# Patient Record
Sex: Female | Born: 1995 | Hispanic: No | Smoking: Current every day smoker
Health system: Southern US, Community
[De-identification: ages and names within clinical notes are randomized; demographics above are authoritative.]

## PROBLEM LIST (undated history)

## (undated) DIAGNOSIS — E162 Hypoglycemia, unspecified: Secondary | ICD-10-CM

## (undated) DIAGNOSIS — N289 Disorder of kidney and ureter, unspecified: Secondary | ICD-10-CM

## (undated) DIAGNOSIS — R0600 Dyspnea, unspecified: Secondary | ICD-10-CM

## (undated) DIAGNOSIS — N049 Nephrotic syndrome with unspecified morphologic changes: Secondary | ICD-10-CM

## (undated) DIAGNOSIS — Z8659 Personal history of other mental and behavioral disorders: Secondary | ICD-10-CM

## (undated) DIAGNOSIS — J45909 Unspecified asthma, uncomplicated: Secondary | ICD-10-CM

## (undated) DIAGNOSIS — I509 Heart failure, unspecified: Secondary | ICD-10-CM

## (undated) DIAGNOSIS — F3110 Bipolar disorder, current episode manic without psychotic features, unspecified: Secondary | ICD-10-CM

## (undated) DIAGNOSIS — I10 Essential (primary) hypertension: Secondary | ICD-10-CM

## (undated) DIAGNOSIS — F4310 Post-traumatic stress disorder, unspecified: Secondary | ICD-10-CM

## (undated) DIAGNOSIS — T8859XA Other complications of anesthesia, initial encounter: Secondary | ICD-10-CM

## (undated) DIAGNOSIS — Z992 Dependence on renal dialysis: Secondary | ICD-10-CM

## (undated) DIAGNOSIS — G709 Myoneural disorder, unspecified: Secondary | ICD-10-CM

## (undated) DIAGNOSIS — R011 Cardiac murmur, unspecified: Secondary | ICD-10-CM

## (undated) DIAGNOSIS — N186 End stage renal disease: Secondary | ICD-10-CM

## (undated) DIAGNOSIS — R079 Chest pain, unspecified: Secondary | ICD-10-CM

## (undated) DIAGNOSIS — G4700 Insomnia, unspecified: Secondary | ICD-10-CM

## (undated) HISTORY — PX: OTHER SURGICAL HISTORY: SHX169

## (undated) HISTORY — PX: CHOLECYSTECTOMY: SHX55

## (undated) HISTORY — PX: RENAL BIOPSY: SHX156

---

## 2017-04-08 ENCOUNTER — Emergency Department (HOSPITAL_COMMUNITY)
Admission: EM | Admit: 2017-04-08 | Discharge: 2017-04-09 | Disposition: A | Discharge status: Home / Self Care | Payer: Self-pay | Attending: Emergency Medicine | Admitting: Emergency Medicine

## 2017-04-08 ENCOUNTER — Encounter (HOSPITAL_COMMUNITY): Payer: Self-pay | Admitting: Emergency Medicine

## 2017-04-08 DIAGNOSIS — F17200 Nicotine dependence, unspecified, uncomplicated: Secondary | ICD-10-CM | POA: Insufficient documentation

## 2017-04-08 DIAGNOSIS — F319 Bipolar disorder, unspecified: Secondary | ICD-10-CM

## 2017-04-08 DIAGNOSIS — R45851 Suicidal ideations: Secondary | ICD-10-CM | POA: Insufficient documentation

## 2017-04-08 DIAGNOSIS — F3131 Bipolar disorder, current episode depressed, mild: Secondary | ICD-10-CM | POA: Insufficient documentation

## 2017-04-08 DIAGNOSIS — F1410 Cocaine abuse, uncomplicated: Secondary | ICD-10-CM | POA: Diagnosis present

## 2017-04-08 HISTORY — DX: Hypoglycemia, unspecified: E16.2

## 2017-04-08 HISTORY — DX: Nephrotic syndrome with unspecified morphologic changes: N04.9

## 2017-04-08 HISTORY — DX: Insomnia, unspecified: G47.00

## 2017-04-08 HISTORY — DX: Post-traumatic stress disorder, unspecified: F43.10

## 2017-04-08 HISTORY — DX: Bipolar disorder, current episode manic without psychotic features, unspecified: F31.10

## 2017-04-08 LAB — I-STAT BETA HCG BLOOD, ED (MC, WL, AP ONLY): I-stat hCG, quantitative: <5 m[IU]/mL

## 2017-04-08 LAB — CBC
HCT: 41.2 % (ref 36.0–46.0)
Hemoglobin: 14 g/dL (ref 12.0–15.0)
MCH: 30.7 pg (ref 26.0–34.0)
MCHC: 34 g/dL (ref 30.0–36.0)
MCV: 90.4 fL (ref 78.0–100.0)
PLATELETS: 222 10*3/uL (ref 150–400)
RBC: 4.56 MIL/uL (ref 3.87–5.11)
RDW: 13.2 % (ref 11.5–15.5)
WBC: 14.6 10*3/uL — ABNORMAL HIGH (ref 4.0–10.5)
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
## 2017-04-08 NOTE — ED Triage Notes (Signed)
Pt states she has had thoughts of suicide but states she has no plan to act on them

## 2017-04-09 DIAGNOSIS — F1410 Cocaine abuse, uncomplicated: Secondary | ICD-10-CM | POA: Diagnosis present

## 2017-04-09 DIAGNOSIS — F3131 Bipolar disorder, current episode depressed, mild: Secondary | ICD-10-CM | POA: Diagnosis present

## 2017-04-09 LAB — COMPREHENSIVE METABOLIC PANEL
ALBUMIN: 3.5 g/dL (ref 3.5–5.0)
ALK PHOS: 45 U/L (ref 38–126)
ALT: 19 U/L (ref 14–54)
AST: 23 U/L (ref 15–41)
Anion gap: 8 (ref 5–15)
BUN: 11 mg/dL (ref 6–20)
CALCIUM: 9 mg/dL (ref 8.9–10.3)
CHLORIDE: 107 mmol/L (ref 101–111)
CO2: 25 mmol/L (ref 22–32)
CREATININE: 1.24 mg/dL — AB (ref 0.44–1.00)
GFR calc Af Amer: >60 mL/min (ref >60)
GFR calc non Af Amer: >60 mL/min (ref >60)
Glucose, Bld: 127 mg/dL — ABNORMAL HIGH (ref 65–99)
Potassium: 3.6 mmol/L (ref 3.5–5.1)
SODIUM: 140 mmol/L (ref 135–145)
Total Bilirubin: 0.4 mg/dL (ref 0.3–1.2)
Total Protein: 6.6 g/dL (ref 6.5–8.1)

## 2017-04-09 LAB — RAPID URINE DRUG SCREEN, HOSP PERFORMED
Amphetamines: NOT DETECTED
BENZODIAZEPINES: NOT DETECTED
Barbiturates: NOT DETECTED
COCAINE: POSITIVE — AB
Opiates: NOT DETECTED
TETRAHYDROCANNABINOL: POSITIVE — AB

## 2017-04-09 LAB — ETHANOL: Alcohol, Ethyl (B): <5 mg/dL (ref <5)

## 2017-04-09 LAB — ACETAMINOPHEN LEVEL: Acetaminophen (Tylenol), Serum: <10 ug/mL — ABNORMAL LOW (ref 10–30)

## 2017-04-09 LAB — SALICYLATE LEVEL

## 2017-04-09 MED ORDER — SERTRALINE HCL 50 MG PO TABS
25.0000 mg | ORAL_TABLET | Freq: Every day | ORAL | Status: DC
  Administered 2017-04-09: 25 mg via ORAL
  Filled 2017-04-09: qty 1

## 2017-04-09 MED ORDER — TRAZODONE HCL 100 MG PO TABS: 100.0000 mg | ORAL_TABLET | Freq: Every day | ORAL | 0 refills | Status: DC

## 2017-04-09 MED ORDER — ARIPIPRAZOLE 2 MG PO TABS: 2.0000 mg | ORAL_TABLET | Freq: Every day | ORAL | 0 refills | Status: DC

## 2017-04-09 MED ORDER — ACETAMINOPHEN 325 MG PO TABS: 650.0000 mg | ORAL_TABLET | ORAL | Status: DC | PRN

## 2017-04-09 MED ORDER — TRAZODONE HCL 100 MG PO TABS: 100.0000 mg | ORAL_TABLET | Freq: Every day | ORAL | Status: DC

## 2017-04-09 MED ORDER — HYDROXYZINE HCL 25 MG PO TABS
25.0000 mg | ORAL_TABLET | Freq: Four times a day (QID) | ORAL | Status: DC | PRN
  Administered 2017-04-09: 25 mg via ORAL
  Filled 2017-04-09: qty 1

## 2017-04-09 MED ORDER — HYDROMORPHONE HCL 1 MG/ML IJ SOLN: 2.0000 mg | INTRAMUSCULAR | Status: DC

## 2017-04-09 MED ORDER — ONDANSETRON HCL 4 MG PO TABS: 4.0000 mg | ORAL_TABLET | Freq: Three times a day (TID) | ORAL | Status: DC | PRN

## 2017-04-09 MED ORDER — ARIPIPRAZOLE 2 MG PO TABS
2.0000 mg | ORAL_TABLET | Freq: Every day | ORAL | Status: DC
  Administered 2017-04-09: 2 mg via ORAL
  Filled 2017-04-09: qty 1

## 2017-04-09 MED ORDER — ALUM & MAG HYDROXIDE-SIMETH 200-200-20 MG/5ML PO SUSP: 30.0000 mL | ORAL | Status: DC | PRN

## 2017-04-09 MED ORDER — IBUPROFEN 200 MG PO TABS: 600.0000 mg | ORAL_TABLET | Freq: Three times a day (TID) | ORAL | Status: DC | PRN

## 2017-04-09 MED ORDER — SERTRALINE HCL 25 MG PO TABS: 25.0000 mg | ORAL_TABLET | Freq: Every day | ORAL | 0 refills | Status: DC

## 2017-04-09 MED ORDER — ZOLPIDEM TARTRATE 5 MG PO TABS: 5.0000 mg | ORAL_TABLET | Freq: Every evening | ORAL | Status: DC | PRN

## 2017-04-09 MED ORDER — HYDROXYZINE HCL 25 MG PO TABS: 25.0000 mg | ORAL_TABLET | Freq: Four times a day (QID) | ORAL | 0 refills | Status: DC | PRN

## 2017-04-09 NOTE — ED Notes (Signed)
Bed: [facility]ALB Expected date:  Expected time:  Means of arrival:  Comments: 

## 2017-04-09 NOTE — BHH Counselor (Signed)
@  0400: Clinician attempted to re-engage the pt in the TTS consult. Pt was still unable to rouse. Updated Dr. Roxanne Mins.   Edd Fabian, MS, [facility] Triage Specialist [phone]

## 2017-04-09 NOTE — Discharge Instructions (Addendum)
For your ongoing mental health needs, you are advised to follow up with [facility].  New and returning patients are seen at their walk-in clinic.  Walk-in hours are Monday - Friday from 8:00 am - 3:00 pm.  Walk-in patients are seen on a first come, first served basis.  Try to arrive as early as possible for he best chance of being seen the same day:       [facility]      [address], [place] [address]      [phone]  For your shelter needs, contact the following service providers:       [facility]      [place], [place] [address]      [phone]       [facility]      [place]      [place], [place] [address]      [phone]  For day shelter and other supportive services for the homeless, contact the [facility]):       [facility]      [place], [place] [address]      [phone]  For transitional housing, contact one of the following agencies.  They provide longer term housing than a shelter, but there is an application process:       [facility]      [address]      [place], [place] [address]      [phone]       [facility] [address], [place] [address]      [phone]

## 2017-04-09 NOTE — ED Provider Notes (Signed)
[facility] DEPT Provider Note   CSN: [id number] Arrival date & time: 04/08/17  2301     History   Chief Complaint Chief Complaint  Patient presents with  . Medical Clearance    HPI Natasha Long is a 21 y.o. female.  HPI Patient presents to the emergency department with worsening bipolar disorder since she has not been taking her medicines the last few months.  Patient states that she feels like she is wanting to inflict physical harm to help with her mental pain.  The patient states that she has not had any homicidal thoughts.  The patient states that nothing seems make the condition better or worse.  She states that she has had similar problems when she stopped taking her medications in the past.  Patient denies hallucinationsThe patient denies chest pain, shortness of breath, headache,blurred vision, neck pain, fever, cough, weakness, numbness, dizziness, anorexia, edema, abdominal pain, nausea, vomiting, diarrhea, rash, back pain, dysuria, hematemesis, bloody stool, near syncope, or syncope. Past Medical History:  Diagnosis Date  . Bipolar affective, manic ([place])   . Hypoglycemia   . Insomnia   . Nephrotic syndrome   . PTSD (post-traumatic stress disorder)     There are no active problems to display for this patient.   Past Surgical History:  Procedure Laterality Date  . CHOLECYSTECTOMY    . extraction of wisdom teeth      OB History    No data available       Home Medications    Prior to Admission medications   Not on File    Family History Family History  Problem Relation Age of Onset  . Diabetes Other   . Hypertension Other     Social History Social History  Substance Use Topics  . Smoking status: Current Every Day Smoker  . Smokeless tobacco: Never Used  . Alcohol use No     Allergies   Prednisone   Review of Systems Review of Systems  All other systems negative except as documented in the HPI. All pertinent positives and  negatives as reviewed in the HPI. Physical Exam Updated Vital Signs BP 118/77 (BP Location: Left Arm)   Pulse 87   Temp 98.6 F (37 C) (Oral)   Resp 18   LMP 03/15/2017 (Exact Date)   SpO2 99%   Physical Exam  Constitutional: She is oriented to person, place, and time. She appears well-developed and well-nourished. No distress.  HENT:  Head: Normocephalic and atraumatic.  Mouth/Throat: Oropharynx is clear and moist.  Eyes: Pupils are equal, round, and reactive to light.  Neck: Normal range of motion. Neck supple.  Cardiovascular: Normal rate, regular rhythm and normal heart sounds.  Exam reveals no gallop and no friction rub.   No murmur heard. Pulmonary/Chest: Effort normal and breath sounds normal. No respiratory distress. She has no wheezes.  Abdominal: Soft. Bowel sounds are normal. She exhibits no distension. There is no tenderness.  Neurological: She is alert and oriented to person, place, and time. She exhibits normal muscle tone. Coordination normal.  Skin: Skin is warm and dry. Capillary refill takes less than 2 seconds. No rash noted. No erythema.  Psychiatric: She has a normal mood and affect. Her behavior is normal.  Nursing note and vitals reviewed.    ED Treatments / Results  Labs (all labs ordered are listed, but only abnormal results are displayed) Labs Reviewed  CBC - Abnormal; Notable for the following:       Result Value  WBC 14.6 (*)    All other components within normal limits  COMPREHENSIVE METABOLIC PANEL  ETHANOL  SALICYLATE LEVEL  ACETAMINOPHEN LEVEL  RAPID URINE DRUG SCREEN, HOSP PERFORMED  I-STAT BETA HCG BLOOD, ED ([facility], [facility], [facility] ONLY)    EKG  EKG Interpretation None       Radiology No results found.  Procedures Procedures (including critical care time)  Medications Ordered in ED Medications - No data to display   Initial Impression / Assessment and Plan / ED Course  I have reviewed the triage vital signs and the nursing  notes.  Pertinent labs & imaging results that were available during my care of the patient were reviewed by me and considered in my medical decision making (see chart for details).     Patient will need TTS assessment for passive suicidal ideation, along with her bipolar disorder  Final Clinical Impressions(s) / ED Diagnoses   Final diagnoses:  None    New Prescriptions New Prescriptions   No medications on file     Dalia Heading, PA-C 04/09/17 [place], [place], PA-C 87/19/59 [id number]    Delora Fuel, MD 85/50/15 [phone]

## 2017-04-09 NOTE — BH Assessment (Signed)
[facility] Assessment Progress Note  Per Corena Pilgrim, MD, this pt does not require psychiatric hospitalization at this time.  Pt is to be discharged from [facility] with recommendation to follow up with [facility].  She is also to be provided with information about supportive services for the homeless.  These have been included in pt's discharge instructions.  [name] nurse has been notified.  Jalene Mullet, [facility] Triage Specialist [phone]

## 2017-04-09 NOTE — ED Notes (Signed)
Pt has been moved to hall B and 3 bags of belongings were placed in cabinet for [place] B.

## 2017-04-09 NOTE — BHH Counselor (Signed)
Clinician attempted to complete the TTS consult however pt could not be roused. Clinician asked the pt's nurse to assist however pt continue to sleep. Clinician will attempt the assessment later.   Edd Fabian, MS, [facility], [facility] Triage Specialist [phone]
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
## 2017-04-09 NOTE — ED Notes (Signed)
Discharge instructions reviewed with patient. Patient verbalized understanding. Patient given clothes from chaplain.

## 2017-05-02 ENCOUNTER — Encounter (HOSPITAL_COMMUNITY): Payer: Self-pay | Admitting: Emergency Medicine

## 2017-05-02 ENCOUNTER — Emergency Department (HOSPITAL_COMMUNITY): Payer: Self-pay

## 2017-05-02 ENCOUNTER — Emergency Department (HOSPITAL_COMMUNITY)
Admission: EM | Admit: 2017-05-02 | Discharge: 2017-05-03 | Disposition: A | Discharge status: Home / Self Care | Payer: Self-pay | Attending: Emergency Medicine | Admitting: Emergency Medicine

## 2017-05-02 DIAGNOSIS — S0511XA Contusion of eyeball and orbital tissues, right eye, initial encounter: Secondary | ICD-10-CM | POA: Insufficient documentation

## 2017-05-02 DIAGNOSIS — Z79899 Other long term (current) drug therapy: Secondary | ICD-10-CM | POA: Insufficient documentation

## 2017-05-02 DIAGNOSIS — S0512XA Contusion of eyeball and orbital tissues, left eye, initial encounter: Secondary | ICD-10-CM | POA: Insufficient documentation

## 2017-05-02 DIAGNOSIS — Y939 Activity, unspecified: Secondary | ICD-10-CM | POA: Insufficient documentation

## 2017-05-02 DIAGNOSIS — Y999 Unspecified external cause status: Secondary | ICD-10-CM | POA: Insufficient documentation

## 2017-05-02 DIAGNOSIS — Y929 Unspecified place or not applicable: Secondary | ICD-10-CM | POA: Insufficient documentation

## 2017-05-02 DIAGNOSIS — S060X0A Concussion without loss of consciousness, initial encounter: Secondary | ICD-10-CM | POA: Insufficient documentation

## 2017-05-02 DIAGNOSIS — F17200 Nicotine dependence, unspecified, uncomplicated: Secondary | ICD-10-CM | POA: Insufficient documentation

## 2017-05-02 LAB — URINALYSIS, ROUTINE W REFLEX MICROSCOPIC
Bilirubin Urine: NEGATIVE
GLUCOSE, UA: NEGATIVE mg/dL
KETONES UR: NEGATIVE mg/dL
Leukocytes, UA: NEGATIVE
Nitrite: POSITIVE — AB
PH: 5 (ref 5.0–8.0)
Specific Gravity, Urine: 1.031 — ABNORMAL HIGH (ref 1.005–1.030)

## 2017-05-02 LAB — RAPID URINE DRUG SCREEN, HOSP PERFORMED
AMPHETAMINES: NOT DETECTED
BENZODIAZEPINES: NOT DETECTED
Barbiturates: NOT DETECTED
Cocaine: NOT DETECTED
OPIATES: NOT DETECTED
TETRAHYDROCANNABINOL: POSITIVE — AB

## 2017-05-02 LAB — PREGNANCY, URINE: PREG TEST UR: NEGATIVE
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
## 2017-05-02 NOTE — ED Notes (Signed)
Pt aware we need a urine specimen. 

## 2017-05-03 MED ORDER — ARIPIPRAZOLE 2 MG PO TABS: 2.0000 mg | ORAL_TABLET | Freq: Every day | ORAL | 0 refills | Status: DC

## 2017-05-03 MED ORDER — SERTRALINE HCL 25 MG PO TABS: 25.0000 mg | ORAL_TABLET | Freq: Every day | ORAL | 0 refills | Status: DC

## 2017-05-03 MED ORDER — HYDROXYZINE HCL 25 MG PO TABS: 25.0000 mg | ORAL_TABLET | Freq: Four times a day (QID) | ORAL | 0 refills | Status: DC

## 2017-05-03 NOTE — ED Notes (Signed)
Per Dr. Alvino Chapel, pt is to board in the ED until social work can see pt in the am.

## 2017-05-03 NOTE — ED Notes (Signed)
ED Provider at bedside. 

## 2017-05-03 NOTE — Discharge Instructions (Addendum)
Restart your prescription medication. Follow-up with community mental health resources.

## 2017-05-03 NOTE — ED Notes (Signed)
Social Worker at bedside.
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
## 2017-05-03 NOTE — ED Provider Notes (Signed)
Patient reassessed by Education officer, museum. It was recommended that she restart her medications and follow-up with community mental health. Prescriptions written for Abilify, trazodone, hydroxyzine, Zoloft. Patient was lucid at discharge.  No evidence of psychosis.   Nat Christen, MD 05/03/17 1020

## 2017-05-05 LAB — URINE CULTURE: Culture: >=100000 — AB

## 2017-05-06 ENCOUNTER — Telehealth: Payer: Self-pay | Admitting: *Deleted
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
## 2017-05-06 NOTE — Telephone Encounter (Signed)
Post ED Visit - Positive Culture Follow-up  Culture report reviewed by antimicrobial stewardship pharmacist:  []  Elenor Quinones, Pharm.D. []  Heide Guile, Pharm.D., BCPS AQ-ID []  Parks Neptune, Pharm.D., BCPS []  Alycia Rossetti, Pharm.D., BCPS []  [place], [place].D., BCPS, AAHIVP []  Legrand Como, Pharm.D., BCPS, AAHIVP []  Salome Arnt, PharmD, BCPS []  Dimitri Ped, PharmD, BCPS []  Vincenza Hews, PharmD, BCPS  Positive urine culture, chart reviewed by Quintella Reichert, [facility] No urinary symptoms and no further patient follow-up is required at this time.  Harlon Flor [facility] 05/06/2017, 10:48 AM

## 2017-05-07 ENCOUNTER — Encounter (HOSPITAL_COMMUNITY): Payer: Self-pay | Admitting: Emergency Medicine

## 2017-05-07 ENCOUNTER — Emergency Department (HOSPITAL_COMMUNITY)
Admission: EM | Admit: 2017-05-07 | Discharge: 2017-05-08 | Disposition: A | Discharge status: Home / Self Care | Payer: Medicaid - Out of State | Attending: Emergency Medicine | Admitting: Emergency Medicine

## 2017-05-07 DIAGNOSIS — R609 Edema, unspecified: Secondary | ICD-10-CM

## 2017-05-07 DIAGNOSIS — R809 Proteinuria, unspecified: Secondary | ICD-10-CM | POA: Insufficient documentation

## 2017-05-07 DIAGNOSIS — F17200 Nicotine dependence, unspecified, uncomplicated: Secondary | ICD-10-CM | POA: Insufficient documentation

## 2017-05-07 DIAGNOSIS — R600 Localized edema: Secondary | ICD-10-CM | POA: Insufficient documentation

## 2017-05-07 DIAGNOSIS — Z79899 Other long term (current) drug therapy: Secondary | ICD-10-CM | POA: Insufficient documentation

## 2017-05-07 DIAGNOSIS — N390 Urinary tract infection, site not specified: Secondary | ICD-10-CM

## 2017-05-07 LAB — URINALYSIS, ROUTINE W REFLEX MICROSCOPIC
Bilirubin Urine: NEGATIVE
GLUCOSE, UA: NEGATIVE mg/dL
Ketones, ur: NEGATIVE mg/dL
NITRITE: NEGATIVE
Protein, ur: 100 mg/dL — AB
SPECIFIC GRAVITY, URINE: 1.019 (ref 1.005–1.030)
pH: 6 (ref 5.0–8.0)

## 2017-05-07 LAB — POC URINE PREG, ED: Preg Test, Ur: NEGATIVE

## 2017-05-07 MED ORDER — NITROFURANTOIN MONOHYD MACRO 100 MG PO CAPS
100.0000 mg | ORAL_CAPSULE | Freq: Once | ORAL | Status: AC
  Administered 2017-05-08: 100 mg via ORAL
  Filled 2017-05-07: qty 1

## 2017-05-07 NOTE — ED Triage Notes (Signed)
Per EMS, pt complains of right flank pain x 3 days and dysuria. Pt also complains of swelling feet.   BP 142.84 HR 78 CBG 107

## 2017-05-07 NOTE — ED Provider Notes (Signed)
[facility] DEPT Provider Note   CSN: [id number] Arrival date & time: 05/07/17  2257  By signing my name below, I, Ny'Kea Lewis, attest that this documentation has been prepared under the direction and in the presence of Delora Fuel, MD. Electronically Signed: Lise Auer, ED Scribe. 05/08/17. 12:03 AM.  History   Chief Complaint Chief Complaint  Patient presents with  . Flank Pain   The history is provided by the patient. No language interpreter was used.   HPI Comments: Natasha Long is a 21 y.o. female brought in by ambulance, who presents to the Emergency Department complaining of gradually worsening, sharp right flank pain that began three days ago, she rates the severity of her pain a 6/10. Pt notes associated BLE edema, nausea, straining to urinate, and urinary frequency and urgency, . Pt reports three days ago she began to feel sharp right flank pain and she noted BLE edema. She notes she was dx with nephrotic syndrome but has not been followed by a nephrologist since she was 21 years old. She notes her pain is worsened with twisting positional movement. Denies fever, chills, diaphoresis, emesis, or myalgias.   Past Medical History:  Diagnosis Date  . Bipolar affective, manic ([place])   . Hypoglycemia   . Insomnia   . Nephrotic syndrome   . PTSD (post-traumatic stress disorder)     Patient Active Problem List   Diagnosis Date Noted  . Bipolar affective disorder, depressed, mild ([place]) 04/09/2017  . Cocaine abuse 04/09/2017    Past Surgical History:  Procedure Laterality Date  . CHOLECYSTECTOMY    . extraction of wisdom teeth      OB History    No data available      Home Medications    Prior to Admission medications   Medication Sig Start Date End Date Taking? Authorizing Provider  ARIPiprazole (ABILIFY) 2 MG tablet Take 1 tablet (2 mg total) by mouth daily. 05/03/17   Nat Christen, MD  hydrOXYzine (ATARAX/VISTARIL) 25 MG tablet Take 1 tablet (25 mg total) by  mouth every 6 (six) hours. 05/03/17   Nat Christen, MD  sertraline (ZOLOFT) 25 MG tablet Take 1 tablet (25 mg total) by mouth daily. 05/03/17   Nat Christen, MD  traZODone (DESYREL) 100 MG tablet Take 1 tablet (100 mg total) by mouth at bedtime. 04/09/17   Patrecia Pour, NP    Family History Family History  Problem Relation Age of Onset  . Diabetes Other   . Hypertension Other    Social History Social History  Substance Use Topics  . Smoking status: Current Every Day Smoker  . Smokeless tobacco: Never Used  . Alcohol use No   Allergies   Prednisone  Review of Systems Review of Systems  Constitutional: Negative for chills, diaphoresis and fever.  Cardiovascular: Positive for leg swelling.  Gastrointestinal: Positive for nausea. Negative for vomiting.  Genitourinary: Positive for difficulty urinating (straining to urinate. ), flank pain and frequency.  Musculoskeletal: Negative for myalgias.  All other systems reviewed and are negative.  Physical Exam Updated Vital Signs BP 134/79 (BP Location: Right Arm)   Pulse 69   Temp 98.1 F (36.7 C) (Oral)   Resp 16   Ht 5\' 8"  (1.727 m)   Wt 250 lb (113.4 kg)   LMP 03/30/2017   SpO2 100%   BMI 38.01 kg/m   Physical Exam  Constitutional: She is oriented to person, place, and time. She appears well-developed and well-nourished.  HENT:  Head: Normocephalic and  atraumatic.  Eyes: EOM are normal. Pupils are equal, round, and reactive to light.  Neck: Normal range of motion. Neck supple. No JVD present.  Cardiovascular: Normal rate, regular rhythm and normal heart sounds.   No murmur heard. Pulmonary/Chest: Effort normal and breath sounds normal. She has no wheezes. She has no rales. She exhibits no tenderness.  Abdominal: Soft. Bowel sounds are normal. She exhibits no distension and no mass. There is no tenderness.  Musculoskeletal: Normal range of motion. She exhibits no edema.  2-3+ pre-tibial edema.   Lymphadenopathy:    She has  no cervical adenopathy.  Neurological: She is alert and oriented to person, place, and time. No cranial nerve deficit. She exhibits normal muscle tone. Coordination normal.  Skin: Skin is warm and dry. No rash noted.  Psychiatric: She has a normal mood and affect. Her behavior is normal. Judgment and thought content normal.  Nursing note and vitals reviewed.  ED Treatments / Results  DIAGNOSTIC STUDIES: Oxygen Saturation is 100% on RA, normal by my interpretation.   COORDINATION OF CARE: 11:54 PM-Discussed next steps with pt. Pt verbalized understanding and is agreeable with the plan.   Labs (all labs ordered are listed, but only abnormal results are displayed) Labs Reviewed  URINALYSIS, ROUTINE W REFLEX MICROSCOPIC - Abnormal; Notable for the following:       Result Value   Hgb urine dipstick MODERATE (*)    Protein, ur 100 (*)    Leukocytes, UA SMALL (*)    Bacteria, UA RARE (*)    Squamous Epithelial / LPF 0-5 (*)    All other components within normal limits  POC URINE PREG, ED    Procedures Procedures (including critical care time)  Medications Ordered in ED Medications  nitrofurantoin (macrocrystal-monohydrate) (MACROBID) capsule 100 mg (100 mg Oral Given 05/08/17 0018)     Initial Impression / Assessment and Plan / ED Course  I have reviewed the triage vital signs and the nursing notes.  Pertinent lab results that were available during my care of the patient were reviewed by me and considered in my medical decision making (see chart for details).  Peripheral edema in patient with history of nephrotic syndrome. This is worrisome for recurrence. Old records are reviewed, and she was in the ED June 3 and had a urine culture that was positive for Escherichia coli which was sensitive to all tested antibiotics. She is given a dose of nitrofurantoin. Urinalysis today only shows protein at 100 mg/dL. Metabolic panel shows albumin only slightly low at 3.4. Renal function is  normal. I do not believe that this represents nephrotic syndrome. Exact cause for her edema is not clear. She is discharged with prescription for furosemide and nitrofurantoin. Also, of note, she has moved here from [place] and will need to have her Medicaid reassigned. She is given Doctor, hospital.  Final Clinical Impressions(s) / ED Diagnoses   Final diagnoses:  Peripheral edema  Urinary tract infection without hematuria, site unspecified  Proteinuria, unspecified type    New Prescriptions New Prescriptions   FUROSEMIDE (LASIX) 20 MG TABLET    Take 1 tablet (20 mg total) by mouth daily as needed for edema.   NITROFURANTOIN, MACROCRYSTAL-MONOHYDRATE, (MACROBID) 100 MG CAPSULE    Take 1 capsule (100 mg total) by mouth 2 (two) times daily. X 7 days   I personally performed the services described in this documentation, which was scribed in my presence. The recorded information has been reviewed and is accurate.  Delora Fuel, MD 68/25/74 [phone]

## 2017-05-08 LAB — CBC WITH DIFFERENTIAL/PLATELET
BASOS ABS: 0 10*3/uL (ref 0.0–0.1)
Basophils Relative: 0 %
Eosinophils Absolute: 0.3 10*3/uL (ref 0.0–0.7)
Eosinophils Relative: 2 %
HEMATOCRIT: 39.2 % (ref 36.0–46.0)
HEMOGLOBIN: 13.6 g/dL (ref 12.0–15.0)
LYMPHS PCT: 33 %
Lymphs Abs: 3.5 10*3/uL (ref 0.7–4.0)
MCH: 30.7 pg (ref 26.0–34.0)
MCHC: 34.7 g/dL (ref 30.0–36.0)
MCV: 88.5 fL (ref 78.0–100.0)
MONO ABS: 0.8 10*3/uL (ref 0.1–1.0)
MONOS PCT: 7 %
NEUTROS ABS: 6.1 10*3/uL (ref 1.7–7.7)
NEUTROS PCT: 58 %
Platelets: 195 10*3/uL (ref 150–400)
RBC: 4.43 MIL/uL (ref 3.87–5.11)
RDW: 13.2 % (ref 11.5–15.5)
WBC: 10.7 10*3/uL — ABNORMAL HIGH (ref 4.0–10.5)

## 2017-05-08 LAB — COMPREHENSIVE METABOLIC PANEL
ALBUMIN: 3.4 g/dL — AB (ref 3.5–5.0)
ALT: 21 U/L (ref 14–54)
ANION GAP: 7 (ref 5–15)
AST: 17 U/L (ref 15–41)
Alkaline Phosphatase: 49 U/L (ref 38–126)
BUN: 16 mg/dL (ref 6–20)
CO2: 28 mmol/L (ref 22–32)
Calcium: 9.2 mg/dL (ref 8.9–10.3)
Chloride: 106 mmol/L (ref 101–111)
Creatinine, Ser: 0.97 mg/dL (ref 0.44–1.00)
GFR calc Af Amer: >60 mL/min (ref >60)
GFR calc non Af Amer: >60 mL/min (ref >60)
GLUCOSE: 82 mg/dL (ref 65–99)
POTASSIUM: 3.7 mmol/L (ref 3.5–5.1)
SODIUM: 141 mmol/L (ref 135–145)
TOTAL PROTEIN: 6.5 g/dL (ref 6.5–8.1)
Total Bilirubin: 0.3 mg/dL (ref 0.3–1.2)

## 2017-05-08 MED ORDER — FUROSEMIDE 20 MG PO TABS: 20.0000 mg | ORAL_TABLET | Freq: Every day | ORAL | 0 refills | Status: DC | PRN

## 2017-05-08 MED ORDER — NITROFURANTOIN MONOHYD MACRO 100 MG PO CAPS: 100.0000 mg | ORAL_CAPSULE | Freq: Two times a day (BID) | ORAL | 0 refills | Status: DC

## 2017-05-18 ENCOUNTER — Encounter (HOSPITAL_COMMUNITY): Payer: Self-pay | Admitting: Emergency Medicine

## 2017-05-18 ENCOUNTER — Emergency Department (HOSPITAL_COMMUNITY)
Admission: EM | Admit: 2017-05-18 | Discharge: 2017-05-19 | Disposition: A | Discharge status: Home / Self Care | Payer: Medicaid - Out of State | Attending: Emergency Medicine | Admitting: Emergency Medicine

## 2017-05-18 ENCOUNTER — Emergency Department (HOSPITAL_COMMUNITY): Payer: Medicaid - Out of State

## 2017-05-18 DIAGNOSIS — W101XXA Fall (on)(from) sidewalk curb, initial encounter: Secondary | ICD-10-CM | POA: Insufficient documentation

## 2017-05-18 DIAGNOSIS — S93402A Sprain of unspecified ligament of left ankle, initial encounter: Secondary | ICD-10-CM | POA: Insufficient documentation

## 2017-05-18 DIAGNOSIS — Y929 Unspecified place or not applicable: Secondary | ICD-10-CM | POA: Insufficient documentation

## 2017-05-18 DIAGNOSIS — Y9301 Activity, walking, marching and hiking: Secondary | ICD-10-CM | POA: Insufficient documentation

## 2017-05-18 DIAGNOSIS — Y998 Other external cause status: Secondary | ICD-10-CM | POA: Insufficient documentation

## 2017-05-18 DIAGNOSIS — I10 Essential (primary) hypertension: Secondary | ICD-10-CM | POA: Insufficient documentation

## 2017-05-18 DIAGNOSIS — F17210 Nicotine dependence, cigarettes, uncomplicated: Secondary | ICD-10-CM | POA: Insufficient documentation

## 2017-05-18 DIAGNOSIS — Z79899 Other long term (current) drug therapy: Secondary | ICD-10-CM | POA: Insufficient documentation

## 2017-05-18 HISTORY — DX: Essential (primary) hypertension: I10
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
## 2017-05-19 MED ORDER — IBUPROFEN 200 MG PO TABS
600.0000 mg | ORAL_TABLET | Freq: Once | ORAL | Status: AC
  Administered 2017-05-19: 600 mg via ORAL
  Filled 2017-05-19: qty 3

## 2017-05-19 MED ORDER — IBUPROFEN 600 MG PO TABS: 600.0000 mg | ORAL_TABLET | Freq: Four times a day (QID) | ORAL | 0 refills | Status: DC | PRN
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
## 2017-05-19 NOTE — Discharge Instructions (Signed)
Ibuprofen as needed for pain. Ice and alleviate for additional pain relief. If your symptoms persist without improvement in one week, please follow up with your primary care doctor.    TREATMENT  Rest, ice, elevation, and compression are the basic modes of treatment.    HOME CARE INSTRUCTIONS  Apply ice to the sore area for 15 to 20 minutes, 3 to 4 times per day. Do this while you are awake for the first 2 days. This can be stopped when the swelling goes away. Put the ice in a plastic bag and place a towel between the bag of ice and your skin.  Keep your leg elevated when possible to lessen swelling.  You may take off your ankle stabilizer at night and to take a shower or bath. Wiggle your toes several times per day if you are able.  ACTIVITY:            - Weight bearing as tolerated - if you can do it, do it. If it hurts, stop.             - Exercises should be limited to pain free range of motion            - Can start mobilization by tracing the alphabet with your foot in the air.       SEEK MEDICAL CARE IF:  You have an increase in bruising, swelling, or pain.  Your toes feel cold.  Pain relief is not achieved with medications.  EMERGENCY:: Your toes are numb or blue or you have severe pain.   MAKE SURE YOU:  Understand these instructions.  Will watch your condition.  Will get help right away if you are not doing well or get worse

## 2017-05-25 MED FILL — NITROFURANTOIN MONO-MCR 100: 100 | 7 days supply | Qty: 14 | Fill #0

## 2017-05-25 MED FILL — IBUPROFEN 600 MG TABLET: 600 | 8 days supply | Qty: 30 | Fill #0

## 2017-05-25 MED FILL — FUROSEMIDE 20 MG TABLET: 20 | 15 days supply | Qty: 15 | Fill #0

## 2017-07-11 ENCOUNTER — Emergency Department (HOSPITAL_COMMUNITY)
Admission: EM | Admit: 2017-07-11 | Discharge: 2017-07-11 | Disposition: A | Discharge status: Home / Self Care | Payer: Medicaid - Out of State | Attending: Emergency Medicine | Admitting: Emergency Medicine

## 2017-07-11 ENCOUNTER — Emergency Department (HOSPITAL_COMMUNITY): Payer: Medicaid - Out of State

## 2017-07-11 ENCOUNTER — Encounter (HOSPITAL_COMMUNITY): Payer: Self-pay

## 2017-07-11 DIAGNOSIS — Y929 Unspecified place or not applicable: Secondary | ICD-10-CM | POA: Insufficient documentation

## 2017-07-11 DIAGNOSIS — S82892A Other fracture of left lower leg, initial encounter for closed fracture: Secondary | ICD-10-CM | POA: Insufficient documentation

## 2017-07-11 DIAGNOSIS — Y9389 Activity, other specified: Secondary | ICD-10-CM | POA: Insufficient documentation

## 2017-07-11 DIAGNOSIS — F17210 Nicotine dependence, cigarettes, uncomplicated: Secondary | ICD-10-CM | POA: Insufficient documentation

## 2017-07-11 DIAGNOSIS — W1842XA Slipping, tripping and stumbling without falling due to stepping into hole or opening, initial encounter: Secondary | ICD-10-CM | POA: Insufficient documentation

## 2017-07-11 DIAGNOSIS — Z79899 Other long term (current) drug therapy: Secondary | ICD-10-CM | POA: Insufficient documentation

## 2017-07-11 DIAGNOSIS — I10 Essential (primary) hypertension: Secondary | ICD-10-CM | POA: Insufficient documentation

## 2017-07-11 DIAGNOSIS — Y999 Unspecified external cause status: Secondary | ICD-10-CM | POA: Insufficient documentation

## 2017-07-11 MED ORDER — TRAMADOL HCL 50 MG PO TABS: 50.0000 mg | ORAL_TABLET | Freq: Four times a day (QID) | ORAL | 0 refills | Status: DC | PRN

## 2017-07-11 MED ORDER — TRAMADOL HCL 50 MG PO TABS
50.0000 mg | ORAL_TABLET | Freq: Once | ORAL | Status: AC
  Administered 2017-07-11: 50 mg via ORAL
  Filled 2017-07-11: qty 1

## 2017-07-11 MED ORDER — DICLOFENAC SODIUM 50 MG PO TBEC: 50.0000 mg | DELAYED_RELEASE_TABLET | Freq: Two times a day (BID) | ORAL | 0 refills | Status: DC

## 2017-07-11 NOTE — ED Triage Notes (Signed)
Pt BIB [facility] c/o L ankle pain following twisting her ankle yesterday. Pt is able to bear weight on ankle, but endorses swelling. PMS and pulses intact. EMS splinted ankle. A&Ox4.

## 2017-07-11 NOTE — ED Provider Notes (Signed)
[facility] DEPT Provider Note   CSN: [id number] Arrival date & time: 07/11/17  1323   By signing my name below, I, Eunice Blase, attest that this documentation has been prepared under the direction and in the presence of [facility], [place]. Electronically signed, Eunice Blase, ED Scribe. 07/11/17. 1:36 PM.   History   Chief Complaint Chief Complaint  Patient presents with  . Ankle Injury   The history is provided by the patient and medical records. No language interpreter was used.    Natasha Long is a 21 y.o. female who arrived via EMS to the Emergency Department concerning L ankle pain onset yesterday. Associated swelling that is worsening gradually. She states she rolled the ankle stepping in a pothole and she heard multiple popping sounds during the injury. She describes constant, moderate throbbing and aching worse with ambulation. Ibuprofen has provided minimal relief at home. Pt states she rolls the ankle > x 2 yearly, and notes she has weak ankles. No other complaints at this time.   Past Medical History:  Diagnosis Date  . Bipolar affective, manic ([place])   . Hypertension   . Hypoglycemia   . Insomnia   . Nephrotic syndrome   . PTSD (post-traumatic stress disorder)     Patient Active Problem List   Diagnosis Date Noted  . Bipolar affective disorder, depressed, mild ([place]) 04/09/2017  . Cocaine abuse 04/09/2017    Past Surgical History:  Procedure Laterality Date  . CHOLECYSTECTOMY    . extraction of wisdom teeth    . RENAL BIOPSY      OB History    No data available       Home Medications    Prior to Admission medications   Medication Sig Start Date End Date Taking? Authorizing Provider  etonogestrel (NEXPLANON) 68 MG IMPL implant 1 each by Subdermal route once.   Yes [provider]  hydrOXYzine (ATARAX/VISTARIL) 25 MG tablet Take 1 tablet (25 mg total) by mouth every 6 (six) hours. Patient taking differently: Take 25 mg by mouth every  6 (six) hours as needed for anxiety.  05/03/17  Yes Nat Christen, MD  lisinopril (PRINIVIL,ZESTRIL) 2.5 MG tablet Take 2.5 mg by mouth daily.   Yes [provider]  ARIPiprazole (ABILIFY) 2 MG tablet Take 1 tablet (2 mg total) by mouth daily. 05/03/17   Nat Christen, MD  diclofenac (VOLTAREN) 50 MG EC tablet Take 1 tablet (50 mg total) by mouth 2 (two) times daily. 07/11/17   Ashley Murrain, NP  sertraline (ZOLOFT) 25 MG tablet Take 1 tablet (25 mg total) by mouth daily. Patient not taking: Reported on 07/11/2017 05/03/17   Nat Christen, MD  traMADol (ULTRAM) 50 MG tablet Take 1 tablet (50 mg total) by mouth every 6 (six) hours as needed. 07/11/17   Ashley Murrain, NP  traZODone (DESYREL) 100 MG tablet Take 1 tablet (100 mg total) by mouth at bedtime. Patient not taking: Reported on 07/11/2017 04/09/17   Patrecia Pour, NP    Family History Family History  Problem Relation Age of Onset  . Diabetes Other   . Hypertension Other     Social History Social History  Substance Use Topics  . Smoking status: Current Every Day Smoker    Types: Cigarettes  . Smokeless tobacco: Never Used  . Alcohol use No     Allergies   Prednisone; Prozac [fluoxetine hcl]; and Wellbutrin [bupropion]   Review of Systems Review of Systems  Constitutional: Negative for fever.  HENT: Negative.   Gastrointestinal: Negative for nausea and vomiting.  Musculoskeletal: Positive for arthralgias and joint swelling.  Skin: Negative for color change and wound.  Neurological: Negative for weakness and numbness.  Psychiatric/Behavioral: The patient is not nervous/anxious.      Physical Exam Updated Vital Signs BP (!) 124/59 (BP Location: Left Arm)   Pulse 65   Temp 99.3 F (37.4 C) (Oral)   Resp 15   Ht 5\' 8"  (1.727 m)   Wt 250 lb (113.4 kg)   LMP 07/07/2017   SpO2 100%   BMI 38.01 kg/m   Physical Exam  Constitutional: She appears well-developed and well-nourished. No distress.  HENT:  Head:  Normocephalic and atraumatic.  Eyes: EOM are normal.  Neck: Neck supple.  Cardiovascular: Normal rate.   Pulmonary/Chest: Effort normal.  Musculoskeletal:       Left ankle: She exhibits swelling. She exhibits no deformity, no laceration and normal pulse. Decreased range of motion: due to pain. Tenderness. Lateral malleolus and medial malleolus tenderness found. Achilles tendon normal.  Pedal pulses 2+. Adequate circulation. Swelling to lateral aspect of L ankle and also to the medial aspect. Limited ROM d/t pain. Plantar/ dorsi flexion without difficulty.  Neurological: She is alert.  Skin: Skin is warm and dry.  Psychiatric: She has a normal mood and affect. Her behavior is normal.  Nursing note and vitals reviewed.    ED Treatments / Results  DIAGNOSTIC STUDIES: Oxygen Saturation is 100% on RA, NL by my interpretation.    COORDINATION OF CARE: 1:34 PM-Discussed next steps with pt. Pt verbalized understanding and is agreeable with the plan. Will order imaging.   Labs (all labs ordered are listed, but only abnormal results are displayed) Labs Reviewed - No data to display Radiology Dg Ankle Complete Left  Result Date: 07/11/2017 CLINICAL DATA:  Left ankle pain and swelling following a twisting injury yesterday. EXAM: LEFT ANKLE COMPLETE - 3+ VIEW COMPARISON:  Left foot dated 04-Jun-2017. FINDINGS: Pronounced diffuse soft tissue swelling. Possible small effusion. Small corticated ossicle posterior to the talus, not visualized on the previous lateral view due to different centering. Small linear density at the inferior aspect of the lateral malleolus. Otherwise, no fracture or dislocation seen. There is a small distal medial malleolar spur and small spur arising from the adjacent talus. IMPRESSION: 1. Possible small linear avulsion fracture off the distal aspect of the lateral malleolus. 2. Possible small effusion. 3. Pronounced diffuse soft tissue swelling. 4. Mild talotibial degenerative  change. Electronically Signed   By: Claudie Revering M.D.   On: 07/11/2017 14:23    Procedures Procedures (including critical care time)  Medications Ordered in ED Medications  traMADol (ULTRAM) tablet 50 mg (50 mg Oral Given 07/11/17 1436)   21 y.o. female with left ankle pain s/p fall yesterday stable for d/c without focal neuro deficits. X-ray shows tiny avulsion fracture off the distal aspect of the lateral malleolus. Cam walker, pain management and f/u with ortho.  X-rays reviewed and case discussed with Dr. Tyrone Nine.   Initial Impression / Assessment and Plan / ED Course  I have reviewed the triage vital signs and the nursing notes.  Pertinent imaging results that were available during my care of the patient were reviewed by me and considered in my medical decision making (see chart for details).   Final Clinical Impressions(s) / ED Diagnoses   Final diagnoses:  Ankle fracture, left, closed, initial encounter    New Prescriptions Discharge Medication List as of 07/11/2017  3:05 PM    START taking these medications   Details  diclofenac (VOLTAREN) 50 MG EC tablet Take 1 tablet (50 mg total) by mouth 2 (two) times daily., Starting Sun 07/11/2017, Print    traMADol (ULTRAM) 50 MG tablet Take 1 tablet (50 mg total) by mouth every 6 (six) hours as needed., Starting Sun 07/11/2017, Print      I personally performed the services described in this documentation, which was scribed in my presence. The recorded information has been reviewed and is accurate.    Debroah Baller [place], [place] 07/11/17 [place], [place], DO 07/12/17 1628

## 2017-07-11 NOTE — Discharge Instructions (Signed)
Your x-ray shows a tiny chip on the ankle bone. This is usually treated like a bad sprain. You may also have ligament injury. Follow up with the orthopedic doctor. Return here as needed.

## 2017-07-11 NOTE — ED Notes (Signed)
Bed: WTR6 Expected date:  Expected time:  Means of arrival:  Comments: 21 yo ankle injury

## 2017-07-12 MED FILL — traMADol HCL 50 MG TABS: 50 | 4 days supply | Qty: 15 | Fill #0

## 2017-07-12 MED FILL — DICLOFENAC SOD EC 50 MG TAB: 50 | 7 days supply | Qty: 15 | Fill #0

## 2017-07-13 ENCOUNTER — Encounter: Payer: Self-pay | Admitting: Pediatric Intensive Care

## 2017-07-27 ENCOUNTER — Encounter: Payer: Self-pay | Admitting: Pediatric Intensive Care

## 2017-07-27 NOTE — Congregational Nurse Program (Signed)
Congregational Nurse Program Note  Date of Encounter: 07/13/2017  Past Medical History: Past Medical History:  Diagnosis Date  . Bipolar affective, manic ([place])   . Hypertension   . Hypoglycemia   . Insomnia   . Nephrotic syndrome   . PTSD (post-traumatic stress disorder)     Encounter Details:     CNP Questionnaire - 07/13/17 0915      Patient Demographics   Is this a new or existing patient? New   Patient is considered a/an Not Applicable   Race African-American/Black     Patient Assistance   Location of Patient Assistance [facility]   Patient's financial/insurance status Self-Pay (Uninsured)   Uninsured Patient ([place] Card/Care Connects) Yes   Interventions Follow-up/Education/Support provided after completed appt.   Patient referred to apply for the following financial assistance [employer] insecurities addressed Not Applicable   Transportation assistance No   Assistance securing medications Yes   Type of Assistance [facility]   Educational health offerings Medications     Encounter Details   Primary purpose of visit Acute Illness/Condition Visit   Was an Emergency Department visit averted? Not Applicable   Does patient have a medical provider? Yes   Patient referred to Follow up with established PCP   Was a mental health screening completed? (GAINS tool) No   Does patient have dental issues? No   Does patient have vision issues? No   Does your patient have an abnormal blood pressure today? No   Since previous encounter, have you referred patient for abnormal blood pressure that resulted in a new diagnosis or medication change? No   Does your patient have an abnormal blood glucose today? No   Since previous encounter, have you referred patient for abnormal blood glucose that resulted in a new diagnosis or medication change? No   Was there a life-saving intervention made? No    Client s/p left ankle fracture. She was seen in the ED on 8/12 and  has prescriptions for medication. CN will fill at [facility].

## 2017-07-27 NOTE — Congregational Nurse Program (Signed)
Congregational Nurse Program Note  Date of Encounter: 07/27/2017  Past Medical History: Past Medical History:  Diagnosis Date  . Bipolar affective, manic ([place])   . Hypertension   . Hypoglycemia   . Insomnia   . Nephrotic syndrome   . PTSD (post-traumatic stress disorder)     Encounter Details:     CNP Questionnaire - 07/27/17 0930      Patient Demographics   Is this a new or existing patient? Existing   Patient is considered a/an Not Applicable   Race African-American/Black     Patient Assistance   Location of Patient Assistance [facility]   Patient's financial/insurance status Self-Pay (Uninsured)   Uninsured Patient ([facility] Oncologist) Yes   Interventions Not Applicable   Patient referred to apply for the following financial assistance [employer] insecurities addressed Not Research scientist (physical sciences) Yes   Type of Assistance [employer] Given   Assistance securing medications No   Type of Forensic scientist the healthcare system     Encounter Details   Primary purpose of visit Navigating the Healthcare System;Education/Health Concerns   Was an Emergency Department visit averted? Not Applicable   Does patient have a medical provider? Yes   Patient referred to Follow up with established PCP   Was a mental health screening completed? (GAINS tool) No   Does patient have dental issues? No   Does patient have vision issues? No   Does your patient have an abnormal blood pressure today? No   Since previous encounter, have you referred patient for abnormal blood pressure that resulted in a new diagnosis or medication change? No   Does your patient have an abnormal blood glucose today? No   Since previous encounter, have you referred patient for abnormal blood glucose that resulted in a new diagnosis or medication change? No   Was there a life-saving intervention made? No    Client has follow up clinic  appointment tomorrow at [facility]. She needs to get [employer] in order to apply for housing voucher. CN gave bus passes for medical appointment and advised client on [place] appointments at [place].

## 2017-07-29 ENCOUNTER — Encounter (HOSPITAL_COMMUNITY): Payer: Self-pay | Admitting: Emergency Medicine

## 2017-07-29 DIAGNOSIS — Z79899 Other long term (current) drug therapy: Secondary | ICD-10-CM | POA: Insufficient documentation

## 2017-07-29 DIAGNOSIS — R109 Unspecified abdominal pain: Secondary | ICD-10-CM | POA: Insufficient documentation

## 2017-07-29 DIAGNOSIS — I10 Essential (primary) hypertension: Secondary | ICD-10-CM | POA: Insufficient documentation

## 2017-07-29 DIAGNOSIS — F17210 Nicotine dependence, cigarettes, uncomplicated: Secondary | ICD-10-CM | POA: Insufficient documentation

## 2017-07-29 NOTE — ED Triage Notes (Signed)
Pt brought in by EMS  Pt was picked up at the bus stop with c/o bilateral flank pain that started about 2 hrs ago  Pt has chronic kidney disease  Pt has hx of kidney infections the last one was about 6 mths ago  Pt is c/o feeling like she is going to pass out

## 2017-07-30 ENCOUNTER — Emergency Department (HOSPITAL_COMMUNITY)
Admission: EM | Admit: 2017-07-30 | Discharge: 2017-07-30 | Disposition: A | Discharge status: Home / Self Care | Payer: Medicaid - Out of State | Attending: Emergency Medicine | Admitting: Emergency Medicine

## 2017-07-30 DIAGNOSIS — R109 Unspecified abdominal pain: Secondary | ICD-10-CM

## 2017-07-30 LAB — I-STAT CHEM 8, ED
BUN: 14 mg/dL (ref 6–20)
CREATININE: 1 mg/dL (ref 0.44–1.00)
Calcium, Ion: 1.14 mmol/L — ABNORMAL LOW (ref 1.15–1.40)
Chloride: 103 mmol/L (ref 101–111)
Glucose, Bld: 122 mg/dL — ABNORMAL HIGH (ref 65–99)
HEMATOCRIT: 41 % (ref 36.0–46.0)
Hemoglobin: 13.9 g/dL (ref 12.0–15.0)
Potassium: 3.6 mmol/L (ref 3.5–5.1)
Sodium: 142 mmol/L (ref 135–145)
TCO2: 27 mmol/L (ref 22–32)

## 2017-07-30 LAB — URINALYSIS, ROUTINE W REFLEX MICROSCOPIC
Bilirubin Urine: NEGATIVE
GLUCOSE, UA: NEGATIVE mg/dL
HGB URINE DIPSTICK: NEGATIVE
Ketones, ur: 20 mg/dL — AB
Leukocytes, UA: NEGATIVE
Nitrite: NEGATIVE
Protein, ur: >=300 mg/dL — AB
SPECIFIC GRAVITY, URINE: 1.031 — AB (ref 1.005–1.030)
pH: 5 (ref 5.0–8.0)

## 2017-07-30 LAB — PREGNANCY, URINE: PREG TEST UR: NEGATIVE

## 2017-07-30 MED ORDER — ACETAMINOPHEN 325 MG PO TABS
650.0000 mg | ORAL_TABLET | Freq: Once | ORAL | Status: AC
  Administered 2017-07-30: 650 mg via ORAL
  Filled 2017-07-30: qty 2

## 2017-07-30 NOTE — ED Provider Notes (Signed)
[facility] DEPT Provider Note   CSN: [id number] Arrival date & time: 07/29/17  2133     History   Chief Complaint Chief Complaint  Patient presents with  . Flank Pain    HPI Natasha Long is a 21 y.o. female.  HPI   21 year old female with history of nephrotic syndrome, bipolar, hypertension, PTSDbrought here via EMS for evaluation of flank pain. Patient reported approximately 4 hours ago she developed acute pain across her low back. Pain is described as a sharp sensation, with both sides of flank and felt similar to prior kidney infection.she endorsed mild chills and some mild nausea. Pain is currently 7 out of 10. States she has history of chronic kidney disease. She denies any specific treatment tried. She denies having fever, chest pain, shortness of breath, productive cough, dysuria, hematuria, vaginal bleeding or vaginal discharge. Denies any recent strenuous activities or heavy lifting. she is asking for a sandwich.  Last menstrual period was at the beginning of this month.  Past Medical History:  Diagnosis Date  . Bipolar affective, manic ([place])   . Hypertension   . Hypoglycemia   . Insomnia   . Nephrotic syndrome   . PTSD (post-traumatic stress disorder)     Patient Active Problem List   Diagnosis Date Noted  . Bipolar affective disorder, depressed, mild ([place]) 04/09/2017  . Cocaine abuse 04/09/2017    Past Surgical History:  Procedure Laterality Date  . CHOLECYSTECTOMY    . extraction of wisdom teeth    . RENAL BIOPSY      OB History    No data available       Home Medications    Prior to Admission medications   Medication Sig Start Date End Date Taking? Authorizing Provider  ARIPiprazole (ABILIFY) 2 MG tablet Take 1 tablet (2 mg total) by mouth daily. 05/03/17   Nat Christen, MD  diclofenac (VOLTAREN) 50 MG EC tablet Take 1 tablet (50 mg total) by mouth 2 (two) times daily. 07/11/17   Ashley Murrain, NP  etonogestrel (NEXPLANON) 68 MG IMPL implant  1 each by Subdermal route once.    [provider]  hydrOXYzine (ATARAX/VISTARIL) 25 MG tablet Take 1 tablet (25 mg total) by mouth every 6 (six) hours. Patient taking differently: Take 25 mg by mouth every 6 (six) hours as needed for anxiety.  05/03/17   Nat Christen, MD  lisinopril (PRINIVIL,ZESTRIL) 2.5 MG tablet Take 2.5 mg by mouth daily.    [provider]  sertraline (ZOLOFT) 25 MG tablet Take 1 tablet (25 mg total) by mouth daily. Patient not taking: Reported on 07/11/2017 05/03/17   Nat Christen, MD  traMADol (ULTRAM) 50 MG tablet Take 1 tablet (50 mg total) by mouth every 6 (six) hours as needed. 07/11/17   Ashley Murrain, NP  traZODone (DESYREL) 100 MG tablet Take 1 tablet (100 mg total) by mouth at bedtime. Patient not taking: Reported on 07/11/2017 04/09/17   Patrecia Pour, NP    Family History Family History  Problem Relation Age of Onset  . Diabetes Other   . Hypertension Other     Social History Social History  Substance Use Topics  . Smoking status: Current Every Day Smoker    Types: Cigarettes  . Smokeless tobacco: Never Used  . Alcohol use No     Allergies   Prednisone; Prozac [fluoxetine hcl]; and Wellbutrin [bupropion]   Review of Systems Review of Systems  All other systems reviewed and are negative.  Physical Exam Updated Vital Signs BP 131/78 (BP Location: Left Arm)   Pulse 81   Temp 98.2 F (36.8 C) (Oral)   Resp 18   LMP 07/12/2017 (Exact Date)   SpO2 99%   Physical Exam  Constitutional: She is oriented to person, place, and time. She appears well-developed and well-nourished. No distress.  Obese female resting comfortably in bed in no acute discomfort  HENT:  Head: Atraumatic.  Eyes: Conjunctivae are normal.  Neck: Neck supple.  Cardiovascular: Normal rate, regular rhythm and intact distal pulses.   Pulmonary/Chest: Effort normal and breath sounds normal.  Abdominal: Soft. Bowel sounds are normal. She exhibits no  distension. There is no tenderness.  Musculoskeletal: She exhibits tenderness (mild tenderness across lumbar paraspinal muscle at the level of L1-L3 on palpation without any overlying skin changes. No CVA tenderness).  Neurological: She is alert and oriented to person, place, and time.  Skin: No rash noted.  Psychiatric: She has a normal mood and affect.  Nursing note and vitals reviewed.    ED Treatments / Results  Labs (all labs ordered are listed, but only abnormal results are displayed) Labs Reviewed  URINALYSIS, ROUTINE W REFLEX MICROSCOPIC - Abnormal; Notable for the following:       Result Value   Color, Urine AMBER (*)    APPearance CLOUDY (*)    Specific Gravity, Urine 1.031 (*)    Ketones, ur 20 (*)    Protein, ur >=300 (*)    Bacteria, UA RARE (*)    Squamous Epithelial / LPF 6-30 (*)    All other components within normal limits  PREGNANCY, URINE  I-STAT CHEM 8, ED    EKG  EKG Interpretation None       Radiology No results found.  Procedures Procedures (including critical care time)  Medications Ordered in ED Medications - No data to display   Initial Impression / Assessment and Plan / ED Course  I have reviewed the triage vital signs and the nursing notes.  Pertinent labs & imaging results that were available during my care of the patient were reviewed by me and considered in my medical decision making (see chart for details).     BP (!) 110/59 (BP Location: Left Arm)   Pulse 66   Temp 98.1 F (36.7 C) (Oral)   Resp 18   LMP 07/12/2017 (Exact Date)   SpO2 99%    Final Clinical Impressions(s) / ED Diagnoses   Final diagnoses:  Flank pain    New Prescriptions New Prescriptions   No medications on file   3:36 AM Patient here with pain to the bilateral flank and back that started earlier today. She felt that the pain is similar to prior kidney infection. She has no urinary symptoms. Her urine today shows no signs of urinary tract  infection. Mild ketones, 300 protein noted. Her pregnancy test is negative. An I-STAT Chem-8 was obtained showing normal renal function. Low suspicion for kidney stones, pyelonephritis, or other acute emergent medical condition. At this time patient is stable for discharge. Return precaution discussed.   Domenic Moras, PA-C 07/30/17 [place], [place], MD 07/30/17 [phone]

## 2017-08-15 ENCOUNTER — Encounter (HOSPITAL_COMMUNITY): Payer: Self-pay | Admitting: Emergency Medicine

## 2017-08-15 ENCOUNTER — Emergency Department (HOSPITAL_COMMUNITY)
Admission: EM | Admit: 2017-08-15 | Discharge: 2017-08-16 | Disposition: A | Discharge status: Other Acute Inpatient Hospital | Payer: Medicaid - Out of State | Attending: Emergency Medicine | Admitting: Emergency Medicine

## 2017-08-15 DIAGNOSIS — Z008 Encounter for other general examination: Secondary | ICD-10-CM

## 2017-08-15 DIAGNOSIS — Z046 Encounter for general psychiatric examination, requested by authority: Secondary | ICD-10-CM | POA: Insufficient documentation

## 2017-08-15 DIAGNOSIS — F1910 Other psychoactive substance abuse, uncomplicated: Secondary | ICD-10-CM | POA: Insufficient documentation

## 2017-08-15 DIAGNOSIS — R45851 Suicidal ideations: Secondary | ICD-10-CM | POA: Insufficient documentation

## 2017-08-15 DIAGNOSIS — I10 Essential (primary) hypertension: Secondary | ICD-10-CM | POA: Insufficient documentation

## 2017-08-15 DIAGNOSIS — D72829 Elevated white blood cell count, unspecified: Secondary | ICD-10-CM | POA: Insufficient documentation

## 2017-08-15 DIAGNOSIS — F17210 Nicotine dependence, cigarettes, uncomplicated: Secondary | ICD-10-CM | POA: Insufficient documentation

## 2017-08-15 DIAGNOSIS — Z720 Tobacco use: Secondary | ICD-10-CM

## 2017-08-15 DIAGNOSIS — Z9114 Patient's other noncompliance with medication regimen: Secondary | ICD-10-CM | POA: Insufficient documentation

## 2017-08-15 DIAGNOSIS — F339 Major depressive disorder, recurrent, unspecified: Secondary | ICD-10-CM

## 2017-08-15 DIAGNOSIS — F3131 Bipolar disorder, current episode depressed, mild: Secondary | ICD-10-CM | POA: Insufficient documentation

## 2017-08-15 DIAGNOSIS — Z79899 Other long term (current) drug therapy: Secondary | ICD-10-CM | POA: Insufficient documentation

## 2017-08-15 LAB — CBC WITH DIFFERENTIAL/PLATELET
Basophils Absolute: 0 10*3/uL (ref 0.0–0.1)
Basophils Relative: 0 %
EOS ABS: 0 10*3/uL (ref 0.0–0.7)
EOS PCT: 0 %
HCT: 42.2 % (ref 36.0–46.0)
HEMOGLOBIN: 14.6 g/dL (ref 12.0–15.0)
LYMPHS ABS: 2.4 10*3/uL (ref 0.7–4.0)
LYMPHS PCT: 15 %
MCH: 30.9 pg (ref 26.0–34.0)
MCHC: 34.6 g/dL (ref 30.0–36.0)
MCV: 89.4 fL (ref 78.0–100.0)
MONOS PCT: 4 %
Monocytes Absolute: 0.7 10*3/uL (ref 0.1–1.0)
Neutro Abs: 12.5 10*3/uL — ABNORMAL HIGH (ref 1.7–7.7)
Neutrophils Relative %: 81 %
PLATELETS: 261 10*3/uL (ref 150–400)
RBC: 4.72 MIL/uL (ref 3.87–5.11)
RDW: 13.4 % (ref 11.5–15.5)
WBC: 15.6 10*3/uL — ABNORMAL HIGH (ref 4.0–10.5)

## 2017-08-15 LAB — COMPREHENSIVE METABOLIC PANEL
ALK PHOS: 52 U/L (ref 38–126)
ALT: 20 U/L (ref 14–54)
ANION GAP: 9 (ref 5–15)
AST: 24 U/L (ref 15–41)
Albumin: 3.7 g/dL (ref 3.5–5.0)
BUN: 10 mg/dL (ref 6–20)
CALCIUM: 9 mg/dL (ref 8.9–10.3)
CO2: 25 mmol/L (ref 22–32)
CREATININE: 0.9 mg/dL (ref 0.44–1.00)
Chloride: 105 mmol/L (ref 101–111)
Glucose, Bld: 96 mg/dL (ref 65–99)
Potassium: 4 mmol/L (ref 3.5–5.1)
SODIUM: 139 mmol/L (ref 135–145)
Total Bilirubin: 0.5 mg/dL (ref 0.3–1.2)
Total Protein: 7.4 g/dL (ref 6.5–8.1)

## 2017-08-15 LAB — SALICYLATE LEVEL

## 2017-08-15 LAB — ETHANOL

## 2017-08-15 LAB — RAPID URINE DRUG SCREEN, HOSP PERFORMED
Amphetamines: NOT DETECTED
BARBITURATES: NOT DETECTED
Benzodiazepines: NOT DETECTED
Cocaine: NOT DETECTED
Opiates: NOT DETECTED
Tetrahydrocannabinol: POSITIVE — AB

## 2017-08-15 LAB — ACETAMINOPHEN LEVEL

## 2017-08-15 LAB — I-STAT BETA HCG BLOOD, ED (MC, WL, AP ONLY)

## 2017-08-15 MED ORDER — GABAPENTIN 300 MG PO CAPS
300.0000 mg | ORAL_CAPSULE | Freq: Three times a day (TID) | ORAL | Status: DC
  Administered 2017-08-15 – 2017-08-16 (×3): 300 mg via ORAL
  Filled 2017-08-15 (×4): qty 1

## 2017-08-15 MED ORDER — ARIPIPRAZOLE 5 MG PO TABS
2.5000 mg | ORAL_TABLET | Freq: Every day | ORAL | Status: DC
  Administered 2017-08-15 – 2017-08-16 (×2): 2.5 mg via ORAL
  Filled 2017-08-15 (×2): qty 1

## 2017-08-15 MED ORDER — TRAZODONE HCL 100 MG PO TABS
100.0000 mg | ORAL_TABLET | Freq: Every day | ORAL | Status: DC
  Administered 2017-08-15: 100 mg via ORAL
  Filled 2017-08-15: qty 1

## 2017-08-15 MED ORDER — ACETAMINOPHEN 325 MG PO TABS: 650.0000 mg | ORAL_TABLET | ORAL | Status: DC | PRN

## 2017-08-15 MED ORDER — ALUM & MAG HYDROXIDE-SIMETH 200-200-20 MG/5ML PO SUSP: 30.0000 mL | Freq: Four times a day (QID) | ORAL | Status: DC | PRN

## 2017-08-15 MED ORDER — LISINOPRIL 2.5 MG PO TABS
2.5000 mg | ORAL_TABLET | Freq: Every day | ORAL | Status: DC
  Administered 2017-08-15 – 2017-08-16 (×2): 2.5 mg via ORAL
  Filled 2017-08-15 (×2): qty 1

## 2017-08-15 MED ORDER — ONDANSETRON HCL 4 MG PO TABS: 4.0000 mg | ORAL_TABLET | Freq: Three times a day (TID) | ORAL | Status: DC | PRN

## 2017-08-15 MED ORDER — NICOTINE 21 MG/24HR TD PT24
21.0000 mg | MEDICATED_PATCH | Freq: Every day | TRANSDERMAL | Status: DC
  Administered 2017-08-15 – 2017-08-16 (×2): 21 mg via TRANSDERMAL
  Filled 2017-08-15 (×2): qty 1

## 2017-08-15 MED ORDER — HYDROXYZINE HCL 25 MG PO TABS
25.0000 mg | ORAL_TABLET | Freq: Four times a day (QID) | ORAL | Status: DC | PRN
  Administered 2017-08-15 (×2): 25 mg via ORAL
  Filled 2017-08-15 (×2): qty 1

## 2017-08-15 MED ORDER — ZOLPIDEM TARTRATE 5 MG PO TABS: 5.0000 mg | ORAL_TABLET | Freq: Every evening | ORAL | Status: DC | PRN

## 2017-08-15 MED ORDER — ESCITALOPRAM OXALATE 10 MG PO TABS
5.0000 mg | ORAL_TABLET | Freq: Every day | ORAL | Status: DC
  Administered 2017-08-15 – 2017-08-16 (×2): 5 mg via ORAL
  Filled 2017-08-15 (×2): qty 1

## 2017-08-15 NOTE — ED Notes (Signed)
Hourly rounding reveals patient in room. No complaints, stable, in no acute distress. Q15 minute rounds and monitoring via [employer] to continue. 
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
## 2017-08-15 NOTE — ED Notes (Signed)
Report to include Situation, Background, Assessment, and Recommendations received from [facility]. Patient alert, warm and dry, in no acute distress. Patient denies SI, HI, AVH and pain. Patient made aware of Q15 minute rounds and security cameras for their safety. Patient instructed to come to me with needs or concerns.

## 2017-08-15 NOTE — ED Notes (Signed)
Patient denies pain and is resting comfortably.  

## 2017-08-15 NOTE — ED Notes (Signed)
Bed: WTR5 Expected date:  Expected time:  Means of arrival:  Comments: 

## 2017-08-15 NOTE — ED Notes (Signed)
Pt to go to [facility] room 34 after speaking with [facility] RN

## 2017-08-15 NOTE — ED Notes (Signed)
Hourly rounding reveals patient sleeping in room. No complaints, stable, in no acute distress. Q15 minute rounds and monitoring via Security Cameras to continue. 

## 2017-08-15 NOTE — ED Triage Notes (Signed)
Patient is tired of everything. Patient has been doing molly all day per patient. Patient plan to kill herself is to take all her medications. Patient doesn't have a stable home right now.

## 2017-08-15 NOTE — BH Assessment (Signed)
[facility] Assessment Progress Note 08/15/17 Per Olivia Mackie at [facility],  [place] is accepted by Dr. Dola Factor and can come any time. Call report number is [phone], and room number will be given later.

## 2017-08-15 NOTE — ED Notes (Signed)
[facility] transportation unable to transport patients due to weather this evening.  Receiving hospital notified and will try for morning transport.

## 2017-08-15 NOTE — BHH Counselor (Signed)
Per Lindon Romp, NP: Patient meets criteria for inpatient treatment.  Per AC, Lavell Luster, RN: There are no available at [facility] at this time.  TTS to seek further placement.  Elvina Sidle Attending Provider, [address] PA-C, notified at [phone].

## 2017-08-15 NOTE — ED Provider Notes (Signed)
[facility] DEPT Provider Note   CSN: [id number] Arrival date & time: 08/15/17  0017     History   Chief Complaint Chief Complaint  Patient presents with  . Suicidal    HPI Natasha Long is a 21 y.o. female with a PMHx of nephrotic syndrome, bipolar disorder, HTN, insomnia, PTSD, and cocaine abuse, who presents to the ED with complaints of suicidal ideations that began today after she "relapsed" on using Molly and marijuana. She last smoked marijuana yesterday morning, and had Molly about 2 hours prior to arrival. She states that she feels anxious, hopeless, depressed, and endorses suicidal ideations with a plan to overdose or cut herself. She denies making any attempts at suicide today. She admits to being a cigarette smoker, but wants to quit. She denies HI, AVH, or alcohol use. She denies any other medical complaints at this time. She was previously under the care of [facility] for her psychiatric care. She is compliant with her Vistaril 25mg  PRN, Lexapro 5mg  QD, and Abilify 2.5mg  QD which she took around noon today. She has not taken her trazodone 100mg  QHS in 2 weeks, nor has she taken her gabapentin 300mg  TID PRN or lisinopril 2.5mg  QD in 3 weeks because she ran out. She is here voluntarily for help with her psychiatric conditions.    The history is provided by the patient and medical records. No language interpreter was used.  Mental Health Problem  Presenting symptoms: depression and suicidal thoughts   Presenting symptoms: no hallucinations and no homicidal ideas   Onset quality:  Sudden Duration:  1 day Timing:  Constant Progression:  Unchanged Chronicity:  Recurrent Context: drug abuse and noncompliance   Treatment compliance:  Some of the time Time since last psychoactive medication taken:  12 hours Relieved by:  None tried Worsened by:  Drugs Ineffective treatments:  None tried Associated symptoms: anxiety   Associated symptoms: no abdominal pain and no chest pain    Associated symptoms comment:  +hopelessness Risk factors: hx of mental illness and recent psychiatric admission     Past Medical History:  Diagnosis Date  . Bipolar affective, manic ([place])   . Hypertension   . Hypoglycemia   . Insomnia   . Nephrotic syndrome   . PTSD (post-traumatic stress disorder)     Patient Active Problem List   Diagnosis Date Noted  . Bipolar affective disorder, depressed, mild ([place]) 04/09/2017  . Cocaine abuse 04/09/2017    Past Surgical History:  Procedure Laterality Date  . CHOLECYSTECTOMY    . extraction of wisdom teeth    . RENAL BIOPSY      OB History    No data available       Home Medications    Prior to Admission medications   Medication Sig Start Date End Date Taking? Authorizing Provider  acetaminophen (TYLENOL) 325 MG tablet Take 650 mg by mouth every 6 (six) hours as needed for moderate pain.    [provider]  ARIPiprazole (ABILIFY) 2 MG tablet Take 1 tablet (2 mg total) by mouth daily. 05/03/17   Nat Christen, MD  diclofenac (VOLTAREN) 50 MG EC tablet Take 1 tablet (50 mg total) by mouth 2 (two) times daily. Patient not taking: Reported on 07/30/2017 07/11/17   Ashley Murrain, NP  etonogestrel (NEXPLANON) 68 MG IMPL implant 1 each by Subdermal route once.    [provider]  gabapentin (NEURONTIN) 300 MG capsule Take 300 mg by mouth 3 (three) times daily.    [provider]  hydrOXYzine (ATARAX/VISTARIL) 25 MG tablet Take 1 tablet (25 mg total) by mouth every 6 (six) hours. Patient taking differently: Take 25 mg by mouth every 6 (six) hours as needed for anxiety.  05/03/17   Nat Christen, MD  lisinopril (PRINIVIL,ZESTRIL) 2.5 MG tablet Take 2.5 mg by mouth daily.    [provider]  sertraline (ZOLOFT) 25 MG tablet Take 1 tablet (25 mg total) by mouth daily. Patient not taking: Reported on 07/11/2017 05/03/17   Nat Christen, MD  traMADol (ULTRAM) 50 MG tablet Take 1 tablet (50 mg total) by mouth every 6  (six) hours as needed. Patient not taking: Reported on 07/30/2017 07/11/17   Ashley Murrain, NP  traZODone (DESYREL) 100 MG tablet Take 1 tablet (100 mg total) by mouth at bedtime. Patient not taking: Reported on 07/11/2017 04/09/17   Patrecia Pour, NP    Family History Family History  Problem Relation Age of Onset  . Diabetes Other   . Hypertension Other     Social History Social History  Substance Use Topics  . Smoking status: Current Every Day Smoker    Types: Cigarettes  . Smokeless tobacco: Never Used  . Alcohol use No     Allergies   Prednisone; Prozac [fluoxetine hcl]; and Wellbutrin [bupropion]   Review of Systems Review of Systems  Constitutional: Negative for chills and fever.  Respiratory: Negative for shortness of breath.   Cardiovascular: Negative for chest pain.  Gastrointestinal: Negative for abdominal pain, constipation, diarrhea, nausea and vomiting.  Genitourinary: Negative for dysuria and hematuria.  Musculoskeletal: Negative for arthralgias and myalgias.  Skin: Negative for color change.  Allergic/Immunologic: Negative for immunocompromised state.  Neurological: Negative for weakness and numbness.  Psychiatric/Behavioral: Positive for suicidal ideas. Negative for confusion, hallucinations and homicidal ideas. The patient is nervous/anxious.    All other systems reviewed and are negative for acute change except as noted in the HPI.    Physical Exam Updated Vital Signs BP (!) 143/82 (BP Location: Left Arm)   Pulse 75   Temp 97.8 F (36.6 C) (Oral)   Resp 18   SpO2 100%   Physical Exam  Constitutional: She is oriented to person, place, and time. Vital signs are normal. She appears well-developed and well-nourished.  Non-toxic appearance. No distress.  Afebrile, nontoxic, NAD, morbidly obese  HENT:  Head: Normocephalic and atraumatic.  Mouth/Throat: Oropharynx is clear and moist and mucous membranes are normal.  Eyes: Conjunctivae and EOM are  normal. Right eye exhibits no discharge. Left eye exhibits no discharge.  Neck: Normal range of motion. Neck supple.  Cardiovascular: Normal rate, regular rhythm, normal heart sounds and intact distal pulses.  Exam reveals no gallop and no friction rub.   No murmur heard. Pulmonary/Chest: Effort normal and breath sounds normal. No respiratory distress. She has no decreased breath sounds. She has no wheezes. She has no rhonchi. She has no rales.  Abdominal: Soft. Normal appearance and bowel sounds are normal. She exhibits no distension. There is no tenderness. There is no rigidity, no rebound, no guarding, no CVA tenderness, no tenderness at McBurney's point and negative Murphy's sign.  Musculoskeletal: Normal range of motion.  Neurological: She is alert and oriented to person, place, and time. She has normal strength. No sensory deficit.  Skin: Skin is warm, dry and intact. No rash noted.  Psychiatric: She is not actively hallucinating. She exhibits a depressed mood. She expresses suicidal ideation. She expresses no homicidal ideation. She expresses suicidal plans.  She expresses no homicidal plans.  Tearful during exam, depressed affect, but pleasant and cooperative. Endorsing SI with a plan, denies HI or AVH, doesn't seem to be responding to internal stimuli.   Nursing note and vitals reviewed.    ED Treatments / Results  Labs (all labs ordered are listed, but only abnormal results are displayed) Labs Reviewed  CBC WITH DIFFERENTIAL/PLATELET - Abnormal; Notable for the following:       Result Value   WBC 15.6 (*)    Neutro Abs 12.5 (*)    All other components within normal limits  ACETAMINOPHEN LEVEL - Abnormal; Notable for the following:    Acetaminophen (Tylenol), Serum <10 (*)    All other components within normal limits  COMPREHENSIVE METABOLIC PANEL  ETHANOL  SALICYLATE LEVEL  RAPID URINE DRUG SCREEN, HOSP PERFORMED  I-STAT BETA HCG BLOOD, ED ([facility], [facility], [facility] ONLY)    EKG  EKG  Interpretation None       Radiology No results found.  Procedures Procedures (including critical care time)  Medications Ordered in ED Medications  ARIPiprazole (ABILIFY) tablet 2.5 mg (not administered)  gabapentin (NEURONTIN) capsule 300 mg (not administered)  hydrOXYzine (ATARAX/VISTARIL) tablet 25 mg (not administered)  lisinopril (PRINIVIL,ZESTRIL) tablet 2.5 mg (not administered)  traZODone (DESYREL) tablet 100 mg (not administered)  escitalopram (LEXAPRO) tablet 5 mg (not administered)  acetaminophen (TYLENOL) tablet 650 mg (not administered)  zolpidem (AMBIEN) tablet 5 mg (not administered)  ondansetron (ZOFRAN) tablet 4 mg (not administered)  alum & mag hydroxide-simeth (MAALOX/MYLANTA) 200-200-20 MG/5ML suspension 30 mL (not administered)  nicotine (NICODERM CQ - dosed in mg/24 hours) patch 21 mg (not administered)     Initial Impression / Assessment and Plan / ED Course  I have reviewed the triage vital signs and the nursing notes.  Pertinent labs & imaging results that were available during my care of the patient were reviewed by me and considered in my medical decision making (see chart for details).     21 y.o. female here with SI, depression, anxiety, hopelessness, and drug use (molly and THC). States she "relapsed" on drugs and this has caused her depression to worsen. +Smoker, cessation advised. Denies HI/AVH/EtOH use. No other medical complaints. Exam benign aside from depressed affect and pt tearful. Will get clearance labs and reassess.   3:26 AM EtOH level undetectable. CBC w/diff with mild leukocytosis which appears to be a chronic finding; doubt need for further emergent work up, no s/sx of infection on exam or based on hx. CMP WNL. Salicylate and acetaminophen levels WNL. HCG neg. UDS pending, but does not interfere with med clearance. Pt medically cleared at this time. Psych hold orders and home med orders placed. Please see TTS notes for further  documentation of care/dispo. PLEASE NOTE THAT PT IS HERE VOLUNTARILY AT THIS TIME, IF PT TRIES TO LEAVE THEY WOULD NEED IVC PAPERWORK TAKEN OUT. Pt stable at time of med clearance.     Final Clinical Impressions(s) / ED Diagnoses   Final diagnoses:  Suicidal ideation  Episode of recurrent major depressive disorder, unspecified depression episode severity ([place])  Polysubstance abuse  Tobacco user  Medical clearance for psychiatric admission  Leukocytosis, unspecified type    New Prescriptions New Prescriptions   No medications on [address] [place], [place] 08/15/17 [place], April, MD 08/15/17 [phone]

## 2017-08-15 NOTE — Progress Notes (Signed)
Per NP Romilda Garret and Psychiatrist Akintayo, patient meets inpatient criteria. TTS faxed patient's referral to: [place], [place], [place], [place], [place], [place], [place], [place], [place].  Abundio Miu, [facility]  Clinical Social Worker [phone]

## 2017-08-15 NOTE — B[facility] Suicide Risk Assessment (Signed)
Suicide Risk Assessment  Discharge Assessment   [facility] Discharge Suicide Risk Assessment   Principal Problem: Bipolar affective disorder, depressed, mild ([place]) Discharge Diagnoses:  Patient Active Problem List   Diagnosis Date Noted  . Bipolar affective disorder, depressed, mild ([place]) [F31.31] 04/09/2017  . Cocaine abuse [F14.10] 04/09/2017    Total Time spent with patient: 45 minutes  Musculoskeletal: Strength & Muscle Tone: within normal limits Gait & Station: normal Patient leans: N/A  Psychiatric Specialty Exam: Physical Exam  Constitutional: She is oriented to person, place, and time. She appears well-developed and well-nourished.  Respiratory: Effort normal.  Musculoskeletal: Normal range of motion.  Neurological: She is alert and oriented to person, place, and time.  Psychiatric: Her speech is normal. She is withdrawn. Cognition and memory are normal. She expresses impulsivity. She exhibits a depressed mood. She expresses suicidal ideation.   Review of Systems  Psychiatric/Behavioral: Positive for depression, substance abuse and suicidal ideas. Negative for hallucinations and memory loss. The patient is not nervous/anxious and does not have insomnia.   All other systems reviewed and are negative.  Blood pressure (!) 141/80, pulse (!) 58, temperature 98.2 F (36.8 C), temperature source Oral, resp. rate 16, SpO2 100 %.There is no height or weight on file to calculate BMI. General Appearance: Casual Eye Contact:  Fair Speech:  Clear and Coherent Volume:  Decreased Mood:  Depressed, Dysphoric, Hopeless and Worthless Affect:  Congruent, Depressed and Flat Thought Process:  Coherent and Linear Orientation:  Full (Time, Place, and Person) Thought Content:  Logical Suicidal Thoughts:  Yes.  without intent/plan Homicidal Thoughts:  No Memory:  Immediate;   Good Recent;   Good Remote;   Fair Judgement:  Fair Insight:  Lacking Psychomotor Activity:  Normal Concentration:   Concentration: Good and Attention Span: Good Recall:  Good Fund of Knowledge:  Good Language:  Good Akathisia:  No Handed:  Right AIMS (if indicated):    Assets:  Communication Skills Desire for Improvement Resilience ADL's:  Intact Cognition:  WNL  Mental Status Per Nursing Assessment::   On Admission:   Suicidal with a plan  Demographic Factors:  Adolescent or young adult, Low socioeconomic status and Unemployed  Loss Factors: Financial problems/change in socioeconomic status  Historical Factors: Impulsivity  Risk Reduction Factors:   Sense of responsibility to family  Continued Clinical Symptoms:  Bipolar Disorder:   Depressive phase Depression:   Hopelessness Impulsivity Alcohol/Substance Abuse/Dependencies More than one psychiatric diagnosis Previous Psychiatric Diagnoses and Treatments  Cognitive Features That Contribute To Risk:  Closed-mindedness    Suicide Risk:  Severe: Frequency and intensity of thought with a plan to overdose on her medications   Plan Of Care/Follow-up recommendations:  Activity:  as tolerated Diet:  Heart Healthy  Ethelene Hal, NP 08/15/2017, 2:50 PM
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
## 2017-08-15 NOTE — ED Notes (Signed)
Pt oriented to room and unit.  Pt is calm and cooperative.  Pt contracts for safety.  15 minute checks and video monitoring in place.

## 2017-08-16 NOTE — ED Notes (Signed)
Hourly rounding reveals patient sleeping in room. No complaints, stable, in no acute distress. Q15 minute rounds and monitoring via Security Cameras to continue. 

## 2017-08-16 NOTE — ED Notes (Signed)
Report given to Kep'el at [facility]. Transportation here to p/u pt she has signed all documents/ have all belongings. Medication administered.

## 2017-08-16 NOTE — ED Notes (Signed)
Patient denies pain and is resting comfortably.  

## 2017-08-16 NOTE — ED Notes (Signed)
Pt denies SI/HI/AVH. She is awaiting for transportation to [facility], [place], [place]. Pt noted she slept well.

## 2017-08-27 ENCOUNTER — Encounter: Payer: Self-pay | Admitting: Pediatric Intensive Care

## 2017-08-27 NOTE — Congregational Nurse Program (Signed)
Congregational Nurse Program Note  Date of Encounter: 08/27/2017  Past Medical History: Past Medical History:  Diagnosis Date  . Bipolar affective, manic ([place])   . Hypertension   . Hypoglycemia   . Insomnia   . Nephrotic syndrome   . PTSD (post-traumatic stress disorder)     Encounter Details:     CNP Questionnaire - 08/27/17 1030      Patient Demographics   Is this a new or existing patient? Existing   Patient is considered a/an Not Applicable   Race African-American/Black     Patient Assistance   Location of Patient Assistance [facility]   Patient's financial/insurance status Self-Pay (Uninsured);Medicaid   Uninsured Patient ([place] [facility]/Care Connects) No   Patient referred to apply for the following financial assistance Medicaid;[place] insecurities addressed Not Applicable   Transportation assistance No   Assistance securing medications Yes   Type of Assistance [facility];Other   Educational health offerings Medications;Navigating the healthcare system;Safety     Encounter Details   Primary purpose of visit Education/Health Concerns;Post ED/Hospitalization Visit;Navigating the Healthcare System;Safety   Was an Emergency Department visit averted? Not Applicable   Does patient have a medical provider? Yes   Patient referred to Follow up with established PCP   Was a mental health screening completed? (GAINS tool) No   Does patient have dental issues? No   Does patient have vision issues? No   Does your patient have an abnormal blood pressure today? No   Since previous encounter, have you referred patient for abnormal blood pressure that resulted in a new diagnosis or medication change? No   Does your patient have an abnormal blood glucose today? No   Since previous encounter, have you referred patient for abnormal blood glucose that resulted in a new diagnosis or medication change? No   Was there a life-saving intervention made? No    Follow up  post [facility] inpatient admission at [facility] in [place]. Client was discharged on 9/25 but was unable to pick up [facility] medication and gabapentin dues to cap on account at [facility]. With client in office, CN contacted nurse at inpatient unit to explain current situation to have [facility] provider re-prescribe medication to [facility]. CN received call back from facility to have [facility] transfer meds from [facility]. CN is unable to verify that meds are at [facility] but will submit referral to [facility]. Client has safety plan with crisis contact numbers. Client needs PCP to follow for renal issues. Client states she is transferring Medicaid to [facility] but needs additional paperwork to submit to DSS. Client will follow up with [facility] on Monday afternoon for [facility] assessment.

## 2017-08-30 ENCOUNTER — Encounter: Payer: Self-pay | Admitting: Pediatric Intensive Care

## 2017-08-31 ENCOUNTER — Encounter: Payer: Self-pay | Admitting: Pediatric Intensive Care

## 2017-09-06 ENCOUNTER — Encounter: Payer: Self-pay | Admitting: Pediatric Intensive Care

## 2017-09-15 ENCOUNTER — Encounter (INDEPENDENT_AMBULATORY_CARE_PROVIDER_SITE_OTHER): Payer: Self-pay | Admitting: Physician Assistant

## 2017-09-15 ENCOUNTER — Ambulatory Visit (INDEPENDENT_AMBULATORY_CARE_PROVIDER_SITE_OTHER): Payer: Medicaid Other | Admitting: Physician Assistant

## 2017-09-15 VITALS — BP 107/70 | HR 64 | Temp 97.5°F | Wt 300.8 lb

## 2017-09-15 DIAGNOSIS — N049 Nephrotic syndrome with unspecified morphologic changes: Secondary | ICD-10-CM

## 2017-09-15 DIAGNOSIS — R400 Somnolence: Secondary | ICD-10-CM

## 2017-09-15 DIAGNOSIS — G4700 Insomnia, unspecified: Secondary | ICD-10-CM | POA: Diagnosis not present

## 2017-09-15 DIAGNOSIS — F418 Other specified anxiety disorders: Secondary | ICD-10-CM | POA: Diagnosis not present

## 2017-09-15 DIAGNOSIS — Z23 Encounter for immunization: Secondary | ICD-10-CM

## 2017-09-15 DIAGNOSIS — M25572 Pain in left ankle and joints of left foot: Secondary | ICD-10-CM

## 2017-09-15 DIAGNOSIS — G8929 Other chronic pain: Secondary | ICD-10-CM

## 2017-09-15 LAB — POCT URINALYSIS DIPSTICK
BILIRUBIN UA: NEGATIVE
GLUCOSE UA: NEGATIVE
Ketones, UA: NEGATIVE
LEUKOCYTES UA: NEGATIVE
NITRITE UA: NEGATIVE
PH UA: 5.5 (ref 5.0–8.0)
Protein, UA: >300
Spec Grav, UA: >=1.03 — AB (ref 1.010–1.025)
UROBILINOGEN UA: 0.2 U/dL

## 2017-09-15 LAB — POCT URINE PREGNANCY: PREG TEST UR: NEGATIVE

## 2017-09-15 MED ORDER — ACETAMINOPHEN 500 MG PO TABS: 1000.0000 mg | ORAL_TABLET | Freq: Three times a day (TID) | ORAL | 0 refills | Status: AC | PRN

## 2017-09-15 MED ORDER — ESCITALOPRAM OXALATE 20 MG PO TABS: 20.0000 mg | ORAL_TABLET | Freq: Every day | ORAL | 2 refills | Status: DC

## 2017-09-15 MED ORDER — ACETAMINOPHEN 500 MG PO TABS: 1000.0000 mg | ORAL_TABLET | Freq: Three times a day (TID) | ORAL | 0 refills | Status: DC | PRN

## 2017-09-15 NOTE — Patient Instructions (Signed)
Nephrotic Syndrome Nephrotic syndrome is a set of findings that show there is a problem with the kidneys. These findings include:  High levels of protein in the urine (proteinuria).  High blood pressure (hypertension).  Low levels of the protein albumin in the blood (hypoalbuminemia).  High levels of cholesterol (hyperlipidemia) and triglycerides (hypertriglyceridemia) in the blood.  Swelling (edema) of the face, abdomen, arms, and legs.  Nephrotic syndrome occurs when the kidneys' filters (glomeruli) are damaged. Glomeruli remove toxins and waste products from the bloodstream. As a result of damaged glomeruli, essential products such as proteins may also be removed from the bloodstream. Nephrotic syndrome results from the loss of proteins and other substances that the body needs. Nephrotic syndrome may increase your risk of further kidney damage and of health problems such as blood clots and infections. What are the causes? This condition may be caused by:  A kidney disease that damages the glomeruli, such as: ? Minimal change disease. ? Focal segmental glomerulosclerosis. ? Membranous nephropathy. ? Glomerulonephritis.  A condition or disease that affects other parts of the body (systemic), such as: ? Diabetes. ? Autoimmune diseases, such as lupus. ? Amyloidosis. ? Multiple myeloma. ? Some types of cancers. ? An infection, such as hepatitis C.  Certain medicines, such as: ? Nonsteroidal anti-inflammatory drugs (NSAIDs). ? Some anticancer drugs.  In some cases, the cause may not be known. What are the signs or symptoms? Symptoms of this condition include:  Edema.  Foamy urine.  Unexplained weight gain.  Loss of appetite.  In some cases, there are no noticeable symptoms. How is this diagnosed? This condition is usually diagnosed with a dipstick urine test followed by a 24-hour urine test in which you collect all of the urine that you produce over a 24-hour period. If  your test shows that you have this condition, more tests may be needed to find the cause. These may include blood, urine, imaging, or kidney biopsy tests. How is this treated? Treatment for this condition may include medicines to control symptoms or to prevent complications from occurring. These medicines may:  Decrease inflammation in the kidneys.  Lower blood pressure.  Lower cholesterol.  Reduce the blood's ability to clot.  Help control edema.  Further treatment will depend on the cause of the condition. Your health care provider will discuss treatment options with you. Follow these instructions at home:  Follow diet instructions from your health care provider. This may include changes to help manage edema or hypertension, such as limiting your intake of salt or fluids.  Take over-the-counter and prescription medicines only as told by your health care provider. ? Do not take any new over-the-counter medicines or nutritional supplements unless approved by your health care provider. Many medicines can make this condition worse. Doses may need to be adjusted.  Keep all follow-up visits as told by your health care provider. This is important. Contact a health care provider if:  Your symptoms do not go away as expected or you develop new symptoms.  You continue to gain weight.  You have increased swelling of the feet, ankles, or legs. Get help right away if:  You stop producing urine.  You have prolonged bleeding.  You have shortness of breath.  You have a severe headache.  You have severe weakness. This information is not intended to replace advice given to you by your health care provider. Make sure you discuss any questions you have with your health care provider. Document Released: 10/09/2004 Document Revised: 11/09/2016  Document Reviewed: 11/09/2016 Elsevier Interactive Patient Education  [employer].

## 2017-09-15 NOTE — Progress Notes (Signed)
Subjective:  Patient ID: Natasha Long, female    DOB: 14-Jan-1996  Age: 21 y.o. MRN: [id number]  CC: ankle pain  HPI Natasha Long is a 21 y.o. female with a medical history of bipolar affective, HTN, insomnia, nephrotic syndrome, PTSD, borderline personality disorder, polysubstance abuse, tobacco abuse, suicidal ideation, and hyopglycemia presents with left ankle pain. Reports a fractured left ankle one month ago. Pain 8-10/10 and especially with weight bearing. Not wearing her CAM walker because she is living in a shelter and does not want to "appear weak". She no longer has the CAM walker. Radiology reports stated she had a possible avulsion fracture of the distal lateral malleolus.  Pt is very sleepy and thinks it is due to use of gabapentin for ankle pain/anxiety control.  Outpatient Medications Prior to Visit  Medication Sig Dispense Refill  . etonogestrel (NEXPLANON) 68 MG IMPL implant 1 each by Subdermal route once.    . gabapentin (NEURONTIN) 300 MG capsule Take 300 mg by mouth 3 (three) times daily.    . hydrOXYzine (ATARAX/VISTARIL) 25 MG tablet Take 1 tablet (25 mg total) by mouth every 6 (six) hours. (Patient taking differently: Take 25 mg by mouth every 6 (six) hours as needed for anxiety. ) 30 tablet 0  . lisinopril (PRINIVIL,ZESTRIL) 2.5 MG tablet Take 2.5 mg by mouth daily.    . traZODone (DESYREL) 100 MG tablet Take 1 tablet (100 mg total) by mouth at bedtime. (Patient taking differently: Take 100 mg by mouth at bedtime as needed for sleep. ) 30 tablet 0  . acetaminophen (TYLENOL) 325 MG tablet Take 650 mg by mouth every 6 (six) hours as needed for moderate pain.    . ARIPiprazole (ABILIFY) 2 MG tablet Take 1 tablet (2 mg total) by mouth daily. (Patient not taking: Reported on 09/15/2017) 30 tablet 0  . diclofenac (VOLTAREN) 50 MG EC tablet Take 1 tablet (50 mg total) by mouth 2 (two) times daily. (Patient not taking: Reported on 07/30/2017) 15 tablet 0  . sertraline  (ZOLOFT) 25 MG tablet Take 1 tablet (25 mg total) by mouth daily. (Patient not taking: Reported on 07/11/2017) 30 tablet 0  . traMADol (ULTRAM) 50 MG tablet Take 1 tablet (50 mg total) by mouth every 6 (six) hours as needed. (Patient not taking: Reported on 07/30/2017) 15 tablet 0   No facility-administered medications prior to visit.      ROS Review of Systems  Constitutional: Positive for malaise/fatigue. Negative for chills and fever.  Eyes: Negative for blurred vision.  Respiratory: Negative for shortness of breath.   Cardiovascular: Positive for leg swelling (left ankle). Negative for chest pain and palpitations.  Gastrointestinal: Negative for abdominal pain and nausea.  Genitourinary: Negative for dysuria and hematuria.  Musculoskeletal: Positive for joint pain. Negative for myalgias.  Skin: Negative for rash.  Neurological: Negative for tingling and headaches.  Psychiatric/Behavioral: Positive for depression. The patient is nervous/anxious.     Objective:  BP 107/70 (BP Location: Right Arm, Patient Position: Sitting, Cuff Size: Large)   Pulse 64   Temp (!) 97.5 F (36.4 C) (Oral)   Wt (!) 300 lb 12.8 oz (136.4 kg)   LMP 08/25/2017 (Approximate)   SpO2 98%   BMI 45.74 kg/m   BP/Weight 09/15/2017 08/16/2017 03/31/7740  Systolic BP 287 867 672  Diastolic BP 70 61 56  Wt. (Lbs) 300.8 - -  BMI 45.74 - -  Some encounter information is confidential and restricted. Go to Review Flowsheets activity to see all  data.      Physical Exam  Constitutional: She is oriented to person, place, and time.  Well developed, obesity, NAD, polite  HENT:  Head: Normocephalic and atraumatic.  Eyes: No scleral icterus.  Neck: Normal range of motion. Neck supple. No thyromegaly present.  Cardiovascular: Normal rate, regular rhythm and normal heart sounds.   Pulmonary/Chest: Effort normal and breath sounds normal.  Abdominal: Soft. Bowel sounds are normal. There is no tenderness.   Musculoskeletal: She exhibits no edema.  Neurological: She is alert and oriented to person, place, and time.  Skin: Skin is warm and dry. No rash noted. No erythema. No pallor.  Psychiatric: She has a normal mood and affect. Her behavior is normal. Thought content normal.  Vitals reviewed.    Assessment & Plan:   1. Chronic pain of left ankle - Begin acetaminophen (TYLENOL) 500 MG tablet; Take 2 tablets (1,000 mg total) by mouth every 8 (eight) hours as needed.  Dispense: 21 tablet; Refill: 0  2. Anxiety with depression - Increase escitalopram (LEXAPRO) 20 MG tablet; Take 1 tablet (20 mg total) by mouth daily.  Dispense: 30 tablet; Refill: 2 - POCT urine pregnancy - TSH  3. Nephrotic syndrome - Urinalysis Dipstick  - ANA w/Reflex - Lipid Panel - Referral to nephrology  4. Insomnia, unspecified type - Nocturnal polysomnography (NPSG); Future  5. Daytime somnolence - Nocturnal polysomnography (NPSG); Future - TSH  6. Need for prophylactic vaccination and inoculation against influenza - Flu Vaccine QUAD 6+ mos PF IM (Fluarix Quad PF)  7. Need for Tdap vaccination - Tdap vaccine greater than or equal to 7yo IM   Meds ordered this encounter  Medications  . escitalopram (LEXAPRO) 20 MG tablet    Sig: Take 1 tablet (20 mg total) by mouth daily.    Dispense:  30 tablet    Refill:  2    Order Specific Question:   Supervising Provider    Answer:   Tresa Garter [id number]  . acetaminophen (TYLENOL) 500 MG tablet    Sig: Take 2 tablets (1,000 mg total) by mouth every 8 (eight) hours as needed.    Dispense:  21 tablet    Refill:  0    Order Specific Question:   Supervising Provider    Answer:   Tresa Garter [id number]    Follow-up: Return in about 4 weeks (around 10/13/2017) for anxiety and depression.   Clent Demark PA

## 2017-09-16 ENCOUNTER — Telehealth: Payer: Self-pay

## 2017-09-16 LAB — LIPID PANEL
CHOL/HDL RATIO: 3.2 ratio (ref 0.0–4.4)
Cholesterol, Total: 169 mg/dL (ref 100–199)
HDL: 53 mg/dL (ref >39)
LDL Calculated: 77 mg/dL (ref 0–99)
Triglycerides: 196 mg/dL — ABNORMAL HIGH (ref 0–149)
VLDL Cholesterol Cal: 39 mg/dL (ref 5–40)

## 2017-09-16 LAB — TSH: TSH: 2.12 u[IU]/mL (ref 0.450–4.500)

## 2017-09-16 LAB — ANA W/REFLEX: Anti Nuclear Antibody(ANA): NEGATIVE

## 2017-09-16 NOTE — Telephone Encounter (Signed)
Patient aware of results and to get OTC fish oil pill of higher quality. Nat Christen, CMA

## 2017-09-16 NOTE — Telephone Encounter (Signed)
-----   Message from Clent Demark, PA-C sent at 09/16/2017 12:34 PM EDT ----- All labs normal except for mildly elevated triglycerides. Please advise OTC fish oil pills of high quality.

## 2017-10-05 NOTE — Congregational Nurse Program (Signed)
Congregational Nurse Program Note  Date of Encounter: 08/31/2017  Past Medical History: Past Medical History:  Diagnosis Date  . Bipolar affective, manic ([place])   . Hypertension   . Hypoglycemia   . Insomnia   . Nephrotic syndrome   . PTSD (post-traumatic stress disorder)     Encounter Details:  Clinical Intake - 09/15/17 1553      Pre-visit preparation   Pre-visit preparation completed  Yes      Abuse/Neglect   Do you feel unsafe in your current relationship?  No    Do you feel physically threatened by others?  No    Anyone hurting you at home, work, or school?  No    Unable to ask?  No      Patient Literacy   How often do you need to have someone help you when you read instructions, pamphlets, or other written materials from your doctor or pharmacy?  1 - Never    What is the last grade level you completed in school?  12th      Language Assistant   Interpreter Needed?  No      Client given information on [employer]. She will be applying for an [employer] while Medicaid for [place] is pending. Client is interested in patches for smoking cessation. CN suggests talking to provider at upcoming appointment.

## 2017-10-05 NOTE — Congregational Nurse Program (Signed)
Congregational Nurse Program Note  Date of Encounter: 08/30/2017  Past Medical History: Past Medical History:  Diagnosis Date  . Bipolar affective, manic ([place])   . Hypertension   . Hypoglycemia   . Insomnia   . Nephrotic syndrome   . PTSD (post-traumatic stress disorder)     Encounter Details:  Clinical Intake - 09/15/17 1553      Pre-visit preparation   Pre-visit preparation completed  Yes      Abuse/Neglect   Do you feel unsafe in your current relationship?  No    Do you feel physically threatened by others?  No    Anyone hurting you at home, work, or school?  No    Unable to ask?  No      Patient Literacy   How often do you need to have someone help you when you read instructions, pamphlets, or other written materials from your doctor or pharmacy?  1 - Never    What is the last grade level you completed in school?  12th      Language Assistant   Interpreter Needed?  No     BP check- client has appointment at [facility] on 10/17. States that she continues to have ankle pain from old fracture. She is in process of applying for Medicaid in [place]. CN gave client pill organizer. Client will follow up in CN clinic as needed.

## 2017-10-06 NOTE — Congregational Nurse Program (Signed)
Congregational Nurse Program Note  Date of Encounter: 09/06/2017  Past Medical History: Past Medical History:  Diagnosis Date  . Bipolar affective, manic ([place])   . Hypertension   . Hypoglycemia   . Insomnia   . Nephrotic syndrome   . PTSD (post-traumatic stress disorder)     Encounter Details:  Clinical Intake - 09/15/17 1553      Pre-visit preparation   Pre-visit preparation completed  Yes      Abuse/Neglect   Do you feel unsafe in your current relationship?  No    Do you feel physically threatened by others?  No    Anyone hurting you at home, work, or school?  No    Unable to ask?  No      Patient Literacy   How often do you need to have someone help you when you read instructions, pamphlets, or other written materials from your doctor or pharmacy?  1 - Never    What is the last grade level you completed in school?  12th      Language Assistant   Interpreter Needed?  No      BP check. Client states she has a headache but thinks it may be due to the weather change. Follow up in CN clinic as needed.

## 2017-10-07 NOTE — Congregational Nurse Program (Signed)
Congregational Nurse Program Note  Date of Encounter: 09/01/2017  Past Medical History: Past Medical History:  Diagnosis Date  . Bipolar affective, manic ([place])   . Hypertension   . Hypoglycemia   . Insomnia   . Nephrotic syndrome   . PTSD (post-traumatic stress disorder)     Encounter Details:  Clinical Intake - 09/15/17 1553      Pre-visit preparation   Pre-visit preparation completed  Yes      Abuse/Neglect   Do you feel unsafe in your current relationship?  No    Do you feel physically threatened by others?  No    Anyone hurting you at home, work, or school?  No    Unable to ask?  No      Patient Literacy   How often do you need to have someone help you when you read instructions, pamphlets, or other written materials from your doctor or pharmacy?  1 - Never    What is the last grade level you completed in school?  12th      Language Assistant   Interpreter Needed?  No      Requested counseling and assistance with obtaining orange card.  Referred to [facility] [place].  Bus passes given to go to [facility] to renew orange card

## 2017-10-08 ENCOUNTER — Encounter: Payer: Self-pay | Admitting: Pediatric Intensive Care

## 2017-10-13 ENCOUNTER — Encounter (INDEPENDENT_AMBULATORY_CARE_PROVIDER_SITE_OTHER): Payer: Self-pay | Admitting: Physician Assistant

## 2017-10-13 ENCOUNTER — Ambulatory Visit (INDEPENDENT_AMBULATORY_CARE_PROVIDER_SITE_OTHER): Payer: Medicaid Other | Admitting: Physician Assistant

## 2017-10-13 VITALS — BP 125/87 | HR 79 | Temp 98.3°F | Wt 312.8 lb

## 2017-10-13 DIAGNOSIS — F418 Other specified anxiety disorders: Secondary | ICD-10-CM

## 2017-10-13 DIAGNOSIS — J029 Acute pharyngitis, unspecified: Secondary | ICD-10-CM | POA: Diagnosis not present

## 2017-10-13 MED ORDER — ESCITALOPRAM OXALATE 20 MG PO TABS: 20.0000 mg | ORAL_TABLET | Freq: Every day | ORAL | 2 refills | Status: DC

## 2017-10-13 MED ORDER — NAPROXEN 500 MG PO TABS: 500.0000 mg | ORAL_TABLET | Freq: Two times a day (BID) | ORAL | 0 refills | Status: DC

## 2017-10-13 MED ORDER — AMOXICILLIN-POT CLAVULANATE 875-125 MG PO TABS
1.0000 | ORAL_TABLET | Freq: Two times a day (BID) | ORAL | 0 refills | Status: DC
Start: 2017-10-13 — End: 2017-12-10

## 2017-10-13 MED ORDER — BENZOCAINE-MENTHOL 15-2.6 MG MT LOZG: 1.0000 | LOZENGE | OROMUCOSAL | 0 refills | Status: DC

## 2017-10-13 NOTE — Patient Instructions (Signed)
Pharyngitis Pharyngitis is a sore throat (pharynx). There is redness, pain, and swelling of your throat. Follow these instructions at home:  Drink enough fluids to keep your pee (urine) clear or pale yellow.  Only take medicine as told by your doctor. ? You may get sick again if you do not take medicine as told. Finish your medicines, even if you start to feel better. ? Do not take aspirin.  Rest.  Rinse your mouth (gargle) with salt water ( tsp of salt per 1 qt of water) every 1-2 hours. This will help the pain.  If you are not at risk for choking, you can suck on hard candy or sore throat lozenges. Contact a doctor if:  You have large, tender lumps on your neck.  You have a rash.  You cough up green, yellow-brown, or bloody spit. Get help right away if:  You have a stiff neck.  You drool or cannot swallow liquids.  You throw up (vomit) or are not able to keep medicine or liquids down.  You have very bad pain that does not go away with medicine.  You have problems breathing (not from a stuffy nose). This information is not intended to replace advice given to you by your health care provider. Make sure you discuss any questions you have with your health care provider. Document Released: 05/04/2008 Document Revised: 04/23/2016 Document Reviewed: 07/24/2013 Elsevier Interactive Patient Education  2017 Elsevier Inc.  

## 2017-10-13 NOTE — Progress Notes (Signed)
Subjective:  Patient ID: Natasha Long, female    DOB: [birth year]  Age: 21 y.o. MRN: [id number]  CC: f/u anxiety and depression  HPI  Natasha Long is a 21 y.o. female with a medical history of bipolar affective, HTN, insomnia, nephrotic syndrome, PTSD, borderline personality disorder, polysubstance abuse, tobacco abuse, suicidal ideation, and hyopglycemia presents on f/u of anxiety and depression. Was prescribed Escitalopram 20 mg. Says she is feeling "alright". Stress level is getting better. Would like a refill of escitalopram.      Sore throat for one week. Associated with nasal congestion, sneezing, cough, "head discomfort". Does not endorse fever, chill, nausea, vomiting, rash, swelling, drooling, chest pain, palpitations, abdominal pain, diarrhea, constipation, BRBPR. Stays in a homeless shelter.     Outpatient Medications Prior to Visit  Medication Sig Dispense Refill  . escitalopram (LEXAPRO) 20 MG tablet Take 1 tablet (20 mg total) by mouth daily. 30 tablet 2  . etonogestrel (NEXPLANON) 68 MG IMPL implant 1 each by Subdermal route once.    . gabapentin (NEURONTIN) 300 MG capsule Take 300 mg by mouth 3 (three) times daily.    . hydrOXYzine (ATARAX/VISTARIL) 25 MG tablet Take 1 tablet (25 mg total) by mouth every 6 (six) hours. (Patient taking differently: Take 25 mg by mouth every 6 (six) hours as needed for anxiety. ) 30 tablet 0  . lisinopril (PRINIVIL,ZESTRIL) 2.5 MG tablet Take 2.5 mg by mouth daily.    . traZODone (DESYREL) 100 MG tablet Take 1 tablet (100 mg total) by mouth at bedtime. (Patient taking differently: Take 100 mg by mouth at bedtime as needed for sleep. ) 30 tablet 0   No facility-administered medications prior to visit.      ROS Review of Systems  Constitutional: Negative for chills, fever and malaise/fatigue.  HENT: Positive for sore throat.   Eyes: Negative for blurred vision.  Respiratory: Negative for shortness of breath.   Cardiovascular:  Negative for chest pain and palpitations.  Gastrointestinal: Negative for abdominal pain and nausea.  Genitourinary: Negative for dysuria and hematuria.  Musculoskeletal: Negative for joint pain and myalgias.  Skin: Negative for rash.  Neurological: Negative for tingling and headaches.  Psychiatric/Behavioral: Positive for depression. The patient is nervous/anxious.     Objective:  There were no vitals taken for this visit.  BP/Weight 09/15/2017 09/06/2017 81/06/2992  Systolic BP 716 967 893  Diastolic BP 70 72 78  Wt. (Lbs) 300.8 - -  BMI 45.74 - -  Some encounter information is confidential and restricted. Go to Review Flowsheets activity to see all data.      Physical Exam  Constitutional: She is oriented to person, place, and time.  Well developed, obese, NAD, polite  HENT:  Head: Normocephalic and atraumatic.  Oropharynx mildly erythematous without exudates. Turbinates erythematous and moderately hypertrophic  Eyes: Conjunctivae are normal. No scleral icterus.  Neck: Normal range of motion. Neck supple. No thyromegaly present.  Cardiovascular: Normal rate, regular rhythm and normal heart sounds.  No murmur heard. Pulmonary/Chest: Effort normal and breath sounds normal. No respiratory distress. She has no wheezes.  Musculoskeletal: She exhibits no edema.  Lymphadenopathy:    She has no cervical adenopathy.  Neurological: She is alert and oriented to person, place, and time. No cranial nerve deficit. Coordination normal.  Skin: Skin is warm and dry. No rash noted. No erythema. No pallor.  Psychiatric: She has a normal mood and affect. Her behavior is normal. Thought content normal.  Vitals reviewed.    Assessment &  Plan:    1. Depression with anxiety - Continue on Escitalopram. Seems escitalopram is reducing her depression and anxiety without any signs of patient going manic. She has a history of bipolar disease by way of ED diagnosis but no psychiatric notes are  available.   - Refill escitalopram (LEXAPRO) 20 MG tablet; Take 1 tablet (20 mg total) daily by mouth.  Dispense: 30 tablet; Refill: 2  2. Acute pharyngitis, unspecified etiology - Begin Augmentin - Begin Naproxen 500 mg BID    Meds ordered this encounter  Medications  . amoxicillin-clavulanate (AUGMENTIN) 875-125 MG tablet    Sig: Take 1 tablet 2 (two) times daily by mouth.    Dispense:  20 tablet    Refill:  0    Order Specific Question:   Supervising Provider    Answer:   Tresa Garter [id number]  . naproxen (NAPROSYN) 500 MG tablet    Sig: Take 1 tablet (500 mg total) 2 (two) times daily with a meal by mouth.    Dispense:  30 tablet    Refill:  0    Order Specific Question:   Supervising Provider    Answer:   Tresa Garter [id number]  . Benzocaine-Menthol (CEPACOL EXTRA STRENGTH) 15-2.6 MG LOZG    Sig: Use as directed 1 lozenge every 2 (two) hours in the mouth or throat.    Dispense:  60 lozenge    Refill:  0    Order Specific Question:   Supervising Provider    Answer:   Tresa Garter [id number]  . escitalopram (LEXAPRO) 20 MG tablet    Sig: Take 1 tablet (20 mg total) daily by mouth.    Dispense:  30 tablet    Refill:  2    Order Specific Question:   Supervising Provider    Answer:   Tresa Garter [id number]    Follow-up: Return in about 8 weeks (around 12/08/2017) for anxiety with depression.   Clent Demark PA

## 2017-10-28 NOTE — Congregational Nurse Program (Signed)
Congregational Nurse Program Note  Date of Encounter: 10/08/2017  Past Medical History: Past Medical History:  Diagnosis Date  . Bipolar affective, manic ([place])   . Hypertension   . Hypoglycemia   . Insomnia   . Nephrotic syndrome   . PTSD (post-traumatic stress disorder)     Encounter Details: CNP Questionnaire - 10/08/17 0900      Questionnaire   Patient Status  Not Applicable    Race  Black or African American    Location Patient [place]  Not Applicable    Uninsured  Uninsured (Subsequent visits/quarter)    Food  No food insecurities    Housing/Utilities  No permanent housing    Transportation  Provided transportation assistance (bus pass, taxi voucher, etc.);Yes, need transportation assistance    Interpersonal Safety  Yes, feel physically and emotionally safe where you currently live    Medication  No medication insecurities    Medical Provider  Yes    Referrals  Primary Care Provider/Clinic    ED Visit Averted  Not Applicable    Life-Saving Intervention Made  Not Applicable      Clinical Intake - 10/13/17 1506      Pre-visit preparation   Pre-visit preparation completed  Yes      Pain   Pain   No/denies pain    Pain Score  8       Functional Status   Activities of Daily Living  Independent    Ambulation  Independent    Medication Administration  Independent    Home Management  Independent      Abuse/Neglect   Do you feel unsafe in your current relationship?  No    Do you feel physically threatened by others?  No    Anyone hurting you at home, work, or school?  No    Unable to ask?  No      Investment banker, operational Needed?  No     Client states she is concerned about increased ankle swelling as she is out of lisinopril. Client was unable to say if PCP discontinued lisinopril. Client has pitting edema of feet and ankles. This is a recurrent problem. Client also states that the PCP started her on Lexipro and was concerned about  the starting dose. CN reassured client that dose was appropriate. Client states that she has PCP appointment next week and was encouraged to discuss medication concerns with PCP.

## 2017-12-10 ENCOUNTER — Encounter: Payer: Self-pay | Admitting: Obstetrics and Gynecology

## 2017-12-10 ENCOUNTER — Ambulatory Visit (INDEPENDENT_AMBULATORY_CARE_PROVIDER_SITE_OTHER): Payer: Medicaid Other | Admitting: Obstetrics and Gynecology

## 2017-12-10 VITALS — BP 117/81 | HR 98 | Ht 68.0 in | Wt 312.0 lb

## 2017-12-10 DIAGNOSIS — Z3049 Encounter for surveillance of other contraceptives: Secondary | ICD-10-CM | POA: Diagnosis not present

## 2017-12-10 DIAGNOSIS — Z3046 Encounter for surveillance of implantable subdermal contraceptive: Secondary | ICD-10-CM

## 2017-12-10 NOTE — Progress Notes (Signed)
22 yo G1P1 here for Nexplanon removal. Patient had Nexplanon placed in 2016. She plans to use condoms for contraception. Patient has never had a pap smear. Discussed returning for annual exam with pap smear  Removal Patient given informed consent for removal of her nexplanon, time out was performed.  Signed copy in the chart.  Appropriate time out taken. Nexplanon site identified.  Area prepped in usual sterile fashon. Three cc of 1% lidocaine was used to anesthetize the area at the distal end of the implant. A small stab incision was made right beside the implant on the distal portion.  The Nexplanon rod was grasped using hemostats and removed without difficulty.  There was less than 3 cc blood loss. There were no complications.  A small amount of antibiotic ointment and steri-strips were applied over the small incision.  A pressure bandage was applied to reduce any bruising.  The patient tolerated the procedure well and was given post procedure instructions.

## 2017-12-10 NOTE — Progress Notes (Signed)
Here today for Nexplanon removal.  It was placed February 2016.  It is in her left arm.  Wants to use condoms for birth control.

## 2017-12-14 ENCOUNTER — Ambulatory Visit (INDEPENDENT_AMBULATORY_CARE_PROVIDER_SITE_OTHER): Payer: Medicaid Other | Admitting: Physician Assistant

## 2017-12-14 ENCOUNTER — Encounter (INDEPENDENT_AMBULATORY_CARE_PROVIDER_SITE_OTHER): Payer: Self-pay | Admitting: Physician Assistant

## 2017-12-14 VITALS — BP 130/82 | HR 79 | Temp 98.0°F | Wt 314.6 lb

## 2017-12-14 DIAGNOSIS — F3164 Bipolar disorder, current episode mixed, severe, with psychotic features: Secondary | ICD-10-CM | POA: Diagnosis not present

## 2017-12-14 DIAGNOSIS — Z760 Encounter for issue of repeat prescription: Secondary | ICD-10-CM

## 2017-12-14 DIAGNOSIS — S81811A Laceration without foreign body, right lower leg, initial encounter: Secondary | ICD-10-CM | POA: Diagnosis not present

## 2017-12-14 DIAGNOSIS — R7303 Prediabetes: Secondary | ICD-10-CM | POA: Diagnosis not present

## 2017-12-14 DIAGNOSIS — Z7289 Other problems related to lifestyle: Secondary | ICD-10-CM

## 2017-12-14 MED ORDER — GABAPENTIN 600 MG PO TABS: 600.0000 mg | ORAL_TABLET | Freq: Three times a day (TID) | ORAL | 5 refills | Status: DC

## 2017-12-14 MED ORDER — LISINOPRIL 5 MG PO TABS: 5.0000 mg | ORAL_TABLET | Freq: Every day | ORAL | 3 refills | Status: DC

## 2017-12-14 MED ORDER — RISPERIDONE 0.5 MG PO TABS: 0.5000 mg | ORAL_TABLET | Freq: Every day | ORAL | 1 refills | Status: DC

## 2017-12-14 MED ORDER — CEPHALEXIN 500 MG PO TABS: 500.0000 mg | ORAL_TABLET | Freq: Three times a day (TID) | ORAL | 0 refills | Status: AC

## 2017-12-14 NOTE — Patient Instructions (Signed)
Bipolar 1 Disorder Bipolar 1 disorder is a mental health disorder in which a person has episodes of emotional highs (mania), and may also have episodes of emotional lows (depression) in addition to highs. Bipolar 1 disorder is different from other bipolar disorders because it involves extreme manic episodes. These episodes last at least one week or involve symptoms that are so severe that hospitalization is needed to keep the person safe. What increases the risk? The cause of this condition is not known. However, certain factors make you more likely to have bipolar disorder, such as:  Having a family member with the disorder.  An imbalance of certain chemicals in the brain (neurotransmitters).  Stress, such as illness, financial problems, or a death.  Certain conditions that affect the brain or spinal cord (neurologic conditions).  Brain injury (trauma).  Having another mental health disorder, such as: ? Obsessive compulsive disorder. ? Schizophrenia.  What are the signs or symptoms? Symptoms of mania include:  Very high self-esteem or self-confidence.  Decreased need for sleep.  Unusual talkativeness or feeling a need to keep talking. Speech may be very fast. It may seem like you cannot stop talking.  Racing thoughts or constant talking, with quick shifts between topics that may or may not be related (flight of ideas).  Decreased ability to focus or concentrate.  Increased purposeful activity, such as work, studies, or social activity.  Increased nonproductive activity. This could be pacing, squirming and fidgeting, or finger and toe tapping.  Impulsive behavior and poor judgment. This may result in high-risk activities, such as having unprotected sex or spending a lot of money.  Symptoms of depression include:  Feeling sad, hopeless, or helpless.  Frequent or uncontrollable crying.  Lack of feeling or caring about anything.  Sleeping too much.  Moving more slowly  than usual.  Not being able to enjoy things you used to enjoy.  Wanting to be alone all the time.  Feeling guilty or worthless.  Lack of energy or motivation.  Trouble concentrating or remembering.  Trouble making decisions.  Increased appetite.  Thoughts of death, or the desire to harm yourself.  Sometimes, you may have a mixed mood. This means having symptoms of depression and mania. Stress can make symptoms worse. How is this diagnosed? To diagnose bipolar disorder, your health care provider may ask about your:  Emotional episodes.  Medical history.  Alcohol and drug use. This includes prescription medicines. Certain medical conditions and substances can cause symptoms that seem like bipolar disorder (secondary bipolar disorder).  How is this treated? Bipolar disorder is a long-term (chronic) illness. It is best controlled with ongoing (continuous) treatment rather than treatment only when symptoms occur. Treatment may include:  Medicine. Medicine can be prescribed by a provider who specializes in treating mental disorders (psychiatrist). ? Medicines called mood stabilizers are usually prescribed. ? If symptoms occur even while taking a mood stabilizer, other medicines may be added.  Psychotherapy. Some forms of talk therapy, such as cognitive-behavioral therapy (CBT), can provide support, education, and guidance.  Coping methods, such as journaling or relaxation exercises. These may include: ? Yoga. ? Meditation. ? Deep breathing.  Lifestyle changes, such as: ? Limiting alcohol and drug use. ? Exercising regularly. ? Getting plenty of sleep. ? Making healthy eating choices.  A combination of medicine, talk therapy, and coping methods is best. A procedure in which electricity is applied to the brain through the scalp (electroconvulsive therapy) may be used in cases of severe mania when medicine  and psychotherapy work too slowly or do not work. Follow these  instructions at home: Activity   Return to your normal activities as told by your health care provider.  Find activities that you enjoy, and make time to do them.  Exercise regularly as told by your health care provider. Lifestyle  Limit alcohol intake to no more than 1 drink a day for nonpregnant women and 2 drinks a day for men. One drink equals 12 oz of beer, 5 oz of wine, or 1 oz of hard liquor.  Follow a set schedule for eating and sleeping.  Eat a balanced diet that includes fresh fruits and vegetables, whole grains, low-fat dairy, and lean meat.  Get 7-8 hours of sleep each night. General instructions  Take over-the-counter and prescription medicines only as told by your health care provider.  Think about joining a support group. Your health care provider may be able to recommend a support group.  Talk with your family and loved ones about your treatment goals and how they can help.  Keep all follow-up visits as told by your health care provider. This is important. Where to find more information: For more information about bipolar disorder, visit the following websites:  [employer] on Mental Illness: www.nami.[employer]: https://carter.com/  Contact a health care provider if:  Your symptoms get worse.  You have side effects from your medicine, and they get worse.  You have trouble sleeping.  You have trouble doing daily activities.  You feel unsafe in your surroundings.  You are dealing with substance abuse. Get help right away if:  You have new symptoms.  You have thoughts about harming yourself.  You self-harm. This information is not intended to replace advice given to you by your health care provider. Make sure you discuss any questions you have with your health care provider. Document Released: 02/22/2001 Document Revised: 07/12/2016 Document Reviewed: 07/16/2016 Elsevier Interactive Patient Education  [employer].

## 2017-12-14 NOTE — Progress Notes (Signed)
Pt wants Rx for lisinopril has taken it in the past for kidney disease
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
## 2017-12-31 ENCOUNTER — Ambulatory Visit: Payer: Medicaid Other | Admitting: Advanced Practice Midwife

## 2018-01-11 ENCOUNTER — Ambulatory Visit (INDEPENDENT_AMBULATORY_CARE_PROVIDER_SITE_OTHER): Payer: Medicaid Other | Admitting: Physician Assistant

## 2018-03-04 ENCOUNTER — Ambulatory Visit: Payer: Medicaid Other | Admitting: Family

## 2018-03-15 ENCOUNTER — Encounter (HOSPITAL_COMMUNITY): Payer: Self-pay | Admitting: *Deleted

## 2018-03-15 ENCOUNTER — Inpatient Hospital Stay (HOSPITAL_COMMUNITY)
Admission: AD | Admit: 2018-03-15 | Discharge: 2018-03-16 | Disposition: A | Discharge status: Home / Self Care | Payer: Medicaid Other | Source: Ambulatory Visit | Attending: Obstetrics and Gynecology | Admitting: Obstetrics and Gynecology

## 2018-03-15 DIAGNOSIS — O99341 Other mental disorders complicating pregnancy, first trimester: Secondary | ICD-10-CM | POA: Insufficient documentation

## 2018-03-15 DIAGNOSIS — F319 Bipolar disorder, unspecified: Secondary | ICD-10-CM | POA: Insufficient documentation

## 2018-03-15 DIAGNOSIS — Z3A01 Less than 8 weeks gestation of pregnancy: Secondary | ICD-10-CM | POA: Diagnosis not present

## 2018-03-15 DIAGNOSIS — O99351 Diseases of the nervous system complicating pregnancy, first trimester: Secondary | ICD-10-CM | POA: Insufficient documentation

## 2018-03-15 DIAGNOSIS — G4700 Insomnia, unspecified: Secondary | ICD-10-CM | POA: Insufficient documentation

## 2018-03-15 DIAGNOSIS — Z79899 Other long term (current) drug therapy: Secondary | ICD-10-CM | POA: Insufficient documentation

## 2018-03-15 DIAGNOSIS — O200 Threatened abortion: Secondary | ICD-10-CM | POA: Insufficient documentation

## 2018-03-15 DIAGNOSIS — O99211 Obesity complicating pregnancy, first trimester: Secondary | ICD-10-CM | POA: Insufficient documentation

## 2018-03-15 DIAGNOSIS — O161 Unspecified maternal hypertension, first trimester: Secondary | ICD-10-CM | POA: Diagnosis not present

## 2018-03-15 DIAGNOSIS — F4310 Post-traumatic stress disorder, unspecified: Secondary | ICD-10-CM | POA: Diagnosis not present

## 2018-03-15 DIAGNOSIS — O26899 Other specified pregnancy related conditions, unspecified trimester: Secondary | ICD-10-CM

## 2018-03-15 DIAGNOSIS — R109 Unspecified abdominal pain: Secondary | ICD-10-CM | POA: Diagnosis present

## 2018-03-15 NOTE — MAU Note (Addendum)
PT SAYS SHE   DID HPT-     2-3 WEEKS AGO.- BOTH POSITIVE .   TONIGHT  SHE HAD SEX-   AT 7 PM-   THEN  HAD  MILD CRAMPS  AND  VAG SPOTTING.       WHEN SHE WIPES - STILL SPOTTING.   HAD 1  SMALL CLOT.    PT  ARRIVED  VIA EMS

## 2018-03-16 ENCOUNTER — Inpatient Hospital Stay (HOSPITAL_COMMUNITY): Payer: Medicaid Other

## 2018-03-16 DIAGNOSIS — O200 Threatened abortion: Secondary | ICD-10-CM | POA: Diagnosis not present

## 2018-03-16 LAB — COMPREHENSIVE METABOLIC PANEL
ALT: 22 U/L (ref 14–54)
AST: 18 U/L (ref 15–41)
Albumin: 3.6 g/dL (ref 3.5–5.0)
Alkaline Phosphatase: 46 U/L (ref 38–126)
Anion gap: 10 (ref 5–15)
BUN: 15 mg/dL (ref 6–20)
CHLORIDE: 104 mmol/L (ref 101–111)
CO2: 24 mmol/L (ref 22–32)
CREATININE: 1.07 mg/dL — AB (ref 0.44–1.00)
Calcium: 9.3 mg/dL (ref 8.9–10.3)
GFR calc Af Amer: >60 mL/min (ref >60)
GFR calc non Af Amer: >60 mL/min (ref >60)
GLUCOSE: 104 mg/dL — AB (ref 65–99)
Potassium: 4.2 mmol/L (ref 3.5–5.1)
SODIUM: 138 mmol/L (ref 135–145)
Total Bilirubin: 0.6 mg/dL (ref 0.3–1.2)
Total Protein: 7.6 g/dL (ref 6.5–8.1)

## 2018-03-16 LAB — WET PREP, GENITAL
Clue Cells Wet Prep HPF POC: NONE SEEN
Sperm: NONE SEEN
Trich, Wet Prep: NONE SEEN
Yeast Wet Prep HPF POC: NONE SEEN

## 2018-03-16 LAB — CBC
HCT: 43.2 % (ref 36.0–46.0)
Hemoglobin: 14.9 g/dL (ref 12.0–15.0)
MCH: 30.6 pg (ref 26.0–34.0)
MCHC: 34.5 g/dL (ref 30.0–36.0)
MCV: 88.7 fL (ref 78.0–100.0)
Platelets: 250 10*3/uL (ref 150–400)
RBC: 4.87 MIL/uL (ref 3.87–5.11)
RDW: 13.1 % (ref 11.5–15.5)
WBC: 12.7 10*3/uL — AB (ref 4.0–10.5)

## 2018-03-16 LAB — ABO/RH: ABO/RH(D): A NEG

## 2018-03-16 LAB — HCG, QUANTITATIVE, PREGNANCY: hCG, Beta Chain, Quant, S: 9601 m[IU]/mL — ABNORMAL HIGH (ref <5)

## 2018-03-16 LAB — GC/CHLAMYDIA PROBE AMP (~~LOC~~) NOT AT ARMC
Chlamydia: NEGATIVE
NEISSERIA GONORRHEA: NEGATIVE

## 2018-03-16 MED ORDER — DOXYLAMINE-PYRIDOXINE 10-10 MG PO TBEC: 10.0000 mg | DELAYED_RELEASE_TABLET | Freq: Four times a day (QID) | ORAL | 3 refills | Status: DC

## 2018-03-16 MED ORDER — RHO D IMMUNE GLOBULIN 1500 UNIT/2ML IJ SOSY
300.0000 ug | PREFILLED_SYRINGE | Freq: Once | INTRAMUSCULAR | Status: AC
  Administered 2018-03-16: 300 ug via INTRAMUSCULAR
  Filled 2018-03-16: qty 2

## 2018-03-16 NOTE — Discharge Instructions (Signed)
Vaginal Bleeding During Pregnancy, First Trimester A small amount of bleeding (spotting) from the vagina is common in early pregnancy. Sometimes the bleeding is normal and is not a problem, and sometimes it is a sign of something serious. Be sure to tell your doctor about any bleeding from your vagina right away. Follow these instructions at home:  Watch your condition for any changes.  Follow your doctor's instructions about how active you can be.  If you are on bed rest: ? You may need to stay in bed and only get up to use the bathroom. ? You may be allowed to do some activities. ? If you need help, make plans for someone to help you.  Write down: ? The number of pads you use each day. ? How often you change pads. ? How soaked (saturated) your pads are.  Do not use tampons.  Do not douche.  Do not have sex or orgasms until your doctor says it is okay.  If you pass any tissue from your vagina, save the tissue so you can show it to your doctor.  Only take medicines as told by your doctor.  Do not take aspirin because it can make you bleed.  Keep all follow-up visits as told by your doctor. Contact a doctor if:  You bleed from your vagina.  You have cramps.  You have labor pains.  You have a fever that does not go away after you take medicine. Get help right away if:  You have very bad cramps in your back or belly (abdomen).  You pass large clots or tissue from your vagina.  You bleed more.  You feel light-headed or weak.  You pass out (faint).  You have chills.  You are leaking fluid or have a gush of fluid from your vagina.  You pass out while pooping (having a bowel movement). This information is not intended to replace advice given to you by your health care provider. Make sure you discuss any questions you have with your health care provider. Document Released: 04/02/2014 Document Revised: 04/23/2016 Document Reviewed: 07/24/2013 Elsevier Interactive  Patient Education  [employer].

## 2018-03-16 NOTE — MAU Provider Note (Signed)
History     CSN: [id number]  Arrival date and time: 03/15/18 2151   None     Chief Complaint  Patient presents with  . Vaginal Bleeding   HPI Natasha Long is a 22yo G1 early preg who presents for eval of postcoital spotting last PM. Reports mild cramping but no pain. Pt w/ bipolar (currently only on Neurontin) and cHTN (no meds).  OB History    Gravida  1   Para      Term      Preterm      AB      Living  1     SAB      TAB      Ectopic      Multiple      Live Births  1           Past Medical History:  Diagnosis Date  . Bipolar affective, manic ([place])   . Hypertension   . Hypoglycemia   . Insomnia   . Nephrotic syndrome   . PTSD (post-traumatic stress disorder)     Past Surgical History:  Procedure Laterality Date  . CHOLECYSTECTOMY    . extraction of wisdom teeth    . RENAL BIOPSY      Family History  Problem Relation Age of Onset  . Diabetes Other   . Hypertension Other     Social History   Tobacco Use  . Smoking status: Never Smoker  . Smokeless tobacco: Never Used  Substance Use Topics  . Alcohol use: No  . Drug use: No    Allergies:  Allergies  Allergen Reactions  . Prednisone Other (See Comments)    Pt states that this med caused pancreatitis.   . Prozac [Fluoxetine Hcl] Other (See Comments)    Panic attack   . Wellbutrin [Bupropion] Other (See Comments)    Panic attack    Medications Prior to Admission  Medication Sig Dispense Refill Last Dose  . ARIPiprazole (ABILIFY) 5 MG tablet Take 5 mg by mouth daily.   Taking  . etonogestrel (NEXPLANON) 68 MG IMPL implant 1 each by Subdermal route once.   Not Taking  . gabapentin (NEURONTIN) 600 MG tablet Take 1 tablet (600 mg total) by mouth 3 (three) times daily. 90 tablet 5   . hydrOXYzine (ATARAX/VISTARIL) 25 MG tablet Take 1 tablet (25 mg total) by mouth every 6 (six) hours. (Patient taking differently: Take 25 mg by mouth every 6 (six) hours as needed for anxiety. ) 30  tablet 0 Taking  . lisinopril (PRINIVIL,ZESTRIL) 5 MG tablet Take 1 tablet (5 mg total) by mouth daily. 90 tablet 3   . naproxen (NAPROSYN) 500 MG tablet Take 1 tablet (500 mg total) 2 (two) times daily with a meal by mouth. 30 tablet 0 Taking  . risperiDONE (RISPERDAL) 0.5 MG tablet Take 1 tablet (0.5 mg total) by mouth at bedtime. 30 tablet 1   . traZODone (DESYREL) 100 MG tablet Take 1 tablet (100 mg total) by mouth at bedtime. 30 tablet 0 Taking    Review of Systems No pertinents other than what is listed in HPI  Physical Exam   Blood pressure 129/62, pulse (!) 55, temperature 98.5 F (36.9 C), temperature source Oral, resp. rate 20, height 5\' 8"  (1.727 m), weight (!) 143.9 kg (317 lb 4 oz), last menstrual period 01/28/2018.  Physical Exam  Constitutional: She is oriented to person, place, and time. She appears well-developed.  Morbidly obese  HENT:  Head: Normocephalic.  Neck: Normal range of motion.  Cardiovascular: Normal rate.  Respiratory: Effort normal.  Genitourinary:  Genitourinary Comments: SE: sm pink/white d/c at cx; no CMT; bimanual difficult due to habitus  Musculoskeletal: Normal range of motion.  Neurological: She is alert and oriented to person, place, and time.  Skin: Skin is warm and dry.  Psychiatric: She has a normal mood and affect. Her behavior is normal. Thought content normal.   CBC    Component Value Date/Time   WBC 12.7 (H) 03/15/2018 2238   RBC 4.87 03/15/2018 2238   HGB 14.9 03/15/2018 2238   HCT 43.2 03/15/2018 2238   PLT 250 03/15/2018 2238   MCV 88.7 03/15/2018 2238   MCH 30.6 03/15/2018 2238   MCHC 34.5 03/15/2018 2238   RDW 13.1 03/15/2018 2238   LYMPHSABS 2.4 08/15/2017 0040   MONOABS 0.7 08/15/2017 0040   EOSABS 0.0 08/15/2017 0040   BASOSABS 0.0 08/15/2017 0040   CMP     Component Value Date/Time   NA 138 03/15/2018 2238   K 4.2 03/15/2018 2238   CL 104 03/15/2018 2238   CO2 24 03/15/2018 2238   GLUCOSE 104 (H) 03/15/2018  2238   BUN 15 03/15/2018 2238   CREATININE 1.07 (H) 03/15/2018 2238   CALCIUM 9.3 03/15/2018 2238   PROT 7.6 03/15/2018 2238   ALBUMIN 3.6 03/15/2018 2238   AST 18 03/15/2018 2238   ALT 22 03/15/2018 2238   ALKPHOS 46 03/15/2018 2238   BILITOT 0.6 03/15/2018 2238   GFRNONAA >60 03/15/2018 2238   GFRAA >60 03/15/2018 2238   MAU Course  Procedures  MDM BHcg, U/S, Rhogam given, GC/chlam pend, CBC, CMET  Assessment and Plan  IUP@6 .3wks Threatened ab Rh neg  D/C home with bleeding precautions Rx Diclegis F/U at scheduled OB appt at [place] 03/16/2018, 4:34 AM

## 2018-03-17 ENCOUNTER — Ambulatory Visit: Payer: Medicaid Other | Admitting: Family

## 2018-03-17 ENCOUNTER — Emergency Department (HOSPITAL_COMMUNITY)
Admission: EM | Admit: 2018-03-17 | Discharge: 2018-03-17 | Disposition: A | Discharge status: Home / Self Care | Payer: Medicaid Other | Attending: Emergency Medicine | Admitting: Emergency Medicine

## 2018-03-17 ENCOUNTER — Encounter (HOSPITAL_COMMUNITY): Payer: Self-pay | Admitting: Emergency Medicine

## 2018-03-17 DIAGNOSIS — I10 Essential (primary) hypertension: Secondary | ICD-10-CM | POA: Insufficient documentation

## 2018-03-17 DIAGNOSIS — Z202 Contact with and (suspected) exposure to infections with a predominantly sexual mode of transmission: Secondary | ICD-10-CM | POA: Diagnosis not present

## 2018-03-17 DIAGNOSIS — Z79899 Other long term (current) drug therapy: Secondary | ICD-10-CM | POA: Diagnosis not present

## 2018-03-17 LAB — RH IG WORKUP (INCLUDES ABO/RH)
ABO/RH(D): A NEG
Antibody Screen: NEGATIVE
Gestational Age(Wks): 8
Unit division: 0

## 2018-03-17 MED ORDER — AZITHROMYCIN 250 MG PO TABS
1000.0000 mg | ORAL_TABLET | Freq: Once | ORAL | Status: AC
  Administered 2018-03-17: 1000 mg via ORAL
  Filled 2018-03-17: qty 4

## 2018-03-17 NOTE — Discharge Instructions (Addendum)
Follow-up with your OBGYN

## 2018-03-17 NOTE — ED Triage Notes (Signed)
Per [facility] pt presents ambulatory stating her partner told her about a week ago that he had chlamydia. Pt is [redacted] weeks pregnant. C/o frequent urination.

## 2018-03-17 NOTE — ED Provider Notes (Signed)
[facility] DEPT Provider Note   CSN: [id number] Arrival date & time: 03/17/18  1209     History   Chief Complaint Chief Complaint  Patient presents with  . Exposure to STD    HPI Natasha Long is a 22 y.o. female who present to the ED for STI screening after her sex partner told her he has chlamydia. Patient is early pregnant. In review of the patient's chart she was evaluated by CNM 2 days ago and tested for STI's and they were negative. Patient also had ultrasound at that visit.  Patient still concerned because her partner tested positive for Chlamydia and would like to be treated. Patient is having no symptoms   HPI  Past Medical History:  Diagnosis Date  . Bipolar affective, manic ([place])   . Hypertension   . Hypoglycemia   . Insomnia   . Nephrotic syndrome   . PTSD (post-traumatic stress disorder)     Patient Active Problem List   Diagnosis Date Noted  . Bipolar affective disorder, depressed, mild ([place]) 04/09/2017  . Cocaine abuse ([place]) 04/09/2017    Past Surgical History:  Procedure Laterality Date  . CHOLECYSTECTOMY    . extraction of wisdom teeth    . RENAL BIOPSY       OB History    Gravida  2   Para      Term      Preterm      AB      Living  1     SAB      TAB      Ectopic      Multiple      Live Births  1            Home Medications    Prior to Admission medications   Medication Sig Start Date End Date Taking? Authorizing Provider  ARIPiprazole (ABILIFY) 10 MG tablet Take 10 mg by mouth daily. 01/26/18  Yes [provider]  Doxylamine-Pyridoxine 10-10 MG TBEC Take 10 mg by mouth 4 (four) times daily. One in the morning, one mid afternoon, two at bedtime 03/16/18  Yes Myrtis Ser, CNM  escitalopram (LEXAPRO) 20 MG tablet Take 20 mg by mouth daily. 12/14/17  Yes [provider]  gabapentin (NEURONTIN) 600 MG tablet Take 1 tablet (600 mg total) by mouth 3 (three) times daily.  12/14/17  Yes Clent Demark, PA-C  hydrOXYzine (ATARAX/VISTARIL) 50 MG tablet Take 50 mg by mouth 3 (three) times daily. 12/29/17  Yes [provider]  mirtazapine (REMERON) 30 MG tablet Take 30 mg by mouth at bedtime. 01/26/18  Yes [provider]    Family History Family History  Problem Relation Age of Onset  . Diabetes Other   . Hypertension Other     Social History Social History   Tobacco Use  . Smoking status: Never Smoker  . Smokeless tobacco: Never Used  Substance Use Topics  . Alcohol use: No  . Drug use: No     Allergies   Prednisone; Prozac [fluoxetine hcl]; and Wellbutrin [bupropion]   Review of Systems Review of Systems  Genitourinary:       Pregnant  All other systems reviewed and are negative.    Physical Exam Updated Vital Signs BP 132/76 (BP Location: Left Arm)   Pulse 64   Temp 98.3 F (36.8 C) (Oral)   Resp 20   Ht 5\' 8"  (1.727 m)   Wt (!) 143.8 kg (317 lb)  LMP 01/28/2018   SpO2 99%   BMI 48.20 kg/m   Physical Exam  Constitutional: She appears well-developed and well-nourished. No distress.  HENT:  Head: Normocephalic.  Eyes: EOM are normal.  Neck: Neck supple.  Cardiovascular: Normal rate.  Pulmonary/Chest: Effort normal.  Abdominal: Soft. There is no tenderness.  Genitourinary:  Genitourinary Comments: Pelvic done yesterday at [facility] and cultures done at that time. Will not repeat today.  Musculoskeletal: Normal range of motion.  Neurological: She is alert.  Skin: Skin is warm and dry.  Psychiatric: She has a normal mood and affect. Her behavior is normal.  Nursing note and vitals reviewed.    ED Treatments / Results  Labs (all labs ordered are listed, but only abnormal results are displayed) Labs Reviewed  GC/CHLAMYDIA PROBE AMP ([place]) NOT AT [facility]  Radiology US Ob Comp Less 14 Wks  Result Date: 03/16/2018 CLINICAL DATA:  Acute onset of pelvic cramping and vaginal spotting. EXAM: OBSTETRIC  <14 WK [place] AND TRANSVAGINAL OB US TECHNIQUE: Both transabdominal and transvaginal ultrasound examinations were performed for complete evaluation of the gestation as well as the maternal uterus, adnexal regions, and pelvic cul-de-sac. Transvaginal technique was performed to assess early pregnancy. COMPARISON:  None. FINDINGS: Intrauterine gestational sac: Single; visualized and normal in shape. Yolk sac:  Yes Embryo:  Yes Cardiac Activity: Yes Heart Rate: 115  bpm CRL:  7  mm   6 w   3 d                  [place] EDC: 11/06/2018 Subchorionic hemorrhage:  None visualized. Maternal uterus/adnexae: The uterus is otherwise unremarkable. The ovaries are within normal limits. The right ovary measures 2.5 x 2.1 x 1.4 cm, while the left ovary measures 2.9 x 1.8 x 1.6 cm. No suspicious adnexal masses are seen; there is no evidence for ovarian torsion. Trace free fluid is noted within the pelvis. IMPRESSION: Single live intrauterine pregnancy noted, with a crown-rump length of 7 mm, corresponding to a gestational age of [redacted] weeks 3 days. This matches the gestational age of [redacted] weeks 5 days by LMP, reflecting an estimated date of delivery of November 04, 2018. Electronically Signed   By: Garald Balding M.D.   On: 03/16/2018 01:23   US Ob Transvaginal  Result Date: 03/16/2018 CLINICAL DATA:  Acute onset of pelvic cramping and vaginal spotting. EXAM: OBSTETRIC <14 WK [place] AND TRANSVAGINAL OB US TECHNIQUE: Both transabdominal and transvaginal ultrasound examinations were performed for complete evaluation of the gestation as well as the maternal uterus, adnexal regions, and pelvic cul-de-sac. Transvaginal technique was performed to assess early pregnancy. COMPARISON:  None. FINDINGS: Intrauterine gestational sac: Single; visualized and normal in shape. Yolk sac:  Yes Embryo:  Yes Cardiac Activity: Yes Heart Rate: 115  bpm CRL:  7  mm   6 w   3 d                  [place] EDC: 11/06/2018 Subchorionic hemorrhage:  None visualized. Maternal  uterus/adnexae: The uterus is otherwise unremarkable. The ovaries are within normal limits. The right ovary measures 2.5 x 2.1 x 1.4 cm, while the left ovary measures 2.9 x 1.8 x 1.6 cm. No suspicious adnexal masses are seen; there is no evidence for ovarian torsion. Trace free fluid is noted within the pelvis. IMPRESSION: Single live intrauterine pregnancy noted, with a crown-rump length of 7 mm, corresponding to a gestational age of [redacted] weeks 3 days. This matches the gestational  age of [redacted] weeks 5 days by LMP, reflecting an estimated date of delivery of November 04, 2018. Electronically Signed   By: Garald Balding M.D.   On: 03/16/2018 01:23    Procedures Procedures (including critical care time)  Medications Ordered in ED Medications  azithromycin (ZITHROMAX) tablet 1,000 mg (has no administration in time range)     Initial Impression / Assessment and Plan / ED Course  I have reviewed the triage vital signs and the nursing notes. 23 y.o. female in first trimester pregnancy with STD exposure and cultures done yesterday for GC and Chlamydia are negative. Patient still request treatment for chlamydia since her partner has tested positive. Will treat with Zithromax 1 gram PO and patient to f/u with her OB. Stable for d/c without symptoms.   Final Clinical Impressions(s) / ED Diagnoses   Final diagnoses:  STD exposure    ED Discharge Orders    None       Debroah Baller [place], [place] 03/17/18 1524    Isla Pence, MD 03/25/18 [phone]

## 2018-04-01 ENCOUNTER — Other Ambulatory Visit (HOSPITAL_COMMUNITY)
Admission: RE | Admit: 2018-04-01 | Discharge: 2018-04-01 | Disposition: A | Discharge status: Home / Self Care | Payer: Medicaid Other | Source: Ambulatory Visit | Attending: Family | Admitting: Family

## 2018-04-01 ENCOUNTER — Encounter: Payer: Self-pay | Admitting: Family

## 2018-04-01 ENCOUNTER — Ambulatory Visit (INDEPENDENT_AMBULATORY_CARE_PROVIDER_SITE_OTHER): Payer: Medicaid Other | Admitting: Family

## 2018-04-01 VITALS — BP 126/83 | HR 67 | Temp 98.4°F | Wt 315.6 lb

## 2018-04-01 DIAGNOSIS — I10 Essential (primary) hypertension: Secondary | ICD-10-CM | POA: Diagnosis not present

## 2018-04-01 DIAGNOSIS — O219 Vomiting of pregnancy, unspecified: Secondary | ICD-10-CM

## 2018-04-01 DIAGNOSIS — N049 Nephrotic syndrome with unspecified morphologic changes: Secondary | ICD-10-CM | POA: Insufficient documentation

## 2018-04-01 DIAGNOSIS — F4310 Post-traumatic stress disorder, unspecified: Secondary | ICD-10-CM

## 2018-04-01 DIAGNOSIS — Z6791 Unspecified blood type, Rh negative: Secondary | ICD-10-CM

## 2018-04-01 DIAGNOSIS — O0990 Supervision of high risk pregnancy, unspecified, unspecified trimester: Secondary | ICD-10-CM | POA: Insufficient documentation

## 2018-04-01 DIAGNOSIS — Z3A00 Weeks of gestation of pregnancy not specified: Secondary | ICD-10-CM | POA: Diagnosis not present

## 2018-04-01 DIAGNOSIS — O26891 Other specified pregnancy related conditions, first trimester: Secondary | ICD-10-CM

## 2018-04-01 DIAGNOSIS — F1410 Cocaine abuse, uncomplicated: Secondary | ICD-10-CM

## 2018-04-01 DIAGNOSIS — O26899 Other specified pregnancy related conditions, unspecified trimester: Secondary | ICD-10-CM | POA: Insufficient documentation

## 2018-04-01 MED ORDER — DOXYLAMINE-PYRIDOXINE 10-10 MG PO TBEC: 10.0000 mg | DELAYED_RELEASE_TABLET | Freq: Four times a day (QID) | ORAL | 3 refills | Status: DC

## 2018-04-01 NOTE — Patient Instructions (Signed)

## 2018-04-01 NOTE — Progress Notes (Signed)
Subjective:    Natasha Long is a G2P1001 [redacted]w[redacted]d being seen today for her first obstetrical visit. Here with FOB Antonios.   Her obstetrical history is significant for one term delivery without complications.  Pt medical history complicated by chronic hypertension diagnosed as a child.  Reports taking medication previously that were discontinued at the age of 22 yo.  Pt also has nephrotic syndrome.  Primary care received at [facility].  Prior history of crack cocaine use x 1 year.  Shared difficulty in coping after break-up from first child's father.  Moved from the state to [place] and received care at a crisis center for recovery.  With new partner who is supportive and aware of previous history.   Patient desires to breast feed. Pregnancy history fully reviewed.  Patient reports nausea and vomiting.  Vitals:   04/01/18 1604  BP: 126/83  Pulse: 67  Temp: 98.4 F (36.9 C)  Weight: (!) 315 lb 9.6 oz (143.2 kg)    HISTORY: OB History  Gravida Para Term Preterm AB Living  2 1 1     1   SAB TAB Ectopic Multiple Live Births          1    # Outcome Date GA Lbr Len/2nd Weight Sex Delivery Anes PTL Lv  2 Current           1 Term 11/11/14 [redacted]w[redacted]d  6 lb 11 oz (3.033 kg) M Vag-Spont EPI N LIV     Complications: Postpartum hemorrhage   Past Medical History:  Diagnosis Date  . Bipolar affective, manic ([place])   . Hypertension    diagnosed as child; stopped meds at 39 yo  . Hypoglycemia   . Insomnia   . Nephrotic syndrome   . PTSD (post-traumatic stress disorder)    Past Surgical History:  Procedure Laterality Date  . CHOLECYSTECTOMY    . extraction of wisdom teeth    . RENAL BIOPSY     Family History  Problem Relation Age of Onset  . Diabetes Other   . Hypertension Other      Exam   BP 126/83   Pulse 67   Temp 98.4 F (36.9 C)   Wt (!) 315 lb 9.6 oz (143.2 kg)   LMP 01/28/2018 (Exact Date)   BMI 47.99 kg/m  Uterine Size: size equals dates   Pelvic Exam:    Perineum: No Hemorrhoids, Normal Perineum   Vulva: normal   Vagina:  normal mucosa, normal discharge, no palpable nodules   pH: Not done   Cervix: no bleeding following Pap, no cervical motion tenderness and no lesions   Adnexa: normal adnexa and no mass, fullness, tenderness   Bony Pelvis: Adequate  System: Breast:  No nipple retraction or dimpling, No nipple discharge or bleeding, No axillary or supraclavicular adenopathy, Normal to palpation without dominant masses   Skin: normal coloration and turgor, no rashes    Neurologic: negative   Extremities: normal strength, tone, and muscle mass   HEENT neck supple with midline trachea and thyroid without masses   Mouth/Teeth mucous membranes moist, pharynx normal without lesions   Neck supple and no masses   Cardiovascular: regular rate and rhythm, no murmurs or gallops   Respiratory:  appears well, vitals normal, no respiratory distress, acyanotic, normal RR, neck free of mass or lymphadenopathy, chest clear, no wheezing, crepitations, rhonchi, normal symmetric air entry   Abdomen: soft, non-tender; bowel sounds normal; no masses,  no organomegaly   Urinary: urethral meatus  normal      Assessment:    Pregnancy: G2P1001 Patient Active Problem List   Diagnosis Date Noted  . Nephrotic syndrome 04/01/2018  . Supervision of high risk pregnancy, antepartum 04/01/2018  . Rh negative status during pregnancy 04/01/2018  . PTSD (post-traumatic stress disorder) 04/01/2018  . Bipolar affective disorder, depressed, mild ([place]) 04/09/2017  . Cocaine abuse ([place]) 04/09/2017        Plan:     Initial labs drawn. Baseline preeclampsia labs Order for MFM consult Consult with attending physician for management plan related to medical dx of nephrotic syndrome Prenatal vitamins. PMH form completed. Problem list reviewed and updated. Genetic Screening discussed: schedule appt for genetic counseling. Ultrasound discussed;  dating ultrasound ordered.  Follow up in 4 weeks.  Venia Carbon [facility] [employer] 04/01/2018

## 2018-04-04 ENCOUNTER — Other Ambulatory Visit: Payer: Self-pay | Admitting: Family

## 2018-04-04 ENCOUNTER — Encounter (INDEPENDENT_AMBULATORY_CARE_PROVIDER_SITE_OTHER): Payer: Self-pay

## 2018-04-04 DIAGNOSIS — I10 Essential (primary) hypertension: Secondary | ICD-10-CM | POA: Insufficient documentation

## 2018-04-04 LAB — PROTEIN / CREATININE RATIO, URINE
Creatinine, Urine: 298 mg/dL
PROTEIN UR: 795.5 mg/dL
PROTEIN/CREAT RATIO: 2669 mg/g{creat} — AB (ref 0–200)

## 2018-04-05 LAB — CYTOLOGY - PAP
CHLAMYDIA, DNA PROBE: NEGATIVE
DIAGNOSIS: NEGATIVE
NEISSERIA GONORRHEA: NEGATIVE

## 2018-04-05 LAB — HEMOGLOBINOPATHY EVALUATION
HGB C: 0 %
HGB S: 0 %
HGB VARIANT: 0 %
Hemoglobin A2 Quantitation: 2.4 % (ref 1.8–3.2)
Hemoglobin F Quantitation: 0 % (ref 0.0–2.0)
Hgb A: 97.6 % (ref 96.4–98.8)

## 2018-04-05 LAB — HIV ANTIBODY (ROUTINE TESTING W REFLEX): HIV SCREEN 4TH GENERATION: NONREACTIVE

## 2018-04-06 LAB — DRUG SCREEN, URINE
AMPHETAMINES, URINE: NEGATIVE ng/mL
BENZODIAZEPINE QUANT UR: NEGATIVE ng/mL
Barbiturate screen, urine: NEGATIVE ng/mL
CANNABINOID QUANT UR: POSITIVE ng/mL — AB
COCAINE (METAB.): NEGATIVE ng/mL
Opiate Quant, Ur: NEGATIVE ng/mL
PCP Quant, Ur: NEGATIVE ng/mL

## 2018-04-07 ENCOUNTER — Encounter: Payer: Self-pay | Admitting: Family Medicine

## 2018-04-07 DIAGNOSIS — O26839 Pregnancy related renal disease, unspecified trimester: Secondary | ICD-10-CM | POA: Insufficient documentation

## 2018-04-07 LAB — CULTURE, OB URINE

## 2018-04-07 LAB — URINE CULTURE, OB REFLEX: Organism ID, Bacteria: NO GROWTH

## 2018-04-08 LAB — OBSTETRIC PANEL, INCLUDING HIV
BASOS ABS: 0 10*3/uL (ref 0.0–0.2)
Basos: 0 %
EOS (ABSOLUTE): 0.2 10*3/uL (ref 0.0–0.4)
Eos: 2 %
HEMATOCRIT: 41.8 % (ref 34.0–46.6)
HIV Screen 4th Generation wRfx: NONREACTIVE
Hemoglobin: 14.5 g/dL (ref 11.1–15.9)
Hepatitis B Surface Ag: NEGATIVE
IMMATURE GRANS (ABS): 0.1 10*3/uL (ref 0.0–0.1)
IMMATURE GRANULOCYTES: 1 %
LYMPHS: 27 %
Lymphocytes Absolute: 3 10*3/uL (ref 0.7–3.1)
MCH: 30.5 pg (ref 26.6–33.0)
MCHC: 34.7 g/dL (ref 31.5–35.7)
MCV: 88 fL (ref 79–97)
MONOCYTES: 6 %
MONOS ABS: 0.6 10*3/uL (ref 0.1–0.9)
NEUTROS PCT: 64 %
Neutrophils Absolute: 7.2 10*3/uL — ABNORMAL HIGH (ref 1.4–7.0)
PLATELETS: 260 10*3/uL (ref 150–379)
RBC: 4.75 x10E6/uL (ref 3.77–5.28)
RDW: 13.5 % (ref 12.3–15.4)
RPR Ser Ql: NONREACTIVE
RUBELLA: 2.78 {index} (ref >0.99)
Rh Factor: NEGATIVE
WBC: 11 10*3/uL — ABNORMAL HIGH (ref 3.4–10.8)

## 2018-04-08 LAB — AB SCR+ANTIBODY ID: ANTIBODY SCREEN: POSITIVE — AB

## 2018-04-26 ENCOUNTER — Other Ambulatory Visit: Payer: Self-pay

## 2018-04-26 ENCOUNTER — Emergency Department (HOSPITAL_COMMUNITY): Payer: Medicaid Other

## 2018-04-26 ENCOUNTER — Emergency Department (HOSPITAL_COMMUNITY)
Admission: EM | Admit: 2018-04-26 | Discharge: 2018-04-26 | Disposition: A | Discharge status: Home / Self Care | Payer: Medicaid Other | Attending: Emergency Medicine | Admitting: Emergency Medicine

## 2018-04-26 ENCOUNTER — Encounter (HOSPITAL_COMMUNITY): Payer: Self-pay | Admitting: Emergency Medicine

## 2018-04-26 DIAGNOSIS — Z3A01 Less than 8 weeks gestation of pregnancy: Secondary | ICD-10-CM | POA: Insufficient documentation

## 2018-04-26 DIAGNOSIS — L02411 Cutaneous abscess of right axilla: Secondary | ICD-10-CM | POA: Diagnosis not present

## 2018-04-26 DIAGNOSIS — Z79899 Other long term (current) drug therapy: Secondary | ICD-10-CM | POA: Diagnosis not present

## 2018-04-26 DIAGNOSIS — O200 Threatened abortion: Secondary | ICD-10-CM | POA: Diagnosis present

## 2018-04-26 DIAGNOSIS — O161 Unspecified maternal hypertension, first trimester: Secondary | ICD-10-CM | POA: Insufficient documentation

## 2018-04-26 DIAGNOSIS — L0291 Cutaneous abscess, unspecified: Secondary | ICD-10-CM

## 2018-04-26 DIAGNOSIS — O209 Hemorrhage in early pregnancy, unspecified: Secondary | ICD-10-CM

## 2018-04-26 HISTORY — DX: Disorder of kidney and ureter, unspecified: N28.9

## 2018-04-26 LAB — CBC WITH DIFFERENTIAL/PLATELET
BASOS ABS: 0 10*3/uL (ref 0.0–0.1)
Basophils Relative: 0 %
Eosinophils Absolute: 0.2 10*3/uL (ref 0.0–0.7)
Eosinophils Relative: 2 %
HEMATOCRIT: 45.6 % (ref 36.0–46.0)
HEMOGLOBIN: 15.5 g/dL — AB (ref 12.0–15.0)
LYMPHS PCT: 21 %
Lymphs Abs: 2.5 10*3/uL (ref 0.7–4.0)
MCH: 30.5 pg (ref 26.0–34.0)
MCHC: 34 g/dL (ref 30.0–36.0)
MCV: 89.8 fL (ref 78.0–100.0)
MONOS PCT: 6 %
Monocytes Absolute: 0.7 10*3/uL (ref 0.1–1.0)
NEUTROS ABS: 8.4 10*3/uL — AB (ref 1.7–7.7)
NEUTROS PCT: 71 %
Platelets: 263 10*3/uL (ref 150–400)
RBC: 5.08 MIL/uL (ref 3.87–5.11)
RDW: 12.8 % (ref 11.5–15.5)
WBC: 11.7 10*3/uL — ABNORMAL HIGH (ref 4.0–10.5)

## 2018-04-26 LAB — URINALYSIS, ROUTINE W REFLEX MICROSCOPIC
Bilirubin Urine: NEGATIVE
Glucose, UA: NEGATIVE mg/dL
KETONES UR: 5 mg/dL — AB
Leukocytes, UA: NEGATIVE
Nitrite: NEGATIVE
PH: 5 (ref 5.0–8.0)
Protein, ur: >=300 mg/dL — AB
RBC / HPF: >50 RBC/hpf — ABNORMAL HIGH (ref 0–5)
SPECIFIC GRAVITY, URINE: 1.019 (ref 1.005–1.030)

## 2018-04-26 LAB — WET PREP, GENITAL
SPERM: NONE SEEN
TRICH WET PREP: NONE SEEN
Yeast Wet Prep HPF POC: NONE SEEN

## 2018-04-26 LAB — BASIC METABOLIC PANEL
ANION GAP: 11 (ref 5–15)
BUN: 13 mg/dL (ref 6–20)
CHLORIDE: 103 mmol/L (ref 101–111)
CO2: 26 mmol/L (ref 22–32)
Calcium: 9.4 mg/dL (ref 8.9–10.3)
Creatinine, Ser: 0.89 mg/dL (ref 0.44–1.00)
GFR calc non Af Amer: >60 mL/min (ref >60)
GLUCOSE: 95 mg/dL (ref 65–99)
Potassium: 4.1 mmol/L (ref 3.5–5.1)
Sodium: 140 mmol/L (ref 135–145)

## 2018-04-26 LAB — I-STAT BETA HCG BLOOD, ED (MC, WL, AP ONLY): I-stat hCG, quantitative: 223 m[IU]/mL — ABNORMAL HIGH (ref <5)

## 2018-04-26 LAB — ANTIBODY SCREEN: Antibody Screen: POSITIVE

## 2018-04-26 MED ORDER — HYDROCODONE-ACETAMINOPHEN 5-325 MG PO TABS
1.0000 | ORAL_TABLET | Freq: Once | ORAL | Status: AC
  Administered 2018-04-26: 1 via ORAL
  Filled 2018-04-26: qty 1

## 2018-04-26 MED ORDER — MISOPROSTOL 200 MCG PO TABS
800.0000 ug | ORAL_TABLET | Freq: Once | ORAL | Status: AC
  Administered 2018-04-26: 800 ug via ORAL
  Filled 2018-04-26: qty 4

## 2018-04-26 MED ORDER — IBUPROFEN 600 MG PO TABS: 600.0000 mg | ORAL_TABLET | Freq: Four times a day (QID) | ORAL | 0 refills | Status: DC | PRN

## 2018-04-26 MED ORDER — HYDROCODONE-ACETAMINOPHEN 5-325 MG PO TABS: 1.0000 | ORAL_TABLET | Freq: Four times a day (QID) | ORAL | 0 refills | Status: DC | PRN

## 2018-04-26 NOTE — ED Provider Notes (Signed)
[facility] DEPT Provider Note   CSN: [id number] Arrival date & time: 04/26/18  1216     History   Chief Complaint Chief Complaint  Patient presents with  . Vaginal Bleeding  . Abdominal Pain  . Wound Check    HPI Natasha Long is a 22 y.o. female.  HPI Natasha Long is a 22 y.o. female, G2P 1, presents to emergency department complaint of vaginal bleeding.  Patient states she is approximately 12 weeks and 4 days pregnant.  Patient states she last had GYN visit about a month ago and everything was normal other than she did have some spotting at that time.  She states she has been having vaginal bleeding since she found out she was pregnant at gestation of 5 weeks.  She denies any vaginal discharge.  She is having abdominal cramping that comes and goes every 10 to 20 minutes.  Past Medical History:  Diagnosis Date  . Bipolar affective, manic ([place])   . Hypertension    diagnosed as child; stopped meds at 80 yo  . Hypoglycemia   . Insomnia   . Nephrotic syndrome   . Nephrotic syndrome   . PTSD (post-traumatic stress disorder)   . Renal disease     Patient Active Problem List   Diagnosis Date Noted  . 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 Chronic hypertension 04/04/2018  . Nephrotic syndrome 04/01/2018  . Supervision of high risk pregnancy, antepartum 04/01/2018  . Rh negative status during pregnancy 04/01/2018  . PTSD (post-traumatic stress disorder) 04/01/2018  . Bipolar affective disorder, depressed, mild ([place]) 04/09/2017  . Cocaine abuse ([place]) 04/09/2017    Past Surgical History:  Procedure Laterality Date  . CHOLECYSTECTOMY    . extraction of wisdom teeth    . RENAL BIOPSY       OB History    Gravida  2   Para  1   Term  1   Preterm      AB      Living  1     SAB      TAB      Ectopic      Multiple      Live Births  1            Home Medications    Prior to Admission  medications   Medication Sig Start Date End Date Taking? Authorizing Provider  gabapentin (NEURONTIN) 600 MG tablet Take 1 tablet (600 mg total) by mouth 3 (three) times daily. Patient taking differently: Take 600 mg by mouth 2 (two) times daily.  12/14/17  Yes Clent Demark, PA-C  Prenatal Vit-Fe Fumarate-FA (PRENATAL PO) Take 1 tablet by mouth daily.   Yes [provider]  ARIPiprazole (ABILIFY) 10 MG tablet Take 10 mg by mouth daily. 01/26/18   [provider]  Doxylamine-Pyridoxine 10-10 MG TBEC Take 10 mg by mouth 4 (four) times daily. One in the morning, one mid afternoon, two at bedtime Patient not taking: Reported on 04/26/2018 04/01/18   Gwen Pounds, CNM  escitalopram (LEXAPRO) 20 MG tablet Take 20 mg by mouth daily. 12/14/17   [provider]  hydrOXYzine (ATARAX/VISTARIL) 50 MG tablet Take 50 mg by mouth 3 (three) times daily. 12/29/17   [provider]  mirtazapine (REMERON) 30 MG tablet Take 30 mg by mouth at bedtime. 01/26/18   [provider]    Family History Family History  Problem Relation Age of Onset  . Diabetes Other   .  Hypertension Other     Social History Social History   Tobacco Use  . Smoking status: Never Smoker  . Smokeless tobacco: Never Used  Substance Use Topics  . Alcohol use: No  . Drug use: No     Allergies   Prednisone; Prozac [fluoxetine hcl]; and Wellbutrin [bupropion]   Review of Systems Review of Systems  Constitutional: Negative for chills and fever.  Respiratory: Negative for cough, chest tightness and shortness of breath.   Cardiovascular: Negative for chest pain, palpitations and leg swelling.  Gastrointestinal: Positive for abdominal pain. Negative for diarrhea, nausea and vomiting.  Genitourinary: Positive for pelvic pain and vaginal bleeding. Negative for dysuria, flank pain, vaginal discharge and vaginal pain.  Musculoskeletal: Negative for arthralgias, myalgias, neck pain and neck  stiffness.  Skin: Negative for rash.  Neurological: Negative for dizziness, weakness and headaches.  All other systems reviewed and are negative.    Physical Exam Updated Vital Signs BP (!) 133/98   Pulse 68   Temp 99 F (37.2 C) (Oral)   Resp 16   LMP 01/28/2018 (Exact Date)   SpO2 100%   Physical Exam  Constitutional: She appears well-developed and well-nourished. No distress.  HENT:  Head: Normocephalic.  Eyes: Conjunctivae are normal.  Neck: Neck supple.  Cardiovascular: Normal rate, regular rhythm and normal heart sounds.  Pulmonary/Chest: Effort normal and breath sounds normal. No respiratory distress. She has no wheezes. She has no rales.  Abdominal: Soft. Bowel sounds are normal. She exhibits no distension. There is no tenderness. There is no rebound.  Genitourinary:  Genitourinary Comments: Normal external genitalia.  Moderate vaginal bleeding in vaginal canal.  Cervix is closed.  Musculoskeletal: She exhibits no edema.  Neurological: She is alert.  Skin: Skin is warm and dry.  Psychiatric: She has a normal mood and affect. Her behavior is normal.  Nursing note and vitals reviewed.    ED Treatments / Results  Labs (all labs ordered are listed, but only abnormal results are displayed) Labs Reviewed  WET PREP, GENITAL - Abnormal; Notable for the following components:      Result Value   Clue Cells Wet Prep HPF POC PRESENT (*)    WBC, Wet Prep HPF POC FEW (*)    All other components within normal limits  CBC WITH DIFFERENTIAL/PLATELET - Abnormal; Notable for the following components:   WBC 11.7 (*)    Hemoglobin 15.5 (*)    Neutro Abs 8.4 (*)    All other components within normal limits  URINALYSIS, ROUTINE W REFLEX MICROSCOPIC - Abnormal; Notable for the following components:   APPearance HAZY (*)    Hgb urine dipstick LARGE (*)    Ketones, ur 5 (*)    Protein, ur >=300 (*)    RBC / HPF >50 (*)    Bacteria, UA RARE (*)    All other components within  normal limits  I-STAT BETA HCG BLOOD, ED ([facility], [facility], [facility] ONLY) - Abnormal; Notable for the following components:   I-stat hCG, quantitative 223.0 (*)    All other components within normal limits  BASIC METABOLIC PANEL  ANTIBODY SCREEN  GC/CHLAMYDIA PROBE AMP ([place]) NOT AT [facility]    EKG None  Radiology US Ob Comp < 14 Wks  Addendum Date: 04/26/2018   ADDENDUM REPORT: 04/26/2018 15:40 ADDENDUM: The submitted report is incorrect and due to my mismatch of an ultrasound work sheet. The corrected report is as follows: CLINICAL DATA:  Vaginal spotting, abdominal and pelvic cramping, and morbid  obesity. Quantitative beta HCG 9,601 EXAM: Obstetric less than 14 week transabdominal ultrasound and transvaginal Ob ultrasound TECHNIQUE: Both transabdominal and transvaginal ultrasound examinations were performed for complete evaluation of the gestational as well as the maternal uterus, adnexal regions, and pelvic cul-de-sac. Transvaginal technique was performed to assess early pregnancy. COMPARISON:  Pelvic ultrasound of March 16, 2018 FINDINGS: Intrauterine gestational sac present, single Yolk sac: Not visualized Embryo: Single Cardiac activity: None visualized Crown-rump length: 2.13 cm, corresponds to 8 week 5 day gestation, estimated date of confinement of December 01, 2018 Subchorionic hemorrhage: None Right ovary and left ovary: Not visualized. No free fluid visualized in the cul de sac. IMPRESSION: IMPRESSION: Findings compatible with a nonviable pregnancy. No fetal cardiac activity observed. Inadequate interval growth since the previous ultrasound. The findings were discussed by telephone with Ms. Marc Morgans, [place], at approximately 3:30 p.m. on 27 March 2018. I apologize for the confusion that my report mix-up has caused. Electronically Signed   By: David  Martinique M.D.   On: 04/26/2018 15:40   Result Date: 04/26/2018 CLINICAL DATA:  Vaginal spotting, abdominal and pelvic cramping, morbid obesity. Quantitative  beta HCG 9,601 EXAM: OBSTETRIC <14 WK [place] AND TRANSVAGINAL OB US TECHNIQUE: Both transabdominal and transvaginal ultrasound examinations were performed for complete evaluation of the gestation as well as the maternal uterus, adnexal regions, and pelvic cul-de-sac. Transvaginal technique was performed to assess early pregnancy. COMPARISON:  Pelvic ultrasound of March 16, 2018 FINDINGS: Intrauterine gestational sac: Present Yolk sac:  Present Embryo:  Present Cardiac Activity: Present Heart Rate: 115 bpm CRL:  7 mm   6 w   3 d                  [place] [facility]: November 06, 2018 Subchorionic hemorrhage:  None visualized. Maternal uterus/adnexae: No abnormalities Heart Rate:  115 bpm IMPRESSION: Single viable IUP with estimated gestational age of [redacted] weeks 3 days which is similar to the measurements obtained on the preceding study. No subchorionic hemorrhage or adnexal masses. Electronically Signed: By: David  Martinique M.D. On: 04/26/2018 14:52   US Ob Transvaginal  Addendum Date: 04/26/2018   ADDENDUM REPORT: 04/26/2018 15:40 ADDENDUM: The submitted report is incorrect and due to my mismatch of an ultrasound work sheet. The corrected report is as follows: CLINICAL DATA:  Vaginal spotting, abdominal and pelvic cramping, and morbid obesity. Quantitative beta HCG 9,601 EXAM: Obstetric less than 14 week transabdominal ultrasound and transvaginal Ob ultrasound TECHNIQUE: Both transabdominal and transvaginal ultrasound examinations were performed for complete evaluation of the gestational as well as the maternal uterus, adnexal regions, and pelvic cul-de-sac. Transvaginal technique was performed to assess early pregnancy. COMPARISON:  Pelvic ultrasound of March 16, 2018 FINDINGS: Intrauterine gestational sac present, single Yolk sac: Not visualized Embryo: Single Cardiac activity: None visualized Crown-rump length: 2.13 cm, corresponds to 8 week 5 day gestation, estimated date of confinement of December 01, 2018 Subchorionic hemorrhage:  None Right ovary and left ovary: Not visualized. No free fluid visualized in the cul de sac. IMPRESSION: IMPRESSION: Findings compatible with a nonviable pregnancy. No fetal cardiac activity observed. Inadequate interval growth since the previous ultrasound. The findings were discussed by telephone with Ms. Marc Morgans, [place], at approximately 3:30 p.m. on 27 March 2018. I apologize for the confusion that my report mix-up has caused. Electronically Signed   By: David  Martinique M.D.   On: 04/26/2018 15:40   Result Date: 04/26/2018 CLINICAL DATA:  Vaginal spotting, abdominal and pelvic cramping, morbid obesity. Quantitative beta HCG 9,601  EXAM: OBSTETRIC <14 WK [place] AND TRANSVAGINAL OB US TECHNIQUE: Both transabdominal and transvaginal ultrasound examinations were performed for complete evaluation of the gestation as well as the maternal uterus, adnexal regions, and pelvic cul-de-sac. Transvaginal technique was performed to assess early pregnancy. COMPARISON:  Pelvic ultrasound of March 16, 2018 FINDINGS: Intrauterine gestational sac: Present Yolk sac:  Present Embryo:  Present Cardiac Activity: Present Heart Rate: 115 bpm CRL:  7 mm   6 w   3 d                  [place] [facility]: November 06, 2018 Subchorionic hemorrhage:  None visualized. Maternal uterus/adnexae: No abnormalities Heart Rate:  115 bpm IMPRESSION: Single viable IUP with estimated gestational age of [redacted] weeks 3 days which is similar to the measurements obtained on the preceding study. No subchorionic hemorrhage or adnexal masses. Electronically Signed: By: David  Martinique M.D. On: 04/26/2018 14:52    Procedures Procedures (including critical care time)  Medications Ordered in ED Medications  HYDROcodone-acetaminophen (NORCO/VICODIN) 5-325 MG per tablet 1 tablet (has no administration in time range)     Initial Impression / Assessment and Plan / ED Course  I have reviewed the triage vital signs and the nursing notes.  Pertinent labs & imaging results that were  available during my care of the patient were reviewed by me and considered in my medical decision making (see chart for details).  Clinical Course as of Apr 26 1632  Tue Apr 26, 2018  1625 Antibody screen: if negative then needs rhogam, if positive then fine to go.       [EH]    Clinical Course User Index [EH] Lorin Glass, PA-C    Patient emergency department with pelvic cramping and vaginal bleeding.  States she is approximately 12 weeks 4 days pregnant.  She has had bleeding for multiple weeks during this pregnancy but states he got heavy in the last week.  We will do a pelvic exam, labs, ultrasound.  Patient is A- blood type with positive antibodies checked just 3 weeks ago.  4:03 PM US showing non viable pregnancy. Discussed with Dr. Marvia Pickles, will treat with cytotec and follow up with them in the clinic. Check Antibody, if negative, give rhogam. Discussed results with pt who understands. Home with pain medications.   PT is hypertensive, but currently crying and states hurting. Will need close bp recheck.    Vitals:   04/26/18 1224 04/26/18 1355  BP: (!) 133/98 (!) 154/92  Pulse: 68 64  Resp: 16 18  Temp: 99 F (37.2 C)   TempSrc: Oral   SpO2: 100% 99%     Final Clinical Impressions(s) / ED Diagnoses   Final diagnoses:  Threatened miscarriage  Elevated blood pressure complicating pregnancy, antepartum, first trimester    ED Discharge Orders        Ordered    HYDROcodone-acetaminophen (NORCO) 5-325 MG tablet  Every 6 hours PRN     04/26/18 1630    ibuprofen (ADVIL,MOTRIN) 600 MG tablet  Every 6 hours PRN     04/26/18 1630       Lawren Sexson, [place], PA-C 04/26/18 1634    Daleen Bo, MD 04/26/18 1902

## 2018-04-26 NOTE — Discharge Instructions (Addendum)
Take cytotec as prescribed. You will experience increased bleeding and cramping. Please follow up with womens clinic in 2 days. Take pain medications as prescribed as needed. Please use warm compresses to help your abscess in your right arm drain.   You are being prescribed a medication which may make you sleepy. For 24 hours after one dose please do not drive, operate heavy machinery, care for a small child with out another adult present, or perform any activities that may cause harm to you or someone else if you were to fall asleep or be impaired.   Please make sure you are taking care of your self physically.  Make sure you are eating and staying well hydrated.

## 2018-04-26 NOTE — ED Triage Notes (Signed)
Patient is 12 weeks 4 days pregnant presenting with cramping and bleeding x 1 week. Says she has had this problem earlier in pregnancy, not with this pain, but [place] was fine. Abdominal pain is low and seems to be cyclic in nature. Pt also has a small open wound in right axilla that appears to be draining. 133/98

## 2018-04-26 NOTE — Progress Notes (Signed)
   04/26/18 1500  Clinical Encounter Type  Visited With Patient  Visit Type Initial;Psychological support;Spiritual support;ED  Referral From Nurse  Consult/Referral To Chaplain  Spiritual Encounters  Spiritual Needs Emotional;Other (Comment) (Spiritual Care Conversation/Support)  Stress Factors  Patient Stress Factors Not reviewed   The patient stated that she doesn't want to speak to anyone at this time.  Please, contact Spiritual Care for further assistance.  Chaplain Shanon Ace M.Div., [facility]

## 2018-04-26 NOTE — ED Notes (Signed)
Patient states every 20 min she gets a tightness in her abdominal area. She states it is similar to labor contractions . Patient last had an ultrasound around 5 weeks. Patient has had vaginal bleeding and cramping x1 week.

## 2018-04-26 NOTE — ED Provider Notes (Signed)
Patient presents today for evaluation of continued vaginal bleeding, please see note for Tatyana Kirichenko PA-C for full H&P.  Briefly patient appears to have had a missed abortion.  She has been given Cytotec per recommendations of OB/GYN which she took while here.  She is to follow-up with OB/GYN.  Plan is to follow-up on antibody screen, if negative needs RhoGam, if positive is okay to go home. Physical Exam  BP 129/72 (BP Location: Right Arm)   Pulse (!) 57   Temp 98.5 F (36.9 C) (Oral)   Resp 16   LMP 01/28/2018 (Exact Date)   SpO2 99%   Physical Exam  Constitutional: She is oriented to person, place, and time. She appears well-developed and well-nourished.  Non-toxic appearance. No distress.  HENT:  Head: Normocephalic.  Eyes: Conjunctivae are normal.  Pulmonary/Chest: Effort normal. No respiratory distress.  Neurological: She is alert and oriented to person, place, and time.  Skin: Skin is warm and dry.  Psychiatric:  Tearful, upset  Nursing note and vitals reviewed.   ED Course/Procedures   Clinical Course as of Apr 27 1827  Tue Apr 26, 2018  1625 Antibody screen: if negative then needs rhogam, if positive then fine to go.       [EH]    Clinical Course User Index [EH] Lorin Glass, PA-C    Procedures  MDM   Patient presents today for evaluation of continued vaginal bleeding in the setting of pregnancy, ultrasound consistent with a missed abortion.  Antibody screen is positive, therefore patient does not need RhoGam.  She took the Cytotec while in the emergency room.  Return precautions were discussed and she states her understanding.  Patient discharged home.      Lorin Glass, PA-C 04/26/18 1831    Julianne Rice, MD 04/26/18 2213

## 2018-04-27 LAB — GC/CHLAMYDIA PROBE AMP (~~LOC~~) NOT AT ARMC
CHLAMYDIA, DNA PROBE: NEGATIVE
NEISSERIA GONORRHEA: NEGATIVE

## 2018-04-29 ENCOUNTER — Telehealth: Payer: Self-pay | Admitting: General Practice

## 2018-04-29 ENCOUNTER — Encounter: Payer: Medicaid Other | Admitting: Family

## 2018-04-29 NOTE — Telephone Encounter (Signed)
Notified patient of follow up on 05/05/18 at 4:10pm.  Patient verbalized understanding.

## 2018-05-02 ENCOUNTER — Inpatient Hospital Stay (HOSPITAL_COMMUNITY): Admission: RE | Admit: 2018-05-02 | Payer: Medicaid Other | Source: Ambulatory Visit

## 2018-05-02 ENCOUNTER — Encounter (HOSPITAL_COMMUNITY): Payer: Medicaid Other

## 2018-05-02 ENCOUNTER — Other Ambulatory Visit (HOSPITAL_COMMUNITY): Payer: Medicaid Other

## 2018-05-05 ENCOUNTER — Ambulatory Visit: Payer: Medicaid Other | Admitting: Obstetrics and Gynecology

## 2018-05-05 ENCOUNTER — Telehealth: Payer: Self-pay | Admitting: General Practice

## 2018-05-05 NOTE — Telephone Encounter (Signed)
Called patient to reschedule SAB follow up d/t patient needing Stat HCG.  Advised patient to go to MAU for blood draw, per Provider. Patient hung up.  Attempted to call patient, but no answer.  Left message on VM for patient  to return call.

## 2018-05-27 ENCOUNTER — Telehealth: Payer: Self-pay | Admitting: General Practice

## 2018-05-27 NOTE — Telephone Encounter (Signed)
Patient had SAB around 04/26/18 and was encouraged to go to [facility] MAU for Stat HCG and follow up here in the office.  Message was left for patient to give our office a call.  Patient called today and stated that she was returning call from 05/05/18.  Patient was concerned about making sure everything was ok with her.  Advised patient to go to MAU to be evaluated.  She stated that it takes too long at [facility] and that she was going to [facility] ED.

## 2018-09-13 ENCOUNTER — Emergency Department (HOSPITAL_COMMUNITY)
Admission: EM | Admit: 2018-09-13 | Discharge: 2018-09-13 | Disposition: A | Discharge status: Home / Self Care | Payer: Medicaid Other | Attending: Emergency Medicine | Admitting: Emergency Medicine

## 2018-09-13 ENCOUNTER — Encounter (HOSPITAL_COMMUNITY): Payer: Self-pay | Admitting: *Deleted

## 2018-09-13 ENCOUNTER — Other Ambulatory Visit: Payer: Self-pay

## 2018-09-13 DIAGNOSIS — I10 Essential (primary) hypertension: Secondary | ICD-10-CM | POA: Diagnosis not present

## 2018-09-13 DIAGNOSIS — L02411 Cutaneous abscess of right axilla: Secondary | ICD-10-CM | POA: Insufficient documentation

## 2018-09-13 DIAGNOSIS — F17210 Nicotine dependence, cigarettes, uncomplicated: Secondary | ICD-10-CM | POA: Insufficient documentation

## 2018-09-13 DIAGNOSIS — L02412 Cutaneous abscess of left axilla: Secondary | ICD-10-CM | POA: Diagnosis not present

## 2018-09-13 DIAGNOSIS — Z79899 Other long term (current) drug therapy: Secondary | ICD-10-CM | POA: Insufficient documentation

## 2018-09-13 DIAGNOSIS — L02419 Cutaneous abscess of limb, unspecified: Secondary | ICD-10-CM

## 2018-09-13 MED ORDER — DOXYCYCLINE HYCLATE 100 MG PO CAPS: 100.0000 mg | ORAL_CAPSULE | Freq: Two times a day (BID) | ORAL | 0 refills | Status: DC

## 2018-09-13 MED ORDER — LIDOCAINE-EPINEPHRINE (PF) 2 %-1:200000 IJ SOLN
10.0000 mL | Freq: Once | INTRAMUSCULAR | Status: AC
  Administered 2018-09-13: 10 mL
  Filled 2018-09-13: qty 20

## 2018-09-13 NOTE — Discharge Instructions (Signed)
Take antibiotics as directed.  Use dressing as this may continue to ooze a little bit.  Rinse 2-3 times daily with clean warm water and also use warm compresses to help promote continued drainage and healing.  Please follow-up with your primary care doctor regarding these abscesses.  Return to the emergency department for fevers worsening redness swelling pain or increased drainage or any other new or concerning symptoms.

## 2018-09-13 NOTE — ED Notes (Signed)
Bed: WTR8 Expected date:  Expected time:  Means of arrival:  Comments: 

## 2018-09-13 NOTE — ED Provider Notes (Signed)
[facility] DEPT Provider Note   CSN: [id number] Arrival date & time: 09/13/18  1220     History   Chief Complaint Chief Complaint  Patient presents with  . Abscess    HPI Natasha Long is a 22 y.o. female.  Natasha Long is a 22 y.o. Female with a history of hypertension, nephrotic syndrome and bipolar affective disorder, who presents to the emergency department for evaluation of abscesses to bilateral axilla.  She reports that the ones on the right side has been present for about 3 weeks and the ones on the left have been present for about a week, this started small but have been increasing in size and are now red and warm to the touch.  She reports the right one has had some drainage intermittently.  It has become increasingly painful.  No red streaking onto the arm.  No fevers or chills, no nausea or vomiting.  She does have history of abscesses before but never in this location.  She has not seen her primary care doctor regarding these has not been on any antibiotics, no other medications to treat symptoms, no other aggravating or alleviating factors.     Past Medical History:  Diagnosis Date  . Bipolar affective, manic ([place])   . Hypertension    diagnosed as child; stopped meds at 73 yo  . Hypoglycemia   . Insomnia   . Nephrotic syndrome   . Nephrotic syndrome   . PTSD (post-traumatic stress disorder)   . Renal disease     Patient Active Problem List   Diagnosis Date Noted  . 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 Chronic hypertension 04/04/2018  . Nephrotic syndrome 04/01/2018  . Supervision of high risk pregnancy, antepartum 04/01/2018  . Rh negative status during pregnancy 04/01/2018  . PTSD (post-traumatic stress disorder) 04/01/2018  . Bipolar affective disorder, depressed, mild ([place]) 04/09/2017  . Cocaine abuse ([place]) 04/09/2017    Past Surgical History:  Procedure Laterality Date  . CHOLECYSTECTOMY      . extraction of wisdom teeth    . RENAL BIOPSY       OB History    Gravida  2   Para  1   Term  1   Preterm      AB      Living  1     SAB      TAB      Ectopic      Multiple      Live Births  1            Home Medications    Prior to Admission medications   Medication Sig Start Date End Date Taking? Authorizing Provider  ARIPiprazole (ABILIFY) 10 MG tablet Take 10 mg by mouth daily. 01/26/18   [provider]  Doxylamine-Pyridoxine 10-10 MG TBEC Take 10 mg by mouth 4 (four) times daily. One in the morning, one mid afternoon, two at bedtime Patient not taking: Reported on 04/26/2018 04/01/18   Gwen Pounds, CNM  escitalopram (LEXAPRO) 20 MG tablet Take 20 mg by mouth daily. 12/14/17   [provider]  gabapentin (NEURONTIN) 600 MG tablet Take 1 tablet (600 mg total) by mouth 3 (three) times daily. Patient taking differently: Take 600 mg by mouth 2 (two) times daily.  12/14/17   Clent Demark, PA-C  HYDROcodone-acetaminophen [facility]) 5-325 MG tablet Take 1 tablet by mouth every 6 (six) hours as needed for moderate pain. 04/26/18   Kirichenko,  Tatyana, PA-C  hydrOXYzine (ATARAX/VISTARIL) 50 MG tablet Take 50 mg by mouth 3 (three) times daily. 12/29/17   [provider]  ibuprofen (ADVIL,MOTRIN) 600 MG tablet Take 1 tablet (600 mg total) by mouth every 6 (six) hours as needed. 04/26/18   Kirichenko, Tatyana, PA-C  mirtazapine (REMERON) 30 MG tablet Take 30 mg by mouth at bedtime. 01/26/18   [provider]  Prenatal Vit-Fe Fumarate-FA (PRENATAL PO) Take 1 tablet by mouth daily.    [provider]    Family History Family History  Problem Relation Age of Onset  . Diabetes Other   . Hypertension Other     Social History Social History   Tobacco Use  . Smoking status: Light Tobacco Smoker    Types: Cigarettes  . Smokeless tobacco: Never Used  Substance Use Topics  . Alcohol use: No  . Drug use: No      Allergies   Prednisone; Prozac [fluoxetine hcl]; and Wellbutrin [bupropion]   Review of Systems Review of Systems  Constitutional: Negative for chills and fever.  Gastrointestinal: Negative for nausea and vomiting.  Skin: Positive for color change.       Abscess  Hematological: Positive for adenopathy.     Physical Exam Updated Vital Signs BP 132/75 (BP Location: Right Arm)   Pulse 79   Temp 98.5 F (36.9 C) (Oral)   Resp 16   Ht 5\' 8"  (1.727 m)   Wt (!) 141.1 kg   LMP 04/11/2018 (Exact Date)   SpO2 98%   Breastfeeding? Unknown   BMI 47.29 kg/m   Physical Exam  Constitutional: She appears well-developed and well-nourished. No distress.  HENT:  Head: Normocephalic and atraumatic.  Eyes: Right eye exhibits no discharge. Left eye exhibits no discharge.  Pulmonary/Chest: Effort normal. No respiratory distress.  Neurological: She is alert. Coordination normal.  Skin: Skin is warm and dry. Capillary refill takes less than 2 seconds. She is not diaphoretic.  Several areas of induration and fluctuance in bilateral axilla, there is a small amount of expressible drainage in the right axilla.  There is overlying erythema and warmth but no lymphangitic streaking.  Some surrounding lymphadenopathy.  Psychiatric: She has a normal mood and affect. Her behavior is normal.  Nursing note and vitals reviewed.    ED Treatments / Results  Labs (all labs ordered are listed, but only abnormal results are displayed) Labs Reviewed - No data to display  EKG None  Radiology No results found.  Procedures  EMERGENCY DEPARTMENT US SOFT TISSUE INTERPRETATION "Study: Limited Soft Tissue Ultrasound"  INDICATIONS: Soft tissue infection Multiple views of the body part were obtained in real-time with a multi-frequency linear probe  PERFORMED BY: Myself IMAGES ARCHIVED?: Yes SIDE:Left and Right  BODY PART:Axilla INTERPRETATION:  Abcess present and Cellulitis  present     .Marland KitchenIncision and Drainage Date/Time: 09/13/2018 3:07 PM Performed by: Jacqlyn Larsen, PA-C Authorized by: Jacqlyn Larsen, PA-C   Consent:    Consent obtained:  Verbal   Consent given by:  Patient   Risks discussed:  Bleeding, damage to other organs, infection and incomplete drainage   Alternatives discussed:  No treatment Location:    Type:  Abscess   Size:  2 x 3cm   Location:  Upper extremity (right axilla) Pre-procedure details:    Skin preparation:  Chloraprep Anesthesia (see MAR for exact dosages):    Anesthesia method:  Local infiltration   Local anesthetic:  Lidocaine 2% WITH epi Procedure details:  Incision types:  Single straight   Incision depth:  Dermal   Scalpel blade:  11   Wound management:  Probed and deloculated and irrigated with saline   Drainage:  Bloody and purulent   Drainage amount:  Moderate   Packing materials:  None Post-procedure details:    Patient tolerance of procedure:  Tolerated well, no immediate complications .Marland KitchenIncision and Drainage Date/Time: 09/13/2018 3:09 PM Performed by: Jacqlyn Larsen, PA-C Authorized by: Jacqlyn Larsen, PA-C   Consent:    Consent obtained:  Verbal   Consent given by:  Patient   Risks discussed:  Bleeding, damage to other organs, infection, incomplete drainage and pain   Alternatives discussed:  No treatment Location:    Type:  Abscess   Size:  2 x 3   Location:  Upper extremity (Left axilla) Pre-procedure details:    Skin preparation:  Chloraprep Anesthesia (see MAR for exact dosages):    Anesthesia method:  Local infiltration   Local anesthetic:  Lidocaine 2% WITH epi Procedure details:    Incision types:  Single straight   Incision depth:  Dermal   Scalpel blade:  11   Wound management:  Probed and deloculated and irrigated with saline   Drainage:  Bloody and purulent   Drainage amount:  Moderate   Wound treatment:  Wound left open   Packing materials:  None Post-procedure details:     Patient tolerance of procedure:  Tolerated well, no immediate complications   (including critical care time)  Medications Ordered in ED Medications  lidocaine-EPINEPHrine (XYLOCAINE W/EPI) 2 %-1:200000 (PF) injection 10 mL (has no administration in time range)     Initial Impression / Assessment and Plan / ED Course  I have reviewed the triage vital signs and the nursing notes.  Pertinent labs & imaging results that were available during my care of the patient were reviewed by me and considered in my medical decision making (see chart for details).  Patient with skin abscesses amenable to incision and drainage in bilateral axillae.  Abscess was not large enough to warrant packing or drain,  wound recheck in 2 days. Encouraged home warm soaks and flushing.  M there is surrounding cellulitis and induration so we will place patient on doxycycline we will have her follow-up with her primary care doctor.  Signs of worsening infection and return precautions been discussed.  Patient expresses understanding and is in agreement with plan.  Stable for discharge home at this time.  Final Clinical Impressions(s) / ED Diagnoses   Final diagnoses:  Abscess of axilla    ED Discharge Orders         Ordered    doxycycline (VIBRAMYCIN) 100 MG capsule  2 times daily     09/13/18 1457           Jacqlyn Larsen, [place] 09/13/18 1512    Maudie Flakes, MD 09/13/18 1523

## 2018-09-13 NOTE — ED Triage Notes (Signed)
Pt reports some "bumps" bilaterally in the axilla area. Pt reports that two months ago she began using [employer] and that is when the bumps first appeared.  Since then she has been using more hypoallergenic products in the area.

## 2018-09-15 ENCOUNTER — Ambulatory Visit: Payer: Medicaid Other | Admitting: Obstetrics and Gynecology

## 2018-10-01 ENCOUNTER — Other Ambulatory Visit: Payer: Self-pay

## 2018-10-01 ENCOUNTER — Emergency Department (HOSPITAL_COMMUNITY)
Admission: EM | Admit: 2018-10-01 | Discharge: 2018-10-01 | Disposition: A | Discharge status: Home / Self Care | Payer: Medicaid Other | Attending: Emergency Medicine | Admitting: Emergency Medicine

## 2018-10-01 ENCOUNTER — Encounter (HOSPITAL_COMMUNITY): Payer: Self-pay | Admitting: *Deleted

## 2018-10-01 DIAGNOSIS — Z79899 Other long term (current) drug therapy: Secondary | ICD-10-CM | POA: Insufficient documentation

## 2018-10-01 DIAGNOSIS — I10 Essential (primary) hypertension: Secondary | ICD-10-CM | POA: Diagnosis not present

## 2018-10-01 DIAGNOSIS — R51 Headache: Secondary | ICD-10-CM | POA: Insufficient documentation

## 2018-10-01 DIAGNOSIS — R110 Nausea: Secondary | ICD-10-CM | POA: Insufficient documentation

## 2018-10-01 DIAGNOSIS — F17210 Nicotine dependence, cigarettes, uncomplicated: Secondary | ICD-10-CM | POA: Diagnosis not present

## 2018-10-01 DIAGNOSIS — R6889 Other general symptoms and signs: Secondary | ICD-10-CM

## 2018-10-01 DIAGNOSIS — F319 Bipolar disorder, unspecified: Secondary | ICD-10-CM | POA: Insufficient documentation

## 2018-10-01 DIAGNOSIS — R5383 Other fatigue: Secondary | ICD-10-CM | POA: Insufficient documentation

## 2018-10-01 LAB — URINALYSIS, ROUTINE W REFLEX MICROSCOPIC
Bilirubin Urine: NEGATIVE
Glucose, UA: NEGATIVE mg/dL
Hgb urine dipstick: NEGATIVE
KETONES UR: NEGATIVE mg/dL
Leukocytes, UA: NEGATIVE
Nitrite: NEGATIVE
PH: 5 (ref 5.0–8.0)
Protein, ur: >=300 mg/dL — AB
Specific Gravity, Urine: 1.022 (ref 1.005–1.030)

## 2018-10-01 LAB — CBC WITH DIFFERENTIAL/PLATELET
Abs Immature Granulocytes: 0.04 10*3/uL (ref 0.00–0.07)
BASOS PCT: 0 %
Basophils Absolute: 0 10*3/uL (ref 0.0–0.1)
EOS ABS: 0.2 10*3/uL (ref 0.0–0.5)
EOS PCT: 1 %
HEMATOCRIT: 46.5 % — AB (ref 36.0–46.0)
Hemoglobin: 15.3 g/dL — ABNORMAL HIGH (ref 12.0–15.0)
Immature Granulocytes: 0 %
LYMPHS ABS: 3.2 10*3/uL (ref 0.7–4.0)
Lymphocytes Relative: 23 %
MCH: 29.6 pg (ref 26.0–34.0)
MCHC: 32.9 g/dL (ref 30.0–36.0)
MCV: 89.9 fL (ref 80.0–100.0)
MONOS PCT: 5 %
Monocytes Absolute: 0.7 10*3/uL (ref 0.1–1.0)
NEUTROS PCT: 71 %
Neutro Abs: 9.7 10*3/uL — ABNORMAL HIGH (ref 1.7–7.7)
PLATELETS: 282 10*3/uL (ref 150–400)
RBC: 5.17 MIL/uL — ABNORMAL HIGH (ref 3.87–5.11)
RDW: 12.8 % (ref 11.5–15.5)
WBC: 13.8 10*3/uL — AB (ref 4.0–10.5)
nRBC: 0 % (ref 0.0–0.2)

## 2018-10-01 LAB — BASIC METABOLIC PANEL
Anion gap: 8 (ref 5–15)
BUN: 15 mg/dL (ref 6–20)
CALCIUM: 9.2 mg/dL (ref 8.9–10.3)
CO2: 26 mmol/L (ref 22–32)
CREATININE: 1 mg/dL (ref 0.44–1.00)
Chloride: 103 mmol/L (ref 98–111)
GFR calc Af Amer: >60 mL/min (ref >60)
GLUCOSE: 111 mg/dL — AB (ref 70–99)
Potassium: 4 mmol/L (ref 3.5–5.1)
Sodium: 137 mmol/L (ref 135–145)

## 2018-10-01 LAB — I-STAT BETA HCG BLOOD, ED (MC, WL, AP ONLY): I-stat hCG, quantitative: <5 m[IU]/mL (ref <5)

## 2018-10-01 MED ORDER — OSELTAMIVIR PHOSPHATE 75 MG PO CAPS: 75.0000 mg | ORAL_CAPSULE | Freq: Two times a day (BID) | ORAL | 0 refills | Status: DC

## 2018-10-01 MED ORDER — ONDANSETRON 4 MG PO TBDP: 4.0000 mg | ORAL_TABLET | Freq: Three times a day (TID) | ORAL | 0 refills | Status: DC | PRN

## 2018-10-01 MED ORDER — BENZONATATE 100 MG PO CAPS: 100.0000 mg | ORAL_CAPSULE | Freq: Three times a day (TID) | ORAL | 0 refills | Status: DC

## 2018-10-01 MED ORDER — ONDANSETRON 4 MG PO TBDP
4.0000 mg | ORAL_TABLET | Freq: Once | ORAL | Status: AC
  Administered 2018-10-01: 4 mg via ORAL
  Filled 2018-10-01: qty 1

## 2018-10-01 NOTE — ED Notes (Signed)
Patient refused Flu PCR

## 2018-10-01 NOTE — ED Provider Notes (Signed)
[facility] DEPT Provider Note   CSN: [id number] Arrival date & time: 10/01/18  1717     History   Chief Complaint Chief Complaint  Patient presents with  . Nausea  . Fatigue    HPI Natasha Long is a 22 y.o. female who presents to the ED with nausea and fatigue, headache and aching feeling all over. Feeling like the flu is starting.  Symptoms started last night. Patient reports she just wanted to catch what ever it is early so she does not miss a lot of work.   HPI  Past Medical History:  Diagnosis Date  . Bipolar affective, manic ([place])   . Hypertension    diagnosed as child; stopped meds at 65 yo  . Hypoglycemia   . Insomnia   . Nephrotic syndrome   . Nephrotic syndrome   . PTSD (post-traumatic stress disorder)   . Renal disease     Patient Active Problem List   Diagnosis Date Noted  . 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 Chronic hypertension 04/04/2018  . Nephrotic syndrome 04/01/2018  . Supervision of high risk pregnancy, antepartum 04/01/2018  . Rh negative status during pregnancy 04/01/2018  . PTSD (post-traumatic stress disorder) 04/01/2018  . Bipolar affective disorder, depressed, mild ([place]) 04/09/2017  . Cocaine abuse ([place]) 04/09/2017    Past Surgical History:  Procedure Laterality Date  . CHOLECYSTECTOMY    . extraction of wisdom teeth    . RENAL BIOPSY       OB History    Gravida  2   Para  1   Term  1   Preterm      AB      Living  1     SAB      TAB      Ectopic      Multiple      Live Births  1            Home Medications    Prior to Admission medications   Medication Sig Start Date End Date Taking? Authorizing Provider  ARIPiprazole (ABILIFY) 10 MG tablet Take 10 mg by mouth daily. 01/26/18   [provider]  doxycycline (VIBRAMYCIN) 100 MG capsule Take 1 capsule (100 mg total) by mouth 2 (two) times daily. One po bid x 7 days 09/13/18   Jacqlyn Larsen,  PA-C  Doxylamine-Pyridoxine 10-10 MG TBEC Take 10 mg by mouth 4 (four) times daily. One in the morning, one mid afternoon, two at bedtime Patient not taking: Reported on 04/26/2018 04/01/18   Carrolyn Leigh N, CNM  escitalopram (LEXAPRO) 20 MG tablet Take 20 mg by mouth daily. 12/14/17   [provider]  gabapentin (NEURONTIN) 600 MG tablet Take 1 tablet (600 mg total) by mouth 3 (three) times daily. Patient taking differently: Take 600 mg by mouth 2 (two) times daily.  12/14/17   Clent Demark, PA-C  HYDROcodone-acetaminophen [facility]) 5-325 MG tablet Take 1 tablet by mouth every 6 (six) hours as needed for moderate pain. 04/26/18   Kirichenko, Lahoma Rocker, PA-C  hydrOXYzine (ATARAX/VISTARIL) 50 MG tablet Take 50 mg by mouth 3 (three) times daily. 12/29/17   [provider]  ibuprofen (ADVIL,MOTRIN) 600 MG tablet Take 1 tablet (600 mg total) by mouth every 6 (six) hours as needed. 04/26/18   Kirichenko, Tatyana, PA-C  mirtazapine (REMERON) 30 MG tablet Take 30 mg by mouth at bedtime. 01/26/18   [provider]  ondansetron (ZOFRAN ODT) 4 MG disintegrating  tablet Take 1 tablet (4 mg total) by mouth every 8 (eight) hours as needed for nausea or vomiting. 10/01/18   Ashley Murrain, NP  oseltamivir (TAMIFLU) 75 MG capsule Take 1 capsule (75 mg total) by mouth every 12 (twelve) hours. 10/01/18   Ashley Murrain, NP  Prenatal Vit-Fe Fumarate-FA (PRENATAL PO) Take 1 tablet by mouth daily.    [provider]    Family History Family History  Problem Relation Age of Onset  . Diabetes Other   . Hypertension Other     Social History Social History   Tobacco Use  . Smoking status: Light Tobacco Smoker    Types: Cigarettes  . Smokeless tobacco: Never Used  Substance Use Topics  . Alcohol use: No  . Drug use: No     Allergies   Prednisone; Prozac [fluoxetine hcl]; and Wellbutrin [bupropion]   Review of Systems Review of Systems  Constitutional: Negative for  chills and fever.  HENT: Positive for congestion. Negative for ear pain, sinus pain and sore throat.   Eyes: Negative for redness and itching.  Respiratory: Positive for cough.   Cardiovascular: Negative for chest pain.  Gastrointestinal: Positive for nausea. Negative for abdominal pain and vomiting.  Genitourinary: Negative for dysuria, frequency and urgency.  Musculoskeletal: Positive for myalgias.  Skin: Negative for rash.  Neurological: Positive for headaches.  Hematological: Negative for adenopathy.  Psychiatric/Behavioral: Negative for confusion.     Physical Exam Updated Vital Signs BP 139/84 (BP Location: Right Arm)   Pulse 74   Temp 98.5 F (36.9 C) (Oral)   Resp 18   Ht 5\' 8"  (1.727 m)   Wt (!) 141.1 kg   LMP 01/28/2018 (Exact Date)   SpO2 98%   BMI 47.29 kg/m   Physical Exam  Constitutional: No distress.  Obese   HENT:  Head: Normocephalic.  Right Ear: Tympanic membrane normal.  Left Ear: Tympanic membrane normal.  Nose: Mucosal edema and rhinorrhea present.  Mouth/Throat: Uvula is midline. No posterior oropharyngeal edema or posterior oropharyngeal erythema.  Eyes: Conjunctivae and EOM are normal.  Neck: Normal range of motion. Neck supple.  Cardiovascular: Normal rate and regular rhythm.  Pulmonary/Chest: Effort normal. No respiratory distress. She has no wheezes. She has no rales.  Abdominal: Soft. There is no tenderness.  Musculoskeletal: Normal range of motion.  Lymphadenopathy:    She has no cervical adenopathy.  Neurological: She is alert.  Skin: Skin is warm and dry.  Psychiatric: She has a normal mood and affect. Her behavior is normal.  Nursing note and vitals reviewed.    ED Treatments / Results  Labs (all labs ordered are listed, but only abnormal results are displayed) Labs Reviewed  CBC WITH DIFFERENTIAL/PLATELET - Abnormal; Notable for the following components:      Result Value   WBC 13.8 (*)    RBC 5.17 (*)    Hemoglobin 15.3  (*)    HCT 46.5 (*)    Neutro Abs 9.7 (*)    All other components within normal limits  BASIC METABOLIC PANEL - Abnormal; Notable for the following components:   Glucose, Bld 111 (*)    All other components within normal limits  URINALYSIS, ROUTINE W REFLEX MICROSCOPIC  INFLUENZA PANEL BY PCR (TYPE A & B)  I-STAT BETA HCG BLOOD, ED ([facility], [facility], [facility] ONLY)   Radiology No results found.  Procedures Procedures (including critical care time)  Medications Ordered in ED Medications  ondansetron (ZOFRAN-ODT) disintegrating tablet 4 mg (4 mg  Oral Given 10/01/18 1929)     Initial Impression / Assessment and Plan / ED Course  I have reviewed the triage vital signs and the nursing notes. 22 y.o. female here with flu like symptoms stable for d/c without fever and does not appear toxic. Patient given Rx for Tamiflu and I discussed with her that we can do the flu swab and call her with results and if positive she can fill the Rx for the Tamiflu. Patient to take tylenol and ibuprofen as needed and discussed dehydration and need for increased PO fluids.patient agrees with plan.   Final Clinical Impressions(s) / ED Diagnoses   Final diagnoses:  Nausea  Flu-like symptoms    ED Discharge Orders         Ordered    ondansetron (ZOFRAN ODT) 4 MG disintegrating tablet  Every 8 hours PRN     10/01/18 1954    oseltamivir (TAMIFLU) 75 MG capsule  Every 12 hours     10/01/18 [place], [place], [place] 10/01/18 2130    Hayden Rasmussen, MD 10/02/18 1258

## 2018-10-01 NOTE — ED Triage Notes (Signed)
Pt complains of weakness, nausea, fatigue, headache, lightheadedness since last night. Pt feels like she is starting to get sick.

## 2018-10-01 NOTE — Discharge Instructions (Addendum)
We will call you when the results of your influenza are back. If they are positive you will fill the Rx for the Tamiflu.

## 2018-10-07 ENCOUNTER — Ambulatory Visit: Payer: Medicaid Other | Admitting: Obstetrics and Gynecology

## 2018-10-09 ENCOUNTER — Emergency Department (HOSPITAL_COMMUNITY)
Admission: EM | Admit: 2018-10-09 | Discharge: 2018-10-09 | Disposition: A | Discharge status: Home / Self Care | Payer: Medicaid Other | Attending: Emergency Medicine | Admitting: Emergency Medicine

## 2018-10-09 ENCOUNTER — Encounter (HOSPITAL_COMMUNITY): Payer: Self-pay

## 2018-10-09 ENCOUNTER — Other Ambulatory Visit: Payer: Self-pay

## 2018-10-09 DIAGNOSIS — R42 Dizziness and giddiness: Secondary | ICD-10-CM | POA: Diagnosis present

## 2018-10-09 DIAGNOSIS — F17210 Nicotine dependence, cigarettes, uncomplicated: Secondary | ICD-10-CM | POA: Insufficient documentation

## 2018-10-09 DIAGNOSIS — E869 Volume depletion, unspecified: Secondary | ICD-10-CM | POA: Diagnosis not present

## 2018-10-09 DIAGNOSIS — Z79899 Other long term (current) drug therapy: Secondary | ICD-10-CM | POA: Diagnosis not present

## 2018-10-09 DIAGNOSIS — I10 Essential (primary) hypertension: Secondary | ICD-10-CM | POA: Diagnosis not present

## 2018-10-09 LAB — BASIC METABOLIC PANEL
ANION GAP: 6 (ref 5–15)
BUN: 13 mg/dL (ref 6–20)
CO2: 29 mmol/L (ref 22–32)
Calcium: 9.1 mg/dL (ref 8.9–10.3)
Chloride: 106 mmol/L (ref 98–111)
Creatinine, Ser: 1.01 mg/dL — ABNORMAL HIGH (ref 0.44–1.00)
GFR calc Af Amer: >60 mL/min (ref >60)
GFR calc non Af Amer: >60 mL/min (ref >60)
Glucose, Bld: 90 mg/dL (ref 70–99)
POTASSIUM: 4 mmol/L (ref 3.5–5.1)
SODIUM: 141 mmol/L (ref 135–145)

## 2018-10-09 LAB — I-STAT BETA HCG BLOOD, ED (MC, WL, AP ONLY): I-stat hCG, quantitative: <5 m[IU]/mL (ref <5)

## 2018-10-09 LAB — CBC
HCT: 46.9 % — ABNORMAL HIGH (ref 36.0–46.0)
HEMOGLOBIN: 15.5 g/dL — AB (ref 12.0–15.0)
MCH: 30.5 pg (ref 26.0–34.0)
MCHC: 33 g/dL (ref 30.0–36.0)
MCV: 92.3 fL (ref 80.0–100.0)
NRBC: 0.2 % (ref 0.0–0.2)
Platelets: 257 10*3/uL (ref 150–400)
RBC: 5.08 MIL/uL (ref 3.87–5.11)
RDW: 13 % (ref 11.5–15.5)
WBC: 13.3 10*3/uL — AB (ref 4.0–10.5)

## 2018-10-09 NOTE — ED Notes (Signed)
Called x1 for lab draw- no answer

## 2018-10-09 NOTE — ED Triage Notes (Addendum)
Pt BIBA from home. Pt c/o lightheadedness and dizziness for the last week. Pt states her eating and drinking habits have changed with the start of a new job, and pt is concerned for dehydration. Pt states that she has been having diarrhea and nausea. Pt states she has been having diarrhea multiple times per day. Pt states she generally feels better as the day progresses.

## 2018-10-09 NOTE — ED Provider Notes (Signed)
[facility] DEPT Provider Note   CSN: [id number] Arrival date & time: 10/09/18  1411     History   Chief Complaint Chief Complaint  Patient presents with  . Dizziness    HPI Natasha Long is a 22 y.o. female.  HPI 22 year old female presents the emergency department with several weeks of lightheadedness.  She reports decreased oral intake.  She does not drink water often.  She only eats one meal a day.  No fevers or chills.  No dysuria or urinary frequency.  She reports her urine is concentrated.  She does have heavy menstrual cycles.  No history of blood transfusion.  Denies syncope.  No chest pain.  No palpitations.  Denies nausea vomiting.  Seen last week for similar symptoms.  She reports some nonbloody diarrhea.  Reports still has mild nausea.  Did not fill her prescription for nausea medicine.   Past Medical History:  Diagnosis Date  . Bipolar affective, manic ([place])   . Hypertension    diagnosed as child; stopped meds at 36 yo  . Hypoglycemia   . Insomnia   . Nephrotic syndrome   . Nephrotic syndrome   . PTSD (post-traumatic stress disorder)   . Renal disease     Patient Active Problem List   Diagnosis Date Noted  . 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 Chronic hypertension 04/04/2018  . Nephrotic syndrome 04/01/2018  . Supervision of high risk pregnancy, antepartum 04/01/2018  . Rh negative status during pregnancy 04/01/2018  . PTSD (post-traumatic stress disorder) 04/01/2018  . Bipolar affective disorder, depressed, mild ([place]) 04/09/2017  . Cocaine abuse ([place]) 04/09/2017    Past Surgical History:  Procedure Laterality Date  . CHOLECYSTECTOMY    . extraction of wisdom teeth    . RENAL BIOPSY       OB History    Gravida  2   Para  1   Term  1   Preterm      AB      Living  1     SAB      TAB      Ectopic      Multiple      Live Births  1            Home Medications    Prior  to Admission medications   Medication Sig Start Date End Date Taking? Authorizing Provider  ARIPiprazole (ABILIFY) 10 MG tablet Take 10 mg by mouth daily. 01/26/18   [provider]  benzonatate (TESSALON) 100 MG capsule Take 1 capsule (100 mg total) by mouth every 8 (eight) hours. 10/01/18   Ashley Murrain, NP  doxycycline (VIBRAMYCIN) 100 MG capsule Take 1 capsule (100 mg total) by mouth 2 (two) times daily. One po bid x 7 days 09/13/18   Jacqlyn Larsen, PA-C  Doxylamine-Pyridoxine 10-10 MG TBEC Take 10 mg by mouth 4 (four) times daily. One in the morning, one mid afternoon, two at bedtime Patient not taking: Reported on 04/26/2018 04/01/18   Carrolyn Leigh N, CNM  escitalopram (LEXAPRO) 20 MG tablet Take 20 mg by mouth daily. 12/14/17   [provider]  gabapentin (NEURONTIN) 600 MG tablet Take 1 tablet (600 mg total) by mouth 3 (three) times daily. Patient taking differently: Take 600 mg by mouth 2 (two) times daily.  12/14/17   Clent Demark, PA-C  HYDROcodone-acetaminophen [facility]) 5-325 MG tablet Take 1 tablet by mouth every 6 (six) hours as needed for moderate  pain. 04/26/18   Kirichenko, Lahoma Rocker, PA-C  hydrOXYzine (ATARAX/VISTARIL) 50 MG tablet Take 50 mg by mouth 3 (three) times daily. 12/29/17   [provider]  ibuprofen (ADVIL,MOTRIN) 600 MG tablet Take 1 tablet (600 mg total) by mouth every 6 (six) hours as needed. 04/26/18   Kirichenko, Tatyana, PA-C  mirtazapine (REMERON) 30 MG tablet Take 30 mg by mouth at bedtime. 01/26/18   [provider]  ondansetron (ZOFRAN ODT) 4 MG disintegrating tablet Take 1 tablet (4 mg total) by mouth every 8 (eight) hours as needed for nausea or vomiting. 10/01/18   Ashley Murrain, NP  oseltamivir (TAMIFLU) 75 MG capsule Take 1 capsule (75 mg total) by mouth every 12 (twelve) hours. 10/01/18   Ashley Murrain, NP  Prenatal Vit-Fe Fumarate-FA (PRENATAL PO) Take 1 tablet by mouth daily.    [provider]    Family  History Family History  Problem Relation Age of Onset  . Diabetes Other   . Hypertension Other     Social History Social History   Tobacco Use  . Smoking status: Light Tobacco Smoker    Types: Cigarettes  . Smokeless tobacco: Never Used  Substance Use Topics  . Alcohol use: No  . Drug use: Yes    Types: Marijuana     Allergies   Prednisone; Prozac [fluoxetine hcl]; and Wellbutrin [bupropion]   Review of Systems Review of Systems  All other systems reviewed and are negative.    Physical Exam Updated Vital Signs BP (!) 141/66 (BP Location: Right Arm)   Pulse 65   Temp 98.8 F (37.1 C) (Oral)   Resp 16   Ht 5\' 8"  (1.727 m)   Wt (!) 140.6 kg   LMP 10/06/2018   SpO2 97%   BMI 47.14 kg/m   Physical Exam  Constitutional: She is oriented to person, place, and time. She appears well-developed and well-nourished. No distress.  HENT:  Head: Normocephalic and atraumatic.  Eyes: EOM are normal.  Neck: Normal range of motion.  Cardiovascular: Normal rate, regular rhythm and normal heart sounds.  Pulmonary/Chest: Effort normal and breath sounds normal.  Abdominal: Soft. She exhibits no distension. There is no tenderness.  Musculoskeletal: Normal range of motion.  Neurological: She is alert and oriented to person, place, and time.  Skin: Skin is warm and dry.  Psychiatric: She has a normal mood and affect. Judgment normal.  Nursing note and vitals reviewed.    ED Treatments / Results  Labs (all labs ordered are listed, but only abnormal results are displayed) Labs Reviewed  BASIC METABOLIC PANEL - Abnormal; Notable for the following components:      Result Value   Creatinine, Ser 1.01 (*)    All other components within normal limits  CBC - Abnormal; Notable for the following components:   WBC 13.3 (*)    Hemoglobin 15.5 (*)    HCT 46.9 (*)    All other components within normal limits  URINALYSIS, ROUTINE W REFLEX MICROSCOPIC  I-STAT BETA HCG BLOOD, ED ([facility],  [facility], [facility] ONLY)  CBG MONITORING, ED    EKG EKG Interpretation  Date/Time:  Sunday October 09 2018 14:33:57 EST Ventricular Rate:  63 PR Interval:    QRS Duration: 94 QT Interval:  391 QTC Calculation: 401 R Axis:   -2 Text Interpretation:  Sinus rhythm No old tracing to compare Confirmed by Jola Schmidt [id number]) on 10/09/2018 4:38:28 PM   Radiology No results found.  Procedures Procedures (including critical care time)  Medications Ordered in ED Medications - No data to display   Initial Impression / Assessment and Plan / ED Course  I have reviewed the triage vital signs and the nursing notes.  Pertinent labs & imaging results that were available during my care of the patient were reviewed by me and considered in my medical decision making (see chart for details).    Overall well-appearing.  Suspect dehydration.  Recommended ongoing oral hydration at home.  Labs otherwise unremarkable.  Primary care follow-up.  Encouraged to return the emergency department for new or worsening symptoms.  Final Clinical Impressions(s) / ED Diagnoses   Final diagnoses:  Volume depletion  Lightheaded    ED Discharge Orders    None       Jola Schmidt, MD 10/09/18 1656

## 2018-10-09 NOTE — ED Notes (Signed)
She left after having seen the provider. Therefore, we are unable to perform d/c v.s or give instructions.

## 2018-10-10 ENCOUNTER — Encounter: Payer: Self-pay | Admitting: General Practice

## 2018-10-15 ENCOUNTER — Emergency Department (HOSPITAL_COMMUNITY)
Admission: EM | Admit: 2018-10-15 | Discharge: 2018-10-15 | Disposition: A | Discharge status: Home / Self Care | Payer: Medicaid Other | Attending: Emergency Medicine | Admitting: Emergency Medicine

## 2018-10-15 ENCOUNTER — Encounter (HOSPITAL_COMMUNITY): Payer: Self-pay | Admitting: Emergency Medicine

## 2018-10-15 DIAGNOSIS — Z79899 Other long term (current) drug therapy: Secondary | ICD-10-CM | POA: Insufficient documentation

## 2018-10-15 DIAGNOSIS — Z720 Tobacco use: Secondary | ICD-10-CM | POA: Insufficient documentation

## 2018-10-15 DIAGNOSIS — R110 Nausea: Secondary | ICD-10-CM

## 2018-10-15 DIAGNOSIS — R42 Dizziness and giddiness: Secondary | ICD-10-CM | POA: Insufficient documentation

## 2018-10-15 DIAGNOSIS — I10 Essential (primary) hypertension: Secondary | ICD-10-CM | POA: Diagnosis not present

## 2018-10-15 LAB — CBG MONITORING, ED: GLUCOSE-CAPILLARY: 96 mg/dL (ref 70–99)

## 2018-10-15 MED ORDER — ONDANSETRON HCL 4 MG/2ML IJ SOLN
4.0000 mg | Freq: Once | INTRAMUSCULAR | Status: AC
  Administered 2018-10-15: 4 mg via INTRAVENOUS
  Filled 2018-10-15: qty 2

## 2018-10-15 MED ORDER — SODIUM CHLORIDE 0.9 % IV BOLUS
1000.0000 mL | Freq: Once | INTRAVENOUS | Status: AC
  Administered 2018-10-15: 1000 mL via INTRAVENOUS

## 2018-10-15 MED ORDER — ONDANSETRON HCL 4 MG PO TABS: 4.0000 mg | ORAL_TABLET | Freq: Four times a day (QID) | ORAL | 0 refills | Status: DC

## 2018-10-15 NOTE — ED Provider Notes (Signed)
[place] [facility] DEPT Provider Note   CSN: [id number] Arrival date & time: 10/15/18  1517     History   Chief Complaint Chief Complaint  Patient presents with  . Nausea    HPI Natasha Long is a 22 y.o. female history of bipolar, hypertension, hyperglycemia, nephrotic syndrome, PTSD who presents for evaluation of nausea, lightheadedness that is been ongoing for the last several days.  Patient was seen here in the emergency department on 10/09/2018 for evaluation of same symptoms.  Patient reports that she had fluids, Zofran and lab work done.  She reports feeling better after being here in the ED and went home.  Patient reports she had felt better for little while but then states that symptoms returned.  Patient states that she feels "dehydrated."  She describes this as feeling lightheaded and nauseous.  She denies any room spinning sensation, difficulty walking.  Patient states that she feels like sometimes she may pass out but states she has never lost consciousness.  She does report that she recently started a new job and had difficulty eating and drinking throughout her work.  She states that she tried to increase the amount of fluids but states that a couple days, she forgot to drink.  Patient states she has not had any chest pain or difficulty breathing.  Patient denies any fevers, vision changes, numbness/weakness of arms or legs, abdominal pain, vomiting, dysuria, hematuria.  The history is provided by the patient.    Past Medical History:  Diagnosis Date  . Bipolar affective, manic ([place])   . Hypertension    diagnosed as child; stopped meds at 27 yo  . Hypoglycemia   . Insomnia   . Nephrotic syndrome   . Nephrotic syndrome   . PTSD (post-traumatic stress disorder)   . Renal disease     Patient Active Problem List   Diagnosis Date Noted  . 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 Chronic hypertension 04/04/2018  . Nephrotic  syndrome 04/01/2018  . Supervision of high risk pregnancy, antepartum 04/01/2018  . Rh negative status during pregnancy 04/01/2018  . PTSD (post-traumatic stress disorder) 04/01/2018  . Bipolar affective disorder, depressed, mild ([place]) 04/09/2017  . Cocaine abuse ([place]) 04/09/2017    Past Surgical History:  Procedure Laterality Date  . CHOLECYSTECTOMY    . extraction of wisdom teeth    . RENAL BIOPSY       OB History    Gravida  2   Para  1   Term  1   Preterm      AB      Living  1     SAB      TAB      Ectopic      Multiple      Live Births  1            Home Medications    Prior to Admission medications   Medication Sig Start Date End Date Taking? Authorizing Provider  ARIPiprazole (ABILIFY) 10 MG tablet Take 10 mg by mouth daily. 01/26/18   [provider]  benzonatate (TESSALON) 100 MG capsule Take 1 capsule (100 mg total) by mouth every 8 (eight) hours. 10/01/18   Ashley Murrain, NP  doxycycline (VIBRAMYCIN) 100 MG capsule Take 1 capsule (100 mg total) by mouth 2 (two) times daily. One po bid x 7 days 09/13/18   Jacqlyn Larsen, PA-C  Doxylamine-Pyridoxine 10-10 MG TBEC Take 10 mg by mouth 4 (four)  times daily. One in the morning, one mid afternoon, two at bedtime Patient not taking: Reported on 04/26/2018 04/01/18   Carrolyn Leigh N, CNM  escitalopram (LEXAPRO) 20 MG tablet Take 20 mg by mouth daily. 12/14/17   [provider]  gabapentin (NEURONTIN) 600 MG tablet Take 1 tablet (600 mg total) by mouth 3 (three) times daily. Patient taking differently: Take 600 mg by mouth 2 (two) times daily.  12/14/17   Clent Demark, PA-C  HYDROcodone-acetaminophen [facility]) 5-325 MG tablet Take 1 tablet by mouth every 6 (six) hours as needed for moderate pain. 04/26/18   Kirichenko, Lahoma Rocker, PA-C  hydrOXYzine (ATARAX/VISTARIL) 50 MG tablet Take 50 mg by mouth 3 (three) times daily. 12/29/17   [provider]  ibuprofen (ADVIL,MOTRIN) 600  MG tablet Take 1 tablet (600 mg total) by mouth every 6 (six) hours as needed. 04/26/18   Kirichenko, Tatyana, PA-C  mirtazapine (REMERON) 30 MG tablet Take 30 mg by mouth at bedtime. 01/26/18   [provider]  ondansetron (ZOFRAN ODT) 4 MG disintegrating tablet Take 1 tablet (4 mg total) by mouth every 8 (eight) hours as needed for nausea or vomiting. 10/01/18   Ashley Murrain, NP  ondansetron (ZOFRAN) 4 MG tablet Take 1 tablet (4 mg total) by mouth every 6 (six) hours. 10/15/18   Volanda Napoleon, PA-C  oseltamivir (TAMIFLU) 75 MG capsule Take 1 capsule (75 mg total) by mouth every 12 (twelve) hours. 10/01/18   Ashley Murrain, NP  Prenatal Vit-Fe Fumarate-FA (PRENATAL PO) Take 1 tablet by mouth daily.    [provider]    Family History Family History  Problem Relation Age of Onset  . Diabetes Other   . Hypertension Other     Social History Social History   Tobacco Use  . Smoking status: Light Tobacco Smoker    Types: Cigarettes  . Smokeless tobacco: Never Used  Substance Use Topics  . Alcohol use: No  . Drug use: Yes    Types: Marijuana     Allergies   Prednisone; Prozac [fluoxetine hcl]; and Wellbutrin [bupropion]   Review of Systems Review of Systems  Constitutional: Negative for fever.  Respiratory: Negative for cough and shortness of breath.   Cardiovascular: Negative for chest pain.  Gastrointestinal: Positive for nausea. Negative for abdominal pain and vomiting.  Genitourinary: Negative for dysuria and hematuria.  Neurological: Positive for light-headedness. Negative for weakness, numbness and headaches.  All other systems reviewed and are negative.    Physical Exam Updated Vital Signs BP 124/72 (BP Location: Left Arm)   Pulse 72   Temp 98 F (36.7 C) (Oral)   Resp 16   Ht 5\' 8"  (1.727 m)   Wt 136.1 kg   LMP 10/06/2018   SpO2 94%   BMI 45.61 kg/m   Physical Exam  Constitutional: She is oriented to person, place, and time. She appears  well-developed and well-nourished.  HENT:  Head: Normocephalic and atraumatic.  Mouth/Throat: Oropharynx is clear and moist and mucous membranes are normal.  Eyes: Pupils are equal, round, and reactive to light. Conjunctivae, EOM and lids are normal.  Neck: Full passive range of motion without pain.  Cardiovascular: Normal rate, regular rhythm, normal heart sounds and normal pulses. Exam reveals no gallop and no friction rub.  No murmur heard. Pulmonary/Chest: Effort normal and breath sounds normal.  Lungs clear to auscultation bilaterally.  Symmetric chest rise.  No wheezing, rales, rhonchi.  Abdominal: Soft. Normal appearance. There is no tenderness.  There is no rigidity and no guarding.  Abdomen is soft, non-distended, non-tender. No rigidity, No guarding. No peritoneal signs.  Musculoskeletal: Normal range of motion.  Neurological: She is alert and oriented to person, place, and time.  Cranial nerves III-XII intact Follows commands, Moves all extremities  5/5 strength to BUE and BLE  Sensation intact throughout all major nerve distributions Normal coordination No gait abnormalities  No slurred speech. No facial droop.   Skin: Skin is warm and dry. Capillary refill takes less than 2 seconds.  Psychiatric: She has a normal mood and affect. Her speech is normal.  Nursing note and vitals reviewed.    ED Treatments / Results  Labs (all labs ordered are listed, but only abnormal results are displayed) Labs Reviewed  CBG MONITORING, ED    EKG None  Radiology No results found.  Procedures Procedures (including critical care time)  Medications Ordered in ED Medications  sodium chloride 0.9 % bolus 1,000 mL (0 mLs Intravenous Stopped 10/15/18 1759)  ondansetron (ZOFRAN) injection 4 mg (4 mg Intravenous Given 10/15/18 1630)     Initial Impression / Assessment and Plan / ED Course  I have reviewed the triage vital signs and the nursing notes.  Pertinent labs & imaging  results that were available during my care of the patient were reviewed by me and considered in my medical decision making (see chart for details).     22 year old female who presents for evaluation of nausea, lightheadedness.  She reports she was seen here on 11/10 for same symptoms.  Reports that she felt better initially but then over the last few days has had return of symptoms.  She did report that she has not been eating and drinking as much secondary to a job that she recently started.  No chest pain, difficulty breathing, numbness/weakness of arms or legs, loss of consciousness. Patient is afebrile, non-toxic appearing, sitting comfortably on examination table. Vital signs reviewed and stable.  Abdominal exam is benign.  Neuro deficits noted on exam.  I discussed with patient regarding work-up here in ED.  Offered for lab work for evaluation of her hydration status as well as UA to check for infection and urine pregnancy.  Patient does not wish to have any lab work done today as she recently had it done a few days ago.  I did discuss regarding fluids and antibiotics which patient is agreeable to.  I discussed with patient that without lab work, I cannot tell her the severity of dehydration or whether there is anything else going on.  Patient expresses full understanding of risk first benefits of declining lab work.  She exhibits full medical decision-making capacity.  Reevaluation after fluids and antiemetics.  She is sitting up and resting comfortably.  She reports improvement in symptoms.  She is eating a sandwich without any difficulty.  She has ambulated to bathroom several times without any difficulty.  Patient states she feels better and is ready to go home.  Vital signs are stable. Patient had ample opportunity for questions and discussion. All patient's questions were answered with full understanding. Strict return precautions discussed. Patient expresses understanding and agreement to plan.    Final Clinical Impressions(s) / ED Diagnoses   Final diagnoses:  Nausea  Lightheadedness    ED Discharge Orders         Ordered    ondansetron (ZOFRAN) 4 MG tablet  Every 6 hours     10/15/18 1737  Volanda Napoleon, PA-C 10/16/18 0044    Maudie Flakes, MD 10/16/18 [phone]

## 2018-10-15 NOTE — ED Triage Notes (Signed)
Patient here from home with complaints of nausea. Reports that she was seen for same 11/10. States that she needs a new prescription for Zofran.

## 2018-10-15 NOTE — Discharge Instructions (Signed)
Take Zofran as directed.  As we discussed, make sure drinking plenty of fluids and staying hydrated.  Follow-up with your primary care doctor in the next 3 to 4 days for further evaluation.  Return to emergency department for any worsening symptoms, chest pain, difficulty breathing, difficulty walking, vision changes or any other worsening or concerning symptoms.

## 2018-11-14 ENCOUNTER — Emergency Department (HOSPITAL_COMMUNITY)
Admission: EM | Admit: 2018-11-14 | Discharge: 2018-11-15 | Disposition: A | Discharge status: Rehabilitation Facility | Payer: Medicaid Other | Attending: Emergency Medicine | Admitting: Emergency Medicine

## 2018-11-14 ENCOUNTER — Encounter (HOSPITAL_COMMUNITY): Payer: Self-pay | Admitting: Emergency Medicine

## 2018-11-14 ENCOUNTER — Other Ambulatory Visit: Payer: Self-pay

## 2018-11-14 DIAGNOSIS — F332 Major depressive disorder, recurrent severe without psychotic features: Secondary | ICD-10-CM | POA: Insufficient documentation

## 2018-11-14 DIAGNOSIS — F1220 Cannabis dependence, uncomplicated: Secondary | ICD-10-CM | POA: Insufficient documentation

## 2018-11-14 DIAGNOSIS — F603 Borderline personality disorder: Secondary | ICD-10-CM | POA: Insufficient documentation

## 2018-11-14 DIAGNOSIS — Z008 Encounter for other general examination: Secondary | ICD-10-CM | POA: Insufficient documentation

## 2018-11-14 DIAGNOSIS — Z79899 Other long term (current) drug therapy: Secondary | ICD-10-CM | POA: Insufficient documentation

## 2018-11-14 DIAGNOSIS — F1910 Other psychoactive substance abuse, uncomplicated: Secondary | ICD-10-CM | POA: Insufficient documentation

## 2018-11-14 DIAGNOSIS — F17210 Nicotine dependence, cigarettes, uncomplicated: Secondary | ICD-10-CM | POA: Insufficient documentation

## 2018-11-14 DIAGNOSIS — I10 Essential (primary) hypertension: Secondary | ICD-10-CM | POA: Insufficient documentation

## 2018-11-14 LAB — COMPREHENSIVE METABOLIC PANEL
ALT: 22 U/L (ref 0–44)
AST: 21 U/L (ref 15–41)
Albumin: 3.6 g/dL (ref 3.5–5.0)
Alkaline Phosphatase: 48 U/L (ref 38–126)
Anion gap: 5 (ref 5–15)
BILIRUBIN TOTAL: 0.3 mg/dL (ref 0.3–1.2)
BUN: 13 mg/dL (ref 6–20)
CALCIUM: 9.4 mg/dL (ref 8.9–10.3)
CO2: 26 mmol/L (ref 22–32)
CREATININE: 0.95 mg/dL (ref 0.44–1.00)
Chloride: 109 mmol/L (ref 98–111)
GFR calc Af Amer: >60 mL/min (ref >60)
Glucose, Bld: 116 mg/dL — ABNORMAL HIGH (ref 70–99)
Potassium: 3.7 mmol/L (ref 3.5–5.1)
Sodium: 140 mmol/L (ref 135–145)
TOTAL PROTEIN: 6.9 g/dL (ref 6.5–8.1)

## 2018-11-14 LAB — I-STAT BETA HCG BLOOD, ED (MC, WL, AP ONLY): I-stat hCG, quantitative: <5 m[IU]/mL (ref <5)

## 2018-11-14 LAB — RAPID URINE DRUG SCREEN, HOSP PERFORMED
Amphetamines: NOT DETECTED
Barbiturates: NOT DETECTED
Benzodiazepines: NOT DETECTED
Cocaine: POSITIVE — AB
OPIATES: NOT DETECTED
TETRAHYDROCANNABINOL: POSITIVE — AB

## 2018-11-14 LAB — CBC
HCT: 47.1 % — ABNORMAL HIGH (ref 36.0–46.0)
Hemoglobin: 15.6 g/dL — ABNORMAL HIGH (ref 12.0–15.0)
MCH: 30.5 pg (ref 26.0–34.0)
MCHC: 33.1 g/dL (ref 30.0–36.0)
MCV: 92.2 fL (ref 80.0–100.0)
PLATELETS: 297 10*3/uL (ref 150–400)
RBC: 5.11 MIL/uL (ref 3.87–5.11)
RDW: 13.2 % (ref 11.5–15.5)
WBC: 14.6 10*3/uL — ABNORMAL HIGH (ref 4.0–10.5)
nRBC: 0 % (ref 0.0–0.2)

## 2018-11-14 LAB — ETHANOL

## 2018-11-14 MED ORDER — NICOTINE 21 MG/24HR TD PT24
21.0000 mg | MEDICATED_PATCH | TRANSDERMAL | Status: DC
  Administered 2018-11-14: 21 mg via TRANSDERMAL
  Filled 2018-11-14: qty 1

## 2018-11-14 NOTE — ED Provider Notes (Signed)
[facility] DEPT Provider Note   CSN: [id number] Arrival date & time: 11/14/18  1609     History   Chief Complaint Chief Complaint  Patient presents with  . Medical Clearance  . detox from cocaine  . Headache    HPI Natasha Long is a 22 y.o. female.  HPI Patient presents for medical clearance.  States she wants to go to [facility] in [place].  States she needs to cough cocaine and marijuana.  Last use last night.  Will use a gram or 2 a day.  Denies suicidal thoughts.  States she is been treated for this previously.  Has a dull headache which is not unusual for her.  No hallucinations.  States that she called [facility] and they said she just needs a referral. Past Medical History:  Diagnosis Date  . Bipolar affective, manic ([place])   . Hypertension    diagnosed as child; stopped meds at 58 yo  . Hypoglycemia   . Insomnia   . Nephrotic syndrome   . Nephrotic syndrome   . PTSD (post-traumatic stress disorder)   . Renal disease     Patient Active Problem List   Diagnosis Date Noted  . 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 Chronic hypertension 04/04/2018  . Nephrotic syndrome 04/01/2018  . Supervision of high risk pregnancy, antepartum 04/01/2018  . Rh negative status during pregnancy 04/01/2018  . PTSD (post-traumatic stress disorder) 04/01/2018  . Bipolar affective disorder, depressed, mild ([place]) 04/09/2017  . Cocaine abuse ([place]) 04/09/2017    Past Surgical History:  Procedure Laterality Date  . CHOLECYSTECTOMY    . extraction of wisdom teeth    . RENAL BIOPSY       OB History    Gravida  2   Para  1   Term  1   Preterm      AB      Living  1     SAB      TAB      Ectopic      Multiple      Live Births  1            Home Medications    Prior to Admission medications   Medication Sig Start Date End Date Taking? Authorizing Provider  ARIPiprazole (ABILIFY) 10 MG tablet  Take 10 mg by mouth daily. 01/26/18  Yes [provider]  gabapentin (NEURONTIN) 600 MG tablet Take 1 tablet (600 mg total) by mouth 3 (three) times daily. 12/14/17  Yes Clent Demark, PA-C  hydrOXYzine (ATARAX/VISTARIL) 50 MG tablet Take 50 mg by mouth 3 (three) times daily. 12/29/17  Yes [provider]  traZODone (DESYREL) 100 MG tablet Take 100 mg by mouth at bedtime as needed for sleep.   Yes [provider]  benzonatate (TESSALON) 100 MG capsule Take 1 capsule (100 mg total) by mouth every 8 (eight) hours. Patient not taking: Reported on 11/14/2018 10/01/18   Ashley Murrain, NP  doxycycline (VIBRAMYCIN) 100 MG capsule Take 1 capsule (100 mg total) by mouth 2 (two) times daily. One po bid x 7 days Patient not taking: Reported on 11/14/2018 09/13/18   Jacqlyn Larsen, PA-C  Doxylamine-Pyridoxine 10-10 MG TBEC Take 10 mg by mouth 4 (four) times daily. One in the morning, one mid afternoon, two at bedtime Patient not taking: Reported on 04/26/2018 04/01/18   Carrolyn Leigh N, CNM  escitalopram (LEXAPRO) 20 MG tablet Take 20 mg by mouth  daily. 12/14/17   [provider]  HYDROcodone-acetaminophen (NORCO) 5-325 MG tablet Take 1 tablet by mouth every 6 (six) hours as needed for moderate pain. Patient not taking: Reported on 11/14/2018 04/26/18   Jeannett Senior, PA-C  ibuprofen (ADVIL,MOTRIN) 600 MG tablet Take 1 tablet (600 mg total) by mouth every 6 (six) hours as needed. Patient not taking: Reported on 11/14/2018 04/26/18   Jeannett Senior, PA-C  ondansetron (ZOFRAN ODT) 4 MG disintegrating tablet Take 1 tablet (4 mg total) by mouth every 8 (eight) hours as needed for nausea or vomiting. Patient not taking: Reported on 11/14/2018 10/01/18   Ashley Murrain, NP  ondansetron (ZOFRAN) 4 MG tablet Take 1 tablet (4 mg total) by mouth every 6 (six) hours. Patient not taking: Reported on 11/14/2018 10/15/18   Volanda Napoleon, PA-C  oseltamivir (TAMIFLU) 75 MG  capsule Take 1 capsule (75 mg total) by mouth every 12 (twelve) hours. Patient not taking: Reported on 11/14/2018 10/01/18   Ashley Murrain, NP    Family History Family History  Problem Relation Age of Onset  . Diabetes Other   . Hypertension Other     Social History Social History   Tobacco Use  . Smoking status: Light Tobacco Smoker    Types: Cigarettes  . Smokeless tobacco: Never Used  Substance Use Topics  . Alcohol use: No  . Drug use: Yes    Types: Marijuana, Cocaine     Allergies   Prednisone; Prozac [fluoxetine hcl]; and Wellbutrin [bupropion]   Review of Systems Review of Systems  Constitutional: Negative for appetite change.  HENT: Negative for congestion.   Respiratory: Negative for shortness of breath.   Cardiovascular: Negative for chest pain.  Gastrointestinal: Negative for abdominal distention.  Genitourinary: Negative for flank pain.  Musculoskeletal: Negative for back pain.  Skin: Negative for pallor.  Neurological: Positive for headaches. Negative for dizziness and weakness.  Psychiatric/Behavioral: Negative for confusion and suicidal ideas.     Physical Exam Updated Vital Signs BP 136/85 (BP Location: Right Arm)   Pulse 76   Temp 98.6 F (37 C) (Oral)   Resp 18   Ht 5\' 8"  (1.727 m)   Wt (!) 140.6 kg   LMP 11/07/2018   SpO2 100%   BMI 47.14 kg/m   Physical Exam HENT:     Head: Atraumatic.  Eyes:     Extraocular Movements: Extraocular movements intact.     Pupils: Pupils are equal, round, and reactive to light.  Neck:     Musculoskeletal: Neck supple.  Cardiovascular:     Rate and Rhythm: Normal rate.  Pulmonary:     Effort: Pulmonary effort is normal.  Abdominal:     General: Bowel sounds are normal.  Skin:    General: Skin is warm.     Capillary Refill: Capillary refill takes less than 2 seconds.  Neurological:     Mental Status: She is alert.     Cranial Nerves: No dysarthria.  Psychiatric:        Mood and Affect: Mood  normal.      ED Treatments / Results  Labs (all labs ordered are listed, but only abnormal results are displayed) Labs Reviewed  COMPREHENSIVE METABOLIC PANEL - Abnormal; Notable for the following components:      Result Value   Glucose, Bld 116 (*)    All other components within normal limits  CBC - Abnormal; Notable for the following components:   WBC 14.6 (*)    Hemoglobin 15.6 (*)  HCT 47.1 (*)    All other components within normal limits  RAPID URINE DRUG SCREEN, HOSP PERFORMED - Abnormal; Notable for the following components:   Cocaine POSITIVE (*)    Tetrahydrocannabinol POSITIVE (*)    All other components within normal limits  ETHANOL  I-STAT BETA HCG BLOOD, ED ([facility], [facility], [facility] ONLY)    EKG EKG Interpretation  Date/Time:  Monday November 14 2018 16:20:15 EST Ventricular Rate:  87 PR Interval:    QRS Duration: 83 QT Interval:  358 QTC Calculation: 431 R Axis:   63 Text Interpretation:  Sinus rhythm Baseline wander in lead(s) V1 Confirmed by Davonna Belling [id number]) on 11/14/2018 8:34:29 PM   Radiology No results found.  Procedures Procedures (including critical care time)  Medications Ordered in ED Medications  nicotine (NICODERM CQ - dosed in mg/24 hours) patch 21 mg (21 mg Transdermal Patch Applied 11/14/18 2242)     Initial Impression / Assessment and Plan / ED Course  I have reviewed the triage vital signs and the nursing notes.  Pertinent labs & imaging results that were available during my care of the patient were reviewed by me and considered in my medical decision making (see chart for details).     Patient presented for medical clearance.  Cocaine and marijuana use.  Labs reassuring.  Medically cleared for psychiatric treatment.  Wants to go to a place in [place].  They were faxed medical clearance forms.  Not suicidal and is cleared for discharge.  Final Clinical Impressions(s) / ED Diagnoses   Final diagnoses:  Polysubstance abuse [facility])      ED Discharge Orders    None       Davonna Belling, MD 11/15/18 0001

## 2018-11-14 NOTE — ED Triage Notes (Addendum)
Per [facility] pt from home for medical clearance to get into rehab for detox from cocaine, THC and other drugs. Last use cocaine was last night and THC today. Pt c/o headache.  CBG 115  Pt reports that she is needing medical clearance to get into [facility] in [place].  Denies SI or HI.

## 2018-11-14 NOTE — ED Notes (Addendum)
Pt has blue suitcase, polka dot bag, teddy bear, and brown coat that have been placed in secure unit dayroom. Two bags of belongings in locker 29 while in [place].

## 2018-11-14 NOTE — ED Notes (Signed)
Patient cleared to go to [facility]. [facility] and asked if she had a bed. They requested me to fax MD notes, facesheet, labs, and vital signs. Faxed information to [facility]. Will call shortly to see if they received information.

## 2018-11-14 NOTE — Discharge Instructions (Signed)
[facility] is medically cleared for her treatment of substance abuse.

## 2018-11-15 ENCOUNTER — Inpatient Hospital Stay (HOSPITAL_COMMUNITY)
Admission: AD | Admit: 2018-11-15 | Discharge: 2018-11-18 | DRG: 897 | Disposition: A | Discharge status: Home / Self Care | Payer: Medicaid Other | Source: Intra-hospital | Attending: Psychiatry | Admitting: Psychiatry

## 2018-11-15 ENCOUNTER — Encounter (HOSPITAL_COMMUNITY): Payer: Self-pay

## 2018-11-15 DIAGNOSIS — F1420 Cocaine dependence, uncomplicated: Secondary | ICD-10-CM | POA: Diagnosis present

## 2018-11-15 DIAGNOSIS — F603 Borderline personality disorder: Secondary | ICD-10-CM | POA: Diagnosis present

## 2018-11-15 DIAGNOSIS — F4310 Post-traumatic stress disorder, unspecified: Secondary | ICD-10-CM | POA: Diagnosis present

## 2018-11-15 DIAGNOSIS — Z79899 Other long term (current) drug therapy: Secondary | ICD-10-CM | POA: Diagnosis not present

## 2018-11-15 DIAGNOSIS — Z9049 Acquired absence of other specified parts of digestive tract: Secondary | ICD-10-CM

## 2018-11-15 DIAGNOSIS — Z833 Family history of diabetes mellitus: Secondary | ICD-10-CM | POA: Diagnosis not present

## 2018-11-15 DIAGNOSIS — Z888 Allergy status to other drugs, medicaments and biological substances status: Secondary | ICD-10-CM | POA: Diagnosis not present

## 2018-11-15 DIAGNOSIS — F332 Major depressive disorder, recurrent severe without psychotic features: Secondary | ICD-10-CM | POA: Diagnosis present

## 2018-11-15 DIAGNOSIS — F1220 Cannabis dependence, uncomplicated: Secondary | ICD-10-CM

## 2018-11-15 DIAGNOSIS — Z8249 Family history of ischemic heart disease and other diseases of the circulatory system: Secondary | ICD-10-CM

## 2018-11-15 DIAGNOSIS — F1410 Cocaine abuse, uncomplicated: Secondary | ICD-10-CM | POA: Diagnosis present

## 2018-11-15 DIAGNOSIS — Z9114 Patient's other noncompliance with medication regimen: Secondary | ICD-10-CM

## 2018-11-15 DIAGNOSIS — G4700 Insomnia, unspecified: Secondary | ICD-10-CM | POA: Diagnosis present

## 2018-11-15 DIAGNOSIS — Z87441 Personal history of nephrotic syndrome: Secondary | ICD-10-CM

## 2018-11-15 DIAGNOSIS — F17210 Nicotine dependence, cigarettes, uncomplicated: Secondary | ICD-10-CM | POA: Diagnosis present

## 2018-11-15 MED ORDER — ALUM & MAG HYDROXIDE-SIMETH 200-200-20 MG/5ML PO SUSP: 30.0000 mL | ORAL | Status: DC | PRN

## 2018-11-15 MED ORDER — TRAZODONE HCL 100 MG PO TABS
100.0000 mg | ORAL_TABLET | Freq: Every evening | ORAL | Status: DC | PRN
  Administered 2018-11-15 – 2018-11-17 (×3): 100 mg via ORAL
  Filled 2018-11-15 (×3): qty 1

## 2018-11-15 MED ORDER — NICOTINE 21 MG/24HR TD PT24
21.0000 mg | MEDICATED_PATCH | Freq: Every day | TRANSDERMAL | Status: DC
  Administered 2018-11-15 – 2018-11-18 (×4): 21 mg via TRANSDERMAL
  Filled 2018-11-15 (×7): qty 1

## 2018-11-15 MED ORDER — HYDROXYZINE HCL 50 MG PO TABS
50.0000 mg | ORAL_TABLET | Freq: Three times a day (TID) | ORAL | Status: DC | PRN
  Administered 2018-11-15 – 2018-11-16 (×3): 50 mg via ORAL
  Filled 2018-11-15 (×3): qty 1

## 2018-11-15 MED ORDER — ARIPIPRAZOLE 10 MG PO TABS
10.0000 mg | ORAL_TABLET | Freq: Every day | ORAL | Status: DC
  Administered 2018-11-15 – 2018-11-16 (×2): 10 mg via ORAL
  Filled 2018-11-15 (×4): qty 1

## 2018-11-15 MED ORDER — HYDROXYZINE HCL 25 MG PO TABS: 25.0000 mg | ORAL_TABLET | Freq: Three times a day (TID) | ORAL | Status: DC | PRN

## 2018-11-15 MED ORDER — ACETAMINOPHEN 325 MG PO TABS
650.0000 mg | ORAL_TABLET | Freq: Four times a day (QID) | ORAL | Status: DC | PRN
  Administered 2018-11-17 – 2018-11-18 (×2): 650 mg via ORAL
  Filled 2018-11-15 (×2): qty 2

## 2018-11-15 MED ORDER — MAGNESIUM HYDROXIDE 400 MG/5ML PO SUSP
30.0000 mL | Freq: Every day | ORAL | Status: DC | PRN
  Administered 2018-11-17: 30 mL via ORAL
  Filled 2018-11-15: qty 30

## 2018-11-15 MED ORDER — HYDROXYZINE HCL 50 MG PO TABS: 50.0000 mg | ORAL_TABLET | Freq: Three times a day (TID) | ORAL | Status: DC

## 2018-11-15 MED ORDER — GABAPENTIN 600 MG PO TABS
600.0000 mg | ORAL_TABLET | Freq: Three times a day (TID) | ORAL | Status: DC
  Administered 2018-11-15 – 2018-11-16 (×4): 600 mg via ORAL
  Filled 2018-11-15 (×8): qty 1

## 2018-11-15 MED ORDER — TRAZODONE HCL 50 MG PO TABS: 50.0000 mg | ORAL_TABLET | Freq: Every evening | ORAL | Status: DC | PRN

## 2018-11-15 NOTE — ED Notes (Signed)
[facility] here to pick up patient and transport to [facility]. Patient stable for transport.
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
## 2018-11-15 NOTE — BHH Suicide Risk Assessment (Signed)
[facility] Admission Suicide Risk Assessment   Nursing information obtained from:  Patient Demographic factors:  NA Current Mental Status:  Self-harm thoughts Loss Factors:  Financial problems / change in socioeconomic status Historical Factors:  Prior suicide attempts Risk Reduction Factors:  Living with another person, especially a relative  Total Time spent with patient: 30 minutes Principal Problem: <principal problem not specified> Diagnosis:  Active Problems:   Severe recurrent major depression without psychotic features ([place])  Subjective Data: Patient is seen and examined.  Patient is a 22 year old female with a past psychiatric history significant for cocaine dependence, marijuana dependence, borderline personality disorder, who presented with chief complaint that she wanted to get clean.  The patient stated that she went to the emergency department at [facility] on 11/14/2018.  She was looking for medical clearance to go to [facility] in [place].  She had last been hospitalized there in March or April 2018.  She stated that she had relapsed on substances heavily over the last month or so.  She stated she had tried to quit before, but was unable to.  She had been prescribed Abilify and gabapentin at [facility].  She stated that over the last several days she had not been compliant with her medications.  She stated that she recognizes that cocaine and marijuana are both a problem, but she has a hard time stopping marijuana.  She stated she has had at least a months worth of sobriety over the last 6 months.  She stated that she has been admitted to the psychiatric hospital over 20 times since she was an adolescent.  She also has been previously diagnosed with PTSD and/or bipolar disorder.  She was admitted to the hospital for evaluation and stabilization.  Continued Clinical Symptoms:  Alcohol Use Disorder Identification Test Final Score (AUDIT): 1 The "Alcohol Use Disorders Identification  Test", Guidelines for Use in Primary Care, Second Edition.  World Pharmacologist [facility]). Score between 0-7:  no or low risk or alcohol related problems. Score between 8-15:  moderate risk of alcohol related problems. Score between 16-19:  high risk of alcohol related problems. Score 20 or above:  warrants further diagnostic evaluation for alcohol dependence and treatment.   CLINICAL FACTORS:   Bipolar Disorder:   Mixed State Depression:   Anhedonia Comorbid alcohol abuse/dependence Hopelessness Impulsivity Insomnia Alcohol/Substance Abuse/Dependencies Personality Disorders:   Cluster B More than one psychiatric diagnosis Previous Psychiatric Diagnoses and Treatments   Musculoskeletal: Strength & Muscle Tone: within normal limits Gait & Station: normal Patient leans: N/A  Psychiatric Specialty Exam: Physical Exam  Nursing note and vitals reviewed. Constitutional: She is oriented to person, place, and time. She appears well-developed and well-nourished.  HENT:  Head: Normocephalic and atraumatic.  Respiratory: Effort normal.  Neurological: She is alert and oriented to person, place, and time.    ROS  Blood pressure (!) 144/72, pulse 91, temperature 98.9 F (37.2 C), temperature source Oral, resp. rate 18, height 5\' 8"  (1.727 m), weight (!) 140.6 kg, last menstrual period 11/07/2018, unknown if currently breastfeeding.Body mass index is 47.14 kg/m.  General Appearance: Disheveled  Eye Contact:  Fair  Speech:  Normal Rate  Volume:  Normal  Mood:  Dysphoric  Affect:  Congruent  Thought Process:  Coherent and Descriptions of Associations: Intact  Orientation:  Full (Time, Place, and Person)  Thought Content:  Logical  Suicidal Thoughts:  Yes.  without intent/plan  Homicidal Thoughts:  No  Memory:  Immediate;   Fair Recent;  Fair Remote;   Fair  Judgement:  Impaired  Insight:  Fair  Psychomotor Activity:  Decreased and Psychomotor Retardation  Concentration:   Concentration: Fair and Attention Span: Fair  Recall:  [employer] of Knowledge:  Fair  Language:  Good  Akathisia:  Negative  Handed:  Right  AIMS (if indicated):     Assets:  Communication Skills Desire for Improvement Financial Resources/Insurance Housing Leisure Time Physical Health Resilience Social Support  ADL's:  Intact  Cognition:  WNL  Sleep:  Number of Hours: 1.25      COGNITIVE FEATURES THAT CONTRIBUTE TO RISK:  None    SUICIDE RISK:   Minimal: No identifiable suicidal ideation.  Patients presenting with no risk factors but with morbid ruminations; may be classified as minimal risk based on the severity of the depressive symptoms  PLAN OF CARE: Patient is seen and examined.  Patient is a 22 year old female with the above-stated past psychiatric history who was admitted secondary to substances as well as suicidal ideation.  She currently denies suicidal ideation.  She will be admitted to the hospital.  She will be integrated into the milieu.  She will be restarted on her Abilify and gabapentin.  She will be monitored for withdrawal symptoms.  Her blood pressure is mildly elevated today, and she stated that she had previously had blood pressure problems when she was an adolescent.  Her drug screen was positive for cocaine and marijuana.  Her beta hCG was negative.  Liver function enzymes are normal.  Her white blood cell count was mildly elevated at 14.6.  Her hemoglobin and hematocrit were both elevated at 15.6 and 47.1.  Currently she stated she is called her work and she has a week off to try and get better, but right now did not want to seek a residential treatment program.  I certify that inpatient services furnished can reasonably be expected to improve the patient's condition.   Sharma Covert, MD 11/15/2018, 8:24 AM

## 2018-11-15 NOTE — BHH Group Notes (Signed)
[facility] Group Therapy 11/15/2018 1:15pm  Type of Therapy: Mental Health Association Presentation  Participation Level: Active  Participation Quality: Attentive  Affect: Appropriate  Cognitive: Oriented  Insight: Developing/Improving  Engagement in Therapy: Engaged  Modes of Intervention: Discussion, Education and Socialization  Summary of Progress/Problems: [place] ([place] Speaker came to talk about his personal journey with mental health. The pt processed ways by which to relate to the speaker. [place] speaker provided handouts and educational information pertaining to groups and services offered by the [facility]. Pt was engaged in speaker's presentation and was receptive to resources provided.    Avelina Laine, LCSW 11/15/2018 2:20 PM
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
## 2018-11-15 NOTE — H&P (Signed)
Psychiatric Admission Assessment Adult  Patient Identification: Natasha Long MRN:  [id number] Date of Evaluation:  11/15/2018 Chief Complaint:  MDD recurrent severe Borderline Personality THC use Disorder Cocaine use disorder Principal Diagnosis: <principal problem not specified> Diagnosis:  Active Problems:   Severe recurrent major depression without psychotic features ([employer])  History of Present Illness: Patient is seen and examined.  Patient is a 22 year old female with a past psychiatric history significant for cocaine dependence, marijuana dependence, borderline personality disorder, who presented with chief complaint that she wanted to get clean.  The patient stated that she went to the emergency department at [facility] on 11/14/2018.  She was looking for medical clearance to go to [facility] in [employer].  She had last been hospitalized there in March or April 2018.  She stated that she had relapsed on substances heavily over the last month or so.  She stated she had tried to quit before, but was unable to.  She had been prescribed Abilify and gabapentin at [facility].  She stated that over the last several days she had not been compliant with her medications.  She stated that she recognizes that cocaine and marijuana are both a problem, but she has a hard time stopping marijuana.  She stated she has had at least a months worth of sobriety over the last 6 months.  She stated that she has been admitted to the psychiatric hospital over 20 times since she was an adolescent.  She also has been previously diagnosed with PTSD and/or bipolar disorder.  She was admitted to the hospital for evaluation and stabilization.  Associated Signs/Symptoms: Depression Symptoms:  depressed mood, anhedonia, insomnia, psychomotor agitation, fatigue, feelings of worthlessness/guilt, difficulty concentrating, hopelessness, suicidal thoughts without plan, anxiety, loss of energy/fatigue, disturbed  sleep, (Hypo) Manic Symptoms:  Impulsivity, Labiality of Mood, Anxiety Symptoms:  Excessive Worry, Psychotic Symptoms:  Denied PTSD Symptoms: Had a traumatic exposure:  Patient was in multiple group homes, foster homes and in residential treatment as an adolescent. Total Time spent with patient: 30 minutes  Past Psychiatric History: Patient stated that she had been admitted to the hospital more than 20 times in her lifetime.  She has been in foster homes, group homes and residential programs.  Her last psychiatric admission was at [facility] in 2018 for cocaine and marijuana issues.  She has previously been diagnosed with several disorders but stated that she is come to grips with the fact that she has borderline personality disorder.  She is also been diagnosed previously with posttraumatic stress disorder as well as bipolar disorder.  Is the patient at risk to self? Yes.    Has the patient been a risk to self in the past 6 months? Yes.    Has the patient been a risk to self within the distant past? Yes.    Is the patient a risk to others? No.  Has the patient been a risk to others in the past 6 months? No.  Has the patient been a risk to others within the distant past? No.   Prior Inpatient Therapy:   Prior Outpatient Therapy:    Alcohol Screening: 1. How often do you have a drink containing alcohol?: Monthly or less 2. How many drinks containing alcohol do you have on a typical day when you are drinking?: 1 or 2 3. How often do you have six or more drinks on one occasion?: Never AUDIT-C Score: 1 9. Have you or someone else been injured as a result of your  drinking?: No 10. Has a relative or friend or a doctor or another health worker been concerned about your drinking or suggested you cut down?: No Alcohol Use Disorder Identification Test Final Score (AUDIT): 1 Intervention/Follow-up: AUDIT Score <7 follow-up not indicated Substance Abuse History in the last 12 months:  Yes.    Consequences of Substance Abuse: Negative Previous Psychotropic Medications: Yes  Psychological Evaluations: Yes  Past Medical History:  Past Medical History:  Diagnosis Date  . Bipolar affective, manic ([place])   . Hypertension    diagnosed as child; stopped meds at 79 yo  . Hypoglycemia   . Insomnia   . Nephrotic syndrome   . Nephrotic syndrome   . PTSD (post-traumatic stress disorder)   . Renal disease     Past Surgical History:  Procedure Laterality Date  . CHOLECYSTECTOMY    . extraction of wisdom teeth    . RENAL BIOPSY     Family History:  Family History  Problem Relation Age of Onset  . Diabetes Other   . Hypertension Other    Family Psychiatric  History: She denied any family history of any psychiatric illness. Tobacco Screening: Have you used any form of tobacco in the last 30 days? (Cigarettes, Smokeless Tobacco, Cigars, and/or Pipes): Yes Tobacco use, Select all that apply: 5 or more cigarettes per day Are you interested in Tobacco Cessation Medications?: Yes, will notify MD for an order Counseled patient on smoking cessation including recognizing danger situations, developing coping skills and basic information about quitting provided: Refused/Declined practical counseling Social History:  Social History   Substance and Sexual Activity  Alcohol Use No     Social History   Substance and Sexual Activity  Drug Use Yes  . Types: Marijuana, Cocaine    Additional Social History:                           Allergies:   Allergies  Allergen Reactions  . Prednisone Other (See Comments)    Pt states that this med caused pancreatitis.   . Prozac [Fluoxetine Hcl] Other (See Comments)    Panic attack   . Wellbutrin [Bupropion] Other (See Comments)    Panic attack   Lab Results:  Results for orders placed or performed during the hospital encounter of 11/14/18 (from the past 48 hour(s))  Comprehensive metabolic panel     Status: Abnormal    Collection Time: 11/14/18  4:45 PM  Result Value Ref Range   Sodium 140 135 - 145 mmol/L   Potassium 3.7 3.5 - 5.1 mmol/L   Chloride 109 98 - 111 mmol/L   CO2 26 22 - 32 mmol/L   Glucose, Bld 116 (H) 70 - 99 mg/dL   BUN 13 6 - 20 mg/dL   Creatinine, Ser 0.95 0.44 - 1.00 mg/dL   Calcium 9.4 8.9 - 10.3 mg/dL   Total Protein 6.9 6.5 - 8.1 g/dL   Albumin 3.6 3.5 - 5.0 g/dL   AST 21 15 - 41 U/L   ALT 22 0 - 44 U/L   Alkaline Phosphatase 48 38 - 126 U/L   Total Bilirubin 0.3 0.3 - 1.2 mg/dL   GFR calc non Af Amer >60 >60 mL/min   GFR calc Af Amer >60 >60 mL/min   Anion gap 5 5 - 15    Comment: Performed at [facility], [place] [address], [place], [place] [address]  cbc     Status: Abnormal  Collection Time: 11/14/18  4:45 PM  Result Value Ref Range   WBC 14.6 (H) 4.0 - 10.5 K/uL   RBC 5.11 3.87 - 5.11 MIL/uL   Hemoglobin 15.6 (H) 12.0 - 15.0 g/dL   HCT 47.1 (H) 36.0 - 46.0 %   MCV 92.2 80.0 - 100.0 fL   MCH 30.5 26.0 - 34.0 pg   MCHC 33.1 30.0 - 36.0 g/dL   RDW 13.2 11.5 - 15.5 %   Platelets 297 150 - 400 K/uL   nRBC 0.0 0.0 - 0.2 %    Comment: Performed at [facility], [place] [address] [place], [place] [address]  Ethanol     Status: None   Collection Time: 11/14/18  4:46 PM  Result Value Ref Range   Alcohol, Ethyl (B) <10 <10 mg/dL    Comment: (NOTE) Lowest detectable limit for serum alcohol is 10 mg/dL. For medical purposes only. Performed at [facility], [place] [address] [place], [place] [address]   Rapid urine drug screen (hospital performed)     Status: Abnormal   Collection Time: 11/14/18  4:46 PM  Result Value Ref Range   Opiates NONE DETECTED NONE DETECTED   Cocaine POSITIVE (A) NONE DETECTED   Benzodiazepines NONE DETECTED NONE DETECTED   Amphetamines NONE DETECTED NONE DETECTED   Tetrahydrocannabinol POSITIVE (A) NONE DETECTED   Barbiturates NONE DETECTED NONE DETECTED    Comment: (NOTE) DRUG SCREEN FOR  MEDICAL PURPOSES ONLY.  IF CONFIRMATION IS NEEDED FOR ANY PURPOSE, NOTIFY LAB WITHIN 5 DAYS. LOWEST DETECTABLE LIMITS FOR URINE DRUG SCREEN Drug Class                     Cutoff (ng/mL) Amphetamine and metabolites    1000 Barbiturate and metabolites    200 Benzodiazepine                 585 Tricyclics and metabolites     300 Opiates and metabolites        300 Cocaine and metabolites        300 THC                            50 Performed at [facility], [place] [address], [place], [place] [address]   I-Stat beta hCG blood, ED     Status: None   Collection Time: 11/14/18  4:49 PM  Result Value Ref Range   I-stat hCG, quantitative <5.0 <5 mIU/mL   Comment 3            Comment:   GEST. AGE      CONC.  (mIU/mL)   <=1 WEEK        5 - 50     2 WEEKS       50 - 500     3 WEEKS       100 - 10,000     4 WEEKS     1,000 - 30,000        FEMALE AND NON-PREGNANT FEMALE:     LESS THAN 5 mIU/mL     Blood Alcohol level:  Lab Results  Component Value Date   ETH <10 11/14/2018   ETH <5 42/35/3614    Metabolic Disorder Labs:  No results found for: HGBA1C, MPG No results found for: PROLACTIN Lab Results  Component Value Date   CHOL 169 09/15/2017   TRIG 196 (H) 09/15/2017   HDL  53 09/15/2017   CHOLHDL 3.2 09/15/2017   LDLCALC 77 09/15/2017    Current Medications: Current Facility-Administered Medications  Medication Dose Route Frequency Provider Last Rate Last Dose  . acetaminophen (TYLENOL) tablet 650 mg  650 mg Oral Q6H PRN Lindon Romp A, NP      . alum & mag hydroxide-simeth (MAALOX/MYLANTA) 200-200-20 MG/5ML suspension 30 mL  30 mL Oral Q4H PRN Lindon Romp A, NP      . ARIPiprazole (ABILIFY) tablet 10 mg  10 mg Oral Daily Lindon Romp A, NP   10 mg at 11/15/18 0816  . gabapentin (NEURONTIN) tablet 600 mg  600 mg Oral TID Lindon Romp A, NP   600 mg at 11/15/18 0816  . hydrOXYzine (ATARAX/VISTARIL) tablet 50 mg  50 mg Oral TID PRN Rozetta Nunnery, NP      .  magnesium hydroxide (MILK OF MAGNESIA) suspension 30 mL  30 mL Oral Daily PRN Lindon Romp A, NP      . nicotine (NICODERM CQ - dosed in mg/24 hours) patch 21 mg  21 mg Transdermal Daily Lindon Romp A, NP   21 mg at 11/15/18 0816  . traZODone (DESYREL) tablet 100 mg  100 mg Oral QHS PRN Rozetta Nunnery, NP       PTA Medications: Medications Prior to Admission  Medication Sig Dispense Refill Last Dose  . ARIPiprazole (ABILIFY) 10 MG tablet Take 10 mg by mouth daily.  0 11/13/2018 at Unknown time  . benzonatate (TESSALON) 100 MG capsule Take 1 capsule (100 mg total) by mouth every 8 (eight) hours. (Patient not taking: Reported on 11/14/2018) 21 capsule 0 Not Taking  . doxycycline (VIBRAMYCIN) 100 MG capsule Take 1 capsule (100 mg total) by mouth 2 (two) times daily. One po bid x 7 days (Patient not taking: Reported on 11/14/2018) 14 capsule 0 Not Taking at Unknown time  . Doxylamine-Pyridoxine 10-10 MG TBEC Take 10 mg by mouth 4 (four) times daily. One in the morning, one mid afternoon, two at bedtime (Patient not taking: Reported on 04/26/2018) 100 tablet 3 Not Taking at Unknown time  . escitalopram (LEXAPRO) 20 MG tablet Take 20 mg by mouth daily.  2 Not Taking at Unknown time  . gabapentin (NEURONTIN) 600 MG tablet Take 1 tablet (600 mg total) by mouth 3 (three) times daily. 90 tablet 5 Past Week at Unknown time  . HYDROcodone-acetaminophen (NORCO) 5-325 MG tablet Take 1 tablet by mouth every 6 (six) hours as needed for moderate pain. (Patient not taking: Reported on 11/14/2018) 15 tablet 0 Not Taking at Unknown time  . hydrOXYzine (ATARAX/VISTARIL) 50 MG tablet Take 50 mg by mouth 3 (three) times daily.  0 Past Month at Unknown time  . ibuprofen (ADVIL,MOTRIN) 600 MG tablet Take 1 tablet (600 mg total) by mouth every 6 (six) hours as needed. (Patient not taking: Reported on 11/14/2018) 20 tablet 0 Not Taking at Unknown time  . ondansetron (ZOFRAN ODT) 4 MG disintegrating tablet Take 1 tablet (4 mg  total) by mouth every 8 (eight) hours as needed for nausea or vomiting. (Patient not taking: Reported on 11/14/2018) 20 tablet 0 Not Taking at Unknown time  . ondansetron (ZOFRAN) 4 MG tablet Take 1 tablet (4 mg total) by mouth every 6 (six) hours. (Patient not taking: Reported on 11/14/2018) 6 tablet 0 Not Taking at Unknown time  . oseltamivir (TAMIFLU) 75 MG capsule Take 1 capsule (75 mg total) by mouth every 12 (twelve) hours. (Patient not taking: Reported on  11/14/2018) 10 capsule 0 Not Taking at Unknown time  . traZODone (DESYREL) 100 MG tablet Take 100 mg by mouth at bedtime as needed for sleep.   11/13/2018 at Unknown time    Musculoskeletal: Strength & Muscle Tone: within normal limits Gait & Station: normal Patient leans: N/A  Psychiatric Specialty Exam: Physical Exam  Nursing note and vitals reviewed. Constitutional: She is oriented to person, place, and time. She appears well-developed and well-nourished.  HENT:  Head: Normocephalic and atraumatic.  Respiratory: Effort normal.  Neurological: She is alert and oriented to person, place, and time.    ROS  Blood pressure (!) 144/72, pulse 91, temperature 98.9 F (37.2 C), temperature source Oral, resp. rate 18, height 5\' 8"  (1.727 m), weight (!) 140.6 kg, last menstrual period 11/07/2018, unknown if currently breastfeeding.Body mass index is 47.14 kg/m.  General Appearance: Disheveled  Eye Contact:  Fair  Speech:  Normal Rate  Volume:  Decreased  Mood:  Depressed  Affect:  Congruent  Thought Process:  Coherent and Descriptions of Associations: Intact  Orientation:  Full (Time, Place, and Person)  Thought Content:  Logical  Suicidal Thoughts:  No  Homicidal Thoughts:  No  Memory:  Immediate;   Fair Recent;   Fair Remote;   Fair  Judgement:  Intact  Insight:  Fair  Psychomotor Activity:  Decreased and Psychomotor Retardation  Concentration:  Concentration: Fair and Attention Span: Fair  Recall:  [employer] of  Knowledge:  Fair  Language:  Good  Akathisia:  Negative  Handed:  Right  AIMS (if indicated):     Assets:  Communication Skills Desire for Improvement Financial Resources/Insurance Housing Intimacy Physical Health Resilience Social Support  ADL's:  Intact  Cognition:  WNL  Sleep:  Number of Hours: 1.25    Treatment Plan Summary: Daily contact with patient to assess and evaluate symptoms and progress in treatment, Medication management and Plan : Patient is seen and examined.  Patient is a 22 year old female with the above-stated past psychiatric history who was admitted secondary to substances as well as suicidal ideation.  She currently denies suicidal ideation.  She will be admitted to the hospital.  She will be integrated into the milieu.  She will be restarted on her Abilify and gabapentin.  She will be monitored for withdrawal symptoms.  Her blood pressure is mildly elevated today, and she stated that she had previously had blood pressure problems when she was an adolescent.  Her drug screen was positive for cocaine and marijuana.  Her beta hCG was negative.  Liver function enzymes are normal.  Her white blood cell count was mildly elevated at 14.6.  Her hemoglobin and hematocrit were both elevated at 15.6 and 47.1.  Currently she stated she is called her work and she has a week off to try and get better, but right now did not want to seek a residential treatment program.  Observation Level/Precautions:  Detox 15 minute checks  Laboratory:  Chemistry Profile  Psychotherapy:    Medications:    Consultations:    Discharge Concerns:    Estimated LOS:  Other:     Physician Treatment Plan for Primary Diagnosis: <principal problem not specified> Long Term Goal(s): Improvement in symptoms so as ready for discharge  Short Term Goals: Ability to identify changes in lifestyle to reduce recurrence of condition will improve, Ability to verbalize feelings will improve, Ability to disclose  and discuss suicidal ideas, Ability to demonstrate self-control will improve, Ability to identify and develop  effective coping behaviors will improve, Ability to maintain clinical measurements within normal limits will improve, Compliance with prescribed medications will improve and Ability to identify triggers associated with substance abuse/mental health issues will improve  Physician Treatment Plan for Secondary Diagnosis: Active Problems:   Severe recurrent major depression without psychotic features ([place])  Long Term Goal(s): Improvement in symptoms so as ready for discharge  Short Term Goals: Ability to identify changes in lifestyle to reduce recurrence of condition will improve, Ability to verbalize feelings will improve, Ability to disclose and discuss suicidal ideas, Ability to demonstrate self-control will improve, Ability to identify and develop effective coping behaviors will improve, Ability to maintain clinical measurements within normal limits will improve, Compliance with prescribed medications will improve and Ability to identify triggers associated with substance abuse/mental health issues will improve  I certify that inpatient services furnished can reasonably be expected to improve the patient's condition.    Sharma Covert, MD 12/17/201910:13 AM

## 2018-11-15 NOTE — BH Assessment (Signed)
[place] Assessment Progress Note   Patient has signed voluntary admission papers for [facility].  Pt accepted to [facility] per [facility] Wynonia Hazard.  Pt will go to [facility] 302-2 to Dr. Parke Poisson.  Clinician faxed voluntary admission papers to [facility].

## 2018-11-15 NOTE — Progress Notes (Signed)
Recreation Therapy Notes  Animal-Assisted Activity (AAA) Program Checklist/Progress Notes Patient Eligibility Criteria Checklist & Daily Group note for Rec Tx Intervention  Date: 12.17.19 Time: 1430 Location: [address]   AAA/T Program Assumption of Risk Form signed by Teacher, music or Parent Legal Guardian  YES  Patient is free of allergies or sever asthma  YES   Patient reports no fear of animals  YES   Patient reports no history of cruelty to animals YES   Patient understands his/her participation is voluntary  YES   Patient washes hands before animal contact YES  Patient washes hands after animal contact  YES   Behavioral Response: Engaged  Education: Contractor, Appropriate Animal Interaction   Education Outcome: Acknowledges understanding/In group clarification offered/Needs additional education.   Clinical Observations/Feedback: Pt attended group activity.    Victorino Sparrow, LRT/CTRS         Victorino Sparrow A 11/15/2018 2:58 PM

## 2018-11-15 NOTE — ED Notes (Signed)
Called [facility] to give report. RN unable at time to give report. She will call back when available.

## 2018-11-15 NOTE — Tx Team (Signed)
Initial Treatment Plan 11/15/2018 5:05 AM [name] QAS:[id number]    PATIENT STRESSORS: Financial difficulties Substance abuse Traumatic event   PATIENT STRENGTHS: Capable of independent living Curator fund of knowledge Motivation for treatment/growth Supportive family/friends Work skills   PATIENT IDENTIFIED PROBLEMS: Depression  Suicidal ideation  Substance abuse  "Stop smoking weed"  "Stop using drugs"  "Stop smoking cigarettes"           DISCHARGE CRITERIA:  Improved stabilization in mood, thinking, and/or behavior Motivation to continue treatment in a less acute level of care Need for constant or close observation no longer present Reduction of life-threatening or endangering symptoms to within safe limits Verbal commitment to aftercare and medication compliance  PRELIMINARY DISCHARGE PLAN: Outpatient therapy Medication management  PATIENT/FAMILY INVOLVEMENT: This treatment plan has been presented to and reviewed with the patient, Natasha Long.  The patient and family have been given the opportunity to ask questions and make suggestions.  Windell Moment, RN 11/15/2018, 5:05 AM

## 2018-11-15 NOTE — BHH Group Notes (Signed)
Pt was invited but did not attend orientation/goals group facilitated by [facility] Lequisha C.

## 2018-11-15 NOTE — Plan of Care (Signed)
  Problem: Activity: Goal: Interest or engagement in activities will improve Outcome: Progressing   Problem: Safety: Goal: Periods of time without injury will increase Outcome: Progressing  DAR NOTE: Patient presents with anxious affect and depressed mood.  Denies suicidal thoughts, pain, auditory and visual hallucinations.  Rates depression at 6, hopelessness at 7, and anxiety at 9.  Maintained on routine safety checks.  Medications given as prescribed.  Support and encouragement offered as needed.  Attended group and participated.  States goal for today is "my sobriety."  Patient observed socializing with peers in the dayroom.  Requested and received Vistaril 50 mg for complain of anxiety with good effect.  Patient is safe on and off the unit.

## 2018-11-15 NOTE — Progress Notes (Signed)
Gave report to Miquel Dunn, Therapist, sports at [facility]. Patient is stable for transfer. Called [facility] for transfer.

## 2018-11-15 NOTE — ED Notes (Signed)
[facility] called and stated that patient would be out of pocket pay d/t her medicaid not being active. Hazel form [facility] stated that she looked patient up on medicaid portal and her status was not active; therefore, she stated she would be self pay. Called [facility] to bring patient back to room. Notified Marcus, TTS. Beverely Low stated that he would try to get her to transferred to [facility] if possible.

## 2018-11-15 NOTE — ED Notes (Signed)
TTS recommends inpatient. [facility] has accepted her. Called [facility] for transportation.

## 2018-11-15 NOTE — Progress Notes (Signed)
[facility] here to transport patient to [facility] bed 302-2.

## 2018-11-16 DIAGNOSIS — F603 Borderline personality disorder: Secondary | ICD-10-CM

## 2018-11-16 DIAGNOSIS — F1220 Cannabis dependence, uncomplicated: Secondary | ICD-10-CM

## 2018-11-16 MED ORDER — GABAPENTIN 800 MG PO TABS
800.0000 mg | ORAL_TABLET | Freq: Three times a day (TID) | ORAL | Status: DC
  Administered 2018-11-16 – 2018-11-18 (×6): 800 mg via ORAL
  Filled 2018-11-16 (×12): qty 1

## 2018-11-16 MED ORDER — ZIPRASIDONE MESYLATE 20 MG IM SOLR
INTRAMUSCULAR | Status: AC
  Administered 2018-11-16: 20 mg via INTRAMUSCULAR
  Filled 2018-11-16: qty 20

## 2018-11-16 MED ORDER — ZIPRASIDONE MESYLATE 20 MG IM SOLR
20.0000 mg | Freq: Once | INTRAMUSCULAR | Status: AC
  Administered 2018-11-16: 20 mg via INTRAMUSCULAR

## 2018-11-16 MED ORDER — ARIPIPRAZOLE 15 MG PO TABS
15.0000 mg | ORAL_TABLET | Freq: Every day | ORAL | Status: DC
  Administered 2018-11-17 – 2018-11-18 (×2): 15 mg via ORAL
  Filled 2018-11-16 (×4): qty 1

## 2018-11-16 MED ORDER — LORAZEPAM 1 MG PO TABS: 1.0000 mg | ORAL_TABLET | Freq: Four times a day (QID) | ORAL | Status: DC | PRN

## 2018-11-16 NOTE — Progress Notes (Signed)
Patient did attend the evening speaker NA meeting.  

## 2018-11-16 NOTE — Progress Notes (Signed)
[facility] MD Progress Note  11/16/2018 10:43 AM Natasha Long  MRN:  [id number] Subjective: Patient is seen and examined.  Patient is a 22 year old female with a past psychiatric history significant for cocaine dependence, marijuana dependence, borderline personality disorder who was admitted on 11/15/2018 with the chief complaint of wanting to get clean.  Objective: Patient is seen and examined.  Patient became significantly irritated after I did not answer the door fast enough.  Unfortunately I was on the telephone.  She stated earlier this a.m. she was extremely irritable.  She reports her agitation due to withdrawal symptoms and not feeling well.  Fortunately with their irritation she was loud and demanding.  The decision was made to give her Geodon 20 mg IM x1.  She was unable to be de-escalated.  She has a long history of agitation and irritability as well as behavior that led to her admission to psychiatric facilities over 20 times, and being moved from multiple living circumstances.  She does not appear to be psychotic at this time.  Principal Problem: <principal problem not specified> Diagnosis: Active Problems:   Severe recurrent major depression without psychotic features ([place])  Total Time spent with patient: 15 minutes  Past Psychiatric History: See admission H&P  Past Medical History:  Past Medical History:  Diagnosis Date  . Bipolar affective, manic ([place])   . Hypertension    diagnosed as child; stopped meds at 39 yo  . Hypoglycemia   . Insomnia   . Nephrotic syndrome   . Nephrotic syndrome   . PTSD (post-traumatic stress disorder)   . Renal disease     Past Surgical History:  Procedure Laterality Date  . CHOLECYSTECTOMY    . extraction of wisdom teeth    . RENAL BIOPSY     Family History:  Family History  Problem Relation Age of Onset  . Diabetes Other   . Hypertension Other    Family Psychiatric  History: See admission H&P Social History:  Social History    Substance and Sexual Activity  Alcohol Use No     Social History   Substance and Sexual Activity  Drug Use Yes  . Types: Marijuana, Cocaine    Social History   Socioeconomic History  . Marital status: Single    Spouse name: Not on file  . Number of children: Not on file  . Years of education: Not on file  . Highest education level: Not on file  Occupational History  . Not on file  Social Needs  . Financial resource strain: Not on file  . Food insecurity:    Worry: Not on file    Inability: Not on file  . Transportation needs:    Medical: Not on file    Non-medical: Not on file  Tobacco Use  . Smoking status: Light Tobacco Smoker    Types: Cigarettes  . Smokeless tobacco: Never Used  Substance and Sexual Activity  . Alcohol use: No  . Drug use: Yes    Types: Marijuana, Cocaine  . Sexual activity: Yes    Birth control/protection: None  Lifestyle  . Physical activity:    Days per week: Not on file    Minutes per session: Not on file  . Stress: Not on file  Relationships  . Social connections:    Talks on phone: Not on file    Gets together: Not on file    Attends religious service: Not on file    Active member of club or organization: Not on  file    Attends meetings of clubs or organizations: Not on file    Relationship status: Not on file  Other Topics Concern  . Not on file  Social History Narrative  . Not on file   Additional Social History:                         Sleep: Good  Appetite:  Good  Current Medications: Current Facility-Administered Medications  Medication Dose Route Frequency Provider Last Rate Last Dose  . acetaminophen (TYLENOL) tablet 650 mg  650 mg Oral Q6H PRN Lindon Romp A, NP      . alum & mag hydroxide-simeth (MAALOX/MYLANTA) 200-200-20 MG/5ML suspension 30 mL  30 mL Oral Q4H PRN Lindon Romp A, NP      . ARIPiprazole (ABILIFY) tablet 10 mg  10 mg Oral Daily Lindon Romp A, NP   10 mg at 11/16/18 0827  .  gabapentin (NEURONTIN) tablet 600 mg  600 mg Oral TID Lindon Romp A, NP   600 mg at 11/16/18 0827  . hydrOXYzine (ATARAX/VISTARIL) tablet 50 mg  50 mg Oral TID PRN Rozetta Nunnery, NP   50 mg at 11/16/18 0827  . magnesium hydroxide (MILK OF MAGNESIA) suspension 30 mL  30 mL Oral Daily PRN Lindon Romp A, NP      . nicotine (NICODERM CQ - dosed in mg/24 hours) patch 21 mg  21 mg Transdermal Daily Lindon Romp A, NP   21 mg at 11/16/18 0827  . traZODone (DESYREL) tablet 100 mg  100 mg Oral QHS PRN Lindon Romp A, NP   100 mg at 11/15/18 2137  . ziprasidone (GEODON) injection 20 mg  20 mg Intramuscular Once Sharma Covert, MD        Lab Results:  Results for orders placed or performed during the hospital encounter of 11/14/18 (from the past 48 hour(s))  Comprehensive metabolic panel     Status: Abnormal   Collection Time: 11/14/18  4:45 PM  Result Value Ref Range   Sodium 140 135 - 145 mmol/L   Potassium 3.7 3.5 - 5.1 mmol/L   Chloride 109 98 - 111 mmol/L   CO2 26 22 - 32 mmol/L   Glucose, Bld 116 (H) 70 - 99 mg/dL   BUN 13 6 - 20 mg/dL   Creatinine, Ser 0.95 0.44 - 1.00 mg/dL   Calcium 9.4 8.9 - 10.3 mg/dL   Total Protein 6.9 6.5 - 8.1 g/dL   Albumin 3.6 3.5 - 5.0 g/dL   AST 21 15 - 41 U/L   ALT 22 0 - 44 U/L   Alkaline Phosphatase 48 38 - 126 U/L   Total Bilirubin 0.3 0.3 - 1.2 mg/dL   GFR calc non Af Amer >60 >60 mL/min   GFR calc Af Amer >60 >60 mL/min   Anion gap 5 5 - 15    Comment: Performed at [facility], [place] [address] [place], [place] [address]  cbc     Status: Abnormal   Collection Time: 11/14/18  4:45 PM  Result Value Ref Range   WBC 14.6 (H) 4.0 - 10.5 K/uL   RBC 5.11 3.87 - 5.11 MIL/uL   Hemoglobin 15.6 (H) 12.0 - 15.0 g/dL   HCT 47.1 (H) 36.0 - 46.0 %   MCV 92.2 80.0 - 100.0 fL   MCH 30.5 26.0 - 34.0 pg   MCHC 33.1 30.0 - 36.0 g/dL   RDW 13.2 11.5 - 15.5 %  Platelets 297 150 - 400 K/uL   nRBC 0.0 0.0 - 0.2 %    Comment: Performed at  [facility], [place] [address], [place], [place] [address]  Ethanol     Status: None   Collection Time: 11/14/18  4:46 PM  Result Value Ref Range   Alcohol, Ethyl (B) <10 <10 mg/dL    Comment: (NOTE) Lowest detectable limit for serum alcohol is 10 mg/dL. For medical purposes only. Performed at [facility], [place] [address], [place], [place] [address]   Rapid urine drug screen (hospital performed)     Status: Abnormal   Collection Time: 11/14/18  4:46 PM  Result Value Ref Range   Opiates NONE DETECTED NONE DETECTED   Cocaine POSITIVE (A) NONE DETECTED   Benzodiazepines NONE DETECTED NONE DETECTED   Amphetamines NONE DETECTED NONE DETECTED   Tetrahydrocannabinol POSITIVE (A) NONE DETECTED   Barbiturates NONE DETECTED NONE DETECTED    Comment: (NOTE) DRUG SCREEN FOR MEDICAL PURPOSES ONLY.  IF CONFIRMATION IS NEEDED FOR ANY PURPOSE, NOTIFY LAB WITHIN 5 DAYS. LOWEST DETECTABLE LIMITS FOR URINE DRUG SCREEN Drug Class                     Cutoff (ng/mL) Amphetamine and metabolites    1000 Barbiturate and metabolites    200 Benzodiazepine                 379 Tricyclics and metabolites     300 Opiates and metabolites        300 Cocaine and metabolites        300 THC                            50 Performed at [facility], [place] [address], [place], [place] [address]   I-Stat beta hCG blood, ED     Status: None   Collection Time: 11/14/18  4:49 PM  Result Value Ref Range   I-stat hCG, quantitative <5.0 <5 mIU/mL   Comment 3            Comment:   GEST. AGE      CONC.  (mIU/mL)   <=1 WEEK        5 - 50     2 WEEKS       50 - 500     3 WEEKS       100 - 10,000     4 WEEKS     1,000 - 30,000        FEMALE AND NON-PREGNANT FEMALE:     LESS THAN 5 mIU/mL     Blood Alcohol level:  Lab Results  Component Value Date   ETH <10 11/14/2018   ETH <5 73/53/2992    Metabolic Disorder Labs: No results found for: HGBA1C, MPG No  results found for: PROLACTIN Lab Results  Component Value Date   CHOL 169 09/15/2017   TRIG 196 (H) 09/15/2017   HDL 53 09/15/2017   CHOLHDL 3.2 09/15/2017   LDLCALC 77 09/15/2017    Physical Findings: AIMS: Facial and Oral Movements Muscles of Facial Expression: None, normal Lips and Perioral Area: None, normal Jaw: None, normal Tongue: None, normal,Extremity Movements Upper (arms, wrists, hands, fingers): None, normal Lower (legs, knees, ankles, toes): None, normal, Trunk Movements Neck, shoulders, hips: None, normal, Overall Severity Severity of abnormal movements (highest score from questions above): None, normal Incapacitation due to abnormal movements:  None, normal Patient's awareness of abnormal movements (rate only patient's report): No Awareness, Dental Status Current problems with teeth and/or dentures?: No Does patient usually wear dentures?: No  CIWA:    COWS:     Musculoskeletal: Strength & Muscle Tone: within normal limits Gait & Station: normal Patient leans: N/A  Psychiatric Specialty Exam: Physical Exam  Nursing note and vitals reviewed. Constitutional: She is oriented to person, place, and time. She appears well-developed and well-nourished.  HENT:  Head: Normocephalic and atraumatic.  Respiratory: Effort normal.  Neurological: She is alert and oriented to person, place, and time.    ROS  Blood pressure 139/78, pulse 62, temperature 98.2 F (36.8 C), temperature source Oral, resp. rate 20, height 5\' 8"  (1.727 m), weight (!) 140.6 kg, last menstrual period 11/07/2018, unknown if currently breastfeeding.Body mass index is 47.14 kg/m.  General Appearance: Casual  Eye Contact:  Minimal  Speech:  Pressured  Volume:  Increased  Mood:  Angry and Irritable  Affect:  Congruent  Thought Process:  Coherent and Descriptions of Associations: Intact  Orientation:  Full (Time, Place, and Person)  Thought Content:  Logical  Suicidal Thoughts:  No  Homicidal  Thoughts:  No  Memory:  Immediate;   Fair Recent;   Fair Remote;   Fair  Judgement:  Impaired  Insight:  Lacking  Psychomotor Activity:  Increased  Concentration:  Concentration: Fair and Attention Span: Fair  Recall:  [employer] of Knowledge:  Fair  Language:  Good  Akathisia:  Negative  Handed:  Right  AIMS (if indicated):     Assets:  Desire for Improvement Physical Health  ADL's:  Intact  Cognition:  WNL  Sleep:  Number of Hours: 6.75     Treatment Plan Summary: Daily contact with patient to assess and evaluate symptoms and progress in treatment, Medication management and Plan : Patient is seen and examined.  Patient is a 22 year old female with the above-stated past psychiatric history who is admitted secondary to suicidal ideation as well as relapse on substances.  She was restarted on her Abilify and gabapentin on admission.  Is significantly irritated this morning.  She will receive Geodon 20 mg IM x1.  Hopefully that will calm her down.  I am also going to increase her gabapentin to 800 mg p.o. 3 times daily for mood stability, and as well increase her Abilify to 15 mg p.o. nightly. 1.  Increase Abilify to 15 mg p.o. daily. 2.  Increase gabapentin to 800 mg p.o. 3 times daily to improve mood stability.. Continue trazodone 100 mg p.o. nightly for insomnia. 3.  Disposition planning-in progress  Sharma Covert, MD 11/16/2018, 10:43 AM

## 2018-11-16 NOTE — Therapy (Signed)
Occupational Therapy Group Note  Date:  11/16/2018 Time:  3:11 PM  Group Topic/Focus:  Stress Management  Participation Level:  Minimal  Participation Quality:  Inattentive  Affect:  Flat  Cognitive:  Appropriate  Insight: Lacking  Engagement in Group:  Limited  Modes of Intervention:  Activity, Discussion, Education and Socialization  Additional Comments:    S: no subjective given  O: Education given on healthy stress management.Stress management tools worksheet discussed to differentiate negative vs positive coping skills. Pt asked to identify positive coping skills this date to use when reintegrating into community.  ?  A: Pt presents to group with flat affect, in and out of the room and not participating. After group, pt comes up to OT and states "you marked me down for group right?"  ?  P: Pt provided with education on stress management activities to implement into daily routine. Handouts given to facilitate carryover when reintegrating into community.   Zenovia Jarred, MSOT, OTR/L Behavioral Health OT/ Acute Relief OT PHP Office: [place] 11/16/2018, 3:11 PM

## 2018-11-16 NOTE — Tx Team (Signed)
Interdisciplinary Treatment and Diagnostic Plan Update  11/16/2018 Time of Session: [id number] Natasha Long MRN: [id number]  Principal Diagnosis: MDD, recurrent, severe without psychotic features  Secondary Diagnoses: Active Problems:   Severe recurrent major depression without psychotic features ([facility])   Current Medications:  Current Facility-Administered Medications  Medication Dose Route Frequency Provider Last Rate Last Dose  . acetaminophen (TYLENOL) tablet 650 mg  650 mg Oral Q6H PRN Lindon Romp A, NP      . alum & mag hydroxide-simeth (MAALOX/MYLANTA) 200-200-20 MG/5ML suspension 30 mL  30 mL Oral Q4H PRN Rozetta Nunnery, NP      . Derrill Memo ON 11/17/2018] ARIPiprazole (ABILIFY) tablet 15 mg  15 mg Oral Daily Sharma Covert, MD      . gabapentin (NEURONTIN) tablet 800 mg  800 mg Oral TID Sharma Covert, MD      . hydrOXYzine (ATARAX/VISTARIL) tablet 50 mg  50 mg Oral TID PRN Rozetta Nunnery, NP   50 mg at 11/16/18 0827  . LORazepam (ATIVAN) tablet 1 mg  1 mg Oral Q6H PRN Sharma Covert, MD      . magnesium hydroxide (MILK OF MAGNESIA) suspension 30 mL  30 mL Oral Daily PRN Lindon Romp A, NP      . nicotine (NICODERM CQ - dosed in mg/24 hours) patch 21 mg  21 mg Transdermal Daily Lindon Romp A, NP   21 mg at 11/16/18 0827  . traZODone (DESYREL) tablet 100 mg  100 mg Oral QHS PRN Lindon Romp A, NP   100 mg at 11/15/18 2137   PTA Medications: Medications Prior to Admission  Medication Sig Dispense Refill Last Dose  . ARIPiprazole (ABILIFY) 10 MG tablet Take 10 mg by mouth daily.  0 11/13/2018 at Unknown time  . benzonatate (TESSALON) 100 MG capsule Take 1 capsule (100 mg total) by mouth every 8 (eight) hours. (Patient not taking: Reported on 11/14/2018) 21 capsule 0 Not Taking  . doxycycline (VIBRAMYCIN) 100 MG capsule Take 1 capsule (100 mg total) by mouth 2 (two) times daily. One po bid x 7 days (Patient not taking: Reported on 11/14/2018) 14 capsule 0 Not Taking at  Unknown time  . Doxylamine-Pyridoxine 10-10 MG TBEC Take 10 mg by mouth 4 (four) times daily. One in the morning, one mid afternoon, two at bedtime (Patient not taking: Reported on 04/26/2018) 100 tablet 3 Not Taking at Unknown time  . escitalopram (LEXAPRO) 20 MG tablet Take 20 mg by mouth daily.  2 Not Taking at Unknown time  . gabapentin (NEURONTIN) 600 MG tablet Take 1 tablet (600 mg total) by mouth 3 (three) times daily. 90 tablet 5 Past Week at Unknown time  . HYDROcodone-acetaminophen (NORCO) 5-325 MG tablet Take 1 tablet by mouth every 6 (six) hours as needed for moderate pain. (Patient not taking: Reported on 11/14/2018) 15 tablet 0 Not Taking at Unknown time  . hydrOXYzine (ATARAX/VISTARIL) 50 MG tablet Take 50 mg by mouth 3 (three) times daily.  0 Past Month at Unknown time  . ibuprofen (ADVIL,MOTRIN) 600 MG tablet Take 1 tablet (600 mg total) by mouth every 6 (six) hours as needed. (Patient not taking: Reported on 11/14/2018) 20 tablet 0 Not Taking at Unknown time  . ondansetron (ZOFRAN ODT) 4 MG disintegrating tablet Take 1 tablet (4 mg total) by mouth every 8 (eight) hours as needed for nausea or vomiting. (Patient not taking: Reported on 11/14/2018) 20 tablet 0 Not Taking at Unknown time  . ondansetron (ZOFRAN) 4 MG  tablet Take 1 tablet (4 mg total) by mouth every 6 (six) hours. (Patient not taking: Reported on 11/14/2018) 6 tablet 0 Not Taking at Unknown time  . oseltamivir (TAMIFLU) 75 MG capsule Take 1 capsule (75 mg total) by mouth every 12 (twelve) hours. (Patient not taking: Reported on 11/14/2018) 10 capsule 0 Not Taking at Unknown time  . traZODone (DESYREL) 100 MG tablet Take 100 mg by mouth at bedtime as needed for sleep.   11/13/2018 at Unknown time    Patient Stressors: Financial difficulties Substance abuse Traumatic event  Patient Strengths: Capable of independent living Curator fund of knowledge Motivation for treatment/growth Supportive  family/friends Work skills  Treatment Modalities: Medication Management, Group therapy, Case management,  1 to 1 session with clinician, Psychoeducation, Recreational therapy.   Physician Treatment Plan for Primary Diagnosis: MDD, recurrent, severe without psychotic features Long Term Goal(s): Improvement in symptoms so as ready for discharge Improvement in symptoms so as ready for discharge   Short Term Goals: Ability to identify changes in lifestyle to reduce recurrence of condition will improve Ability to verbalize feelings will improve Ability to disclose and discuss suicidal ideas Ability to demonstrate self-control will improve Ability to identify and develop effective coping behaviors will improve Ability to maintain clinical measurements within normal limits will improve Compliance with prescribed medications will improve Ability to identify triggers associated with substance abuse/mental health issues will improve Ability to identify changes in lifestyle to reduce recurrence of condition will improve Ability to verbalize feelings will improve Ability to disclose and discuss suicidal ideas Ability to demonstrate self-control will improve Ability to identify and develop effective coping behaviors will improve Ability to maintain clinical measurements within normal limits will improve Compliance with prescribed medications will improve Ability to identify triggers associated with substance abuse/mental health issues will improve  Medication Management: Evaluate patient's response, side effects, and tolerance of medication regimen.  Therapeutic Interventions: 1 to 1 sessions, Unit Group sessions and Medication administration.  Evaluation of Outcomes: Progressing  Physician Treatment Plan for Secondary Diagnosis: Active Problems:   Severe recurrent major depression without psychotic features ([place])  Long Term Goal(s): Improvement in symptoms so as ready for  discharge Improvement in symptoms so as ready for discharge   Short Term Goals: Ability to identify changes in lifestyle to reduce recurrence of condition will improve Ability to verbalize feelings will improve Ability to disclose and discuss suicidal ideas Ability to demonstrate self-control will improve Ability to identify and develop effective coping behaviors will improve Ability to maintain clinical measurements within normal limits will improve Compliance with prescribed medications will improve Ability to identify triggers associated with substance abuse/mental health issues will improve Ability to identify changes in lifestyle to reduce recurrence of condition will improve Ability to verbalize feelings will improve Ability to disclose and discuss suicidal ideas Ability to demonstrate self-control will improve Ability to identify and develop effective coping behaviors will improve Ability to maintain clinical measurements within normal limits will improve Compliance with prescribed medications will improve Ability to identify triggers associated with substance abuse/mental health issues will improve     Medication Management: Evaluate patient's response, side effects, and tolerance of medication regimen.  Therapeutic Interventions: 1 to 1 sessions, Unit Group sessions and Medication administration.  Evaluation of Outcomes: Progressing   RN Treatment Plan for Primary Diagnosis:MDD, recurrent, severe without psychotic features Long Term Goal(s): Knowledge of disease and therapeutic regimen to maintain health will improve  Short Term Goals: Ability to remain free from injury  will improve, Ability to verbalize frustration and anger appropriately will improve, Ability to demonstrate self-control and Ability to disclose and discuss suicidal ideas  Medication Management: RN will administer medications as ordered by provider, will assess and evaluate patient's response and provide  education to patient for prescribed medication. RN will report any adverse and/or side effects to prescribing provider.  Therapeutic Interventions: 1 on 1 counseling sessions, Psychoeducation, Medication administration, Evaluate responses to treatment, Monitor vital signs and CBGs as ordered, Perform/monitor CIWA, COWS, AIMS and Fall Risk screenings as ordered, Perform wound care treatments as ordered.  Evaluation of Outcomes: Progressing   LCSW Treatment Plan for Primary Diagnosis: MDD, recurrent, severe without psychotic features Long Term Goal(s): Safe transition to appropriate next level of care at discharge, Engage patient in therapeutic group addressing interpersonal concerns.  Short Term Goals: Engage patient in aftercare planning with referrals and resources, Facilitate patient progression through stages of change regarding substance use diagnoses and concerns and Identify triggers associated with mental health/substance abuse issues  Therapeutic Interventions: Assess for all discharge needs, 1 to 1 time with Social worker, Explore available resources and support systems, Assess for adequacy in community support network, Educate family and significant other(s) on suicide prevention, Complete Psychosocial Assessment, Interpersonal group therapy.  Evaluation of Outcomes: Progressing   Progress in Treatment: Attending groups: Yes. Participating in groups: Yes. Taking medication as prescribed: Yes. Toleration medication: Yes. Family/Significant other contact made: No, will contact:  pt's fiance to complete SPE with pt consent.  Patient understands diagnosis: Yes. Discussing patient identified problems/goals with staff: Yes. Medical problems stabilized or resolved: Yes. Denies suicidal/homicidal ideation: Yes. Issues/concerns per patient self-inventory: No. Other: n/a   New problem(s) identified: No, Describe:  n/a  New Short Term/Long Term Goal(s): detox, medication management for  mood stabilization; elimination of SI thoughts; development of comprehensive mental wellness/sobriety plan.   Patient Goals:  "I need help to get clean and get medication help."   Discharge Plan or Barriers: CSW assessing--pt plans to return home with fiance and has follow-up in place at [facility]. [place] pamphlet, Mobile Crisis information, and AA/NA information provided to patient for additional community support and resources.   Reason for Continuation of Hospitalization: Anxiety Depression Withdrawal symptoms Other; describe agitation  Estimated Length of Stay: Friday, 11/18/18  Attendees: Patient: Natasha Long 11/16/2018 11:59 AM  Physician: Dr. Mallie Darting MD 11/16/2018 11:59 AM  Nursing: Clarise Cruz RN; Alyssa RN 11/16/2018 11:59 AM  RN Care Manager:x 11/16/2018 11:59 AM  Social Worker: Janice Norrie LCSW 11/16/2018 11:59 AM  Recreational Therapist: x 11/16/2018 11:59 AM  Other: Lindell Spar NP 11/16/2018 11:59 AM  Other:  11/16/2018 11:59 AM  Other: 11/16/2018 11:59 AM    Scribe for Treatment Team: Avelina Laine, LCSW 11/16/2018 11:59 AM

## 2018-11-16 NOTE — Progress Notes (Signed)
Patient ID: Natasha Long, female   DOB: 1995-12-06, 22 y.o.   MRN: [id number]  Pt currently presents with a flat affect and labile behavior. Pt reports to writer that their goal is to "breath, cope, communicate." She wants to work on her anger. Pt states "I'm extremely irritable." Reports agitation to writer due to withdrawal symptoms and "not feeling well."  Pt reports * sleep with current medication regimen.   Pt provided with medications per providers orders. [name] labs and vitals were monitored throughout the night. Pt supported emotionally and encouraged to express concerns and questions. Pt educated on medications. Anger coping skills education refused, needs reinforcement.   [name] safety ensured with 15 minute and environmental checks. Pt currently denies SI/HI and A/V hallucinations. Pt verbally agrees to seek staff if SI/HI or A/VH occurs and to consult with staff before acting on any harmful thoughts. Pt overheard on the phone being nurturing and supportive to family member. Will continue POC.

## 2018-11-16 NOTE — Progress Notes (Signed)
Nursing Progress Note: 7p-7a D: Pt currently presents with a anxious/flat affect and behavior. Pt states "I was mad earlier, but I feel better now." Interacting appropriately with the milieu. Pt reports good sleep during the previous night with current medication regimen. Pt did attend wrap-up group.  A: Pt provided with medications per providers orders. [name] labs and vitals were monitored throughout the night. Pt supported emotionally and encouraged to express concerns and questions. Pt educated on medications.  R: [name] safety ensured with 15 minute and environmental checks. Pt currently denies SI, HI, and AVH. Pt verbally contracts to seek staff if SI,HI, or AVH occurs and to consult with staff before acting on any harmful thoughts. Will continue to monitor.

## 2018-11-16 NOTE — Progress Notes (Addendum)
Patient ID: Natasha Long, female   DOB: 1996-11-07, 22 y.o.   MRN: [id number]  Patient became irate after knocking on MD's door. Patient was observed yelling and cursing at the doctor at the nurses's station stating, "you're rude. I want a new psychiatrist". Patient then punched the wall twice and was redirected by staff. MD ordered 20 mg Geodon IM. Patient agreeable to taking injection. Patient informed she cannot exhibit anger with physical or verbal aggression to staff/property. Patient verbalizes understanding and contracts for safety with staff. Patient is tearful reporting she needs her anxiety medication adjusted. Patient is noted to have split dry skin on her second digit, R hand, but is not bleeding. No swelling, discoloration or dislocation noted. Patient denies need for ice, bandage or pain medication at this time. Will continue to support and monitor.

## 2018-11-16 NOTE — Plan of Care (Signed)
D: Patient is alert and cooperative. Patient endorses passive thoughts about self harm behaviors. Patient reports she is paranoid about evil entities. Patient denies SI, HI, AVH, and verbally contracts for safety. Patient reports anxiety rated 9/10. Patient reports vistaril does not help. Patient does not present as anxious. Patient requests xanax. Patient is easily redirected. Patient denies physical symptoms/pain. Patient states she has borderline personality and kidney disease.    A: Medications administered per MD order. This RN encouraged patient to speak to the doctor about her medications in the morning if she feels they aren't helping. Support provided. Patient educated on safety on the unit and medications. Routine safety checks every 15 minutes. Patient stated understanding to tell nurse about any new physical symptoms. Patient understands to tell staff of any needs.     R: No adverse drug reactions noted. Patient verbally contracts for safety. Patient remains safe at this time and will continue to monitor.   Problem: Education: Goal: Knowledge of [facility] Education information/materials will improve Outcome: Progressing   Problem: Safety: Goal: Periods of time without injury will increase Outcome: Progressing   Patient oriented to the unit. Patient remains safe and will continue to monitor.

## 2018-11-16 NOTE — BHH Group Notes (Signed)
[facility] Group Notes:  (Nursing/MHT/Case Management/Adjunct)  Date:  11/16/2018  Time:  4:00 PM  Type of Therapy:  Nurse Education  Participation Level:  Did Not Attend  Summary of Progress/Problems: Patient was invited to group but did not attend.  Megan Mans 11/16/2018, 6:46 PM

## 2018-11-16 NOTE — Progress Notes (Signed)
Recreation Therapy Notes  Date: 12.18.19 Time: 0930 Location: 300 Hall Dayroom  Group Topic: Stress Management  Goal Area(s) Addresses:  Patient will verbalize importance of using healthy stress management.  Patient will identify positive emotions associated with healthy stress management.   Intervention: Stress Management  Activity : Progressive Muscle Relaxation.  LRT introduced the stress management technique of progressive muscle relaxation.  LRT read a script guiding patients through the process of tensing and releasing each muscle one at a time.  Education:  Stress Management, Discharge Planning.   Education Outcome: Acknowledges edcuation/In group clarification offered/Needs additional education  Clinical Observations/Feedback: Pt did not attend group .    Victorino Sparrow, LRT/CTRS         Ria Comment, Ravis Herne A 11/16/2018 11:51 AM

## 2018-11-16 NOTE — Progress Notes (Signed)
Adult Psychoeducational Group Note  Date:  11/16/2018 Time:  2:11 AM  Group Topic/Focus:  Wrap-Up Group:   The focus of this group is to help patients review their daily goal of treatment and discuss progress on daily workbooks.  Participation Level:  Active  Participation Quality:  Appropriate  Affect:  Appropriate  Cognitive:  Appropriate  Insight: Appropriate  Engagement in Group:  Engaged  Modes of Intervention:  Discussion  Additional Comments:  Pt goal was to get through withdraws because her anxiety is on 100%.  Pt stated she did not meet her goals.  Pt rated the day at a 7/10.  Zaion Hreha 11/16/2018, 2:11 AM

## 2018-11-17 MED ORDER — HYDROXYZINE HCL 25 MG PO TABS
25.0000 mg | ORAL_TABLET | Freq: Three times a day (TID) | ORAL | Status: DC | PRN
  Administered 2018-11-17 (×3): 25 mg via ORAL
  Filled 2018-11-17: qty 1
  Filled 2018-11-17: qty 3
  Filled 2018-11-17: qty 1

## 2018-11-17 NOTE — Progress Notes (Signed)
Patient verbalized that her day was rated as a 10 out of 10 since her medications were adjusted. Her goal for tomorrow is to "be less perverted".

## 2018-11-17 NOTE — BHH Suicide Risk Assessment (Signed)
[facility] INPATIENT:  Family/Significant Other Suicide Prevention Education  Suicide Prevention Education:  Education Completed; Roanna Raider (pt's fiance) [phone] has been identified by the patient as the family member/significant other with whom the patient will be residing, and identified as the person(s) who will aid the patient in the event of a mental health crisis (suicidal ideations/suicide attempt).  With written consent from the patient, the family member/significant other has been provided the following suicide prevention education, prior to the and/or following the discharge of the patient.  The suicide prevention education provided includes the following:  Suicide risk factors  Suicide prevention and interventions  National Suicide Hotline telephone number  [facility] assessment telephone number  [facility] Emergency Assistance [place] and/or Residential Mobile Crisis Unit telephone number  Request made of family/significant other to:  Remove weapons (e.g., guns, rifles, knives), all items previously/currently identified as safety concern.    Remove drugs/medications (over-the-counter, prescriptions, illicit drugs), all items previously/currently identified as a safety concern.  The family member/significant other verbalizes understanding of the suicide prevention education information provided.  The family member/significant other agrees to remove the items of safety concern listed above.  SPE and aftercare reviewed with pt's fiance. He has no safety concerns regarding her returning home and plans to pick her up at 1pm on Friday, 11/18/18.   Avelina Laine LCSW 11/17/2018, 2:34 PM

## 2018-11-17 NOTE — BHH Group Notes (Signed)
The focus of this group is to help patients establish daily goals to achieve during treatment and discuss how the patient can incorporate goal setting into their daily lives to aide in recovery.  Pt wants to work on irritability and attitude today.

## 2018-11-17 NOTE — Progress Notes (Signed)
Patient denies SI, HI and AVH.  Patient has been compliant with medications attended groups and engaged in 1:1 staff talks.   Assess patient for safety engage patient in 1:1 staff talks, offer medications as prescribed.  Continue to monitor for safety.

## 2018-11-18 MED ORDER — HYDROXYZINE HCL 25 MG PO TABS: 25.0000 mg | ORAL_TABLET | Freq: Three times a day (TID) | ORAL | 0 refills | Status: DC | PRN

## 2018-11-18 MED ORDER — NICOTINE 21 MG/24HR TD PT24: 21.0000 mg | MEDICATED_PATCH | Freq: Every day | TRANSDERMAL | 0 refills | Status: DC

## 2018-11-18 MED ORDER — GABAPENTIN 800 MG PO TABS: 800.0000 mg | ORAL_TABLET | Freq: Three times a day (TID) | ORAL | 0 refills | Status: DC

## 2018-11-18 MED ORDER — ARIPIPRAZOLE 15 MG PO TABS: 15.0000 mg | ORAL_TABLET | Freq: Every day | ORAL | 0 refills | Status: DC

## 2018-11-18 MED ORDER — TRAZODONE HCL 100 MG PO TABS: 100.0000 mg | ORAL_TABLET | Freq: Every evening | ORAL | 0 refills | Status: DC | PRN

## 2018-11-18 NOTE — BHH Suicide Risk Assessment (Signed)
[facility] Discharge Suicide Risk Assessment   Principal Problem: Borderline personality disorder [facility]) Discharge Diagnoses: Principal Problem:   Borderline personality disorder ([place]) Active Problems:   Cocaine abuse ([place])   PTSD (post-traumatic stress disorder)   Moderate cannabis use disorder ([place])   Total Time spent with patient: 15 minutes  Musculoskeletal: Strength & Muscle Tone: within normal limits Gait & Station: normal Patient leans: N/A  Psychiatric Specialty Exam: Review of Systems  All other systems reviewed and are negative.   Blood pressure (!) 142/78, pulse 91, temperature 97.8 F (36.6 C), temperature source Oral, resp. rate 20, height 5\' 8"  (1.727 m), weight (!) 140.6 kg, last menstrual period 11/07/2018, unknown if currently breastfeeding.Body mass index is 47.14 kg/m.  General Appearance: Casual  Eye Contact::  Fair  Speech:  Normal Rate409  Volume:  Normal  Mood:  Euthymic  Affect:  Congruent  Thought Process:  Coherent and Descriptions of Associations: Intact  Orientation:  Full (Time, Place, and Person)  Thought Content:  Logical  Suicidal Thoughts:  No  Homicidal Thoughts:  No  Memory:  Immediate;   Fair Recent;   Fair Remote;   Fair  Judgement:  Intact  Insight:  Fair  Psychomotor Activity:  Normal  Concentration:  Fair  Recall:  [employer] of Knowledge:Fair  Language: Good  Akathisia:  Negative  Handed:  Right  AIMS (if indicated):     Assets:  Communication Skills Desire for Improvement Financial Resources/Insurance Housing Intimacy Physical Health Resilience Social Support  Sleep:  Number of Hours: 6.75  Cognition: WNL  ADL's:  Intact   Mental Status Per Nursing Assessment::   On Admission:  Self-harm thoughts  Demographic Factors:  Low socioeconomic status  Loss Factors: NA  Historical Factors: Impulsivity  Risk Reduction Factors:   Employed, Living with another person, especially a relative and Positive social  support  Continued Clinical Symptoms:  Bipolar Disorder:   Depressive phase Alcohol/Substance Abuse/Dependencies Personality Disorders:   Cluster B  Cognitive Features That Contribute To Risk:  None    Suicide Risk:  Minimal: No identifiable suicidal ideation.  Patients presenting with no risk factors but with morbid ruminations; may be classified as minimal risk based on the severity of the depressive symptoms  Follow-up Information    [facility]. Go on 11/21/2018.   Specialty:  Behavioral Health Why:  [facility]out hospital follow up appointment is Monday, 11/21/18 at 8:00a.  Contact information: [place] [address] [phone]        [facility] Outpatient Follow up.   Why:  If you move to [facility], Please walk in for assessment at [facility] Monday-Friday between 9am-11am. They can assess you for medication management and therapy services. Thank you.  Contact information: [address] [place], [place] [address] Phone: [phone] Fax: [phone]          Plan Of Care/Follow-up recommendations:  Activity:  ad lib  Sharma Covert, MD 11/18/2018, 7:42 AM

## 2018-11-18 NOTE — Discharge Summary (Signed)
Physician Discharge Summary Note  Patient:  Natasha Long is an 22 y.o., female MRN:  [id number] DOB:  [birth year] Patient phone:  [phone] (home)  Patient address:   [address] [place] [address],  Total Time spent with patient: 15 minutes  Date of Admission:  11/15/2018 Date of Discharge: 11/18/18  Reason for Admission: Cocaine and marijuana abuse  Principal Problem: Borderline personality disorder [facility]) Discharge Diagnoses: Principal Problem:   Borderline personality disorder ([place]) Active Problems:   Cocaine abuse ([place])   PTSD (post-traumatic stress disorder)   Moderate cannabis use disorder ([place])   Past Psychiatric History: See admission H&P  Past Medical History:  Past Medical History:  Diagnosis Date  . Bipolar affective, manic ([place])   . Hypertension    diagnosed as child; stopped meds at 10 yo  . Hypoglycemia   . Insomnia   . Nephrotic syndrome   . Nephrotic syndrome   . PTSD (post-traumatic stress disorder)   . Renal disease     Past Surgical History:  Procedure Laterality Date  . CHOLECYSTECTOMY    . extraction of wisdom teeth    . RENAL BIOPSY     Family History:  Family History  Problem Relation Age of Onset  . Diabetes Other   . Hypertension Other    Family Psychiatric  History: See admission H&P Social History:  Social History   Substance and Sexual Activity  Alcohol Use No     Social History   Substance and Sexual Activity  Drug Use Yes  . Types: Marijuana, Cocaine    Social History   Socioeconomic History  . Marital status: Single    Spouse name: Not on file  . Number of children: Not on file  . Years of education: Not on file  . Highest education level: Not on file  Occupational History  . Not on file  Social Needs  . Financial resource strain: Not on file  . Food insecurity:    Worry: Not on file    Inability: Not on file  . Transportation needs:    Medical: Not on file    Non-medical: Not on file   Tobacco Use  . Smoking status: Light Tobacco Smoker    Types: Cigarettes  . Smokeless tobacco: Never Used  Substance and Sexual Activity  . Alcohol use: No  . Drug use: Yes    Types: Marijuana, Cocaine  . Sexual activity: Yes    Birth control/protection: None  Lifestyle  . Physical activity:    Days per week: Not on file    Minutes per session: Not on file  . Stress: Not on file  Relationships  . Social connections:    Talks on phone: Not on file    Gets together: Not on file    Attends religious service: Not on file    Active member of club or organization: Not on file    Attends meetings of clubs or organizations: Not on file    Relationship status: Not on file  Other Topics Concern  . Not on file  Social History Narrative  . Not on file    Hospital Course: From MD's admission H&P: Patient is a 22 year old female with a past psychiatric history significant for cocaine dependence, marijuana dependence, borderline personality disorder, who presented with chief complaint that she wanted to get clean. The patient stated that she went to the emergency department at [facility] on 11/14/2018. She was looking for medical clearance to go to [facility] in [place].  She had last been hospitalized there in March or April 2018. She stated that she had relapsed on substances heavily over the last month or so. She stated she had tried to quit before, but was unable to. She had been prescribed Abilify and gabapentin at [facility]. She stated that over the last several days she had not been compliant with her medications. She stated that she recognizes that cocaine and marijuana are both a problem, but she has a hard time stopping marijuana. She stated she has had at least a months worth of sobriety over the last 6 months. She stated that she has been admitted to the psychiatric hospital over 20 times since she was an adolescent. She also has been previously diagnosed with PTSD and/or  bipolar disorder. She was admitted to the hospital for evaluation and stabilization.  Ms. Bidwell was admitted for cocaine and marijuana dependence.She required IM Geodon 20 mg x1 for acute agitation. She was treated and discharged with the medications listed below under Medication List. Ms. Aguilera responded well to treatment with Abilify, gabapentin and Vistaril without adverse effects. She also participated in group therapy on the unit to learn coping skills. Improvement was monitored by observation and Ms. Edward's daily report of symptom reduction.  Emotional and mental status was monitored by daily self-inventory reports completed by Ms. Marter and Building control surveyor.         Ms. Ficco was evaluated by the treatment team for stability and plans for continued recovery upon discharge. She will follow up with the services as listed below under Follow Up Information.     Upon completion of this admission the patient was both mentally and medically stable for discharge denying suicidal/homicidal ideation, auditory/visual/tactile hallucinations, delusional thoughts and paranoia.    Physical Findings: AIMS: Facial and Oral Movements Muscles of Facial Expression: None, normal Lips and Perioral Area: None, normal Jaw: None, normal Tongue: None, normal,Extremity Movements Upper (arms, wrists, hands, fingers): None, normal Lower (legs, knees, ankles, toes): None, normal, Trunk Movements Neck, shoulders, hips: None, normal, Overall Severity Severity of abnormal movements (highest score from questions above): None, normal Incapacitation due to abnormal movements: None, normal Patient's awareness of abnormal movements (rate only patient's report): No Awareness, Dental Status Current problems with teeth and/or dentures?: No Does patient usually wear dentures?: No  CIWA:    COWS:     Musculoskeletal: Strength & Muscle Tone: within normal limits Gait & Station: normal Patient leans:  N/A  Psychiatric Specialty Exam: Physical Exam  Nursing note and vitals reviewed. Constitutional: She is oriented to person, place, and time.  Neurological: She is alert and oriented to person, place, and time.    Review of Systems  Psychiatric/Behavioral: Positive for depression (Stable with medication) and substance abuse (Hx cocaine, THC). Negative for hallucinations, memory loss and suicidal ideas. The patient is not nervous/anxious and does not have insomnia.     Blood pressure (!) 142/78, pulse 91, temperature 97.8 F (36.6 C), temperature source Oral, resp. rate 20, height 5\' 8"  (1.727 m), weight (!) 140.6 kg, last menstrual period 11/07/2018, unknown if currently breastfeeding.Body mass index is 47.14 kg/m.  See MD's discharge SRA     Have you used any form of tobacco in the last 30 days? (Cigarettes, Smokeless Tobacco, Cigars, and/or Pipes): Yes  Has this patient used any form of tobacco in the last 30 days? (Cigarettes, Smokeless Tobacco, Cigars, and/or Pipes) No  Blood Alcohol level:  Lab Results  Component Value Date   ETH <10 11/14/2018  ETH <5 83/66/2947    Metabolic Disorder Labs:  No results found for: HGBA1C, MPG No results found for: PROLACTIN Lab Results  Component Value Date   CHOL 169 09/15/2017   TRIG 196 (H) 09/15/2017   HDL 53 09/15/2017   CHOLHDL 3.2 09/15/2017   LDLCALC 77 09/15/2017    See Psychiatric Specialty Exam and Suicide Risk Assessment completed by Attending Physician prior to discharge.  Discharge destination:  Home  Is patient on multiple antipsychotic therapies at discharge:  No   Has Patient had three or more failed trials of antipsychotic monotherapy by history:  No  Recommended Plan for Multiple Antipsychotic Therapies: NA  Discharge Instructions    Discharge instructions   Complete by:  As directed    Patient is instructed to take all prescribed medications as recommended. Report any side effects or adverse reactions to  your outpatient psychiatrist. Patient is instructed to abstain from alcohol and illegal drugs while on prescription medications. In the event of worsening symptoms, patient is instructed to call the crisis hotline, 911, or go to the nearest emergency department for evaluation and treatment.     Allergies as of 11/18/2018      Reactions   Prednisone Other (See Comments)   Pt states that this med caused pancreatitis.    Prozac [fluoxetine Hcl] Other (See Comments)   Panic attack    Wellbutrin [bupropion] Other (See Comments)   Panic attack      Medication List    STOP taking these medications   benzonatate 100 MG capsule Commonly known as:  TESSALON   doxycycline 100 MG capsule Commonly known as:  VIBRAMYCIN   Doxylamine-Pyridoxine 10-10 MG Tbec   escitalopram 20 MG tablet Commonly known as:  LEXAPRO   HYDROcodone-acetaminophen 5-325 MG tablet Commonly known as:  NORCO   ibuprofen 600 MG tablet Commonly known as:  ADVIL,MOTRIN   ondansetron 4 MG disintegrating tablet Commonly known as:  ZOFRAN ODT   ondansetron 4 MG tablet Commonly known as:  ZOFRAN   oseltamivir 75 MG capsule Commonly known as:  TAMIFLU     TAKE these medications     Indication  ARIPiprazole 15 MG tablet Commonly known as:  ABILIFY Take 1 tablet (15 mg total) by mouth daily. Start taking on:  November 19, 2018 What changed:    medication strength  how much to take  Indication:  Mood   gabapentin 800 MG tablet Commonly known as:  NEURONTIN Take 1 tablet (800 mg total) by mouth 3 (three) times daily. What changed:    medication strength  how much to take  Indication:  Agitation   hydrOXYzine 25 MG tablet Commonly known as:  ATARAX/VISTARIL Take 1 tablet (25 mg total) by mouth 3 (three) times daily as needed for anxiety. What changed:    medication strength  how much to take  when to take this  reasons to take this  Indication:  Feeling Anxious   nicotine 21 mg/24hr  patch Commonly known as:  NICODERM CQ - dosed in mg/24 hours Place 1 patch (21 mg total) onto the skin daily. (May buy over the counter) Start taking on:  November 19, 2018  Indication:  Nicotine Addiction   traZODone 100 MG tablet Commonly known as:  DESYREL Take 1 tablet (100 mg total) by mouth at bedtime as needed for sleep.  Indication:  Trouble Sleeping      Follow-up [facility]. Go on 11/21/2018.   Specialty:  Behavioral Health Why:  [facility] hospital follow up appointment is Monday, 11/21/18 at 8:00a.  Contact information: [place] [address] [phone]        [facility] Outpatient Follow up.   Why:  If you move to [facility], Please walk in for assessment at [facility] Monday-Friday between 9am-11am. They can assess you for medication management and therapy services. Thank you.  Contact information: [address] [place], [place] [address] Phone: [phone] Fax: [phone]          Follow-up recommendations: Activity as tolerated. Diet as recommended by primary care physician. Keep all scheduled follow-up appointments as recommended.    Comments:  Patient is instructed to take all prescribed medications as recommended. Report any side effects or adverse reactions to your outpatient psychiatrist. Patient is instructed to abstain from alcohol and illegal drugs while on prescription medications. In the event of worsening symptoms, patient is instructed to call the crisis hotline, 911, or go to the nearest emergency department for evaluation and treatment.  Signed: Connye Burkitt, NP 11/18/2018, 9:23 AM

## 2018-11-18 NOTE — Progress Notes (Signed)
Nursing Progress Note: 7p-7a D: Pt currently presents with a anxious/flat/pleasant affect and behavior. Pt states "I feel better today. No problems. The doctor and I are on the same page now." Interacting appropriately with the milieu. Pt reports good sleep during the previous night with current medication regimen. Pt did attend wrap-up group.  A: Pt provided with medications per providers orders. [name] labs and vitals were monitored throughout the night. Pt supported emotionally and encouraged to express concerns and questions. Pt educated on medications.  R: [name] safety ensured with 15 minute and environmental checks. Pt currently denies SI, HI, and AVH. Pt verbally contracts to seek staff if SI,HI, or AVH occurs and to consult with staff before acting on any harmful thoughts. Will continue to monitor.

## 2018-11-18 NOTE — Progress Notes (Signed)
Recreation Therapy Notes  Date: 12.20.19 Time: 0930 Location: 300 Hall Dayroom  Group Topic: Stress Management  Goal Area(s) Addresses:  Patient will verbalize importance of using healthy stress management.  Patient will identify positive emotions associated with healthy stress management.   Behavioral Response: Engaged  Intervention: Stress Management  Activity :  Guided Imagery.  LRT introduced the stress management technique of guided imagery.  LRT read a script on sitting and observing the starry sky at night.  Patients were to follow along as script as read.  Education:  Stress Management, Discharge Planning.   Education Outcome: Acknowledges edcuation/In group clarification offered/Needs additional education  Clinical Observations/Feedback: Pt attended and participated in group.     Victorino Sparrow, LRT/CTRS         Victorino Sparrow A 11/18/2018 11:46 AM

## 2018-11-18 NOTE — Progress Notes (Signed)
  [facility] Adult Case Management Discharge Plan :  Will you be returning to the same living situation after discharge:  Yes,  home At discharge, do you have transportation home?: Yes,  fiance will pick her up. Do you have the ability to pay for your medications: Yes,  medicaid  Release of information consent forms completed and submitted to medical records by CSW.   Patient to Follow up at: Follow-up Information    [facility]. Go on 11/21/2018.   Specialty:  Behavioral Health Why:  Y[facility] hospital follow up appointment is Monday, 11/21/18 at 8:00a.  Contact information: [place] [address] [phone]        [facility] Outpatient Follow up.   Why:  If you move to [facility], Please walk in for assessment at [facility] Monday-Friday between 9am-11am. They can assess you for medication management and therapy services. Thank you.  Contact information: [address] [place], [place] [address] Phone: [phone] Fax: [phone]          Next level of care provider has access to [place] and Suicide Prevention discussed: Yes,  SPE completed with pt and her fiance. SPI pamphlet and mobile crisis information provided to pt.   Have you used any form of tobacco in the last 30 days? (Cigarettes, Smokeless Tobacco, Cigars, and/or Pipes): Yes  Has patient been referred to the Quitline?: Patient refused referral  Patient has been referred for addiction treatment: Yes  Avelina Laine, LCSW 11/18/2018, 9:03 AM

## 2018-12-20 ENCOUNTER — Emergency Department (HOSPITAL_COMMUNITY): Payer: Medicaid Other

## 2018-12-20 ENCOUNTER — Other Ambulatory Visit: Payer: Self-pay

## 2018-12-20 ENCOUNTER — Encounter (HOSPITAL_COMMUNITY): Payer: Self-pay | Admitting: Emergency Medicine

## 2018-12-20 ENCOUNTER — Emergency Department (HOSPITAL_COMMUNITY)
Admission: EM | Admit: 2018-12-20 | Discharge: 2018-12-20 | Disposition: A | Discharge status: Home / Self Care | Payer: Medicaid Other | Attending: Emergency Medicine | Admitting: Emergency Medicine

## 2018-12-20 DIAGNOSIS — I10 Essential (primary) hypertension: Secondary | ICD-10-CM | POA: Insufficient documentation

## 2018-12-20 DIAGNOSIS — M25572 Pain in left ankle and joints of left foot: Secondary | ICD-10-CM

## 2018-12-20 DIAGNOSIS — Z79899 Other long term (current) drug therapy: Secondary | ICD-10-CM | POA: Insufficient documentation

## 2018-12-20 DIAGNOSIS — N760 Acute vaginitis: Secondary | ICD-10-CM | POA: Insufficient documentation

## 2018-12-20 DIAGNOSIS — F17210 Nicotine dependence, cigarettes, uncomplicated: Secondary | ICD-10-CM | POA: Insufficient documentation

## 2018-12-20 DIAGNOSIS — Z113 Encounter for screening for infections with a predominantly sexual mode of transmission: Secondary | ICD-10-CM | POA: Diagnosis not present

## 2018-12-20 DIAGNOSIS — Z7689 Persons encountering health services in other specified circumstances: Secondary | ICD-10-CM

## 2018-12-20 DIAGNOSIS — B9689 Other specified bacterial agents as the cause of diseases classified elsewhere: Secondary | ICD-10-CM

## 2018-12-20 LAB — WET PREP, GENITAL
Sperm: NONE SEEN
Trich, Wet Prep: NONE SEEN
Yeast Wet Prep HPF POC: NONE SEEN

## 2018-12-20 MED ORDER — AZITHROMYCIN 250 MG PO TABS
1000.0000 mg | ORAL_TABLET | Freq: Once | ORAL | Status: AC
  Administered 2018-12-20: 1000 mg via ORAL
  Filled 2018-12-20: qty 4

## 2018-12-20 MED ORDER — LIDOCAINE HCL (PF) 1 % IJ SOLN
2.0000 mL | Freq: Once | INTRAMUSCULAR | Status: AC
  Administered 2018-12-20: 2 mL
  Filled 2018-12-20: qty 30

## 2018-12-20 MED ORDER — METRONIDAZOLE 500 MG PO TABS: 500.0000 mg | ORAL_TABLET | Freq: Two times a day (BID) | ORAL | 0 refills | Status: DC

## 2018-12-20 MED ORDER — CEFTRIAXONE SODIUM 250 MG IJ SOLR
250.0000 mg | Freq: Once | INTRAMUSCULAR | Status: AC
  Administered 2018-12-20: 250 mg via INTRAMUSCULAR
  Filled 2018-12-20: qty 250

## 2018-12-20 NOTE — Discharge Instructions (Addendum)
Evaluated today for ankle pain. Xray negative. Wet prep positive for bacterial vaginosis. I have given you Flagyl. Please do not drink alcohol while taking this medication.

## 2018-12-20 NOTE — ED Provider Notes (Signed)
[facility] DEPT Provider Note   CSN: [id number] Arrival date & time: 12/20/18  1221  History   Chief Complaint Chief Complaint  Patient presents with  . Ankle Pain  . Vaginal Pain    HPI Natasha Long is a 23 y.o. female with past medical history significant for bipolar disorder, hypertension, PTSD presents for evaluation of ankle pain as well as vaginal discharge.  Patient states she was getting off of the bus and inverted her left ankle.  Patient states she has had pain and swelling to this area since incident.  Patient states she has been able to ambulate, however states it is painful.  Rates her pain a 4/10.  Pain does not radiate.  Has not take anything for symptoms PTA.  Patient states she also had a new sexual partner approximately 2 weeks ago.  Patient states he did not use protection.  Patient states she has had yellow vaginal discharge since her sexual encounter.  Patient admits to prior history of gonorrhea and chlamydia.  Patient states she is also had pain to her labia when she wipes.  Denies previous history of herpes.  No fever, chills, nausea, vomiting, chest pain, shortness of breath, abdominal pain, pelvic pain, dysuria, diarrhea, constipation.  Denies additional aggravating or alleviating factors.  History provided by patient.  No interpreter was used.  HPI  Past Medical History:  Diagnosis Date  . Bipolar affective, manic ([place])   . Hypertension    diagnosed as child; stopped meds at 76 yo  . Hypoglycemia   . Insomnia   . Nephrotic syndrome   . Nephrotic syndrome   . PTSD (post-traumatic stress disorder)   . Renal disease     Patient Active Problem List   Diagnosis Date Noted  . Borderline personality disorder ([place]) 11/16/2018  . Moderate cannabis use disorder ([place]) 11/16/2018  . Severe recurrent major depression without psychotic features ([place]) 11/15/2018  . 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 Chronic  hypertension 04/04/2018  . Nephrotic syndrome 04/01/2018  . Supervision of high risk pregnancy, antepartum 04/01/2018  . Rh negative status during pregnancy 04/01/2018  . PTSD (post-traumatic stress disorder) 04/01/2018  . Bipolar affective disorder, depressed, mild ([place]) 04/09/2017  . Cocaine abuse ([place]) 04/09/2017    Past Surgical History:  Procedure Laterality Date  . CHOLECYSTECTOMY    . extraction of wisdom teeth    . RENAL BIOPSY       OB History    Gravida  2   Para  1   Term  1   Preterm      AB      Living  1     SAB      TAB      Ectopic      Multiple      Live Births  1            Home Medications    Prior to Admission medications   Medication Sig Start Date End Date Taking? Authorizing Provider  ARIPiprazole (ABILIFY) 15 MG tablet Take 1 tablet (15 mg total) by mouth daily. 11/19/18   Connye Burkitt, NP  gabapentin (NEURONTIN) 800 MG tablet Take 1 tablet (800 mg total) by mouth 3 (three) times daily. 11/18/18   Connye Burkitt, NP  hydrOXYzine (ATARAX/VISTARIL) 25 MG tablet Take 1 tablet (25 mg total) by mouth 3 (three) times daily as needed for anxiety. 11/18/18   Connye Burkitt, NP  metroNIDAZOLE (FLAGYL) 500 MG  tablet Take 1 tablet (500 mg total) by mouth 2 (two) times daily. 12/20/18   Henderly, Britni A, PA-C  nicotine (NICODERM CQ - DOSED IN MG/24 HOURS) 21 mg/24hr patch Place 1 patch (21 mg total) onto the skin daily. (May buy over the counter) 11/19/18   Connye Burkitt, NP  traZODone (DESYREL) 100 MG tablet Take 1 tablet (100 mg total) by mouth at bedtime as needed for sleep. 11/18/18   Connye Burkitt, NP    Family History Family History  Problem Relation Age of Onset  . Diabetes Other   . Hypertension Other     Social History Social History   Tobacco Use  . Smoking status: Light Tobacco Smoker    Types: Cigarettes  . Smokeless tobacco: Never Used  Substance Use Topics  . Alcohol use: No  . Drug use: Yes    Types: Marijuana,  Cocaine     Allergies   Prednisone; Prozac [fluoxetine hcl]; and Wellbutrin [bupropion]   Review of Systems Review of Systems  Constitutional: Negative.   HENT: Negative.   Respiratory: Negative.   Cardiovascular: Negative.   Gastrointestinal: Negative.   Genitourinary: Positive for vaginal discharge. Negative for decreased urine volume, difficulty urinating, dyspareunia, dysuria, flank pain, frequency, genital sores, menstrual problem, pelvic pain, urgency, vaginal bleeding and vaginal pain.  Musculoskeletal:       Left ankle pain.  Skin: Negative.   Neurological: Negative.   All other systems reviewed and are negative.    Physical Exam Updated Vital Signs BP 138/80   Pulse 71   Temp 98 F (36.7 C) (Oral)   Resp 18   Ht 5\' 8"  (1.727 m)   Wt 136.1 kg   LMP 11/19/2018 (Approximate)   SpO2 96%   BMI 45.61 kg/m   Physical Exam Vitals signs and nursing note reviewed. Exam conducted with a chaperone present.  Constitutional:      General: She is not in acute distress.    Appearance: She is well-developed. She is obese. She is not ill-appearing, toxic-appearing or diaphoretic.  HENT:     Head: Normocephalic and atraumatic.     Nose: Nose normal.     Mouth/Throat:     Mouth: Mucous membranes are moist.     Pharynx: Oropharynx is clear.  Eyes:     Pupils: Pupils are equal, round, and reactive to light.  Neck:     Musculoskeletal: Normal range of motion.  Cardiovascular:     Rate and Rhythm: Normal rate.     Pulses: Normal pulses.     Heart sounds: Normal heart sounds. No murmur. No friction rub. No gallop.   Pulmonary:     Effort: Pulmonary effort is normal. No respiratory distress.     Breath sounds: Normal breath sounds. No stridor. No wheezing, rhonchi or rales.  Abdominal:     General: Bowel sounds are normal. There is no distension.     Palpations: Abdomen is soft.     Tenderness: There is no abdominal tenderness.  Genitourinary:    Comments: Normal  appearing external female genitalia without rashes or lesions, normal vaginal epithelium. Normal appearing cervix without petechiae. White thick discharge in vaginal vault. Cervical os is closed. There is no bleeding noted at the os.No Odor. Bimanual: No CMT, nontender.  No palpable adnexal masses or tenderness. Uterus midline and not fixed. Rectovaginal exam was deferred.  No cystocele or rectocele noted. No pelvic lymphadenopathy noted. Wet prep was obtained.  Cultures for gonorrhea and chlamydia collected. Exam  performed with chaperone in room. Musculoskeletal: Normal range of motion.     Comments: Tenderness to palpation to lateral malleolus on left ankle.  Able to plantarflex, dorsiflex without difficulty.  Patient unable to invert secondary to pain.  No tenderness palpation to proximal tibia or fibula.  Skin:    General: Skin is warm and dry.     Comments: Mild edema to left ankle.  No ecchymosis, warmth, erythema, contusions or abrasions.  Neurological:     Mental Status: She is alert.     Comments: Intact sensation to bilateral lower extremity.  5/5 strength with lower extremity.      ED Treatments / Results  Labs (all labs ordered are listed, but only abnormal results are displayed) Labs Reviewed  WET PREP, GENITAL - Abnormal; Notable for the following components:      Result Value   Clue Cells Wet Prep HPF POC PRESENT (*)    WBC, Wet Prep HPF POC FEW (*)    All other components within normal limits  GC/CHLAMYDIA PROBE AMP ([place]) NOT AT [facility]    EKG None  Radiology Dg Ankle Complete Left  Result Date: 12/20/2018 CLINICAL DATA:  Ankle pain following inversion injury, re-injured LEFT ankle today stepping down on uneven surface, rolled ankle and now has swelling and pain EXAM: LEFT ANKLE COMPLETE - 3+ VIEW COMPARISON:  07/11/2017 FINDINGS: Osseous mineralization normal. Joint spaces preserved. No acute fracture, dislocation, or bone destruction. IMPRESSION: No acute osseous  abnormalities. Electronically Signed   By: Lavonia Dana M.D.   On: 12/20/2018 14:36    Procedures Procedures (including critical care time)  Medications Ordered in ED Medications  cefTRIAXone (ROCEPHIN) injection 250 mg (250 mg Intramuscular Given 12/20/18 1534)  azithromycin (ZITHROMAX) tablet 1,000 mg (1,000 mg Oral Given 12/20/18 1533)  lidocaine (PF) (XYLOCAINE) 1 % injection 2 mL (2 mLs Other Given 12/20/18 1534)     Initial Impression / Assessment and Plan / ED Course  I have reviewed the triage vital signs and the nursing notes.  Pertinent labs & imaging results that were available during my care of the patient were reviewed by me and considered in my medical decision making (see chart for details).  23 year old female who appears otherwise well presents for evaluation of vaginal discharge and left ankle pain.  Afebrile, nonseptic, non-ill-appearing.  Patient with yellow vaginal discharge after new sexual partner.  Does not use protection.  Will obtain GC, chlamydia, wet prep.  Patient declines HIV and syphilis testing at this time.  Patient also with left ankle pain after stepping off of a curb.  She has tenderness and swelling to lateral malleolus on left ankle.  Able to plantarflex and dorsiflex without difficulty.  Neurovascularly intact.  2+ DP, PT pulses.  Lower extremity compartments are soft.  No evidence of compartment syndrome.  Will obtain x-ray and reevaluate.  Xray negative for fracture or dislocation. Likely sprain or strain. Will place in ASO brace and have follow up[ with PCP if continued symptoms. Patient able to ambulate in department without difficulty. GU exam without cervical motion tenderness. Patient understands that they have GC/Chlamydia cultures pending. Declined HIV/RPR. Patient treated with empiric Abx for gc/chlamydia.   Wet prep with clue cells. Will dc home with Flagyl.  Patient instructed to not drink Patient to be discharged with instructions to follow up  with OBGYN/PCP. Discussed importance of using protection when sexually active.  Hemodynamically stable and appropriate for DC home at this time.  Discussed return precautions.  Patient was voices understanding and is agreeable for follow-up.  Low suspicion for emergent pathology causing patient's symptoms at this time.    Final Clinical Impressions(s) / ED Diagnoses   Final diagnoses:  Acute left ankle pain  Encounter for assessment of STD exposure  Bacterial vaginosis    ED Discharge Orders         Ordered    metroNIDAZOLE (FLAGYL) 500 MG tablet  2 times daily     12/20/18 1539           Henderly, Britni A, PA-C 12/20/18 1545    Drenda Freeze, MD 12/20/18 1555

## 2018-12-20 NOTE — ED Notes (Signed)
Patient left before discharge process could be completed.  

## 2018-12-20 NOTE — ED Triage Notes (Signed)
Patient has noticed vaginal discharge and vaginal burning with wiping since she had unprotected sex 12/12/2018. She would like to be checked for STDs. Also she re-injured her left ankle today by stepping down on an uneven surface. She states her ankle rolled and now has swelling.  EMS vitals: 142/80 BP 90 HR 20 resp rate

## 2018-12-22 LAB — GC/CHLAMYDIA PROBE AMP (~~LOC~~) NOT AT ARMC
Chlamydia: NEGATIVE
Neisseria Gonorrhea: POSITIVE — AB

## 2019-01-12 ENCOUNTER — Encounter (HOSPITAL_COMMUNITY): Payer: Self-pay | Admitting: Emergency Medicine

## 2019-01-12 ENCOUNTER — Emergency Department (HOSPITAL_COMMUNITY)
Admission: EM | Admit: 2019-01-12 | Discharge: 2019-01-13 | Disposition: A | Discharge status: Other Acute Inpatient Hospital | Payer: Medicaid Other | Attending: Emergency Medicine | Admitting: Emergency Medicine

## 2019-01-12 DIAGNOSIS — I10 Essential (primary) hypertension: Secondary | ICD-10-CM | POA: Diagnosis not present

## 2019-01-12 DIAGNOSIS — F329 Major depressive disorder, single episode, unspecified: Secondary | ICD-10-CM | POA: Diagnosis present

## 2019-01-12 DIAGNOSIS — F17210 Nicotine dependence, cigarettes, uncomplicated: Secondary | ICD-10-CM | POA: Insufficient documentation

## 2019-01-12 DIAGNOSIS — Z79899 Other long term (current) drug therapy: Secondary | ICD-10-CM | POA: Diagnosis not present

## 2019-01-12 DIAGNOSIS — Z7251 High risk heterosexual behavior: Secondary | ICD-10-CM | POA: Diagnosis not present

## 2019-01-12 DIAGNOSIS — F411 Generalized anxiety disorder: Secondary | ICD-10-CM | POA: Diagnosis not present

## 2019-01-12 DIAGNOSIS — F1220 Cannabis dependence, uncomplicated: Secondary | ICD-10-CM | POA: Insufficient documentation

## 2019-01-12 DIAGNOSIS — F332 Major depressive disorder, recurrent severe without psychotic features: Secondary | ICD-10-CM | POA: Diagnosis not present

## 2019-01-12 DIAGNOSIS — R45851 Suicidal ideations: Secondary | ICD-10-CM

## 2019-01-12 DIAGNOSIS — F1920 Other psychoactive substance dependence, uncomplicated: Secondary | ICD-10-CM

## 2019-01-12 LAB — COMPREHENSIVE METABOLIC PANEL
ALT: 30 U/L (ref 0–44)
AST: 28 U/L (ref 15–41)
Albumin: 3.5 g/dL (ref 3.5–5.0)
Alkaline Phosphatase: 51 U/L (ref 38–126)
Anion gap: 7 (ref 5–15)
BUN: 16 mg/dL (ref 6–20)
CO2: 26 mmol/L (ref 22–32)
Calcium: 8.9 mg/dL (ref 8.9–10.3)
Chloride: 105 mmol/L (ref 98–111)
Creatinine, Ser: 1.16 mg/dL — ABNORMAL HIGH (ref 0.44–1.00)
GFR calc Af Amer: >60 mL/min (ref >60)
GFR calc non Af Amer: >60 mL/min (ref >60)
Glucose, Bld: 100 mg/dL — ABNORMAL HIGH (ref 70–99)
Potassium: 3.9 mmol/L (ref 3.5–5.1)
SODIUM: 138 mmol/L (ref 135–145)
Total Bilirubin: 0.4 mg/dL (ref 0.3–1.2)
Total Protein: 7.2 g/dL (ref 6.5–8.1)

## 2019-01-12 LAB — CBC
HCT: 47.6 % — ABNORMAL HIGH (ref 36.0–46.0)
Hemoglobin: 15.6 g/dL — ABNORMAL HIGH (ref 12.0–15.0)
MCH: 30.6 pg (ref 26.0–34.0)
MCHC: 32.8 g/dL (ref 30.0–36.0)
MCV: 93.3 fL (ref 80.0–100.0)
PLATELETS: 268 10*3/uL (ref 150–400)
RBC: 5.1 MIL/uL (ref 3.87–5.11)
RDW: 13.2 % (ref 11.5–15.5)
WBC: 10.5 10*3/uL (ref 4.0–10.5)
nRBC: 0 % (ref 0.0–0.2)

## 2019-01-12 LAB — WET PREP, GENITAL
Sperm: NONE SEEN
TRICH WET PREP: NONE SEEN
WBC WET PREP: NONE SEEN
Yeast Wet Prep HPF POC: NONE SEEN

## 2019-01-12 LAB — ACETAMINOPHEN LEVEL: Acetaminophen (Tylenol), Serum: <10 ug/mL — ABNORMAL LOW (ref 10–30)

## 2019-01-12 LAB — I-STAT BETA HCG BLOOD, ED (MC, WL, AP ONLY): I-stat hCG, quantitative: <5 m[IU]/mL (ref <5)

## 2019-01-12 LAB — ETHANOL: Alcohol, Ethyl (B): <10 mg/dL (ref <10)

## 2019-01-12 LAB — SALICYLATE LEVEL

## 2019-01-12 MED ORDER — HYDROXYZINE HCL 25 MG PO TABS
25.0000 mg | ORAL_TABLET | Freq: Three times a day (TID) | ORAL | Status: DC | PRN
  Administered 2019-01-12: 25 mg via ORAL
  Filled 2019-01-12: qty 1

## 2019-01-12 MED ORDER — AZITHROMYCIN 250 MG PO TABS
1000.0000 mg | ORAL_TABLET | Freq: Once | ORAL | Status: AC
  Administered 2019-01-13: 1000 mg via ORAL
  Filled 2019-01-12: qty 4

## 2019-01-12 MED ORDER — NICOTINE 21 MG/24HR TD PT24
21.0000 mg | MEDICATED_PATCH | Freq: Every day | TRANSDERMAL | Status: DC
  Administered 2019-01-12: 21 mg via TRANSDERMAL
  Filled 2019-01-12: qty 1

## 2019-01-12 MED ORDER — CEFTRIAXONE SODIUM 250 MG IJ SOLR
250.0000 mg | Freq: Once | INTRAMUSCULAR | Status: AC
  Administered 2019-01-13: 250 mg via INTRAMUSCULAR
  Filled 2019-01-12: qty 250

## 2019-01-12 MED ORDER — STERILE WATER FOR INJECTION IJ SOLN
INTRAMUSCULAR | Status: AC
  Administered 2019-01-13: 0.9 mL
  Filled 2019-01-12: qty 10

## 2019-01-12 MED ORDER — ARIPIPRAZOLE 5 MG PO TABS: 15.0000 mg | ORAL_TABLET | Freq: Every day | ORAL | Status: DC

## 2019-01-12 MED ORDER — GABAPENTIN 400 MG PO CAPS
800.0000 mg | ORAL_CAPSULE | Freq: Three times a day (TID) | ORAL | Status: DC
  Administered 2019-01-12: 800 mg via ORAL
  Filled 2019-01-12: qty 2

## 2019-01-12 MED ORDER — TRAZODONE HCL 100 MG PO TABS: 100.0000 mg | ORAL_TABLET | Freq: Every evening | ORAL | Status: DC | PRN

## 2019-01-12 NOTE — BHH Counselor (Signed)
Per Natasha Pacini, RN pt has been accepted to [facility] and assigned to Natasha Long. Pt can come after midnight. Attending physician: Natasha Long. Admitting physician: Natasha Clan, PA. Updated disposition discussed with Alyssa, PA and Natasha Long, Therapist, sports.    Vertell Novak, [place], [facility], [facility] Triage Specialist [phone]
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
## 2019-01-12 NOTE — ED Triage Notes (Signed)
Patient here from home with complaints of suicidal ideation. Plan to OD or cut self. Hx of same. Also reports that she has been using "Molly".

## 2019-01-12 NOTE — ED Notes (Signed)
Pt returned to room 40

## 2019-01-12 NOTE — ED Notes (Signed)
Pt alert and oriented. Pt denies any pain at this time. Pt cooperative. Pt c/o of si and contracts to safety. Pt dneis any hi or avh. Pt safe will continue to monitor.

## 2019-01-12 NOTE — ED Notes (Signed)
Pt escorted to tcu bed 30 for a pelvic exam.

## 2019-01-12 NOTE — ED Notes (Signed)
Bed: [facility]PT3 Expected date:  Expected time:  Means of arrival:  Comments: 

## 2019-01-12 NOTE — ED Provider Notes (Signed)
[facility] DEPT Provider Note   CSN: [id number] Arrival date & time: 01/12/19  1628     History   Chief Complaint Chief Complaint  Patient presents with  . Suicidal  . Drug Problem    HPI Natasha Long is a 23 y.o. female.  HPI  Patient is a 23 year old female with a history of bipolar affective disorder, PTSD, nephrotic syndrome presenting for suicidal ideations.  She reports that she went through a "bad break-up" 3 weeks ago and has been using [place] ever since and engaging in high risk sexual activity.  She reports 3 partners with unprotected sexual activity in the past 3 weeks.  Patient reports that she has thought about taking her life by taking her pills.  She denies any attempts.  Patient reports that she is currently living in a shelter and has no family support here in [place].  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  She denies any HI or AVH.  Past Medical History:  Diagnosis Date  . Bipolar affective, manic ([place])   . Hypertension    diagnosed as child; stopped meds at 81 yo  . Hypoglycemia   . Insomnia   . Nephrotic syndrome   . Nephrotic syndrome   . PTSD (post-traumatic stress disorder)   . Renal disease     Patient Active Problem List   Diagnosis Date Noted  . Borderline personality disorder ([place]) 11/16/2018  . Moderate cannabis use disorder ([place]) 11/16/2018  . Severe recurrent major depression without psychotic features ([place]) 11/15/2018  . 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 Chronic hypertension 04/04/2018  . Nephrotic syndrome 04/01/2018  . Supervision of high risk pregnancy, antepartum 04/01/2018  . Rh negative status during pregnancy 04/01/2018  . PTSD (post-traumatic stress disorder) 04/01/2018  . Bipolar affective disorder, depressed, mild ([place]) 04/09/2017  . Cocaine abuse ([place]) 04/09/2017    Past Surgical History:  Procedure Laterality Date  . CHOLECYSTECTOMY    . extraction  of wisdom teeth    . RENAL BIOPSY       OB History    Gravida  2   Para  1   Term  1   Preterm      AB      Living  1     SAB      TAB      Ectopic      Multiple      Live Births  1            Home Medications    Prior to Admission medications   Medication Sig Start Date End Date Taking? Authorizing Provider  ARIPiprazole (ABILIFY) 15 MG tablet Take 1 tablet (15 mg total) by mouth daily. 11/19/18   Connye Burkitt, NP  gabapentin (NEURONTIN) 800 MG tablet Take 1 tablet (800 mg total) by mouth 3 (three) times daily. 11/18/18   Connye Burkitt, NP  hydrOXYzine (ATARAX/VISTARIL) 25 MG tablet Take 1 tablet (25 mg total) by mouth 3 (three) times daily as needed for anxiety. 11/18/18   Connye Burkitt, NP  metroNIDAZOLE (FLAGYL) 500 MG tablet Take 1 tablet (500 mg total) by mouth 2 (two) times daily. 12/20/18   Henderly, Britni A, PA-C  nicotine (NICODERM CQ - DOSED IN MG/24 HOURS) 21 mg/24hr patch Place 1 patch (21 mg total) onto the skin daily. (May buy over the counter) 11/19/18   Connye Burkitt, NP  traZODone (DESYREL) 100 MG tablet Take 1 tablet (  100 mg total) by mouth at bedtime as needed for sleep. 11/18/18   Connye Burkitt, NP    Family History Family History  Problem Relation Age of Onset  . Diabetes Other   . Hypertension Other     Social History Social History   Tobacco Use  . Smoking status: Light Tobacco Smoker    Types: Cigarettes  . Smokeless tobacco: Never Used  Substance Use Topics  . Alcohol use: No  . Drug use: Yes    Types: Marijuana, Cocaine     Allergies   Prednisone; Prozac [fluoxetine hcl]; and Wellbutrin [bupropion]   Review of Systems Review of Systems  Psychiatric/Behavioral: Positive for dysphoric mood and suicidal ideas. Negative for behavioral problems and hallucinations. The patient is not nervous/anxious and is not hyperactive.   All other systems reviewed and are negative.    Physical Exam Updated Vital Signs BP  126/64 (BP Location: Left Arm)   Pulse 66   Temp 98.9 F (37.2 C) (Oral)   Resp 18   Ht 5\' 8"  (1.727 m)   Wt 122.5 kg   LMP 01/28/2018 (Exact Date)   SpO2 98%   BMI 41.05 kg/m   Physical Exam Vitals signs and nursing note reviewed.  Constitutional:      General: She is not in acute distress.    Appearance: She is well-developed.  HENT:     Head: Normocephalic and atraumatic.  Eyes:     Conjunctiva/sclera: Conjunctivae normal.     Pupils: Pupils are equal, round, and reactive to light.  Neck:     Musculoskeletal: Normal range of motion and neck supple.  Cardiovascular:     Rate and Rhythm: Normal rate and regular rhythm.     Heart sounds: S1 normal and S2 normal. No murmur.  Pulmonary:     Effort: Pulmonary effort is normal.     Breath sounds: Normal breath sounds. No wheezing or rales.  Abdominal:     General: There is no distension.     Palpations: Abdomen is soft.     Tenderness: There is no abdominal tenderness. There is no guarding.  Genitourinary:    Comments: Pelvic exam performed with nurse tech chaperone present.  Patient has no inguinal lymphadenopathy.  No external lesions of the vagina or perineum.  Vaginal tissue pink and rugated.  Cervix nonerythematous and nonfriable.  Small amount of milky white discharge present. No CMT or adnexal tenderness. Musculoskeletal: Normal range of motion.        General: No deformity.  Lymphadenopathy:     Cervical: No cervical adenopathy.  Skin:    General: Skin is warm and dry.     Findings: No erythema or rash.  Neurological:     Mental Status: She is alert.     Comments: Cranial nerves grossly intact. Patient moves extremities symmetrically and with good coordination.  Psychiatric:     Comments: Alert and oriented x4.  Flattened affect.  Patient expressing suicidal ideations on exam.      ED Treatments / Results  Labs (all labs ordered are listed, but only abnormal results are displayed) Labs Reviewed  WET PREP,  GENITAL - Abnormal; Notable for the following components:      Result Value   Clue Cells Wet Prep HPF POC PRESENT (*)    All other components within normal limits  COMPREHENSIVE METABOLIC PANEL - Abnormal; Notable for the following components:   Glucose, Bld 100 (*)    Creatinine, Ser 1.16 (*)  All other components within normal limits  ACETAMINOPHEN LEVEL - Abnormal; Notable for the following components:   Acetaminophen (Tylenol), Serum <10 (*)    All other components within normal limits  CBC - Abnormal; Notable for the following components:   Hemoglobin 15.6 (*)    HCT 47.6 (*)    All other components within normal limits  ETHANOL  SALICYLATE LEVEL  RAPID URINE DRUG SCREEN, HOSP PERFORMED  URINALYSIS, ROUTINE W REFLEX MICROSCOPIC  RPR  HIV ANTIBODY (ROUTINE TESTING W REFLEX)  I-STAT BETA HCG BLOOD, ED ([facility], [facility], [facility] ONLY)  GC/CHLAMYDIA PROBE AMP ([place]) NOT AT [facility]    EKG None  Radiology No results found.  Procedures Procedures (including critical care time)  Medications Ordered in ED Medications  nicotine (NICODERM CQ - dosed in mg/24 hours) patch 21 mg (21 mg Transdermal Patch Applied 01/12/19 2011)  ARIPiprazole (ABILIFY) tablet 15 mg (has no administration in time range)  gabapentin (NEURONTIN) capsule 800 mg (800 mg Oral Given 01/12/19 2116)  hydrOXYzine (ATARAX/VISTARIL) tablet 25 mg (25 mg Oral Given 01/12/19 2011)  traZODone (DESYREL) tablet 100 mg (has no administration in time range)     Initial Impression / Assessment and Plan / ED Course  I have reviewed the triage vital signs and the nursing notes.  Pertinent labs & imaging results that were available during my care of the patient were reviewed by me and considered in my medical decision making (see chart for details).  Clinical Course as of Jan 12 2147  Thu Jan 12, 2019  2146 Received call from TTS consulted who states that patient meets inpatient criteria.  She is medically cleared from her ED  evaluation.   [AM]  2148 Will treat. Do not see need for empiric GC/CT treatment based on wet prep.   Clue Cells Wet Prep HPF POC(!): PRESENT [AM]    Clinical Course User Index [AM] Albesa Seen, PA-C    Patient well-appearing and in no acute distress.  Patient with a flattened affect and expressing suicidal ideations in the setting of recent break-up.  She has a plan to overdose with pills.  Patient is medically cleared from her ED evaluation.  She is also concerned about high risk sexual activity.  Wet prep was completed.  Patient has clue cells but no WBCs.  Feel that this is low risk for STI and GC and chlamydia can be followed.  Patient accepted at [facility]. Can be transferred after HIV and RPR are collected.   Final Clinical Impressions(s) / ED Diagnoses   Final diagnoses:  Suicidal ideation  High risk heterosexual behavior  Polysubstance (excluding opioids) dependence, daily use [facility])    ED Discharge Orders    None       Tamala Julian 01/12/19 2208    Charlesetta Shanks, MD 01/13/19 1302

## 2019-01-12 NOTE — ED Notes (Signed)
Patient sitting in room eating dinner.  Calm and cooperative.  Endorses suicidal ideation to "take a bunch of her pills."  Denies HI, or AVH. 10min.checks and video monitoring in place.

## 2019-01-12 NOTE — BHH Counselor (Signed)
Pt gave consent to contact her caseworker Link Snuffer at [employer] ([place]) [phone]. Clinician called and left a HIPPA compliant voice message.    Vertell Novak, [place], [facility], [facility] Triage Specialist [phone]

## 2019-01-13 ENCOUNTER — Inpatient Hospital Stay (HOSPITAL_COMMUNITY)
Admission: AD | Admit: 2019-01-13 | Discharge: 2019-01-16 | DRG: 885 | Disposition: A | Discharge status: Home / Self Care | Payer: Medicaid Other | Source: Intra-hospital | Attending: Emergency Medicine | Admitting: Emergency Medicine

## 2019-01-13 ENCOUNTER — Inpatient Hospital Stay (HOSPITAL_COMMUNITY): Payer: Medicaid Other

## 2019-01-13 ENCOUNTER — Encounter (HOSPITAL_COMMUNITY): Payer: Self-pay

## 2019-01-13 ENCOUNTER — Other Ambulatory Visit: Payer: Self-pay

## 2019-01-13 DIAGNOSIS — S62357A Nondisplaced fracture of shaft of fifth metacarpal bone, left hand, initial encounter for closed fracture: Secondary | ICD-10-CM

## 2019-01-13 DIAGNOSIS — F1220 Cannabis dependence, uncomplicated: Secondary | ICD-10-CM | POA: Diagnosis present

## 2019-01-13 DIAGNOSIS — M25572 Pain in left ankle and joints of left foot: Secondary | ICD-10-CM

## 2019-01-13 DIAGNOSIS — Z915 Personal history of self-harm: Secondary | ICD-10-CM | POA: Diagnosis not present

## 2019-01-13 DIAGNOSIS — R45851 Suicidal ideations: Secondary | ICD-10-CM | POA: Diagnosis present

## 2019-01-13 DIAGNOSIS — F322 Major depressive disorder, single episode, severe without psychotic features: Secondary | ICD-10-CM | POA: Diagnosis not present

## 2019-01-13 DIAGNOSIS — F603 Borderline personality disorder: Secondary | ICD-10-CM | POA: Diagnosis present

## 2019-01-13 DIAGNOSIS — T1490XA Injury, unspecified, initial encounter: Secondary | ICD-10-CM

## 2019-01-13 DIAGNOSIS — Z590 Homelessness: Secondary | ICD-10-CM

## 2019-01-13 DIAGNOSIS — F4310 Post-traumatic stress disorder, unspecified: Secondary | ICD-10-CM | POA: Diagnosis present

## 2019-01-13 DIAGNOSIS — F332 Major depressive disorder, recurrent severe without psychotic features: Secondary | ICD-10-CM | POA: Diagnosis present

## 2019-01-13 DIAGNOSIS — Z888 Allergy status to other drugs, medicaments and biological substances status: Secondary | ICD-10-CM | POA: Diagnosis not present

## 2019-01-13 DIAGNOSIS — G4700 Insomnia, unspecified: Secondary | ICD-10-CM | POA: Diagnosis present

## 2019-01-13 LAB — URINALYSIS, ROUTINE W REFLEX MICROSCOPIC
BACTERIA UA: NONE SEEN
Bilirubin Urine: NEGATIVE
Glucose, UA: NEGATIVE mg/dL
Hgb urine dipstick: NEGATIVE
Ketones, ur: NEGATIVE mg/dL
Leukocytes,Ua: NEGATIVE
NITRITE: NEGATIVE
PH: 5 (ref 5.0–8.0)
Protein, ur: >=300 mg/dL — AB
SPECIFIC GRAVITY, URINE: 1.025 (ref 1.005–1.030)

## 2019-01-13 LAB — GC/CHLAMYDIA PROBE AMP (~~LOC~~) NOT AT ARMC
CHLAMYDIA, DNA PROBE: NEGATIVE
NEISSERIA GONORRHEA: NEGATIVE

## 2019-01-13 LAB — HIV ANTIBODY (ROUTINE TESTING W REFLEX): HIV Screen 4th Generation wRfx: NONREACTIVE

## 2019-01-13 LAB — RAPID URINE DRUG SCREEN, HOSP PERFORMED
Amphetamines: NOT DETECTED
Barbiturates: NOT DETECTED
Benzodiazepines: NOT DETECTED
Cocaine: POSITIVE — AB
OPIATES: NOT DETECTED
Tetrahydrocannabinol: POSITIVE — AB

## 2019-01-13 LAB — RPR: RPR Ser Ql: NONREACTIVE

## 2019-01-13 MED ORDER — NICOTINE 21 MG/24HR TD PT24
21.0000 mg | MEDICATED_PATCH | Freq: Every day | TRANSDERMAL | Status: DC
  Administered 2019-01-14 – 2019-01-16 (×3): 21 mg via TRANSDERMAL
  Filled 2019-01-13 (×6): qty 1

## 2019-01-13 MED ORDER — ZIPRASIDONE MESYLATE 20 MG IM SOLR
20.0000 mg | Freq: Once | INTRAMUSCULAR | Status: DC
  Filled 2019-01-13: qty 20

## 2019-01-13 MED ORDER — TRAZODONE HCL 100 MG PO TABS
100.0000 mg | ORAL_TABLET | Freq: Every evening | ORAL | Status: DC | PRN
  Administered 2019-01-13 – 2019-01-15 (×3): 100 mg via ORAL
  Filled 2019-01-13 (×2): qty 1

## 2019-01-13 MED ORDER — METRONIDAZOLE 500 MG PO TABS: 500.0000 mg | ORAL_TABLET | Freq: Two times a day (BID) | ORAL | Status: DC

## 2019-01-13 MED ORDER — ARIPIPRAZOLE 15 MG PO TABS
15.0000 mg | ORAL_TABLET | Freq: Every day | ORAL | Status: DC
  Administered 2019-01-13 – 2019-01-15 (×3): 15 mg via ORAL
  Filled 2019-01-13 (×6): qty 1

## 2019-01-13 MED ORDER — ZIPRASIDONE HCL 80 MG PO CAPS
80.0000 mg | ORAL_CAPSULE | Freq: Once | ORAL | Status: AC
  Filled 2019-01-13: qty 1

## 2019-01-13 MED ORDER — MAGNESIUM HYDROXIDE 400 MG/5ML PO SUSP: 30.0000 mL | Freq: Every day | ORAL | Status: DC | PRN

## 2019-01-13 MED ORDER — ARIPIPRAZOLE ER 400 MG IM SRER
400.0000 mg | Freq: Once | INTRAMUSCULAR | Status: AC
  Administered 2019-01-13: 400 mg via INTRAMUSCULAR

## 2019-01-13 MED ORDER — TRAZODONE HCL 50 MG PO TABS: 50.0000 mg | ORAL_TABLET | Freq: Every evening | ORAL | Status: DC | PRN

## 2019-01-13 MED ORDER — ALUM & MAG HYDROXIDE-SIMETH 200-200-20 MG/5ML PO SUSP: 30.0000 mL | ORAL | Status: DC | PRN

## 2019-01-13 MED ORDER — TRAZODONE HCL 50 MG PO TABS
50.0000 mg | ORAL_TABLET | Freq: Every evening | ORAL | Status: DC | PRN
  Administered 2019-01-13 (×2): 50 mg via ORAL
  Filled 2019-01-13 (×2): qty 1

## 2019-01-13 MED ORDER — ZIPRASIDONE MESYLATE 20 MG IM SOLR
INTRAMUSCULAR | Status: AC
  Filled 2019-01-13: qty 20

## 2019-01-13 MED ORDER — NICOTINE 21 MG/24HR TD PT24
MEDICATED_PATCH | TRANSDERMAL | Status: AC
  Administered 2019-01-13: 10:00:00
  Filled 2019-01-13: qty 1

## 2019-01-13 MED ORDER — HYDROXYZINE HCL 25 MG PO TABS
25.0000 mg | ORAL_TABLET | Freq: Four times a day (QID) | ORAL | Status: DC | PRN
  Administered 2019-01-14 – 2019-01-16 (×4): 25 mg via ORAL
  Filled 2019-01-13 (×3): qty 1

## 2019-01-13 MED ORDER — ACETAMINOPHEN 325 MG PO TABS
650.0000 mg | ORAL_TABLET | Freq: Four times a day (QID) | ORAL | Status: DC | PRN
  Administered 2019-01-13 – 2019-01-15 (×5): 650 mg via ORAL
  Filled 2019-01-13 (×4): qty 2

## 2019-01-13 MED ORDER — ZIPRASIDONE HCL 40 MG PO CAPS
ORAL_CAPSULE | ORAL | Status: AC
  Administered 2019-01-13: 80 mg
  Filled 2019-01-13: qty 2

## 2019-01-13 MED ORDER — GABAPENTIN 400 MG PO CAPS
800.0000 mg | ORAL_CAPSULE | Freq: Three times a day (TID) | ORAL | Status: DC
  Administered 2019-01-13 – 2019-01-15 (×8): 800 mg via ORAL
  Filled 2019-01-13 (×15): qty 2
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
## 2019-01-13 NOTE — Tx Team (Signed)
Interdisciplinary Treatment and Diagnostic Plan Update  01/13/2019 Time of Session: 9:00am Natasha Long MRN: [id number]  Principal Diagnosis: <principal problem not specified>  Secondary Diagnoses: Active Problems:   MDD (major depressive disorder), severe (H[facility])   Current Medications:  Current Facility-Administered Medications  Medication Dose Route Frequency Provider Last Rate Last Dose  . acetaminophen (TYLENOL) tablet 650 mg  650 mg Oral Q6H PRN Laverle Hobby, PA-C      . alum & mag hydroxide-simeth (MAALOX/MYLANTA) 200-200-20 MG/5ML suspension 30 mL  30 mL Oral Q4H PRN Patriciaann Clan E, PA-C      . ARIPiprazole (ABILIFY) tablet 15 mg  15 mg Oral Daily Sharma Covert, MD   15 mg at 01/13/19 1191  . gabapentin (NEURONTIN) capsule 800 mg  800 mg Oral TID Sharma Covert, MD   800 mg at 01/13/19 4782  . hydrOXYzine (ATARAX/VISTARIL) tablet 25 mg  25 mg Oral Q6H PRN Patriciaann Clan E, PA-C      . magnesium hydroxide (MILK OF MAGNESIA) suspension 30 mL  30 mL Oral Daily PRN Patriciaann Clan E, PA-C      . nicotine (NICODERM CQ - dosed in mg/24 hours) patch 21 mg  21 mg Transdermal Daily Nwoko, Agnes I, NP      . traZODone (DESYREL) tablet 100 mg  100 mg Oral QHS PRN Sharma Covert, MD      . ziprasidone (GEODON) 20 MG injection           . ziprasidone (GEODON) injection 20 mg  20 mg Intramuscular Once Sharma Covert, MD       PTA Medications: Medications Prior to Admission  Medication Sig Dispense Refill Last Dose  . ARIPiprazole (ABILIFY) 15 MG tablet Take 1 tablet (15 mg total) by mouth daily. 30 tablet 0 01/12/2019 at Unknown time  . gabapentin (NEURONTIN) 800 MG tablet Take 1 tablet (800 mg total) by mouth 3 (three) times daily. 90 tablet 0 01/12/2019 at Unknown time  . hydrOXYzine (ATARAX/VISTARIL) 25 MG tablet Take 1 tablet (25 mg total) by mouth 3 (three) times daily as needed for anxiety. (Patient taking differently: Take 50 mg by mouth 3 (three) times daily as  needed for anxiety. ) 60 tablet 0 01/12/2019 at Unknown time  . traZODone (DESYREL) 100 MG tablet Take 1 tablet (100 mg total) by mouth at bedtime as needed for sleep. 10 tablet 0 Past Week at Unknown time    Patient Stressors: Financial difficulties Health problems Marital or family conflict Occupational concerns Substance abuse  Patient Strengths: Motivation for treatment/growth  Treatment Modalities: Medication Management, Group therapy, Case management,  1 to 1 session with clinician, Psychoeducation, Recreational therapy.   Physician Treatment Plan for Primary Diagnosis: <principal problem not specified> Long Term Goal(s):     Short Term Goals:    Medication Management: Evaluate patient's response, side effects, and tolerance of medication regimen.  Therapeutic Interventions: 1 to 1 sessions, Unit Group sessions and Medication administration.  Evaluation of Outcomes: Not Met  Physician Treatment Plan for Secondary Diagnosis: Active Problems:   MDD (major depressive disorder), severe ([place])  Long Term Goal(s):     Short Term Goals:       Medication Management: Evaluate patient's response, side effects, and tolerance of medication regimen.  Therapeutic Interventions: 1 to 1 sessions, Unit Group sessions and Medication administration.  Evaluation of Outcomes: Not Met   RN Treatment Plan for Primary Diagnosis: <principal problem not specified> Long Term Goal(s): Knowledge of disease and therapeutic regimen  to maintain health will improve  Short Term Goals: Ability to remain free from injury will improve, Ability to verbalize frustration and anger appropriately will improve, Ability to demonstrate self-control, Ability to identify and develop effective coping behaviors will improve and Compliance with prescribed medications will improve  Medication Management: RN will administer medications as ordered by provider, will assess and evaluate patient's response and provide  education to patient for prescribed medication. RN will report any adverse and/or side effects to prescribing provider.  Therapeutic Interventions: 1 on 1 counseling sessions, Psychoeducation, Medication administration, Evaluate responses to treatment, Monitor vital signs and CBGs as ordered, Perform/monitor CIWA, COWS, AIMS and Fall Risk screenings as ordered, Perform wound care treatments as ordered.  Evaluation of Outcomes: Not Met   LCSW Treatment Plan for Primary Diagnosis: <principal problem not specified> Long Term Goal(s): Safe transition to appropriate next level of care at discharge, Engage patient in therapeutic group addressing interpersonal concerns.  Short Term Goals: Engage patient in aftercare planning with referrals and resources, Increase social support, Increase ability to appropriately verbalize feelings, Increase emotional regulation, Identify triggers associated with mental health/substance abuse issues and Increase skills for wellness and recovery  Therapeutic Interventions: Assess for all discharge needs, 1 to 1 time with Social worker, Explore available resources and support systems, Assess for adequacy in community support network, Educate family and significant other(s) on suicide prevention, Complete Psychosocial Assessment, Interpersonal group therapy.  Evaluation of Outcomes: Not Met   Progress in Treatment: Attending groups: No. Participating in groups: No. Taking medication as prescribed: Yes. Toleration medication: Yes. Family/Significant other contact made: No, will contact:  supports if consents are granted Patient understands diagnosis: Yes. Discussing patient identified problems/goals with staff: Yes. Medical problems stabilized or resolved: No. Denies suicidal/homicidal ideation: No. Issues/concerns per patient self-inventory: Yes.  New problem(s) identified: No, Describe:  CSW continuing to assess. Patient has limited supports and is homeless.  Aggressive on unit.  New Short Term/Long Term Goal(s): detox, medication management for mood stabilization; elimination of SI thoughts; development of comprehensive mental wellness/sobriety plan.  Patient Goals: Address SI and depression   Discharge Plan or Barriers: CSW continuing to assess for appropriate referrals.   Reason for Continuation of Hospitalization: Aggression Anxiety Depression Suicidal ideation  Estimated Length of Stay: 3-5 days  Attendees: Patient: 01/13/2019 10:43 AM  Physician:  01/13/2019 10:43 AM  Nursing:  01/13/2019 10:43 AM  RN Care Manager: 01/13/2019 10:43 AM  Social Worker: Stephanie Acre, [place] 01/13/2019 10:43 AM  Recreational Therapist:  01/13/2019 10:43 AM  Other:  01/13/2019 10:43 AM  Other:  01/13/2019 10:43 AM  Other: 01/13/2019 10:43 AM    Scribe for Treatment Team: Joellen [place], [place] 01/13/2019 10:43 AM

## 2019-01-13 NOTE — Progress Notes (Signed)
[facility] was called this morning following patient being verbally and physically aggressive to staff. Patient given PO medication and is sleeping. As such, CSW unable to complete PSA with patient; will follow up at a later time.  Stephanie Acre, LCSW-A Clinical Social Worker

## 2019-01-13 NOTE — H&P (Signed)
Psychiatric Admission Assessment Adult  Patient Identification: Natasha Long MRN:  [id number] Date of Evaluation:  01/13/2019 Chief Complaint:  MDD Principal Diagnosis: <principal problem not specified> Diagnosis:  Active Problems:   MDD (major depressive disorder), severe ([place])  History of Present Illness:  Patient is seen and examined.  Patient is a 23 year old female with a past psychiatric history significant for borderline personality disorder, reportedly bipolar disorder, and polysubstance use disorders who presented to the [facility] emergency department on 2/13 with suicidal ideation.  Patient stated that she had gone through a break-up, had lost the place where she was living, and was living in a shelter.  Patient stated that she had broken up with her boyfriend back in December, and he had gone back to [place] 2 days after that.  She is been in the shelter since that time.  She is essentially homeless.  She is from [place] but stated that her family does not support her.  She was stranded in [place] 2 years ago.  She admitted that she had been abusing substances, and been using "reckless sex".  She also admitted to an addiction to "self-harm".  She cut herself several weeks ago with a razor.  She admitted to using [facility], marijuana, and her drug screen was positive for cocaine as well.  Her last psychiatric hospitalization at our facility was on 11/15/2018.  Her diagnosis at that time was borderline personality disorder, cocaine use disorder, PTSD and cannabis use disorder.  She was discharged on Abilify, gabapentin, hydroxyzine and trazodone.  She stated that the doctor at [facility] only does med management, and that her therapist there does not schedule her for follow-up appointments.  She admitted to helplessness, hopelessness and worthlessness.  She admitted to suicidal ideation.  She was admitted to the hospital for evaluation and stabilization.  Associated  Signs/Symptoms: Depression Symptoms:  depressed mood, anhedonia, insomnia, psychomotor agitation, fatigue, feelings of worthlessness/guilt, difficulty concentrating, hopelessness, suicidal thoughts with specific plan, anxiety, panic attacks, loss of energy/fatigue, disturbed sleep, (Hypo) Manic Symptoms:  Impulsivity, Irritable Mood, Labiality of Mood, Sexually Inapproprite Behavior, Anxiety Symptoms:  Excessive Worry, Psychotic Symptoms:  Denied PTSD Symptoms: Had a traumatic exposure:  In the past Total Time spent with patient: 30 minutes  Past Psychiatric History: Patient has had several psychiatric admissions in the past.  Her last psychiatric admission to our facility was on 11/15/2018.  She stated at that time she had been admitted to the hospital more than 20 times in her lifetime.  She had been in foster homes, group homes and residential programs.  At that time her chief complaint was that she "wanted to get clean".  She was looking for medical clearance to go to [facility] in [place].  She had been hospitalized there in March or April.  She wanted to go to [facility] after her hospitalization in December.  Apparently that did not take place.  She had previously been prescribed Abilify and gabapentin as an outpatient, but had not been compliant with that medication.  She was placed back on that during the course of that hospitalization.  She stated her therapist told her she needs to be on different medications, but apparently the psychiatrist there was either unwilling or did not believe it to be necessary.  She has been diagnosed with borderline personality disorder as well as posttraumatic stress disorder and bipolar disorder.  Is the patient at risk to self? Yes.    Has the patient been a risk to  self in the past 6 months? Yes.    Has the patient been a risk to self within the distant past? Yes.    Is the patient a risk to others? Yes.    Has the patient been a  risk to others in the past 6 months? Yes.    Has the patient been a risk to others within the distant past? Yes.     Prior Inpatient Therapy:   Prior Outpatient Therapy:    Alcohol Screening: Patient refused Alcohol Screening Tool: Yes 1. How often do you have a drink containing alcohol?: Monthly or less 2. How many drinks containing alcohol do you have on a typical day when you are drinking?: 1 or 2 3. How often do you have six or more drinks on one occasion?: Never AUDIT-C Score: 1 4. How often during the last year have you found that you were not able to stop drinking once you had started?: Never 5. How often during the last year have you failed to do what was normally expected from you becasue of drinking?: Never 6. How often during the last year have you needed a first drink in the morning to get yourself going after a heavy drinking session?: Never 7. How often during the last year have you had a feeling of guilt of remorse after drinking?: Never 8. How often during the last year have you been unable to remember what happened the night before because you had been drinking?: Never 9. Have you or someone else been injured as a result of your drinking?: No 10. Has a relative or friend or a doctor or another health worker been concerned about your drinking or suggested you cut down?: No Alcohol Use Disorder Identification Test Final Score (AUDIT): 1 Alcohol Brief Interventions/Follow-up: Patient Refused Substance Abuse History in the last 12 months:  Yes.   Consequences of Substance Abuse: Family Consequences:  Homelessness Previous Psychotropic Medications: Yes  Psychological Evaluations: Yes  Past Medical History:  Past Medical History:  Diagnosis Date  . Bipolar affective, manic ([place])   . Hypertension    diagnosed as child; stopped meds at 70 yo  . Hypoglycemia   . Insomnia   . Nephrotic syndrome   . Nephrotic syndrome   . PTSD (post-traumatic stress disorder)   . Renal  disease     Past Surgical History:  Procedure Laterality Date  . CHOLECYSTECTOMY    . extraction of wisdom teeth    . RENAL BIOPSY     Family History:  Family History  Problem Relation Age of Onset  . Diabetes Other   . Hypertension Other    Family Psychiatric  History:  Tobacco Screening: Have you used any form of tobacco in the last 30 days? (Cigarettes, Smokeless Tobacco, Cigars, and/or Pipes): Yes Tobacco use, Select all that apply: 5 or more cigarettes per day Are you interested in Tobacco Cessation Medications?: Yes, will notify MD for an order Counseled patient on smoking cessation including recognizing danger situations, developing coping skills and basic information about quitting provided: Refused/Declined practical counseling Social History:  Social History   Substance and Sexual Activity  Alcohol Use No     Social History   Substance and Sexual Activity  Drug Use Yes  . Types: Marijuana, Cocaine    Additional Social History:      Pain Medications: See MAR Prescriptions: See MAR Over the Counter: See MAR History of alcohol / drug use?: Yes Longest period of sobriety (when/how long): Monlh long  for both substances Negative Consequences of Use: Financial, Personal relationships, Work / School Withdrawal Symptoms: Nausea / Vomiting Name of Substance 1: Marijuana 1 - Age of First Use: Per chart, "22 years of age."  1 - Amount (size/oz): Pt reported, smoking a blunt today.  1 - Frequency: Per chart, "daily."  1 - Duration: Per chart, "past two years at this rate."  1 - Last Use / Amount: 01/12/2019. Name of Substance 2: Molly.  2 - Age of First Use: UTA 2 - Amount (size/oz): Pt reported, "4 g's, between today and yesterday."  2 - Frequency: Ongoing. 2 - Duration: UTA 2 - Last Use / Amount: 01/12/2019. Name of Substance 3: Black and Milds. 3 - Age of First Use: UTA 3 - Amount (size/oz): Pt reported, smoking two Black and Milds, daily.  3 - Frequency:  Ongoing.  3 - Duration: Daily.  3 - Last Use / Amount: 01/12/2019.              Allergies:   Allergies  Allergen Reactions  . Prednisone Other (See Comments)    Pt states that this med caused pancreatitis.   . Prozac [Fluoxetine Hcl] Other (See Comments)    Panic attack   . Wellbutrin [Bupropion] Other (See Comments)    Panic attack   Lab Results:  Results for orders placed or performed during the hospital encounter of 01/12/19 (from the past 48 hour(s))  Comprehensive metabolic panel     Status: Abnormal   Collection Time: 01/12/19  6:15 PM  Result Value Ref Range   Sodium 138 135 - 145 mmol/L   Potassium 3.9 3.5 - 5.1 mmol/L   Chloride 105 98 - 111 mmol/L   CO2 26 22 - 32 mmol/L   Glucose, Bld 100 (H) 70 - 99 mg/dL   BUN 16 6 - 20 mg/dL   Creatinine, Ser 1.16 (H) 0.44 - 1.00 mg/dL   Calcium 8.9 8.9 - 10.3 mg/dL   Total Protein 7.2 6.5 - 8.1 g/dL   Albumin 3.5 3.5 - 5.0 g/dL   AST 28 15 - 41 U/L   ALT 30 0 - 44 U/L   Alkaline Phosphatase 51 38 - 126 U/L   Total Bilirubin 0.4 0.3 - 1.2 mg/dL   GFR calc non Af Amer >60 >60 mL/min   GFR calc Af Amer >60 >60 mL/min   Anion gap 7 5 - 15    Comment: Performed at [facility], [place] [address] [place], [place] [address]  Ethanol     Status: None   Collection Time: 01/12/19  6:15 PM  Result Value Ref Range   Alcohol, Ethyl (B) <10 <10 mg/dL    Comment: (NOTE) Lowest detectable limit for serum alcohol is 10 mg/dL. For medical purposes only. Performed at [facility], [place] [address], [place], [place] [address]   Salicylate level     Status: None   Collection Time: 01/12/19  6:15 PM  Result Value Ref Range   Salicylate Lvl <7.9 2.8 - 30.0 mg/dL    Comment: Performed at [facility], [place] [address] [place], [place] [address]  Acetaminophen level     Status: Abnormal   Collection Time: 01/12/19  6:15 PM  Result Value Ref Range   Acetaminophen (Tylenol), Serum  <10 (L) 10 - 30 ug/mL    Comment: (NOTE) Therapeutic concentrations vary significantly. A range of 10-30 ug/mL  may be an effective concentration for many patients. However, some  are  best treated at concentrations outside of this range. Acetaminophen concentrations >150 ug/mL at 4 hours after ingestion  and >50 ug/mL at 12 hours after ingestion are often associated with  toxic reactions. Performed at [facility], [place] [address], [place], [place] [address]   cbc     Status: Abnormal   Collection Time: 01/12/19  6:15 PM  Result Value Ref Range   WBC 10.5 4.0 - 10.5 K/uL   RBC 5.10 3.87 - 5.11 MIL/uL   Hemoglobin 15.6 (H) 12.0 - 15.0 g/dL   HCT 47.6 (H) 36.0 - 46.0 %   MCV 93.3 80.0 - 100.0 fL   MCH 30.6 26.0 - 34.0 pg   MCHC 32.8 30.0 - 36.0 g/dL   RDW 13.2 11.5 - 15.5 %   Platelets 268 150 - 400 K/uL   nRBC 0.0 0.0 - 0.2 %    Comment: Performed at [facility], [place] [address] [place], [place] [address]  I-Stat beta hCG blood, ED     Status: None   Collection Time: 01/12/19  6:20 PM  Result Value Ref Range   I-stat hCG, quantitative <5.0 <5 mIU/mL   Comment 3            Comment:   GEST. AGE      CONC.  (mIU/mL)   <=1 WEEK        5 - 50     2 WEEKS       50 - 500     3 WEEKS       100 - 10,000     4 WEEKS     1,000 - 30,000        FEMALE AND NON-PREGNANT FEMALE:     LESS THAN 5 mIU/mL   Wet prep, genital     Status: Abnormal   Collection Time: 01/12/19  7:52 PM  Result Value Ref Range   Yeast Wet Prep HPF POC NONE SEEN NONE SEEN    Comment: Swab received with less than 0.5 mL of saline, saline added to specimen, interpret results with caution.   Trich, Wet Prep NONE SEEN NONE SEEN   Clue Cells Wet Prep HPF POC PRESENT (A) NONE SEEN   WBC, Wet Prep HPF POC NONE SEEN NONE SEEN   Sperm NONE SEEN     Comment: Performed at [facility], [place] [address] [place], [place] [address]  Rapid urine drug screen (hospital  performed)     Status: Abnormal   Collection Time: 01/12/19 11:40 PM  Result Value Ref Range   Opiates NONE DETECTED NONE DETECTED   Cocaine POSITIVE (A) NONE DETECTED   Benzodiazepines NONE DETECTED NONE DETECTED   Amphetamines NONE DETECTED NONE DETECTED   Tetrahydrocannabinol POSITIVE (A) NONE DETECTED   Barbiturates NONE DETECTED NONE DETECTED    Comment: (NOTE) DRUG SCREEN FOR MEDICAL PURPOSES ONLY.  IF CONFIRMATION IS NEEDED FOR ANY PURPOSE, NOTIFY LAB WITHIN 5 DAYS. LOWEST DETECTABLE LIMITS FOR URINE DRUG SCREEN Drug Class                     Cutoff (ng/mL) Amphetamine and metabolites    1000 Barbiturate and metabolites    200 Benzodiazepine                 169 Tricyclics and metabolites     300 Opiates and metabolites        300 Cocaine and metabolites        300  THC                            50 Performed at [facility], [place] [address] [place], [place] [address]   Urinalysis, Routine w reflex microscopic     Status: Abnormal   Collection Time: 01/12/19 11:40 PM  Result Value Ref Range   Color, Urine YELLOW YELLOW   APPearance CLEAR CLEAR   Specific Gravity, Urine 1.025 1.005 - 1.030   pH 5.0 5.0 - 8.0   Glucose, UA NEGATIVE NEGATIVE mg/dL   Hgb urine dipstick NEGATIVE NEGATIVE   Bilirubin Urine NEGATIVE NEGATIVE   Ketones, ur NEGATIVE NEGATIVE mg/dL   Protein, ur >=300 (A) NEGATIVE mg/dL   Nitrite NEGATIVE NEGATIVE   Leukocytes,Ua NEGATIVE NEGATIVE   RBC / HPF 0-5 0 - 5 RBC/hpf   WBC, UA 0-5 0 - 5 WBC/hpf   Bacteria, UA NONE SEEN NONE SEEN   Squamous Epithelial / LPF 0-5 0 - 5   Mucus PRESENT     Comment: Performed at [facility], [place] [address], [place], [place] [address]    Blood Alcohol level:  Lab Results  Component Value Date   ETH <10 01/12/2019   ETH <10 99/37/1696    Metabolic Disorder Labs:  No results found for: HGBA1C, MPG No results found for: PROLACTIN Lab Results  Component Value Date   CHOL  169 09/15/2017   TRIG 196 (H) 09/15/2017   HDL 53 09/15/2017   CHOLHDL 3.2 09/15/2017   LDLCALC 77 09/15/2017    Current Medications: Current Facility-Administered Medications  Medication Dose Route Frequency Provider Last Rate Last Dose  . acetaminophen (TYLENOL) tablet 650 mg  650 mg Oral Q6H PRN Laverle Hobby, PA-C   650 mg at 01/13/19 0948  . alum & mag hydroxide-simeth (MAALOX/MYLANTA) 200-200-20 MG/5ML suspension 30 mL  30 mL Oral Q4H PRN Patriciaann Clan E, PA-C      . ARIPiprazole (ABILIFY) tablet 15 mg  15 mg Oral Daily Sharma Covert, MD   15 mg at 01/13/19 7893  . gabapentin (NEURONTIN) capsule 800 mg  800 mg Oral TID Sharma Covert, MD   800 mg at 01/13/19 8101  . hydrOXYzine (ATARAX/VISTARIL) tablet 25 mg  25 mg Oral Q6H PRN Patriciaann Clan E, PA-C      . magnesium hydroxide (MILK OF MAGNESIA) suspension 30 mL  30 mL Oral Daily PRN Patriciaann Clan E, PA-C      . nicotine (NICODERM CQ - dosed in mg/24 hours) patch 21 mg  21 mg Transdermal Daily Nwoko, Agnes I, NP      . traZODone (DESYREL) tablet 100 mg  100 mg Oral QHS PRN Sharma Covert, MD      . ziprasidone (GEODON) 20 MG injection           . ziprasidone (GEODON) injection 20 mg  20 mg Intramuscular Once Sharma Covert, MD       PTA Medications: Medications Prior to Admission  Medication Sig Dispense Refill Last Dose  . ARIPiprazole (ABILIFY) 15 MG tablet Take 1 tablet (15 mg total) by mouth daily. 30 tablet 0 01/12/2019 at Unknown time  . gabapentin (NEURONTIN) 800 MG tablet Take 1 tablet (800 mg total) by mouth 3 (three) times daily. 90 tablet 0 01/12/2019 at Unknown time  . hydrOXYzine (ATARAX/VISTARIL) 25 MG tablet Take 1 tablet (25 mg total) by mouth 3 (three) times daily as needed for  anxiety. (Patient taking differently: Take 50 mg by mouth 3 (three) times daily as needed for anxiety. ) 60 tablet 0 01/12/2019 at Unknown time  . traZODone (DESYREL) 100 MG tablet Take 1 tablet (100 mg total) by mouth at  bedtime as needed for sleep. 10 tablet 0 Past Week at Unknown time    Musculoskeletal: Strength & Muscle Tone: within normal limits Gait & Station: normal Patient leans: N/A  Psychiatric Specialty Exam: Physical Exam  Nursing note and vitals reviewed. Constitutional: She is oriented to person, place, and time. She appears well-developed and well-nourished.  HENT:  Head: Normocephalic and atraumatic.  Respiratory: Effort normal.  Neurological: She is alert and oriented to person, place, and time.    ROS  Blood pressure 135/70, pulse 79, temperature 98.8 F (37.1 C), temperature source Oral, height 5\' 8"  (1.727 m), weight 122.4 kg, last menstrual period 01/28/2018, SpO2 98 %, unknown if currently breastfeeding.Body mass index is 41.03 kg/m.  General Appearance: Disheveled  Eye Contact:  Poor  Speech:  Normal Rate  Volume:  Increased  Mood:  Anxious, Depressed, Dysphoric and Irritable  Affect:  Congruent  Thought Process:  Coherent and Descriptions of Associations: Circumstantial  Orientation:  Full (Time, Place, and Person)  Thought Content:  Rumination  Suicidal Thoughts:  Yes.  without intent/plan  Homicidal Thoughts:  Yes.  without intent/plan  Memory:  Immediate;   Fair Recent;   Fair Remote;   Fair  Judgement:  Impaired  Insight:  Lacking  Psychomotor Activity:  Increased  Concentration:  Concentration: Fair and Attention Span: Fair  Recall:  [employer] of Knowledge:  Fair  Language:  Good  Akathisia:  Negative  Handed:  Right  AIMS (if indicated):     Assets:  Desire for Improvement Physical Health Resilience  ADL's:  Intact  Cognition:  WNL  Sleep:  Number of Hours: 3.5    Treatment Plan Summary: Daily contact with patient to assess and evaluate symptoms and progress in treatment, Medication management and Plan Patient is seen and examined.  Patient is a 23 year old female with the above-stated past psychiatric history is admitted with suicidal ideation.   She will be admitted to the hospital.  Should be integrated into the milieu.  She will be encouraged to attend groups.  The patient is requesting changes in her medications, but I believe that mainly we have see what she looks like once she begins to withdrawal from her substances.  We will restart the Abilify, gabapentin, hydroxyzine and trazodone at their previous dosages.  Review of her laboratories show a mild increase in her creatinine at 1.16.  This is mildly elevated from previous.  It may be a degree of dehydration to a certain extent.  We will repeat this during the course the hospitalization.  She presented to the emergency room on 1/21 and had positive chlamydia as well as gonorrhea.  At [facility] long she was given ceftriaxone, azithromycin.  She declined HIV testing or syphilis testing at that time.  I will ask her to pursue an HIV and RPR while she is in the hospital once her mental status has improved to a degree.  Her vital signs are stable, she is afebrile.  Hopefully things will improve shortly.  Observation Level/Precautions:  Detox 15 minute checks  Laboratory:  Chemistry Profile  Psychotherapy:    Medications:    Consultations:    Discharge Concerns:    Estimated LOS:  Other:     Physician Treatment Plan for Primary  Diagnosis: <principal problem not specified> Long Term Goal(s): Improvement in symptoms so as ready for discharge  Short Term Goals: Ability to identify changes in lifestyle to reduce recurrence of condition will improve, Ability to verbalize feelings will improve, Ability to disclose and discuss suicidal ideas, Ability to demonstrate self-control will improve, Ability to identify and develop effective coping behaviors will improve, Ability to maintain clinical measurements within normal limits will improve, Compliance with prescribed medications will improve and Ability to identify triggers associated with substance abuse/mental health issues will improve  Physician  Treatment Plan for Secondary Diagnosis: Active Problems:   MDD (major depressive disorder), severe ([facility])  Long Term Goal(s): Improvement in symptoms so as ready for discharge  Short Term Goals: Ability to identify changes in lifestyle to reduce recurrence of condition will improve, Ability to verbalize feelings will improve, Ability to disclose and discuss suicidal ideas, Ability to demonstrate self-control will improve, Ability to identify and develop effective coping behaviors will improve, Ability to maintain clinical measurements within normal limits will improve, Compliance with prescribed medications will improve and Ability to identify triggers associated with substance abuse/mental health issues will improve  I certify that inpatient services furnished can reasonably be expected to improve the patient's condition.    Sharma Covert, MD 2/14/20201:25 PM
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
## 2019-01-13 NOTE — ED Notes (Signed)
Pt transferred to [facility] via [facility]. Pt belonging given to driver.

## 2019-01-13 NOTE — Progress Notes (Signed)
Pt is being sent to [facility] radiology for an xray of her hand. Report called and [facility] called for transportation.

## 2019-01-13 NOTE — [facility] Group Notes (Signed)
[place] LCSW Group Therapy Note  Date/Time 01/13/2019 1:30 PM  Type of Therapy/Topic:  Group Therapy:  Feelings about Diagnosis  Participation Level:  Did Not Attend   Mood:   Description of Group:    This group will allow patients to explore their thoughts and feelings about diagnoses they have received. Patients will be guided to explore their level of understanding and acceptance of these diagnoses. Facilitator will encourage patients to process their thoughts and feelings about the reactions of others to their diagnosis, and will guide patients in identifying ways to discuss their diagnosis with significant others in their lives. This group will be process-oriented, with patients participating in exploration of their own experiences as well as giving and receiving support and challenge from other group members.  Lawana Pai, MSW Intern [place] Department 01/13/2019 8:818 AM

## 2019-01-13 NOTE — Progress Notes (Signed)
Recreation Therapy Notes  Date:  2.14.19 Time: 0930 Location: [address] Dayroom  Group Topic: Stress Management  Goal Area(s) Addresses:  Patient will identify positive stress management techniques. Patient will identify benefits of using stress management post d/c.  Intervention: Stress Management  Activity :  Meditation.  LRT introduced the stress management technique of meditation.  LRT played a meditation on having a purposeful day.  Patients were to listen and follow along as meditation played.  Education:  Stress Management, Discharge Planning.   Education Outcome: Acknowledges Education  Clinical Observations/Feedback: Pt did not attend group.     Victorino Sparrow, LRT/CTRS         Victorino Sparrow A 01/13/2019 12:17 PM

## 2019-01-13 NOTE — Discharge Instructions (Signed)
Notes from your ED provider:   You were treated for bacterial vaginosis.  This is an overgrowth of normal bacteria in the vagina.  You are prescribed Flagyl. Flagyl is an antibiotic used to treat bacterial vaginosis. This medication CANNOT be taken with alcohol, because it can cause nausea and vomiting combined with alcohol. Please also refrain from drinking alcohol for 48 hours after you finish the medicine.  Given that you have had sexual intercourse unprotected, I recommend checking a pregnancy test in 7 to 10 days if you are still concerned about pregnancy.
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
## 2019-01-13 NOTE — Tx Team (Signed)
Initial Treatment Plan 01/13/2019 2:19 AM Candy Oletta Lamas HCW:[id number]    PATIENT STRESSORS: Financial difficulties Health problems Marital or family conflict Occupational concerns Substance abuse   PATIENT STRENGTHS: Motivation for treatment/growth   PATIENT IDENTIFIED PROBLEMS: SI "I just want to self harm"  Depression " I always feel this way"                   DISCHARGE CRITERIA:  Ability to meet basic life and health needs Improved stabilization in mood, thinking, and/or behavior Medical problems require only outpatient monitoring Motivation to continue treatment in a less acute level of care  PRELIMINARY DISCHARGE PLAN: Attend aftercare/continuing care group Outpatient therapy Return to previous work or school arrangements  PATIENT/FAMILY INVOLVEMENT: This treatment plan has been presented to and reviewed with the patient, [name], .  The patient has been given the opportunity to ask questions and make suggestions.  Ronnie Doss, RN 01/13/2019, 2:19 AM

## 2019-01-13 NOTE — Progress Notes (Deleted)
Pt d/c from the hospital. All items returned. D/C instructions given, prescriptions given, samples given and suicide information reviewed.

## 2019-01-14 ENCOUNTER — Other Ambulatory Visit: Payer: Self-pay

## 2019-01-14 ENCOUNTER — Encounter (HOSPITAL_COMMUNITY): Payer: Self-pay

## 2019-01-14 MED ORDER — NYSTATIN 100000 UNIT/GM EX POWD
Freq: Two times a day (BID) | CUTANEOUS | Status: DC
  Administered 2019-01-15 – 2019-01-16 (×2): via TOPICAL
  Filled 2019-01-14: qty 15

## 2019-01-14 MED ORDER — METRONIDAZOLE 500 MG PO TABS
500.0000 mg | ORAL_TABLET | Freq: Two times a day (BID) | ORAL | Status: DC
  Administered 2019-01-14 – 2019-01-16 (×4): 500 mg via ORAL
  Filled 2019-01-14: qty 1
  Filled 2019-01-14: qty 2
  Filled 2019-01-14 (×7): qty 1

## 2019-01-14 MED ORDER — NAPROXEN 500 MG PO TABS
500.0000 mg | ORAL_TABLET | Freq: Once | ORAL | Status: AC
  Administered 2019-01-14: 500 mg via ORAL
  Filled 2019-01-14: qty 1

## 2019-01-14 MED ORDER — NAPROXEN 500 MG PO TABS: 500.0000 mg | ORAL_TABLET | Freq: Two times a day (BID) | ORAL | 0 refills | Status: DC

## 2019-01-14 NOTE — BHH Group Notes (Signed)
[facility] Group Notes: (Clinical Social Work)   01/14/2019      Type of Therapy:  Group Therapy   Participation Level:  Did Not Attend despite MHT prompting   Selmer Dominion, LCSW 01/14/2019, 12:57 PM
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
## 2019-01-14 NOTE — ED Notes (Signed)
[facility] called for transport. 

## 2019-01-14 NOTE — Progress Notes (Signed)
D: Patient observed up and around milieu. Attended AA. Patient states she continues to have pain in her L foot rated at an 8/10 and minimal pain in her L pinky finger which is splinted (3/10). She indicates she has had a good. Somewhat cautious in her interations though is assertive with her needs which can be frequent at times. Patient's affect anxious with congruent mood.  Requesting flagyl and nystatin for her belly button.   A: Medicated per orders, orders received. Prn tylenol, trazadone and vistaril given for complaints. Medication education provided. Level III obs in place for safety. Emotional support offered. Patient encouraged to complete Suicide Safety Plan before discharge. Encouraged to attend and participate in unit programming.  R: Patient verbalizes understanding of POC. On reassess, patient is resting in bed, pain is at a 5. Patient denies SI/HI/AVH and remains safe on level III obs. Will continue to monitor throughout the night.

## 2019-01-14 NOTE — ED Provider Notes (Signed)
[facility] Emergency Department Provider Note MRN:  [id number]  Arrival date & time: 01/14/19     Chief Complaint   Hand Pain and Foot Pain   History of Present Illness   Natasha Long is a 23 y.o. year-old female with a history of bipolar disorder presenting to the ED with chief complaint of hand pain and foot pain.  Patient explains that she punched a wall yesterday and sustained a broken bone to her hand.  Continues to have left hand pain that is constant, worse with motion.  Patient explains that 1 month ago she rolled her ankle and chipped a bone in her foot.  A few days ago rolled her ankle again and has had exacerbation of pain in this left ankle.  Worse with motion, able to ambulate.  Feels paresthesias to the top of the foot.  Denies any other symptoms.  Continues to work on her mood issues, currently admitted at behavioral health.  Review of Systems  A complete 10 system review of systems was obtained and all systems are negative except as noted in the HPI and PMH.   Patient's Health History    Past Medical History:  Diagnosis Date  . Bipolar affective, manic ([place])   . Hypertension    diagnosed as child; stopped meds at 34 yo  . Hypoglycemia   . Insomnia   . Nephrotic syndrome   . Nephrotic syndrome   . PTSD (post-traumatic stress disorder)   . Renal disease     Past Surgical History:  Procedure Laterality Date  . CHOLECYSTECTOMY    . extraction of wisdom teeth    . RENAL BIOPSY      Family History  Problem Relation Age of Onset  . Diabetes Other   . Hypertension Other     Social History   Socioeconomic History  . Marital status: Single    Spouse name: Not on file  . Number of children: 1  . Years of education: Not on file  . Highest education level: 12th grade  Occupational History  . Not on file  Social Needs  . Financial resource strain: Patient refused  . Food insecurity:    Worry: Patient refused    Inability: Patient  refused  . Transportation needs:    Medical: Patient refused    Non-medical: Patient refused  Tobacco Use  . Smoking status: Light Tobacco Smoker    Types: Cigarettes  . Smokeless tobacco: Never Used  Substance and Sexual Activity  . Alcohol use: No  . Drug use: Yes    Types: Marijuana, Cocaine  . Sexual activity: Yes    Birth control/protection: None  Lifestyle  . Physical activity:    Days per week: Patient refused    Minutes per session: Patient refused  . Stress: Patient refused  Relationships  . Social connections:    Talks on phone: Patient refused    Gets together: Patient refused    Attends religious service: Patient refused    Active member of club or organization: Patient refused    Attends meetings of clubs or organizations: Patient refused    Relationship status: Patient refused  . Intimate partner violence:    Fear of current or ex partner: Patient refused    Emotionally abused: Patient refused    Physically abused: Patient refused    Forced sexual activity: Patient refused  Other Topics Concern  . Not on file  Social History Narrative  . Not on file  Physical Exam  Vital Signs and Nursing Notes reviewed Vitals:   01/13/19 0143 01/14/19 0954  BP: 135/70 (!) 161/91  Pulse: 79 63  Resp:  18  Temp: 98.8 F (37.1 C) 97.8 F (36.6 C)  SpO2: 98% 98%    CONSTITUTIONAL: Well-appearing, NAD NEURO:  Alert and oriented x 3, no focal deficits EYES:  eyes equal and reactive ENT/NECK:  no LAD, no JVD CARDIO: Regular rate, well-perfused, normal S1 and S2 PULM:  CTAB no wheezing or rhonchi GI/GU:  normal bowel sounds, non-distended, non-tender MSK/SPINE:  No gross deformities, no edema; tenderness to palpation to the ulnar aspect of the left hand, lateral aspect of the left foot, neurovascularly intact SKIN:  no rash, atraumatic PSYCH:  Appropriate speech and behavior  Diagnostic and Interventional Summary    Labs Reviewed - No data to display  DG Hand  2 View Left  Final Result    DG Hand 2 View Right  Final Result      Medications  acetaminophen (TYLENOL) tablet 650 mg (650 mg Oral Given 01/14/19 0825)  alum & mag hydroxide-simeth (MAALOX/MYLANTA) 200-200-20 MG/5ML suspension 30 mL (has no administration in time range)  magnesium hydroxide (MILK OF MAGNESIA) suspension 30 mL (has no administration in time range)  hydrOXYzine (ATARAX/VISTARIL) tablet 25 mg (has no administration in time range)  ARIPiprazole (ABILIFY) tablet 15 mg (15 mg Oral Given 01/14/19 0825)  traZODone (DESYREL) tablet 100 mg (100 mg Oral Given 01/13/19 2057)  gabapentin (NEURONTIN) capsule 800 mg (800 mg Oral Given 01/14/19 0825)  ziprasidone (GEODON) injection 20 mg (20 mg Intramuscular Not Given 01/13/19 1010)  ziprasidone (GEODON) 20 MG injection (  Not Given 01/13/19 1009)  nicotine (NICODERM CQ - dosed in mg/24 hours) patch 21 mg (21 mg Transdermal Patch Removed 01/14/19 0826)  ziprasidone (GEODON) 40 MG capsule (80 mg  Given by Other 01/13/19 0958)  nicotine (NICODERM CQ - dosed in mg/24 hours) 21 mg/24hr patch (  Patch Applied 01/13/19 1008)  ziprasidone (GEODON) capsule 80 mg (0 mg Oral Duplicate 6/44/03 4742)  ARIPiprazole ER (ABILIFY MAINTENA) injection 400 mg (400 mg Intramuscular Given 01/13/19 1531)  naproxen (NAPROSYN) tablet 500 mg (500 mg Oral Given 01/14/19 1115)     .Splint Application Date/Time: 5/95/6387 11:36 AM Performed by: Maudie Flakes, MD Authorized by: Maudie Flakes, MD   Consent:    Consent obtained:  Verbal   Consent given by:  Patient Pre-procedure details:    Sensation:  Normal Procedure details:    Laterality:  Left   Location:  Wrist   Wrist:  L wrist   Splint type:  Ulnar gutter   Supplies:  Ortho-Glass Post-procedure details:    Pain:  Improved   Sensation:  Normal   Patient tolerance of procedure:  Tolerated well, no immediate complications Comments:     NVIT both before and after splint application   Critical  Care  ED Course and Medical Decision Making  I have reviewed the triage vital signs and the nursing notes.  Pertinent labs & imaging results that were available during my care of the patient were reviewed by me and considered in my medical decision making (see below for details).  X-rays yesterday confirm fifth metacarpal fracture, will splint today and provide orthopedic follow-up.  Rolled ankle seems more like an exacerbation of known condition in the past, little utility in repeat x-ray today, advised conservative management.  After the discussed management above, the patient was determined to be safe for discharge.  The patient was in agreement with this plan and all questions regarding their care were answered.  ED return precautions were discussed and the patient will return to the ED with any significant worsening of condition.   Barth Kirks. Sedonia Small, [place] [email or web address]  Final Clinical Impressions(s) / ED Diagnoses     ICD-10-CM   1. Closed nondisplaced fracture of shaft of fifth metacarpal bone of left hand, initial encounter S62.357A   2. Trauma T14.90XA DG Hand 2 View Left    DG Hand 2 View Right  3. Acute left ankle pain M25.572     ED Discharge Orders         Ordered    naproxen (NAPROSYN) 500 MG tablet  2 times daily     01/14/19 1130             Maudie Flakes, MD 01/14/19 1138

## 2019-01-14 NOTE — BHH Group Notes (Signed)
[facility] Group Notes:  (Nursing)  Date:  01/14/2019  Time:  1430  Type of Therapy:  Nurse Education  Participation Level:  Did Not Attend   Waymond Cera 01/14/2019, 3:50 PM

## 2019-01-14 NOTE — Discharge Instructions (Addendum)
You were evaluated in the Emergency Department and after careful evaluation, we did not find any emergent condition requiring admission or further testing in the hospital.  You have a broken bone in your hand.  We placed a splint today.  This injury will need to be followed up by an orthopedic specialist within the next 1 to 2 weeks.  Please use the ankle brace as needed for discomfort, would be beneficial to mention this injury to the orthopedic doctor as well.  Please return to the Emergency Department if you experience any worsening of your condition.  We encourage you to follow up with a primary care provider.  Thank you for allowing [place] to be a part of your care.

## 2019-01-14 NOTE — ED Notes (Signed)
Bed: [facility] Expected date:  Expected time:  Means of arrival:  Comments: 

## 2019-01-14 NOTE — Plan of Care (Signed)
  Problem: Coping: Goal: Ability to identify and develop effective coping behavior will improve Outcome: Progressing   Problem: Coping: Goal: Coping ability will improve Outcome: Progressing   D: Pt alert and oriented on the unit. Pt denies SI/HI, A/VH. [name] affect was flat and blunted. Pt went of the unit to the ED for a consult regarding her left finger (5th digit) when she punched the wall last evening. Pt returned to [facility] with a splint with ACE wrap on her left hand and arm, as well as an ankle brace from a previous ankle sprain. Pt is cooperative.  A: Education, support and encouragement provided, q15 minute safety checks remain in effect. Medications administered per MD orders.  R: No reactions/side effects to medicine noted. Pt denies any concerns at this time, and verbally contracts for safety. Pt ambulating on the unit with no issues. Pt remains safe on and off the unit.
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
## 2019-01-14 NOTE — Progress Notes (Signed)
Patient attended AA group meeting.  

## 2019-01-15 MED ORDER — GABAPENTIN 400 MG PO CAPS
400.0000 mg | ORAL_CAPSULE | Freq: Three times a day (TID) | ORAL | Status: DC
  Filled 2019-01-15 (×2): qty 1

## 2019-01-15 MED ORDER — ARIPIPRAZOLE 10 MG PO TABS
10.0000 mg | ORAL_TABLET | Freq: Every day | ORAL | Status: DC
  Filled 2019-01-15 (×2): qty 1

## 2019-01-15 MED ORDER — GABAPENTIN 400 MG PO CAPS
800.0000 mg | ORAL_CAPSULE | Freq: Three times a day (TID) | ORAL | Status: DC
  Administered 2019-01-15 – 2019-01-16 (×3): 800 mg via ORAL
  Filled 2019-01-15 (×8): qty 2

## 2019-01-15 MED ORDER — GABAPENTIN 400 MG PO CAPS: 800.0000 mg | ORAL_CAPSULE | Freq: Three times a day (TID) | ORAL | Status: DC

## 2019-01-15 NOTE — BHH Suicide Risk Assessment (Signed)
[facility] INPATIENT:  Family/Significant Other Suicide Prevention Education  Suicide Prevention Education:  Patient Refusal for Family/Significant Other Suicide Prevention Education: The patient Natasha Long has refused to provide written consent for family/significant other to be provided Family/Significant Other Suicide Prevention Education during admission and/or prior to discharge.  Physician notified.  SPE completed with pt, as pt refused to consent to family contact. SPI pamphlet provided to pt and pt was encouraged to share information with support network, ask questions, and talk about any concerns relating to SPE. Pt denies access to guns/firearms and verbalized understanding of information provided. Mobile Crisis information also provided to pt.   Avelina Laine LCSW 01/15/2019, 12:07 PM

## 2019-01-15 NOTE — Progress Notes (Addendum)
D:  Patient's self inventory sheet, patient sleeps good, sleep medication helpful.  Fair appetite, low energy level, good concentration.  Rated depression 3, hopeless and anxiety 9  Denied withdrawals.  SI, contracts for safety.  Physical problems, pain, L foot, L hand, worst pain #6 in past 24 hours.  No pain medicine.  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  No discharge plans. A:  Medications administered per MD orders.  Emotional support and encouragement given patient. R:  Denied SI and HI while talking to nurse today, contracts for safety.  Denied A/V hallucinations.  Safety maintained with 15 minute checks. Nurse rewrapped patient's hand today with ace wrap.  Patient denied pain.

## 2019-01-15 NOTE — Plan of Care (Signed)
Nurse discussed anxiety, depression and coping skills with patient.  

## 2019-01-15 NOTE — Progress Notes (Signed)
[facility] MD Progress Note  01/15/2019 10:10 AM Natasha Long  MRN:  [id number]   Subjective:  Endorses depression and anxiety, denies suicidal ideations at this time  23 yo female admitted for suicidal ideations with a plan to cut herself.  Last night she punched the wall for a reason she cannot identify.  Sent to the ED for evaluation this morning.  Returned with a broken finger.  Interacting in the milieu appropriately.  No psychosis or withdrawal symptoms noted  Principal Problem: Major depressive disorder, recurrent, severe without psychosis Diagnosis: Active Problems:   MDD (major depressive disorder), severe (HCC)  Total Time spent with patient: 45 minutes  Past Psychiatric History: bipolar affective disorder, borderline personality, substance abuse  Past Medical History:  Past Medical History:  Diagnosis Date  . Bipolar affective, manic ([place])   . Hypertension    diagnosed as child; stopped meds at 84 yo  . Hypoglycemia   . Insomnia   . Nephrotic syndrome   . Nephrotic syndrome   . PTSD (post-traumatic stress disorder)   . Renal disease     Past Surgical History:  Procedure Laterality Date  . CHOLECYSTECTOMY    . extraction of wisdom teeth    . RENAL BIOPSY     Family History:  Family History  Problem Relation Age of Onset  . Diabetes Other   . Hypertension Other    Family Psychiatric  History: none Social History:  Social History   Substance and Sexual Activity  Alcohol Use No     Social History   Substance and Sexual Activity  Drug Use Yes  . Types: Marijuana, Cocaine    Social History   Socioeconomic History  . Marital status: Single    Spouse name: Not on file  . Number of children: 1  . Years of education: Not on file  . Highest education level: 12th grade  Occupational History  . Not on file  Social Needs  . Financial resource strain: Patient refused  . Food insecurity:    Worry: Patient refused    Inability: Patient refused  .  Transportation needs:    Medical: Patient refused    Non-medical: Patient refused  Tobacco Use  . Smoking status: Light Tobacco Smoker    Types: Cigarettes  . Smokeless tobacco: Never Used  Substance and Sexual Activity  . Alcohol use: No  . Drug use: Yes    Types: Marijuana, Cocaine  . Sexual activity: Yes    Birth control/protection: None  Lifestyle  . Physical activity:    Days per week: Patient refused    Minutes per session: Patient refused  . Stress: Patient refused  Relationships  . Social connections:    Talks on phone: Patient refused    Gets together: Patient refused    Attends religious service: Patient refused    Active member of club or organization: Patient refused    Attends meetings of clubs or organizations: Patient refused    Relationship status: Patient refused  Other Topics Concern  . Not on file  Social History Narrative  . Not on file   Additional Social History:    Pain Medications: See MAR Prescriptions: See MAR Over the Counter: See MAR History of alcohol / drug use?: Yes Longest period of sobriety (when/how long): Mon[place] long for both substances Negative Consequences of Use: Financial, Personal relationships, Work / School Withdrawal Symptoms: Nausea / Vomiting Name of Substance 1: Marijuana 1 - Age of First Use: Per chart, "23 years of age."  1 - Amount (size/oz): Pt reported, smoking a blunt today.  1 - Frequency: Per chart, "daily."  1 - Duration: Per chart, "past two years at this rate."  1 - Last Use / Amount: 01/12/2019. Name of Substance 2: Molly.  2 - Age of First Use: UTA 2 - Amount (size/oz): Pt reported, "4 g's, between today and yesterday."  2 - Frequency: Ongoing. 2 - Duration: UTA 2 - Last Use / Amount: 01/12/2019. Name of Substance 3: Black and Milds. 3 - Age of First Use: UTA 3 - Amount (size/oz): Pt reported, smoking two Black and Milds, daily.  3 - Frequency: Ongoing.  3 - Duration: Daily.  3 - Last Use / Amount:  01/12/2019.  Sleep: Good  Appetite:  Good  Current Medications: Current Facility-Administered Medications  Medication Dose Route Frequency Provider Last Rate Last Dose  . acetaminophen (TYLENOL) tablet 650 mg  650 mg Oral Q6H PRN Laverle Hobby, PA-C   650 mg at 01/14/19 2112  . alum & mag hydroxide-simeth (MAALOX/MYLANTA) 200-200-20 MG/5ML suspension 30 mL  30 mL Oral Q4H PRN Patriciaann Clan E, PA-C      . ARIPiprazole (ABILIFY) tablet 15 mg  15 mg Oral Daily Sharma Covert, MD   15 mg at 01/15/19 7017  . gabapentin (NEURONTIN) capsule 800 mg  800 mg Oral TID Sharma Covert, MD   800 mg at 01/15/19 7939  . hydrOXYzine (ATARAX/VISTARIL) tablet 25 mg  25 mg Oral Q6H PRN Laverle Hobby, PA-C   25 mg at 01/14/19 2112  . magnesium hydroxide (MILK OF MAGNESIA) suspension 30 mL  30 mL Oral Daily PRN Patriciaann Clan E, PA-C      . metroNIDAZOLE (FLAGYL) tablet 500 mg  500 mg Oral Q12H Lindon Romp A, NP   500 mg at 01/15/19 0811  . nicotine (NICODERM CQ - dosed in mg/24 hours) patch 21 mg  21 mg Transdermal Daily Nwoko, Herbert Pun I, NP   21 mg at 01/15/19 0300  . nystatin (MYCOSTATIN/NYSTOP) topical powder   Topical BID Lindon Romp A, NP      . traZODone (DESYREL) tablet 100 mg  100 mg Oral QHS PRN Sharma Covert, MD   100 mg at 01/14/19 2112  . ziprasidone (GEODON) injection 20 mg  20 mg Intramuscular Once Sharma Covert, MD        Lab Results: No results found for this or any previous visit (from the past 48 hour(s)).  Blood Alcohol level:  Lab Results  Component Value Date   ETH <10 01/12/2019   ETH <10 92/33/0076    Metabolic Disorder Labs: No results found for: HGBA1C, MPG No results found for: PROLACTIN Lab Results  Component Value Date   CHOL 169 09/15/2017   TRIG 196 (H) 09/15/2017   HDL 53 09/15/2017   CHOLHDL 3.2 09/15/2017   LDLCALC 77 09/15/2017    Physical Findings: AIMS: Facial and Oral Movements Muscles of Facial Expression: None, normal Lips and  Perioral Area: None, normal Jaw: None, normal Tongue: None, normal,Extremity Movements Upper (arms, wrists, hands, fingers): None, normal Lower (legs, knees, ankles, toes): None, normal, Trunk Movements Neck, shoulders, hips: None, normal, Overall Severity Severity of abnormal movements (highest score from questions above): None, normal Incapacitation due to abnormal movements: None, normal Patient's awareness of abnormal movements (rate only patient's report): No Awareness, Dental Status Current problems with teeth and/or dentures?: No Does patient usually wear dentures?: No  CIWA:    COWS:  COWS Total Score:  1  Musculoskeletal: Strength & Muscle Tone: within normal limits Gait & Station: normal Patient leans: N/A  Psychiatric Specialty Exam: Physical Exam  Nursing note and vitals reviewed. Constitutional: She is oriented to person, place, and time. She appears well-developed and well-nourished.  HENT:  Head: Normocephalic.  Neck: Normal range of motion.  Cardiovascular: Normal rate.  Respiratory: Effort normal.  Musculoskeletal:        General: Tenderness present.  Neurological: She is alert and oriented to person, place, and time.  Psychiatric: Her speech is normal and behavior is normal. Thought content normal. Her mood appears anxious. Cognition and memory are normal. She expresses impulsivity. She exhibits a depressed mood.    Review of Systems  Psychiatric/Behavioral: Positive for depression and substance abuse. The patient is nervous/anxious.   All other systems reviewed and are negative.   Blood pressure (!) 158/89, pulse 60, temperature 97.8 F (36.6 C), temperature source Oral, resp. rate 16, height 5\' 8"  (1.727 m), weight 122.4 kg, last menstrual period 12/19/2018, SpO2 99 %, unknown if currently breastfeeding.Body mass index is 41.03 kg/m.  General Appearance: Casual  Eye Contact:  Fair  Speech:  Normal Rate  Volume:  Normal  Mood:  Depressed  Affect:   Congruent  Thought Process:  Coherent and Descriptions of Associations: Intact  Orientation:  Full (Time, Place, and Person)  Thought Content:  WDL and Logical  Suicidal Thoughts:  No  Homicidal Thoughts:  No  Memory:  Immediate;   Fair Recent;   Fair Remote;   Fair  Judgement:  Poor  Insight:  Lacking  Psychomotor Activity:  Normal  Concentration:  Concentration: Fair and Attention Span: Fair  Recall:  [employer] of Knowledge:  Fair  Language:  Good  Akathisia:  No  Handed:  Right  AIMS (if indicated):     Assets:  Leisure Time Physical Health Resilience  ADL's:  Intact  Cognition:  WNL  Sleep:  Number of Hours: 5.75    Treatment Plan Summary: Daily contact with patient to assess and evaluate symptoms and progress in treatment, Medication management and Plan major depressive disorder, recurrent, severe without psychosis:  -Continued Abilify 15 mg daily  Anxiety: -Continued gabapentin 800 mg TID -Continued hydroxyzine 25 mg every six hours PRN  Insomnia: -Continued Trazodone 100 mg at bedtime PRN  Safety: Will continue 15 minute observation for safety checks. Patient is able to contract for safety on the unit at this time  Labs: No new labs  Continue to develop treatment plan to decrease risk of relapse upon discharge and to reduce the need for readmission.  Psycho-social education regarding relapse prevention and self care.  Health care follow up as needed for medical problems.  Continue to attend and participate in therapy.  Waylan Boga, NP 01/15/2019, 10:10 AM
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
## 2019-01-15 NOTE — Progress Notes (Signed)
Nursing Progress Note: 7p-7a D: Pt currently presents with a depressed/anxious affect and behavior. Pt states "I was worried about my medications, but they are sorted now. Other than that I had a really good day." Interacting appropriately with the milieu. Pt reports good sleep during the previous night with current medication regimen. Pt did attend wrap-up group.  A: Pt provided with medications per providers orders. [name] labs and vitals were monitored throughout the night. Pt supported emotionally and encouraged to express concerns and questions. Pt educated on medications.  R: [name] safety ensured with 15 minute and environmental checks. Pt currently denies SI, HI, and AVH. Pt verbally contracts to seek staff if SI,HI, or AVH occurs and to consult with staff before acting on any harmful thoughts. Will continue to monitor.

## 2019-01-15 NOTE — Progress Notes (Signed)
[facility] MD Progress Note  01/15/2019 2:14 PM Natasha Long  MRN:  [id number]   Subjective:  Endorses depression and anxiety, denies suicidal ideations at this time  23 yo female admitted for suicidal ideations with a plan to cut herself.  Attended and participated in groups without issues.  No complaints of pain on assessment.  This afternoon, sedated and sleeping heavily--decreased her gabapentin from 800 mg TID to 400 mg due to this issue as it is hindering her participating in groups.  Principal Problem: Major depressive disorder, recurrent, severe without psychosis Diagnosis: Active Problems:   MDD (major depressive disorder), severe (HCC)  Total Time spent with patient: 45 minutes  Past Psychiatric History: bipolar affective disorder, borderline personality, substance abuse  Past Medical History:  Past Medical History:  Diagnosis Date  . Bipolar affective, manic ([place])   . Hypertension    diagnosed as child; stopped meds at 89 yo  . Hypoglycemia   . Insomnia   . Nephrotic syndrome   . Nephrotic syndrome   . PTSD (post-traumatic stress disorder)   . Renal disease     Past Surgical History:  Procedure Laterality Date  . CHOLECYSTECTOMY    . extraction of wisdom teeth    . RENAL BIOPSY     Family History:  Family History  Problem Relation Age of Onset  . Diabetes Other   . Hypertension Other    Family Psychiatric  History: none Social History:  Social History   Substance and Sexual Activity  Alcohol Use No     Social History   Substance and Sexual Activity  Drug Use Yes  . Types: Marijuana, Cocaine    Social History   Socioeconomic History  . Marital status: Single    Spouse name: Not on file  . Number of children: 1  . Years of education: Not on file  . Highest education level: 12th grade  Occupational History  . Not on file  Social Needs  . Financial resource strain: Patient refused  . Food insecurity:    Worry: Patient refused    Inability:  Patient refused  . Transportation needs:    Medical: Patient refused    Non-medical: Patient refused  Tobacco Use  . Smoking status: Light Tobacco Smoker    Types: Cigarettes  . Smokeless tobacco: Never Used  Substance and Sexual Activity  . Alcohol use: No  . Drug use: Yes    Types: Marijuana, Cocaine  . Sexual activity: Yes    Birth control/protection: None  Lifestyle  . Physical activity:    Days per week: Patient refused    Minutes per session: Patient refused  . Stress: Patient refused  Relationships  . Social connections:    Talks on phone: Patient refused    Gets together: Patient refused    Attends religious service: Patient refused    Active member of club or organization: Patient refused    Attends meetings of clubs or organizations: Patient refused    Relationship status: Patient refused  Other Topics Concern  . Not on file  Social History Narrative  . Not on file   Additional Social History:    Pain Medications: See MAR Prescriptions: See MAR Over the Counter: See MAR History of alcohol / drug use?: Yes Longest period of sobriety (when/how long): Monlh long for both substances Negative Consequences of Use: Financial, Personal relationships, Work / School Withdrawal Symptoms: Nausea / Vomiting Name of Substance 1: Marijuana 1 - Age of First Use: Per chart, "23 years  of age."  1 - Amount (size/oz): Pt reported, smoking a blunt today.  1 - Frequency: Per chart, "daily."  1 - Duration: Per chart, "past two years at this rate."  1 - Last Use / Amount: 01/12/2019. Name of Substance 2: Molly.  2 - Age of First Use: UTA 2 - Amount (size/oz): Pt reported, "4 g's, between today and yesterday."  2 - Frequency: Ongoing. 2 - Duration: UTA 2 - Last Use / Amount: 01/12/2019. Name of Substance 3: Black and Milds. 3 - Age of First Use: UTA 3 - Amount (size/oz): Pt reported, smoking two Black and Milds, daily.  3 - Frequency: Ongoing.  3 - Duration: Daily.  3 -  Last Use / Amount: 01/12/2019.  Sleep: Good  Appetite:  Good  Current Medications: Current Facility-Administered Medications  Medication Dose Route Frequency Provider Last Rate Last Dose  . acetaminophen (TYLENOL) tablet 650 mg  650 mg Oral Q6H PRN Laverle Hobby, PA-C   650 mg at 01/14/19 2112  . alum & mag hydroxide-simeth (MAALOX/MYLANTA) 200-200-20 MG/5ML suspension 30 mL  30 mL Oral Q4H PRN Patriciaann Clan E, PA-C      . ARIPiprazole (ABILIFY) tablet 15 mg  15 mg Oral Daily Sharma Covert, MD   15 mg at 01/15/19 3785  . gabapentin (NEURONTIN) capsule 400 mg  400 mg Oral TID Patrecia Pour, NP      . hydrOXYzine (ATARAX/VISTARIL) tablet 25 mg  25 mg Oral Q6H PRN Patriciaann Clan E, PA-C   25 mg at 01/15/19 1016  . magnesium hydroxide (MILK OF MAGNESIA) suspension 30 mL  30 mL Oral Daily PRN Patriciaann Clan E, PA-C      . metroNIDAZOLE (FLAGYL) tablet 500 mg  500 mg Oral Q12H Lindon Romp A, NP   500 mg at 01/15/19 0811  . nicotine (NICODERM CQ - dosed in mg/24 hours) patch 21 mg  21 mg Transdermal Daily Nwoko, Herbert Pun I, NP   21 mg at 01/15/19 8850  . nystatin (MYCOSTATIN/NYSTOP) topical powder   Topical BID Lindon Romp A, NP      . traZODone (DESYREL) tablet 100 mg  100 mg Oral QHS PRN Sharma Covert, MD   100 mg at 01/14/19 2112  . ziprasidone (GEODON) injection 20 mg  20 mg Intramuscular Once Sharma Covert, MD        Lab Results: No results found for this or any previous visit (from the past 48 hour(s)).  Blood Alcohol level:  Lab Results  Component Value Date   ETH <10 01/12/2019   ETH <10 27/74/1287    Metabolic Disorder Labs: No results found for: HGBA1C, MPG No results found for: PROLACTIN Lab Results  Component Value Date   CHOL 169 09/15/2017   TRIG 196 (H) 09/15/2017   HDL 53 09/15/2017   CHOLHDL 3.2 09/15/2017   LDLCALC 77 09/15/2017    Physical Findings: AIMS: Facial and Oral Movements Muscles of Facial Expression: None, normal Lips and  Perioral Area: None, normal Jaw: None, normal Tongue: None, normal,Extremity Movements Upper (arms, wrists, hands, fingers): None, normal Lower (legs, knees, ankles, toes): None, normal, Trunk Movements Neck, shoulders, hips: None, normal, Overall Severity Severity of abnormal movements (highest score from questions above): None, normal Incapacitation due to abnormal movements: None, normal Patient's awareness of abnormal movements (rate only patient's report): No Awareness, Dental Status Current problems with teeth and/or dentures?: No Does patient usually wear dentures?: No  CIWA:  CIWA-Ar Total: 1 COWS:  COWS Total  Score: 1  Musculoskeletal: Strength & Muscle Tone: within normal limits Gait & Station: normal Patient leans: N/A  Psychiatric Specialty Exam: Physical Exam  Nursing note and vitals reviewed. Constitutional: She is oriented to person, place, and time. She appears well-developed and well-nourished.  HENT:  Head: Normocephalic.  Neck: Normal range of motion.  Cardiovascular: Normal rate.  Respiratory: Effort normal.  Musculoskeletal:        General: Tenderness present.  Neurological: She is alert and oriented to person, place, and time.  Psychiatric: Her speech is normal and behavior is normal. Thought content normal. Her mood appears anxious. Cognition and memory are normal. She expresses impulsivity. She exhibits a depressed mood.    Review of Systems  Psychiatric/Behavioral: Positive for depression and substance abuse. The patient is nervous/anxious.   All other systems reviewed and are negative.   Blood pressure (!) 141/69, pulse 70, temperature 98.3 F (36.8 C), resp. rate 16, height 5\' 8"  (1.727 m), weight 122.4 kg, last menstrual period 12/19/2018, SpO2 99 %, unknown if currently breastfeeding.Body mass index is 41.03 kg/m.  General Appearance: Casual  Eye Contact:  Fair  Speech:  Normal Rate  Volume:  Normal  Mood:  Depressed  Affect:  Congruent   Thought Process:  Coherent and Descriptions of Associations: Intact  Orientation:  Full (Time, Place, and Person)  Thought Content:  WDL and Logical  Suicidal Thoughts:  No  Homicidal Thoughts:  No  Memory:  Immediate;   Fair Recent;   Fair Remote;   Fair  Judgement:  Poor  Insight:  Lacking  Psychomotor Activity:  Normal  Concentration:  Concentration: Fair and Attention Span: Fair  Recall:  [employer] of Knowledge:  Fair  Language:  Good  Akathisia:  No  Handed:  Right  AIMS (if indicated):     Assets:  Leisure Time Physical Health Resilience  ADL's:  Intact  Cognition:  WNL  Sleep:  Number of Hours: 5.75    Treatment Plan Summary: Daily contact with patient to assess and evaluate symptoms and progress in treatment, Medication management and Plan major depressive disorder, recurrent, severe without psychosis:  -Continued Abilify 15 mg daily  Anxiety: -Decreased gabapentin 400 mg TID due to sedation -Continued hydroxyzine 25 mg every six hours PRN  Insomnia: -Continued Trazodone 100 mg at bedtime PRN  Safety: Will continue 15 minute observation for safety checks. Patient is able to contract for safety on the unit at this time  Labs: No new labs  Continue to develop treatment plan to decrease risk of relapse upon discharge and to reduce the need for readmission.  Psycho-social education regarding relapse prevention and self care.  Health care follow up as needed for medical problems.  Continue to attend and participate in therapy.  Waylan Boga, NP 01/15/2019, 2:14 PM

## 2019-01-15 NOTE — Progress Notes (Signed)
Patient became very upset, yelling, screaming, flinging arms because her neurotin 800 mg had been decreased to 400 mg.  AC/charge nurse aware.  MD was called and neurotin was increased to 800 mg.  SW stated that she also heard patient stating threatening words to staff. Patient calmed down and went to dining room for dinner.

## 2019-01-15 NOTE — Progress Notes (Signed)
Patient did attend the evening speaker AA meeting.  

## 2019-01-15 NOTE — BHH Group Notes (Signed)
[facility] LCSW Group Therapy Note  01/15/2019  10:00-11:00AM  Type of Therapy and Topic:  Group Therapy:  Adding Supports Including Being Your Own Support  Participation Level:  None   Description of Group:  Patients in this group were introduced to the concept that additional supports including self-support are an essential part of recovery.  A song entitled "I Need Help!" was played and a group discussion was held in reaction to the idea of needing to add supports.  A song entitled "My Own Hero" was played and a group discussion ensued in which patients stated they could relate to the song and it inspired them to realize they have be willing to help themselves in order to succeed, because other people cannot achieve sobriety or stability for them.  We discussed adding a variety of healthy supports to address the various needs in their lives.  A song was played called "I Know Where I've Been" toward the end of group and used to conduct an inspirational wrap-up to group of remembering how far they have already come in their journey.  Therapeutic Goals: 1)  demonstrate the importance of being a part of one's own support system 2)  discuss reasons people in one's life may eventually be unable to be continually supportive  3)  identify the patient's current support system and   4)  elicit commitments to add healthy supports and to become more conscious of being self-supportive   Summary of Patient Progress:  The patient expressed at the outset of group that she wanted to "pass" which CSW told her was fine.  CSW did ask for her name so it could be recorded that she attended group and she stated "pass" again.  Then she wanted to ensure that CSW took no written notes on her during group.  She became agitated and left.   Therapeutic Modalities:   Motivational Interviewing Activity  Maretta Los

## 2019-01-16 DIAGNOSIS — F322 Major depressive disorder, single episode, severe without psychotic features: Secondary | ICD-10-CM

## 2019-01-16 MED ORDER — METRONIDAZOLE 500 MG PO TABS: 500.0000 mg | ORAL_TABLET | Freq: Two times a day (BID) | ORAL | 1 refills | Status: DC

## 2019-01-16 MED ORDER — GABAPENTIN 400 MG PO CAPS: 800.0000 mg | ORAL_CAPSULE | Freq: Three times a day (TID) | ORAL | 1 refills | Status: DC

## 2019-01-16 MED ORDER — ARIPIPRAZOLE 10 MG PO TABS: 10.0000 mg | ORAL_TABLET | Freq: Every day | ORAL | 1 refills | Status: DC

## 2019-01-16 NOTE — Discharge Summary (Signed)
Physician Discharge Summary Note  Patient:  Natasha Long is an 23 y.o., female MRN:  [id number] DOB:  [birth year] Patient phone:  [phone] (home)  Patient address:   [place] [address],  Total Time spent with patient: 45 minutes  Date of Admission:  01/13/2019 Date of Discharge: 01/16/19  Reason for Admission:   History of Present Illness: Patient is seen and examined. Patient is a 23 year old female with a past psychiatric history significant for borderline personality disorder, reportedly bipolar disorder, and polysubstance use disorders who presented to the [facility] emergency department on 2/13 with suicidal ideation. Patient stated that she had gone through a break-up, had lost the place where she was living, and was living in a shelter. Patient stated that she had broken up with her boyfriend back in December, and he had gone back to [place] 2 days after that. She is been in the shelter since that time. She is essentially homeless. She is from [place] but stated that her family does not support her. She was stranded in [place] 2 years ago. She admitted that she had been abusing substances, and been using "reckless sex". She also admitted to an addiction to "self-harm". She cut herself several weeks ago with a razor. She admitted to using [facility], marijuana, and her drug screen was positive for cocaine as well. Her last psychiatric hospitalization at our facility was on 11/15/2018. Her diagnosis at that time was borderline personality disorder, cocaine use disorder, PTSD and cannabis use disorder. She was discharged on Abilify, gabapentin, hydroxyzine and trazodone. She stated that the doctor at [facility] only does med management, and that her therapist there does not schedule her for follow-up appointments. She admitted to helplessness, hopelessness and worthlessness. She admitted to suicidal ideation. She was admitted to the  hospital for evaluation and stabilization Principal Problem: <principal problem not specified> Discharge Diagnoses: Active Problems:   MDD (major depressive disorder), severe ([place])   Past Psychiatric History: extensive  Past Medical History:  Past Medical History:  Diagnosis Date  . Bipolar affective, manic ([place])   . Hypertension    diagnosed as child; stopped meds at 60 yo  . Hypoglycemia   . Insomnia   . Nephrotic syndrome   . Nephrotic syndrome   . PTSD (post-traumatic stress disorder)   . Renal disease     Past Surgical History:  Procedure Laterality Date  . CHOLECYSTECTOMY    . extraction of wisdom teeth    . RENAL BIOPSY     Family History:  Family History  Problem Relation Age of Onset  . Diabetes Other   . Hypertension Other    Social History:  Social History   Substance and Sexual Activity  Alcohol Use No     Social History   Substance and Sexual Activity  Drug Use Yes  . Types: Marijuana, Cocaine    Social History   Socioeconomic History  . Marital status: Single    Spouse name: Not on file  . Number of children: 1  . Years of education: Not on file  . Highest education level: 12th grade  Occupational History  . Not on file  Social Needs  . Financial resource strain: Patient refused  . Food insecurity:    Worry: Patient refused    Inability: Patient refused  . Transportation needs:    Medical: Patient refused    Non-medical: Patient refused  Tobacco Use  . Smoking status: Light Tobacco Smoker  Types: Cigarettes  . Smokeless tobacco: Never Used  Substance and Sexual Activity  . Alcohol use: No  . Drug use: Yes    Types: Marijuana, Cocaine  . Sexual activity: Yes    Birth control/protection: None  Lifestyle  . Physical activity:    Days per week: Patient refused    Minutes per session: Patient refused  . Stress: Patient refused  Relationships  . Social connections:    Talks on phone: Patient refused    Gets together: Patient  refused    Attends religious service: Patient refused    Active member of club or organization: Patient refused    Attends meetings of clubs or organizations: Patient refused    Relationship status: Patient refused  Other Topics Concern  . Not on file  Social History Narrative  . Not on file    Hospital Course:    Patient was readmitted to the service she is well-known to the service has a long history of cocaine dependency, cannabis dependency, homeless, severe personality disorder, and related affective instability There was some degree of staff splitting she had requested different providers and she was irritable at times but overall she did comply with medications, was continued on aripiprazole as her primary mood stabilizer/antipsychotic therapy. By the date of the 17th she was requesting discharge she was going to stay at the shelter she was alert, oriented, cooperative with me denies wanting to harm self or others could contract fully, no acute psychosis no mania-no involuntary movements. Stable for discharge home  Physical Findings: AIMS: Facial and Oral Movements Muscles of Facial Expression: None, normal Lips and Perioral Area: None, normal Jaw: None, normal Tongue: None, normal,Extremity Movements Upper (arms, wrists, hands, fingers): None, normal Lower (legs, knees, ankles, toes): None, normal, Trunk Movements Neck, shoulders, hips: None, normal, Overall Severity Severity of abnormal movements (highest score from questions above): None, normal Incapacitation due to abnormal movements: None, normal Patient's awareness of abnormal movements (rate only patient's report): No Awareness, Dental Status Current problems with teeth and/or dentures?: No Does patient usually wear dentures?: No  CIWA:  CIWA-Ar Total: 1 COWS:  COWS Total Score: 1  Musculoskeletal: Strength & Muscle Tone: within normal limits Gait & Station: normal Patient leans: N/A  Psychiatric Specialty  Exam: Physical Exam  ROS  Blood pressure (!) 141/69, pulse 70, temperature 98.3 F (36.8 C), resp. rate 16, height 5\' 8"  (1.727 m), weight 122.4 kg, last menstrual period 12/19/2018, SpO2 99 %, unknown if currently breastfeeding.Body mass index is 41.03 kg/m.  General Appearance: Casual  Eye Contact:  Minimal  Speech:  Slow  Volume:  Decreased  Mood:  Euthymic  Affect:  Full Range  Thought Process:  Goal Directed  Orientation:  Full (Time, Place, and Person)  Thought Content:  Logical  Suicidal Thoughts:  No  Homicidal Thoughts:  No  Memory:  Immediate;   Good  Judgement:  Good  Insight:  Fair  Psychomotor Activity:  Psychomotor Retardation  Concentration:  Concentration: Fair  Recall:  [place] of Knowledge:  Fair  Language:  Fair  Akathisia:  Negative  Handed:  Right  AIMS (if indicated):     Assets:  Resilience  ADL's:  Intact  Cognition:  WNL  Sleep:  Number of Hours: 6     Have you used any form of tobacco in the last 30 days? (Cigarettes, Smokeless Tobacco, Cigars, and/or Pipes): Yes  Has this patient used any form of tobacco in the last 30 days? (Cigarettes, Smokeless  Tobacco, Cigars, and/or Pipes) Yes, No  Blood Alcohol level:  Lab Results  Component Value Date   ETH <10 01/12/2019   ETH <10 16/08/9603    Metabolic Disorder Labs:  No results found for: HGBA1C, MPG No results found for: PROLACTIN Lab Results  Component Value Date   CHOL 169 09/15/2017   TRIG 196 (H) 09/15/2017   HDL 53 09/15/2017   CHOLHDL 3.2 09/15/2017   LDLCALC 77 09/15/2017    See Psychiatric Specialty Exam and Suicide Risk Assessment completed by Attending Physician prior to discharge.  Discharge destination:  Home  Is patient on multiple antipsychotic therapies at discharge:  No   Has Patient had three or more failed trials of antipsychotic monotherapy by history:  No  Recommended Plan for Multiple Antipsychotic Therapies: NA   Allergies as of 01/16/2019       Reactions   Prednisone Other (See Comments)   Pt states that this med caused pancreatitis.    Prozac [fluoxetine Hcl] Other (See Comments)   Panic attack    Wellbutrin [bupropion] Other (See Comments)   Panic attack      Medication List    STOP taking these medications   gabapentin 800 MG tablet Commonly known as:  NEURONTIN Replaced by:  gabapentin 400 MG capsule   hydrOXYzine 25 MG tablet Commonly known as:  ATARAX/VISTARIL   traZODone 100 MG tablet Commonly known as:  DESYREL     TAKE these medications     Indication  ARIPiprazole 10 MG tablet Commonly known as:  ABILIFY Take 1 tablet (10 mg total) by mouth at bedtime. What changed:    medication strength  how much to take  when to take this  Indication:  Manic Phase of Manic-Depression   gabapentin 400 MG capsule Commonly known as:  NEURONTIN Take 2 capsules (800 mg total) by mouth 3 (three) times daily. Replaces:  gabapentin 800 MG tablet  Indication:  Abuse or Misuse of Alcohol   metroNIDAZOLE 500 MG tablet Commonly known as:  FLAGYL Take 1 tablet (500 mg total) by mouth every 12 (twelve) hours.  Indication:  Inflammation of the Endometrium   naproxen 500 MG tablet Commonly known as:  NAPROSYN Take 1 tablet (500 mg total) by mouth 2 (two) times daily.       Follow-up [place], Dahari, MD.   Specialty:  Orthopedic Surgery Contact information: [address] [place] [place] [address] [phone]        [facility] Follow up.   Specialty:  Behavioral Health Why:  appt needed Contact information: [place] [address] [phone]           Follow-up recommendations:  Activity:  full  SignedJohnn Hai, MD 01/16/2019, 10:38 AM

## 2019-01-16 NOTE — Progress Notes (Signed)
Pt attended spiritual care group on grief and loss facilitated by chaplain Jerene Pitch and PhD Counseling intern Kerry Hough  Group opened with brief discussion and psycho-social ed around grief and loss in relationships and in relation to self - identifying life patterns, circumstances, changes that cause losses. Established group norm of speaking from own life experience. Group goal of establishing open and affirming space for members to share loss and experience with grief, normalize grief experience and provide psycho social education and grief support.  [place] arrived at end of group.  Did not engage in group discussion     Jerene Pitch, MDiv, [facility]

## 2019-01-16 NOTE — BHH Suicide Risk Assessment (Signed)
[facility] Discharge Suicide Risk Assessment   Principal Problem: History of bipolar disorder, emotional dyscontrol, punching a wall with broken hand so forth Discharge Diagnoses: Active Problems:   MDD (major depressive disorder), severe ([place])   Total Time spent with patient: 45 minutes  Present is alert oriented cooperative not irritable generally denies wanting to harm self or others and ready for discharge by her report  Mental Status Per Nursing Assessment::   On Admission:  NA  Demographic Factors:  Low socioeconomic status  Loss Factors: Decrease in vocational status  Historical Factors: Impulsivity  Risk Reduction Factors:   Religious beliefs about death  Continued Clinical Symptoms:  Dysthymia  Cognitive Features That Contribute To Risk:  Polarized thinking    Suicide Risk:  Minimal: No identifiable suicidal ideation.  Patients presenting with no risk factors but with morbid ruminations; may be classified as minimal risk based on the severity of the depressive symptoms  Follow-up [place], Dahari, MD.   Specialty:  Orthopedic Surgery Contact information: [address] [place] [address] [place] [address] [phone]        [facility] Follow up.   Specialty:  Behavioral Health Why:  appt needed Contact information: [place] [address] [phone]           Plan Of Care/Follow-up recommendations:  Activity:  full  Johnte Portnoy, MD 01/16/2019, 10:32 AM

## 2019-01-16 NOTE — Progress Notes (Signed)
Patient wants to be discharged with flagyl prescription/medication.

## 2019-01-16 NOTE — Progress Notes (Signed)
Recreation Therapy Notes  Date:  2.17.20 Time: 0930 Location: [address] Dayroom  Group Topic: Stress Management  Goal Area(s) Addresses:  Patient will identify positive stress management techniques. Patient will identify benefits of using stress management post d/c.  Intervention:  Stress Management  Activity :  Meditation.  LRT introduced the stress management technique of meditation.  LRT played a meditation that focused on impermanence.  Patients were to listen and follow as meditation played to engaged in the activity.   Education:  Stress Management, Discharge Planning.   Education Outcome: Acknowledges Education  Clinical Observations/Feedback: Pt did not attend group.     Victorino Sparrow, LRT/CTRS         Ria Comment, Atleigh Gruen A 01/16/2019 11:07 AM

## 2019-01-16 NOTE — Progress Notes (Signed)
Discharge Note:  Patient discharged.  Patient denied SI and HI.  Denied A/V hallucinations.  Denied pain.  Suicide prevention information given and discussed with patient who stated she understood and had no questions.  My3 app information also given to patient.  Patient also received survey information to fill out.  Patient stated she received all her belongings, clothing, toiletries, misc items, prescriptions, etc.  Patient stated she appreciated all assistance received from [facility] staff.  All required discharge information given to patient at discharge.

## 2019-01-16 NOTE — Progress Notes (Signed)
Patient denied SI and HI, contracts for safety.  Denied A/V hallucinations.  Denied pain. Medications administered per MD orders.  Emotional support and encouragement given patient. Safety maintained with 15 minute checks.  Patient apologized for her behavior yesterday afternoon.  Nurse encouraged patient to call community colleges and ask about scholarships, grants to return to school, which patient wants to do.

## 2019-01-16 NOTE — Progress Notes (Signed)
  [facility] Adult Case Management Discharge Plan :  Will you be returning to the same living situation after discharge:  Yes,  pt plans to [facility] At discharge, do you have transportation home?: Yes,  bus pass provided.  Do you have the ability to pay for your medications: Yes,  Medicaid  Release of information consent forms completed and submitted to medical records by CSW.  Patient to Follow up at: Follow-up Information    Melina Schools, MD Follow up.   Specialty:  Orthopedic Surgery Why:  Please call after discharge to schedule follow-up appt for hand.  Contact information: [address] [place] [address] [phone]        [facility] Follow up on 01/24/2019.   Specialty:  Behavioral Health Why:  Hospital follow up appointment with Christia Reading is Tuesday, 2/25 at 8:15a. Please bring your photo ID, proof of insurance, current medications and discharge paperwork from this hospitalization.  Contact information: [place] [address] [phone]           Next level of care provider has access to [place] and Suicide Prevention discussed: Yes,  SPE completed with pt; pt declined to consent to collateral contact. SPI pamphlet and mobile crisis information provided.   Have you used any form of tobacco in the last 30 days? (Cigarettes, Smokeless Tobacco, Cigars, and/or Pipes): Yes  Has patient been referred to the Quitline?: Patient refused referral  Patient has been referred for addiction treatment: Yes  Avelina Laine, LCSW 01/16/2019, 11:26 AM

## 2019-01-18 ENCOUNTER — Other Ambulatory Visit: Payer: Self-pay | Admitting: Critical Care Medicine

## 2019-01-18 ENCOUNTER — Encounter: Payer: Self-pay | Admitting: Critical Care Medicine

## 2019-01-18 MED ORDER — NAPROXEN 500 MG PO TABS: 500.0000 mg | ORAL_TABLET | Freq: Two times a day (BID) | ORAL | 0 refills | Status: DC

## 2019-01-18 MED ORDER — GABAPENTIN 400 MG PO CAPS: 800.0000 mg | ORAL_CAPSULE | Freq: Three times a day (TID) | ORAL | 1 refills | Status: DC

## 2019-01-18 MED ORDER — METRONIDAZOLE 500 MG PO TABS: 500.0000 mg | ORAL_TABLET | Freq: Two times a day (BID) | ORAL | 1 refills | Status: DC

## 2019-01-18 MED ORDER — ARIPIPRAZOLE 10 MG PO TABS: 10.0000 mg | ORAL_TABLET | Freq: Every day | ORAL | 1 refills | Status: DC

## 2019-01-18 NOTE — Progress Notes (Signed)
fx hand   Has [place] Medicaid    Cannot afford the copay  Needs meds from psych d/c :  Naprosyn/flagyl/gaba/abilify  Propanolol and trazadone has
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
## 2019-02-08 ENCOUNTER — Inpatient Hospital Stay (HOSPITAL_COMMUNITY)
Admission: AD | Admit: 2019-02-08 | Discharge: 2019-02-14 | DRG: 885 | Disposition: A | Discharge status: Home / Self Care | Payer: Medicaid Other | Source: Intra-hospital | Attending: Psychiatry | Admitting: Psychiatry

## 2019-02-08 ENCOUNTER — Encounter (HOSPITAL_COMMUNITY): Payer: Self-pay | Admitting: *Deleted

## 2019-02-08 ENCOUNTER — Emergency Department (HOSPITAL_COMMUNITY)
Admission: EM | Admit: 2019-02-08 | Discharge: 2019-02-08 | Disposition: A | Discharge status: Other Acute Inpatient Hospital | Payer: Medicaid Other | Attending: Emergency Medicine | Admitting: Emergency Medicine

## 2019-02-08 ENCOUNTER — Emergency Department (HOSPITAL_COMMUNITY): Payer: Medicaid Other

## 2019-02-08 ENCOUNTER — Other Ambulatory Visit: Payer: Self-pay

## 2019-02-08 DIAGNOSIS — Z915 Personal history of self-harm: Secondary | ICD-10-CM

## 2019-02-08 DIAGNOSIS — Z79899 Other long term (current) drug therapy: Secondary | ICD-10-CM | POA: Diagnosis not present

## 2019-02-08 DIAGNOSIS — Z6841 Body Mass Index (BMI) 40.0 and over, adult: Secondary | ICD-10-CM | POA: Diagnosis not present

## 2019-02-08 DIAGNOSIS — F1994 Other psychoactive substance use, unspecified with psychoactive substance-induced mood disorder: Secondary | ICD-10-CM | POA: Diagnosis present

## 2019-02-08 DIAGNOSIS — Z008 Encounter for other general examination: Secondary | ICD-10-CM | POA: Insufficient documentation

## 2019-02-08 DIAGNOSIS — F14188 Cocaine abuse with other cocaine-induced disorder: Secondary | ICD-10-CM | POA: Diagnosis not present

## 2019-02-08 DIAGNOSIS — Z833 Family history of diabetes mellitus: Secondary | ICD-10-CM | POA: Diagnosis not present

## 2019-02-08 DIAGNOSIS — Z590 Homelessness: Secondary | ICD-10-CM | POA: Diagnosis not present

## 2019-02-08 DIAGNOSIS — F609 Personality disorder, unspecified: Secondary | ICD-10-CM | POA: Diagnosis present

## 2019-02-08 DIAGNOSIS — X58XXXD Exposure to other specified factors, subsequent encounter: Secondary | ICD-10-CM | POA: Insufficient documentation

## 2019-02-08 DIAGNOSIS — E669 Obesity, unspecified: Secondary | ICD-10-CM | POA: Diagnosis present

## 2019-02-08 DIAGNOSIS — Z8249 Family history of ischemic heart disease and other diseases of the circulatory system: Secondary | ICD-10-CM

## 2019-02-08 DIAGNOSIS — R45851 Suicidal ideations: Secondary | ICD-10-CM | POA: Diagnosis present

## 2019-02-08 DIAGNOSIS — G4700 Insomnia, unspecified: Secondary | ICD-10-CM | POA: Diagnosis present

## 2019-02-08 DIAGNOSIS — F4312 Post-traumatic stress disorder, chronic: Secondary | ICD-10-CM | POA: Diagnosis present

## 2019-02-08 DIAGNOSIS — Z888 Allergy status to other drugs, medicaments and biological substances status: Secondary | ICD-10-CM | POA: Diagnosis not present

## 2019-02-08 DIAGNOSIS — F3489 Other specified persistent mood disorders: Secondary | ICD-10-CM | POA: Diagnosis not present

## 2019-02-08 DIAGNOSIS — F1420 Cocaine dependence, uncomplicated: Secondary | ICD-10-CM | POA: Diagnosis present

## 2019-02-08 DIAGNOSIS — F1910 Other psychoactive substance abuse, uncomplicated: Secondary | ICD-10-CM | POA: Insufficient documentation

## 2019-02-08 DIAGNOSIS — Z9049 Acquired absence of other specified parts of digestive tract: Secondary | ICD-10-CM

## 2019-02-08 DIAGNOSIS — F17210 Nicotine dependence, cigarettes, uncomplicated: Secondary | ICD-10-CM | POA: Diagnosis present

## 2019-02-08 DIAGNOSIS — F1210 Cannabis abuse, uncomplicated: Secondary | ICD-10-CM | POA: Diagnosis present

## 2019-02-08 DIAGNOSIS — F603 Borderline personality disorder: Secondary | ICD-10-CM | POA: Diagnosis not present

## 2019-02-08 DIAGNOSIS — F332 Major depressive disorder, recurrent severe without psychotic features: Principal | ICD-10-CM | POA: Diagnosis present

## 2019-02-08 DIAGNOSIS — S6292XD Unspecified fracture of left wrist and hand, subsequent encounter for fracture with routine healing: Secondary | ICD-10-CM | POA: Insufficient documentation

## 2019-02-08 DIAGNOSIS — I10 Essential (primary) hypertension: Secondary | ICD-10-CM | POA: Diagnosis present

## 2019-02-08 DIAGNOSIS — F3189 Other bipolar disorder: Secondary | ICD-10-CM | POA: Diagnosis not present

## 2019-02-08 DIAGNOSIS — F411 Generalized anxiety disorder: Secondary | ICD-10-CM | POA: Diagnosis present

## 2019-02-08 DIAGNOSIS — N179 Acute kidney failure, unspecified: Secondary | ICD-10-CM | POA: Insufficient documentation

## 2019-02-08 LAB — CBC WITH DIFFERENTIAL/PLATELET
ABS IMMATURE GRANULOCYTES: 0.04 10*3/uL (ref 0.00–0.07)
BASOS PCT: 0 %
Basophils Absolute: 0 10*3/uL (ref 0.0–0.1)
Eosinophils Absolute: 0.2 10*3/uL (ref 0.0–0.5)
Eosinophils Relative: 1 %
HCT: 46 % (ref 36.0–46.0)
Hemoglobin: 14.6 g/dL (ref 12.0–15.0)
Immature Granulocytes: 0 %
Lymphocytes Relative: 19 %
Lymphs Abs: 2.2 10*3/uL (ref 0.7–4.0)
MCH: 29.9 pg (ref 26.0–34.0)
MCHC: 31.7 g/dL (ref 30.0–36.0)
MCV: 94.3 fL (ref 80.0–100.0)
Monocytes Absolute: 0.9 10*3/uL (ref 0.1–1.0)
Monocytes Relative: 8 %
NEUTROS ABS: 8.3 10*3/uL — AB (ref 1.7–7.7)
Neutrophils Relative %: 72 %
PLATELETS: 214 10*3/uL (ref 150–400)
RBC: 4.88 MIL/uL (ref 3.87–5.11)
RDW: 13.2 % (ref 11.5–15.5)
WBC: 11.6 10*3/uL — ABNORMAL HIGH (ref 4.0–10.5)
nRBC: 0 % (ref 0.0–0.2)

## 2019-02-08 LAB — COMPREHENSIVE METABOLIC PANEL
ALBUMIN: 3.2 g/dL — AB (ref 3.5–5.0)
ALT: 22 U/L (ref 0–44)
AST: 20 U/L (ref 15–41)
Alkaline Phosphatase: 49 U/L (ref 38–126)
Anion gap: 8 (ref 5–15)
BUN: 12 mg/dL (ref 6–20)
CO2: 27 mmol/L (ref 22–32)
Calcium: 8.7 mg/dL — ABNORMAL LOW (ref 8.9–10.3)
Chloride: 107 mmol/L (ref 98–111)
Creatinine, Ser: 1.3 mg/dL — ABNORMAL HIGH (ref 0.44–1.00)
GFR calc Af Amer: >60 mL/min (ref >60)
GFR calc non Af Amer: 57 mL/min — ABNORMAL LOW (ref >60)
Glucose, Bld: 98 mg/dL (ref 70–99)
Potassium: 3.9 mmol/L (ref 3.5–5.1)
Sodium: 142 mmol/L (ref 135–145)
Total Bilirubin: 0.5 mg/dL (ref 0.3–1.2)
Total Protein: 6.7 g/dL (ref 6.5–8.1)

## 2019-02-08 LAB — I-STAT BETA HCG BLOOD, ED (MC, WL, AP ONLY): I-stat hCG, quantitative: <5 m[IU]/mL (ref <5)

## 2019-02-08 LAB — ACETAMINOPHEN LEVEL: Acetaminophen (Tylenol), Serum: <10 ug/mL — ABNORMAL LOW (ref 10–30)

## 2019-02-08 LAB — RAPID URINE DRUG SCREEN, HOSP PERFORMED
Amphetamines: NOT DETECTED
Barbiturates: NOT DETECTED
Benzodiazepines: NOT DETECTED
Cocaine: POSITIVE — AB
Opiates: NOT DETECTED
Tetrahydrocannabinol: POSITIVE — AB

## 2019-02-08 LAB — SALICYLATE LEVEL: Salicylate Lvl: <7 mg/dL (ref 2.8–30.0)

## 2019-02-08 LAB — ETHANOL: Alcohol, Ethyl (B): <10 mg/dL (ref <10)

## 2019-02-08 MED ORDER — TRAZODONE HCL 100 MG PO TABS
100.0000 mg | ORAL_TABLET | Freq: Every day | ORAL | Status: DC
  Filled 2019-02-08: qty 1

## 2019-02-08 MED ORDER — ZIPRASIDONE HCL 20 MG PO CAPS
20.0000 mg | ORAL_CAPSULE | Freq: Once | ORAL | Status: AC
  Administered 2019-02-08: 20 mg via ORAL
  Filled 2019-02-08 (×2): qty 1

## 2019-02-08 MED ORDER — GABAPENTIN 400 MG PO CAPS
800.0000 mg | ORAL_CAPSULE | Freq: Three times a day (TID) | ORAL | Status: DC
  Administered 2019-02-09 – 2019-02-13 (×14): 800 mg via ORAL
  Filled 2019-02-08 (×19): qty 2

## 2019-02-08 MED ORDER — PROPRANOLOL HCL 10 MG PO TABS
10.0000 mg | ORAL_TABLET | Freq: Two times a day (BID) | ORAL | Status: DC
  Filled 2019-02-08 (×3): qty 1

## 2019-02-08 MED ORDER — TRAZODONE HCL 100 MG PO TABS
100.0000 mg | ORAL_TABLET | Freq: Every day | ORAL | Status: DC
  Filled 2019-02-08 (×2): qty 1

## 2019-02-08 MED ORDER — SODIUM CHLORIDE 0.9 % IV BOLUS (SEPSIS)
1000.0000 mL | Freq: Once | INTRAVENOUS | Status: AC
  Administered 2019-02-08: 1000 mL via INTRAVENOUS

## 2019-02-08 MED ORDER — TRAZODONE HCL 50 MG PO TABS: 50.0000 mg | ORAL_TABLET | Freq: Every evening | ORAL | Status: DC | PRN

## 2019-02-08 MED ORDER — ARIPIPRAZOLE 10 MG PO TABS
10.0000 mg | ORAL_TABLET | Freq: Every day | ORAL | Status: DC
  Filled 2019-02-08: qty 1

## 2019-02-08 MED ORDER — MAGNESIUM HYDROXIDE 400 MG/5ML PO SUSP: 30.0000 mL | Freq: Every day | ORAL | Status: DC | PRN

## 2019-02-08 MED ORDER — PROPRANOLOL HCL 10 MG PO TABS
10.0000 mg | ORAL_TABLET | Freq: Two times a day (BID) | ORAL | Status: DC
  Administered 2019-02-08 – 2019-02-13 (×11): 10 mg via ORAL
  Filled 2019-02-08 (×5): qty 1
  Filled 2019-02-08: qty 28
  Filled 2019-02-08 (×3): qty 1
  Filled 2019-02-08: qty 28
  Filled 2019-02-08 (×7): qty 1

## 2019-02-08 MED ORDER — ARIPIPRAZOLE 10 MG PO TABS
10.0000 mg | ORAL_TABLET | Freq: Every day | ORAL | Status: DC
  Filled 2019-02-08 (×2): qty 1

## 2019-02-08 MED ORDER — ACETAMINOPHEN 325 MG PO TABS
650.0000 mg | ORAL_TABLET | Freq: Four times a day (QID) | ORAL | Status: DC | PRN
  Administered 2019-02-11: 650 mg via ORAL
  Filled 2019-02-08 (×2): qty 2

## 2019-02-08 MED ORDER — HYDROXYZINE HCL 25 MG PO TABS
25.0000 mg | ORAL_TABLET | Freq: Three times a day (TID) | ORAL | Status: DC | PRN
  Administered 2019-02-12 – 2019-02-13 (×2): 25 mg via ORAL
  Filled 2019-02-08: qty 1
  Filled 2019-02-08: qty 20
  Filled 2019-02-08 (×3): qty 1

## 2019-02-08 MED ORDER — ALUM & MAG HYDROXIDE-SIMETH 200-200-20 MG/5ML PO SUSP: 30.0000 mL | ORAL | Status: DC | PRN

## 2019-02-08 MED ORDER — NICOTINE 21 MG/24HR TD PT24
21.0000 mg | MEDICATED_PATCH | Freq: Every day | TRANSDERMAL | Status: DC
  Administered 2019-02-09 – 2019-02-10 (×2): 21 mg via TRANSDERMAL
  Filled 2019-02-08 (×5): qty 1
  Filled 2019-02-08: qty 14
  Filled 2019-02-08 (×2): qty 1

## 2019-02-08 MED ORDER — GABAPENTIN 400 MG PO CAPS
800.0000 mg | ORAL_CAPSULE | Freq: Three times a day (TID) | ORAL | Status: DC
  Administered 2019-02-08 (×2): 800 mg via ORAL
  Filled 2019-02-08 (×3): qty 2

## 2019-02-08 MED ORDER — HALOPERIDOL 5 MG PO TABS
5.0000 mg | ORAL_TABLET | Freq: Once | ORAL | Status: DC
  Filled 2019-02-08 (×2): qty 1

## 2019-02-08 NOTE — Progress Notes (Signed)
Natasha Long is a 23 year old female pt admitted on voluntary basis. On admission, she endorses that she has been feeling depressed and has recently thought about overdosing on her medications. She reports that she goes to [facility] for medications but states that she has not been taking them for the past couple of weeks and instead has been abusing cocaine. She reports that she received an Abilify maintaina injection over a month ago and is past due. She does have an ACE wrap with splint to left wrist and forearm area on admission. She reports that she recently broke-up with boyfriend, is living at a shelter currently and reports that she would like to go to a 30 day treatment program for substance abuse upon discharge. Natasha Long was cooperative on admission, was oriented to the milieu and safety maintained.

## 2019-02-08 NOTE — ED Notes (Signed)
Patriciaann Clan, NP, patient meets inpatient criteria. TTS to secure placement. RN informed of disposition.

## 2019-02-08 NOTE — Progress Notes (Signed)
Orthopedic Tech Progress Note Patient Details:  Natasha Long 1996/01/19 [id number]  1st splint applied on Feb 14th by [facility] ED. New splint applied today 3/11 due to fracture not completely healed, possibly worse than what it was before.  Ortho Devices Type of Ortho Device: Ace wrap, Short arm splint, Ulna gutter splint Splint Material: Plaster Ortho Device/Splint Interventions: Application   Post Interventions Patient Tolerated: Well Instructions Provided: Adjustment of device, Care of device   Staci Righter 02/08/2019, 2:47 PM

## 2019-02-08 NOTE — Tx Team (Signed)
Initial Treatment Plan 02/08/2019 6:23 PM [name] RRN:[id number]    PATIENT STRESSORS: Financial difficulties Loss of relationship with boyfriend Substance abuse   PATIENT STRENGTHS: Ability for insight Average or above average intelligence Capable of independent living General fund of knowledge Motivation for treatment/growth   PATIENT IDENTIFIED PROBLEMS: Depression Suicidal thoughts Substance abuse "I've not been taking my medicine"                     DISCHARGE CRITERIA:  Ability to meet basic life and health needs Improved stabilization in mood, thinking, and/or behavior Reduction of life-threatening or endangering symptoms to within safe limits Verbal commitment to aftercare and medication compliance  PRELIMINARY DISCHARGE PLAN: Attend aftercare/continuing care group  PATIENT/FAMILY INVOLVEMENT: This treatment plan has been presented to and reviewed with the patient, Natasha Long, and/or family member, .  The patient and family have been given the opportunity to ask questions and make suggestions.  Natasha Long, [place], [place] 02/08/2019, 6:23 PM

## 2019-02-08 NOTE — ED Notes (Signed)
Patient calm and cooperative at this time.  °

## 2019-02-08 NOTE — Progress Notes (Signed)
Pt off the unit.

## 2019-02-08 NOTE — ED Notes (Signed)
Pt presents with SI x 48 hours, plan to OD on Neurontin or cut self.  Pt reports she has been a cutter since the age of 23 yrs old.  Denies HI or AVH.  Feeling hopeless after breakup from significant other.  Pt reports she currently resides at [facility].  Boxers Fracture noted to Left hand, splint and acewrap in place.  Ortho tech called to place different dressing on Left hand.  Monitoring for safety, Q 15 min checks in effect.

## 2019-02-08 NOTE — ED Notes (Signed)
Patient wanded and moved to 23. Belongings taken with her, 2 purple suitcases, a jacket, brown back pack, brown leather shoes and a bag with the clothes she was wearing last night.

## 2019-02-08 NOTE — Progress Notes (Signed)
Staff went in the room with the pt to relief the 1:1 staff. Pt asked staff why are you staring at me. Staff reply I am not starring at you. I am looking at you. Pt became angry and start marching up the hall. Pt said where is my damn med tech? RN notified
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
## 2019-02-08 NOTE — ED Provider Notes (Signed)
TIME SEEN: 2:28 AM  CHIEF COMPLAINT: Suicidal thoughts  HPI: Patient is a 23 year old female with history of bipolar disorder, hypertension, nephrotic syndrome who presents to the emergency department with suicidal thoughts for the past week.  States she plans to overdose on her pills.  No HI or hallucinations.  States she has been using cocaine.  Also endorses drinking alcohol recently.  Denies any medical complaints.  ROS: See HPI Constitutional: no fever  Eyes: no drainage  ENT: no runny nose   Cardiovascular:  no chest pain  Resp: no SOB  GI: no vomiting GU: no dysuria Integumentary: no rash  Allergy: no hives  Musculoskeletal: no leg swelling  Neurological: no slurred speech ROS otherwise negative  PAST MEDICAL HISTORY/PAST SURGICAL HISTORY:  Past Medical History:  Diagnosis Date  . Bipolar affective, manic ([place])   . Hypertension    diagnosed as child; stopped meds at 82 yo  . Hypoglycemia   . Insomnia   . Nephrotic syndrome   . Nephrotic syndrome   . PTSD (post-traumatic stress disorder)   . Renal disease     MEDICATIONS:  Prior to Admission medications   Medication Sig Start Date End Date Taking? Authorizing Provider  ARIPiprazole (ABILIFY) 10 MG tablet Take 1 tablet (10 mg total) by mouth at bedtime. 01/18/19   [name], MD  gabapentin (NEURONTIN) 400 MG capsule Take 2 capsules (800 mg total) by mouth 3 (three) times daily. 01/18/19   [name], MD  metroNIDAZOLE (FLAGYL) 500 MG tablet Take 1 tablet (500 mg total) by mouth every 12 (twelve) hours. 01/18/19   [name], MD  naproxen (NAPROSYN) 500 MG tablet Take 1 tablet (500 mg total) by mouth 2 (two) times daily. 01/18/19   [name], MD  propranolol (INDERAL) 10 MG tablet Take 10 mg by mouth 2 (two) times daily.    [provider]  traZODone (DESYREL) 100 MG tablet Take 100 mg by mouth at bedtime.    [provider]    ALLERGIES:  Allergies  Allergen Reactions  .  Prednisone Other (See Comments)    Pt states that this med caused pancreatitis.   . Prozac [Fluoxetine Hcl] Other (See Comments)    Panic attack   . Wellbutrin [Bupropion] Other (See Comments)    Panic attack    SOCIAL HISTORY:  Social History   Tobacco Use  . Smoking status: Light Tobacco Smoker    Types: Cigarettes  . Smokeless tobacco: Never Used  Substance Use Topics  . Alcohol use: No    FAMILY HISTORY: Family History  Problem Relation Age of Onset  . Diabetes Other   . Hypertension Other     EXAM: BP 129/79 (BP Location: Right Arm)   Pulse 79   Temp 98.9 F (37.2 C) (Oral)   Resp 18   SpO2 100%  CONSTITUTIONAL: Alert and oriented and responds appropriately to questions. Well-appearing; well-nourished, obese, no distress HEAD: Normocephalic EYES: Conjunctivae clear, pupils appear equal, EOMI ENT: normal nose; moist mucous membranes NECK: Supple, no meningismus, no nuchal rigidity, no LAD  CARD: RRR; S1 and S2 appreciated; no murmurs, no clicks, no rubs, no gallops RESP: Normal chest excursion without splinting or tachypnea; breath sounds clear and equal bilaterally; no wheezes, no rhonchi, no rales, no hypoxia or respiratory distress, speaking full sentences ABD/GI: Normal bowel sounds; non-distended; soft, non-tender, no rebound, no guarding, no peritoneal signs, no hepatosplenomegaly BACK:  The back appears normal and is non-tender to palpation, there  is no CVA tenderness EXT: Normal ROM in all joints; non-tender to palpation; no edema; normal capillary refill; no cyanosis, no calf tenderness or swelling    SKIN: Normal color for age and race; warm; no rash NEURO: Moves all extremities equally PSYCH: Reports SI with plan.  Flat affect.  MEDICAL DECISION MAKING: Patient here with suicidal thoughts with plan.  Will obtain screening labs and urine.  No medical complaints at this time.  Will consult TTS.  ED PROGRESS: Patient's creatinine mildly elevated today at  1.3 and it appears it is been slowly rising over the past couple of visits.  Will give IV fluids and encourage oral fluids.  Could be related to her cocaine use.  Will avoid nephrotoxic medications such as NSAIDs.  I do not feel this requires medicine admission and can be followed up as an outpatient.  Otherwise labs unremarkable.  Drug screen is positive for marijuana and cocaine.  TTS recommends inpatient treatment and I agree.  She is here voluntarily.   I reviewed all nursing notes, vitals, pertinent previous records, EKGs, lab and urine results, imaging (as available).      Corrinna Karapetyan, Delice Bison, DO 02/08/19 0425

## 2019-02-08 NOTE — ED Notes (Signed)
NT was speaking to pt with patience and kindness trying to explain the process of getting back to her room in TCU. Pt continued to raise her voice cuss and call the NT names as I was cleaning room. NT continued to try and calm her down and have patience and explain the process but pt continued to get louder. I walked over to bed and asked pt to please lower her voice and to not speak to health care workers that way. I asked her what was she upset about and maybe I could help. I explained to pt the same thing that NT explained and she continued to point her fingers in my face and cuss. Security then showed up bc pt continued to get louder and louder and pt grew angry when security arrived. I explained to pt that security arrived bc she was drawing attention to herself by cursing and calling names. I asked her to lower her voice and she continued to get loud so everyone walked away and allowed security to handle the problem.

## 2019-02-08 NOTE — Progress Notes (Signed)
Pt arrive back on the unit. Pt appear to have tears in her eyes. Staff asked would you like a chair to sit down. Pt stood there as if I said nothing to her. RN was there to witness.

## 2019-02-08 NOTE — ED Notes (Signed)
Bed: [facility] Expected date:  Expected time:  Means of arrival:  Comments: 23 yo suicidal

## 2019-02-08 NOTE — ED Triage Notes (Signed)
Pt reports cocaine since 1300 02/07/2019. Pt presents with temporary splint to left hand d/t fx 3 weeks ago. Pt reports SI with plan to OD on gabapentin.

## 2019-02-08 NOTE — ED Notes (Signed)
[facility] transport requested. 

## 2019-02-08 NOTE — ED Notes (Signed)
Pending [facility], rm 306-1 between 2.30 - 3.00pm.

## 2019-02-08 NOTE — ED Notes (Signed)
Report called to RN Elberta Fortis, [facility] rm 306-1.  Pending [facility] transport.

## 2019-02-09 DIAGNOSIS — F3189 Other bipolar disorder: Secondary | ICD-10-CM

## 2019-02-09 DIAGNOSIS — F1210 Cannabis abuse, uncomplicated: Secondary | ICD-10-CM

## 2019-02-09 DIAGNOSIS — F332 Major depressive disorder, recurrent severe without psychotic features: Principal | ICD-10-CM

## 2019-02-09 DIAGNOSIS — F14188 Cocaine abuse with other cocaine-induced disorder: Secondary | ICD-10-CM

## 2019-02-09 DIAGNOSIS — F1994 Other psychoactive substance use, unspecified with psychoactive substance-induced mood disorder: Secondary | ICD-10-CM

## 2019-02-09 MED ORDER — TOPIRAMATE 100 MG PO TABS
100.0000 mg | ORAL_TABLET | Freq: Three times a day (TID) | ORAL | Status: DC
  Administered 2019-02-09 – 2019-02-10 (×4): 100 mg via ORAL
  Filled 2019-02-09 (×14): qty 1

## 2019-02-09 MED ORDER — RISPERIDONE 3 MG PO TABS
3.0000 mg | ORAL_TABLET | Freq: Two times a day (BID) | ORAL | Status: DC
  Filled 2019-02-09 (×5): qty 1

## 2019-02-09 MED ORDER — TRAZODONE HCL 100 MG PO TABS
200.0000 mg | ORAL_TABLET | Freq: Every day | ORAL | Status: DC
  Administered 2019-02-09 – 2019-02-10 (×2): 200 mg via ORAL
  Filled 2019-02-09 (×5): qty 2

## 2019-02-09 MED ORDER — BENZTROPINE MESYLATE 0.5 MG PO TABS
0.5000 mg | ORAL_TABLET | Freq: Two times a day (BID) | ORAL | Status: DC
  Administered 2019-02-09 – 2019-02-10 (×2): 0.5 mg via ORAL
  Filled 2019-02-09 (×5): qty 1

## 2019-02-09 MED ORDER — CITALOPRAM HYDROBROMIDE 20 MG PO TABS
20.0000 mg | ORAL_TABLET | Freq: Every day | ORAL | Status: DC
  Administered 2019-02-09 – 2019-02-13 (×5): 20 mg via ORAL
  Filled 2019-02-09 (×2): qty 1
  Filled 2019-02-09: qty 14
  Filled 2019-02-09 (×7): qty 1

## 2019-02-09 MED ORDER — ARIPIPRAZOLE ER 400 MG IM SRER
400.0000 mg | Freq: Once | INTRAMUSCULAR | Status: AC
  Administered 2019-02-09: 400 mg via INTRAMUSCULAR

## 2019-02-09 NOTE — Progress Notes (Signed)
Post 1:1 Note  D. Patient off of 1:1 observation- pt presents with an animated affect- anxious mood- friendly during interactions .  Pt currently denies SI/HI and AV hallucination A. Labs and vitals monitored. Pt compliant with medications. Pt supported emotionally and encouraged to express concerns and ask questions.   R. Pt remains safe with 15 minute checks. Will continue POC.

## 2019-02-09 NOTE — Progress Notes (Signed)
[place] attended wrap-up group tonight. Pt appears animated/anxious in affect and mood. Pt denies SI/HI/AVH/Pain at this time. Pt states she hopes to get into a 30 day program either at [facility] or [facility]. Pt states after completion, she hopes to move to [place] to start over. Trazodone was increase to 200 HS. Support offered. Will continue with POC.
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
## 2019-02-09 NOTE — Progress Notes (Signed)
Post 1:1 Note  Patient observed in the milieu interacting appropriately with peers. No behavior issues noted. Pt remains safe on the unit with Q 15 min checks

## 2019-02-09 NOTE — Plan of Care (Signed)
Patient at this point is more cooperative she was very sluggish earlier she is requesting specific medication changes to resume her long-acting injectable aripiprazole something for irritability and we discussed the role of topiramate.

## 2019-02-09 NOTE — Plan of Care (Signed)
D: Patient arrived back on the unit at 845 pm after being sent to [facility]. Patient still has ace wrap on left hand/wrist, ED would not remove it. Patient has tears in her eyes and on her cheeks. Patient is alert and cooperative. Patient mood is labile. Patient reports slamming doors and hitting walls in the ED and requests geodon. Endorses passive SI. Denies HI, AVH, and verbally contracts for safety.   1:1 started because of ace wrap, labile mood, and poor impulse control.     A: Patient refused scheduled medications stating "I'm not refusing them, my medications were changed". Patient did take geodon ordered by on call provider. Support provided. Patient educated on safety on the unit and medications. Routine safety checks every 15 minutes. Patient stated understanding to tell nurse about any new physical symptoms. Patient understands to tell staff of any needs.    Patient has been in bed sleeping comfortably since geodon administration.    R: No adverse drug reactions noted. Patient verbally contracts for safety. Patient remains safe at this time and will continue to monitor.   Problem: Safety: Goal: Periods of time without injury will increase Outcome: Progressing   Patient remains safe and will continue to monitor.

## 2019-02-09 NOTE — Progress Notes (Signed)
[facility] Group Notes:  (Nursing/MHT/Case Management/Adjunct)  Date:  02/09/2019  Time:  2045  Type of Therapy:  wrap up group  Participation Level:  Active  Participation Quality:  Appropriate, Attentive, Sharing and Supportive  Affect:  Appropriate  Cognitive:  Lacking  Insight:  Lacking  Engagement in Group:  Engaged  Modes of Intervention:  Clarification, Education and Support  Summary of Progress/Problems: Pt reports wanting to go to a 21 or 28 day program and then move to [place]. When asked why [place] and if she has support there, pt responds because it is pretty and close to [place] where I have family. Pt reports no support in . Pt shared that she has an advocate named Langley Gauss that will help her get her belonings from the shelter before potential rehab. Pt reports definite relapse if she has to go back to shelter. Pt is grateful she is alive.   Shellia Cleverly 02/09/2019, 10:27 PM

## 2019-02-09 NOTE — Progress Notes (Signed)
1:1 note Patient is observed sleeping in her bed. Sitter within arms reach. Respirations even and unlabored. No distress noted.

## 2019-02-09 NOTE — Progress Notes (Signed)
Post 1:1 Note  Patient participating in Rec therapy outside. No behavior issues noted or complaints voiced. Pt remains safe on the unit with Q 15 min checks

## 2019-02-09 NOTE — Progress Notes (Addendum)
1:1 note Patient is observed in hallway with tears in her eyes. She asks for geodon for agitation. MHT is within arms reach. Patient is restless. Left hand and forearm are wrapped with ace bandage.   Patient reports slamming door and hitting walls in the ED because she was angry.   This RN will contact on call provider for agitation medication.  1:1 stared for safety due to ace bandage, poor impulse control, labile mood.
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
## 2019-02-09 NOTE — Progress Notes (Signed)
Adult Psychoeducational Group Note  Date:  02/09/2019 Time:  6:51 PM  Group Topic/Focus:  Conflict Resolution:   The focus of this group is to discuss the conflict resolution process and how it may be used upon discharge.  Participation Level:  Active  Participation Quality:  Appropriate  Affect:  Appropriate  Cognitive:  Alert  Insight: Appropriate  Engagement in Group:  Engaged  Modes of Intervention:  Discussion  Additional Comments:  Pt attended group and participated in discussion.  Margi Edmundson R Ocie Stanzione 02/09/2019, 6:51 PM
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
## 2019-02-09 NOTE — BHH Group Notes (Signed)
LCSW Group Therapy Note  02/09/2019 3:56 PM  Type of Therapy and Topic: Group Therapy: Avoiding Self-Sabotaging and Enabling Behaviors  Participation Level: Active  Description of Group:  In this group, patients will learn how to identify obstacles, self-sabotaging and enabling behaviors, as well as: what are they, why do we do them and what needs these behaviors meet. Discuss unhealthy relationships and how to have positive healthy boundaries with those that sabotage and enable. Explore aspects of self-sabotage and enabling in yourself and how to limit these self-destructive behaviors in everyday life.  Therapeutic Goals: 1. Patient will identify one obstacle that relates to self-sabotage and enabling behaviors 2. Patient will identify one personal self-sabotaging or enabling behavior they did prior to admission 3. Patient will state a plan to change the above identified behavior 4. Patient will demonstrate ability to communicate their needs through discussion and/or role play.   Summary of Patient Progress: Patient actively and appropriately participated in the group discussion, at times monopolizing the conversation, but responded well to redirection. Patient shared that she struggles with BPD and relayed an analogy about having a tiger inside of her due to trauma and consequently, she cannot control her emotions. Patient shared that living with mental illness gives her a higher spiritual connection to God and helps her better understand various family members and her ex-boyfriend who are all reportedly diagnosed with schizophrenia.   Therapeutic Modalities:  Cognitive Behavioral Therapy Person-Centered Therapy Motivational Interviewing  Stephanie Acre, MSW, [place] Social Worker

## 2019-02-09 NOTE — Progress Notes (Signed)
Post 1:1 Note  Patient observed in the dayroom interacting well with peers and staff. No behavior issues noted. Pt remains safe on the unit with Q 15 min checks

## 2019-02-10 DIAGNOSIS — F603 Borderline personality disorder: Secondary | ICD-10-CM

## 2019-02-10 LAB — PREGNANCY, URINE: PREG TEST UR: NEGATIVE

## 2019-02-10 NOTE — Tx Team (Signed)
Interdisciplinary Treatment and Diagnostic Plan Update  02/10/2019 Time of Session: 10:00am Natasha Long MRN: [id number]  Principal Diagnosis: <principal problem not specified>  Secondary Diagnoses: Active Problems:   MDD (major depressive disorder), recurrent severe, without psychosis ([place])   Current Medications:  Current Facility-Administered Medications  Medication Dose Route Frequency Provider Last Rate Last Dose  . acetaminophen (TYLENOL) tablet 650 mg  650 mg Oral Q6H PRN Ethelene Hal, NP      . alum & mag hydroxide-simeth (MAALOX/MYLANTA) 200-200-20 MG/5ML suspension 30 mL  30 mL Oral Q4H PRN Ethelene Hal, NP      . citalopram (CELEXA) tablet 20 mg  20 mg Oral Daily Johnn Hai, MD   20 mg at 02/10/19 0813  . gabapentin (NEURONTIN) capsule 800 mg  800 mg Oral TID Ethelene Hal, NP   800 mg at 02/10/19 1212  . haloperidol (HALDOL) tablet 5 mg  5 mg Oral Once Laverle Hobby, PA-C      . hydrOXYzine (ATARAX/VISTARIL) tablet 25 mg  25 mg Oral TID PRN Ethelene Hal, NP      . magnesium hydroxide (MILK OF MAGNESIA) suspension 30 mL  30 mL Oral Daily PRN Ethelene Hal, NP      . nicotine (NICODERM CQ - dosed in mg/24 hours) patch 21 mg  21 mg Transdermal Daily Sharma Covert, MD   21 mg at 02/10/19 0813  . propranolol (INDERAL) tablet 10 mg  10 mg Oral BID Ethelene Hal, NP   10 mg at 02/10/19 0813  . topiramate (TOPAMAX) tablet 100 mg  100 mg Oral TID Johnn Hai, MD   100 mg at 02/10/19 1211  . traZODone (DESYREL) tablet 200 mg  200 mg Oral QHS Johnn Hai, MD   200 mg at 02/09/19 2108  . traZODone (DESYREL) tablet 50 mg  50 mg Oral QHS PRN Ethelene Hal, NP       PTA Medications: Medications Prior to Admission  Medication Sig Dispense Refill Last Dose  . ARIPiprazole (ABILIFY) 10 MG tablet Take 1 tablet (10 mg total) by mouth at bedtime. (Patient not taking: Reported on 02/08/2019) 30 tablet 1 Not Taking at  Unknown time  . gabapentin (NEURONTIN) 400 MG capsule Take 2 capsules (800 mg total) by mouth 3 (three) times daily. 180 capsule 1 Past Week at Unknown time  . propranolol (INDERAL) 10 MG tablet Take 10 mg by mouth 2 (two) times daily.   Past Week at Unknown time  . traZODone (DESYREL) 100 MG tablet Take 100 mg by mouth at bedtime as needed for sleep.    Past Month at Unknown time    Patient Stressors: Financial difficulties Loss of relationship with boyfriend Substance abuse  Patient Strengths: Ability for insight Average or above average intelligence Capable of independent living [employer] of knowledge Motivation for treatment/growth  Treatment Modalities: Medication Management, Group therapy, Case management,  1 to 1 session with clinician, Psychoeducation, Recreational therapy.   Physician Treatment Plan for Primary Diagnosis: <principal problem not specified> Long Term Goal(s): Improvement in symptoms so as ready for discharge Improvement in symptoms so as ready for discharge   Short Term Goals: Ability to maintain clinical measurements within normal limits will improve Compliance with prescribed medications will improve Compliance with prescribed medications will improve  Medication Management: Evaluate patient's response, side effects, and tolerance of medication regimen.  Therapeutic Interventions: 1 to 1 sessions, Unit Group sessions and Medication administration.  Evaluation of Outcomes: Not Met  Physician Treatment Plan for Secondary Diagnosis: Active Problems:   MDD (major depressive disorder), recurrent severe, without psychosis ([place])  Long Term Goal(s): Improvement in symptoms so as ready for discharge Improvement in symptoms so as ready for discharge   Short Term Goals: Ability to maintain clinical measurements within normal limits will improve Compliance with prescribed medications will improve Compliance with prescribed medications will improve      Medication Management: Evaluate patient's response, side effects, and tolerance of medication regimen.  Therapeutic Interventions: 1 to 1 sessions, Unit Group sessions and Medication administration.  Evaluation of Outcomes: Not Met   RN Treatment Plan for Primary Diagnosis: <principal problem not specified> Long Term Goal(s): Knowledge of disease and therapeutic regimen to maintain health will improve  Short Term Goals: Ability to remain free from injury will improve, Ability to verbalize frustration and anger appropriately will improve, Ability to demonstrate self-control, Ability to identify and develop effective coping behaviors will improve and Compliance with prescribed medications will improve  Medication Management: RN will administer medications as ordered by provider, will assess and evaluate patient's response and provide education to patient for prescribed medication. RN will report any adverse and/or side effects to prescribing provider.  Therapeutic Interventions: 1 on 1 counseling sessions, Psychoeducation, Medication administration, Evaluate responses to treatment, Monitor vital signs and CBGs as ordered, Perform/monitor CIWA, COWS, AIMS and Fall Risk screenings as ordered, Perform wound care treatments as ordered.  Evaluation of Outcomes: Not Met   LCSW Treatment Plan for Primary Diagnosis: <principal problem not specified> Long Term Goal(s): Safe transition to appropriate next level of care at discharge, Engage patient in therapeutic group addressing interpersonal concerns.  Short Term Goals: Engage patient in aftercare planning with referrals and resources, Increase social support, Increase emotional regulation, Identify triggers associated with mental health/substance abuse issues and Increase skills for wellness and recovery  Therapeutic Interventions: Assess for all discharge needs, 1 to 1 time with Social worker, Explore available resources and support systems,  Assess for adequacy in community support network, Educate family and significant other(s) on suicide prevention, Complete Psychosocial Assessment, Interpersonal group therapy.  Evaluation of Outcomes: Not Met   Progress in Treatment: Attending groups: Yes. Participating in groups: Yes. Taking medication as prescribed: Yes. Toleration medication: Yes. Family/Significant other contact made: No, will contact:  declines consents Patient understands diagnosis: Yes. Discussing patient identified problems/goals with staff: Yes. Medical problems stabilized or resolved: Yes. Denies suicidal/homicidal ideation: No. Issues/concerns per patient self-inventory: Yes.  New problem(s) identified: Yes, Describe:  homeless, limited social supports  New Short Term/Long Term Goal(s): detox, medication management for mood stabilization; elimination of SI thoughts; development of comprehensive mental wellness/sobriety plan.  Patient Goals:  Wants residential treatment, or a bus pass to [place] or [place]  Discharge Plan or Barriers: [facility] screening on 03/16. She can return to [employer] [facility] and is followed by [facility] as a back up.  Reason for Continuation of Hospitalization: Anxiety Depression Mania Suicidal ideation  Estimated Length of Stay: discharge 02/13/2019  Attendees: Patient: Natasha Long 02/10/2019 2:22 PM  Physician:  02/10/2019 2:22 PM  Nursing:  02/10/2019 2:22 PM  RN Care Manager: 02/10/2019 2:22 PM  Social Worker: Stephanie Acre, [place] 02/10/2019 2:22 PM  Recreational Therapist:  02/10/2019 2:22 PM  Other:  02/10/2019 2:22 PM  Other:  02/10/2019 2:22 PM  Other: 02/10/2019 2:22 PM    Scribe for Treatment Team: Joellen Jersey, [place] 02/10/2019 2:22 PM

## 2019-02-10 NOTE — Plan of Care (Signed)

## 2019-02-10 NOTE — Progress Notes (Signed)
Recreation Therapy Notes  Date:  3.13.20 Time: 0930 Location: [address] Dayroom  Group Topic: Stress Management  Goal Area(s) Addresses:  Patient will identify positive stress management techniques. Patient will identify benefits of using stress management post d/c.  Behavioral Response:  Engaged  Intervention:  Stress Management  Activity :  Meditation.  LRT introduced the stress management technique of meditation.  LRT played a meditation that focused on approaching the day with endless possibilities.  Education:  Stress Management, Discharge Planning.   Education Outcome: Acknowledges Education  Clinical Observations/Feedback:  Pt attended and participated in group.     Victorino Sparrow, LRT/CTRS         Victorino Sparrow A 02/10/2019 10:46 AM

## 2019-02-10 NOTE — Progress Notes (Signed)
Patient has a [facility] screening on Monday, 02/13/2019. Psychiatry made aware, plan for discharge Monday at 7:45a.m.  Patient will need discharge orders in on Sunday evening.  She will need a taxi voucher on her chart to leave at 7:00am to arrive in time for her 7:45am intake.  She will need a 28 day supply of medication.  Stephanie Acre, LCSW-A Clinical Social Worker

## 2019-02-10 NOTE — Progress Notes (Signed)
Patient has spoken with CSW multiple times this morning. During the most recent interaction, she tearfully intruded on CSW completing a PSA with another patient on the unit.   Patient anxious about being accepted into a residential treatment program. Referrals were faxed to [facility] and [facility] on 02/09/2019. The referrals have not been reviewed at this time, this was explained to the patient. Patient reports she will relapse if she is discharged without going into a residential treatment facility. CSW explained again that patients who are stabilized and psychiatrically cleared for discharge are not boarded at [facility] for residential treatment. CSW will follow up with referrals this afternoon.  Patient continues to ask about transitional housing and for shelter listings in [place] states. Her "fiance," was a recent patient on the [address] and discharged to live with family in [place] state. She reports she wants to make her way up [place] to be able to live closer to him in an attempt to get back together. She is not welcome to live with the gentleman or his family.   Patient currently asking for referrals for housing in [place]. When asked if she has been to [place] or knows anyone there, patient tearfully asks "oh, so y'all don't help people who don't have family?" CSW explained that transportation of this sort is not arranged but CSW could print a list of shelters in [place] if she still wishes to pursue this.  Stephanie Acre, LCSW-A Clinical Social Worker

## 2019-02-10 NOTE — Progress Notes (Signed)
Natasha Long attended wrap-up group tonight. Pt appears animated/anxious in affect and mood. Pt denies SI/HI/AVH/Pain at this time. Pt states she hopes to talk to a Education officer, museum. No new c/o's.Support offered. Will continue with POC.

## 2019-02-10 NOTE — Progress Notes (Signed)
Patient self inventory- Patient slept fair last night, sleep medication was requested and it was helpful. Appetite is good, energy level low, concentration good. Depression, hopelessness, and anxiety rated 7, 7, 8 out of 10. Endorses cravings, agitation, sedation, nausea, and irritability. Denies SI HI AVH. Endorses physical pain in her left hand rated 5/10. Patient's goal is to work on "my anger by practicing coping mechanisms."  Patient is compliant with medications prescribed. No side effects noted. Safety is maintained with 15 minute checks as well as environmental checks. Will continue to monitor.

## 2019-02-10 NOTE — Progress Notes (Signed)
Natasha Long [facility] MD Progress Note  02/10/2019 9:10 AM Natasha Long  MRN:  [id number] Subjective:    Patient states if she leaves today she will relapse on cocaine she is seeking some type of long-term rehab however states she may go back to [facility] so she tells different examiners different plans post discharge.  She states she is on "a lot of pills" but will give them a try she denies cravings today denies wanting to harm self or others. Contracts for safety only here. Denies auditory and visual hallucinations.  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 Bipolar affective, manic ([place])   . Hypertension    diagnosed as child; stopped meds at 89 yo  . Hypoglycemia   . Insomnia   . Nephrotic syndrome   . Nephrotic syndrome   . PTSD (post-traumatic stress disorder)   . Renal disease     Past Surgical History:  Procedure Laterality Date  . CHOLECYSTECTOMY    . extraction of wisdom teeth    . RENAL BIOPSY     Family History:  Family History  Problem Relation Age of Onset  . Diabetes Other   . Hypertension Other     Social History:  Social History   Substance and Sexual Activity  Alcohol Use No     Social History   Substance and Sexual Activity  Drug Use Yes  . Types: Marijuana, Cocaine    Social History   Socioeconomic History  . Marital status: Single    Spouse name: Not on file  . Number of children: 1  . Years of education: Not on file  . Highest education level: 12th grade  Occupational History  . Not on file  Social Needs  . Financial resource strain: Patient refused  . Food insecurity:    Worry: Patient refused    Inability: Patient refused  . Transportation needs:    Medical: Patient refused   Non-medical: Patient refused  Tobacco Use  . Smoking status: Current Every Day Smoker    Packs/day: 0.50    Types: Cigarettes  . Smokeless tobacco: Never Used  Substance and Sexual Activity  . Alcohol use: No  . Drug use: Yes    Types: Marijuana, Cocaine  . Sexual activity: Yes    Birth control/protection: Condom  Lifestyle  . Physical activity:    Days per week: Patient refused    Minutes per session: Patient refused  . Stress: Patient refused  Relationships  . Social connections:    Talks on phone: Patient refused    Gets together: Patient refused    Attends religious service: Patient refused    Active member of club or organization: Patient refused    Attends meetings of clubs or organizations: Patient refused    Relationship status: Patient refused  Other Topics Concern  . Not on file  Social History Narrative  . Not on file   Additional Social History:                         Sleep: Fair  Appetite:  Fair  Current Medications: Current Facility-Administered Medications  Medication Dose Route Frequency Provider Last Rate Last Dose  . acetaminophen (TYLENOL) tablet 650 mg  650 mg Oral Q6H PRN Ethelene Hal, NP      .  alum & mag hydroxide-simeth (MAALOX/MYLANTA) 200-200-20 MG/5ML suspension 30 mL  30 mL Oral Q4H PRN Ethelene Hal, NP      . citalopram (CELEXA) tablet 20 mg  20 mg Oral Daily Johnn Hai, MD   20 mg at 02/10/19 0813  . gabapentin (NEURONTIN) capsule 800 mg  800 mg Oral TID Ethelene Hal, NP   800 mg at 02/10/19 0813  . haloperidol (HALDOL) tablet 5 mg  5 mg Oral Once Laverle Hobby, PA-C      . hydrOXYzine (ATARAX/VISTARIL) tablet 25 mg  25 mg Oral TID PRN Ethelene Hal, NP      . magnesium hydroxide (MILK OF MAGNESIA) suspension 30 mL  30 mL Oral Daily PRN Ethelene Hal, NP      . nicotine (NICODERM CQ - dosed in mg/24 hours) patch 21 mg  21 mg Transdermal Daily Sharma Covert, MD   21 mg at  02/10/19 0813  . propranolol (INDERAL) tablet 10 mg  10 mg Oral BID Ethelene Hal, NP   10 mg at 02/10/19 0813  . topiramate (TOPAMAX) tablet 100 mg  100 mg Oral TID Johnn Hai, MD   100 mg at 02/10/19 0814  . traZODone (DESYREL) tablet 200 mg  200 mg Oral QHS Johnn Hai, MD   200 mg at 02/09/19 2108  . traZODone (DESYREL) tablet 50 mg  50 mg Oral QHS PRN Ethelene Hal, NP        Lab Results: No results found for this or any previous visit (from the past 12 hour(s)).  Blood Alcohol level:  Lab Results  Component Value Date   ETH <10 02/08/2019   ETH <10 62/56/3893    Metabolic Disorder Labs: No results found for: HGBA1C, MPG No results found for: PROLACTIN Lab Results  Component Value Date   CHOL 169 09/15/2017   TRIG 196 (H) 09/15/2017   HDL 53 09/15/2017   CHOLHDL 3.2 09/15/2017   LDLCALC 77 09/15/2017    Physical Findings: AIMS: Facial and Oral Movements Muscles of Facial Expression: None, normal Lips and Perioral Area: None, normal Jaw: None, normal Tongue: None, normal,Extremity Movements Upper (arms, wrists, hands, fingers): None, normal Lower (legs, knees, ankles, toes): None, normal, Trunk Movements Neck, shoulders, hips: None, normal, Overall Severity Severity of abnormal movements (highest score from questions above): None, normal Incapacitation due to abnormal movements: None, normal Patient's awareness of abnormal movements (rate only patient's report): No Awareness, Dental Status Current problems with teeth and/or dentures?: No Does patient usually wear dentures?: No  CIWA:    COWS:     Musculoskeletal: Strength & Muscle Tone: within normal limits Gait & Station: normal Patient leans: N/A  Psychiatric Specialty Exam: Physical Exam  ROS  Blood pressure 116/67, pulse 75, temperature 98.4 F (36.9 C), temperature source Oral, resp. rate 20, height 5\' 8"  (1.727 m), weight (!) 137.9 kg, unknown if currently breastfeeding.Body mass  index is 46.22 kg/m.  General Appearance: Casual  Eye Contact:  Good  Speech:  Clear and Coherent  Volume:  Normal  Mood:  Dysphoric  Affect:  Appropriate  Thought Process:  Linear  Orientation:  Full (Time, Place, and Person)  Thought Content:  Tangential  Suicidal Thoughts:  No  Homicidal Thoughts:  No  Memory:  Immediate;   Fair  Judgement:  Fair  Insight:  Fair  Psychomotor Activity:  Normal  Concentration:  Concentration: Good  Recall:  [place] of Knowledge:  Fair  Language:  Fair  Akathisia:  Negative  Handed:  Right  AIMS (if indicated):     Assets:  Physical Health Resilience  ADL's:  Intact  Cognition:  WNL  Sleep:  Number of Hours: 6.75     Treatment Plan Summary: Daily contact with patient to assess and evaluate symptoms and progress in treatment, Medication management and Plan We will monitor through the weekend continue current meds without change continue current cognitive and rehab based therapies current precautions to continue  [facility], MD 02/10/2019, 9:10 AM

## 2019-02-11 DIAGNOSIS — F17210 Nicotine dependence, cigarettes, uncomplicated: Secondary | ICD-10-CM

## 2019-02-11 MED ORDER — MENTHOL 3 MG MT LOZG
1.0000 | LOZENGE | OROMUCOSAL | Status: DC | PRN
  Administered 2019-02-11: 3 mg via ORAL

## 2019-02-11 MED ORDER — GUAIFENESIN ER 600 MG PO TB12
1200.0000 mg | ORAL_TABLET | Freq: Two times a day (BID) | ORAL | Status: DC | PRN
  Administered 2019-02-11: 1200 mg via ORAL
  Filled 2019-02-11: qty 2

## 2019-02-11 MED ORDER — TRAZODONE HCL 100 MG PO TABS
100.0000 mg | ORAL_TABLET | Freq: Every evening | ORAL | Status: DC | PRN
  Administered 2019-02-11 – 2019-02-13 (×3): 100 mg via ORAL
  Filled 2019-02-11: qty 14
  Filled 2019-02-11 (×3): qty 1

## 2019-02-11 NOTE — Progress Notes (Signed)
[facility] MD Progress Note  02/11/2019 1:54 PM Natasha Long  MRN:  [id number] Subjective: Patient reports some improvement compared to admission.  Expressed concern and worry about potentially being pregnant, and expressed significant sense of relief when we reviewed negative pregnancy test result.  Currently denies suicidal ideations and presents future oriented, stating that she wants to go to a rehab after discharge and from there wants to relocate to [place]. Currently does not endorse medication side effects.  Objective: I have reviewed chart notes and have met with patient.  23 year old female, history of mood disorder, has been diagnosed with bipolar disorder in the past, history of PTSD diagnoses, history of substance abuse.  Presented reporting depression, suicidal thoughts of overdosing.  Admission UDS positive for cocaine and cannabis.  Patient describes some improvement compared to admission.  As above, currently presents future oriented and focusing more on disposition planning, reporting a thought of relocating to [place] following completion of the rehab, to which she wants to go after discharge from this unit.  Currently does not endorse medication side effects.  No disruptive or agitated behaviors on unit.  Going to some groups, pleasant on approach.  Reports she thought she might be pregnant-3/11 hCG negative, 3/13 urine pregnancy test negative.    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 Bipolar affective, manic (Jack)   . Hypertension    diagnosed as child; stopped meds at 78 yo  . Hypoglycemia   . Insomnia   . Nephrotic syndrome   . Nephrotic syndrome   . PTSD (post-traumatic stress disorder)   . Renal  disease     Past Surgical History:  Procedure Laterality Date  . CHOLECYSTECTOMY    . extraction of wisdom teeth    . RENAL BIOPSY     Family History:  Family History  Problem Relation Age of Onset  . Diabetes Other   . Hypertension Other     Social History:  Social History   Substance and Sexual Activity  Alcohol Use No     Social History   Substance and Sexual Activity  Drug Use Yes  . Types: Marijuana, Cocaine    Social History   Socioeconomic History  . Marital status: Single    Spouse name: Not on file  . Number of children: 1  . Years of education: Not on file  . Highest education level: 12th grade  Occupational History  . Not on file  Social Needs  . Financial resource strain: Patient refused  . Food insecurity:    Worry: Patient refused    Inability: Patient refused  . Transportation needs:    Medical: Patient refused    Non-medical: Patient refused  Tobacco Use  . Smoking status: Current Every Day Smoker    Packs/day: 0.50    Types: Cigarettes  . Smokeless tobacco: Never Used  Substance and Sexual Activity  . Alcohol use: No  . Drug use: Yes    Types: Marijuana, Cocaine  . Sexual activity: Yes    Birth control/protection: Condom  Lifestyle  . Physical activity:    Days per week: Patient refused    Minutes per session: Patient refused  . Stress: Patient refused  Relationships  . Social connections:    Talks on phone: Patient refused    Gets  together: Patient refused    Attends religious service: Patient refused    Active member of club or organization: Patient refused    Attends meetings of clubs or organizations: Patient refused    Relationship status: Patient refused  Other Topics Concern  . Not on file  Social History Narrative  . Not on file   Additional Social History:   Sleep: Improving  Appetite:  Improving  Current Medications: Current Facility-Administered Medications  Medication Dose Route Frequency Provider Last Rate  Last Dose  . acetaminophen (TYLENOL) tablet 650 mg  650 mg Oral Q6H PRN Ethelene Hal, NP      . alum & mag hydroxide-simeth (MAALOX/MYLANTA) 200-200-20 MG/5ML suspension 30 mL  30 mL Oral Q4H PRN Ethelene Hal, NP      . citalopram (CELEXA) tablet 20 mg  20 mg Oral Daily Johnn Hai, MD   20 mg at 02/11/19 1057  . gabapentin (NEURONTIN) capsule 800 mg  800 mg Oral TID Ethelene Hal, NP   800 mg at 02/11/19 1151  . haloperidol (HALDOL) tablet 5 mg  5 mg Oral Once Laverle Hobby, PA-C      . hydrOXYzine (ATARAX/VISTARIL) tablet 25 mg  25 mg Oral TID PRN Ethelene Hal, NP      . magnesium hydroxide (MILK OF MAGNESIA) suspension 30 mL  30 mL Oral Daily PRN Ethelene Hal, NP      . nicotine (NICODERM CQ - dosed in mg/24 hours) patch 21 mg  21 mg Transdermal Daily Sharma Covert, MD   21 mg at 02/10/19 0813  . propranolol (INDERAL) tablet 10 mg  10 mg Oral BID Ethelene Hal, NP   10 mg at 02/11/19 1057  . topiramate (TOPAMAX) tablet 100 mg  100 mg Oral TID Johnn Hai, MD   100 mg at 02/10/19 1725  . traZODone (DESYREL) tablet 200 mg  200 mg Oral QHS Johnn Hai, MD   200 mg at 02/10/19 2120  . traZODone (DESYREL) tablet 50 mg  50 mg Oral QHS PRN Ethelene Hal, NP        Lab Results:  Results for orders placed or performed during the hospital encounter of 02/08/19 (from the past 48 hour(s))  Pregnancy, urine     Status: None   Collection Time: 02/10/19 12:28 PM  Result Value Ref Range   Preg Test, Ur NEGATIVE NEGATIVE    Comment:        THE SENSITIVITY OF THIS METHODOLOGY IS >20 mIU/mL. Performed at [facility], [place] [address] [place], [place] [address]     Blood Alcohol level:  Lab Results  Component Value Date   ETH <10 02/08/2019   ETH <10 34/28/7681    Metabolic Disorder Labs: No results found for: HGBA1C, MPG No results found for: PROLACTIN Lab Results  Component Value Date   CHOL 169  09/15/2017   TRIG 196 (H) 09/15/2017   HDL 53 09/15/2017   CHOLHDL 3.2 09/15/2017   LDLCALC 77 09/15/2017    Physical Findings: AIMS: Facial and Oral Movements Muscles of Facial Expression: None, normal Lips and Perioral Area: None, normal Jaw: None, normal Tongue: None, normal,Extremity Movements Upper (arms, wrists, hands, fingers): None, normal Lower (legs, knees, ankles, toes): None, normal, Trunk Movements Neck, shoulders, hips: None, normal, Overall Severity Severity of abnormal movements (highest score from questions above): None, normal Incapacitation due to abnormal movements: None, normal Patient's awareness of abnormal movements (rate only patient's report): No Awareness,  Dental Status Current problems with teeth and/or dentures?: No Does patient usually wear dentures?: No  CIWA:    COWS:     Musculoskeletal: Strength & Muscle Tone: within normal limits-no psychomotor agitation or restlessness noted Gait & Station: normal Patient leans: N/A  Psychiatric Specialty Exam: Physical Exam  ROS no chest pain, no shortness of breath, no vomiting, no fever, no chills  Blood pressure 114/78, pulse 75, temperature 98.4 F (36.9 C), temperature source Oral, resp. rate 18, height '5\' 8"'  (1.727 m), weight (!) 137.9 kg, unknown if currently breastfeeding.Body mass index is 46.22 kg/m.  General Appearance: Casual  Eye Contact:  Good  Speech:  Normal Rate  Volume:  Normal  Mood:  Partially improved mood  Affect:  Somewhat blunted initially, tends to improve during session, smiles at times appropriately  Thought Process:  Linear and Descriptions of Associations: Intact  Orientation:  Other:  Fully alert and attentive  Thought Content:  Currently denies hallucinations and does not appear internally preoccupied, no delusions are expressed  Suicidal Thoughts:  No denies current suicidal ideations and as above presents future oriented at the present time  Homicidal Thoughts:  No   Memory:  Recent and remote grossly intact  Judgement:  Fair  Insight:  Fair  Psychomotor Activity:  Normal-no restlessness or agitation  Concentration:  Concentration: Fair and Attention Span: Fair  Recall:  [employer] of Knowledge:  Fair  Language:  Fair  Akathisia:  Negative  Handed:  Right  AIMS (if indicated):     Assets:  Physical Health Resilience  ADL's:  Intact  Cognition:  WNL  Sleep:  Number of Hours: 6.75    Assessment_  23 year old female, history of mood disorder, has been diagnosed with bipolar disorder in the past, history of PTSD diagnoses, history of substance abuse.  Presented reporting depression, suicidal thoughts of overdosing.  Admission UDS positive for cocaine and cannabis.   Patient is currently describing some improvement and today presents calm, pleasant on approach,  with a reactive affect, future oriented with thoughts of [place]ocating out of state upon completion of the rehab.  Does not endorse any specific medication side effects at this time but states she does not like how she feels on Topamax ,which was recently started (patient states for weight loss).  States she was not taking Topamax before admission.   Treatment Plan Summary: Encourage group and milieu participation to work on coping skills and symptom reduction Encourage efforts to work on sobriety and relapse prevention Treatment team working on disposition planning options-as above, patient expressing interest in going to a rehab at discharge Discontinue Topamax Continue Celexa 20 mg daily for depression/I anxiety Continue Neurontin 800 mg 3 times daily for anxiety, pain Patient received her monthly dose of Abilify Maintena 400 mg IM on 3/12  Decrease Trazodone to 100 mg nightly PRN for insomnia Check Lipid Panel, HgbA1C    Jenne Campus, MD 02/11/2019, 1:54 PM   Patient ID: Natasha Long, female   DOB: October 29, 1996, 23 y.o.   MRN: [id number]

## 2019-02-11 NOTE — Progress Notes (Addendum)
D Patient presents OOB UAL on the 300 hall today. SHe is slow to warm up to people. Initially, appearing agitated and unhappy, as the day has progressed , she has let her guard down more . Acting less paranoid, agitated and rigid. She presents wearing her own clothes ,  has a splinted forearm/ hand that is splinted / acewrapped and for which she says " yes..I'll go on and tell you. Aren't you getting mad at me???"       A Pt completed her daily assessment and on this she wrote she denied SI today and she rated her depression, hopelessness and anxiety " 7/7/7/", respectively. She is actively trying to examine and talk about her unhealthy behaviors and learn new healthier coping skills. Encouragement offered. 1700 Pt requested to see MD saying " I need something for my cough.Marland Kitchenibuprofen don't feel good". Temp 0rally 97.9. RN gave pt mask to wear and pt became agitated, yelling " youre singling me out..." RN went immediately to [facility] and requested she speak with pt ASAP ( x2). RN spoke with pt explaining procedure, explaining need to stay in her room..the patient cont to get more and more agitated, took mask off and was obserfved putting mask n the tashcan. RN spoke with Dr Parke Poisson, who spoke with ED MD who gives this guideline:  Check for recent exposure in [place] /  [place] / [place]. If not - let symptom go. If pt has been there, mask and isolate. Dr Parke Poisson gives order t check pt for flu. Encourage pt to wear mask and to stay isolated to her room until flu ruled out.      R Safety maintained.

## 2019-02-11 NOTE — BHH Group Notes (Signed)
Adult Psychoeducational Group Note  Date:  02/11/2019 Time:  9:00 AM  Group Topic/Focus: Goals Group Healthy Communication:   The focus of this group is to discuss communication, barriers to communication, as well as healthy ways to communicate with others.  Participation Level:  Active  Participation Quality:  Appropriate  Affect:  Appropriate  Cognitive:  Alert and Oriented  Insight: Improving  Engagement in Group:  Developing/Improving  Modes of Intervention:  Discussion, Socialization and Support  Additional Comments:  Patient reports her goal for the day is to "be a better person".  Deniel Mcquiston A Jahne Krukowski 02/11/2019, 9:30 AM 

## 2019-02-11 NOTE — Plan of Care (Signed)
  Problem: Education: Goal: Knowledge of [facility] Education information/materials will improve Outcome: Progressing Goal: Emotional status will improve Outcome: Progressing Goal: Mental status will improve Outcome: Progressing Goal: Verbalization of understanding the information provided will improve Outcome: Progressing   Problem: Activity: Goal: Interest or engagement in activities will improve Outcome: Progressing Goal: Sleeping patterns will improve Outcome: Progressing   Problem: Coping: Goal: Ability to verbalize frustrations and anger appropriately will improve Outcome: Progressing Goal: Ability to demonstrate self-control will improve Outcome: Progressing   Problem: Health Behavior/Discharge Planning: Goal: Identification of resources available to assist in meeting health care needs will improve Outcome: Progressing Goal: Compliance with treatment plan for underlying cause of condition will improve Outcome: Progressing   Problem: Physical Regulation: Goal: Ability to maintain clinical measurements within normal limits will improve Outcome: Progressing   Problem: Safety: Goal: Periods of time without injury will increase Outcome: Progressing   Problem: Education: Goal: Ability to make informed decisions regarding treatment will improve Outcome: Progressing   Problem: Coping: Goal: Coping ability will improve Outcome: Progressing   Problem: Health Behavior/Discharge Planning: Goal: Identification of resources available to assist in meeting health care needs will improve Outcome: Progressing   Problem: Medication: Goal: Compliance with prescribed medication regimen will improve Outcome: Progressing   Problem: Self-Concept: Goal: Ability to disclose and discuss suicidal ideas will improve Outcome: Progressing Goal: Will verbalize positive feelings about self Outcome: Progressing   Problem: Education: Goal: Utilization of techniques to improve  thought processes will improve Outcome: Progressing Goal: Knowledge of the prescribed therapeutic regimen will improve Outcome: Progressing   Problem: Activity: Goal: Interest or engagement in leisure activities will improve Outcome: Progressing Goal: Imbalance in normal sleep/wake cycle will improve Outcome: Progressing   Problem: Coping: Goal: Coping ability will improve Outcome: Progressing Goal: Will verbalize feelings Outcome: Progressing   Problem: Health Behavior/Discharge Planning: Goal: Ability to make decisions will improve Outcome: Progressing Goal: Compliance with therapeutic regimen will improve Outcome: Progressing   Problem: Role Relationship: Goal: Will demonstrate positive changes in social behaviors and relationships Outcome: Progressing   Problem: Safety: Goal: Ability to disclose and discuss suicidal ideas will improve Outcome: Progressing Goal: Ability to identify and utilize support systems that promote safety will improve Outcome: Progressing   Problem: Self-Concept: Goal: Will verbalize positive feelings about self Outcome: Progressing Goal: Level of anxiety will decrease Outcome: Progressing   Problem: Education: Goal: Ability to state activities that reduce stress will improve Outcome: Progressing   Problem: Coping: Goal: Ability to identify and develop effective coping behavior will improve Outcome: Progressing   Problem: Self-Concept: Goal: Ability to identify factors that promote anxiety will improve Outcome: Progressing Goal: Level of anxiety will decrease Outcome: Progressing Goal: Ability to modify response to factors that promote anxiety will improve Outcome: Progressing   Problem: Education: Goal: Knowledge of General Education information will improve Description Including pain rating scale, medication(s)/side effects and non-pharmacologic comfort measures Outcome: Progressing   Problem: Health Behavior/Discharge  Planning: Goal: Ability to manage health-related needs will improve Outcome: Progressing   Problem: Coping: Goal: Level of anxiety will decrease Outcome: Progressing   Problem: Safety: Goal: Ability to remain free from injury will improve Outcome: Progressing

## 2019-02-11 NOTE — B[facility] Group Notes (Signed)
LCSW Group Therapy Note  02/11/2019    10:00-11:00am   Type of Therapy and Topic:  Group Therapy: Early Messages Received About Anger  Participation Level:  Active   Description of Group:   In this group, patients shared and discussed the early messages received in their lives about anger through parental or other adult modeling, teaching, repression, punishment, violence, and more.  Participants identified how those childhood lessons influence even now how they usually or often react when angered.  The group discussed that anger is a secondary emotion and what may be the underlying emotional themes that come out through anger outbursts or that are ignored through anger suppression.  Finally, as a group there was a conversation about the workbook's quote that "There is nothing wrong with anger; it is just a sign something needs to change."     Therapeutic Goals: 1. Patients will identify one or more childhood message about anger that they received and how it was taught to them. 2. Patients will discuss how these childhood experiences have influenced and continue to influence their own expression or repression of anger even today. 3. Patients will explore possible primary emotions that tend to fuel their secondary emotion of anger. 4. Patients will learn that anger itself is normal and cannot be eliminated, and that healthier coping skills can assist with resolving conflict rather than worsening situations.  Summary of Patient Progress:  The patient shared that her childhood lessons about anger were due to being in group home and witnessing a lot of fighting and having to fight herself.  As a result, she continues to be inclined to fight at the least provocation. Patient described being troubled by some of the things she witnessed while in group home. She expressed the desire to be a better person and cope with her anger in a positive manner.  Therapeutic Modalities:   Cognitive Behavioral Therapy   Motivation Interviewing  Elisabeth Pigeon

## 2019-02-11 NOTE — BHH Group Notes (Signed)
Adult Psychoeducational Group Note  Date:  02/11/2019 Time:  1:00 PM  Group Topic/Focus: Life Skills Coping With Mental Health Crisis:   The purpose of this group is to help patients identify strategies for coping with mental health crisis.  Group discusses possible causes of crisis and ways to manage them effectively.  Participation Level:  Active  Participation Quality:  Appropriate  Affect:  Appropriate  Cognitive:  Alert and Oriented  Insight: Improving  Engagement in Group:  Developing/Improving  Modes of Intervention:  Discussion, Education, Problem-solving, Socialization and Support  Additional Comments:  Patient attended group and contributed to the overall discussion.  Tyreek Clabo A Mariza Bourget 02/11/2019, 2:00 PM 

## 2019-02-12 LAB — LIPID PANEL
Cholesterol: 125 mg/dL (ref 0–200)
HDL: 29 mg/dL — ABNORMAL LOW (ref >40)
LDL CALC: 66 mg/dL (ref 0–99)
Total CHOL/HDL Ratio: 4.3 RATIO
Triglycerides: 149 mg/dL (ref <150)
VLDL: 30 mg/dL (ref 0–40)

## 2019-02-12 LAB — HEMOGLOBIN A1C
Hgb A1c MFr Bld: 5.4 % (ref 4.8–5.6)
Mean Plasma Glucose: 108.28 mg/dL

## 2019-02-12 NOTE — Progress Notes (Signed)
D Pt is observed lying on her side , in her room. She is irritable. She is sleepy. She slept, undisturbed, from approx 0930-1700. She was awakened at lunch when staff brought her lunch to her room ( per her request) but she refused to eat. FLuids have been offered to her readily today.     A She answered daily assessment questions and on this she said she denied having SI today and she rated her depression, hopelessness and anxiety " 06/05/09", respectively. She has been observed UAL in her room without her mask on ,  it is seen at this time on her bed covers and pt refuses to wear mask in her room as well as out in the milieu- despite this staff member's request that she do so. Pt refuses cough medicine and says " just leave me alone". T 98.3 orally at 1700. Pt offeed fluids generously.     R Safety in place.

## 2019-02-12 NOTE — B[facility] Group Notes (Signed)
[facility] LCSW Group Therapy Note  Date/Time:  02/12/2019 9:00-10:00 or 10:00-11:00AM  Type of Therapy and Topic:  Group Therapy:  Healthy and Unhealthy Supports  Participation Level:  Did Not Attend   Description of Group:  Patients in this group were introduced to the idea of adding a variety of healthy supports to address the various needs in their lives.Patients discussed what additional healthy supports could be helpful in their recovery and wellness after discharge in order to prevent future hospitalizations.   An emphasis was placed on using counselor, doctor, therapy groups, 12-step groups, and problem-specific support groups to expand supports.  They also worked as a group on developing a specific plan for several patients to deal with unhealthy supports through [facility], psychoeducation with loved ones, and even termination of relationships.   Therapeutic Goals:   1)  discuss importance of adding supports to stay well once out of the hospital  2)  compare healthy versus unhealthy supports and identify some examples of each  3)  generate ideas and descriptions of healthy supports that can be added  4)  offer mutual support about how to address unhealthy supports  5)  encourage active participation in and adherence to discharge plan    Summary of Patient Progress:  The patient did not attend Therapeutic Modalities:   Motivational Interviewing Brief Solution-Focused Therapy  Rolanda Jay

## 2019-02-12 NOTE — Plan of Care (Signed)
  Problem: Education: Goal: Emotional status will improve Outcome: Not Progressing   

## 2019-02-12 NOTE — Progress Notes (Signed)
Writer spoke with patient 1:1 who c/o of coughing, chest hurting from coughing and sore throat. Writer obtained orders to help with her symptoms. Writer performed flu swab on patient and sent it to lab. Lab supervisor came over with the correct nasal kits for the test to be done with. Writer will attempt to redo her test in the am since patient had fallen asleep. Safety maintained on unit with 15 min checks.

## 2019-02-12 NOTE — Progress Notes (Addendum)
[facility] MD Progress Note  02/12/2019 10:58 AM Natasha Long  MRN:  [id number] Subjective: Patient reports some improvement compared to admission.  States her mood is better.  Denies suicidal ideations. Of note, patient reports new onset cough over the last day or so. Describes it as dry at times, productive at times.  Denies any associated shortness of breath, describes feeling " tired". Currently does not endorse medication side effects.  Objective: I have reviewed chart notes and have met with patient.  23 year old female, history of mood disorder, has been diagnosed with bipolar disorder in the past, history of PTSD diagnoses, history of substance abuse.  Presented reporting depression, suicidal thoughts of overdosing.  Admission UDS positive for cocaine and cannabis.  Currently patient reports some improvement compared to how she felt at admission.  She does continue to endorse some depression and feeling "tired", which she states may be medication side effect.  Initially drowsy during today's assessment but promptly became fully alert, attentive As above, reported  new onset cough which started 1-2 days ago.No significant rhinorrhea , sneezing or odynophagia reported at present . Yesterday afebrile. Today temperature is 99.3 . Pulse Ox at room air is 99.  *Patient denies any recent travel abroad or out of state recently, denies any known exposure to anybody with known coronavirus. .  Currently she remains future oriented, is interested in going to a rehab at discharge.  Also has spoken about relocating out of state when she completes rehab. No disruptive or agitated behaviors on unit    Principal Problem: Chronic unstable mood/substance use/severe personality disorders Diagnosis: Active Problems:   MDD (major depressive disorder), recurrent severe, without psychosis ([place])  Total Time spent with patient: 20 minutes  Past Medical History:  Past Medical History:  Diagnosis Date  . Bipolar  affective, manic ([place])   . Hypertension    diagnosed as child; stopped meds at 66 yo  . Hypoglycemia   . Insomnia   . Nephrotic syndrome   . Nephrotic syndrome   . PTSD (post-traumatic stress disorder)   . Renal disease     Past Surgical History:  Procedure Laterality Date  . CHOLECYSTECTOMY    . extraction of wisdom teeth    . RENAL BIOPSY     Family History:  Family History  Problem Relation Age of Onset  . Diabetes Other   . Hypertension Other     Social History:  Social History   Substance and Sexual Activity  Alcohol Use No     Social History   Substance and Sexual Activity  Drug Use Yes  . Types: Marijuana, Cocaine    Social History   Socioeconomic History  . Marital status: Single    Spouse name: Not on file  . Number of children: 1  . Years of education: Not on file  . Highest education level: 12th grade  Occupational History  . Not on file  Social Needs  . Financial resource strain: Patient refused  . Food insecurity:    Worry: Patient refused    Inability: Patient refused  . Transportation needs:    Medical: Patient refused    Non-medical: Patient refused  Tobacco Use  . Smoking status: Current Every Day Smoker    Packs/day: 0.50    Types: Cigarettes  . Smokeless tobacco: Never Used  Substance and Sexual Activity  . Alcohol use: No  . Drug use: Yes    Types: Marijuana, Cocaine  . Sexual activity: Yes    Birth control/protection: Condom  Lifestyle  . Physical activity:    Days per week: Patient refused    Minutes per session: Patient refused  . Stress: Patient refused  Relationships  . Social connections:    Talks on phone: Patient refused    Gets together: Patient refused    Attends religious service: Patient refused    Active member of club or organization: Patient refused    Attends meetings of clubs or organizations: Patient refused    Relationship status: Patient refused  Other Topics Concern  . Not on file  Social History  Narrative  . Not on file   Additional Social History:   Sleep: Improving  Appetite:  Improving  Current Medications: Current Facility-Administered Medications  Medication Dose Route Frequency Provider Last Rate Last Dose  . acetaminophen (TYLENOL) tablet 650 mg  650 mg Oral Q6H PRN Ethelene Hal, NP   650 mg at 02/11/19 2150  . alum & mag hydroxide-simeth (MAALOX/MYLANTA) 200-200-20 MG/5ML suspension 30 mL  30 mL Oral Q4H PRN Ethelene Hal, NP      . citalopram (CELEXA) tablet 20 mg  20 mg Oral Daily Johnn Hai, MD   20 mg at 02/11/19 1057  . gabapentin (NEURONTIN) capsule 800 mg  800 mg Oral TID Yaquelin Langelier, Myer Peer, MD   800 mg at 02/11/19 1948  . guaiFENesin (MUCINEX) 12 hr tablet 1,200 mg  1,200 mg Oral BID PRN Lindon Romp A, NP   1,200 mg at 02/11/19 2138  . haloperidol (HALDOL) tablet 5 mg  5 mg Oral Once Laverle Hobby, PA-C      . hydrOXYzine (ATARAX/VISTARIL) tablet 25 mg  25 mg Oral TID PRN Ethelene Hal, NP      . magnesium hydroxide (MILK OF MAGNESIA) suspension 30 mL  30 mL Oral Daily PRN Ethelene Hal, NP      . menthol-cetylpyridinium (CEPACOL) lozenge 3 mg  1 lozenge Oral PRN Lindon Romp A, NP   3 mg at 02/11/19 2139  . nicotine (NICODERM CQ - dosed in mg/24 hours) patch 21 mg  21 mg Transdermal Daily Sharma Covert, MD   21 mg at 02/10/19 0813  . propranolol (INDERAL) tablet 10 mg  10 mg Oral BID Ethelene Hal, NP   10 mg at 02/11/19 1947  . traZODone (DESYREL) tablet 100 mg  100 mg Oral QHS PRN Tyland Klemens, Myer Peer, MD   100 mg at 02/11/19 2141    Lab Results:  Results for orders placed or performed during the hospital encounter of 02/08/19 (from the past 48 hour(s))  Pregnancy, urine     Status: None   Collection Time: 02/10/19 12:28 PM  Result Value Ref Range   Preg Test, Ur NEGATIVE NEGATIVE    Comment:        THE SENSITIVITY OF THIS METHODOLOGY IS >20 mIU/mL. Performed at [facility], [place]  [address] [place], [place] [address]   Hemoglobin A1c     Status: None   Collection Time: 02/12/19  6:26 AM  Result Value Ref Range   Hgb A1c MFr Bld 5.4 4.8 - 5.6 %    Comment: (NOTE) Pre diabetes:          5.7%-6.4% Diabetes:              >6.4% Glycemic control for   <7.0% adults with diabetes    Mean Plasma Glucose 108.28 mg/dL    Comment: Performed at [facility] [address] [place], [place]  [address]  Lipid panel     Status: Abnormal   Collection Time: 02/12/19  6:26 AM  Result Value Ref Range   Cholesterol 125 0 - 200 mg/dL   Triglycerides 149 <150 mg/dL   HDL 29 (L) >40 mg/dL   Total CHOL/HDL Ratio 4.3 RATIO   VLDL 30 0 - 40 mg/dL   LDL Cholesterol 66 0 - 99 mg/dL    Comment:        Total Cholesterol/HDL:CHD Risk Coronary Heart Disease Risk Table                     Men   Women  1/2 Average Risk   3.4   3.3  Average Risk       5.0   4.4  2 X Average Risk   9.6   7.1  3 X Average Risk  23.4   11.0        Use the calculated Patient Ratio above and the CHD Risk Table to determine the patient's CHD Risk.        ATP III CLASSIFICATION (LDL):  <100     mg/dL   Optimal  100-129  mg/dL   Near or Above                    Optimal  130-159  mg/dL   Borderline  160-189  mg/dL   High  >190     mg/dL   Very High Performed at [facility] [address] [place], [place] [address]     Blood Alcohol level:  Lab Results  Component Value Date   ETH <10 02/08/2019   ETH <10 63/84/6659    Metabolic Disorder Labs: Lab Results  Component Value Date   HGBA1C 5.4 02/12/2019   MPG 108.28 02/12/2019   No results found for: PROLACTIN Lab Results  Component Value Date   CHOL 125 02/12/2019   TRIG 149 02/12/2019   HDL 29 (L) 02/12/2019   CHOLHDL 4.3 02/12/2019   VLDL 30 02/12/2019   LDLCALC 66 02/12/2019   LDLCALC 77 09/15/2017    Physical Findings: AIMS: Facial and Oral Movements Muscles of Facial Expression: None, normal Lips and  Perioral Area: None, normal Jaw: None, normal Tongue: None, normal,Extremity Movements Upper (arms, wrists, hands, fingers): None, normal Lower (legs, knees, ankles, toes): None, normal, Trunk Movements Neck, shoulders, hips: None, normal, Overall Severity Severity of abnormal movements (highest score from questions above): None, normal Incapacitation due to abnormal movements: None, normal Patient's awareness of abnormal movements (rate only patient's report): No Awareness, Dental Status Current problems with teeth and/or dentures?: No Does patient usually wear dentures?: No  CIWA:    COWS:     Musculoskeletal: Strength & Muscle Tone: within normal limits-no psychomotor agitation or restlessness noted Gait & Station: normal Patient leans: N/A  Psychiatric Specialty Exam: Physical Exam  ROS no odynophagia, no  chest pain, no shortness of breath, no vomiting, no chills Temp 99.3, Pulse Ox at room air   Blood pressure 116/69, pulse 72, temperature 98.4 F (36.9 C), temperature source Oral, resp. rate 18, height '5\' 8"'  (1.727 m), weight (!) 137.9 kg, SpO2 98 %, unknown if currently breastfeeding.Body mass index is 46.22 kg/m.  General Appearance: Casual  Eye Contact:  Good  Speech:  Normal Rate  Volume:  Normal  Mood:  describes partially improved mood   Affect:  remains blunted, does smile briefly at times   Thought Process:  Linear and  Descriptions of Associations: Intact  Orientation:  Other:  Fully alert and attentive  Thought Content:  no hallucinations, no delusions   Suicidal Thoughts:  No denies current suicidal ideations and as above presents future oriented at the present time  Homicidal Thoughts:  No  Memory:  Recent and remote grossly intact  Judgement:  Fair/ improving   Insight:  Fair/improving   Psychomotor Activity:  Normal-no restlessness or agitation  Concentration:  Concentration: Fair and Attention Span: Fair  Recall:  [employer] of Knowledge:  Fair   Language:  Fair  Akathisia:  Negative  Handed:  Right  AIMS (if indicated):     Assets:  Physical Health Resilience  ADL's:  Intact  Cognition:  WNL  Sleep:  Number of Hours: 6.75    Assessment_  23 year old female, history of mood disorder, has been diagnosed with bipolar disorder in the past, history of PTSD diagnoses, history of substance abuse.  Presented reporting depression, suicidal thoughts of overdosing.  Admission UDS positive for cocaine and cannabis.   Patient is reporting partial improvement compared to admission. Currently denies SI and is future oriented . Describes new onset cough since yesterday and today reports feeling " tired", temperature 99.3, pulse ox 99 at room air , does not appear to be in any acute distress .  No medication side effects.    Treatment Plan Summary: Encourage group and milieu participation to work on coping skills and symptom reduction Encourage efforts to work on sobriety and relapse prevention Treatment team working on disposition planning options-as above, patient expressing interest in going to a rehab at discharge Continue Celexa 20 mg daily for depression/I anxiety Continue Neurontin 800 mg 3 times daily for anxiety, pain Patient received her monthly dose of Abilify Maintena 400 mg IM on 3/12  Continue Trazodone to 100 mg nightly PRN for insomnia Patient has been requested to remain in room/isolation and influenza testing was ordered Royce Macadamia pending. Importance of this precaution has been reviewed with patient. I spoke with ED physician ( yesterday afternoon) and today with ID Specialist on call for consults - based on patient's history and absence of travel to high risk areas /known contacts, no Covid 19 testing would be indicated at this time.   Will monitor Vitals /temp regularly. Encourage PO fluids .   Jenne Campus, MD 02/12/2019, 10:58 AM   Patient ID: Natasha Long, female   DOB: [birth year], 23 y.o.   MRN: [id number]

## 2019-02-13 LAB — INFLUENZA PANEL BY PCR (TYPE A & B)
INFLAPCR: NEGATIVE
Influenza B By PCR: NEGATIVE

## 2019-02-13 MED ORDER — GABAPENTIN 800 MG PO TABS
800.0000 mg | ORAL_TABLET | Freq: Three times a day (TID) | ORAL | Status: DC
  Filled 2019-02-13 (×2): qty 42

## 2019-02-13 MED ORDER — TRAZODONE HCL 100 MG PO TABS: 100.0000 mg | ORAL_TABLET | Freq: Every evening | ORAL | 0 refills | Status: DC | PRN

## 2019-02-13 MED ORDER — HYDROXYZINE HCL 25 MG PO TABS: 25.0000 mg | ORAL_TABLET | Freq: Three times a day (TID) | ORAL | 0 refills | Status: DC | PRN

## 2019-02-13 MED ORDER — ACETAMINOPHEN 325 MG PO TABS: 650.0000 mg | ORAL_TABLET | Freq: Four times a day (QID) | ORAL | Status: DC | PRN

## 2019-02-13 MED ORDER — GABAPENTIN 400 MG PO CAPS: 800.0000 mg | ORAL_CAPSULE | Freq: Three times a day (TID) | ORAL | 0 refills | Status: DC

## 2019-02-13 MED ORDER — CITALOPRAM HYDROBROMIDE 20 MG PO TABS: 20.0000 mg | ORAL_TABLET | Freq: Every day | ORAL | 0 refills | Status: DC

## 2019-02-13 MED ORDER — NICOTINE 21 MG/24HR TD PT24: 21.0000 mg | MEDICATED_PATCH | Freq: Every day | TRANSDERMAL | 0 refills | Status: DC

## 2019-02-13 MED ORDER — PROPRANOLOL HCL 10 MG PO TABS: 10.0000 mg | ORAL_TABLET | Freq: Two times a day (BID) | ORAL | 0 refills | Status: DC

## 2019-02-13 NOTE — Progress Notes (Signed)
[facility] MD Progress Note  02/13/2019 9:09 AM Natasha Long  MRN:  [id number] Subjective:   Patient reports that her mood is generally stable she has no thoughts of harming herself she is more focused on her rehab and as reflected in notes and recent discussions patient will indeed be going to [facility] recovery services tomorrow.  Requests no medication changes.  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 Bipolar affective, manic ([place])   . Hypertension    diagnosed as child; stopped meds at 1 yo  . Hypoglycemia   . Insomnia   . Nephrotic syndrome   . Nephrotic syndrome   . PTSD (post-traumatic stress disorder)   . Renal disease     Past Surgical History:  Procedure Laterality Date  . CHOLECYSTECTOMY    . extraction of wisdom teeth    . RENAL BIOPSY     Family History:  Family History  Problem Relation Age of Onset  . Diabetes Other   . Hypertension Other     Social History   Substance and Sexual Activity  Alcohol Use No     Social History   Substance and Sexual Activity  Drug Use Yes  . Types: Marijuana, Cocaine    Social History   Socioeconomic History  . Marital status: Single    Spouse name: Not on file  . Number of children: 1  . Years of education: Not on file  . Highest education level: 12th grade  Occupational History  . Not on file  Social Needs  . Financial resource strain: Patient refused  . Food insecurity:    Worry: Patient refused    Inability: Patient refused  . Transportation needs:    Medical: Patient refused    Non-medical: Patient refused  Tobacco Use  . Smoking status:  Current Every Day Smoker    Packs/day: 0.50    Types: Cigarettes  . Smokeless tobacco: Never Used  Substance and Sexual Activity  . Alcohol use: No  . Drug use: Yes    Types: Marijuana, Cocaine  . Sexual activity: Yes    Birth control/protection: Condom  Lifestyle  . Physical activity:    Days per week: Patient refused    Minutes per session: Patient refused  . Stress: Patient refused  Relationships  . Social connections:    Talks on phone: Patient refused    Gets together: Patient refused    Attends religious service: Patient refused    Active member of club or organization: Patient refused    Attends meetings of clubs or organizations: Patient refused    Relationship status: Patient refused  Other Topics Concern  . Not on file  Social History Narrative  . Not on file   Additional Social History:                         Sleep: Good  Appetite:  Good  Current Medications: Current Facility-Administered Medications  Medication Dose Route Frequency Provider Last Rate Last Dose  . acetaminophen (TYLENOL) tablet 650 mg  650 mg Oral Q6H PRN Ethelene Hal, NP   650 mg at 02/11/19 2150  . alum & mag hydroxide-simeth (MAALOX/MYLANTA)  200-200-20 MG/5ML suspension 30 mL  30 mL Oral Q4H PRN Ethelene Hal, NP      . citalopram (CELEXA) tablet 20 mg  20 mg Oral Daily Johnn Hai, MD   20 mg at 02/12/19 1148  . gabapentin (NEURONTIN) capsule 800 mg  800 mg Oral TID Cobos, Myer Peer, MD   800 mg at 02/12/19 1736  . guaiFENesin (MUCINEX) 12 hr tablet 1,200 mg  1,200 mg Oral BID PRN Lindon Romp A, NP   1,200 mg at 02/11/19 2138  . haloperidol (HALDOL) tablet 5 mg  5 mg Oral Once Laverle Hobby, PA-C      . hydrOXYzine (ATARAX/VISTARIL) tablet 25 mg  25 mg Oral TID PRN Ethelene Hal, NP   25 mg at 02/12/19 2127  . magnesium hydroxide (MILK OF MAGNESIA) suspension 30 mL  30 mL Oral Daily PRN Ethelene Hal, NP      . menthol-cetylpyridinium  (CEPACOL) lozenge 3 mg  1 lozenge Oral PRN Lindon Romp A, NP   3 mg at 02/11/19 2139  . nicotine (NICODERM CQ - dosed in mg/24 hours) patch 21 mg  21 mg Transdermal Daily Sharma Covert, MD   21 mg at 02/10/19 0813  . propranolol (INDERAL) tablet 10 mg  10 mg Oral BID Ethelene Hal, NP   10 mg at 02/12/19 1736  . traZODone (DESYREL) tablet 100 mg  100 mg Oral QHS PRN Cobos, Myer Peer, MD   100 mg at 02/12/19 2128    Lab Results:  Results for orders placed or performed during the hospital encounter of 02/08/19 (from the past 48 hour(s))  Hemoglobin A1c     Status: None   Collection Time: 02/12/19  6:26 AM  Result Value Ref Range   Hgb A1c MFr Bld 5.4 4.8 - 5.6 %    Comment: (NOTE) Pre diabetes:          5.7%-6.4% Diabetes:              >6.4% Glycemic control for   <7.0% adults with diabetes    Mean Plasma Glucose 108.28 mg/dL    Comment: Performed at [facility], [place] [address], [place], [place] [address]  Lipid panel     Status: Abnormal   Collection Time: 02/12/19  6:26 AM  Result Value Ref Range   Cholesterol 125 0 - 200 mg/dL   Triglycerides 149 <150 mg/dL   HDL 29 (L) >40 mg/dL   Total CHOL/HDL Ratio 4.3 RATIO   VLDL 30 0 - 40 mg/dL   LDL Cholesterol 66 0 - 99 mg/dL    Comment:        Total Cholesterol/HDL:CHD Risk Coronary Heart Disease Risk Table                     Men   Women  1/2 Average Risk   3.4   3.3  Average Risk       5.0   4.4  2 X Average Risk   9.6   7.1  3 X Average Risk  23.4   11.0        Use the calculated Patient Ratio above and the CHD Risk Table to determine the patient's CHD Risk.        ATP III CLASSIFICATION (LDL):  <100     mg/dL   Optimal  100-129  mg/dL   Near or Above  Optimal  130-159  mg/dL   Borderline  160-189  mg/dL   High  >190     mg/dL   Very High Performed at [facility] [address], [place], [place] [address]   Influenza panel by PCR (type A & B)     Status: None    Collection Time: 02/12/19  8:00 AM  Result Value Ref Range   Influenza A By PCR NEGATIVE NEGATIVE   Influenza B By PCR NEGATIVE NEGATIVE    Comment: (NOTE) The Xpert Xpress Flu assay is intended as an aid in the diagnosis of  influenza and should not be used as a sole basis for treatment.  This  assay is FDA approved for nasopharyngeal swab specimens only. Nasal  washings and aspirates are unacceptable for Xpert Xpress Flu testing. Performed at [facility], [place] [address] [place], [place] [address]     Blood Alcohol level:  Lab Results  Component Value Date   ETH <10 02/08/2019   ETH <10 72/53/6644    Metabolic Disorder Labs: Lab Results  Component Value Date   HGBA1C 5.4 02/12/2019   MPG 108.28 02/12/2019   No results found for: PROLACTIN Lab Results  Component Value Date   CHOL 125 02/12/2019   TRIG 149 02/12/2019   HDL 29 (L) 02/12/2019   CHOLHDL 4.3 02/12/2019   VLDL 30 02/12/2019   LDLCALC 66 02/12/2019   LDLCALC 77 09/15/2017    Physical Findings: AIMS: Facial and Oral Movements Muscles of Facial Expression: None, normal Lips and Perioral Area: None, normal Jaw: None, normal Tongue: None, normal,Extremity Movements Upper (arms, wrists, hands, fingers): None, normal Lower (legs, knees, ankles, toes): None, normal, Trunk Movements Neck, shoulders, hips: None, normal, Overall Severity Severity of abnormal movements (highest score from questions above): None, normal Incapacitation due to abnormal movements: None, normal Patient's awareness of abnormal movements (rate only patient's report): No Awareness, Dental Status Current problems with teeth and/or dentures?: No Does patient usually wear dentures?: No  CIWA:    COWS:     Musculoskeletal: Strength & Muscle Tone: within normal limits Gait & Station: normal Patient leans: N/A  Psychiatric Specialty Exam: Physical Exam  ROS  Blood pressure 115/88, pulse 80, temperature 98.3 F  (36.8 C), resp. rate 18, height 5\' 8"  (1.727 m), weight (!) 137.9 kg, SpO2 98 %, unknown if currently breastfeeding.Body mass index is 46.22 kg/m.  General Appearance: Casual  Eye Contact:  Good  Speech:  Clear and Coherent  Volume:  Decreased  Mood:  Euthymic  Affect:  Constricted  Thought Process:  Coherent  Orientation:  Full (Time, Place, and Person)  Thought Content:  Tangential  Suicidal Thoughts:  No  Homicidal Thoughts:  No  Memory:  Immediate;   Fair  Judgement:  Fair  Insight:  Fair  Psychomotor Activity:  Normal  Concentration:  Concentration: Fair  Recall:  [employer] of Knowledge:  Fair  Language:  Fair  Akathisia:  Negative  Handed:  Right  AIMS (if indicated):     Assets:  Resilience  ADL's:  Intact  Cognition:  WNL  Sleep:  Number of Hours: 6.75     Treatment Plan Summary: Daily contact with patient to assess and evaluate symptoms and progress in treatment and Medication management generally stable now in mood and affect plans are to discharge tomorrow no change in meds or precautions or current group therapies  Snyder Colavito, MD 02/13/2019, 9:09 AM

## 2019-02-13 NOTE — Progress Notes (Signed)
  [facility] Adult Case Management Discharge Plan :  Will you be returning to the same living situation after discharge:  No. Going to [facility]  At discharge, do you have transportation home?: Yes,  taxi voucher Do you have the ability to pay for your medications: Yes,  Medicaid  Release of information consent forms completed and in the chart. Taxi voucher on chart.   Patient to Follow up at: Follow-up Information    [facility] Follow up on 02/15/2019.   Why:  Hospital follow up appointment is Wednesday, 3/18 at 8:30a.  Pleae bring your current medications and discharge paperwork from this hospitalization.  Contact information: [place] [address] [place] Follow up.   Specialty:  Addiction Medicine Contact information: [place] [address] [phone]        Services, [facility] Follow up.   Why:  Your intake screening is on Monday, 02/13/2019 at 7:45am. Please bring your hospital discharge paperwork and a 28 day supply of medications.  Contact information: [place] [address] [phone]           Next level of care provider has access to [place] and Suicide Prevention discussed: Yes,  with patient  Have you used any form of tobacco in the last 30 days? (Cigarettes, Smokeless Tobacco, Cigars, and/or Pipes): Yes  Has patient been referred to the Quitline?: Patient refused referral  Patient has been referred for addiction treatment: Yes  Joellen [place], [place] 02/13/2019, 3:45 PM

## 2019-02-13 NOTE — Plan of Care (Signed)
Progress note  D: pt found in bed; allowed to rest. Pt denies any physical pain or symptoms, and is animated in her interaction. Pt is planning to be discharged tomorrow. Pt denies si/hi/ah/vh and verbally agrees to approach staff if these become apparent or before harming herself/others while at [facility]. Pt states she slept well.  A: pt provided support and encouragement. Pt given medication per protocol and standing orders. Q14m safety checks implemented and continued.  R: pt safe on the unit. Will continue to monitor.   Pt progressing in the following metrics  Problem: Education: Goal: Knowledge of [facility] [place] [facility] Education information/materials will improve Outcome: Progressing Goal: Emotional status will improve Outcome: Progressing Goal: Mental status will improve Outcome: Progressing Goal: Verbalization of understanding the information provided will improve Outcome: Progressing

## 2019-02-13 NOTE — Progress Notes (Signed)
CSW contacted [facility] residential. Patient had an intake screening this morning at 7:45am. Chinita Pester is able to reschedule the patient for tomorrow, 03/17.   Patient will need discharge orders in this afternoon.  She will need a 28 day supply of medications.  She will need to take a taxi to [facility] at 7:00am tomorrow. CSW will leave a completed taxi voucher on patient's chart this afternoon.  Stephanie Acre, LCSW-A Clinical Social Worker

## 2019-02-13 NOTE — Progress Notes (Signed)
Pt invited.  Did not attend group.    spiritual care group on grief and loss facilitated by chaplain Jerene Pitch and PhD counseling intern, Kerry Hough   Group opened with brief discussion and psycho-social ed around grief and loss in relationships and in relation to self - identifying life patterns, circumstances, changes that cause losses. Established group norm of speaking from own life experience. Group goal of establishing open and affirming space for members to share loss and experience with grief, normalize grief experience and provide psycho social education and grief support.  Group drew on Worden's four tasks of grief

## 2019-02-13 NOTE — Progress Notes (Signed)
Recreation Therapy Notes  Date:  3.16.20 Time: 0930 Location: [address] Dayroom  Group Topic: Stress Management  Goal Area(s) Addresses:  Patient will identify positive stress management techniques. Patient will identify benefits of using stress management post d/c.  Behavioral Response: Engaged  Intervention: Stress Management  Activity :  Guided Imagery.  LRT introduced the stress management technique of guided imagery.  LRT read a script that patients on a journey at the beach to envision the peaceful waves.  Patients were to listen and follow along as script was read.  Education:  Stress Management, Discharge Planning.   Education Outcome: Acknowledges Education  Clinical Observations/Feedback:  Pt attended and participated in group session.    Victorino Sparrow, LRT/CTRS         Victorino Sparrow A 02/13/2019 10:38 AM

## 2019-02-14 DIAGNOSIS — F3489 Other specified persistent mood disorders: Secondary | ICD-10-CM

## 2019-02-14 NOTE — Progress Notes (Signed)
Pt discharged to [facility] on [facility] cab. Pt was ambulatory,stable and appreciative at that time. All papers,  Prescriptions and medication sample were given and valuables returned. Verbal understanding expressed. Denies SI/HI and A/VH. Pt given opportunity to express concerns and ask questions.

## 2019-02-14 NOTE — BHH Suicide Risk Assessment (Signed)
[facility] Discharge Suicide Risk Assessment   Principal Problem: Threats against self chronic self-harm behaviors chronic volatile mood severe personality disorder/substance abuse Discharge Diagnoses: Active Problems:   MDD (major depressive disorder), recurrent severe, without psychosis ([place])   Total Time spent with patient: 45 minutes Mental Status Per Nursing Assessment::   On Admission:  Self-harm thoughts Table for release to rehab alert oriented irritable at the delay in her discharge that she perceived but that is not accurate- no thoughts of harming self or others no acute psychosis Demographic Factors:  Low socioeconomic status  Loss Factors: Decrease in vocational status  Historical Factors: Impulsivity  Risk Reduction Factors:   NA  Continued Clinical Symptoms:  Alcohol/Substance Abuse/Dependencies  Cognitive Features That Contribute To Risk:  Thought constriction (tunnel vision)    Suicide Risk:  Mild:  Suicidal ideation of limited frequency, intensity, duration, and specificity.  There are no identifiable plans, no associated intent, mild dysphoria and related symptoms, good self-control (both objective and subjective assessment), few other risk factors, and identifiable protective factors, including available and accessible social support.  Follow-up Information    [facility] Follow up on 02/15/2019.   Why:  Hospital follow up appointment is Wednesday, 3/18 at 8:30a.  Pleae bring your current medications and discharge paperwork from this hospitalization.  Contact information: [place] [address] [place] Follow up.   Specialty:  Addiction Medicine Contact information: [place] [address] [phone]        Services, [facility] Follow up.   Why:  Your intake screening is on Monday, 02/13/2019 at 7:45am. Please bring your hospital discharge paperwork and a 28 day  supply of medications.  Contact information: [address] [place] [address] [phone]           Plan Of Care/Follow-up recommendations:  Activity:  full  Tevis Conger, MD 02/14/2019, 6:34 AM

## 2019-02-14 NOTE — Discharge Summary (Signed)
Physician Discharge Summary Note  Patient:  Natasha Long is an 23 y.o., female MRN:  [id number] DOB:  09/19/1996 Patient phone:  [phone] (home)  Patient address:   [place] [address],  Total Time spent with patient: 45 minutes  Date of Admission:  02/08/2019 Date of Discharge: 02/14/19  Reason for Admission:   This is the latest of multiple admissions/encounters with this 23 year old homeless individual who is carried multiple diagnoses to include bipolar disorder, depression, chemical dependency's, PTSD, chronic cutting behaviors, and it is the consensus of the treatment team that her principal issues are related to the 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 She once again presented through the emerge department complaining of suicidal thinking, plans to overdose on gabapentin, and abusing synthetic cannabis as well as cocaine with drug screen reflecting both compounds.  Despite this reported compliance with psychotropic medications. Last admission here 12/17 to 11/18/2018 and 01/13/19 to 01/16/2019-  On exam the patient is sluggish and poorly cooperative she is alert though she will make no eye contact, oriented to person place and situation when asked if she is suicidal states she might be would not answer the question directly but when asked if she has any direct plans to harm her self while here she does not and she can contract for safety. She denied auditory and visual hallucinations, denied cravings for cocaine  According to the assessment team note of yesterday-  Natasha Edwardsis an 23 y.o.femalepresenting with SI with plan to overdose on gabepentin. Patient was admitted to [facility] on 01/13/19 for SI with plan. Patient reported onset of SI and depression since last inpatient treatment at [facility]. Patient reported, "I don't want to go back to [facility] because the doctors set me off, they are a trigger, I want to go back to [facility], then after that go back to [facility]". Patient reported history of 3x suicide attempts, overdose, jumped off roof and slice veins. Patient reported history of self-cutting behaviors since 23 years old, last time few weeks ago. Patient reported intent of self-cutting "endorphine release". Patient reported stressful situations, 37 year old son living with aunt, patient being homeless, mental illness and break up from boyfriend. Prior inpatient treatment includes  Principal Problem: Mood instability Discharge Diagnoses: Active Problems:   MDD (major depressive disorder), recurrent severe, without psychosis ([place])  Past Medical History:  Diagnosis Date  . Bipolar affective, manic ([place])   . Hypertension    diagnosed as child; stopped meds at 46 yo  . Hypoglycemia   . Insomnia   . Nephrotic syndrome   . Nephrotic syndrome   . PTSD (post-traumatic stress disorder)   . Renal disease     Past Surgical History:  Procedure Laterality Date  . CHOLECYSTECTOMY    . extraction of wisdom teeth    . RENAL BIOPSY     Family History:  Family History  Problem Relation Age of Onset  . Diabetes Other   . Hypertension Other    Social History:  Social History   Substance and Sexual Activity  Alcohol Use No     Social History   Substance and Sexual Activity  Drug Use Yes  . Types: Marijuana, Cocaine    Social History   Socioeconomic History  . Marital status: Single    Spouse name: Not on file  . Number of children: 1  . Years of education: Not on file  . Highest education level: 12th grade  Occupational History  .  Not on file  Social Needs  . Financial resource strain: Patient refused  . Food insecurity:    Worry: Patient refused    Inability: Patient refused  . Transportation needs:    Medical: Patient refused    Non-medical: Patient refused  Tobacco Use  . Smoking status: Current Every Day Smoker    Packs/day: 0.50    Types: Cigarettes  . Smokeless tobacco:  Never Used  Substance and Sexual Activity  . Alcohol use: No  . Drug use: Yes    Types: Marijuana, Cocaine  . Sexual activity: Yes    Birth control/protection: Condom  Lifestyle  . Physical activity:    Days per week: Patient refused    Minutes per session: Patient refused  . Stress: Patient refused  Relationships  . Social connections:    Talks on phone: Patient refused    Gets together: Patient refused    Attends religious service: Patient refused    Active member of club or organization: Patient refused    Attends meetings of clubs or organizations: Patient refused    Relationship status: Patient refused  Other Topics Concern  . Not on file  Social History Narrative  . Not on file    Hospital Course:   Patient was admitted under routine precautions displayed no dangerous behaviors here and was optically agitated or disrespectful toward staff and there was less staff splitting on this occasion.  We settled on fluoxetine for depressive symptoms and irritability we attempted to give her topiramate because she wanted weight loss but she stated after while she was on "too many medications" and this was discontinued at her request. By the date of the 17th she had rehab arranged she was alert oriented and cooperative a little bit irritable because she expected her discharge be delayed but that was not the case but she had no thoughts of harming self or others and was contracting fully no acute psychosis no cravings tremors or withdrawal symptoms  Physical Findings: AIMS: Facial and Oral Movements Muscles of Facial Expression: None, normal Lips and Perioral Area: None, normal Jaw: None, normal Tongue: None, normal,Extremity Movements Upper (arms, wrists, hands, fingers): None, normal Lower (legs, knees, ankles, toes): None, normal, Trunk Movements Neck, shoulders, hips: None, normal, Overall Severity Severity of abnormal movements (highest score from questions above): None,  normal Incapacitation due to abnormal movements: None, normal Patient's awareness of abnormal movements (rate only patient's report): No Awareness, Dental Status Current problems with teeth and/or dentures?: No Does patient usually wear dentures?: No  CIWA:    COWS:     Musculoskeletal: Strength & Muscle Tone: within normal limits Gait & Station: normal Patient leans: N/A  Psychiatric Specialty Exam: Physical Exam  ROS  Blood pressure 122/85, pulse 82, temperature 98.3 F (36.8 C), temperature source Oral, resp. rate 18, height 5\' 8"  (1.727 m), weight (!) 137.9 kg, SpO2 98 %, unknown if currently breastfeeding.Body mass index is 46.22 kg/m.  General Appearance: Casual  Eye Contact:  Fair  Speech:  Clear and Coherent  Volume:  Normal  Mood:  Irritable  Affect: Congruent  Thought Process:  Coherent  Orientation:  Full (Time, Place, and Person)  Thought Content:  Tangential  Suicidal Thoughts:  No  Homicidal Thoughts:  No  Memory:  Immediate;   Fair  Judgement:  Intact  Insight:  Good  Psychomotor Activity:  Normal  Concentration:  Concentration: Fair  Recall:  [employer] of Knowledge:  Fair  Language:  Fair  Akathisia:  Negative  Handed:  Right  AIMS (if indicated):     Assets:  Leisure Time Physical Health Resilience  ADL's:  Intact  Cognition:  WNL  Sleep:  Number of Hours: 6.25     Have you used any form of tobacco in the last 30 days? (Cigarettes, Smokeless Tobacco, Cigars, and/or Pipes): Yes  Has this patient used any form of tobacco in the last 30 days? (Cigarettes, Smokeless Tobacco, Cigars, and/or Pipes) Yes, No  Blood Alcohol level:  Lab Results  Component Value Date   ETH <10 02/08/2019   ETH <10 01/75/1025    Metabolic Disorder Labs:  Lab Results  Component Value Date   HGBA1C 5.4 02/12/2019   MPG 108.28 02/12/2019   No results found for: PROLACTIN Lab Results  Component Value Date   CHOL 125 02/12/2019   TRIG 149 02/12/2019   HDL 29 (L)  02/12/2019   CHOLHDL 4.3 02/12/2019   VLDL 30 02/12/2019   LDLCALC 66 02/12/2019   LDLCALC 77 09/15/2017    See Psychiatric Specialty Exam and Suicide Risk Assessment completed by Attending Physician prior to discharge.  Discharge destination: [facility]  Is patient on multiple antipsychotic therapies at discharge:  No   Has Patient had three or more failed trials of antipsychotic monotherapy by history:  No  Recommended Plan for Multiple Antipsychotic Therapies: NA  Discharge Instructions    Discharge instructions   Complete by:  As directed    Patient is instructed to take all prescribed medications as recommended. Report any side effects or adverse reactions to your outpatient psychiatrist. Patient is instructed to abstain from alcohol and illegal drugs while on prescription medications. In the event of worsening symptoms, patient is instructed to call the crisis hotline, 911, or go to the nearest emergency department for evaluation and treatment.     Allergies as of 02/14/2019      Reactions   Prednisone Other (See Comments)   Pt states that this med caused pancreatitis.    Prozac [fluoxetine Hcl] Other (See Comments)   Panic attack    Wellbutrin [bupropion] Other (See Comments)   Panic attack      Medication List    STOP taking these medications   ARIPiprazole 10 MG tablet Commonly known as:  ABILIFY     TAKE these medications     Indication  citalopram 20 MG tablet Commonly known as:  CELEXA Take 1 tablet (20 mg total) by mouth daily. For mood  Indication:  Mood   gabapentin 400 MG capsule Commonly known as:  NEURONTIN Take 2 capsules (800 mg total) by mouth 3 (three) times daily.  Indication:  Abuse or Misuse of Alcohol   hydrOXYzine 25 MG tablet Commonly known as:  ATARAX/VISTARIL Take 1 tablet (25 mg total) by mouth 3 (three) times daily as needed for anxiety.  Indication:  Feeling Anxious   nicotine 21 mg/24hr patch Commonly known as:  NICODERM CQ -  dosed in mg/24 hours Place 1 patch (21 mg total) onto the skin daily. For smoking cessation  Indication:  Nicotine Addiction   propranolol 10 MG tablet Commonly known as:  INDERAL Take 1 tablet (10 mg total) by mouth 2 (two) times daily.  Indication:  Anxiety   traZODone 100 MG tablet Commonly known as:  DESYREL Take 1 tablet (100 mg total) by mouth at bedtime as needed for sleep.  Indication:  Trouble Sleeping      Follow-up Information    [facility] Follow up on 02/15/2019.  Why:  Hospital follow up appointment is Wednesday, 3/18 at 8:30a.  Pleae bring your current medications and discharge paperwork from this hospitalization.  Contact information: [place] [address] [place] Follow up.   Specialty:  Addiction Medicine Contact information: [place] [address] [phone]        Services, [facility] Follow up.   Why:  Your intake screening is on Monday, 02/13/2019 at 7:45am. Please bring your hospital discharge paperwork and a 28 day supply of medications.  Contact information: [place] [address] [phone]          Signed: Johnn Hai, MD 02/14/2019, 6:54 AM

## 2019-02-17 ENCOUNTER — Encounter: Payer: Self-pay | Admitting: Pediatric Intensive Care

## 2019-02-17 MED FILL — traZODone HCL 100 MG TABS: 100 | 15 days supply | Qty: 15 | Fill #0

## 2019-02-17 MED FILL — NICOTINE 21 MG/24HR PATCH: 21 | 28 days supply | Qty: 28 | Fill #0

## 2019-02-17 MED FILL — CITALOPRAM HBR 20 MG TABLET: 20 | 30 days supply | Qty: 30 | Fill #0

## 2019-02-17 MED FILL — hydrOXYzine HCL 25 MG TABS: 25 | 10 days supply | Qty: 30 | Fill #0

## 2019-02-17 MED FILL — PROPRANOLOL HCL 10 MG TAB: 10 | 30 days supply | Qty: 60 | Fill #0

## 2019-02-17 MED FILL — GABAPENTIN 400 MG CAPSULE: 400 | 15 days supply | Qty: 93 | Fill #0

## 2019-02-22 ENCOUNTER — Encounter: Payer: Self-pay | Admitting: Critical Care Medicine

## 2019-02-22 ENCOUNTER — Other Ambulatory Visit: Payer: Self-pay | Admitting: Critical Care Medicine

## 2019-02-22 DIAGNOSIS — S62337K Displaced fracture of neck of fifth metacarpal bone, left hand, subsequent encounter for fracture with nonunion: Secondary | ICD-10-CM

## 2019-02-22 NOTE — Progress Notes (Signed)
Needs ortho referral for fracture of Left hand.

## 2019-02-23 NOTE — Progress Notes (Signed)
Patient ID: Natasha Long, female   DOB: [birth year], 23 y.o.   MRN: [id number]  This is a 23 year old female who we have seen previously in the [facility].  The patient had fractured the fifth metacarpal bone of her left hand after hitting a door when she was in the behavioral health hospital back in February 2020.  The patient was seen in the emergency room but the referral to the orthopedic office did not go through.  She comes in today with the same cast she has had on her hands since February.  She complains the pain is increasing.  She had been to the emergency room about a week ago and a repeat x-ray there showed there was more angulation to the fracture and it is not healing.  On exam she does have a short arm cast to the left arm and hand.  I did not take the cast off to examine the hand.  I reviewed the x-rays in the [facility] system and it does document more angulation to the fracture near the neck of the fifth metacarpal bone of the left hand.  This has the appearance of a boxer type fracture.  Note the patient does have bipolar disorder and longstanding history of mental health conditions with frequent admissions to behavioral health  The patient is a former [facility] patient for primary care  I made another referral to [facility] to evaluate the left hand and sent this referral in through our referral network at the community care clinics

## 2019-02-27 ENCOUNTER — Telehealth (INDEPENDENT_AMBULATORY_CARE_PROVIDER_SITE_OTHER): Payer: Self-pay

## 2019-02-27 NOTE — Telephone Encounter (Signed)
Called patient.    Do you have now or have you had in the past 7 days a fever and/or chills?   Do you have now or have you had in the past 7 days a cough?   Do you have now or have you had in the last 7 days nausea, vomiting or abdominal pain?   Have you been exposed to anyone who has tested positive for COVID-19?   Have you or anyone who lives with you traveled within the last month?

## 2019-02-28 ENCOUNTER — Other Ambulatory Visit: Payer: Self-pay

## 2019-02-28 ENCOUNTER — Encounter (INDEPENDENT_AMBULATORY_CARE_PROVIDER_SITE_OTHER): Payer: Self-pay | Admitting: Orthopaedic Surgery

## 2019-02-28 ENCOUNTER — Ambulatory Visit (INDEPENDENT_AMBULATORY_CARE_PROVIDER_SITE_OTHER): Payer: Medicaid Other

## 2019-02-28 ENCOUNTER — Ambulatory Visit (INDEPENDENT_AMBULATORY_CARE_PROVIDER_SITE_OTHER): Payer: Medicaid Other | Admitting: Orthopaedic Surgery

## 2019-02-28 DIAGNOSIS — S62327A Displaced fracture of shaft of fifth metacarpal bone, left hand, initial encounter for closed fracture: Secondary | ICD-10-CM | POA: Insufficient documentation

## 2019-02-28 NOTE — Progress Notes (Signed)
Office Visit Note   Patient: Natasha Long           Date of Birth: 1996-11-12           MRN: [id number] Visit Date: 02/28/2019              Requested by: Natasha Stain, MD [address] [place], [place] [address] PCP: Natasha Demark, PA-C   Assessment & Plan: Visit Diagnoses:  1. Closed displaced fracture of shaft of fifth metacarpal bone of left hand, initial encounter     Plan: Impression is subacute left 5th metacarpal fracture with callus formation. At this point, I think she has a good chance of doing quite well if we allow this to continue to heal and just get her some hand therapy.  She has been immobilized for the last 6 weeks which I think is the may issue.  We have made referral to outpatient hand therapy.  We will recheck her in 6 weeks with repeat left hand xrays.  We did discuss the pros and cons of surgical correction and she decided for non-op treatment.  Patient in agreement. Total face to face encounter time was greater than 45 minutes and over half of this time was spent in counseling and/or coordination of care.  Follow-Up Instructions: Return in about 6 weeks (around 04/11/2019).   Orders:  Orders Placed This Encounter  Procedures  . XR Hand Complete Left   No orders of the defined types were placed in this encounter.     Procedures: No procedures performed   Clinical Data: No additional findings.   Subjective: Chief Complaint  Patient presents with  . Right Hand - Follow-up    Natasha Long is a 23 year old female who punched a door 6 weeks ago with her left hand.  She has been in a ulnar gutter splint since then.  She comes in today for further evaluation and treatment.  She states she has soreness with movement of the small finger but no significant pain.  Denies numbness and tingling.   Review of Systems  Constitutional: Negative.   HENT: Negative.   Eyes: Negative.   Respiratory: Negative.   Cardiovascular: Negative.    Endocrine: Negative.   Musculoskeletal: Negative.   Neurological: Negative.   Hematological: Negative.   Psychiatric/Behavioral: Negative.   All other systems reviewed and are negative.    Objective: Vital Signs: LMP  (LMP Unknown)   Physical Exam Vitals signs and nursing note reviewed.  Constitutional:      Appearance: She is well-developed.  HENT:     Head: Normocephalic and atraumatic.  Neck:     Musculoskeletal: Neck supple.  Pulmonary:     Effort: Pulmonary effort is normal.  Abdominal:     Palpations: Abdomen is soft.  Skin:    General: Skin is warm.     Capillary Refill: Capillary refill takes less than 2 seconds.  Neurological:     Mental Status: She is alert and oriented to person, place, and time.  Psychiatric:        Behavior: Behavior normal.        Thought Content: Thought content normal.        Judgment: Judgment normal.     Ortho Exam Dorsal prominence over 5th metacarpal shaft without significant tenderness.  No rotational deformity.  ROM of MCP joint is actually pretty good. Specialty Comments:  No specialty comments available.  Imaging: Xr Hand Complete Left  Result Date: 02/28/2019 Subacute midshaft 5th  metacarpal fracture in volar angulation with callus formation.    PMFS History: Patient Active Problem List   Diagnosis Date Noted  . Closed displaced fracture of shaft of fifth metacarpal bone of left hand 02/28/2019  . MDD (major depressive disorder), recurrent severe, without psychosis ([place]) 02/08/2019  . MDD (major depressive disorder), severe ([place]) 01/13/2019  . Borderline personality disorder ([place]) 11/16/2018  . Moderate cannabis use disorder ([place]) 11/16/2018  . Severe recurrent major depression without psychotic features ([place]) 11/15/2018  . 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 Chronic hypertension 04/04/2018  . Nephrotic syndrome 04/01/2018  . Supervision of high risk pregnancy, antepartum 04/01/2018  . Rh negative  status during pregnancy 04/01/2018  . PTSD (post-traumatic stress disorder) 04/01/2018  . Bipolar affective disorder, depressed, mild ([place]) 04/09/2017  . Cocaine abuse ([place]) 04/09/2017   Past Medical History:  Diagnosis Date  . Bipolar affective, manic ([place])   . Hypertension    diagnosed as child; stopped meds at 40 yo  . Hypoglycemia   . Insomnia   . Nephrotic syndrome   . Nephrotic syndrome   . PTSD (post-traumatic stress disorder)   . Renal disease     Family History  Problem Relation Age of Onset  . Diabetes Other   . Hypertension Other     Past Surgical History:  Procedure Laterality Date  . CHOLECYSTECTOMY    . extraction of wisdom teeth    . RENAL BIOPSY     Social History   Occupational History  . Not on file  Tobacco Use  . Smoking status: Current Every Day Smoker    Packs/day: 0.50    Types: Cigarettes  . Smokeless tobacco: Never Used  Substance and Sexual Activity  . Alcohol use: No  . Drug use: Yes    Types: Marijuana, Cocaine  . Sexual activity: Yes    Birth control/protection: Condom

## 2019-03-10 NOTE — Congregational Nurse Program (Signed)
  Dept: [facility] Nurse Program Note  Date of Encounter: 02/17/2019  Past Medical History: Past Medical History:  Diagnosis Date  . Bipolar affective, manic ([place])   . Hypertension    diagnosed as child; stopped meds at 23 yo  . Hypoglycemia   . Insomnia   . Nephrotic syndrome   . Nephrotic syndrome   . PTSD (post-traumatic stress disorder)   . Renal disease     Encounter Details:[place] check in. She states recent [facility] admission to [facility]. She states that she will contact [facility] for follow up. She will need ortho referral from Dr Joya Gaskins due to fracture left hand.  Lisette Abu RN BSN CNP [phone]

## 2019-03-30 ENCOUNTER — Other Ambulatory Visit: Payer: Self-pay

## 2019-03-30 ENCOUNTER — Encounter (HOSPITAL_COMMUNITY): Payer: Self-pay | Admitting: Emergency Medicine

## 2019-03-30 ENCOUNTER — Emergency Department (HOSPITAL_COMMUNITY)
Admission: EM | Admit: 2019-03-30 | Discharge: 2019-03-30 | Disposition: A | Discharge status: Home / Self Care | Payer: Medicaid Other | Attending: Emergency Medicine | Admitting: Emergency Medicine

## 2019-03-30 DIAGNOSIS — Z202 Contact with and (suspected) exposure to infections with a predominantly sexual mode of transmission: Secondary | ICD-10-CM | POA: Insufficient documentation

## 2019-03-30 DIAGNOSIS — Z79899 Other long term (current) drug therapy: Secondary | ICD-10-CM | POA: Diagnosis not present

## 2019-03-30 DIAGNOSIS — F17210 Nicotine dependence, cigarettes, uncomplicated: Secondary | ICD-10-CM | POA: Diagnosis not present

## 2019-03-30 DIAGNOSIS — I10 Essential (primary) hypertension: Secondary | ICD-10-CM | POA: Insufficient documentation

## 2019-03-30 DIAGNOSIS — N898 Other specified noninflammatory disorders of vagina: Secondary | ICD-10-CM | POA: Insufficient documentation

## 2019-03-30 DIAGNOSIS — R1030 Lower abdominal pain, unspecified: Secondary | ICD-10-CM | POA: Insufficient documentation

## 2019-03-30 LAB — URINALYSIS, ROUTINE W REFLEX MICROSCOPIC
Bilirubin Urine: NEGATIVE
Glucose, UA: NEGATIVE mg/dL
Hgb urine dipstick: NEGATIVE
Ketones, ur: NEGATIVE mg/dL
Leukocytes,Ua: NEGATIVE
Nitrite: NEGATIVE
Protein, ur: 100 mg/dL — AB
Specific Gravity, Urine: 1.008 (ref 1.005–1.030)
pH: 6 (ref 5.0–8.0)

## 2019-03-30 LAB — WET PREP, GENITAL
Clue Cells Wet Prep HPF POC: NONE SEEN
Sperm: NONE SEEN
Trich, Wet Prep: NONE SEEN
Yeast Wet Prep HPF POC: NONE SEEN

## 2019-03-30 LAB — POC URINE PREG, ED: Preg Test, Ur: NEGATIVE

## 2019-03-30 MED ORDER — LIDOCAINE HCL 1 % IJ SOLN
INTRAMUSCULAR | Status: AC
  Administered 2019-03-30: 20 mL
  Filled 2019-03-30: qty 20

## 2019-03-30 MED ORDER — AZITHROMYCIN 250 MG PO TABS
1000.0000 mg | ORAL_TABLET | Freq: Once | ORAL | Status: AC
  Administered 2019-03-30: 1000 mg via ORAL
  Filled 2019-03-30: qty 4

## 2019-03-30 MED ORDER — CEFTRIAXONE SODIUM 250 MG IJ SOLR
250.0000 mg | Freq: Once | INTRAMUSCULAR | Status: AC
  Administered 2019-03-30: 250 mg via INTRAMUSCULAR
  Filled 2019-03-30: qty 250

## 2019-03-30 NOTE — Discharge Instructions (Addendum)
It was my pleasure taking care of you today!   As we discussed, I will give you a call if your urine or swab shows that you need further treatment.   You have been tested for gonorrhea and chlamydia.  You will be notified in about 3 days if these results are positive.  Return to ER for new or worsening symptoms, any additional concerns.

## 2019-03-30 NOTE — ED Provider Notes (Signed)
[facility] DEPT Provider Note   CSN: [id number] Arrival date & time: 03/30/19  1516    History   Chief Complaint Chief Complaint  Patient presents with  . Vaginal Discharge  . Exposure to STD    HPI Kamron Perleberg is a 23 y.o. female.     The history is provided by the patient and medical records. No language interpreter was used.  Vaginal Discharge  Associated symptoms: abdominal pain (Suprapubic pain)   Associated symptoms: no dysuria, no nausea and no vomiting   Exposure to STD  Associated symptoms include abdominal pain (Suprapubic pain).   Giah Lisa is a 23 y.o. female  with a PMH as listed below who presents to the Emergency Department complaining of vaginal discharge which she noticed this morning.  Did have some suprapubic discomfort last night which she states went away when she woke up.  She is currently on Flagyl for bacterial vaginosis which was diagnosed last week.  She states that she has 1 more dose tonight.  She has been having unprotected intercourse while on antibiotics.  She states her discharge is thick, white like cottage cheese.    Past Medical History:  Diagnosis Date  . Bipolar affective, manic ([place])   . Hypertension    diagnosed as child; stopped meds at 81 yo  . Hypoglycemia   . Insomnia   . Nephrotic syndrome   . Nephrotic syndrome   . PTSD (post-traumatic stress disorder)   . Renal disease     Patient Active Problem List   Diagnosis Date Noted  . Closed displaced fracture of shaft of fifth metacarpal bone of left hand 02/28/2019  . MDD (major depressive disorder), recurrent severe, without psychosis ([place]) 02/08/2019  . MDD (major depressive disorder), severe ([place]) 01/13/2019  . Borderline personality disorder ([place]) 11/16/2018  . Moderate cannabis use disorder ([place]) 11/16/2018  . Severe recurrent major depression without psychotic features ([place]) 11/15/2018  . Renal disease in pregnancy, antepartum  04/07/2018  . Chronic hypertension 04/04/2018  . Nephrotic syndrome 04/01/2018  . Supervision of high risk pregnancy, antepartum 04/01/2018  . Rh negative status during pregnancy 04/01/2018  . PTSD (post-traumatic stress disorder) 04/01/2018  . Bipolar affective disorder, depressed, mild ([place]) 04/09/2017  . Cocaine abuse ([place]) 04/09/2017    Past Surgical History:  Procedure Laterality Date  . CHOLECYSTECTOMY    . extraction of wisdom teeth    . RENAL BIOPSY       OB History    Gravida  2   Para  1   Term  1   Preterm      AB      Living  1     SAB      TAB      Ectopic      Multiple      Live Births  1            Home Medications    Prior to Admission medications   Medication Sig Start Date End Date Taking? Authorizing Provider  acetaminophen (TYLENOL) 325 MG tablet Take 650 mg by mouth every 6 (six) hours as needed.   Yes [provider]  ARIPiprazole (ABILIFY MAINTENA IM) Inject 400 mg into the muscle every 30 (thirty) days.   Yes [provider]  DULoxetine (CYMBALTA) 20 MG capsule Take 20 mg by mouth daily.   Yes [provider]  gabapentin (NEURONTIN) 400 MG capsule Take 2 capsules (800 mg total) by mouth 3 (three) times  daily. 02/13/19  Yes Connye Burkitt, NP  hydrOXYzine (ATARAX/VISTARIL) 25 MG tablet Take 1 tablet (25 mg total) by mouth 3 (three) times daily as needed for anxiety. 02/13/19  Yes Connye Burkitt, NP  traZODone (DESYREL) 100 MG tablet Take 1 tablet (100 mg total) by mouth at bedtime as needed for sleep. 02/13/19  Yes Connye Burkitt, NP  citalopram (CELEXA) 20 MG tablet Take 1 tablet (20 mg total) by mouth daily. For mood Patient not taking: Reported on 03/30/2019 02/14/19   Connye Burkitt, NP  nicotine (NICODERM CQ - DOSED IN MG/24 HOURS) 21 mg/24hr patch Place 1 patch (21 mg total) onto the skin daily. For smoking cessation Patient not taking: Reported on 03/30/2019 02/14/19   Connye Burkitt, NP  propranolol  (INDERAL) 10 MG tablet Take 1 tablet (10 mg total) by mouth 2 (two) times daily. Patient not taking: Reported on 03/30/2019 02/13/19   Connye Burkitt, NP    Family History Family History  Problem Relation Age of Onset  . Diabetes Other   . Hypertension Other     Social History Social History   Tobacco Use  . Smoking status: Current Every Day Smoker    Packs/day: 0.50    Types: Cigarettes  . Smokeless tobacco: Never Used  Substance Use Topics  . Alcohol use: No  . Drug use: Yes    Types: Marijuana, Cocaine     Allergies   Prednisone; Prozac [fluoxetine hcl]; and Wellbutrin [bupropion]   Review of Systems Review of Systems  Gastrointestinal: Positive for abdominal pain (Suprapubic pain). Negative for diarrhea, nausea and vomiting.  Genitourinary: Positive for vaginal discharge. Negative for dysuria, flank pain, frequency, urgency, vaginal bleeding and vaginal pain.  All other systems reviewed and are negative.    Physical Exam Updated Vital Signs BP (!) 149/91 (BP Location: Right Arm)   Pulse 77   Temp 98.7 F (37.1 C) (Oral)   Resp 18   LMP 03/10/2019   SpO2 100%   Physical Exam Vitals signs and nursing note reviewed.  Constitutional:      General: She is not in acute distress.    Appearance: She is well-developed.  HENT:     Head: Normocephalic and atraumatic.  Neck:     Musculoskeletal: Neck supple.  Cardiovascular:     Rate and Rhythm: Normal rate and regular rhythm.     Heart sounds: Normal heart sounds. No murmur.  Pulmonary:     Effort: Pulmonary effort is normal. No respiratory distress.     Breath sounds: Normal breath sounds.  Abdominal:     General: There is no distension.     Palpations: Abdomen is soft.     Tenderness: There is no abdominal tenderness.     Comments: No abdominal, flank or CVA tenderness.  Genitourinary:    Comments: Chaperone present for exam. + discharge. No CMT. No adnexal masses, tenderness, or fullness.  No bleeding  within vaginal vault. Skin:    General: Skin is warm and dry.  Neurological:     Mental Status: She is alert and oriented to person, place, and time.      ED Treatments / Results  Labs (all labs ordered are listed, but only abnormal results are displayed) Labs Reviewed  WET PREP, GENITAL - Abnormal; Notable for the following components:      Result Value   WBC, Wet Prep HPF POC RARE (*)    All other components within normal limits  URINALYSIS, ROUTINE W  REFLEX MICROSCOPIC - Abnormal; Notable for the following components:   Color, Urine STRAW (*)    Protein, ur 100 (*)    Bacteria, UA RARE (*)    All other components within normal limits  POC URINE PREG, ED  GC/CHLAMYDIA PROBE AMP ([place]) NOT AT [facility]    EKG None  Radiology No results found.  Procedures Procedures (including critical care time)  Medications Ordered in ED Medications  cefTRIAXone (ROCEPHIN) injection 250 mg (250 mg Intramuscular Given 03/30/19 1727)  azithromycin (ZITHROMAX) tablet 1,000 mg (1,000 mg Oral Given 03/30/19 1725)  lidocaine (XYLOCAINE) 1 % (with pres) injection (20 mLs  Given 03/30/19 1729)     Initial Impression / Assessment and Plan / ED Course  I have reviewed the triage vital signs and the nursing notes.  Pertinent labs & imaging results that were available during my care of the patient were reviewed by me and considered in my medical decision making (see chart for details).       Rayvon Newstrom is a 23 y.o. female who presents to ED for vaginal discharge after being notified by partner that they tested positive for STD.  On exam, patient is afebrile, hemodynamically stable with no cervical motion or adnexal tenderness.  Minimal suprapubic tenderness.  No CVA tenderness.  Treated prophylactically for gonorrhea and chlamydia.  She is aware that she was tested for this and will be notified if results are positive.  Recommended abstinence from intercourse for 2 weeks and until  asymptomatic.  Patient requested discharge from emergency department prior to wet prep and urinalysis results.  I have let her know that I would give her a phone call if these showed any thing that would need further treatment.  Wet prep with rare WBCs, otherwise normal.  Urinalysis not consistent with infection.  No further treatment recommended by me at this time.  She was discharged to home in satisfactory condition and encouraged to follow-up with PCP or health department as needed.  Final Clinical Impressions(s) / ED Diagnoses   Final diagnoses:  STD exposure  Vaginal discharge    ED Discharge Orders    None       Landen Breeland, Ozella Almond, PA-C 03/30/19 [place], [place], DO 03/30/19 2213

## 2019-03-30 NOTE — ED Triage Notes (Signed)
Pt reports that her sexual partner told her that she needs to be tested/treated for STD bc he has slept with someone else and they were positive.  She having white "cottage cheese discharge and when you don't wash really good down there and when you wipe there is dirt like coming from down there when I wipe".

## 2019-03-31 LAB — GC/CHLAMYDIA PROBE AMP (~~LOC~~) NOT AT ARMC
Chlamydia: NEGATIVE
Neisseria Gonorrhea: NEGATIVE

## 2019-05-19 ENCOUNTER — Emergency Department (HOSPITAL_COMMUNITY)
Admission: EM | Admit: 2019-05-19 | Discharge: 2019-05-19 | Disposition: A | Discharge status: Home / Self Care | Payer: Medicaid Other | Attending: Emergency Medicine | Admitting: Emergency Medicine

## 2019-05-19 ENCOUNTER — Other Ambulatory Visit: Payer: Self-pay

## 2019-05-19 ENCOUNTER — Encounter (HOSPITAL_COMMUNITY): Payer: Self-pay | Admitting: Emergency Medicine

## 2019-05-19 DIAGNOSIS — Z20828 Contact with and (suspected) exposure to other viral communicable diseases: Secondary | ICD-10-CM | POA: Insufficient documentation

## 2019-05-19 DIAGNOSIS — R55 Syncope and collapse: Secondary | ICD-10-CM | POA: Diagnosis not present

## 2019-05-19 DIAGNOSIS — N939 Abnormal uterine and vaginal bleeding, unspecified: Secondary | ICD-10-CM

## 2019-05-19 DIAGNOSIS — F17210 Nicotine dependence, cigarettes, uncomplicated: Secondary | ICD-10-CM | POA: Insufficient documentation

## 2019-05-19 DIAGNOSIS — I10 Essential (primary) hypertension: Secondary | ICD-10-CM | POA: Insufficient documentation

## 2019-05-19 DIAGNOSIS — Z79899 Other long term (current) drug therapy: Secondary | ICD-10-CM | POA: Diagnosis not present

## 2019-05-19 LAB — WET PREP, GENITAL
Sperm: NONE SEEN
Trich, Wet Prep: NONE SEEN
Yeast Wet Prep HPF POC: NONE SEEN

## 2019-05-19 LAB — COMPREHENSIVE METABOLIC PANEL
ALT: 25 U/L (ref 0–44)
AST: 22 U/L (ref 15–41)
Albumin: 3.4 g/dL — ABNORMAL LOW (ref 3.5–5.0)
Alkaline Phosphatase: 49 U/L (ref 38–126)
Anion gap: 7 (ref 5–15)
BUN: 16 mg/dL (ref 6–20)
CO2: 29 mmol/L (ref 22–32)
Calcium: 8.9 mg/dL (ref 8.9–10.3)
Chloride: 102 mmol/L (ref 98–111)
Creatinine, Ser: 0.96 mg/dL (ref 0.44–1.00)
GFR calc Af Amer: >60 mL/min (ref >60)
GFR calc non Af Amer: >60 mL/min (ref >60)
Glucose, Bld: 97 mg/dL (ref 70–99)
Potassium: 4.3 mmol/L (ref 3.5–5.1)
Sodium: 138 mmol/L (ref 135–145)
Total Bilirubin: 0.5 mg/dL (ref 0.3–1.2)
Total Protein: 7.2 g/dL (ref 6.5–8.1)

## 2019-05-19 LAB — CBC WITH DIFFERENTIAL/PLATELET
Abs Immature Granulocytes: 0.03 10*3/uL (ref 0.00–0.07)
Basophils Absolute: 0 10*3/uL (ref 0.0–0.1)
Basophils Relative: 0 %
Eosinophils Absolute: 0.1 10*3/uL (ref 0.0–0.5)
Eosinophils Relative: 1 %
HCT: 46 % (ref 36.0–46.0)
Hemoglobin: 15.2 g/dL — ABNORMAL HIGH (ref 12.0–15.0)
Immature Granulocytes: 0 %
Lymphocytes Relative: 20 %
Lymphs Abs: 2.1 10*3/uL (ref 0.7–4.0)
MCH: 30.7 pg (ref 26.0–34.0)
MCHC: 33 g/dL (ref 30.0–36.0)
MCV: 92.9 fL (ref 80.0–100.0)
Monocytes Absolute: 0.7 10*3/uL (ref 0.1–1.0)
Monocytes Relative: 6 %
Neutro Abs: 7.6 10*3/uL (ref 1.7–7.7)
Neutrophils Relative %: 73 %
Platelets: 243 10*3/uL (ref 150–400)
RBC: 4.95 MIL/uL (ref 3.87–5.11)
RDW: 13 % (ref 11.5–15.5)
WBC: 10.6 10*3/uL — ABNORMAL HIGH (ref 4.0–10.5)
nRBC: 0 % (ref 0.0–0.2)

## 2019-05-19 LAB — I-STAT BETA HCG BLOOD, ED (MC, WL, AP ONLY): I-stat hCG, quantitative: <5 m[IU]/mL (ref <5)

## 2019-05-19 LAB — CBG MONITORING, ED: Glucose-Capillary: 69 mg/dL — ABNORMAL LOW (ref 70–99)

## 2019-05-19 LAB — LIPASE, BLOOD: Lipase: 55 U/L — ABNORMAL HIGH (ref 11–51)

## 2019-05-19 NOTE — ED Provider Notes (Signed)
[facility] DEPT Provider Note   CSN: [id number] Arrival date & time: 05/19/19  1101    History   Chief Complaint Chief Complaint  Patient presents with  . abd pain  . Vaginal Bleeding  . wants Covid test    HPI Natasha Long is a 23 y.o. female.  Presents emergency department chief complaint of vaginal bleeding and presyncope.  Patient states that her last menstrual period was on 04/30/2019.  Several days ago she began having vaginal bleeding and spotting.  She was also having pelvic cramping.  She is unsure if she is pregnant.  Yesterday at work she felt weak and dizzy.  She also felt presyncopal.  She had some nausea but did not vomit.  Her boss felt like she needed to come in and get a coronavirus test.  She did not lose consciousness.  She denies chest pain, shortness of breath, racing heart.  She has heavy periods regularly.  He denies any current or active vaginal bleeding or pelvic pain.Marland Kitchen     HPI  Past Medical History:  Diagnosis Date  . Bipolar affective, manic ([place])   . Hypertension    diagnosed as child; stopped meds at 89 yo  . Hypoglycemia   . Insomnia   . Nephrotic syndrome   . Nephrotic syndrome   . PTSD (post-traumatic stress disorder)   . Renal disease     Patient Active Problem List   Diagnosis Date Noted  . Closed displaced fracture of shaft of fifth metacarpal bone of left hand 02/28/2019  . MDD (major depressive disorder), recurrent severe, without psychosis ([place]) 02/08/2019  . MDD (major depressive disorder), severe ([place]) 01/13/2019  . Borderline personality disorder ([place]) 11/16/2018  . Moderate cannabis use disorder ([place]) 11/16/2018  . Severe recurrent major depression without psychotic features ([place]) 11/15/2018  . 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 Chronic hypertension 04/04/2018  . Nephrotic syndrome 04/01/2018  . Supervision of high risk pregnancy, antepartum 04/01/2018  . Rh negative status  during pregnancy 04/01/2018  . PTSD (post-traumatic stress disorder) 04/01/2018  . Bipolar affective disorder, depressed, mild ([place]) 04/09/2017  . Cocaine abuse ([place]) 04/09/2017    Past Surgical History:  Procedure Laterality Date  . CHOLECYSTECTOMY    . extraction of wisdom teeth    . RENAL BIOPSY       OB History    Gravida  2   Para  1   Term  1   Preterm      AB      Living  1     SAB      TAB      Ectopic      Multiple      Live Births  1            Home Medications    Prior to Admission medications   Medication Sig Start Date End Date Taking? Authorizing Provider  ARIPiprazole (ABILIFY MAINTENA IM) Inject 400 mg into the muscle every 30 (thirty) days.   Yes [provider]  DULoxetine (CYMBALTA) 20 MG capsule Take 20 mg by mouth daily.   Yes [provider]  gabapentin (NEURONTIN) 400 MG capsule Take 2 capsules (800 mg total) by mouth 3 (three) times daily. 02/13/19  Yes Connye Burkitt, NP  hydrOXYzine (ATARAX/VISTARIL) 25 MG tablet Take 1 tablet (25 mg total) by mouth 3 (three) times daily as needed for anxiety. 02/13/19  Yes Connye Burkitt, NP  citalopram (CELEXA) 20 MG  tablet Take 1 tablet (20 mg total) by mouth daily. For mood Patient not taking: Reported on 03/30/2019 02/14/19   Connye Burkitt, NP  nicotine (NICODERM CQ - DOSED IN MG/24 HOURS) 21 mg/24hr patch Place 1 patch (21 mg total) onto the skin daily. For smoking cessation Patient not taking: Reported on 03/30/2019 02/14/19   Connye Burkitt, NP  propranolol (INDERAL) 10 MG tablet Take 1 tablet (10 mg total) by mouth 2 (two) times daily. Patient not taking: Reported on 03/30/2019 02/13/19   Connye Burkitt, NP  traZODone (DESYREL) 100 MG tablet Take 1 tablet (100 mg total) by mouth at bedtime as needed for sleep. Patient not taking: Reported on 05/19/2019 02/13/19   Connye Burkitt, NP    Family History Family History  Problem Relation Age of Onset  . Diabetes Other   .  Hypertension Other     Social History Social History   Tobacco Use  . Smoking status: Current Every Day Smoker    Packs/day: 0.50    Types: Cigarettes  . Smokeless tobacco: Never Used  Substance Use Topics  . Alcohol use: No  . Drug use: Yes    Types: Marijuana, Cocaine     Allergies   Prednisone, Prozac [fluoxetine hcl], and Wellbutrin [bupropion]   Review of Systems Review of Systems Ten systems reviewed and are negative for acute change, except as noted in the HPI.    Physical Exam Updated Vital Signs BP (!) 128/40   Pulse (!) 57   Temp 98.2 F (36.8 C) (Oral)   Resp 19   LMP 04/30/2019   SpO2 100%   Physical Exam Vitals signs and nursing note reviewed.  Constitutional:      General: She is not in acute distress.    Appearance: She is well-developed. She is not diaphoretic.  HENT:     Head: Normocephalic and atraumatic.  Eyes:     General: No scleral icterus.    Conjunctiva/sclera: Conjunctivae normal.  Neck:     Musculoskeletal: Normal range of motion.  Cardiovascular:     Rate and Rhythm: Normal rate and regular rhythm.     Heart sounds: Normal heart sounds. No murmur. No friction rub. No gallop.   Pulmonary:     Effort: Pulmonary effort is normal. No respiratory distress.     Breath sounds: Normal breath sounds.  Abdominal:     General: Bowel sounds are normal. There is no distension.     Palpations: Abdomen is soft. There is no mass.     Tenderness: There is no abdominal tenderness. There is no guarding.  Skin:    General: Skin is warm and dry.  Neurological:     General: No focal deficit present.     Mental Status: She is alert and oriented to person, place, and time.  Psychiatric:        Behavior: Behavior normal.      ED Treatments / Results  Labs (all labs ordered are listed, but only abnormal results are displayed) Labs Reviewed  WET PREP, GENITAL - Abnormal; Notable for the following components:      Result Value   Clue Cells  Wet Prep HPF POC PRESENT (*)    WBC, Wet Prep HPF POC MANY (*)    All other components within normal limits  CBC WITH DIFFERENTIAL/PLATELET - Abnormal; Notable for the following components:   WBC 10.6 (*)    Hemoglobin 15.2 (*)    All other components within normal limits  COMPREHENSIVE METABOLIC PANEL - Abnormal; Notable for the following components:   Albumin 3.4 (*)    All other components within normal limits  LIPASE, BLOOD - Abnormal; Notable for the following components:   Lipase 55 (*)    All other components within normal limits  CBG MONITORING, ED - Abnormal; Notable for the following components:   Glucose-Capillary 69 (*)    All other components within normal limits  NOVEL CORONAVIRUS, NAA (HOSPITAL ORDER, SEND-OUT TO REF LAB)  RPR  HIV ANTIBODY (ROUTINE TESTING W REFLEX)  I-STAT BETA HCG BLOOD, ED ([facility], [facility], [facility] ONLY)  GC/CHLAMYDIA PROBE AMP ([place]) NOT AT [facility]    EKG EKG Interpretation  Date/Time:  Friday May 19 2019 12:13:05 EDT Ventricular Rate:  59 PR Interval:    QRS Duration: 86 QT Interval:  401 QTC Calculation: 398 R Axis:   62 Text Interpretation:  Sinus rhythm ST elev, probable normal early repol pattern When compared with ECG of 11/14/2018 No significant change was found Confirmed by Francine Graven [phone]) on 05/19/2019 12:50:17 PM   Radiology No results found.  Procedures Procedures (including critical care time)  Medications Ordered in ED Medications - No data to display   Initial Impression / Assessment and Plan / ED Course  I have reviewed the triage vital signs and the nursing notes.  Pertinent labs & imaging results that were available during my care of the patient were reviewed by me and considered in my medical decision making (see chart for details).       23 year old female with feeling of presyncope, abnormal uterine bleeding, I reviewed her labs which shows positive clue cells however patient is asymptomatic for abnormal  vaginal discharge.  Blood glucose was slightly low and patient requested to eat.  Lipase mildly elevated however she does not complain of any abdominal tenderness has benign exam.  Her CMP is fairly unremarkable with slightly low albumin level.  She does have a history of nephrotic syndrome in the past although there is no evidence of elevated creatinine or BUN.  Her GFR also appears within normal limits.  Patient's white cell count slightly elevated and elevated hemoglobin level.  Patient's EKG shows normal sinus rhythm without arrhythmia. Patient has benign pelvic examination.  She is not pregnant.  I have low suspicion for ectopic pregnancy as the cause I have sent her coronavirus test through outpatient testing.  She has been given home isolation precautions.  Patient may follow-up in the outpatient setting with OB/GYN for abnormal uterine bleeding.  She appears appropriate for discharge at this time   Final Clinical Impressions(s) / ED Diagnoses   Final diagnoses:  Pre-syncope  Abnormal uterine bleeding (AUB)    ED Discharge Orders    None       Margarita Mail, PA-C 05/19/19 [place], [place], DO 05/22/19 1454

## 2019-05-19 NOTE — ED Notes (Signed)
PT RFUSED THE CBG & EKG

## 2019-05-19 NOTE — ED Notes (Addendum)
Pt d/c home per MD order. Discharge summary reviewed with pt, pt verbalizes understanding. Ambulatory , no s/s of acute distress noted.

## 2019-05-19 NOTE — Discharge Instructions (Addendum)
Your pregnancy test was negative. Your labs and ekg are also normal.  Your COVID 19 test should return in 48 hours.  YOU ARE UNDER QUARANTINE AND YOU MAY NOT LEAVE YOUR HOME UNTIL YOU HAVE YOUR TEST RESULTS WHICH SHOULD BE IN 48 HOURS.    Person Under Monitoring Name: Natasha Long  Location: [place] [address]   Infection Prevention Recommendations for Individuals Confirmed to have, or Being Evaluated for, 2019 Novel Coronavirus (COVID-19) Infection Who Receive Care at Home  Individuals who are confirmed to have, or are being evaluated for, COVID-19 should follow the prevention steps below until a healthcare provider or local or state health department says they can return to normal activities.  Stay home except to get medical care You should restrict activities outside your home, except for getting medical care. Do not go to work, school, or public areas, and do not use public transportation or taxis.  Call ahead before visiting your doctor Before your medical appointment, call the healthcare provider and tell them that you have, or are being evaluated for, COVID-19 infection. This will help the healthcare providers office take steps to keep other people from getting infected. Ask your healthcare provider to call the local or state health department.  Monitor your symptoms Seek prompt medical attention if your illness is worsening (e.g., difficulty breathing). Before going to your medical appointment, call the healthcare provider and tell them that you have, or are being evaluated for, COVID-19 infection. Ask your healthcare provider to call the local or state health department.  Wear a facemask You should wear a facemask that covers your nose and mouth when you are in the same room with other people and when you visit a healthcare provider. People who live with or visit you should also wear a facemask while they are in the same room with  you.  Separate yourself from other people in your home As much as possible, you should stay in a different room from other people in your home. Also, you should use a separate bathroom, if available.  Avoid sharing household items You should not share dishes, drinking glasses, cups, eating utensils, towels, bedding, or other items with other people in your home. After using these items, you should wash them thoroughly with soap and water.  Cover your coughs and sneezes Cover your mouth and nose with a tissue when you cough or sneeze, or you can cough or sneeze into your sleeve. Throw used tissues in a lined trash can, and immediately wash your hands with soap and water for at least 20 seconds or use an alcohol-based hand rub.  Wash your [facility] your hands often and thoroughly with soap and water for at least 20 seconds. You can use an alcohol-based hand sanitizer if soap and water are not available and if your hands are not visibly dirty. Avoid touching your eyes, nose, and mouth with unwashed hands.   Prevention Steps for Caregivers and Household Members of Individuals Confirmed to have, or Being Evaluated for, COVID-19 Infection Being Cared for in the Home  If you live with, or provide care at home for, a person confirmed to have, or being evaluated for, COVID-19 infection please follow these guidelines to prevent infection:  Follow healthcare providers instructions Make sure that you understand and can help the patient follow any healthcare provider instructions for all care.  Provide for the patients basic needs You should help the patient with basic needs in the home and  provide support for getting groceries, prescriptions, and other personal needs.  Monitor the patients symptoms If they are getting sicker, call his or her medical provider and tell them that the patient has, or is being evaluated for, COVID-19 infection. This will help the healthcare providers office  take steps to keep other people from getting infected. Ask the healthcare provider to call the local or state health department.  Limit the number of people who have contact with the patient If possible, have only one caregiver for the patient. Other household members should stay in another home or place of residence. If this is not possible, they should stay in another room, or be separated from the patient as much as possible. Use a separate bathroom, if available. Restrict visitors who do not have an essential need to be in the home.  Keep older adults, very young children, and other sick people away from the patient Keep older adults, very young children, and those who have compromised immune systems or chronic health conditions away from the patient. This includes people with chronic heart, lung, or kidney conditions, diabetes, and cancer.  Ensure good ventilation Make sure that shared spaces in the home have good air flow, such as from an air conditioner or an opened window, weather permitting.  Wash your hands often Wash your hands often and thoroughly with soap and water for at least 20 seconds. You can use an alcohol based hand sanitizer if soap and water are not available and if your hands are not visibly dirty. Avoid touching your eyes, nose, and mouth with unwashed hands. Use disposable paper towels to dry your hands. If not available, use dedicated cloth towels and replace them when they become wet.  Wear a facemask and gloves Wear a disposable facemask at all times in the room and gloves when you touch or have contact with the patients blood, body fluids, and/or secretions or excretions, such as sweat, saliva, sputum, nasal mucus, vomit, urine, or feces.  Ensure the mask fits over your nose and mouth tightly, and do not touch it during use. Throw out disposable facemasks and gloves after using them. Do not reuse. Wash your hands immediately after removing your facemask and  gloves. If your personal clothing becomes contaminated, carefully remove clothing and launder. Wash your hands after handling contaminated clothing. Place all used disposable facemasks, gloves, and other waste in a lined container before disposing them with other household waste. Remove gloves and wash your hands immediately after handling these items.  Do not share dishes, glasses, or other household items with the patient Avoid sharing household items. You should not share dishes, drinking glasses, cups, eating utensils, towels, bedding, or other items with a patient who is confirmed to have, or being evaluated for, COVID-19 infection. After the person uses these items, you should wash them thoroughly with soap and water.  Wash laundry thoroughly Immediately remove and wash clothes or bedding that have blood, body fluids, and/or secretions or excretions, such as sweat, saliva, sputum, nasal mucus, vomit, urine, or feces, on them. Wear gloves when handling laundry from the patient. Read and follow directions on labels of laundry or clothing items and detergent. In general, wash and dry with the warmest temperatures recommended on the label.  Clean all areas the individual has used often Clean all touchable surfaces, such as counters, tabletops, doorknobs, bathroom fixtures, toilets, phones, keyboards, tablets, and bedside tables, every day. Also, clean any surfaces that may have blood, body fluids, and/or secretions or  excretions on them. Wear gloves when cleaning surfaces the patient has come in contact with. Use a diluted bleach solution (e.g., dilute bleach with 1 part bleach and 10 parts water) or a household disinfectant with a label that says EPA-registered for coronaviruses. To make a bleach solution at home, add 1 tablespoon of bleach to 1 quart (4 cups) of water. For a larger supply, add  cup of bleach to 1 gallon (16 cups) of water. Read labels of cleaning products and follow  recommendations provided on product labels. Labels contain instructions for safe and effective use of the cleaning product including precautions you should take when applying the product, such as wearing gloves or eye protection and making sure you have good ventilation during use of the product. Remove gloves and wash hands immediately after cleaning.  Monitor yourself for signs and symptoms of illness Caregivers and household members are considered close contacts, should monitor their health, and will be asked to limit movement outside of the home to the extent possible. Follow the monitoring steps for close contacts listed on the symptom monitoring form.   ? If you have additional questions, contact your local health department or call the epidemiologist on call at [phone] (available 24/7). ? This guidance is subject to change. For the most up-to-date guidance from [facility], please refer to their website: [email or web address]

## 2019-05-19 NOTE — ED Notes (Signed)
Pt provided [place] sandwich and juice.

## 2019-05-19 NOTE — ED Triage Notes (Signed)
Pt c/o abd cramping with vaginal bleeding for couple days. Reports yesterday at work got dizzy and about passed out. Her boss wants her tested for Covid.

## 2019-05-20 LAB — NOVEL CORONAVIRUS, NAA (HOSP ORDER, SEND-OUT TO REF LAB; TAT 18-24 HRS): SARS-CoV-2, NAA: NOT DETECTED

## 2019-05-20 LAB — HIV ANTIBODY (ROUTINE TESTING W REFLEX): HIV Screen 4th Generation wRfx: NONREACTIVE

## 2019-05-20 LAB — RPR: RPR Ser Ql: NONREACTIVE

## 2019-05-22 LAB — GC/CHLAMYDIA PROBE AMP (~~LOC~~) NOT AT ARMC
Chlamydia: NEGATIVE
Neisseria Gonorrhea: NEGATIVE

## 2019-06-07 ENCOUNTER — Emergency Department (HOSPITAL_COMMUNITY)
Admission: EM | Admit: 2019-06-07 | Discharge: 2019-06-08 | Disposition: A | Discharge status: Home / Self Care | Payer: Medicaid Other | Attending: Emergency Medicine | Admitting: Emergency Medicine

## 2019-06-07 ENCOUNTER — Encounter (HOSPITAL_COMMUNITY): Payer: Self-pay | Admitting: *Deleted

## 2019-06-07 ENCOUNTER — Other Ambulatory Visit: Payer: Self-pay

## 2019-06-07 DIAGNOSIS — R531 Weakness: Secondary | ICD-10-CM | POA: Insufficient documentation

## 2019-06-07 DIAGNOSIS — R197 Diarrhea, unspecified: Secondary | ICD-10-CM | POA: Insufficient documentation

## 2019-06-07 DIAGNOSIS — Z20828 Contact with and (suspected) exposure to other viral communicable diseases: Secondary | ICD-10-CM | POA: Diagnosis not present

## 2019-06-07 DIAGNOSIS — F17210 Nicotine dependence, cigarettes, uncomplicated: Secondary | ICD-10-CM | POA: Insufficient documentation

## 2019-06-07 DIAGNOSIS — I129 Hypertensive chronic kidney disease with stage 1 through stage 4 chronic kidney disease, or unspecified chronic kidney disease: Secondary | ICD-10-CM | POA: Insufficient documentation

## 2019-06-07 DIAGNOSIS — Z79899 Other long term (current) drug therapy: Secondary | ICD-10-CM | POA: Diagnosis not present

## 2019-06-07 DIAGNOSIS — N189 Chronic kidney disease, unspecified: Secondary | ICD-10-CM | POA: Insufficient documentation

## 2019-06-07 DIAGNOSIS — R112 Nausea with vomiting, unspecified: Secondary | ICD-10-CM | POA: Diagnosis present

## 2019-06-07 DIAGNOSIS — R1110 Vomiting, unspecified: Secondary | ICD-10-CM

## 2019-06-07 LAB — CBG MONITORING, ED: Glucose-Capillary: 96 mg/dL (ref 70–99)

## 2019-06-07 MED ORDER — ONDANSETRON 8 MG PO TBDP
8.0000 mg | ORAL_TABLET | Freq: Once | ORAL | Status: AC
  Administered 2019-06-07: 23:00:00 8 mg via ORAL
  Filled 2019-06-07: qty 1

## 2019-06-07 MED ORDER — ONDANSETRON 4 MG PO TBDP: 4.0000 mg | ORAL_TABLET | Freq: Three times a day (TID) | ORAL | 0 refills | Status: DC | PRN

## 2019-06-07 NOTE — ED Notes (Signed)
Pt in lobby eating a subway sandwhich

## 2019-06-07 NOTE — ED Triage Notes (Signed)
Pt complains of fatigue and emesis. Pt also states she had a yeast infection at her last visit. [name] employer would like pt to be tested for COVID 19.

## 2019-06-07 NOTE — ED Provider Notes (Signed)
[facility] DEPT Provider Note   CSN: [id number] Arrival date & time: 06/07/19  1708    History   Chief Complaint Chief Complaint  Patient presents with  . Weakness  . Emesis    HPI Natasha Long is a 23 y.o. female.     23 year old female with a history of hypertension, bipolar affective disorder, PTSD presents to the ED for complaints of nausea, vomiting, diarrhea.  She states that her symptoms began yesterday.  She has had approximately 4 episodes of nonbloody emesis today.  No melena or hematochezia.  Notes in triage that she has been feeling generally fatigued as well.  Was noted to be eating a full foot long [employer] sandwich while in the lobby.  She was not noted to have any subsequent emesis after eating, but alleges that she went to the bathroom and did vomit.  She is currently drinking ginger ale without difficulty.  States that her boss is requiring a coronavirus test for her to return to work.  She denies any known sick contacts, but works in a homeless shelter.  The history is provided by the patient. No language interpreter was used.    Past Medical History:  Diagnosis Date  . Bipolar affective, manic ([place])   . Hypertension    diagnosed as child; stopped meds at 61 yo  . Hypoglycemia   . Insomnia   . Nephrotic syndrome   . Nephrotic syndrome   . PTSD (post-traumatic stress disorder)   . Renal disease     Patient Active Problem List   Diagnosis Date Noted  . Closed displaced fracture of shaft of fifth metacarpal bone of left hand 02/28/2019  . 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 MDD (major depressive disorder), severe ([place]) 01/13/2019  . Borderline personality disorder ([place]) 11/16/2018  . Moderate cannabis use disorder ([place]) 11/16/2018  . Severe recurrent major depression without psychotic features ([place]) 11/15/2018  . 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  Chronic hypertension 04/04/2018  . Nephrotic syndrome 04/01/2018  . Supervision of high risk pregnancy, antepartum 04/01/2018  . Rh negative status during pregnancy 04/01/2018  . PTSD (post-traumatic stress disorder) 04/01/2018  . Bipolar affective disorder, depressed, mild ([place]) 04/09/2017  . Cocaine abuse ([place]) 04/09/2017    Past Surgical History:  Procedure Laterality Date  . CHOLECYSTECTOMY    . extraction of wisdom teeth    . RENAL BIOPSY       OB History    Gravida  2   Para  1   Term  1   Preterm      AB      Living  1     SAB      TAB      Ectopic      Multiple      Live Births  1            Home Medications    Prior to Admission medications   Medication Sig Start Date End Date Taking? Authorizing Provider  ARIPiprazole (ABILIFY MAINTENA IM) Inject 400 mg into the muscle every 30 (thirty) days.   Yes [provider]  DULoxetine (CYMBALTA) 20 MG capsule Take 20 mg by mouth daily.   Yes [provider]  gabapentin (NEURONTIN) 800 MG tablet Take 800 mg by mouth 3 (three) times daily. 05/29/19  Yes [provider]  hydrOXYzine (VISTARIL) 50 MG capsule Take 50 mg by mouth 3 (three) times daily as  needed for anxiety.  05/29/19  Yes [provider]  citalopram (CELEXA) 20 MG tablet Take 1 tablet (20 mg total) by mouth daily. For mood Patient not taking: Reported on 03/30/2019 02/14/19   Connye Burkitt, NP  gabapentin (NEURONTIN) 400 MG capsule Take 2 capsules (800 mg total) by mouth 3 (three) times daily. Patient not taking: Reported on 06/07/2019 02/13/19   Connye Burkitt, NP  hydrOXYzine (ATARAX/VISTARIL) 25 MG tablet Take 1 tablet (25 mg total) by mouth 3 (three) times daily as needed for anxiety. Patient not taking: Reported on 06/07/2019 02/13/19   Connye Burkitt, NP  nicotine (NICODERM CQ - DOSED IN MG/24 HOURS) 21 mg/24hr patch Place 1 patch (21 mg total) onto the skin daily. For smoking cessation Patient not taking:  Reported on 03/30/2019 02/14/19   Connye Burkitt, NP  ondansetron (ZOFRAN ODT) 4 MG disintegrating tablet Take 1 tablet (4 mg total) by mouth every 8 (eight) hours as needed for nausea or vomiting. 06/07/19   Antonietta Breach, PA-C  propranolol (INDERAL) 10 MG tablet Take 1 tablet (10 mg total) by mouth 2 (two) times daily. Patient not taking: Reported on 03/30/2019 02/13/19   Connye Burkitt, NP  traZODone (DESYREL) 100 MG tablet Take 1 tablet (100 mg total) by mouth at bedtime as needed for sleep. Patient not taking: Reported on 05/19/2019 02/13/19   Connye Burkitt, NP    Family History Family History  Problem Relation Age of Onset  . Diabetes Other   . Hypertension Other     Social History Social History   Tobacco Use  . Smoking status: Current Every Day Smoker    Packs/day: 0.50    Types: Cigarettes  . Smokeless tobacco: Never Used  Substance Use Topics  . Alcohol use: No  . Drug use: Yes    Types: Marijuana, Cocaine     Allergies   Prednisone, Prozac [fluoxetine hcl], and Wellbutrin [bupropion]   Review of Systems Review of Systems Ten systems reviewed and are negative for acute change, except as noted in the HPI.    Physical Exam Updated Vital Signs BP (!) 141/84   Pulse 75   Temp 98.6 F (37 C) (Oral)   Resp 18   LMP 05/28/2019   SpO2 100%   Physical Exam Vitals signs and nursing note reviewed.  Constitutional:      General: She is not in acute distress.    Appearance: She is well-developed. She is not diaphoretic.     Comments: Obese female and in NAD  HENT:     Head: Normocephalic and atraumatic.  Eyes:     General: No scleral icterus.    Conjunctiva/sclera: Conjunctivae normal.  Neck:     Musculoskeletal: Normal range of motion.  Cardiovascular:     Rate and Rhythm: Normal rate and regular rhythm.     Pulses: Normal pulses.  Pulmonary:     Effort: Pulmonary effort is normal. No respiratory distress.     Comments: Respirations even and unlabored  Abdominal:     Palpations: There is no mass.     Tenderness: There is no guarding.     Comments: Abdomen soft, morbidly obese.  No focal tenderness.  Musculoskeletal: Normal range of motion.  Skin:    General: Skin is warm and dry.     Coloration: Skin is not pale.     Findings: No erythema or rash.  Neurological:     General: No focal deficit present.  Mental Status: She is alert and oriented to person, place, and time.     Coordination: Coordination normal.     Comments: GCS 15.  Moving all extremities spontaneously.  Psychiatric:        Behavior: Behavior normal.      ED Treatments / Results  Labs (all labs ordered are listed, but only abnormal results are displayed) Labs Reviewed  NOVEL CORONAVIRUS, NAA (HOSPITAL ORDER, SEND-OUT TO REF LAB)  CBG MONITORING, ED    EKG None  Radiology No results found.  Procedures Procedures (including critical care time)  Medications Ordered in ED Medications  ondansetron (ZOFRAN-ODT) disintegrating tablet 8 mg (8 mg Oral Given 06/07/19 2327)     11:00 PM Orthostatic Lying - BP- Lying: 135/62 Pulse- Lying: 66 Orthostatic Sitting - BP- Sitting: 133/73 Pulse- Sitting: 68 Orthostatic Standing at 0 minutes - BP- Standing at 0 minutes: 140/82 Pulse-Standing: 71  11:23 PM Nontender abdomen. Tolerating ginger ale. Will check CBG and give Zofran. If CBG reassuring, plan for d/c with antiemetics.   Initial Impression / Assessment and Plan / ED Course  I have reviewed the triage vital signs and the nursing notes.  Pertinent labs & imaging results that were available during my care of the patient were reviewed by me and considered in my medical decision making (see chart for details).       Patient with symptoms consistent with likely viral gastroenteritis.  Vitals are stable, no fever.  No clinical signs of dehydration; moist mm, turgor normal.  Orthostatics stable.  Tolerating PO fluids and sandwich in the ED.  Abdomen soft  without masses or peritoneal signs.  Doubt emergent etiology of symptoms.  Will continue with outpatient management with Zofran.  Does have COVID test pending; advised to quarantine until results obtained.  Return precautions discussed and provided. Patient discharged in stable condition with no unaddressed concerns.  Natasha Long was evaluated in Emergency Department on 06/08/2019 for the symptoms described in the history of present illness. She was evaluated in the context of the global COVID-19 pandemic, which necessitated consideration that the patient might be at risk for infection with the SARS-CoV-2 virus that causes COVID-19. Institutional protocols and algorithms that pertain to the evaluation of patients at risk for COVID-19 are in a state of rapid change based on information released by regulatory bodies including the CDC and federal and state organizations. These policies and algorithms were followed during the patient's care in the ED.   Final Clinical Impressions(s) / ED Diagnoses   Final diagnoses:  Vomiting and diarrhea    ED Discharge Orders         Ordered    ondansetron (ZOFRAN ODT) 4 MG disintegrating tablet  Every 8 hours PRN     06/07/19 2349           Antonietta Breach, PA-C 06/08/19 0236    Fatima Blank, MD 06/11/19 [id number]

## 2019-06-07 NOTE — Discharge Instructions (Signed)
Avoid fried foods, fatty foods, greasy foods, and milk products until symptoms resolve. Drink plenty of clear liquids. We recommend the use of Zofran as prescribed for nausea/vomiting. Follow-up with your primary care doctor to ensure resolution of symptoms.  You should continue to quarantine until your COVID tests result.  If you test positive for coronavirus, quarantine for a full 14 days.

## 2019-06-10 LAB — NOVEL CORONAVIRUS, NAA (HOSP ORDER, SEND-OUT TO REF LAB; TAT 18-24 HRS): SARS-CoV-2, NAA: NOT DETECTED

## 2019-06-20 ENCOUNTER — Ambulatory Visit: Payer: Medicaid Other | Admitting: Medical

## 2019-06-27 ENCOUNTER — Encounter (INDEPENDENT_AMBULATORY_CARE_PROVIDER_SITE_OTHER): Payer: Self-pay | Admitting: Primary Care

## 2019-06-27 ENCOUNTER — Ambulatory Visit (INDEPENDENT_AMBULATORY_CARE_PROVIDER_SITE_OTHER): Payer: Medicaid Other | Admitting: Primary Care

## 2019-06-27 ENCOUNTER — Other Ambulatory Visit: Payer: Self-pay

## 2019-06-27 VITALS — BP 138/86 | HR 91 | Temp 98.5°F | Ht 68.0 in | Wt 314.0 lb

## 2019-06-27 DIAGNOSIS — M79671 Pain in right foot: Secondary | ICD-10-CM | POA: Diagnosis not present

## 2019-06-27 DIAGNOSIS — F3164 Bipolar disorder, current episode mixed, severe, with psychotic features: Secondary | ICD-10-CM

## 2019-06-27 DIAGNOSIS — F39 Unspecified mood [affective] disorder: Secondary | ICD-10-CM | POA: Insufficient documentation

## 2019-06-27 DIAGNOSIS — F319 Bipolar disorder, unspecified: Secondary | ICD-10-CM | POA: Insufficient documentation

## 2019-06-27 NOTE — Progress Notes (Signed)
Acute Office Visit  Subjective:    Patient ID: Natasha Long, female    DOB: Jul 16, 1996, 23 y.o.   MRN: [id number]  Chief Complaint  Patient presents with  . Establish Care    with NP  . Foot Pain    left foot. broke bone/tendon.. possible nerve damage     HPI Patient is in today for complaints of left foot pain and she had a tendon to rupture in the same foot. 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 Patient requested can you help me with this pain medication. I said yes I can send in ibuprofen she stated that's all well the bus is coming and I have to go exam her and she left.  Past Medical History:  Diagnosis Date  . Bipolar affective, manic ([place])   . Hypertension    diagnosed as child; stopped meds at 66 yo  . Hypoglycemia   . Insomnia   . Nephrotic syndrome   . Nephrotic syndrome   . PTSD (post-traumatic stress disorder)   . Renal disease     Past Surgical History:  Procedure Laterality Date  . CHOLECYSTECTOMY    . extraction of wisdom teeth    . RENAL BIOPSY      Family History  Problem Relation Age of Onset  . Diabetes Other   . Hypertension Other     Social History   Socioeconomic History  . Marital status: Single    Spouse name: Not on file  . Number of children: 1  . Years of education: Not on file  . Highest education level: 12th grade  Occupational History  . Not on file  Social Needs  . Financial resource strain: Patient refused  . Food insecurity    Worry: Patient refused    Inability: Patient refused  . Transportation needs    Medical: Patient refused    Non-medical: Patient refused  Tobacco Use  . Smoking status: Current Every Day Smoker    Packs/day: 0.50    Types: E-cigarettes  . Smokeless tobacco: Never Used  Substance and Sexual Activity  . Alcohol use: No  . Drug use: Yes    Types: Marijuana, Cocaine  . Sexual activity: Yes    Birth control/protection: Condom  Lifestyle  . Physical  activity    Days per week: Patient refused    Minutes per session: Patient refused  . Stress: Patient refused  Relationships  . Social Herbalist on phone: Patient refused    Gets together: Patient refused    Attends religious service: Patient refused    Active member of club or organization: Patient refused    Attends meetings of clubs or organizations: Patient refused    Relationship status: Patient refused  . Intimate partner violence    Fear of current or ex partner: Patient refused    Emotionally abused: Patient refused    Physically abused: Patient refused    Forced sexual activity: Patient refused  Other Topics Concern  . Not on file  Social History Narrative  . Not on file    Outpatient Medications Prior to Visit  Medication Sig Dispense Refill  . ARIPiprazole (ABILIFY MAINTENA IM) Inject 400 mg into the muscle every 30 (thirty) days.    . DULoxetine (CYMBALTA) 20 MG capsule Take 20 mg by mouth daily.    Marland Kitchen gabapentin (NEURONTIN) 800 MG tablet Take 800 mg by mouth 3 (three) times daily.    . hydrOXYzine (  VISTARIL) 50 MG capsule Take 50 mg by mouth 3 (three) times daily as needed for anxiety.     . propranolol (INDERAL) 10 MG tablet Take 1 tablet (10 mg total) by mouth 2 (two) times daily. 60 tablet 0  . citalopram (CELEXA) 20 MG tablet Take 1 tablet (20 mg total) by mouth daily. For mood (Patient not taking: Reported on 03/30/2019) 30 tablet 0  . gabapentin (NEURONTIN) 400 MG capsule Take 2 capsules (800 mg total) by mouth 3 (three) times daily. (Patient not taking: Reported on 06/07/2019) 180 capsule 0  . hydrOXYzine (ATARAX/VISTARIL) 25 MG tablet Take 1 tablet (25 mg total) by mouth 3 (three) times daily as needed for anxiety. (Patient not taking: Reported on 06/07/2019) 30 tablet 0  . nicotine (NICODERM CQ - DOSED IN MG/24 HOURS) 21 mg/24hr patch Place 1 patch (21 mg total) onto the skin daily. For smoking cessation (Patient not taking: Reported on 03/30/2019) 28 patch  0  . ondansetron (ZOFRAN ODT) 4 MG disintegrating tablet Take 1 tablet (4 mg total) by mouth every 8 (eight) hours as needed for nausea or vomiting. 10 tablet 0  . traZODone (DESYREL) 100 MG tablet Take 1 tablet (100 mg total) by mouth at bedtime as needed for sleep. (Patient not taking: Reported on 05/19/2019) 15 tablet 0   No facility-administered medications prior to visit.     Allergies  Allergen Reactions  . Prednisone Other (See Comments)    Pt states that this med caused pancreatitis.   . Prozac [Fluoxetine Hcl] Other (See Comments)    Panic attack   . Wellbutrin [Bupropion] Other (See Comments)    Panic attack    Review of Systems  Musculoskeletal:       Right foot pain with nerve damage   All other systems reviewed and are negative.      Objective:    Physical Exam  Constitutional: She is oriented to person, place, and time. She appears well-developed and well-nourished.  HENT:  Head: Atraumatic.  Neck: Neck supple.  Cardiovascular: Normal rate and regular rhythm.  Pulmonary/Chest: Effort normal and breath sounds normal.  Abdominal: Soft. Bowel sounds are normal. She exhibits distension.  Musculoskeletal:     Comments: Right foot pain  Neurological: She is oriented to person, place, and time.  Skin: Skin is warm and dry.    BP 138/86 (BP Location: Right Arm, Patient Position: Sitting, Cuff Size: Large)   Pulse 91   Temp 98.5 F (36.9 C) (Oral)   Ht 5\' 8"  (1.727 m)   Wt (!) 314 lb (142.4 kg)   LMP 05/17/2019 (Approximate)   SpO2 95%   Breastfeeding No   BMI 47.74 kg/m  Wt Readings from Last 3 Encounters:  06/27/19 (!) 314 lb (142.4 kg)  01/12/19 270 lb (122.5 kg)  12/20/18 300 lb (136.1 kg)    There are no preventive care reminders to display for this patient.  There are no preventive care reminders to display for this patient.   Lab Results  Component Value Date   TSH 2.120 09/15/2017   Lab Results  Component Value Date   WBC 10.6 (H)  05/19/2019   HGB 15.2 (H) 05/19/2019   HCT 46.0 05/19/2019   MCV 92.9 05/19/2019   PLT 243 05/19/2019   Lab Results  Component Value Date   NA 138 05/19/2019   K 4.3 05/19/2019   CO2 29 05/19/2019   GLUCOSE 97 05/19/2019   BUN 16 05/19/2019   CREATININE 0.96 05/19/2019  BILITOT 0.5 05/19/2019   ALKPHOS 49 05/19/2019   AST 22 05/19/2019   ALT 25 05/19/2019   PROT 7.2 05/19/2019   ALBUMIN 3.4 (L) 05/19/2019   CALCIUM 8.9 05/19/2019   ANIONGAP 7 05/19/2019   Lab Results  Component Value Date   CHOL 125 02/12/2019   Lab Results  Component Value Date   HDL 29 (L) 02/12/2019   Lab Results  Component Value Date   LDLCALC 66 02/12/2019   Lab Results  Component Value Date   TRIG 149 02/12/2019   Lab Results  Component Value Date   CHOLHDL 4.3 02/12/2019   Lab Results  Component Value Date   HGBA1C 5.4 02/12/2019       Assessment & Plan:   Problem List Items Addressed This Visit    Affective psychosis, bipolar ([place])    Other Visit Diagnoses    Right foot pain    -  Primary   Morbid obesity ([place])        Zelpha was seen today for establish care and foot pain.  Diagnoses and all orders for this visit:  Right foot pain Impression is subacute left 5th metacarpal fracture with callus formation followed by Dr Erlinda Hong orthopedics   Morbid obesity [facility]) Morbid obesity is 41 indicating an excess in caloric intake or underlining conditions. This may lead to other co-morbidities. Lifestyle modifications of diet and exercise may reduce obesity.   No orders of the defined types were placed in this encounter.    Kerin Perna, NP

## 2019-06-27 NOTE — Progress Notes (Signed)
Pt states pain increases when she is working or putting weight while walking

## 2019-07-30 ENCOUNTER — Other Ambulatory Visit: Payer: Self-pay

## 2019-07-30 ENCOUNTER — Emergency Department (HOSPITAL_COMMUNITY)
Admission: EM | Admit: 2019-07-30 | Discharge: 2019-08-01 | Disposition: A | Discharge status: Other Acute Inpatient Hospital | Payer: Medicaid Other | Attending: Emergency Medicine | Admitting: Emergency Medicine

## 2019-07-30 ENCOUNTER — Encounter (HOSPITAL_COMMUNITY): Payer: Self-pay

## 2019-07-30 DIAGNOSIS — F1494 Cocaine use, unspecified with cocaine-induced mood disorder: Secondary | ICD-10-CM | POA: Diagnosis not present

## 2019-07-30 DIAGNOSIS — Z20828 Contact with and (suspected) exposure to other viral communicable diseases: Secondary | ICD-10-CM | POA: Insufficient documentation

## 2019-07-30 DIAGNOSIS — F319 Bipolar disorder, unspecified: Secondary | ICD-10-CM

## 2019-07-30 DIAGNOSIS — R7989 Other specified abnormal findings of blood chemistry: Secondary | ICD-10-CM | POA: Diagnosis not present

## 2019-07-30 DIAGNOSIS — Z79899 Other long term (current) drug therapy: Secondary | ICD-10-CM | POA: Diagnosis not present

## 2019-07-30 DIAGNOSIS — R45851 Suicidal ideations: Secondary | ICD-10-CM | POA: Diagnosis not present

## 2019-07-30 DIAGNOSIS — N049 Nephrotic syndrome with unspecified morphologic changes: Secondary | ICD-10-CM

## 2019-07-30 DIAGNOSIS — Z9114 Patient's other noncompliance with medication regimen: Secondary | ICD-10-CM | POA: Insufficient documentation

## 2019-07-30 DIAGNOSIS — F3131 Bipolar disorder, current episode depressed, mild: Secondary | ICD-10-CM | POA: Diagnosis present

## 2019-07-30 DIAGNOSIS — F1490 Cocaine use, unspecified, uncomplicated: Secondary | ICD-10-CM | POA: Diagnosis not present

## 2019-07-30 DIAGNOSIS — E876 Hypokalemia: Secondary | ICD-10-CM

## 2019-07-30 DIAGNOSIS — F1410 Cocaine abuse, uncomplicated: Secondary | ICD-10-CM | POA: Diagnosis not present

## 2019-07-30 DIAGNOSIS — F329 Major depressive disorder, single episode, unspecified: Secondary | ICD-10-CM | POA: Diagnosis present

## 2019-07-30 DIAGNOSIS — F17290 Nicotine dependence, other tobacco product, uncomplicated: Secondary | ICD-10-CM | POA: Diagnosis not present

## 2019-07-30 DIAGNOSIS — Z91148 Patient's other noncompliance with medication regimen for other reason: Secondary | ICD-10-CM

## 2019-07-30 DIAGNOSIS — I10 Essential (primary) hypertension: Secondary | ICD-10-CM | POA: Insufficient documentation

## 2019-07-30 DIAGNOSIS — K5900 Constipation, unspecified: Secondary | ICD-10-CM | POA: Diagnosis not present

## 2019-07-30 DIAGNOSIS — J039 Acute tonsillitis, unspecified: Secondary | ICD-10-CM | POA: Diagnosis not present

## 2019-07-30 DIAGNOSIS — Z590 Homelessness: Secondary | ICD-10-CM | POA: Insufficient documentation

## 2019-07-30 DIAGNOSIS — E8809 Other disorders of plasma-protein metabolism, not elsewhere classified: Secondary | ICD-10-CM

## 2019-07-30 DIAGNOSIS — Z915 Personal history of self-harm: Secondary | ICD-10-CM | POA: Diagnosis not present

## 2019-07-30 LAB — COMPREHENSIVE METABOLIC PANEL
ALT: 52 U/L — ABNORMAL HIGH (ref 0–44)
AST: 73 U/L — ABNORMAL HIGH (ref 15–41)
Albumin: 2 g/dL — ABNORMAL LOW (ref 3.5–5.0)
Alkaline Phosphatase: 52 U/L (ref 38–126)
Anion gap: 11 (ref 5–15)
BUN: 24 mg/dL — ABNORMAL HIGH (ref 6–20)
CO2: 24 mmol/L (ref 22–32)
Calcium: 7.7 mg/dL — ABNORMAL LOW (ref 8.9–10.3)
Chloride: 97 mmol/L — ABNORMAL LOW (ref 98–111)
Creatinine, Ser: 3.04 mg/dL — ABNORMAL HIGH (ref 0.44–1.00)
GFR calc Af Amer: 24 mL/min — ABNORMAL LOW (ref >60)
GFR calc non Af Amer: 20 mL/min — ABNORMAL LOW (ref >60)
Glucose, Bld: 105 mg/dL — ABNORMAL HIGH (ref 70–99)
Potassium: 3.4 mmol/L — ABNORMAL LOW (ref 3.5–5.1)
Sodium: 132 mmol/L — ABNORMAL LOW (ref 135–145)
Total Bilirubin: 0.5 mg/dL (ref 0.3–1.2)
Total Protein: 6 g/dL — ABNORMAL LOW (ref 6.5–8.1)

## 2019-07-30 LAB — SARS CORONAVIRUS 2 BY RT PCR (HOSPITAL ORDER, PERFORMED IN ~~LOC~~ HOSPITAL LAB): SARS Coronavirus 2: NEGATIVE

## 2019-07-30 LAB — BASIC METABOLIC PANEL
Anion gap: 7 (ref 5–15)
BUN: 23 mg/dL — ABNORMAL HIGH (ref 6–20)
CO2: 25 mmol/L (ref 22–32)
Calcium: 6.3 mg/dL — CL (ref 8.9–10.3)
Chloride: 103 mmol/L (ref 98–111)
Creatinine, Ser: 2.66 mg/dL — ABNORMAL HIGH (ref 0.44–1.00)
GFR calc Af Amer: 28 mL/min — ABNORMAL LOW (ref >60)
GFR calc non Af Amer: 24 mL/min — ABNORMAL LOW (ref >60)
Glucose, Bld: 99 mg/dL (ref 70–99)
Potassium: 2.8 mmol/L — ABNORMAL LOW (ref 3.5–5.1)
Sodium: 135 mmol/L (ref 135–145)

## 2019-07-30 LAB — CK: Total CK: 85 U/L (ref 38–234)

## 2019-07-30 LAB — SALICYLATE LEVEL: Salicylate Lvl: <7 mg/dL (ref 2.8–30.0)

## 2019-07-30 LAB — CBC
HCT: 45.4 % (ref 36.0–46.0)
Hemoglobin: 14.9 g/dL (ref 12.0–15.0)
MCH: 29.7 pg (ref 26.0–34.0)
MCHC: 32.8 g/dL (ref 30.0–36.0)
MCV: 90.6 fL (ref 80.0–100.0)
Platelets: 234 10*3/uL (ref 150–400)
RBC: 5.01 MIL/uL (ref 3.87–5.11)
RDW: 13.2 % (ref 11.5–15.5)
WBC: 12.7 10*3/uL — ABNORMAL HIGH (ref 4.0–10.5)
nRBC: 0 % (ref 0.0–0.2)

## 2019-07-30 LAB — ACETAMINOPHEN LEVEL: Acetaminophen (Tylenol), Serum: <10 ug/mL — ABNORMAL LOW (ref 10–30)

## 2019-07-30 LAB — GROUP A STREP BY PCR: Group A Strep by PCR: DETECTED — AB

## 2019-07-30 LAB — MAGNESIUM: Magnesium: 1.9 mg/dL (ref 1.7–2.4)

## 2019-07-30 LAB — ETHANOL: Alcohol, Ethyl (B): <10 mg/dL (ref <10)

## 2019-07-30 LAB — I-STAT BETA HCG BLOOD, ED (MC, WL, AP ONLY): I-stat hCG, quantitative: <5 m[IU]/mL (ref <5)

## 2019-07-30 MED ORDER — CALCIUM CARBONATE ANTACID 500 MG PO CHEW
1.0000 | CHEWABLE_TABLET | Freq: Two times a day (BID) | ORAL | Status: DC
  Administered 2019-07-31 (×2): 200 mg via ORAL
  Filled 2019-07-30 (×3): qty 1

## 2019-07-30 MED ORDER — MAGNESIUM CITRATE PO SOLN
1.0000 | Freq: Once | ORAL | Status: AC
  Administered 2019-07-30: 21:00:00 1 via ORAL
  Filled 2019-07-30: qty 296

## 2019-07-30 MED ORDER — SODIUM CHLORIDE 0.9 % IV BOLUS
3000.0000 mL | Freq: Once | INTRAVENOUS | Status: AC
  Administered 2019-07-30: 3000 mL via INTRAVENOUS

## 2019-07-30 MED ORDER — POTASSIUM CHLORIDE CRYS ER 20 MEQ PO TBCR
40.0000 meq | EXTENDED_RELEASE_TABLET | Freq: Once | ORAL | Status: AC
  Administered 2019-07-30: 40 meq via ORAL
  Filled 2019-07-30: qty 2

## 2019-07-30 MED ORDER — PENICILLIN V POTASSIUM 500 MG PO TABS
500.0000 mg | ORAL_TABLET | Freq: Four times a day (QID) | ORAL | Status: DC
  Administered 2019-07-30 – 2019-08-01 (×7): 500 mg via ORAL
  Filled 2019-07-30 (×6): qty 1

## 2019-07-30 MED ORDER — PHENOL 1.4 % MT LIQD
1.0000 | OROMUCOSAL | Status: DC | PRN
  Filled 2019-07-30: qty 177

## 2019-07-30 NOTE — ED Notes (Signed)
MD informed of abnormal labs

## 2019-07-30 NOTE — ED Triage Notes (Signed)
States not taking psych medication for about a month while she was using crack and last used last night now has SI wanting to take and overdose of pills and has chest pain.

## 2019-07-30 NOTE — ED Provider Notes (Signed)
[facility] DEPT Provider Note   CSN: [id number] Arrival date & time: 07/30/19  1431     History   Chief Complaint Chief Complaint  Patient presents with  . Suicidal  . Addiction Problem    HPI Natasha Long is a 23 y.o. female.     HPI   She presents for evaluation of suicidal ideation with plan to take pills.  She states she is homeless and has been using crack and started taking her usual medicine, which is prescribed for anxiety and PTSD.  She denies recent illnesses including fever, chills, vomiting, dizziness, cough, shortness of breath or chest pain.  There are no other known modifying factors.  Past Medical History:  Diagnosis Date  . Bipolar affective, manic ([place])   . Hypertension    diagnosed as child; stopped meds at 58 yo  . Hypoglycemia   . Insomnia   . Nephrotic syndrome   . Nephrotic syndrome   . PTSD (post-traumatic stress disorder)   . Renal disease     Patient Active Problem List   Diagnosis Date Noted  . Affective psychosis, bipolar ([place]) 06/27/2019  . Closed displaced fracture of shaft of fifth metacarpal bone of left hand 02/28/2019  . MDD (major depressive disorder), recurrent severe, without psychosis ([place]) 02/08/2019  . MDD (major depressive disorder), severe ([place]) 01/13/2019  . Borderline personality disorder ([place]) 11/16/2018  . Moderate cannabis use disorder ([place]) 11/16/2018  . Severe recurrent major depression without psychotic features ([place]) 11/15/2018  . 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 Chronic hypertension 04/04/2018  . Nephrotic syndrome 04/01/2018  . Supervision of high risk pregnancy, antepartum 04/01/2018  . Rh negative status during pregnancy 04/01/2018  . PTSD (post-traumatic stress disorder) 04/01/2018  . Bipolar affective disorder, depressed, mild ([place]) 04/09/2017  . Cocaine abuse ([place]) 04/09/2017    Past Surgical History:  Procedure Laterality Date  . CHOLECYSTECTOMY     . extraction of wisdom teeth    . RENAL BIOPSY       OB History    Gravida  2   Para  1   Term  1   Preterm      AB      Living  1     SAB      TAB      Ectopic      Multiple      Live Births  1            Home Medications    Prior to Admission medications   Medication Sig Start Date End Date Taking? Authorizing Provider  ARIPiprazole (ABILIFY MAINTENA IM) Inject 400 mg into the muscle every 30 (thirty) days.   Yes [provider]  propranolol (INDERAL) 10 MG tablet Take 1 tablet (10 mg total) by mouth 2 (two) times daily. Patient not taking: Reported on 07/30/2019 02/13/19   Connye Burkitt, NP    Family History Family History  Problem Relation Age of Onset  . Diabetes Other   . Hypertension Other     Social History Social History   Tobacco Use  . Smoking status: Current Every Day Smoker    Packs/day: 0.50    Types: E-cigarettes  . Smokeless tobacco: Never Used  Substance Use Topics  . Alcohol use: No  . Drug use: Yes    Types: Marijuana, Cocaine     Allergies   Prednisone, Prozac [fluoxetine hcl], and Wellbutrin [bupropion]   Review of Systems Review of Systems  All other systems reviewed and are negative.    Physical Exam Updated Vital Signs BP 127/74 (BP Location: Left Arm)   Pulse 84   Temp 99.4 F (37.4 C) (Oral)   Resp 16   Ht '5\' 8"'$  (1.727 m)   Wt (!) 142.4 kg   SpO2 97%   BMI 47.73 kg/m   Physical Exam Vitals signs and nursing note reviewed.  Constitutional:      General: She is not in acute distress.    Appearance: She is well-developed. She is obese. She is not ill-appearing, toxic-appearing or diaphoretic.  HENT:     Head: Normocephalic and atraumatic.     Right Ear: External ear normal.     Left Ear: External ear normal.  Eyes:     Conjunctiva/sclera: Conjunctivae normal.     Pupils: Pupils are equal, round, and reactive to light.  Neck:     Musculoskeletal: Normal range of motion and neck  supple.     Trachea: Phonation normal.  Cardiovascular:     Rate and Rhythm: Normal rate.  Pulmonary:     Effort: Pulmonary effort is normal.  Musculoskeletal: Normal range of motion.  Skin:    General: Skin is warm and dry.  Neurological:     Mental Status: She is alert and oriented to person, place, and time.     Cranial Nerves: No cranial nerve deficit.     Sensory: No sensory deficit.     Motor: No abnormal muscle tone.     Coordination: Coordination normal.  Psychiatric:        Mood and Affect: Mood normal.        Behavior: Behavior normal.        Thought Content: Thought content normal.        Judgment: Judgment normal.      ED Treatments / Results  Labs (all labs ordered are listed, but only abnormal results are displayed) Labs Reviewed  GROUP A STREP BY PCR - Abnormal; Notable for the following components:      Result Value   Group A Strep by PCR DETECTED (*)    All other components within normal limits  COMPREHENSIVE METABOLIC PANEL - Abnormal; Notable for the following components:   Sodium 132 (*)    Potassium 3.4 (*)    Chloride 97 (*)    Glucose, Bld 105 (*)    BUN 24 (*)    Creatinine, Ser 3.04 (*)    Calcium 7.7 (*)    Total Protein 6.0 (*)    Albumin 2.0 (*)    AST 73 (*)    ALT 52 (*)    GFR calc non Af Amer 20 (*)    GFR calc Af Amer 24 (*)    All other components within normal limits  ACETAMINOPHEN LEVEL - Abnormal; Notable for the following components:   Acetaminophen (Tylenol), Serum <10 (*)    All other components within normal limits  CBC - Abnormal; Notable for the following components:   WBC 12.7 (*)    All other components within normal limits  BASIC METABOLIC PANEL - Abnormal; Notable for the following components:   Potassium 2.8 (*)    BUN 23 (*)    Creatinine, Ser 2.66 (*)    Calcium 6.3 (*)    GFR calc non Af Amer 24 (*)    GFR calc Af Amer 28 (*)    All other components within normal limits  SARS CORONAVIRUS 2 (HOSPITAL ORDER,  [place]  [facility])  ETHANOL  SALICYLATE LEVEL  CK  RAPID URINE DRUG SCREEN, HOSP PERFORMED  MAGNESIUM  I-STAT BETA HCG BLOOD, ED ([place], [facility], [facility] ONLY)    EKG EKG Interpretation  Date/Time:  Sunday July 30 2019 15:00:02 EDT Ventricular Rate:  102 PR Interval:    QRS Duration: 80 QT Interval:  321 QTC Calculation: 419 R Axis:   27 Text Interpretation:  Sinus tachycardia Since last tracing rate faster Confirmed by Daleen Bo [phone]) on 07/30/2019 3:02:57 PM   Radiology No results found.  Procedures .Critical Care Performed by: Daleen Bo, MD Authorized by: Daleen Bo, MD   Critical care provider statement:    Critical care time (minutes):  35   Critical care start time:  07/30/2019 3:30 PM   Critical care end time:  07/30/2019 9:06 PM   Critical care time was exclusive of:  Separately billable procedures and treating other patients   Critical care was necessary to treat or prevent imminent or life-threatening deterioration of the following conditions:  Metabolic crisis   Critical care was time spent personally by me on the following activities:  Blood draw for specimens, development of treatment plan with patient or surrogate, discussions with consultants, evaluation of patient's response to treatment, examination of patient, obtaining history from patient or surrogate, ordering and performing treatments and interventions, ordering and review of laboratory studies, pulse oximetry, re-evaluation of patient's condition, review of old charts and ordering and review of radiographic studies   (including critical care time)  Medications Ordered in ED Medications  phenol (CHLORASEPTIC) mouth spray 1 spray (has no administration in time range)  potassium chloride SA (K-DUR) CR tablet 40 mEq (has no administration in time range)  magnesium citrate solution 1 Bottle (has no administration in time range)  penicillin v potassium (VEETID) tablet 500 mg (has no  administration in time range)  calcium carbonate (TUMS - dosed in mg elemental calcium) chewable tablet 200 mg of elemental calcium (has no administration in time range)  sodium chloride 0.9 % bolus 3,000 mL (0 mLs Intravenous Stopped 07/30/19 1823)     Initial Impression / Assessment and Plan / ED Course  I have reviewed the triage vital signs and the nursing notes.  Pertinent labs & imaging results that were available during my care of the patient were reviewed by me and considered in my medical decision making (see chart for details).  Clinical Course as of Jul 29 2100  Nancy Fetter Jul 30, 2019  7096 Normal  CK [EW]  1849 Normal  Ethanol [EW]  1849 Normal  Acetaminophen level(!) [EW]  1849 Normal  I-Stat beta hCG blood, ED [EW]  1849 Normal except white count elevated  cbc(!) [EW]  1849 Normal except sodium low, potassium low, chloride low, glucose high, BUN high, creatinine high, calcium low, total protein low, albumin low, AST high, ALT high, GFR low  Comprehensive metabolic panel(!) [EW]  2836 Abnormal, present  Group A Strep by PCR(!) [EW]  2058 I discussed the case, and be met findings with the on-call nephrologist, Dr. Moshe Cipro.  He took the patient's name and medical record number and will contact the patient for outpatient follow-up for possible nephrotic syndrome with elevated creatinine.  She agrees with symptomatic treatment at this time, and continued psychiatric stabilization.   [EW]    Clinical Course User Index [EW] Daleen Bo, MD        Patient Vitals for the past 24 hrs:  BP Temp Temp src Pulse Resp SpO2 Height  Weight  07/30/19 1821 127/74 99.4 F (37.4 C) Oral 84 16 97 % - -  07/30/19 1451 118/83 98.2 F (36.8 C) Oral 99 16 97 % _0  (1.727 m) (!) 142.4 kg    9:01 PM Reevaluation with update and discussion. After initial assessment and treatment, an updated evaluation reveals no change in clinical status. Daleen Bo   Medical Decision Making:  Patient presented with medication noncompliance, suicidal ideation and cocaine abuse.  Incidental findings for unsuspected metabolic abnormalities likely related to prior diagnosis of nephrotic syndrome which is now recurrent.  Patient is clinically well from a metabolic standpoint.  She complains of a sore throat and was found to have streptococcal pharyngitis.  She was treated with 3 L saline, with slight improvement of creatinine, but worsening forward potassium and calcium levels.  She remains clinically stable and can be treated for psychiatric issues which are the dominant acute medical issue for her.  Nephrology has been consulted and agreed to see the patient in the outpatient setting for management of likely recurrent nephrotic syndrome and metabolic abnormalities.  They plan on contacting her.  Patient complained of constipation was treated for that with magnesium citrate.  I suspect there is a component of malnutrition and poor oral hydration, contributing to her metabolic abnormalities.  The patient is stable for admission and treatment by psychiatry team.  She will need ongoing management for tonsillitis, and supplementation with calcium and potassium, based on repeat electrolyte testing.  CRITICAL CARE- yes Performed by: Daleen Bo  Nursing Notes Reviewed/ Care Coordinated Applicable Imaging Reviewed Interpretation of Laboratory Data incorporated into ED treatment  TTS plan on admitting the patient.  Been ordered for morning.  Plan-as per oncoming provider team in conjunction with TTS.  Final Clinical Impressions(s) / ED Diagnoses   Final diagnoses:  Suicidal ideation  Bipolar affective disorder, remission status unspecified ([place])  Noncompliance with medication regimen  Elevated serum creatinine  Nephrotic syndrome  Tonsillitis  Hypokalemia  Hypocalcemia  Constipation, unspecified constipation type  Cocaine abuse [facility])    ED Discharge Orders    None       Daleen Bo, MD 07/30/19 2106

## 2019-07-30 NOTE — Progress Notes (Signed)
TTS consulted with Lindon Romp, NP who recommends inpt tx. TTS to seek placement. EDP Daleen Bo, MD and TCU nurse have been advised.  Lind Covert, MSW, LCSW Therapeutic Triage Specialist  [phone]

## 2019-07-30 NOTE — Progress Notes (Signed)
07/30/2019  1832  COVID test done. Notified MD that patient is c/o neck swelling. Per MD he will come to see her.

## 2019-07-30 NOTE — Progress Notes (Signed)
07/30/2019  1650  Patient refused COVID 19 test at this time. She asked for it to be done later. Informed patient if we need to place her it will be required for placement.

## 2019-07-30 NOTE — ED Notes (Signed)
CRITICAL VALUE ALERT  Critical Value:  Calcium 6.3  Date & Time Notied:  8/30 2020  Provider Notified: Eulis Foster  Orders Received/Actions taken: No new orders given
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
## 2019-07-31 ENCOUNTER — Encounter (HOSPITAL_COMMUNITY): Payer: Self-pay | Admitting: Registered Nurse

## 2019-07-31 DIAGNOSIS — Z915 Personal history of self-harm: Secondary | ICD-10-CM

## 2019-07-31 DIAGNOSIS — F1490 Cocaine use, unspecified, uncomplicated: Secondary | ICD-10-CM

## 2019-07-31 DIAGNOSIS — R45851 Suicidal ideations: Secondary | ICD-10-CM

## 2019-07-31 DIAGNOSIS — F3131 Bipolar disorder, current episode depressed, mild: Secondary | ICD-10-CM

## 2019-07-31 DIAGNOSIS — Z590 Homelessness: Secondary | ICD-10-CM

## 2019-07-31 DIAGNOSIS — F1494 Cocaine use, unspecified with cocaine-induced mood disorder: Secondary | ICD-10-CM

## 2019-07-31 LAB — RAPID URINE DRUG SCREEN, HOSP PERFORMED
Amphetamines: NOT DETECTED
Barbiturates: NOT DETECTED
Benzodiazepines: NOT DETECTED
Cocaine: POSITIVE — AB
Opiates: NOT DETECTED
Tetrahydrocannabinol: POSITIVE — AB

## 2019-07-31 LAB — BASIC METABOLIC PANEL
Anion gap: 10 (ref 5–15)
BUN: 23 mg/dL — ABNORMAL HIGH (ref 6–20)
CO2: 19 mmol/L — ABNORMAL LOW (ref 22–32)
Calcium: 6.7 mg/dL — ABNORMAL LOW (ref 8.9–10.3)
Chloride: 105 mmol/L (ref 98–111)
Creatinine, Ser: 2.52 mg/dL — ABNORMAL HIGH (ref 0.44–1.00)
GFR calc Af Amer: 30 mL/min — ABNORMAL LOW (ref >60)
GFR calc non Af Amer: 26 mL/min — ABNORMAL LOW (ref >60)
Glucose, Bld: 99 mg/dL (ref 70–99)
Potassium: 3.4 mmol/L — ABNORMAL LOW (ref 3.5–5.1)
Sodium: 134 mmol/L — ABNORMAL LOW (ref 135–145)

## 2019-07-31 MED ORDER — PENICILLIN V POTASSIUM 500 MG PO TABS: 500.0000 mg | ORAL_TABLET | Freq: Two times a day (BID) | ORAL | 0 refills | Status: AC

## 2019-07-31 NOTE — BH Assessment (Signed)
[facility] Assessment Progress Note  Per Buford Dresser, DO, this voluntary pt requires psychiatric hospitalization at this time.  The following facilities have been contacted to seek placement for this pt, with results as noted:  Beds available, information sent, decision pending:  [facility], [place] Coordinator [phone]

## 2019-07-31 NOTE — Progress Notes (Addendum)
CSW received a call from [place] at [facility] stating the patient has been offered a bed and has been accepted and that the pt can arrive on 9/1 after 8am.  The pt's accepting doctor is Dr. Selinda Flavin.  The room number will TBD on [place].  The number for report is [phone].  CSW will update RN.  Pt is voluntary.  5:27 PM RN updated.  Alphonse Guild. Blakeleigh Domek, Latanya Presser, LCAS Clinical Social Worker Ph: [phone]

## 2019-07-31 NOTE — Discharge Summary (Addendum)
Patient accepted to [facility] for inpatient treatment for transfer on 08/01/2019. Letitia Libra FNP  Patient's chart reviewed. Reviewed the information documented and agree with the treatment plan.  Buford Dresser, DO 07/31/19 5:59 PM

## 2019-07-31 NOTE — Discharge Instructions (Addendum)
For your mental health needs, you are advised to continue treatment with [facility]:       [facility]      [address]      [place], [place] [address]      [phone]      Crisis number: [phone]  To help you maintain a sober lifestyle, a substance abuse treatment program may be beneficial to you.  Contact one of the following facilities at your earliest opportunity to ask about enrolling:  RESIDENTIAL PROGRAMS:       [facility]      [place], [place] [address]      [phone]       [facility]      [address] [place], [place] [address]      [phone]  OUTPATIENT PROGRAMS:       [facility],  [address]      [phone]      New patients are seen at their walk-in clinic.  Walk-in hours are Monday - Friday from 8:30 am - 12:00 pm, and from 1:00 pm - 2:30 pm.  Walk-in patients are seen on a first come, first served basis, so try to arrive as early as possible for the best chance of being seen the same day.

## 2019-07-31 NOTE — ED Notes (Signed)
Tele psych machine at bedside 

## 2019-07-31 NOTE — Consult Note (Addendum)
Telepsych Consultation   Reason for Consult:  SI, Substance use disorder  Referring Physician:  Daleen Bo, MD Location of Patient: [facility]  Location of Provider: [place] [facility]  Patient Identification: Natasha Long MRN:  [id number] Principal Diagnosis: Bipolar affective disorder, depressed, mild ([place]) Diagnosis:  Principal Problem:   Bipolar affective disorder, depressed, mild ([place])   Total Time spent with patient: 20 minutes  Subjective:   Natasha Long is a 23 y.o. female patient admitted with "feeling suicidal because I have been doing cocaine for a month." Patient alert and oriented x4 during evaluation, answers appropriately. Patient initially denies SI, HI and AVH. Patient states "I have not been taking my meds for one month." Patient has outpatient follow-up at [facility]. Patient endorses "I have a history of borderline personality disorder and PTSD."  Prior to discharge patient seen by peer support, verbalizes thoughts to self-harm if discharged from hospital.    HPI:  per TTS note: Natasha Long is an 23 y.o. female who presents to the ED voluntarily. Pt reports she has been experiencing SI with a plan to OD on her medication. Pt states she has attempted suicide 3 times in the past. Pt has been admitted to multiple inpt facilities in the past. Pt reports she is suicidal because she relapsed on crack cocaine. Pt states she was sober for 3 years and began using about 2 months ago. Pt states she is homeless and has no supports or resources. Pt states she feels helpless, worthless, and feels like a failure. Pt states she wants to "get clean" and she feels hopeless. Pt denies HI and denies AVH. [name] most recent inpt admission was March 2020 to [facility] c/o SI.  Past Psychiatric History: bipolar disorder, cocaine use disorder  Risk to Self: Suicidal Ideation: Yes-Currently Present Suicidal Intent: Yes-Currently Present Is patient at risk for suicide?:  Yes Suicidal Plan?: Yes-Currently Present Specify Current Suicidal Plan: pt states she has a plan to OD on medication Access to Means: Yes Specify Access to Suicidal Means: pt has access to medication What has been your use of drugs/alcohol within the last 12 months?: daily crack use How many times?: 3 Other Self Harm Risks: substance abuse, depression, SI Triggers for Past Attempts: Other personal contacts, Unpredictable Intentional Self Injurious Behavior: None Risk to Others: Homicidal Ideation: No Thoughts of Harm to Others: No Current Homicidal Intent: No Current Homicidal Plan: No Access to Homicidal Means: No History of harm to others?: No Assessment of Violence: None Noted Does patient have access to weapons?: No Criminal Charges Pending?: No Does patient have a court date: No Prior Inpatient Therapy: Prior Inpatient Therapy: Yes Prior Therapy Dates: 2020, 2019 Prior Therapy Facilty/Provider(s): [place] [place] Reason for Treatment: BIPOLAR, BORDERLINE PERSONALITY, SUBSTANCE ABUSE Prior Outpatient Therapy: Prior Outpatient Therapy: Yes Prior Therapy Dates: ONGOING Prior Therapy Facilty/Provider(s): [place] Reason for Treatment: MH ISSUES Does patient have an ACCT team?: No Does patient have Intensive In-House Services?  : No Does patient have [facility] services? : Yes Does patient have P4CC services?: No  Past Medical History:  Past Medical History:  Diagnosis Date  . Bipolar affective, manic ([place])   . Hypertension    diagnosed as child; stopped meds at 59 yo  . Hypoglycemia   . Insomnia   . Nephrotic syndrome   . Nephrotic syndrome   . PTSD (post-traumatic stress disorder)   . Renal disease     Past Surgical History:  Procedure Laterality Date  . CHOLECYSTECTOMY    .  extraction of wisdom teeth    . RENAL BIOPSY     Family History:  Family History  Problem Relation Age of Onset  . Diabetes Other   . Hypertension Other    Family Psychiatric   History: Mother-schizophrenia  Social History:  Social History   Substance and Sexual Activity  Alcohol Use No     Social History   Substance and Sexual Activity  Drug Use Yes  . Types: Marijuana, Cocaine    Social History   Socioeconomic History  . Marital status: Single    Spouse name: Not on file  . Number of children: 1  . Years of education: Not on file  . Highest education level: 12th grade  Occupational History  . Not on file  Social Needs  . Financial resource strain: Patient refused  . Food insecurity    Worry: Patient refused    Inability: Patient refused  . Transportation needs    Medical: Patient refused    Non-medical: Patient refused  Tobacco Use  . Smoking status: Current Every Day Smoker    Packs/day: 0.50    Types: E-cigarettes  . Smokeless tobacco: Never Used  Substance and Sexual Activity  . Alcohol use: No  . Drug use: Yes    Types: Marijuana, Cocaine  . Sexual activity: Yes    Birth control/protection: Condom  Lifestyle  . Physical activity    Days per week: Patient refused    Minutes per session: Patient refused  . Stress: Patient refused  Relationships  . Social Herbalist on phone: Patient refused    Gets together: Patient refused    Attends religious service: Patient refused    Active member of club or organization: Patient refused    Attends meetings of clubs or organizations: Patient refused    Relationship status: Patient refused  Other Topics Concern  . Not on file  Social History Narrative  . Not on file   Additional Social History:    Allergies:   Allergies  Allergen Reactions  . Prednisone Other (See Comments)    Pt states that this med caused pancreatitis.   . Prozac [Fluoxetine Hcl] Other (See Comments)    Panic attack   . Wellbutrin [Bupropion] Other (See Comments)    Panic attack    Labs:  Results for orders placed or performed during the hospital encounter of 07/30/19 (from the past 48 hour(s))   Rapid urine drug screen (hospital performed)     Status: Abnormal   Collection Time: 07/30/19  2:55 PM  Result Value Ref Range   Opiates NONE DETECTED NONE DETECTED   Cocaine POSITIVE (A) NONE DETECTED   Benzodiazepines NONE DETECTED NONE DETECTED   Amphetamines NONE DETECTED NONE DETECTED   Tetrahydrocannabinol POSITIVE (A) NONE DETECTED   Barbiturates NONE DETECTED NONE DETECTED    Comment: (NOTE) DRUG SCREEN FOR MEDICAL PURPOSES ONLY.  IF CONFIRMATION IS NEEDED FOR ANY PURPOSE, NOTIFY LAB WITHIN 5 DAYS. LOWEST DETECTABLE LIMITS FOR URINE DRUG SCREEN Drug Class                     Cutoff (ng/mL) Amphetamine and metabolites    1000 Barbiturate and metabolites    200 Benzodiazepine                 [id number] Tricyclics and metabolites     300 Opiates and metabolites        300 Cocaine and metabolites  300 THC                            50 Performed at [facility], [place] [address] [place], [place] [address]   Comprehensive metabolic panel     Status: Abnormal   Collection Time: 07/30/19  3:18 PM  Result Value Ref Range   Sodium 132 (L) 135 - 145 mmol/L   Potassium 3.4 (L) 3.5 - 5.1 mmol/L   Chloride 97 (L) 98 - 111 mmol/L   CO2 24 22 - 32 mmol/L   Glucose, Bld 105 (H) 70 - 99 mg/dL   BUN 24 (H) 6 - 20 mg/dL   Creatinine, Ser 3.04 (H) 0.44 - 1.00 mg/dL   Calcium 7.7 (L) 8.9 - 10.3 mg/dL   Total Protein 6.0 (L) 6.5 - 8.1 g/dL   Albumin 2.0 (L) 3.5 - 5.0 g/dL   AST 73 (H) 15 - 41 U/L   ALT 52 (H) 0 - 44 U/L   Alkaline Phosphatase 52 38 - 126 U/L   Total Bilirubin 0.5 0.3 - 1.2 mg/dL   GFR calc non Af Amer 20 (L) >60 mL/min   GFR calc Af Amer 24 (L) >60 mL/min   Anion gap 11 5 - 15    Comment: Performed at [facility], [place] [address], [place], [place] [address]  Ethanol     Status: None   Collection Time: 07/30/19  3:18 PM  Result Value Ref Range   Alcohol, Ethyl (B) <10 <10 mg/dL    Comment: (NOTE) Lowest detectable limit for  serum alcohol is 10 mg/dL. For medical purposes only. Performed at [facility], [place] [address], [place], [place] [id number]   Salicylate level     Status: None   Collection Time: 07/30/19  3:18 PM  Result Value Ref Range   Salicylate Lvl [id number] 2.8 - 30.0 mg/dL    Comment: Performed at [facility], [place] [address], [place], [place] [address]  Acetaminophen level     Status: Abnormal   Collection Time: 07/30/19  3:18 PM  Result Value Ref Range   Acetaminophen (Tylenol), Serum <10 (L) 10 - 30 ug/mL    Comment: (NOTE) Therapeutic concentrations vary significantly. A range of 10-30 ug/mL  may be an effective concentration for many patients. However, some  are best treated at concentrations outside of this range. Acetaminophen concentrations >150 ug/mL at 4 hours after ingestion  and >50 ug/mL at 12 hours after ingestion are often associated with  toxic reactions. Performed at [facility], [place] [address] [place], [place] [address]   cbc     Status: Abnormal   Collection Time: 07/30/19  3:18 PM  Result Value Ref Range   WBC 12.7 (H) 4.0 - 10.5 K/uL   RBC 5.01 3.87 - 5.11 MIL/uL   Hemoglobin 14.9 12.0 - 15.0 g/dL   HCT 45.4 36.0 - 46.0 %   MCV 90.6 80.0 - 100.0 fL   MCH 29.7 26.0 - 34.0 pg   MCHC 32.8 30.0 - 36.0 g/dL   RDW 13.2 11.5 - 15.5 %   Platelets 234 150 - 400 K/uL   nRBC 0.0 0.0 - 0.2 %    Comment: Performed at [facility], [place] [address] [place], [place] [address]  CK     Status: None   Collection Time: 07/30/19  3:18 PM  Result Value Ref Range  Total CK 85 38 - 234 U/L    Comment: Performed at [facility], [place] [address], [place], [place] [address]  I-Stat beta hCG blood, ED     Status: None   Collection Time: 07/30/19  3:32 PM  Result Value Ref Range   I-stat hCG, quantitative <5.0 <5 mIU/mL   Comment 3            Comment:   GEST. AGE      CONC.  (mIU/mL)   <=1 WEEK         5 - 50     2 WEEKS       50 - 500     3 WEEKS       100 - 10,000     4 WEEKS     1,000 - 30,000        FEMALE AND NON-PREGNANT FEMALE:     LESS THAN 5 mIU/mL   SARS Coronavirus 2 [facility] order, Performed in [facility] hospital lab) Nasopharyngeal Nasopharyngeal Swab     Status: None   Collection Time: 07/30/19  3:44 PM   Specimen: Nasopharyngeal Swab  Result Value Ref Range   SARS Coronavirus 2 NEGATIVE NEGATIVE    Comment: (NOTE) If result is NEGATIVE SARS-CoV-2 target nucleic acids are NOT DETECTED. The SARS-CoV-2 RNA is generally detectable in upper and lower  respiratory specimens during the acute phase of infection. The lowest  concentration of SARS-CoV-2 viral copies this assay can detect is 250  copies / mL. A negative result does not preclude SARS-CoV-2 infection  and should not be used as the sole basis for treatment or other  patient management decisions.  A negative result may occur with  improper specimen collection / handling, submission of specimen other  than nasopharyngeal swab, presence of viral mutation(s) within the  areas targeted by this assay, and inadequate number of viral copies  (<250 copies / mL). A negative result must be combined with clinical  observations, patient history, and epidemiological information. If result is POSITIVE SARS-CoV-2 target nucleic acids are DETECTED. The SARS-CoV-2 RNA is generally detectable in upper and lower  respiratory specimens dur ing the acute phase of infection.  Positive  results are indicative of active infection with SARS-CoV-2.  Clinical  correlation with patient history and other diagnostic information is  necessary to determine patient infection status.  Positive results do  not rule out bacterial infection or co-infection with other viruses. If result is PRESUMPTIVE POSTIVE SARS-CoV-2 nucleic acids MAY BE PRESENT.   A presumptive positive result was obtained on the submitted specimen  and confirmed on  repeat testing.  While 2019 novel coronavirus  (SARS-CoV-2) nucleic acids may be present in the submitted sample  additional confirmatory testing may be necessary for epidemiological  and / or clinical management purposes  to differentiate between  SARS-CoV-2 and other Sarbecovirus currently known to infect humans.  If clinically indicated additional testing with an alternate test  methodology [phone]) is advised. The SARS-CoV-2 RNA is generally  detectable in upper and lower respiratory sp ecimens during the acute  phase of infection. The expected result is Negative. Fact Sheet for Patients:  StrictlyIdeas.no Fact Sheet for Healthcare Providers: BankingDealers.co.za This test is not yet approved or cleared by the [place] FDA and has been authorized for detection and/or diagnosis of SARS-CoV-2 by FDA under an Emergency Use Authorization (EUA).  This EUA will remain in effect (meaning this test can be used) for the duration of the  COVID-19 declaration under Section 564(b)(1) of the Act, 21 U.S.C. section 360bbb-3(b)(1), unless the authorization is terminated or revoked sooner. Performed at [facility], [place] [address] [place], [place] [address]   Group A Strep by PCR     Status: Abnormal   Collection Time: 07/30/19  6:59 PM   Specimen: Throat; Sterile Swab  Result Value Ref Range   Group A Strep by PCR DETECTED (A) NOT DETECTED    Comment: Performed at [facility], [place] [address], [place], [place] [id number]  Basic metabolic panel     Status: Abnormal   Collection Time: 07/30/19  7:05 PM  Result Value Ref Range   Sodium 135 135 - 145 mmol/L   Potassium 2.8 (L) 3.5 - 5.1 mmol/L    Comment: DELTA CHECK NOTED   Chloride 103 98 - 111 mmol/L   CO2 25 22 - 32 mmol/L   Glucose, Bld 99 70 - 99 mg/dL   BUN 23 (H) 6 - 20 mg/dL   Creatinine, Ser 2.66 (H) 0.44 - 1.00 mg/dL   Calcium 6.3 (LL) 8.9 -  10.3 mg/dL    Comment: CRITICAL RESULT CALLED TO, READ BACK BY AND VERIFIED WITH: RIMANDO, J RN @2016  ON 07/30/2019 JACKSON,K    GFR calc non Af Amer 24 (L) >60 mL/min   GFR calc Af Amer 28 (L) >60 mL/min   Anion gap 7 5 - 15    Comment: Performed at [facility], [place] [address], [place], [place] [address]  Magnesium     Status: None   Collection Time: 07/30/19  7:05 PM  Result Value Ref Range   Magnesium 1.9 1.7 - 2.4 mg/dL    Comment: Performed at [facility], [place] [address], [place], [place] [id number]  Basic metabolic panel     Status: Abnormal   Collection Time: 07/31/19  6:25 AM  Result Value Ref Range   Sodium 134 (L) 135 - 145 mmol/L   Potassium 3.4 (L) 3.5 - 5.1 mmol/L    Comment: DELTA CHECK NOTED REPEATED TO VERIFY SLIGHT HEMOLYSIS    Chloride 105 98 - 111 mmol/L   CO2 19 (L) 22 - 32 mmol/L   Glucose, Bld 99 70 - 99 mg/dL   BUN 23 (H) 6 - 20 mg/dL   Creatinine, Ser 2.52 (H) 0.44 - 1.00 mg/dL   Calcium 6.7 (L) 8.9 - 10.3 mg/dL   GFR calc non Af Amer 26 (L) >60 mL/min   GFR calc Af Amer 30 (L) >60 mL/min   Anion gap 10 5 - 15    Comment: Performed at [facility], [place] [address] [place], [place] [address]    Medications:  Current Facility-Administered Medications  Medication Dose Route Frequency Provider Last Rate Last Dose  . calcium carbonate (TUMS - dosed in mg elemental calcium) chewable tablet 200 mg of elemental calcium  1 tablet Oral BID WC Daleen Bo, MD   200 mg of elemental calcium at 07/31/19 1628  . penicillin v potassium (VEETID) tablet 500 mg  500 mg Oral Q6H Daleen Bo, MD   500 mg at 07/31/19 1150  . phenol (CHLORASEPTIC) mouth spray 1 spray  1 spray Mouth/Throat PRN Daleen Bo, MD       Current Outpatient Medications  Medication Sig Dispense Refill  . ARIPiprazole (ABILIFY MAINTENA IM) Inject 400 mg into the muscle every 30 (thirty) days.    . propranolol (INDERAL) 10 MG tablet  Take 1 tablet (  10 mg total) by mouth 2 (two) times daily. (Patient not taking: Reported on 07/30/2019) 60 tablet 0    Musculoskeletal: Strength & Muscle Tone: within normal limits Gait & Station: normal Patient leans: N/A  Psychiatric Specialty Exam: Physical Exam  Nursing note and vitals reviewed. Constitutional: She is oriented to person, place, and time. She appears well-developed and well-nourished.  HENT:  Head: Normocephalic.  Neck: Normal range of motion.  Cardiovascular: Normal rate.  Respiratory: Effort normal.  Musculoskeletal: Normal range of motion.  Neurological: She is alert and oriented to person, place, and time.  Psychiatric: Her speech is normal and behavior is normal. Cognition and memory are normal. She expresses impulsivity. She exhibits a depressed mood. She expresses suicidal ideation.    Review of Systems  Constitutional: Negative.   HENT: Negative.   Eyes: Negative.   Respiratory: Negative.   Cardiovascular: Negative.   Gastrointestinal: Negative.   Genitourinary: Negative.   Musculoskeletal: Negative.   Skin: Negative.   Neurological: Negative.   Endo/Heme/Allergies: Negative.   Psychiatric/Behavioral: Positive for depression, substance abuse and suicidal ideas. Negative for hallucinations and memory loss. The patient is not nervous/anxious and does not have insomnia.     Blood pressure 136/77, pulse 88, temperature 98 F (36.7 C), temperature source Oral, resp. rate 18, height 5\' 8"  (1.727 m), weight (!) 142.4 kg, SpO2 96 %.Body mass index is 47.73 kg/m.  General Appearance: casual  Eye Contact:  Good  Speech:  Clear and Coherent  Volume:  Normal  Mood:  Depressed  Affect:  Appropriate  Thought Process:  Coherent  Orientation:  Full (Time, Place, and Person)  Thought Content:  Logical  Suicidal Thoughts:  Yes.  with intent/plan  Homicidal Thoughts:  No  Memory:  Immediate;   Good Recent;   Good Remote;   Good  Judgement:  Good  Insight:   Lacking  Psychomotor Activity:  Normal  Concentration:  Concentration: Good and Attention Span: Good  Recall:  Good  Fund of Knowledge:  Fair  Language:  Good  Akathisia:  No  Handed:  Right  AIMS (if indicated):   N/A  Assets:  Desire for Improvement  ADL's:  Intact  Cognition:  WNL  Sleep:   N/A     Treatment Plan Summary: Plan Patient to be observed for safety and stablility overnight.  Disposition: Patient to be observed overnight for reassessment.  This service was provided via telemedicine using a 2-way, interactive audio and video technology.  Names of all persons participating in this telemedicine service and their role in this encounter. Druciella Oletta Lamas patient  Letitia Libra  FNP  Delphia Grates Rankin FNP  Nadyne Coombes  DO    Emmaline Kluver, [place] 07/31/2019 4:33 PM    Patient seen by telemedicine for psychiatric evaluation, chart reviewed and case discussed with the physician extender and developed treatment plan. Reviewed the information documented and agree with the treatment plan.  Buford Dresser, DO 07/31/19 5:27 PM

## 2019-07-31 NOTE — Patient Outreach (Signed)
CPSS met with the patient in order to provide substance use recovery support and help with getting connected to substance use treatment resources. Patient is currently experiencing homelessness and reports a history of daily crack cocaine use. Patient is requesting help with wanting an inpatient psychiatric hospital admission, so the patient can receive dual diagnosis treatment. After CPSS informed the patient that she was psychiatrically cleared, and that she was going to be discharged, the patient continued to endorse SI with a plan to overdose on medications. CPSS informed Shuvon Rankin, NP about the patient's SI with a plan, and the patient now has been recommended for an inpatient psychiatric hospital admission at this time. CPSS informed the patient about this disposition change, and CPSS provided information for substance use recovery resources. Some of these resources include a residential/outpatient substance use treatment center list, NA meeting list, [facility] list for [facility], and CPSS contact information. CPSS strongly encouraged the patient to continue to stay in contact with CPSS for further substance use recovery support or for any additonal questions/help with getting connected to substance use recovery resources.

## 2019-07-31 NOTE — ED Notes (Signed)
Spoke with [employer] NP, informs nurse that pt will not be discharged today. Personal property removed from pt room.

## 2019-07-31 NOTE — BH Assessment (Signed)
[facility] Assessment Progress Note  Per Buford Dresser, DO, this pt does not require psychiatric hospitalization at this time.  Pt is to be discharged from [facility].  Discharge instructions include referral information for [facility], pt's current outpatient provider, as well as contact information for area substance abuse treatment providers.  Pt would also benefit from seeing Peer Support Specialists; they will be asked to speak to pt.  [name] nurse, Caryl Pina, has been notified.  Jalene Mullet, [place] Triage Specialist [phone]

## 2019-07-31 NOTE — ED Notes (Signed)
Peer Support is at bedside. Attempted to discontinue IV, but will not allow this writer to take IV out. Belongings have been given back to patient for discharge.

## 2019-08-14 ENCOUNTER — Other Ambulatory Visit: Payer: Self-pay

## 2019-08-14 ENCOUNTER — Encounter (HOSPITAL_COMMUNITY): Payer: Self-pay | Admitting: Emergency Medicine

## 2019-08-14 ENCOUNTER — Inpatient Hospital Stay (HOSPITAL_COMMUNITY)
Admission: EM | Admit: 2019-08-14 | Discharge: 2019-08-20 | DRG: 683 | Disposition: A | Discharge status: Home / Self Care | Payer: Medicaid Other | Attending: Internal Medicine | Admitting: Internal Medicine

## 2019-08-14 ENCOUNTER — Emergency Department (HOSPITAL_COMMUNITY): Payer: Medicaid Other

## 2019-08-14 DIAGNOSIS — Z888 Allergy status to other drugs, medicaments and biological substances status: Secondary | ICD-10-CM | POA: Diagnosis not present

## 2019-08-14 DIAGNOSIS — R109 Unspecified abdominal pain: Secondary | ICD-10-CM

## 2019-08-14 DIAGNOSIS — Z20828 Contact with and (suspected) exposure to other viral communicable diseases: Secondary | ICD-10-CM | POA: Diagnosis present

## 2019-08-14 DIAGNOSIS — F1210 Cannabis abuse, uncomplicated: Secondary | ICD-10-CM | POA: Diagnosis present

## 2019-08-14 DIAGNOSIS — N049 Nephrotic syndrome with unspecified morphologic changes: Secondary | ICD-10-CM | POA: Diagnosis not present

## 2019-08-14 DIAGNOSIS — E8770 Fluid overload, unspecified: Secondary | ICD-10-CM | POA: Diagnosis present

## 2019-08-14 DIAGNOSIS — I129 Hypertensive chronic kidney disease with stage 1 through stage 4 chronic kidney disease, or unspecified chronic kidney disease: Secondary | ICD-10-CM | POA: Diagnosis present

## 2019-08-14 DIAGNOSIS — N179 Acute kidney failure, unspecified: Principal | ICD-10-CM | POA: Diagnosis present

## 2019-08-14 DIAGNOSIS — F603 Borderline personality disorder: Secondary | ICD-10-CM | POA: Diagnosis present

## 2019-08-14 DIAGNOSIS — N189 Chronic kidney disease, unspecified: Secondary | ICD-10-CM | POA: Diagnosis present

## 2019-08-14 DIAGNOSIS — F4310 Post-traumatic stress disorder, unspecified: Secondary | ICD-10-CM | POA: Diagnosis present

## 2019-08-14 DIAGNOSIS — Z79899 Other long term (current) drug therapy: Secondary | ICD-10-CM

## 2019-08-14 DIAGNOSIS — F17290 Nicotine dependence, other tobacco product, uncomplicated: Secondary | ICD-10-CM | POA: Diagnosis present

## 2019-08-14 DIAGNOSIS — N3000 Acute cystitis without hematuria: Secondary | ICD-10-CM | POA: Diagnosis not present

## 2019-08-14 DIAGNOSIS — Z6841 Body Mass Index (BMI) 40.0 and over, adult: Secondary | ICD-10-CM

## 2019-08-14 DIAGNOSIS — F3131 Bipolar disorder, current episode depressed, mild: Secondary | ICD-10-CM | POA: Diagnosis not present

## 2019-08-14 DIAGNOSIS — E669 Obesity, unspecified: Secondary | ICD-10-CM | POA: Diagnosis present

## 2019-08-14 DIAGNOSIS — G629 Polyneuropathy, unspecified: Secondary | ICD-10-CM | POA: Diagnosis present

## 2019-08-14 DIAGNOSIS — R188 Other ascites: Secondary | ICD-10-CM | POA: Diagnosis present

## 2019-08-14 DIAGNOSIS — F1410 Cocaine abuse, uncomplicated: Secondary | ICD-10-CM | POA: Diagnosis present

## 2019-08-14 DIAGNOSIS — F319 Bipolar disorder, unspecified: Secondary | ICD-10-CM | POA: Diagnosis present

## 2019-08-14 DIAGNOSIS — E876 Hypokalemia: Secondary | ICD-10-CM | POA: Diagnosis present

## 2019-08-14 DIAGNOSIS — I10 Essential (primary) hypertension: Secondary | ICD-10-CM | POA: Diagnosis present

## 2019-08-14 LAB — URINALYSIS, ROUTINE W REFLEX MICROSCOPIC
Bilirubin Urine: NEGATIVE
Glucose, UA: 150 mg/dL — AB
Hgb urine dipstick: NEGATIVE
Ketones, ur: NEGATIVE mg/dL
Leukocytes,Ua: NEGATIVE
Nitrite: NEGATIVE
Protein, ur: >=300 mg/dL — AB
Specific Gravity, Urine: 1.032 — ABNORMAL HIGH (ref 1.005–1.030)
pH: 6 (ref 5.0–8.0)

## 2019-08-14 LAB — I-STAT BETA HCG BLOOD, ED (MC, WL, AP ONLY): I-stat hCG, quantitative: <5 m[IU]/mL (ref <5)

## 2019-08-14 LAB — LIPASE, BLOOD: Lipase: 75 U/L — ABNORMAL HIGH (ref 11–51)

## 2019-08-14 LAB — CBC
HCT: 43.5 % (ref 36.0–46.0)
Hemoglobin: 14 g/dL (ref 12.0–15.0)
MCH: 29.5 pg (ref 26.0–34.0)
MCHC: 32.2 g/dL (ref 30.0–36.0)
MCV: 91.8 fL (ref 80.0–100.0)
Platelets: 270 10*3/uL (ref 150–400)
RBC: 4.74 MIL/uL (ref 3.87–5.11)
RDW: 13.7 % (ref 11.5–15.5)
WBC: 12.9 10*3/uL — ABNORMAL HIGH (ref 4.0–10.5)
nRBC: 0 % (ref 0.0–0.2)

## 2019-08-14 LAB — COMPREHENSIVE METABOLIC PANEL
ALT: 25 U/L (ref 0–44)
AST: 34 U/L (ref 15–41)
Albumin: 1.2 g/dL — ABNORMAL LOW (ref 3.5–5.0)
Alkaline Phosphatase: 59 U/L (ref 38–126)
Anion gap: 9 (ref 5–15)
BUN: 39 mg/dL — ABNORMAL HIGH (ref 6–20)
CO2: 23 mmol/L (ref 22–32)
Calcium: 7.9 mg/dL — ABNORMAL LOW (ref 8.9–10.3)
Chloride: 106 mmol/L (ref 98–111)
Creatinine, Ser: 4.97 mg/dL — ABNORMAL HIGH (ref 0.44–1.00)
GFR calc Af Amer: 13 mL/min — ABNORMAL LOW (ref >60)
GFR calc non Af Amer: 11 mL/min — ABNORMAL LOW (ref >60)
Glucose, Bld: 94 mg/dL (ref 70–99)
Potassium: 4.2 mmol/L (ref 3.5–5.1)
Sodium: 138 mmol/L (ref 135–145)
Total Bilirubin: 0.2 mg/dL — ABNORMAL LOW (ref 0.3–1.2)
Total Protein: 4.8 g/dL — ABNORMAL LOW (ref 6.5–8.1)

## 2019-08-14 MED ORDER — ONDANSETRON HCL 4 MG PO TABS
4.0000 mg | ORAL_TABLET | Freq: Four times a day (QID) | ORAL | Status: DC | PRN
  Administered 2019-08-15: 4 mg via ORAL
  Filled 2019-08-14: qty 1

## 2019-08-14 MED ORDER — ONDANSETRON HCL 4 MG/2ML IJ SOLN
4.0000 mg | Freq: Four times a day (QID) | INTRAMUSCULAR | Status: DC | PRN
  Filled 2019-08-14: qty 2

## 2019-08-14 MED ORDER — SODIUM CHLORIDE 0.9 % IV SOLN
1.0000 g | Freq: Once | INTRAVENOUS | Status: AC
  Administered 2019-08-14: 1 g via INTRAVENOUS
  Filled 2019-08-14: qty 10

## 2019-08-14 MED ORDER — ACETAMINOPHEN 650 MG RE SUPP: 650.0000 mg | Freq: Four times a day (QID) | RECTAL | Status: DC | PRN

## 2019-08-14 MED ORDER — DULOXETINE HCL 20 MG PO CPEP
20.0000 mg | ORAL_CAPSULE | Freq: Every day | ORAL | Status: DC
  Administered 2019-08-15 – 2019-08-20 (×7): 20 mg via ORAL
  Filled 2019-08-14 (×8): qty 1

## 2019-08-14 MED ORDER — ACETAMINOPHEN 325 MG PO TABS
650.0000 mg | ORAL_TABLET | Freq: Four times a day (QID) | ORAL | Status: DC | PRN
  Administered 2019-08-15: 650 mg via ORAL
  Filled 2019-08-14: qty 2

## 2019-08-14 MED ORDER — GABAPENTIN 300 MG PO CAPS
300.0000 mg | ORAL_CAPSULE | Freq: Two times a day (BID) | ORAL | Status: DC
  Administered 2019-08-15 – 2019-08-20 (×12): 300 mg via ORAL
  Filled 2019-08-14 (×12): qty 1

## 2019-08-14 NOTE — H&P (Signed)
History and Physical    Natasha Long [id number] DOB: 1996/01/30 DOA: 08/14/2019  PCP: Patient, No Pcp Per  Patient coming from: Home  I have personally briefly reviewed patient's old medical records in [place]  Chief Complaint: Flank pain  HPI: Natasha Long is a 23 y.o. female with medical history significant of BPD, cocaine abuse, nephrotic syndrome.  Patient started having pain in R flank and upper abdomen over past week.  Had some episodes of N/V as well as diarrhea.  N/V improved but continues to have loose stools.  Earlier this month patient was admitted for suicidal ideations.  Noted to have AKI at that time with creat of 3.0 which improved to 2.5 in ED with IVF before she was ultimately admitted to psych.  She also had strep throat that was treated as well at that time it seems with pen vk.  Sore throat has since resolved she reports.  Denies fever.   ED Course: Creat 4.97, BUN 39.  Albumin 1.2 down from 2.0 earlier this month and 3.4 in June.  UA with neg LE, neg nitrites, 21-50 WBC, 6-10 RBC.  >300 protein.   Review of Systems: As per HPI, otherwise all review of systems negative.  Past Medical History:  Diagnosis Date  . Bipolar affective, manic ([place])   . Hypertension    diagnosed as child; stopped meds at 17 yo  . Hypoglycemia   . Insomnia   . Nephrotic syndrome   . Nephrotic syndrome   . PTSD (post-traumatic stress disorder)   . Renal disease     Past Surgical History:  Procedure Laterality Date  . CHOLECYSTECTOMY    . extraction of wisdom teeth    . RENAL BIOPSY       reports that she has been smoking e-cigarettes. She has been smoking about 0.50 packs per day. She has never used smokeless tobacco. She reports current drug use. Drugs: Marijuana and Cocaine. She reports that she does not drink alcohol.  Allergies  Allergen Reactions  . Prednisone Other (See Comments)    Pt states that this med caused pancreatitis.   . Prozac  [Fluoxetine Hcl] Other (See Comments)    Panic attack   . Wellbutrin [Bupropion] Other (See Comments)    Panic attack    Family History  Problem Relation Age of Onset  . Diabetes Other   . Hypertension Other      Prior to Admission medications   Medication Sig Start Date End Date Taking? Authorizing Provider  ARIPiprazole (ABILIFY MAINTENA IM) Inject 400 mg into the muscle every 30 (thirty) days.   Yes [provider]  DULoxetine (CYMBALTA) 20 MG capsule Take 20 mg by mouth daily.   Yes [provider]  gabapentin (NEURONTIN) 800 MG tablet Take 800 mg by mouth 3 (three) times daily.   Yes [provider]    Physical Exam: Vitals:   08/14/19 1752 08/14/19 1843 08/14/19 2125  BP: (!) 136/115 (!) 150/110 (!) 143/99  Pulse: 95 (!) 102 92  Resp: 18 15 20   Temp: 99.4 F (37.4 C)  97.8 F (36.6 C)  TempSrc: Oral  Oral  SpO2: 100% 100% 100%  Weight: (!) 154.7 kg    Height: 5\' 8"  (1.727 m)      Constitutional: NAD, calm, comfortable Eyes: PERRL, lids and conjunctivae normal ENMT: Mucous membranes are moist. Posterior pharynx clear of any exudate or lesions.Normal dentition.  Neck: normal, supple, no masses, no thyromegaly Respiratory: clear to auscultation bilaterally, no  wheezing, no crackles. Normal respiratory effort. No accessory muscle use.  Cardiovascular: Regular rate and rhythm, no murmurs / rubs / gallops. No extremity edema. 2+ pedal pulses. No carotid bruits.  Abdomen: no tenderness, no masses palpated. No hepatosplenomegaly. Bowel sounds positive.  Musculoskeletal: no clubbing / cyanosis. No joint deformity upper and lower extremities. Good ROM, no contractures. Normal muscle tone.  Skin: no rashes, lesions, ulcers. No induration Neurologic: CN 2-12 grossly intact. Sensation intact, DTR normal. Strength 5/5 in all 4.  Psychiatric: Normal judgment and insight. Alert and oriented x 3. Normal mood.    Labs on Admission: I have personally  reviewed following labs and imaging studies  CBC: Recent Labs  Lab 08/14/19 1752  WBC 12.9*  HGB 14.0  HCT 43.5  MCV 91.8  PLT [id number]   Basic Metabolic Panel: Recent Labs  Lab 08/14/19 1859  NA 138  K 4.2  CL 106  CO2 23  GLUCOSE 94  BUN 39*  CREATININE 4.97*  CALCIUM 7.9*   GFR: Estimated Creatinine Clearance: 27.4 mL/min (A) (by C-G formula based on SCr of 4.97 mg/dL (H)). Liver Function Tests: Recent Labs  Lab 08/14/19 1859  AST 34  ALT 25  ALKPHOS 59  BILITOT 0.2*  PROT 4.8*  ALBUMIN 1.2*   Recent Labs  Lab 08/14/19 1859  LIPASE 75*   No results for input(s): AMMONIA in the last 168 hours. Coagulation Profile: No results for input(s): INR, PROTIME in the last 168 hours. Cardiac Enzymes: No results for input(s): CKTOTAL, CKMB, CKMBINDEX, TROPONINI in the last 168 hours. BNP (last 3 results) No results for input(s): PROBNP in the last 8760 hours. HbA1C: No results for input(s): HGBA1C in the last 72 hours. CBG: No results for input(s): GLUCAP in the last 168 hours. Lipid Profile: No results for input(s): CHOL, HDL, LDLCALC, TRIG, CHOLHDL, LDLDIRECT in the last 72 hours. Thyroid Function Tests: No results for input(s): TSH, T4TOTAL, FREET4, T3FREE, THYROIDAB in the last 72 hours. Anemia Panel: No results for input(s): VITAMINB12, FOLATE, FERRITIN, TIBC, IRON, RETICCTPCT in the last 72 hours. Urine analysis:    Component Value Date/Time   COLORURINE YELLOW 08/14/2019 1752   APPEARANCEUR HAZY (A) 08/14/2019 1752   LABSPEC 1.032 (H) 08/14/2019 1752   PHURINE 6.0 08/14/2019 1752   GLUCOSEU 150 (A) 08/14/2019 1752   HGBUR NEGATIVE 08/14/2019 1752   BILIRUBINUR NEGATIVE 08/14/2019 1752   BILIRUBINUR neg 09/15/2017 [place] 08/14/2019 1752   PROTEINUR >=300 (A) 08/14/2019 1752   UROBILINOGEN 0.2 09/15/2017 1657   NITRITE NEGATIVE 08/14/2019 1752   LEUKOCYTESUR NEGATIVE 08/14/2019 1752    Radiological Exams on Admission: No results  found.  EKG: Independently reviewed.  Assessment/Plan Principal Problem:   Acute kidney failure (HCC) Active Problems:   Cocaine abuse (HCC)   Nephrotic syndrome    1. AKF - 1. Concern for worsening nephrotic syndrome. 1. ? Post strep GN 2. Levamisole renal disease from ongoing cocaine abuse? 2. Doesn't seem to be pre-renal based on BUN/CREAT ratios. 3. Also looks like worsening pre-dates her N/V/D as it was already worsening at end of last month. 4. UCx pending, got rocephin in ED but doubt UTI 5. Renal US pending 6. Needs nephrology consult in AM 7. Will reduce neurontin to renal dosing (300 BID) for now.  DVT prophylaxis: SCDs Code Status: Full Family Communication: No family in room Disposition Plan: Home after admit Consults called: None, call nephro in AM Admission status: Admit to inpatient  Severity of Illness:  The appropriate patient status for this patient is INPATIENT. Inpatient status is judged to be reasonable and necessary in order to provide the required intensity of service to ensure the patient's safety. The patient's presenting symptoms, physical exam findings, and initial radiographic and laboratory data in the context of their chronic comorbidities is felt to place them at high risk for further clinical deterioration. Furthermore, it is not anticipated that the patient will be medically stable for discharge from the hospital within 2 midnights of admission. The following factors support the patient status of inpatient.   IP status for AKF with creat of 5 up from 1 baseline.   * I certify that at the point of admission it is my clinical judgment that the patient will require inpatient hospital care spanning beyond 2 midnights from the point of admission due to high intensity of service, high risk for further deterioration and high frequency of surveillance required.*    Dany Walther M. DO [facility]  How to contact the [facility] Attending or  Consulting provider [place] or covering provider during after hours [place], for this patient?  1. Check the care team in [facility] and look for a) attending/consulting [facility] provider listed and b) the [facility] team listed 2. Log into [email or web address]  Amion Physician Scheduling and messaging for groups and whole hospitals  On call and physician scheduling software for group practices, residents, hospitalists and other medical providers for call, clinic, rotation and shift schedules. On[employer] is a hospital-wide system for scheduling doctors and paging doctors on call. EasyPlot is for scientific plotting and data analysis.  [email or web address]  and use [place]'s universal password to access. If you do not have the password, please contact the hospital operator.  3. Locate the [facility] provider you are looking for under [facility] and page to a number that you can be directly reached. 4. If you still have difficulty reaching the provider, please page the [facility] (Director on Call) for the Hospitalists listed on amion for assistance.  08/14/2019, 9:56 PM

## 2019-08-14 NOTE — ED Notes (Signed)
Provided patient a ham sandwich, 2 cheese sticks, and ginger ale.

## 2019-08-14 NOTE — ED Notes (Signed)
Report attempted at this time.

## 2019-08-14 NOTE — ED Notes (Signed)
Provided patient ginger ale.

## 2019-08-14 NOTE — ED Triage Notes (Signed)
Pt complaint of generalized swelling and flank pain for a week; pt verbalizes hx of same with abnormal kidney function.

## 2019-08-14 NOTE — ED Notes (Signed)
ED TO INPATIENT HANDOFF REPORT  ED Nurse Name and Phone #:  Heywood Bene    Name/Age/Gender Natasha Long 23 y.o. female Room/Bed: WA24/WA24  Code Status   Code Status: Full Code  Home/SNF/Other Home Patient oriented to: self, place, time and situation Is this baseline? Yes   Triage Complete: Triage complete  Chief Complaint facial/feet/abd swelling; fatique; frequent urination  Triage Note Pt complaint of generalized swelling and flank pain for a week; pt verbalizes hx of same with abnormal kidney function.   Allergies Allergies  Allergen Reactions  . Prednisone Other (See Comments)    Pt states that this med caused pancreatitis.   . Prozac [Fluoxetine Hcl] Other (See Comments)    Panic attack   . Wellbutrin [Bupropion] Other (See Comments)    Panic attack    Level of Care/Admitting Diagnosis ED Disposition    ED Disposition Condition Comment   Admit  Hospital Area: [place] [[id number]]  Level of Care: Med-Surg [16]  Covid Evaluation: Asymptomatic Screening Protocol (No Symptoms)  [facility]: [id number]  Admitting Physician: Doreatha Massed  Attending Physician: Etta Quill [phone]  Estimated length of stay: past midnight tomorrow  Certification:: I certify this patient will need inpatient services for at least 2 midnights  PT Class (Do Not Modify): Inpatient [101]  PT Acc Code (Do Not Modify): Private [1]       B Medical/Surgery History Past Medical History:  Diagnosis Date  . Bipolar affective, manic ([place])   . Hypertension    diagnosed as child; stopped meds at 24 yo  . Hypoglycemia   . Insomnia   . Nephrotic syndrome   . Nephrotic syndrome   . PTSD (post-traumatic stress disorder)   . Renal disease    Past Surgical History:  Procedure Laterality Date  . CHOLECYSTECTOMY    . extraction of wisdom teeth    . RENAL BIOPSY       A IV Location/Drains/Wounds Patient  Lines/Drains/Airways Status   Active Line/Drains/Airways    Name:   Placement date:   Placement time:   Site:   Days:   Peripheral IV 08/14/19 Left Antecubital   08/14/19    2124    Antecubital   less than 1          Intake/Output Last 24 hours No intake or output data in the 24 hours ending 08/14/19 2343  Labs/Imaging Results for orders placed or performed during the hospital encounter of 08/14/19 (from the past 48 hour(s))  Urinalysis, Routine w reflex microscopic- may I&O cath if menses     Status: Abnormal   Collection Time: 08/14/19  5:52 PM  Result Value Ref Range   Color, Urine YELLOW YELLOW   APPearance HAZY (A) CLEAR   Specific Gravity, Urine 1.032 (H) 1.005 - 1.030   pH 6.0 5.0 - 8.0   Glucose, UA 150 (A) NEGATIVE mg/dL   Hgb urine dipstick NEGATIVE NEGATIVE   Bilirubin Urine NEGATIVE NEGATIVE   Ketones, ur NEGATIVE NEGATIVE mg/dL   Protein, ur >=300 (A) NEGATIVE mg/dL   Nitrite NEGATIVE NEGATIVE   Leukocytes,Ua NEGATIVE NEGATIVE   RBC / HPF 6-10 0 - 5 RBC/hpf   WBC, UA 21-50 0 - 5 WBC/hpf   Bacteria, UA RARE (A) NONE SEEN   Squamous Epithelial / LPF 6-10 0 - 5   Mucus PRESENT    Hyaline Casts, UA PRESENT    Non Squamous Epithelial 0-5 (A) NONE SEEN    Comment:  Performed at [facility], [place] [address] [place], [place] [address]  CBC     Status: Abnormal   Collection Time: 08/14/19  5:52 PM  Result Value Ref Range   WBC 12.9 (H) 4.0 - 10.5 K/uL   RBC 4.74 3.87 - 5.11 MIL/uL   Hemoglobin 14.0 12.0 - 15.0 g/dL   HCT 43.5 36.0 - 46.0 %   MCV 91.8 80.0 - 100.0 fL   MCH 29.5 26.0 - 34.0 pg   MCHC 32.2 30.0 - 36.0 g/dL   RDW 13.7 11.5 - 15.5 %   Platelets 270 150 - 400 K/uL   nRBC 0.0 0.0 - 0.2 %    Comment: Performed at [facility], [place] [address], [place], [place] [address]  Comprehensive metabolic panel     Status: Abnormal   Collection Time: 08/14/19  6:59 PM  Result Value Ref Range   Sodium 138 135 - 145 mmol/L    Potassium 4.2 3.5 - 5.1 mmol/L   Chloride 106 98 - 111 mmol/L   CO2 23 22 - 32 mmol/L   Glucose, Bld 94 70 - 99 mg/dL   BUN 39 (H) 6 - 20 mg/dL   Creatinine, Ser 4.97 (H) 0.44 - 1.00 mg/dL   Calcium 7.9 (L) 8.9 - 10.3 mg/dL   Total Protein 4.8 (L) 6.5 - 8.1 g/dL   Albumin 1.2 (L) 3.5 - 5.0 g/dL   AST 34 15 - 41 U/L   ALT 25 0 - 44 U/L   Alkaline Phosphatase 59 38 - 126 U/L   Total Bilirubin 0.2 (L) 0.3 - 1.2 mg/dL   GFR calc non Af Amer 11 (L) >60 mL/min   GFR calc Af Amer 13 (L) >60 mL/min   Anion gap 9 5 - 15    Comment: Performed at [facility], [place] [address], [place], [place] [address]  Lipase, blood     Status: Abnormal   Collection Time: 08/14/19  6:59 PM  Result Value Ref Range   Lipase 75 (H) 11 - 51 U/L    Comment: Performed at [facility], [place] [address], [place], [place] [address]  I-Stat beta hCG blood, ED     Status: None   Collection Time: 08/14/19  7:36 PM  Result Value Ref Range   I-stat hCG, quantitative <5.0 <5 mIU/mL   Comment 3            Comment:   GEST. AGE      CONC.  (mIU/mL)   <=1 WEEK        5 - 50     2 WEEKS       50 - 500     3 WEEKS       100 - 10,000     4 WEEKS     1,000 - 30,000        FEMALE AND NON-PREGNANT FEMALE:     LESS THAN 5 mIU/mL    US Renal  Result Date: 08/14/2019 CLINICAL DATA:  Elevated creatinine EXAM: RENAL / URINARY TRACT ULTRASOUND COMPLETE COMPARISON:  None. FINDINGS: Right Kidney: Renal measurements: 6.0 x 5.6 cm. = volume: 193 mL . Echogenicity within normal limits. No mass or hydronephrosis visualized. Left Kidney: Renal measurements: 12.5 x 5.6 x 6.8 cm. = volume: 248 mL. Echogenicity within normal limits. No mass or hydronephrosis visualized. Bladder: Appears normal for degree of bladder distention. Incidental note is made of mild ascites as well as a small  right-sided pleural effusion. IMPRESSION: Normal-appearing kidneys. Mild ascites and small right pleural effusion.  Electronically Signed   By: Inez Catalina M.D.   On: 08/14/2019 21:55    Pending Labs Unresulted Labs (From admission, onward)    Start     Ordered   08/15/19 0500  CBC  Tomorrow morning,   R     08/14/19 2148   08/15/19 [id number]  Basic metabolic panel  Tomorrow morning,   R     08/14/19 2148   08/14/19 2125  SARS CORONAVIRUS 2 (TAT 6-24 HRS) Nasopharyngeal Nasopharyngeal Swab  (Asymptomatic/Tier 2 Patients Labs)  Once,   STAT    Question Answer Comment  Is this test for diagnosis or screening Screening   Symptomatic for COVID-19 as defined by CDC No   Hospitalized for COVID-19 No   Admitted to ICU for COVID-19 No   Previously tested for COVID-19 Yes   Resident in a congregate (group) care setting No   Employed in healthcare setting No   Pregnant No      08/14/19 2124   08/14/19 1752  Urine C&S  Once,   STAT     08/14/19 1751          Vitals/Pain Today's Vitals   08/14/19 1750 08/14/19 1752 08/14/19 1843 08/14/19 2125  BP:  (!) 136/115 (!) 150/110 (!) 143/99  Pulse:  95 (!) 102 92  Resp:  18 15 20   Temp:  99.4 F (37.4 C)  97.8 F (36.6 C)  TempSrc:  Oral  Oral  SpO2:  100% 100% 100%  Weight:  (!) 154.7 kg    Height:  5\' 8"  (1.727 m)    PainSc: 9        Isolation Precautions No active isolations  Medications Medications  DULoxetine (CYMBALTA) DR capsule 20 mg (has no administration in time range)  gabapentin (NEURONTIN) capsule 300 mg (has no administration in time range)  acetaminophen (TYLENOL) tablet 650 mg (has no administration in time range)    Or  acetaminophen (TYLENOL) suppository 650 mg (has no administration in time range)  ondansetron (ZOFRAN) tablet 4 mg (has no administration in time range)    Or  ondansetron (ZOFRAN) injection 4 mg (has no administration in time range)  cefTRIAXone (ROCEPHIN) 1 g in sodium chloride 0.9 % 100 mL IVPB (0 g Intravenous Stopped 08/14/19 2158)    Mobility walks Low fall risk   Focused Assessments Cardiac  Assessment Handoff:    Lab Results  Component Value Date   CKTOTAL 85 07/30/2019   No results found for: DDIMER Does the Patient currently have chest pain? No  , Neuro Assessment Handoff:  Swallow screen pass? n/a         Neuro Assessment:   Neuro Checks:      Last Documented NIHSS Modified Score:   Has TPA been given? No If patient is a Neuro Trauma and patient is going to OR before floor call report to [place] nurse: [phone] or [phone]  Recommendations: See Admitting Provider Note  Report given to:   Additional Notes:

## 2019-08-14 NOTE — ED Provider Notes (Signed)
[facility] DEPT Provider Note   CSN: [id number] Arrival date & time: 08/14/19  1647     History   Chief Complaint Chief Complaint  Patient presents with  . Flank Pain    HPI Natasha Long is a 23 y.o. female.     HPI Patient presents to the emergency room with complaints of flank pain.  Patient states she started having pain in her right flank and upper abdomen over the past week.  It is a sharp pain as well as a tightness.  Patient states she has had some episodes of nausea and vomiting as well as diarrhea.  She denies any nausea vomiting in the last day or 2 but she continues to have some loose stools.  Patient denies any dysuria.  She denies any fevers or chills.  She still has an appetite and is thirsty.  Patient states she has a history of kidney disease and was concerned about this so that is why she came back into the ED to be evaluated. Past Medical History:  Diagnosis Date  . Bipolar affective, manic ([place])   . Hypertension    diagnosed as child; stopped meds at 7 yo  . Hypoglycemia   . Insomnia   . Nephrotic syndrome   . Nephrotic syndrome   . PTSD (post-traumatic stress disorder)   . Renal disease     Patient Active Problem List   Diagnosis Date Noted  . Affective psychosis, bipolar ([place]) 06/27/2019  . Closed displaced fracture of shaft of fifth metacarpal bone of left hand 02/28/2019  . MDD (major depressive disorder), recurrent severe, without psychosis ([place]) 02/08/2019  . MDD (major depressive disorder), severe ([place]) 01/13/2019  . Borderline personality disorder ([place]) 11/16/2018  . Moderate cannabis use disorder ([place]) 11/16/2018  . Severe recurrent major depression without psychotic features ([place]) 11/15/2018  . 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 Chronic hypertension 04/04/2018  . Nephrotic syndrome 04/01/2018  . Supervision of high risk pregnancy, antepartum 04/01/2018  . Rh negative status during  pregnancy 04/01/2018  . PTSD (post-traumatic stress disorder) 04/01/2018  . Bipolar affective disorder, depressed, mild ([place]) 04/09/2017  . Cocaine abuse ([place]) 04/09/2017    Past Surgical History:  Procedure Laterality Date  . CHOLECYSTECTOMY    . extraction of wisdom teeth    . RENAL BIOPSY       OB History    Gravida  2   Para  1   Term  1   Preterm      AB      Living  1     SAB      TAB      Ectopic      Multiple      Live Births  1            Home Medications    Prior to Admission medications   Medication Sig Start Date End Date Taking? Authorizing Provider  ARIPiprazole (ABILIFY MAINTENA IM) Inject 400 mg into the muscle every 30 (thirty) days.   Yes [provider]  DULoxetine (CYMBALTA) 20 MG capsule Take 20 mg by mouth daily.   Yes [provider]  gabapentin (NEURONTIN) 800 MG tablet Take 800 mg by mouth 3 (three) times daily.   Yes [provider]    Family History Family History  Problem Relation Age of Onset  . Diabetes Other   . Hypertension Other     Social History Social History   Tobacco Use  .  Smoking status: Current Every Day Smoker    Packs/day: 0.50    Types: E-cigarettes  . Smokeless tobacco: Never Used  Substance Use Topics  . Alcohol use: No  . Drug use: Yes    Types: Marijuana, Cocaine     Allergies   Prednisone, Prozac [fluoxetine hcl], and Wellbutrin [bupropion]   Review of Systems Review of Systems  All other systems reviewed and are negative.    Physical Exam Updated Vital Signs BP (!) 150/110   Pulse (!) 102   Temp 99.4 F (37.4 C) (Oral)   Resp 15   Ht 1.727 m (5\' 8" )   Wt (!) 154.7 kg   LMP 08/08/2019   SpO2 100%   BMI 51.85 kg/m   Physical Exam Vitals signs and nursing note reviewed.  Constitutional:      General: She is not in acute distress.    Appearance: She is well-developed. She is obese.  HENT:     Head: Normocephalic and atraumatic.     Right  Ear: External ear normal.     Left Ear: External ear normal.  Eyes:     General: No scleral icterus.       Right eye: No discharge.        Left eye: No discharge.     Conjunctiva/sclera: Conjunctivae normal.  Neck:     Musculoskeletal: Neck supple.     Trachea: No tracheal deviation.  Cardiovascular:     Rate and Rhythm: Normal rate and regular rhythm.  Pulmonary:     Effort: Pulmonary effort is normal. No respiratory distress.     Breath sounds: Normal breath sounds. No stridor. No wheezing or rales.  Abdominal:     General: Bowel sounds are normal. There is no distension.     Palpations: Abdomen is soft.     Tenderness: There is no abdominal tenderness. There is no guarding or rebound.  Musculoskeletal:        General: No tenderness.  Skin:    General: Skin is warm and dry.     Findings: No rash.  Neurological:     Mental Status: She is alert.     Cranial Nerves: No cranial nerve deficit (no facial droop, extraocular movements intact, no slurred speech).     Sensory: No sensory deficit.     Motor: No abnormal muscle tone or seizure activity.     Coordination: Coordination normal.      ED Treatments / Results  Labs (all labs ordered are listed, but only abnormal results are displayed) Labs Reviewed  URINALYSIS, ROUTINE W REFLEX MICROSCOPIC - Abnormal; Notable for the following components:      Result Value   APPearance HAZY (*)    Specific Gravity, Urine 1.032 (*)    Glucose, UA 150 (*)    Protein, ur >=300 (*)    Bacteria, UA RARE (*)    Non Squamous Epithelial 0-5 (*)    All other components within normal limits  CBC - Abnormal; Notable for the following components:   WBC 12.9 (*)    All other components within normal limits  COMPREHENSIVE METABOLIC PANEL - Abnormal; Notable for the following components:   BUN 39 (*)    Creatinine, Ser 4.97 (*)    Calcium 7.9 (*)    Total Protein 4.8 (*)    Albumin 1.2 (*)    Total Bilirubin 0.2 (*)    GFR calc non Af Amer  11 (*)    GFR calc Af Amer 13 (*)  All other components within normal limits  LIPASE, BLOOD - Abnormal; Notable for the following components:   Lipase 75 (*)    All other components within normal limits  URINE CULTURE  SARS CORONAVIRUS 2 (TAT 6-24 HRS)  I-STAT BETA HCG BLOOD, ED ([facility], [facility], [facility] ONLY)    EKG None  Radiology No results found.  Procedures Procedures (including critical care time)  Medications Ordered in ED Medications  cefTRIAXone (ROCEPHIN) 1 g in sodium chloride 0.9 % 100 mL IVPB (has no administration in time range)     Initial Impression / Assessment and Plan / ED Course  I have reviewed the triage vital signs and the nursing notes.  Pertinent labs & imaging results that were available during my care of the patient were reviewed by me and considered in my medical decision making (see chart for details).  Clinical Course as of Aug 13 2124  Mon Aug 14, 2019  2052 Lipase slightly elevated.  Similar to previous.   [JK]  2052 White blood cell count is elevated 12.9 but this is similar to previous values.   [JK]  2053 Creatinine is significantly elevated compared to previous values.   [JK]  2053 Significant proteinuria noted.  Urinalysis suggestive of possible infection   [JK]    Clinical Course User Index [JK] Dorie Rank, MD     presented with abdominal pain.  She recently had nausea and vomiting all that has resolved and she has an appetite now.  Labs are notable for significant increase in her creatinine.  Patient's urinalysis also shows significant proteinuria and urinalysis suggestive of possible UTI.  Patient's abdominal exam is benign.  Doubt acute obstruction appendicitis, cholecystitis pancreatitis.  I am concerned about her bump in creatinine.  Possible this could be prerenal but she does have significant proteinuria concerning for possible nephrotic syndrome.  IV hydration has been ordered.  I will order renal ultrasound.  I will consult the medical  service for admission and further treatment.  Final Clinical Impressions(s) / ED Diagnoses   Final diagnoses:  Flank pain  AKI (acute kidney injury) ([place])  Acute cystitis without hematuria     Dorie Rank, MD 08/14/19 2126

## 2019-08-15 ENCOUNTER — Encounter (HOSPITAL_COMMUNITY): Payer: Self-pay

## 2019-08-15 DIAGNOSIS — N179 Acute kidney failure, unspecified: Secondary | ICD-10-CM

## 2019-08-15 DIAGNOSIS — N3000 Acute cystitis without hematuria: Secondary | ICD-10-CM

## 2019-08-15 LAB — BASIC METABOLIC PANEL
Anion gap: 9 (ref 5–15)
BUN: 41 mg/dL — ABNORMAL HIGH (ref 6–20)
CO2: 22 mmol/L (ref 22–32)
Calcium: 7.7 mg/dL — ABNORMAL LOW (ref 8.9–10.3)
Chloride: 107 mmol/L (ref 98–111)
Creatinine, Ser: 4.89 mg/dL — ABNORMAL HIGH (ref 0.44–1.00)
GFR calc Af Amer: 13 mL/min — ABNORMAL LOW (ref >60)
GFR calc non Af Amer: 11 mL/min — ABNORMAL LOW (ref >60)
Glucose, Bld: 106 mg/dL — ABNORMAL HIGH (ref 70–99)
Potassium: 4.1 mmol/L (ref 3.5–5.1)
Sodium: 138 mmol/L (ref 135–145)

## 2019-08-15 LAB — CBC
HCT: 42.4 % (ref 36.0–46.0)
Hemoglobin: 13.7 g/dL (ref 12.0–15.0)
MCH: 29.7 pg (ref 26.0–34.0)
MCHC: 32.3 g/dL (ref 30.0–36.0)
MCV: 91.8 fL (ref 80.0–100.0)
Platelets: 242 10*3/uL (ref 150–400)
RBC: 4.62 MIL/uL (ref 3.87–5.11)
RDW: 13.7 % (ref 11.5–15.5)
WBC: 11.3 10*3/uL — ABNORMAL HIGH (ref 4.0–10.5)
nRBC: 0 % (ref 0.0–0.2)

## 2019-08-15 LAB — PROTEIN / CREATININE RATIO, URINE
Creatinine, Urine: 52.97 mg/dL
Protein Creatinine Ratio: 18.07 mg/mg{Cre} — ABNORMAL HIGH (ref 0.00–0.15)
Total Protein, Urine: 957 mg/dL

## 2019-08-15 LAB — SARS CORONAVIRUS 2 (TAT 6-24 HRS): SARS Coronavirus 2: NEGATIVE

## 2019-08-15 MED ORDER — FUROSEMIDE 10 MG/ML IJ SOLN
80.0000 mg | Freq: Two times a day (BID) | INTRAMUSCULAR | Status: DC
  Administered 2019-08-15 – 2019-08-16 (×3): 80 mg via INTRAVENOUS
  Filled 2019-08-15 (×3): qty 8

## 2019-08-15 MED ORDER — HYDRALAZINE HCL 20 MG/ML IJ SOLN: 5.0000 mg | Freq: Four times a day (QID) | INTRAMUSCULAR | Status: DC | PRN

## 2019-08-15 MED ORDER — LABETALOL HCL 200 MG PO TABS
200.0000 mg | ORAL_TABLET | Freq: Two times a day (BID) | ORAL | Status: DC
  Administered 2019-08-15 – 2019-08-20 (×10): 200 mg via ORAL
  Filled 2019-08-15 (×10): qty 1

## 2019-08-15 MED ORDER — HYDRALAZINE HCL 50 MG PO TABS
50.0000 mg | ORAL_TABLET | Freq: Three times a day (TID) | ORAL | Status: DC
  Administered 2019-08-15 – 2019-08-20 (×14): 50 mg via ORAL
  Filled 2019-08-15 (×14): qty 1

## 2019-08-15 NOTE — Progress Notes (Addendum)
PROGRESS NOTE    Natasha Long  [id number] DOB: 01-16-1996 DOA: 08/14/2019 PCP: Patient, No Pcp Per   Brief Narrative: 23 y.o. female with medical history significant of BPD, cocaine abuse, nephrotic syndrome.  Patient started having pain in R flank and upper abdomen over past week.  Had some episodes of N/V as well as diarrhea.  N/V improved but continues to have loose stools.  Earlier this month patient was admitted for suicidal ideations.  Noted to have AKI at that time with creat of 3.0 which improved to 2.5 in ED with IVF before she was ultimately admitted to psych.  She also had strep throat that was treated as well at that time it seems with pen vk.  Sore throat has since resolved she reports.  Denies fever.   ED Course: Creat 4.97, BUN 39.  Albumin 1.2 down from 2.0 earlier this month and 3.4 in June.  UA with neg LE, neg nitrites, 21-50 WBC, 6-10 RBC.  >300 protein.  Assessment & Plan:   Principal Problem:   Acute kidney failure (HCC) Active Problems:   Cocaine abuse (HCC)   Nephrotic syndrome   1]AKI on CKD - pre renal with ? Nephrotic syndrome and Cocaine abuse?  Poststreptococcal nephritis.patient with nausea and vomiting which has resolved.creatinine slightly better than yesterday.    Await nephrology input. UA with negative nitrites and leukocytes more than 300 proteinuria. Renal ultrasound -normal normal-appearing kidneys with mild ascites and small right pleural effusion. WILL TRANSFER PATIENT TO [facility] FOR RENAL BIOPSY/AND MAY NEED HD  2] bipolar disorder/ASD she takes Cymbalta and Abilify at home.  Abilify 400 mg IM every 30 days and Cymbalta 20 mg daily.      DVT prophylaxis: SCDs Code Status: Full Family Communication: No family in room Disposition Plan pending clinical improvement consults called:  Nephrology  Estimated body mass index is 51.85 kg/m as calculated from the following:   Height as of this encounter: 5\' 8"  (1.727 m).  Weight as of this encounter: 154.7 kg.    Subjective:  She is resting in bed denies chest pain shortness of breath nausea vomiting diarrhea complains of swelling all over the body Objective: Vitals:   08/14/19 2125 08/15/19 0034 08/15/19 0629 08/15/19 1359  BP: (!) 143/99 (!) 141/97 (!) 128/95 (!) 165/104  Pulse: 92 99 91 84  Resp: 20  16 17   Temp: 97.8 F (36.6 C) (!) 97.2 F (36.2 C) 98.1 F (36.7 C)   TempSrc: Oral Oral Oral   SpO2: 100% 98% 99% 100%  Weight:      Height:        Intake/Output Summary (Last 24 hours) at 08/15/2019 1411 Last data filed at 08/15/2019 1359 Gross per 24 hour  Intake 1080 ml  Output 700 ml  Net 380 ml   Filed Weights   08/14/19 1752  Weight: (!) 154.7 kg    Examination: Generalized facial swelling General exam: Appears calm and comfortable  Respiratory system: Clear to auscultation. Respiratory effort normal. Cardiovascular system: S1 & S2 heard, RRR. No JVD, murmurs, rubs, gallops or clicks. No pedal edema. Gastrointestinal system: Abdomen is nondistended, soft and nontender. No organomegaly or masses felt. Normal bowel sounds heard. Central nervous system: Alert and oriented. No focal neurological deficits. Extremities 2+ edema Skin: No rashes, lesions or ulcers Psychiatry: Judgement and insight appear normal. Mood & affect appropriate.     Data Reviewed: I have personally reviewed following labs and imaging studies  CBC: Recent Labs  Lab 08/14/19 1752  08/15/19 0346  WBC 12.9* 11.3*  HGB 14.0 13.7  HCT 43.5 42.4  MCV 91.8 91.8  PLT 270 [id number]   Basic Metabolic Panel: Recent Labs  Lab 08/14/19 1859 08/15/19 0346  NA 138 138  K 4.2 4.1  CL 106 107  CO2 23 22  GLUCOSE 94 106*  BUN 39* 41*  CREATININE 4.97* 4.89*  CALCIUM 7.9* 7.7*   GFR: Estimated Creatinine Clearance: 27.8 mL/min (A) (by C-G formula based on SCr of 4.89 mg/dL (H)). Liver Function Tests: Recent Labs  Lab 08/14/19 1859  AST 34  ALT 25  ALKPHOS  59  BILITOT 0.2*  PROT 4.8*  ALBUMIN 1.2*   Recent Labs  Lab 08/14/19 1859  LIPASE 75*   No results for input(s): AMMONIA in the last 168 hours. Coagulation Profile: No results for input(s): INR, PROTIME in the last 168 hours. Cardiac Enzymes: No results for input(s): CKTOTAL, CKMB, CKMBINDEX, TROPONINI in the last 168 hours. BNP (last 3 results) No results for input(s): PROBNP in the last 8760 hours. HbA1C: No results for input(s): HGBA1C in the last 72 hours. CBG: No results for input(s): GLUCAP in the last 168 hours. Lipid Profile: No results for input(s): CHOL, HDL, LDLCALC, TRIG, CHOLHDL, LDLDIRECT in the last 72 hours. Thyroid Function Tests: No results for input(s): TSH, T4TOTAL, FREET4, T3FREE, THYROIDAB in the last 72 hours. Anemia Panel: No results for input(s): VITAMINB12, FOLATE, FERRITIN, TIBC, IRON, RETICCTPCT in the last 72 hours. Sepsis Labs: No results for input(s): PROCALCITON, LATICACIDVEN in the last 168 hours.  Recent Results (from the past 240 hour(s))  SARS CORONAVIRUS 2 (TAT 6-24 HRS) Nasopharyngeal Nasopharyngeal Swab     Status: None   Collection Time: 08/14/19  9:25 PM   Specimen: Nasopharyngeal Swab  Result Value Ref Range Status   SARS Coronavirus 2 NEGATIVE NEGATIVE Final    Comment: (NOTE) SARS-CoV-2 target nucleic acids are NOT DETECTED. The SARS-CoV-2 RNA is generally detectable in upper and lower respiratory specimens during the acute phase of infection. Negative results do not preclude SARS-CoV-2 infection, do not rule out co-infections with other pathogens, and should not be used as the sole basis for treatment or other patient management decisions. Negative results must be combined with clinical observations, patient history, and epidemiological information. The expected result is Negative. Fact Sheet for Patients: SugarRoll.be Fact Sheet for Healthcare Providers:  https://www.woods-.com/ This test is not yet approved or cleared by the [place] FDA and  has been authorized for detection and/or diagnosis of SARS-CoV-2 by FDA under an Emergency Use Authorization (EUA). This EUA will remain  in effect (meaning this test can be used) for the duration of the COVID-19 declaration under Section 56 4(b)(1) of the Act, 21 U.S.C. section 360bbb-3(b)(1), unless the authorization is terminated or revoked sooner. Performed at [facility], [place] [address] [place], [place] [address]          Radiology Studies: US Renal  Result Date: 08/14/2019 CLINICAL DATA:  Elevated creatinine EXAM: RENAL / URINARY TRACT ULTRASOUND COMPLETE COMPARISON:  None. FINDINGS: Right Kidney: Renal measurements: 6.0 x 5.6 cm. = volume: 193 mL . Echogenicity within normal limits. No mass or hydronephrosis visualized. Left Kidney: Renal measurements: 12.5 x 5.6 x 6.8 cm. = volume: 248 mL. Echogenicity within normal limits. No mass or hydronephrosis visualized. Bladder: Appears normal for degree of bladder distention. Incidental note is made of mild ascites as well as a small right-sided pleural effusion. IMPRESSION: Normal-appearing kidneys. Mild ascites and small right  pleural effusion. Electronically Signed   By: Inez Catalina M.D.   On: 08/14/2019 21:55        Scheduled Meds: . DULoxetine  20 mg Oral Daily  . gabapentin  300 mg Oral BID   Continuous Infusions:   LOS: 1 day     Georgette Shell, MD [facility]  If 7PM-7AM, please contact night-coverage [email or web address] Password [facility] 08/15/2019, 2:11 PM

## 2019-08-15 NOTE — Consult Note (Addendum)
Renal Service Consult Note [facility]  Natasha Long 08/15/2019 [name] Requesting Physician:  Dr Rodena Piety  Reason for Consult:  Renal failure HPI: The patient is a 23 y.o. year-old with hx of bipolar d/o and nephrotic syndrome (since age 39) who was admitted to hospital to fatigue, swelling in abdomen/ face/ legs and worsening kidney failure.  Creat is up to 4.97, it was 2.5 about 2 wks ago during an ED visit.  We are asked to see for renal failure.    Pt has had nephrotic syndrome since age 82.  She followed w/ a kidney doctor in [place] until about age 27, she moved to [place] 3-4 yrs ago but hasn't seen any doctors here since she moved.  While seeing the kidney doctor and they did a biopsy at age 1 which showed "scarring" of the glomeruli. She was treated w/ lisinopril. She has problems w/ prednisone (pancreatitis) so she says she is "allergic" to it and hasn't been on it since.  She says back then she only had protein in her urine but not all the swelling.    Patient says she has been doing a lot of cocaine/ drugs lately and this is why here kidneys are flaring up.  Daily tobacco and occ marijuana. No IVDU.  She lives w/ her boyfriend, no family here in town, they are back in [place].   Urinating ok, darker than usual, sometimes foamy, yellow , no tea-colored urine. She is having a lot of fatigue for last 2 weeks , and nausea w/ minimal emesis. Appetite is poor.       From May 2018 - Feb 2020 >> creat 0.94- 1.24  From Aug 30 creat was up to >> 2.5- 3.0  ROS  denies CP  no joint pain   no HA  no blurry vision  no rash  no diarrhea  no dysuria  no difficulty voiding  no change in urine color    Past Medical History  Past Medical History:  Diagnosis Date  . Bipolar affective, manic ([place])   . Hypertension    diagnosed as child; stopped meds at 54 yo  . Hypoglycemia   . Insomnia   . Nephrotic syndrome   . Nephrotic syndrome   . PTSD (post-traumatic  stress disorder)   . Renal disease    Past Surgical History  Past Surgical History:  Procedure Laterality Date  . CHOLECYSTECTOMY    . extraction of wisdom teeth    . RENAL BIOPSY     Family History  Family History  Problem Relation Age of Onset  . Diabetes Other   . Hypertension Other    Social History  reports that she has been smoking e-cigarettes. She has been smoking about 0.50 packs per day. She has never used smokeless tobacco. She reports current drug use. Drugs: Marijuana and Cocaine. She reports that she does not drink alcohol. Allergies  Allergies  Allergen Reactions  . Prednisone Other (See Comments)    Pt states that this med caused pancreatitis.   . Prozac [Fluoxetine Hcl] Other (See Comments)    Panic attack   . Wellbutrin [Bupropion] Other (See Comments)    Panic attack   Home medications Prior to Admission medications   Medication Sig Start Date End Date Taking? Authorizing Provider  ARIPiprazole (ABILIFY MAINTENA IM) Inject 400 mg into the muscle every 30 (thirty) days.   Yes [provider]  DULoxetine (CYMBALTA) 20 MG capsule Take 20 mg by mouth daily.  Yes [provider]  gabapentin (NEURONTIN) 800 MG tablet Take 800 mg by mouth 3 (three) times daily.   Yes [provider]   Liver Function Tests Recent Labs  Lab 08/14/19 1859  AST 34  ALT 25  ALKPHOS 59  BILITOT 0.2*  PROT 4.8*  ALBUMIN 1.2*   Recent Labs  Lab 08/14/19 1859  LIPASE 75*   CBC Recent Labs  Lab 08/14/19 1752 08/15/19 0346  WBC 12.9* 11.3*  HGB 14.0 13.7  HCT 43.5 42.4  MCV 91.8 91.8  PLT 270 [id number]   Basic Metabolic Panel Recent Labs  Lab 08/14/19 1859 08/15/19 0346  NA 138 138  K 4.2 4.1  CL 106 107  CO2 23 22  GLUCOSE 94 106*  BUN 39* 41*  CREATININE 4.97* 4.89*  CALCIUM 7.9* 7.7*   Iron/TIBC/Ferritin/ %Sat No results found for: IRON, TIBC, FERRITIN, IRONPCTSAT  Vitals:   08/14/19 2125 08/15/19 0034 08/15/19 0629 08/15/19 1359   BP: (!) 143/99 (!) 141/97 (!) 128/95 (!) 165/104  Pulse: 92 99 91 84  Resp: 20  16 17   Temp: 97.8 F (36.6 C) (!) 97.2 F (36.2 C) 98.1 F (36.7 C)   TempSrc: Oral Oral Oral   SpO2: 100% 98% 99% 100%  Weight:      Height:        Exam Gen alert, no distress, tearful No rash, cyanosis or gangrene Sclera anicteric, throat clear  No jvd or bruits Chest clear bilat to bases RRR no MRG Abd soft ntnd no mass or ascites +bs obese GU defer MS no joint effusions or deformity Ext 1-2+ bilat LE edema, no wounds or ulcers Neuro is alert, Ox 3 , nf, no asterixis    Home meds:  - aripiprazole 400mg  IM q 30d/ duloxetine 20 qd/ gabapentin 800 tid    UA 21-50 wbc, 6-10 rbc, >300 protein, Hb neg  Renal US R kidney 10.5, no hydro // L kidney 12.5 cm, no hydro     Assessment/ Plan: 1. Renal failure - subacute worsening over last few months, long hx "nephrotic syndrome" onset age 31 in [place].  Will consult IR for renal bx and send off serologies, start IV lasix cautiously . Pt aware may be likely she will need dialysis , esp given her fatigue and nausea which may be early uremic symptoms. Get urine prot:creat ratio.  2. Volume - moderate edema, no resp issues > try cautious IV lasix 3. Bipolar d/o - on meds stable 4. Hx of prednisone allergy - caused "pancreatitis" 5. HTN- bp's still up , on po hydralazine started here, will add labetalol      Kelly Splinter  MD 08/15/2019, 2:20 PM

## 2019-08-15 NOTE — Progress Notes (Signed)
Notified MD Dr. Rodena Piety, Benjamine Mola of patient's blood pressure 165/104

## 2019-08-16 ENCOUNTER — Inpatient Hospital Stay (HOSPITAL_COMMUNITY): Payer: Medicaid Other

## 2019-08-16 LAB — URINE CULTURE: Culture: NO GROWTH

## 2019-08-16 LAB — BASIC METABOLIC PANEL
Anion gap: 8 (ref 5–15)
BUN: 41 mg/dL — ABNORMAL HIGH (ref 6–20)
CO2: 21 mmol/L — ABNORMAL LOW (ref 22–32)
Calcium: 7.4 mg/dL — ABNORMAL LOW (ref 8.9–10.3)
Chloride: 108 mmol/L (ref 98–111)
Creatinine, Ser: 4.9 mg/dL — ABNORMAL HIGH (ref 0.44–1.00)
GFR calc Af Amer: 13 mL/min — ABNORMAL LOW (ref >60)
GFR calc non Af Amer: 11 mL/min — ABNORMAL LOW (ref >60)
Glucose, Bld: 106 mg/dL — ABNORMAL HIGH (ref 70–99)
Potassium: 3.5 mmol/L (ref 3.5–5.1)
Sodium: 137 mmol/L (ref 135–145)

## 2019-08-16 LAB — HEPATITIS PANEL, ACUTE
HCV Ab: <0.1 s/co ratio (ref 0.0–0.9)
Hep A IgM: NEGATIVE
Hep B C IgM: NEGATIVE
Hepatitis B Surface Ag: NEGATIVE

## 2019-08-16 LAB — C4 COMPLEMENT: Complement C4, Body Fluid: 44 mg/dL (ref 14–44)

## 2019-08-16 LAB — MPO/PR-3 (ANCA) ANTIBODIES
ANCA Proteinase 3: <3.5 U/mL (ref 0.0–3.5)
Myeloperoxidase Abs: <9 U/mL (ref 0.0–9.0)

## 2019-08-16 LAB — GLOMERULAR BASEMENT MEMBRANE ANTIBODIES: GBM Ab: 3 units (ref 0–20)

## 2019-08-16 LAB — HIV ANTIBODY (ROUTINE TESTING W REFLEX): HIV Screen 4th Generation wRfx: NONREACTIVE

## 2019-08-16 LAB — C3 COMPLEMENT: C3 Complement: 134 mg/dL (ref 82–167)

## 2019-08-16 LAB — ANTI-DNA ANTIBODY, DOUBLE-STRANDED: ds DNA Ab: <1 IU/mL (ref 0–9)

## 2019-08-16 MED ORDER — FUROSEMIDE 10 MG/ML IJ SOLN
120.0000 mg | Freq: Three times a day (TID) | INTRAVENOUS | Status: DC
  Administered 2019-08-16 – 2019-08-18 (×6): 120 mg via INTRAVENOUS
  Filled 2019-08-16 (×2): qty 12
  Filled 2019-08-16: qty 10
  Filled 2019-08-16 (×2): qty 12
  Filled 2019-08-16: qty 10
  Filled 2019-08-16: qty 12

## 2019-08-16 NOTE — Consult Note (Signed)
Chief Complaint: Patient was seen in consultation today for image guided random renal biopsy Chief Complaint  Patient presents with  . Flank Pain    Referring Physician(s): Schertz,R  Supervising Physician: Shick,T  Patient Status: WL IP  History of Present Illness: Natasha Long is a 23 y.o. female with hx obesity, bipolar d/o, HTN, nephrotic syndrome since childhood, PTSD, drug abuse who was admitted to [facility] on 9/14 with fatigue, LE edema, nausea, poor appetite and worsening renal failure. Latest creat 4.90. Renal US shows normal kidneys with mild ascites and small rt pleural effusion. Request now received for image guided random renal bx for further evaluation. Hx strept throat 2 weeks ago.? Post strept GN.  Past Medical History:  Diagnosis Date  . Bipolar affective, manic ([place])   . Hypertension    diagnosed as child; stopped meds at 17 yo  . Hypoglycemia   . Insomnia   . Nephrotic syndrome   . Nephrotic syndrome   . PTSD (post-traumatic stress disorder)   . Renal disease     Past Surgical History:  Procedure Laterality Date  . CHOLECYSTECTOMY    . extraction of wisdom teeth    . RENAL BIOPSY      Allergies: Prednisone, Prozac [fluoxetine hcl], and Wellbutrin [bupropion]  Medications: Prior to Admission medications   Medication Sig Start Date End Date Taking? Authorizing Provider  ARIPiprazole (ABILIFY MAINTENA IM) Inject 400 mg into the muscle every 30 (thirty) days.   Yes [provider]  DULoxetine (CYMBALTA) 20 MG capsule Take 20 mg by mouth daily.   Yes [provider]  gabapentin (NEURONTIN) 800 MG tablet Take 800 mg by mouth 3 (three) times daily.   Yes [provider]     Family History  Problem Relation Age of Onset  . Diabetes Other   . Hypertension Other     Social History   Socioeconomic History  . Marital status: Single    Spouse name: Not on file  . Number of children: 1  . Years of education: Not on  file  . Highest education level: 12th grade  Occupational History  . Not on file  Social Needs  . Financial resource strain: Patient refused  . Food insecurity    Worry: Patient refused    Inability: Patient refused  . Transportation needs    Medical: Patient refused    Non-medical: Patient refused  Tobacco Use  . Smoking status: Current Every Day Smoker    Packs/day: 0.50    Types: E-cigarettes  . Smokeless tobacco: Never Used  Substance and Sexual Activity  . Alcohol use: No  . Drug use: Yes    Types: Marijuana, Cocaine  . Sexual activity: Yes    Birth control/protection: Condom  Lifestyle  . Physical activity    Days per week: Patient refused    Minutes per session: Patient refused  . Stress: Patient refused  Relationships  . Social Herbalist on phone: Patient refused    Gets together: Patient refused    Attends religious service: Patient refused    Active member of club or organization: Patient refused    Attends meetings of clubs or organizations: Patient refused    Relationship status: Patient refused  Other Topics Concern  . Not on file  Social History Narrative  . Not on file      Review of Systems see above;   currently denies fever,HA,CP,dyspnea, cough, abd/back pain, vomiting or bleeding Vital Signs: BP 131/74 (BP Location:  Left Arm)   Pulse 84   Temp 98.2 F (36.8 C)   Resp 18   Ht 5\' 8"  (1.727 m)   Wt (!) 341 lb (154.7 kg)   LMP 08/08/2019   SpO2 96%   BMI 51.85 kg/m   Physical Exam awake/alert; chest- sl dim BS rt base, left clear; heart- RRR; abd- obese, soft,+BS,NT; LE edema noted bilat  Imaging: US Renal  Result Date: 08/14/2019 CLINICAL DATA:  Elevated creatinine EXAM: RENAL / URINARY TRACT ULTRASOUND COMPLETE COMPARISON:  None. FINDINGS: Right Kidney: Renal measurements: 6.0 x 5.6 cm. = volume: 193 mL . Echogenicity within normal limits. No mass or hydronephrosis visualized. Left Kidney: Renal measurements: 12.5 x 5.6 x 6.8  cm. = volume: 248 mL. Echogenicity within normal limits. No mass or hydronephrosis visualized. Bladder: Appears normal for degree of bladder distention. Incidental note is made of mild ascites as well as a small right-sided pleural effusion. IMPRESSION: Normal-appearing kidneys. Mild ascites and small right pleural effusion. Electronically Signed   By: Inez Catalina M.D.   On: 08/14/2019 21:55    Labs:  CBC: Recent Labs    05/19/19 1110 07/30/19 1518 08/14/19 1752 08/15/19 0346  WBC 10.6* 12.7* 12.9* 11.3*  HGB 15.2* 14.9 14.0 13.7  HCT 46.0 45.4 43.5 42.4  PLT 243 234 270 242    COAGS: No results for input(s): INR, APTT in the last 8760 hours.  BMP: Recent Labs    07/31/19 0625 08/14/19 1859 08/15/19 0346 08/16/19 0315  NA 134* 138 138 137  K 3.4* 4.2 4.1 3.5  CL 105 106 107 108  CO2 19* 23 22 21*  GLUCOSE 99 94 106* 106*  BUN 23* 39* 41* 41*  CALCIUM 6.7* 7.9* 7.7* 7.4*  CREATININE 2.52* 4.97* 4.89* 4.90*  GFRNONAA 26* 11* 11* 11*  GFRAA 30* 13* 13* 13*    LIVER FUNCTION TESTS: Recent Labs    02/08/19 0229 05/19/19 1110 07/30/19 1518 08/14/19 1859  BILITOT 0.5 0.5 0.5 0.2*  AST 20 22 73* 34  ALT 22 25 52* 25  ALKPHOS 49 49 52 59  PROT 6.7 7.2 6.0* 4.8*  ALBUMIN 3.2* 3.4* 2.0* 1.2*    TUMOR MARKERS: No results for input(s): AFPTM, CEA, CA199, CHROMGRNA in the last 8760 hours.  Assessment and Plan: 23 y.o. female with hx obesity, bipolar d/o, HTN, nephrotic syndrome since childhood, PTSD, drug abuse who was admitted to [facility] on 9/14 with fatigue, LE edema, nausea, poor appetite and worsening renal failure. Latest creat 4.90. BP 131/74. Renal US shows normal kidneys with mild ascites and small rt pleural effusion. Request now received for image guided random renal bx for further evaluation. Hx strept throat 2 weeks ago.? Post strept GN.Risks and benefits of procedure was discussed with the patient  including, but not limited to bleeding, infection, damage to  adjacent structures or low yield requiring additional tests. COVID -19 neg.  All of the questions were answered and there is agreement to proceed.  Consent signed and in chart.  Procedure planned for either 9/17 or 9/18   Thank you for this interesting consult.  I greatly enjoyed meeting Liahna Newstrom and look forward to participating in their care.  A copy of this report was sent to the requesting provider on this date.  Electronically Signed: D. Rowe Robert, PA-C 08/16/2019, 6:37 PM   I spent a total of 25 minutes    in face to face in clinical consultation, greater than 50% of which was counseling/coordinating care  for image guided random renal biopsy

## 2019-08-16 NOTE — Progress Notes (Signed)
[facility] Progress Note  Subjective: no new c/o, feels a little better, no SOB or cough.    Vitals:   08/15/19 2037 08/15/19 2246 08/16/19 0425 08/16/19 1413  BP: (!) 148/88 (!) 155/89 139/76 131/74  Pulse: 95 (!) 56 87 84  Resp: 18 18 18 18   Temp: (!) 97.5 F (36.4 C)  (!) 97.5 F (36.4 C) 98.2 F (36.8 C)  TempSrc: Oral  Oral   SpO2:  99% 99% 96%  Weight:      Height:        Inpatient medications: . DULoxetine  20 mg Oral Daily  . gabapentin  300 mg Oral BID  . hydrALAZINE  50 mg Oral Q8H  . labetalol  200 mg Oral BID   . furosemide     acetaminophen **OR** acetaminophen, hydrALAZINE, ondansetron **OR** ondansetron (ZOFRAN) IV    Exam: Gen alert, no distress, tearful No rash, cyanosis or gangrene Sclera anicteric, throat clear  No jvd or bruits Chest clear bilat to bases RRR no MRG Abd soft ntnd no mass or ascites +bs obese GU defer MS no joint effusions or deformity Ext 1-2+ bilat LE edema, no wounds or ulcers Neuro is alert, Ox 3 , nf, no asterixis    Home meds:  - aripiprazole 400mg  IM q 30d/ duloxetine 20 qd/ gabapentin 800 tid    UA 21-50 wbc, 6-10 rbc, >300 protein, Hb neg  Renal US R kidney 10.5, no hydro // L kidney 12.5 cm, no hydro     Assessment/ Plan: 1. Renal failure - subacute worsening over last few months, long hx "nephrotic syndrome" onset age 48 in [place].  Urine PCR is 18gm. Consulting IR for renal bx, needs a bed at [facility] though and none available yet.  No indication for dialysis yet.  2. Volume excess - no resp issues, on RA. Get baseline CXR. Has LE moderate edema. Will ^ lasix to 120 tid IV. Making urine 3. Bipolar d/o - on meds stable 4. Hx of prednisone allergy - she states it caused "pancreatitis", this was more likely due to Imuran 5. HTN- bp's better on hydralazine and labetalol     Rob Sylvester Salonga 08/16/2019, 5:14 PM  Iron/TIBC/Ferritin/ %Sat No results found for: IRON, TIBC, FERRITIN, IRONPCTSAT Recent  Labs  Lab 08/14/19 1859  08/16/19 0315  NA 138   < > 137  K 4.2   < > 3.5  CL 106   < > 108  CO2 23   < > 21*  GLUCOSE 94   < > 106*  BUN 39*   < > 41*  CREATININE 4.97*   < > 4.90*  CALCIUM 7.9*   < > 7.4*  ALBUMIN 1.2*  --   --    < > = values in this interval not displayed.   Recent Labs  Lab 08/14/19 1859  AST 34  ALT 25  ALKPHOS 59  BILITOT 0.2*  PROT 4.8*   Recent Labs  Lab 08/15/19 0346  WBC 11.3*  HGB 13.7  HCT 42.4  PLT 242

## 2019-08-16 NOTE — Progress Notes (Signed)
PROGRESS NOTE  Natasha Long [id number] DOB: Jul 09, 1996 DOA: 08/14/2019 PCP: Patient, No Pcp Per   LOS: 2 days   Brief narrative: 23 y.o.femalewith medical history significant for bipolar disorder, cocaine abuse, nephrotic syndrome presented to the hospital with complaints of right flank pain upper abdominal pain.  This was associated with nausea vomiting and diarrhea..Earlier this month patient was admitted for suicidal ideations. Noted to have AKI at that time with creat of 3.0 which improved to 2.5 in ED with IVF before she was ultimately admitted to psych. She also had strep throat that was treated as well at that time it seems with penicillin V. Sore throat has since resolved.  No fever.  In the ED,Creat 4.97, BUN 39. Albumin 1.2 down from 2.0 earlier this month and 3.4 in June.  UA with neg LE, neg nitrites, 21-50 WBC, 6-10 RBC. >300 protein.  Assessment/Plan:  Principal Problem:   Acute kidney failure (HCC) Active Problems:   Cocaine abuse ([place])   Nephrotic syndrome   Acute cystitis without hematuria   AKI (acute kidney injury) ([place])  Acute kidney injury on chronic kidney disease on the background of nephrotic syndrome.  Patient does have history of cocaine abuse.  Question post streptococcal nephritis.  Patient has been seen by nephrology.  Awaiting transfer to [facility] for renal biopsy and further treatment if needed.Mar[place] Kitchen  UA with negative nitrites and leukocytes more than 300 proteinuria. Renal ultrasound -normal normal-appearing kidneys with mild ascites and small right pleural effusion.  On IV Lasix twice a day.  bipolar disorder/ASD she takes Cymbalta and Abilify at home.  Abilify 400 mg IM every 30 days and Cymbalta 20 mg daily.  Hypertension.  On p.o. hydralazine, beta-blocker.  Will closely monitor.   VTE Prophylaxis: SCD  Code Status: Full code  Family Communication: None  Disposition Plan: Inpatient.  Awaiting for transfer [facility]   Consultants:  Nephrology  Procedures:  None  Antibiotics: Anti-infectives (From admission, onward)   Start     Dose/Rate Route Frequency Ordered Stop   08/14/19 2100  cefTRIAXone (ROCEPHIN) 1 g in sodium chloride 0.9 % 100 mL IVPB     1 g 200 mL/hr over 30 Minutes Intravenous  Once 08/14/19 2054 08/14/19 2158       Subjective: Patient lying in bed.  Denies any shortness of breath, chest pain, palpitation.  Complains of mild puffiness of the eyes and leg edema.  Denies urinary urgency, frequency or dysuria.  Denies hematuria.  Objective: Vitals:   08/15/19 2246 08/16/19 0425  BP: (!) 155/89 139/76  Pulse: (!) 56 87  Resp: 18 18  Temp:  (!) 97.5 F (36.4 C)  SpO2: 99% 99%    Intake/Output Summary (Last 24 hours) at 08/16/2019 1140 Last data filed at 08/16/2019 1000 Gross per 24 hour  Intake 1440 ml  Output 850 ml  Net 590 ml   Filed Weights   08/14/19 1752  Weight: (!) 154.7 kg   Body mass index is 51.85 kg/m.   Physical Exam: GENERAL: Patient is alert awake and oriented. Not in obvious distress.  Morbidly obese HENT: No scleral pallor or icterus. Pupils equally reactive to light. Oral mucosa is moist.  Puffy eyelids, facial swelling. NECK: is supple, no palpable thyroid enlargement. CHEST: Clear to auscultation. No crackles or wheezes. Non tender on palpation. Diminished breath sounds bilaterally. CVS: S1 and S2 heard, no murmur. Regular rate and rhythm. No pericardial rub. ABDOMEN: Soft, non-tender, bowel sounds are present.  No palpable hepato-splenomegaly. EXTREMITIES: Bilateral lower extremity edema. CNS: Cranial nerves are intact. No focal motor or sensory deficits. SKIN: warm and dry without rashes.  Data Review: I have personally reviewed the following laboratory data and studies,  CBC: Recent Labs  Lab 08/14/19 1752 08/15/19 0346  WBC 12.9* 11.3*  HGB 14.0 13.7  HCT 43.5 42.4  MCV 91.8 91.8  PLT 270 [id number]   Basic Metabolic Panel:  Recent Labs  Lab 08/14/19 1859 08/15/19 0346 08/16/19 0315  NA 138 138 137  K 4.2 4.1 3.5  CL 106 107 108  CO2 23 22 21*  GLUCOSE 94 106* 106*  BUN 39* 41* 41*  CREATININE 4.97* 4.89* 4.90*  CALCIUM 7.9* 7.7* 7.4*   Liver Function Tests: Recent Labs  Lab 08/14/19 1859  AST 34  ALT 25  ALKPHOS 59  BILITOT 0.2*  PROT 4.8*  ALBUMIN 1.2*   Recent Labs  Lab 08/14/19 1859  LIPASE 75*   No results for input(s): AMMONIA in the last 168 hours. Cardiac Enzymes: No results for input(s): CKTOTAL, CKMB, CKMBINDEX, TROPONINI in the last 168 hours. BNP (last 3 results) No results for input(s): BNP in the last 8760 hours.  ProBNP (last 3 results) No results for input(s): PROBNP in the last 8760 hours.  CBG: No results for input(s): GLUCAP in the last 168 hours. Recent Results (from the past 240 hour(s))  Urine C&S     Status: None   Collection Time: 08/14/19  5:52 PM   Specimen: Urine, Clean Catch  Result Value Ref Range Status   Specimen Description   Final    URINE, CLEAN CATCH Performed at [facility], [place] [address] [place], [place] [address]    Special Requests   Final    NONE Performed at [facility], [place] [address], [place], [place] [address]    Culture   Final    NO GROWTH Performed at [facility], [place] [address], [place], [place] [address]    Report Status 08/16/2019 FINAL  Final  SARS CORONAVIRUS 2 (TAT 6-24 HRS) Nasopharyngeal Nasopharyngeal Swab     Status: None   Collection Time: 08/14/19  9:25 PM   Specimen: Nasopharyngeal Swab  Result Value Ref Range Status   SARS Coronavirus 2 NEGATIVE NEGATIVE Final    Comment: (NOTE) SARS-CoV-2 target nucleic acids are NOT DETECTED. The SARS-CoV-2 RNA is generally detectable in upper and lower respiratory specimens during the acute phase of infection. Negative results do not preclude SARS-CoV-2 infection, do not rule out co-infections with other pathogens, and  should not be used as the sole basis for treatment or other patient management decisions. Negative results must be combined with clinical observations, patient history, and epidemiological information. The expected result is Negative. Fact Sheet for Patients: SugarRoll.be Fact Sheet for Healthcare Providers: https://www.woods-mathews.com/ This test is not yet approved or cleared by the [place] FDA and  has been authorized for detection and/or diagnosis of SARS-CoV-2 by FDA under an Emergency Use Authorization (EUA). This EUA will remain  in effect (meaning this test can be used) for the duration of the COVID-19 declaration under Section 56 4(b)(1) of the Act, 21 U.S.C. section 360bbb-3(b)(1), unless the authorization is terminated or revoked sooner. Performed at [facility], [place] [address], [place], [place] [address]      Studies: US Renal  Result Date: 08/14/2019 CLINICAL DATA:  Elevated creatinine EXAM: RENAL / URINARY TRACT ULTRASOUND COMPLETE COMPARISON:  None. FINDINGS: Right  Kidney: Renal measurements: 6.0 x 5.6 cm. = volume: 193 mL . Echogenicity within normal limits. No mass or hydronephrosis visualized. Left Kidney: Renal measurements: 12.5 x 5.6 x 6.8 cm. = volume: 248 mL. Echogenicity within normal limits. No mass or hydronephrosis visualized. Bladder: Appears normal for degree of bladder distention. Incidental note is made of mild ascites as well as a small right-sided pleural effusion. IMPRESSION: Normal-appearing kidneys. Mild ascites and small right pleural effusion. Electronically Signed   By: Inez Catalina M.D.   On: 08/14/2019 21:55    Scheduled Meds: . DULoxetine  20 mg Oral Daily  . furosemide  80 mg Intravenous Q12H  . gabapentin  300 mg Oral BID  . hydrALAZINE  50 mg Oral Q8H  . labetalol  200 mg Oral BID    Continuous Infusions:   [name], MD  [facility] 08/16/2019

## 2019-08-17 DIAGNOSIS — F1210 Cannabis abuse, uncomplicated: Secondary | ICD-10-CM

## 2019-08-17 LAB — BASIC METABOLIC PANEL
Anion gap: 6 (ref 5–15)
BUN: 41 mg/dL — ABNORMAL HIGH (ref 6–20)
CO2: 24 mmol/L (ref 22–32)
Calcium: 7.3 mg/dL — ABNORMAL LOW (ref 8.9–10.3)
Chloride: 106 mmol/L (ref 98–111)
Creatinine, Ser: 4.89 mg/dL — ABNORMAL HIGH (ref 0.44–1.00)
GFR calc Af Amer: 13 mL/min — ABNORMAL LOW (ref >60)
GFR calc non Af Amer: 11 mL/min — ABNORMAL LOW (ref >60)
Glucose, Bld: 99 mg/dL (ref 70–99)
Potassium: 3.5 mmol/L (ref 3.5–5.1)
Sodium: 136 mmol/L (ref 135–145)

## 2019-08-17 LAB — CBC WITH DIFFERENTIAL/PLATELET
Abs Immature Granulocytes: 0.02 10*3/uL (ref 0.00–0.07)
Basophils Absolute: 0.1 10*3/uL (ref 0.0–0.1)
Basophils Relative: 1 %
Eosinophils Absolute: 0.1 10*3/uL (ref 0.0–0.5)
Eosinophils Relative: 1 %
HCT: 37.5 % (ref 36.0–46.0)
Hemoglobin: 12.2 g/dL (ref 12.0–15.0)
Immature Granulocytes: 0 %
Lymphocytes Relative: 53 %
Lymphs Abs: 4.6 10*3/uL — ABNORMAL HIGH (ref 0.7–4.0)
MCH: 29.6 pg (ref 26.0–34.0)
MCHC: 32.5 g/dL (ref 30.0–36.0)
MCV: 91 fL (ref 80.0–100.0)
Monocytes Absolute: 0.6 10*3/uL (ref 0.1–1.0)
Monocytes Relative: 7 %
Neutro Abs: 3.3 10*3/uL (ref 1.7–7.7)
Neutrophils Relative %: 38 %
Platelets: 229 10*3/uL (ref 150–400)
RBC: 4.12 MIL/uL (ref 3.87–5.11)
RDW: 13.4 % (ref 11.5–15.5)
WBC: 8.8 10*3/uL (ref 4.0–10.5)
nRBC: 0 % (ref 0.0–0.2)

## 2019-08-17 LAB — ANTINUCLEAR ANTIBODIES, IFA: ANA Ab, IFA: NEGATIVE

## 2019-08-17 LAB — PROTIME-INR
INR: 1 (ref 0.8–1.2)
Prothrombin Time: 12.7 seconds (ref 11.4–15.2)

## 2019-08-17 NOTE — Progress Notes (Signed)
[facility] Progress Note  Subjective: no new c/o, feels a little better, no SOB or cough.    Vitals:   08/16/19 0425 08/16/19 1413 08/16/19 2227 08/17/19 0500  BP: 139/76 131/74 131/78 127/76  Pulse: 87 84 86 85  Resp: 18 18 18 16   Temp: (!) 97.5 F (36.4 C) 98.2 F (36.8 C) 97.8 F (36.6 C) 97.7 F (36.5 C)  TempSrc: Oral  Oral Oral  SpO2: 99% 96% 98% 97%  Weight:      Height:        Inpatient medications: . DULoxetine  20 mg Oral Daily  . gabapentin  300 mg Oral BID  . hydrALAZINE  50 mg Oral Q8H  . labetalol  200 mg Oral BID   . furosemide 120 mg (08/17/19 0511)   acetaminophen **OR** acetaminophen, hydrALAZINE, ondansetron **OR** ondansetron (ZOFRAN) IV    Exam: Gen alert, no distress, calm No jvd or bruits Chest clear bilat to bases RRR no MRG Abd soft ntnd no mass or ascites +bs obese GU defer MS no joint effusions or deformity Ext 1-2+ bilat LE edema, no wounds or ulcers Neuro is alert, Ox 3 , nf    Home meds:  - aripiprazole 400mg  IM q 30d/ duloxetine 20 qd/ gabapentin 800 tid    UA 21-50 wbc, 6-10 rbc, >300 protein, Hb neg  Renal US R kidney 10.5, no hydro // L kidney 12.5 cm, no hydro   Urine sediment today > small amt granular casts, no rbc/ wbc's and no cellular casts , mod epi's   C3/C4, HIV, antids DNA negative; ANA pending   Hep B, hep C, HIV negative   ANCA mpo/ prot 3 neg, antiGBM neg  CXR - nad  Assessment/ Plan: 1. Renal failure - subacute worsening over last few months, long hx "nephrotic syndrome" onset age 34 in [place].  Urine PCR is 18gm. Not uremic at this time, no indication for dialysis.  Consulting IR for renal biopsy. Serologies so far are negative. Urine sediment shows granular casts in low amounts, no cellular/ nephritic changes.  Possible she has ATN from severe hypoalbuminemia/ NS. 2. Volume excess - no resp issues, on RA. CXR negative. Has LE moderate edema.  Cont lasix to 120 tid IV. 3. Bipolar d/o - on  meds stable 4. Hx of prednisone allergy - she states it caused "pancreatitis", this was more likely due to Imuran 5. HTN- bp's better on hydralazine and labetalol     Natasha Long 08/17/2019, 12:09 PM  Iron/TIBC/Ferritin/ %Sat No results found for: IRON, TIBC, FERRITIN, IRONPCTSAT Recent Labs  Lab 08/14/19 1859  08/17/19 0335  NA 138   < > 136  K 4.2   < > 3.5  CL 106   < > 106  CO2 23   < > 24  GLUCOSE 94   < > 99  BUN 39*   < > 41*  CREATININE 4.97*   < > 4.89*  CALCIUM 7.9*   < > 7.3*  ALBUMIN 1.2*  --   --   INR  --   --  1.0   < > = values in this interval not displayed.   Recent Labs  Lab 08/14/19 1859  AST 34  ALT 25  ALKPHOS 59  BILITOT 0.2*  PROT 4.8*   Recent Labs  Lab 08/17/19 0335  WBC 8.8  HGB 12.2  HCT 37.5  PLT 229

## 2019-08-17 NOTE — Progress Notes (Addendum)
PROGRESS NOTE  Natasha Long [id number] DOB: 10-18-1996 DOA: 08/14/2019 PCP: Patient, No Pcp Per   LOS: 3 days   Brief narrative: 23 y.o. female with medical history significant for bipolar disorder, cocaine abuse, nephrotic syndrome presented to the hospital with complaints of right flank pain upper abdominal pain.  This was associated with nausea vomiting and diarrhea..Earlier this month patient was admitted for suicidal ideations.  Noted to have AKI at that time with creat of 3.0 which improved to 2.5 in ED with IVF before she was ultimately admitted to psych.  She also had strep throat that was treated as well at that time it seems with penicillin V. Sore throat has since resolved.  No fever.  In the ED, Creat 4.97, BUN 39.  Albumin 1.2 down from 2.0 earlier this month and 3.4 in June.  UA with neg LE, neg nitrites, 21-50 WBC, 6-10 RBC.  >300 protein.  Assessment/Plan:  Principal Problem:   Acute kidney failure (HCC) Active Problems:   Bipolar affective disorder, depressed, mild (HCC)   Cocaine abuse ([place])   Nephrotic syndrome   Chronic hypertension   Borderline personality disorder ([place])   Marijuana abuse  Acute kidney injury on chronic kidney disease, clinically undetermined at this time. on the background of nephrotic syndrome.  Patient does have history of cocaine abuse.  Question post streptococcal nephritis.  Patient has been seen by nephrology.  Awaiting for renal biopsy.  UA with negative nitrites and leukocytes more than 300 proteinuria. Renal ultrasound -normal normal-appearing kidneys with mild ascites and small right pleural effusion.  On IV Lasix twice a day.   bipolar disorder/ASD she takes Cymbalta and Abilify at home.  Abilify 400 mg IM every 30 days and Cymbalta 20 mg daily.  Hypertension.  On p.o. hydralazine, beta-blocker.  Will closely monitor.  Blood pressure is reasonably stable    VTE Prophylaxis: SCD  Code Status: Full code  Family Communication:  None  Disposition Plan: Inpatient.  Awaiting for renal biopsy.  Possible transfer to [facility], pending   Consultants: Nephrology  Procedures: None  Antibiotics: Anti-infectives (From admission, onward)    Start     Dose/Rate Route Frequency Ordered Stop   08/14/19 2100  cefTRIAXone (ROCEPHIN) 1 g in sodium chloride 0.9 % 100 mL IVPB     1 g 200 mL/hr over 30 Minutes Intravenous  Once 08/14/19 2054 08/14/19 2158        Subjective: Patient lying in bed.  Denies interval complaints.  Denies shortness of breath, chest pain, palpitation, fever.  She has persistent puffiness of the face and lower extremity edema.  Objective: Vitals:   08/16/19 2227 08/17/19 0500  BP: 131/78 127/76  Pulse: 86 85  Resp: 18 16  Temp: 97.8 F (36.6 C) 97.7 F (36.5 C)  SpO2: 98% 97%    Intake/Output Summary (Last 24 hours) at 08/17/2019 1031 Last data filed at 08/17/2019 0900 Gross per 24 hour  Intake 820 ml  Output 650 ml  Net 170 ml   Filed Weights   08/14/19 1752  Weight: (!) 154.7 kg   Body mass index is 51.85 kg/m.   Physical Exam: GENERAL: Patient is alert awake and oriented. Not in obvious distress.  Morbidly obese HENT: No scleral pallor or icterus. Pupils equally reactive to light. Oral mucosa is moist.  Puffy eyelids, facial swelling. NECK: is supple, no palpable thyroid enlargement. CHEST: Clear to auscultation. No crackles or wheezes. Non tender on palpation. Diminished breath sounds bilaterally. CVS: S1  and S2 heard, no murmur. Regular rate and rhythm. No pericardial rub. ABDOMEN: Soft, non-tender, bowel sounds are present. No palpable hepato-splenomegaly. EXTREMITIES: Bilateral lower extremity edema+ CNS: Cranial nerves are intact. No focal motor or sensory deficits. SKIN: warm and dry without rashes.  Data Review: I have personally reviewed the following laboratory data and studies,  CBC: Recent Labs  Lab 08/14/19 1752 08/15/19 0346 08/17/19 0335   WBC 12.9* 11.3* 8.8  NEUTROABS  --   --  3.3  HGB 14.0 13.7 12.2  HCT 43.5 42.4 37.5  MCV 91.8 91.8 91.0  PLT 270 242 [id number]   Basic Metabolic Panel: Recent Labs  Lab 08/14/19 1859 08/15/19 0346 08/16/19 0315 08/17/19 0335  NA 138 138 137 136  K 4.2 4.1 3.5 3.5  CL 106 107 108 106  CO2 23 22 21* 24  GLUCOSE 94 106* 106* 99  BUN 39* 41* 41* 41*  CREATININE 4.97* 4.89* 4.90* 4.89*  CALCIUM 7.9* 7.7* 7.4* 7.3*   Liver Function Tests: Recent Labs  Lab 08/14/19 1859  AST 34  ALT 25  ALKPHOS 59  BILITOT 0.2*  PROT 4.8*  ALBUMIN 1.2*   Recent Labs  Lab 08/14/19 1859  LIPASE 75*   No results for input(s): AMMONIA in the last 168 hours. Cardiac Enzymes: No results for input(s): CKTOTAL, CKMB, CKMBINDEX, TROPONINI in the last 168 hours. BNP (last 3 results) No results for input(s): BNP in the last 8760 hours.  ProBNP (last 3 results) No results for input(s): PROBNP in the last 8760 hours.  CBG: No results for input(s): GLUCAP in the last 168 hours. Recent Results (from the past 240 hour(s))  Urine C&S     Status: None   Collection Time: 08/14/19  5:52 PM   Specimen: Urine, Clean Catch  Result Value Ref Range Status   Specimen Description   Final    URINE, CLEAN CATCH Performed at [facility], [place] [address] [place], [place] [address]    Special Requests   Final    NONE Performed at [facility], [place] [address], [place], [place] [address]    Culture   Final    NO GROWTH Performed at [facility], [place] [address], [place], [place] [address]    Report Status 08/16/2019 FINAL  Final  SARS CORONAVIRUS 2 (TAT 6-24 HRS) Nasopharyngeal Nasopharyngeal Swab     Status: None   Collection Time: 08/14/19  9:25 PM   Specimen: Nasopharyngeal Swab  Result Value Ref Range Status   SARS Coronavirus 2 NEGATIVE NEGATIVE Final    Comment: (NOTE) SARS-CoV-2 target nucleic acids are NOT DETECTED. The SARS-CoV-2 RNA is generally  detectable in upper and lower respiratory specimens during the acute phase of infection. Negative results do not preclude SARS-CoV-2 infection, do not rule out co-infections with other pathogens, and should not be used as the sole basis for treatment or other patient management decisions. Negative results must be combined with clinical observations, patient history, and epidemiological information. The expected result is Negative. Fact Sheet for Patients: SugarRoll.be Fact Sheet for Healthcare Providers: https://www.woods-mathews.com/ This test is not yet approved or cleared by the [place] FDA and  has been authorized for detection and/or diagnosis of SARS-CoV-2 by FDA under an Emergency Use Authorization (EUA). This EUA will remain  in effect (meaning this test can be used) for the duration of the COVID-19 declaration under Section 56 4(b)(1) of the Act, 21 U.S.C. section 360bbb-3(b)(1), unless the authorization is terminated  or revoked sooner. Performed at [facility], [place] [address], [place], [place] [address]      Studies: Dg Chest 2 View  Result Date: 08/16/2019 CLINICAL DATA:  23 y.o. female with medical history significant for bipolar disorder, cocaine abuse, nephrotic syndrome presented to the hospital with complaints of right flank pain upper abdominal pain. Nausea and vomiting. EXAM: CHEST - 2 VIEW COMPARISON:  None. FINDINGS: The heart size and mediastinal contours are within normal limits. Both lungs are clear. The visualized skeletal structures are unremarkable. IMPRESSION: No active cardiopulmonary disease. Electronically Signed   By: Nolon Nations M.D.   On: 08/16/2019 20:02    Scheduled Meds:  DULoxetine  20 mg Oral Daily   gabapentin  300 mg Oral BID   hydrALAZINE  50 mg Oral Q8H   labetalol  200 mg Oral BID    Continuous Infusions:  furosemide 120 mg (08/17/19 0511)     [name], MD  [facility] 08/17/2019

## 2019-08-17 NOTE — Progress Notes (Signed)
IR requested by Dr. Jonnie Finner for possible image-guided random renal biopsy.  Procedure has been reviewed/approved by Dr. Annamaria Boots. Patient was planned for transfer to [facility], however currently there are no beds available. If patient still at [facility] tomorrow, tentatively plan for procedure tomorrow 08/18/2019 in [facility] IR. Patient will be NPO at midnight. She has been seen/consented for procedure.  Please call IR with questions/concerns.   Bea Graff Ky Moskowitz, PA-C 08/17/2019, 3:28 PM

## 2019-08-18 ENCOUNTER — Inpatient Hospital Stay (HOSPITAL_COMMUNITY): Payer: Medicaid Other

## 2019-08-18 LAB — BASIC METABOLIC PANEL
Anion gap: 10 (ref 5–15)
BUN: 38 mg/dL — ABNORMAL HIGH (ref 6–20)
CO2: 22 mmol/L (ref 22–32)
Calcium: 7.5 mg/dL — ABNORMAL LOW (ref 8.9–10.3)
Chloride: 106 mmol/L (ref 98–111)
Creatinine, Ser: 4.59 mg/dL — ABNORMAL HIGH (ref 0.44–1.00)
GFR calc Af Amer: 14 mL/min — ABNORMAL LOW (ref >60)
GFR calc non Af Amer: 12 mL/min — ABNORMAL LOW (ref >60)
Glucose, Bld: 95 mg/dL (ref 70–99)
Potassium: 2.8 mmol/L — ABNORMAL LOW (ref 3.5–5.1)
Sodium: 138 mmol/L (ref 135–145)

## 2019-08-18 LAB — CBC
HCT: 37.2 % (ref 36.0–46.0)
Hemoglobin: 12.3 g/dL (ref 12.0–15.0)
MCH: 29.9 pg (ref 26.0–34.0)
MCHC: 33.1 g/dL (ref 30.0–36.0)
MCV: 90.3 fL (ref 80.0–100.0)
Platelets: 222 10*3/uL (ref 150–400)
RBC: 4.12 MIL/uL (ref 3.87–5.11)
RDW: 13.3 % (ref 11.5–15.5)
WBC: 8.6 10*3/uL (ref 4.0–10.5)
nRBC: 0 % (ref 0.0–0.2)

## 2019-08-18 MED ORDER — FENTANYL CITRATE (PF) 100 MCG/2ML IJ SOLN
INTRAMUSCULAR | Status: AC
  Filled 2019-08-18: qty 2

## 2019-08-18 MED ORDER — MIDAZOLAM HCL 2 MG/2ML IJ SOLN
INTRAMUSCULAR | Status: AC
  Filled 2019-08-18: qty 4

## 2019-08-18 MED ORDER — FENTANYL CITRATE (PF) 100 MCG/2ML IJ SOLN
INTRAMUSCULAR | Status: AC | PRN
  Administered 2019-08-18 (×2): 50 ug via INTRAVENOUS

## 2019-08-18 MED ORDER — LIDOCAINE HCL (PF) 1 % IJ SOLN
INTRAMUSCULAR | Status: AC | PRN
  Administered 2019-08-18: 5 mL

## 2019-08-18 MED ORDER — MIDAZOLAM HCL 2 MG/2ML IJ SOLN
INTRAMUSCULAR | Status: AC | PRN
  Administered 2019-08-18: 2 mg via INTRAVENOUS
  Administered 2019-08-18 (×2): 1 mg via INTRAVENOUS

## 2019-08-18 MED ORDER — LIDOCAINE HCL 1 % IJ SOLN
INTRAMUSCULAR | Status: AC
  Filled 2019-08-18: qty 20

## 2019-08-18 MED ORDER — FUROSEMIDE 40 MG PO TABS
120.0000 mg | ORAL_TABLET | Freq: Two times a day (BID) | ORAL | Status: DC
  Administered 2019-08-19 – 2019-08-20 (×3): 120 mg via ORAL
  Filled 2019-08-18 (×3): qty 3

## 2019-08-18 MED ORDER — HYDROCODONE-ACETAMINOPHEN 5-325 MG PO TABS
1.0000 | ORAL_TABLET | ORAL | Status: DC | PRN
  Administered 2019-08-18: 1 via ORAL
  Administered 2019-08-19 – 2019-08-20 (×5): 2 via ORAL
  Filled 2019-08-18: qty 2
  Filled 2019-08-18: qty 1
  Filled 2019-08-18 (×4): qty 2

## 2019-08-18 MED ORDER — POTASSIUM CHLORIDE CRYS ER 20 MEQ PO TBCR
60.0000 meq | EXTENDED_RELEASE_TABLET | Freq: Once | ORAL | Status: AC
  Administered 2019-08-18: 60 meq via ORAL
  Filled 2019-08-18: qty 3

## 2019-08-18 NOTE — Progress Notes (Signed)
[facility] Progress Note  Subjective: had renal bx this am, no new c/o.  breating better. Is not collecting all the urine, put out "a gallon" yesterday, only 500 cc recorded.   Vitals:   08/18/19 1200 08/18/19 1205 08/18/19 1244 08/18/19 1358  BP: (!) 174/116 (!) 170/93 (!) 146/93 136/67  Pulse: 84 82 77 82  Resp: 14 10 14    Temp:   97.7 F (36.5 C)   TempSrc:      SpO2: 98% 98%  96%  Weight:      Height:        Inpatient medications: . DULoxetine  20 mg Oral Daily  . fentaNYL      . gabapentin  300 mg Oral BID  . hydrALAZINE  50 mg Oral Q8H  . labetalol  200 mg Oral BID  . lidocaine      . midazolam       . furosemide 120 mg (08/18/19 1453)   acetaminophen **OR** acetaminophen, hydrALAZINE, HYDROcodone-acetaminophen, ondansetron **OR** ondansetron (ZOFRAN) IV    Exam: Gen alert, no distress, calm No jvd or bruits Chest clear bilat to bases RRR no MRG Abd soft ntnd no mass or ascites +bs obese GU defer MS no joint effusions or deformity Ext 1-2+ bilat LE edema, no wounds or ulcers Neuro is alert, Ox 3 , nf    Home meds:  - aripiprazole 400mg  IM q 30d/ duloxetine 20 qd/ gabapentin 800 tid  From May 2018 - Feb 2020 >> creat 0.94- 1.24  From Aug 30 creat was up to >> 2.5- 3.0  UA 21-50 wbc, 6-10 rbc, >300 protein, Hb neg  Renal US R kidney 10.5, no hydro // L kidney 12.5 cm, no hydro   Urine sediment 9/16 > small amt granular casts, no rbc/ wbc's and no cellular casts , mod epi's   C3/C4 wnl, HIV neg, anti ds-DNA negative, ANA neg   Hep B, hep C and HIV negative   ANCA mpo/ prot 3 neg, antiGBM neg  CXR - nad  Assessment/ Plan: 1. Renal failure - subacute worsening over last few months, long hx "nephrotic syndrome" onset age 23 in [place].  Urine PCR is 18gm. Creat 4.5 and stable here. Diuresing and SOB better. No uremia, no indication for RRT. Will keep here for now as w/ steroid "allergy" and subacute worsening function, would like to get  biopsy back prior to dc.  2. Volume excess - no resp issues, on RA. CXR negative. Has LE moderate edema.  Change IV lasix to po 120 bid.  3. Bipolar d/o - on meds stable 4. Hx of prednisone allergy - she states it caused "pancreatitis" 5. HTN- bp's better on hydralazine and labetalol     Rob Ary Rudnick 08/18/2019, 4:02 PM  Iron/TIBC/Ferritin/ %Sat No results found for: IRON, TIBC, FERRITIN, IRONPCTSAT Recent Labs  Lab 08/14/19 1859  08/17/19 0335 08/18/19 0317  NA 138   < > 136 138  K 4.2   < > 3.5 2.8*  CL 106   < > 106 106  CO2 23   < > 24 22  GLUCOSE 94   < > 99 95  BUN 39*   < > 41* 38*  CREATININE 4.97*   < > 4.89* 4.59*  CALCIUM 7.9*   < > 7.3* 7.5*  ALBUMIN 1.2*  --   --   --   INR  --   --  1.0  --    < > = values in this  interval not displayed.   Recent Labs  Lab 08/14/19 1859  AST 34  ALT 25  ALKPHOS 59  BILITOT 0.2*  PROT 4.8*   Recent Labs  Lab 08/18/19 0317  WBC 8.6  HGB 12.3  HCT 37.2  PLT 222

## 2019-08-18 NOTE — Procedures (Signed)
  Procedure: US core LLP renal   EBL:   minimal Complications:  none immediate  See full dictation in [facility] PACS.  D. Jaquise Faux MD Main # [phone] Pager  [phone]    

## 2019-08-18 NOTE — Progress Notes (Signed)
Patient has potassium level of 2.8 this AM. Notified MD on call of patient's results and no new orders given as of yet. Dawson Bills, RN

## 2019-08-18 NOTE — Progress Notes (Signed)
PROGRESS NOTE  Natasha Long [id number] DOB: 1996/01/31 DOA: 08/14/2019 PCP: Patient, No Pcp Per   LOS: 4 days   Brief narrative: 23 y.o. female with medical history significant for bipolar disorder, cocaine abuse, nephrotic syndrome presented to the hospital with complaints of right flank pain upper abdominal pain.  This was associated with nausea vomiting and diarrhea..Earlier this month, patient was admitted for suicidal ideations.  Noted to have AKI at that time with creat of 3.0 which improved to 2.5 in ED with IVF before she was ultimately admitted to psych.  She also had strep throat that was treated as well at that time it seems with penicillin V. Sore throat has since resolved.  No fever.  In the ED, Creat 4.97, BUN 39.  Albumin 1.2 down from 2.0 earlier this month and 3.4 in June.  UA with neg LE, neg nitrites, 21-50 WBC, 6-10 RBC.  >300 protein.  Assessment/Plan:  Principal Problem:   Acute kidney failure (HCC) Active Problems:   Bipolar affective disorder, depressed, mild (HCC)   Cocaine abuse ([place])   Nephrotic syndrome   Chronic hypertension   Borderline personality disorder ([place])   Marijuana abuse  Acute kidney injury on chronic kidney disease, clinically undetermined at this time on the background of nephrotic syndrome.  Patient does have history of cocaine abuse.  Question post streptococcal nephritis.  Patient has been seen by nephrology.  Awaiting for renal biopsy.  UA with negative nitrites and leukocytes more than 300 proteinuria. Renal ultrasound -normal normal-appearing kidneys with mild ascites and small right pleural effusion.  On IV Lasix .  Generalized edema/ anasarca.  Chest x-ray is negative.  Continue Lasix for now.   bipolar disorder/ASD she takes Cymbalta and Abilify at home.  Abilify 400 mg IM every 30 days and Cymbalta 20 mg daily.  Hypertension.  On p.o. hydralazine, labetalol..  Will closely monitor.  Blood pressure is reasonably stable   Hypokalemia.  Will give 1 dose of p.o. potassium chloride 60 mEq.  Put the patient on p.o. potassium twice a day.  Check magnesium levels in a.m.    VTE Prophylaxis: SCD  Code Status: Full code  Family Communication: I spoke with the patient's family Mr. Toney Reil on the computer who was really not aware of her condition since he is not much involved in her care.  Disposition Plan: Inpatient.  Awaiting for renal biopsy.  Nephrology following.  Continue Lasix drip as per nephrology.  Consultants:  Nephrology  Procedures:  None  Antibiotics: Anti-infectives (From admission, onward)   Start     Dose/Rate Route Frequency Ordered Stop   08/14/19 2100  cefTRIAXone (ROCEPHIN) 1 g in sodium chloride 0.9 % 100 mL IVPB     1 g 200 mL/hr over 30 Minutes Intravenous  Once 08/14/19 2054 08/14/19 2158     Subjective:  Patient doing complaints.  Complains of puffiness of the face and lower extremity edema which has not improved.  Denies any nausea, vomiting, fever, chills or rigor.  She stated that she could not rest well due to frequent interruptions in the nighttime.  Objective: Vitals:   08/18/19 0459 08/18/19 0500  BP: 112/71   Pulse:    Resp: 18   Temp:  97.7 F (36.5 C)  SpO2:      Intake/Output Summary (Last 24 hours) at 08/18/2019 1020 Last data filed at 08/18/2019 0853 Gross per 24 hour  Intake 1170 ml  Output 500 ml  Net 670 ml   [employer]  08/14/19 1752  Weight: (!) 154.7 kg   Body mass index is 51.85 kg/m.   Physical Exam: General: Morbidly obese, not in obvious distress HENT: Normocephalic, pupils equally reacting to light and accommodation.  No scleral pallor or icterus noted. Oral mucosa is moist.  Facial swelling noted. Chest:  Clear breath sounds.  Diminished breath sounds bilaterally. No crackles or wheezes.  CVS: S1 &S2 heard. No murmur.  Regular rate and rhythm. Abdomen: Soft, nontender, nondistended.  Bowel sounds are heard.  Liver is not palpable,  no abdominal mass palpated Extremities: No cyanosis, clubbing but bilateral lower extremity edema noted peripheral pulses are palpable. Psych: Alert, awake and oriented, normal mood CNS:  No cranial nerve deficits.  Power equal in all extremities.  No sensory deficits noted.  No cerebellar signs.   Skin: Warm and dry.  No rashes noted.  Data Review: I have personally reviewed the following laboratory data and studies,  CBC: Recent Labs  Lab 08/14/19 1752 08/15/19 0346 08/17/19 0335 08/18/19 0317  WBC 12.9* 11.3* 8.8 8.6  NEUTROABS  --   --  3.3  --   HGB 14.0 13.7 12.2 12.3  HCT 43.5 42.4 37.5 37.2  MCV 91.8 91.8 91.0 90.3  PLT 270 242 229 [id number]   Basic Metabolic Panel: Recent Labs  Lab 08/14/19 1859 08/15/19 0346 08/16/19 0315 08/17/19 0335 08/18/19 0317  NA 138 138 137 136 138  K 4.2 4.1 3.5 3.5 2.8*  CL 106 107 108 106 106  CO2 23 22 21* 24 22  GLUCOSE 94 106* 106* 99 95  BUN 39* 41* 41* 41* 38*  CREATININE 4.97* 4.89* 4.90* 4.89* 4.59*  CALCIUM 7.9* 7.7* 7.4* 7.3* 7.5*   Liver Function Tests: Recent Labs  Lab 08/14/19 1859  AST 34  ALT 25  ALKPHOS 59  BILITOT 0.2*  PROT 4.8*  ALBUMIN 1.2*   Recent Labs  Lab 08/14/19 1859  LIPASE 75*   No results for input(s): AMMONIA in the last 168 hours. Cardiac Enzymes: No results for input(s): CKTOTAL, CKMB, CKMBINDEX, TROPONINI in the last 168 hours. BNP (last 3 results) No results for input(s): BNP in the last 8760 hours.  ProBNP (last 3 results) No results for input(s): PROBNP in the last 8760 hours.  CBG: No results for input(s): GLUCAP in the last 168 hours. Recent Results (from the past 240 hour(s))  Urine C&S     Status: None   Collection Time: 08/14/19  5:52 PM   Specimen: Urine, Clean Catch  Result Value Ref Range Status   Specimen Description   Final    URINE, CLEAN CATCH Performed at [facility], [place] [address], [place], [place] [address]    Special Requests   Final     NONE Performed at [facility], [place] [address], [place], [place] [address]    Culture   Final    NO GROWTH Performed at [facility], [place] [address], [place], [place] [address]    Report Status 08/16/2019 FINAL  Final  SARS CORONAVIRUS 2 (TAT 6-24 HRS) Nasopharyngeal Nasopharyngeal Swab     Status: None   Collection Time: 08/14/19  9:25 PM   Specimen: Nasopharyngeal Swab  Result Value Ref Range Status   SARS Coronavirus 2 NEGATIVE NEGATIVE Final    Comment: (NOTE) SARS-CoV-2 target nucleic acids are NOT DETECTED. The SARS-CoV-2 RNA is generally detectable in upper and lower respiratory specimens during the acute phase of infection. Negative results do not preclude SARS-CoV-2  infection, do not rule out co-infections with other pathogens, and should not be used as the sole basis for treatment or other patient management decisions. Negative results must be combined with clinical observations, patient history, and epidemiological information. The expected result is Negative. Fact Sheet for Patients: SugarRoll.be Fact Sheet for Healthcare Providers: https://www.woods-mathews.com/ This test is not yet approved or cleared by the [place] FDA and  has been authorized for detection and/or diagnosis of SARS-CoV-2 by FDA under an Emergency Use Authorization (EUA). This EUA will remain  in effect (meaning this test can be used) for the duration of the COVID-19 declaration under Section 56 4(b)(1) of the Act, 21 U.S.C. section 360bbb-3(b)(1), unless the authorization is terminated or revoked sooner. Performed at [facility], [place] [address], [place], [place] [address]      Studies: Dg Chest 2 View  Result Date: 08/16/2019 CLINICAL DATA:  23 y.o. female with medical history significant for bipolar disorder, cocaine abuse, nephrotic syndrome presented to the hospital with complaints of right flank pain upper  abdominal pain. Nausea and vomiting. EXAM: CHEST - 2 VIEW COMPARISON:  None. FINDINGS: The heart size and mediastinal contours are within normal limits. Both lungs are clear. The visualized skeletal structures are unremarkable. IMPRESSION: No active cardiopulmonary disease. Electronically Signed   By: Nolon Nations M.D.   On: 08/16/2019 20:02    Scheduled Meds: . DULoxetine  20 mg Oral Daily  . gabapentin  300 mg Oral BID  . hydrALAZINE  50 mg Oral Q8H  . labetalol  200 mg Oral BID  . potassium chloride  60 mEq Oral Once    Continuous Infusions: . furosemide 120 mg (08/18/19 0505)     [name], MD  [facility] 08/18/2019

## 2019-08-19 DIAGNOSIS — I10 Essential (primary) hypertension: Secondary | ICD-10-CM

## 2019-08-19 DIAGNOSIS — E876 Hypokalemia: Secondary | ICD-10-CM

## 2019-08-19 DIAGNOSIS — F1210 Cannabis abuse, uncomplicated: Secondary | ICD-10-CM

## 2019-08-19 DIAGNOSIS — F3131 Bipolar disorder, current episode depressed, mild: Secondary | ICD-10-CM

## 2019-08-19 LAB — BASIC METABOLIC PANEL
Anion gap: 8 (ref 5–15)
BUN: 38 mg/dL — ABNORMAL HIGH (ref 6–20)
CO2: 24 mmol/L (ref 22–32)
Calcium: 7.4 mg/dL — ABNORMAL LOW (ref 8.9–10.3)
Chloride: 106 mmol/L (ref 98–111)
Creatinine, Ser: 4.61 mg/dL — ABNORMAL HIGH (ref 0.44–1.00)
GFR calc Af Amer: 14 mL/min — ABNORMAL LOW (ref >60)
GFR calc non Af Amer: 12 mL/min — ABNORMAL LOW (ref >60)
Glucose, Bld: 96 mg/dL (ref 70–99)
Potassium: 2.9 mmol/L — ABNORMAL LOW (ref 3.5–5.1)
Sodium: 138 mmol/L (ref 135–145)

## 2019-08-19 MED ORDER — POTASSIUM CHLORIDE 20 MEQ PO PACK
40.0000 meq | PACK | ORAL | Status: AC
  Administered 2019-08-19 – 2019-08-20 (×2): 40 meq via ORAL
  Filled 2019-08-19 (×2): qty 2

## 2019-08-19 NOTE — Progress Notes (Addendum)
PROGRESS NOTE    Natasha Long  [id number]  DOB: 1996-01-17  DOA: 08/14/2019 PCP: Patient, No Pcp Per  Brief Narrative:   23 y.o.femalewith medical history significant for bipolar disorder, cocaine abuse, nephrotic syndrome presented to the hospital with complaints of right flank pain upper abdominal pain.  This was associated with nausea vomiting and diarrhea..Earlier this month, patient was admitted for suicidal ideations. Noted to have AKI at that time with creat of 3.0 which improved to 2.5 in ED with IVF before she was ultimately admitted to psych. She also had strep throat that was treated as well at that time it seems with penicillin V. Sore throat has since resolved.  No fever.  In the ED,Creat 4.97, BUN 39. Albumin 1.2 down from 2.0 earlier this month and 3.4 in June.  UA with neg LE, neg nitrites, 21-50 WBC, 6-10 RBC. >300 protein.  Subjective:  Patient resting comfortably, denies any acute complaints.  Reports improvement in leg swellings.  Status post renal biopsy on 9/18.  Tolerated well  Objective: Vitals:   08/19/19 0500 08/19/19 0537 08/19/19 0600 08/19/19 1359  BP:  (!) 117/57  (!) 103/55  Pulse:  72  75  Resp:  17  16  Temp:  98.1 F (36.7 C)  97.9 F (36.6 C)  TempSrc:  Oral  Oral  SpO2:  98%  97%  Weight: (!) 145.9 kg  (!) 145.9 kg   Height:        Intake/Output Summary (Last 24 hours) at 08/19/2019 1959 Last data filed at 08/19/2019 1807 Gross per 24 hour  Intake 1440 ml  Output 1200 ml  Net 240 ml   Filed Weights   08/14/19 1752 08/19/19 0500 08/19/19 0600  Weight: (!) 154.7 kg (!) 145.9 kg (!) 145.9 kg    Physical Examination:  General exam: Appears calm and comfortable  Respiratory system: Clear to auscultation. Respiratory effort normal. Cardiovascular system: S1 & S2 heard, RRR. No JVD, murmurs, rubs, gallops or clicks. No pedal edema. Gastrointestinal system: Abdomen is nondistended, soft and nontender. No organomegaly or masses  felt. Normal bowel sounds heard. Central nervous system: Alert and oriented. No focal neurological deficits. Extremities: Symmetric 5 x 5 power.  Trace leg edema Skin: No rashes, lesions or ulcers Psychiatry: Judgement and insight appear normal. Mood & affect appropriate.     Data Reviewed: I have personally reviewed following labs and imaging studies  CBC: Recent Labs  Lab 08/14/19 1752 08/15/19 0346 08/17/19 0335 08/18/19 0317  WBC 12.9* 11.3* 8.8 8.6  NEUTROABS  --   --  3.3  --   HGB 14.0 13.7 12.2 12.3  HCT 43.5 42.4 37.5 37.2  MCV 91.8 91.8 91.0 90.3  PLT 270 242 229 [id number]   Basic Metabolic Panel: Recent Labs  Lab 08/15/19 0346 08/16/19 0315 08/17/19 0335 08/18/19 0317 08/19/19 0304  NA 138 137 136 138 138  K 4.1 3.5 3.5 2.8* 2.9*  CL 107 108 106 106 106  CO2 22 21* 24 22 24   GLUCOSE 106* 106* 99 95 96  BUN 41* 41* 41* 38* 38*  CREATININE 4.89* 4.90* 4.89* 4.59* 4.61*  CALCIUM 7.7* 7.4* 7.3* 7.5* 7.4*   GFR: Estimated Creatinine Clearance: 28.5 mL/min (A) (by C-G formula based on SCr of 4.61 mg/dL (H)). Liver Function Tests: Recent Labs  Lab 08/14/19 1859  AST 34  ALT 25  ALKPHOS 59  BILITOT 0.2*  PROT 4.8*  ALBUMIN 1.2*   Recent Labs  Lab 08/14/19 1859  LIPASE  75*   No results for input(s): AMMONIA in the last 168 hours. Coagulation Profile: Recent Labs  Lab 08/17/19 0335  INR 1.0   Cardiac Enzymes: No results for input(s): CKTOTAL, CKMB, CKMBINDEX, TROPONINI in the last 168 hours. BNP (last 3 results) No results for input(s): PROBNP in the last 8760 hours. HbA1C: No results for input(s): HGBA1C in the last 72 hours. CBG: No results for input(s): GLUCAP in the last 168 hours. Lipid Profile: No results for input(s): CHOL, HDL, LDLCALC, TRIG, CHOLHDL, LDLDIRECT in the last 72 hours. Thyroid Function Tests: No results for input(s): TSH, T4TOTAL, FREET4, T3FREE, THYROIDAB in the last 72 hours. Anemia Panel: No results for input(s):  VITAMINB12, FOLATE, FERRITIN, TIBC, IRON, RETICCTPCT in the last 72 hours. Sepsis Labs: No results for input(s): PROCALCITON, LATICACIDVEN in the last 168 hours.  Recent Results (from the past 240 hour(s))  Urine C&S     Status: None   Collection Time: 08/14/19  5:52 PM   Specimen: Urine, Clean Catch  Result Value Ref Range Status   Specimen Description   Final    URINE, CLEAN CATCH Performed at [facility], [place] [address] [place], [place] [address]    Special Requests   Final    NONE Performed at [facility], [place] [address], [place], [place] [address]    Culture   Final    NO GROWTH Performed at [facility], [place] [address], [place], [place] [address]    Report Status 08/16/2019 FINAL  Final  SARS CORONAVIRUS 2 (TAT 6-24 HRS) Nasopharyngeal Nasopharyngeal Swab     Status: None   Collection Time: 08/14/19  9:25 PM   Specimen: Nasopharyngeal Swab  Result Value Ref Range Status   SARS Coronavirus 2 NEGATIVE NEGATIVE Final    Comment: (NOTE) SARS-CoV-2 target nucleic acids are NOT DETECTED. The SARS-CoV-2 RNA is generally detectable in upper and lower respiratory specimens during the acute phase of infection. Negative results do not preclude SARS-CoV-2 infection, do not rule out co-infections with other pathogens, and should not be used as the sole basis for treatment or other patient management decisions. Negative results must be combined with clinical observations, patient history, and epidemiological information. The expected result is Negative. Fact Sheet for Patients: SugarRoll.be Fact Sheet for Healthcare Providers: https://www.woods-mathews.com/ This test is not yet approved or cleared by the [place] FDA and  has been authorized for detection and/or diagnosis of SARS-CoV-2 by FDA under an Emergency Use Authorization (EUA). This EUA will remain  in effect (meaning this test can  be used) for the duration of the COVID-19 declaration under Section 56 4(b)(1) of the Act, 21 U.S.C. section 360bbb-3(b)(1), unless the authorization is terminated or revoked sooner. Performed at [facility], [place] [address], [place], [place] [address]       Radiology Studies: US Biopsy (kidney)  Result Date: 08/18/2019 CLINICAL DATA:  Chronic renal failure EXAM: ULTRASOUND GUIDED RENAL CORE BIOPSY COMPARISON:  None TECHNIQUE: The procedure, risks (including but not limited to bleeding, infection, organ damage ), benefits, and alternatives were explained to the patient. Questions regarding the procedure were encouraged and answered. The patient understands and consents to the procedure. Survey ultrasound was performed and an appropriate skin entry site was localized. Site was marked, prepped with chlorhexidine, draped in usual sterile fashion, infiltrated locally with 1% lidocaine. Intravenous Fentanyl 139mcg and Versed 4mg  were administered as conscious sedation during continuous monitoring of the patient's level of consciousness and physiological /  cardiorespiratory status by the radiology RN, with a total moderate sedation time of 10 minutes. Under real time ultrasound guidance, a 15 gauge trocar needle was advanced to the margin of the lower pole of the left kidney for 2 coaxial 16 gauge core biopsy needle passes. The core samples were submitted to pathology. The patient tolerated procedure well. COMPLICATIONS: None immediate IMPRESSION: 1. Technically successful ultrasound-guided core renal biopsy , left lower pole. Electronically Signed   By: Lucrezia Europe M.D.   On: 08/18/2019 14:26        Scheduled Meds: . DULoxetine  20 mg Oral Daily  . furosemide  120 mg Oral BID  . gabapentin  300 mg Oral BID  . hydrALAZINE  50 mg Oral Q8H  . labetalol  200 mg Oral BID   Continuous Infusions:  Assessment & Plan:    1.Acute kidney injury on chronic kidney disease: Patient reports long  history of nephrotic syndrome diagnosed as a child. Patient does have history of cocaine abuse.  Question post streptococcal nephritis.  Patient has been seen by nephrology and is status post renal biopsy on 9/18.  Pending results. UAwith negative nitrites and leukocytes more than 300 proteinuria. Renal ultrasound -normal normal-appearing kidneys with mild ascites and small right pleural effusion.  On IV Lasix--> now transition to oral Lasix and titrated to 120 mg twice daily given leg edema.  Patient feels leg swellings improving. Serum creatinine stable around 4.5 now.  Per renal possible discharge in a.m.  2.  Hypertension: Blood pressure elevated in systolic 0000000 to [id number] on [id number], somewhat on the low side now with systolic around [id number]..  On hydralazine 50 every 8 hours and labetalol 200 mg twice daily.  ? Reduce hydralazine to 25 mg upon discharge.  Could be related to recent cocaine use  3.  Bipolar disorder: Resumed on home medications.  She also receives Abilify 400 mg IM every 30 days as outpatient.  4.  Hypokalemia: In the setting of high doses of Lasix.  Potassium level at 2.8 yesterday.Received 60 mEq potassium supplementation.  Repeat level today at 2.9.  Will replace p.o. labs in a.m.  5.  Polysubstance abuse: Patient with history of cocaine/marijuana abuse.  Counseled to quit and advised that possibly contributing to problem #1 and problem #2.  DVT prophylaxis: SCDs Code Status: Full code Family / Patient Communication: Discussed with patient Disposition Plan: Home likely in a.m.     LOS: 5 days    Time spent: 84 min    Guilford Shi, MD [facility] Pager [phone]  If 7PM-7AM, please contact night-coverage [email or web address] Password [facility] 08/19/2019, 7:59 PM

## 2019-08-19 NOTE — Progress Notes (Signed)
[facility] Progress Note  Subjective: no hematuira, getting OOB w/o difficulty.  Creat 4.61 . No C/o  Vitals:   08/19/19 0500 08/19/19 0537 08/19/19 0600 08/19/19 1359  BP:  (!) 117/57  (!) 103/55  Pulse:  72  75  Resp:  17  16  Temp:  98.1 F (36.7 C)  97.9 F (36.6 C)  TempSrc:  Oral  Oral  SpO2:  98%  97%  Weight: (!) 145.9 kg  (!) 145.9 kg   Height:        Inpatient medications: . DULoxetine  20 mg Oral Daily  . furosemide  120 mg Oral BID  . gabapentin  300 mg Oral BID  . hydrALAZINE  50 mg Oral Q8H  . labetalol  200 mg Oral BID    acetaminophen **OR** acetaminophen, hydrALAZINE, HYDROcodone-acetaminophen, ondansetron **OR** ondansetron (ZOFRAN) IV    Exam: Gen alert, no distress, calm No jvd or bruits Chest clear bilat to bases RRR no MRG Abd soft ntnd no mass or ascites +bs obese GU defer MS no joint effusions or deformity Ext 1-2+ bilat LE edema, no wounds or ulcers Neuro is alert, Ox 3 , nf    Home meds:  - aripiprazole 400mg  IM q 30d/ duloxetine 20 qd/ gabapentin 800 tid  From May 2018 - Feb 2020 >> creat 0.94- 1.24  From Aug 30 creat was up to >> 2.5- 3.0  UA 21-50 wbc, 6-10 rbc, >300 protein, Hb neg  Renal US R kidney 10.5, no hydro // L kidney 12.5 cm, no hydro   Urine sediment 9/16 > small amt granular casts, no rbc/ wbc's and no cellular casts , mod epi's   C3/C4 wnl, HIV neg, anti ds-DNA negative, ANA neg   Hep B, hep C and HIV negative   ANCA mpo/ prot 3 neg, antiGBM neg  CXR - nad  Assessment/ Plan: 1. Renal failure - subacute worsening over last few months, long hx "nephrotic syndrome" onset age 5 in [place].  Urine PCR is 18gm. Creat 4.5 and stable here. Diuresing and SOB better. No uremia, no indication for RRT. Poss dc tomorrow (Sunday) 2. Volume excess - no resp issues, on RA. CXR negative. Has LE moderate edema.  IV changed to po lasix 120 bid here. Still mod LE edema.  3. Bipolar d/o - on meds stable 4. Hx of  prednisone allergy - she states it caused "pancreatitis" 5. HTN- bp's better on hydralazine and labetalol     Rob Iziah Cates 08/19/2019, 5:30 PM  Iron/TIBC/Ferritin/ %Sat No results found for: IRON, TIBC, FERRITIN, IRONPCTSAT Recent Labs  Lab 08/14/19 1859  08/17/19 0335  08/19/19 0304  NA 138   < > 136   < > 138  K 4.2   < > 3.5   < > 2.9*  CL 106   < > 106   < > 106  CO2 23   < > 24   < > 24  GLUCOSE 94   < > 99   < > 96  BUN 39*   < > 41*   < > 38*  CREATININE 4.97*   < > 4.89*   < > 4.61*  CALCIUM 7.9*   < > 7.3*   < > 7.4*  ALBUMIN 1.2*  --   --   --   --   INR  --   --  1.0  --   --    < > = values in this interval not displayed.  Recent Labs  Lab 08/14/19 1859  AST 34  ALT 25  ALKPHOS 59  BILITOT 0.2*  PROT 4.8*   Recent Labs  Lab 08/18/19 0317  WBC 8.6  HGB 12.3  HCT 37.2  PLT 222

## 2019-08-19 NOTE — Plan of Care (Signed)
Patient lying in bed this morning; pain okay currently but starting to creep up. Will need pain meds soon. No other needs expressed. Will continue to monitor.

## 2019-08-20 LAB — BASIC METABOLIC PANEL
Anion gap: 9 (ref 5–15)
BUN: 34 mg/dL — ABNORMAL HIGH (ref 6–20)
CO2: 23 mmol/L (ref 22–32)
Calcium: 7.6 mg/dL — ABNORMAL LOW (ref 8.9–10.3)
Chloride: 106 mmol/L (ref 98–111)
Creatinine, Ser: 4.47 mg/dL — ABNORMAL HIGH (ref 0.44–1.00)
GFR calc Af Amer: 15 mL/min — ABNORMAL LOW (ref >60)
GFR calc non Af Amer: 13 mL/min — ABNORMAL LOW (ref >60)
Glucose, Bld: 96 mg/dL (ref 70–99)
Potassium: 3 mmol/L — ABNORMAL LOW (ref 3.5–5.1)
Sodium: 138 mmol/L (ref 135–145)

## 2019-08-20 LAB — POTASSIUM: Potassium: 3.6 mmol/L (ref 3.5–5.1)

## 2019-08-20 MED ORDER — POTASSIUM CHLORIDE 20 MEQ PO PACK
40.0000 meq | PACK | ORAL | Status: AC
  Administered 2019-08-20 (×2): 40 meq via ORAL
  Filled 2019-08-20 (×2): qty 2

## 2019-08-20 MED ORDER — POTASSIUM CHLORIDE ER 20 MEQ PO TBCR: 20.0000 meq | EXTENDED_RELEASE_TABLET | Freq: Every day | ORAL | 0 refills | Status: DC

## 2019-08-20 MED ORDER — FUROSEMIDE 80 MG PO TABS: 80.0000 mg | ORAL_TABLET | Freq: Every day | ORAL | 2 refills | Status: DC

## 2019-08-20 MED ORDER — LABETALOL HCL 200 MG PO TABS: 200.0000 mg | ORAL_TABLET | Freq: Two times a day (BID) | ORAL | 2 refills | Status: DC

## 2019-08-20 MED ORDER — POTASSIUM CHLORIDE 10 MEQ/100ML IV SOLN
10.0000 meq | INTRAVENOUS | Status: AC
  Administered 2019-08-20 (×2): 10 meq via INTRAVENOUS
  Filled 2019-08-20: qty 100

## 2019-08-20 MED ORDER — LABETALOL HCL 200 MG PO TABS: 200.0000 mg | ORAL_TABLET | Freq: Two times a day (BID) | ORAL | Status: DC

## 2019-08-20 MED ORDER — FUROSEMIDE 40 MG PO TABS: 80.0000 mg | ORAL_TABLET | Freq: Every day | ORAL | Status: DC

## 2019-08-20 MED ORDER — GABAPENTIN 600 MG PO TABS: 300.0000 mg | ORAL_TABLET | Freq: Three times a day (TID) | ORAL | 2 refills | Status: DC

## 2019-08-20 NOTE — Discharge Summary (Addendum)
Physician Discharge Summary  Maeven Lawe [id number] DOB: 15-Nov-1996 DOA: 08/14/2019  PCP: Patient, No Pcp Per  Admit date: 08/14/2019 Discharge date: 08/20/2019 Consultations:  Admitted From:  Disposition:   Discharge Diagnoses:  Principal Problem:   Acute kidney failure ([place]) Active Problems:   Bipolar affective disorder, depressed, mild ([place])   Cocaine abuse ([place])   Nephrotic syndrome Accelerated hypertension   Borderline personality disorder ([place])   Marijuana abuse   Hospital Course Summary: 23 y.o.femalewith medical history significant for bipolar disorder, cocaine abuse, nephrotic syndrome presented to the hospital with complaints of right flank pain upper abdominal pain. This was associated with nausea vomiting and diarrhea..Earlier this month,patient was admitted for suicidal ideations. Noted to have AKI at that time with creat of 3.0 which improved to 2.5 in ED with IVF before she was ultimately admitted to psych. She also had strep throat that was treated as well at that time it seems with penicillin V. Sore throat has since resolved. No fever. In the ED,Creat 4.97, BUN 39. Albumin 1.2 down from 2.0 earlier this month and 3.4 in June. UA with neg LE, neg nitrites, 21-50 WBC, 6-10 RBC. >300 protein.  1.Acute kidney injury on chronic kidney disease: Patient reports long history of nephrotic syndrome diagnosed as a child.Patient does have history of cocaine abuse.  ? post streptococcal nephritis. Patient has been seen by nephrology and is status post renal biopsy on 9/18.  Pending results.UAwith negative nitrites and leukocytes more than 300 proteinuria. Renal ultrasound -normal normal-appearing kidneys with mild ascites and small right pleural effusion. Patient received aggressive diuresis during with hospital course with  IV Lasix--> now transition to oral Lasix and titrated to 120 mg twice daily given leg edema.   She responded well to diuretics and currently  leg edema almost resolved.  Serum creatinine stable around 4.5 now.  Per renal okay to discharge home today on 80 mg once daily Lasix with potassium 20 mEq x 5 days, then stop  2.   Accelerated hypertension: Blood pressure initially elevated in systolic [id number] on [id number] in the setting of renal failure, volume overload and recent cocaine use.  Patient received IV diuresis as mentioned above and also started on hydralazine 50 every 8 hours and labetalol 200 mg twice daily while here.  Patient's blood pressure now improved and in fact running on the low side with systolic blood pressure 123XX123 to 110s.  Accordingly, nephrology recommended to reduce Lasix dosage and discontinue hydralazine.  Patient will be discharged on current inpatient dose of labetalol.    3.  Bipolar disorder: Resumed on home medications.  She also receives Abilify 400 mg IM every 30 days as outpatient.  4.  Hypokalemia: In the setting of high doses of Lasix.  Potassium level at 2.8 on 9/18 yesterday.Received 60 mEq potassium supplementation.  Repeat level 9/19 at 2.9.    Received 80 mg p.o. replacement.  Morning labs today showed minimal improvement to 3.0.  Ordered additional replacement with repeat labs this afternoon showing improvement to 3.6.  Nephrology has reduced Lasix dosage for discharge and recommended p.o. supplementation for 5 days.  Patient should have repeat labs done through PCP follow-up in 5 days.  5.  Polysubstance abuse: Patient with history of cocaine/marijuana abuse.  Counseled to quit and advised that possibly contributing to problem #1 and problem #2.  6.  Neuropathy: Patient noted to be on high doses of gabapentin as outpatient (800 mg 3 times daily).  She did well here with lower doses (  300 mg).  Discharge medication dosage adjusted to same given risk of encephalopathy with high doses in the setting of renal failure.  Discharge Exam:  Vitals:   08/20/19 0520 08/20/19 1331  BP: 111/66 107/69  Pulse: 75 78   Resp: 16 20  Temp: 97.9 F (36.6 C) 98.3 F (36.8 C)  SpO2: 97% 97%   Vitals:   08/19/19 1359 08/19/19 2125 08/20/19 0520 08/20/19 1331  BP: (!) 103/55 113/78 111/66 107/69  Pulse: 75 87 75 78  Resp: 16 18 16 20   Temp: 97.9 F (36.6 C) 98.6 F (37 C) 97.9 F (36.6 C) 98.3 F (36.8 C)  TempSrc: Oral Oral Oral Oral  SpO2: 97% 98% 97% 97%  Weight:      Height:        General: Pt is alert, awake, not in acute distress Cardiovascular: RRR, S1/S2 +, no rubs, no gallops Respiratory: CTA bilaterally, no wheezing, no rhonchi Abdominal: Soft, NT, ND, bowel sounds + Extremities: Almost resolved leg edema, no cyanosis  Discharge Condition:Stable CODE STATUS: Diet recommendation: Recommendations for Outpatient Follow-up:  1. Follow up with PCP: 5 days 2. Follow up with consultants: Nephrology Dr. Jonnie Finner as scheduled 3. Please obtain follow up labs including: BMP for serum creatinine/potassium levels  Home Health services upon discharge: None Equipment/Devices upon discharge: None   Discharge Instructions:  Discharge Instructions    (HEART FAILURE PATIENTS) Call MD:  Anytime you have any of the following symptoms: 1) 3 pound weight gain in 24 hours or 5 pounds in 1 week 2) shortness of breath, with or without a dry hacking cough 3) swelling in the hands, feet or stomach 4) if you have to sleep on extra pillows at night in order to breathe.   Complete by: As directed    Call MD for:  extreme fatigue   Complete by: As directed    Call MD for:  persistant dizziness or light-headedness   Complete by: As directed    Call MD for:  persistant nausea and vomiting   Complete by: As directed    Diet - low sodium heart healthy   Complete by: As directed    Increase activity slowly   Complete by: As directed      Allergies as of 08/20/2019      Reactions   Prednisone Other (See Comments)   Pt states that this med caused pancreatitis.    Prozac [fluoxetine Hcl] Other (See  Comments)   Panic attack    Wellbutrin [bupropion] Other (See Comments)   Panic attack      Medication List    TAKE these medications   ABILIFY MAINTENA IM Inject 400 mg into the muscle every 30 (thirty) days.   DULoxetine 20 MG capsule Commonly known as: CYMBALTA Take 20 mg by mouth daily.   furosemide 80 MG tablet Commonly known as: LASIX Take 1 tablet (80 mg total) by mouth daily. Start taking on: August 21, 2019   gabapentin 600 MG tablet Commonly known as: NEURONTIN Take 0.5 tablets (300 mg total) by mouth 3 (three) times daily. What changed:   medication strength  how much to take   labetalol 200 MG tablet Commonly known as: NORMODYNE Take 1 tablet (200 mg total) by mouth 2 (two) times daily.   Potassium Chloride ER 20 MEQ Tbcr Take 20 mEq by mouth daily for 5 days.      Follow-up Information    Roney Jaffe, MD Follow up.   Specialty: Nephrology Why: Our office  will call you in the next few days to set up an appt w/ a kidney doctor at [place]. In the meantime if you have any questions about your kidneys, BP, etc..., you can call Dr Jonnie Finner [phone] Contact information: [address] [place] [address] [phone]          Allergies  Allergen Reactions  . Prednisone Other (See Comments)    Pt states that this med caused pancreatitis.   . Prozac [Fluoxetine Hcl] Other (See Comments)    Panic attack   . Wellbutrin [Bupropion] Other (See Comments)    Panic attack      The results of significant diagnostics from this hospitalization (including imaging, microbiology, ancillary and laboratory) are listed below for reference.    Labs: BNP (last 3 results) No results for input(s): BNP in the last 8760 hours. Basic Metabolic Panel: Recent Labs  Lab 08/16/19 0315 08/17/19 0335 08/18/19 0317 08/19/19 0304 08/20/19 0331 08/20/19 1408  NA 137 136 138 138 138  --   K 3.5 3.5 2.8* 2.9* 3.0* 3.6  CL 108 106 106 106 106  --   CO2 21* 24 22  24 23   --   GLUCOSE 106* 99 95 96 96  --   BUN 41* 41* 38* 38* 34*  --   CREATININE 4.90* 4.89* 4.59* 4.61* 4.47*  --   CALCIUM 7.4* 7.3* 7.5* 7.4* 7.6*  --    Liver Function Tests: Recent Labs  Lab 08/14/19 1859  AST 34  ALT 25  ALKPHOS 59  BILITOT 0.2*  PROT 4.8*  ALBUMIN 1.2*   Recent Labs  Lab 08/14/19 1859  LIPASE 75*   No results for input(s): AMMONIA in the last 168 hours. CBC: Recent Labs  Lab 08/14/19 1752 08/15/19 0346 08/17/19 0335 08/18/19 0317  WBC 12.9* 11.3* 8.8 8.6  NEUTROABS  --   --  3.3  --   HGB 14.0 13.7 12.2 12.3  HCT 43.5 42.4 37.5 37.2  MCV 91.8 91.8 91.0 90.3  PLT 270 242 229 222   Cardiac Enzymes: No results for input(s): CKTOTAL, CKMB, CKMBINDEX, TROPONINI in the last 168 hours. BNP: Invalid input(s): POCBNP CBG: No results for input(s): GLUCAP in the last 168 hours. D-Dimer No results for input(s): DDIMER in the last 72 hours. Hgb A1c No results for input(s): HGBA1C in the last 72 hours. Lipid Profile No results for input(s): CHOL, HDL, LDLCALC, TRIG, CHOLHDL, LDLDIRECT in the last 72 hours. Thyroid function studies No results for input(s): TSH, T4TOTAL, T3FREE, THYROIDAB in the last 72 hours.  Invalid input(s): FREET3 Anemia work up No results for input(s): VITAMINB12, FOLATE, FERRITIN, TIBC, IRON, RETICCTPCT in the last 72 hours. Urinalysis    Component Value Date/Time   COLORURINE YELLOW 08/14/2019 1752   APPEARANCEUR HAZY (A) 08/14/2019 1752   LABSPEC 1.032 (H) 08/14/2019 1752   PHURINE 6.0 08/14/2019 1752   GLUCOSEU 150 (A) 08/14/2019 1752   HGBUR NEGATIVE 08/14/2019 1752   BILIRUBINUR NEGATIVE 08/14/2019 1752   BILIRUBINUR neg 09/15/2017 1657   KETONESUR NEGATIVE 08/14/2019 1752   PROTEINUR >=300 (A) 08/14/2019 1752   UROBILINOGEN 0.2 09/15/2017 1657   NITRITE NEGATIVE 08/14/2019 1752   LEUKOCYTESUR NEGATIVE 08/14/2019 1752   Sepsis Labs Invalid input(s): PROCALCITONIN,  WBC,  LACTICIDVEN Microbiology Recent  Results (from the past 240 hour(s))  Urine C&S     Status: None   Collection Time: 08/14/19  5:52 PM   Specimen: Urine, Clean Catch  Result Value Ref Range Status  Specimen Description   Final    URINE, CLEAN CATCH Performed at [facility], [place] [address] [place], [place] [address]    Special Requests   Final    NONE Performed at [name], [place] [address], [place], [place] [address]    Culture   Final    NO GROWTH Performed at [facility], [place] [address] [place], [place] [address]    Report Status 08/16/2019 FINAL  Final  SARS CORONAVIRUS 2 (TAT 6-24 HRS) Nasopharyngeal Nasopharyngeal Swab     Status: None   Collection Time: 08/14/19  9:25 PM   Specimen: Nasopharyngeal Swab  Result Value Ref Range Status   SARS Coronavirus 2 NEGATIVE NEGATIVE Final    Comment: (NOTE) SARS-CoV-2 target nucleic acids are NOT DETECTED. The SARS-CoV-2 RNA is generally detectable in upper and lower respiratory specimens during the acute phase of infection. Negative results do not preclude SARS-CoV-2 infection, do not rule out co-infections with other pathogens, and should not be used as the sole basis for treatment or other patient management decisions. Negative results must be combined with clinical observations, patient history, and epidemiological information. The expected result is Negative. Fact Sheet for Patients: SugarRoll.be Fact Sheet for Healthcare Providers: https://www.woods-mathews.com/ This test is not yet approved or cleared by the [place] FDA and  has been authorized for detection and/or diagnosis of SARS-CoV-2 by FDA under an Emergency Use Authorization (EUA). This EUA will remain  in effect (meaning this test can be used) for the duration of the COVID-19 declaration under Section 56 4(b)(1) of the Act, 21 U.S.C. section 360bbb-3(b)(1), unless the authorization is terminated  or revoked sooner. Performed at [facility], [place] [address] [place], [place] [address]     Procedures/Studies: Dg Chest 2 View  Result Date: 08/16/2019 CLINICAL DATA:  23 y.o. female with medical history significant for bipolar disorder, cocaine abuse, nephrotic syndrome presented to the hospital with complaints of right flank pain upper abdominal pain. Nausea and vomiting. EXAM: CHEST - 2 VIEW COMPARISON:  None. FINDINGS: The heart size and mediastinal contours are within normal limits. Both lungs are clear. The visualized skeletal structures are unremarkable. IMPRESSION: No active cardiopulmonary disease. Electronically Signed   By: Nolon Nations M.D.   On: 08/16/2019 20:02   US Renal  Result Date: 08/14/2019 CLINICAL DATA:  Elevated creatinine EXAM: RENAL / URINARY TRACT ULTRASOUND COMPLETE COMPARISON:  None. FINDINGS: Right Kidney: Renal measurements: 6.0 x 5.6 cm. = volume: 193 mL . Echogenicity within normal limits. No mass or hydronephrosis visualized. Left Kidney: Renal measurements: 12.5 x 5.6 x 6.8 cm. = volume: 248 mL. Echogenicity within normal limits. No mass or hydronephrosis visualized. Bladder: Appears normal for degree of bladder distention. Incidental note is made of mild ascites as well as a small right-sided pleural effusion. IMPRESSION: Normal-appearing kidneys. Mild ascites and small right pleural effusion. Electronically Signed   By: Inez Catalina M.D.   On: 08/14/2019 21:55   US Biopsy (kidney)  Result Date: 08/18/2019 CLINICAL DATA:  Chronic renal failure EXAM: ULTRASOUND GUIDED RENAL CORE BIOPSY COMPARISON:  None TECHNIQUE: The procedure, risks (including but not limited to bleeding, infection, organ damage ), benefits, and alternatives were explained to the patient. Questions regarding the procedure were encouraged and answered. The patient understands and consents to the procedure. Survey ultrasound was performed and an appropriate skin entry site was  localized. Site was marked, prepped with chlorhexidine, draped in usual sterile fashion, infiltrated locally with  1% lidocaine. Intravenous Fentanyl 174mcg and Versed 4mg  were administered as conscious sedation during continuous monitoring of the patient's level of consciousness and physiological / cardiorespiratory status by the radiology RN, with a total moderate sedation time of 10 minutes. Under real time ultrasound guidance, a 15 gauge trocar needle was advanced to the margin of the lower pole of the left kidney for 2 coaxial 16 gauge core biopsy needle passes. The core samples were submitted to pathology. The patient tolerated procedure well. COMPLICATIONS: None immediate IMPRESSION: 1. Technically successful ultrasound-guided core renal biopsy , left lower pole. Electronically Signed   By: Lucrezia Europe M.D.   On: 08/18/2019 14:26    Time coordinating discharge: Over 30 minutes  SIGNED:   Guilford Shi, MD  [facility] 08/20/2019, 3:17 PM Pager : [phone]

## 2019-08-20 NOTE — Plan of Care (Signed)
Reviewed discharge instructions with patient; copy given. IV removed. Patient ready for discharge.  

## 2019-08-20 NOTE — Plan of Care (Signed)
Patient lying in bed this morning; pain controlled from previous pain medication given. No other needs expressed. Will continue to monitor.

## 2019-08-20 NOTE — Progress Notes (Signed)
[facility] Progress Note  Subjective: no hematuira, getting OOB w/o difficutly. Leg edema  Mostly  Gone and BP's on the low side now.  Happy to be going home.   Vitals:   08/19/19 1359 08/19/19 2125 08/20/19 0520 08/20/19 1331  BP: (!) 103/55 113/78 111/66 107/69  Pulse: 75 87 75 78  Resp: 16 18 16 20   Temp: 97.9 F (36.6 C) 98.6 F (37 C) 97.9 F (36.6 C) 98.3 F (36.8 C)  TempSrc: Oral Oral Oral Oral  SpO2: 97% 98% 97% 97%  Weight:      Height:        Inpatient medications: . DULoxetine  20 mg Oral Daily  . furosemide  120 mg Oral BID  . gabapentin  300 mg Oral BID  . hydrALAZINE  50 mg Oral Q8H  . labetalol  200 mg Oral BID    acetaminophen **OR** acetaminophen, hydrALAZINE, HYDROcodone-acetaminophen, ondansetron **OR** ondansetron (ZOFRAN) IV    Exam: Gen alert, no distress, calm No jvd or bruits Chest clear bilat to bases RRR no MRG Abd soft ntnd no mass or ascites +bs obese GU defer MS no joint effusions or deformity Ext 1-2+ bilat LE edema, no wounds or ulcers Neuro is alert, Ox 3 , nf    Home meds:  - aripiprazole 400mg  IM q 30d/ duloxetine 20 qd/ gabapentin 800 tid  From May 2018 - Feb 2020 >> creat 0.94- 1.24  From Aug 30 creat was up to >> 2.5- 3.0  UA 21-50 wbc, 6-10 rbc, >300 protein, Hb neg  Renal US R kidney 10.5, no hydro // L kidney 12.5 cm, no hydro   Urine sediment 9/16 > small amt granular casts, no rbc/ wbc's and no cellular casts , mod epi's   C3/C4 wnl, HIV neg, anti ds-DNA negative, ANA neg   Hep B, hep C and HIV negative   ANCA mpo/ prot 3 neg, antiGBM neg  CXR - nad  Assessment/ Plan: 1. Renal failure - subacute worsening over last few months, long hx "nephrotic syndrome" onset age 49 in [place].  Urine PCR is 18gm. Creat 4.5 and stable here. She diuresed 20 lbs and feels better. She had renal biopsy on Friday 9/18, results pending. Our office will call her in the next few days to set up f/u appt in the weeks to  come.  She is ok for dc today.  2. Volume excess - dc on po lasix 80 qd 3. Bipolar d/o - on meds stable 4. Hx of prednisone allergy - she states it caused "pancreatitis" 5. HTN- dc'd hydralazine, dc on po labetalol only     Rob Rhyan Radler 08/20/2019, 2:39 PM  Iron/TIBC/Ferritin/ %Sat No results found for: IRON, TIBC, FERRITIN, IRONPCTSAT Recent Labs  Lab 08/14/19 1859  08/17/19 0335  08/20/19 0331 08/20/19 1408  NA 138   < > 136   < > 138  --   K 4.2   < > 3.5   < > 3.0* 3.6  CL 106   < > 106   < > 106  --   CO2 23   < > 24   < > 23  --   GLUCOSE 94   < > 99   < > 96  --   BUN 39*   < > 41*   < > 34*  --   CREATININE 4.97*   < > 4.89*   < > 4.47*  --   CALCIUM 7.9*   < >  7.3*   < > 7.6*  --   ALBUMIN 1.2*  --   --   --   --   --   INR  --   --  1.0  --   --   --    < > = values in this interval not displayed.   Recent Labs  Lab 08/14/19 1859  AST 34  ALT 25  ALKPHOS 59  BILITOT 0.2*  PROT 4.8*   Recent Labs  Lab 08/18/19 0317  WBC 8.6  HGB 12.3  HCT 37.2  PLT 222

## 2019-09-13 ENCOUNTER — Encounter (HOSPITAL_COMMUNITY): Payer: Self-pay | Admitting: Nephrology

## 2019-09-13 LAB — SURGICAL PATHOLOGY

## 2019-11-08 ENCOUNTER — Other Ambulatory Visit: Payer: Self-pay | Admitting: Critical Care Medicine

## 2019-11-08 ENCOUNTER — Encounter: Payer: Self-pay | Admitting: Critical Care Medicine

## 2019-11-08 MED ORDER — FUROSEMIDE 80 MG PO TABS: 80.0000 mg | ORAL_TABLET | Freq: Two times a day (BID) | ORAL | 2 refills | Status: DC

## 2019-11-08 MED ORDER — LABETALOL HCL 200 MG PO TABS: 200.0000 mg | ORAL_TABLET | Freq: Two times a day (BID) | ORAL | 2 refills | Status: DC

## 2019-11-08 MED ORDER — POTASSIUM CHLORIDE ER 20 MEQ PO TBCR: 20.0000 meq | EXTENDED_RELEASE_TABLET | Freq: Every day | ORAL | 0 refills | Status: DC

## 2019-11-09 NOTE — Progress Notes (Signed)
Patient ID: Natasha Long, female   DOB: 12/13/1995, 23 y.o.   MRN: [id number] This is a 23 year old female [facility]  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  She has nephrotic syndrome and chronic cocaine use.  She was in the hospital recently and was seen by nephrology.  Unfortunate she is not been able to achieve any post hospital follow-up with nephrology.  She does have Medicaid at this time.  She is about to run out of her furosemide.  She is had increased edema in the legs and feet.  She slightly dyspneic with exertion.  Her last use of cocaine was a week ago Friday on 4 December.  She also is about to run out of her labetalol.  She was receiving Abilify every 2 weeks as an injection however she is not received that as well.  DC summary fromSept Admit date: 08/14/2019 Discharge date: 08/20/2019 Consultations:  Admitted From:  Disposition:   Discharge Diagnoses:  Principal Problem:   Acute kidney failure ([place]) Active Problems:   Bipolar affective disorder, depressed, mild ([place])   Cocaine abuse ([place])   Nephrotic syndrome Accelerated hypertension   Borderline personality disorder ([place])   Marijuana abuse   Hospital Course Summary: 23 y.o.femalewith medical history significant for bipolar disorder, cocaine abuse, nephrotic syndrome presented to the hospital with complaints of right flank pain upper abdominal pain. This was associated with nausea vomiting and diarrhea..Earlier this month,patient was admitted for suicidal ideations. Noted to have AKI at that time with creat of 3.0 which improved to 2.5 in ED with IVF before she was ultimately admitted to psych. She also had strep throat that was treated as well at that time it seems with penicillin V. Sore throat has since resolved. No fever. In the ED,Creat 4.97, BUN 39. Albumin 1.2 down from 2.0 earlier this month and 3.4 in June. UA with neg  LE, neg nitrites, 21-50 WBC, 6-10 RBC. >300 protein.  1.Acute kidney injury on chronic kidney disease:Patient reports long history of nephrotic syndrome diagnosed as a child.Patient does have history of cocaine abuse.  ? post streptococcal nephritis. Patient has been seen by nephrologyand is status post renal biopsy on 9/18. Pending results.UAwith negative nitrites and leukocytes more than 300 proteinuria. Renal ultrasound -normal normal-appearing kidneys with mild ascites and small right pleural effusion. Patient received aggressive diuresis during with hospital course with  IV Lasix-->now transition to oral Lasix and titrated to 120 mg twice daily given leg edema.  She responded well to diuretics and currently leg edema almost resolved.  Serumcreatinine stable around 4.5 now. Per renal okay to discharge home today on 80 mg once daily Lasix with potassium 20 mEq x 5 days, then stop  2.  Acceleratedhypertension:Blood pressure initially elevated in systolic [id number] on [id number] in the setting of renal failure, volume overload and recent cocaine use.  Patient received IV diuresis as mentioned above and also startedon hydralazine 50 every 8 hours and labetalol 200 mg twice daily while here.  Patient's blood pressure now improved and in fact running on the low side with systolic blood pressure 123XX123 to 110s.  Accordingly, nephrology recommended to reduce Lasix dosage and discontinue hydralazine.  Patient will be discharged on current inpatient dose of labetalol.    3.Bipolar disorder: Resumed on home medications. She also receivesAbilify 400 mg IM every 30 daysas outpatient.  4.Hypokalemia:In the setting of high doses of Lasix.Potassium level at 2.8 on 9/18  yesterday.Received60 mEqpotassium supplementation. Repeat level 9/19 at 2.9.   Received 80 mg p.o. replacement.  Morning labs today showed minimal improvement to 3.0.  Ordered additional replacement with repeat labs this  afternoon showing improvement to 3.6.  Nephrology has reduced Lasix dosage for discharge and recommended p.o. supplementation for 5 days.  Patient should have repeat labs done through PCP follow-up in 5 days.  5.Polysubstance abuse: Patient with history of cocaine/marijuana abuse. Counseled to quit and advised that possibly contributing to problem #1 and problem #2.  6.  Neuropathy: Patient noted to be on high doses of gabapentin as outpatient (800 mg 3 times daily).  She did well here with lower doses (300 mg).  Discharge medication dosage adjusted to same given risk of encephalopathy with high doses in the setting of renal failure.   Exam blood pressure 130/94 saturation 100% pulse 93  This is an obese female in no acute distress there is 3+ edema in the lower extremities pretibially  Impression is that of CKD stage IV  with nephrotic syndrome and progression in disease process with ongoing crack cocaine use  Plan will be to attempt to get this patient back to [facility] for mental health care and as well refill the furosemide at 80 mg twice daily the Normodyne at 200 mg twice daily and potassium supplementation 20 mEq daily for 15 days  We will also attempt to get this patient back into nephrology for further evaluations

## 2019-11-28 ENCOUNTER — Other Ambulatory Visit: Payer: Self-pay

## 2019-11-28 ENCOUNTER — Encounter (HOSPITAL_COMMUNITY): Payer: Self-pay

## 2019-11-28 ENCOUNTER — Emergency Department (HOSPITAL_COMMUNITY)
Admission: EM | Admit: 2019-11-28 | Discharge: 2019-11-28 | Disposition: A | Discharge status: Home / Self Care | Payer: Medicaid Other | Attending: Emergency Medicine | Admitting: Emergency Medicine

## 2019-11-28 DIAGNOSIS — Z202 Contact with and (suspected) exposure to infections with a predominantly sexual mode of transmission: Secondary | ICD-10-CM

## 2019-11-28 DIAGNOSIS — F17210 Nicotine dependence, cigarettes, uncomplicated: Secondary | ICD-10-CM | POA: Diagnosis not present

## 2019-11-28 DIAGNOSIS — N760 Acute vaginitis: Secondary | ICD-10-CM | POA: Insufficient documentation

## 2019-11-28 DIAGNOSIS — A5901 Trichomonal vulvovaginitis: Secondary | ICD-10-CM

## 2019-11-28 DIAGNOSIS — I10 Essential (primary) hypertension: Secondary | ICD-10-CM | POA: Diagnosis not present

## 2019-11-28 DIAGNOSIS — B9689 Other specified bacterial agents as the cause of diseases classified elsewhere: Secondary | ICD-10-CM

## 2019-11-28 DIAGNOSIS — Z79899 Other long term (current) drug therapy: Secondary | ICD-10-CM | POA: Diagnosis not present

## 2019-11-28 DIAGNOSIS — N898 Other specified noninflammatory disorders of vagina: Secondary | ICD-10-CM | POA: Diagnosis present

## 2019-11-28 LAB — URINALYSIS, ROUTINE W REFLEX MICROSCOPIC
Bilirubin Urine: NEGATIVE
Glucose, UA: 150 mg/dL — AB
Hgb urine dipstick: NEGATIVE
Ketones, ur: NEGATIVE mg/dL
Leukocytes,Ua: NEGATIVE
Nitrite: NEGATIVE
Protein, ur: >=300 mg/dL — AB
Specific Gravity, Urine: 1.024 (ref 1.005–1.030)
WBC, UA: >50 WBC/hpf — ABNORMAL HIGH (ref 0–5)
pH: 6 (ref 5.0–8.0)

## 2019-11-28 LAB — RAPID HIV SCREEN (HIV 1/2 AB+AG)
HIV 1/2 Antibodies: NONREACTIVE
HIV-1 P24 Antigen - HIV24: NONREACTIVE

## 2019-11-28 LAB — WET PREP, GENITAL
Sperm: NONE SEEN
Yeast Wet Prep HPF POC: NONE SEEN

## 2019-11-28 MED ORDER — METRONIDAZOLE 500 MG PO TABS
2000.0000 mg | ORAL_TABLET | Freq: Once | ORAL | Status: AC
  Administered 2019-11-28: 2000 mg via ORAL
  Filled 2019-11-28: qty 4

## 2019-11-28 MED ORDER — DOXYCYCLINE HYCLATE 100 MG PO CAPS: 100.0000 mg | ORAL_CAPSULE | Freq: Two times a day (BID) | ORAL | 0 refills | Status: AC

## 2019-11-28 MED ORDER — CEFTRIAXONE SODIUM 1 G IJ SOLR
500.0000 mg | Freq: Once | INTRAMUSCULAR | Status: AC
  Administered 2019-11-28: 500 mg via INTRAMUSCULAR
  Filled 2019-11-28: qty 10

## 2019-11-28 MED ORDER — STERILE WATER FOR INJECTION IJ SOLN
INTRAMUSCULAR | Status: AC
  Filled 2019-11-28: qty 10

## 2019-11-28 NOTE — ED Notes (Signed)
HIV Non-Reactive

## 2019-11-28 NOTE — Discharge Instructions (Addendum)
Today you were treated for gonorrhea and trichomonas.  A prescription for antibiotics has been sent off to treat you for chlamydia, please take 1 tablet twice a day for the next 7 days.

## 2019-11-28 NOTE — ED Triage Notes (Signed)
Pt reports vaginal discharge and discomfort with urination. Pt reports new sexual partners.

## 2019-11-28 NOTE — ED Provider Notes (Signed)
[place] DEPT Provider Note   CSN: [id number] Arrival date & time: 11/28/19  1245     History Chief Complaint  Patient presents with  . Exposure to STD    Natasha Long is a 23 y.o. female.  23 y.o female with a PMH Bipolar, HTN, Nephrotic syndrome presents to the ED with a chief complaint of vaginal discharge x 2 weeks. Patient reports she is sexually active with multiple partners behavior has now stopped.  She reports a thick colorless discharge for the past week, this has a foul odor to it along with describing it as "guarded like he ".  I want all 9 years as have had sex with multiple partners. Last Menstrual cycle November 23, 2019.  He has not tried any medication for improvement in symptoms but does report a prior history of chlamydia and gonorrhea with similar symptoms.  The history is provided by the patient and medical records.       Past Medical History:  Diagnosis Date  . Bipolar affective, manic ([place])   . Hypertension    diagnosed as child; stopped meds at 87 yo  . Hypoglycemia   . Insomnia   . Nephrotic syndrome   . Nephrotic syndrome   . PTSD (post-traumatic stress disorder)   . Renal disease     Patient Active Problem List   Diagnosis Date Noted  . Marijuana abuse 08/17/2019  . Acute kidney failure ([place]) 08/14/2019  . Affective psychosis, bipolar ([place]) 06/27/2019  . Closed displaced fracture of shaft of fifth metacarpal bone of left hand 02/28/2019  . MDD (major depressive disorder), recurrent severe, without psychosis ([place]) 02/08/2019  . MDD (major depressive disorder), severe ([place]) 01/13/2019  . Borderline personality disorder ([place]) 11/16/2018  . Moderate cannabis use disorder ([place]) 11/16/2018  . Severe recurrent major depression without psychotic features ([place]) 11/15/2018  . 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 Chronic hypertension 04/04/2018  . Nephrotic syndrome 04/01/2018  . Supervision of  high risk pregnancy, antepartum 04/01/2018  . Rh negative status during pregnancy 04/01/2018  . PTSD (post-traumatic stress disorder) 04/01/2018  . Bipolar affective disorder, depressed, mild ([place]) 04/09/2017  . Cocaine abuse ([place]) 04/09/2017    Past Surgical History:  Procedure Laterality Date  . CHOLECYSTECTOMY    . extraction of wisdom teeth    . RENAL BIOPSY       OB History    Gravida  2   Para  1   Term  1   Preterm      AB      Living  1     SAB      TAB      Ectopic      Multiple      Live Births  1           Family History  Problem Relation Age of Onset  . Diabetes Other   . Hypertension Other     Social History   Tobacco Use  . Smoking status: Current Every Day Smoker    Packs/day: 0.50    Types: E-cigarettes  . Smokeless tobacco: Never Used  Substance Use Topics  . Alcohol use: No  . Drug use: Yes    Types: Marijuana, Cocaine    Home Medications Prior to Admission medications   Medication Sig Start Date End Date Taking? Authorizing Provider  ARIPiprazole (ABILIFY MAINTENA IM) Inject 400 mg into the muscle every 30 (thirty) days.   Yes [provider]  DULoxetine (CYMBALTA) 20 MG capsule Take 20 mg by mouth daily.   Yes [provider]  furosemide (LASIX) 80 MG tablet Take 1 tablet (80 mg total) by mouth 2 (two) times daily. 11/08/19  Yes [name], MD  gabapentin (NEURONTIN) 600 MG tablet Take 0.5 tablets (300 mg total) by mouth 3 (three) times daily. 08/20/19 11/28/19 Yes Guilford Shi, MD  labetalol (NORMODYNE) 200 MG tablet Take 1 tablet (200 mg total) by mouth 2 (two) times daily. 11/08/19 02/06/20 Yes [name], MD  Potassium Chloride ER 20 MEQ TBCR Take 20 mEq by mouth daily for 5 days. 11/08/19 11/28/19 Yes [name], MD  doxycycline (VIBRAMYCIN) 100 MG capsule Take 1 capsule (100 mg total) by mouth 2 (two) times daily for 7 days. 11/28/19 12/05/19  Janeece Fitting, PA-C    Allergies      Prednisone, Prozac [fluoxetine hcl], and Wellbutrin [bupropion]  Review of Systems   Review of Systems  Constitutional: Negative for fever.  Eyes: Negative for photophobia.  Cardiovascular: Negative for chest pain.  Gastrointestinal: Negative for abdominal pain.  Genitourinary: Positive for vaginal discharge. Negative for decreased urine volume, dyspareunia, dysuria, flank pain and vaginal bleeding.    Physical Exam Updated Vital Signs BP 123/61   Pulse 74   Temp 98.5 F (36.9 C) (Oral)   Resp (!) 22   Wt 132.5 kg   SpO2 100%   BMI 44.40 kg/m   Physical Exam Vitals and nursing note reviewed. Exam conducted with a chaperone present.  Constitutional:      General: She is not in acute distress.    Appearance: She is well-developed. She is obese.  HENT:     Head: Normocephalic and atraumatic.     Mouth/Throat:     Pharynx: No oropharyngeal exudate.  Eyes:     Pupils: Pupils are equal, round, and reactive to light.  Cardiovascular:     Rate and Rhythm: Regular rhythm.     Heart sounds: Normal heart sounds.  Pulmonary:     Effort: Pulmonary effort is normal. No respiratory distress.     Breath sounds: Normal breath sounds.  Abdominal:     General: Bowel sounds are normal. There is no distension.     Palpations: Abdomen is soft.     Tenderness: There is no abdominal tenderness.  Genitourinary:    Exam position: Supine.     Labia:        Right: No rash or tenderness.        Left: No rash or tenderness.      Vagina: Vaginal discharge present. No erythema, tenderness or bleeding.     Cervix: Discharge present.     Adnexa: Right adnexa normal and left adnexa normal.     Comments: Rebekah RN chaperoned.  Thick green/white discharge in vaginal vault.  No CMT, no adnexal tenderness. Musculoskeletal:        General: No tenderness or deformity.     Cervical back: Normal range of motion.     Right lower leg: No edema.     Left lower leg: No edema.  Skin:    General: Skin  is warm and dry.  Neurological:     Mental Status: She is alert and oriented to person, place, and time.     ED Results / Procedures / Treatments   Labs (all labs ordered are listed, but only abnormal results are displayed) Labs Reviewed  WET PREP, GENITAL - Abnormal; Notable for the following components:  Result Value   Trich, Wet Prep PRESENT (*)    Clue Cells Wet Prep HPF POC PRESENT (*)    WBC, Wet Prep HPF POC MANY (*)    All other components within normal limits  URINALYSIS, ROUTINE W REFLEX MICROSCOPIC - Abnormal; Notable for the following components:   APPearance HAZY (*)    Glucose, UA 150 (*)    Protein, ur >=300 (*)    WBC, UA >50 (*)    Bacteria, UA RARE (*)    All other components within normal limits  RAPID HIV SCREEN (HIV 1/2 AB+AG)  RPR  GC/CHLAMYDIA PROBE AMP ([place]) NOT AT [facility]    EKG None  Radiology No results found.  Procedures Procedures (including critical care time)  Medications Ordered in ED Medications  cefTRIAXone (ROCEPHIN) injection 500 mg (has no administration in time range)  metroNIDAZOLE (FLAGYL) tablet 2,000 mg (has no administration in time range)  sterile water (preservative free) injection (has no administration in time range)    ED Course  I have reviewed the triage vital signs and the nursing notes.  Pertinent labs & imaging results that were available during my care of the patient were reviewed by me and considered in my medical decision making (see chart for details).  Clinical Course as of Nov 28 1651  Tue Nov 28, 2019  1637 WBC, Wet Prep HPF POC(!): MANY [JS]  1637 Clue Cells Wet Prep HPF POC(!): PRESENT [JS]  1637 Trich, Wet Prep(!): PRESENT [JS]    Clinical Course User Index [JS] Janeece Fitting, PA-C   MDM Rules/Calculators/A&P   Patient with a past medical history of chlamydia and gonorrhea presents to the ED with complaints of vaginal discharge for the past week.  She reports multiple sexual partners in  the past week. She has noted a white creamy discharge in the past week, with foul odor. No OTC medication but does report this feels like her previous STD.  She states "I want the whole 9 yards because I been messing with many men". Pelvic exam will be performed to further evaluate STI. Vitals are within normal limits.   Pelvic exam performed by me with Vilinda Boehringer.  No CMT, adnexal tenderness.  Significant green discharge on vaginal vault.  Wet prep remarkable for trichomonas, clue cells, many white blood cell count.  UA with some hazy appearance, blood cell count is now greater than 50.  I have given patient 2 g of Flagyl to treat for trichomonas, IM shot of Rocephin for gonorrhea.  She also go home with a prescription for Doxy to treat her chlamydia.  We discussed treatment of BV, at this time this will be held off.  Patient is requesting discharge home when she has to have transportation in order to leave the ED.  With otherwise stable vital signs stable for discharge.   Portions of this note were generated with Lobbyist. Dictation errors may occur despite best attempts at proofreading.  Final Clinical Impression(s) / ED Diagnoses Final diagnoses:  STD exposure  BV (bacterial vaginosis)  Trichomonas vaginitis    Rx / DC Orders ED Discharge Orders         Ordered    doxycycline (VIBRAMYCIN) 100 MG capsule  2 times daily     11/28/19 1650           Janeece Fitting, [place] 11/28/19 1652    Wyvonnia Dusky, MD 11/28/19 2115

## 2019-11-29 LAB — RPR: RPR Ser Ql: NONREACTIVE

## 2019-11-30 LAB — GC/CHLAMYDIA PROBE AMP (~~LOC~~) NOT AT ARMC
Chlamydia: NEGATIVE
Neisseria Gonorrhea: POSITIVE — AB

## 2019-12-27 ENCOUNTER — Other Ambulatory Visit: Payer: Self-pay | Admitting: Critical Care Medicine

## 2019-12-27 ENCOUNTER — Encounter: Payer: Self-pay | Admitting: Critical Care Medicine

## 2019-12-27 MED ORDER — GABAPENTIN 600 MG PO TABS: 300.0000 mg | ORAL_TABLET | Freq: Three times a day (TID) | ORAL | 2 refills | Status: DC

## 2019-12-27 MED ORDER — FUROSEMIDE 80 MG PO TABS: 80.0000 mg | ORAL_TABLET | Freq: Two times a day (BID) | ORAL | 2 refills | Status: DC

## 2019-12-27 NOTE — [place] Notes (Signed)
Refills

## 2019-12-27 NOTE — Congregational Nurse Program (Signed)
Saw Natasha Long at [name] clinic this afternoon, requesting medication refill. Waiting appointment with [name], Natasha Long will be notified. Medications will be pickup by CN at [name]
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
## 2020-01-30 ENCOUNTER — Encounter: Payer: Self-pay | Admitting: Critical Care Medicine

## 2020-01-30 ENCOUNTER — Other Ambulatory Visit: Payer: Self-pay

## 2020-01-30 ENCOUNTER — Ambulatory Visit: Payer: Medicaid Other | Attending: Critical Care Medicine | Admitting: Critical Care Medicine

## 2020-01-30 VITALS — BP 126/89 | HR 95 | Temp 98.1°F | Resp 18 | Ht 68.0 in | Wt 284.0 lb

## 2020-01-30 DIAGNOSIS — F1494 Cocaine use, unspecified with cocaine-induced mood disorder: Secondary | ICD-10-CM | POA: Diagnosis not present

## 2020-01-30 DIAGNOSIS — L0291 Cutaneous abscess, unspecified: Secondary | ICD-10-CM

## 2020-01-30 DIAGNOSIS — Z79899 Other long term (current) drug therapy: Secondary | ICD-10-CM | POA: Insufficient documentation

## 2020-01-30 DIAGNOSIS — F4310 Post-traumatic stress disorder, unspecified: Secondary | ICD-10-CM | POA: Diagnosis not present

## 2020-01-30 DIAGNOSIS — F3131 Bipolar disorder, current episode depressed, mild: Secondary | ICD-10-CM | POA: Insufficient documentation

## 2020-01-30 DIAGNOSIS — Z3200 Encounter for pregnancy test, result unknown: Secondary | ICD-10-CM | POA: Insufficient documentation

## 2020-01-30 DIAGNOSIS — Z8249 Family history of ischemic heart disease and other diseases of the circulatory system: Secondary | ICD-10-CM | POA: Insufficient documentation

## 2020-01-30 DIAGNOSIS — N184 Chronic kidney disease, stage 4 (severe): Secondary | ICD-10-CM | POA: Insufficient documentation

## 2020-01-30 DIAGNOSIS — L02512 Cutaneous abscess of left hand: Secondary | ICD-10-CM | POA: Insufficient documentation

## 2020-01-30 DIAGNOSIS — I129 Hypertensive chronic kidney disease with stage 1 through stage 4 chronic kidney disease, or unspecified chronic kidney disease: Secondary | ICD-10-CM | POA: Diagnosis present

## 2020-01-30 DIAGNOSIS — N189 Chronic kidney disease, unspecified: Secondary | ICD-10-CM | POA: Insufficient documentation

## 2020-01-30 DIAGNOSIS — I10 Essential (primary) hypertension: Secondary | ICD-10-CM

## 2020-01-30 DIAGNOSIS — F603 Borderline personality disorder: Secondary | ICD-10-CM | POA: Diagnosis not present

## 2020-01-30 DIAGNOSIS — N049 Nephrotic syndrome with unspecified morphologic changes: Secondary | ICD-10-CM | POA: Diagnosis not present

## 2020-01-30 DIAGNOSIS — F17210 Nicotine dependence, cigarettes, uncomplicated: Secondary | ICD-10-CM | POA: Diagnosis not present

## 2020-01-30 DIAGNOSIS — N051 Unspecified nephritic syndrome with focal and segmental glomerular lesions: Secondary | ICD-10-CM

## 2020-01-30 DIAGNOSIS — Z1159 Encounter for screening for other viral diseases: Secondary | ICD-10-CM

## 2020-01-30 LAB — POCT URINALYSIS DIP (CLINITEK)
Bilirubin, UA: NEGATIVE
Glucose, UA: 250 mg/dL — AB
Leukocytes, UA: NEGATIVE
Nitrite, UA: NEGATIVE
POC PROTEIN,UA: >=300 — AB
Spec Grav, UA: 1.025 (ref 1.010–1.025)
Urobilinogen, UA: 0.2 E.U./dL
pH, UA: 7 (ref 5.0–8.0)

## 2020-01-30 LAB — POCT URINE PREGNANCY: Preg Test, Ur: NEGATIVE

## 2020-01-30 MED ORDER — FUROSEMIDE 80 MG PO TABS: 80.0000 mg | ORAL_TABLET | Freq: Two times a day (BID) | ORAL | 2 refills | Status: DC

## 2020-01-30 MED ORDER — GABAPENTIN 600 MG PO TABS: 300.0000 mg | ORAL_TABLET | Freq: Three times a day (TID) | ORAL | 2 refills | Status: DC

## 2020-01-30 MED ORDER — LABETALOL HCL 200 MG PO TABS: 200.0000 mg | ORAL_TABLET | Freq: Every day | ORAL | 2 refills | Status: DC

## 2020-01-30 MED ORDER — ONDANSETRON HCL 4 MG PO TABS: 4.0000 mg | ORAL_TABLET | Freq: Three times a day (TID) | ORAL | 0 refills | Status: DC | PRN

## 2020-01-30 MED ORDER — CEPHALEXIN 500 MG PO CAPS: 500.0000 mg | ORAL_CAPSULE | Freq: Three times a day (TID) | ORAL | 0 refills | Status: AC

## 2020-01-30 MED ORDER — DULOXETINE HCL 20 MG PO CPEP: 20.0000 mg | ORAL_CAPSULE | Freq: Every day | ORAL | 2 refills | Status: DC

## 2020-01-30 NOTE — Assessment & Plan Note (Signed)
Chronic kidney disease stage IV we will need to reassess renal function at this visit note urinalysis today shows excess proteinuria and renal biopsy shows focal sclerosing glomerulonephritis  The patient has an upcoming visit with nephrology and is encouraged to maintain this  We will give Zofran as needed for nausea

## 2020-01-30 NOTE — Assessment & Plan Note (Signed)
Continue furosemide as per chronic kidney disease assessment

## 2020-01-30 NOTE — Patient Instructions (Addendum)
Begin Keflex 1 capsule 3 times a day for your skin infection on your finger  Refills on all of your medications sent to our pharmacy and also Zofran sent for nausea  Labs today include a metabolic profile blood count thyroid profile hepatitis C level ANA and pregnancy study   We did an incision and drainage of your left index finger palmar surface  Return in follow-up with Dr. Joya Gaskins 6 weeks and keep your planned appointment with nephrology the kidney doctor

## 2020-01-30 NOTE — Assessment & Plan Note (Signed)
History of cocaine use with cocaine induced mood disorder currently she appears to be free of cocaine use

## 2020-01-30 NOTE — Assessment & Plan Note (Signed)
As per chronic kidney disease assessment

## 2020-01-30 NOTE — Assessment & Plan Note (Signed)
Chronic hypertension currently well controlled on the labetalol and will adjust dose to 200 mg daily and continue furosemide 80 mg twice daily

## 2020-01-30 NOTE — [place] Notes (Signed)
Here for biopsy results/ Pt stated she is very scared of the results/

## 2020-01-30 NOTE — Assessment & Plan Note (Signed)
Abscess on the palmar aspect of the end of the left index finger which was drained with purulent material forthcoming and dressing applied  Plan will be to prescribe Keflex 500 mg 3 times daily for 7 days

## 2020-01-31 LAB — COMPREHENSIVE METABOLIC PANEL
ALT: 14 IU/L (ref 0–32)
AST: 21 IU/L (ref 0–40)
Albumin/Globulin Ratio: 0.9 — ABNORMAL LOW (ref 1.2–2.2)
Albumin: 2.5 g/dL — ABNORMAL LOW (ref 3.9–5.0)
Alkaline Phosphatase: 75 IU/L (ref 39–117)
BUN/Creatinine Ratio: 6 — ABNORMAL LOW (ref 9–23)
BUN: 16 mg/dL (ref 6–20)
Bilirubin Total: <0.2 mg/dL (ref 0.0–1.2)
CO2: 24 mmol/L (ref 20–29)
Calcium: 9 mg/dL (ref 8.7–10.2)
Chloride: 105 mmol/L (ref 96–106)
Creatinine, Ser: 2.68 mg/dL — ABNORMAL HIGH (ref 0.57–1.00)
GFR calc Af Amer: 27 mL/min/{1.73_m2} — ABNORMAL LOW (ref >59)
GFR calc non Af Amer: 24 mL/min/{1.73_m2} — ABNORMAL LOW (ref >59)
Globulin, Total: 2.7 g/dL (ref 1.5–4.5)
Glucose: 83 mg/dL (ref 65–99)
Potassium: 4.5 mmol/L (ref 3.5–5.2)
Sodium: 141 mmol/L (ref 134–144)
Total Protein: 5.2 g/dL — ABNORMAL LOW (ref 6.0–8.5)

## 2020-01-31 LAB — THYROID PANEL WITH TSH
Free Thyroxine Index: 2.2 (ref 1.2–4.9)
T3 Uptake Ratio: 34 % (ref 24–39)
T4, Total: 6.6 ug/dL (ref 4.5–12.0)
TSH: 3.14 u[IU]/mL (ref 0.450–4.500)

## 2020-01-31 LAB — SEDIMENTATION RATE: Sed Rate: 47 mm/hr — ABNORMAL HIGH (ref 0–32)

## 2020-01-31 LAB — HEPATITIS C ANTIBODY (REFLEX): HCV Ab: <0.1 s/co ratio (ref 0.0–0.9)

## 2020-01-31 LAB — ANA: Anti Nuclear Antibody (ANA): NEGATIVE

## 2020-01-31 LAB — HCV COMMENT:

## 2020-02-26 ENCOUNTER — Telehealth: Payer: Self-pay

## 2020-02-26 NOTE — Telephone Encounter (Signed)
Patient would like a call back she states that she is having symptoms and would like to speak with nurse

## 2020-02-28 ENCOUNTER — Telehealth: Payer: Self-pay | Admitting: *Deleted

## 2020-02-28 ENCOUNTER — Telehealth: Payer: Self-pay | Admitting: Critical Care Medicine

## 2020-02-28 NOTE — Telephone Encounter (Signed)
Called patient to schedule a televisit with Dr. Joya Gaskins. No voice mail was set up.

## 2020-02-28 NOTE — Telephone Encounter (Signed)
Please schedule patient for tele-visit with Joya Gaskins. MA explained to patient, that I was with the provider and was trying to document the concern and complete a tele visit today. Patient talked over MA and continued to ask for an appt, MA informed patient of front office scheduling her.

## 2020-02-28 NOTE — Telephone Encounter (Signed)
Patient verified DOB MA spoke with patient and encouraged her to share any concerns when leaving a message for a provider. This allows the clinical staff to assist her in a timely manner by having pertinent information regarding the concern. Patient requested to have an Tele-visit with speak with Dr. Joya Gaskins patient.  Patient informed of message being routed to front office for scheduling.

## 2020-02-29 ENCOUNTER — Encounter: Payer: Self-pay | Admitting: Critical Care Medicine

## 2020-02-29 ENCOUNTER — Ambulatory Visit: Payer: Medicaid Other | Attending: Critical Care Medicine | Admitting: Critical Care Medicine

## 2020-02-29 ENCOUNTER — Other Ambulatory Visit: Payer: Self-pay

## 2020-02-29 ENCOUNTER — Other Ambulatory Visit (HOSPITAL_COMMUNITY)
Admission: RE | Admit: 2020-02-29 | Discharge: 2020-02-29 | Disposition: A | Discharge status: Home / Self Care | Payer: Medicaid Other | Source: Ambulatory Visit | Attending: Critical Care Medicine | Admitting: Critical Care Medicine

## 2020-02-29 DIAGNOSIS — F3131 Bipolar disorder, current episode depressed, mild: Secondary | ICD-10-CM

## 2020-02-29 DIAGNOSIS — N049 Nephrotic syndrome with unspecified morphologic changes: Secondary | ICD-10-CM

## 2020-02-29 DIAGNOSIS — N184 Chronic kidney disease, stage 4 (severe): Secondary | ICD-10-CM

## 2020-02-29 DIAGNOSIS — A549 Gonococcal infection, unspecified: Secondary | ICD-10-CM | POA: Insufficient documentation

## 2020-02-29 DIAGNOSIS — F603 Borderline personality disorder: Secondary | ICD-10-CM

## 2020-02-29 DIAGNOSIS — I10 Essential (primary) hypertension: Secondary | ICD-10-CM

## 2020-02-29 DIAGNOSIS — F1410 Cocaine abuse, uncomplicated: Secondary | ICD-10-CM | POA: Diagnosis not present

## 2020-02-29 DIAGNOSIS — L02818 Cutaneous abscess of other sites: Secondary | ICD-10-CM | POA: Diagnosis not present

## 2020-02-29 DIAGNOSIS — F4310 Post-traumatic stress disorder, unspecified: Secondary | ICD-10-CM

## 2020-02-29 DIAGNOSIS — F1220 Cannabis dependence, uncomplicated: Secondary | ICD-10-CM

## 2020-02-29 DIAGNOSIS — F332 Major depressive disorder, recurrent severe without psychotic features: Secondary | ICD-10-CM

## 2020-02-29 DIAGNOSIS — F1494 Cocaine use, unspecified with cocaine-induced mood disorder: Secondary | ICD-10-CM

## 2020-02-29 MED ORDER — LABETALOL HCL 200 MG PO TABS: 100.0000 mg | ORAL_TABLET | Freq: Every day | ORAL | 2 refills | Status: DC

## 2020-02-29 MED ORDER — BLOOD PRESSURE MONITOR 7 DEVI: 1.0000 [IU] | Freq: Every day | 0 refills | Status: DC

## 2020-02-29 NOTE — Assessment & Plan Note (Signed)
The abscess on the left index finger appears to have recurred and it appears to be in multiple sites but I was not able to visualize this at this visit as this was a phone visit  The patient raises the question about recurrence and gonorrhea symptoms given the pain in the pelvis and the abscesses on the skin I will obtain for the patient a cervical vaginal swab today in the clinic to check for sexual transmitted diseases and may need to give consideration to a Neisseria gonorrhea resistant antibiotic

## 2020-02-29 NOTE — Assessment & Plan Note (Signed)
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
## 2020-02-29 NOTE — Assessment & Plan Note (Signed)
Bipolar disorder with borderline personality disorder syndrome again the patient is encouraged to make an appointment with [facility] for further follow-up

## 2020-02-29 NOTE — Assessment & Plan Note (Signed)
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  She has a history of bipolar disorder not well treated and she states when she becomes very stressed and agitated this is what prompts the marijuana and cocaine use.  She would like to get back on Abilify and I have encouraged her to seek therapy at the [facility] and she states she will do so she had requested I give her the Abilify injectable and I said we do not have that service at our clinic she needs to go to [facility] for this

## 2020-02-29 NOTE — Assessment & Plan Note (Signed)
Chronic hypertension with questionable control with as needed use of labetalol only.  I counseled the patient that she needs to take her labetalol on a daily basis and we will reduce the dose to 100 mg daily she will break the 200 mg tablets in half for this.  I am also going obtain for the patient a home blood pressure monitoring device that she can get for free through Medicaid.  She is encouraged to follow-up with this question with her nephrology visit as well

## 2020-03-07 LAB — CERVICOVAGINAL ANCILLARY ONLY
Bacterial Vaginitis (gardnerella): NEGATIVE
Candida Glabrata: NEGATIVE
Candida Vaginitis: NEGATIVE
Chlamydia: NEGATIVE
Comment: NEGATIVE
Comment: NEGATIVE
Comment: NEGATIVE
Comment: NEGATIVE
Comment: NEGATIVE
Comment: NORMAL
Neisseria Gonorrhea: NEGATIVE
Trichomonas: NEGATIVE

## 2020-03-11 ENCOUNTER — Other Ambulatory Visit: Payer: Self-pay

## 2020-03-11 ENCOUNTER — Other Ambulatory Visit (HOSPITAL_COMMUNITY)
Admission: RE | Admit: 2020-03-11 | Discharge: 2020-03-11 | Disposition: A | Discharge status: Home / Self Care | Payer: Medicaid Other | Source: Ambulatory Visit | Attending: Critical Care Medicine | Admitting: Critical Care Medicine

## 2020-03-11 ENCOUNTER — Ambulatory Visit: Payer: Medicaid Other | Attending: Critical Care Medicine | Admitting: *Deleted

## 2020-03-11 DIAGNOSIS — Z113 Encounter for screening for infections with a predominantly sexual mode of transmission: Secondary | ICD-10-CM | POA: Diagnosis present

## 2020-03-12 LAB — CERVICOVAGINAL ANCILLARY ONLY
Bacterial Vaginitis (gardnerella): POSITIVE — AB
Candida Glabrata: NEGATIVE
Candida Vaginitis: POSITIVE — AB
Chlamydia: NEGATIVE
Comment: NEGATIVE
Comment: NEGATIVE
Comment: NEGATIVE
Comment: NEGATIVE
Comment: NEGATIVE
Comment: NORMAL
Neisseria Gonorrhea: NEGATIVE
Trichomonas: NEGATIVE

## 2020-03-13 ENCOUNTER — Telehealth: Payer: Self-pay | Admitting: *Deleted

## 2020-03-13 ENCOUNTER — Other Ambulatory Visit: Payer: Self-pay | Admitting: Family Medicine

## 2020-03-13 MED ORDER — FLUCONAZOLE 150 MG PO TABS: 150.0000 mg | ORAL_TABLET | Freq: Once | ORAL | 0 refills | Status: AC

## 2020-03-13 MED ORDER — METRONIDAZOLE 500 MG PO TABS: 500.0000 mg | ORAL_TABLET | Freq: Two times a day (BID) | ORAL | 0 refills | Status: DC

## 2020-03-13 NOTE — Telephone Encounter (Signed)
Natasha Long from [facility] called to inform that the charges from the test done on 02/29/2020 will not be applied to patient's account since there was not a test sample to run.

## 2020-07-03 ENCOUNTER — Encounter: Payer: Self-pay | Admitting: *Deleted

## 2020-07-03 ENCOUNTER — Telehealth: Payer: Self-pay | Admitting: Critical Care Medicine

## 2020-07-03 NOTE — Telephone Encounter (Signed)
Her A1C was normal in 2020   I do not see any dx for prediabetes in her chart

## 2020-07-03 NOTE — Telephone Encounter (Signed)
Please advise.   Copied from [place] [phone]. Topic: General - Call Back - No Documentation >> Jul 03, 2020  3:16 PM Erick Blinks wrote: Reason for CRM: Pt wants to speak to PCP or nurse, regarding being diagnosed as prediabetic even though no one has ever discussed this with her before. Please advise Best contact: [phone]

## 2020-07-03 NOTE — Congregational Nurse Program (Signed)
°  Dept: [place] Nurse Program Note  Date of Encounter: 07/03/2020  Past Medical History: Past Medical History:  Diagnosis Date   Bipolar affective, manic ([place])    Hypertension    diagnosed as child; stopped meds at 25 yo   Hypoglycemia    Insomnia    Nephrotic syndrome    Nephrotic syndrome    PTSD (post-traumatic stress disorder)    Renal disease     Encounter Details:  CNP Questionnaire - 07/03/20 1403      Questionnaire   Patient Status Not Applicable    Race Black or African American    Location Patient Served At [employer]    Uninsured Not Applicable    Food No food insecurities    Housing/Utilities Yes, have permanent housing    Transportation No transportation needs    Interpersonal Safety Yes, feel physically and emotionally safe where you currently live    Medication Yes, have medication insecurities    Medical Provider Yes    Referrals Not Applicable    ED Visit Averted Not Applicable    Life-Saving Intervention Made Not Applicable          Pt came to [facility] asking for help with her medication. She has been out of medication a few days. Pt declined to have her blood pressure taken. She sees Dr. Joya Gaskins. Pt has been low on money unable to pay for medication. Pt will get twenty dollars this evening and has prescriptions. She goes to [facility]. Medication should cost $15.00. Explained she could ask for a waiver. Pt has medicaid. Pt has an interview tomorrow. Pt would like to have her gabapentin increased. Request sent to Dr. Joya Gaskins. Bernita Raisin. RN CN [phone]

## 2020-07-04 NOTE — Telephone Encounter (Signed)
Pt called back, office on other calls.  She will have her phone with her .  Cell number is the best number

## 2020-07-04 NOTE — Telephone Encounter (Signed)
Called pt unable to reach/ Left voice message to call back. Name and phone nr provided.

## 2020-07-04 NOTE — Telephone Encounter (Signed)
Called pt made aware of the above MD message. Verbalized understanding

## 2020-07-14 ENCOUNTER — Emergency Department (HOSPITAL_COMMUNITY)
Admission: EM | Admit: 2020-07-14 | Discharge: 2020-07-14 | Discharge status: Absent without leave | Payer: Medicaid Other

## 2020-08-21 ENCOUNTER — Other Ambulatory Visit: Payer: Self-pay

## 2020-08-21 DIAGNOSIS — N184 Chronic kidney disease, stage 4 (severe): Secondary | ICD-10-CM

## 2020-08-24 ENCOUNTER — Other Ambulatory Visit: Payer: Self-pay | Admitting: Critical Care Medicine

## 2020-08-24 NOTE — Telephone Encounter (Signed)
Requested medication (s) are due for refill today: yes  Requested medication (s) are on the active medication list: yes  Last refill:  01/30/20  Future visit scheduled: no  Notes to clinic:  prescription expired 04/29/20   Requested Prescriptions  Pending Prescriptions Disp Refills   gabapentin (NEURONTIN) 600 MG tablet [Pharmacy Med Name: gabapentin 600 mg tablet] 45 tablet 2    Sig: Take 0.5 tablets (300 mg total) by mouth 3 (three) times daily.      Neurology: Anticonvulsants - gabapentin Passed - 08/24/2020 11:37 AM      Passed - Valid encounter within last 12 months    Recent Outpatient Visits           5 months ago Cutaneous abscess of other site   [place] [name], MD   6 months ago CKD (chronic kidney disease) stage 4, GFR 15-29 ml/min [facility])   [place] [name], MD   1 year ago Right foot pain   [place] Kerin Perna, NP   2 years ago Bipolar disorder, current episode mixed, severe, with psychotic features [facility])   [place], Roger David, PA-C   2 years ago Depression with anxiety   [place], Roger David, PA-C

## 2020-09-02 ENCOUNTER — Encounter (HOSPITAL_COMMUNITY): Payer: Medicaid Other

## 2020-09-02 ENCOUNTER — Other Ambulatory Visit (HOSPITAL_COMMUNITY): Payer: Medicaid Other

## 2020-09-02 ENCOUNTER — Encounter: Payer: Medicaid Other | Admitting: Surgery

## 2020-09-30 ENCOUNTER — Other Ambulatory Visit: Payer: Self-pay

## 2020-09-30 ENCOUNTER — Encounter: Payer: Self-pay | Admitting: Surgery

## 2020-09-30 ENCOUNTER — Ambulatory Visit (HOSPITAL_COMMUNITY)
Admission: RE | Admit: 2020-09-30 | Discharge: 2020-09-30 | Disposition: A | Discharge status: Home / Self Care | Payer: Medicaid Other | Source: Ambulatory Visit | Attending: Surgery | Admitting: Surgery

## 2020-09-30 ENCOUNTER — Ambulatory Visit (INDEPENDENT_AMBULATORY_CARE_PROVIDER_SITE_OTHER): Payer: Medicaid Other | Admitting: Surgery

## 2020-09-30 ENCOUNTER — Ambulatory Visit (INDEPENDENT_AMBULATORY_CARE_PROVIDER_SITE_OTHER)
Admission: RE | Admit: 2020-09-30 | Discharge: 2020-09-30 | Disposition: A | Discharge status: Home / Self Care | Payer: Medicaid Other | Source: Ambulatory Visit | Attending: Surgery | Admitting: Surgery

## 2020-09-30 VITALS — BP 113/79 | HR 69 | Temp 97.5°F | Resp 20 | Ht 68.0 in | Wt 290.0 lb

## 2020-09-30 DIAGNOSIS — N185 Chronic kidney disease, stage 5: Secondary | ICD-10-CM | POA: Diagnosis not present

## 2020-09-30 DIAGNOSIS — N184 Chronic kidney disease, stage 4 (severe): Secondary | ICD-10-CM | POA: Diagnosis not present

## 2020-09-30 NOTE — H&P (View-Only) (Signed)
Vascular and Vein Specialist of [facility]  Patient name: Natasha Long MRN: [id number] DOB: [birth year] Sex: female   REQUESTING PROVIDER:    Dr. Johnney Ou   REASON FOR CONSULT:    Dialysis acccess   HISTORY OF PRESENT ILLNESS:   Natasha Long is a 24 y.o. female, who is referred for evaluation of dialysis access.  The patient is right-handed.  Her renal failure secondary to nephrotic syndrome.  She also has a history of hypertension as well as prediabetes.  She denies any history of pacemaker or device implant on the left chest  PAST MEDICAL HISTORY    Past Medical History:  Diagnosis Date  . Bipolar affective, manic ([place])   . Hypertension    diagnosed as child; stopped meds at 72 yo  . Hypoglycemia   . Insomnia   . Nephrotic syndrome   . Nephrotic syndrome   . PTSD (post-traumatic stress disorder)   . Renal disease      FAMILY HISTORY   Family History  Problem Relation Age of Onset  . Diabetes Other   . Hypertension Other     SOCIAL HISTORY:   Social History   Socioeconomic History  . Marital status: Single    Spouse name: Not on file  . Number of children: 1  . Years of education: Not on file  . Highest education level: 12th grade  Occupational History  . Not on file  Tobacco Use  . Smoking status: Current Every Day Smoker    Packs/day: 0.25    Types: Cigarettes  . Smokeless tobacco: Never Used  Vaping Use  . Vaping Use: Never used  Substance and Sexual Activity  . Alcohol use: No  . Drug use: Yes    Types: Marijuana, Cocaine  . Sexual activity: Yes  Other Topics Concern  . Not on file  Social History Narrative  . Not on file   Social Determinants of Health   Financial Resource Strain:   . Difficulty of Paying Living Expenses: Not on file  Food Insecurity:   . Worried About Charity fundraiser in the Last Year: Not on file  . Ran Out of Food in the Last Year: Not on file  Transportation Needs:    . Lack of Transportation (Medical): Not on file  . Lack of Transportation (Non-Medical): Not on file  Physical Activity:   . Days of Exercise per Week: Not on file  . Minutes of Exercise per Session: Not on file  Stress:   . Feeling of Stress : Not on file  Social Connections:   . Frequency of Communication with Friends and Family: Not on file  . Frequency of Social Gatherings with Friends and Family: Not on file  . Attends Religious Services: Not on file  . Active Member of Clubs or Organizations: Not on file  . Attends Archivist Meetings: Not on file  . Marital Status: Not on file  Intimate Partner Violence:   . Fear of Current or Ex-Partner: Not on file  . Emotionally Abused: Not on file  . Physically Abused: Not on file  . Sexually Abused: Not on file    ALLERGIES:    Allergies  Allergen Reactions  . Prednisone Other (See Comments)    Pt states that this med caused pancreatitis.   . Prozac [Fluoxetine Hcl] Other (See Comments)    Panic attack   . Wellbutrin [Bupropion] Other (See Comments)    Panic attack    CURRENT MEDICATIONS:  Current Outpatient Medications  Medication Sig Dispense Refill  . gabapentin (NEURONTIN) 600 MG tablet Take 0.5 tablets (300 mg total) by mouth 3 (three) times daily. 45 tablet 2  . Blood Pressure Monitoring (BLOOD PRESSURE MONITOR 7) DEVI 1 Units by Does not apply route daily. Measure blood pressure daily 1 each 0  . DULoxetine (CYMBALTA) 20 MG capsule Take 1 capsule (20 mg total) by mouth daily. 30 capsule 2  . furosemide (LASIX) 80 MG tablet Take 1 tablet (80 mg total) by mouth 2 (two) times daily. (Patient taking differently: Take 80 mg by mouth continuous as needed. ) 60 tablet 2  . hydrOXYzine (ATARAX/VISTARIL) 50 MG tablet Take 50 mg by mouth 3 (three) times daily as needed for anxiety.    Marland Kitchen labetalol (NORMODYNE) 200 MG tablet Take 0.5 tablets (100 mg total) by mouth daily. 15 tablet 2  . metroNIDAZOLE (FLAGYL) 500 MG  tablet Take 1 tablet (500 mg total) by mouth 2 (two) times daily. 14 tablet 0  . ondansetron (ZOFRAN) 4 MG tablet Take 1 tablet (4 mg total) by mouth every 8 (eight) hours as needed for nausea or vomiting. 30 tablet 0  . Potassium 99 MG TABS Take 3 tablets by mouth daily as needed.    . traZODone (DESYREL) 100 MG tablet Take 50-100 mg by mouth at bedtime as needed for sleep.     No current facility-administered medications for this visit.    REVIEW OF SYSTEMS:   [X]  denotes positive finding, [ ]  denotes negative finding Cardiac  Comments:  Chest pain or chest pressure:    Shortness of breath upon exertion:    Short of breath when lying flat:    Irregular heart rhythm:        Vascular    Pain in calf, thigh, or hip brought on by ambulation:    Pain in feet at night that wakes you up from your sleep:     Blood clot in your veins:    Leg swelling:         Pulmonary    Oxygen at home:    Productive cough:     Wheezing:         Neurologic    Sudden weakness in arms or legs:     Sudden numbness in arms or legs:     Sudden onset of difficulty speaking or slurred speech:    Temporary loss of vision in one eye:     Problems with dizziness:         Gastrointestinal    Blood in stool:      Vomited blood:         Genitourinary    Burning when urinating:     Blood in urine:        Psychiatric    Major depression:         Hematologic    Bleeding problems:    Problems with blood clotting too easily:        Skin    Rashes or ulcers:        Constitutional    Fever or chills:     PHYSICAL EXAM:   Vitals:   09/30/20 1458  BP: 113/79  Pulse: 69  Resp: 20  Temp: (!) 97.5 F (36.4 C)  SpO2: 99%  Weight: 290 lb (131.5 kg)  Height: 5\' 8"  (1.727 m)    GENERAL: The patient is a well-nourished female, in no acute distress. The vital signs are documented above. CARDIAC: There  is a regular rate and rhythm.  VASCULAR: Palpable left radial and brachial pulse PULMONARY:  Nonlabored respirations MUSCULOSKELETAL: There are no major deformities or cyanosis. NEUROLOGIC: No focal weakness or paresthesias are detected. SKIN: There are no ulcers or rashes noted. PSYCHIATRIC: The patient has a normal affect.  STUDIES:   I have reviewed the following: Arterial: Triphasic waveforms +-----------------+-------------+----------+--------+  Right Cephalic  Diameter (cm)Depth (cm)Findings  +-----------------+-------------+----------+--------+  Shoulder       0.38              +-----------------+-------------+----------+--------+  Prox upper arm    0.35              +-----------------+-------------+----------+--------+  Mid upper arm    0.43              +-----------------+-------------+----------+--------+  Dist upper arm    0.47              +-----------------+-------------+----------+--------+  Antecubital fossa  0.62              +-----------------+-------------+----------+--------+  Prox forearm     0.30              +-----------------+-------------+----------+--------+  Mid forearm     0.29              +-----------------+-------------+----------+--------+  Dist forearm     0.19              +-----------------+-------------+----------+--------+  Wrist        0.18              +-----------------+-------------+----------+--------+   +-----------------+-------------+----------+--------------+  Right Basilic  Diameter (cm)Depth (cm)  Findings    +-----------------+-------------+----------+--------------+  Shoulder                 not visualized  +-----------------+-------------+----------+--------------+  Prox upper arm              not visualized  +-----------------+-------------+----------+--------------+  Mid upper arm     0.36                 +-----------------+-------------+----------+--------------+  Dist upper arm    0.37                 +-----------------+-------------+----------+--------------+  Antecubital fossa  0.27                 +-----------------+-------------+----------+--------------+  Prox forearm     0.27                 +-----------------+-------------+----------+--------------+   +-----------------+-------------+----------+--------+  Left Cephalic  Diameter (cm)Depth (cm)Findings  +-----------------+-------------+----------+--------+  Shoulder       0.44              +-----------------+-------------+----------+--------+  Prox upper arm    0.32              +-----------------+-------------+----------+--------+  Mid upper arm    0.46              +-----------------+-------------+----------+--------+  Dist upper arm    0.74              +-----------------+-------------+----------+--------+  Antecubital fossa  0.38              +-----------------+-------------+----------+--------+  Prox forearm     0.26              +-----------------+-------------+----------+--------+  Mid forearm     0.25              +-----------------+-------------+----------+--------+  Dist forearm    0.20 / 0.28            +-----------------+-------------+----------+--------+   +-----------------+-------------+----------+--------+  Left Basilic   Diameter (cm)Depth (cm)Findings  +-----------------+-------------+----------+--------+  Prox upper arm    0.50              +-----------------+-------------+----------+--------+  Mid upper arm    0.43              +-----------------+-------------+----------+--------+  Dist upper  arm    0.31              +-----------------+-------------+----------+--------+  Antecubital fossa 0.31 / 0.27            +-----------------+-------------+----------+--------+  Prox forearm    0.33 / 0.33            +-----------------+-------------+----------+--------+  ASSESSMENT and PLAN   CKD 5: I discussed proceeding with a left brachiocephalic fistula.  We discussed the risks and benefits of the operation including the risk of not maturity, the need for future interventions, and steal syndrome.  She understands that she will likely need a second procedure for fistula elevation.  All of her questions were answered.  She is going to need a 2-week notice before scheduling this for her work.  We will try to get this done in the immediate future.   Leia Alf, MD, FACS [facility] of [facility] [phone] Pager [phone]

## 2020-09-30 NOTE — Progress Notes (Signed)
Vascular and Vein Specialist of [facility]  Patient name: Natasha Long MRN: [id number] DOB: May 27, 1996 Sex: female   REQUESTING PROVIDER:    Dr. Johnney Ou   REASON FOR CONSULT:    Dialysis acccess   HISTORY OF PRESENT ILLNESS:   Natasha Long is a 24 y.o. female, who is referred for evaluation of dialysis access.  The patient is right-handed.  Her renal failure secondary to nephrotic syndrome.  She also has a history of hypertension as well as prediabetes.  She denies any history of pacemaker or device implant on the left chest  PAST MEDICAL HISTORY    Past Medical History:  Diagnosis Date  . Bipolar affective, manic ([place])   . Hypertension    diagnosed as child; stopped meds at 46 yo  . Hypoglycemia   . Insomnia   . Nephrotic syndrome   . Nephrotic syndrome   . PTSD (post-traumatic stress disorder)   . Renal disease      FAMILY HISTORY   Family History  Problem Relation Age of Onset  . Diabetes Other   . Hypertension Other     SOCIAL HISTORY:   Social History   Socioeconomic History  . Marital status: Single    Spouse name: Not on file  . Number of children: 1  . Years of education: Not on file  . Highest education level: 12th grade  Occupational History  . Not on file  Tobacco Use  . Smoking status: Current Every Day Smoker    Packs/day: 0.25    Types: Cigarettes  . Smokeless tobacco: Never Used  Vaping Use  . Vaping Use: Never used  Substance and Sexual Activity  . Alcohol use: No  . Drug use: Yes    Types: Marijuana, Cocaine  . Sexual activity: Yes  Other Topics Concern  . Not on file  Social History Narrative  . Not on file   Social Determinants of Health   Financial Resource Strain:   . Difficulty of Paying Living Expenses: Not on file  Food Insecurity:   . Worried About Charity fundraiser in the Last Year: Not on file  . Ran Out of Food in the Last Year: Not on file  Transportation Needs:    . Lack of Transportation (Medical): Not on file  . Lack of Transportation (Non-Medical): Not on file  Physical Activity:   . Days of Exercise per Week: Not on file  . Minutes of Exercise per Session: Not on file  Stress:   . Feeling of Stress : Not on file  Social Connections:   . Frequency of Communication with Friends and Family: Not on file  . Frequency of Social Gatherings with Friends and Family: Not on file  . Attends Religious Services: Not on file  . Active Member of Clubs or Organizations: Not on file  . Attends Archivist Meetings: Not on file  . Marital Status: Not on file  Intimate Partner Violence:   . Fear of Current or Ex-Partner: Not on file  . Emotionally Abused: Not on file  . Physically Abused: Not on file  . Sexually Abused: Not on file    ALLERGIES:    Allergies  Allergen Reactions  . Prednisone Other (See Comments)    Pt states that this med caused pancreatitis.   . Prozac [Fluoxetine Hcl] Other (See Comments)    Panic attack   . Wellbutrin [Bupropion] Other (See Comments)    Panic attack    CURRENT MEDICATIONS:  Current Outpatient Medications  Medication Sig Dispense Refill  . gabapentin (NEURONTIN) 600 MG tablet Take 0.5 tablets (300 mg total) by mouth 3 (three) times daily. 45 tablet 2  . Blood Pressure Monitoring (BLOOD PRESSURE MONITOR 7) DEVI 1 Units by Does not apply route daily. Measure blood pressure daily 1 each 0  . DULoxetine (CYMBALTA) 20 MG capsule Take 1 capsule (20 mg total) by mouth daily. 30 capsule 2  . furosemide (LASIX) 80 MG tablet Take 1 tablet (80 mg total) by mouth 2 (two) times daily. (Patient taking differently: Take 80 mg by mouth continuous as needed. ) 60 tablet 2  . hydrOXYzine (ATARAX/VISTARIL) 50 MG tablet Take 50 mg by mouth 3 (three) times daily as needed for anxiety.    Marland Kitchen labetalol (NORMODYNE) 200 MG tablet Take 0.5 tablets (100 mg total) by mouth daily. 15 tablet 2  . metroNIDAZOLE (FLAGYL) 500 MG  tablet Take 1 tablet (500 mg total) by mouth 2 (two) times daily. 14 tablet 0  . ondansetron (ZOFRAN) 4 MG tablet Take 1 tablet (4 mg total) by mouth every 8 (eight) hours as needed for nausea or vomiting. 30 tablet 0  . Potassium 99 MG TABS Take 3 tablets by mouth daily as needed.    . traZODone (DESYREL) 100 MG tablet Take 50-100 mg by mouth at bedtime as needed for sleep.     No current facility-administered medications for this visit.    REVIEW OF SYSTEMS:   [X]  denotes positive finding, [ ]  denotes negative finding Cardiac  Comments:  Chest pain or chest pressure:    Shortness of breath upon exertion:    Short of breath when lying flat:    Irregular heart rhythm:        Vascular    Pain in calf, thigh, or hip brought on by ambulation:    Pain in feet at night that wakes you up from your sleep:     Blood clot in your veins:    Leg swelling:         Pulmonary    Oxygen at home:    Productive cough:     Wheezing:         Neurologic    Sudden weakness in arms or legs:     Sudden numbness in arms or legs:     Sudden onset of difficulty speaking or slurred speech:    Temporary loss of vision in one eye:     Problems with dizziness:         Gastrointestinal    Blood in stool:      Vomited blood:         Genitourinary    Burning when urinating:     Blood in urine:        Psychiatric    Major depression:         Hematologic    Bleeding problems:    Problems with blood clotting too easily:        Skin    Rashes or ulcers:        Constitutional    Fever or chills:     PHYSICAL EXAM:   Vitals:   09/30/20 1458  BP: 113/79  Pulse: 69  Resp: 20  Temp: (!) 97.5 F (36.4 C)  SpO2: 99%  Weight: 290 lb (131.5 kg)  Height: 5\' 8"  (1.727 m)    GENERAL: The patient is a well-nourished female, in no acute distress. The vital signs are documented above. CARDIAC: There  is a regular rate and rhythm.  VASCULAR: Palpable left radial and brachial pulse PULMONARY:  Nonlabored respirations MUSCULOSKELETAL: There are no major deformities or cyanosis. NEUROLOGIC: No focal weakness or paresthesias are detected. SKIN: There are no ulcers or rashes noted. PSYCHIATRIC: The patient has a normal affect.  STUDIES:   I have reviewed the following: Arterial: Triphasic waveforms +-----------------+-------------+----------+--------+  Right Cephalic  Diameter (cm)Depth (cm)Findings  +-----------------+-------------+----------+--------+  Shoulder       0.38              +-----------------+-------------+----------+--------+  Prox upper arm    0.35              +-----------------+-------------+----------+--------+  Mid upper arm    0.43              +-----------------+-------------+----------+--------+  Dist upper arm    0.47              +-----------------+-------------+----------+--------+  Antecubital fossa  0.62              +-----------------+-------------+----------+--------+  Prox forearm     0.30              +-----------------+-------------+----------+--------+  Mid forearm     0.29              +-----------------+-------------+----------+--------+  Dist forearm     0.19              +-----------------+-------------+----------+--------+  Wrist        0.18              +-----------------+-------------+----------+--------+   +-----------------+-------------+----------+--------------+  Right Basilic  Diameter (cm)Depth (cm)  Findings    +-----------------+-------------+----------+--------------+  Shoulder                 not visualized  +-----------------+-------------+----------+--------------+  Prox upper arm              not visualized  +-----------------+-------------+----------+--------------+  Mid upper arm     0.36                 +-----------------+-------------+----------+--------------+  Dist upper arm    0.37                 +-----------------+-------------+----------+--------------+  Antecubital fossa  0.27                 +-----------------+-------------+----------+--------------+  Prox forearm     0.27                 +-----------------+-------------+----------+--------------+   +-----------------+-------------+----------+--------+  Left Cephalic  Diameter (cm)Depth (cm)Findings  +-----------------+-------------+----------+--------+  Shoulder       0.44              +-----------------+-------------+----------+--------+  Prox upper arm    0.32              +-----------------+-------------+----------+--------+  Mid upper arm    0.46              +-----------------+-------------+----------+--------+  Dist upper arm    0.74              +-----------------+-------------+----------+--------+  Antecubital fossa  0.38              +-----------------+-------------+----------+--------+  Prox forearm     0.26              +-----------------+-------------+----------+--------+  Mid forearm     0.25              +-----------------+-------------+----------+--------+  Dist forearm    0.20 / 0.28            +-----------------+-------------+----------+--------+   +-----------------+-------------+----------+--------+  Left Basilic   Diameter (cm)Depth (cm)Findings  +-----------------+-------------+----------+--------+  Prox upper arm    0.50              +-----------------+-------------+----------+--------+  Mid upper arm    0.43              +-----------------+-------------+----------+--------+  Dist upper  arm    0.31              +-----------------+-------------+----------+--------+  Antecubital fossa 0.31 / 0.27            +-----------------+-------------+----------+--------+  Prox forearm    0.33 / 0.33            +-----------------+-------------+----------+--------+  ASSESSMENT and PLAN   CKD 5: I discussed proceeding with a left brachiocephalic fistula.  We discussed the risks and benefits of the operation including the risk of not maturity, the need for future interventions, and steal syndrome.  She understands that she will likely need a second procedure for fistula elevation.  All of her questions were answered.  She is going to need a 2-week notice before scheduling this for her work.  We will try to get this done in the immediate future.   Leia Alf, MD, FACS [facility] of [facility] [phone] Pager [phone]

## 2020-10-17 ENCOUNTER — Other Ambulatory Visit (HOSPITAL_COMMUNITY): Payer: Medicaid Other

## 2020-10-17 ENCOUNTER — Telehealth: Payer: Self-pay

## 2020-10-17 ENCOUNTER — Encounter (HOSPITAL_COMMUNITY): Payer: Self-pay | Admitting: Surgery

## 2020-10-17 ENCOUNTER — Other Ambulatory Visit: Payer: Self-pay

## 2020-10-17 MED ORDER — DEXTROSE 5 % IV SOLN
3.0000 g | INTRAVENOUS | Status: DC
  Filled 2020-10-17: qty 3000

## 2020-10-17 NOTE — Telephone Encounter (Signed)
Pt contacted office today stating she didn't have transportation today to Covid testing site and inquired if possible to have test completed day of procedure. Spoke with nurse at [facility] regarding situation who verbalized this would be okay and for pt to arrive 4 hours early to allow for testing without procedure delay.   Informed patient Covid test approved to be completed same day of surgery and she will need to arrive around 0530-0600 am for testing and to expect a wait prior to procedure start. Pt verbalized understanding.

## 2020-10-17 NOTE — Progress Notes (Signed)
Natasha Long denies chest pain or shortness of breath. Patient denies any s/s of Covid for her or anyone she has been in contact with, to her knowledge.  Patient did not have transportation to have Covid test done today, office told Natasha Bonus to arrive at [facility] at 0600  for Covid test.

## 2020-10-18 ENCOUNTER — Encounter (HOSPITAL_COMMUNITY)
Admission: RE | Disposition: A | Discharge status: Home / Self Care | Payer: Self-pay | Source: Home / Self Care | Attending: Surgery

## 2020-10-18 ENCOUNTER — Ambulatory Visit (HOSPITAL_COMMUNITY): Payer: Medicaid Other | Admitting: Anesthesiology

## 2020-10-18 ENCOUNTER — Encounter (HOSPITAL_COMMUNITY): Payer: Self-pay | Admitting: Surgery

## 2020-10-18 ENCOUNTER — Ambulatory Visit (HOSPITAL_COMMUNITY)
Admission: RE | Admit: 2020-10-18 | Discharge: 2020-10-18 | Disposition: A | Discharge status: Home / Self Care | Payer: Medicaid Other | Attending: Surgery | Admitting: Surgery

## 2020-10-18 DIAGNOSIS — Z79899 Other long term (current) drug therapy: Secondary | ICD-10-CM | POA: Diagnosis not present

## 2020-10-18 DIAGNOSIS — Z833 Family history of diabetes mellitus: Secondary | ICD-10-CM | POA: Insufficient documentation

## 2020-10-18 DIAGNOSIS — Z888 Allergy status to other drugs, medicaments and biological substances status: Secondary | ICD-10-CM | POA: Diagnosis not present

## 2020-10-18 DIAGNOSIS — R7303 Prediabetes: Secondary | ICD-10-CM | POA: Diagnosis not present

## 2020-10-18 DIAGNOSIS — N184 Chronic kidney disease, stage 4 (severe): Secondary | ICD-10-CM | POA: Diagnosis present

## 2020-10-18 DIAGNOSIS — F319 Bipolar disorder, unspecified: Secondary | ICD-10-CM | POA: Insufficient documentation

## 2020-10-18 DIAGNOSIS — F4310 Post-traumatic stress disorder, unspecified: Secondary | ICD-10-CM | POA: Insufficient documentation

## 2020-10-18 DIAGNOSIS — F17210 Nicotine dependence, cigarettes, uncomplicated: Secondary | ICD-10-CM | POA: Diagnosis not present

## 2020-10-18 DIAGNOSIS — I129 Hypertensive chronic kidney disease with stage 1 through stage 4 chronic kidney disease, or unspecified chronic kidney disease: Secondary | ICD-10-CM | POA: Insufficient documentation

## 2020-10-18 DIAGNOSIS — Z8249 Family history of ischemic heart disease and other diseases of the circulatory system: Secondary | ICD-10-CM | POA: Diagnosis not present

## 2020-10-18 DIAGNOSIS — Z6841 Body Mass Index (BMI) 40.0 and over, adult: Secondary | ICD-10-CM | POA: Insufficient documentation

## 2020-10-18 DIAGNOSIS — Z20822 Contact with and (suspected) exposure to covid-19: Secondary | ICD-10-CM | POA: Insufficient documentation

## 2020-10-18 HISTORY — DX: Unspecified asthma, uncomplicated: J45.909

## 2020-10-18 HISTORY — DX: Cardiac murmur, unspecified: R01.1

## 2020-10-18 HISTORY — PX: AV FISTULA PLACEMENT: SHX1204

## 2020-10-18 HISTORY — DX: Other complications of anesthesia, initial encounter: T88.59XA

## 2020-10-18 LAB — POCT I-STAT, CHEM 8
BUN: 78 mg/dL — ABNORMAL HIGH (ref 6–20)
Calcium, Ion: 0.97 mmol/L — ABNORMAL LOW (ref 1.15–1.40)
Chloride: 114 mmol/L — ABNORMAL HIGH (ref 98–111)
Creatinine, Ser: 7.4 mg/dL — ABNORMAL HIGH (ref 0.44–1.00)
Glucose, Bld: 86 mg/dL (ref 70–99)
HCT: 31 % — ABNORMAL LOW (ref 36.0–46.0)
Hemoglobin: 10.5 g/dL — ABNORMAL LOW (ref 12.0–15.0)
Potassium: 5.5 mmol/L — ABNORMAL HIGH (ref 3.5–5.1)
Sodium: 141 mmol/L (ref 135–145)
TCO2: 17 mmol/L — ABNORMAL LOW (ref 22–32)

## 2020-10-18 LAB — SARS CORONAVIRUS 2 BY RT PCR (HOSPITAL ORDER, PERFORMED IN ~~LOC~~ HOSPITAL LAB): SARS Coronavirus 2: NEGATIVE

## 2020-10-18 LAB — POCT PREGNANCY, URINE: Preg Test, Ur: NEGATIVE

## 2020-10-18 SURGERY — ARTERIOVENOUS (AV) FISTULA CREATION
Anesthesia: General | Site: Arm Upper | Laterality: Left

## 2020-10-18 MED ORDER — HYDROCODONE-ACETAMINOPHEN 5-325 MG PO TABS: 1.0000 | ORAL_TABLET | ORAL | 0 refills | Status: DC | PRN

## 2020-10-18 MED ORDER — PHENYLEPHRINE HCL-NACL 10-0.9 MG/250ML-% IV SOLN
INTRAVENOUS | Status: DC | PRN
  Administered 2020-10-18: 40 ug/min via INTRAVENOUS

## 2020-10-18 MED ORDER — 0.9 % SODIUM CHLORIDE (POUR BTL) OPTIME
TOPICAL | Status: DC | PRN
  Administered 2020-10-18: 1000 mL

## 2020-10-18 MED ORDER — DEXTROSE 5 % IV SOLN
INTRAVENOUS | Status: DC | PRN
  Administered 2020-10-18: 3 g via INTRAVENOUS

## 2020-10-18 MED ORDER — HEPARIN SODIUM (PORCINE) 1000 UNIT/ML IJ SOLN
INTRAMUSCULAR | Status: DC | PRN
  Administered 2020-10-18: 3000 [IU] via INTRAVENOUS

## 2020-10-18 MED ORDER — HYDROMORPHONE HCL 1 MG/ML IJ SOLN: 0.2500 mg | INTRAMUSCULAR | Status: DC | PRN

## 2020-10-18 MED ORDER — FENTANYL CITRATE (PF) 100 MCG/2ML IJ SOLN
INTRAMUSCULAR | Status: DC | PRN
  Administered 2020-10-18: 25 ug via INTRAVENOUS
  Administered 2020-10-18: 50 ug via INTRAVENOUS
  Administered 2020-10-18: 25 ug via INTRAVENOUS
  Administered 2020-10-18: 50 ug via INTRAVENOUS

## 2020-10-18 MED ORDER — CHLORHEXIDINE GLUCONATE 0.12 % MT SOLN
15.0000 mL | Freq: Once | OROMUCOSAL | Status: AC
  Administered 2020-10-18: 15 mL via OROMUCOSAL
  Filled 2020-10-18: qty 15

## 2020-10-18 MED ORDER — MIDAZOLAM HCL 5 MG/5ML IJ SOLN
INTRAMUSCULAR | Status: DC | PRN
  Administered 2020-10-18: 2 mg via INTRAVENOUS

## 2020-10-18 MED ORDER — DEXMEDETOMIDINE (PRECEDEX) IN NS 20 MCG/5ML (4 MCG/ML) IV SYRINGE
PREFILLED_SYRINGE | INTRAVENOUS | Status: AC
  Filled 2020-10-18: qty 5

## 2020-10-18 MED ORDER — FENTANYL CITRATE (PF) 250 MCG/5ML IJ SOLN
INTRAMUSCULAR | Status: AC
  Filled 2020-10-18: qty 5

## 2020-10-18 MED ORDER — PROPOFOL 500 MG/50ML IV EMUL
INTRAVENOUS | Status: DC | PRN
  Administered 2020-10-18: 75 ug/kg/min via INTRAVENOUS

## 2020-10-18 MED ORDER — SODIUM CHLORIDE 0.9 % IV SOLN
INTRAVENOUS | Status: AC
  Filled 2020-10-18: qty 1.2

## 2020-10-18 MED ORDER — SODIUM CHLORIDE 0.9 % IV SOLN: INTRAVENOUS | Status: DC

## 2020-10-18 MED ORDER — MIDAZOLAM HCL 2 MG/2ML IJ SOLN
INTRAMUSCULAR | Status: AC
  Filled 2020-10-18: qty 2

## 2020-10-18 MED ORDER — PROPOFOL 10 MG/ML IV BOLUS
INTRAVENOUS | Status: DC | PRN
  Administered 2020-10-18: 20 mg via INTRAVENOUS
  Administered 2020-10-18: 180 mg via INTRAVENOUS

## 2020-10-18 MED ORDER — CHLORHEXIDINE GLUCONATE 4 % EX LIQD: 60.0000 mL | Freq: Once | CUTANEOUS | Status: DC

## 2020-10-18 MED ORDER — LIDOCAINE HCL (CARDIAC) PF 100 MG/5ML IV SOSY
PREFILLED_SYRINGE | INTRAVENOUS | Status: DC | PRN
  Administered 2020-10-18: 100 mg via INTRAVENOUS

## 2020-10-18 MED ORDER — SODIUM CHLORIDE 0.9 % IV SOLN
INTRAVENOUS | Status: DC | PRN
  Administered 2020-10-18: 500 mL

## 2020-10-18 MED ORDER — DEXMEDETOMIDINE (PRECEDEX) IN NS 20 MCG/5ML (4 MCG/ML) IV SYRINGE
PREFILLED_SYRINGE | INTRAVENOUS | Status: DC | PRN
  Administered 2020-10-18: 12 ug via INTRAVENOUS
  Administered 2020-10-18: 8 ug via INTRAVENOUS
  Administered 2020-10-18: 12 ug via INTRAVENOUS

## 2020-10-18 MED ORDER — ACETAMINOPHEN 10 MG/ML IV SOLN: 1000.0000 mg | Freq: Once | INTRAVENOUS | Status: DC | PRN

## 2020-10-18 MED ORDER — LIDOCAINE-EPINEPHRINE (PF) 1 %-1:200000 IJ SOLN
INTRAMUSCULAR | Status: DC | PRN
  Administered 2020-10-18: 8 mL

## 2020-10-18 MED ORDER — ONDANSETRON HCL 4 MG/2ML IJ SOLN
INTRAMUSCULAR | Status: DC | PRN
  Administered 2020-10-18: 4 mg via INTRAVENOUS

## 2020-10-18 MED ORDER — ONDANSETRON HCL 4 MG/2ML IJ SOLN: 4.0000 mg | Freq: Once | INTRAMUSCULAR | Status: DC | PRN

## 2020-10-18 SURGICAL SUPPLY — 30 items
ARMBAND PINK RESTRICT EXTREMIT (MISCELLANEOUS) ×4 IMPLANT
CANISTER SUCT 3000ML PPV (MISCELLANEOUS) ×2 IMPLANT
CLIP VESOCCLUDE MED 6/CT (CLIP) ×2 IMPLANT
CLIP VESOCCLUDE SM WIDE 6/CT (CLIP) ×2 IMPLANT
COVER PROBE W GEL 5X96 (DRAPES) ×2 IMPLANT
COVER WAND RF STERILE (DRAPES) ×2 IMPLANT
DERMABOND ADVANCED (GAUZE/BANDAGES/DRESSINGS) ×1
DERMABOND ADVANCED .7 DNX12 (GAUZE/BANDAGES/DRESSINGS) ×1 IMPLANT
ELECT REM PT RETURN 9FT ADLT (ELECTROSURGICAL) ×2
ELECTRODE REM PT RTRN 9FT ADLT (ELECTROSURGICAL) ×1 IMPLANT
GLOVE BIOGEL PI IND STRL 7.5 (GLOVE) ×1 IMPLANT
GLOVE BIOGEL PI INDICATOR 7.5 (GLOVE) ×1
GLOVE SURG SS PI 7.5 STRL IVOR (GLOVE) ×2 IMPLANT
GOWN STRL REUS W/ TWL LRG LVL3 (GOWN DISPOSABLE) ×2 IMPLANT
GOWN STRL REUS W/ TWL XL LVL3 (GOWN DISPOSABLE) ×1 IMPLANT
GOWN STRL REUS W/TWL LRG LVL3 (GOWN DISPOSABLE) ×2
GOWN STRL REUS W/TWL XL LVL3 (GOWN DISPOSABLE) ×1
HEMOSTAT SNOW SURGICEL 2X4 (HEMOSTASIS) IMPLANT
KIT BASIN OR (CUSTOM PROCEDURE TRAY) ×2 IMPLANT
KIT TURNOVER KIT B (KITS) ×2 IMPLANT
NS IRRIG 1000ML POUR BTL (IV SOLUTION) ×2 IMPLANT
PACK CV ACCESS (CUSTOM PROCEDURE TRAY) ×2 IMPLANT
PAD ARMBOARD 7.5X6 YLW CONV (MISCELLANEOUS) ×4 IMPLANT
SUT PROLENE 6 0 CC (SUTURE) ×2 IMPLANT
SUT VIC AB 3-0 SH 27 (SUTURE) ×1
SUT VIC AB 3-0 SH 27X BRD (SUTURE) ×1 IMPLANT
SUT VICRYL 4-0 PS2 18IN ABS (SUTURE) ×2 IMPLANT
TOWEL GREEN STERILE (TOWEL DISPOSABLE) ×2 IMPLANT
UNDERPAD 30X36 HEAVY ABSORB (UNDERPADS AND DIAPERS) ×2 IMPLANT
WATER STERILE IRR 1000ML POUR (IV SOLUTION) ×2 IMPLANT

## 2020-10-18 NOTE — Anesthesia Preprocedure Evaluation (Addendum)
Anesthesia Evaluation  Patient identified by MRN, date of birth, ID band Patient awake    Reviewed: Patient's Chart, lab work & pertinent test results  History of Anesthesia Complications (+) Emergence Delirium  Airway Mallampati: III  TM Distance: >3 FB Neck ROM: Full    Dental  (+) Teeth Intact   Pulmonary asthma , Current Smoker,    Pulmonary exam normal        Cardiovascular hypertension, Pt. on medications  Rhythm:Regular Rate:Normal     Neuro/Psych Anxiety Depression Bipolar Disorder PTSDnegative neurological ROS     GI/Hepatic negative GI ROS, Neg liver ROS,   Endo/Other  negative endocrine ROS  Renal/GU CRFRenal disease  negative genitourinary   Musculoskeletal negative musculoskeletal ROS (+)   Abdominal (+) + obese,  Abdomen: soft. Bowel sounds: normal.  Peds  Hematology negative hematology ROS (+)   Anesthesia Other Findings   Reproductive/Obstetrics negative OB ROS                             Anesthesia Physical Anesthesia Plan  ASA: III  Anesthesia Plan: MAC and General   Post-op Pain Management:    Induction:   PONV Risk Score and Plan: 1 and Ondansetron, Propofol infusion, Midazolam and Treatment may vary due to age or medical condition  Airway Management Planned: Simple Face Mask and Nasal Cannula  Additional Equipment: None  Intra-op Plan:   Post-operative Plan:   Informed Consent: I have reviewed the patients History and Physical, chart, labs and discussed the procedure including the risks, benefits and alternatives for the proposed anesthesia with the patient or authorized representative who has indicated his/her understanding and acceptance.     Dental advisory given  Plan Discussed with: CRNA  Anesthesia Plan Comments: (Converted to general with LMA at procedure outset as patient poorly tolerating MAC. Vitals stable, atraumatic LMA insertion.    Lab Results      Component                Value               Date                      WBC                      8.6                 08/18/2019                HGB                      10.5 (L)            10/18/2020                HCT                      31.0 (L)            10/18/2020                MCV                      90.3                08/18/2019  PLT                      222                 08/18/2019           Lab Results      Component                Value               Date                      NA                       141                 10/18/2020                K                        5.5 (H)             10/18/2020                CO2                      24                  01/30/2020                GLUCOSE                  86                  10/18/2020                BUN                      78 (H)              10/18/2020                CREATININE               7.40 (H)            10/18/2020                CALCIUM                  9.0                 01/30/2020                GFRNONAA                 24 (L)              01/30/2020                GFRAA                    27 (L)              01/30/2020           Lab Results      Component                Value  Date                      PREGTESTUR               Negative            01/30/2020                HCG                      <5.0                08/14/2019          )       Anesthesia Quick Evaluation

## 2020-10-18 NOTE — Anesthesia Procedure Notes (Signed)
Procedure Name: MAC Date/Time: 10/18/2020 10:27 AM Performed by: Candis Shine, CRNA Pre-anesthesia Checklist: Patient identified, Emergency Drugs available, Suction available, Patient being monitored and Timeout performed Patient Re-evaluated:Patient Re-evaluated prior to induction Oxygen Delivery Method: Simple face mask Dental Injury: Teeth and Oropharynx as per pre-operative assessment

## 2020-10-18 NOTE — Progress Notes (Signed)
Pt is alert & oriented and requesting to go home as soon as possible, 2 sets of vs taken 15 min apart. All vss. Pt assisted with dressing, IV removed, then discharged.

## 2020-10-18 NOTE — Transfer of Care (Signed)
Immediate Anesthesia Transfer of Care Note  Patient: Natasha Long  Procedure(s) Performed: LEFT ARM ARTERIOVENOUS (AV) FISTULA CREATION (Left Arm Upper)  Patient Location: PACU  Anesthesia Type:General  Level of Consciousness: drowsy  Airway & Oxygen Therapy: Patient Spontanous Breathing and Patient connected to face mask oxygen  Post-op Assessment: Report given to RN and Post -op Vital signs reviewed and stable  Post vital signs: Reviewed and stable  Last Vitals:  Vitals Value Taken Time  BP 133/68 10/18/20 1147  Temp    Pulse 82 10/18/20 1150  Resp 13 10/18/20 1150  SpO2 100 % 10/18/20 1150  Vitals shown include unvalidated device data.  Last Pain:  Vitals:   10/18/20 0856  PainSc: 0-No pain         Complications: No complications documented.

## 2020-10-18 NOTE — Anesthesia Procedure Notes (Signed)
Procedure Name: LMA Insertion Date/Time: 10/18/2020 10:47 AM Performed by: Candis Shine, CRNA Pre-anesthesia Checklist: Patient identified, Emergency Drugs available, Suction available and Patient being monitored Patient Re-evaluated:Patient Re-evaluated prior to induction Oxygen Delivery Method: Circle System Utilized Preoxygenation: Pre-oxygenation with 100% oxygen Induction Type: IV induction LMA: LMA inserted LMA Size: 4.0 Number of attempts: 1 Placement Confirmation: positive ETCO2 Tube secured with: Tape Dental Injury: Teeth and Oropharynx as per pre-operative assessment

## 2020-10-18 NOTE — Discharge Instructions (Signed)
° °  [facility] ° °Discharge Instructions ° °AV Fistula or Graft Surgery for Dialysis Access ° °Please refer to the following instructions for your post-procedure care. Your surgeon or physician assistant will discuss any changes with you. ° °Activity ° °You may drive the day following your surgery, if you are comfortable and no longer taking prescription pain medication. Resume full activity as the soreness in your incision resolves. ° °Bathing/Showering ° °You may shower after you go home. Keep your incision dry for 48 hours. Do not soak in a bathtub, hot tub, or swim until the incision heals completely. You may not shower if you have a hemodialysis catheter. ° °Incision Care ° °Clean your incision with mild soap and water after 48 hours. Pat the area dry with a clean towel. You do not need a bandage unless otherwise instructed. Do not apply any ointments or creams to your incision. You may have skin glue on your incision. Do not peel it off. It will come off on its own in about one week. Your arm may swell a bit after surgery. To reduce swelling use pillows to elevate your arm so it is above your heart. Your doctor will tell you if you need to lightly wrap your arm with an ACE bandage. ° °Diet ° °Resume your normal diet. There are not special food restrictions following this procedure. In order to heal from your surgery, it is CRITICAL to get adequate nutrition. Your body requires vitamins, minerals, and protein. Vegetables are the best source of vitamins and minerals. Vegetables also provide the perfect balance of protein. Processed food has little nutritional value, so try to avoid this. ° °Medications ° °Resume taking all of your medications. If your incision is causing pain, you may take over-the counter pain relievers such as acetaminophen (Tylenol). If you were prescribed a stronger pain medication, please be aware these medications can cause nausea and constipation. Prevent  nausea by taking the medication with a snack or meal. Avoid constipation by drinking plenty of fluids and eating foods with high amount of fiber, such as fruits, vegetables, and grains. Do not take Tylenol if you are taking prescription pain medications. ° ° ° ° °Follow up °Your surgeon may want to see you in the office following your access surgery. If so, this will be arranged at the time of your surgery. ° °Please call us immediately for any of the following conditions: ° °Increased pain, redness, drainage (pus) from your incision site °Fever of 101 degrees or higher °Severe or worsening pain at your incision site °Hand pain or numbness. ° °Reduce your risk of vascular disease: ° °Stop smoking. If you would like help, call [facility] at 1-800-QUIT-NOW (1-800-784-8669) or [place] at 336-586-4000 ° °Manage your cholesterol °Maintain a desired weight °Control your diabetes °Keep your blood pressure down ° °Dialysis ° °It will take several weeks to several months for your new dialysis access to be ready for use. Your surgeon will determine when it is OK to use it. Your nephrologist will continue to direct your dialysis. You can continue to use your Permcath until your new access is ready for use. ° °If you have any questions, please call the office at [phone]. ° °

## 2020-10-18 NOTE — Interval H&P Note (Signed)
History and Physical Interval Note:  10/18/2020 9:49 AM  Natasha Long  has presented today for surgery, with the diagnosis of CHRONIC RENAL INSUFFICIENCY STAGE V.  The various methods of treatment have been discussed with the patient and family. After consideration of risks, benefits and other options for treatment, the patient has consented to  Procedure(s): LEFT ARM ARTERIOVENOUS (AV) FISTULA CREATION (Left) as a surgical intervention.  The patient's history has been reviewed, patient examined, no change in status, stable for surgery.  I have reviewed the patient's chart and labs.  Questions were answered to the patient's satisfaction.     Annamarie Major

## 2020-10-18 NOTE — Op Note (Signed)
    Patient name: Natasha Long MRN: [id number] DOB: 04/30/1996 Sex: female  10/18/2020 Pre-operative Diagnosis: CKD 4 Post-operative diagnosis:  Same Surgeon:  Annamarie Major Assistants: Marlinda Mike Procedure:   Left brachiocephalic fistula Anesthesia: General Blood Loss: Minimal Specimens: None  Findings: Small brachial artery measuring about 2 mm.  The vein was of adequate caliber at the antecubital crease measuring about 6 mm.  It tapered to around 3 mm in the upper arm  Indications: The patient is here today for dialysis access.  Procedure:  The patient was identified in the holding area and taken to [facility]  The patient was then placed supine on the table. general anesthesia was administered.  The patient was prepped and draped in the usual sterile fashion.  A time out was called and antibiotics were administered.  A PA was necessary to expedite the procedure and assist with suction retraction and closure.  Ultrasound was used to evaluate the veins in the left arm.  The cephalic vein appeared to be adequate for fistula creation.  1% lidocaine was used for local anesthesia.  A transverse incision was made just proximal to the antecubital crease.  I first dissected out the brachial artery.  This was a small artery measuring about 2 mm.  It was encircled proximally and distally with Vesseloops.  I then dissected out the cephalic vein.  This was fairly large at the antecubital crease measuring about 6 mm.  I dissected out throughout the length of the incision.  I did taper a little bit proximally to about 3 mm.  The vein was then marked for orientation and ligated distally.  It distended nicely with heparin saline.  3000 units of heparin were given.  The brachial artery was then occluded with vascular clamps and a #11 blade was used to make an arteriotomy.  This was extended longitudinally with Potts scissors.  The vein was cut to the appropriate length and spatulated to fit the size  of the arteriotomy.  A running anastomosis was created with 6-0 Prolene.  Prior to completion the appropriate flushing maneuvers were performed and the anastomosis was completed.  I inspected the course of the vein to make sure there were no kinks.  There is a palpable thrill within the fistula.  There was a brisk radial artery Doppler signal and good flow through the fistula with Doppler.  Hemostasis was then achieved.  The incision was closed by reapproximating the deep tissue with 3-0 Vicryl and the skin with 4-0 Vicryl.  Dermabond was applied.  There were no immediate complications.   Disposition: To PACU stable.   Theotis Burrow, M.D., [facility] Office: [phone] Pager:  [phone]

## 2020-10-18 NOTE — Anesthesia Postprocedure Evaluation (Signed)
Anesthesia Post Note  Patient: [name]  Procedure(s) Performed: LEFT ARM ARTERIOVENOUS (AV) FISTULA CREATION (Left Arm Upper)     Patient location during evaluation: PACU Anesthesia Type: General Level of consciousness: awake and alert Pain management: pain level controlled Vital Signs Assessment: post-procedure vital signs reviewed and stable Respiratory status: spontaneous breathing, nonlabored ventilation, respiratory function stable and patient connected to nasal cannula oxygen Cardiovascular status: blood pressure returned to baseline and stable Postop Assessment: no apparent nausea or vomiting Anesthetic complications: no   No complications documented.  Last Vitals:  Vitals:   10/18/20 1145 10/18/20 1200  BP: 133/68 139/83  Pulse: 81 85  Resp: 15 15  Temp: 36.7 C 36.7 C  SpO2: 98% 99%    Last Pain:  Vitals:   10/18/20 1215  PainSc: 0-No pain                 Belenda Cruise P Griffey [place]

## 2020-10-19 ENCOUNTER — Encounter (HOSPITAL_COMMUNITY): Payer: Self-pay | Admitting: Surgery

## 2020-10-21 ENCOUNTER — Encounter (HOSPITAL_COMMUNITY): Payer: Self-pay | Admitting: Surgery

## 2020-11-14 ENCOUNTER — Other Ambulatory Visit: Payer: Self-pay | Admitting: *Deleted

## 2020-11-14 DIAGNOSIS — N185 Chronic kidney disease, stage 5: Secondary | ICD-10-CM

## 2020-12-02 ENCOUNTER — Encounter (HOSPITAL_COMMUNITY): Payer: Medicaid Other

## 2020-12-11 ENCOUNTER — Encounter (HOSPITAL_COMMUNITY): Payer: Medicaid Other

## 2020-12-17 ENCOUNTER — Other Ambulatory Visit: Payer: Self-pay

## 2020-12-17 ENCOUNTER — Encounter (HOSPITAL_COMMUNITY): Payer: Self-pay

## 2020-12-17 DIAGNOSIS — A64 Unspecified sexually transmitted disease: Secondary | ICD-10-CM | POA: Insufficient documentation

## 2020-12-17 DIAGNOSIS — Z5321 Procedure and treatment not carried out due to patient leaving prior to being seen by health care provider: Secondary | ICD-10-CM | POA: Diagnosis not present

## 2020-12-17 DIAGNOSIS — N898 Other specified noninflammatory disorders of vagina: Secondary | ICD-10-CM | POA: Diagnosis present

## 2020-12-17 NOTE — ED Triage Notes (Signed)
Pt arrived via walk in, states she has been having "cottage cheese like" vaginal discharge since this morning that she is concerned is an STD. Denies any known exposure or any new sexual partners. No urinary sx.

## 2020-12-17 NOTE — ED Notes (Signed)
Patient was called to reassess vital signs but no response. x1 

## 2020-12-18 ENCOUNTER — Emergency Department (HOSPITAL_COMMUNITY)
Admission: EM | Admit: 2020-12-18 | Discharge: 2020-12-18 | Disposition: A | Discharge status: Home / Self Care | Payer: Medicaid Other | Attending: Emergency Medicine | Admitting: Emergency Medicine

## 2020-12-18 NOTE — ED Notes (Signed)
Pt called 3x for room placement. Eloped from waiting area.  

## 2020-12-19 ENCOUNTER — Other Ambulatory Visit (HOSPITAL_COMMUNITY)
Admission: RE | Admit: 2020-12-19 | Discharge: 2020-12-19 | Disposition: A | Discharge status: Home / Self Care | Payer: Medicaid Other | Source: Ambulatory Visit | Attending: Internal Medicine | Admitting: Internal Medicine

## 2020-12-19 ENCOUNTER — Other Ambulatory Visit: Payer: Self-pay

## 2020-12-19 ENCOUNTER — Encounter: Payer: Self-pay | Admitting: Internal Medicine

## 2020-12-19 ENCOUNTER — Telehealth: Payer: Self-pay

## 2020-12-19 ENCOUNTER — Ambulatory Visit: Payer: Medicaid Other | Attending: Internal Medicine | Admitting: Internal Medicine

## 2020-12-19 VITALS — BP 134/85 | HR 77 | Temp 98.5°F | Resp 16 | Wt 283.2 lb

## 2020-12-19 DIAGNOSIS — F17210 Nicotine dependence, cigarettes, uncomplicated: Secondary | ICD-10-CM | POA: Diagnosis not present

## 2020-12-19 DIAGNOSIS — Z1159 Encounter for screening for other viral diseases: Secondary | ICD-10-CM | POA: Diagnosis not present

## 2020-12-19 DIAGNOSIS — Z2821 Immunization not carried out because of patient refusal: Secondary | ICD-10-CM

## 2020-12-19 DIAGNOSIS — Z114 Encounter for screening for human immunodeficiency virus [HIV]: Secondary | ICD-10-CM | POA: Insufficient documentation

## 2020-12-19 DIAGNOSIS — N898 Other specified noninflammatory disorders of vagina: Secondary | ICD-10-CM | POA: Insufficient documentation

## 2020-12-19 DIAGNOSIS — Z6841 Body Mass Index (BMI) 40.0 and over, adult: Secondary | ICD-10-CM | POA: Insufficient documentation

## 2020-12-19 NOTE — Progress Notes (Signed)
Patient ID: Natasha Long, female    DOB: 1996/08/09  MRN: [id number]  CC: STD check   Subjective: Natasha Long is a 25 y.o. female who presents for UC visit Her concerns today include:  Pt with hx of obesity, CKD4, tob dep, former street drug user (clean since 10/2019)  Pt requesting STD screen and HIV/hep C screen C/o cottage cheese vaginal discgh x 3-4 days.  No itching. Sexually active with 2 persons in the past yr 1.5 yrs.  Currently has a steady boyfriend.  Does not use condoms. Not on any method of birth control.    Patient Active Problem List   Diagnosis Date Noted  . Influenza vaccine refused 12/19/2020  . Morbid obesity ([place]) 01/30/2020  . Abscess of skin 01/30/2020  . 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 CKD (chronic kidney disease) stage 4, GFR 15-29 ml/min ([facility]) 01/30/2020  . Cocaine use, 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[place]) 01/30/2020  . Affective psychosis, bipolar ([place]) 06/27/2019  . Borderline personality disorder ([place]) 11/16/2018  . Moderate cannabis use disorder ([place]) 11/16/2018  . Severe recurrent major depression without psychotic features ([place]) 11/15/2018  . Chronic hypertension 04/04/2018  . Nephrotic syndrome 04/01/2018  . PTSD (post-traumatic stress disorder) 04/01/2018  . Bipolar disorder, current episode depressed, mild ([place]) 04/09/2017     Current Outpatient Medications on File Prior to Visit  Medication Sig Dispense Refill  . calcitRIOL (ROCALTROL) 0.25 MCG capsule Take 0.25 mcg by mouth every Monday, Wednesday, and Friday.     . losartan (COZAAR) 25 MG tablet Take 25 mg by mouth daily.    . sodium bicarbonate 650 MG tablet Take 1,300 mg by mouth daily.    . Blood Pressure Monitoring (BLOOD PRESSURE MONITOR 7) DEVI 1 Units by Does not apply route daily. Measure blood pressure daily (Patient not taking: Reported on 12/19/2020) 1 each 0  . DULoxetine (CYMBALTA) 20 MG capsule Take 1 capsule (20 mg total) by mouth daily.  (Patient not taking: No sig reported) 30 capsule 2  . furosemide (LASIX) 80 MG tablet Take 1 tablet (80 mg total) by mouth 2 (two) times daily. (Patient not taking: No sig reported) 60 tablet 2  . gabapentin (NEURONTIN) 600 MG tablet Take 0.5 tablets (300 mg total) by mouth 3 (three) times daily. (Patient taking differently: Take 300 mg by mouth daily as needed (anxiety). ) 45 tablet 2  . HYDROcodone-acetaminophen (NORCO/VICODIN) 5-325 MG tablet Take 1 tablet by mouth every 4 (four) hours as needed for moderate pain. (Patient not taking: Reported on 12/19/2020) 20 tablet 0  . labetalol (NORMODYNE) 200 MG tablet Take 0.5 tablets (100 mg total) by mouth daily. (Patient not taking: Reported on 10/15/2020) 15 tablet 2  . naphazoline-glycerin (CLEAR EYES REDNESS) 0.012-0.2 % SOLN 1-2 drops 4 (four) times daily as needed for eye irritation. (Patient not taking: Reported on 12/19/2020)    . ondansetron (ZOFRAN) 4 MG tablet Take 1 tablet (4 mg total) by mouth every 8 (eight) hours as needed for nausea or vomiting. (Patient not taking: Reported on 12/19/2020) 30 tablet 0   No current facility-administered medications on file prior to visit.    Allergies  Allergen Reactions  . Prozac [Fluoxetine Hcl] Other (See Comments)    Panic attack   . Wellbutrin [Bupropion] Other (See Comments)    Panic attack  . Prednisone Other (See Comments)    Pt states that this med caused pancreatitis.     Social History   Socioeconomic History  . Marital  status: Single    Spouse name: Not on file  . Number of children: 1  . Years of education: Not on file  . Highest education level: 12th grade  Occupational History  . Not on file  Tobacco Use  . Smoking status: Current Every Day Smoker    Packs/day: 0.40    Years: 8.00    Pack years: 3.20    Types: Cigarettes  . Smokeless tobacco: Never Used  Vaping Use  . Vaping Use: Never used  Substance and Sexual Activity  . Alcohol use: Yes    Comment: "maybe 3 a  month."- liquor  . Drug use: Not Currently    Types: Marijuana, Cocaine    Comment: 10/17/20- no cocaine, marijunia -last time 2-3 weeks ago.  [place] Kitchen Sexual activity: Yes    Birth control/protection: None  Other Topics Concern  . Not on file  Social History Narrative  . Not on file   Social Determinants of Health   Financial Resource Strain: Not on file  Food Insecurity: Not on file  Transportation Needs: Not on file  Physical Activity: Not on file  Stress: Not on file  Social Connections: Not on file  Intimate Partner Violence: Not on file    Family History  Problem Relation Age of Onset  . Diabetes Other   . Hypertension Other     Past Surgical History:  Procedure Laterality Date  . AV FISTULA PLACEMENT Left 10/18/2020   Procedure: LEFT ARM ARTERIOVENOUS (AV) FISTULA CREATION;  Surgeon: Serafina Mitchell, MD;  Location: [facility];  Service: Vascular;  Laterality: Left;  . CHOLECYSTECTOMY    . extraction of wisdom teeth    . RENAL BIOPSY      ROS: Review of Systems Negative except as stated above  PHYSICAL EXAM: BP 134/85   Pulse 77   Temp 98.5 F (36.9 C)   Resp 16   Wt 283 lb 3.2 oz (128.5 kg)   SpO2 100%   BMI 43.06 kg/m   Physical Exam  General appearance - alert, well appearing, young obese African-American female and in no distress Mental status - normal mood, behavior, speech, dress, motor activity, and thought processes   CMP Latest Ref Rng & Units 10/18/2020 01/30/2020 08/20/2019  Glucose 70 - 99 mg/dL 86 83 -  BUN 6 - 20 mg/dL 78(H) 16 -  Creatinine 0.44 - 1.00 mg/dL 7.40(H) 2.68(H) -  Sodium 135 - 145 mmol/L 141 141 -  Potassium 3.5 - 5.1 mmol/L 5.5(H) 4.5 3.6  Chloride 98 - 111 mmol/L 114(H) 105 -  CO2 20 - 29 mmol/L - 24 -  Calcium 8.7 - 10.2 mg/dL - 9.0 -  Total Protein 6.0 - 8.5 g/dL - 5.2(L) -  Total Bilirubin 0.0 - 1.2 mg/dL - <0.2 -  Alkaline Phos 39 - 117 IU/L - 75 -  AST 0 - 40 IU/L - 21 -  ALT 0 - 32 IU/L - 14 -   Lipid Panel      Component Value Date/Time   CHOL 125 02/12/2019 0626   CHOL 169 09/15/2017 1657   TRIG 149 02/12/2019 0626   HDL 29 (L) 02/12/2019 0626   HDL 53 09/15/2017 1657   CHOLHDL 4.3 02/12/2019 0626   VLDL 30 02/12/2019 0626   LDLCALC 66 02/12/2019 0626   LDLCALC 77 09/15/2017 1657    CBC    Component Value Date/Time   WBC 8.6 08/18/2019 0317   RBC 4.12 08/18/2019 0317   HGB 10.5 (L) 10/18/2020  0843   HGB 14.5 04/01/2018 1635   HCT 31.0 (L) 10/18/2020 0843   HCT 41.8 04/01/2018 1635   PLT 222 08/18/2019 0317   PLT 260 04/01/2018 1635   MCV 90.3 08/18/2019 0317   MCV 88 04/01/2018 1635   MCH 29.9 08/18/2019 0317   MCHC 33.1 08/18/2019 0317   RDW 13.3 08/18/2019 0317   RDW 13.5 04/01/2018 1635   LYMPHSABS 4.6 (H) 08/17/2019 0335   LYMPHSABS 3.0 04/01/2018 1635   MONOABS 0.6 08/17/2019 0335   EOSABS 0.1 08/17/2019 0335   EOSABS 0.2 04/01/2018 1635   BASOSABS 0.1 08/17/2019 0335   BASOSABS 0.0 04/01/2018 1635    ASSESSMENT AND PLAN: 1. Vaginal discharge We will send screening for STDs, HIV and hepatitis C.  Encouraged use of condoms unless in monogamous relationship. - HIV Antibody (routine testing w rflx) - Hepatitis C Antibody - Cervicovaginal ancillary only  2. Influenza vaccine refused Recommended.  Patient declined  3. Screening for HIV (human immunodeficiency virus) - HIV Antibody (routine testing w rflx)  4. Need for hepatitis C screening test - Hepatitis C Antibody     Patient was given the opportunity to ask questions.  Patient verbalized understanding of the plan and was able to repeat key elements of the plan.   Orders Placed This Encounter  Procedures  . HIV Antibody (routine testing w rflx)  . Hepatitis C Antibody     Requested Prescriptions    No prescriptions requested or ordered in this encounter    Return for GIve appt with Dr. Joya Gaskins next mth for routine f/u and address pain issues.  Karle Plumber, MD, FACP

## 2020-12-19 NOTE — Telephone Encounter (Signed)
Transition Care Management Follow-up Telephone Call   Date discharged?12/18/20   How have you been since you were released from the hospital? "im good"   Do you understand why you were in the hospital? yes   Do you understand the discharge instructions? yes   Where were you discharged to? home   Items Reviewed:  Medications reviewed: yes  Allergies reviewed: yes  Dietary changes reviewed: yes  Referrals reviewed: yes   Functional Questionnaire:      Any transportation issues/concerns?: no   Any patient concerns? no   Confirmed importance and date/time of follow-up visits scheduled yes  Provider Appointment booked with  Confirmed with patient if condition begins to worsen call PCP or go to the ER.  Patient was given the office number and encouraged to call back with question or concerns.  : yes

## 2020-12-19 NOTE — Progress Notes (Signed)
Pt is requesting to be checked for STDS  Pt is requesting to have blood work done for hiv, heptatis, etc   Pt is taking gabapentin

## 2020-12-20 LAB — HEPATITIS C ANTIBODY: Hep C Virus Ab: <0.1 s/co ratio (ref 0.0–0.9)

## 2020-12-20 LAB — HIV ANTIBODY (ROUTINE TESTING W REFLEX): HIV Screen 4th Generation wRfx: NONREACTIVE

## 2020-12-23 ENCOUNTER — Telehealth: Payer: Self-pay | Admitting: Critical Care Medicine

## 2020-12-23 LAB — CERVICOVAGINAL ANCILLARY ONLY
Bacterial Vaginitis (gardnerella): POSITIVE — AB
Candida Glabrata: NEGATIVE
Candida Vaginitis: POSITIVE — AB
Chlamydia: NEGATIVE
Comment: NEGATIVE
Comment: NEGATIVE
Comment: NEGATIVE
Comment: NEGATIVE
Comment: NEGATIVE
Comment: NORMAL
Neisseria Gonorrhea: NEGATIVE
Trichomonas: NEGATIVE

## 2020-12-23 NOTE — Telephone Encounter (Signed)
Copied from [place] [id number]. Topic: General - Other >> Dec 23, 2020  9:53 AM Celene Kras wrote: Reason for CRM: Pt calling and is requesting to have a nurse give her a call back regarding her recent labs. Please advise.

## 2020-12-25 ENCOUNTER — Other Ambulatory Visit: Payer: Self-pay | Admitting: Internal Medicine

## 2020-12-25 MED ORDER — FLUCONAZOLE 150 MG PO TABS: 150.0000 mg | ORAL_TABLET | Freq: Once | ORAL | 0 refills | Status: AC

## 2020-12-25 MED ORDER — METRONIDAZOLE 500 MG PO TABS: 500.0000 mg | ORAL_TABLET | Freq: Two times a day (BID) | ORAL | 0 refills | Status: DC

## 2020-12-25 NOTE — Telephone Encounter (Signed)
Returned pt call to see what questions she has in regards to her labs pt states she doesn't have any

## 2020-12-27 ENCOUNTER — Ambulatory Visit (HOSPITAL_COMMUNITY)
Admission: EM | Admit: 2020-12-27 | Discharge: 2020-12-27 | Disposition: A | Discharge status: Home / Self Care | Payer: Medicaid Other

## 2020-12-27 ENCOUNTER — Other Ambulatory Visit: Payer: Self-pay

## 2020-12-30 ENCOUNTER — Other Ambulatory Visit: Payer: Self-pay

## 2020-12-30 ENCOUNTER — Encounter (HOSPITAL_COMMUNITY): Payer: Self-pay | Admitting: Emergency Medicine

## 2020-12-30 ENCOUNTER — Ambulatory Visit (HOSPITAL_COMMUNITY)
Admission: EM | Admit: 2020-12-30 | Discharge: 2020-12-30 | Disposition: A | Discharge status: Home / Self Care | Payer: Medicaid Other | Attending: Registered Nurse | Admitting: Registered Nurse

## 2020-12-30 DIAGNOSIS — F1290 Cannabis use, unspecified, uncomplicated: Secondary | ICD-10-CM | POA: Diagnosis not present

## 2020-12-30 DIAGNOSIS — Z560 Unemployment, unspecified: Secondary | ICD-10-CM | POA: Insufficient documentation

## 2020-12-30 DIAGNOSIS — F3131 Bipolar disorder, current episode depressed, mild: Secondary | ICD-10-CM | POA: Diagnosis not present

## 2020-12-30 DIAGNOSIS — F603 Borderline personality disorder: Secondary | ICD-10-CM | POA: Diagnosis not present

## 2020-12-30 DIAGNOSIS — N185 Chronic kidney disease, stage 5: Secondary | ICD-10-CM

## 2020-12-30 DIAGNOSIS — F4310 Post-traumatic stress disorder, unspecified: Secondary | ICD-10-CM | POA: Diagnosis not present

## 2020-12-30 DIAGNOSIS — Z9151 Personal history of suicidal behavior: Secondary | ICD-10-CM | POA: Insufficient documentation

## 2020-12-30 DIAGNOSIS — R454 Irritability and anger: Secondary | ICD-10-CM | POA: Insufficient documentation

## 2020-12-30 HISTORY — DX: Personal history of other mental and behavioral disorders: Z86.59
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
## 2020-12-30 NOTE — ED Triage Notes (Signed)
Pt presents to [facility] as a walk-in requesting medication management for her Borderline Personality Disorder. Pt stated that she has been off her meds for over a year. Pt reported taking Abilify injection. Pt denies SI, HI and AVH at this time.

## 2020-12-30 NOTE — ED Notes (Signed)
Patient A&O x 4, ambulatory. Patient discharged in no acute distress. Patient denied SI/HI, A/VH upon discharge. Patient verbalized understanding of all discharge instructions explained by staff, to include follow up appointments and safety plan. Patient escorted to lobby via staff for transport to destination. Safety maintained.
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
## 2021-01-15 ENCOUNTER — Inpatient Hospital Stay (HOSPITAL_COMMUNITY)
Admission: EM | Admit: 2021-01-15 | Discharge: 2021-01-17 | DRG: 682 | Disposition: A | Discharge status: Home / Self Care | Payer: Medicaid Other | Attending: Family Medicine | Admitting: Family Medicine

## 2021-01-15 DIAGNOSIS — Z8249 Family history of ischemic heart disease and other diseases of the circulatory system: Secondary | ICD-10-CM | POA: Diagnosis not present

## 2021-01-15 DIAGNOSIS — N920 Excessive and frequent menstruation with regular cycle: Secondary | ICD-10-CM | POA: Diagnosis present

## 2021-01-15 DIAGNOSIS — Z79899 Other long term (current) drug therapy: Secondary | ICD-10-CM | POA: Diagnosis not present

## 2021-01-15 DIAGNOSIS — D62 Acute posthemorrhagic anemia: Secondary | ICD-10-CM | POA: Diagnosis present

## 2021-01-15 DIAGNOSIS — K922 Gastrointestinal hemorrhage, unspecified: Secondary | ICD-10-CM | POA: Diagnosis present

## 2021-01-15 DIAGNOSIS — F259 Schizoaffective disorder, unspecified: Secondary | ICD-10-CM

## 2021-01-15 DIAGNOSIS — Z833 Family history of diabetes mellitus: Secondary | ICD-10-CM

## 2021-01-15 DIAGNOSIS — N189 Chronic kidney disease, unspecified: Secondary | ICD-10-CM

## 2021-01-15 DIAGNOSIS — I120 Hypertensive chronic kidney disease with stage 5 chronic kidney disease or end stage renal disease: Principal | ICD-10-CM | POA: Diagnosis present

## 2021-01-15 DIAGNOSIS — F4310 Post-traumatic stress disorder, unspecified: Secondary | ICD-10-CM | POA: Diagnosis not present

## 2021-01-15 DIAGNOSIS — R45851 Suicidal ideations: Secondary | ICD-10-CM

## 2021-01-15 DIAGNOSIS — F1290 Cannabis use, unspecified, uncomplicated: Secondary | ICD-10-CM | POA: Diagnosis present

## 2021-01-15 DIAGNOSIS — D649 Anemia, unspecified: Secondary | ICD-10-CM | POA: Diagnosis present

## 2021-01-15 DIAGNOSIS — F603 Borderline personality disorder: Secondary | ICD-10-CM | POA: Diagnosis present

## 2021-01-15 DIAGNOSIS — Z6841 Body Mass Index (BMI) 40.0 and over, adult: Secondary | ICD-10-CM

## 2021-01-15 DIAGNOSIS — N051 Unspecified nephritic syndrome with focal and segmental glomerular lesions: Secondary | ICD-10-CM | POA: Diagnosis present

## 2021-01-15 DIAGNOSIS — Z888 Allergy status to other drugs, medicaments and biological substances status: Secondary | ICD-10-CM | POA: Diagnosis not present

## 2021-01-15 DIAGNOSIS — Z20822 Contact with and (suspected) exposure to covid-19: Secondary | ICD-10-CM | POA: Diagnosis present

## 2021-01-15 DIAGNOSIS — G4700 Insomnia, unspecified: Secondary | ICD-10-CM | POA: Diagnosis present

## 2021-01-15 DIAGNOSIS — Z5901 Sheltered homelessness: Secondary | ICD-10-CM | POA: Diagnosis not present

## 2021-01-15 DIAGNOSIS — Z9151 Personal history of suicidal behavior: Secondary | ICD-10-CM | POA: Diagnosis not present

## 2021-01-15 DIAGNOSIS — N186 End stage renal disease: Secondary | ICD-10-CM | POA: Diagnosis present

## 2021-01-15 DIAGNOSIS — K625 Hemorrhage of anus and rectum: Secondary | ICD-10-CM

## 2021-01-15 DIAGNOSIS — F3131 Bipolar disorder, current episode depressed, mild: Secondary | ICD-10-CM | POA: Diagnosis present

## 2021-01-15 DIAGNOSIS — F17210 Nicotine dependence, cigarettes, uncomplicated: Secondary | ICD-10-CM | POA: Diagnosis present

## 2021-01-15 DIAGNOSIS — K649 Unspecified hemorrhoids: Secondary | ICD-10-CM

## 2021-01-15 DIAGNOSIS — Z992 Dependence on renal dialysis: Secondary | ICD-10-CM | POA: Diagnosis not present

## 2021-01-15 DIAGNOSIS — F319 Bipolar disorder, unspecified: Secondary | ICD-10-CM | POA: Diagnosis present

## 2021-01-15 DIAGNOSIS — D631 Anemia in chronic kidney disease: Secondary | ICD-10-CM | POA: Diagnosis present

## 2021-01-15 LAB — CBC WITH DIFFERENTIAL/PLATELET
Abs Immature Granulocytes: 0.03 10*3/uL (ref 0.00–0.07)
Basophils Absolute: 0 10*3/uL (ref 0.0–0.1)
Basophils Relative: 0 %
Eosinophils Absolute: 0.2 10*3/uL (ref 0.0–0.5)
Eosinophils Relative: 2 %
HCT: 23.5 % — ABNORMAL LOW (ref 36.0–46.0)
Hemoglobin: 7.5 g/dL — ABNORMAL LOW (ref 12.0–15.0)
Immature Granulocytes: 0 %
Lymphocytes Relative: 16 %
Lymphs Abs: 1.7 10*3/uL (ref 0.7–4.0)
MCH: 31.1 pg (ref 26.0–34.0)
MCHC: 31.9 g/dL (ref 30.0–36.0)
MCV: 97.5 fL (ref 80.0–100.0)
Monocytes Absolute: 0.6 10*3/uL (ref 0.1–1.0)
Monocytes Relative: 6 %
Neutro Abs: 8.2 10*3/uL — ABNORMAL HIGH (ref 1.7–7.7)
Neutrophils Relative %: 76 %
Platelets: 199 10*3/uL (ref 150–400)
RBC: 2.41 MIL/uL — ABNORMAL LOW (ref 3.87–5.11)
RDW: 13.8 % (ref 11.5–15.5)
WBC: 10.8 10*3/uL — ABNORMAL HIGH (ref 4.0–10.5)
nRBC: 0 % (ref 0.0–0.2)

## 2021-01-15 LAB — SALICYLATE LEVEL: Salicylate Lvl: <7 mg/dL — ABNORMAL LOW (ref 7.0–30.0)

## 2021-01-15 LAB — HEMOGLOBIN AND HEMATOCRIT, BLOOD
HCT: 24.6 % — ABNORMAL LOW (ref 36.0–46.0)
Hemoglobin: 7.4 g/dL — ABNORMAL LOW (ref 12.0–15.0)

## 2021-01-15 LAB — IRON AND TIBC
Iron: 32 ug/dL (ref 28–170)
Saturation Ratios: 18 % (ref 10.4–31.8)
TIBC: 182 ug/dL — ABNORMAL LOW (ref 250–450)
UIBC: 150 ug/dL

## 2021-01-15 LAB — RESP PANEL BY RT-PCR (FLU A&B, COVID) ARPGX2
Influenza A by PCR: NEGATIVE
Influenza B by PCR: NEGATIVE
SARS Coronavirus 2 by RT PCR: NEGATIVE

## 2021-01-15 LAB — COMPREHENSIVE METABOLIC PANEL
ALT: 37 U/L (ref 0–44)
AST: 23 U/L (ref 15–41)
Albumin: 2.8 g/dL — ABNORMAL LOW (ref 3.5–5.0)
Alkaline Phosphatase: 62 U/L (ref 38–126)
Anion gap: 12 (ref 5–15)
BUN: 78 mg/dL — ABNORMAL HIGH (ref 6–20)
CO2: 18 mmol/L — ABNORMAL LOW (ref 22–32)
Calcium: 7.1 mg/dL — ABNORMAL LOW (ref 8.9–10.3)
Chloride: 113 mmol/L — ABNORMAL HIGH (ref 98–111)
Creatinine, Ser: 12.01 mg/dL — ABNORMAL HIGH (ref 0.44–1.00)
GFR, Estimated: 4 mL/min — ABNORMAL LOW (ref >60)
Glucose, Bld: 92 mg/dL (ref 70–99)
Potassium: 4.9 mmol/L (ref 3.5–5.1)
Sodium: 143 mmol/L (ref 135–145)
Total Bilirubin: 0.2 mg/dL — ABNORMAL LOW (ref 0.3–1.2)
Total Protein: 6.4 g/dL — ABNORMAL LOW (ref 6.5–8.1)

## 2021-01-15 LAB — RAPID URINE DRUG SCREEN, HOSP PERFORMED
Amphetamines: NOT DETECTED
Barbiturates: NOT DETECTED
Benzodiazepines: NOT DETECTED
Cocaine: NOT DETECTED
Opiates: NOT DETECTED
Tetrahydrocannabinol: POSITIVE — AB

## 2021-01-15 LAB — FERRITIN: Ferritin: 102 ng/mL (ref 11–307)

## 2021-01-15 LAB — I-STAT BETA HCG BLOOD, ED (MC, WL, AP ONLY): I-stat hCG, quantitative: <5 m[IU]/mL (ref <5)

## 2021-01-15 LAB — HEPATITIS C ANTIBODY: HCV Ab: NONREACTIVE

## 2021-01-15 LAB — PREPARE RBC (CROSSMATCH)

## 2021-01-15 LAB — ETHANOL: Alcohol, Ethyl (B): <10 mg/dL (ref <10)

## 2021-01-15 LAB — POC OCCULT BLOOD, ED: Fecal Occult Bld: POSITIVE — AB

## 2021-01-15 LAB — ACETAMINOPHEN LEVEL: Acetaminophen (Tylenol), Serum: <10 ug/mL — ABNORMAL LOW (ref 10–30)

## 2021-01-15 MED ORDER — SODIUM CHLORIDE 0.9 % IV SOLN: 10.0000 mL/h | Freq: Once | INTRAVENOUS | Status: DC

## 2021-01-15 MED ORDER — DIPHENHYDRAMINE HCL 50 MG/ML IJ SOLN
50.0000 mg | Freq: Once | INTRAMUSCULAR | Status: DC
  Filled 2021-01-15: qty 1

## 2021-01-15 MED ORDER — ZOLPIDEM TARTRATE 5 MG PO TABS: 5.0000 mg | ORAL_TABLET | Freq: Every evening | ORAL | Status: DC | PRN

## 2021-01-15 MED ORDER — ACETAMINOPHEN 325 MG PO TABS: 650.0000 mg | ORAL_TABLET | ORAL | Status: DC | PRN

## 2021-01-15 MED ORDER — ALUM & MAG HYDROXIDE-SIMETH 200-200-20 MG/5ML PO SUSP: 30.0000 mL | Freq: Four times a day (QID) | ORAL | Status: DC | PRN

## 2021-01-15 MED ORDER — RISPERIDONE 2 MG PO TBDP
2.0000 mg | ORAL_TABLET | Freq: Three times a day (TID) | ORAL | Status: DC | PRN
  Filled 2021-01-15: qty 1

## 2021-01-15 MED ORDER — LORAZEPAM 1 MG PO TABS: 1.0000 mg | ORAL_TABLET | ORAL | Status: DC | PRN

## 2021-01-15 MED ORDER — ZIPRASIDONE MESYLATE 20 MG IM SOLR
20.0000 mg | INTRAMUSCULAR | Status: DC | PRN
Start: 2021-01-15 — End: 2021-01-15

## 2021-01-15 MED ORDER — LOSARTAN POTASSIUM 50 MG PO TABS
25.0000 mg | ORAL_TABLET | Freq: Every day | ORAL | Status: DC
  Administered 2021-01-15: 25 mg via ORAL
  Filled 2021-01-15: qty 1

## 2021-01-15 MED ORDER — DULOXETINE HCL 20 MG PO CPEP
20.0000 mg | ORAL_CAPSULE | Freq: Every day | ORAL | Status: DC
  Filled 2021-01-15: qty 1

## 2021-01-15 MED ORDER — DIPHENHYDRAMINE HCL 50 MG/ML IJ SOLN
50.0000 mg | Freq: Once | INTRAMUSCULAR | Status: AC
  Administered 2021-01-15: 50 mg via INTRAMUSCULAR

## 2021-01-15 MED ORDER — CALCIUM CARBONATE ANTACID 500 MG PO CHEW: 200.0000 mg | CHEWABLE_TABLET | Freq: Once | ORAL | Status: DC

## 2021-01-15 MED ORDER — NICOTINE 21 MG/24HR TD PT24
21.0000 mg | MEDICATED_PATCH | Freq: Every day | TRANSDERMAL | Status: DC
Start: 2021-01-15 — End: 2021-01-17
  Administered 2021-01-15 – 2021-01-17 (×3): 21 mg via TRANSDERMAL
  Filled 2021-01-15 (×3): qty 1

## 2021-01-15 MED ORDER — ONDANSETRON HCL 4 MG PO TABS: 4.0000 mg | ORAL_TABLET | Freq: Three times a day (TID) | ORAL | Status: DC | PRN

## 2021-01-15 MED ORDER — ZIPRASIDONE MESYLATE 20 MG IM SOLR
20.0000 mg | Freq: Once | INTRAMUSCULAR | Status: AC
  Administered 2021-01-15: 20 mg via INTRAMUSCULAR

## 2021-01-15 NOTE — Hospital Course (Addendum)
Natasha Long is a 25 y.o. female presenting with active suicidal ideations. PMH is significant for bipolar disorder, ESRD, hypertension, peripheral neuropathy and substance use.   GI bleed?  Normocytic Anemia Incidental finding.  Admission hemoglobin 7.5; baseline appears to be 12-14. Likely multifactorial secondary to current menstruation and anemia of chronic disease. Fecal occult positive. Negative pregnancy test.  Iron studies demonstrated ***. Patient denies any other source of bleeding other than menstruation. Denies hematemesis, hematochezia, melena, hemoptysis and dyspnea. Currently asymptomatic otherwise.  Type and screen was ordered, transfusion threshold determined to be hemoglobin of 7.  Patient did ***not require blood transfusion while admitted.  GI consulted, recommended ***.    Suicidal ideations Presented under IVC from [employer] for active suicidal ideations. Patient currently denies suicidal ideations and plan but admits to previous suicidal attempt where she jumped off a roof. Per chart review, patient has had multiple psychiatric-related hospitalizations.  Geophysical data processor utilized for safety.  Psychiatry consulted, recommended ***.  IVC reassessed 2/19, decision was made to ***discontinue/continue.  Patient discharged to ***home/inpatient psychiatry.  Patient discharged on the following medications: ***.  At time of discharge, patient stable and without suicidal ideation.***   Hypertension On admission 167/75.  Home medication of losartan continued on admission.  Blood pressure on day of discharge ***.    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 Patient progressed from CKD stage 4 to ESRD, reports that she has not started dialysis yet but is scheduled to start on 01/20/2021.  Nephrology  consulted, recommended ***.  Patient received hemodialysis while admitted ***times.  Discharged on planned hemodialysis schedule of MWF.***   Leukocytosis Admission WBC 10.8, likely reactive.  Low suspicion for infectious etiology.  WBC down-trended throughout admission to *** on day of discharge.   Hypocalcemia likely secondary to CKD Ca 7.1 on admission, corrected Ca 8.1. Likely secondary to poor renal function.  Magnesium***, phosphorus***, PTH***.  Monitored with CMP daily.  On day of discharge, corrected calcium***.    Chronic conditions stable during admission: Bipolar disorder Anxiety THC use Tobacco use Unstable living situation  Discharge recommendations:

## 2021-01-15 NOTE — Consult Note (Signed)
[facility]  HISTORY AND PHYSICAL  Natasha Long is an 25 y.o. female.    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 Thompson Grayer for evaluation and recommendations surrounding advanced CKD.    Pt was IVC'd from [employer] and has received Geodon so she is somewhat sedated.  Notes state that she had suicidal ideations.  When asked, she says "what happened to me?"   She follows with Dr Johnney Ou in our clinic, last seen 10/17/2019.  She has missed multiple appointments since that time.  She had an AVF placed 10/19/2019 by Dr Trula Slade in preparation of starting dialysis.   Last OP labs include Cr of 7.40 in pre-op before AVF.  Today, her Cr is 12.10, Bun 78, K 4.9, albumin 2.8, WBC ct 10.8, hgb 7.5, and plts 199.  In this setting we are asked to see.    Pt is groggy.  She says that she has had some weight loss of 21 lbs and some itching.  No n/v, metallic taste, hiccups.  GI has been consulted for anemia.  PMH: Past Medical History:  Diagnosis Date  . Asthma    as a child  . Bipolar affective, manic ([place])    denies -- it was determined to be border line personsity disorder  . Complication of anesthesia    woke up before tube removed, 1 time fought nurses  . Heart murmur    as a infant does not have now  . History of borderline personality disorder   . Hypertension    diagnosed as child; stopped meds at 35 yo  . Hypoglycemia   . Insomnia   . Nephrotic syndrome    sees [facility]  . Nephrotic syndrome   . PTSD (post-traumatic stress disorder)   . PTSD (post-traumatic stress disorder)   . Renal disease    PSH: Past Surgical History:  Procedure Laterality Date  . AV FISTULA PLACEMENT Left 10/18/2020   Procedure: LEFT ARM ARTERIOVENOUS (AV) FISTULA CREATION;  Surgeon: Serafina Mitchell, MD;  Location: [facility];  Service: Vascular;  Laterality: Left;  . CHOLECYSTECTOMY    . extraction of wisdom teeth    . RENAL BIOPSY      Past Medical History:  Diagnosis Date  . Asthma    as a child  . 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 Complication of anesthesia    woke up before tube removed, 1 time fought nurses  . Heart murmur    as a infant does not have now  . History of borderline personality disorder   . Hypertension    diagnosed as child; stopped meds at 64 yo  . Hypoglycemia   . Insomnia   . Nephrotic syndrome    sees [facility]  . Nephrotic syndrome   . PTSD (post-traumatic stress disorder)   . PTSD (post-traumatic stress disorder)   . Renal disease     Medications: Reviewed in Epic and include: - calcitriol 0.25 mcg MWF - furosemide 80 mg BID - labetalol 100 mg daily - losartan 25 mg daily    ALLERGIES:   Allergies  Allergen Reactions  . Prozac [Fluoxetine Hcl] Other (See Comments)    Panic attack   . Wellbutrin [  Bupropion] Other (See Comments)    Panic attack  . Prednisone Other (See Comments)    Pt states that this med caused pancreatitis.     FAM HX: Family History  Adopted: Yes  Problem Relation Age of Onset  . Diabetes Other   . Hypertension Other     Social History:   reports that she has been smoking cigarettes. She has a 3.20 pack-year smoking history. She has never used smokeless tobacco. She reports previous alcohol use. She reports previous drug use. Drugs: Marijuana and Cocaine.  ROS: ROS: all other systems reviewed and are negative except as per HPI  Blood pressure (!) 150/92, pulse 82, temperature 97.8 F (36.6 C), resp. rate 20, SpO2 100 %. PHYSICAL EXAM: Physical Exam  GEN NAD, lying in bed on her side HEENT EOMI sclerae anicteric NECK no overt JVT PULM clear bilaterally no c/w/r CV RRR no m/r/g ABD obese, soft, NABS EXT trace LE edema NEURO  groggy but answers questions appropriately SKIN no rashes ACCESS: L AVF + T/B   Results for orders placed or performed during the hospital encounter of 01/15/21 (from the past 48 hour(s))  Urine rapid drug screen (hosp performed)     Status: Abnormal   Collection Time: 01/15/21 11:49 AM  Result Value Ref Range   Opiates NONE DETECTED NONE DETECTED   Cocaine NONE DETECTED NONE DETECTED   Benzodiazepines NONE DETECTED NONE DETECTED   Amphetamines NONE DETECTED NONE DETECTED   Tetrahydrocannabinol POSITIVE (A) NONE DETECTED   Barbiturates NONE DETECTED NONE DETECTED    Comment: (NOTE) DRUG SCREEN FOR MEDICAL PURPOSES ONLY.  IF CONFIRMATION IS NEEDED FOR ANY PURPOSE, NOTIFY LAB WITHIN 5 DAYS.  LOWEST DETECTABLE LIMITS FOR URINE DRUG SCREEN Drug Class                     Cutoff (ng/mL) Amphetamine and metabolites    1000 Barbiturate and metabolites    200 Benzodiazepine                 [id number] Tricyclics and metabolites     300 Opiates and metabolites        300 Cocaine and metabolites        300 THC                            50 Performed at [facility], [place] [address], [place],  [address]   Resp Panel by RT-PCR (Flu A&B, Covid) Nasopharyngeal Swab     Status: None   Collection Time: 01/15/21 12:07 PM   Specimen: Nasopharyngeal Swab; Nasopharyngeal(NP) swabs in vial transport medium  Result Value Ref Range   SARS Coronavirus 2 by RT PCR NEGATIVE NEGATIVE    Comment: (NOTE) SARS-CoV-2 target nucleic acids are NOT DETECTED.  The SARS-CoV-2 RNA is generally detectable in upper respiratory specimens during the acute phase of infection. The lowest concentration of SARS-CoV-2 viral copies this assay can detect is 138 copies/mL. A negative result does not preclude SARS-Cov-2 infection and should not be used as the sole basis for treatment or other patient management decisions. A negative result may occur with  improper specimen collection/handling, submission of specimen  other than nasopharyngeal swab, presence of viral mutation(s) within the areas targeted by this assay, and inadequate number of viral copies(<138 copies/mL). A negative result must be combined with clinical observations, patient history, and epidemiological information. The expected result is Negative.  Fact Sheet for  Patients:  EntrepreneurPulse.com.au  Fact Sheet for Healthcare Providers:  IncredibleEmployment.be  This test is no t yet approved or cleared by the [place] FDA and  has been authorized for detection and/or diagnosis of SARS-CoV-2 by FDA under an Emergency Use Authorization (EUA). This EUA will remain  in effect (meaning this test can be used) for the duration of the COVID-19 declaration under Section 564(b)(1) of the Act, 21 U.S.C.section 360bbb-3(b)(1), unless the authorization is terminated  or revoked sooner.       Influenza A by PCR NEGATIVE NEGATIVE   Influenza B by PCR NEGATIVE NEGATIVE    Comment: (NOTE) The Xpert Xpress SARS-CoV-2/FLU/RSV plus assay is intended as an aid in the diagnosis of influenza from Nasopharyngeal swab specimens and should not be used as a sole basis for treatment. Nasal washings and aspirates are unacceptable for Xpert Xpress SARS-CoV-2/FLU/RSV testing.  Fact Sheet for Patients: EntrepreneurPulse.com.au  Fact Sheet for Healthcare Providers: IncredibleEmployment.be  This test is not yet approved or cleared by the [place] FDA and has been authorized for detection and/or diagnosis of SARS-CoV-2 by FDA under an Emergency Use Authorization (EUA). This EUA will remain in effect (meaning this test can be used) for the duration of the COVID-19 declaration under Section 564(b)(1) of the Act, 21 U.S.C. section 360bbb-3(b)(1), unless the authorization is terminated or revoked.  Performed at [facility], [place] [address], [place], [place] [address]    Acetaminophen level     Status: Abnormal   Collection Time: 01/15/21  1:18 PM  Result Value Ref Range   Acetaminophen (Tylenol), Serum <10 (L) 10 - 30 ug/mL    Comment: (NOTE) Therapeutic concentrations vary significantly. A range of 10-30 ug/mL  may be an effective concentration for many patients. However, some  are best treated at concentrations outside of this range. Acetaminophen concentrations >150 ug/mL at 4 hours after ingestion  and >50 ug/mL at 12 hours after ingestion are often associated with  toxic reactions.  Performed at [facility], [place] [address], [place], [place] [address]   Comprehensive metabolic panel     Status: Abnormal   Collection Time: 01/15/21  1:18 PM  Result Value Ref Range   Sodium 143 135 - 145 mmol/L   Potassium 4.9 3.5 - 5.1 mmol/L   Chloride 113 (H) 98 - 111 mmol/L   CO2 18 (L) 22 - 32 mmol/L   Glucose, Bld 92 70 - 99 mg/dL    Comment: Glucose reference range applies only to samples taken after fasting for at least 8 hours.   BUN 78 (H) 6 - 20 mg/dL   Creatinine, Ser 12.01 (H) 0.44 - 1.00 mg/dL   Calcium 7.1 (L) 8.9 - 10.3 mg/dL   Total Protein 6.4 (L) 6.5 - 8.1 g/dL   Albumin 2.8 (L) 3.5 - 5.0 g/dL   AST 23 15 - 41 U/L   ALT 37 0 - 44 U/L   Alkaline Phosphatase 62 38 - 126 U/L   Total Bilirubin 0.2 (L) 0.3 - 1.2 mg/dL   GFR, Estimated 4 (L) >60 mL/min    Comment: (NOTE) Calculated using the CKD-EPI Creatinine Equation (2021)    Anion gap 12 5 - 15    Comment: Performed at [facility] [address] [place], [place] [address]  Ethanol     Status: None   Collection Time: 01/15/21  1:18 PM  Result Value Ref Range   Alcohol, Ethyl (B) <10 <10 mg/dL  Comment: (NOTE) Lowest detectable limit for serum alcohol is 10 mg/dL.  For medical purposes only. Performed at [facility], [place] [address] [place], [place] [id number]   Salicylate level     Status: Abnormal   Collection Time: 01/15/21  1:18 PM  Result Value Ref  Range   Salicylate Lvl [id number] (L) 7.0 - 30.0 mg/dL    Comment: Performed at [facility] [address], [place], [place] [address]  CBC with Differential     Status: Abnormal   Collection Time: 01/15/21  1:18 PM  Result Value Ref Range   WBC 10.8 (H) 4.0 - 10.5 K/uL   RBC 2.41 (L) 3.87 - 5.11 MIL/uL   Hemoglobin 7.5 (L) 12.0 - 15.0 g/dL   HCT 23.5 (L) 36.0 - 46.0 %   MCV 97.5 80.0 - 100.0 fL   MCH 31.1 26.0 - 34.0 pg   MCHC 31.9 30.0 - 36.0 g/dL   RDW 13.8 11.5 - 15.5 %   Platelets 199 150 - 400 K/uL   nRBC 0.0 0.0 - 0.2 %   Neutrophils Relative % 76 %   Neutro Abs 8.2 (H) 1.7 - 7.7 K/uL   Lymphocytes Relative 16 %   Lymphs Abs 1.7 0.7 - 4.0 K/uL   Monocytes Relative 6 %   Monocytes Absolute 0.6 0.1 - 1.0 K/uL   Eosinophils Relative 2 %   Eosinophils Absolute 0.2 0.0 - 0.5 K/uL   Basophils Relative 0 %   Basophils Absolute 0.0 0.0 - 0.1 K/uL   Immature Granulocytes 0 %   Abs Immature Granulocytes 0.03 0.00 - 0.07 K/uL    Comment: Performed at [facility], [place] [address], [place], [place] [address]  I-Stat beta hCG blood, ED     Status: None   Collection Time: 01/15/21  1:22 PM  Result Value Ref Range   I-stat hCG, quantitative <5.0 <5 mIU/mL   Comment 3            Comment:   GEST. AGE      CONC.  (mIU/mL)   <=1 WEEK        5 - 50     2 WEEKS       50 - 500     3 WEEKS       100 - 10,000     4 WEEKS     1,000 - 30,000        FEMALE AND NON-PREGNANT FEMALE:     LESS THAN 5 mIU/mL   POC occult blood, ED RN will collect     Status: Abnormal   Collection Time: 01/15/21  2:27 PM  Result Value Ref Range   Fecal Occult Bld POSITIVE (A) NEGATIVE  Type and screen     Status: None (Preliminary result)   Collection Time: 01/15/21  2:30 PM  Result Value Ref Range   ABO/RH(D) A NEG    Antibody Screen NEG    Sample Expiration 01/18/2021,2359    Unit Number [id number]    Blood Component Type RED CELLS,LR    Unit division 00    Status of Unit ALLOCATED     Transfusion Status OK TO TRANSFUSE    Crossmatch Result      Compatible Performed at [facility], [place] [address] [place], [place] [address]   Prepare RBC (crossmatch)     Status: None   Collection Time: 01/15/21  2:33 PM  Result Value Ref Range   Order Confirmation  ORDER PROCESSED BY BLOOD BANK Performed at [facility], [place] [address], [place], [place] [address]     No results found.  Assessment/Plan  1.  Advanced CKD: Looks like she has had progression of CKD since her last labs 10/16/20.  Some mild uremic symptoms of weight loss and itching.  No emergent indication for dialysis as K, CO2, volume seems to be OK as well.  Will continue the dialogue with her re: dialysis during her hospitalization.  Frankly hard to tell the extent of her uremic symptoms since she's sedated.  AVF appears ready for use if needed.  2.  Suicidal ideations: currently IVC'd.  Per primary   3.  Anemia: anemia of CKD vs GI bleed vs other.  GI consulted, appreciate assistance  4.  HTN: on labetalol as OP and losartan.  Given h/o collapsing FSGS and nephrotic syndrome, would favor keeping losartan on for now  5.  Dispo: IVC  Madelon Lips 01/15/2021, 6:10 PM

## 2021-01-15 NOTE — ED Triage Notes (Signed)
Pt brought to ED by Behavior health response team member and patrol officer. Pt presented to [employer] last night d/t being suicidal/domestic situation.

## 2021-01-15 NOTE — ED Notes (Signed)
PA notified of positive hemoccult

## 2021-01-15 NOTE — H&P (Addendum)
[facility] Admission History and Physical Service Pager: [phone]  Patient name: Natasha Long Medical record number: [id number] Date of birth: 1996/06/05 Age: 25 y.o. Gender: female  Primary Care Provider: [name], MD Consultants: Nephrology, GI Code Status: Full  Preferred Emergency Contact: Hollace Kinnier [phone] (friend)  Chief Complaint: active suicidal ideations   Assessment and Plan: Natasha Long is a 25 y.o. female presenting with active suicidal ideations. PMH is significant for bipolar disorder, ESRD, hypertension, peripheral neuropathy and substance use.  GI bleed?  Normocytic Anemia Incidental finding. Patient presents with Hgb 7.5 on admission, baseline appears to be 12-14. Likely multifactorial secondary to current menstruation and anemia of chronic disease. Fecal occult positive. Negative pregnancy test. Patient denies any other source of bleeding other than menstruation. Denies hematemesis, hematochezia, melena, hemoptysis and dyspnea. Currently asymptomatic otherwise. Transfusion threshold likely 7, type and screen ordered. Hold transfusion given patient lacks symptoms.  -Admit to FPTS, attending Dr. Owens Shark -GI consulted, appreciate recommendations -monitor CBC -f/u iron studies -pending am labs -up with assistance -PT/OT eval in am -Incentive Spirometer  Suicidal ideations Presented from [employer] for active suicidal ideations and presented under IVC. Patient currently denies suicidal ideations and plan but admits to previous suicidal attempt where she jumped off a roof. Per chart review, patient has had multiple psychiatric-related hospitalizations.  Pt received Geodon 20 mg in the ED.  She does not currently have any restraints in place.   -consider psychiatry consult when more medically stable -sitter present -Reassess IVC 02/19  Hypertension On admission 167/75. Home medications include Losartan 25  mg. -Continue Losartan 25 mg daily per Nephro  Advanced CKD secondary to FSGS Likely prerenal due to poor perfusion in the setting of chronic hypertension, Cr 12.01 on admission. Secondary to focal segmental glomerular nephrosis. Baseline appears to be around 4. Patient progressed from CKD stage 4 to ESRD, reports that she has not started dialysis yet but is scheduled to start on 01/20/2021.  -nephrology consulted, appreciate recommendations  -monitor I/Os -Consider restarting Losartan 25 mg daily per nephro  Leukocytosis Admitted WBC 10.8, likely reactive. Will continue to monitor, no signs of infection and vitals normal. Likely to resolve.  -am CBC  Hypocalcemia likely secondary to CKD Ca 7.1 on admission, corrected Ca 8.1. Likely secondary to poor renal function. -pending Mg, Phosphorus, PTH -monitor with CMP  Bipolar Disorder  Anxiety Home medications include duloxetine 20 mg daily. -hold home meds, pending med rec  Substance use UDS positive THC. -consider TOC consult    Tobacco use Current smoker. -nicotine patch 21 mg as desired   Unstable living situation Currently homeless, prior unstable living situation.  -TOC consult prior to discharge to ensure safe disposition   FEN/GI: NPO at midnight  Prophylaxis: SCDs  Disposition: admit to [facility], attending Dr. Owens Shark  History of Present Illness:  Natasha Long is a 25 y.o. female presenting with active suicidal ideations and under IVC. Patient awakens to exam, states that she in here for stress and thought she was going to be at behavioral health instead. Does not remember how she got here. Denies generalized or localized pain. Denies numbness, tingling, dyspnea, chest pain and any other concerns. States that she is hungry.Denies any further complaints.   Review Of Systems: Per HPI with the following additions:   Review of Systems  Constitutional: Negative for fever.  Respiratory: Negative for shortness of breath.    Cardiovascular: Negative for chest pain.  Gastrointestinal: Negative for abdominal pain, nausea and  vomiting.  Neurological: Negative for seizures and weakness.  Psychiatric/Behavioral: Negative for suicidal ideas.  All other systems reviewed and are negative.    Patient Active Problem List   Diagnosis Date Noted  . Influenza vaccine refused 12/19/2020  . Morbid obesity ([place]) 01/30/2020  . Abscess of skin 01/30/2020  . 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 CKD (chronic kidney disease) stage 4, GFR 15-29 ml/min (HCC) 01/30/2020  . Cocaine use, 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[place]) 01/30/2020  . Affective psychosis, bipolar ([place]) 06/27/2019  . Borderline personality disorder ([place]) 11/16/2018  . Moderate cannabis use disorder ([place]) 11/16/2018  . Severe recurrent major depression without psychotic features ([place]) 11/15/2018  . Chronic hypertension 04/04/2018  . Nephrotic syndrome 04/01/2018  . PTSD (post-traumatic stress disorder) 04/01/2018  . Bipolar disorder, current episode depressed, mild ([place]) 04/09/2017    Past Medical History: Past Medical History:  Diagnosis Date  . Asthma    as a child  . Bipolar affective, manic ([place])    denies -- it was determined to be border line personsity disorder  . Complication of anesthesia    woke up before tube removed, 1 time fought nurses  . Heart murmur    as a infant does not have now  . History of borderline personality disorder   . Hypertension    diagnosed as child; stopped meds at 26 yo  . Hypoglycemia   . Insomnia   . Nephrotic syndrome    sees [facility]  . Nephrotic syndrome   . PTSD (post-traumatic stress disorder)   . PTSD (post-traumatic stress disorder)   . Renal disease     Past Surgical History: Past Surgical History:  Procedure Laterality Date  . AV FISTULA PLACEMENT Left 10/18/2020   Procedure: LEFT ARM ARTERIOVENOUS (AV) FISTULA CREATION;  Surgeon: Serafina Mitchell, MD;  Location: [facility];   Service: Vascular;  Laterality: Left;  . CHOLECYSTECTOMY    . extraction of wisdom teeth    . RENAL BIOPSY      Social History: Social History   Tobacco Use  . Smoking status: Current Every Day Smoker    Packs/day: 0.40    Years: 8.00    Pack years: 3.20    Types: Cigarettes  . Smokeless tobacco: Never Used  Vaping Use  . Vaping Use: Never used  Substance Use Topics  . Alcohol use: Not Currently    Comment: "maybe 3 a month."- liquor  . Drug use: Not Currently    Types: Marijuana, Cocaine    Comment: 10/17/20- no cocaine, marijunia -last time 2-3 weeks ago.    Please also refer to relevant sections of EMR.  Family History: Family History  Adopted: Yes  Problem Relation Age of Onset  . Diabetes Other   . Hypertension Other      Allergies and Medications: Allergies  Allergen Reactions  . Prozac [Fluoxetine Hcl] Other (See Comments)    Panic attack   . Wellbutrin [Bupropion] Other (See Comments)    Panic attack  . Prednisone Other (See Comments)    Pt states that this med caused pancreatitis.    No current facility-administered medications on file prior to encounter.   Current Outpatient Medications on File Prior to Encounter  Medication Sig Dispense Refill  . calcitRIOL (ROCALTROL) 0.25 MCG capsule Take 0.25 mcg by mouth every Monday, Wednesday, and Friday.     . gabapentin (NEURONTIN) 600 MG tablet Take 0.5 tablets (300 mg total) by mouth 3 (three) times daily. (Patient taking differently: Take  300 mg by mouth daily as needed (anxiety).) 45 tablet 2  . losartan (COZAAR) 25 MG tablet Take 25 mg by mouth daily.    . naphazoline-glycerin (CLEAR EYES REDNESS) 0.012-0.2 % SOLN 1-2 drops 4 (four) times daily as needed for eye irritation.    . sodium bicarbonate 650 MG tablet Take 1,300 mg by mouth daily.    . Blood Pressure Monitoring (BLOOD PRESSURE MONITOR 7) DEVI 1 Units by Does not apply route daily. Measure blood pressure daily (Patient not taking: Reported on  12/19/2020) 1 each 0  . DULoxetine (CYMBALTA) 20 MG capsule Take 1 capsule (20 mg total) by mouth daily. (Patient not taking: No sig reported) 30 capsule 2  . furosemide (LASIX) 80 MG tablet Take 1 tablet (80 mg total) by mouth 2 (two) times daily. 60 tablet 2  . HYDROcodone-acetaminophen (NORCO/VICODIN) 5-325 MG tablet Take 1 tablet by mouth every 4 (four) hours as needed for moderate pain. 20 tablet 0  . labetalol (NORMODYNE) 200 MG tablet Take 0.5 tablets (100 mg total) by mouth daily. 15 tablet 2  . metroNIDAZOLE (FLAGYL) 500 MG tablet Take 1 tablet (500 mg total) by mouth 2 (two) times daily. (Patient not taking: No sig reported) 14 tablet 0  . ondansetron (ZOFRAN) 4 MG tablet Take 1 tablet (4 mg total) by mouth every 8 (eight) hours as needed for nausea or vomiting. (Patient not taking: No sig reported) 30 tablet 0    Objective: BP (!) 167/75 (BP Location: Right Leg)   Pulse 83   Temp 97.9 F (36.6 C) (Axillary)   Resp 18   SpO2 100%  Exam: General: Patient laying comfortably in bed, in no acute distress. Neck: supple, no evidence of lymphadenopathy Cardiovascular: RRR, no murmurs or gallops auscultated  Respiratory: lungs clear to auscultation bilaterally, normal WOB, breathing comfortably on room air Gastrointestinal: soft, nontender, presence of active bowel sounds MSK: no LE edema noted bilaterally, radial and distal pulses strong and equal bilaterally, fistula in place on left arm Derm: skin warm to touch, no rashes or lesions noted  Neuro: AOx3, gross sensation intact, 5/5 UE and LE strength bilaterally Psych: mood appropriate, denies SI or plan, no agitation noted   Labs and Imaging: CBC BMET  Recent Labs  Lab 01/15/21 1318  WBC 10.8*  HGB 7.5*  HCT 23.5*  PLT 199   Recent Labs  Lab 01/15/21 1318  NA 143  K 4.9  CL 113*  CO2 18*  BUN 78*  CREATININE 12.01*  GLUCOSE 92  CALCIUM 7.1*     EKG: sinus tachycardia, no ST elevations noted   No results  found.  Donney Dice, DO 01/15/2021, 4:29 PM PGY-1, [place] Intern pager: [phone], text pages welcome  FPTS Upper-Level Resident Addendum   I have independently interviewed and examined the patient. I have discussed the above with the original author and agree with their documentation.  Please see also any attending notes.    Carollee Leitz MD PGY-2, [facility] 01/15/2021 8:12 PM  FPTS Service pager: [phone] (text pages welcome through [place])
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
## 2021-01-15 NOTE — ED Notes (Signed)
Pt allowed this nurse to draw her blood. Pt changed into scrubs. Resting on stretcher with snacks at bedside.

## 2021-01-15 NOTE — ED Provider Notes (Signed)
I spoke to [facility] who will admit.   [facility] Consulted.  Nancy Marus PA advised they will see tomorrow am.  Nephrology  consulted   Sidney Ace 01/15/21 1641    Fredia Sorrow, MD 01/28/21 1740

## 2021-01-15 NOTE — ED Notes (Signed)
Pt ambulatory to bathroom steady gait

## 2021-01-15 NOTE — ED Notes (Signed)
Pa reports holding blood transfusion till after speaking to hospitalist
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
## 2021-01-15 NOTE — ED Notes (Signed)
Pts belongings in locker #2

## 2021-01-15 NOTE — ED Notes (Signed)
Pt refused cymbalta. Requested to "wait" for Losartan.
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
## 2021-01-16 ENCOUNTER — Encounter (HOSPITAL_COMMUNITY): Payer: Self-pay | Admitting: Family Medicine

## 2021-01-16 ENCOUNTER — Other Ambulatory Visit: Payer: Self-pay

## 2021-01-16 DIAGNOSIS — F4310 Post-traumatic stress disorder, unspecified: Secondary | ICD-10-CM

## 2021-01-16 DIAGNOSIS — D649 Anemia, unspecified: Secondary | ICD-10-CM | POA: Diagnosis present

## 2021-01-16 DIAGNOSIS — R45851 Suicidal ideations: Secondary | ICD-10-CM

## 2021-01-16 LAB — PHOSPHORUS: Phosphorus: 7.6 mg/dL — ABNORMAL HIGH (ref 2.5–4.6)

## 2021-01-16 LAB — CBC WITH DIFFERENTIAL/PLATELET
Abs Immature Granulocytes: 0.04 10*3/uL (ref 0.00–0.07)
Basophils Absolute: 0 10*3/uL (ref 0.0–0.1)
Basophils Relative: 0 %
Eosinophils Absolute: 0.2 10*3/uL (ref 0.0–0.5)
Eosinophils Relative: 2 %
HCT: 23.3 % — ABNORMAL LOW (ref 36.0–46.0)
Hemoglobin: 7.2 g/dL — ABNORMAL LOW (ref 12.0–15.0)
Immature Granulocytes: 0 %
Lymphocytes Relative: 20 %
Lymphs Abs: 2 10*3/uL (ref 0.7–4.0)
MCH: 30.5 pg (ref 26.0–34.0)
MCHC: 30.9 g/dL (ref 30.0–36.0)
MCV: 98.7 fL (ref 80.0–100.0)
Monocytes Absolute: 0.6 10*3/uL (ref 0.1–1.0)
Monocytes Relative: 6 %
Neutro Abs: 7.1 10*3/uL (ref 1.7–7.7)
Neutrophils Relative %: 72 %
Platelets: 179 10*3/uL (ref 150–400)
RBC: 2.36 MIL/uL — ABNORMAL LOW (ref 3.87–5.11)
RDW: 13.6 % (ref 11.5–15.5)
WBC: 9.8 10*3/uL (ref 4.0–10.5)
nRBC: 0 % (ref 0.0–0.2)

## 2021-01-16 LAB — RENAL FUNCTION PANEL
Albumin: 2.8 g/dL — ABNORMAL LOW (ref 3.5–5.0)
Anion gap: 13 (ref 5–15)
BUN: 74 mg/dL — ABNORMAL HIGH (ref 6–20)
CO2: 15 mmol/L — ABNORMAL LOW (ref 22–32)
Calcium: 7.9 mg/dL — ABNORMAL LOW (ref 8.9–10.3)
Chloride: 111 mmol/L (ref 98–111)
Creatinine, Ser: 11.87 mg/dL — ABNORMAL HIGH (ref 0.44–1.00)
GFR, Estimated: 4 mL/min — ABNORMAL LOW (ref >60)
Glucose, Bld: 116 mg/dL — ABNORMAL HIGH (ref 70–99)
Phosphorus: 7.4 mg/dL — ABNORMAL HIGH (ref 2.5–4.6)
Potassium: 4.7 mmol/L (ref 3.5–5.1)
Sodium: 139 mmol/L (ref 135–145)

## 2021-01-16 LAB — HEMOGLOBIN A1C
Hgb A1c MFr Bld: 5.3 % (ref 4.8–5.6)
Mean Plasma Glucose: 105.41 mg/dL

## 2021-01-16 LAB — CBC
HCT: 22.9 % — ABNORMAL LOW (ref 36.0–46.0)
Hemoglobin: 7.5 g/dL — ABNORMAL LOW (ref 12.0–15.0)
MCH: 31.1 pg (ref 26.0–34.0)
MCHC: 32.8 g/dL (ref 30.0–36.0)
MCV: 95 fL (ref 80.0–100.0)
Platelets: 189 10*3/uL (ref 150–400)
RBC: 2.41 MIL/uL — ABNORMAL LOW (ref 3.87–5.11)
RDW: 13.6 % (ref 11.5–15.5)
WBC: 10.5 10*3/uL (ref 4.0–10.5)
nRBC: 0 % (ref 0.0–0.2)

## 2021-01-16 LAB — COMPREHENSIVE METABOLIC PANEL
ALT: 32 U/L (ref 0–44)
AST: 18 U/L (ref 15–41)
Albumin: 2.5 g/dL — ABNORMAL LOW (ref 3.5–5.0)
Alkaline Phosphatase: 57 U/L (ref 38–126)
Anion gap: 14 (ref 5–15)
BUN: 75 mg/dL — ABNORMAL HIGH (ref 6–20)
CO2: 15 mmol/L — ABNORMAL LOW (ref 22–32)
Calcium: 7.3 mg/dL — ABNORMAL LOW (ref 8.9–10.3)
Chloride: 111 mmol/L (ref 98–111)
Creatinine, Ser: 11.6 mg/dL — ABNORMAL HIGH (ref 0.44–1.00)
GFR, Estimated: 4 mL/min — ABNORMAL LOW (ref >60)
Glucose, Bld: 88 mg/dL (ref 70–99)
Potassium: 4.6 mmol/L (ref 3.5–5.1)
Sodium: 140 mmol/L (ref 135–145)
Total Bilirubin: 0.5 mg/dL (ref 0.3–1.2)
Total Protein: 5.9 g/dL — ABNORMAL LOW (ref 6.5–8.1)

## 2021-01-16 LAB — MAGNESIUM: Magnesium: 2.4 mg/dL (ref 1.7–2.4)

## 2021-01-16 LAB — HEPATITIS B CORE ANTIBODY, TOTAL: Hep B Core Total Ab: NONREACTIVE

## 2021-01-16 LAB — HEPATITIS B SURFACE ANTIGEN: Hepatitis B Surface Ag: NONREACTIVE

## 2021-01-16 LAB — TSH: TSH: 3.205 u[IU]/mL (ref 0.350–4.500)

## 2021-01-16 MED ORDER — ARIPIPRAZOLE 5 MG PO TABS
5.0000 mg | ORAL_TABLET | Freq: Every day | ORAL | Status: DC
  Administered 2021-01-17: 5 mg via ORAL
  Filled 2021-01-16: qty 1

## 2021-01-16 MED ORDER — CHLORHEXIDINE GLUCONATE CLOTH 2 % EX PADS
6.0000 | MEDICATED_PAD | Freq: Every day | CUTANEOUS | Status: DC
  Administered 2021-01-17: 6 via TOPICAL

## 2021-01-16 MED ORDER — KIDNEY FAILURE BOOK: Freq: Once | Status: AC

## 2021-01-16 MED ORDER — LIDOCAINE HCL (PF) 1 % IJ SOLN: 5.0000 mL | INTRAMUSCULAR | Status: DC | PRN

## 2021-01-16 MED ORDER — NICOTINE POLACRILEX 2 MG MT GUM
2.0000 mg | CHEWING_GUM | OROMUCOSAL | Status: DC | PRN
  Filled 2021-01-16: qty 1

## 2021-01-16 MED ORDER — ACETAMINOPHEN 325 MG PO TABS
650.0000 mg | ORAL_TABLET | Freq: Four times a day (QID) | ORAL | Status: DC | PRN
  Administered 2021-01-16: 650 mg via ORAL
  Filled 2021-01-16: qty 2

## 2021-01-16 MED ORDER — LIDOCAINE-PRILOCAINE 2.5-2.5 % EX CREA: 1.0000 "application " | TOPICAL_CREAM | CUTANEOUS | Status: DC | PRN

## 2021-01-16 MED ORDER — ARIPIPRAZOLE 2 MG PO TABS
2.0000 mg | ORAL_TABLET | Freq: Once | ORAL | Status: AC
  Administered 2021-01-16: 2 mg via ORAL
  Filled 2021-01-16 (×2): qty 1

## 2021-01-16 MED ORDER — LIDOCAINE-PRILOCAINE 2.5-2.5 % EX CREA
1.0000 "application " | TOPICAL_CREAM | CUTANEOUS | Status: DC | PRN
  Filled 2021-01-16: qty 5

## 2021-01-16 MED ORDER — HEPARIN SODIUM (PORCINE) 1000 UNIT/ML DIALYSIS: 1000.0000 [IU] | INTRAMUSCULAR | Status: DC | PRN

## 2021-01-16 MED ORDER — ARIPIPRAZOLE 2 MG PO TABS
2.0000 mg | ORAL_TABLET | Freq: Once | ORAL | Status: AC
  Administered 2021-01-16: 2 mg via ORAL
  Filled 2021-01-16: qty 1

## 2021-01-16 MED ORDER — PENTAFLUOROPROP-TETRAFLUOROETH EX AERO
1.0000 | INHALATION_SPRAY | CUTANEOUS | Status: DC | PRN
Start: 2021-01-16 — End: 2021-01-16

## 2021-01-16 MED ORDER — SODIUM CHLORIDE 0.9 % IV SOLN: 100.0000 mL | INTRAVENOUS | Status: DC | PRN

## 2021-01-16 MED ORDER — ALTEPLASE 2 MG IJ SOLR
2.0000 mg | Freq: Once | INTRAMUSCULAR | Status: DC | PRN
  Filled 2021-01-16: qty 2

## 2021-01-16 MED ORDER — ALTEPLASE 2 MG IJ SOLR: 2.0000 mg | Freq: Once | INTRAMUSCULAR | Status: DC | PRN

## 2021-01-16 MED ORDER — CHLORHEXIDINE GLUCONATE CLOTH 2 % EX PADS: 6.0000 | MEDICATED_PAD | Freq: Every day | CUTANEOUS | Status: DC

## 2021-01-16 MED ORDER — SODIUM CHLORIDE 0.9 % IV SOLN
100.0000 mL | INTRAVENOUS | Status: DC | PRN
Start: 2021-01-16 — End: 2021-01-16

## 2021-01-16 MED ORDER — LOSARTAN POTASSIUM 25 MG PO TABS
25.0000 mg | ORAL_TABLET | Freq: Every day | ORAL | Status: DC
  Administered 2021-01-16 – 2021-01-17 (×2): 25 mg via ORAL
  Filled 2021-01-16 (×2): qty 1

## 2021-01-16 MED ORDER — HEPARIN SODIUM (PORCINE) 1000 UNIT/ML DIALYSIS
1000.0000 [IU] | INTRAMUSCULAR | Status: DC | PRN
  Filled 2021-01-16: qty 1

## 2021-01-16 MED ORDER — SODIUM CHLORIDE 0.9 % IV SOLN
125.0000 mg | Freq: Once | INTRAVENOUS | Status: AC
  Administered 2021-01-16: 125 mg via INTRAVENOUS
  Filled 2021-01-16: qty 10

## 2021-01-16 MED ORDER — PENTAFLUOROPROP-TETRAFLUOROETH EX AERO: 1.0000 "application " | INHALATION_SPRAY | CUTANEOUS | Status: DC | PRN

## 2021-01-16 NOTE — Consult Note (Addendum)
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 dialysis. -With previous psychiatric history of borderline personality disorder, it is common for her to have fluctuations in mood, emotional dysregulation, and endorsing no suicidal thoughts.  Patient may also exhibit attention seeking behaviors, which will potentially impact her overall decision-making.  She does apologize for her actions yesterday. -Patient at this time denies suicidal ideations, she is appropriate in her actions, and is able to contract for safety.  She does  not appear to be an imminent risk to herself or others, therefore she does not currently meet criteria for involuntary commitment.  We will also recommend discontinuing her suicide sitter.  However keep in mind patient does have borderline personality disorder, again will fluctuate between suicidal thoughts and will need to remain on suicide precautions for her safety while in the hospital.  -At this present time patient does have the capacity to make medical decisions.  She does express much interest insight into her end-stage renal disease, and is anticipating receiving her first dialysis treatment today.  Disposition: No evidence of imminent risk to self or others at present.   Patient does not meet criteria for psychiatric inpatient admission.  Suella Broad, FNP 01/16/2021 12:42 PM

## 2021-01-16 NOTE — Progress Notes (Signed)
OT Cancellation Note  Patient Details Name: Natasha Long MRN: [id number] DOB: July 22, 1996   Cancelled Treatment:    Reason Eval/Treat Not Completed: OT screened, no needs identified, will sign off Per PT, pt independent with ADLs, mobility with no formal evaluation needed. OT to sign off.   Layla Maw 01/16/2021, 12:05 PM

## 2021-01-16 NOTE — Progress Notes (Addendum)
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  She has advanced renal insufficiency.  Last serum creatinine 12.1 mg/dL.  She had an AV fistula placed 10/19/2019 by Dr. Trula Slade in anticipation of starting dialysis.  Last outpatient labs showed a creatinine of 7.4 mg/dL.  Blood pressure 154/91 pulse 88 temperature 98.1 O2 sats 90% room air  Sodium 140 potassium 4.6 chloride 111 CO2 15 BUN 75 creatinine 11.6 phosphorus 7.6 calcium 7.3 albumin 2.5 hemoglobin 7.2 iron saturations 18%  Objective:  Vital signs in last 24 hours:  Temp:  [97.8 F (36.6 C)-98.5 F (36.9 C)] 98.5 F (36.9 C) (02/17 1051) Pulse Rate:  [82-93] 88 (02/17 1051) Resp:  [17-21] 18 (02/17 1051) BP: (110-167)/(70-92) 154/91 (02/17 1051) SpO2:  [99 %-100 %] 100 % (02/17 1051)  Weight change:  There were no vitals filed for this visit.  Intake/Output: I/O last 3 completed shifts: In: 480 [P.O.:480] Out: -    Intake/Output this shift:  No intake/output data recorded.  GEN NAD, lying in bed on her side HEENT EOMI sclerae anicteric NECK no overt JVT PULM clear bilaterally no c/w/r CV RRR no m/r/g ABD obese, soft, NABS EXT trace LE edema NEURO groggy but answers questions appropriately SKIN no rashes ACCESS: L AVF + T/B   Basic Metabolic Panel: Recent Labs  Lab 01/15/21 1318 01/16/21 0500  NA 143 140  K 4.9 4.6  CL 113* 111  CO2 18* 15*  GLUCOSE 92 88  BUN 78* 75*  CREATININE 12.01* 11.60*  CALCIUM 7.1* 7.3*  MG  --  2.4  PHOS  --  7.6*    Liver Function Tests: Recent Labs  Lab 01/15/21 1318 01/16/21 0500  AST 23 18  ALT 37 32  ALKPHOS 62 57  BILITOT 0.2* 0.5  PROT 6.4* 5.9*  ALBUMIN 2.8* 2.5*   No results for input(s): LIPASE, AMYLASE in the last 168 hours. No results for input(s): AMMONIA in the last 168 hours.  CBC: Recent Labs   Lab 01/15/21 1318 01/15/21 2100 01/16/21 0500  WBC 10.8*  --  9.8  NEUTROABS 8.2*  --  7.1  HGB 7.5* 7.4* 7.2*  HCT 23.5* 24.6* 23.3*  MCV 97.5  --  98.7  PLT 199  --  179    Cardiac Enzymes: No results for input(s): CKTOTAL, CKMB, CKMBINDEX, TROPONINI in the last 168 hours.  BNP: Invalid input(s): POCBNP  CBG: No results for input(s): GLUCAP in the last 168 hours.  Microbiology: Results for orders placed or performed during the hospital encounter of 01/15/21  Resp Panel by RT-PCR (Flu A&B, Covid) Nasopharyngeal Swab     Status: None   Collection Time: 01/15/21 12:07 PM   Specimen: Nasopharyngeal Swab; Nasopharyngeal(NP) swabs in vial transport medium  Result Value Ref Range Status   SARS Coronavirus 2 by RT PCR NEGATIVE NEGATIVE Final    Comment: (NOTE) SARS-CoV-2 target nucleic acids are NOT DETECTED.  The SARS-CoV-2 RNA is generally detectable in upper respiratory specimens during the acute phase of infection. The lowest concentration of SARS-CoV-2 viral copies this assay can detect is 138 copies/mL. A negative result does not preclude SARS-Cov-2 infection and should not be used as the sole basis for treatment or other patient management decisions. A negative result may occur with  improper specimen collection/handling, submission of specimen other than nasopharyngeal swab, presence of viral mutation(s) within the  areas targeted by this assay, and inadequate number of viral copies(<138 copies/mL). A negative result must be combined with clinical observations, patient history, and epidemiological information. The expected result is Negative.  Fact Sheet for Patients:  EntrepreneurPulse.com.au  Fact Sheet for Healthcare Providers:  IncredibleEmployment.be  This test is no t yet approved or cleared by the [place] FDA and  has been authorized for detection and/or diagnosis of SARS-CoV-2 by FDA under an Emergency Use  Authorization (EUA). This EUA will remain  in effect (meaning this test can be used) for the duration of the COVID-19 declaration under Section 564(b)(1) of the Act, 21 U.S.C.section 360bbb-3(b)(1), unless the authorization is terminated  or revoked sooner.       Influenza A by PCR NEGATIVE NEGATIVE Final   Influenza B by PCR NEGATIVE NEGATIVE Final    Comment: (NOTE) The Xpert Xpress SARS-CoV-2/FLU/RSV plus assay is intended as an aid in the diagnosis of influenza from Nasopharyngeal swab specimens and should not be used as a sole basis for treatment. Nasal washings and aspirates are unacceptable for Xpert Xpress SARS-CoV-2/FLU/RSV testing.  Fact Sheet for Patients: EntrepreneurPulse.com.au  Fact Sheet for Healthcare Providers: IncredibleEmployment.be  This test is not yet approved or cleared by the [place] FDA and has been authorized for detection and/or diagnosis of SARS-CoV-2 by FDA under an Emergency Use Authorization (EUA). This EUA will remain in effect (meaning this test can be used) for the duration of the COVID-19 declaration under Section 564(b)(1) of the Act, 21 U.S.C. section 360bbb-3(b)(1), unless the authorization is terminated or revoked.  Performed at [facility], [place] [address], [place], [place] [address]     Coagulation Studies: No results for input(s): LABPROT, INR in the last 72 hours.  Urinalysis: No results for input(s): COLORURINE, LABSPEC, PHURINE, GLUCOSEU, HGBUR, BILIRUBINUR, KETONESUR, PROTEINUR, UROBILINOGEN, NITRITE, LEUKOCYTESUR in the last 72 hours.  Invalid input(s): APPERANCEUR    Imaging: No results found.   Medications:   . sodium chloride     . calcium carbonate  200 mg of elemental calcium Oral Once  . losartan  25 mg Oral Daily  . nicotine  21 mg Transdermal Daily   nicotine polacrilex  Assessment/ Plan:   1.  Advanced CKD: Looks like she has had progression of CKD  since her last labs 10/16/20.  Some mild uremic symptoms of weight loss and itching.  No emergent indication for dialysis as K, CO2, volume seems to be OK as well.  I think it would be prudent to start dialysis at this point.  Do not think there is going to be much room for improvement..  AVF appears ready for use if needed.  Will need to involve renal navigator  2.  Suicidal ideations: currently IVC'd.  Per primary   3.  Anemia: anemia of CKD vs GI bleed vs other.  GI consulted, appreciate assistance  4.  HTN: on labetalol as OP and losartan.  Given h/o collapsing FSGS and nephrotic syndrome, would favor keeping losartan on for now  5.  Dispo: IVC    LOS: 1 Sherril Croon '@TODAY''@11'$ :51 AM

## 2021-01-16 NOTE — Progress Notes (Signed)
Family Medicine Teaching Service Daily Progress Note Intern Pager: [phone]  Patient name: Natasha Long Medical record number: [id number] Date of birth: 01/07/1996 Age: 25 y.o. Gender: female  Primary Care Provider: [name], MD Consultants: Nephrology, GI, psychiatry  Code Status: Full code   Pt Overview and Major Events to Date:  2/16: Admitted   Assessment and Plan: Natasha Long is a 25 y.o. female presenting with active suicidal ideations. PMH is significant for bipolar disorder, ESRD, hypertension, peripheral neuropathy and substance use.  GI bleed?  Normocytic Anemia Patient incidentally noted to have Hgb 7.5 on admission, baseline appears around 12-14. Hgb 7.2 this morning. Ferritin 102 wnl, saturation ratio 18. Given renal function, patient would likely benefit from iron supplementation. Likely multifactorial secondary to current menstruation and anemia of chronic disease. Fecal occult positive. Negative pregnancy test. No other source of bleeding. Currently asymptomatic otherwise. Transfusion threshold likely 7, type and screen ordered. Hold transfusion given patient lacks symptoms.  -GI consulted, to be seen this morning -monitor CBC -consider IV infusion, touch base with pharmacy regarding test dose -up with assistance -PT/OT  -incentive spirometry   Suicidal ideations Patient continues to deny both active and passive suicidal ideations although has had multiple psychiatric-related hospitalizations and a previous suicidal attempt. Patient not in restraints during the encounter.    -consider psychiatry consult when more medically stable -sitter present -Reassess IVC 02/19  Hypertension Normotensive this morning 114/71. On admission 167/75. Home medications include Losartan 25 mg. -home losartan 25 mg daily  Advanced CKD secondary to FSGS Chronic and stable. Cr 11.60 and GFR 4, compared to Cr 12.01 on admission. Likely prerenal due to poor perfusion  in the setting of chronic hypertension. Secondary to focal segmental glomerular nephrosis. Baseline appears to be around 4. Patient progressed from CKD stage 4 to ESRD, reports that she has not started dialysis yet but is scheduled to start on 01/20/2021.  -nephrology following, no emergent dialysis needed but likely will get it soon  -monitor I/Os -restarted home losartan 25 mg daily per nephrology  Leukocytosis Likely resolved, WBC 9.8 wnl. Admitted WBC 10.8, likely reactive.  -monitor clinically for signs of infection  Hypocalcemia likely secondary to CK Ca 7.3 this morning with correct Ca 8.5 compared to Ca 7.1 on admission, corrected Ca 8.1. Mg wnl. Phosphorus 7.6. Likely secondary to poor renal function. -defer management to nephrology, appreciate involvement  -PTH -monitor with CMP  Bipolar Disorder  Anxiety Home medications include duloxetine 20 mg daily. -consulted psychiatry, appreciate recommendations  -hold home meds  Substance use UDS positive THC. -TOC consulted for education assistance   Tobacco use Current smoker. -nicotine patch 21 mg as desired   Unstable living situation Currently homeless, prior unstable living situation.  -TOC consulted to ensure safe disposition   FEN/GI: NPO PPx: SCDs   Status is: Inpatient  Remains inpatient appropriate because:Ongoing diagnostic testing needed not appropriate for outpatient work up   Dispo: The patient is from: [facility]               Anticipated d/c is to: [facility]              Anticipated d/c date is: 2 days              Patient currently is not medically stable to d/c.   Difficult to place patient No        Subjective:  No acute overnight events reported. Evaluated patient at bedside along with pharmacist, Natasha Long. Patient denies dyspnea,  chest pain and other pain. She is tearful as she thought she was supposed to go to behavioral health but I explained that she had some abnormal blood work that  needed further work up. Reports that she does not mind having a sitter with her at all times. She understands and agrees with current plan, no further concerns at this time.   Objective: Temp:  [97.8 F (36.6 C)-98 F (36.7 C)] 98 F (36.7 C) (02/17 0212) Pulse Rate:  [82-93] 93 (02/17 0212) Resp:  [17-21] 21 (02/17 0212) BP: (110-167)/(70-92) 114/71 (02/17 0212) SpO2:  [99 %-100 %] 99 % (02/17 0212) Physical Exam: General: Patient sitting upright in bed, in no acute distress. Cardiovascular: RRR, no murmurs or gallops auscultated  Respiratory: lungs clear to auscultation bilaterally, normal WOB, breathing comfortably on room air Abdomen: soft, nontender, presence of active bowel sounds Extremities: no LE edema, radial pulses strong and equal bilaterally Neuro: AOx3, gross sensation intact, 5/5 UE and LE strength bilaterally Psych: tearful intermittently throughout encounter, denies SI or plan   Laboratory: Recent Labs  Lab 01/15/21 1318 01/15/21 2100 01/16/21 0500  WBC 10.8*  --  9.8  HGB 7.5* 7.4* 7.2*  HCT 23.5* 24.6* 23.3*  PLT 199  --  179   Recent Labs  Lab 01/15/21 1318  NA 143  K 4.9  CL 113*  CO2 18*  BUN 78*  CREATININE 12.01*  CALCIUM 7.1*  PROT 6.4*  BILITOT 0.2*  ALKPHOS 62  ALT 37  AST 23  GLUCOSE 92      Imaging/Diagnostic Tests: No results found.  Natasha Dice, DO 01/16/2021, 6:28 AM PGY-1, [place] Intern pager: [phone], text pages welcome

## 2021-01-16 NOTE — Evaluation (Signed)
Physical Therapy Evaluation and Discharge Patient Details Name: Natasha Long MRN: [id number] DOB: September 17, 1996 Today's Date: 01/16/2021   History of Present Illness  Pt is a 25 y/o female admitted secondary to suicidal ideation. Pt also found to have anemia and progressed CKD. PMH includes bipolar disorder, HTN, PTSD, and CKD.  Clinical Impression  Patient evaluated by Physical Therapy with no further acute PT needs identified. All education has been completed and the patient has no further questions. Pt overall independent with mobility tasks. Able to perform DGI tasks without LOB; scored 24 indicating low fall risk. Reviewed importance of ambulating with staff during admission. See below for any follow-up Physical Therapy or equipment needs. PT is signing off. Thank you for this referral. If needs change, please re-consult.      Follow Up Recommendations No PT follow up    Equipment Recommendations  None recommended by PT    Recommendations for Other Services       Precautions / Restrictions Precautions Precautions: Other (comment) Precaution Comments: suicide sitter Restrictions Weight Bearing Restrictions: No      Mobility  Bed Mobility Overal bed mobility: Independent                  Transfers Overall transfer level: Independent                  Ambulation/Gait Ambulation/Gait assistance: Independent Gait Distance (Feet): 300 Feet Assistive device: None Gait Pattern/deviations: WFL(Within Functional Limits) Gait velocity: WFL   General Gait Details: Able to perform DGI tasks without LOB noted. Good gait speed. Overall WFL.  Stairs            Wheelchair Mobility    Modified Rankin (Stroke Patients Only)       Balance Overall balance assessment: Independent                               Standardized Balance Assessment Standardized Balance Assessment : Dynamic Gait Index   Dynamic Gait Index Level Surface:  Normal Change in Gait Speed: Normal Gait with Horizontal Head Turns: Normal Gait with Vertical Head Turns: Normal Gait and Pivot Turn: Normal Step Over Obstacle: Normal Step Around Obstacles: Normal Steps: Normal Total Score: 24       Pertinent Vitals/Pain Pain Assessment: No/denies pain    Home Living Family/patient expects to be discharged to:: Other (Comment) (inpatient psych?)                      Prior Function Level of Independence: Independent               Hand Dominance        Extremity/Trunk Assessment   Upper Extremity Assessment Upper Extremity Assessment: Overall WFL for tasks assessed    Lower Extremity Assessment Lower Extremity Assessment: Overall WFL for tasks assessed    Cervical / Trunk Assessment Cervical / Trunk Assessment: Normal  Communication   Communication: No difficulties  Cognition Arousal/Alertness: Awake/alert Behavior During Therapy: Flat affect Overall Cognitive Status: Within Functional Limits for tasks assessed                                        General Comments      Exercises     Assessment/Plan    PT Assessment Patent does not need any further PT services  PT  Problem List         PT Treatment Interventions      PT Goals (Current goals can be found in the Care Plan section)  Acute Rehab PT Goals Patient Stated Goal: none stated PT Goal Formulation: All assessment and education complete, DC therapy Time For Goal Achievement: 01/16/21 Potential to Achieve Goals: Good    Frequency     Barriers to discharge        Co-evaluation               AM-PAC PT "6 Clicks" Mobility  Outcome Measure Help needed turning from your back to your side while in a flat bed without using bedrails?: None Help needed moving from lying on your back to sitting on the side of a flat bed without using bedrails?: None Help needed moving to and from a bed to a chair (including a wheelchair)?:  None Help needed standing up from a chair using your arms (e.g., wheelchair or bedside chair)?: None Help needed to walk in hospital room?: None Help needed climbing 3-5 steps with a railing? : None 6 Click Score: 24    End of Session   Activity Tolerance: Patient tolerated treatment well Patient left: in bed;with call bell/phone within reach;with nursing/sitter in room (on bed in ED) Nurse Communication: Mobility status PT Visit Diagnosis: Other abnormalities of gait and mobility (R26.89)    [id number] PT Time Calculation (min) (ACUTE ONLY): 10 min   Charges:   PT Evaluation $PT Eval Low Complexity: 1 Low          Lou Miner, DPT  Acute Rehabilitation Services  Pager: [phone] Office: [phone]   Rudean Hitt 01/16/2021, 10:42 AM

## 2021-01-16 NOTE — Plan of Care (Signed)
  Problem: Safety: Goal: Violent Restraint(s) Outcome: Progressing   Problem: Education: Goal: Knowledge of General Education information will improve Description: Including pain rating scale, medication(s)/side effects and non-pharmacologic comfort measures Outcome: Progressing   Problem: Health Behavior/Discharge Planning: Goal: Ability to manage health-related needs will improve Outcome: Progressing   Problem: Clinical Measurements: Goal: Ability to maintain clinical measurements within normal limits will improve Outcome: Progressing Goal: Will remain free from infection Outcome: Progressing Goal: Diagnostic test results will improve Outcome: Progressing Goal: Respiratory complications will improve Outcome: Progressing Goal: Cardiovascular complication will be avoided Outcome: Progressing   Problem: Activity: Goal: Risk for activity intolerance will decrease Outcome: Progressing   Problem: Nutrition: Goal: Adequate nutrition will be maintained Outcome: Progressing   Problem: Coping: Goal: Level of anxiety will decrease Outcome: Progressing   Problem: Elimination: Goal: Will not experience complications related to bowel motility Outcome: Progressing Goal: Will not experience complications related to urinary retention Outcome: Progressing   Problem: Pain Managment: Goal: General experience of comfort will improve Outcome: Progressing   Problem: Safety: Goal: Ability to remain free from injury will improve Outcome: Progressing   Problem: Skin Integrity: Goal: Risk for impaired skin integrity will decrease Outcome: Progressing   Problem: Fluid Volume: Goal: Compliance with measures to maintain balanced fluid volume will improve Outcome: Progressing   Problem: Health Behavior/Discharge Planning: Goal: Ability to manage health-related needs will improve Outcome: Progressing   Problem: Nutritional: Goal: Ability to make healthy dietary choices will  improve Outcome: Progressing   Problem: Clinical Measurements: Goal: Complications related to the disease process, condition or treatment will be avoided or minimized Outcome: Progressing

## 2021-01-16 NOTE — Progress Notes (Signed)
Rounded on patient today in correlation to transition to outpatient HD from referral from Dr. Justin Mend. Patient was found in HD, tolerating her first treatment well. Permission granted to this RN to have this conversation in regards to starting HD. Patient reports that she has been preparing to start treatment and has goal to either receive a transplant or go on peritoneal dialysis.  Ordered consult to dietician and Kidney Failure book. Patient educated at the bedside regarding AV fistula site care, assessment of thrill daily and proper medication administration on HD days.  Patient also educated on the importance of adhering to scheduled dialysis treatments, the effects of fluid overload, hyperkalemia and hyperphosphatemia.   Patient with dietary specific questions that I have prompted her to ask of the dietician. She also verbalizes current weight loss as one of her goals. Patient capable of re-verbalizing via teach back method. Also educated patient on services available through the interdisciplinary team in the clinic setting and the differences they will note when transitioning to the outpatient setting from the hospital. Patient with no further questions at this time. Handouts and contact information provided to patient for any further assistance. Will follow as appropriate.   Dorthey Sawyer, RN  Dialysis Nurse Coordinator Phone: [phone]

## 2021-01-17 DIAGNOSIS — N051 Unspecified nephritic syndrome with focal and segmental glomerular lesions: Secondary | ICD-10-CM

## 2021-01-17 DIAGNOSIS — D631 Anemia in chronic kidney disease: Secondary | ICD-10-CM

## 2021-01-17 LAB — PARATHYROID HORMONE, INTACT (NO CA): PTH: 676 pg/mL — ABNORMAL HIGH (ref 15–65)

## 2021-01-17 LAB — HEPATITIS B SURFACE ANTIBODY, QUANTITATIVE: Hep B S AB Quant (Post): <3.1 m[IU]/mL — ABNORMAL LOW (ref >9.9)

## 2021-01-17 MED ORDER — GABAPENTIN 100 MG PO CAPS: 100.0000 mg | ORAL_CAPSULE | Freq: Two times a day (BID) | ORAL | 0 refills | Status: DC

## 2021-01-17 MED ORDER — ARIPIPRAZOLE 5 MG PO TABS: 5.0000 mg | ORAL_TABLET | Freq: Every day | ORAL | 0 refills | Status: DC

## 2021-01-17 NOTE — Discharge Summary (Addendum)
[facility] Discharge Summary  Patient name: Natasha Long Medical record number: [id number] Date of birth: Oct 18, 1996 Age: 25 y.o. 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 Cardiovascular: RRR, no murmurs auscultated  Respiratory: CTAB, normal WOB Abdomen: soft, nontender, presence of active bowel sounds Extremities: no LE edema noted, radial and distal pulses strong and equal bilaterally  Neuro: alert and follows commands Psych: mood appropriate, denies SI and plan  Brief Hospital Course:  Natasha Long is a 25 y.o. female presenting with active suicidal ideations. PMH is significant for bipolar disorder, ESRD, hypertension, peripheral neuropathy and substance use.   Normocytic Anemia Incidental finding.  Admission hemoglobin 7.5; baseline appears to be 12-14. Fecal occult positive. Negative pregnancy test.  Iron studies demonstrated ferritin 102 and low saturation ratio. Likely multifactorial secondary to current menstruation, anemia of chronic disease and iron deficiency anemia. Patient did not  require blood transfusion while admitted but given IV iron infusion. GI consulted, recommended anusol with no further inpatient intervention. If persists, recommends outpatient GI for possible colonoscopy or sigmoidoscopy.    Suicidal ideations Presented under IVC from [employer] for active suicidal ideations. Safety precautions and sitter used during hospital stay. Psychiatry consulted, recommended discontinuing IVC. Psychiatry determined that patient did not meet criteria for inpatient psychiatric hospitalization. Patient started on Abilify and dose increased to 5 mg per psychiatry recommendations..  At time of discharge, patient stable and without suicidal ideation.     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 Patient progressed from CKD stage 4 to ESRD, but not yet started on dialysis. Nephrology consulted, recommended dialysis although non-emergent but that starting would be in patient's best interests.  Patient received hemodialysis while admitted once on 2/17. Renal Navigator contacted to set up outpatient dialysis.     Issues for Follow Up:  1. Patient would benefit from outpatient psychiatry follow up. 2. Follow up with nephrology outpatient for regular hemodialysis sessions.  3. Follow up with PCP for BP monitoring, discharged on losartan 25 mg.  4. If rectal bleeding noted, patient may benefit from GI outpatient follow up with sigmoidoscopy or colonoscopy.  5. Follow up with PCP for mood check, history of active suicidal ideations with prior attempt.  6. Follow up with PCP for repeat Hgb.   Significant Procedures:  First HD session: 2/17  Significant Labs and Imaging:  Recent Labs  Lab 01/15/21 1318 01/15/21 2100 01/16/21 0500 01/16/21 1215  WBC 10.8*  --  9.8 10.5  HGB 7.5* 7.4* 7.2* 7.5*  HCT  23.5* 24.6* 23.3* 22.9*  PLT 199  --  179 189   Recent Labs  Lab 01/15/21 1318 01/16/21 0500 01/16/21 1215  NA 143 140 139  K 4.9 4.6 4.7  CL 113* 111 111  CO2 18* 15* 15*  GLUCOSE 92 88 116*  BUN 78* 75* 74*  CREATININE 12.01* 11.60* 11.87*  CALCIUM 7.1* 7.3* 7.9*  MG  --  2.4  --   PHOS  --  7.6* 7.4*  ALKPHOS 62 57  --   AST 23 18  --   ALT 37 32  --   ALBUMIN 2.8* 2.5* 2.8*      Results/Tests Pending at Time of Discharge:  none  Discharge Medications:  Allergies as of 01/17/2021      Reactions   Prozac [fluoxetine Hcl] Other (See Comments)   Panic attack    Wellbutrin [bupropion] Other (See Comments)   Panic attack   Prednisone Other (See Comments)   Pt states that this med caused pancreatitis.       Medication List    STOP taking these medications   DULoxetine 20 MG capsule Commonly known as: CYMBALTA   furosemide 80 MG tablet Commonly known as: LASIX   gabapentin 600 MG tablet Commonly known as: NEURONTIN Replaced by: gabapentin 100 MG capsule   labetalol 200 MG tablet Commonly known as: NORMODYNE   metroNIDAZOLE 500 MG tablet Commonly known as: FLAGYL     TAKE these medications   ARIPiprazole 5 MG tablet Commonly known as: ABILIFY Take 1 tablet (5 mg total) by mouth daily. Start taking on: January 18, 2021   Blood Pressure Monitor 7 Devi 1 Units by Does not apply route daily. Measure blood pressure daily   calcitRIOL 0.25 MCG capsule Commonly known as: ROCALTROL Take 0.25 mcg by mouth every Monday, Wednesday, and Friday.   gabapentin 100 MG capsule Commonly known as: Neurontin Take 1 capsule (100 mg total) by mouth 2 (two) times daily. Replaces: gabapentin 600 MG tablet   HYDROcodone-acetaminophen 5-325 MG tablet Commonly known as: NORCO/VICODIN Take 1 tablet by mouth every 4 (four) hours as needed for moderate pain.   losartan 25 MG tablet Commonly known as: COZAAR Take 25 mg by mouth daily.   naphazoline-glycerin 0.012-0.2  % Soln Commonly known as: CLEAR EYES REDNESS 1-2 drops 4 (four) times daily as needed for eye irritation.   ondansetron 4 MG tablet Commonly known as: Zofran Take 1 tablet (4 mg total) by mouth every 8 (eight) hours as needed for nausea or vomiting.   sodium bicarbonate 650 MG tablet Take 1,300 mg by mouth daily.       Discharge Instructions: Please refer to Patient Instructions section of EMR for full details.  Patient was counseled important signs and symptoms that should prompt return to medical care, changes in medications, dietary instructions, activity restrictions, and follow up appointments.   Follow-Up Appointments:   Donney Dice, DO 01/17/2021, 6:34 PM PGY-1, [facility] Upper-Level Resident Addendum   I have independently interviewed and examined the patient. I have discussed the above with the original author and agree with their documentation. Please see also any attending notes.    Carollee Leitz MD PGY-2, [facility] 01/17/2021 8:55 PM  [place] Service pager: [phone] (text pages welcome through [place])

## 2021-01-17 NOTE — Progress Notes (Signed)
Patient discharged home.  Discharge instructions explained, pt verbalizes understanding.

## 2021-01-17 NOTE — Progress Notes (Signed)
Renal Navigator received referral for outpatient HD from Dr. Justin Mend yesterday afternoon, however, patient had not had first HD treatment yet, and patient was still under IVC. Navigator planned to follow up with patient today, however, she was no longer in the hospital. Navigator received call from Dr. Pilar Plate, who states IVC was rescinded and patient left AMA.  Renal Navigator reviewed chart and made referral to [facility] for outpatient HD treatment for ESRD. Navigator will continue to follow for seat acceptance and attempt to follow up with patient. Per MD, patient has been instructed to follow up in the ED as needed.   Alphonzo Cruise, [place] Renal Navigator [phone]

## 2021-01-17 NOTE — Progress Notes (Signed)
Patient seen early this AM when paged about leaving AMA. Patient's IVC has been rescinded. Denies SI/HI. Simply wants to return to shelter. Reports symptoms of anxiety in hospital. Offered to mediate these factors by reducing nursing checks overnight. Patient able to voice risks of leaving, discussed at length. Reviewed reasons to return.  Dorris Singh, MD  Family Medicine Teaching Service

## 2021-01-17 NOTE — Progress Notes (Addendum)
[facility] ROUNDING NOTE   Subjective:   Interval History: This is a 25 year old lady with hypertension bipolar disorder and advanced CKD stage V.  Secondary to collapsing FSGS.  She has advanced renal insufficiency.  Last serum creatinine 12.1 mg/dL.  She had an AV fistula placed 10/19/2019 by Dr. Trula Slade in anticipation of starting dialysis.  Last outpatient labs showed a creatinine of 7.4 mg/dL.  Patient states she feels well today.  No longer having suicidal ideation.  IVC has been lifted.  Tolerated dialysis on 2/17.  Plan for dialysis today but the patient is leaving AMA.  We discussed risk of leaving at this time and she understands the risks  Objective:  Vital signs in last 24 hours:  Temp:  [97.5 F (36.4 C)-98.9 F (37.2 C)] 97.5 F (36.4 C) (02/18 0748) Pulse Rate:  [80-98] 84 (02/18 0748) Resp:  [16-18] 18 (02/18 0748) BP: (123-156)/(62-94) 156/89 (02/18 0748) SpO2:  [100 %] 100 % (02/18 0748) Weight:  [126.4 kg-131.8 kg] 131.8 kg (02/18 0610)  Weight change:  Filed Weights   01/16/21 1635 01/16/21 2017 01/17/21 0610  Weight: 126.4 kg 126.4 kg 131.8 kg    Intake/Output: I/O last 3 completed shifts: In: 480 [P.O.:480] Out: 1100 [Other:1100]   Intake/Output this shift:  No intake/output data recorded.  GEN NAD, sitting in chair, no distress HEENT EOMI sclerae anicteric NECK no overt JVT PULM clear bilaterally no c/w/r CV RRR no m/r/g ABD obese, soft, NABS EXT trace LE edema NEURO awake, alert, answers questions appropriately SKIN no rashes ACCESS: L AVF + T/B   Basic Metabolic Panel: Recent Labs  Lab 01/15/21 1318 01/16/21 0500 01/16/21 1215  NA 143 140 139  K 4.9 4.6 4.7  CL 113* 111 111  CO2 18* 15* 15*  GLUCOSE 92 88 116*  BUN 78* 75* 74*  CREATININE 12.01* 11.60* 11.87*  CALCIUM 7.1* 7.3* 7.9*  MG  --  2.4  --   PHOS  --  7.6* 7.4*    Liver Function Tests: Recent Labs  Lab 01/15/21 1318 01/16/21 0500 01/16/21 1215   AST 23 18  --   ALT 37 32  --   ALKPHOS 62 57  --   BILITOT 0.2* 0.5  --   PROT 6.4* 5.9*  --   ALBUMIN 2.8* 2.5* 2.8*   No results for input(s): LIPASE, AMYLASE in the last 168 hours. No results for input(s): AMMONIA in the last 168 hours.  CBC: Recent Labs  Lab 01/15/21 1318 01/15/21 2100 01/16/21 0500 01/16/21 1215  WBC 10.8*  --  9.8 10.5  NEUTROABS 8.2*  --  7.1  --   HGB 7.5* 7.4* 7.2* 7.5*  HCT 23.5* 24.6* 23.3* 22.9*  MCV 97.5  --  98.7 95.0  PLT 199  --  179 189    Cardiac Enzymes: No results for input(s): CKTOTAL, CKMB, CKMBINDEX, TROPONINI in the last 168 hours.  BNP: Invalid input(s): POCBNP  CBG: No results for input(s): GLUCAP in the last 168 hours.  Microbiology: Results for orders placed or performed during the hospital encounter of 01/15/21  Resp Panel by RT-PCR (Flu A&B, Covid) Nasopharyngeal Swab     Status: None   Collection Time: 01/15/21 12:07 PM   Specimen: Nasopharyngeal Swab; Nasopharyngeal(NP) swabs in vial transport medium  Result Value Ref Range Status   SARS Coronavirus 2 by RT PCR NEGATIVE NEGATIVE Final    Comment: (NOTE) SARS-CoV-2 target nucleic acids are NOT DETECTED.  The SARS-CoV-2 RNA is generally  detectable in upper respiratory specimens during the acute phase of infection. The lowest concentration of SARS-CoV-2 viral copies this assay can detect is 138 copies/mL. A negative result does not preclude SARS-Cov-2 infection and should not be used as the sole basis for treatment or other patient management decisions. A negative result may occur with  improper specimen collection/handling, submission of specimen other than nasopharyngeal swab, presence of viral mutation(s) within the areas targeted by this assay, and inadequate number of viral copies(<138 copies/mL). A negative result must be combined with clinical observations, patient history, and epidemiological information. The expected result is Negative.  Fact Sheet for  Patients:  EntrepreneurPulse.com.au  Fact Sheet for Healthcare Providers:  IncredibleEmployment.be  This test is no t yet approved or cleared by the [place] FDA and  has been authorized for detection and/or diagnosis of SARS-CoV-2 by FDA under an Emergency Use Authorization (EUA). This EUA will remain  in effect (meaning this test can be used) for the duration of the COVID-19 declaration under Section 564(b)(1) of the Act, 21 U.S.C.section 360bbb-3(b)(1), unless the authorization is terminated  or revoked sooner.       Influenza A by PCR NEGATIVE NEGATIVE Final   Influenza B by PCR NEGATIVE NEGATIVE Final    Comment: (NOTE) The Xpert Xpress SARS-CoV-2/FLU/RSV plus assay is intended as an aid in the diagnosis of influenza from Nasopharyngeal swab specimens and should not be used as a sole basis for treatment. Nasal washings and aspirates are unacceptable for Xpert Xpress SARS-CoV-2/FLU/RSV testing.  Fact Sheet for Patients: EntrepreneurPulse.com.au  Fact Sheet for Healthcare Providers: IncredibleEmployment.be  This test is not yet approved or cleared by the [place] FDA and has been authorized for detection and/or diagnosis of SARS-CoV-2 by FDA under an Emergency Use Authorization (EUA). This EUA will remain in effect (meaning this test can be used) for the duration of the COVID-19 declaration under Section 564(b)(1) of the Act, 21 U.S.C. section 360bbb-3(b)(1), unless the authorization is terminated or revoked.  Performed at [facility], [place] [address] [place], [place] [address]     Coagulation Studies: No results for input(s): LABPROT, INR in the last 72 hours.  Urinalysis: No results for input(s): COLORURINE, LABSPEC, PHURINE, GLUCOSEU, HGBUR, BILIRUBINUR, KETONESUR, PROTEINUR, UROBILINOGEN, NITRITE, LEUKOCYTESUR in the last 72 hours.  Invalid input(s): APPERANCEUR     Imaging: No results found.   Medications:   . sodium chloride     . ARIPiprazole  5 mg Oral Daily  . calcium carbonate  200 mg of elemental calcium Oral Once  . Chlorhexidine Gluconate Cloth  6 each Topical Q0600  . losartan  25 mg Oral Daily  . nicotine  21 mg Transdermal Daily   acetaminophen, nicotine polacrilex  Assessment/ Plan:   1.  Advanced CKD now ESRD: Looks like she has had progression of CKD since her last labs 10/16/20.  Some mild uremic symptoms of weight loss and itching.  Started on dialysis on 2/17.  Plan for dialysis today but the patient is leaving AMA.  Renal navigator has been notified.  Hopefully can set patient up with dialysis outpatient.  She may return to the ER for dialysis in the next couple days  2.  Suicidal ideations: Improved.  Leaving AMA.  Management per primary  3.  Anemia: anemia of CKD vs GI bleed vs other.  GI consulted the patient is leaving at this time  4.  HTN: on labetalol as OP and losartan.  Given h/o collapsing FSGS and nephrotic  syndrome, would favor keeping losartan on for now  5.  Dispo: Leaving AMA    LOS: 2 Reesa Chew '@TODAY''@12'$ :58 PM

## 2021-01-17 NOTE — Discharge Instructions (Signed)
End-stage renal disease While you are in the hospital, you started to receive dialysis.  Your dialysis schedule will be Tuesday, Thursday and Saturday every week.  It is important for you to get dialysis on these days.  We tried to set you up with an outpatient dialysis chair but we were not able to complete that before you left.  It is important that you return to the hospital for dialysis until you are set up with a chair at an outpatient dialysis center.  GI bleeding There was concern for your low blood levels while you are in the hospital.  We spoke with the gastroenterologist, the stomach doctor, who said that there was no need for additional interventions at this time.  Your bleeding is likely related to your hemorrhoids.  Suicidal thoughts Initially, there was concern about thoughts of hurting herself.  While you are here, it seems that these thoughts had improved.  You were seen by psychiatry who recommended restarting her Abilify 5 mg daily.  It will be important for you to continue taking this medication every day.  You are leaving the hospital [place].  This means that there is still some things that we would like to do during her hospital stay.  The worse case scenario of leaving the hospital early's death.  During our conversation, he did acknowledge an understanding of the situation.  Please return to the hospital if you do have any concerns.

## 2021-01-18 ENCOUNTER — Other Ambulatory Visit: Payer: Self-pay

## 2021-01-18 ENCOUNTER — Emergency Department (HOSPITAL_COMMUNITY)
Admission: EM | Admit: 2021-01-18 | Discharge: 2021-01-18 | Disposition: A | Discharge status: Home / Self Care | Payer: Medicaid Other | Attending: Emergency Medicine | Admitting: Emergency Medicine

## 2021-01-18 DIAGNOSIS — Z992 Dependence on renal dialysis: Secondary | ICD-10-CM | POA: Diagnosis not present

## 2021-01-18 DIAGNOSIS — Z79899 Other long term (current) drug therapy: Secondary | ICD-10-CM | POA: Insufficient documentation

## 2021-01-18 DIAGNOSIS — F17210 Nicotine dependence, cigarettes, uncomplicated: Secondary | ICD-10-CM | POA: Diagnosis not present

## 2021-01-18 DIAGNOSIS — N186 End stage renal disease: Secondary | ICD-10-CM | POA: Diagnosis not present

## 2021-01-18 DIAGNOSIS — Z4931 Encounter for adequacy testing for hemodialysis: Secondary | ICD-10-CM | POA: Diagnosis present

## 2021-01-18 DIAGNOSIS — I120 Hypertensive chronic kidney disease with stage 5 chronic kidney disease or end stage renal disease: Secondary | ICD-10-CM | POA: Insufficient documentation

## 2021-01-18 DIAGNOSIS — Z20822 Contact with and (suspected) exposure to covid-19: Secondary | ICD-10-CM | POA: Insufficient documentation

## 2021-01-18 LAB — TYPE AND SCREEN
ABO/RH(D): A NEG
Antibody Screen: NEGATIVE
Unit division: 0

## 2021-01-18 LAB — CBC WITH DIFFERENTIAL/PLATELET
Abs Immature Granulocytes: 0.04 10*3/uL (ref 0.00–0.07)
Basophils Absolute: 0 10*3/uL (ref 0.0–0.1)
Basophils Relative: 0 %
Eosinophils Absolute: 0.3 10*3/uL (ref 0.0–0.5)
Eosinophils Relative: 3 %
HCT: 28 % — ABNORMAL LOW (ref 36.0–46.0)
Hemoglobin: 8.5 g/dL — ABNORMAL LOW (ref 12.0–15.0)
Immature Granulocytes: 0 %
Lymphocytes Relative: 25 %
Lymphs Abs: 2.3 10*3/uL (ref 0.7–4.0)
MCH: 29.9 pg (ref 26.0–34.0)
MCHC: 30.4 g/dL (ref 30.0–36.0)
MCV: 98.6 fL (ref 80.0–100.0)
Monocytes Absolute: 0.5 10*3/uL (ref 0.1–1.0)
Monocytes Relative: 6 %
Neutro Abs: 6 10*3/uL (ref 1.7–7.7)
Neutrophils Relative %: 66 %
Platelets: 215 10*3/uL (ref 150–400)
RBC: 2.84 MIL/uL — ABNORMAL LOW (ref 3.87–5.11)
RDW: 13.5 % (ref 11.5–15.5)
WBC: 9.2 10*3/uL (ref 4.0–10.5)
nRBC: 0 % (ref 0.0–0.2)

## 2021-01-18 LAB — BASIC METABOLIC PANEL
Anion gap: 12 (ref 5–15)
BUN: 55 mg/dL — ABNORMAL HIGH (ref 6–20)
CO2: 21 mmol/L — ABNORMAL LOW (ref 22–32)
Calcium: 8.2 mg/dL — ABNORMAL LOW (ref 8.9–10.3)
Chloride: 107 mmol/L (ref 98–111)
Creatinine, Ser: 10.72 mg/dL — ABNORMAL HIGH (ref 0.44–1.00)
GFR, Estimated: 5 mL/min — ABNORMAL LOW (ref >60)
Glucose, Bld: 96 mg/dL (ref 70–99)
Potassium: 4.4 mmol/L (ref 3.5–5.1)
Sodium: 140 mmol/L (ref 135–145)

## 2021-01-18 LAB — BPAM RBC
Blood Product Expiration Date: 202202262359
Unit Type and Rh: 600

## 2021-01-18 LAB — RESP PANEL BY RT-PCR (FLU A&B, COVID) ARPGX2
Influenza A by PCR: NEGATIVE
Influenza B by PCR: NEGATIVE
SARS Coronavirus 2 by RT PCR: NEGATIVE

## 2021-01-18 MED ORDER — CHLORHEXIDINE GLUCONATE CLOTH 2 % EX PADS: 6.0000 | MEDICATED_PAD | Freq: Every day | CUTANEOUS | Status: DC

## 2021-01-18 NOTE — ED Triage Notes (Signed)
Pt. Stated, Im here for dialysis. Last one was on Thursday. No symptoms

## 2021-01-18 NOTE — Progress Notes (Addendum)
25 year old female with history of collapsing FSGS.  Patient left AMA yesterday.  She was certainly not told that she could receive dialysis intermittently through the emergency department despite her telling the story.  We are in the process of trying to set her up for dialysis outpatient.  We continue to recommend admission until she has outpatient dialysis set up.  We will try to provide dialysis today but if her labs do not show an emergent indication this may be delayed due to numerous inpatients who also need dialysis today. I cannot promise dialysis will be done today. A repeat COVID test is required to come up to the dialysis unit.  Orders for dialysis placed.  We will see the patient formally if she remains inpatient.

## 2021-01-18 NOTE — Discharge Instructions (Addendum)
Thank you for coming to the hospital today so we could check your blood work.  The good news is, it looks like your electrolytes are not dangerous and there is not evidence that you have too much fluid on your lungs.  Ideally, we would like to keep you in the hospital we will get you set up for outpatient dialysis.  It looks like that plan will not work so we are going to arrange to have labs done this coming Monday.    You should have labs done by her kidney doctor on Monday.  If you do not hear from your kidney doctor by Monday, please call them on the following number: [phone]  In the meantime, if you notice that you have significant nausea, stomach discomfort, muscle cramping or shortness of breath, please return to the emergency room to check your electrolytes.  If you experience these symptoms, you may be in need of dialysis urgently.  I think it would be a good idea to establish care at the family medicine clinic.  If you see a provider at this clinic, we can have the same doctors rounding on you if you need to come into the hospital.  I think this would be very helpful given your medical issues.  I am going to try to schedule an appointment with me in the next week.  I am not sure if this will be possible.  Please keep an eye on my chart to see when we can schedule you for an appointment.

## 2021-01-18 NOTE — ED Notes (Signed)
Pt disconnected herself from cardiac, pulse ox and bp monitoring.

## 2021-01-18 NOTE — Progress Notes (Signed)
[facility] Discharge Summary  Patient name: Natasha Long Medical record number: [id number] Date of birth: Jul 10, 1996 Age: 25 y.o. Gender: female Date of Admission: 01/18/2021  Date of Discharge: 01/18/2021 Admitting Physician: No admitting provider for patient encounter.  Primary Care Provider: [name], MD Consultants: Nephro  Indication for Hospitalization: none  Discharge Diagnoses/Problem List:  ESRD, first dialysis session 2/17  Disposition: Discharge home  Discharge Condition: Stable  Discharge Exam:  General: Alert and cooperative and appears to be in no acute distress.  Lying comfortably on the exam bed.  Able to sit up comfortably without discomfort.  Able to walk comfortably around the exam room. HEENT: No elevated JVD Cardio: Normal S1 and S2, no S3 or S4. Rhythm is regular. No murmurs or rubs.   Pulm: Clear to auscultation bilaterally, no crackles, wheezing, or diminished breath sounds. Normal respiratory effort Abdomen: Bowel sounds normal. Abdomen soft and non-tender.  Extremities: No peripheral edema. Warm/ well perfused.  Strong radial pulses. 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 Kuroda first dialysis session was 2/17.  She was and admitted to hospital patient at that time and was undergoing the CLIP process for appropriate placement with an outpatient dialysis chair.  During her time in the hospital, she became frustrated with her hospital care and her frequent vital sign checks.  Despite our conversations, she was not willing to stay for the remainder of her clip placement process.  She decided to leave the hospital [place] to stay at her homeless shelter for the duration of her CLIP process, until she could be set up with an outpatient dialysis chair.  She presented to the ED today, anticipating that she would need dialysis.,   Her first dialysis session was Thursday so she anticipated that her typical schedule would be Tuesday, Thursday, Saturday.  Blood work in the ED showed no significant electrolyte abnormalities.  Physical exam showed no evidence of fluid overload.  Nephrology noted that there was no reason for emergent dialysis in the hospital at this time.  Nephrology continues to recommend that she be admitted to the inpatient service for the duration of her CLIP placement.  She is not willing to be admitted at this time.  Working with nephrology, we put together a plan to assess her electrolytes in the outpatient setting to monitor for fluid overload and electrolyte abnormalities as safely as possible if she is not willing to stay in the inpatient setting.  Dr. Osborne Casco, with nephrology, noted that he will attempt to set her up with labs with nephrology in the outpatient setting on Monday to assess her need for urgent dialysis.  Additionally, arrangements were made to have her seen by the family medicine residency clinic in order to establish better continuity of care.  This plan was explained verbally to Ms. Cai in the emergency room.  She was asked to wait for her discharge paperwork with the plan in writing and the appropriate contact numbers.  Ultimately, she left the emergency room before discharge paperwork could be provided.  She is currently carrying a cell phone which is her preferred contact method.  The contact numbers she needs are as follows:  [facility]: [phone] Family medicine clinic: [phone]  Issues for Follow Up:  1. Assess volume status and need for dialysis based on fluid overload 2. Obtain BMP to assess for electrolyte derangements 3. Obtain  CBC to ensure stable hemoglobin from her recent GI bleed.  (See previous hospital discharge)  Significant Procedures: none  Significant Labs and Imaging:  Recent Labs  Lab 01/16/21 0500 01/16/21 1215 01/18/21 0810   WBC 9.8 10.5 9.2  HGB 7.2* 7.5* 8.5*  HCT 23.3* 22.9* 28.0*  PLT 179 189 215   Recent Labs  Lab 01/15/21 1318 01/16/21 0500 01/16/21 1215 01/18/21 0810  NA 143 140 139 140  K 4.9 4.6 4.7 4.4  CL 113* 111 111 107  CO2 18* 15* 15* 21*  GLUCOSE 92 88 116* 96  BUN 78* 75* 74* 55*  CREATININE 12.01* 11.60* 11.87* 10.72*  CALCIUM 7.1* 7.3* 7.9* 8.2*  MG  --  2.4  --   --   PHOS  --  7.6* 7.4*  --   ALKPHOS 62 57  --   --   AST 23 18  --   --   ALT 37 32  --   --   ALBUMIN 2.8* 2.5* 2.8*  --       Results/Tests Pending at Time of Discharge: none  Discharge Medications:  Allergies as of 01/18/2021      Reactions   Prozac [fluoxetine Hcl] Other (See Comments)   Panic attack    Wellbutrin [bupropion] Other (See Comments)   Panic attack   Prednisone Other (See Comments)   Pt states that this med caused pancreatitis.       Medication List    TAKE these medications   ARIPiprazole 5 MG tablet Commonly known as: ABILIFY Take 1 tablet (5 mg total) by mouth daily.   Blood Pressure Monitor 7 Devi 1 Units by Does not apply route daily. Measure blood pressure daily   calcitRIOL 0.25 MCG capsule Commonly known as: ROCALTROL Take 0.25 mcg by mouth every Monday, Wednesday, and Friday.   gabapentin 100 MG capsule Commonly known as: Neurontin Take 1 capsule (100 mg total) by mouth 2 (two) times daily.   HYDROcodone-acetaminophen 5-325 MG tablet Commonly known as: NORCO/VICODIN Take 1 tablet by mouth every 4 (four) hours as needed for moderate pain.   losartan 25 MG tablet Commonly known as: COZAAR Take 25 mg by mouth daily.   naphazoline-glycerin 0.012-0.2 % Soln Commonly known as: CLEAR EYES REDNESS 1-2 drops 4 (four) times daily as needed for eye irritation.   ondansetron 4 MG tablet Commonly known as: Zofran Take 1 tablet (4 mg total) by mouth every 8 (eight) hours as needed for nausea or vomiting.   sodium bicarbonate 650 MG tablet Take 1,300 mg by mouth  daily.       Discharge Instructions: Please refer to Patient Instructions section of EMR for full details.  Patient was counseled important signs and symptoms that should prompt return to medical care, changes in medications, dietary instructions, activity restrictions, and follow up appointments.   Follow-Up Appointments:  01/21/2021 - [facility], family medicine clinic   Matilde Haymaker, MD 01/18/2021, 11:34 AM PGY-3, [place]

## 2021-01-18 NOTE — ED Notes (Signed)
Attempted to take pt BP. Pt states it is to tight and ripped off bp cuff.

## 2021-01-18 NOTE — ED Notes (Signed)
Pt refuses discharge vital signs.
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
## 2021-01-20 ENCOUNTER — Ambulatory Visit (INDEPENDENT_AMBULATORY_CARE_PROVIDER_SITE_OTHER)
Admission: RE | Admit: 2021-01-20 | Discharge: 2021-01-20 | Disposition: A | Discharge status: Home / Self Care | Payer: Medicaid Other | Source: Ambulatory Visit | Attending: Surgery | Admitting: Surgery

## 2021-01-20 ENCOUNTER — Telehealth: Payer: Self-pay | Admitting: *Deleted

## 2021-01-20 ENCOUNTER — Telehealth: Payer: Self-pay

## 2021-01-20 ENCOUNTER — Ambulatory Visit (INDEPENDENT_AMBULATORY_CARE_PROVIDER_SITE_OTHER): Payer: Medicaid Other | Admitting: Physician Assistant

## 2021-01-20 ENCOUNTER — Other Ambulatory Visit: Payer: Self-pay

## 2021-01-20 VITALS — Ht 68.0 in | Wt 290.0 lb

## 2021-01-20 DIAGNOSIS — N185 Chronic kidney disease, stage 5: Secondary | ICD-10-CM | POA: Diagnosis not present

## 2021-01-20 DIAGNOSIS — N186 End stage renal disease: Secondary | ICD-10-CM

## 2021-01-20 DIAGNOSIS — Z992 Dependence on renal dialysis: Secondary | ICD-10-CM | POA: Diagnosis not present

## 2021-01-20 DIAGNOSIS — N184 Chronic kidney disease, stage 4 (severe): Secondary | ICD-10-CM

## 2021-01-20 NOTE — Telephone Encounter (Signed)
Transition Care Management Follow-up Telephone Call  Date of discharge and from where: 01/18/2021 - [facility]  How have you been since you were released from the hospital? "Still having some lightheadedness"  Any questions or concerns? No  Items Reviewed:  Did the pt receive and understand the discharge instructions provided? Yes   Medications obtained and verified? Yes   Other? N/A  Any new allergies since your discharge? No   Dietary orders reviewed? Yes  Do you have support at home? Yes   Home Care and Equipment/Supplies: Were home health services ordered? not applicable If so, what is the name of the agency? N/A  Has the agency set up a time to come to the patient's home? not applicable Were any new equipment or medical supplies ordered?  No What is the name of the medical supply agency? N/A Were you able to get the supplies/equipment? not applicable Do you have any questions related to the use of the equipment or supplies? No  Functional Questionnaire: (I = Independent and D = Dependent) ADLs: I  Bathing/Dressing- I  Meal Prep- I  Eating- I  Maintaining continence- I  Transferring/Ambulation- I  Managing Meds- I  Follow up appointments reviewed:   PCP Hospital f/u appt confirmed? No    Specialist Hospital f/u appt confirmed? Yes  Scheduled to see [facility] on 01/20/2021 @ 1500.  Are transportation arrangements needed? No   If their condition worsens, is the pt aware to call PCP or go to the Emergency Dept.? Yes  Was the patient provided with contact information for the PCP's office or ED? Yes  Was to pt encouraged to call back with questions or concerns? Yes

## 2021-01-20 NOTE — Progress Notes (Deleted)
    SUBJECTIVE:   CHIEF COMPLAINT / HPI: Hospital follow-up  Normocytic anemia Patient noted to have hemoglobin of 7.5 in the hospital with positive fecal occult blood test.  She was evaluated by gastroenterology and started on Anusol with no further inpatient intervention.  Since discharge, patient reports***  Recent active suicidal ideation Patient was admitted to hospital with IVC for active suicidal ideation. Today she states*** Prior to discharge, patient was started on Abilify, she reports taking this regularly***  ESRD with hypertension Patient started on losartan 25 mg for hypertension.  Patient received first session of hemodialysis on 2/17 while hospitalized.  She follows with nephrology as an outpatient.  Patient states that she has been set up with HD***    PERTINENT  PMH / PSH: ESRD  Normocytic anemia Depression Hypertension  OBJECTIVE:   LMP 01/13/2021   General: *** appearing stated age in no acute distress HEENT: MMM, no oral lesions noted,Neck non-tender without lymphadenopathy Cardio: Normal S1 and S2, no S3 or S4. Rhythm is regular. No murmurs or rubs.  Bilateral radial pulses palpable Pulm: Clear to auscultation bilaterally, no crackles, wheezing, or diminished breath sounds. Normal respiratory effort Abdomen: Bowel sounds normal. Abdomen soft and non-tender.  Extremities: No peripheral edema. Warm & well perfused.  Neuro: pt alert and oriented x4, follows commands, PERRLA, EOMI bilaterally   ASSESSMENT/PLAN:   No problem-specific Assessment & Plan notes found for this encounter.     Eulis Foster, MD [place]

## 2021-01-20 NOTE — H&P (View-Only) (Signed)
POST OPERATIVE OFFICE NOTE    CC:  F/u for surgery  HPI:  This is a 25 y.o. female who is s/p left BC AVF on 10/18/2020 by Dr. Trula Slade.    Pt states she does have some numbness around the left thumb but this was not present until she was restrained in handcuffs earlier in the week last week.  She denies any pain in her left hand.  She states that she did have one dialysis treatment at the hospital last week.  Her fistula was used but started to clot off at the end of her treatment.  She states she has a bruise where they stuck her.  She states that she does not have a center yet but they are working on this.  Her nephrologist is Dr. Johnney Ou.  She is here with her boyfriend.     She states that she had some blood-work drawn from the right arm (antecubital space) and now has a knot that is painful.    She is adamant about not having a catheter.    Allergies  Allergen Reactions  . Prozac [Fluoxetine Hcl] Other (See Comments)    Panic attack   . Wellbutrin [Bupropion] Other (See Comments)    Panic attack  . Prednisone Other (See Comments)    Pt states that this med caused pancreatitis.     Current Outpatient Medications  Medication Sig Dispense Refill  . ARIPiprazole (ABILIFY) 5 MG tablet Take 1 tablet (5 mg total) by mouth daily. 30 tablet 0  . Blood Pressure Monitoring (BLOOD PRESSURE MONITOR 7) DEVI 1 Units by Does not apply route daily. Measure blood pressure daily 1 each 0  . calcitRIOL (ROCALTROL) 0.25 MCG capsule Take 0.25 mcg by mouth every Monday, Wednesday, and Friday.     . gabapentin (NEURONTIN) 100 MG capsule Take 1 capsule (100 mg total) by mouth 2 (two) times daily. 60 capsule 0  . losartan (COZAAR) 25 MG tablet Take 25 mg by mouth daily.    . naphazoline-glycerin (CLEAR EYES REDNESS) 0.012-0.2 % SOLN 1-2 drops 4 (four) times daily as needed for eye irritation.    . ondansetron (ZOFRAN) 4 MG tablet Take 1 tablet (4 mg total) by mouth every 8 (eight) hours as needed for  nausea or vomiting. 30 tablet 0  . sodium bicarbonate 650 MG tablet Take 1,300 mg by mouth daily.     No current facility-administered medications for this visit.     ROS:  See HPI  Physical Exam:  Today's Vitals   01/20/21 1502  Weight: 290 lb (131.5 kg)  Height: '5\' 8"'$  (1.727 m)   Body mass index is 44.09 kg/m.   General:  WDWN in NAD; vital signs documented above Gait: Normal HENT: WNL, normocephalic Pulmonary: normal non-labored breathing Cardiac: regular HR Abdomen: obese Skin: with small area of ecchymosis over fistula Vascular Exam/Pulses: Extremities:  There is a palpable left radial pulse.   Motor and sensory are in tact.   There is a thrill/bruit present.  The fistula is easily palpable but becomes more difficult to feel going proximal due to depth.  Right antecubital space with medial hematoma.  No redness present. Incision:  Healed.  Musculoskeletal: no muscle wasting or atrophy  Neurologic: A&O X 3;  No focal weakness or paresthesias are detected Psychiatric:  The pt has Normal affect.  Dialysis Duplex on 01/20/2021: +------------+----------+-------------+----------+----------------+  OUTFLOW VEINPSV (cm/s)Diameter (cm)Depth (cm)  Describe    +------------+----------+-------------+----------+----------------+  Shoulder    101  1.06     1.48            +------------+----------+-------------+----------+----------------+  Prox UA     180    1.04     1.06            +------------+----------+-------------+----------+----------------+  Mid UA     259    1.11     0.43  competing branch  +------------+----------+-------------+----------+----------------+  Dist UA     829    1.10     0.69              Assessment/Plan:  This is a 25 y.o. female who is s/p: Left BC AVF 10/18/2020 by Dr. Trula Slade  -the pt does not have evidence of steal. -the fistula has  matured nicely, however, it is greater than 1cm deep proximally.  I have discussed with pt and her boyfriend that the fistula needs to be superficialized and that she would need a catheter while this is healing.  She is very adamant about not having a catheter.  I explained to her and her boyfriend that these accesses do not last forever and will need more work and maintenance to help keep them going and she will eventually need new access.  She did not want to schedule this today and wants a second opinion.  I discussed with her that if she would like, I could schedule an appointment with Dr. Trula Slade to discuss this further but she did not want to do that.  If she continues with dialysis and they are not able to access the fistula, she will contact us.  -her right arm antecubital space appears to have a hematoma from a blood draw.  She does not have any cellulitis/erythema present.  Discussed elevating her arm and using warm compresses.    Leontine Locket, [facility] [phone]  Clinic MD:  Oneida Alar on call MD

## 2021-01-20 NOTE — Telephone Encounter (Signed)
Transition Care Management Follow-up Telephone Call  Date of discharge and from where: 01/17/2021, [facility]   How have you been since you were released from the hospital? She said she is feeling all right, much better than when she went into the hospital  Any questions or concerns? No  Items Reviewed:  Did the pt receive and understand the discharge instructions provided? Yes   Medications obtained and verified? Yes  - she said she has all medications including the abilify and gabapentin and she didn't have any questions about her med regime.   Other? No   Any new allergies since your discharge? No   Do you have support at home? she is currently homeless, staying at [facility] and Equipment/Supplies: Were home health services ordered? no If so, what is the name of the agency? n/a  Has the agency set up a time to come to the patient's home? not applicable Were any new equipment or medical supplies ordered?  No What is the name of the medical supply agency? n/a Were you able to get the supplies/equipment? not applicable Do you have any questions related to the use of the equipment or supplies? No  Functional Questionnaire: (I = Independent and D = Dependent) ADLs: independent  Follow up appointments reviewed:   PCP Hospital f/u appt confirmed? Yes  Scheduled to see Dr Joya Gaskins 01/23/2021 however she also has an appointment with Family Medicine 01/21/2021. She said that the appointment at [facility] was scheduled by the hospital.  She plans to go to that appointment but still wants to see Dr Joya Gaskins 01/23/2021 and would prefer to keep Dr Joya Gaskins as PCP.    [facility] f/u appt confirmed? Yes  - [facility] today 01/20/2021   Are transportation arrangements needed? No   If their condition worsens, is the pt aware to call PCP or go to the Emergency Dept.? Yes  Was the patient provided with contact information for the PCP's office or ED? Yes  Was to pt  encouraged to call back with questions or concerns? Yes

## 2021-01-20 NOTE — Progress Notes (Signed)
POST OPERATIVE OFFICE NOTE    CC:  F/u for surgery  HPI:  This is a 25 y.o. female who is s/p left BC AVF on 10/18/2020 by Dr. Trula Slade.    Pt states she does have some numbness around the left thumb but this was not present until she was restrained in handcuffs earlier in the week last week.  She denies any pain in her left hand.  She states that she did have one dialysis treatment at the hospital last week.  Her fistula was used but started to clot off at the end of her treatment.  She states she has a bruise where they stuck her.  She states that she does not have a center yet but they are working on this.  Her nephrologist is Dr. Johnney Ou.  She is here with her boyfriend.     She states that she had some blood-work drawn from the right arm (antecubital space) and now has a knot that is painful.    She is adamant about not having a catheter.    Allergies  Allergen Reactions  . Prozac [Fluoxetine Hcl] Other (See Comments)    Panic attack   . Wellbutrin [Bupropion] Other (See Comments)    Panic attack  . Prednisone Other (See Comments)    Pt states that this med caused pancreatitis.     Current Outpatient Medications  Medication Sig Dispense Refill  . ARIPiprazole (ABILIFY) 5 MG tablet Take 1 tablet (5 mg total) by mouth daily. 30 tablet 0  . Blood Pressure Monitoring (BLOOD PRESSURE MONITOR 7) DEVI 1 Units by Does not apply route daily. Measure blood pressure daily 1 each 0  . calcitRIOL (ROCALTROL) 0.25 MCG capsule Take 0.25 mcg by mouth every Monday, Wednesday, and Friday.     . gabapentin (NEURONTIN) 100 MG capsule Take 1 capsule (100 mg total) by mouth 2 (two) times daily. 60 capsule 0  . losartan (COZAAR) 25 MG tablet Take 25 mg by mouth daily.    . naphazoline-glycerin (CLEAR EYES REDNESS) 0.012-0.2 % SOLN 1-2 drops 4 (four) times daily as needed for eye irritation.    . ondansetron (ZOFRAN) 4 MG tablet Take 1 tablet (4 mg total) by mouth every 8 (eight) hours as needed for  nausea or vomiting. 30 tablet 0  . sodium bicarbonate 650 MG tablet Take 1,300 mg by mouth daily.     No current facility-administered medications for this visit.     ROS:  See HPI  Physical Exam:  Today's Vitals   01/20/21 1502  Weight: 290 lb (131.5 kg)  Height: '5\' 8"'$  (1.727 m)   Body mass index is 44.09 kg/m.   General:  WDWN in NAD; vital signs documented above Gait: Normal HENT: WNL, normocephalic Pulmonary: normal non-labored breathing Cardiac: regular HR Abdomen: obese Skin: with small area of ecchymosis over fistula Vascular Exam/Pulses: Extremities:  There is a palpable left radial pulse.   Motor and sensory are in tact.   There is a thrill/bruit present.  The fistula is easily palpable but becomes more difficult to feel going proximal due to depth.  Right antecubital space with medial hematoma.  No redness present. Incision:  Healed.  Musculoskeletal: no muscle wasting or atrophy  Neurologic: A&O X 3;  No focal weakness or paresthesias are detected Psychiatric:  The pt has Normal affect.  Dialysis Duplex on 01/20/2021: +------------+----------+-------------+----------+----------------+  OUTFLOW VEINPSV (cm/s)Diameter (cm)Depth (cm)  Describe    +------------+----------+-------------+----------+----------------+  Shoulder    101  1.06     1.48            +------------+----------+-------------+----------+----------------+  Prox UA     180    1.04     1.06            +------------+----------+-------------+----------+----------------+  Mid UA     259    1.11     0.43  competing branch  +------------+----------+-------------+----------+----------------+  Dist UA     829    1.10     0.69              Assessment/Plan:  This is a 25 y.o. female who is s/p: Left BC AVF 10/18/2020 by Dr. Trula Slade  -the pt does not have evidence of steal. -the fistula has  matured nicely, however, it is greater than 1cm deep proximally.  I have discussed with pt and her boyfriend that the fistula needs to be superficialized and that she would need a catheter while this is healing.  She is very adamant about not having a catheter.  I explained to her and her boyfriend that these accesses do not last forever and will need more work and maintenance to help keep them going and she will eventually need new access.  She did not want to schedule this today and wants a second opinion.  I discussed with her that if she would like, I could schedule an appointment with Dr. Trula Slade to discuss this further but she did not want to do that.  If she continues with dialysis and they are not able to access the fistula, she will contact us.  -her right arm antecubital space appears to have a hematoma from a blood draw.  She does not have any cellulitis/erythema present.  Discussed elevating her arm and using warm compresses.    Leontine Locket, [facility] [phone]  Clinic MD:  Oneida Alar on call MD

## 2021-01-21 ENCOUNTER — Ambulatory Visit: Payer: Medicaid Other

## 2021-01-22 ENCOUNTER — Telehealth: Payer: Self-pay

## 2021-01-22 NOTE — Telephone Encounter (Signed)
Renal Navigator received confirmation that patient has been accepted for outpatient HD treatment at [facility] on a MWF schedule with a seat time of 7:20am. She needs to arrive to her appointments at 7:00am. Renal Navigator contacted patient at phone number listed in Epic/[phone] to notify of above. She was very appreciative, though asked if she could have an HD treatment today. Navigator explained that her first appointment will be Friday, 01/25/20 and that she needs to arrive to the clinic at 7:00am for her 7:20am seat time. Navigator states that she needs to call the clinic at [phone] now to arrange a time to go there tomorrow to sign consent papers prior to starting on Friday. She stated understanding. Renal Navigator asked her how she is feeling and she replied, "a little light-headed." Navigator asked her to go to the ER if she is feeling like she needs to be assessed, in need of HD before Friday, SOB, etc, adding that she will not be dialyzed unless her exam and lab work show her to be in need of urgent HD. She stated understanding and states she does not think she is any type of critical situation. She states she is scheduled for lab work tomorrow. Navigator again asked her to call her clinic to arrange consent signing and encouraged her to go to the ER in the event of an emergency. She agreed.  Navigator asked patient about transportation and she states she has a car and drives herself. Navigator suggests she inquire about Medicaid transportation if she need transportation at any time, since she has this available to her through Medicaid benefits. She agreed and states she has the information.  Patient thanked Navigator and states understanding to information given. She was very Patent attorney.  Navigator updated clinic and asked Renal PA in the hospital today to send orders to [facility] for patient to start on Friday.  Alphonzo Cruise, [place] Renal Navigator [phone]

## 2021-01-23 ENCOUNTER — Other Ambulatory Visit: Payer: Self-pay

## 2021-01-23 ENCOUNTER — Ambulatory Visit: Payer: Medicaid Other | Admitting: Physician Assistant

## 2021-01-23 DIAGNOSIS — T7840XA Allergy, unspecified, initial encounter: Secondary | ICD-10-CM | POA: Insufficient documentation

## 2021-01-23 DIAGNOSIS — D631 Anemia in chronic kidney disease: Secondary | ICD-10-CM | POA: Insufficient documentation

## 2021-01-23 DIAGNOSIS — F339 Major depressive disorder, recurrent, unspecified: Secondary | ICD-10-CM | POA: Insufficient documentation

## 2021-01-23 DIAGNOSIS — N2581 Secondary hyperparathyroidism of renal origin: Secondary | ICD-10-CM | POA: Insufficient documentation

## 2021-01-23 DIAGNOSIS — D688 Other specified coagulation defects: Secondary | ICD-10-CM | POA: Insufficient documentation

## 2021-01-23 DIAGNOSIS — D509 Iron deficiency anemia, unspecified: Secondary | ICD-10-CM | POA: Insufficient documentation

## 2021-01-23 DIAGNOSIS — N186 End stage renal disease: Secondary | ICD-10-CM | POA: Insufficient documentation

## 2021-01-23 DIAGNOSIS — R0602 Shortness of breath: Secondary | ICD-10-CM | POA: Insufficient documentation

## 2021-01-23 NOTE — Progress Notes (Signed)
  This encounter was created in error - please disregard.  Patient did not want to be seen, stated she would call to reschedule

## 2021-01-27 DIAGNOSIS — E440 Moderate protein-calorie malnutrition: Secondary | ICD-10-CM | POA: Insufficient documentation

## 2021-01-31 ENCOUNTER — Other Ambulatory Visit: Payer: Self-pay

## 2021-02-04 ENCOUNTER — Other Ambulatory Visit (HOSPITAL_COMMUNITY): Payer: Medicaid Other

## 2021-02-05 ENCOUNTER — Encounter (HOSPITAL_COMMUNITY): Payer: Self-pay | Admitting: Surgery

## 2021-02-05 ENCOUNTER — Encounter (HOSPITAL_COMMUNITY): Payer: Self-pay | Admitting: Vascular Surgery

## 2021-02-05 MED ORDER — DEXTROSE 5 % IV SOLN
3.0000 g | INTRAVENOUS | Status: DC
  Filled 2021-02-05: qty 3000

## 2021-02-05 NOTE — Anesthesia Preprocedure Evaluation (Deleted)
Anesthesia Evaluation    Reviewed: Allergy & Precautions, Patient's Chart, lab work & pertinent test results  Airway        Dental   Pulmonary Current Smoker,           Cardiovascular hypertension, Pt. on medications      Neuro/Psych PSYCHIATRIC DISORDERS Anxiety Depression Bipolar Disorder PTSD (post-traumatic stress disorder) Neuromuscular disease    GI/Hepatic negative GI ROS, Neg liver ROS,   Endo/Other  Morbid obesity  Renal/GU ESRFRenal disease     Musculoskeletal negative musculoskeletal ROS (+)   Abdominal (+) + obese,   Peds  Hematology negative hematology ROS (+)   Anesthesia Other Findings ESRD  Reproductive/Obstetrics                            Anesthesia Physical Anesthesia Plan  ASA:   Anesthesia Plan:    Post-op Pain Management:    Induction:   PONV Risk Score and Plan:   Airway Management Planned:   Additional Equipment:   Intra-op Plan:   Post-operative Plan:   Informed Consent:   Plan Discussed with:   Anesthesia Plan Comments: (PAT note written 02/05/2021 by Myra Gianotti, PA-C. SAME DAY WORK-UP   )        Anesthesia Quick Evaluation
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
## 2021-02-05 NOTE — Progress Notes (Signed)
PCP - Dr Asencion Noble Cardiologist - n/a  Chest x-ray - n/a EKG - 01/16/21 Stress Test - n/a ECHO - n/a Cardiac Cath - n/a  Anesthesia review: Yes  STOP now taking any Aspirin (unless otherwise instructed by your surgeon), Aleve, Naproxen, Ibuprofen, Motrin, Advil, Goody's, BC's, all herbal medications, fish oil, and all vitamins.   Coronavirus Screening Covid test on DOS 02/06/21 Do you have any of the following symptoms:  Cough yes/no: No Fever (>100.93F)  yes/no: No Runny nose yes/no: No Sore throat yes/no: No Difficulty breathing/shortness of breath  yes/no: No  Have you traveled in the last 14 days and where? yes/no: No  Patient verbalized understanding of instructions that were given via

## 2021-02-06 ENCOUNTER — Ambulatory Visit (HOSPITAL_COMMUNITY): Admission: RE | Admit: 2021-02-06 | Payer: Medicaid Other | Source: Home / Self Care | Admitting: Surgery

## 2021-02-06 ENCOUNTER — Emergency Department (HOSPITAL_COMMUNITY)
Admission: EM | Admit: 2021-02-06 | Discharge: 2021-02-06 | Discharge status: Against Medical Advice | Payer: Medicaid Other | Attending: Emergency Medicine | Admitting: Emergency Medicine

## 2021-02-06 ENCOUNTER — Encounter (HOSPITAL_COMMUNITY): Payer: Self-pay | Admitting: *Deleted

## 2021-02-06 ENCOUNTER — Emergency Department (HOSPITAL_COMMUNITY)
Admission: EM | Admit: 2021-02-06 | Discharge: 2021-02-06 | Disposition: A | Discharge status: Home / Self Care | Payer: Medicaid Other | Source: Home / Self Care

## 2021-02-06 ENCOUNTER — Other Ambulatory Visit: Payer: Self-pay

## 2021-02-06 ENCOUNTER — Encounter (HOSPITAL_COMMUNITY)
Admission: EM | Discharge status: Against Medical Advice | Payer: Self-pay | Source: Home / Self Care | Attending: Emergency Medicine

## 2021-02-06 DIAGNOSIS — R42 Dizziness and giddiness: Secondary | ICD-10-CM | POA: Insufficient documentation

## 2021-02-06 DIAGNOSIS — Z992 Dependence on renal dialysis: Secondary | ICD-10-CM

## 2021-02-06 DIAGNOSIS — Z79899 Other long term (current) drug therapy: Secondary | ICD-10-CM | POA: Insufficient documentation

## 2021-02-06 DIAGNOSIS — N186 End stage renal disease: Secondary | ICD-10-CM | POA: Diagnosis not present

## 2021-02-06 DIAGNOSIS — J45909 Unspecified asthma, uncomplicated: Secondary | ICD-10-CM | POA: Diagnosis not present

## 2021-02-06 DIAGNOSIS — Z5321 Procedure and treatment not carried out due to patient leaving prior to being seen by health care provider: Secondary | ICD-10-CM | POA: Insufficient documentation

## 2021-02-06 DIAGNOSIS — R112 Nausea with vomiting, unspecified: Secondary | ICD-10-CM | POA: Insufficient documentation

## 2021-02-06 DIAGNOSIS — F17210 Nicotine dependence, cigarettes, uncomplicated: Secondary | ICD-10-CM | POA: Insufficient documentation

## 2021-02-06 DIAGNOSIS — I120 Hypertensive chronic kidney disease with stage 5 chronic kidney disease or end stage renal disease: Secondary | ICD-10-CM | POA: Insufficient documentation

## 2021-02-06 DIAGNOSIS — R110 Nausea: Secondary | ICD-10-CM

## 2021-02-06 HISTORY — DX: Myoneural disorder, unspecified: G70.9

## 2021-02-06 LAB — CBC WITH DIFFERENTIAL/PLATELET
Abs Immature Granulocytes: 0.03 10*3/uL (ref 0.00–0.07)
Basophils Absolute: 0 10*3/uL (ref 0.0–0.1)
Basophils Relative: 0 %
Eosinophils Absolute: 0.2 10*3/uL (ref 0.0–0.5)
Eosinophils Relative: 2 %
HCT: 23.5 % — ABNORMAL LOW (ref 36.0–46.0)
Hemoglobin: 7.4 g/dL — ABNORMAL LOW (ref 12.0–15.0)
Immature Granulocytes: 0 %
Lymphocytes Relative: 28 %
Lymphs Abs: 2.4 10*3/uL (ref 0.7–4.0)
MCH: 31.1 pg (ref 26.0–34.0)
MCHC: 31.5 g/dL (ref 30.0–36.0)
MCV: 98.7 fL (ref 80.0–100.0)
Monocytes Absolute: 0.6 10*3/uL (ref 0.1–1.0)
Monocytes Relative: 7 %
Neutro Abs: 5.2 10*3/uL (ref 1.7–7.7)
Neutrophils Relative %: 63 %
Platelets: 229 10*3/uL (ref 150–400)
RBC: 2.38 MIL/uL — ABNORMAL LOW (ref 3.87–5.11)
RDW: 13.3 % (ref 11.5–15.5)
WBC: 8.4 10*3/uL (ref 4.0–10.5)
nRBC: 0 % (ref 0.0–0.2)

## 2021-02-06 LAB — I-STAT CHEM 8, ED
BUN: 79 mg/dL — ABNORMAL HIGH (ref 6–20)
Calcium, Ion: 1.1 mmol/L — ABNORMAL LOW (ref 1.15–1.40)
Chloride: 111 mmol/L (ref 98–111)
Creatinine, Ser: 14.4 mg/dL — ABNORMAL HIGH (ref 0.44–1.00)
Glucose, Bld: 82 mg/dL (ref 70–99)
HCT: 23 % — ABNORMAL LOW (ref 36.0–46.0)
Hemoglobin: 7.8 g/dL — ABNORMAL LOW (ref 12.0–15.0)
Potassium: 5.2 mmol/L — ABNORMAL HIGH (ref 3.5–5.1)
Sodium: 140 mmol/L (ref 135–145)
TCO2: 18 mmol/L — ABNORMAL LOW (ref 22–32)

## 2021-02-06 LAB — COMPREHENSIVE METABOLIC PANEL
ALT: 18 U/L (ref 0–44)
AST: 13 U/L — ABNORMAL LOW (ref 15–41)
Albumin: 2.9 g/dL — ABNORMAL LOW (ref 3.5–5.0)
Alkaline Phosphatase: 63 U/L (ref 38–126)
Anion gap: 12 (ref 5–15)
BUN: 64 mg/dL — ABNORMAL HIGH (ref 6–20)
CO2: 17 mmol/L — ABNORMAL LOW (ref 22–32)
Calcium: 8.1 mg/dL — ABNORMAL LOW (ref 8.9–10.3)
Chloride: 109 mmol/L (ref 98–111)
Creatinine, Ser: 13.85 mg/dL — ABNORMAL HIGH (ref 0.44–1.00)
GFR, Estimated: 3 mL/min — ABNORMAL LOW (ref >60)
Glucose, Bld: 91 mg/dL (ref 70–99)
Potassium: 5.1 mmol/L (ref 3.5–5.1)
Sodium: 138 mmol/L (ref 135–145)
Total Bilirubin: 0.4 mg/dL (ref 0.3–1.2)
Total Protein: 6.5 g/dL (ref 6.5–8.1)

## 2021-02-06 LAB — LIPASE, BLOOD: Lipase: 58 U/L — ABNORMAL HIGH (ref 11–51)

## 2021-02-06 LAB — I-STAT BETA HCG BLOOD, ED (MC, WL, AP ONLY): I-stat hCG, quantitative: <5 m[IU]/mL (ref <5)

## 2021-02-06 SURGERY — FISTULA SUPERFICIALIZATION
Anesthesia: Choice

## 2021-02-06 MED ORDER — LOKELMA 10 G PO PACK: 10.0000 g | PACK | Freq: Every day | ORAL | 0 refills | Status: AC

## 2021-02-06 MED ORDER — ONDANSETRON HCL 4 MG/2ML IJ SOLN
4.0000 mg | Freq: Once | INTRAMUSCULAR | Status: AC
  Administered 2021-02-06: 4 mg via INTRAVENOUS
  Filled 2021-02-06: qty 2

## 2021-02-06 NOTE — ED Notes (Signed)
Pheb. Attempted to draw lab  x2 unsuccessful

## 2021-02-06 NOTE — Progress Notes (Signed)
Contacted Dr. Trula Slade and explained to Dr. Trula Slade the patient's concerns about not having dialysis in a week and needing procedure.  Dr. Trula Slade stated to have patient come to hospital and we will try to get her scheduled with another vascular surgeon today but unsure of time.   Contacted patient and explained to patient to come to the hospital, we would do her covid test and we would do our best to get her scheduled with a different vascular surgeon at some point today but was not sure what time it would be.  Patient told nurse that she was not coming to the hospital to sit and wait all day for her surgery and that she had been working with Dr. Trula Slade and did not want her procedure to be done by some other surgeon today.     Nurse apologized to patient but explained we were doing the best we could to get her scheduled for today so she could have her procedure.   Patient was also informed that she would need a covid test on arrival and that would take at least an hour to result.  Patient was scheduled for a covid test on 02/04/21 at 1055 but was a no show.  Patient was also called to reschedule covid test on 02/05/21 at 1132 but did not answer and did not return phone call.  Patient then stated "well last time my surgery was at 0730 and I needed a covid test and didn't show up until later but you still did me."   It was explained to patient that Dr. Trula Slade had an emergency and due to OR schedule and her needing a covid test that we would do our best to get her to the OR today but was just unsure of time.  Patient then again told nurse that she was not coming and that she was just going to die because we didn't care.   Nurse encouraged patient to come to hospital but she refused.   Nurse instructed patient to go to emergency department if needed.  Patient then hung up on nurse.

## 2021-02-06 NOTE — Progress Notes (Signed)
Patient has not arrived at schedule time of 0530.  Secretary reached out to patient at [phone] and patient stated that was on [facility].     Dr. Trula Slade made aware that patient has not arrived and due to an emergent add-on, patient will need to be reschedule.  Patient notified and instructed patient to call office to reschedule.    OR desk aware.

## 2021-02-06 NOTE — ED Provider Notes (Signed)
[facility] EMERGENCY DEPARTMENT Provider Note   CSN: [id number] Arrival date & time: 02/06/21  0740     History Chief Complaint  Patient presents with  . Nausea  . Emesis    Natasha Long is a 25 y.o. female.  HPI    25yo female with history of nephrotic syndrome seeing Dr. Johnney Ou, childhood asthma, childhood htn, PTSD, MDD, fistula placement left arm 09/2020, ESRD on dialysis beginning last month with discussion as outpatient with vascular surgery regarding the fistula needing superficialization and that she would need a catheter while it was healing and she declined initially however presents today with nausea, vomiting and feeling "foggy headed" and reports she is interested in the procedure.  Reports she has had nausea and vomiting beginning yesterday.  Reports the nausea is constant.  Had one episode of vomiting yesterday.  Reports some mild abdominal pain that feels more like nausea and that she needs to vomit.  Denies diarrhea, constipation, fever, dysuria.  She just completed her menses days ago.  Denies shortness of breath.  Reports that she just feels like she is in a brain fog.  She has not been to dialysis in 1 and half weeks which she reports is due to problems with the fistula.  Reports that whenever they try to use the fistula it is clotting, and that they have to "digging around for it a lot" because the fistula is too deep and is painful and they do this.  Reports she initially did not want to have the catheter placed, but now has come to terms with it understanding that she needs the catheter in order to complete dialysis.     Past Medical History:  Diagnosis Date  . Asthma    as a child, no problem as an adult, no inhaler  . Bipolar affective, manic ([place])    denies -- it was determined to be border line personsity disorder  . Complication of anesthesia    woke up before tube removed, 1 time fought nurses  . Heart murmur    as a infant does  not have now  . History of borderline personality disorder   . Hypertension    diagnosed as child; stopped meds at 74 yo  . Hypoglycemia   . Insomnia   . Nephrotic syndrome    sees [facility]  . Nephrotic syndrome   . Neuromuscular disorder ([place])    peripheral neuropathy  . PTSD (post-traumatic stress disorder)   . PTSD (post-traumatic stress disorder)   . Renal disease     Patient Active Problem List   Diagnosis Date Noted  . Anemia   . Suicidal ideations   . GI bleed 01/15/2021  . Influenza vaccine refused 12/19/2020  . Morbid obesity ([place]) 01/30/2020  . Abscess of skin 01/30/2020  . 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 Chronic kidney disease 01/30/2020  . Cocaine use, 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[place]) 01/30/2020  . Affective psychosis, bipolar ([place]) 06/27/2019  . Borderline personality disorder ([place]) 11/16/2018  . Moderate cannabis use disorder ([place]) 11/16/2018  . 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 Chronic hypertension 04/04/2018  . Nephrotic syndrome 04/01/2018  . PTSD (post-traumatic stress disorder) 04/01/2018  . Bipolar disorder, current episode depressed, mild ([place]) 04/09/2017    Past Surgical History:  Procedure Laterality Date  . AV FISTULA PLACEMENT Left 10/18/2020   Procedure: LEFT ARM ARTERIOVENOUS (AV) FISTULA CREATION;  Surgeon: Serafina Mitchell, MD;  Location: [facility];  Service: Vascular;  Laterality: Left;  . CHOLECYSTECTOMY    . extraction of wisdom teeth    . RENAL BIOPSY       OB History    Gravida  2   Para  1   Term  1   Preterm      AB      Living  1     SAB      IAB      Ectopic      Multiple      Live Births  1           Family History  Adopted: Yes  Problem Relation Age of Onset  . Diabetes Other   . Hypertension Other     Social History   Tobacco Use  . Smoking status: Current Every Day Smoker    Packs/day: 0.25    Years: 8.00     Pack years: 2.00    Types: Cigarettes  . Smokeless tobacco: Never Used  Vaping Use  . Vaping Use: Never used  Substance Use Topics  . Alcohol use: Not Currently    Comment: "maybe 3 a month."- liquor  . Drug use: Not Currently    Types: Marijuana, Cocaine    Comment: 10/17/20- no cocaine, marijunia -last time 2-3 weeks ago.    Home Medications Prior to Admission medications   Medication Sig Start Date End Date Taking? Authorizing Provider  sodium zirconium cyclosilicate (LOKELMA) 10 g PACK packet Take 10 g by mouth daily for 2 days. 02/06/21 02/08/21 Yes Gareth Morgan, MD  Acetaminophen (TYLENOL PO) Take 650 mg by mouth daily as needed. 2 tabs tid prn    [provider]  ARIPiprazole (ABILIFY) 5 MG tablet Take 1 tablet (5 mg total) by mouth daily. 01/18/21   Matilde Haymaker, MD  Blood Pressure Monitoring (BLOOD PRESSURE MONITOR 7) DEVI 1 Units by Does not apply route daily. Measure blood pressure daily 02/29/20   [name], MD  calcitRIOL (ROCALTROL) 0.25 MCG capsule Take 0.25 mcg by mouth every Monday, Wednesday, and Friday.  09/25/20   [provider]  gabapentin (NEURONTIN) 100 MG capsule Take 1 capsule (100 mg total) by mouth 2 (two) times daily. 01/17/21 02/16/21  Matilde Haymaker, MD  losartan (COZAAR) 25 MG tablet Take 25 mg by mouth daily. 09/25/20   [provider]  naphazoline-glycerin (CLEAR EYES REDNESS) 0.012-0.2 % SOLN 1-2 drops 4 (four) times daily as needed for eye irritation.    [provider]  ondansetron (ZOFRAN) 4 MG tablet Take 1 tablet (4 mg total) by mouth every 8 (eight) hours as needed for nausea or vomiting. 01/30/20   [name], MD  sodium bicarbonate 650 MG tablet Take 1,300 mg by mouth daily. 07/12/20   [provider]    Allergies    Prozac [fluoxetine hcl], Wellbutrin [bupropion], and Prednisone  Review of Systems   Review of Systems  Constitutional: Negative for fever.  HENT: Negative for sore throat.    Respiratory: Negative for cough and shortness of breath.   Cardiovascular: Negative for chest pain.  Gastrointestinal: Positive for nausea and vomiting. Negative for abdominal pain, constipation and diarrhea.  Genitourinary: Negative for difficulty urinating.  Musculoskeletal: Negative for back pain and neck pain.  Skin: Negative for rash.  Neurological: Negative for syncope and headaches.    Physical Exam Updated Vital Signs BP 123/71   Pulse 63   Temp 98.2 F (36.8 C) (Oral)   Resp 17   Ht  $'5\' 8"'B$  (1.727 m)   Wt 127.9 kg   LMP 01/27/2021 (Approximate)   SpO2 100%   BMI 42.88 kg/m   Physical Exam Vitals and nursing note reviewed.  Constitutional:      General: She is not in acute distress.    Appearance: She is well-developed. She is not diaphoretic.  HENT:     Head: Normocephalic and atraumatic.  Eyes:     Conjunctiva/sclera: Conjunctivae normal.  Cardiovascular:     Rate and Rhythm: Normal rate and regular rhythm.  Pulmonary:     Effort: Pulmonary effort is normal. No respiratory distress.  Abdominal:     General: There is no distension.     Palpations: Abdomen is soft.     Tenderness: There is no abdominal tenderness. There is no guarding.  Musculoskeletal:        General: No tenderness.     Cervical back: Normal range of motion.     Comments: [place]ill left arm AV fistula  Skin:    General: Skin is warm and dry.     Findings: No erythema or rash.  Neurological:     Mental Status: She is alert and oriented to person, place, and time.     ED Results / Procedures / Treatments   Labs (all labs ordered are listed, but only abnormal results are displayed) Labs Reviewed  LIPASE, BLOOD - Abnormal; Notable for the following components:      Result Value   Lipase 58 (*)    All other components within normal limits  COMPREHENSIVE METABOLIC PANEL - Abnormal; Notable for the following components:   CO2 17 (*)    BUN 64 (*)    Creatinine, Ser 13.85 (*)    Calcium  8.1 (*)    Albumin 2.9 (*)    AST 13 (*)    GFR, Estimated 3 (*)    All other components within normal limits  CBC WITH DIFFERENTIAL/PLATELET - Abnormal; Notable for the following components:   RBC 2.38 (*)    Hemoglobin 7.4 (*)    HCT 23.5 (*)    All other components within normal limits  I-STAT CHEM 8, ED - Abnormal; Notable for the following components:   Potassium 5.2 (*)    BUN 79 (*)    Creatinine, Ser 14.40 (*)    Calcium, Ion 1.10 (*)    TCO2 18 (*)    Hemoglobin 7.8 (*)    HCT 23.0 (*)    All other components within normal limits  I-STAT BETA HCG BLOOD, ED ([facility], [facility], [facility] ONLY)    EKG EKG Interpretation  Date/Time:  Thursday February 06 2021 08:47:41 EST Ventricular Rate:  62 PR Interval:    QRS Duration: 91 QT Interval:  428 QTC Calculation: 435 R Axis:   37 Text Interpretation: Sinus rhythm Low voltage, precordial leads Similar appearance to prior Confirmed by Gareth Morgan [phone]) on 02/06/2021 9:02:56 AM   Radiology No results found.  Procedures Procedures   Medications Ordered in ED Medications  ondansetron (ZOFRAN) injection 4 mg (4 mg Intravenous Given 02/06/21 0900)    ED Course  I have reviewed the triage vital signs and the nursing notes.  Pertinent labs & imaging results that were available during my care of the patient were reviewed by me and considered in my medical decision making (see chart for details).    MDM Rules/Calculators/A&P  25yo female with history of nephrotic syndrome seeing Dr. Johnney Ou, childhood asthma, childhood htn, PTSD, MDD, fistula placement left arm 09/2020, ESRD on dialysis beginning last month with discussion as outpatient with vascular surgery regarding the fistula needing superficialization and that she would need a catheter while it was healing and she declined initially however presents today with nausea, vomiting and feeling "foggy headed" and reports she is interested in the procedure.  Labs  show very mild hyperkalemia, as well as elevated BUN and Cr. She is not in respiratory distress, no hypoxia or dyspnea.. Discussed that her labs are only slightly abnormal but I worry about her worsening at home without a plan in place for her to have catheter placed and dialysis. Attempted to page vascular surgery x2  She then decided to leave the hospital against medical advice, citing that the hospital was making her feel anxious. She denies SI and has the capacity to make this decision.  After she had left I did discuss with Dr. Jonnie Finner of Nephrology who will also see about possible catheter placement with [facility] as an outpatient to expedite her dialysis. She unfortunately left prior to discussion with consultants and coming up with a plan for her to have dialysis. I sent lokelma rx to her pharmacy.        Final Clinical Impression(s) / ED Diagnoses Final diagnoses:  Dialysis patient ([place])  Nausea    Rx / DC Orders ED Discharge Orders         Ordered    sodium zirconium cyclosilicate (LOKELMA) 10 g PACK packet  Daily        02/06/21 1051           Gareth Morgan, MD 02/06/21 2252

## 2021-02-06 NOTE — ED Notes (Signed)
Provider at bedside talking with patient who wants to leave AMA.

## 2021-02-06 NOTE — ED Triage Notes (Signed)
Patient presents to ed c/o feeling lightheaded with nausea and vomiting onset 2 days ago, states she had been on dialysis for 2 weeks however hasn't had dialysis in 1 1/2 weeks due to problems with her fistula in her left arm. States she was scheduled for a cath to be placed today however procedure was cancelled due to MD having an emergency.

## 2021-02-06 NOTE — ED Triage Notes (Signed)
Pt here after leaving AMA today. States she is still feeling lightheaded. Pt in NAD.

## 2021-02-08 ENCOUNTER — Other Ambulatory Visit: Payer: Self-pay

## 2021-02-08 ENCOUNTER — Emergency Department (HOSPITAL_COMMUNITY)
Admission: EM | Admit: 2021-02-08 | Discharge: 2021-02-08 | Disposition: A | Discharge status: Home / Self Care | Payer: Medicaid Other | Attending: Emergency Medicine | Admitting: Emergency Medicine

## 2021-02-08 DIAGNOSIS — J45909 Unspecified asthma, uncomplicated: Secondary | ICD-10-CM | POA: Diagnosis not present

## 2021-02-08 DIAGNOSIS — F17210 Nicotine dependence, cigarettes, uncomplicated: Secondary | ICD-10-CM | POA: Insufficient documentation

## 2021-02-08 DIAGNOSIS — I120 Hypertensive chronic kidney disease with stage 5 chronic kidney disease or end stage renal disease: Secondary | ICD-10-CM | POA: Insufficient documentation

## 2021-02-08 DIAGNOSIS — R002 Palpitations: Secondary | ICD-10-CM | POA: Diagnosis present

## 2021-02-08 DIAGNOSIS — F419 Anxiety disorder, unspecified: Secondary | ICD-10-CM | POA: Insufficient documentation

## 2021-02-08 DIAGNOSIS — Z79899 Other long term (current) drug therapy: Secondary | ICD-10-CM | POA: Insufficient documentation

## 2021-02-08 DIAGNOSIS — N186 End stage renal disease: Secondary | ICD-10-CM | POA: Insufficient documentation

## 2021-02-08 LAB — CBC
HCT: 23.1 % — ABNORMAL LOW (ref 36.0–46.0)
Hemoglobin: 7.5 g/dL — ABNORMAL LOW (ref 12.0–15.0)
MCH: 31.4 pg (ref 26.0–34.0)
MCHC: 32.5 g/dL (ref 30.0–36.0)
MCV: 96.7 fL (ref 80.0–100.0)
Platelets: 268 10*3/uL (ref 150–400)
RBC: 2.39 MIL/uL — ABNORMAL LOW (ref 3.87–5.11)
RDW: 13.5 % (ref 11.5–15.5)
WBC: 9 10*3/uL (ref 4.0–10.5)
nRBC: 0 % (ref 0.0–0.2)

## 2021-02-08 LAB — BASIC METABOLIC PANEL
Anion gap: 10 (ref 5–15)
BUN: 66 mg/dL — ABNORMAL HIGH (ref 6–20)
CO2: 19 mmol/L — ABNORMAL LOW (ref 22–32)
Calcium: 8.3 mg/dL — ABNORMAL LOW (ref 8.9–10.3)
Chloride: 109 mmol/L (ref 98–111)
Creatinine, Ser: 13.91 mg/dL — ABNORMAL HIGH (ref 0.44–1.00)
GFR, Estimated: 3 mL/min — ABNORMAL LOW (ref >60)
Glucose, Bld: 93 mg/dL (ref 70–99)
Potassium: 4.4 mmol/L (ref 3.5–5.1)
Sodium: 138 mmol/L (ref 135–145)

## 2021-02-08 NOTE — ED Triage Notes (Signed)
Pt presents to ED POV. Pt c/o heart palpitations upon exertion. Pt reports that she has not had dialysis in 2w d/t access problems. Pt reports 3d ago her K was 5.5.

## 2021-02-08 NOTE — ED Provider Notes (Signed)
[facility] Provider Note   CSN: [id number] Arrival date & time: 02/08/21  1936     History Chief Complaint  Patient presents with  . Palpitations    Natasha Long is a 25 y.o. female.  25 year old female with prior medical history as detailed below presents for evaluation.  Patient reports that has been 2 weeks since her last dialysis.  She reports that she missed an appointment on Thursday of this past week for placement of a temporary dialysis catheter.  This appointment was rescheduled for Thursday of this next week.  Over this weekend she felt some anxiety and palpitations related to her Ms. procedure.  She decided to come to the ED for evaluation.  She is otherwise without complaint.  The history is provided by the patient and medical records.  Illness Location:  Anxiety regarding this complaint.,  Missed dialysis Severity:  Moderate Onset quality:  Gradual Duration:  1 day Timing:  Rare Progression:  Waxing and waning Chronicity:  New      Past Medical History:  Diagnosis Date  . Asthma    as a child, no problem as an adult, no inhaler  . Bipolar affective, manic ([place])    denies -- it was determined to be border line personsity disorder  . Complication of anesthesia    woke up before tube removed, 1 time fought nurses  . Heart murmur    as a infant does not have now  . History of borderline personality disorder   . Hypertension    diagnosed as child; stopped meds at 56 yo  . Hypoglycemia   . Insomnia   . Nephrotic syndrome    sees [facility]  . Nephrotic syndrome   . Neuromuscular disorder ([place])    peripheral neuropathy  . PTSD (post-traumatic stress disorder)   . PTSD (post-traumatic stress disorder)   . Renal disease     Patient Active Problem List   Diagnosis Date Noted  . Anemia   . Suicidal ideations   . GI bleed 01/15/2021  . Influenza vaccine refused 12/19/2020  . Morbid obesity ([place])  01/30/2020  . Abscess of skin 01/30/2020  . 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 Chronic kidney disease 01/30/2020  . Cocaine use, 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[place]) 01/30/2020  . Affective psychosis, bipolar ([place]) 06/27/2019  . Borderline personality disorder ([place]) 11/16/2018  . Moderate cannabis use disorder ([place]) 11/16/2018  . Severe recurrent major depression without psychotic features ([place]) 11/15/2018  . Chronic hypertension 04/04/2018  . Nephrotic syndrome 04/01/2018  . PTSD (post-traumatic stress disorder) 04/01/2018  . Bipolar disorder, current episode depressed, mild ([place]) 04/09/2017    Past Surgical History:  Procedure Laterality Date  . AV FISTULA PLACEMENT Left 10/18/2020   Procedure: LEFT ARM ARTERIOVENOUS (AV) FISTULA CREATION;  Surgeon: Serafina Mitchell, MD;  Location: [facility];  Service: Vascular;  Laterality: Left;  . CHOLECYSTECTOMY    . extraction of wisdom teeth    . RENAL BIOPSY       OB History    Gravida  2   Para  1   Term  1   Preterm      AB      Living  1     SAB      IAB      Ectopic      Multiple      Live Births  1           Family History  Adopted: Yes  Problem Relation Age of Onset  . Diabetes Other   . Hypertension Other     Social History   Tobacco Use  . Smoking status: Current Every Day Smoker    Packs/day: 0.25    Years: 8.00    Pack years: 2.00    Types: Cigarettes  . Smokeless tobacco: Never Used  Vaping Use  . Vaping Use: Never used  Substance Use Topics  . Alcohol use: Not Currently    Comment: "maybe 3 a month."- liquor  . Drug use: Not Currently    Types: Marijuana, Cocaine    Comment: 10/17/20- no cocaine, marijunia -last time 2-3 weeks ago.    Home Medications Prior to Admission medications   Medication Sig Start Date End Date Taking? Authorizing Provider  acetaminophen (TYLENOL) 650 MG CR tablet Take 650-1,300 mg by mouth every 8 (eight) hours as needed for pain.     [provider]  ARIPiprazole (ABILIFY) 5 MG tablet Take 1 tablet (5 mg total) by mouth daily. 01/18/21   Matilde Haymaker, MD  Ascorbic Acid (VITAMIN C PO) Take 1 tablet by mouth 3 (three) times a week.    [provider]  Blood Pressure Monitoring (BLOOD PRESSURE MONITOR 7) DEVI 1 Units by Does not apply route daily. Measure blood pressure daily 02/29/20   [name], MD  calcitRIOL (ROCALTROL) 0.25 MCG capsule Take 0.25 mcg by mouth every Monday, Wednesday, and Friday.  09/25/20   [provider]  gabapentin (NEURONTIN) 100 MG capsule Take 1 capsule (100 mg total) by mouth 2 (two) times daily. 01/17/21 02/16/21  Matilde Haymaker, MD  losartan (COZAAR) 25 MG tablet Take 25 mg by mouth daily. 09/25/20   [provider]  naphazoline-glycerin (CLEAR EYES REDNESS) 0.012-0.2 % SOLN Place 1-2 drops into both eyes 4 (four) times daily as needed for eye irritation.    [provider]  ondansetron (ZOFRAN) 4 MG tablet Take 1 tablet (4 mg total) by mouth every 8 (eight) hours as needed for nausea or vomiting. 01/30/20   [name], MD  sodium bicarbonate 650 MG tablet Take 1,300 mg by mouth 2 (two) times daily. 07/12/20   [provider]  sodium zirconium cyclosilicate (LOKELMA) 10 g PACK packet Take 10 g by mouth daily for 2 days. 02/06/21 02/08/21  Gareth Morgan, MD    Allergies    Prozac [fluoxetine hcl], Wellbutrin [bupropion], and Prednisone  Review of Systems   Review of Systems  All other systems reviewed and are negative.   Physical Exam Updated Vital Signs BP 125/72   Pulse 72   Temp 98.4 F (36.9 C) (Oral)   Resp 12   LMP 01/27/2021 (Approximate)   SpO2 100%   Physical Exam Vitals and nursing note reviewed.  Constitutional:      General: She is not in acute distress.    Appearance: Normal appearance. She is well-developed.  HENT:     Head: Normocephalic and atraumatic.  Eyes:     Conjunctiva/sclera: Conjunctivae normal.      Pupils: Pupils are equal, round, and reactive to light.  Cardiovascular:     Rate and Rhythm: Normal rate and regular rhythm.     Heart sounds: Normal heart sounds.  Pulmonary:     Effort: Pulmonary effort is normal. No respiratory distress.     Breath sounds: Normal breath sounds.  Abdominal:     General: There is no distension.     Palpations: Abdomen is soft.  Tenderness: There is no abdominal tenderness.  Musculoskeletal:        General: No deformity. Normal range of motion.     Cervical back: Normal range of motion and neck supple.  Skin:    General: Skin is warm and dry.  Neurological:     General: No focal deficit present.     Mental Status: She is alert and oriented to person, place, and time. Mental status is at baseline.     Cranial Nerves: No cranial nerve deficit.     Sensory: No sensory deficit.     Motor: No weakness.     Coordination: Coordination normal.     ED Results / Procedures / Treatments   Labs (all labs ordered are listed, but only abnormal results are displayed) Labs Reviewed  CBC - Abnormal; Notable for the following components:      Result Value   RBC 2.39 (*)    Hemoglobin 7.5 (*)    HCT 23.1 (*)    All other components within normal limits  BASIC METABOLIC PANEL - Abnormal; Notable for the following components:   CO2 19 (*)    BUN 66 (*)    Creatinine, Ser 13.91 (*)    Calcium 8.3 (*)    GFR, Estimated 3 (*)    All other components within normal limits    EKG EKG Interpretation  Date/Time:  Saturday February 08 2021 20:05:31 EST Ventricular Rate:  66 PR Interval:  142 QRS Duration: 82 QT Interval:  412 QTC Calculation: 431 R Axis:   56 Text Interpretation: Normal sinus rhythm Normal ECG Confirmed by Dene Gentry [phone]) on 02/08/2021 9:21:26 PM   Radiology No results found.  Procedures Procedures   Medications Ordered in ED Medications - No data to display  ED Course  I have reviewed the triage vital signs and the  nursing notes.  Pertinent labs & imaging results that were available during my care of the patient were reviewed by me and considered in my medical decision making (see chart for details).    MDM Rules/Calculators/A&P                          MDM  Screen Complete  Natasha Long was evaluated in Emergency Department on 02/08/2021 for the symptoms described in the history of present illness. She was evaluated in the context of the global COVID-19 pandemic, which necessitated consideration that the patient might be at risk for infection with the SARS-CoV-2 virus that causes COVID-19. Institutional protocols and algorithms that pertain to the evaluation of patients at risk for COVID-19 are in a state of rapid change based on information released by regulatory bodies including the CDC and federal and state organizations. These policies and algorithms were followed during the patient's care in the ED.   Patient is presenting secondary to anxiety about recently missed vascular procedure with Dr. Trula Slade.  The labs today are without significant abnormality given patient's pre-existing renal disease.  Patient's potassium today is 4.4.  Patient without evidence of fluid overload.  Case discussed briefly with Dr. Jonnie Finner covering for Nephrology.  He agrees that patient does not require inpatient treatment at this time.  Patient is discussed with Dr. Trula Slade covering Vascular.  He stresses that patient needs to follow-up in the outpatient setting for her procedure as scheduled for later this week.      Final Clinical Impression(s) / ED Diagnoses Final diagnoses:  ESRD (end stage renal disease) ([place])  Rx / DC Orders ED Discharge Orders    None       Valarie Merino, MD 02/08/21 2213

## 2021-02-08 NOTE — Discharge Instructions (Addendum)
Return for any problem.  Please make sure that she is making her preoperative visit on Wednesday of this week.  Make sure that you arrive on time for your procedure planned for Thursday of this week.

## 2021-02-08 NOTE — ED Notes (Signed)
Pt d/c by  MD and is provided w/ d.c instructions and follow up care, pt out of ED ambulatory

## 2021-02-10 ENCOUNTER — Telehealth: Payer: Self-pay | Admitting: *Deleted

## 2021-02-10 NOTE — Telephone Encounter (Signed)
Transition Care Management Unsuccessful Follow-up Telephone Call  Date of discharge and from where:  02/08/2021 [facility]  Attempts:  1st Attempt  Reason for unsuccessful TCM follow-up call:  Unable to reach patient

## 2021-02-11 ENCOUNTER — Other Ambulatory Visit (HOSPITAL_COMMUNITY): Payer: Medicaid Other

## 2021-02-11 HISTORY — PX: TUNNELED VENOUS CATHETER PLACEMENT: SHX818

## 2021-02-11 NOTE — Telephone Encounter (Signed)
Transition Care Management Unsuccessful Follow-up Telephone Call  Date of discharge and from where:  [facility]/08/[facility]  Attempts:  2nd Attempt  Reason for unsuccessful TCM follow-up call:  Unable to reach patient

## 2021-02-12 ENCOUNTER — Encounter (HOSPITAL_COMMUNITY): Payer: Self-pay | Admitting: Surgery

## 2021-02-12 DIAGNOSIS — T829XXA Unspecified complication of cardiac and vascular prosthetic device, implant and graft, initial encounter: Secondary | ICD-10-CM | POA: Insufficient documentation

## 2021-02-12 NOTE — Telephone Encounter (Signed)
Transition Care Management Unsuccessful Follow-up Telephone Call  Date of discharge and from where:  02/08/2021 [facility]  Attempts:  3rd Attempt  Reason for unsuccessful TCM follow-up call:  Unable to reach patient

## 2021-02-12 NOTE — Anesthesia Preprocedure Evaluation (Addendum)
Anesthesia Evaluation  Patient identified by MRN, date of birth, ID band Patient awake    Reviewed: Allergy & Precautions, NPO status , Patient's Chart, lab work & pertinent test results  History of Anesthesia Complications Negative for: history of anesthetic complications  Airway Mallampati: I  TM Distance: >3 FB Neck ROM: Full    Dental no notable dental hx.    Pulmonary asthma , Current Smoker,    Pulmonary exam normal        Cardiovascular hypertension, Pt. on medications Normal cardiovascular exam     Neuro/Psych Anxiety Depression Bipolar Disorder negative neurological ROS     GI/Hepatic negative GI ROS, Neg liver ROS,   Endo/Other  Morbid obesity  Renal/GU Dialysis and ESRFRenal disease (HD M/W/F)  negative genitourinary   Musculoskeletal negative musculoskeletal ROS (+)   Abdominal   Peds  Hematology  (+) anemia , Hgb 7.5   Anesthesia Other Findings Day of surgery medications reviewed with patient.  Reproductive/Obstetrics negative OB ROS                            Anesthesia Physical Anesthesia Plan  ASA: III  Anesthesia Plan: General   Post-op Pain Management:    Induction: Intravenous  PONV Risk Score and Plan: 3 and Treatment may vary due to age or medical condition, Ondansetron, Dexamethasone and Midazolam  Airway Management Planned: LMA  Additional Equipment: None  Intra-op Plan:   Post-operative Plan: Extubation in OR  Informed Consent: I have reviewed the patients History and Physical, chart, labs and discussed the procedure including the risks, benefits and alternatives for the proposed anesthesia with the patient or authorized representative who has indicated his/her understanding and acceptance.     Dental advisory given  Plan Discussed with: CRNA  Anesthesia Plan Comments:         Anesthesia Quick Evaluation

## 2021-02-12 NOTE — Progress Notes (Signed)
Patient denies shortness of breath, fever, cough or chest pain.  PCP - Dr  Asencion Noble Cardiologist - n/a  Chest x-ray - n/a EKG - 01/31/21 Stress Test - n/a ECHO - n/a Cardiac Cath - n/a  Anesthesia review: Yes.   STOP now taking any Aspirin (unless otherwise instructed by your surgeon), Aleve, Naproxen, Ibuprofen, Motrin, Advil, Goody's, BC's, all herbal medications, fish oil, and all vitamins.   Coronavirus Screening Covid test on DOS 02/13/21. Do you have any of the following symptoms:  Cough yes/no: No Fever (>100.60F)  yes/no: No Runny nose yes/no: No Sore throat yes/no: No Difficulty breathing/shortness of breath  yes/no: No  Have you traveled in the last 14 days and where? yes/no: No  Patient verbalized understanding of instructions that were given via phone.

## 2021-02-13 ENCOUNTER — Other Ambulatory Visit: Payer: Self-pay | Admitting: Physician Assistant

## 2021-02-13 ENCOUNTER — Encounter (HOSPITAL_COMMUNITY): Payer: Self-pay | Admitting: Surgery

## 2021-02-13 ENCOUNTER — Ambulatory Visit (HOSPITAL_COMMUNITY)
Admission: RE | Admit: 2021-02-13 | Discharge: 2021-02-13 | Disposition: A | Discharge status: Home / Self Care | Payer: Medicaid Other | Attending: Surgery | Admitting: Surgery

## 2021-02-13 ENCOUNTER — Ambulatory Visit (HOSPITAL_COMMUNITY): Payer: Medicaid Other | Admitting: Physician Assistant

## 2021-02-13 ENCOUNTER — Encounter (HOSPITAL_COMMUNITY)
Admission: RE | Disposition: A | Discharge status: Home / Self Care | Payer: Self-pay | Source: Home / Self Care | Attending: Surgery

## 2021-02-13 ENCOUNTER — Other Ambulatory Visit: Payer: Self-pay

## 2021-02-13 DIAGNOSIS — Y841 Kidney dialysis as the cause of abnormal reaction of the patient, or of later complication, without mention of misadventure at the time of the procedure: Secondary | ICD-10-CM | POA: Insufficient documentation

## 2021-02-13 DIAGNOSIS — Z20822 Contact with and (suspected) exposure to covid-19: Secondary | ICD-10-CM | POA: Diagnosis not present

## 2021-02-13 DIAGNOSIS — Z992 Dependence on renal dialysis: Secondary | ICD-10-CM | POA: Diagnosis not present

## 2021-02-13 DIAGNOSIS — N185 Chronic kidney disease, stage 5: Secondary | ICD-10-CM

## 2021-02-13 DIAGNOSIS — Z888 Allergy status to other drugs, medicaments and biological substances status: Secondary | ICD-10-CM | POA: Diagnosis not present

## 2021-02-13 DIAGNOSIS — N186 End stage renal disease: Secondary | ICD-10-CM | POA: Insufficient documentation

## 2021-02-13 DIAGNOSIS — Z79899 Other long term (current) drug therapy: Secondary | ICD-10-CM | POA: Insufficient documentation

## 2021-02-13 DIAGNOSIS — T82510A Breakdown (mechanical) of surgically created arteriovenous fistula, initial encounter: Secondary | ICD-10-CM | POA: Insufficient documentation

## 2021-02-13 HISTORY — DX: Dyspnea, unspecified: R06.00

## 2021-02-13 HISTORY — PX: FISTULA SUPERFICIALIZATION: SHX6341

## 2021-02-13 LAB — POCT I-STAT, CHEM 8
BUN: 37 mg/dL — ABNORMAL HIGH (ref 6–20)
Calcium, Ion: 1.11 mmol/L — ABNORMAL LOW (ref 1.15–1.40)
Chloride: 106 mmol/L (ref 98–111)
Creatinine, Ser: 10.5 mg/dL — ABNORMAL HIGH (ref 0.44–1.00)
Glucose, Bld: 92 mg/dL (ref 70–99)
HCT: 30 % — ABNORMAL LOW (ref 36.0–46.0)
Hemoglobin: 10.2 g/dL — ABNORMAL LOW (ref 12.0–15.0)
Potassium: 3.9 mmol/L (ref 3.5–5.1)
Sodium: 143 mmol/L (ref 135–145)
TCO2: 25 mmol/L (ref 22–32)

## 2021-02-13 LAB — SARS CORONAVIRUS 2 BY RT PCR (HOSPITAL ORDER, PERFORMED IN ~~LOC~~ HOSPITAL LAB): SARS Coronavirus 2: NEGATIVE

## 2021-02-13 LAB — POCT PREGNANCY, URINE: Preg Test, Ur: NEGATIVE

## 2021-02-13 SURGERY — FISTULA SUPERFICIALIZATION
Anesthesia: General

## 2021-02-13 MED ORDER — 0.9 % SODIUM CHLORIDE (POUR BTL) OPTIME
TOPICAL | Status: DC | PRN
  Administered 2021-02-13: 1000 mL

## 2021-02-13 MED ORDER — ORAL CARE MOUTH RINSE: 15.0000 mL | Freq: Once | OROMUCOSAL | Status: AC

## 2021-02-13 MED ORDER — SODIUM CHLORIDE 0.9 % IV SOLN
INTRAVENOUS | Status: AC
  Filled 2021-02-13: qty 1.2

## 2021-02-13 MED ORDER — MIDAZOLAM HCL 2 MG/2ML IJ SOLN
INTRAMUSCULAR | Status: AC
  Filled 2021-02-13: qty 2

## 2021-02-13 MED ORDER — PROPOFOL 10 MG/ML IV BOLUS
INTRAVENOUS | Status: DC | PRN
  Administered 2021-02-13: 30 mg via INTRAVENOUS
  Administered 2021-02-13: 20 mg via INTRAVENOUS
  Administered 2021-02-13: 50 mg via INTRAVENOUS
  Administered 2021-02-13: 200 mg via INTRAVENOUS
  Administered 2021-02-13: 30 mg via INTRAVENOUS
  Administered 2021-02-13: 150 mg via INTRAVENOUS

## 2021-02-13 MED ORDER — SODIUM CHLORIDE 0.9 % IV SOLN: INTRAVENOUS | Status: DC | PRN

## 2021-02-13 MED ORDER — LIDOCAINE 2% (20 MG/ML) 5 ML SYRINGE
INTRAMUSCULAR | Status: DC | PRN
  Administered 2021-02-13: 50 mg via INTRAVENOUS

## 2021-02-13 MED ORDER — OXYCODONE HCL 5 MG PO TABS
5.0000 mg | ORAL_TABLET | Freq: Once | ORAL | Status: AC | PRN
Start: 2021-02-13 — End: 2021-02-13
  Administered 2021-02-13: 5 mg via ORAL

## 2021-02-13 MED ORDER — FENTANYL CITRATE (PF) 250 MCG/5ML IJ SOLN
INTRAMUSCULAR | Status: AC
  Filled 2021-02-13: qty 5

## 2021-02-13 MED ORDER — SODIUM CHLORIDE 0.9 % IV SOLN
INTRAVENOUS | Status: DC | PRN
  Administered 2021-02-13: 500 mL

## 2021-02-13 MED ORDER — OXYCODONE HCL 5 MG/5ML PO SOLN
5.0000 mg | Freq: Once | ORAL | Status: AC | PRN
Start: 2021-02-13 — End: 2021-02-13

## 2021-02-13 MED ORDER — DEXAMETHASONE SODIUM PHOSPHATE 10 MG/ML IJ SOLN
INTRAMUSCULAR | Status: AC
  Filled 2021-02-13: qty 1

## 2021-02-13 MED ORDER — HEPARIN SODIUM (PORCINE) 1000 UNIT/ML IJ SOLN
INTRAMUSCULAR | Status: AC
  Filled 2021-02-13: qty 1

## 2021-02-13 MED ORDER — LACTATED RINGERS IV SOLN: INTRAVENOUS | Status: DC

## 2021-02-13 MED ORDER — CHLORHEXIDINE GLUCONATE 4 % EX LIQD: 60.0000 mL | Freq: Once | CUTANEOUS | Status: DC

## 2021-02-13 MED ORDER — PROPOFOL 10 MG/ML IV BOLUS
INTRAVENOUS | Status: AC
  Filled 2021-02-13: qty 20

## 2021-02-13 MED ORDER — LIDOCAINE-EPINEPHRINE (PF) 1 %-1:200000 IJ SOLN
INTRAMUSCULAR | Status: AC
  Filled 2021-02-13: qty 30

## 2021-02-13 MED ORDER — OXYCODONE HCL 5 MG PO TABS
ORAL_TABLET | ORAL | Status: AC
  Filled 2021-02-13: qty 1

## 2021-02-13 MED ORDER — FENTANYL CITRATE (PF) 100 MCG/2ML IJ SOLN: 25.0000 ug | INTRAMUSCULAR | Status: DC | PRN

## 2021-02-13 MED ORDER — FENTANYL CITRATE (PF) 250 MCG/5ML IJ SOLN
INTRAMUSCULAR | Status: DC | PRN
  Administered 2021-02-13 (×2): 25 ug via INTRAVENOUS
  Administered 2021-02-13 (×2): 50 ug via INTRAVENOUS
  Administered 2021-02-13 (×3): 25 ug via INTRAVENOUS
  Administered 2021-02-13: 50 ug via INTRAVENOUS
  Administered 2021-02-13: 25 ug via INTRAVENOUS
  Administered 2021-02-13 (×2): 50 ug via INTRAVENOUS
  Administered 2021-02-13: 25 ug via INTRAVENOUS
  Administered 2021-02-13: 50 ug via INTRAVENOUS
  Administered 2021-02-13: 25 ug via INTRAVENOUS

## 2021-02-13 MED ORDER — CHLORHEXIDINE GLUCONATE 0.12 % MT SOLN: 15.0000 mL | Freq: Once | OROMUCOSAL | Status: AC

## 2021-02-13 MED ORDER — MIDAZOLAM HCL 2 MG/2ML IJ SOLN
INTRAMUSCULAR | Status: DC | PRN
  Administered 2021-02-13: 2 mg via INTRAVENOUS

## 2021-02-13 MED ORDER — LIDOCAINE 2% (20 MG/ML) 5 ML SYRINGE
INTRAMUSCULAR | Status: AC
  Filled 2021-02-13: qty 5

## 2021-02-13 MED ORDER — DEXTROSE 5 % IV SOLN
INTRAVENOUS | Status: DC | PRN
  Administered 2021-02-13: 3 g via INTRAVENOUS

## 2021-02-13 MED ORDER — CHLORHEXIDINE GLUCONATE 0.12 % MT SOLN
OROMUCOSAL | Status: AC
  Administered 2021-02-13: 15 mL via OROMUCOSAL
  Filled 2021-02-13: qty 15

## 2021-02-13 MED ORDER — ONDANSETRON HCL 4 MG/2ML IJ SOLN
INTRAMUSCULAR | Status: DC | PRN
  Administered 2021-02-13: 4 mg via INTRAVENOUS

## 2021-02-13 MED ORDER — OXYCODONE-ACETAMINOPHEN 5-325 MG PO TABS: 1.0000 | ORAL_TABLET | ORAL | 0 refills | Status: DC | PRN

## 2021-02-13 MED ORDER — ONDANSETRON HCL 4 MG/2ML IJ SOLN
INTRAMUSCULAR | Status: AC
  Filled 2021-02-13: qty 2

## 2021-02-13 MED ORDER — ONDANSETRON HCL 4 MG/2ML IJ SOLN
4.0000 mg | Freq: Once | INTRAMUSCULAR | Status: AC
  Administered 2021-02-13: 4 mg via INTRAVENOUS
  Filled 2021-02-13: qty 2

## 2021-02-13 MED ORDER — PROMETHAZINE HCL 25 MG/ML IJ SOLN: 6.2500 mg | INTRAMUSCULAR | Status: DC | PRN

## 2021-02-13 MED ORDER — SODIUM CHLORIDE 0.9 % IV SOLN: INTRAVENOUS | Status: DC

## 2021-02-13 SURGICAL SUPPLY — 51 items
ARMBAND PINK RESTRICT EXTREMIT (MISCELLANEOUS) ×3 IMPLANT
BAG DECANTER FOR FLEXI CONT (MISCELLANEOUS) ×3 IMPLANT
BIOPATCH RED 1 DISK 7.0 (GAUZE/BANDAGES/DRESSINGS) ×3 IMPLANT
BNDG ELASTIC 4X5.8 VLCR STR LF (GAUZE/BANDAGES/DRESSINGS) ×3 IMPLANT
CANISTER SUCT 3000ML PPV (MISCELLANEOUS) ×3 IMPLANT
CATH PALINDROME-P 19CM W/VT (CATHETERS) IMPLANT
CATH PALINDROME-P 23CM W/VT (CATHETERS) IMPLANT
CATH PALINDROME-P 28CM W/VT (CATHETERS) IMPLANT
CLIP VESOCCLUDE MED 6/CT (CLIP) ×3 IMPLANT
CLIP VESOCCLUDE SM WIDE 6/CT (CLIP) ×3 IMPLANT
COVER PROBE W GEL 5X96 (DRAPES) ×3 IMPLANT
COVER SURGICAL LIGHT HANDLE (MISCELLANEOUS) ×3 IMPLANT
COVER WAND RF STERILE (DRAPES) IMPLANT
DERMABOND ADVANCED (GAUZE/BANDAGES/DRESSINGS) ×2
DERMABOND ADVANCED .7 DNX12 (GAUZE/BANDAGES/DRESSINGS) ×4 IMPLANT
DRAPE C-ARM 42X72 X-RAY (DRAPES) ×3 IMPLANT
DRAPE CHEST BREAST 15X10 FENES (DRAPES) ×3 IMPLANT
ELECT REM PT RETURN 9FT ADLT (ELECTROSURGICAL) ×3
ELECTRODE REM PT RTRN 9FT ADLT (ELECTROSURGICAL) ×2 IMPLANT
GAUZE 4X4 16PLY RFD (DISPOSABLE) ×3 IMPLANT
GLOVE BIOGEL PI IND STRL 7.5 (GLOVE) ×2 IMPLANT
GLOVE BIOGEL PI INDICATOR 7.5 (GLOVE) ×1
GLOVE SURG SS PI 7.5 STRL IVOR (GLOVE) ×3 IMPLANT
GOWN STRL REUS W/ TWL LRG LVL3 (GOWN DISPOSABLE) ×2 IMPLANT
GOWN STRL REUS W/ TWL XL LVL3 (GOWN DISPOSABLE) ×8 IMPLANT
GOWN STRL REUS W/TWL LRG LVL3 (GOWN DISPOSABLE) ×1
GOWN STRL REUS W/TWL XL LVL3 (GOWN DISPOSABLE) ×4
HEMOSTAT SNOW SURGICEL 2X4 (HEMOSTASIS) IMPLANT
KIT BASIN OR (CUSTOM PROCEDURE TRAY) ×3 IMPLANT
KIT PALINDROME-P 55CM (CATHETERS) IMPLANT
KIT TURNOVER KIT B (KITS) ×3 IMPLANT
NEEDLE 18GX1X1/2 (RX/OR ONLY) (NEEDLE) ×3 IMPLANT
NEEDLE HYPO 25GX1X1/2 BEV (NEEDLE) ×3 IMPLANT
NS IRRIG 1000ML POUR BTL (IV SOLUTION) ×3 IMPLANT
PACK CV ACCESS (CUSTOM PROCEDURE TRAY) ×3 IMPLANT
PACK SURGICAL SETUP 50X90 (CUSTOM PROCEDURE TRAY) ×3 IMPLANT
PAD ARMBOARD 7.5X6 YLW CONV (MISCELLANEOUS) ×6 IMPLANT
SOAP 2 % CHG 4 OZ (WOUND CARE) ×3 IMPLANT
SUT ETHILON 3 0 PS 1 (SUTURE) ×3 IMPLANT
SUT PROLENE 6 0 CC (SUTURE) ×9 IMPLANT
SUT VIC AB 3-0 SH 27 (SUTURE) ×2
SUT VIC AB 3-0 SH 27X BRD (SUTURE) ×4 IMPLANT
SUT VICRYL 4-0 PS2 18IN ABS (SUTURE) ×3 IMPLANT
SYR 10ML LL (SYRINGE) ×3 IMPLANT
SYR 20ML LL LF (SYRINGE) ×6 IMPLANT
SYR 5ML LL (SYRINGE) ×3 IMPLANT
SYR CONTROL 10ML LL (SYRINGE) ×3 IMPLANT
TOWEL GREEN STERILE (TOWEL DISPOSABLE) ×3 IMPLANT
TOWEL GREEN STERILE FF (TOWEL DISPOSABLE) ×3 IMPLANT
UNDERPAD 30X36 HEAVY ABSORB (UNDERPADS AND DIAPERS) ×3 IMPLANT
WATER STERILE IRR 1000ML POUR (IV SOLUTION) ×3 IMPLANT

## 2021-02-13 NOTE — Op Note (Signed)
    Patient name: Natasha Long MRN: [id number] DOB: 1996-05-06 Sex: female  02/13/2021 Pre-operative Diagnosis: End-stage renal disease Post-operative diagnosis:  Same Surgeon:  Annamarie Major Assistants:  Ivin Booty Procedure:   Revision of left brachiocephalic fistula via elevation and branch ligation Anesthesia: General Blood Loss: Minimal Specimens: None  Findings: Excellent appearing cephalic vein in the upper arm  Indications: The patient is having trouble with cannulation of her fistula.  She comes in today for elevation.  Procedure:  The patient was identified in the holding area and taken to [place] [address]  The patient was then placed supine on the table. general anesthesia was administered.  The patient was prepped and draped in the usual sterile fashion.  A time out was called and antibiotics were administered.  A PA was necessary to assist with technical details and expedite the procedure.  Ultrasound was used to evaluate the fistula in the upper arm.  It was deep on inspection.  2 longitudinal incisions were made anteriorly to the fistula.  The fistula was circumferentially dissected out with scissor and cautery dissection.  There were several side branches which were ligated between silk ties.  The vein was fully mobilized from the antecubital crease up to the shoulder.  Next, the subcutaneous tissue was reapproximated posterior to the fistula with interrupted 3-0 Vicryl.  The skin was then closed directly anterior to the fistula with 3-0 Vicryl.  Dermabond was applied.  There were no immediate complications.   Disposition: To PACU stable.   Theotis Burrow, M.D., [facility] [place] Office: [phone] Pager:  [phone]

## 2021-02-13 NOTE — Discharge Instructions (Signed)
[facility]  Discharge Instructions  AV Fistula or Graft Surgery for Dialysis Access  Please refer to the following instructions for your post-procedure care. Your surgeon or physician assistant will discuss any changes with you.  Activity  You may drive the day following your surgery, if you are comfortable and no longer taking prescription pain medication. Resume full activity as the soreness in your incision resolves.  Bathing/Showering  You may shower after you go home. Keep your incision dry for 48 hours. Do not soak in a bathtub, hot tub, or swim until the incision heals completely. You may not shower if you have a hemodialysis catheter.  Incision Care  Clean your incision with mild soap and water after 48 hours. Pat the area dry with a clean towel. You do not need a bandage unless otherwise instructed. Do not apply any ointments or creams to your incision. You may have skin glue on your incision. Do not peel it off. It will come off on its own in about one week. Your arm may swell a bit after surgery. To reduce swelling use pillows to elevate your arm so it is above your heart. Your doctor will tell you if you need to lightly wrap your arm with an ACE bandage.  Diet  Resume your normal diet. There are not special food restrictions following this procedure. In order to heal from your surgery, it is CRITICAL to get adequate nutrition. Your body requires vitamins, minerals, and protein. Vegetables are the best source of vitamins and minerals. Vegetables also provide the perfect balance of protein. Processed food has little nutritional value, so try to avoid this.  Medications  Resume taking all of your medications. If your incision is causing pain, you may take over-the counter pain relievers such as acetaminophen (Tylenol). If you were prescribed a stronger pain medication, please be aware these medications can cause nausea and constipation. Prevent  nausea by taking the medication with a snack or meal. Avoid constipation by drinking plenty of fluids and eating foods with high amount of fiber, such as fruits, vegetables, and grains.  Do not take Tylenol if you are taking prescription pain medications.  Follow up Your surgeon may want to see you in the office following your access surgery. If so, this will be arranged at the time of your surgery.  Please call us immediately for any of the following conditions:  . Increased pain, redness, drainage (pus) from your incision site . Fever of 101 degrees or higher . Severe or worsening pain at your incision site . Hand pain or numbness. .  Reduce your risk of vascular disease:  . Stop smoking. If you would like help, call [facility] at 1-800-QUIT-NOW 715-875-9073) or [place] at (628)409-1582  . Manage your cholesterol . Maintain a desired weight . Control your diabetes . Keep your blood pressure down  Dialysis  It will take several weeks to several months for your new dialysis access to be ready for use. Your surgeon will determine when it is okay to use it. Your nephrologist will continue to direct your dialysis. You can continue to use your Permcath until your new access is ready for use.   02/13/2021 [name] [id number] [birth year]  Surgeon(s): Serafina Mitchell, MD  Procedure(s): LEFT BRACHIOCEPHALIC ARTERIOVENOUS FISTULA SUPERFICIALIZATION   May stick graft immediately   May stick graft on designated area only:   X Do not stick AV Fistula for 4 weeks    If you  have any questions, please call the office at [phone].

## 2021-02-13 NOTE — Interval H&P Note (Signed)
History and Physical Interval Note:  02/13/2021 11:10 AM  Natasha Long  has presented today for surgery, with the diagnosis of ESRD.  The various methods of treatment have been discussed with the patient and family. After consideration of risks, benefits and other options for treatment, the patient has consented to  Procedure(s): LEFT BRACHIOCEPHALIC ARTERIOVENOUS FISTULA SUPERFICIALIZATION (Left) INSERTION OF DIALYSIS CATHETER (N/A) as a surgical intervention.  The patient's history has been reviewed, patient examined, no change in status, stable for surgery.  I have reviewed the patient's chart and labs.  Questions were answered to the patient's satisfaction.     Annamarie Major

## 2021-02-13 NOTE — Anesthesia Postprocedure Evaluation (Signed)
Anesthesia Post Note  Patient: Natasha Long  Procedure(s) Performed: LEFT BRACHIOCEPHALIC ARTERIOVENOUS FISTULA SUPERFICIALIZATION (Left )     Patient location during evaluation: PACU Anesthesia Type: General Level of consciousness: awake and alert and oriented Pain management: pain level controlled Vital Signs Assessment: post-procedure vital signs reviewed and stable Respiratory status: spontaneous breathing, nonlabored ventilation and respiratory function stable Cardiovascular status: blood pressure returned to baseline Postop Assessment: no apparent nausea or vomiting Anesthetic complications: no   No complications documented.  Last Vitals:  Vitals:   02/13/21 1335 02/13/21 1350  BP: (!) 147/72 (!) 143/59  Pulse: 66   Resp: 18 11  Temp:  36.4 C  SpO2: 97%     Last Pain:  Vitals:   02/13/21 1350  TempSrc:   PainSc: [place]

## 2021-02-13 NOTE — Progress Notes (Signed)
Spoke with Adrian/ [facility] short stay nurse. Reports pt had TDC placed in right upper chest by [facility] two days ago. Will update consent form.

## 2021-02-13 NOTE — Transfer of Care (Signed)
Immediate Anesthesia Transfer of Care Note  Patient: Natasha Long  Procedure(s) Performed: LEFT BRACHIOCEPHALIC ARTERIOVENOUS FISTULA SUPERFICIALIZATION (Left )  Patient Location: PACU  Anesthesia Type:General  Level of Consciousness: awake, alert  and oriented  Airway & Oxygen Therapy: Patient Spontanous Breathing  Post-op Assessment: Report given to RN, Post -op Vital signs reviewed and stable and Patient moving all extremities X 4  Post vital signs: Reviewed and stable  Last Vitals:  Vitals Value Taken Time  BP    Temp    Pulse    Resp    SpO2      Last Pain:  Vitals:   02/13/21 0835  TempSrc:   PainSc: 0-No pain      Patients Stated Pain Goal: 3 ([id number])  Complications: No complications documented.

## 2021-02-13 NOTE — Anesthesia Procedure Notes (Signed)
Procedure Name: LMA Insertion Date/Time: 02/13/2021 12:01 PM Performed by: Harden Mo, CRNA Pre-anesthesia Checklist: Patient identified, Emergency Drugs available, Suction available and Patient being monitored Patient Re-evaluated:Patient Re-evaluated prior to induction Oxygen Delivery Method: Circle System Utilized Preoxygenation: Pre-oxygenation with 100% oxygen Induction Type: IV induction Ventilation: Mask ventilation without difficulty LMA: LMA inserted LMA Size: 4.0 Number of attempts: 1 Airway Equipment and Method: Bite block Placement Confirmation: positive ETCO2 Tube secured with: Tape Dental Injury: Teeth and Oropharynx as per pre-operative assessment

## 2021-02-14 ENCOUNTER — Encounter (HOSPITAL_COMMUNITY): Payer: Self-pay | Admitting: Surgery

## 2021-03-05 ENCOUNTER — Encounter: Payer: Self-pay | Admitting: Surgery

## 2021-03-18 ENCOUNTER — Other Ambulatory Visit: Payer: Self-pay

## 2021-03-18 ENCOUNTER — Emergency Department (HOSPITAL_COMMUNITY): Payer: Medicaid Other

## 2021-03-18 ENCOUNTER — Emergency Department (HOSPITAL_COMMUNITY)
Admission: EM | Admit: 2021-03-18 | Discharge: 2021-03-18 | Disposition: A | Discharge status: Home / Self Care | Payer: Medicaid Other | Attending: Emergency Medicine | Admitting: Emergency Medicine

## 2021-03-18 ENCOUNTER — Encounter (HOSPITAL_COMMUNITY): Payer: Self-pay | Admitting: Emergency Medicine

## 2021-03-18 DIAGNOSIS — N189 Chronic kidney disease, unspecified: Secondary | ICD-10-CM | POA: Diagnosis not present

## 2021-03-18 DIAGNOSIS — J45909 Unspecified asthma, uncomplicated: Secondary | ICD-10-CM | POA: Diagnosis not present

## 2021-03-18 DIAGNOSIS — I129 Hypertensive chronic kidney disease with stage 1 through stage 4 chronic kidney disease, or unspecified chronic kidney disease: Secondary | ICD-10-CM | POA: Insufficient documentation

## 2021-03-18 DIAGNOSIS — R630 Anorexia: Secondary | ICD-10-CM | POA: Diagnosis not present

## 2021-03-18 DIAGNOSIS — F17210 Nicotine dependence, cigarettes, uncomplicated: Secondary | ICD-10-CM | POA: Insufficient documentation

## 2021-03-18 DIAGNOSIS — R1013 Epigastric pain: Secondary | ICD-10-CM | POA: Insufficient documentation

## 2021-03-18 DIAGNOSIS — R112 Nausea with vomiting, unspecified: Secondary | ICD-10-CM | POA: Insufficient documentation

## 2021-03-18 DIAGNOSIS — R1084 Generalized abdominal pain: Secondary | ICD-10-CM

## 2021-03-18 LAB — COMPREHENSIVE METABOLIC PANEL
ALT: 10 U/L (ref 0–44)
AST: 11 U/L — ABNORMAL LOW (ref 15–41)
Albumin: 3.4 g/dL — ABNORMAL LOW (ref 3.5–5.0)
Alkaline Phosphatase: 51 U/L (ref 38–126)
Anion gap: 13 (ref 5–15)
BUN: 68 mg/dL — ABNORMAL HIGH (ref 6–20)
CO2: 17 mmol/L — ABNORMAL LOW (ref 22–32)
Calcium: 7.4 mg/dL — ABNORMAL LOW (ref 8.9–10.3)
Chloride: 109 mmol/L (ref 98–111)
Creatinine, Ser: 17.1 mg/dL — ABNORMAL HIGH (ref 0.44–1.00)
GFR, Estimated: 3 mL/min — ABNORMAL LOW (ref >60)
Glucose, Bld: 95 mg/dL (ref 70–99)
Potassium: 4.4 mmol/L (ref 3.5–5.1)
Sodium: 139 mmol/L (ref 135–145)
Total Bilirubin: 0.2 mg/dL — ABNORMAL LOW (ref 0.3–1.2)
Total Protein: 6.8 g/dL (ref 6.5–8.1)

## 2021-03-18 LAB — CBC
HCT: 31.7 % — ABNORMAL LOW (ref 36.0–46.0)
Hemoglobin: 9.9 g/dL — ABNORMAL LOW (ref 12.0–15.0)
MCH: 29.9 pg (ref 26.0–34.0)
MCHC: 31.2 g/dL (ref 30.0–36.0)
MCV: 95.8 fL (ref 80.0–100.0)
Platelets: 245 10*3/uL (ref 150–400)
RBC: 3.31 MIL/uL — ABNORMAL LOW (ref 3.87–5.11)
RDW: 13.4 % (ref 11.5–15.5)
WBC: 7.2 10*3/uL (ref 4.0–10.5)
nRBC: 0 % (ref 0.0–0.2)

## 2021-03-18 LAB — I-STAT BETA HCG BLOOD, ED (MC, WL, AP ONLY): I-stat hCG, quantitative: <5 m[IU]/mL (ref <5)

## 2021-03-18 LAB — LIPASE, BLOOD: Lipase: 84 U/L — ABNORMAL HIGH (ref 11–51)

## 2021-03-18 MED ORDER — DICYCLOMINE HCL 10 MG PO CAPS
10.0000 mg | ORAL_CAPSULE | Freq: Once | ORAL | Status: AC
  Administered 2021-03-18: 10 mg via ORAL
  Filled 2021-03-18: qty 1

## 2021-03-18 MED ORDER — ONDANSETRON 4 MG PO TBDP
4.0000 mg | ORAL_TABLET | Freq: Once | ORAL | Status: AC
  Administered 2021-03-18: 4 mg via ORAL
  Filled 2021-03-18: qty 1

## 2021-03-18 MED ORDER — DICYCLOMINE HCL 10 MG/ML IM SOLN
20.0000 mg | Freq: Once | INTRAMUSCULAR | Status: DC
  Filled 2021-03-18: qty 2

## 2021-03-18 MED ORDER — ONDANSETRON 4 MG PO TBDP: 4.0000 mg | ORAL_TABLET | Freq: Three times a day (TID) | ORAL | 0 refills | Status: DC | PRN

## 2021-03-18 NOTE — ED Notes (Signed)
PO Challenged [employer]
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
## 2021-03-18 NOTE — ED Triage Notes (Signed)
Pt c/o abdominal pain that started Sunday night, worsening today. Pt developed diarrhea during the day as well. Dialysis pt, MWF, states she has missed her last 4 appointments due to transportation issues.

## 2021-03-18 NOTE — ED Notes (Signed)
Patient at this time refusing COVID swab and bentyl injection. States "all you want to do is hurt me". ED MD made aware.

## 2021-03-18 NOTE — Discharge Instructions (Signed)
You have been seen in the Emergency Department (ED) today for nausea and vomiting.  Your work up today has not shown a clear cause for your symptoms. You have been prescribed Zofran; please use as prescribed as needed for your nausea.  Follow up with your doctor as soon as possible regarding today's emergent visit and your symptoms of nausea.   Please be sure to make your next dialysis appointment.   Return to the ED if you develop abdominal, bloody vomiting, bloody diarrhea, if you are unable to tolerate fluids due to vomiting, or if you develop other symptoms that concern you.

## 2021-04-18 ENCOUNTER — Emergency Department (HOSPITAL_COMMUNITY)
Admission: EM | Admit: 2021-04-18 | Discharge: 2021-04-18 | Disposition: A | Discharge status: Home / Self Care | Payer: Medicaid Other | Attending: Emergency Medicine | Admitting: Emergency Medicine

## 2021-04-18 ENCOUNTER — Other Ambulatory Visit: Payer: Self-pay

## 2021-04-18 ENCOUNTER — Encounter (HOSPITAL_COMMUNITY): Payer: Self-pay

## 2021-04-18 DIAGNOSIS — N186 End stage renal disease: Secondary | ICD-10-CM | POA: Diagnosis not present

## 2021-04-18 DIAGNOSIS — Z79899 Other long term (current) drug therapy: Secondary | ICD-10-CM | POA: Insufficient documentation

## 2021-04-18 DIAGNOSIS — I120 Hypertensive chronic kidney disease with stage 5 chronic kidney disease or end stage renal disease: Secondary | ICD-10-CM | POA: Insufficient documentation

## 2021-04-18 DIAGNOSIS — F17210 Nicotine dependence, cigarettes, uncomplicated: Secondary | ICD-10-CM | POA: Insufficient documentation

## 2021-04-18 DIAGNOSIS — R42 Dizziness and giddiness: Secondary | ICD-10-CM | POA: Diagnosis not present

## 2021-04-18 DIAGNOSIS — J45909 Unspecified asthma, uncomplicated: Secondary | ICD-10-CM | POA: Diagnosis not present

## 2021-04-18 DIAGNOSIS — Z202 Contact with and (suspected) exposure to infections with a predominantly sexual mode of transmission: Secondary | ICD-10-CM

## 2021-04-18 DIAGNOSIS — Z992 Dependence on renal dialysis: Secondary | ICD-10-CM | POA: Insufficient documentation

## 2021-04-18 NOTE — Discharge Instructions (Addendum)
There are many options for quick evaluation and management of certain GYN issues that do not require a long wait time or big bill from the emergency department.   Consider these options for your care in the future:   Walk-ins for certain complaints available at:   [facility] [address]  [phone]  See hours at [email or web address]   [facility] for [employer] at [facility]  [place]  ([place] for [employer] at [employer]  [phone]   You can make an appointment to see a GYN provider:   Center for [place] at [place]  [phone]   Center for [place] at [facility]  [place]  [phone]   [facility] for [place] at [place]  [phone]   Center for [place] at [facility]  [address], [place]  [phone]   If you already have an established OB/GYN provider in the area you can make an appointment with them as well.

## 2021-04-18 NOTE — ED Triage Notes (Signed)
Pt bib [facility]. Pt just received her dialysis treatment this morning , and stated experiencing dizziness and weakness afterwards.  Pt also wants rule out for STD while here.   Bp 176/96  89 pulse 100% RA
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
## 2021-04-29 ENCOUNTER — Other Ambulatory Visit: Payer: Self-pay | Admitting: Family Medicine

## 2021-04-29 ENCOUNTER — Other Ambulatory Visit: Payer: Self-pay | Admitting: Critical Care Medicine

## 2021-04-29 NOTE — Telephone Encounter (Signed)
Requested medication (s) are due for refill today: Yes  Requested medication (s) are on the active medication list: No  Last refill:  8  months ago  Future visit scheduled: No  Notes to clinic:  Unable to refill per protocol, discontinued by hospital provider     Requested Prescriptions  Pending Prescriptions Disp Refills   gabapentin (NEURONTIN) 600 MG tablet [Pharmacy Med Name: gabapentin 600 mg tablet] 45 tablet 2    Sig: TAKE 1/2 TABLET BY MOUTH 3 TIMES DAILY      Neurology: Anticonvulsants - gabapentin Passed - 04/29/2021  4:10 PM      Passed - Valid encounter within last 12 months    Recent Outpatient Visits           4 months ago Vaginal discharge   [place], Deborah B, MD   1 year ago Cutaneous abscess of other site   [place], Patrick E, MD   1 year ago CKD (chronic kidney disease) stage 4, GFR 15-29 ml/min [facility])   [place] [name], MD   1 year ago Right foot pain   [place] Kerin Perna, NP   3 years ago Bipolar disorder, current episode mixed, severe, with psychotic features [facility])   [place] Clent Demark, PA-C

## 2021-06-02 ENCOUNTER — Emergency Department (HOSPITAL_COMMUNITY): Payer: Medicaid Other

## 2021-06-02 ENCOUNTER — Emergency Department (HOSPITAL_COMMUNITY)
Admission: EM | Admit: 2021-06-02 | Discharge: 2021-06-02 | Disposition: A | Discharge status: Home / Self Care | Payer: Medicaid Other | Attending: Emergency Medicine | Admitting: Emergency Medicine

## 2021-06-02 ENCOUNTER — Other Ambulatory Visit: Payer: Self-pay

## 2021-06-02 ENCOUNTER — Encounter (HOSPITAL_COMMUNITY): Payer: Self-pay

## 2021-06-02 DIAGNOSIS — I120 Hypertensive chronic kidney disease with stage 5 chronic kidney disease or end stage renal disease: Secondary | ICD-10-CM | POA: Diagnosis not present

## 2021-06-02 DIAGNOSIS — R410 Disorientation, unspecified: Secondary | ICD-10-CM | POA: Diagnosis not present

## 2021-06-02 DIAGNOSIS — F17210 Nicotine dependence, cigarettes, uncomplicated: Secondary | ICD-10-CM | POA: Insufficient documentation

## 2021-06-02 DIAGNOSIS — N186 End stage renal disease: Secondary | ICD-10-CM | POA: Insufficient documentation

## 2021-06-02 DIAGNOSIS — R112 Nausea with vomiting, unspecified: Secondary | ICD-10-CM | POA: Insufficient documentation

## 2021-06-02 DIAGNOSIS — J45909 Unspecified asthma, uncomplicated: Secondary | ICD-10-CM | POA: Insufficient documentation

## 2021-06-02 DIAGNOSIS — Z79899 Other long term (current) drug therapy: Secondary | ICD-10-CM | POA: Insufficient documentation

## 2021-06-02 DIAGNOSIS — R109 Unspecified abdominal pain: Secondary | ICD-10-CM | POA: Diagnosis not present

## 2021-06-02 DIAGNOSIS — Z992 Dependence on renal dialysis: Secondary | ICD-10-CM | POA: Diagnosis present

## 2021-06-02 DIAGNOSIS — R197 Diarrhea, unspecified: Secondary | ICD-10-CM | POA: Insufficient documentation

## 2021-06-02 LAB — COMPREHENSIVE METABOLIC PANEL
ALT: 11 U/L (ref 0–44)
AST: 11 U/L — ABNORMAL LOW (ref 15–41)
Albumin: 3.7 g/dL (ref 3.5–5.0)
Alkaline Phosphatase: 58 U/L (ref 38–126)
Anion gap: 15 (ref 5–15)
BUN: 81 mg/dL — ABNORMAL HIGH (ref 6–20)
CO2: 20 mmol/L — ABNORMAL LOW (ref 22–32)
Calcium: 8.6 mg/dL — ABNORMAL LOW (ref 8.9–10.3)
Chloride: 106 mmol/L (ref 98–111)
Creatinine, Ser: 13.84 mg/dL — ABNORMAL HIGH (ref 0.44–1.00)
GFR, Estimated: 3 mL/min — ABNORMAL LOW (ref >60)
Glucose, Bld: 101 mg/dL — ABNORMAL HIGH (ref 70–99)
Potassium: 4.5 mmol/L (ref 3.5–5.1)
Sodium: 141 mmol/L (ref 135–145)
Total Bilirubin: 0.6 mg/dL (ref 0.3–1.2)
Total Protein: 7 g/dL (ref 6.5–8.1)

## 2021-06-02 LAB — CBC
HCT: 29.5 % — ABNORMAL LOW (ref 36.0–46.0)
Hemoglobin: 9.4 g/dL — ABNORMAL LOW (ref 12.0–15.0)
MCH: 30 pg (ref 26.0–34.0)
MCHC: 31.9 g/dL (ref 30.0–36.0)
MCV: 94.2 fL (ref 80.0–100.0)
Platelets: 197 10*3/uL (ref 150–400)
RBC: 3.13 MIL/uL — ABNORMAL LOW (ref 3.87–5.11)
RDW: 15.5 % (ref 11.5–15.5)
WBC: 8.2 10*3/uL (ref 4.0–10.5)
nRBC: 0 % (ref 0.0–0.2)

## 2021-06-02 LAB — WET PREP, GENITAL
Sperm: NONE SEEN
Trich, Wet Prep: NONE SEEN
Yeast Wet Prep HPF POC: NONE SEEN

## 2021-06-02 LAB — URINALYSIS, ROUTINE W REFLEX MICROSCOPIC
Bilirubin Urine: NEGATIVE
Glucose, UA: 50 mg/dL — AB
Hgb urine dipstick: NEGATIVE
Ketones, ur: NEGATIVE mg/dL
Leukocytes,Ua: NEGATIVE
Nitrite: NEGATIVE
Protein, ur: 100 mg/dL — AB
Specific Gravity, Urine: 1.01 (ref 1.005–1.030)
pH: 7 (ref 5.0–8.0)

## 2021-06-02 LAB — LIPASE, BLOOD: Lipase: 62 U/L — ABNORMAL HIGH (ref 11–51)

## 2021-06-02 LAB — HIV ANTIBODY (ROUTINE TESTING W REFLEX): HIV Screen 4th Generation wRfx: NONREACTIVE

## 2021-06-02 LAB — I-STAT BETA HCG BLOOD, ED (MC, WL, AP ONLY): I-stat hCG, quantitative: <5 m[IU]/mL (ref <5)

## 2021-06-02 MED ORDER — ALUM & MAG HYDROXIDE-SIMETH 200-200-20 MG/5ML PO SUSP
30.0000 mL | Freq: Once | ORAL | Status: AC
  Administered 2021-06-02: 30 mL via ORAL
  Filled 2021-06-02: qty 30

## 2021-06-02 MED ORDER — FAMOTIDINE IN NACL 20-0.9 MG/50ML-% IV SOLN
20.0000 mg | Freq: Once | INTRAVENOUS | Status: AC
  Administered 2021-06-02: 20 mg via INTRAVENOUS
  Filled 2021-06-02: qty 50

## 2021-06-02 MED ORDER — ONDANSETRON 4 MG PO TBDP
4.0000 mg | ORAL_TABLET | Freq: Once | ORAL | Status: AC
  Administered 2021-06-02: 4 mg via ORAL
  Filled 2021-06-02: qty 1

## 2021-06-02 NOTE — ED Provider Notes (Signed)
I provided a substantive portion of the care of this patient.  I personally performed the entirety of the medical decision making for this encounter.  EKG Interpretation  Date/Time:  Monday June 02 2021 12:32:55 EDT Ventricular Rate:  69 PR Interval:  144 QRS Duration: 84 QT Interval:  414 QTC Calculation: 443 R Axis:   61 Text Interpretation: Normal sinus rhythm Normal ECG Confirmed by Lacretia Leigh ([id number]) on 06/02/2021 1:80:41 PM   25 year old female with history of end-stage renal disease who has missed dialysis presents with lethargy confusion and belly pain.  Patient alert and oriented x4 at this time.  Offered head CT due to confusion but she has deferred.  States she gets this way when she misses dialysis.  Confusion was yesterday and was not observed here.  Will discharge   Lacretia Leigh, MD 06/02/21 1447
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
## 2021-06-02 NOTE — ED Triage Notes (Signed)
Pt states that she missed her last 4 dialysis treatments and endorses lethargy, confusion, and abdominal pain.

## 2021-06-02 NOTE — Discharge Instructions (Addendum)
  You were evaluated in the Emergency Department and after careful evaluation, we did not find any emergent condition requiring admission or further testing in the hospital.   Your exam/testing today was overall reassuring.  Symptoms seem to be due to need for dialysis.  Please make sure that you go to your dialysis tomorrow.  Your STD testing will result on MyChart in the next 2 days, if is positive you are not to come back to the emergency department to get treatment.  Is very important that you go to your dialysis.  If you have any new or worse concerning symptoms then please go back to the ER. Please return to the Emergency Department if you experience any worsening of your condition.  Thank you for allowing [place] to be a part of your care. Please speak to your pharmacist about any new medications prescribed today in regards to side effects or interactions with other medications.

## 2021-06-03 LAB — GC/CHLAMYDIA PROBE AMP (~~LOC~~) NOT AT ARMC
Chlamydia: NEGATIVE
Comment: NEGATIVE
Comment: NORMAL
Neisseria Gonorrhea: NEGATIVE

## 2021-06-03 LAB — RPR: RPR Ser Ql: NONREACTIVE

## 2021-06-07 ENCOUNTER — Encounter (INDEPENDENT_AMBULATORY_CARE_PROVIDER_SITE_OTHER): Payer: Self-pay

## 2021-06-20 ENCOUNTER — Emergency Department (HOSPITAL_COMMUNITY): Payer: Medicaid Other

## 2021-06-20 ENCOUNTER — Other Ambulatory Visit: Payer: Self-pay

## 2021-06-20 ENCOUNTER — Emergency Department (HOSPITAL_COMMUNITY)
Admission: EM | Admit: 2021-06-20 | Discharge: 2021-06-21 | Disposition: A | Discharge status: Home / Self Care | Payer: Medicaid Other | Attending: Emergency Medicine | Admitting: Emergency Medicine

## 2021-06-20 ENCOUNTER — Encounter (HOSPITAL_COMMUNITY): Payer: Self-pay

## 2021-06-20 DIAGNOSIS — Z992 Dependence on renal dialysis: Secondary | ICD-10-CM | POA: Insufficient documentation

## 2021-06-20 DIAGNOSIS — R42 Dizziness and giddiness: Secondary | ICD-10-CM | POA: Diagnosis not present

## 2021-06-20 DIAGNOSIS — R519 Headache, unspecified: Secondary | ICD-10-CM | POA: Diagnosis present

## 2021-06-20 DIAGNOSIS — N186 End stage renal disease: Secondary | ICD-10-CM | POA: Insufficient documentation

## 2021-06-20 DIAGNOSIS — Z5321 Procedure and treatment not carried out due to patient leaving prior to being seen by health care provider: Secondary | ICD-10-CM | POA: Diagnosis not present

## 2021-06-20 LAB — CBC WITH DIFFERENTIAL/PLATELET
Abs Immature Granulocytes: 0.01 10*3/uL (ref 0.00–0.07)
Basophils Absolute: 0 10*3/uL (ref 0.0–0.1)
Basophils Relative: 0 %
Eosinophils Absolute: 0.3 10*3/uL (ref 0.0–0.5)
Eosinophils Relative: 5 %
HCT: 27.5 % — ABNORMAL LOW (ref 36.0–46.0)
Hemoglobin: 9 g/dL — ABNORMAL LOW (ref 12.0–15.0)
Immature Granulocytes: 0 %
Lymphocytes Relative: 26 %
Lymphs Abs: 1.8 10*3/uL (ref 0.7–4.0)
MCH: 30.5 pg (ref 26.0–34.0)
MCHC: 32.7 g/dL (ref 30.0–36.0)
MCV: 93.2 fL (ref 80.0–100.0)
Monocytes Absolute: 0.5 10*3/uL (ref 0.1–1.0)
Monocytes Relative: 8 %
Neutro Abs: 4.3 10*3/uL (ref 1.7–7.7)
Neutrophils Relative %: 61 %
Platelets: 190 10*3/uL (ref 150–400)
RBC: 2.95 MIL/uL — ABNORMAL LOW (ref 3.87–5.11)
RDW: 15.4 % (ref 11.5–15.5)
WBC: 7 10*3/uL (ref 4.0–10.5)
nRBC: 0 % (ref 0.0–0.2)

## 2021-06-20 LAB — I-STAT BETA HCG BLOOD, ED (MC, WL, AP ONLY): I-stat hCG, quantitative: <5 m[IU]/mL (ref <5)

## 2021-06-20 LAB — BASIC METABOLIC PANEL
Anion gap: 12 (ref 5–15)
BUN: 95 mg/dL — ABNORMAL HIGH (ref 6–20)
CO2: 18 mmol/L — ABNORMAL LOW (ref 22–32)
Calcium: 8.5 mg/dL — ABNORMAL LOW (ref 8.9–10.3)
Chloride: 112 mmol/L — ABNORMAL HIGH (ref 98–111)
Creatinine, Ser: 15.21 mg/dL — ABNORMAL HIGH (ref 0.44–1.00)
GFR, Estimated: 3 mL/min — ABNORMAL LOW (ref >60)
Glucose, Bld: 99 mg/dL (ref 70–99)
Potassium: 5.2 mmol/L — ABNORMAL HIGH (ref 3.5–5.1)
Sodium: 142 mmol/L (ref 135–145)

## 2021-06-20 NOTE — ED Provider Notes (Signed)
Emergency Medicine Provider Triage Evaluation Note  Natasha Long , a 25 y.o. female  was evaluated in triage.  Pt complains of headache, needing dialysis.  ESRD patient.  Missed her last 3 sessions, last treatment 11 days ago.  Had an appointment to go to dialysis however they stated she needed to be seen in the emergency department to make sure she was "stable enough" for outpatient dialysis.  Does get frequent HA when misses dialysis.  She still makes urine.  Mild cough.  No sudden onset thunderclap headache, unilateral weakness, numbness  Review of Systems  Positive: Headache, needs dialysis Negative: Chest pain, shortness of breath, abdominal pain, numbness, weakness  Physical Exam  BP (!) 150/80 (BP Location: Right Arm)   Pulse 81   Temp 98.3 F (36.8 C) (Oral)   Resp 16   Ht '5\' 8"'$  (1.727 m)   Wt 115.7 kg   SpO2 100%   BMI 38.77 kg/m  Gen:   Awake, no distress   Resp:  Normal effort  MSK:   Moves extremities without difficulty  Neuro:  CN 2-12 grossly intact Other:    Medical Decision Making  Medically screening exam initiated at 4:24 PM.  Appropriate orders placed.  Natasha Long was informed that the remainder of the evaluation will be completed by another provider, this initial triage assessment does not replace that evaluation, and the importance of remaining in the ED until their evaluation is complete.  Headache Needs dialysis  Vital signs stable, work-up started   Natasha Long A, PA-C 06/20/21 1626    Regan Lemming, MD 06/21/21 1442

## 2021-06-20 NOTE — ED Notes (Signed)
Pt had left the building and has now returned

## 2021-06-20 NOTE — ED Triage Notes (Signed)
Patient c/o headache and dizziness that started today.  Patient had her last dialysis treatment 11 days. Patient states that her appointments were scheduled at 0720 and patient states she lived 45 minutes away and could never get to her appointment on time.

## 2021-06-21 NOTE — ED Notes (Signed)
Pt has left it is unknown if she intends to return

## 2021-06-24 ENCOUNTER — Other Ambulatory Visit: Payer: Self-pay

## 2021-06-24 ENCOUNTER — Ambulatory Visit: Payer: Medicaid Other | Attending: Critical Care Medicine | Admitting: Critical Care Medicine

## 2021-06-24 DIAGNOSIS — Z5329 Procedure and treatment not carried out because of patient's decision for other reasons: Secondary | ICD-10-CM

## 2021-06-24 DIAGNOSIS — Z91199 Patient's noncompliance with other medical treatment and regimen due to unspecified reason: Secondary | ICD-10-CM

## 2021-06-24 NOTE — Progress Notes (Signed)
This pt was a no show

## 2021-06-26 ENCOUNTER — Other Ambulatory Visit: Payer: Self-pay | Admitting: Critical Care Medicine

## 2021-07-01 ENCOUNTER — Other Ambulatory Visit: Payer: Self-pay | Admitting: Critical Care Medicine

## 2021-07-01 NOTE — Telephone Encounter (Signed)
dc'd 02/29/20 Dr. Joya Gaskins

## 2021-07-27 ENCOUNTER — Emergency Department (HOSPITAL_COMMUNITY)
Admission: EM | Admit: 2021-07-27 | Discharge: 2021-07-28 | Discharge status: Against Medical Advice | Payer: Medicaid Other | Attending: Emergency Medicine | Admitting: Emergency Medicine

## 2021-07-27 DIAGNOSIS — Z992 Dependence on renal dialysis: Secondary | ICD-10-CM | POA: Insufficient documentation

## 2021-07-27 DIAGNOSIS — X58XXXA Exposure to other specified factors, initial encounter: Secondary | ICD-10-CM | POA: Insufficient documentation

## 2021-07-27 DIAGNOSIS — X838XXA Intentional self-harm by other specified means, initial encounter: Secondary | ICD-10-CM

## 2021-07-27 DIAGNOSIS — S59912A Unspecified injury of left forearm, initial encounter: Secondary | ICD-10-CM | POA: Diagnosis present

## 2021-07-27 DIAGNOSIS — I120 Hypertensive chronic kidney disease with stage 5 chronic kidney disease or end stage renal disease: Secondary | ICD-10-CM | POA: Diagnosis not present

## 2021-07-27 DIAGNOSIS — Y900 Blood alcohol level of less than 20 mg/100 ml: Secondary | ICD-10-CM | POA: Diagnosis not present

## 2021-07-27 DIAGNOSIS — R45851 Suicidal ideations: Secondary | ICD-10-CM | POA: Insufficient documentation

## 2021-07-27 DIAGNOSIS — Z20822 Contact with and (suspected) exposure to covid-19: Secondary | ICD-10-CM | POA: Diagnosis not present

## 2021-07-27 DIAGNOSIS — S41112A Laceration without foreign body of left upper arm, initial encounter: Secondary | ICD-10-CM

## 2021-07-27 DIAGNOSIS — Z046 Encounter for general psychiatric examination, requested by authority: Secondary | ICD-10-CM | POA: Insufficient documentation

## 2021-07-27 DIAGNOSIS — Z23 Encounter for immunization: Secondary | ICD-10-CM | POA: Diagnosis not present

## 2021-07-27 DIAGNOSIS — Z79899 Other long term (current) drug therapy: Secondary | ICD-10-CM | POA: Insufficient documentation

## 2021-07-27 DIAGNOSIS — F17210 Nicotine dependence, cigarettes, uncomplicated: Secondary | ICD-10-CM | POA: Diagnosis not present

## 2021-07-27 DIAGNOSIS — J45909 Unspecified asthma, uncomplicated: Secondary | ICD-10-CM | POA: Diagnosis not present

## 2021-07-27 DIAGNOSIS — S51812A Laceration without foreign body of left forearm, initial encounter: Secondary | ICD-10-CM | POA: Insufficient documentation

## 2021-07-27 DIAGNOSIS — N186 End stage renal disease: Secondary | ICD-10-CM | POA: Insufficient documentation

## 2021-07-27 LAB — CBC
HCT: 35 % — ABNORMAL LOW (ref 36.0–46.0)
Hemoglobin: 10.7 g/dL — ABNORMAL LOW (ref 12.0–15.0)
MCH: 30.3 pg (ref 26.0–34.0)
MCHC: 30.6 g/dL (ref 30.0–36.0)
MCV: 99.2 fL (ref 80.0–100.0)
Platelets: 245 10*3/uL (ref 150–400)
RBC: 3.53 MIL/uL — ABNORMAL LOW (ref 3.87–5.11)
RDW: 15.8 % — ABNORMAL HIGH (ref 11.5–15.5)
WBC: 9.4 10*3/uL (ref 4.0–10.5)
nRBC: 0 % (ref 0.0–0.2)

## 2021-07-27 LAB — COMPREHENSIVE METABOLIC PANEL
ALT: 11 U/L (ref 0–44)
AST: 13 U/L — ABNORMAL LOW (ref 15–41)
Albumin: 3.8 g/dL (ref 3.5–5.0)
Alkaline Phosphatase: 72 U/L (ref 38–126)
Anion gap: 12 (ref 5–15)
BUN: 60 mg/dL — ABNORMAL HIGH (ref 6–20)
CO2: 25 mmol/L (ref 22–32)
Calcium: 9 mg/dL (ref 8.9–10.3)
Chloride: 99 mmol/L (ref 98–111)
Creatinine, Ser: 15.11 mg/dL — ABNORMAL HIGH (ref 0.44–1.00)
GFR, Estimated: 3 mL/min — ABNORMAL LOW (ref >60)
Glucose, Bld: 93 mg/dL (ref 70–99)
Potassium: 6.2 mmol/L — ABNORMAL HIGH (ref 3.5–5.1)
Sodium: 136 mmol/L (ref 135–145)
Total Bilirubin: 0.9 mg/dL (ref 0.3–1.2)
Total Protein: 7.8 g/dL (ref 6.5–8.1)

## 2021-07-27 LAB — RESP PANEL BY RT-PCR (FLU A&B, COVID) ARPGX2
Influenza A by PCR: NEGATIVE
Influenza B by PCR: NEGATIVE
SARS Coronavirus 2 by RT PCR: NEGATIVE

## 2021-07-27 LAB — I-STAT BETA HCG BLOOD, ED (MC, WL, AP ONLY): I-stat hCG, quantitative: <5 m[IU]/mL (ref <5)

## 2021-07-27 LAB — SALICYLATE LEVEL: Salicylate Lvl: <7 mg/dL — ABNORMAL LOW (ref 7.0–30.0)

## 2021-07-27 LAB — ETHANOL: Alcohol, Ethyl (B): <10 mg/dL (ref <10)

## 2021-07-27 LAB — ACETAMINOPHEN LEVEL: Acetaminophen (Tylenol), Serum: <10 ug/mL — ABNORMAL LOW (ref 10–30)

## 2021-07-27 MED ORDER — TETANUS-DIPHTH-ACELL PERTUSSIS 5-2.5-18.5 LF-MCG/0.5 IM SUSY
0.5000 mL | PREFILLED_SYRINGE | Freq: Once | INTRAMUSCULAR | Status: AC
  Administered 2021-07-27: 0.5 mL via INTRAMUSCULAR
  Filled 2021-07-27: qty 0.5

## 2021-07-27 MED ORDER — CHLORHEXIDINE GLUCONATE CLOTH 2 % EX PADS: 6.0000 | MEDICATED_PAD | Freq: Every day | CUTANEOUS | Status: DC

## 2021-07-27 MED ORDER — BACITRACIN ZINC 500 UNIT/GM EX OINT
TOPICAL_OINTMENT | Freq: Two times a day (BID) | CUTANEOUS | Status: DC
  Administered 2021-07-27: 1 via TOPICAL
  Filled 2021-07-27 (×2): qty 0.9

## 2021-07-27 MED ORDER — LIDOCAINE-EPINEPHRINE (PF) 2 %-1:200000 IJ SOLN
20.0000 mL | Freq: Once | INTRAMUSCULAR | Status: AC
  Administered 2021-07-27: 20 mL
  Filled 2021-07-27: qty 20

## 2021-07-27 MED ORDER — ZIPRASIDONE MESYLATE 20 MG IM SOLR
10.0000 mg | Freq: Once | INTRAMUSCULAR | Status: AC
  Administered 2021-07-27: 10 mg via INTRAMUSCULAR
  Filled 2021-07-27: qty 20

## 2021-07-27 MED ORDER — LORAZEPAM 2 MG/ML IJ SOLN
2.0000 mg | Freq: Once | INTRAMUSCULAR | Status: AC
  Administered 2021-07-27: 2 mg via INTRAMUSCULAR
  Filled 2021-07-27: qty 1

## 2021-07-27 MED ORDER — SODIUM ZIRCONIUM CYCLOSILICATE 10 G PO PACK
10.0000 g | PACK | Freq: Once | ORAL | Status: AC
  Administered 2021-07-27: 10 g via ORAL
  Filled 2021-07-27: qty 1

## 2021-07-27 MED ORDER — HALOPERIDOL LACTATE 5 MG/ML IJ SOLN
5.0000 mg | Freq: Once | INTRAMUSCULAR | Status: AC
  Administered 2021-07-27: 5 mg via INTRAMUSCULAR
  Filled 2021-07-27: qty 1

## 2021-07-27 NOTE — ED Triage Notes (Signed)
Pt here from a  "group home " ?  Dialysis pt with a suicide attempt , pt has two lacs to the left F/A  bleeding controlled

## 2021-07-27 NOTE — ED Notes (Signed)
RN called staffing for sitter. No sitter is available right now.

## 2021-07-27 NOTE — ED Notes (Signed)
PA D/C'ed restraint order. RN took off restraints.

## 2021-07-27 NOTE — [facility] Counselor (Signed)
Requested cart.

## 2021-07-27 NOTE — ED Notes (Signed)
Pt is asleep at this time. Pt has pulses in all 4 extremities. Restraints on at this time.
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
## 2021-07-27 NOTE — ED Notes (Signed)
Pt refuse vitals  
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
## 2021-07-27 NOTE — Progress Notes (Signed)
[facility] BRIEF PROGRESS NOTE  Marylon Yonke [id number] [birth year]  ESRD patient on dialysis TTS currently held in the ED under IVC due to suicide attempt.  History of non compliance with dialysis, completed last treatment on 8/25. Labs with K 6.2, BUN 60, CO2 25, and SCr 15.11.  Patient seen and examined in the ED earlier today but unable to contribute due to being sedated.  Trace LE edema with lungs clear to auscultation anterolaterally and oxygenating well on room air.  No acute indications for emergent/urgent HD today.  Will write orders for HD tomorrow 1st shift.  Give dose lokelma and recheck K later today.  Will continue to follow along with patient for dialysis needs while here.   OP HD orders:  TTS - [facility] 3.5hrs 350/AF 1.5 EDW 114.5kg 2K 2Ca P2 LU AVF Hep 5000 Venofer '50mg'$  qwk Mircera 56mg q2wks - last 8/25 Calcitriol 1.225m qHD  LiJen MowPA-C [facility] Pager: [phone]

## 2021-07-27 NOTE — ED Provider Notes (Signed)
I provided a substantive portion of the care of this patient.  I personally performed the entirety of the medical decision making for this encounter.     25 year old female here presents with suicidal ideations.  History of dialysis.  Patient combative here.  Not following commands.  Required sedation.  Will consult TTS when able to   Lacretia Leigh, MD 07/27/21 [phone]

## 2021-07-27 NOTE — BHH Counselor (Signed)
Per report, Pt is still sedated and unable to be assessed.  Requested staff to contact TTS when Pt is alert.

## 2021-07-28 NOTE — ED Provider Notes (Signed)
Patient has apparently eloped from the emergency department, security is searching the hospital to try to find her.   Delora Fuel, MD [id number] [phone]

## 2021-07-28 NOTE — ED Notes (Addendum)
At approximately 2:40AM I was notified by Raul Del, RN that EVS had seen a patient in purple scrubs walking towards the [facility] [place]. I immediately notified charge RN and security was called and a search for patient was ensued. Thus far efforts to locate the patient have not been successful. MD Roxanne Mins was made aware of missing patient.

## 2021-08-01 ENCOUNTER — Other Ambulatory Visit: Payer: Self-pay

## 2021-08-01 ENCOUNTER — Emergency Department (HOSPITAL_COMMUNITY)
Admission: EM | Admit: 2021-08-01 | Discharge: 2021-08-03 | Disposition: A | Discharge status: Home / Self Care | Payer: Medicaid Other | Attending: Emergency Medicine | Admitting: Emergency Medicine

## 2021-08-01 ENCOUNTER — Ambulatory Visit (HOSPITAL_COMMUNITY)
Admission: EM | Admit: 2021-08-01 | Discharge: 2021-08-01 | Disposition: A | Discharge status: Other Acute Inpatient Hospital | Payer: Medicaid Other | Attending: Family | Admitting: Family

## 2021-08-01 DIAGNOSIS — R45851 Suicidal ideations: Secondary | ICD-10-CM | POA: Diagnosis not present

## 2021-08-01 DIAGNOSIS — N186 End stage renal disease: Secondary | ICD-10-CM | POA: Insufficient documentation

## 2021-08-01 DIAGNOSIS — F17210 Nicotine dependence, cigarettes, uncomplicated: Secondary | ICD-10-CM | POA: Insufficient documentation

## 2021-08-01 DIAGNOSIS — D631 Anemia in chronic kidney disease: Secondary | ICD-10-CM | POA: Diagnosis not present

## 2021-08-01 DIAGNOSIS — F4310 Post-traumatic stress disorder, unspecified: Secondary | ICD-10-CM | POA: Insufficient documentation

## 2021-08-01 DIAGNOSIS — Z79899 Other long term (current) drug therapy: Secondary | ICD-10-CM | POA: Diagnosis not present

## 2021-08-01 DIAGNOSIS — I120 Hypertensive chronic kidney disease with stage 5 chronic kidney disease or end stage renal disease: Secondary | ICD-10-CM | POA: Insufficient documentation

## 2021-08-01 DIAGNOSIS — J45909 Unspecified asthma, uncomplicated: Secondary | ICD-10-CM | POA: Diagnosis not present

## 2021-08-01 DIAGNOSIS — Z992 Dependence on renal dialysis: Secondary | ICD-10-CM | POA: Insufficient documentation

## 2021-08-01 DIAGNOSIS — Z046 Encounter for general psychiatric examination, requested by authority: Secondary | ICD-10-CM | POA: Diagnosis present

## 2021-08-01 DIAGNOSIS — F3131 Bipolar disorder, current episode depressed, mild: Secondary | ICD-10-CM | POA: Diagnosis not present

## 2021-08-01 DIAGNOSIS — Z9152 Personal history of nonsuicidal self-harm: Secondary | ICD-10-CM | POA: Insufficient documentation

## 2021-08-01 DIAGNOSIS — Z20822 Contact with and (suspected) exposure to covid-19: Secondary | ICD-10-CM | POA: Diagnosis not present

## 2021-08-01 DIAGNOSIS — F603 Borderline personality disorder: Secondary | ICD-10-CM | POA: Diagnosis present

## 2021-08-01 DIAGNOSIS — F1220 Cannabis dependence, uncomplicated: Secondary | ICD-10-CM | POA: Diagnosis present

## 2021-08-01 DIAGNOSIS — F332 Major depressive disorder, recurrent severe without psychotic features: Secondary | ICD-10-CM | POA: Diagnosis present

## 2021-08-01 LAB — COMPREHENSIVE METABOLIC PANEL
ALT: 10 U/L (ref 0–44)
AST: 16 U/L (ref 15–41)
Albumin: 3.8 g/dL (ref 3.5–5.0)
Alkaline Phosphatase: 64 U/L (ref 38–126)
Anion gap: 18 — ABNORMAL HIGH (ref 5–15)
BUN: 53 mg/dL — ABNORMAL HIGH (ref 6–20)
CO2: 22 mmol/L (ref 22–32)
Calcium: 8.9 mg/dL (ref 8.9–10.3)
Chloride: 99 mmol/L (ref 98–111)
Creatinine, Ser: 12.51 mg/dL — ABNORMAL HIGH (ref 0.44–1.00)
GFR, Estimated: 4 mL/min — ABNORMAL LOW (ref >60)
Glucose, Bld: 79 mg/dL (ref 70–99)
Potassium: 4.7 mmol/L (ref 3.5–5.1)
Sodium: 139 mmol/L (ref 135–145)
Total Bilirubin: 0.6 mg/dL (ref 0.3–1.2)
Total Protein: 7.7 g/dL (ref 6.5–8.1)

## 2021-08-01 LAB — CBC WITH DIFFERENTIAL/PLATELET
Abs Immature Granulocytes: 0.02 10*3/uL (ref 0.00–0.07)
Basophils Absolute: 0 10*3/uL (ref 0.0–0.1)
Basophils Relative: 0 %
Eosinophils Absolute: 0.2 10*3/uL (ref 0.0–0.5)
Eosinophils Relative: 2 %
HCT: 36 % (ref 36.0–46.0)
Hemoglobin: 11.5 g/dL — ABNORMAL LOW (ref 12.0–15.0)
Immature Granulocytes: 0 %
Lymphocytes Relative: 26 %
Lymphs Abs: 2.6 10*3/uL (ref 0.7–4.0)
MCH: 31.4 pg (ref 26.0–34.0)
MCHC: 31.9 g/dL (ref 30.0–36.0)
MCV: 98.4 fL (ref 80.0–100.0)
Monocytes Absolute: 0.6 10*3/uL (ref 0.1–1.0)
Monocytes Relative: 6 %
Neutro Abs: 6.7 10*3/uL (ref 1.7–7.7)
Neutrophils Relative %: 66 %
Platelets: 250 10*3/uL (ref 150–400)
RBC: 3.66 MIL/uL — ABNORMAL LOW (ref 3.87–5.11)
RDW: 15.9 % — ABNORMAL HIGH (ref 11.5–15.5)
WBC: 10.2 10*3/uL (ref 4.0–10.5)
nRBC: 0 % (ref 0.0–0.2)

## 2021-08-01 LAB — I-STAT BETA HCG BLOOD, ED (MC, WL, AP ONLY): I-stat hCG, quantitative: <5 m[IU]/mL (ref <5)

## 2021-08-01 LAB — SALICYLATE LEVEL: Salicylate Lvl: <7 mg/dL — ABNORMAL LOW (ref 7.0–30.0)

## 2021-08-01 LAB — RESP PANEL BY RT-PCR (FLU A&B, COVID) ARPGX2
Influenza A by PCR: NEGATIVE
Influenza B by PCR: NEGATIVE
SARS Coronavirus 2 by RT PCR: NEGATIVE

## 2021-08-01 LAB — ACETAMINOPHEN LEVEL: Acetaminophen (Tylenol), Serum: <10 ug/mL — ABNORMAL LOW (ref 10–30)

## 2021-08-01 MED ORDER — OLANZAPINE 10 MG IM SOLR
10.0000 mg | Freq: Once | INTRAMUSCULAR | Status: DC | PRN
  Administered 2021-08-01: 10 mg via INTRAMUSCULAR
  Filled 2021-08-01: qty 10

## 2021-08-01 MED ORDER — ZIPRASIDONE MESYLATE 20 MG IM SOLR: 20.0000 mg | Freq: Once | INTRAMUSCULAR | Status: AC

## 2021-08-01 MED ORDER — ZIPRASIDONE MESYLATE 20 MG IM SOLR
INTRAMUSCULAR | Status: AC
  Administered 2021-08-01: 20 mg via INTRAMUSCULAR
  Filled 2021-08-01: qty 20

## 2021-08-01 MED ORDER — HALOPERIDOL LACTATE 5 MG/ML IJ SOLN
5.0000 mg | Freq: Once | INTRAMUSCULAR | Status: AC
  Administered 2021-08-01: 5 mg via INTRAMUSCULAR
  Filled 2021-08-01: qty 1

## 2021-08-01 MED ORDER — LORAZEPAM 2 MG/ML IJ SOLN
2.0000 mg | Freq: Once | INTRAMUSCULAR | Status: AC
  Administered 2021-08-01: 2 mg via INTRAMUSCULAR
  Filled 2021-08-01: qty 1

## 2021-08-01 NOTE — ED Notes (Signed)
IV team at bedside 

## 2021-08-01 NOTE — Progress Notes (Signed)
CSW received a phone call from [facility] [phone] and spoke with Vicente Males. CSW was informed that pt has been accepted at [facility] with the following accepting information: [address] unit; accepting physician is Dr. Kathlene Cote. The phone number for report is [phone]. Pt can admit tonight.  Fax IVC and behavioral health note [phone]. CSW agreed to fax the behavioral health note. CSW communicated with Chrisandra Netters, RN about pt's acceptance via secure chat.   Nadara Mode, [place] 08/01/2021 @ 8:53 PM

## 2021-08-01 NOTE — ED Triage Notes (Signed)
Pt bib [employer] from [facility] to ge a psych eval.

## 2021-08-01 NOTE — ED Notes (Signed)
Notified EDP about hard stick. Notified IV team

## 2021-08-01 NOTE — ED Notes (Signed)
Attempted to straight stick 2x by phlebotomist no success.

## 2021-08-01 NOTE — ED Notes (Signed)
Patient refusing IV/blood draw. IV RN and ED provider are at bedside talking with patient.

## 2021-08-01 NOTE — ED Notes (Signed)
Unable to get patient blood. 

## 2021-08-01 NOTE — ED Notes (Signed)
I tried to give report to [place] at [facility], but she wasn't aware that pt was on dialysis and they don't take pt's like that. I messaged the SW again to make them aware. Waiting for response.

## 2021-08-01 NOTE — Progress Notes (Signed)
Inpatient Behavioral Health Placement  Pt meets criteria for [facility] inpatient per the recommendation of Darrol Angel, NP. CSW spoke with [facility] AV, Teressa Senter, RN and there currently are no appropriate beds at [facility]. CSW sent out referral.  CSW sent referral out to the following:   Destination Service Provider Address Phone Fax  [facility]  [address] [place] [address] [phone] [facility]  [place], [place] [address] [phone]  [facility]  [address], [place] [address] [phone] [place], [place] [address] [phone]  [facility]  [address] [place] [address] [phone] [place]  [address]  [address] [phone] [facility]  [address], [place] [address] [phone]   [facility]  [address] [place] [address] [phone]  [address] [place] [address] [phone] [address] [place] [address] [phone]  [facility]  [phone] [address] [place] [address] [phone]   Situation is on going; CSW will assist and follow pt with potential bed offers.   Benjaman Kindler, MSW, [facility] 08/01/2021 8:15 PM

## 2021-08-01 NOTE — ED Notes (Signed)
Attempted to call report, but nobody answered.

## 2021-08-01 NOTE — ED Notes (Signed)
Pt was accepted to [facility]. Accepting is Dr. Kathlene Cote. Number is [phone].
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
## 2021-08-01 NOTE — ED Notes (Signed)
Pt was allowed to call her spouse and speak with the provider.

## 2021-08-01 NOTE — ED Notes (Signed)
[facility] will not be taking pt due to need for dialysis.
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
## 2021-08-01 NOTE — ED Notes (Signed)
Pt agitated and raising voice with staff and becoming verbally aggressive w/ primary RN

## 2021-08-01 NOTE — ED Notes (Signed)
IVC paperwork faxed to [facility]. Shelton Silvas faxed [place] note to them.

## 2021-08-01 NOTE — ED Notes (Signed)
Natasha Long, SO on file, taking pt's keys home with him because they're for his car.

## 2021-08-01 NOTE — ED Notes (Addendum)
We were able to get pt back to her bed. Pt at first was Development worker, community, but is now laying on her L side. Pt laying there calmly. Restraints had been ordered because she was still fighting, but since she's laying there calmly and not being aggressive or violent, I didn't put them on. Dr. Stark Jock made aware.

## 2021-08-01 NOTE — Progress Notes (Signed)
This nurse arrived to room notified patient of procedure. When ready to place PIV. Patient sat up in bed and flat out refused to have a PIV placed at this time. Nurse notified. Instructed to reconsult if patient is agreeable. Fran Lowes RN VAST
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
## 2021-08-01 NOTE — ED Provider Notes (Signed)
Behavioral Health Urgent Care Medical Screening Exam  Patient Name: Natasha Long MRN: [id number] Date of Evaluation: 08/01/21 Chief Complaint:   Diagnosis:  Final diagnoses:  PTSD (post-traumatic stress disorder)    History of Present illness: Natasha Long is a 25 y.o. female.  Patient presents voluntarily to [facility] behavioral health for walk-in assessment.  She states "I just want to get my medications, my anxiety is through the roof and if I do not get my medications I will kill myself."  Patient has been diagnosed with bipolar disorder, major depressive disorder, PTSD and borderline personality disorder.  She reports she is not currently followed by outpatient psychiatry but seeking outpatient psychiatry follow-up. Not currently prescribed any medications. She reports the only medication that has worked well in the past is Abilify.  She would like to be restarted on Abilify Maintena long-acting injectable.  Additionally she requests "medication for my anxiety, BuSpar does not work, Charity fundraiser does not work, I need something for my anxiety."  She reports recent stressors include homelessness x9 years.  She reports she resides "in a box truck on the property of a friend."  She states "it is uncomfortable to live this way."  Additional stressors include her 77-year-old son who resides with his paternal aunt in [place], [place].  She would like to "get a place so I can get my son back."  She endorses suicidal ideation with a plan to cut her wrist.  She states "if I do not get what I want I will cut my wrist as you can see."  Patient then raises sleeve of sweater to reveal two partially healed, self-inflicted cuts, closed with staples.  Staples remain intact, no redness no drainage no inflammation noted.  Druciella becomes frustrated when Probation officer discusses inpatient psychiatric treatment.  She states "I am on dialysis 3 times a week, nobody will take me."  She reports last dialyzed  on yesterday.  Patient request to "smoke a cigarette", explained smoking is not permitted at this facility. Druciella next requested to speak with security.  She then became labile and physically aggressive.  She screamed repeatedly "let me out of here, I do not want to be here."  She then attempted to open several locked doors, finally exiting to hallway.  She continued to be verbally aggressive and threatening as well as physically aggressive towards staff and police officer.  IVC petition initiated by this Probation officer.  Patient was then restrained and placed in restraint chair.  Olanzapine 10 mg IM once administered/severe agitation.  Patient offered support and encouragement.  [place] continues to present with verbal and physical aggression while in restraint chair, ultimately removing herself from restraint chair.   Continues to yell, threaten and curse at staff.  She states "I lied about everything, I am not suicidal, I can keep myself safe, let me leave." Druciella gives verbal consent to speak with her boyfriend of the home Ludger Nutting phone number [phone] or [phone].  This Probation officer spoke with patient's boyfriend who verbalizes understanding of treatment plan.  Psychiatric Specialty Exam  Presentation  General Appearance:Appropriate for Environment; Casual  Eye Contact:Fair  Speech:Clear and Coherent; Normal Rate  Speech Volume:Normal  Handedness:Right   Mood and Affect  Mood:Depressed  Affect:Depressed   Thought Process  Thought Processes:Coherent; Goal Directed; Linear  Descriptions of Associations:Intact  Orientation:Full (Time, Place and Person)  Thought Content:Logical  Diagnosis of Schizophrenia or Schizoaffective disorder in past: No   Hallucinations:None  Ideas of Reference:None  Suicidal Thoughts:Yes, Active With  Intent; With Plan; With Means to [place]; With Access to Means  Homicidal Thoughts:No   Sensorium  Memory:Immediate Good;  Recent Good; Remote Good  Judgment:Intact  Insight:Lacking   Executive Functions  Concentration:Good  Attention Span:Good  [name]  Language:Good   Psychomotor Activity  Psychomotor Activity:Normal   Assets  Assets:Communication Skills; Desire for Improvement; Financial Resources/Insurance; Intimacy; Leisure Time; Physical Health; Resilience; Social Support   Sleep  Sleep:Fair  Number of hours:  No data recorded  Nutritional Assessment (For OBS and FBC admissions only) Has the patient had a weight loss or gain of 10 pounds or more in the last 3 months?: No Has the patient had a decrease in food intake/or appetite?: No Does the patient have dental problems?: No Does the patient have eating habits or behaviors that may be indicators of an eating disorder including binging or inducing vomiting?: No Has the patient recently lost weight without trying?: No Has the patient been eating poorly because of a decreased appetite?: No Malnutrition Screening Tool Score: 0   Physical Exam: Physical Exam Vitals and nursing note reviewed.  Constitutional:      Appearance: Normal appearance. She is well-developed. She is obese.  HENT:     Head: Normocephalic.     Nose: Nose normal.  Cardiovascular:     Rate and Rhythm: Normal rate.  Pulmonary:     Effort: Pulmonary effort is normal.  Musculoskeletal:        General: Normal range of motion.     Cervical back: Normal range of motion.  Skin:    General: Skin is warm and dry.  Neurological:     Mental Status: She is alert and oriented to person, place, and time.  Psychiatric:        Attention and Perception: Attention and perception normal.        Mood and Affect: Mood is anxious. Affect is labile and angry.        Speech: Speech is rapid and pressured.        Behavior: Behavior is uncooperative.   Review of Systems  Psychiatric/Behavioral:  Positive for depression and suicidal ideas. The  patient is nervous/anxious.   Blood pressure 137/82, pulse 65, temperature 98.8 F (37.1 C), temperature source Oral, resp. rate 16, SpO2 100 %. There is no height or weight on file to calculate BMI.  Musculoskeletal: Strength & Muscle Tone: within normal limits Gait & Station: normal Patient leans: N/A   [place] MSE Discharge Disposition for Follow up and Recommendations: Based on my evaluation I certify that psychiatric inpatient services furnished can reasonably be expected to improve the patient's condition which I recommend transfer to an appropriate accepting facility.  Patient reviewed with Dr. Serafina Mitchell. Inpatient psychiatric treatment recommended. She will be transferred to [facility] emergency department by law enforcement to await inpatient psychiatric treatment.  Accepted by Dr. Joya Gaskins.  Patient currently under for involuntary commitment, petitioned by this Probation officer.    Lucky Rathke, FNP 08/01/2021, 10:20 AM

## 2021-08-01 NOTE — ED Provider Notes (Signed)
I spoke with Natasha Stallion, NP from [facility] behavioral health.  Natasha Long will be transferred to [facility] for further psychiatric evaluation.  She has a past medical history of end-stage renal disease and is on hemodialysis.  She was aggressive at the behavioral Holly and required 10 mg of olanzapine.  She was here on [id number] for self-inflicted injury and eloped.  She is now under involuntary commitment orders.  Police will transport her to the ED.   Arnaldo Natal, MD 08/01/21 1032

## 2021-08-01 NOTE — ED Notes (Signed)
Pt began to yell and curse at staff stating "I want to leave, let me out of here". Staff attempted to redirect and verbally de-escalate pt but was unsuccessful. Pt began to repeatedly run into the door in an attempt to elope. Pt began to break badge scanner off of the wall and physically harm herself by slamming her body into the wall/door. STARR was initiated in order to maintain patient and staff safety. Pt was placed in a therapeutic hold and carefully placed into the restraint chair. Pt began to spit on staff and attempted to get out of the chair. Pt bit her lip and began to spit blood on the staff. Staff attempted to give patient a mask but was unsuccessful. Vital signs were completed on patient and respirations are being monitored. Will continue to monitor for safety.

## 2021-08-01 NOTE — ED Notes (Signed)
TTS at the bedside. Physician should be on within 30 minutes.

## 2021-08-01 NOTE — ED Notes (Signed)
Two failed attempts to collect labs as patient grabbed needle and pulled it out of her arm during blood draw

## 2021-08-01 NOTE — ED Notes (Signed)
Pt laying quietly on L side still. Sitter remains.

## 2021-08-01 NOTE — Progress Notes (Signed)
Inpatient Behavioral Health Placement:  CSW received a phone call from [facility], [phone] reporting that pt is unable to be accepted due to pt's dialysis schedule. Vicente Males advised that [facility] can not meet pt's needs at this time. PT HAS BEEN DENIED AT [facility] per Dr. Alethia Berthold.  AND AT [facility]. [facility] has no appropriate beds. CSW will continue to seek bed placement for pt. Pt has been referred out.    Benjaman Kindler, MSW, [facility] 08/01/2021 9:47 PM

## 2021-08-01 NOTE — ED Notes (Signed)
Security remains at bedside. Pt still alert and agitated. Security still at bedside.

## 2021-08-01 NOTE — ED Notes (Signed)
Pt continues to sleep. NAD noted. 

## 2021-08-01 NOTE — Progress Notes (Signed)
1015 Patient out in hallway asking staff if she can leave.  Explained that MD would be in to talk with her shortly.  Patient stated she didn't want to talk to the doctor.  Stated she wanted to leave.  Patient down to doors and asked that door be opened. Attempted to redirect patient away from door.  Patient began pounding and kicking doors.  Additional staff called.  Patient able to move through one door and out into hallway.  Unable to call or redirect patient.  Patient placed in restraint chair and Zypreza 10 mg given IM in Left thigh.  Restraints applied. Restraints released at 1045.  Patient ambulating in hallway.  Given sandwich and water.  [employer] arrived.  Patient taken in chair to sally port.  Patient cooperative and calm.  Discharged in stable condition; no acute distress noted.

## 2021-08-01 NOTE — ED Notes (Signed)
Pt still out from haldol and benadryl. Moved into room 52. No sitter present. NAD noted. Will not wake pt up because she was combative prior to the medication.

## 2021-08-01 NOTE — ED Notes (Signed)
Pt released from restraints and allowed to use the restroom. Pt was offered water but refused.

## 2021-08-01 NOTE — ED Notes (Signed)
waiting for IV team for IV placement and blood draw.

## 2021-08-02 LAB — HEPATITIS B SURFACE ANTIGEN: Hepatitis B Surface Ag: NONREACTIVE

## 2021-08-02 LAB — HEPATITIS B CORE ANTIBODY, TOTAL: Hep B Core Total Ab: NONREACTIVE

## 2021-08-02 LAB — HEPATITIS B SURFACE ANTIBODY,QUALITATIVE: Hep B S Ab: NONREACTIVE

## 2021-08-02 MED ORDER — HEPARIN SODIUM (PORCINE) 1000 UNIT/ML DIALYSIS
2000.0000 [IU] | INTRAMUSCULAR | Status: DC | PRN
  Filled 2021-08-02: qty 2

## 2021-08-02 MED ORDER — ZIPRASIDONE MESYLATE 20 MG IM SOLR
20.0000 mg | Freq: Once | INTRAMUSCULAR | Status: AC
  Administered 2021-08-02: 20 mg via INTRAMUSCULAR
  Filled 2021-08-02 (×2): qty 20

## 2021-08-02 MED ORDER — CHLORHEXIDINE GLUCONATE CLOTH 2 % EX PADS: 6.0000 | MEDICATED_PAD | Freq: Every day | CUTANEOUS | Status: DC

## 2021-08-02 MED ORDER — LIDOCAINE HCL (PF) 1 % IJ SOLN
5.0000 mL | INTRAMUSCULAR | Status: DC | PRN
  Filled 2021-08-02: qty 5

## 2021-08-02 MED ORDER — SODIUM CHLORIDE 0.9 % IV SOLN: 100.0000 mL | INTRAVENOUS | Status: DC | PRN

## 2021-08-02 MED ORDER — LIDOCAINE-PRILOCAINE 2.5-2.5 % EX CREA
1.0000 "application " | TOPICAL_CREAM | CUTANEOUS | Status: DC | PRN
  Filled 2021-08-02: qty 5

## 2021-08-02 MED ORDER — PENTAFLUOROPROP-TETRAFLUOROETH EX AERO
1.0000 "application " | INHALATION_SPRAY | CUTANEOUS | Status: DC | PRN
  Filled 2021-08-02: qty 116

## 2021-08-02 MED ORDER — STERILE WATER FOR INJECTION IJ SOLN
INTRAMUSCULAR | Status: AC
  Filled 2021-08-02: qty 10

## 2021-08-02 MED ORDER — HEPARIN SODIUM (PORCINE) 1000 UNIT/ML DIALYSIS
1000.0000 [IU] | INTRAMUSCULAR | Status: DC | PRN
  Filled 2021-08-02: qty 1

## 2021-08-02 NOTE — ED Notes (Addendum)
Provider at bedside at this time speaking with pt

## 2021-08-02 NOTE — Progress Notes (Signed)
I was present at this dialysis session. I have reviewed the session itself and made appropriate changes.   Sitter at bedside. Tolerating HD. UFG 2L  There were no vitals filed for this visit.  Recent Labs  Lab 08/01/21 1942  NA 139  K 4.7  CL 99  CO2 22  GLUCOSE 79  BUN 53*  CREATININE 12.51*  CALCIUM 8.9    Recent Labs  Lab 07/27/21 1200 08/01/21 1942  WBC 9.4 10.2  NEUTROABS  --  6.7  HGB 10.7* 11.5*  HCT 35.0* 36.0  MCV 99.2 98.4  PLT 245 250    Scheduled Meds:  Chlorhexidine Gluconate Cloth  6 each Topical Q0600   Continuous Infusions:  sodium chloride     sodium chloride     PRN Meds:.sodium chloride, sodium chloride, heparin, heparin, lidocaine (PF), lidocaine-prilocaine, pentafluoroprop-tetrafluoroeth   Gean Quint, MD [facility] 08/02/2021, 1:59 PM

## 2021-08-02 NOTE — ED Provider Notes (Signed)
Patient has requested to speak with me regarding wanting to go home.  I have reviewed her record and the patient just recently cut her wrists, then was threatening to do it again which resulted in her being placed under IVC.  She is now stating that she wants to go home and is very emotional and angry that we will not let her.  I do not feel as though I am familiar enough with her for her to be discharged.  She tells me she is not suicidal and wants to go home, but has recently sliced her wrist and has threatened recently to do this again.  I have reviewed her record and she has required Geodon on multiple occasions.  I told patient that if upon reassessment by TTS, they feel as though she is appropriate for discharge that I would go along with this.  But that she would have to speak with them first.  She asked me when this might take place and I told her it would happen when they had time.  This made the patient angry, calling me "useless".   Veryl Speak, MD 08/02/21 [phone]

## 2021-08-02 NOTE — ED Notes (Signed)
Pt asking when is she going to be able to talk with behavioral health. Spoke with [facility] David in reference to pt and pt requesting to speak with someone. Advised that he would pass it on to the person that is on call and have them reach out. Pt made aware at this time

## 2021-08-02 NOTE — ED Notes (Signed)
Breakfast Ordered 

## 2021-08-02 NOTE — ED Notes (Signed)
Report given to Linda, RN.

## 2021-08-02 NOTE — ED Notes (Signed)
Pt brought back to room by security and [employer], pt placed in purple scrubs and her clothing was added to her belongings in that are in locker 2. Pt given Geodon, attempted to pull away during administration, but was able to complete administration with security at bedside. Pt is crying and wanting to go home. Explained to pt that she cannot leave at this time and behavior health will speak to her as soon as they are available. Explained to pt that her behavior will help in decision making going forward, Pt states "I am sorry, I am sorry, I am not a bad person. Please don't hate me.". Explained to pt that no one hated her and that we are here to help her. Pt stated "the only person that loved me unconditionally died 2 years ago." Attempted to console pt. Pt requested a hug from this nurse. This nurse felt safe in providing pt with a hug. Pt appeared to calm down. Pt in room at this time and is calm. Spoke with nurse in dialysis who will speak to their charge nurse and try to dialyze pt in ED. Explained to pt plan and pt is in agreement with plan.

## 2021-08-02 NOTE — ED Notes (Signed)
Pt up ambulating out of room stating that she needs to go to the restroom

## 2021-08-02 NOTE — Progress Notes (Addendum)
Nephrology Quick Note  S: Aware that patient is in ED until SI hold. Dialyzes on TTS schedule - last on 9/1. Today is her HD day.  O: Blood pressure (!) 146/89, pulse 65, temperature 98 F (36.7 C), temperature source Oral, resp. rate 18, SpO2 100 %.  HD orders: TTS at [facility] 3:30hr, 350/A1.5, EDW 114.5kg, 2K/2Ca, AVF, heparin 5000 - Venofer '50mg'$  IV q week - Mirera 39mg IV q 2 weeks (last 8/25) - Calcitriol 1.233m PO q HD  A/P: ESRD: Will arrange for HD today per usual TTS schedule. -- placing orders now. Will need sitter to accompany her. BP/volume: BP slightly high - continue OP dry weight for now. Anemia: Hgb > 11, hold off on ESA for now. SI: Per ED and psych providers. Multiple ED visits for the same, ?would she benefit from inpatient psych admission.   Addendum (1:33p): Seen on HD - 2L UFG and tolerating. No dyspnea.  Gen: Well nourished woman, NAD. Room air. CV: RRR; no murmur PULM: CTAB, no rales EXTREM: No LE edema ACCESS: LUE AVF + thrill, staples to L forearm    KaVeneta PentonPA-C [employer]bermaid[employer] [phone]

## 2021-08-02 NOTE — Progress Notes (Signed)
Per [facility], patient meets criteria for inpatient treatment. There are no available or appropriate beds at [facility] today. CSW faxed referrals to the following facilities for review:  [place] [facility] [address] [place] [address] [place] --  [place], [place] [address] [phone] --  [facility]  Pending - Request Sent N/A [address] [place] [address] [phone] --  [place] N/A [place], [place] [address] [phone] --  [place] N/A [address], [place] [address] [phone] --  [facility]  Pending - Request Sent N/A [address] [place] [address] [phone] --  [place] [address] [place] [address] [phone] --  [facility]  Pending - Request Sent N/A [place] [place] [address] [phone] --  [place] [place] [address] [phone] --  [facility]  Pending - Request Sent N/A [address], [place] [address] [phone] --  [place] [phone] [address] [place] [address] [place] --  [facility]  Pending - No Request Sent N/A [address] [place] [address] [phone] --  [place]  Pending - No Request Sent N/A [address], [place] [address] [phone] --  [facility]  Pending - No  Request [facility] [address] [place] [address] [phone] --  [facility]  Pending - No Request Sent N/A [address] [place] [address] [phone] --  [facility]  Pending - No Request Sent N/A [address] [place] [address] [phone] --  [facility]  Pending - No Request Sent N/A [address] [place], [place] [address] [phone] --  [facility]  Pending - No Request Sent N/A [address] [place] [address] [phone] --  [facility]  Pending - No Request Sent N/A [address], [place] [address] [phone] --  [facility]  Pending - No Request Sent N/A [place], [place] [address] [facility]  Pending - No Request Sent N/A [address], [place] [address] [phone] --  [facility]  Pending - No Request Sent N/A [address], [place] [address] [phone] --  [facility]  Pending - No Request Sent N/A [address] [place] [address] [phone] --  [place]  Pending - No Request Sent N/A [address] [place] [address] [place] [address] [phone] --  [facility]  Pending - No Request Sent N/A [address] [place] [address] [phone]-     TTS will continue to seek bed placement.  Glennie Isle, MSW, [place], LCAS-A Phone: [phone] Disposition/TOC

## 2021-08-02 NOTE — ED Notes (Signed)
Received verbal report from [facility] at this time

## 2021-08-02 NOTE — ED Notes (Signed)
Pt up ambulating in hallway away from room initially stating that she needs to go to the restroom. And then ambulating in hallway stating that she needs to speak with the doctor that she needs to leave. Asks if she is still IVC and advised that she is. Upset stating that she asked like 20 hrs ago to be taken off of them so that she can leave. When this RN attempted to speak with pt pt advises that I can't do anything for her she needs to speak with the doctor face to face. Able to get pt back in to her room and is now crying and upset stating that she needs to go home, she wants to go home, she doesn't know what day or time it is that she has dialysis tomorrow she needs to go home. NT went to go speak with provider at this time. Security called due to pt previous aggressive episode and is in the department at this time

## 2021-08-02 NOTE — ED Notes (Signed)
Per NT provider would come see pt

## 2021-08-02 NOTE — ED Notes (Signed)
Pt appears to be sleeping, respirations even and unlabored, sitter remains outside pt room, door is closed but blind are open to visualize pt

## 2021-08-02 NOTE — ED Notes (Signed)
Pt had sat up to eat meal tray and then fell asleep sitting over the meal tray in the bed. Sitter remains at bedside.

## 2021-08-02 NOTE — ED Notes (Signed)
I didn't want to wake pt up since she was violent earlier, but I did check a pulse rate and O2 to make sure the medication didn't affect those. Pt remains asleep.

## 2021-08-02 NOTE — ED Notes (Addendum)
Pt returned to room from restroom. Asking where the provider is and telling everyone to go away and slams the door shut

## 2021-08-02 NOTE — ED Notes (Signed)
Pt requested to make a phone call, Pt at nurses' desk on phone. Pt is very calm and cooperative and pleasant at this time.

## 2021-08-02 NOTE — ED Notes (Signed)
Pt arrived back in room from dialysis. Pt came out to nurses' desk stating "I am really so sorry, I do not like it when I get like that. I forget who I am. I am really so sorry." This nurse reassured pt that there are no hard feelings and that apology and pt are fully accepted. Reassured pt that we are all here to help her and wish only the best for her. Pt returned to room quietly and is resting quietly on stretcher. Pt prefers to have TV off and lights on at this time.

## 2021-08-02 NOTE — ED Notes (Signed)
Pt to be transported to dialysis with sitter. Spoke to pt about dialysis and pt agreed to comply.

## 2021-08-02 NOTE — ED Provider Notes (Signed)
Emergency Medicine Observation Re-evaluation Note  Natasha Long is a 25 y.o. female, seen on rounds today.  Pt initially presented to the ED for complaints of Suicidal Currently, the patient is sleeping.  Physical Exam  BP (!) 146/89 (BP Location: Left Arm)   Pulse 65   Temp 98 F (36.7 C) (Oral)   Resp 18   SpO2 100%  Physical Exam General: Sleeping, respirations even and unlabored Cardiac:  Lungs: Respirations even and unlabored Psych: Sleeping  ED Course / MDM  EKG:EKG Interpretation  Date/Time:  Friday August 01 2021 12:29:50 EDT Ventricular Rate:  78 PR Interval:  128 QRS Duration: 83 QT Interval:  409 QTC Calculation: 466 R Axis:   41 Text Interpretation: Sinus rhythm Ventricular premature complex Low voltage, precordial leads Confirmed by Octaviano Glow [phone]) on 08/01/2021 12:35:36 PM  I have reviewed the labs performed to date as well as medications administered while in observation.  Recent changes in the last 24 hours include agitated overnight requiring further medication.  Plan  Current plan is for placement which is complicated by her dialysis status. Natasha Long is under involuntary commitment.  Discussed with Dr. Candiss Norse with nephrology, patient is not cooperative, requiring sedation for agitation, flight risk but will need dialysis. Nephrology PA will work on a dialysis plan.       Tacy Learn, PA-C 08/02/21 0911    Luna Fuse, MD 08/06/21 [phone]

## 2021-08-02 NOTE — ED Notes (Signed)
This Probation officer was in med room, sitter and NT assisting another pt with shower, pt escaped room without notice. Security, provider, and CN alerted. Actively searching premises for pt at this time.

## 2021-08-02 NOTE — ED Notes (Signed)
Pt sitting up in bed eating meal tray

## 2021-08-02 NOTE — ED Notes (Signed)
Attempted to obtained VS pt initially refused but then allowed for this RN to obtain VS.

## 2021-08-03 ENCOUNTER — Encounter (HOSPITAL_COMMUNITY): Payer: Self-pay | Admitting: Registered Nurse

## 2021-08-03 DIAGNOSIS — F4310 Post-traumatic stress disorder, unspecified: Secondary | ICD-10-CM | POA: Diagnosis not present

## 2021-08-03 DIAGNOSIS — F3131 Bipolar disorder, current episode depressed, mild: Secondary | ICD-10-CM

## 2021-08-03 DIAGNOSIS — F603 Borderline personality disorder: Secondary | ICD-10-CM | POA: Diagnosis not present

## 2021-08-03 MED ORDER — LORAZEPAM 1 MG PO TABS
2.0000 mg | ORAL_TABLET | Freq: Once | ORAL | Status: AC
  Administered 2021-08-03: 2 mg via ORAL
  Filled 2021-08-03: qty 2

## 2021-08-03 MED ORDER — LORAZEPAM 0.5 MG PO TABS
0.5000 mg | ORAL_TABLET | Freq: Once | ORAL | Status: AC | PRN
  Administered 2021-08-03: 0.5 mg via ORAL
  Filled 2021-08-03: qty 1

## 2021-08-03 NOTE — ED Notes (Signed)
Pt requested medication to help her relax. Dr. Sedonia Small made aware and placed an order for 2 mg ativan PO.

## 2021-08-03 NOTE — ED Notes (Signed)
Patient c/o feeling anxious and not being able to stay in her room. Security at bedside. Patient informed I would get her medication after I had finished discharging another patient.

## 2021-08-03 NOTE — ED Notes (Signed)
Patient refused vital signs 

## 2021-08-03 NOTE — ED Provider Notes (Signed)
Emergency Medicine Observation Re-evaluation Note  Natasha Long is a 25 y.o. female, seen on rounds today.  Pt initially presented to the ED for complaints of Suicidal Currently, the patient is tearful, wants to speak with [facility] counselor, able to be verbally redirected and deescalated.  Physical Exam  BP 129/70 (BP Location: Right Arm)   Pulse 79   Temp 98.8 F (37.1 C) (Oral)   Resp 18   Wt 111.8 kg   SpO2 99%   BMI 37.48 kg/m  Physical Exam General: awake, tearful Cardiac:  Lungs: respirations even and unlabored  Psych: agitated   ED Course / MDM  EKG:EKG Interpretation  Date/Time:  Friday August 01 2021 12:29:50 EDT Ventricular Rate:  78 PR Interval:  128 QRS Duration: 83 QT Interval:  409 QTC Calculation: 466 R Axis:   41 Text Interpretation: Sinus rhythm Ventricular premature complex Low voltage, precordial leads Confirmed by Octaviano Glow [id number]) on 08/01/2021 12:35:36 PM  I have reviewed the labs performed to date as well as medications administered while in observation.  Recent changes in the last 24 hours include eloped from the department yesterday, was located and returned to room. Completed dialysis yesterday without complications.   Plan  Current plan is for pending placement for inpatient treatment. Natasha Long is under involuntary commitment.  *will need dialysis again on Tuesday if still in the ER, Dr. Candiss Norse requests nephrology contacted on Monday to arrange for Tuesday dialysis.       Tacy Learn, PA-C 08/03/21 [id number]    Varney Biles, MD 08/03/21 [phone]
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
## 2021-08-03 NOTE — Progress Notes (Signed)
CSW followed up with [facility] in reference to a referral sent for seeking placement. It was reported that the patient is under review.   Glennie Isle, MSW, [place], LCAS-A Phone: [phone] Disposition/TOC

## 2021-08-03 NOTE — Discharge Instructions (Signed)
[facility]-will provide timely access to mental health services for children and adolescents (4-17) and adults presenting in a mental health crisis. The program is designed for those who need urgent Behavioral Health or Substance Use treatment and are not experiencing a medical crisis that would typically require an emergency room visit.    [address] [place], [place] [address] Phone: [phone] [email or web address]   The [facility] will also offer the following outpatient services: (Monday through Friday 8am-5pm)    Partial Hospitalization Program (PHP)  Substance Abuse Intensive Outpatient Program (SA-IOP)  Group Therapy  Medication Management  Peer Living Room   We also provide (24/7):    Assessments: Our mental health clinician and providers will conduct a focused mental health evaluation, assessing for immediate safety concerns and further mental health needs.   Referral: Our team will provide resources and help connect to community based mental health treatment, when indicated, including psychotherapy, psychiatry, and other specialized behavioral health or substance use disorder services (for those not already in treatment).   Transitional Care: Our team providers in person bridging and/or telphonic follow-up during the patient's transition to outpatient services.      

## 2021-08-03 NOTE — ED Notes (Signed)
Patient u p at door speaking with sitter. Patient is calm and cooperative.

## 2021-08-03 NOTE — ED Notes (Signed)
Pt requesting to speak to a doctor because she wants to go home. Pt informed that she is IVC'd and she can not leave at this time. Dr. Sedonia Small made aware that the pt would like to speak to him and he states he will come and see her shortly.

## 2021-08-03 NOTE — ED Notes (Signed)
All pt belongings returned from locker 2 and security

## 2021-08-03 NOTE — ED Notes (Signed)
Patient sitting, calmly speaking with [employer] officer.

## 2021-08-03 NOTE — ED Notes (Signed)
Pt is being TTS

## 2021-08-03 NOTE — Progress Notes (Signed)
Tolerated HD without incident yesterday, net [facility] [id number]. Next HD planned for Tuesday. Please call covering nephrologist if patient is still here or formally admitted on Monday to arrange for HD on Tuesday. Will need to have a full consult note.  Gean Quint, MD [facility]
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
## 2021-08-03 NOTE — ED Notes (Signed)
Patient received phone call from a female, something the caller said made the patient upset. Patient became loud on the phone. Patient then started to argue with the sitters. Security called to the bedside.

## 2021-08-03 NOTE — Progress Notes (Signed)
CSW followed up with [facility] admissions in reference to a referral sent yesterday. It was advised to resend the referral for review. It was reported that a female bed is available and admissions will contact CSW if the patient is accepted.   Glennie Isle, MSW, [place], LCAS-A Phone: [phone] Disposition/TOC

## 2021-08-03 NOTE — ED Notes (Signed)
Placed Breakfast Order 

## 2021-08-03 NOTE — ED Notes (Signed)
Pt refused d/c vitals.

## 2021-08-03 NOTE — ED Notes (Signed)
Report to Beth, RN

## 2021-08-03 NOTE — Progress Notes (Signed)
CSW provided the following resources for the patient to utilize upon discharge listed below:    [facility] provide timely access to mental health services for children and adolescents (4-17) and adults presenting in a mental health crisis. The program is designed for those who need urgent Behavioral Health or Substance Use treatment and are not experiencing a medical crisis that would typically require an emergency room visit.    [place], [place] [address] Phone: [phone] [email or web address]   The [facility] will also offer the following outpatient services: (Monday through Friday 8am-5pm)   Partial Hospitalization Program (PHP) Substance Abuse Intensive Outpatient Program (SA-IOP) Group Therapy Medication Management Peer Living Room   We also provide (24/7):    Assessments: Our mental health clinician and providers will conduct a focused mental health evaluation, assessing for immediate safety concerns and further mental health needs.   Referral: Our team will provide resources and help connect to community based mental health treatment, when indicated, including psychotherapy, psychiatry, and other specialized behavioral health or substance use disorder services (for those not already in treatment).   Transitional Care: Our team providers in person bridging and/or telphonic follow-up during the patient's transition to outpatient services.      Glennie Isle, MSW, [place], LCAS-A Phone: [phone] Disposition/TOC

## 2021-08-03 NOTE — ED Notes (Signed)
PT refused to have  Vital's taken .

## 2021-08-03 NOTE — ED Notes (Signed)
At desk awaiting discharge paperwork

## 2021-08-07 ENCOUNTER — Other Ambulatory Visit: Payer: Self-pay | Admitting: Pharmacist

## 2021-08-07 ENCOUNTER — Other Ambulatory Visit: Payer: Self-pay

## 2021-08-07 ENCOUNTER — Ambulatory Visit (INDEPENDENT_AMBULATORY_CARE_PROVIDER_SITE_OTHER): Payer: Medicaid Other | Admitting: Internal Medicine

## 2021-08-07 ENCOUNTER — Telehealth: Payer: Self-pay | Admitting: Internal Medicine

## 2021-08-07 ENCOUNTER — Ambulatory Visit: Payer: Self-pay | Admitting: *Deleted

## 2021-08-07 DIAGNOSIS — Z20828 Contact with and (suspected) exposure to other viral communicable diseases: Secondary | ICD-10-CM

## 2021-08-07 DIAGNOSIS — Z205 Contact with and (suspected) exposure to viral hepatitis: Secondary | ICD-10-CM

## 2021-08-07 DIAGNOSIS — Z23 Encounter for immunization: Secondary | ICD-10-CM

## 2021-08-07 DIAGNOSIS — Z992 Dependence on renal dialysis: Secondary | ICD-10-CM

## 2021-08-07 DIAGNOSIS — N186 End stage renal disease: Secondary | ICD-10-CM

## 2021-08-07 MED ORDER — HEPATITIS B IMMUNE GLOBULIN IM SOLN: 0.0600 mL/kg | Freq: Once | INTRAMUSCULAR | Status: DC

## 2021-08-07 NOTE — Telephone Encounter (Signed)
I called the patients cell number listed and left voicemail to call back to disclose hepatitis b exposure on 9/3.  Natasha Long [facility] [phone]

## 2021-08-07 NOTE — Telephone Encounter (Signed)
Two sets of staples on the left lower arm from a self inflicted cut on [id number]. No red streaks, no increased redness area, no pus draining now. Mildly warm to touch. Two areas of scabbed over stables drained pus 2 days ago. Care advice wash with mild soap/water daily and pat dry, apply neosporin daily to both. Monitor for fever, increased redness, red streaks, draining/swelling. Appointment on 08/11/21. Patient also needs counseling/behavioral health referral. Had bad experience at [address].      Reason for Disposition  [1] Minor cut or scratch AND [2] from self-injury (e.g., cutting, self-harm) AND [3] stable (i.e., not suicidal, not out of control)  Answer Assessment - Initial Assessment Questions 1. APPEARANCE of INJURY: "What does the injury look like?"      Two lacerated areas on left forearm with staples. 2. SIZE: "How large is the cut?"       3. BLEEDING: "Is it bleeding now?" If Yes, ask: "Is it difficult to stop?"      No bleeding or drainage past 24 hours 4. LOCATION: "Where is the injury located?"      Left forearm  5. ONSET: "How long ago did the injury occur?"      08/01/21 6. MECHANISM: "Tell me how it happened."      Cut herself due to suicidal thoughts 7. TETANUS: "When was the last tetanus booster?"    07/07/21 8. PREGNANCY: "Is there any chance you are pregnant?" "When was your last menstrual period?"     no  Protocols used: Cuts and Lacerations-A-AH

## 2021-08-07 NOTE — Progress Notes (Signed)
Patient ID: Natasha Long, female   DOB: 04-Sep-1996, 25 y.o.   MRN: [id number]  HPI  25yo F with hx of ESRD on HD, HTN, was in the ED on 9/2, for suicide ideation but also needed to be dialyzed on 9/3. On 9/7 she was identified as being a patient exposed to machine previously used on hep B patient, that was not cleaned per protocol. She went to her hemodialysis clinic appointment where her nephrologist and I disclosed this recent exposure where she would need hep b vaccination plus Immunoglobulin  Patient is very upset with finding out that she has had exposure to hepatitis B on 9/3. She is Here with her boyfriend, marquel price.   Outpatient Encounter Medications as of 08/07/2021  Medication Sig   acetaminophen (TYLENOL) 650 MG CR tablet Take 650-1,300 mg by mouth every 8 (eight) hours as needed for pain.   ARIPiprazole (ABILIFY) 5 MG tablet TAKE 1 TABLET BY MOUTH EVERY DAY (Patient taking differently: Take 5 mg by mouth daily.)   Blood Pressure Monitoring (BLOOD PRESSURE MONITOR 7) DEVI 1 Units by Does not apply route daily. Measure blood pressure daily   calcitRIOL (ROCALTROL) 0.25 MCG capsule Take 0.25 mcg by mouth every Monday, Wednesday, and Friday.    calcium acetate (PHOSLO) 667 MG capsule Take 667 mg by mouth 3 (three) times daily.   gabapentin (NEURONTIN) 100 MG capsule Take 1 capsule (100 mg total) by mouth 2 (two) times daily.   gabapentin (NEURONTIN) 600 MG tablet TAKE 1/2 TABLET BY MOUTH 3 TIMES DAILY (Patient taking differently: Take 300 mg by mouth 3 (three) times daily.)   lidocaine-prilocaine (EMLA) cream Apply 1 application topically See admin instructions. Prior to dialysis   losartan (COZAAR) 25 MG tablet Take 25 mg by mouth daily.   ondansetron (ZOFRAN ODT) 4 MG disintegrating tablet Take 1 tablet (4 mg total) by mouth every 8 (eight) hours as needed for nausea or vomiting.   ondansetron (ZOFRAN) 4 MG tablet Take 1 tablet (4 mg total) by mouth every 8 (eight) hours  as needed for nausea or vomiting.   oxyCODONE-acetaminophen (PERCOCET) 5-325 MG tablet Take 1 tablet by mouth every 4 (four) hours as needed for severe pain. (Patient not taking: No sig reported)   sodium bicarbonate 650 MG tablet Take 650 mg by mouth 2 (two) times daily.   Facility-Administered Encounter Medications as of 08/07/2021  Medication   ceFAZolin (ANCEF) 3 g in dextrose 5 % 50 mL IVPB   hepatitis B immune globulin injection 6.7 mL     Patient Active Problem List   Diagnosis Date Noted   Complication of vascular dialysis catheter 02/12/2021   Moderate protein-calorie malnutrition ([place]) 01/27/2021   Allergy, unspecified, initial encounter 01/23/2021   Anemia in chronic kidney disease 01/23/2021   End stage renal disease ([place]) 01/23/2021   Iron deficiency anemia, unspecified 01/23/2021   Other specified coagulation defects ([place]) 01/23/2021   Secondary hyperparathyroidism of renal origin ([place]) 01/23/2021   Shortness of breath 01/23/2021   Major depressive disorder, recurrent, unspecified ([place]) 01/23/2021   Anemia    Suicidal ideations    GI bleed 01/15/2021   Influenza vaccine refused 12/19/2020   Morbid obesity ([place]) 01/30/2020   Abscess of skin 01/30/2020   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   Chronic kidney disease 01/30/2020   Cocaine use, 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[place]) 01/30/2020   Affective psychosis, bipolar ([place]) 06/27/2019   Borderline personality disorder ([place]) 11/16/2018   Moderate cannabis use disorder (  [place]) 11/16/2018   Severe recurrent major depression without psychotic features ([place]) 11/15/2018   Chronic hypertension 04/04/2018   Nephrotic syndrome 04/01/2018   PTSD (post-traumatic stress disorder) 04/01/2018   Bipolar disorder, current episode depressed, mild ([place]) 04/09/2017     Health Maintenance Due  Topic Date Due   COVID-19 Vaccine (1) Never done   Pneumococcal Vaccine 33-86 Years old (1 - PCV) Never done   PAP-Cervical  Cytology Screening  04/01/2021   PAP SMEAR-Modifier  04/01/2021   INFLUENZA VACCINE  06/30/2021      Lab Results  Component Value Date   HEPBSAB NON REACTIVE 08/02/2021   Lab Results  Component Value Date   LABRPR NON REACTIVE 06/02/2021    CBC Lab Results  Component Value Date   WBC 10.2 08/01/2021   RBC 3.66 (L) 08/01/2021   HGB 11.5 (L) 08/01/2021   HCT 36.0 08/01/2021   PLT 250 08/01/2021   MCV 98.4 08/01/2021   MCH 31.4 08/01/2021   MCHC 31.9 08/01/2021   RDW 15.9 (H) 08/01/2021   LYMPHSABS 2.6 08/01/2021   MONOABS 0.6 08/01/2021   EOSABS 0.2 08/01/2021    BMET Lab Results  Component Value Date   NA 139 08/01/2021   K 4.7 08/01/2021   CL 99 08/01/2021   CO2 22 08/01/2021   GLUCOSE 79 08/01/2021   BUN 53 (H) 08/01/2021   CREATININE 12.51 (H) 08/01/2021   CALCIUM 8.9 08/01/2021   GFRNONAA 4 (L) 08/01/2021   GFRAA 27 (L) 01/30/2020      Assessment and Plan  Hepatitis B exposure, moderate exposure, since she was hemodialyzed on machine that was not completely disinfected per protocol.  Discussed plan for vaccine and IVIG and monthly testing of DNA virus  While preparing injections, she became visibly upset with her boyfriend and has left appt prior to receiving any interventions.

## 2021-08-08 ENCOUNTER — Telehealth: Payer: Self-pay | Admitting: Infectious Disease

## 2021-08-08 NOTE — Telephone Encounter (Signed)
Patient who left yesterday while HBIG was being prepared called me requesting treatment.   I am not certain there is anyone in clinic today to give this to her.  I believe not  I told her that ER is other possibility but she said the ER are ones that "messed up"  I told her next time we would bee open would be Monday at 845 but that we are not a walk in clinic. I expect she will walk in now and or Monday

## 2021-08-08 NOTE — Telephone Encounter (Signed)
Patient walked into [facility]. RN explained that we do not have the immunoglobulin any more, that she will have to get this from the emergency room. Will coordinate with Jimmy Footman for [place], [place]. Landis Gandy, RN

## 2021-08-09 ENCOUNTER — Other Ambulatory Visit: Payer: Self-pay

## 2021-08-09 DIAGNOSIS — F603 Borderline personality disorder: Secondary | ICD-10-CM | POA: Insufficient documentation

## 2021-08-09 DIAGNOSIS — F3130 Bipolar disorder, current episode depressed, mild or moderate severity, unspecified: Secondary | ICD-10-CM | POA: Insufficient documentation

## 2021-08-09 DIAGNOSIS — Z760 Encounter for issue of repeat prescription: Secondary | ICD-10-CM | POA: Insufficient documentation

## 2021-08-09 DIAGNOSIS — F1410 Cocaine abuse, uncomplicated: Secondary | ICD-10-CM | POA: Insufficient documentation

## 2021-08-09 DIAGNOSIS — F1290 Cannabis use, unspecified, uncomplicated: Secondary | ICD-10-CM | POA: Insufficient documentation

## 2021-08-09 DIAGNOSIS — R0989 Other specified symptoms and signs involving the circulatory and respiratory systems: Secondary | ICD-10-CM | POA: Insufficient documentation

## 2021-08-09 DIAGNOSIS — Z992 Dependence on renal dialysis: Secondary | ICD-10-CM | POA: Insufficient documentation

## 2021-08-09 NOTE — Progress Notes (Signed)
   08/09/21 2249  [place] (Walk-ins at [facility] only)  How Did You Hear About [place]? Self  What Is the Reason for Your Visit/Call Today? Natasha Long is a 25 yo female reporting as a walk in to [facility] for medication management. Pt reports that she used to get abilify shots and they made her "feel so much better". Pt reports that she recently had suicidal ideation and a suicide attempt--pt has a laceration on left forearm with staples. Pt states that she has PTSD from prior hospitalizations and freaked out when she was IVC'd recently. Pt states because she receives kidney dialysis inpatient psychiatric treatment facilities kept denying her. Pt reports that she currently does not have a psychiatric medication prescriber or counselor. Pt states that she was a pt of [facility] and that her PCP was prescribing until she could find another psychiatric provider. Pt states that her PCP has not given her medication in 2-3 months. Pt denying SI, HI, or AVH at time of assessment. Pt states that the reason she is here is to get medication.  How Long Has This Been Causing You Problems? 1-6 months  Have You Recently Had Any Thoughts About Hurting Yourself? Yes  How long ago did you have thoughts about hurting yourself? pt currently has staples in her body from self inflicted injury  Are You Planning to [place] At This time? No  Have you Recently Had Thoughts About [place]? No  Are You Planning To Harm Someone At This Time? No  Are you currently experiencing any auditory, visual or other hallucinations? No  Have You Used Any Alcohol or Drugs in the Past 24 Hours? No  What Did You Use and How Much? denied  Do you have any current medical co-morbidities that require immediate attention? Yes  Please describe current medical co-morbidities that require immediate attention: kidney failure and receiving dialysis.  Clinician description of patient physical appearance/behavior: pt is  cooperative and pleasant throughout assessment. Sad when discussing medication needs  What Do You Feel Would Help You the Most Today? Treatment for Depression or other mood problem  Determination of Need Routine (7 days)  Options For Referral Medication Management

## 2021-08-10 ENCOUNTER — Ambulatory Visit (HOSPITAL_COMMUNITY)
Admission: EM | Admit: 2021-08-10 | Discharge: 2021-08-10 | Disposition: A | Discharge status: Home / Self Care | Payer: Medicaid Other | Attending: Urology | Admitting: Urology

## 2021-08-10 DIAGNOSIS — F3130 Bipolar disorder, current episode depressed, mild or moderate severity, unspecified: Secondary | ICD-10-CM

## 2021-08-10 DIAGNOSIS — F603 Borderline personality disorder: Secondary | ICD-10-CM

## 2021-08-10 MED ORDER — ARIPIPRAZOLE 5 MG PO TABS: 5.0000 mg | ORAL_TABLET | Freq: Every day | ORAL | 0 refills | Status: DC

## 2021-08-10 NOTE — ED Provider Notes (Signed)
Behavioral Health Urgent Care Medical Screening Exam  Patient Name: Natasha Long MRN: [id number] Date of Evaluation: 08/10/21 Chief Complaint:   Diagnosis:  Final diagnoses:  Bipolar I disorder, most recent episode depressed (Natasha Long)  Borderline personality disorder (Natasha Long)    History of Present illness: Natasha Long is a 25 y.o. female with psychiatric history of Bipolar disorder, borderline personality disorder, suicidal attempt, major depressive disorder, and PTSD.  Patient presented voluntarily to [facility] requesting medication refills and resources for outpatient psychiatric services.  Patient seen face-to-face and chart reviewed by this NP.  On approach, patient is alert and oriented x4, calm and cooperative.  Patient is speaking in a clear and normal voice at a moderate rate with good eye contact.  Patient's thought process is coherent. patient's mood is depressed and her affect is congruent with her mood. She denies any acute psychiatric/medical complaint. AVF dialysis site is positive for both thrill and bruit. She is noted with 2 wounds to left forearms. Wounds appeared to be healing without signs of infection. Patient reports that wounds were due to suicidal attempt on 07/27/21.    She denies current suicidal ideation, homicidal ideation, hallucination and substance abuse. She admits to history of marijuana and cocaine abuse. She reports that she has been sober for 1 week and currently residing at the [facility] and undergoing substance abuse treatment program.  Patient reported that she was getting monthly Abilify injection through [facility] approximately 2 years ago. She reports she was restarted on Abilify '5mg'$ /day by her PCP however she didn't go to her follow-up appointment. She reports that she would like to be restarted on Abilify and given resources for outpatient psych providers to establish future care.       Psychiatric Specialty Exam  Presentation  General  Appearance:Appropriate for Environment  Eye Contact:Good  Speech:Clear and Coherent  Speech Volume:Normal  Handedness:Right   Mood and Affect  Mood:Depressed  Affect:Congruent   Thought Process  Thought Processes:Coherent  Descriptions of Associations:Intact  Orientation:Full (Time, Place and Person)  Thought Content:WDL  Diagnosis of Schizophrenia or Schizoaffective disorder in past: No   Hallucinations:None  Ideas of Reference:None  Suicidal Thoughts:No With Intent; With Plan; With Means to [place]; With Access to Means  Homicidal Thoughts:No   Sensorium  Memory:Immediate Good; Recent Good; Remote Good  Judgment:Good  Insight:Good   Executive Functions  Concentration:Good  Attention Span:Good  [place]  Language:Good   Psychomotor Activity  Psychomotor Activity:Normal   Assets  Assets:Desire for Improvement; Physical Health; Social Support; Transportation   Sleep  Sleep:Good  Number of hours: 7   No data recorded  Physical Exam: Physical Exam Vitals and nursing note reviewed.  Constitutional:      General: She is not in acute distress.    Appearance: She is well-developed. She is obese. She is not toxic-appearing.  HENT:     Head: Normocephalic.  Eyes:     Conjunctiva/sclera: Conjunctivae normal.  Cardiovascular:     Rate and Rhythm: Normal rate.  Pulmonary:     Effort: Pulmonary effort is normal. No respiratory distress.  Abdominal:     Palpations: Abdomen is soft.     Tenderness: There is no abdominal tenderness.  Musculoskeletal:     Cervical back: Normal range of motion.  Skin:    General: Skin is warm.  Neurological:     Mental Status: She is alert and oriented to person, place, and time.  Psychiatric:        Attention and  Perception: Attention and perception normal. She is attentive. She does not perceive auditory or visual hallucinations.        Mood and Affect: Mood is depressed.         Speech: Speech normal.        Behavior: Behavior normal. Behavior is cooperative.        Thought Content: Thought content normal. Thought content is not paranoid or delusional. Thought content does not include homicidal or suicidal ideation. Thought content does not include homicidal or suicidal plan.        Cognition and Memory: Cognition normal.        Judgment: Judgment normal.   Review of Systems  Constitutional: Negative.   HENT: Negative.    Eyes: Negative.   Respiratory: Negative.    Cardiovascular: Negative.   Genitourinary: Negative.   Musculoskeletal: Negative.   Skin:        Wound to left forearm with staples  Neurological: Negative.   Endo/Heme/Allergies: Negative.   Psychiatric/Behavioral:  Positive for depression. Negative for hallucinations, substance abuse and suicidal ideas.   Blood pressure (!) 142/80, pulse 70, temperature 98.7 F (37.1 C), temperature source Oral, resp. rate 16, SpO2 100 %. There is no height or weight on file to calculate BMI.  Musculoskeletal: Strength & Muscle Tone: within normal limits Gait & Station: normal Patient leans: Right   [place] MSE Discharge Disposition for Follow up and Recommendations: Based on my evaluation the patient does not appear to have an emergency medical condition and can be discharged with resources and follow up care in outpatient services for Medication Management and Individual Therapy -prescription refill for Abilify '5mg'$ /day X30days,  no refill -patient encouraged to establish care with outpatient psychiatric provider for follow-up care.  -resources for outpatient psychiatric provider given. -discussed and reviewed Open Access services and hours with patient.     Ophelia Shoulder, NP 08/10/2021, 3:32 AM

## 2021-08-10 NOTE — Discharge Instructions (Addendum)

## 2021-08-11 ENCOUNTER — Ambulatory Visit: Payer: Medicaid Other | Attending: Critical Care Medicine | Admitting: Critical Care Medicine

## 2021-08-11 ENCOUNTER — Encounter: Payer: Self-pay | Admitting: Critical Care Medicine

## 2021-08-11 ENCOUNTER — Other Ambulatory Visit: Payer: Self-pay

## 2021-08-11 VITALS — BP 156/113 | HR 79 | Resp 16 | Wt 254.6 lb

## 2021-08-11 DIAGNOSIS — F17210 Nicotine dependence, cigarettes, uncomplicated: Secondary | ICD-10-CM | POA: Insufficient documentation

## 2021-08-11 DIAGNOSIS — I10 Essential (primary) hypertension: Secondary | ICD-10-CM

## 2021-08-11 DIAGNOSIS — F319 Bipolar disorder, unspecified: Secondary | ICD-10-CM | POA: Insufficient documentation

## 2021-08-11 DIAGNOSIS — I120 Hypertensive chronic kidney disease with stage 5 chronic kidney disease or end stage renal disease: Secondary | ICD-10-CM | POA: Diagnosis not present

## 2021-08-11 DIAGNOSIS — N6452 Nipple discharge: Secondary | ICD-10-CM | POA: Insufficient documentation

## 2021-08-11 DIAGNOSIS — F603 Borderline personality disorder: Secondary | ICD-10-CM | POA: Insufficient documentation

## 2021-08-11 DIAGNOSIS — N186 End stage renal disease: Secondary | ICD-10-CM | POA: Insufficient documentation

## 2021-08-11 DIAGNOSIS — N898 Other specified noninflammatory disorders of vagina: Secondary | ICD-10-CM | POA: Insufficient documentation

## 2021-08-11 DIAGNOSIS — Z992 Dependence on renal dialysis: Secondary | ICD-10-CM | POA: Insufficient documentation

## 2021-08-11 DIAGNOSIS — R45851 Suicidal ideations: Secondary | ICD-10-CM | POA: Insufficient documentation

## 2021-08-11 DIAGNOSIS — F1494 Cocaine use, unspecified with cocaine-induced mood disorder: Secondary | ICD-10-CM

## 2021-08-11 DIAGNOSIS — Z888 Allergy status to other drugs, medicaments and biological substances status: Secondary | ICD-10-CM | POA: Insufficient documentation

## 2021-08-11 DIAGNOSIS — N644 Mastodynia: Secondary | ICD-10-CM | POA: Diagnosis not present

## 2021-08-11 DIAGNOSIS — Z79899 Other long term (current) drug therapy: Secondary | ICD-10-CM | POA: Diagnosis not present

## 2021-08-11 DIAGNOSIS — F332 Major depressive disorder, recurrent severe without psychotic features: Secondary | ICD-10-CM

## 2021-08-11 DIAGNOSIS — Z202 Contact with and (suspected) exposure to infections with a predominantly sexual mode of transmission: Secondary | ICD-10-CM

## 2021-08-11 DIAGNOSIS — Z720 Tobacco use: Secondary | ICD-10-CM

## 2021-08-11 DIAGNOSIS — N2581 Secondary hyperparathyroidism of renal origin: Secondary | ICD-10-CM | POA: Diagnosis not present

## 2021-08-11 DIAGNOSIS — Z124 Encounter for screening for malignant neoplasm of cervix: Secondary | ICD-10-CM

## 2021-08-11 MED ORDER — HYDROXYZINE PAMOATE 25 MG PO CAPS: 25.0000 mg | ORAL_CAPSULE | Freq: Three times a day (TID) | ORAL | 3 refills | Status: DC | PRN

## 2021-08-11 MED ORDER — OMEPRAZOLE 20 MG PO CPDR: 20.0000 mg | DELAYED_RELEASE_CAPSULE | Freq: Every day | ORAL | 3 refills | Status: DC

## 2021-08-11 NOTE — Assessment & Plan Note (Signed)
Not ideally controlled continue current medication

## 2021-08-11 NOTE — Assessment & Plan Note (Signed)
Obtain RPR and cervical vaginal swab

## 2021-08-11 NOTE — Assessment & Plan Note (Signed)
  .   Current smoking consumption amount: 4 to 5 cigarettes daily  . Dicsussion on advise to quit smoking and smoking impacts: Cardiovascular impacts  . Patient's willingness to quit: Wanting to quit  . Methods to quit smoking discussed: Behavioral modification  . Medication management of smoking session drugs discussed: Not indicated   . Setting quit date not established  . Follow-up arranged 2 months   Time spent counseling the patient: 5 minutes

## 2021-08-11 NOTE — Patient Instructions (Signed)
Cervical vaginal screen was obtained and an RPR for syphilis screening was also obtained we will call you with results  Hydroxyzine was sent to your friendly pharmacy  No other medication changes you declined the flu vaccine  A stomach acid medicine was sent to the pharmacy  A referral to gynecology for a Pap smear was made  A breast ultrasound was made for breast pain  Return to Dr. Joya Gaskins in 2 months

## 2021-08-11 NOTE — Assessment & Plan Note (Signed)
Severe major depression recurrent with suicidal ideation bipolar subtype  Continue Abilify  Keep upcoming appointment with mental health  Begin hydroxyzine 3 times daily as needed

## 2021-08-11 NOTE — Assessment & Plan Note (Signed)
Per renal
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
## 2021-08-11 NOTE — Assessment & Plan Note (Signed)
Suicidal ideations have abated but she is at high risk for recurrence

## 2021-08-11 NOTE — Assessment & Plan Note (Signed)
Patient states she is not currently using

## 2021-08-11 NOTE — Assessment & Plan Note (Signed)
Now on dialysis 3 times weekly  Per nephrology

## 2021-08-11 NOTE — Assessment & Plan Note (Signed)
Breast pain and nipple discharge Will obtain breast ultrasound

## 2021-08-12 ENCOUNTER — Telehealth: Payer: Self-pay

## 2021-08-12 LAB — HIV ANTIBODY (ROUTINE TESTING W REFLEX): HIV Screen 4th Generation wRfx: NONREACTIVE

## 2021-08-12 LAB — RPR: RPR Ser Ql: NONREACTIVE

## 2021-08-12 NOTE — Telephone Encounter (Signed)
Contacted pt to go over lab results pt didn't answer lvm asking pt to give me a call at her/his earliest convenience   Sent a CRM and forward labs to NT to give pt labs when they call back

## 2021-09-11 ENCOUNTER — Encounter: Payer: Medicaid Other | Admitting: Obstetrics and Gynecology

## 2021-10-04 ENCOUNTER — Encounter (HOSPITAL_COMMUNITY): Payer: Self-pay

## 2021-10-04 ENCOUNTER — Emergency Department (HOSPITAL_COMMUNITY)
Admission: EM | Admit: 2021-10-04 | Discharge: 2021-10-04 | Disposition: A | Discharge status: Home / Self Care | Payer: Medicaid Other | Attending: Emergency Medicine | Admitting: Emergency Medicine

## 2021-10-04 DIAGNOSIS — Z5321 Procedure and treatment not carried out due to patient leaving prior to being seen by health care provider: Secondary | ICD-10-CM | POA: Diagnosis not present

## 2021-10-04 DIAGNOSIS — K921 Melena: Secondary | ICD-10-CM | POA: Insufficient documentation

## 2021-10-04 LAB — I-STAT BETA HCG BLOOD, ED (MC, WL, AP ONLY): I-stat hCG, quantitative: <5 m[IU]/mL (ref <5)

## 2021-10-04 LAB — TYPE AND SCREEN
ABO/RH(D): A NEG
Antibody Screen: NEGATIVE

## 2021-10-04 LAB — COMPREHENSIVE METABOLIC PANEL
ALT: 37 U/L (ref 0–44)
AST: 25 U/L (ref 15–41)
Albumin: 3.4 g/dL — ABNORMAL LOW (ref 3.5–5.0)
Alkaline Phosphatase: 57 U/L (ref 38–126)
Anion gap: 13 (ref 5–15)
BUN: 102 mg/dL — ABNORMAL HIGH (ref 6–20)
CO2: 18 mmol/L — ABNORMAL LOW (ref 22–32)
Calcium: 7.1 mg/dL — ABNORMAL LOW (ref 8.9–10.3)
Chloride: 105 mmol/L (ref 98–111)
Creatinine, Ser: 17.68 mg/dL — ABNORMAL HIGH (ref 0.44–1.00)
GFR, Estimated: 3 mL/min — ABNORMAL LOW (ref >60)
Glucose, Bld: 95 mg/dL (ref 70–99)
Potassium: 4.6 mmol/L (ref 3.5–5.1)
Sodium: 136 mmol/L (ref 135–145)
Total Bilirubin: 0.4 mg/dL (ref 0.3–1.2)
Total Protein: 6.6 g/dL (ref 6.5–8.1)

## 2021-10-04 LAB — CBC
HCT: 27.7 % — ABNORMAL LOW (ref 36.0–46.0)
Hemoglobin: 8.9 g/dL — ABNORMAL LOW (ref 12.0–15.0)
MCH: 31.8 pg (ref 26.0–34.0)
MCHC: 32.1 g/dL (ref 30.0–36.0)
MCV: 98.9 fL (ref 80.0–100.0)
Platelets: 209 10*3/uL (ref 150–400)
RBC: 2.8 MIL/uL — ABNORMAL LOW (ref 3.87–5.11)
RDW: 15.3 % (ref 11.5–15.5)
WBC: 7.9 10*3/uL (ref 4.0–10.5)
nRBC: 0 % (ref 0.0–0.2)

## 2021-10-04 NOTE — ED Triage Notes (Signed)
Pt states that she is a dialysis pt and has not received a treatment since last Tuesday due to being homeless and last night began to have dark red stools

## 2021-10-04 NOTE — ED Notes (Signed)
Called pt for vitals x1, no response

## 2021-10-04 NOTE — ED Notes (Signed)
Pt called for vital signs, but not respond.

## 2021-10-04 NOTE — ED Notes (Signed)
Pt called for vital signs x2 with no response.

## 2021-10-04 NOTE — ED Notes (Signed)
Pt called for vital sis x3 with no response.

## 2021-10-29 ENCOUNTER — Other Ambulatory Visit: Payer: Self-pay | Admitting: Critical Care Medicine

## 2021-10-30 NOTE — Telephone Encounter (Signed)
Requested medications are due for refill today.  unsure  Requested medications are on the active medications list.  no  Last refill. 04/30/2021  Future visit scheduled.   no  Notes to clinic.  I am unsure which dosage pt should be taking. Please advise.

## 2021-12-04 DIAGNOSIS — N186 End stage renal disease: Secondary | ICD-10-CM | POA: Insufficient documentation

## 2021-12-18 ENCOUNTER — Ambulatory Visit (HOSPITAL_COMMUNITY): Payer: Medicaid Other | Admitting: Psychiatry

## 2021-12-23 ENCOUNTER — Other Ambulatory Visit: Payer: Self-pay

## 2021-12-23 ENCOUNTER — Emergency Department (HOSPITAL_COMMUNITY): Payer: Medicaid Other

## 2021-12-23 ENCOUNTER — Encounter (HOSPITAL_COMMUNITY): Payer: Self-pay | Admitting: Internal Medicine

## 2021-12-23 ENCOUNTER — Inpatient Hospital Stay (HOSPITAL_COMMUNITY)
Admission: EM | Admit: 2021-12-23 | Discharge: 2021-12-26 | DRG: 291 | Disposition: A | Discharge status: Home / Self Care | Payer: Medicaid Other | Attending: Internal Medicine | Admitting: Internal Medicine

## 2021-12-23 DIAGNOSIS — Z992 Dependence on renal dialysis: Secondary | ICD-10-CM

## 2021-12-23 DIAGNOSIS — G4700 Insomnia, unspecified: Secondary | ICD-10-CM | POA: Diagnosis present

## 2021-12-23 DIAGNOSIS — F603 Borderline personality disorder: Secondary | ICD-10-CM | POA: Diagnosis present

## 2021-12-23 DIAGNOSIS — Z9151 Personal history of suicidal behavior: Secondary | ICD-10-CM

## 2021-12-23 DIAGNOSIS — Z5901 Sheltered homelessness: Secondary | ICD-10-CM

## 2021-12-23 DIAGNOSIS — Z79899 Other long term (current) drug therapy: Secondary | ICD-10-CM

## 2021-12-23 DIAGNOSIS — Z6838 Body mass index (BMI) 38.0-38.9, adult: Secondary | ICD-10-CM

## 2021-12-23 DIAGNOSIS — E8770 Fluid overload, unspecified: Secondary | ICD-10-CM

## 2021-12-23 DIAGNOSIS — N049 Nephrotic syndrome with unspecified morphologic changes: Secondary | ICD-10-CM | POA: Diagnosis present

## 2021-12-23 DIAGNOSIS — D631 Anemia in chronic kidney disease: Secondary | ICD-10-CM | POA: Diagnosis present

## 2021-12-23 DIAGNOSIS — R451 Restlessness and agitation: Secondary | ICD-10-CM | POA: Diagnosis present

## 2021-12-23 DIAGNOSIS — I5031 Acute diastolic (congestive) heart failure: Secondary | ICD-10-CM | POA: Diagnosis not present

## 2021-12-23 DIAGNOSIS — M898X9 Other specified disorders of bone, unspecified site: Secondary | ICD-10-CM | POA: Diagnosis present

## 2021-12-23 DIAGNOSIS — Z20822 Contact with and (suspected) exposure to covid-19: Secondary | ICD-10-CM | POA: Diagnosis present

## 2021-12-23 DIAGNOSIS — Z720 Tobacco use: Secondary | ICD-10-CM | POA: Diagnosis present

## 2021-12-23 DIAGNOSIS — F1220 Cannabis dependence, uncomplicated: Secondary | ICD-10-CM | POA: Diagnosis present

## 2021-12-23 DIAGNOSIS — N289 Disorder of kidney and ureter, unspecified: Secondary | ICD-10-CM

## 2021-12-23 DIAGNOSIS — F17210 Nicotine dependence, cigarettes, uncomplicated: Secondary | ICD-10-CM | POA: Diagnosis present

## 2021-12-23 DIAGNOSIS — Z833 Family history of diabetes mellitus: Secondary | ICD-10-CM

## 2021-12-23 DIAGNOSIS — I161 Hypertensive emergency: Secondary | ICD-10-CM | POA: Diagnosis present

## 2021-12-23 DIAGNOSIS — Z8249 Family history of ischemic heart disease and other diseases of the circulatory system: Secondary | ICD-10-CM

## 2021-12-23 DIAGNOSIS — F1494 Cocaine use, unspecified with cocaine-induced mood disorder: Secondary | ICD-10-CM | POA: Diagnosis present

## 2021-12-23 DIAGNOSIS — F1414 Cocaine abuse with cocaine-induced mood disorder: Secondary | ICD-10-CM | POA: Diagnosis present

## 2021-12-23 DIAGNOSIS — R778 Other specified abnormalities of plasma proteins: Secondary | ICD-10-CM | POA: Diagnosis not present

## 2021-12-23 DIAGNOSIS — I132 Hypertensive heart and chronic kidney disease with heart failure and with stage 5 chronic kidney disease, or end stage renal disease: Principal | ICD-10-CM | POA: Diagnosis present

## 2021-12-23 DIAGNOSIS — F319 Bipolar disorder, unspecified: Secondary | ICD-10-CM | POA: Diagnosis present

## 2021-12-23 DIAGNOSIS — I248 Other forms of acute ischemic heart disease: Secondary | ICD-10-CM | POA: Diagnosis present

## 2021-12-23 DIAGNOSIS — I10 Essential (primary) hypertension: Secondary | ICD-10-CM | POA: Diagnosis present

## 2021-12-23 DIAGNOSIS — Z9115 Patient's noncompliance with renal dialysis: Secondary | ICD-10-CM

## 2021-12-23 DIAGNOSIS — E872 Acidosis, unspecified: Secondary | ICD-10-CM | POA: Diagnosis present

## 2021-12-23 DIAGNOSIS — I5033 Acute on chronic diastolic (congestive) heart failure: Secondary | ICD-10-CM | POA: Diagnosis present

## 2021-12-23 DIAGNOSIS — R079 Chest pain, unspecified: Secondary | ICD-10-CM

## 2021-12-23 DIAGNOSIS — N186 End stage renal disease: Secondary | ICD-10-CM | POA: Diagnosis present

## 2021-12-23 DIAGNOSIS — G629 Polyneuropathy, unspecified: Secondary | ICD-10-CM | POA: Diagnosis present

## 2021-12-23 HISTORY — DX: End stage renal disease: N18.6

## 2021-12-23 HISTORY — DX: End stage renal disease: Z99.2

## 2021-12-23 LAB — CBC WITH DIFFERENTIAL/PLATELET
Abs Immature Granulocytes: 0.04 10*3/uL (ref 0.00–0.07)
Basophils Absolute: 0 10*3/uL (ref 0.0–0.1)
Basophils Relative: 0 %
Eosinophils Absolute: 0.2 10*3/uL (ref 0.0–0.5)
Eosinophils Relative: 2 %
HCT: 32.2 % — ABNORMAL LOW (ref 36.0–46.0)
Hemoglobin: 10.5 g/dL — ABNORMAL LOW (ref 12.0–15.0)
Immature Granulocytes: 0 %
Lymphocytes Relative: 11 %
Lymphs Abs: 1.2 10*3/uL (ref 0.7–4.0)
MCH: 30.6 pg (ref 26.0–34.0)
MCHC: 32.6 g/dL (ref 30.0–36.0)
MCV: 93.9 fL (ref 80.0–100.0)
Monocytes Absolute: 0.6 10*3/uL (ref 0.1–1.0)
Monocytes Relative: 5 %
Neutro Abs: 9.1 10*3/uL — ABNORMAL HIGH (ref 1.7–7.7)
Neutrophils Relative %: 82 %
Platelets: 167 10*3/uL (ref 150–400)
RBC: 3.43 MIL/uL — ABNORMAL LOW (ref 3.87–5.11)
RDW: 14.5 % (ref 11.5–15.5)
WBC: 11.2 10*3/uL — ABNORMAL HIGH (ref 4.0–10.5)
nRBC: 0 % (ref 0.0–0.2)

## 2021-12-23 LAB — TROPONIN I (HIGH SENSITIVITY)
Troponin I (High Sensitivity): 89 ng/L — ABNORMAL HIGH (ref <18)
Troponin I (High Sensitivity): 95 ng/L — ABNORMAL HIGH (ref <18)

## 2021-12-23 LAB — RESP PANEL BY RT-PCR (FLU A&B, COVID) ARPGX2
Influenza A by PCR: NEGATIVE
Influenza B by PCR: NEGATIVE
SARS Coronavirus 2 by RT PCR: NEGATIVE

## 2021-12-23 LAB — BASIC METABOLIC PANEL
Anion gap: 19 — ABNORMAL HIGH (ref 5–15)
BUN: 123 mg/dL — ABNORMAL HIGH (ref 6–20)
CO2: 15 mmol/L — ABNORMAL LOW (ref 22–32)
Calcium: 7.4 mg/dL — ABNORMAL LOW (ref 8.9–10.3)
Chloride: 108 mmol/L (ref 98–111)
Creatinine, Ser: 22.88 mg/dL — ABNORMAL HIGH (ref 0.44–1.00)
GFR, Estimated: 2 mL/min — ABNORMAL LOW (ref >60)
Glucose, Bld: 99 mg/dL (ref 70–99)
Potassium: 4.9 mmol/L (ref 3.5–5.1)
Sodium: 142 mmol/L (ref 135–145)

## 2021-12-23 LAB — I-STAT BETA HCG BLOOD, ED (MC, WL, AP ONLY): I-stat hCG, quantitative: <5 m[IU]/mL (ref <5)

## 2021-12-23 LAB — BRAIN NATRIURETIC PEPTIDE: B Natriuretic Peptide: 690.2 pg/mL — ABNORMAL HIGH (ref 0.0–100.0)

## 2021-12-23 MED ORDER — RENAL VITAMIN 0.8 MG PO TABS: 1.0000 | ORAL_TABLET | Freq: Every day | ORAL | Status: DC

## 2021-12-23 MED ORDER — LABETALOL HCL 5 MG/ML IV SOLN
0.5000 mg/min | Status: DC
  Administered 2021-12-23: 13:00:00 0.5 mg/min via INTRAVENOUS
  Filled 2021-12-23: qty 80

## 2021-12-23 MED ORDER — RENA-VITE PO TABS
1.0000 | ORAL_TABLET | Freq: Every day | ORAL | Status: DC
  Administered 2021-12-24 – 2021-12-26 (×2): 1 via ORAL
  Filled 2021-12-23 (×5): qty 1

## 2021-12-23 MED ORDER — ZOLPIDEM TARTRATE 5 MG PO TABS
5.0000 mg | ORAL_TABLET | Freq: Every evening | ORAL | Status: DC | PRN
  Administered 2021-12-26: 5 mg via ORAL
  Filled 2021-12-23: qty 1

## 2021-12-23 MED ORDER — ACETAMINOPHEN 650 MG RE SUPP: 650.0000 mg | Freq: Four times a day (QID) | RECTAL | Status: DC | PRN

## 2021-12-23 MED ORDER — SODIUM CHLORIDE 0.9% FLUSH
3.0000 mL | Freq: Two times a day (BID) | INTRAVENOUS | Status: DC
  Administered 2021-12-24 – 2021-12-26 (×4): 3 mL via INTRAVENOUS

## 2021-12-23 MED ORDER — ONDANSETRON HCL 4 MG PO TABS: 4.0000 mg | ORAL_TABLET | Freq: Four times a day (QID) | ORAL | Status: DC | PRN

## 2021-12-23 MED ORDER — CAMPHOR-MENTHOL 0.5-0.5 % EX LOTN: 1.0000 "application " | TOPICAL_LOTION | Freq: Three times a day (TID) | CUTANEOUS | Status: DC | PRN

## 2021-12-23 MED ORDER — HYDROXYZINE HCL 25 MG PO TABS
25.0000 mg | ORAL_TABLET | Freq: Three times a day (TID) | ORAL | Status: DC | PRN
  Administered 2021-12-24: 18:00:00 25 mg via ORAL
  Filled 2021-12-23: qty 1

## 2021-12-23 MED ORDER — NEPRO/CARBSTEADY PO LIQD: 237.0000 mL | Freq: Three times a day (TID) | ORAL | Status: DC | PRN

## 2021-12-23 MED ORDER — MORPHINE SULFATE (PF) 4 MG/ML IV SOLN
4.0000 mg | Freq: Once | INTRAVENOUS | Status: AC
  Administered 2021-12-23: 11:00:00 4 mg via INTRAVENOUS
  Filled 2021-12-23: qty 1

## 2021-12-23 MED ORDER — ARIPIPRAZOLE 5 MG PO TABS
5.0000 mg | ORAL_TABLET | Freq: Every day | ORAL | Status: DC
  Administered 2021-12-24 – 2021-12-25 (×2): 5 mg via ORAL
  Filled 2021-12-23 (×4): qty 1

## 2021-12-23 MED ORDER — ACETAMINOPHEN 325 MG PO TABS
650.0000 mg | ORAL_TABLET | Freq: Once | ORAL | Status: AC
  Administered 2021-12-23: 04:00:00 650 mg via ORAL
  Filled 2021-12-23: qty 2

## 2021-12-23 MED ORDER — NITROGLYCERIN IN D5W 200-5 MCG/ML-% IV SOLN
0.0000 ug/min | INTRAVENOUS | Status: DC
  Administered 2021-12-23: 11:00:00 5 ug/min via INTRAVENOUS
  Filled 2021-12-23: qty 250

## 2021-12-23 MED ORDER — PANTOPRAZOLE SODIUM 40 MG PO TBEC
40.0000 mg | DELAYED_RELEASE_TABLET | Freq: Every day | ORAL | Status: DC
  Administered 2021-12-24 – 2021-12-26 (×4): 40 mg via ORAL
  Filled 2021-12-23 (×5): qty 1

## 2021-12-23 MED ORDER — SORBITOL 70 % SOLN: 30.0000 mL | Status: DC | PRN

## 2021-12-23 MED ORDER — LABETALOL HCL 5 MG/ML IV SOLN: 10.0000 mg | Freq: Once | INTRAVENOUS | Status: DC

## 2021-12-23 MED ORDER — NICOTINE 14 MG/24HR TD PT24
14.0000 mg | MEDICATED_PATCH | Freq: Every day | TRANSDERMAL | Status: DC | PRN
  Administered 2021-12-25: 14 mg via TRANSDERMAL
  Filled 2021-12-23 (×3): qty 1

## 2021-12-23 MED ORDER — CALCIUM CARBONATE ANTACID 1250 MG/5ML PO SUSP
500.0000 mg | Freq: Four times a day (QID) | ORAL | Status: DC | PRN
  Filled 2021-12-23: qty 5

## 2021-12-23 MED ORDER — HYDRALAZINE HCL 20 MG/ML IJ SOLN: 5.0000 mg | INTRAMUSCULAR | Status: DC | PRN

## 2021-12-23 MED ORDER — CALCITRIOL 0.5 MCG PO CAPS
1.5000 ug | ORAL_CAPSULE | ORAL | Status: DC
  Administered 2021-12-25: 1.5 ug via ORAL
  Filled 2021-12-23: qty 3

## 2021-12-23 MED ORDER — HEPARIN SODIUM (PORCINE) 5000 UNIT/ML IJ SOLN
5000.0000 [IU] | Freq: Three times a day (TID) | INTRAMUSCULAR | Status: DC
  Filled 2021-12-23: qty 1

## 2021-12-23 MED ORDER — CALCIUM ACETATE (PHOS BINDER) 667 MG PO CAPS
667.0000 mg | ORAL_CAPSULE | Freq: Three times a day (TID) | ORAL | Status: DC
  Administered 2021-12-24 (×3): 667 mg via ORAL
  Filled 2021-12-23 (×3): qty 1

## 2021-12-23 MED ORDER — DOCUSATE SODIUM 283 MG RE ENEM: 1.0000 | ENEMA | RECTAL | Status: DC | PRN

## 2021-12-23 MED ORDER — LOSARTAN POTASSIUM 50 MG PO TABS
50.0000 mg | ORAL_TABLET | Freq: Every day | ORAL | Status: DC
  Administered 2021-12-24 – 2021-12-26 (×3): 50 mg via ORAL
  Filled 2021-12-23 (×5): qty 1

## 2021-12-23 MED ORDER — ONDANSETRON HCL 4 MG/2ML IJ SOLN: 4.0000 mg | Freq: Four times a day (QID) | INTRAMUSCULAR | Status: DC | PRN

## 2021-12-23 MED ORDER — GABAPENTIN 300 MG PO CAPS
300.0000 mg | ORAL_CAPSULE | Freq: Three times a day (TID) | ORAL | Status: DC
  Administered 2021-12-24 – 2021-12-26 (×8): 300 mg via ORAL
  Filled 2021-12-23 (×8): qty 1

## 2021-12-23 MED ORDER — ACETAMINOPHEN 325 MG PO TABS
650.0000 mg | ORAL_TABLET | Freq: Four times a day (QID) | ORAL | Status: DC | PRN
  Administered 2021-12-24: 650 mg via ORAL
  Filled 2021-12-23: qty 2

## 2021-12-23 MED ORDER — CHLORHEXIDINE GLUCONATE CLOTH 2 % EX PADS: 6.0000 | MEDICATED_PAD | Freq: Every day | CUTANEOUS | Status: DC

## 2021-12-23 NOTE — ED Triage Notes (Signed)
Pt here via [facility] for cp/shob, pt wouldn't answer any questions for EMS. Dialysis Tues, Thurs, sat - unknown last session. Pt has been hypertensive 200s, all other vitals stable
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
## 2021-12-23 NOTE — H&P (Signed)
History and Physical    Patient: Natasha Long EQA:[id number] DOB: [birth year] DOA: 12/23/2021 DOS: the patient was seen and examined on 12/23/2021 PCP: [name], MD  Patient coming from: Home - lives alone   Chief Complaint: Chest pain  HPI: Natasha Long is a 26 y.o. female with medical history significant of borderline PD; HTN; PTSD; and ESRD on TTS HD presenting with chest pain.  She was quite somnolent at the time of my evaluation but eventually did provide some information.  She reported severe CP for a while now.  She has missed HD for some time due to transportation issues.  She is feeling better at this time.    ER Course:  Severe CP x 1 week.  On TTS HD, no HD in >3 weeks due to transportation issues.  Troponin 89, 95 - likely demand from volume overload.  Started on labetalol infusion, treating for HTN crisis.  Cards and nephrology will consult.    Review of Systems: As mentioned in the history of present illness. All other systems reviewed and are negative.  Limited by patient participation.  Past Medical History:  Diagnosis Date   Asthma    as a child, no problem as an adult, no inhaler   Complication of anesthesia    woke up before tube removed, 1 time fought nurses   ESRD on hemodialysis [facility])    M-W-F   History of borderline personality disorder    Hypertension    diagnosed as child; stopped meds at 61 yo   Insomnia    Neuromuscular disorder ([place])    peripheral neuropathy   PTSD (post-traumatic stress disorder)    Past Surgical History:  Procedure Laterality Date   AV FISTULA PLACEMENT Left 10/18/2020   Procedure: LEFT ARM ARTERIOVENOUS (AV) FISTULA CREATION;  Surgeon: Serafina Mitchell, MD;  Location: [place];  Service: Vascular;  Laterality: Left;   CHOLECYSTECTOMY     extraction of wisdom teeth     FISTULA SUPERFICIALIZATION Left 02/13/2021   Procedure: LEFT BRACHIOCEPHALIC ARTERIOVENOUS FISTULA SUPERFICIALIZATION;  Surgeon: Serafina Mitchell,  MD;  Location: [place];  Service: Vascular;  Laterality: Left;   RENAL BIOPSY     x 2   TUNNELED VENOUS CATHETER PLACEMENT  02/11/2021   [facility]   Social History:  reports that she has been smoking cigarettes. She has a 5.00 pack-year smoking history. She has never used smokeless tobacco. She reports current alcohol use. She reports current drug use. Drugs: Marijuana and Cocaine.  Allergies  Allergen Reactions   Prozac [Fluoxetine Hcl] Other (See Comments)    Panic attack    Wellbutrin [Bupropion] Other (See Comments)    Panic attack   Prednisone Other (See Comments)    Pt states that this med caused pancreatitis.     Family History  Adopted: Yes  Problem Relation Age of Onset   Diabetes Other    Hypertension Other     Prior to Admission medications   Medication Sig Start Date End Date Taking? Authorizing Provider  acetaminophen (TYLENOL) 650 MG CR tablet Take 650-1,300 mg by mouth every 8 (eight) hours as needed for pain.    [provider]  ARIPiprazole (ABILIFY) 5 MG tablet Take 1 tablet (5 mg total) by mouth daily. 08/10/21   Ajibola, Rosezella [place] A, NP  Blood Pressure Monitoring (BLOOD PRESSURE MONITOR 7) DEVI 1 Units by Does not apply route daily. Measure blood pressure daily 02/29/20   [name], MD  calcitRIOL (ROCALTROL) 0.25  MCG capsule Take 0.25 mcg by mouth every Monday, Wednesday, and Friday.  09/25/20   [provider]  calcium acetate (PHOSLO) 667 MG capsule Take 667 mg by mouth 3 (three) times daily. 07/03/21   [provider]  gabapentin (NEURONTIN) 100 MG capsule Take 1 capsule (100 mg total) by mouth 2 (two) times daily. Patient not taking: Reported on 08/11/2021 01/17/21 08/11/21  Matilde Haymaker, MD  gabapentin (NEURONTIN) 600 MG tablet Take 0.5 tablets (300 mg total) by mouth 3 (three) times daily. 10/31/21   [name], MD  hydrOXYzine (VISTARIL) 25 MG capsule Take 1 capsule (25 mg total) by mouth every 8 (eight) hours as needed.  08/11/21   [name], MD  lidocaine-prilocaine (EMLA) cream Apply 1 application topically See admin instructions. Prior to dialysis 03/06/21   [provider]  losartan (COZAAR) 25 MG tablet Take 25 mg by mouth daily. 09/25/20   [provider]  omeprazole (PRILOSEC) 20 MG capsule Take 1 capsule (20 mg total) by mouth daily. 08/11/21   [name], MD  ondansetron (ZOFRAN ODT) 4 MG disintegrating tablet Take 1 tablet (4 mg total) by mouth every 8 (eight) hours as needed for nausea or vomiting. 03/18/21   Margette Fast, MD    Physical Exam: Vitals:   12/23/21 1518 12/23/21 1545 12/23/21 1600 12/23/21 1630  BP: (!) 154/78 (!) 141/82 (!) 143/85 (!) 142/78  Pulse: 69 75    Resp: 19 17 19 16   Temp:  99.3 F (37.4 C)    TempSrc:  Oral    SpO2: 100% 98%     General:  Appears calm and comfortable and is in NAD Eyes:   EOMI, normal lids, iris ENT:  grossly normal hearing, lips & tongue, mmm Neck:  no LAD, masses or thyromegaly Cardiovascular:  RRR, no m/r/g. No LE edema.  Respiratory:   CTA bilaterally with no wheezes/rales/rhonchi.  Normal respiratory effort. Abdomen:  soft, NT, ND Skin:  no rash or induration seen on limited exam Musculoskeletal:  grossly normal tone BUE/BLE, good ROM, no bony abnormality Psychiatric:  flat mood and affect, speech limited but appropriate, AOx3 Neurologic:  CN 2-12 grossly intact, moves all extremities in coordinated fashion   Radiological Exams on Admission: Independently reviewed - see discussion in A/P where applicable  DG Chest 2 View  Result Date: 12/23/2021 CLINICAL DATA:  Chest pain, dyspnea EXAM: CHEST - 2 VIEW COMPARISON:  06/20/2021 FINDINGS: Minimal right basilar scarring again noted. The lungs are otherwise clear. No pneumothorax or pleural effusion. Cardiac size within normal limits. Pulmonary vascularity is normal. No acute bone abnormality. IMPRESSION: No active cardiopulmonary disease. Electronically Signed    By: Fidela Salisbury M.D.   On: 12/23/2021 05:02    EKG: Independently reviewed.  NSR with rate 74; no evidence of acute ischemia   Labs on Admission: I have personally reviewed the available labs and imaging studies at the time of the admission.  Pertinent labs:    CO2 15 BUN 123/Creatinine 22.88/GFR 2 BNP 690.2 HS troponin 89, 95 WBC 11.2 Hgb 10.5 HCG <5 COVID/flu negative   Assessment/Plan * Hypervolemia associated with renal insufficiency -Patient with h/o ESRD but has not been recently attending HD sessions -Metabolic acidosis, volume overload noted on presentation -She reports that this is due to "transportation issues" but acknowledges that her center does not need to be changed due to location -She is likely to benefit from serial HD both today and tomorrow and may be appropriate for  d/c to home tomorrow after HD -Ongoing compliance should be encouraged  Elevated troponin- (present on admission) -Flat troponin -Likely related to demand ischemia -Chest pain has been present for a week and is improved with BP control in the ER - unlikely ACS -Cardiology consulted and has recommended echo  Tobacco use- (present on admission) -Encourage cessation.   -This was discussed with the patient and should be reviewed on an ongoing basis.   -Patch ordered at patient request.  ESRD on dialysis [facility]) -Patient on chronic TTS HD -Nephrology prn order set utilized -Nephrology is aware that patient will need HD  Cocaine use, 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[place])- (present on admission) -Cessation encouraged; this should be encouraged on an ongoing basis -UDS ordered  Morbid obesity ([place])- (present on admission) -Prior BMI was 38.7 -Current weight requested -Weight loss should be encouraged -Outpatient PCP/bariatric medicine/bariatric surgery f/u encouraged  Moderate cannabis use disorder ([place])- (present on admission) -Cessation encouraged; this should be  encouraged on an ongoing basis -UDS ordered  Borderline personality disorder ([place])- (present on admission) -Patient was either very somnolent or resistant to interact (or both) while in the ER and also made some accusing statements to nursing -Continue Abilify, Neurontin, and prn hydroxyzine -Patient reported to nephrology that she is homeless but did not report this to me at the time of admission   Chronic hypertension- (present on admission) -Continue Cozaar -She was started on a labetalol infusion in the ER and it appears that this can be discontinued at this time -Anticipate ongoing improvement with HD    Advance Care Planning:   Code Status: Full Code   Consults: Nephrology; [facility] team  Family Communication: None present and she did not request that I contact anyone at the time of admission  Severity of Illness: The appropriate patient status for this patient is OBSERVATION. Observation status is judged to be reasonable and necessary in order to provide the required intensity of service to ensure the patient's safety. The patient's presenting symptoms, physical exam findings, and initial radiographic and laboratory data in the context of their medical condition is felt to place them at decreased risk for further clinical deterioration. Furthermore, it is anticipated that the patient will be medically stable for discharge from the hospital within 2 midnights of admission.   Author: Karmen Bongo, MD 12/23/2021 5:06 PM  For on call review [email or web address].

## 2021-12-23 NOTE — ED Notes (Signed)
Patient is crying out loud "Can I please get something for my chest pain." This tech was able to calm her down while getting vitals and patient began screaming again.

## 2021-12-23 NOTE — Assessment & Plan Note (Signed)
-  Prior BMI was 38.7 -Current weight requested -Weight loss should be encouraged -Outpatient PCP/bariatric medicine/bariatric surgery f/u encouraged

## 2021-12-23 NOTE — Assessment & Plan Note (Addendum)
-  Flat troponin -Likely related to demand ischemia -Chest pain has been present for a week and is improved with BP control in the ER - unlikely ACS -Cardiology consulted and has recommended echo

## 2021-12-23 NOTE — ED Notes (Signed)
Called x 2 NO answer 

## 2021-12-23 NOTE — ED Notes (Signed)
Pt laying on the floor in triage yelling "please help me", RN asked pt to get up off the floor, pt yelled "my chest hurts and you're doing nothing about it and the floor helps". RN explained we had drawn labs, done an EKG, we had medication for her to take and we were waiting for a chest xray.

## 2021-12-23 NOTE — Consult Note (Addendum)
The patient has been seen in conjunction with  Reino Bellis, NP. All aspects of care have been considered and discussed. The patient has been personally interviewed, examined, and all clinical data has been reviewed.  Acute on chronic diastolic heart failure.  Rule out systolic component.  Heart failure secondary to volume overload in the setting of hemodialysis dependent renal failure and absence of hemodialysis for greater than 3 weeks. Elevated cardiac biomarkers: Flat troponin I and BNP related to supply demand mismatch, from impaired vasodilator reserve and high filling pressures related to volume overload ([place]).  RECOMMENDATIONS: 2D Doppler echocardiogram; dialysis; rule out pericardial involvement; no ischemic evaluation unless regional wall motion abnormality or significant decline in overall LV function.   Cardiology Consultation:   Patient ID: Del Wiseman MRN: [id number]; DOB: 1996/03/16  Admit date: 12/23/2021 Date of Consult: 12/23/2021  PCP:  [name], MD   [facility] Providers Cardiologist:  None    New  Patient Profile:   Rasheida Broden is a 26 y.o. female with a hx of ESRD on HD (TTS), HTN, Asthma who is being seen 12/23/2021 for the evaluation of chest pain at the request of Dr. Lorin Mercy.  History of Present Illness:   Ms. Hattery is a 26 yo female with PMH noted above. She has never seen a cardiologist in the past. She has been on HD for the past several years in the setting of ESRD 2/2 nephrotic syndrome.  Presents to the ED with sharp centralized chest pain for the past week. Apparently she is homeless and transportation issues causing her to miss HD for over 3 weeks. Normally goes TTS. Also complained of shortness of breath as well as orthopnea.   In the ED his labs showed Na+ 142, K+ 4.9, BUN 123, Cr 22, BNP 690, hsTn 89>>95, CBC 11.2, Hgb 10.5. CXR negative. EKG showed SR 74 bpm, TWI in lead III. Systolic Bps up to 998 in the ED.  She was started on IV nitro and admitted to IM for further management. Seen by nephrology with plans for HD. She has been transitioned to IV labetalol drip with improvement in BP.   At the time of interview she is lethargic but does to verbal stimuli. Did receive 4mg  morphine earlier. Denies any active chest pain. Planned for HD this afternoon.    Past Medical History:  Diagnosis Date   Asthma    as a child, no problem as an adult, no inhaler   Complication of anesthesia    woke up before tube removed, 1 time fought nurses   ESRD on hemodialysis [facility])    M-W-F   History of borderline personality disorder    Hypertension    diagnosed as child; stopped meds at 74 yo   Insomnia    Neuromuscular disorder ()    peripheral neuropathy   PTSD (post-traumatic stress disorder)     Past Surgical History:  Procedure Laterality Date   AV FISTULA PLACEMENT Left 10/18/2020   Procedure: LEFT ARM ARTERIOVENOUS (AV) FISTULA CREATION;  Surgeon: Serafina Mitchell, MD;  Location: [facility];  Service: Vascular;  Laterality: Left;   CHOLECYSTECTOMY     extraction of wisdom teeth     FISTULA SUPERFICIALIZATION Left 02/13/2021   Procedure: LEFT BRACHIOCEPHALIC ARTERIOVENOUS FISTULA SUPERFICIALIZATION;  Surgeon: Serafina Mitchell, MD;  Location: [place];  Service: Vascular;  Laterality: Left;   RENAL BIOPSY     x 2   TUNNELED VENOUS CATHETER PLACEMENT  02/11/2021   [facility]     Home Medications:  Prior to Admission medications   Medication Sig Start Date End Date Taking? Authorizing Provider  acetaminophen (TYLENOL) 650 MG CR tablet Take 650-1,300 mg by mouth every 8 (eight) hours as needed for pain.    [provider]  ARIPiprazole (ABILIFY) 5 MG tablet Take 1 tablet (5 mg total) by mouth daily. 08/10/21   Ajibola, Ene A, NP  B Complex-C-Folic Acid (RENAL VITAMIN) 0.8 MG TABS Take 1 tablet by mouth daily. 10/29/21   [provider]  Blood Pressure Monitoring (BLOOD PRESSURE  MONITOR 7) DEVI 1 Units by Does not apply route daily. Measure blood pressure daily 02/29/20   [name], MD  calcitRIOL (ROCALTROL) 0.25 MCG capsule Take 0.25 mcg by mouth every Monday, Wednesday, and Friday.  09/25/20   [provider]  calcium acetate (PHOSLO) 667 MG capsule Take 667 mg by mouth 3 (three) times daily. 07/03/21   [provider]  gabapentin (NEURONTIN) 600 MG tablet Take 0.5 tablets (300 mg total) by mouth 3 (three) times daily. 10/31/21   [name], MD  hydrOXYzine (VISTARIL) 25 MG capsule Take 1 capsule (25 mg total) by mouth every 8 (eight) hours as needed. 08/11/21   [name], MD  lidocaine-prilocaine (EMLA) cream Apply 1 application topically See admin instructions. Prior to dialysis 03/06/21   [provider]  losartan (COZAAR) 50 MG tablet Take 50 mg by mouth daily. 12/11/21   [provider]  omeprazole (PRILOSEC) 20 MG capsule Take 1 capsule (20 mg total) by mouth daily. 08/11/21   [name], MD  ondansetron (ZOFRAN ODT) 4 MG disintegrating tablet Take 1 tablet (4 mg total) by mouth every 8 (eight) hours as needed for nausea or vomiting. 03/18/21   Long, Wonda Olds, MD  torsemide (DEMADEX) 100 MG tablet Take 100 mg by mouth every morning. 12/11/21   [provider]    Inpatient Medications: Scheduled Meds:  [START ON 12/25/2021] calcitRIOL  1.5 mcg Oral Q T,Th,Sa-HD   [START ON 12/24/2021] Chlorhexidine Gluconate Cloth  6 each Topical Q0600   Continuous Infusions:  labetalol (NORMODYNE) infusion 5 mg/mL 0.5 mg/min (12/23/21 1241)   PRN Meds:   Allergies:    Allergies  Allergen Reactions   Prozac [Fluoxetine Hcl] Other (See Comments)    Panic attack    Wellbutrin [Bupropion] Other (See Comments)    Panic attack   Prednisone Other (See Comments)    Pt states that this med caused pancreatitis.     Social History:   Social History   Socioeconomic History   Marital status: Soil scientist     Spouse name: Not on file   Number of children: 1   Years of education: Not on file   Highest education level: 12th grade  Occupational History   Occupation: unemployed  Tobacco Use   Smoking status: Every Day    Packs/day: 0.25    Years: 8.00    Pack years: 2.00    Types: Cigarettes   Smokeless tobacco: Never  Vaping Use   Vaping Use: Never used  Substance and Sexual Activity   Alcohol use: Not Currently    Comment: "maybe 3 a month."- liquor   Drug use: Not Currently    Types: Marijuana, Cocaine    Comment: 10/17/20- no cocaine, marijunia -last time 02/12/21   Sexual activity: Not Currently    Birth control/protection: None  Other Topics Concern   Not on file  Social History  Narrative   Not on file   Social Determinants of Health   Financial Resource Strain: Not on file  Food Insecurity: Not on file  Transportation Needs: Not on file  Physical Activity: Not on file  Stress: Not on file  Social Connections: Not on file  Intimate Partner Violence: Not on file    Family History:    Family History  Adopted: Yes  Problem Relation Age of Onset   Diabetes Other    Hypertension Other      ROS:  Please see the history of present illness.   All other ROS reviewed and negative.     Physical Exam/Data:   Vitals:   12/23/21 1351 12/23/21 1518 12/23/21 1545 12/23/21 1600  BP: (!) 173/97 (!) 154/78 (!) 141/82 (!) 143/85  Pulse: 72 69 75   Resp: 16 19 17 19   Temp:   99.3 F (37.4 C)   TempSrc:   Oral   SpO2: 100% 100% 98%     Intake/Output Summary (Last 24 hours) at 12/23/2021 1609 Last data filed at 12/23/2021 1210 Gross per 24 hour  Intake 1.06 ml  Output --  Net 1.06 ml   Last 3 Weights 12/23/2021 08/11/2021 08/02/2021  Weight (lbs) (No Data) 254 lb 9.6 oz 246 lb 7.6 oz  Weight (kg) (No Data) 115.486 kg 111.8 kg  Some encounter information is confidential and restricted. Go to Review Flowsheets activity to see all data.     There is no height or weight on  file to calculate BMI.  General:  Obese young female HEENT: normal Neck: + JVD Vascular: No carotid bruits; Distal pulses 2+ bilaterally Cardiac:  normal S1, S2; RRR; no murmur  Lungs:  Diminished in bases Abd: soft, nontender, no hepatomegaly  Ext: no edema Musculoskeletal:  No deformities, BUE and BLE strength normal and equal. L AVF Skin: warm and dry  Neuro:  CNs 2-12 intact, no focal abnormalities noted Psych:  Normal affect   EKG:  The EKG was personally reviewed and demonstrates:  SR, 74 bpm TWI in lead III  Relevant CV Studies:  N/a   Laboratory Data:  High Sensitivity Troponin:   Recent Labs  Lab 12/23/21 0416 12/23/21 0605  TROPONINIHS 89* 95*     Chemistry Recent Labs  Lab 12/23/21 0416  NA 142  K 4.9  CL 108  CO2 15*  GLUCOSE 99  BUN 123*  CREATININE 22.88*  CALCIUM 7.4*  GFRNONAA 2*  ANIONGAP 19*    No results for input(s): PROT, ALBUMIN, AST, ALT, ALKPHOS, BILITOT in the last 168 hours. Lipids No results for input(s): CHOL, TRIG, HDL, LABVLDL, LDLCALC, CHOLHDL in the last 168 hours.  Hematology Recent Labs  Lab 12/23/21 0416  WBC 11.2*  RBC 3.43*  HGB 10.5*  HCT 32.2*  MCV 93.9  MCH 30.6  MCHC 32.6  RDW 14.5  PLT 167   Thyroid No results for input(s): TSH, FREET4 in the last 168 hours.  BNP Recent Labs  Lab 12/23/21 0416  BNP 690.2*    DDimer No results for input(s): DDIMER in the last 168 hours.   Radiology/Studies:  DG Chest 2 View  Result Date: 12/23/2021 CLINICAL DATA:  Chest pain, dyspnea EXAM: CHEST - 2 VIEW COMPARISON:  06/20/2021 FINDINGS: Minimal right basilar scarring again noted. The lungs are otherwise clear. No pneumothorax or pleural effusion. Cardiac size within normal limits. Pulmonary vascularity is normal. No acute bone abnormality. IMPRESSION: No active cardiopulmonary disease. Electronically Signed   By: Cassandria Anger  Christa See M.D.   On: 12/23/2021 05:02     Assessment and Plan:   Edith Groleau is a 26 y.o.  female with a hx of ESRD on HD (TTS), HTN, Asthma who is being seen 12/23/2021 for the evaluation of chest pain at the request of Dr. Lorin Mercy.  Chest pain: Sharp, seems pleuritic type chest pain. In the setting of missing HD. hsTn 89>>95. EKG does have new TWI in lead III. Received IV morphine with improvement. She does have RFs -- check echo, further recommendations pending results -- check lipids, Hgb A1c for risk statification  ESRD on HD: TTS when she goes, has missed sessions for the past 3 weeks -- management per nephrology  HTN crisis: has not been meds recently -- currently on IV labetalol gtt -- would recommend transition/wean to oral BB, likely coreg once completed HD. Reassess BP once volume removal  Risk Assessment/Risk Scores:      :[id number]}   HEAR Score (for undifferentiated chest pain):  HEAR Score: 2  For questions or updates, please contact [place] Please consult [email or web address] for contact info under    Signed, Reino Bellis, NP  12/23/2021 4:09 PM

## 2021-12-23 NOTE — Assessment & Plan Note (Signed)
-  Patient on chronic TTS HD -Nephrology prn order set utilized -Nephrology is aware that patient will need HD

## 2021-12-23 NOTE — Assessment & Plan Note (Signed)
-  Encourage cessation.   °-This was discussed with the patient and should be reviewed on an ongoing basis.   °-Patch ordered at patient request. °

## 2021-12-23 NOTE — ED Provider Notes (Signed)
[facility] EMERGENCY DEPARTMENT Provider Note   CSN: [id number] Arrival date & time: 12/23/21  0348     History  Chief Complaint  Patient presents with   Chest Pain    Natasha Long is a 26 y.o. female with significant medical history as listed below who presents to the emergency department today for a 1 week history of chest pain.  Patient states she is having central sharp chest pain that has been constant over the last week.  Patient is a dialysis patient and goes Tuesday, Thursday, and Saturday.  She states she has not had dialysis in over 3 weeks because she is having transportation issues.  She reports associated shortness of breath.  Pain is worse with respirations.  She denies any cough, congestion, nasal congestion, fever, chills.  She does report associated orthopnea.   Patient Active Problem List   Diagnosis Date Noted   Hypervolemia associated with renal insufficiency 12/23/2021   Tobacco use 08/11/2021   Nipple discharge 08/11/2021   Breast pain 08/11/2021   Exposure to sexually transmitted disease (STD) 12/45/8099   Complication of vascular dialysis catheter 02/12/2021   Moderate protein-calorie malnutrition ([place]) 01/27/2021   Allergy, unspecified, initial encounter 01/23/2021   Anemia in chronic kidney disease 01/23/2021   ESRD on dialysis ([place]) 01/23/2021   Iron deficiency anemia, unspecified 01/23/2021   Other specified coagulation defects ([place]) 01/23/2021   Secondary hyperparathyroidism of renal origin ([place]) 01/23/2021   Anemia    Suicidal ideations    Influenza vaccine refused 12/19/2020   Morbid obesity ([place]) 01/30/2020   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   Cocaine use, 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[place]) 01/30/2020   Affective psychosis, bipolar ([place]) 06/27/2019   Borderline personality disorder ([place]) 11/16/2018   Moderate cannabis use disorder ([place]) 11/16/2018   Severe recurrent major depression without  psychotic features ([place]) 11/15/2018   Chronic hypertension 04/04/2018   PTSD (post-traumatic stress disorder) 04/01/2018      Chest Pain     Home Medications Prior to Admission medications   Medication Sig Start Date End Date Taking? Authorizing Provider  acetaminophen (TYLENOL) 650 MG CR tablet Take 650-1,300 mg by mouth every 8 (eight) hours as needed for pain.    [provider]  ARIPiprazole (ABILIFY) 5 MG tablet Take 1 tablet (5 mg total) by mouth daily. 08/10/21   Aj[place]ola, Rosezella [place] A, NP  Blood Pressure Monitoring (BLOOD PRESSURE MONITOR 7) DEVI 1 Units by Does not apply route daily. Measure blood pressure daily 02/29/20   [name], MD  calcitRIOL (ROCALTROL) 0.25 MCG capsule Take 0.25 mcg by mouth every Monday, Wednesday, and Friday.  09/25/20   [provider]  calcium acetate (PHOSLO) 667 MG capsule Take 667 mg by mouth 3 (three) times daily. 07/03/21   [provider]  gabapentin (NEURONTIN) 100 MG capsule Take 1 capsule (100 mg total) by mouth 2 (two) times daily. Patient not taking: Reported on 08/11/2021 01/17/21 08/11/21  Matilde Haymaker, MD  gabapentin (NEURONTIN) 600 MG tablet Take 0.5 tablets (300 mg total) by mouth 3 (three) times daily. 10/31/21   [name], MD  hydrOXYzine (VISTARIL) 25 MG capsule Take 1 capsule (25 mg total) by mouth every 8 (eight) hours as needed. 08/11/21   [name], MD  lidocaine-prilocaine (EMLA) cream Apply 1 application topically See admin instructions. Prior to dialysis 03/06/21   [provider]  losartan (COZAAR) 25 MG tablet Take 25 mg by mouth daily. 09/25/20   [provider]  omeprazole (PRILOSEC) 20 MG capsule Take 1 capsule (20 mg total) by mouth daily. 08/11/21   [name], MD  ondansetron (ZOFRAN ODT) 4 MG disintegrating tablet Take 1 tablet (4 mg total) by mouth every 8 (eight) hours as needed for nausea or vomiting. 03/18/21   Long, Wonda Olds, MD      Allergies    Prozac  [fluoxetine hcl], Wellbutrin [bupropion], and Prednisone    Review of Systems   Review of Systems  Cardiovascular:  Positive for chest pain.  All other systems reviewed and are negative.  Physical Exam Updated Vital Signs BP (!) 202/105    Pulse 81    Temp 98.6 F (37 C)    Resp (!) 27    SpO2 100%  Physical Exam Vitals and nursing note reviewed.  Constitutional:      General: She is not in acute distress.    Appearance: Normal appearance.  HENT:     Head: Normocephalic and atraumatic.  Eyes:     General:        Right eye: No discharge.        Left eye: No discharge.  Cardiovascular:     Comments: Regular rate and rhythm.  S1/S2 are distinct without any evidence of murmur, rubs, or gallops.  Radial pulses are 2+ bilaterally.  Dorsalis pedis pulses are 2+ bilaterally.  No evidence of pedal edema. Pulmonary:     Comments: Clear to auscultation bilaterally.  Normal effort.  No respiratory distress.  No evidence of wheezes, rales, or rhonchi heard throughout. Abdominal:     General: Abdomen is flat. Bowel sounds are normal. There is no distension.     Tenderness: There is no abdominal tenderness. There is no guarding or rebound.  Musculoskeletal:        General: Normal range of motion.     Cervical back: Neck supple.  Skin:    General: Skin is warm and dry.     Findings: No rash.  Neurological:     General: No focal deficit present.     Mental Status: She is alert.  Psychiatric:        Mood and Affect: Mood normal.        Behavior: Behavior normal.    ED Results / Procedures / Treatments   Labs (all labs ordered are listed, but only abnormal results are displayed) Labs Reviewed  CBC WITH DIFFERENTIAL/PLATELET - Abnormal; Notable for the following components:      Result Value   WBC 11.2 (*)    RBC 3.43 (*)    Hemoglobin 10.5 (*)    HCT 32.2 (*)    Neutro Abs 9.1 (*)    All other components within normal limits  BASIC METABOLIC PANEL - Abnormal; Notable for the  following components:   CO2 15 (*)    BUN 123 (*)    Creatinine, Ser 22.88 (*)    Calcium 7.4 (*)    GFR, Estimated 2 (*)    Anion gap 19 (*)    All other components within normal limits  TROPONIN I (HIGH SENSITIVITY) - Abnormal; Notable for the following components:   Troponin I (High Sensitivity) 89 (*)    All other components within normal limits  TROPONIN I (HIGH SENSITIVITY) - Abnormal; Notable for the following components:   Troponin I (High Sensitivity) 95 (*)    All other components within normal limits  RESP PANEL BY RT-PCR (FLU A&B, COVID) ARPGX2  BRAIN NATRIURETIC PEPTIDE  I-STAT BETA  HCG BLOOD, ED ([facility], [facility], [facility] ONLY)    EKG None  Radiology DG Chest 2 View  Result Date: 12/23/2021 CLINICAL DATA:  Chest pain, dyspnea EXAM: CHEST - 2 VIEW COMPARISON:  06/20/2021 FINDINGS: Minimal right basilar scarring again noted. The lungs are otherwise clear. No pneumothorax or pleural effusion. Cardiac size within normal limits. Pulmonary vascularity is normal. No acute bone abnormality. IMPRESSION: No active cardiopulmonary disease. Electronically Signed   By: Fidela Salisbury M.D.   On: 12/23/2021 05:02    Procedures Procedures  Cardiac telemetry reveals normal sinus rhythm with profound hypertension.  Normal rate.  She is oxygenating well.  Medications Ordered in ED Medications  labetalol (NORMODYNE) infusion 5 mg/mL (has no administration in time range)  acetaminophen (TYLENOL) tablet 650 mg (650 mg Oral Given 12/23/21 0422)  morphine 4 MG/ML injection 4 mg (4 mg Intravenous Given 12/23/21 1121)    ED Course/ Medical Decision Making/ A&P Clinical Course as of 12/23/21 1211  Tue Dec 23, 2021  1051 I spoke Trish with Cardiology who agrees to consult on the patient.  [CF]  1141 Spoke with Dr. Jonnie Finner with nephrology who recommends IV labetalol and agrees to consult on the patient.  [CF]  1210 I spoke with Dr. Lorin Mercy who agrees to admit the patient. [CF]    Clinical Course User  Index [CF] Hendricks Limes, PA-C                           Medical Decision Making Amount and/or Complexity of Data Reviewed Labs: ordered.  Risk Prescription drug management.   Kathyjo Carrozza is a 26 y.o. female with significant morbidities impacting her care to include end-stage renal disease on dialysis who presents to the emergency department with chest pain and shortness of breath.  Given the fact that the patient has not had any dialysis in over 3 weeks I am concerned for possible demand ischemia, ACS is also a concern, heart failure is also a concern.  Initial work-up was ordered in triage with the patient then waited for approximately 5 to 6 hours in the waiting room.  I personally reviewed the labs and imaging.  CBC did show leukocytosis and chronic ongoing anemia.  Pregnancy was negative.  BMP did show significantly elevated creatinine and BUN in the setting of end-stage renal disease.  Initial and delta troponin were elevated.  I added on a respiratory panel is pending.  I also added on a BNP is pending.  I personally reviewed the chest x-ray which was negative.  I do agree with the radiologist interpretation.  EKG did not show any signs of active ischemia at this time.  Patient is still complaining of severe central sharp nonradiating chest pain.  I spoke with cardiology who agrees to consult on the patient.  I will treat her pain with morphine and reevaluate.  I do believe that the troponin elevation is likely due to demand and volume overload.  On reevaluation, patient is currently sleeping after morphine.  I had to add on nitroglycerin drip to treat for hypertensive emergency.  Will change to labetalol per nephrology instructions.  Nephrology agrees to consult on the patient and will put her on the list for dialysis.  Given the clinical scenario, I do believe that the patient would benefit from further evaluation in the hospital.  I will work to get her admitted with  hospitalist service.  I spoke with Dr. Lorin Mercy who agrees to admit the  patient.  Final Clinical Impression(s) / ED Diagnoses Final diagnoses:  Chest pain, unspecified type    Rx / DC Orders ED Discharge Orders     None         Cherrie Gauze 12/23/21 1212    Regan Lemming, MD 12/23/21 1757

## 2021-12-23 NOTE — ED Notes (Signed)
Patient placed on 2L of O2 for comfort. Triage RN and charge RN are aware.
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
## 2021-12-23 NOTE — Assessment & Plan Note (Addendum)
-  Continue Cozaar -She was started on a labetalol infusion in the ER and it appears that this can be discontinued at this time -Anticipate ongoing improvement with HD

## 2021-12-23 NOTE — Assessment & Plan Note (Signed)
-  Cessation encouraged; this should be encouraged on an ongoing basis -UDS ordered

## 2021-12-23 NOTE — Assessment & Plan Note (Addendum)
-  Patient was either very somnolent or resistant to interact (or both) while in the ER and also made some accusing statements to nursing -Continue Abilify, Neurontin, and prn hydroxyzine -Patient reported to nephrology that she is homeless but did not report this to me at the time of admission

## 2021-12-23 NOTE — Assessment & Plan Note (Addendum)
-  Patient with h/o ESRD but has not been recently attending HD sessions -Metabolic acidosis, volume overload noted on presentation -She reports that this is due to "transportation issues" but acknowledges that her center does not need to be changed due to location -She is likely to benefit from serial HD both today and tomorrow and may be appropriate for d/c to home tomorrow after HD -Ongoing compliance should be encouraged

## 2021-12-23 NOTE — ED Notes (Signed)
When flushing this patient's IV, patient complained of burning to site and jerked her hand away, almost removing catheter. Patency checked again by this RN and patient yelling at this RN "I'm so tired of all y'all racists around here!!" Advised patient that no one is being racist and we are, in fact, trying to help her.

## 2021-12-24 ENCOUNTER — Observation Stay (HOSPITAL_BASED_OUTPATIENT_CLINIC_OR_DEPARTMENT_OTHER): Payer: Medicaid Other

## 2021-12-24 DIAGNOSIS — E8770 Fluid overload, unspecified: Secondary | ICD-10-CM | POA: Diagnosis not present

## 2021-12-24 DIAGNOSIS — R778 Other specified abnormalities of plasma proteins: Secondary | ICD-10-CM | POA: Diagnosis not present

## 2021-12-24 DIAGNOSIS — N186 End stage renal disease: Secondary | ICD-10-CM

## 2021-12-24 DIAGNOSIS — Z992 Dependence on renal dialysis: Secondary | ICD-10-CM

## 2021-12-24 DIAGNOSIS — F1494 Cocaine use, unspecified with cocaine-induced mood disorder: Secondary | ICD-10-CM

## 2021-12-24 DIAGNOSIS — R079 Chest pain, unspecified: Secondary | ICD-10-CM

## 2021-12-24 DIAGNOSIS — N289 Disorder of kidney and ureter, unspecified: Secondary | ICD-10-CM | POA: Diagnosis not present

## 2021-12-24 DIAGNOSIS — I10 Essential (primary) hypertension: Secondary | ICD-10-CM

## 2021-12-24 LAB — HEPATITIS B SURFACE ANTIBODY, QUANTITATIVE: Hep B S AB Quant (Post): >1000 m[IU]/mL (ref >9.9)

## 2021-12-24 LAB — BASIC METABOLIC PANEL
Anion gap: 13 (ref 5–15)
BUN: 81 mg/dL — ABNORMAL HIGH (ref 6–20)
CO2: 18 mmol/L — ABNORMAL LOW (ref 22–32)
Calcium: 7.9 mg/dL — ABNORMAL LOW (ref 8.9–10.3)
Chloride: 104 mmol/L (ref 98–111)
Creatinine, Ser: 17.25 mg/dL — ABNORMAL HIGH (ref 0.44–1.00)
GFR, Estimated: 3 mL/min — ABNORMAL LOW (ref >60)
Glucose, Bld: 114 mg/dL — ABNORMAL HIGH (ref 70–99)
Potassium: 4.3 mmol/L (ref 3.5–5.1)
Sodium: 135 mmol/L (ref 135–145)

## 2021-12-24 LAB — HEPATITIS B SURFACE ANTIGEN: Hepatitis B Surface Ag: NONREACTIVE

## 2021-12-24 LAB — HEMOGLOBIN A1C
Hgb A1c MFr Bld: 5.4 % (ref 4.8–5.6)
Mean Plasma Glucose: 108 mg/dL

## 2021-12-24 LAB — CBC
HCT: 32 % — ABNORMAL LOW (ref 36.0–46.0)
Hemoglobin: 10.1 g/dL — ABNORMAL LOW (ref 12.0–15.0)
MCH: 30.1 pg (ref 26.0–34.0)
MCHC: 31.6 g/dL (ref 30.0–36.0)
MCV: 95.5 fL (ref 80.0–100.0)
Platelets: 152 10*3/uL (ref 150–400)
RBC: 3.35 MIL/uL — ABNORMAL LOW (ref 3.87–5.11)
RDW: 14.4 % (ref 11.5–15.5)
WBC: 8.4 10*3/uL (ref 4.0–10.5)
nRBC: 0 % (ref 0.0–0.2)

## 2021-12-24 LAB — LIPID PANEL
Cholesterol: 111 mg/dL (ref 0–200)
HDL: 39 mg/dL — ABNORMAL LOW (ref >40)
LDL Cholesterol: 54 mg/dL (ref 0–99)
Total CHOL/HDL Ratio: 2.8 RATIO
Triglycerides: 90 mg/dL (ref <150)
VLDL: 18 mg/dL (ref 0–40)

## 2021-12-24 LAB — ECHOCARDIOGRAM COMPLETE
AR max vel: 2.23 cm2
AV Area VTI: 2.43 cm2
AV Area mean vel: 2.43 cm2
AV Mean grad: 12 mmHg
AV Peak grad: 20.6 mmHg
Ao pk vel: 2.27 m/s
Area-P 1/2: 2.28 cm2
S' Lateral: 3.9 cm

## 2021-12-24 LAB — HEPATITIS B DNA, ULTRAQUANTITATIVE, PCR
HBV DNA SERPL PCR-ACNC: NOT DETECTED IU/mL
HBV DNA SERPL PCR-LOG IU: UNDETERMINED log10 IU/mL

## 2021-12-24 LAB — MAGNESIUM: Magnesium: 2.4 mg/dL (ref 1.7–2.4)

## 2021-12-24 MED ORDER — TRAMADOL HCL 50 MG PO TABS
25.0000 mg | ORAL_TABLET | Freq: Three times a day (TID) | ORAL | Status: DC | PRN
  Administered 2021-12-24: 10:00:00 25 mg via ORAL
  Filled 2021-12-24: qty 1

## 2021-12-24 MED ORDER — LORAZEPAM 2 MG/ML IJ SOLN
0.5000 mg | Freq: Four times a day (QID) | INTRAMUSCULAR | Status: DC | PRN
  Administered 2021-12-24 – 2021-12-26 (×6): 1 mg via INTRAVENOUS
  Filled 2021-12-24 (×6): qty 1

## 2021-12-24 MED ORDER — CALCIUM ACETATE (PHOS BINDER) 667 MG PO CAPS
667.0000 mg | ORAL_CAPSULE | Freq: Three times a day (TID) | ORAL | Status: DC
  Administered 2021-12-25 – 2021-12-26 (×5): 667 mg via ORAL
  Filled 2021-12-24 (×4): qty 1

## 2021-12-24 NOTE — Progress Notes (Signed)
PROGRESS NOTE    Natasha Long  JHE:[id number] DOB: Mar 01, 1996 DOA: 12/23/2021 PCP: [name], MD   Brief Narrative: 26 year old with past medical history significant for borderline Personality Disorder, hypertension, PTSD, ESRD on hemodialysis TTS presented with chest pain.  Patient was quite somnolent at the time of evaluation by admitting physician.  She missed hemodialysis for some time due to transportation issues.  Presented with chest pain for 1 week.  Missing hemodialysis due to transportation issues.  She is homeless.  Troponin mildly elevated 89----95.  She was a started on labetalol infusion, treating for hypertensive crisis.  Cardiology and nephrology consulted and following    Assessment & Plan:   Principal Problem:   Hypervolemia associated with renal insufficiency Active Problems:   Chronic hypertension   Borderline personality disorder (HCC)   Moderate cannabis use disorder (HCC)   Morbid obesity (HCC)   Cocaine use, 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[place])   ESRD on dialysis ([place])   Tobacco use   Elevated troponin  1-Acute on chronic Diastolic HF; Hypervolemia associated with Renal Insufficiency: -She has not been recently attending hemodialysis sessions. -Nephrology consulted, she had hemodialysis 1/24.  2-Elevation of troponin: Cardiology consulted and recommended echo. Plan to control blood pressure Likely demand ischemia from hypertension  Tobacco use: Encourage cessation  ESRD on hemodialysis TTS Nephrology consulted.  Morbid obesity: BMI 38 Needs Lifestyle modifications  Moderate cannabis use disorder: Encourage cessation  Borderline personality disorder Continue with Abilify-Neurontin As needed hydroxyzine He was agitated in the ED, low-dose Ativan as needed ordered.  Psych has been consulted  Chronic hypertension: Continue with Cozaar. She was transiently on labetalol infusion.   Estimated body mass index is  38.71 kg/m as calculated from the following:   Height as of 06/20/21: 5\' 8"  (1.727 m).   Weight as of 08/11/21: 115.5 kg.   DVT prophylaxis: Heparin Code Status: Full code Family Communication: Care discussed with patient Disposition Plan:  Status is: Observation  The patient remains OBS appropriate and will d/c before 2 midnights.  Patient is homeless, Education officer, museum consulted. Needs assistance with transpiration for HD as well.       Consultants:  Nephrology  Psych   Procedures:  HD  Antimicrobials:    Subjective: She report chest pain on and off.  She is homeless   Objective: Vitals:   12/23/21 1730 12/23/21 1830 12/23/21 1840 12/24/21 0538  BP: (!) 142/65 138/84 (!) 160/48 (!) 155/54  Pulse:  71  84  Resp: 20 17 17 15   Temp:    98.8 F (37.1 C)  TempSrc:    Oral  SpO2:    97%    Intake/Output Summary (Last 24 hours) at 12/24/2021 1019 Last data filed at 12/23/2021 1933 Gross per 24 hour  Intake 238.06 ml  Output 1476 ml  Net -1237.94 ml   Filed Weights    Examination:  General exam: Appears calm and comfortable  Respiratory system: Clear to auscultation. Respiratory effort normal. Cardiovascular system: S1 & S2 heard, RRR.  Gastrointestinal system: Abdomen is nondistended, soft and nontender. No organomegaly or masses felt. Normal bowel sounds heard. Central nervous system: Alert and oriented. No focal neurological deficits. Extremities: Symmetric 5 x 5 power.   Data Reviewed: I have personally reviewed following labs and imaging studies  CBC: Recent Labs  Lab 12/23/21 0416  WBC 11.2*  NEUTROABS 9.1*  HGB 10.5*  HCT 32.2*  MCV 93.9  PLT 185   Basic Metabolic Panel: Recent Labs  Lab 12/23/21  0416  NA 142  K 4.9  CL 108  CO2 15*  GLUCOSE 99  BUN 123*  CREATININE 22.88*  CALCIUM 7.4*   GFR: CrCl cannot be calculated (Unknown ideal weight.). Liver Function Tests: No results for input(s): AST, ALT, ALKPHOS, BILITOT, PROT,  ALBUMIN in the last 168 hours. No results for input(s): LIPASE, AMYLASE in the last 168 hours. No results for input(s): AMMONIA in the last 168 hours. Coagulation Profile: No results for input(s): INR, PROTIME in the last 168 hours. Cardiac Enzymes: No results for input(s): CKTOTAL, CKMB, CKMBINDEX, TROPONINI in the last 168 hours. BNP (last 3 results) No results for input(s): PROBNP in the last 8760 hours. HbA1C: No results for input(s): HGBA1C in the last 72 hours. CBG: No results for input(s): GLUCAP in the last 168 hours. Lipid Profile: No results for input(s): CHOL, HDL, LDLCALC, TRIG, CHOLHDL, LDLDIRECT in the last 72 hours. Thyroid Function Tests: No results for input(s): TSH, T4TOTAL, FREET4, T3FREE, THYROIDAB in the last 72 hours. Anemia Panel: No results for input(s): VITAMINB12, FOLATE, FERRITIN, TIBC, IRON, RETICCTPCT in the last 72 hours. Sepsis Labs: No results for input(s): PROCALCITON, LATICACIDVEN in the last 168 hours.  Recent Results (from the past 240 hour(s))  Resp Panel by RT-PCR (Flu A&B, Covid) Nasopharyngeal Swab     Status: None   Collection Time: 12/23/21 12:45 PM   Specimen: Nasopharyngeal Swab; Nasopharyngeal(NP) swabs in vial transport medium  Result Value Ref Range Status   SARS Coronavirus 2 by RT PCR NEGATIVE NEGATIVE Final    Comment: (NOTE) SARS-CoV-2 target nucleic acids are NOT DETECTED.  The SARS-CoV-2 RNA is generally detectable in upper respiratory specimens during the acute phase of infection. The lowest concentration of SARS-CoV-2 viral copies this assay can detect is 138 copies/mL. A negative result does not preclude SARS-Cov-2 infection and should not be used as the sole basis for treatment or other patient management decisions. A negative result may occur with  improper specimen collection/handling, submission of specimen other than nasopharyngeal swab, presence of viral mutation(s) within the areas targeted by this assay, and  inadequate number of viral copies(<138 copies/mL). A negative result must be combined with clinical observations, patient history, and epidemiological information. The expected result is Negative.  Fact Sheet for Patients:  EntrepreneurPulse.com.au  Fact Sheet for Healthcare Providers:  IncredibleEmployment.be  This test is no t yet approved or cleared by the [place] FDA and  has been authorized for detection and/or diagnosis of SARS-CoV-2 by FDA under an Emergency Use Authorization (EUA). This EUA will remain  in effect (meaning this test can be used) for the duration of the COVID-19 declaration under Section 564(b)(1) of the Act, 21 U.S.C.section 360bbb-3(b)(1), unless the authorization is terminated  or revoked sooner.       Influenza A by PCR NEGATIVE NEGATIVE Final   Influenza B by PCR NEGATIVE NEGATIVE Final    Comment: (NOTE) The Xpert Xpress SARS-CoV-2/FLU/RSV plus assay is intended as an aid in the diagnosis of influenza from Nasopharyngeal swab specimens and should not be used as a sole basis for treatment. Nasal washings and aspirates are unacceptable for Xpert Xpress SARS-CoV-2/FLU/RSV testing.  Fact Sheet for Patients: EntrepreneurPulse.com.au  Fact Sheet for Healthcare Providers: IncredibleEmployment.be  This test is not yet approved or cleared by the [place] FDA and has been authorized for detection and/or diagnosis of SARS-CoV-2 by FDA under an Emergency Use Authorization (EUA). This EUA will remain in effect (meaning this test can be used) for the duration  of the COVID-19 declaration under Section 564(b)(1) of the Act, 21 U.S.C. section 360bbb-3(b)(1), unless the authorization is terminated or revoked.  Performed at [facility], [place] [address], [place], [place] [address]          Radiology Studies: DG Chest 2 View  Result Date: 12/23/2021 CLINICAL DATA:   Chest pain, dyspnea EXAM: CHEST - 2 VIEW COMPARISON:  06/20/2021 FINDINGS: Minimal right basilar scarring again noted. The lungs are otherwise clear. No pneumothorax or pleural effusion. Cardiac size within normal limits. Pulmonary vascularity is normal. No acute bone abnormality. IMPRESSION: No active cardiopulmonary disease. Electronically Signed   By: Fidela Salisbury M.D.   On: 12/23/2021 05:02        Scheduled Meds:  ARIPiprazole  5 mg Oral Daily   [START ON 12/25/2021] calcitRIOL  1.5 mcg Oral Q T,Th,Sa-HD   calcium acetate  667 mg Oral TID   Chlorhexidine Gluconate Cloth  6 each Topical Q0600   gabapentin  300 mg Oral TID   heparin  5,000 Units Subcutaneous Q8H   hepatitis B immune globulin  0.06 mL/kg Intramuscular Once   losartan  50 mg Oral Daily   multivitamin  1 tablet Oral QHS   pantoprazole  40 mg Oral Daily   sodium chloride flush  3 mL Intravenous Q12H   Continuous Infusions:   LOS: 0 days    Time spent: 35 minutes     Youssouf Shipley A Chamar Broughton, MD [facility]   If 7PM-7AM, please contact night-coverage [email or web address]  12/24/2021, 10:19 AM

## 2021-12-24 NOTE — ED Notes (Signed)
MD at bedside. 

## 2021-12-24 NOTE — ED Notes (Signed)
Pt has asked RN for different task every 10 minutes. Pt is constantly yelling in [facility]. RN asked pt to stop yelling and she can not meet her needs right when she ask. Pt started being aggressive and yelling. RN called security. Pt was slamming bed against wall and is threatening staff. RN contacted MD for psych eval.

## 2021-12-24 NOTE — Progress Notes (Signed)
Echocardiogram 2D Echocardiogram has been performed.  Oneal Deputy Mouhamed Glassco RDCS 12/24/2021, 2:21 PM

## 2021-12-24 NOTE — ED Notes (Signed)
Pt showered
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
## 2021-12-24 NOTE — ED Notes (Signed)
Attempted to wake pt to give abilify. Pt rolled over and stated "no."

## 2021-12-24 NOTE — Progress Notes (Signed)
[facility] Progress Note  Subjective: pt seen in ED, no c/o's today  Vitals:   12/23/21 1730 12/23/21 1830 12/23/21 1840 12/24/21 0538  BP: (!) 142/65 138/84 (!) 160/48 (!) 155/54  Pulse:  71  84  Resp: 20 17 17 15   Temp:    98.8 F (37.1 C)  TempSrc:    Oral  SpO2:    97%    Exam:  alert, nad   no jvd  Chest cta bilat  Cor reg no RG  Abd soft ntnd no ascites   Ext no LE edema   Alert, NF, ox3    LUE aVF+bruit     Home meds include - abilify, phoslo 1 tid, neurontin 100 bid vs 300 tid, losartan 25 , prilosec, prns/ vits/ supps        OP HD: TTS [facility]   3.5h  350/1.5  112.5kg  2/2.5 P2 LUE AVF  Hep 5000   - mircera 100 ordered, not given yet   - calc 1.5 ug tiw     Assessment/ Plan: Uncont HTN - no vol overload on exam and pt cramped early on HD yesterday only 1L UF.  BP's better today.  ESRD - usual HD TTS. Missed 2 wks HD, pt homeless sleeping behind a commercial building. Had HD here yesterday. HD tomorrow.   Psych - has PTSD, bipolar, others per pt. On abilify and neurontin Anemia ckd - Hb 10.5, no esa needs MBD ckd - phos pending, Ca in range, cont vdra, binders     Rob Jayleigh Notarianni 12/24/2021, 3:21 PM   Recent Labs  Lab 12/23/21 0416  K 4.9  BUN 123*  CREATININE 22.88*  CALCIUM 7.4*  HGB 10.5*   Inpatient medications:  ARIPiprazole  5 mg Oral Daily   [START ON 12/25/2021] calcitRIOL  1.5 mcg Oral Q T,Th,Sa-HD   calcium acetate  667 mg Oral TID   Chlorhexidine Gluconate Cloth  6 each Topical Q0600   gabapentin  300 mg Oral TID   heparin  5,000 Units Subcutaneous Q8H   hepatitis B immune globulin  0.06 mL/kg Intramuscular Once   losartan  50 mg Oral Daily   multivitamin  1 tablet Oral QHS   pantoprazole  40 mg Oral Daily   sodium chloride flush  3 mL Intravenous Q12H    acetaminophen **OR** acetaminophen, calcium carbonate (dosed in mg elemental calcium), camphor-menthol **AND** hydrOXYzine, docusate sodium, feeding supplement (NEPRO  CARB STEADY), hydrALAZINE, LORazepam, nicotine, ondansetron **OR** ondansetron (ZOFRAN) IV, sorbitol, traMADol, zolpidem

## 2021-12-24 NOTE — ED Notes (Signed)
RN attempted to get labs---unsuccessful attempt. RN will ask phlebotomy

## 2021-12-24 NOTE — Plan of Care (Signed)
?  Problem: Clinical Measurements: ?Goal: Respiratory complications will improve ?Outcome: Progressing ?Goal: Cardiovascular complication will be avoided ?Outcome: Progressing ?  ?Problem: Activity: ?Goal: Risk for activity intolerance will decrease ?Outcome: Progressing ?  ?Problem: Pain Managment: ?Goal: General experience of comfort will improve ?Outcome: Progressing ?  ?Problem: Safety: ?Goal: Ability to remain free from injury will improve ?Outcome: Progressing ?  ?

## 2021-12-24 NOTE — ED Notes (Signed)
Patient transported to echo ?

## 2021-12-24 NOTE — ED Notes (Signed)
Patient is resting comfortably. 

## 2021-12-24 NOTE — Progress Notes (Addendum)
Spoke to patient while in ED H 20. Says chest and overall condition is better. Still some SOB and tightness when lying. Echo: briefly reviewed. Official interpretation to follow. Aorta okay. LVH. EF 55%. No pericardial effusion. Mild mitral and tricuspid regurgitation. Flat, mildly elevated troponin I due to demand ischemia as mentioned in consult not. No plan for further evaluation unless chest pain or other complaint after well dialyzed.  [facility] will sign off.   Medication Recommendations:  None Other recommendations (labs, testing, etc):  None Follow up as an outpatient:  Not necessary.

## 2021-12-24 NOTE — ED Notes (Signed)
Phlebotomy at bedside.

## 2021-12-25 DIAGNOSIS — M898X9 Other specified disorders of bone, unspecified site: Secondary | ICD-10-CM | POA: Diagnosis present

## 2021-12-25 DIAGNOSIS — R079 Chest pain, unspecified: Secondary | ICD-10-CM | POA: Diagnosis present

## 2021-12-25 DIAGNOSIS — F603 Borderline personality disorder: Secondary | ICD-10-CM | POA: Diagnosis present

## 2021-12-25 DIAGNOSIS — E872 Acidosis, unspecified: Secondary | ICD-10-CM | POA: Diagnosis present

## 2021-12-25 DIAGNOSIS — Z9115 Patient's noncompliance with renal dialysis: Secondary | ICD-10-CM | POA: Diagnosis not present

## 2021-12-25 DIAGNOSIS — Z20822 Contact with and (suspected) exposure to covid-19: Secondary | ICD-10-CM | POA: Diagnosis present

## 2021-12-25 DIAGNOSIS — F17210 Nicotine dependence, cigarettes, uncomplicated: Secondary | ICD-10-CM | POA: Diagnosis present

## 2021-12-25 DIAGNOSIS — F1414 Cocaine abuse with cocaine-induced mood disorder: Secondary | ICD-10-CM | POA: Diagnosis present

## 2021-12-25 DIAGNOSIS — N186 End stage renal disease: Secondary | ICD-10-CM | POA: Diagnosis present

## 2021-12-25 DIAGNOSIS — I248 Other forms of acute ischemic heart disease: Secondary | ICD-10-CM | POA: Diagnosis present

## 2021-12-25 DIAGNOSIS — Z5901 Sheltered homelessness: Secondary | ICD-10-CM | POA: Diagnosis not present

## 2021-12-25 DIAGNOSIS — Z8249 Family history of ischemic heart disease and other diseases of the circulatory system: Secondary | ICD-10-CM | POA: Diagnosis not present

## 2021-12-25 DIAGNOSIS — I5033 Acute on chronic diastolic (congestive) heart failure: Secondary | ICD-10-CM | POA: Diagnosis present

## 2021-12-25 DIAGNOSIS — Z6838 Body mass index (BMI) 38.0-38.9, adult: Secondary | ICD-10-CM | POA: Diagnosis not present

## 2021-12-25 DIAGNOSIS — N289 Disorder of kidney and ureter, unspecified: Secondary | ICD-10-CM | POA: Diagnosis not present

## 2021-12-25 DIAGNOSIS — Z992 Dependence on renal dialysis: Secondary | ICD-10-CM | POA: Diagnosis not present

## 2021-12-25 DIAGNOSIS — D631 Anemia in chronic kidney disease: Secondary | ICD-10-CM | POA: Diagnosis present

## 2021-12-25 DIAGNOSIS — I132 Hypertensive heart and chronic kidney disease with heart failure and with stage 5 chronic kidney disease, or end stage renal disease: Secondary | ICD-10-CM | POA: Diagnosis not present

## 2021-12-25 DIAGNOSIS — G629 Polyneuropathy, unspecified: Secondary | ICD-10-CM | POA: Diagnosis present

## 2021-12-25 DIAGNOSIS — I161 Hypertensive emergency: Secondary | ICD-10-CM | POA: Diagnosis present

## 2021-12-25 DIAGNOSIS — F319 Bipolar disorder, unspecified: Secondary | ICD-10-CM

## 2021-12-25 DIAGNOSIS — N049 Nephrotic syndrome with unspecified morphologic changes: Secondary | ICD-10-CM | POA: Diagnosis present

## 2021-12-25 DIAGNOSIS — Z833 Family history of diabetes mellitus: Secondary | ICD-10-CM | POA: Diagnosis not present

## 2021-12-25 DIAGNOSIS — G4700 Insomnia, unspecified: Secondary | ICD-10-CM | POA: Diagnosis present

## 2021-12-25 DIAGNOSIS — E8770 Fluid overload, unspecified: Secondary | ICD-10-CM | POA: Diagnosis not present

## 2021-12-25 DIAGNOSIS — Z9151 Personal history of suicidal behavior: Secondary | ICD-10-CM | POA: Diagnosis not present

## 2021-12-25 LAB — RAPID URINE DRUG SCREEN, HOSP PERFORMED
Amphetamines: NOT DETECTED
Barbiturates: NOT DETECTED
Benzodiazepines: NOT DETECTED
Cocaine: POSITIVE — AB
Opiates: NOT DETECTED
Tetrahydrocannabinol: NOT DETECTED

## 2021-12-25 LAB — RENAL FUNCTION PANEL
Albumin: 2.6 g/dL — ABNORMAL LOW (ref 3.5–5.0)
Anion gap: 13 (ref 5–15)
BUN: 87 mg/dL — ABNORMAL HIGH (ref 6–20)
CO2: 18 mmol/L — ABNORMAL LOW (ref 22–32)
Calcium: 7.8 mg/dL — ABNORMAL LOW (ref 8.9–10.3)
Chloride: 105 mmol/L (ref 98–111)
Creatinine, Ser: 17.08 mg/dL — ABNORMAL HIGH (ref 0.44–1.00)
GFR, Estimated: 3 mL/min — ABNORMAL LOW (ref >60)
Glucose, Bld: 94 mg/dL (ref 70–99)
Phosphorus: 8.4 mg/dL — ABNORMAL HIGH (ref 2.5–4.6)
Potassium: 4.8 mmol/L (ref 3.5–5.1)
Sodium: 136 mmol/L (ref 135–145)

## 2021-12-25 LAB — HIV ANTIBODY (ROUTINE TESTING W REFLEX): HIV Screen 4th Generation wRfx: NONREACTIVE

## 2021-12-25 MED ORDER — ARIPIPRAZOLE 5 MG PO TABS
7.5000 mg | ORAL_TABLET | Freq: Every day | ORAL | Status: DC
  Administered 2021-12-26: 7.5 mg via ORAL
  Filled 2021-12-25: qty 2
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
## 2021-12-25 NOTE — Consult Note (Signed)
[facility] Consult   Reason for Consult: Patient personality disorder, more episodes at home Referring Physician:  Dr. 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 female patient admitted with chest pain..  Patient has a medical history significant for borderline personality disorder, hypertension, PTSD, end-stage renal disease on hemodialysis Tuesday Thursday Saturday.  Patient is seen and case discussed with Dr. Lovette Cliche.   On evaluation patient is observed to be sitting upright in bed, smiling and joking appropriately, on the phone talking with her boyfriend.  Patient does acknowledge writer's presence in the room, end conversation on the phone with" I love you.  The psychiatrist just came in.  I will call you back."  Patient is alert and oriented, calm and cooperative and engages well with this nurse practitioner.  Writer introduced self and reason for psychiatric consult.  Patient does endorse increase in anxiety and currently rates her anxiety at 9 out of 10 with 10 being the worst on a Likert scale.  She also endorses depressive symptoms, currently rates her depression 9 out of 10 with 10 being the worst on a Likert scale.  She does endorse sleeping disturbances" I toss and turn all night.  In addition to eating disturbances as she describes her appetite as poor.  This nurse practitioner did leave the room and return to find  patient ordering food from dietary, which contraindicates her poor appetite.  Patient denies any current outpatient psychiatric services, stating she does not have a way despite her last OV was offered via telehealth.  She does report one recent inpatient hospitalization in March 2018 for medication management and suicidal thoughts.  At present patient has no current risk for suicide, however this can fluctuate and change based off patient's previous history.  She denies any current suicidal thoughts, homicidal thoughts, and/or auditory visual hallucinations.   Patient is alert and oriented, calm and cooperative.  Patient is observed with increased tangentiality, pressured speech at times mood instability, and emotional dysregulation.   She continues to endorse effectiveness with Abilify, and is requesting additional medication for her anxiety.  We did review several medication classifications that are used to treat anxiety however unable to come up with a mutual agreement.  Patient does appear to be interested in benzodiazepines, which will be declined during inpatient hospitalization stay.  She is encouraged to follow up with outpatient psychiatry for additional medication adjustments and or management.  Patient currently denies any suicidal thoughts, homicidal thoughts, and or auditory or visual hallucinations.  Patient does not appear to be a danger to herself, and can contract for safety at this time.  Patient appears to be psychiatrically stable, and will be psych cleared.  HPI:   26 year old with past medical history significant for borderline Personality Disorder, hypertension, PTSD, ESRD on hemodialysis TTS presented with chest pain.  Patient was quite somnolent at the time of evaluation by admitting physician.  She missed hemodialysis for some time due to transportation issues.   Presented  with chest pain for 1 week.  Missing hemodialysis due to transportation issues.  She is homeless.  Troponin  mildly elevated 89----95.  She was a started on labetalol infusion, treating for hypertensive crisis.  Cardiology and nephrology consulted and following   Past Psychiatric History: Bipolar disorder, cocaine use disorder, cannabis use disorder, major depression, posttraumatic stress disorder, borderline personality disorder.  Patient reports multiple failed antipsychotic treatments.  She reports her most recent psychiatric inpatient hospitalization was in 2018, for medication management and suicidal thoughts.  Her last suicide attempt was 11 years ago, in which she reports she threw herself off of a roof that resulted in broken foot and broken collarbone.    Risk to Self:  Denies Risk to Others:  Denies Prior Inpatient Therapy:  [facility] in [place], last hospitalized in March 2018 for medication management and suicidal ideations.  Per chart review she has had up to 20 inpatient admissions since adolescent age. Prior Outpatient Therapy:  No current outpatient behavioral health services.  Had an appointment at [facility]   Past Medical History:  Past Medical History:  Diagnosis Date   Asthma    as a child, no problem as an adult, no inhaler   Complication of anesthesia    woke up before tube removed, 1 time fought nurses   ESRD on hemodialysis [facility])    M-W-F   History of borderline personality disorder    Hypertension    diagnosed as child; stopped meds at 52 yo   Insomnia    Neuromuscular disorder ([place])    peripheral neuropathy   PTSD (post-traumatic stress disorder)     Past Surgical History:  Procedure Laterality Date   AV FISTULA PLACEMENT Left 10/18/2020   Procedure: LEFT ARM ARTERIOVENOUS (AV) FISTULA CREATION;  Surgeon: Serafina Mitchell, MD;  Location: [facility] OR;  Service: Vascular;  Laterality: Left;   CHOLECYSTECTOMY     extraction of wisdom teeth     FISTULA SUPERFICIALIZATION Left 02/13/2021   Procedure: LEFT BRACHIOCEPHALIC ARTERIOVENOUS  FISTULA SUPERFICIALIZATION;  Surgeon: Serafina Mitchell, MD;  Location: [facility] OR;  Service: Vascular;  Laterality: Left;   RENAL BIOPSY     x 2   TUNNELED VENOUS CATHETER PLACEMENT  02/11/2021   [facility]   Family History:  Family History  Adopted: Yes  Problem Relation Age of Onset   Diabetes Other    Hypertension Other    Family Psychiatric  History: As per patient mother diagnosed with schizophrenia Social History:  Social History   Substance and Sexual Activity  Alcohol Use Yes   Comment: "maybe 3 a month."- liquor     Social History   Substance and Sexual Activity  Drug Use Yes   Types: Marijuana, Cocaine   Comment: reports daily use of both    Social History   Socioeconomic History   Marital status: Soil scientist    Spouse name: Not on file   Number of children: 1   Years of education: Not on file   Highest education level: 12th grade  Occupational History   Occupation: unemployed  Tobacco Use   Smoking status: Every Day    Packs/day: 0.50    Years: 10.00    Pack years: 5.00    Types: Cigarettes   Smokeless tobacco: Never  Vaping Use   Vaping Use: Never used  Substance and Sexual Activity   Alcohol use: Yes    Comment: "maybe 3 a month."- liquor   Drug use: Yes  Types: Marijuana, Cocaine    Comment: reports daily use of both   Sexual activity: Not Currently    Birth control/protection: None  Other Topics Concern   Not on file  Social History Narrative   Not on file   Social Determinants of Health   Financial Resource Strain: Not on file  Food Insecurity: Not on file  Transportation Needs: Not on file  Physical Activity: Not on file  Stress: Not on file  Social Connections: Not on file   Additional Social History:    Allergies:   Allergies  Allergen Reactions   Prozac [Fluoxetine Hcl] Other (See Comments)    Panic attack    Wellbutrin [Bupropion] Other (See Comments)    Panic attack   Prednisone Other (See Comments)    Pt  states that this med caused pancreatitis.     Labs:  Results for orders placed or performed during the hospital encounter of 12/23/21 (from the past 48 hour(s))  .Hepatitis B Surface Antigen     Status: None   Collection Time: 12/23/21  5:45 PM  Result Value Ref Range   Hepatitis B Surface Ag NON REACTIVE NON REACTIVE    Comment: Performed at [facility] [address], [place], [place] [address]  Hepatitis B DNA, Ultraquantitative, PCR     Status: None   Collection Time: 12/23/21  5:46 PM  Result Value Ref Range   HBV DNA SERPL PCR-ACNC HBV DNA not detected IU/mL   HBV DNA SERPL PCR-LOG IU UNABLE TO CALCULATE log10 IU/mL    Comment: (NOTE) Unable to calculate result since non-numeric result obtained for component test.    Test Info: Comment     Comment: (NOTE) The reportable range for this assay is 10 IU/mL to 1 billion IU/mL. Performed At: [facility] [place], [place] [id number] Rush Farmer MD [id number]   Hepatitis B surface antibody     Status: None   Collection Time: 12/23/21  5:46 PM  Result Value Ref Range   Hepatitis B-Post >1,000.0 Immunity>9.9 mIU/mL    Comment: (NOTE)  Status of Immunity                     Anti-HBs Level  ------------------                     -------------- Inconsistent with Immunity                   0.0 - 9.9 Consistent with Immunity                          >9.9 Performed At: [facility] [place], [place] [id number] Rush Farmer MD [id number]   Hemoglobin A1c     Status: None   Collection Time: 12/24/21  2:59 PM  Result Value Ref Range   Hgb A1c MFr Bld 5.4 4.8 - 5.6 %    Comment: (NOTE)         Prediabetes: 5.7 - 6.4         Diabetes: >6.4         Glycemic control for adults with diabetes: <7.0    Mean Plasma Glucose 108 mg/dL    Comment: (NOTE) Performed At: [facility] [address] [place], [place] [id number] Rush Farmer MD [id number]   Lipid  panel     Status: Abnormal   Collection Time: 12/24/21  2:59 PM  Result  Value Ref Range   Cholesterol 111 0 - 200 mg/dL   Triglycerides 90 <150 mg/dL   HDL 39 (L) >40 mg/dL   Total CHOL/HDL Ratio 2.8 RATIO   VLDL 18 0 - 40 mg/dL   LDL Cholesterol 54 0 - 99 mg/dL    Comment:        Total Cholesterol/HDL:CHD Risk Coronary Heart Disease Risk Table                     Men   Women  1/2 Average Risk   3.4   3.3  Average Risk       5.0   4.4  2 X Average Risk   9.6   7.1  3 X Average Risk  23.4   11.0        Use the calculated Patient Ratio above and the CHD Risk Table to determine the patient's CHD Risk.        ATP III CLASSIFICATION (LDL):  <100     mg/dL   Optimal  100-129  mg/dL   Near or Above                    Optimal  130-159  mg/dL   Borderline  160-189  mg/dL   High  >190     mg/dL   Very High Performed at [facility] [address] [place], [place] [address]   CBC     Status: Abnormal   Collection Time: 12/24/21  2:59 PM  Result Value Ref Range   WBC 8.4 4.0 - 10.5 K/uL   RBC 3.35 (L) 3.87 - 5.11 MIL/uL   Hemoglobin 10.1 (L) 12.0 - 15.0 g/dL   HCT 32.0 (L) 36.0 - 46.0 %   MCV 95.5 80.0 - 100.0 fL   MCH 30.1 26.0 - 34.0 pg   MCHC 31.6 30.0 - 36.0 g/dL   RDW 14.4 11.5 - 15.5 %   Platelets 152 150 - 400 K/uL   nRBC 0.0 0.0 - 0.2 %    Comment: Performed at [facility], [place] [address], [place], [place] [address]  Magnesium     Status: None   Collection Time: 12/24/21  2:59 PM  Result Value Ref Range   Magnesium 2.4 1.7 - 2.4 mg/dL    Comment: Performed at [facility], [place] [address] [place], [place] [address]  Basic metabolic panel     Status: Abnormal   Collection Time: 12/24/21  2:59 PM  Result Value Ref Range   Sodium 135 135 - 145 mmol/L    Comment: DELTA CHECK NOTED   Potassium 4.3 3.5 - 5.1 mmol/L   Chloride 104 98 - 111 mmol/L   CO2 18 (L) 22 - 32 mmol/L   Glucose, Bld 114 (H) 70 - 99 mg/dL    Comment: Glucose reference range  applies only to samples taken after fasting for at least 8 hours.   BUN 81 (H) 6 - 20 mg/dL   Creatinine, Ser 17.25 (H) 0.44 - 1.00 mg/dL   Calcium 7.9 (L) 8.9 - 10.3 mg/dL   GFR, Estimated 3 (L) >60 mL/min    Comment: (NOTE) Calculated using the CKD-EPI Creatinine Equation (2021)    Anion gap 13 5 - 15    Comment: Performed at [facility] [address], [place], [place] [address]  Urine rapid drug screen (hosp performed)     Status: Abnormal   Collection Time: 12/24/21  11:59 PM  Result Value Ref Range   Opiates NONE DETECTED NONE DETECTED   Cocaine POSITIVE (A) NONE DETECTED   Benzodiazepines NONE DETECTED NONE DETECTED   Amphetamines NONE DETECTED NONE DETECTED   Tetrahydrocannabinol NONE DETECTED NONE DETECTED   Barbiturates NONE DETECTED NONE DETECTED    Comment: (NOTE) DRUG SCREEN FOR MEDICAL PURPOSES ONLY.  IF CONFIRMATION IS NEEDED FOR ANY PURPOSE, NOTIFY LAB WITHIN 5 DAYS.  LOWEST DETECTABLE LIMITS FOR URINE DRUG SCREEN Drug Class                     Cutoff (ng/mL) Amphetamine and metabolites    1000 Barbiturate and metabolites    200 Benzodiazepine                 161 Tricyclics and metabolites     300 Opiates and metabolites        300 Cocaine and metabolites        300 THC                            50 Performed at [facility], [place] [address] [place], [place] [address]   Renal function panel     Status: Abnormal   Collection Time: 12/25/21  7:02 AM  Result Value Ref Range   Sodium 136 135 - 145 mmol/L   Potassium 4.8 3.5 - 5.1 mmol/L   Chloride 105 98 - 111 mmol/L   CO2 18 (L) 22 - 32 mmol/L   Glucose, Bld 94 70 - 99 mg/dL    Comment: Glucose reference range applies only to samples taken after fasting for at least 8 hours.   BUN 87 (H) 6 - 20 mg/dL   Creatinine, Ser 17.08 (H) 0.44 - 1.00 mg/dL   Calcium 7.8 (L) 8.9 - 10.3 mg/dL   Phosphorus 8.4 (H) 2.5 - 4.6 mg/dL   Albumin 2.6 (L) 3.5 - 5.0 g/dL   GFR, Estimated 3 (L) >60 mL/min     Comment: (NOTE) Calculated using the CKD-EPI Creatinine Equation (2021)    Anion gap 13 5 - 15    Comment: Performed at [facility] [address], [place], [place] [address]    Current Facility-Administered Medications  Medication Dose Route Frequency Provider Last Rate Last Admin   acetaminophen (TYLENOL) tablet 650 mg  650 mg Oral Q6H PRN Karmen Bongo, MD   650 mg at 12/24/21 0010   Or   acetaminophen (TYLENOL) suppository 650 mg  650 mg Rectal Q6H PRN Karmen Bongo, MD       Derrill Memo ON 12/26/2021] ARIPiprazole (ABILIFY) tablet 7.5 mg  7.5 mg Oral Daily Starkes-Perry, Birttany Dechellis S, FNP       calcitRIOL (ROCALTROL) capsule 1.5 mcg  1.5 mcg Oral Q T,Th,Sa-HD Karmen Bongo, MD   1.5 mcg at 12/25/21 1133   calcium acetate (PHOSLO) capsule 667 mg  667 mg Oral TID WC Karmen Bongo, MD   667 mg at 12/25/21 1025   calcium carbonate (dosed in mg elemental calcium) suspension 500 mg of elemental calcium  500 mg of elemental calcium Oral Q6H PRN Karmen Bongo, MD       camphor-menthol [facility]) lotion 1 application  1 application Topical J1B PRN Karmen Bongo, MD       And   hydrOXYzine (ATARAX) tablet 25 mg  25 mg Oral Q8H PRN Karmen Bongo, MD   25 mg at 12/24/21 1819   Chlorhexidine Gluconate  Cloth 2 % PADS 6 each  6 each Topical Q0600 Karmen Bongo, MD       docusate sodium [facility]) enema 283 mg  1 enema Rectal PRN Karmen Bongo, MD       feeding supplement (NEPRO CARB STEADY) liquid 237 mL  237 mL Oral TID PRN Karmen Bongo, MD       gabapentin (NEURONTIN) capsule 300 mg  300 mg Oral TID Karmen Bongo, MD   300 mg at 12/25/21 1025   heparin injection 5,000 Units  5,000 Units Subcutaneous Q8H Karmen Bongo, MD       hydrALAZINE (APRESOLINE) injection 5 mg  5 mg Intravenous Q4H PRN Karmen Bongo, MD       LORazepam (ATIVAN) injection 0.5-1 mg  0.5-1 mg Intravenous Q6H PRN Regalado, Belkys A, MD   1 mg at 12/25/21 1133   losartan (COZAAR) tablet 50 mg  50 mg Oral Daily  Karmen Bongo, MD   50 mg at 12/24/21 1001   multivitamin (RENA-VIT) tablet 1 tablet  1 tablet Oral QHS Heloise Purpura, RPH   1 tablet at 12/24/21 2331   nicotine (NICODERM CQ - dosed in mg/24 hours) patch 14 mg  14 mg Transdermal Daily PRN Karmen Bongo, MD   14 mg at 12/25/21 1126   ondansetron (ZOFRAN) tablet 4 mg  4 mg Oral Q6H PRN Karmen Bongo, MD       Or   ondansetron [facility]) injection 4 mg  4 mg Intravenous Q6H PRN Karmen Bongo, MD       pantoprazole (PROTONIX) EC tablet 40 mg  40 mg Oral Daily Karmen Bongo, MD   40 mg at 12/25/21 1024   sodium chloride flush (NS) 0.9 % injection 3 mL  3 mL Intravenous Lillia Mountain, MD   3 mL at 12/25/21 1137   sorbitol 70 % solution 30 mL  30 mL Oral PRN Karmen Bongo, MD       traMADol Veat[facility]bon) tablet 25 mg  25 mg Oral Q8H PRN Regalado, Belkys A, MD   25 mg at 12/24/21 1001   zolpidem (AMBIEN) tablet 5 mg  5 mg Oral QHS PRN Karmen Bongo, MD       Facility-Administered Medications Ordered in Other Encounters  Medication Dose Route Frequency Provider Last Rate Last Admin   ceFAZolin (ANCEF) 3 g in dextrose 5 % 50 mL IVPB  3 g Intravenous 30 min Pre-Op Serafina Mitchell, MD        Musculoskeletal: Strength & Muscle Tone: within normal limits Gait & Station: normal Patient leans: N/A  Psychiatric Specialty Exam: Physical Exam Vitals and nursing note reviewed.  Constitutional:      Appearance: She is well-developed and normal weight.  HENT:     Head: Normocephalic.  Cardiovascular:     Rate and Rhythm: Normal rate.  Neurological:     Mental Status: She is alert.  Psychiatric:        Attention and Perception: Attention normal.        Mood and Affect: Affect is labile and inappropriate.        Speech: Speech normal.        Behavior: Behavior is cooperative.        Thought Content: Thought content normal.        Cognition and Memory: Cognition and memory normal.        Judgment: Judgment is impulsive.     Review of Systems  Psychiatric/Behavioral: Negative.    All other systems reviewed and  are negative.  Blood pressure (!) 145/71, pulse 82, temperature 98.7 F (37.1 C), temperature source Oral, resp. rate 20, weight 111.5 kg, SpO2 98 %.Body mass index is 37.39 kg/m.  General Appearance: Fairly Groomed  Eye Contact:  Fair  Speech:  Clear and Coherent and Normal Rate  Volume:  Normal  Mood:  Anxious  Affect:  Depressed, Full Range, Restricted and Tearful  Thought Process:  Coherent, Linear and Descriptions of Associations: Intact  Orientation:  Full (Time, Place, and Person)  Thought Content:  Logical  Suicidal Thoughts:  No  Homicidal Thoughts:  No  Memory:  Immediate;   Fair Recent;   Fair Remote;   Fair  Judgement:  Intact  Insight:  Fair  Psychomotor Activity:  Normal  Concentration:  Concentration: Fair and Attention Span: Fair  Recall:  [employer] of Knowledge:  Fair  Language:  Fair  Akathisia:  No  Handed:  Right  AIMS (if indicated):     Assets:  Communication Skills Desire for Improvement Financial Resources/Insurance Leisure Time Physical Health Social Support  ADL's:  Intact  Cognition:  WNL  Sleep:      Flowsheet Row ED to Hosp-Admission (Current) from 12/23/2021 in [facility] ED from 10/04/2021 in [facility] ED from 08/01/2021 in [facility] No Risk No Risk High Risk        Treatment Plan Summary: Plan   -We will start Abilify 7.5 mg p.o. daily for maintenance of bipolar disorder.  Patient will need to follow-up with outpatient services, for continuation of Abilify upon discharge.  We also discussed that Abilify is safe and does not require renal clearance, therefore she can continue to take this medication while on dialysis. -With previous psychiatric history of borderline personality disorder, it is common for her to have fluctuations in  mood, emotional dysregulation, and endorsing no suicidal thoughts.  Patient may also exhibit attention seeking behaviors, which will potentially impact her overall decision-making.   -Patient at this time denies suicidal ideations, she is appropriate in her actions, and is able to contract for safety.  - She does not appear to be an imminent risk to herself or others, therefore she does not currently meet criteria for involuntary commitment.   Disposition: No evidence of imminent risk to self or others at present.   Patient does not meet criteria for psychiatric inpatient admission.  Thank you for your consult! We will sign off!  Suella Broad, FNP 12/25/2021 1:57 PM

## 2021-12-25 NOTE — Progress Notes (Signed)
Pt receives out-pt HD at [facility] on TTS. Pt is to arrive at 11:50 for 12:10 chair time. Clinic confirms pt's last treatment was on 1/12. Met with pt at bedside. Pt states she is homeless and does not have an address for transportation to pick her up at. Pt states that when she was going to HD appts, pt was living with ex in his car and he would drive her to the clinic. Pt states she has had no place to stay or way to get to clinic since the relationship with ex ended. Pt confirms that she has used medicaid transportation in the past and could use them again if she has an address for transport to pick her up at. Informed pt that it is likely that a [facility] staff member may meet with her regarding resources (MD states she has entered a consult) due to homelessness. Pt can return to clinic at d/c. Will assist as needed.   Melven Sartorius Renal Navigator [phone]

## 2021-12-25 NOTE — TOC Progression Note (Signed)
Transition of Care [facility]) - Progression Note    Patient Details  Name: Shaquanda Graves MRN: [id number] Date of Birth: 14-Aug-1996  Transition of Care [facility]) CM/SW Contact  Zenon Mayo, RN Phone Number: 12/25/2021, 10:07 AM  Clinical Narrative:     Transition of Care [facility]) Screening Note   Patient Details  Name: Nasim Garofano Date of Birth: January 10, 1996   Transition of Care [facility]) CM/SW Contact:    Zenon Mayo, RN Phone Number: 12/25/2021, 10:07 AM    Transition of Care Department [facility]) has reviewed patient and no TOC needs have been identified at this time. We will continue to monitor patient advancement through interdisciplinary progression rounds. If new patient transition needs arise, please place a TOC consult.          Expected Discharge Plan and Services                                                 Social Determinants of Health (SDOH) Interventions    Readmission Risk Interventions No flowsheet data found.

## 2021-12-25 NOTE — Plan of Care (Signed)
°  Problem: Clinical Measurements: Goal: Respiratory complications will improve Outcome: Progressing Goal: Cardiovascular complication will be avoided Outcome: Progressing   Problem: Activity: Goal: Risk for activity intolerance will decrease Outcome: Progressing   Problem: Coping: Goal: Level of anxiety will decrease Outcome: Progressing   Problem: Elimination: Goal: Will not experience complications related to urinary retention Outcome: Progressing   Problem: Pain Managment: Goal: General experience of comfort will improve Outcome: Progressing   Problem: Safety: Goal: Ability to remain free from injury will improve Outcome: Progressing

## 2021-12-25 NOTE — Progress Notes (Signed)
PROGRESS NOTE    Natasha Long  IWP:[id number] DOB: [birth year] DOA: 12/23/2021 PCP: [name], MD   Brief Narrative: 26 year old with past medical history significant for borderline Personality Disorder, hypertension, PTSD, ESRD on hemodialysis TTS presented with chest pain.  Patient was quite somnolent at the time of evaluation by admitting physician.  She missed hemodialysis for some time due to transportation issues.  Presented with chest pain for 1 week.  Missing hemodialysis due to transportation issues.  She is homeless.  Troponin mildly elevated 89----95.  She was a started on labetalol infusion, treating for hypertensive crisis.  Cardiology and nephrology consulted and following    Assessment & Plan:   Principal Problem:   Hypervolemia associated with renal insufficiency Active Problems:   Chronic hypertension   Borderline personality disorder (HCC)   Moderate cannabis use disorder (HCC)   Morbid obesity (HCC)   Cocaine use, 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[place])   ESRD on dialysis ([place])   Tobacco use   Elevated troponin   Chest pain  1-Acute on chronic Diastolic HF; Hypervolemia associated with Renal Insufficiency: -She has not been recently attending hemodialysis sessions. -Nephrology consulted, she had hemodialysis 1/24. For HD today.   2-Elevation of troponin: Cardiology consulted and recommended echo. Plan to control blood pressure Likely demand ischemia from hypertension Last episode of chest pain yesterday.   Tobacco use: Encourage cessation  ESRD on hemodialysis TTS Nephrology consulted.  Morbid obesity: BMI 38 Needs Lifestyle modifications  Moderate cannabis use disorder: Encourage cessation  Borderline personality disorder Continue with Abilify-Neurontin As needed hydroxyzine She was agitated in the ED, low-dose Ativan as needed ordered.  Psych has been consulted She was not taking Abilify out patient. Resume.   Appreciate Psych assistance. Recommend SW to help with placement to [facility], she will also need help with transportation to HD.   Chronic Hypertension: Continue with Cozaar. She was transiently on labetalol infusion.  Screening;  Check for STD, Check HIV.   Estimated body mass index is 37.39 kg/m as calculated from the following:   Height as of 06/20/21: 5\' 8"  (1.727 m).   Weight as of this encounter: 111.5 kg.   DVT prophylaxis: Heparin Code Status: Full code Family Communication: Care discussed with patient Disposition Plan:  Status is: inpatient.   The patient remains OBS appropriate and will d/c before 2 midnights.  Patient is homeless, Education officer, museum consulted. Needs assistance with transpiration for HD as well.        Consultants:  Nephrology  Psych   Procedures:  HD  Antimicrobials:    Subjective: Denies chest pain today. Last episode yesterday.  Breathing better.  She asked to be tested for STD  Objective: Vitals:   12/24/21 2337 12/25/21 0049 12/25/21 0547 12/25/21 0548  BP: (!) 158/87   (!) 145/71  Pulse: 77   82  Resp: 20   20  Temp: 97.8 F (36.6 C)  98.7 F (37.1 C)   TempSrc: Oral  Oral   SpO2: 98%   98%  Weight:  111.5 kg      Intake/Output Summary (Last 24 hours) at 12/25/2021 1316 Last data filed at 12/25/2021 0845 Gross per 24 hour  Intake 1250 ml  Output 550 ml  Net 700 ml    Filed Weights   12/25/21 0049  Weight: 111.5 kg    Examination:  General exam: NAD Respiratory system: CTA Cardiovascular system: S 1, S 2 RRR Gastrointestinal system: BS present, soft, nt Central nervous system: alert Extremities: Symmetric  power   Data Reviewed: I have personally reviewed following labs and imaging studies  CBC: Recent Labs  Lab 12/23/21 0416 12/24/21 1459  WBC 11.2* 8.4  NEUTROABS 9.1*  --   HGB 10.5* 10.1*  HCT 32.2* 32.0*  MCV 93.9 95.5  PLT 167 132    Basic Metabolic Panel: Recent Labs  Lab 12/23/21 0416  12/24/21 1459 12/25/21 0702  NA 142 135 136  K 4.9 4.3 4.8  CL 108 104 105  CO2 15* 18* 18*  GLUCOSE 99 114* 94  BUN 123* 81* 87*  CREATININE 22.88* 17.25* 17.08*  CALCIUM 7.4* 7.9* 7.8*  MG  --  2.4  --   PHOS  --   --  8.4*    GFR: CrCl cannot be calculated (Unknown ideal weight.). Liver Function Tests: Recent Labs  Lab 12/25/21 0702  ALBUMIN 2.6*   No results for input(s): LIPASE, AMYLASE in the last 168 hours. No results for input(s): AMMONIA in the last 168 hours. Coagulation Profile: No results for input(s): INR, PROTIME in the last 168 hours. Cardiac Enzymes: No results for input(s): CKTOTAL, CKMB, CKMBINDEX, TROPONINI in the last 168 hours. BNP (last 3 results) No results for input(s): PROBNP in the last 8760 hours. HbA1C: Recent Labs    12/24/21 1459  HGBA1C 5.4   CBG: No results for input(s): GLUCAP in the last 168 hours. Lipid Profile: Recent Labs    12/24/21 1459  CHOL 111  HDL 39*  LDLCALC 54  TRIG 90  CHOLHDL 2.8   Thyroid Function Tests: No results for input(s): TSH, T4TOTAL, FREET4, T3FREE, THYROIDAB in the last 72 hours. Anemia Panel: No results for input(s): VITAMINB12, FOLATE, FERRITIN, TIBC, IRON, RETICCTPCT in the last 72 hours. Sepsis Labs: No results for input(s): PROCALCITON, LATICACIDVEN in the last 168 hours.  Recent Results (from the past 240 hour(s))  Resp Panel by RT-PCR (Flu A&B, Covid) Nasopharyngeal Swab     Status: None   Collection Time: 12/23/21 12:45 PM   Specimen: Nasopharyngeal Swab; Nasopharyngeal(NP) swabs in vial transport medium  Result Value Ref Range Status   SARS Coronavirus 2 by RT PCR NEGATIVE NEGATIVE Final    Comment: (NOTE) SARS-CoV-2 target nucleic acids are NOT DETECTED.  The SARS-CoV-2 RNA is generally detectable in upper respiratory specimens during the acute phase of infection. The lowest concentration of SARS-CoV-2 viral copies this assay can detect is 138 copies/mL. A negative result does not  preclude SARS-Cov-2 infection and should not be used as the sole basis for treatment or other patient management decisions. A negative result may occur with  improper specimen collection/handling, submission of specimen other than nasopharyngeal swab, presence of viral mutation(s) within the areas targeted by this assay, and inadequate number of viral copies(<138 copies/mL). A negative result must be combined with clinical observations, patient history, and epidemiological information. The expected result is Negative.  Fact Sheet for Patients:  EntrepreneurPulse.com.au  Fact Sheet for Healthcare Providers:  IncredibleEmployment.be  This test is no t yet approved or cleared by the [place] FDA and  has been authorized for detection and/or diagnosis of SARS-CoV-2 by FDA under an Emergency Use Authorization (EUA). This EUA will remain  in effect (meaning this test can be used) for the duration of the COVID-19 declaration under Section 564(b)(1) of the Act, 21 U.S.C.section 360bbb-3(b)(1), unless the authorization is terminated  or revoked sooner.       Influenza A by PCR NEGATIVE NEGATIVE Final   Influenza B by PCR NEGATIVE NEGATIVE Final  Comment: (NOTE) The Xpert Xpress SARS-CoV-2/FLU/RSV plus assay is intended as an aid in the diagnosis of influenza from Nasopharyngeal swab specimens and should not be used as a sole basis for treatment. Nasal washings and aspirates are unacceptable for Xpert Xpress SARS-CoV-2/FLU/RSV testing.  Fact Sheet for Patients: EntrepreneurPulse.com.au  Fact Sheet for Healthcare Providers: IncredibleEmployment.be  This test is not yet approved or cleared by the [place] FDA and has been authorized for detection and/or diagnosis of SARS-CoV-2 by FDA under an Emergency Use Authorization (EUA). This EUA will remain in effect (meaning this test can be used) for the  duration of the COVID-19 declaration under Section 564(b)(1) of the Act, 21 U.S.C. section 360bbb-3(b)(1), unless the authorization is terminated or revoked.  Performed at [facility], [place] [address], [place], [place] [address]           Radiology Studies: ECHOCARDIOGRAM COMPLETE  Result Date: 12/24/2021    ECHOCARDIOGRAM REPORT   Patient Name:   Natasha Long Date of Exam: 12/24/2021 Medical Rec #:  [id number]         Height:       68.0 in Accession #:    [id number]        Weight:       254.6 lb Date of Birth:  09/24/1996         BSA:          2.265 m Patient Age:    25 years          BP:           155/54 mmHg Patient Gender: F                 HR:           71 bpm. Exam Location:  Inpatient Procedure: 2D Echo, Color Doppler and Cardiac Doppler Indications:    R07.9* Chest pain, unspecified  History:        Patient has prior history of Echocardiogram examinations, most                 recent 05/22/2021. Risk Factors:Hypertension and H/o                 polysubstance abuse. Prior echo performed at [facility].  Sonographer:    Raquel Sarna Senior RDCS Referring Phys: [place]  1. Left ventricular ejection fraction, by estimation, is 60 to 65%. The left ventricle has normal function. The left ventricle has no regional wall motion abnormalities. There is moderate left ventricular hypertrophy. Left ventricular diastolic parameters are consistent with Grade II diastolic dysfunction (pseudonormalization).  2. Right ventricular systolic function is normal. The right ventricular size is normal. There is mildly elevated pulmonary artery systolic pressure. The estimated right ventricular systolic pressure is 21.1 mmHg.  3. Left atrial size was mildly dilated.  4. Right atrial size was mildly dilated.  5. The mitral valve is normal in structure. Moderate mitral valve regurgitation, posteriorly-directed. No evidence of mitral stenosis.  6. The aortic valve is tricuspid. Aortic valve  regurgitation is not visualized. Aortic valve mean gradient measures 12.0 mmHg. Though the mean gradient is elevated, the valve opens well. Suspect the elevated gradient is related to high flow.  7. The inferior vena cava is normal in size with greater than 50% respiratory variability, suggesting right atrial pressure of 3 mmHg. FINDINGS  Left Ventricle: Left ventricular ejection fraction, by estimation, is 60 to 65%. The left ventricle has normal function. The left ventricle has  no regional wall motion abnormalities. The left ventricular internal cavity size was normal in size. There is  moderate left ventricular hypertrophy. Left ventricular diastolic parameters are consistent with Grade II diastolic dysfunction (pseudonormalization). Right Ventricle: The right ventricular size is normal. No increase in right ventricular wall thickness. Right ventricular systolic function is normal. There is mildly elevated pulmonary artery systolic pressure. The tricuspid regurgitant velocity is 3.08  m/s, and with an assumed right atrial pressure of 3 mmHg, the estimated right ventricular systolic pressure is 40.9 mmHg. Left Atrium: Left atrial size was mildly dilated. Right Atrium: Right atrial size was mildly dilated. Pericardium: There is no evidence of pericardial effusion. Mitral Valve: The mitral valve is normal in structure. Moderate mitral valve regurgitation. No evidence of mitral valve stenosis. Tricuspid Valve: The tricuspid valve is normal in structure. Tricuspid valve regurgitation is mild. Aortic Valve: The aortic valve is tricuspid. Aortic valve regurgitation is not visualized. Aortic valve mean gradient measures 12.0 mmHg. Aortic valve peak gradient measures 20.6 mmHg. Aortic valve area, by VTI measures 2.43 cm. Pulmonic Valve: The pulmonic valve was normal in structure. Pulmonic valve regurgitation is trivial. Aorta: The aortic root is normal in size and structure. Venous: The inferior vena cava is normal in  size with greater than 50% respiratory variability, suggesting right atrial pressure of 3 mmHg. IAS/Shunts: No atrial level shunt detected by color flow Doppler.  LEFT VENTRICLE PLAX 2D LVIDd:         5.60 cm   Diastology LVIDs:         3.90 cm   LV e' medial:    7.20 cm/s LV PW:         1.50 cm   LV E/e' medial:  19.0 LV IVS:        1.10 cm   LV e' lateral:   9.45 cm/s LVOT diam:     2.10 cm   LV E/e' lateral: 14.5 LV SV:         103 LV SV Index:   46 LVOT Area:     3.46 cm  RIGHT VENTRICLE RV S prime:     15.40 cm/s TAPSE (M-mode): 2.5 cm LEFT ATRIUM             Index        RIGHT ATRIUM           Index LA diam:        3.80 cm 1.68 cm/m   RA Area:     20.20 cm LA Vol (A2C):   45.9 ml 20.27 ml/m  RA Volume:   58.60 ml  25.88 ml/m LA Vol (A4C):   56.3 ml 24.86 ml/m LA Biplane Vol: 51.3 ml 22.65 ml/m  AORTIC VALVE AV Area (Vmax):    2.23 cm AV Area (Vmean):   2.43 cm AV Area (VTI):     2.43 cm AV Vmax:           227.00 cm/s AV Vmean:          164.000 cm/s AV VTI:            0.425 m AV Peak Grad:      20.6 mmHg AV Mean Grad:      12.0 mmHg LVOT Vmax:         146.00 cm/s LVOT Vmean:        115.000 cm/s LVOT VTI:          0.298 m LVOT/AV VTI ratio: 0.70  AORTA Ao Root diam:  2.60 cm Ao Asc diam:  2.70 cm MITRAL VALVE                TRICUSPID VALVE MV Area (PHT): 2.28 cm     TR Peak grad:   37.9 mmHg MV Decel Time: 333 msec     TR Vmax:        308.00 cm/s MV E velocity: 137.00 cm/s MV A velocity: 46.00 cm/s   SHUNTS MV E/A ratio:  2.98         Systemic VTI:  0.30 m                             Systemic Diam: 2.10 cm Dalton McleanMD Electronically signed by Franki Monte Signature Date/Time: 12/24/2021/6:12:33 PM    Final         Scheduled Meds:  [START ON 12/26/2021] ARIPiprazole  7.5 mg Oral Daily   calcitRIOL  1.5 mcg Oral Q T,Th,Sa-HD   calcium acetate  667 mg Oral TID WC   Chlorhexidine Gluconate Cloth  6 each Topical Q0600   gabapentin  300 mg Oral TID   heparin  5,000 Units Subcutaneous Q8H    losartan  50 mg Oral Daily   multivitamin  1 tablet Oral QHS   pantoprazole  40 mg Oral Daily   sodium chloride flush  3 mL Intravenous Q12H   Continuous Infusions:   LOS: 0 days    Time spent: 35 minutes     Anara Cowman A Jasenia Weilbacher, MD [facility]   If 7PM-7AM, please contact night-coverage [email or web address]  12/25/2021, 1:16 PM

## 2021-12-25 NOTE — Progress Notes (Signed)
[facility] Progress Note  Subjective: pt seen in room, no new c/o's  Vitals:   12/24/21 2337 12/25/21 0049 12/25/21 0547 12/25/21 0548  BP: (!) 158/87   (!) 145/71  Pulse: 77   82  Resp: 20   20  Temp: 97.8 F (36.6 C)  98.7 F (37.1 C)   TempSrc: Oral  Oral   SpO2: 98%   98%  Weight:  111.5 kg      Exam:  alert, nad   no jvd  Chest cta bilat  Cor reg no RG  Abd soft ntnd no ascites   Ext no LE edema   Alert, NF, ox3    LUE aVF+bruit     Home meds include - abilify, phoslo 1 tid, neurontin 100 bid vs 300 tid, losartan 25 , prilosec, prns/ vits/ supps        OP HD: TTS [facility]   3.5h  350/1.5  112.5kg  2/2.5 P2 LUE AVF  Hep 5000   - mircera 100 ordered, not given yet   - calc 1.5 ug tiw     Assessment/ Plan: Uncont HTN - no vol overload on exam, didn't tolerate UF on 1/24 w/ cramping early. BP's better.  ESRD - usual HD TTS. Missed 2 wks HD, pt homeless sleeping behind a commercial building. HD today.  Psych - has PTSD, bipolar, others per pt. On abilify and neurontin, seen by psych, appreciate assistance.  Anemia ckd - Hb 10.5, no esa needs MBD ckd - phos high 8's, Ca in range, cont vdra, binders     Rob Legacy [place] 12/25/2021, 10:38 AM   Recent Labs  Lab 12/23/21 0416 12/24/21 1459 12/25/21 0702  K 4.9 4.3 4.8  BUN 123* 81* 87*  CREATININE 22.88* 17.25* 17.08*  CALCIUM 7.4* 7.9* 7.8*  PHOS  --   --  8.4*  HGB 10.5* 10.1*  --     Inpatient medications:  ARIPiprazole  5 mg Oral Daily   calcitRIOL  1.5 mcg Oral Q T,Th,Sa-HD   calcium acetate  667 mg Oral TID WC   Chlorhexidine Gluconate Cloth  6 each Topical Q0600   gabapentin  300 mg Oral TID   heparin  5,000 Units Subcutaneous Q8H   losartan  50 mg Oral Daily   multivitamin  1 tablet Oral QHS   pantoprazole  40 mg Oral Daily   sodium chloride flush  3 mL Intravenous Q12H    acetaminophen **OR** acetaminophen, calcium carbonate (dosed in mg elemental calcium), camphor-menthol  **AND** hydrOXYzine, docusate sodium, feeding supplement (NEPRO CARB STEADY), hydrALAZINE, LORazepam, nicotine, ondansetron **OR** ondansetron (ZOFRAN) IV, sorbitol, traMADol, zolpidem

## 2021-12-26 LAB — RENAL FUNCTION PANEL
Albumin: 2.7 g/dL — ABNORMAL LOW (ref 3.5–5.0)
Anion gap: 8 (ref 5–15)
BUN: 37 mg/dL — ABNORMAL HIGH (ref 6–20)
CO2: 25 mmol/L (ref 22–32)
Calcium: 8.2 mg/dL — ABNORMAL LOW (ref 8.9–10.3)
Chloride: 102 mmol/L (ref 98–111)
Creatinine, Ser: 10.15 mg/dL — ABNORMAL HIGH (ref 0.44–1.00)
GFR, Estimated: 5 mL/min — ABNORMAL LOW (ref >60)
Glucose, Bld: 100 mg/dL — ABNORMAL HIGH (ref 70–99)
Phosphorus: 5.9 mg/dL — ABNORMAL HIGH (ref 2.5–4.6)
Potassium: 3.9 mmol/L (ref 3.5–5.1)
Sodium: 135 mmol/L (ref 135–145)

## 2021-12-26 LAB — GC/CHLAMYDIA PROBE AMP (~~LOC~~) NOT AT ARMC
Chlamydia: NEGATIVE
Comment: NEGATIVE
Comment: NORMAL
Neisseria Gonorrhea: POSITIVE — AB

## 2021-12-26 LAB — RPR: RPR Ser Ql: NONREACTIVE

## 2021-12-26 MED ORDER — ARIPIPRAZOLE 15 MG PO TABS: 7.5000 mg | ORAL_TABLET | Freq: Every day | ORAL | 1 refills | Status: DC

## 2021-12-26 NOTE — Progress Notes (Signed)
[facility] Progress Note  Subjective: pt seen in room, no new c/o's  Vitals:   12/26/21 0007 12/26/21 0337 12/26/21 0848 12/26/21 1104  BP:  (!) 158/88 (!) 157/95 (!) 154/90  Pulse:  78 88 89  Resp:  20 18 17   Temp:  98.7 F (37.1 C) 98.5 F (36.9 C) 98.1 F (36.7 C)  TempSrc:  Oral Oral Oral  SpO2:  98% 98% 98%  Weight: 110 kg       Exam:  alert, nad   no jvd  Chest cta bilat  Cor reg no RG  Abd soft ntnd no ascites   Ext no LE edema   Alert, NF, ox3    LUE aVF+bruit     Home meds include - abilify, phoslo 1 tid, neurontin 100 bid vs 300 tid, losartan 25 , prilosec, prns/ vits/ supps        OP HD: TTS [facility]   3.5h  350/1.5  112.5kg  2/2.5 P2 LUE AVF  Hep 5000   - mircera 100 ordered, not given yet   - calc 1.5 ug tiw     Assessment/ Plan: Uremia - missed about 2 wks of HD. Better now after HD x 2.  Uncont HTN - no vol overload on exam, BP's much better.  ESRD - usual HD TTS. Missed 2 wks HD, pt is homeless. HD tomorrow.  Psych - has PTSD, bipolar, others per pt. On abilify and neurontin, seen by psych, appreciate assistance.  Anemia ckd - Hb 10.5, no esa needs MBD ckd - phos high 8's, Ca in range, cont vdra, binders     Natasha Long 12/26/2021, 12:49 PM   Recent Labs  Lab 12/23/21 0416 12/24/21 1459 12/25/21 0702 12/26/21 0320  K 4.9 4.3 4.8 3.9  BUN 123* 81* 87* 37*  CREATININE 22.88* 17.25* 17.08* 10.15*  CALCIUM 7.4* 7.9* 7.8* 8.2*  PHOS  --   --  8.4* 5.9*  HGB 10.5* 10.1*  --   --     Inpatient medications:  ARIPiprazole  7.5 mg Oral Daily   calcitRIOL  1.5 mcg Oral Q T,Th,Sa-HD   calcium acetate  667 mg Oral TID WC   Chlorhexidine Gluconate Cloth  6 each Topical Q0600   gabapentin  300 mg Oral TID   heparin  5,000 Units Subcutaneous Q8H   losartan  50 mg Oral Daily   multivitamin  1 tablet Oral QHS   pantoprazole  40 mg Oral Daily   sodium chloride flush  3 mL Intravenous Q12H    acetaminophen **OR** acetaminophen,  calcium carbonate (dosed in mg elemental calcium), camphor-menthol **AND** hydrOXYzine, docusate sodium, feeding supplement (NEPRO CARB STEADY), hydrALAZINE, LORazepam, nicotine, ondansetron **OR** ondansetron (ZOFRAN) IV, sorbitol, traMADol, zolpidem

## 2021-12-26 NOTE — Progress Notes (Signed)
Patient wanting to be discharged ASAP. Notified MD. Patient took IV out herself. Threw monitor across room. Left before discharge instructions could be discussed. Walked out alone.

## 2021-12-26 NOTE — Progress Notes (Signed)
D/C order noted. Contacted [facility] to make clinic aware of pt's d/c today and that pt may/may not resume care tomorrow.  Melven Sartorius Renal Navigator [phone]

## 2021-12-26 NOTE — Progress Notes (Signed)
Mobility Specialist Progress Note:   12/26/21 1300  Mobility  Activity Ambulated independently in hallway  Level of Assistance Independent  Assistive Device None  Distance Ambulated (ft) 1500 ft  Activity Response Tolerated well  $Mobility charge 1 Mobility   Pt received in doorway asking to walk. No complaints of pain and asymptomatic. Pt Left in bed with call bell in reach and all needs met.   [facility] Public librarian Phone [phone] Secondary Phone [phone]

## 2021-12-26 NOTE — Discharge Summary (Signed)
Physician Discharge Summary  Kyndal Heringer MGQ:[id number] DOB: Mar 31, 1996 DOA: 12/23/2021  PCP: [name], MD  Admit date: 12/23/2021 Discharge date: 12/26/2021  Admitted From: Home  Disposition:  Home /Hotel  Recommendations for Outpatient Follow-up:  Follow up with PCP in 1-2 weeks Please obtain BMP/CBC in one week Follow STD results.    Home Health: None  Discharge Condition: Stable.  CODE STATUS: Full code Diet recommendation: Heart Healthy   Brief/Interim Summary: 26 year old with past medical history significant for borderline Personality Disorder, hypertension, PTSD, ESRD on hemodialysis TTS presented with chest pain.  Patient was quite somnolent at the time of evaluation by admitting physician.  She missed hemodialysis for some time due to transportation issues.   Presented with chest pain for 1 week.  Missing hemodialysis due to transportation issues.  She is homeless.  Troponin mildly elevated 89----95.  She was a started on labetalol infusion, treating for hypertensive crisis.  Cardiology and nephrology consulted and following  1-Acute on chronic Diastolic HF; Hypervolemia associated with Renal Insufficiency: -She has not been recently attending hemodialysis sessions. -Nephrology consulted, she had hemodialysis 1/24.  -she will need HD tomorrow.    2-Elevation of troponin: Cardiology consulted and recommended echo. Plan to control blood pressure Likely demand ischemia from hypertension Chest pain free.    Tobacco use: Encourage cessation   ESRD on hemodialysis TTS Nephrology consulted.   Morbid obesity: BMI 38 Needs Lifestyle modifications   Moderate cannabis use disorder: Encourage cessation   Borderline personality disorder Continue with Abilify-Neurontin As needed hydroxyzine She was agitated in the ED, low-dose Ativan as needed ordered.  Psych has been consulted She was not taking Abilify out patient. Resume.  Appreciate Psych assistance.  Recommend SW to help with placement to [facility], she will also need help with transportation to HD.  [facility] in [place] only take female.  Patient provided with results.  She will go to hotel, she was provided information for transportation for HD>   Chronic Hypertension: Continue with Cozaar. She was transiently on labetalol infusion.   Screening;  Checking  for STD, results pending HIV negative   Estimated body mass index is 37.39 kg/m as calculated from the following:   Height as of 06/20/21: 5\' 8"  (1.727 m).   Weight as of this encounter: 111.5 kg.   Discharge Diagnoses:  Principal Problem:   Hypervolemia associated with renal insufficiency Active Problems:   Chronic hypertension   Borderline personality disorder (HCC)   Moderate cannabis use disorder (HCC)   Morbid obesity (HCC)   Cocaine use, 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[place])   ESRD on dialysis ([place])   Tobacco use   Elevated troponin   Chest pain    Discharge Instructions  Discharge Instructions     Diet - low sodium heart healthy   Complete by: As directed    Increase activity slowly   Complete by: As directed       Allergies as of 12/26/2021       Reactions   Prozac [fluoxetine Hcl] Other (See Comments)   Panic attack    Wellbutrin [bupropion] Other (See Comments)   Panic attack   Prednisone Other (See Comments)   Pt states that this med caused pancreatitis.         Medication List     STOP taking these medications    torsemide 100 MG tablet Commonly known as: DEMADEX       TAKE these medications    acetaminophen 650 MG CR tablet Commonly known  as: TYLENOL Take 650-1,300 mg by mouth every 8 (eight) hours as needed for pain.   ARIPiprazole 15 MG tablet Commonly known as: ABILIFY Take 0.5 tablets (7.5 mg total) by mouth daily. Start taking on: December 27, 2021 What changed:  medication strength how much to take   Blood Pressure Monitor 7 Devi 1 Units by Does not apply  route daily. Measure blood pressure daily   calcitRIOL 0.25 MCG capsule Commonly known as: ROCALTROL Take 0.25 mcg by mouth See admin instructions. Dialysis days- Tuesday,Thursday,saturday   calcium acetate 667 MG capsule Commonly known as: PHOSLO Take 667 mg by mouth 3 (three) times daily.   gabapentin 600 MG tablet Commonly known as: NEURONTIN Take 0.5 tablets (300 mg total) by mouth 3 (three) times daily.   hydrOXYzine 25 MG capsule Commonly known as: VISTARIL Take 1 capsule (25 mg total) by mouth every 8 (eight) hours as needed. What changed: reasons to take this   lidocaine-prilocaine cream Commonly known as: EMLA Apply 1 application topically See admin instructions. Prior to dialysis- Tuesday,Thursday,saturday   losartan 50 MG tablet Commonly known as: COZAAR Take 50 mg by mouth daily.   omeprazole 20 MG capsule Commonly known as: PRILOSEC Take 1 capsule (20 mg total) by mouth daily.   ondansetron 4 MG disintegrating tablet Commonly known as: Zofran ODT Take 1 tablet (4 mg total) by mouth every 8 (eight) hours as needed for nausea or vomiting.   Renal Vitamin 0.8 MG Tabs Take 1 tablet by mouth daily.        Allergies  Allergen Reactions   Prozac [Fluoxetine Hcl] Other (See Comments)    Panic attack    Wellbutrin [Bupropion] Other (See Comments)    Panic attack   Prednisone Other (See Comments)    Pt states that this med caused pancreatitis.     Consultations: Psych Nephrology    Procedures/Studies: DG Chest 2 View  Result Date: 12/23/2021 CLINICAL DATA:  Chest pain, dyspnea EXAM: CHEST - 2 VIEW COMPARISON:  06/20/2021 FINDINGS: Minimal right basilar scarring again noted. The lungs are otherwise clear. No pneumothorax or pleural effusion. Cardiac size within normal limits. Pulmonary vascularity is normal. No acute bone abnormality. IMPRESSION: No active cardiopulmonary disease. Electronically Signed   By: Fidela Salisbury M.D.   On: 12/23/2021 05:02    ECHOCARDIOGRAM COMPLETE  Result Date: 12/24/2021    ECHOCARDIOGRAM REPORT   Patient Name:   RUBERTA HOLCK Date of Exam: 12/24/2021 Medical Rec #:  [id number]         Height:       68.0 in Accession #:    [id number]        Weight:       254.6 lb Date of Birth:  March 05, 1996         BSA:          2.265 m Patient Age:    25 years          BP:           155/54 mmHg Patient Gender: F                 HR:           71 bpm. Exam Location:  Inpatient Procedure: 2D Echo, Color Doppler and Cardiac Doppler Indications:    R07.9* Chest pain, unspecified  History:        Patient has prior history of Echocardiogram examinations, most  recent 05/22/2021. Risk Factors:Hypertension and H/o                 polysubstance abuse. Prior echo performed at [facility].  Sonographer:    Raquel Sarna Senior RDCS Referring Phys: [place]  1. Left ventricular ejection fraction, by estimation, is 60 to 65%. The left ventricle has normal function. The left ventricle has no regional wall motion abnormalities. There is moderate left ventricular hypertrophy. Left ventricular diastolic parameters are consistent with Grade II diastolic dysfunction (pseudonormalization).  2. Right ventricular systolic function is normal. The right ventricular size is normal. There is mildly elevated pulmonary artery systolic pressure. The estimated right ventricular systolic pressure is 09.3 mmHg.  3. Left atrial size was mildly dilated.  4. Right atrial size was mildly dilated.  5. The mitral valve is normal in structure. Moderate mitral valve regurgitation, posteriorly-directed. No evidence of mitral stenosis.  6. The aortic valve is tricuspid. Aortic valve regurgitation is not visualized. Aortic valve mean gradient measures 12.0 mmHg. Though the mean gradient is elevated, the valve opens well. Suspect the elevated gradient is related to high flow.  7. The inferior vena cava is normal in size with greater than 50% respiratory  variability, suggesting right atrial pressure of 3 mmHg. FINDINGS  Left Ventricle: Left ventricular ejection fraction, by estimation, is 60 to 65%. The left ventricle has normal function. The left ventricle has no regional wall motion abnormalities. The left ventricular internal cavity size was normal in size. There is  moderate left ventricular hypertrophy. Left ventricular diastolic parameters are consistent with Grade II diastolic dysfunction (pseudonormalization). Right Ventricle: The right ventricular size is normal. No increase in right ventricular wall thickness. Right ventricular systolic function is normal. There is mildly elevated pulmonary artery systolic pressure. The tricuspid regurgitant velocity is 3.08  m/s, and with an assumed right atrial pressure of 3 mmHg, the estimated right ventricular systolic pressure is 23.5 mmHg. Left Atrium: Left atrial size was mildly dilated. Right Atrium: Right atrial size was mildly dilated. Pericardium: There is no evidence of pericardial effusion. Mitral Valve: The mitral valve is normal in structure. Moderate mitral valve regurgitation. No evidence of mitral valve stenosis. Tricuspid Valve: The tricuspid valve is normal in structure. Tricuspid valve regurgitation is mild. Aortic Valve: The aortic valve is tricuspid. Aortic valve regurgitation is not visualized. Aortic valve mean gradient measures 12.0 mmHg. Aortic valve peak gradient measures 20.6 mmHg. Aortic valve area, by VTI measures 2.43 cm. Pulmonic Valve: The pulmonic valve was normal in structure. Pulmonic valve regurgitation is trivial. Aorta: The aortic root is normal in size and structure. Venous: The inferior vena cava is normal in size with greater than 50% respiratory variability, suggesting right atrial pressure of 3 mmHg. IAS/Shunts: No atrial level shunt detected by color flow Doppler.  LEFT VENTRICLE PLAX 2D LVIDd:         5.60 cm   Diastology LVIDs:         3.90 cm   LV e' medial:    7.20 cm/s LV  PW:         1.50 cm   LV E/e' medial:  19.0 LV IVS:        1.10 cm   LV e' lateral:   9.45 cm/s LVOT diam:     2.10 cm   LV E/e' lateral: 14.5 LV SV:         103 LV SV Index:   46 LVOT Area:     3.46 cm  RIGHT VENTRICLE RV S prime:     15.40 cm/s TAPSE (M-mode): 2.5 cm LEFT ATRIUM             Index        RIGHT ATRIUM           Index LA diam:        3.80 cm 1.68 cm/m   RA Area:     20.20 cm LA Vol (A2C):   45.9 ml 20.27 ml/m  RA Volume:   58.60 ml  25.88 ml/m LA Vol (A4C):   56.3 ml 24.86 ml/m LA Biplane Vol: 51.3 ml 22.65 ml/m  AORTIC VALVE AV Area (Vmax):    2.23 cm AV Area (Vmean):   2.43 cm AV Area (VTI):     2.43 cm AV Vmax:           227.00 cm/s AV Vmean:          164.000 cm/s AV VTI:            0.425 m AV Peak Grad:      20.6 mmHg AV Mean Grad:      12.0 mmHg LVOT Vmax:         146.00 cm/s LVOT Vmean:        115.000 cm/s LVOT VTI:          0.298 m LVOT/AV VTI ratio: 0.70  AORTA Ao Root diam: 2.60 cm Ao Asc diam:  2.70 cm MITRAL VALVE                TRICUSPID VALVE MV Area (PHT): 2.28 cm     TR Peak grad:   37.9 mmHg MV Decel Time: 333 msec     TR Vmax:        308.00 cm/s MV E velocity: 137.00 cm/s MV A velocity: 46.00 cm/s   SHUNTS MV E/A ratio:  2.98         Systemic VTI:  0.30 m                             Systemic Diam: 2.10 cm Dalton McleanMD Electronically signed by Franki Monte Signature Date/Time: 12/24/2021/6:12:33 PM    Final      Subjective: Denies chest pain   Discharge Exam: Vitals:   12/26/21 0848 12/26/21 1104  BP: (!) 157/95 (!) 154/90  Pulse: 88 89  Resp: 18 17  Temp: 98.5 F (36.9 C) 98.1 F (36.7 C)  SpO2: 98% 98%     General: Pt is alert, awake, not in acute distress Cardiovascular: RRR, S1/S2 +, no rubs, no gallops Respiratory: CTA bilaterally, no wheezing, no rhonchi Abdominal: Soft, NT, ND, bowel sounds + Extremities: no edema, no cyanosis    The results of significant diagnostics from this hospitalization (including imaging, microbiology,  ancillary and laboratory) are listed below for reference.     Microbiology: Recent Results (from the past 240 hour(s))  Resp Panel by RT-PCR (Flu A&B, Covid) Nasopharyngeal Swab     Status: None   Collection Time: 12/23/21 12:45 PM   Specimen: Nasopharyngeal Swab; Nasopharyngeal(NP) swabs in vial transport medium  Result Value Ref Range Status   SARS Coronavirus 2 by RT PCR NEGATIVE NEGATIVE Final    Comment: (NOTE) SARS-CoV-2 target nucleic acids are NOT DETECTED.  The SARS-CoV-2 RNA is generally detectable in upper respiratory specimens during the acute phase of infection. The lowest concentration of SARS-CoV-2 viral copies this assay can detect is 138 copies/mL.  A negative result does not preclude SARS-Cov-2 infection and should not be used as the sole basis for treatment or other patient management decisions. A negative result may occur with  improper specimen collection/handling, submission of specimen other than nasopharyngeal swab, presence of viral mutation(s) within the areas targeted by this assay, and inadequate number of viral copies(<138 copies/mL). A negative result must be combined with clinical observations, patient history, and epidemiological information. The expected result is Negative.  Fact Sheet for Patients:  EntrepreneurPulse.com.au  Fact Sheet for Healthcare Providers:  IncredibleEmployment.be  This test is no t yet approved or cleared by the [place] FDA and  has been authorized for detection and/or diagnosis of SARS-CoV-2 by FDA under an Emergency Use Authorization (EUA). This EUA will remain  in effect (meaning this test can be used) for the duration of the COVID-19 declaration under Section 564(b)(1) of the Act, 21 U.S.C.section 360bbb-3(b)(1), unless the authorization is terminated  or revoked sooner.       Influenza A by PCR NEGATIVE NEGATIVE Final   Influenza B by PCR NEGATIVE NEGATIVE Final     Comment: (NOTE) The Xpert Xpress SARS-CoV-2/FLU/RSV plus assay is intended as an aid in the diagnosis of influenza from Nasopharyngeal swab specimens and should not be used as a sole basis for treatment. Nasal washings and aspirates are unacceptable for Xpert Xpress SARS-CoV-2/FLU/RSV testing.  Fact Sheet for Patients: EntrepreneurPulse.com.au  Fact Sheet for Healthcare Providers: IncredibleEmployment.be  This test is not yet approved or cleared by the [place] FDA and has been authorized for detection and/or diagnosis of SARS-CoV-2 by FDA under an Emergency Use Authorization (EUA). This EUA will remain in effect (meaning this test can be used) for the duration of the COVID-19 declaration under Section 564(b)(1) of the Act, 21 U.S.C. section 360bbb-3(b)(1), unless the authorization is terminated or revoked.  Performed at [facility], [place] [address], [place], [place] [address]      Labs: BNP (last 3 results) Recent Labs    12/23/21 0416  BNP 488.8*   Basic Metabolic Panel: Recent Labs  Lab 12/23/21 0416 12/24/21 1459 12/25/21 0702 12/26/21 0320  NA 142 135 136 135  K 4.9 4.3 4.8 3.9  CL 108 104 105 102  CO2 15* 18* 18* 25  GLUCOSE 99 114* 94 100*  BUN 123* 81* 87* 37*  CREATININE 22.88* 17.25* 17.08* 10.15*  CALCIUM 7.4* 7.9* 7.8* 8.2*  MG  --  2.4  --   --   PHOS  --   --  8.4* 5.9*   Liver Function Tests: Recent Labs  Lab 12/25/21 0702 12/26/21 0320  ALBUMIN 2.6* 2.7*   No results for input(s): LIPASE, AMYLASE in the last 168 hours. No results for input(s): AMMONIA in the last 168 hours. CBC: Recent Labs  Lab 12/23/21 0416 12/24/21 1459  WBC 11.2* 8.4  NEUTROABS 9.1*  --   HGB 10.5* 10.1*  HCT 32.2* 32.0*  MCV 93.9 95.5  PLT 167 152   Cardiac Enzymes: No results for input(s): CKTOTAL, CKMB, CKMBINDEX, TROPONINI in the last 168 hours. BNP: Invalid input(s): POCBNP CBG: No results for input(s):  GLUCAP in the last 168 hours. D-Dimer No results for input(s): DDIMER in the last 72 hours. Hgb A1c Recent Labs    12/24/21 1459  HGBA1C 5.4   Lipid Profile Recent Labs    12/24/21 1459  CHOL 111  HDL 39*  LDLCALC 54  TRIG 90  CHOLHDL 2.8   Thyroid function studies No  results for input(s): TSH, T4TOTAL, T3FREE, THYROIDAB in the last 72 hours.  Invalid input(s): FREET3 Anemia work up No results for input(s): VITAMINB12, FOLATE, FERRITIN, TIBC, IRON, RETICCTPCT in the last 72 hours. Urinalysis    Component Value Date/Time   COLORURINE YELLOW 06/02/2021 1221   APPEARANCEUR CLOUDY (A) 06/02/2021 1221   LABSPEC 1.010 06/02/2021 1221   PHURINE 7.0 06/02/2021 1221   GLUCOSEU 50 (A) 06/02/2021 1221   HGBUR NEGATIVE 06/02/2021 1221   BILIRUBINUR NEGATIVE 06/02/2021 1221   BILIRUBINUR negative 01/30/2020 1526   BILIRUBINUR neg 09/15/2017 1657   KETONESUR NEGATIVE 06/02/2021 1221   PROTEINUR 100 (A) 06/02/2021 1221   UROBILINOGEN 0.2 01/30/2020 1526   NITRITE NEGATIVE 06/02/2021 1221   LEUKOCYTESUR NEGATIVE 06/02/2021 1221   Sepsis Labs Invalid input(s): PROCALCITONIN,  WBC,  LACTICIDVEN Microbiology Recent Results (from the past 240 hour(s))  Resp Panel by RT-PCR (Flu A&B, Covid) Nasopharyngeal Swab     Status: None   Collection Time: 12/23/21 12:45 PM   Specimen: Nasopharyngeal Swab; Nasopharyngeal(NP) swabs in vial transport medium  Result Value Ref Range Status   SARS Coronavirus 2 by RT PCR NEGATIVE NEGATIVE Final    Comment: (NOTE) SARS-CoV-2 target nucleic acids are NOT DETECTED.  The SARS-CoV-2 RNA is generally detectable in upper respiratory specimens during the acute phase of infection. The lowest concentration of SARS-CoV-2 viral copies this assay can detect is 138 copies/mL. A negative result does not preclude SARS-Cov-2 infection and should not be used as the sole basis for treatment or other patient management decisions. A negative result may occur with   improper specimen collection/handling, submission of specimen other than nasopharyngeal swab, presence of viral mutation(s) within the areas targeted by this assay, and inadequate number of viral copies(<138 copies/mL). A negative result must be combined with clinical observations, patient history, and epidemiological information. The expected result is Negative.  Fact Sheet for Patients:  EntrepreneurPulse.com.au  Fact Sheet for Healthcare Providers:  IncredibleEmployment.be  This test is no t yet approved or cleared by the [place] FDA and  has been authorized for detection and/or diagnosis of SARS-CoV-2 by FDA under an Emergency Use Authorization (EUA). This EUA will remain  in effect (meaning this test can be used) for the duration of the COVID-19 declaration under Section 564(b)(1) of the Act, 21 U.S.C.section 360bbb-3(b)(1), unless the authorization is terminated  or revoked sooner.       Influenza A by PCR NEGATIVE NEGATIVE Final   Influenza B by PCR NEGATIVE NEGATIVE Final    Comment: (NOTE) The Xpert Xpress SARS-CoV-2/FLU/RSV plus assay is intended as an aid in the diagnosis of influenza from Nasopharyngeal swab specimens and should not be used as a sole basis for treatment. Nasal washings and aspirates are unacceptable for Xpert Xpress SARS-CoV-2/FLU/RSV testing.  Fact Sheet for Patients: EntrepreneurPulse.com.au  Fact Sheet for Healthcare Providers: IncredibleEmployment.be  This test is not yet approved or cleared by the [place] FDA and has been authorized for detection and/or diagnosis of SARS-CoV-2 by FDA under an Emergency Use Authorization (EUA). This EUA will remain in effect (meaning this test can be used) for the duration of the COVID-19 declaration under Section 564(b)(1) of the Act, 21 U.S.C. section 360bbb-3(b)(1), unless the authorization is terminated  or revoked.  Performed at [facility], [place] [address], [place], [place] [address]      Time coordinating discharge: 40 minutes  SIGNED:   Elmarie Shiley, MD  [facility]

## 2021-12-26 NOTE — Social Work (Signed)
CSW spoke with Natasha Long about DC plan, Natasha Long stated she would like to go to [facility]. CSW asked if she had funds to cover the costs, Natasha Long states she knows the costs will be 700 dollars a month. Natasha Long is homeless and will need transportation.

## 2021-12-26 NOTE — Social Work (Addendum)
CSW contacted [facility] for pt to DC, CSW was informed that the [place] facility is Female only, the closest facility is in [place], [place] then contacted [facility] in [place] for bed availability. There are no beds and they will not have anything open for females for another week. CSW contacted [facility] and they will not accept pt because of her HD. CSW they contacted [facility] an they also do not take pt with HD.  CSW discussed the barrier for placement with the pt and inquired about shelters for placement. Pt asked if CSW could give her a list of cheap hotels that are less than 700 a month bc that is all she gets a month. CSW provided pt with hotels near her HD clinic to be closer.  Pt stated that her check is still pending and she will not have money until Monday. CSW asked pt if she would like CSW to contact shelters pt declined. Pt is already signed up with medicaid transport and just needs to call and get back on the schedule with an address, CSW informed pt of the transportation in room. Pt is very agitated, cussing slamming drawers and called her bf to come get her because she states she cant get anymore Ativan and needs it, also she says she needs a cigarette. Pt discussed bf coming to pick her up on speaker, pt became more agitated once bf explained he could pick her up in an hour but not right now bc he is at work. Pt stated she is just going to leave, CSW offered transportation and a bus pass pt declined.

## 2021-12-27 ENCOUNTER — Emergency Department (HOSPITAL_COMMUNITY)
Admission: EM | Admit: 2021-12-27 | Discharge: 2021-12-27 | Disposition: A | Discharge status: Home / Self Care | Payer: Medicaid Other | Attending: Emergency Medicine | Admitting: Emergency Medicine

## 2021-12-27 ENCOUNTER — Other Ambulatory Visit: Payer: Self-pay

## 2021-12-27 ENCOUNTER — Telehealth: Payer: Self-pay | Admitting: Nurse Practitioner

## 2021-12-27 DIAGNOSIS — R531 Weakness: Secondary | ICD-10-CM | POA: Insufficient documentation

## 2021-12-27 DIAGNOSIS — Z5321 Procedure and treatment not carried out due to patient leaving prior to being seen by health care provider: Secondary | ICD-10-CM | POA: Insufficient documentation

## 2021-12-27 DIAGNOSIS — Z992 Dependence on renal dialysis: Secondary | ICD-10-CM | POA: Diagnosis not present

## 2021-12-27 DIAGNOSIS — R42 Dizziness and giddiness: Secondary | ICD-10-CM | POA: Diagnosis not present

## 2021-12-27 DIAGNOSIS — I5032 Chronic diastolic (congestive) heart failure: Secondary | ICD-10-CM | POA: Insufficient documentation

## 2021-12-27 DIAGNOSIS — A549 Gonococcal infection, unspecified: Secondary | ICD-10-CM | POA: Diagnosis present

## 2021-12-27 DIAGNOSIS — N186 End stage renal disease: Secondary | ICD-10-CM | POA: Diagnosis not present

## 2021-12-27 DIAGNOSIS — R55 Syncope and collapse: Secondary | ICD-10-CM | POA: Insufficient documentation

## 2021-12-27 DIAGNOSIS — Z79899 Other long term (current) drug therapy: Secondary | ICD-10-CM | POA: Diagnosis not present

## 2021-12-27 DIAGNOSIS — I959 Hypotension, unspecified: Secondary | ICD-10-CM | POA: Insufficient documentation

## 2021-12-27 LAB — CBC
HCT: 33.4 % — ABNORMAL LOW (ref 36.0–46.0)
Hemoglobin: 10.2 g/dL — ABNORMAL LOW (ref 12.0–15.0)
MCH: 29.6 pg (ref 26.0–34.0)
MCHC: 30.5 g/dL (ref 30.0–36.0)
MCV: 96.8 fL (ref 80.0–100.0)
Platelets: 173 10*3/uL (ref 150–400)
RBC: 3.45 MIL/uL — ABNORMAL LOW (ref 3.87–5.11)
RDW: 14.1 % (ref 11.5–15.5)
WBC: 9 10*3/uL (ref 4.0–10.5)
nRBC: 0 % (ref 0.0–0.2)

## 2021-12-27 LAB — BASIC METABOLIC PANEL
Anion gap: 13 (ref 5–15)
BUN: 57 mg/dL — ABNORMAL HIGH (ref 6–20)
CO2: 22 mmol/L (ref 22–32)
Calcium: 8.5 mg/dL — ABNORMAL LOW (ref 8.9–10.3)
Chloride: 106 mmol/L (ref 98–111)
Creatinine, Ser: 13.65 mg/dL — ABNORMAL HIGH (ref 0.44–1.00)
GFR, Estimated: 3 mL/min — ABNORMAL LOW (ref >60)
Glucose, Bld: 81 mg/dL (ref 70–99)
Potassium: 4.8 mmol/L (ref 3.5–5.1)
Sodium: 141 mmol/L (ref 135–145)

## 2021-12-27 MED ORDER — LIDOCAINE HCL (PF) 1 % IJ SOLN
INTRAMUSCULAR | Status: AC
  Administered 2021-12-27: 1 mL
  Filled 2021-12-27: qty 5

## 2021-12-27 MED ORDER — AZITHROMYCIN 1 G PO PACK
1.0000 g | PACK | Freq: Once | ORAL | Status: AC
Start: 2021-12-27 — End: 2021-12-27
  Administered 2021-12-27: 1 g via ORAL
  Filled 2021-12-27: qty 1

## 2021-12-27 MED ORDER — CEFTRIAXONE SODIUM 500 MG IJ SOLR
500.0000 mg | Freq: Once | INTRAMUSCULAR | Status: AC
  Administered 2021-12-27: 500 mg via INTRAMUSCULAR
  Filled 2021-12-27: qty 500

## 2021-12-27 NOTE — ED Provider Notes (Signed)
[facility] DEPARTMENT Provider Note   CSN: [id number] Arrival date & time: 12/27/21  1419     History  Chief Complaint  Patient presents with   Near Syncope   Weakness    Natasha Long is a 26 y.o. female.  The patient presents to the emergency department today complaining of an episode of hypotension while getting ready to begin her dialysis session.  She reports feeling lightheaded and reports a blood pressure of 80/42.  She has past medical history significant for diastolic heart failure, borderline personality disorder hypertension, PTSD, end-stage renal disease on dialysis.  The patient has also requested treatment for gonorrhea which was diagnosed after hospital admission from when she was discharged yesterday for acute on chronic heart failure.   HPI     Home Medications Prior to Admission medications   Medication Sig Start Date End Date Taking? Authorizing Provider  acetaminophen (TYLENOL) 650 MG CR tablet Take 650-1,300 mg by mouth every 8 (eight) hours as needed for pain.    [provider]  ARIPiprazole (ABILIFY) 15 MG tablet Take 0.5 tablets (7.5 mg total) by mouth daily. 12/27/21   Regalado, Belkys A, MD  B Complex-C-Folic Acid (RENAL VITAMIN) 0.8 MG TABS Take 1 tablet by mouth daily. 10/29/21   [provider]  Blood Pressure Monitoring (BLOOD PRESSURE MONITOR 7) DEVI 1 Units by Does not apply route daily. Measure blood pressure daily 02/29/20   [name], MD  calcitRIOL (ROCALTROL) 0.25 MCG capsule Take 0.25 mcg by mouth See admin instructions. Dialysis days- Tuesday,Thursday,saturday 09/25/20   [provider]  calcium acetate (PHOSLO) 667 MG capsule Take 667 mg by mouth 3 (three) times daily. 07/03/21   [provider]  gabapentin (NEURONTIN) 600 MG tablet Take 0.5 tablets (300 mg total) by mouth 3 (three) times daily. 10/31/21   [name], MD  hydrOXYzine (VISTARIL) 25 MG capsule Take 1  capsule (25 mg total) by mouth every 8 (eight) hours as needed. Patient taking differently: Take 25 mg by mouth every 8 (eight) hours as needed for anxiety. 08/11/21   [name], MD  lidocaine-prilocaine (EMLA) cream Apply 1 application topically See admin instructions. Prior to dialysis- Tuesday,Thursday,saturday 03/06/21   [provider]  losartan (COZAAR) 50 MG tablet Take 50 mg by mouth daily. 12/11/21   [provider]  omeprazole (PRILOSEC) 20 MG capsule Take 1 capsule (20 mg total) by mouth daily. 08/11/21   [name], MD  ondansetron (ZOFRAN ODT) 4 MG disintegrating tablet Take 1 tablet (4 mg total) by mouth every 8 (eight) hours as needed for nausea or vomiting. 03/18/21   Long, Wonda Olds, MD      Allergies    Prozac [fluoxetine hcl], Wellbutrin [bupropion], and Prednisone    Review of Systems   Review of Systems  Constitutional:  Negative for appetite change, diaphoresis and fever.  Respiratory:  Negative for shortness of breath.   Cardiovascular:  Negative for chest pain.  Gastrointestinal:  Negative for abdominal pain, nausea and vomiting.  Skin:  Negative for color change.  Neurological:  Positive for light-headedness. Negative for headaches.   Physical Exam Updated Vital Signs BP (!) 142/68    Pulse 75    Temp 98.7 F (37.1 C) (Oral)    Resp 18    SpO2 100%  Physical Exam Constitutional:      General: She is not in acute distress.    Appearance: She is obese.  HENT:  Head: Normocephalic and atraumatic.  Eyes:     Conjunctiva/sclera: Conjunctivae normal.  Cardiovascular:     Rate and Rhythm: Normal rate and regular rhythm.     Pulses: Normal pulses.  Pulmonary:     Effort: Pulmonary effort is normal.     Breath sounds: Normal breath sounds.  Abdominal:     Palpations: Abdomen is soft.     Tenderness: There is no abdominal tenderness.  Musculoskeletal:     Cervical back: Normal range of motion.  Skin:    General: Skin is warm and  dry.  Neurological:     Mental Status: She is alert.  Psychiatric:        Mood and Affect: Mood normal.    ED Results / Procedures / Treatments   Labs (all labs ordered are listed, but only abnormal results are displayed) Labs Reviewed  BASIC METABOLIC PANEL  CBC    EKG EKG Interpretation  Date/Time:  Saturday December 27 2021 14:25:54 EST Ventricular Rate:  71 PR Interval:  126 QRS Duration: 88 QT Interval:  423 QTC Calculation: 460 R Axis:   31 Text Interpretation: Sinus rhythm Probable left atrial enlargement downsloping st segment in III now resolved No significant change since last tracing Confirmed by Deno Etienne [phone]) on 12/27/2021 2:28:21 PM  Radiology No results found.  Procedures Procedures    Medications Ordered in ED Medications  cefTRIAXone (ROCEPHIN) injection 500 mg (has no administration in time range)  azithromycin (ZITHROMAX) powder 1 g (has no administration in time range)  lidocaine (PF) (XYLOCAINE) 1 % injection (has no administration in time range)    ED Course/ Medical Decision Making/ A&P                           Medical Decision Making Amount and/or Complexity of Data Reviewed Labs: ordered.  Risk Prescription drug management.   Patient signed out at shift change to the oncoming provider who will continue patient care  {Final Clinical Impression(s) / ED Diagnoses Final diagnoses:  Gonorrhea    Rx / DC Orders ED Discharge Orders     None         Dorothyann Peng, [place] 12/27/21 [place], DO 12/28/21 9024

## 2021-12-27 NOTE — ED Notes (Signed)
Pt asked where the Eula Fried was, RN explained we did not have a [employer] here. Pt stated she was going to find food, RN asked if pt was leaving the ED, pt stated "no I'm going to come back." RN attempted to explain that pts can't leave the ED and come back, if she did she would have to check in again. Pt then walked down the hallway, ignoring RN's explanations. Pt left all of, her belongings in the ED room. Pt then returned back to her room 15 min later, grabbed her belongings and asked how to get out. RN asked again if pt was leaving, she stated "yes, I'm not staying here". Dr Darl Householder made aware

## 2021-12-27 NOTE — Telephone Encounter (Signed)
Transition of care contact from inpatient facility  Date of Discharge: 12/26/2021 Date of Contact: 12/27/2021 Method of contact: Phone  Attempted to contact patient to discuss transition of care from inpatient admission. Patient did not answer the phone. Telephone number is not correct. Attempted to get information from OP clinic but the same telephone number is there as well. Cannot find working telephone number for patient.

## 2021-12-27 NOTE — ED Notes (Signed)
Pt given [place] sandwich, crackers & peanut butter

## 2021-12-27 NOTE — ED Triage Notes (Signed)
Pt here via [facility] from dialysis for feeling "faint", lightheaded and pt was hypotensive, per staff BP was 84/32. BP with EMS was 130/72, 100% RA, 70HR

## 2021-12-29 ENCOUNTER — Telehealth: Payer: Self-pay

## 2021-12-29 LAB — URINE CYTOLOGY ANCILLARY ONLY
Comment: NEGATIVE
Trichomonas: POSITIVE — AB

## 2021-12-29 NOTE — Telephone Encounter (Signed)
Does she have updated OV with me soon ?  Your note said 1/24 from last week

## 2021-12-29 NOTE — Telephone Encounter (Signed)
Transition Care Management Follow-up Telephone Call Date of discharge and from where: 12/26/2021, [facility].  Was seen in ED - 1/28/203 but eloped How have you been since you were released from the hospital? She said she is feeling all right Any questions or concerns? No - working on obtaining housing.   Items Reviewed: Did the pt receive and understand the discharge instructions provided? Yes  Medications obtained and verified?  She stated that she has some of her medications and needs to call the pharmacy to refill the others.  Other? No  Any new allergies since your discharge? No  Dietary orders reviewed? No Do you have support at home?  Currently homeless. Staying in a tent. Has support from her boyfriend. She noted that she is working with [employer] to find permanent housing.   Home Care and Equipment/Supplies: Were home health services ordered? no If so, what is the name of the agency? N/a  Has the agency set up a time to come to the patient's home? not applicable Were any new equipment or medical supplies ordered?  No What is the name of the medical supply agency? N/a Were you able to get the supplies/equipment? not applicable Do you have any questions related to the use of the equipment or supplies? No  She has HD T/T/S at [facility].  Emphasized the importance of attending her dialysis sessions as scheduled.   Functional Questionnaire: (I = Independent and D = Dependent) ADLs: independent  Follow up appointments reviewed:  PCP Hospital f/u appt confirmed? Yes  Scheduled to see Dr Joya Gaskins on 12/23/2021.  [facility] f/u appt confirmed?  None scheduled at this time    Are transportation arrangements needed?  Sometimes,  She said that the SW @ HD give her bus passes. Reminded her that Medicaid will provide transportation to medical appointments.  If their condition worsens, is the pt aware to call PCP or go to the Emergency Dept.? Yes Was the  patient provided with contact information for the PCP's office or ED? Yes Was to pt encouraged to call back with questions or concerns? Yes

## 2021-12-31 ENCOUNTER — Other Ambulatory Visit: Payer: Self-pay | Admitting: Critical Care Medicine

## 2021-12-31 NOTE — Telephone Encounter (Signed)
Requested medication (s) are due for refill today: Yes  Requested medication (s) are on the active medication list: Yes  Last refill:  08/11/21  Future visit scheduled: Yes  Notes to clinic:  Protocol indicates pt. Needs lab work.    Requested Prescriptions  Pending Prescriptions Disp Refills   hydrOXYzine (VISTARIL) 25 MG capsule [Pharmacy Med Name: hydroxyzine pamoate 25 mg capsule] 60 capsule 3    Sig: TAKE 1 CAPSULE BY MOUTH EVERY 8 HOURS AS NEEDED     Ear, Nose, and Throat:  Antihistamines 2 Failed - 12/31/2021 11:18 AM      Failed - Cr in normal range and within 360 days    Creatinine, Ser  Date Value Ref Range Status  12/27/2021 13.65 (H) 0.44 - 1.00 mg/dL Final   Creatinine, Urine  Date Value Ref Range Status  08/15/2019 52.97 mg/dL Final          Passed - Valid encounter within last 12 months    Recent Outpatient Visits           4 months ago Suicidal ideations   [place] [name], MD   6 months ago No-show for appointment   [place] [name], MD   1 year ago Vaginal discharge   [place], Deborah B, MD   1 year ago Cutaneous abscess of other site   [place], Patrick E, MD   1 year ago CKD (chronic kidney disease) stage 4, GFR 15-29 ml/min [facility])   [place], MD       Future Appointments             In 1 week Joya Gaskins Burnett Harry, MD [place]

## 2022-01-13 ENCOUNTER — Ambulatory Visit: Payer: Self-pay | Admitting: *Deleted

## 2022-01-13 ENCOUNTER — Ambulatory Visit: Payer: Medicaid Other | Admitting: Critical Care Medicine

## 2022-01-13 NOTE — Telephone Encounter (Signed)
°  Chief Complaint: Vaginal discharge for 3 days Symptoms: yellow discharge with an odor. Frequency: Last 3 days Pertinent Negatives: Patient denies burning or abd pain Disposition: [] ED /[] Urgent Care (no appt availability in office) / [] Appointment(In office/virtual)/ []  [facility]/ [x] Home Care/ [] Refused Recommended Disposition /[x] [place] [facility]/ []  Follow-up with PCP Additional Notes: Has an appt for Monday for the [facility] per pt.    I gave her some suggestions of creams to try from the drug store to help her be more comfortable until her Appt.
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
## 2022-01-13 NOTE — Telephone Encounter (Signed)
I returned pt's call.   She called in c/o vaginal discharge that is yellow and has an odor.  No appts available with [facility].   Reason for Disposition  Abnormal color vaginal discharge (i.e., yellow, green, gray)  Answer Assessment - Initial Assessment Questions 1. DISCHARGE: "Describe the discharge." (e.g., white, yellow, green, gray, foamy, cottage cheese-like)     Yellow vaginal discharge with odor.   No pain or burning 2. ODOR: "Is there a bad odor?"     Fisher odor. 3. ONSET: "When did the discharge begin?"     3 days ago 4. RASH: "Is there a rash in that area?" If Yes, ask: "Describe it." (e.g., redness, blisters, sores, bumps)     No  5. ABDOMINAL PAIN: "Are you having any abdominal pain?" If Yes, ask: "What does it feel like? " (e.g., crampy, dull, intermittent, constant)      No 6. ABDOMINAL PAIN SEVERITY: If present, ask: "How bad is it?"  (e.g., mild, moderate, severe)  - MILD - doesn't interfere with normal activities   - MODERATE - interferes with normal activities or awakens from sleep   - SEVERE - patient doesn't want to move (R/O peritonitis)      No abd pain 7. CAUSE: "What do you think is causing the discharge?" "Have you had the same problem before? What happened then?"     I've had this before 8. OTHER SYMPTOMS: "Do you have any other symptoms?" (e.g., fever, itching, vaginal bleeding, pain with urination, injury to genital area, vaginal foreign body)     See above 9. PREGNANCY: "Is there any chance you are pregnant?" "When was your last menstrual period?"     Not asked  Protocols used: Vaginal Discharge-A-AH

## 2022-01-14 NOTE — Telephone Encounter (Signed)
Fyi.

## 2022-01-14 NOTE — Telephone Encounter (Signed)
Hard to say with out cervical vag swab and she was a no show yesterday  Suggest UC or MMU next week or add on to me tuesday

## 2022-01-15 NOTE — Telephone Encounter (Signed)
Called Natasha Long about mmu, letting her know that kaitlyn will call to book the appointment.

## 2022-01-28 ENCOUNTER — Encounter (HOSPITAL_COMMUNITY): Payer: Self-pay

## 2022-01-28 ENCOUNTER — Emergency Department (HOSPITAL_COMMUNITY): Payer: Medicaid Other

## 2022-01-28 ENCOUNTER — Other Ambulatory Visit: Payer: Self-pay

## 2022-01-28 ENCOUNTER — Inpatient Hospital Stay (HOSPITAL_COMMUNITY)
Admission: EM | Admit: 2022-01-28 | Discharge: 2022-02-16 | DRG: 485 | Disposition: A | Discharge status: Skilled Nursing Facility | Payer: Medicaid Other | Attending: Internal Medicine | Admitting: Internal Medicine

## 2022-01-28 DIAGNOSIS — M25532 Pain in left wrist: Secondary | ICD-10-CM

## 2022-01-28 DIAGNOSIS — Z9115 Patient's noncompliance with renal dialysis: Secondary | ICD-10-CM

## 2022-01-28 DIAGNOSIS — E876 Hypokalemia: Secondary | ICD-10-CM | POA: Diagnosis present

## 2022-01-28 DIAGNOSIS — S83281A Other tear of lateral meniscus, current injury, right knee, initial encounter: Secondary | ICD-10-CM | POA: Diagnosis present

## 2022-01-28 DIAGNOSIS — F329 Major depressive disorder, single episode, unspecified: Secondary | ICD-10-CM | POA: Diagnosis present

## 2022-01-28 DIAGNOSIS — Z20822 Contact with and (suspected) exposure to covid-19: Secondary | ICD-10-CM | POA: Diagnosis present

## 2022-01-28 DIAGNOSIS — F603 Borderline personality disorder: Secondary | ICD-10-CM | POA: Diagnosis present

## 2022-01-28 DIAGNOSIS — M00232 Other streptococcal arthritis, left wrist: Secondary | ICD-10-CM | POA: Diagnosis present

## 2022-01-28 DIAGNOSIS — E669 Obesity, unspecified: Secondary | ICD-10-CM | POA: Diagnosis present

## 2022-01-28 DIAGNOSIS — F17210 Nicotine dependence, cigarettes, uncomplicated: Secondary | ICD-10-CM | POA: Diagnosis present

## 2022-01-28 DIAGNOSIS — E872 Acidosis, unspecified: Secondary | ICD-10-CM | POA: Diagnosis present

## 2022-01-28 DIAGNOSIS — M25569 Pain in unspecified knee: Secondary | ICD-10-CM | POA: Diagnosis present

## 2022-01-28 DIAGNOSIS — R52 Pain, unspecified: Secondary | ICD-10-CM

## 2022-01-28 DIAGNOSIS — G4700 Insomnia, unspecified: Secondary | ICD-10-CM | POA: Diagnosis present

## 2022-01-28 DIAGNOSIS — B954 Other streptococcus as the cause of diseases classified elsewhere: Secondary | ICD-10-CM | POA: Diagnosis present

## 2022-01-28 DIAGNOSIS — M00261 Other streptococcal arthritis, right knee: Principal | ICD-10-CM | POA: Diagnosis present

## 2022-01-28 DIAGNOSIS — Z833 Family history of diabetes mellitus: Secondary | ICD-10-CM

## 2022-01-28 DIAGNOSIS — A5901 Trichomonal vulvovaginitis: Secondary | ICD-10-CM | POA: Diagnosis present

## 2022-01-28 DIAGNOSIS — M009 Pyogenic arthritis, unspecified: Secondary | ICD-10-CM

## 2022-01-28 DIAGNOSIS — Z992 Dependence on renal dialysis: Secondary | ICD-10-CM

## 2022-01-28 DIAGNOSIS — Z8249 Family history of ischemic heart disease and other diseases of the circulatory system: Secondary | ICD-10-CM

## 2022-01-28 DIAGNOSIS — I132 Hypertensive heart and chronic kidney disease with heart failure and with stage 5 chronic kidney disease, or end stage renal disease: Secondary | ICD-10-CM | POA: Diagnosis present

## 2022-01-28 DIAGNOSIS — S86811A Strain of other muscle(s) and tendon(s) at lower leg level, right leg, initial encounter: Secondary | ICD-10-CM | POA: Diagnosis present

## 2022-01-28 DIAGNOSIS — M79669 Pain in unspecified lower leg: Secondary | ICD-10-CM | POA: Diagnosis present

## 2022-01-28 DIAGNOSIS — Z91199 Patient's noncompliance with other medical treatment and regimen due to unspecified reason: Secondary | ICD-10-CM

## 2022-01-28 DIAGNOSIS — F411 Generalized anxiety disorder: Secondary | ICD-10-CM | POA: Diagnosis present

## 2022-01-28 DIAGNOSIS — Z5900 Homelessness unspecified: Secondary | ICD-10-CM

## 2022-01-28 DIAGNOSIS — M1711 Unilateral primary osteoarthritis, right knee: Secondary | ICD-10-CM | POA: Diagnosis present

## 2022-01-28 DIAGNOSIS — D631 Anemia in chronic kidney disease: Secondary | ICD-10-CM | POA: Diagnosis present

## 2022-01-28 DIAGNOSIS — G9340 Encephalopathy, unspecified: Secondary | ICD-10-CM | POA: Diagnosis present

## 2022-01-28 DIAGNOSIS — D62 Acute posthemorrhagic anemia: Secondary | ICD-10-CM | POA: Diagnosis present

## 2022-01-28 DIAGNOSIS — Z87441 Personal history of nephrotic syndrome: Secondary | ICD-10-CM

## 2022-01-28 DIAGNOSIS — M25461 Effusion, right knee: Secondary | ICD-10-CM

## 2022-01-28 DIAGNOSIS — N2581 Secondary hyperparathyroidism of renal origin: Secondary | ICD-10-CM | POA: Diagnosis present

## 2022-01-28 DIAGNOSIS — A539 Syphilis, unspecified: Secondary | ICD-10-CM

## 2022-01-28 DIAGNOSIS — Z6836 Body mass index (BMI) 36.0-36.9, adult: Secondary | ICD-10-CM

## 2022-01-28 DIAGNOSIS — Z79899 Other long term (current) drug therapy: Secondary | ICD-10-CM

## 2022-01-28 DIAGNOSIS — E875 Hyperkalemia: Secondary | ICD-10-CM | POA: Diagnosis present

## 2022-01-28 DIAGNOSIS — N186 End stage renal disease: Secondary | ICD-10-CM | POA: Diagnosis present

## 2022-01-28 DIAGNOSIS — F4310 Post-traumatic stress disorder, unspecified: Secondary | ICD-10-CM | POA: Diagnosis present

## 2022-01-28 DIAGNOSIS — W109XXA Fall (on) (from) unspecified stairs and steps, initial encounter: Secondary | ICD-10-CM | POA: Diagnosis present

## 2022-01-28 DIAGNOSIS — Z9114 Patient's other noncompliance with medication regimen: Secondary | ICD-10-CM

## 2022-01-28 MED ORDER — HYDROCODONE-ACETAMINOPHEN 5-325 MG PO TABS
1.0000 | ORAL_TABLET | Freq: Once | ORAL | Status: AC
  Administered 2022-01-28: 1 via ORAL
  Filled 2022-01-28: qty 1

## 2022-01-28 NOTE — ED Provider Triage Note (Signed)
Emergency Medicine Provider Triage Evaluation Note ? ?Natasha Long , a 26 y.o. female  was evaluated in triage.  Pt complains of atraumatic severe, constant R knee pain aggravated by movement. States she was putting increased weight on her R leg after twisting her L ankle, but her L ankle currently does not hurt. No medications taken for pain PTA. Denies known fever. ESRD patient; noncompliant.  ? ?Review of Systems  ?Positive: As above ?Negative: As above ? ?Physical Exam  ?BP (!) 193/99 (BP Location: Right Arm)   Pulse 95   Temp 99.1 ?F (37.3 ?C) (Oral)   Resp (!) 28   Ht 5\' 8"  (1.727 m)   Wt 108.9 kg   LMP 01/28/2022   SpO2 100%   BMI 36.49 kg/m?  ?Gen:   Awake, intermittently screaming due to pain ?Resp:  Normal effort  ?MSK:   Moves extremities without difficulty  ?Other:  DP pulse 2+ in the RLE. R knee is swollen and warm to touch compared to the right.  ? ?Medical Decision Making  ?Medically screening exam initiated at 11:22 PM.  Appropriate orders placed.  Natasha Long was informed that the remainder of the evaluation will be completed by another provider, this initial triage assessment does not replace that evaluation, and the importance of remaining in the ED until their evaluation is complete. ? ?Atraumatic R knee pain with increased swelling, warmth to touch - pending xray, labs, inflammatory markers. ?  ?Antonietta Breach, PA-C ?01/28/22 2325 ? ?

## 2022-01-28 NOTE — ED Triage Notes (Signed)
Arrives EMS with nontraumatic right knee pain. Dialysis pt with no tx x 2.5 week because it makes her feel "not good".Noncompliant with all medications.  ?

## 2022-01-29 ENCOUNTER — Emergency Department (HOSPITAL_COMMUNITY): Payer: Medicaid Other

## 2022-01-29 DIAGNOSIS — D649 Anemia, unspecified: Secondary | ICD-10-CM | POA: Diagnosis not present

## 2022-01-29 DIAGNOSIS — M0029 Other streptococcal polyarthritis: Secondary | ICD-10-CM | POA: Diagnosis not present

## 2022-01-29 DIAGNOSIS — M79669 Pain in unspecified lower leg: Secondary | ICD-10-CM | POA: Diagnosis present

## 2022-01-29 DIAGNOSIS — S83281A Other tear of lateral meniscus, current injury, right knee, initial encounter: Secondary | ICD-10-CM | POA: Diagnosis not present

## 2022-01-29 DIAGNOSIS — A5901 Trichomonal vulvovaginitis: Secondary | ICD-10-CM | POA: Diagnosis present

## 2022-01-29 DIAGNOSIS — Z5900 Homelessness unspecified: Secondary | ICD-10-CM | POA: Diagnosis not present

## 2022-01-29 DIAGNOSIS — E876 Hypokalemia: Secondary | ICD-10-CM | POA: Diagnosis not present

## 2022-01-29 DIAGNOSIS — D62 Acute posthemorrhagic anemia: Secondary | ICD-10-CM | POA: Diagnosis present

## 2022-01-29 DIAGNOSIS — D631 Anemia in chronic kidney disease: Secondary | ICD-10-CM | POA: Diagnosis present

## 2022-01-29 DIAGNOSIS — Z9115 Patient's noncompliance with renal dialysis: Secondary | ICD-10-CM | POA: Diagnosis not present

## 2022-01-29 DIAGNOSIS — I10 Essential (primary) hypertension: Secondary | ICD-10-CM | POA: Diagnosis not present

## 2022-01-29 DIAGNOSIS — F411 Generalized anxiety disorder: Secondary | ICD-10-CM | POA: Diagnosis present

## 2022-01-29 DIAGNOSIS — M25561 Pain in right knee: Secondary | ICD-10-CM

## 2022-01-29 DIAGNOSIS — E872 Acidosis, unspecified: Secondary | ICD-10-CM | POA: Diagnosis present

## 2022-01-29 DIAGNOSIS — E669 Obesity, unspecified: Secondary | ICD-10-CM | POA: Diagnosis present

## 2022-01-29 DIAGNOSIS — F319 Bipolar disorder, unspecified: Secondary | ICD-10-CM | POA: Diagnosis not present

## 2022-01-29 DIAGNOSIS — F418 Other specified anxiety disorders: Secondary | ICD-10-CM | POA: Diagnosis not present

## 2022-01-29 DIAGNOSIS — M00232 Other streptococcal arthritis, left wrist: Secondary | ICD-10-CM | POA: Diagnosis present

## 2022-01-29 DIAGNOSIS — F17210 Nicotine dependence, cigarettes, uncomplicated: Secondary | ICD-10-CM | POA: Diagnosis present

## 2022-01-29 DIAGNOSIS — N2581 Secondary hyperparathyroidism of renal origin: Secondary | ICD-10-CM | POA: Diagnosis present

## 2022-01-29 DIAGNOSIS — Z992 Dependence on renal dialysis: Secondary | ICD-10-CM

## 2022-01-29 DIAGNOSIS — M25461 Effusion, right knee: Secondary | ICD-10-CM

## 2022-01-29 DIAGNOSIS — B954 Other streptococcus as the cause of diseases classified elsewhere: Secondary | ICD-10-CM | POA: Diagnosis present

## 2022-01-29 DIAGNOSIS — Z79899 Other long term (current) drug therapy: Secondary | ICD-10-CM | POA: Diagnosis not present

## 2022-01-29 DIAGNOSIS — M009 Pyogenic arthritis, unspecified: Secondary | ICD-10-CM | POA: Diagnosis not present

## 2022-01-29 DIAGNOSIS — N186 End stage renal disease: Secondary | ICD-10-CM | POA: Diagnosis present

## 2022-01-29 DIAGNOSIS — I132 Hypertensive heart and chronic kidney disease with heart failure and with stage 5 chronic kidney disease, or end stage renal disease: Secondary | ICD-10-CM | POA: Diagnosis present

## 2022-01-29 DIAGNOSIS — M25532 Pain in left wrist: Secondary | ICD-10-CM | POA: Diagnosis not present

## 2022-01-29 DIAGNOSIS — M1711 Unilateral primary osteoarthritis, right knee: Secondary | ICD-10-CM | POA: Diagnosis present

## 2022-01-29 DIAGNOSIS — F4310 Post-traumatic stress disorder, unspecified: Secondary | ICD-10-CM | POA: Diagnosis present

## 2022-01-29 DIAGNOSIS — F603 Borderline personality disorder: Secondary | ICD-10-CM | POA: Diagnosis present

## 2022-01-29 DIAGNOSIS — F329 Major depressive disorder, single episode, unspecified: Secondary | ICD-10-CM | POA: Diagnosis present

## 2022-01-29 DIAGNOSIS — A539 Syphilis, unspecified: Secondary | ICD-10-CM | POA: Diagnosis not present

## 2022-01-29 DIAGNOSIS — Z20822 Contact with and (suspected) exposure to covid-19: Secondary | ICD-10-CM | POA: Diagnosis present

## 2022-01-29 DIAGNOSIS — R52 Pain, unspecified: Secondary | ICD-10-CM | POA: Diagnosis not present

## 2022-01-29 DIAGNOSIS — L089 Local infection of the skin and subcutaneous tissue, unspecified: Secondary | ICD-10-CM | POA: Diagnosis not present

## 2022-01-29 DIAGNOSIS — Z91199 Patient's noncompliance with other medical treatment and regimen due to unspecified reason: Secondary | ICD-10-CM | POA: Diagnosis not present

## 2022-01-29 DIAGNOSIS — G9340 Encephalopathy, unspecified: Secondary | ICD-10-CM | POA: Diagnosis present

## 2022-01-29 DIAGNOSIS — M25569 Pain in unspecified knee: Secondary | ICD-10-CM | POA: Diagnosis present

## 2022-01-29 DIAGNOSIS — W109XXA Fall (on) (from) unspecified stairs and steps, initial encounter: Secondary | ICD-10-CM | POA: Diagnosis present

## 2022-01-29 DIAGNOSIS — I120 Hypertensive chronic kidney disease with stage 5 chronic kidney disease or end stage renal disease: Secondary | ICD-10-CM | POA: Diagnosis not present

## 2022-01-29 DIAGNOSIS — M0009 Staphylococcal polyarthritis: Secondary | ICD-10-CM | POA: Diagnosis not present

## 2022-01-29 DIAGNOSIS — M00261 Other streptococcal arthritis, right knee: Secondary | ICD-10-CM | POA: Diagnosis present

## 2022-01-29 LAB — RENAL FUNCTION PANEL
Albumin: 2.8 g/dL — ABNORMAL LOW (ref 3.5–5.0)
Anion gap: 22 — ABNORMAL HIGH (ref 5–15)
BUN: 158 mg/dL — ABNORMAL HIGH (ref 6–20)
CO2: 8 mmol/L — ABNORMAL LOW (ref 22–32)
Calcium: 8.2 mg/dL — ABNORMAL LOW (ref 8.9–10.3)
Chloride: 104 mmol/L (ref 98–111)
Creatinine, Ser: 30.45 mg/dL — ABNORMAL HIGH (ref 0.44–1.00)
GFR, Estimated: 1 mL/min — ABNORMAL LOW (ref >60)
Glucose, Bld: 99 mg/dL (ref 70–99)
Phosphorus: 11.9 mg/dL — ABNORMAL HIGH (ref 2.5–4.6)
Potassium: 5.3 mmol/L — ABNORMAL HIGH (ref 3.5–5.1)
Sodium: 134 mmol/L — ABNORMAL LOW (ref 135–145)

## 2022-01-29 LAB — HEPATITIS B SURFACE ANTIGEN: Hepatitis B Surface Ag: NONREACTIVE

## 2022-01-29 LAB — SEDIMENTATION RATE: Sed Rate: 79 mm/hr — ABNORMAL HIGH (ref 0–22)

## 2022-01-29 LAB — CBC WITH DIFFERENTIAL/PLATELET
Abs Immature Granulocytes: 0.06 10*3/uL (ref 0.00–0.07)
Basophils Absolute: 0 10*3/uL (ref 0.0–0.1)
Basophils Relative: 0 %
Eosinophils Absolute: 0 10*3/uL (ref 0.0–0.5)
Eosinophils Relative: 0 %
HCT: 22.6 % — ABNORMAL LOW (ref 36.0–46.0)
Hemoglobin: 7.4 g/dL — ABNORMAL LOW (ref 12.0–15.0)
Immature Granulocytes: 1 %
Lymphocytes Relative: 6 %
Lymphs Abs: 0.7 10*3/uL (ref 0.7–4.0)
MCH: 30.2 pg (ref 26.0–34.0)
MCHC: 32.7 g/dL (ref 30.0–36.0)
MCV: 92.2 fL (ref 80.0–100.0)
Monocytes Absolute: 0.6 10*3/uL (ref 0.1–1.0)
Monocytes Relative: 5 %
Neutro Abs: 11.9 10*3/uL — ABNORMAL HIGH (ref 1.7–7.7)
Neutrophils Relative %: 88 %
Platelets: 174 10*3/uL (ref 150–400)
RBC: 2.45 MIL/uL — ABNORMAL LOW (ref 3.87–5.11)
RDW: 15.2 % (ref 11.5–15.5)
WBC: 13.3 10*3/uL — ABNORMAL HIGH (ref 4.0–10.5)
nRBC: 0 % (ref 0.0–0.2)

## 2022-01-29 LAB — BASIC METABOLIC PANEL
Anion gap: 22 — ABNORMAL HIGH (ref 5–15)
BUN: 155 mg/dL — ABNORMAL HIGH (ref 6–20)
CO2: 10 mmol/L — ABNORMAL LOW (ref 22–32)
Calcium: 8.5 mg/dL — ABNORMAL LOW (ref 8.9–10.3)
Chloride: 104 mmol/L (ref 98–111)
Creatinine, Ser: 31.38 mg/dL — ABNORMAL HIGH (ref 0.44–1.00)
GFR, Estimated: 1 mL/min — ABNORMAL LOW (ref >60)
Glucose, Bld: 107 mg/dL — ABNORMAL HIGH (ref 70–99)
Potassium: 5.1 mmol/L (ref 3.5–5.1)
Sodium: 136 mmol/L (ref 135–145)

## 2022-01-29 LAB — CBC
HCT: 22.6 % — ABNORMAL LOW (ref 36.0–46.0)
Hemoglobin: 7.6 g/dL — ABNORMAL LOW (ref 12.0–15.0)
MCH: 30.8 pg (ref 26.0–34.0)
MCHC: 33.6 g/dL (ref 30.0–36.0)
MCV: 91.5 fL (ref 80.0–100.0)
Platelets: 155 10*3/uL (ref 150–400)
RBC: 2.47 MIL/uL — ABNORMAL LOW (ref 3.87–5.11)
RDW: 15.1 % (ref 11.5–15.5)
WBC: 15.7 10*3/uL — ABNORMAL HIGH (ref 4.0–10.5)
nRBC: 0 % (ref 0.0–0.2)

## 2022-01-29 LAB — I-STAT BETA HCG BLOOD, ED (MC, WL, AP ONLY): I-stat hCG, quantitative: <5 m[IU]/mL (ref <5)

## 2022-01-29 LAB — HEPATITIS B SURFACE ANTIBODY,QUALITATIVE: Hep B S Ab: REACTIVE — AB

## 2022-01-29 LAB — C-REACTIVE PROTEIN: CRP: 8.5 mg/dL — ABNORMAL HIGH (ref <1.0)

## 2022-01-29 MED ORDER — HEPARIN SODIUM (PORCINE) 1000 UNIT/ML DIALYSIS
1000.0000 [IU] | INTRAMUSCULAR | Status: DC | PRN
  Filled 2022-01-29: qty 1

## 2022-01-29 MED ORDER — LOSARTAN POTASSIUM 50 MG PO TABS
50.0000 mg | ORAL_TABLET | Freq: Every day | ORAL | Status: DC
  Administered 2022-01-29 – 2022-02-16 (×17): 50 mg via ORAL
  Filled 2022-01-29 (×17): qty 1

## 2022-01-29 MED ORDER — OXYCODONE-ACETAMINOPHEN 5-325 MG PO TABS
1.0000 | ORAL_TABLET | Freq: Once | ORAL | Status: AC
  Administered 2022-01-29: 1 via ORAL
  Filled 2022-01-29: qty 1

## 2022-01-29 MED ORDER — LIDOCAINE HCL 2 % IJ SOLN
10.0000 mL | Freq: Once | INTRAMUSCULAR | Status: AC
  Administered 2022-01-29: 200 mg
  Filled 2022-01-29: qty 20

## 2022-01-29 MED ORDER — HYDROXYZINE HCL 25 MG PO TABS
25.0000 mg | ORAL_TABLET | Freq: Three times a day (TID) | ORAL | Status: DC | PRN
  Administered 2022-01-29 – 2022-02-13 (×17): 25 mg via ORAL
  Filled 2022-01-29 (×18): qty 1

## 2022-01-29 MED ORDER — LIDOCAINE-PRILOCAINE 2.5-2.5 % EX CREA
1.0000 "application " | TOPICAL_CREAM | CUTANEOUS | Status: DC | PRN
  Filled 2022-01-29: qty 5

## 2022-01-29 MED ORDER — CHLORHEXIDINE GLUCONATE CLOTH 2 % EX PADS
6.0000 | MEDICATED_PAD | Freq: Every day | CUTANEOUS | Status: DC
  Administered 2022-01-30 – 2022-02-16 (×12): 6 via TOPICAL

## 2022-01-29 MED ORDER — PANTOPRAZOLE SODIUM 40 MG PO TBEC
40.0000 mg | DELAYED_RELEASE_TABLET | Freq: Every day | ORAL | Status: DC
  Administered 2022-01-29 – 2022-02-16 (×18): 40 mg via ORAL
  Filled 2022-01-29 (×18): qty 1

## 2022-01-29 MED ORDER — LORAZEPAM 2 MG/ML IJ SOLN
2.0000 mg | Freq: Once | INTRAMUSCULAR | Status: AC
  Administered 2022-01-29: 2 mg via INTRAMUSCULAR
  Filled 2022-01-29: qty 1

## 2022-01-29 MED ORDER — ARIPIPRAZOLE 5 MG PO TABS
7.5000 mg | ORAL_TABLET | Freq: Every day | ORAL | Status: DC
  Administered 2022-01-29 – 2022-02-16 (×18): 7.5 mg via ORAL
  Filled 2022-01-29 (×19): qty 2

## 2022-01-29 MED ORDER — ACETAMINOPHEN 500 MG PO TABS
1000.0000 mg | ORAL_TABLET | Freq: Three times a day (TID) | ORAL | Status: DC
  Administered 2022-01-29 – 2022-02-15 (×46): 1000 mg via ORAL
  Filled 2022-01-29 (×48): qty 2

## 2022-01-29 MED ORDER — ENOXAPARIN SODIUM 40 MG/0.4ML IJ SOSY: 40.0000 mg | PREFILLED_SYRINGE | INTRAMUSCULAR | Status: DC

## 2022-01-29 MED ORDER — PENTAFLUOROPROP-TETRAFLUOROETH EX AERO
1.0000 "application " | INHALATION_SPRAY | CUTANEOUS | Status: DC | PRN
  Filled 2022-01-29: qty 116

## 2022-01-29 MED ORDER — KETOROLAC TROMETHAMINE 30 MG/ML IJ SOLN
30.0000 mg | Freq: Once | INTRAMUSCULAR | Status: AC
  Administered 2022-01-29: 30 mg via INTRAMUSCULAR
  Filled 2022-01-29: qty 1

## 2022-01-29 MED ORDER — HYDROMORPHONE HCL 1 MG/ML IJ SOLN
0.5000 mg | Freq: Once | INTRAMUSCULAR | Status: AC
  Administered 2022-01-29: 0.5 mg via INTRAVENOUS
  Filled 2022-01-29: qty 1

## 2022-01-29 MED ORDER — LIDOCAINE HCL (PF) 1 % IJ SOLN: 5.0000 mL | INTRAMUSCULAR | Status: DC | PRN

## 2022-01-29 MED ORDER — SODIUM CHLORIDE 0.9 % IV SOLN: 100.0000 mL | INTRAVENOUS | Status: DC | PRN

## 2022-01-29 NOTE — ED Notes (Signed)
EDP at [facility] with admitting team, pt sleeping.  ?

## 2022-01-29 NOTE — ED Notes (Signed)
Back from CT, alert, NAD, calm.  

## 2022-01-29 NOTE — ED Provider Notes (Signed)
[facility] EMERGENCY DEPARTMENT Provider Note   CSN: [id number] Arrival date & time: 01/28/22  2239     History  Chief Complaint  Patient presents with   Knee Pain    Natasha Long is a 26 y.o. female.  HPI Patient presents for pain in right knee which occurred after she twisted it when she stumbled, after twisting her left ankle, yesterday.  She had a prolonged ED waiting room study, to wait for an open bed.  I saw her shortly after she arrived in the treatment area.  She was crying out for pain medicine, persistently.  She does not know her prior problem with her right knee.    Home Medications Prior to Admission medications   Medication Sig Start Date End Date Taking? Authorizing Provider  acetaminophen (TYLENOL) 650 MG CR tablet Take 650-1,300 mg by mouth every 8 (eight) hours as needed for pain.    [provider]  ARIPiprazole (ABILIFY) 15 MG tablet Take 0.5 tablets (7.5 mg total) by mouth daily. 12/27/21   Regalado, Belkys A, MD  B Complex-C-Folic Acid (RENAL VITAMIN) 0.8 MG TABS Take 1 tablet by mouth daily. 10/29/21   [provider]  Blood Pressure Monitoring (BLOOD PRESSURE MONITOR 7) DEVI 1 Units by Does not apply route daily. Measure blood pressure daily 02/29/20   [name], MD  calcitRIOL (ROCALTROL) 0.25 MCG capsule Take 0.25 mcg by mouth See admin instructions. 0.19mcg daily on dialysis days - Tuesday's,Thursday's, and Saturday's 09/25/20   [provider]  calcium acetate (PHOSLO) 667 MG capsule Take 667 mg by mouth 3 (three) times daily. 07/03/21   [provider]  gabapentin (NEURONTIN) 600 MG tablet Take 0.5 tablets (300 mg total) by mouth 3 (three) times daily. 10/31/21   [name], MD  hydrOXYzine (VISTARIL) 25 MG capsule Take 1 capsule (25 mg total) by mouth every 8 (eight) hours as needed. Patient taking differently: Take 25 mg by mouth every 8 (eight) hours as needed for anxiety. 08/11/21    [name], MD  lidocaine-prilocaine (EMLA) cream Apply 1 application topically daily as needed (port site access on dialysis days (Tuesday's, Thursday's, and Saturday's). 03/06/21   [provider]  losartan (COZAAR) 50 MG tablet Take 50 mg by mouth daily. 12/11/21   [provider]  omeprazole (PRILOSEC) 20 MG capsule Take 1 capsule (20 mg total) by mouth daily. 08/11/21   [name], MD  ondansetron (ZOFRAN ODT) 4 MG disintegrating tablet Take 1 tablet (4 mg total) by mouth every 8 (eight) hours as needed for nausea or vomiting. 03/18/21   Long, Wonda Olds, MD      Allergies    Prozac [fluoxetine hcl], Wellbutrin [bupropion], and Prednisone    Review of Systems   Review of Systems  Physical Exam Updated Vital Signs BP (!) 143/75    Pulse (!) 102    Temp 99.1 F (37.3 C) (Oral)    Resp (!) 24    Ht 5\' 8"  (1.727 m)    Wt 108.9 kg    LMP 01/28/2022    SpO2 94%    BMI 36.49 kg/m  Physical Exam Vitals and nursing note reviewed.  Constitutional:      General: She is in acute distress.     Appearance: She is well-developed. She is not ill-appearing or toxic-appearing.  HENT:     Head: Normocephalic and atraumatic.     Right Ear: External ear normal.     Left  Ear: External ear normal.  Eyes:     Conjunctiva/sclera: Conjunctivae normal.     Pupils: Pupils are equal, round, and reactive to light.  Neck:     Trachea: Phonation normal.  Cardiovascular:     Rate and Rhythm: Normal rate and regular rhythm.  Pulmonary:     Effort: Pulmonary effort is normal.  Abdominal:     General: There is no distension.  Musculoskeletal:     Cervical back: Normal range of motion and neck supple.     Comments: She guards against movement of the right knee secondary to pain, holding it flexed at about 20 degrees with a pillow behind her knee.  The right knee appears mildly swollen.  It is exquisitely tender to light touch  Skin:    General: Skin is warm and dry.  Neurological:      Mental Status: She is alert and oriented to person, place, and time.     Cranial Nerves: No cranial nerve deficit.     Sensory: No sensory deficit.     Motor: No abnormal muscle tone.     Coordination: Coordination normal.  Psychiatric:        Mood and Affect: Mood normal.        Behavior: Behavior normal.        Thought Content: Thought content normal.        Judgment: Judgment normal.    ED Results / Procedures / Treatments   Labs (all labs ordered are listed, but only abnormal results are displayed) Labs Reviewed  SEDIMENTATION RATE - Abnormal; Notable for the following components:      Result Value   Sed Rate 79 (*)    All other components within normal limits  CBC WITH DIFFERENTIAL/PLATELET - Abnormal; Notable for the following components:   WBC 13.3 (*)    RBC 2.45 (*)    Hemoglobin 7.4 (*)    HCT 22.6 (*)    Neutro Abs 11.9 (*)    All other components within normal limits  BASIC METABOLIC PANEL - Abnormal; Notable for the following components:   CO2 10 (*)    Glucose, Bld 107 (*)    BUN 155 (*)    Creatinine, Ser 31.38 (*)    Calcium 8.5 (*)    GFR, Estimated 1 (*)    Anion gap 22 (*)    All other components within normal limits  C-REACTIVE PROTEIN - Abnormal; Notable for the following components:   CRP 8.5 (*)    All other components within normal limits  HEPATITIS B SURFACE ANTIGEN  HEPATITIS B SURFACE ANTIBODY,QUALITATIVE  HEPATITIS B SURFACE ANTIBODY, QUANTITATIVE  CBC  RENAL FUNCTION PANEL  I-STAT BETA HCG BLOOD, ED ([facility], [facility], [facility] ONLY)    EKG None  Radiology CT Knee Right Wo Contrast  Result Date: 01/29/2022 CLINICAL DATA:  Right knee pain status post fall 2 days ago. EXAM: CT OF THE RIGHT KNEE WITHOUT CONTRAST TECHNIQUE: Multidetector CT imaging of the right knee was performed according to the standard protocol. Multiplanar CT image reconstructions were also generated. RADIATION DOSE REDUCTION: This exam was performed according to the departmental  dose-optimization program which includes automated exposure control, adjustment of the mA and/or kV according to patient size and/or use of iterative reconstruction technique. COMPARISON:  None. FINDINGS: Bones/Joint/Cartilage No acute fracture or dislocation. Normal alignment. Large joint effusion. Small loose body along the anterior aspect of the distal ACL insertion. Moderate osteoarthritis of the lateral femorotibial compartment with subchondral cystic changes in  the lateral tibial plateau and marginal osteophytes. Mild osteoarthritis of the medial femorotibial compartment. Large Baker's cyst. Ligaments Ligaments are suboptimally evaluated by CT. Muscles and Tendons Muscles are normal. No muscle atrophy. No intramuscular fluid collection or hematoma. Quadriceps tendon and patellar tendon are intact. Soft tissue No fluid collection or hematoma. No soft tissue mass. Mild soft tissue edema superficial to the medial gastrocnemius muscle likely reflecting a leaking or partially ruptured Baker's cyst. IMPRESSION: 1. No acute fracture or dislocation of the right knee. 2. 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 3. Large joint effusion and large Baker's cyst. Mild soft tissue edema superficial to the medial gastrocnemius muscle likely reflecting a leaking or partially ruptured Baker's cyst. Electronically Signed   By: Kathreen Devoid M.D.   On: 01/29/2022 10:03   DG Knee Complete 4 Views Right  Result Date: 01/29/2022 CLINICAL DATA:  atraumatic knee pain EXAM: RIGHT KNEE - COMPLETE 4+ VIEW COMPARISON:  None. FINDINGS: No evidence of fracture or dislocation. Small joint effusion. Lateral tibiofemoral compartment moderate to severe degenerative changes. No aggressive appearing bone abnormality. Soft tissues are unremarkable. IMPRESSION: 1. No acute displaced fracture or dislocation. 2. Lateral tibiofemoral  compartment moderate to severe degenerative changes. 3. Small joint effusion. Electronically Signed   By: Iven Finn M.D.   On: 01/29/2022 00:07    Procedures Procedures    Medications Ordered in ED Medications  Chlorhexidine Gluconate Cloth 2 % PADS 6 each (has no administration in time range)  pentafluoroprop-tetrafluoroeth (GEBAUERS) aerosol 1 application (has no administration in time range)  lidocaine (PF) (XYLOCAINE) 1 % injection 5 mL (has no administration in time range)  lidocaine-prilocaine (EMLA) cream 1 application (has no administration in time range)  0.9 %  sodium chloride infusion (has no administration in time range)  0.9 %  sodium chloride infusion (has no administration in time range)  heparin injection 1,000 Units (has no administration in time range)  HYDROcodone-acetaminophen (NORCO/VICODIN) 5-325 MG per tablet 1 tablet (1 tablet Oral Given 01/28/22 2325)  ketorolac (TORADOL) 30 MG/ML injection 30 mg (30 mg Intramuscular Given 01/29/22 0926)  oxyCODONE-acetaminophen (PERCOCET/ROXICET) 5-325 MG per tablet 1 tablet (1 tablet Oral Given 01/29/22 0926)  LORazepam (ATIVAN) injection 2 mg (2 mg Intramuscular Given 01/29/22 0926)  lidocaine (XYLOCAINE) 2 % (with pres) injection 200 mg (200 mg Other Given by Other 01/29/22 1101)    ED Course/ Medical Decision Making/ A&P Clinical Course as of 01/29/22 1210  Thu Jan 29, 2022  1026 I was able to reach nephrologist, Dr. Hollie Salk, who states that the patient has not showed up to dialysis for 2 weeks and that she has been missing when I tried to contact her.  She states that the patient needs urgent dialysis and will put in orders for that to be done here in the hospital.  Patient will need to be admitted [EW]  1207 Patient remains sleepy and does not wake up to be able to give consent for right knee aspiration to be evaluated for joint infection versus inflammatory arthritis.  She has been called to dialysis for treatment which she needs.   This takes precedence.  I anticipate that she will return to the ED, at which time she can be consented for knee aspiration, and then receive that procedure. [EW]    Clinical Course User Index [EW] Daleen Bo, MD  Medical Decision Making She presents for acute right knee pain after twisting it, when she stumbled, yesterday.  She does not recall having arthritis previously.  She has been unable to bear weight on it since that time.  Problems Addressed: Dialysis patient, noncompliant [facility]): acute illness or injury    Details: She has not been dialyzed in over 2 weeks, because of failure to present for treatment. Effusion of right knee: acute illness or injury    Details: Presents today after a twisting injury yesterday.  Duration not entirely clear.  Patient will require knee aspiration to evaluate for infection versus inflammation, after dialysis. End stage renal disease [facility]): chronic illness or injury    Details: Periods of noncompliance noted in history. Osteoarthritis of right knee, unspecified osteoarthritis type: chronic illness or injury    Details: Exacerbated by twisting injury yesterday.  Amount and/or Complexity of Data Reviewed Labs: ordered. Radiology: ordered and independent interpretation performed.    Details: Right knee x-ray, CT right knee-degenerative joint present, knee effusion present, no fracture present.  Risk Prescription drug management. Decision regarding hospitalization. Risk Details: Patient has been noncompliant, presenting with very high creatinine.  She will require repeated dialysis therefore need to be admitted.  She has a knee injury with effusion, that will require orthopedic consultation and knee aspiration, prior to admission to the hospital.  Patient sent for urgent dialysis.  She was unable to consent for knee aspiration prior to going to dialysis.  Critical Care Total time providing critical care: 30-74  minutes          Final Clinical Impression(s) / ED Diagnoses Final diagnoses:  End stage renal disease ([place])  Dialysis patient, noncompliant ([place])  Effusion of right knee  Osteoarthritis of right knee, unspecified osteoarthritis type    Rx / DC Orders ED Discharge Orders     None         Daleen Bo, MD 01/30/22 1019

## 2022-01-29 NOTE — ED Notes (Signed)
EDP at [facility] 

## 2022-01-29 NOTE — Hospital Course (Addendum)
States that she was not able to sleep well overnight due to alarms. Discussed plan for transitioning off PCA dilaudid to oral dilaudid. Patient very frustrated with this.

## 2022-01-29 NOTE — ED Notes (Signed)
Pt alert, NAD, calm, interactive, resps e/u, speaking in clear complete sentences. C/o R knee pain, worst pain ever. Denies other sx. Fell 2 days ago, and states put all weight on R leg torquing R knee. Labs and imaging results from triage protocol reviewed. CMS intact. Limited ROM d/t pain.  ?

## 2022-01-29 NOTE — ED Notes (Signed)
Lab at BS 
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
## 2022-01-29 NOTE — ED Notes (Signed)
Pts linens changed 

## 2022-01-29 NOTE — ED Notes (Addendum)
Pt remains sedated. Unable to consent for arthrocentesis or HD. EDP, HD, and CN aware. VSS. S.O at [facility].  Will continue to monitor.  ?

## 2022-01-29 NOTE — ED Notes (Signed)
Patient given ice pack for pain. Triage Rn notified that patient wants pain meds ?

## 2022-01-29 NOTE — Procedures (Signed)
Patient with a significantly challenging social history with barriers to adherence who presents once again to the emergency room today with complaints of right knee pain and feeling poorly. She has not been to her scheduled out-patient hemodialysis treatments since her last treatment on 01/08/2022 when she ran 2:18 of her prescribed time of 3:30. Her labs today were significant for BUN 155, Creatinine 31.38, K 5.1 and CO2 10. ?She was somnolent when seen on Hemodialysis after getting some sedation/pain medications in the ER earlier. ? ?BP (!) 160/83   Pulse 98   Temp 99.1 ?F (37.3 ?C) (Oral)   Resp 18   Ht 5\' 8"  (1.727 m)   Wt 108.9 kg   LMP 01/28/2022   SpO2 100%   BMI 36.49 kg/m?   ? ?QB 250, UF goal 1L - getting low BFR/DFR and UF to limit risk of DDS.  ?Tolerating treatment with apparent problems at this time. ? ? ?Elmarie Shiley MD ?[facility] ?Office # [phone] ?Pager # [phone] ?2:13 PM ? ?

## 2022-01-29 NOTE — ED Notes (Addendum)
Admitting MD team into room, at [facility]. Team updated. Pt sleeping.  ?

## 2022-01-29 NOTE — ED Provider Notes (Signed)
5:24 PM ?Care assumed from Dr. Eulis Foster.  At time of transfer care, patient is waiting to return from dialysis for reassessment.  She reportedly was having knee pain after a traumatic injury but then had need for emergent dialysis.  According to previous team, patient returns, we need to reassess her, determine if she needs the aspiration or not, and then likely admit for repeat hemodialysis. ? ? ?5:30 PM ?On return to the ED, patient is still somnolent and not answering questions fully.  As with previous team, I do not feel comfortable obtaining informed consent for a knee aspiration on her.  She does not appear to be in pain initially and is somnolent and resting. ? ?Due to her continued altered mental status, will speak to nephrology and then call medicine for admission. ? ?5:37 PM ?Spoke to nephrology with Dr. Posey Pronto who says he agrees she needs admission for further dialysis and management of her altered mental status. ? ? ?  ?Keilen Kahl, Gwenyth Allegra, MD ?01/30/22 0034 ? ?

## 2022-01-29 NOTE — ED Notes (Signed)
Attempted consent. Pt remains still too sedated to consent. Arousable to pain. Opened eyes and signed consent. S.O. at [facility] Pt will return to ED for arthrocentesis. ?

## 2022-01-29 NOTE — ED Notes (Signed)
Pt to CT

## 2022-01-29 NOTE — ED Notes (Signed)
EDP into room. Pt sleeping. Pt to go to HD, then return to ED for continuation of ED work-up for R knee pain. Pt to have R knee arthocentesis upon return to ED from HD.    ?

## 2022-01-30 ENCOUNTER — Inpatient Hospital Stay (HOSPITAL_COMMUNITY): Payer: Medicaid Other | Admitting: Anesthesiology

## 2022-01-30 ENCOUNTER — Encounter (HOSPITAL_COMMUNITY)
Admission: EM | Disposition: A | Discharge status: Skilled Nursing Facility | Payer: Self-pay | Source: Home / Self Care | Attending: Internal Medicine

## 2022-01-30 ENCOUNTER — Other Ambulatory Visit: Payer: Self-pay

## 2022-01-30 ENCOUNTER — Inpatient Hospital Stay (HOSPITAL_COMMUNITY): Payer: Medicaid Other | Admitting: Certified Registered Nurse Anesthetist

## 2022-01-30 ENCOUNTER — Encounter (HOSPITAL_COMMUNITY): Payer: Self-pay | Admitting: Internal Medicine

## 2022-01-30 ENCOUNTER — Inpatient Hospital Stay (HOSPITAL_COMMUNITY): Payer: Medicaid Other

## 2022-01-30 DIAGNOSIS — F319 Bipolar disorder, unspecified: Secondary | ICD-10-CM

## 2022-01-30 DIAGNOSIS — D649 Anemia, unspecified: Secondary | ICD-10-CM

## 2022-01-30 DIAGNOSIS — M25461 Effusion, right knee: Secondary | ICD-10-CM

## 2022-01-30 DIAGNOSIS — Z9115 Patient's noncompliance with renal dialysis: Secondary | ICD-10-CM

## 2022-01-30 DIAGNOSIS — S83281A Other tear of lateral meniscus, current injury, right knee, initial encounter: Secondary | ICD-10-CM | POA: Diagnosis not present

## 2022-01-30 DIAGNOSIS — E875 Hyperkalemia: Secondary | ICD-10-CM | POA: Diagnosis present

## 2022-01-30 DIAGNOSIS — M009 Pyogenic arthritis, unspecified: Secondary | ICD-10-CM | POA: Diagnosis not present

## 2022-01-30 DIAGNOSIS — F418 Other specified anxiety disorders: Secondary | ICD-10-CM | POA: Diagnosis not present

## 2022-01-30 DIAGNOSIS — M25561 Pain in right knee: Secondary | ICD-10-CM | POA: Diagnosis not present

## 2022-01-30 DIAGNOSIS — I10 Essential (primary) hypertension: Secondary | ICD-10-CM

## 2022-01-30 DIAGNOSIS — L089 Local infection of the skin and subcutaneous tissue, unspecified: Secondary | ICD-10-CM

## 2022-01-30 HISTORY — PX: I & D EXTREMITY: SHX5045

## 2022-01-30 HISTORY — PX: INCISION AND DRAINAGE OF WOUND: SHX1803

## 2022-01-30 HISTORY — PX: KNEE ARTHROSCOPY: SHX127

## 2022-01-30 LAB — SYNOVIAL CELL COUNT + DIFF, W/ CRYSTALS
Crystals, Fluid: NONE SEEN
Eosinophils-Synovial: 0 % (ref 0–1)
Lymphocytes-Synovial Fld: 0 % (ref 0–20)
Monocyte-Macrophage-Synovial Fluid: 2 % — ABNORMAL LOW (ref 50–90)
Neutrophil, Synovial: 98 % — ABNORMAL HIGH (ref 0–25)
WBC, Synovial: 52500 /mm3 — ABNORMAL HIGH (ref 0–200)

## 2022-01-30 LAB — SYNOVIAL FLUID, CRYSTAL: Crystals, Fluid: NONE SEEN

## 2022-01-30 LAB — RENAL FUNCTION PANEL
Albumin: 2.4 g/dL — ABNORMAL LOW (ref 3.5–5.0)
Anion gap: 15 (ref 5–15)
BUN: 104 mg/dL — ABNORMAL HIGH (ref 6–20)
CO2: 18 mmol/L — ABNORMAL LOW (ref 22–32)
Calcium: 8.3 mg/dL — ABNORMAL LOW (ref 8.9–10.3)
Chloride: 100 mmol/L (ref 98–111)
Creatinine, Ser: 20.31 mg/dL — ABNORMAL HIGH (ref 0.44–1.00)
GFR, Estimated: 2 mL/min — ABNORMAL LOW (ref >60)
Glucose, Bld: 127 mg/dL — ABNORMAL HIGH (ref 70–99)
Phosphorus: 8.4 mg/dL — ABNORMAL HIGH (ref 2.5–4.6)
Potassium: 3.8 mmol/L (ref 3.5–5.1)
Sodium: 133 mmol/L — ABNORMAL LOW (ref 135–145)

## 2022-01-30 LAB — HEPATITIS B SURFACE ANTIBODY, QUANTITATIVE: Hep B S AB Quant (Post): >1000 m[IU]/mL (ref >9.9)

## 2022-01-30 LAB — CBC
HCT: 21.5 % — ABNORMAL LOW (ref 36.0–46.0)
Hemoglobin: 7.4 g/dL — ABNORMAL LOW (ref 12.0–15.0)
MCH: 30.2 pg (ref 26.0–34.0)
MCHC: 34.4 g/dL (ref 30.0–36.0)
MCV: 87.8 fL (ref 80.0–100.0)
Platelets: 145 10*3/uL — ABNORMAL LOW (ref 150–400)
RBC: 2.45 MIL/uL — ABNORMAL LOW (ref 3.87–5.11)
RDW: 15 % (ref 11.5–15.5)
WBC: 13.7 10*3/uL — ABNORMAL HIGH (ref 4.0–10.5)
nRBC: 0 % (ref 0.0–0.2)

## 2022-01-30 LAB — PROTIME-INR
INR: 1.4 — ABNORMAL HIGH (ref 0.8–1.2)
Prothrombin Time: 17 seconds — ABNORMAL HIGH (ref 11.4–15.2)

## 2022-01-30 LAB — APTT: aPTT: 39 seconds — ABNORMAL HIGH (ref 24–36)

## 2022-01-30 LAB — RESP PANEL BY RT-PCR (FLU A&B, COVID) ARPGX2
Influenza A by PCR: NEGATIVE
Influenza B by PCR: NEGATIVE
SARS Coronavirus 2 by RT PCR: NEGATIVE

## 2022-01-30 SURGERY — ARTHROSCOPY, KNEE
Anesthesia: General | Site: Wrist | Laterality: Right

## 2022-01-30 SURGERY — IRRIGATION AND DEBRIDEMENT EXTREMITY
Anesthesia: General | Laterality: Left

## 2022-01-30 MED ORDER — NEPRO/CARBSTEADY PO LIQD
237.0000 mL | Freq: Two times a day (BID) | ORAL | Status: DC
  Administered 2022-02-01 – 2022-02-03 (×5): 237 mL via ORAL

## 2022-01-30 MED ORDER — PROPOFOL 10 MG/ML IV BOLUS
INTRAVENOUS | Status: DC | PRN
  Administered 2022-01-30: 200 mg via INTRAVENOUS

## 2022-01-30 MED ORDER — FENTANYL CITRATE (PF) 100 MCG/2ML IJ SOLN
25.0000 ug | INTRAMUSCULAR | Status: DC | PRN
  Administered 2022-01-30 (×2): 50 ug via INTRAVENOUS

## 2022-01-30 MED ORDER — ACETAMINOPHEN 10 MG/ML IV SOLN
INTRAVENOUS | Status: AC
  Filled 2022-01-30: qty 100

## 2022-01-30 MED ORDER — ONDANSETRON HCL 4 MG/2ML IJ SOLN
INTRAMUSCULAR | Status: AC
  Filled 2022-01-30: qty 2

## 2022-01-30 MED ORDER — ROCURONIUM BROMIDE 10 MG/ML (PF) SYRINGE
PREFILLED_SYRINGE | INTRAVENOUS | Status: DC | PRN
  Administered 2022-01-30: 40 mg via INTRAVENOUS

## 2022-01-30 MED ORDER — FENTANYL CITRATE (PF) 250 MCG/5ML IJ SOLN
INTRAMUSCULAR | Status: DC | PRN
  Administered 2022-01-30 (×3): 50 ug via INTRAVENOUS
  Administered 2022-01-30: 100 ug via INTRAVENOUS

## 2022-01-30 MED ORDER — HYDROMORPHONE HCL 1 MG/ML IJ SOLN
INTRAMUSCULAR | Status: AC
  Administered 2022-01-30: 0.5 mg via INTRAVENOUS
  Filled 2022-01-30: qty 2

## 2022-01-30 MED ORDER — CHLORHEXIDINE GLUCONATE 0.12 % MT SOLN
OROMUCOSAL | Status: AC
  Filled 2022-01-30: qty 15

## 2022-01-30 MED ORDER — VANCOMYCIN HCL 2000 MG/400ML IV SOLN
2000.0000 mg | Freq: Once | INTRAVENOUS | Status: AC
  Administered 2022-01-30: 2000 mg via INTRAVENOUS
  Filled 2022-01-30: qty 400

## 2022-01-30 MED ORDER — AMISULPRIDE (ANTIEMETIC) 5 MG/2ML IV SOLN: 10.0000 mg | Freq: Once | INTRAVENOUS | Status: DC | PRN

## 2022-01-30 MED ORDER — POVIDONE-IODINE 10 % EX SWAB: 2.0000 "application " | Freq: Once | CUTANEOUS | Status: DC

## 2022-01-30 MED ORDER — SUCCINYLCHOLINE CHLORIDE 200 MG/10ML IV SOSY
PREFILLED_SYRINGE | INTRAVENOUS | Status: DC | PRN
  Administered 2022-01-30: 80 mg via INTRAVENOUS
  Administered 2022-01-30: 120 mg via INTRAVENOUS

## 2022-01-30 MED ORDER — ACETAMINOPHEN 500 MG PO TABS
1000.0000 mg | ORAL_TABLET | Freq: Once | ORAL | Status: DC
  Filled 2022-01-30: qty 2

## 2022-01-30 MED ORDER — CEFAZOLIN SODIUM-DEXTROSE 2-4 GM/100ML-% IV SOLN
2.0000 g | INTRAVENOUS | Status: DC
  Filled 2022-01-30: qty 100

## 2022-01-30 MED ORDER — DEXAMETHASONE SODIUM PHOSPHATE 10 MG/ML IJ SOLN
INTRAMUSCULAR | Status: DC | PRN
  Administered 2022-01-30: 4 mg via INTRAVENOUS

## 2022-01-30 MED ORDER — MIDAZOLAM HCL 2 MG/2ML IJ SOLN
INTRAMUSCULAR | Status: AC
  Filled 2022-01-30: qty 2

## 2022-01-30 MED ORDER — LIDOCAINE 2% (20 MG/ML) 5 ML SYRINGE
INTRAMUSCULAR | Status: AC
  Filled 2022-01-30: qty 5

## 2022-01-30 MED ORDER — DEXMEDETOMIDINE (PRECEDEX) IN NS 20 MCG/5ML (4 MCG/ML) IV SYRINGE
PREFILLED_SYRINGE | INTRAVENOUS | Status: DC | PRN
  Administered 2022-01-30 (×2): 8 ug via INTRAVENOUS
  Administered 2022-01-30 (×2): 12 ug via INTRAVENOUS

## 2022-01-30 MED ORDER — PROPOFOL 10 MG/ML IV BOLUS
INTRAVENOUS | Status: AC
  Filled 2022-01-30: qty 20

## 2022-01-30 MED ORDER — HEPARIN SODIUM (PORCINE) 5000 UNIT/ML IJ SOLN
5000.0000 [IU] | Freq: Three times a day (TID) | INTRAMUSCULAR | Status: DC
Start: 2022-01-30 — End: 2022-02-16
  Administered 2022-01-31 – 2022-02-15 (×21): 5000 [IU] via SUBCUTANEOUS
  Filled 2022-01-30 (×35): qty 1

## 2022-01-30 MED ORDER — SODIUM CHLORIDE 0.9 % IR SOLN
Status: DC | PRN
  Administered 2022-01-30: 1000 mL

## 2022-01-30 MED ORDER — VANCOMYCIN HCL IN DEXTROSE 1-5 GM/200ML-% IV SOLN: 1000.0000 mg | INTRAVENOUS | Status: DC

## 2022-01-30 MED ORDER — BUPIVACAINE HCL 0.5 % IJ SOLN
10.0000 mL | Freq: Once | INTRAMUSCULAR | Status: AC
  Administered 2022-01-30: 10 mL
  Filled 2022-01-30: qty 10

## 2022-01-30 MED ORDER — SODIUM CHLORIDE 0.9 % IV SOLN
2.0000 g | Freq: Once | INTRAVENOUS | Status: AC
  Administered 2022-01-30: 2 g via INTRAVENOUS
  Filled 2022-01-30: qty 2

## 2022-01-30 MED ORDER — ONDANSETRON HCL 4 MG/2ML IJ SOLN
INTRAMUSCULAR | Status: DC | PRN
  Administered 2022-01-30: 4 mg via INTRAVENOUS

## 2022-01-30 MED ORDER — DEXAMETHASONE SODIUM PHOSPHATE 10 MG/ML IJ SOLN
INTRAMUSCULAR | Status: AC
  Filled 2022-01-30: qty 1

## 2022-01-30 MED ORDER — ONDANSETRON HCL 4 MG/2ML IJ SOLN: 4.0000 mg | Freq: Once | INTRAMUSCULAR | Status: DC | PRN

## 2022-01-30 MED ORDER — SODIUM CHLORIDE 0.9 % IV SOLN
INTRAVENOUS | Status: DC
Start: 2022-01-30 — End: 2022-02-16

## 2022-01-30 MED ORDER — PROPOFOL 10 MG/ML IV BOLUS
INTRAVENOUS | Status: DC | PRN
  Administered 2022-01-30: 150 mg via INTRAVENOUS

## 2022-01-30 MED ORDER — DARBEPOETIN ALFA 100 MCG/0.5ML IJ SOSY
100.0000 ug | PREFILLED_SYRINGE | INTRAMUSCULAR | Status: DC
  Filled 2022-01-30: qty 0.5

## 2022-01-30 MED ORDER — MIDAZOLAM HCL 2 MG/2ML IJ SOLN
INTRAMUSCULAR | Status: DC | PRN
Start: 2022-01-30 — End: 2022-01-30
  Administered 2022-01-30: 2 mg via INTRAVENOUS

## 2022-01-30 MED ORDER — SODIUM CHLORIDE 0.9 % IV SOLN: 2.0000 g | INTRAVENOUS | Status: DC

## 2022-01-30 MED ORDER — OXYCODONE HCL 5 MG PO TABS: 5.0000 mg | ORAL_TABLET | Freq: Once | ORAL | Status: DC | PRN

## 2022-01-30 MED ORDER — RENA-VITE PO TABS
1.0000 | ORAL_TABLET | Freq: Every day | ORAL | Status: DC
  Administered 2022-01-31 – 2022-02-16 (×16): 1 via ORAL
  Filled 2022-01-30 (×16): qty 1

## 2022-01-30 MED ORDER — HYDROMORPHONE HCL 1 MG/ML IJ SOLN
0.2500 mg | INTRAMUSCULAR | Status: DC | PRN
  Administered 2022-01-30: 0.5 mg via INTRAVENOUS

## 2022-01-30 MED ORDER — LIDOCAINE 2% (20 MG/ML) 5 ML SYRINGE
INTRAMUSCULAR | Status: DC | PRN
  Administered 2022-01-30: 60 mg via INTRAVENOUS

## 2022-01-30 MED ORDER — SUGAMMADEX SODIUM 200 MG/2ML IV SOLN
INTRAVENOUS | Status: DC | PRN
  Administered 2022-01-30: 200 mg via INTRAVENOUS

## 2022-01-30 MED ORDER — FENTANYL CITRATE (PF) 250 MCG/5ML IJ SOLN
INTRAMUSCULAR | Status: AC
  Filled 2022-01-30: qty 5

## 2022-01-30 MED ORDER — SODIUM CHLORIDE 0.9 % IR SOLN
Status: DC | PRN
Start: 2022-01-30 — End: 2022-01-30
  Administered 2022-01-30: 3000 mL

## 2022-01-30 MED ORDER — DEXMEDETOMIDINE (PRECEDEX) IN NS 20 MCG/5ML (4 MCG/ML) IV SYRINGE
PREFILLED_SYRINGE | INTRAVENOUS | Status: AC
  Filled 2022-01-30: qty 10

## 2022-01-30 MED ORDER — ROCURONIUM BROMIDE 10 MG/ML (PF) SYRINGE
PREFILLED_SYRINGE | INTRAVENOUS | Status: AC
  Filled 2022-01-30: qty 10

## 2022-01-30 MED ORDER — CHLORHEXIDINE GLUCONATE 0.12 % MT SOLN: 15.0000 mL | OROMUCOSAL | Status: AC

## 2022-01-30 MED ORDER — BUPIVACAINE-EPINEPHRINE 0.5% -1:200000 IJ SOLN
INTRAMUSCULAR | Status: DC | PRN
  Administered 2022-01-30: 10 mL

## 2022-01-30 MED ORDER — CHLORHEXIDINE GLUCONATE 4 % EX LIQD: 60.0000 mL | Freq: Once | CUTANEOUS | Status: DC

## 2022-01-30 MED ORDER — BUPIVACAINE-EPINEPHRINE 0.5% -1:200000 IJ SOLN
INTRAMUSCULAR | Status: AC
  Filled 2022-01-30: qty 1

## 2022-01-30 MED ORDER — OXYCODONE HCL 5 MG/5ML PO SOLN: 5.0000 mg | Freq: Once | ORAL | Status: DC | PRN

## 2022-01-30 MED ORDER — HYDROMORPHONE HCL 1 MG/ML IJ SOLN
0.5000 mg | INTRAMUSCULAR | Status: DC | PRN
  Administered 2022-01-30 – 2022-02-02 (×13): 0.5 mg via INTRAVENOUS
  Filled 2022-01-30 (×11): qty 1
  Filled 2022-01-30: qty 0.5
  Filled 2022-01-30: qty 1

## 2022-01-30 MED ORDER — ACETAMINOPHEN 10 MG/ML IV SOLN
INTRAVENOUS | Status: DC | PRN
  Administered 2022-01-30: 1000 mg via INTRAVENOUS

## 2022-01-30 MED ORDER — LIDOCAINE 2% (20 MG/ML) 5 ML SYRINGE
INTRAMUSCULAR | Status: DC | PRN
Start: 2022-01-30 — End: 2022-01-30
  Administered 2022-01-30: 100 mg via INTRAVENOUS

## 2022-01-30 MED ORDER — FENTANYL CITRATE (PF) 250 MCG/5ML IJ SOLN
INTRAMUSCULAR | Status: AC
Start: 2022-01-30 — End: ?
  Filled 2022-01-30: qty 5

## 2022-01-30 MED ORDER — 0.9 % SODIUM CHLORIDE (POUR BTL) OPTIME
TOPICAL | Status: DC | PRN
  Administered 2022-01-30: 1000 mL

## 2022-01-30 MED ORDER — SUCCINYLCHOLINE CHLORIDE 200 MG/10ML IV SOSY
PREFILLED_SYRINGE | INTRAVENOUS | Status: DC | PRN
  Administered 2022-01-30: 140 mg via INTRAVENOUS

## 2022-01-30 SURGICAL SUPPLY — 55 items
BAG COUNTER SPONGE SURGICOUNT (BAG) ×2 IMPLANT
BANDAGE ESMARK 6X9 LF (GAUZE/BANDAGES/DRESSINGS) IMPLANT
BLADE CLIPPER SURG (BLADE) IMPLANT
BLADE EXCALIBUR 4.0X13 (MISCELLANEOUS) ×3 IMPLANT
BNDG ELASTIC 6X10 VLCR STRL LF (GAUZE/BANDAGES/DRESSINGS) ×1 IMPLANT
BNDG ESMARK 6X9 LF (GAUZE/BANDAGES/DRESSINGS)
CHLORAPREP W/TINT 26 (MISCELLANEOUS) ×4 IMPLANT
COOLER ICEMAN CLASSIC (MISCELLANEOUS) ×2 IMPLANT
COVER SURGICAL LIGHT HANDLE (MISCELLANEOUS) ×3 IMPLANT
CUFF TOURN SGL QUICK 34 (TOURNIQUET CUFF)
CUFF TOURN SGL QUICK 42 (TOURNIQUET CUFF) IMPLANT
CUFF TRNQT CYL 34X4.125X (TOURNIQUET CUFF) IMPLANT
DRAPE ARTHROSCOPY W/POUCH 114 (DRAPES) ×3 IMPLANT
DRAPE U-SHAPE 47X51 STRL (DRAPES) ×3 IMPLANT
DRSG TEGADERM 4X4.75 (GAUZE/BANDAGES/DRESSINGS) ×4 IMPLANT
DW OUTFLOW CASSETTE/TUBE SET (MISCELLANEOUS) ×3 IMPLANT
GAUZE SPONGE 4X4 12PLY STRL (GAUZE/BANDAGES/DRESSINGS) ×1 IMPLANT
GAUZE XEROFORM 1X8 LF (GAUZE/BANDAGES/DRESSINGS) IMPLANT
GLOVE SRG 8 PF TXTR STRL LF DI (GLOVE) ×2 IMPLANT
GLOVE SURG ENC MOIS LTX SZ6 (GLOVE) ×8 IMPLANT
GLOVE SURG LTX SZ8 (GLOVE) ×5 IMPLANT
GLOVE SURG SYN 7.5  E (GLOVE) ×1
GLOVE SURG SYN 7.5 E (GLOVE) ×2 IMPLANT
GLOVE SURG SYN 7.5 PF PI (GLOVE) ×4 IMPLANT
GLOVE SURG UNDER POLY LF SZ6.5 (GLOVE) ×6 IMPLANT
GLOVE SURG UNDER POLY LF SZ8 (GLOVE) ×1
GOWN STRL REUS W/ TWL LRG LVL3 (GOWN DISPOSABLE) ×4 IMPLANT
GOWN STRL REUS W/ TWL XL LVL3 (GOWN DISPOSABLE) ×2 IMPLANT
GOWN STRL REUS W/TWL LRG LVL3 (GOWN DISPOSABLE) ×2
GOWN STRL REUS W/TWL XL LVL3 (GOWN DISPOSABLE) ×1
KIT BASIN OR (CUSTOM PROCEDURE TRAY) ×3 IMPLANT
KIT TURNOVER KIT B (KITS) ×3 IMPLANT
MANIFOLD NEPTUNE II (INSTRUMENTS) ×1 IMPLANT
NDL 18GX1X1/2 (RX/OR ONLY) (NEEDLE) IMPLANT
NDL HYPO 25GX1X1/2 BEV (NEEDLE) ×2 IMPLANT
NEEDLE 18GX1X1/2 (RX/OR ONLY) (NEEDLE) IMPLANT
NEEDLE HYPO 25GX1X1/2 BEV (NEEDLE) IMPLANT
NS IRRIG 1000ML POUR BTL (IV SOLUTION) IMPLANT
PACK ARTHROSCOPY DSU (CUSTOM PROCEDURE TRAY) ×3 IMPLANT
PAD ARMBOARD 7.5X6 YLW CONV (MISCELLANEOUS) ×6 IMPLANT
PADDING CAST COTTON 6X4 STRL (CAST SUPPLIES) ×3 IMPLANT
PORT APPOLLO RF 90DEGREE MULTI (SURGICAL WAND) IMPLANT
SPONGE T-LAP 4X18 ~~LOC~~+RFID (SPONGE) ×2 IMPLANT
SUCTION FRAZIER TIP 8 FR DISP (SUCTIONS) ×1
SUCTION TUBE FRAZIER 8FR DISP (SUCTIONS) IMPLANT
SUT ETHILON 3 0 PS 1 (SUTURE) ×2 IMPLANT
SUT MNCRL AB 3-0 PS2 27 (SUTURE) ×1 IMPLANT
SYR 20ML ECCENTRIC (SYRINGE) ×2 IMPLANT
SYR CONTROL 10ML LL (SYRINGE) IMPLANT
SYR TB 1ML LUER SLIP (SYRINGE) ×2 IMPLANT
TOWEL GREEN STERILE (TOWEL DISPOSABLE) ×3 IMPLANT
TOWEL GREEN STERILE FF (TOWEL DISPOSABLE) ×2 IMPLANT
TUBE CONNECTING 12X1/4 (SUCTIONS) ×3 IMPLANT
TUBING ARTHROSCOPY IRRIG 16FT (MISCELLANEOUS) ×3 IMPLANT
WATER STERILE IRR 1000ML POUR (IV SOLUTION) ×2 IMPLANT

## 2022-01-30 SURGICAL SUPPLY — 43 items
BAG COUNTER SPONGE SURGICOUNT (BAG) ×2 IMPLANT
BAND RUBBER #18 3X1/16 STRL (MISCELLANEOUS) IMPLANT
BLADE SURG 15 STRL LF DISP TIS (BLADE) ×1 IMPLANT
BLADE SURG 15 STRL SS (BLADE) ×1
BNDG COHESIVE 2X5 TAN STRL LF (GAUZE/BANDAGES/DRESSINGS) IMPLANT
BNDG COHESIVE 4X5 TAN ST LF (GAUZE/BANDAGES/DRESSINGS) IMPLANT
BNDG COHESIVE 4X5 TAN STRL (GAUZE/BANDAGES/DRESSINGS) ×2 IMPLANT
BNDG GAUZE ELAST 4 BULKY (GAUZE/BANDAGES/DRESSINGS) ×4 IMPLANT
CHLORAPREP W/TINT 26 (MISCELLANEOUS) ×2 IMPLANT
CORD BIPOLAR FORCEPS 12FT (ELECTRODE) ×2 IMPLANT
CUFF TOURN SGL QUICK 18X4 (TOURNIQUET CUFF) ×1 IMPLANT
DRAIN JP 7F FLT 3/4 PRF SI HBL (DRAIN) ×1 IMPLANT
DRAPE SURG 17X23 STRL (DRAPES) ×2 IMPLANT
DRSG ADAPTIC 3X8 NADH LF (GAUZE/BANDAGES/DRESSINGS) ×1 IMPLANT
DRSG EMULSION OIL 3X3 NADH (GAUZE/BANDAGES/DRESSINGS) ×2 IMPLANT
EVACUATOR SILICONE 100CC (DRAIN) ×1 IMPLANT
GAUZE SPONGE 4X4 12PLY STRL (GAUZE/BANDAGES/DRESSINGS) ×2 IMPLANT
GLOVE SRG 8 PF TXTR STRL LF DI (GLOVE) ×1 IMPLANT
GLOVE SURG ENC MOIS LTX SZ7.5 (GLOVE) ×2 IMPLANT
GLOVE SURG LTX SZ6.5 (GLOVE) ×2 IMPLANT
GLOVE SURG UNDER POLY LF SZ7 (GLOVE) ×2 IMPLANT
GLOVE SURG UNDER POLY LF SZ8 (GLOVE) ×1
GOWN STRL REUS W/ TWL LRG LVL3 (GOWN DISPOSABLE) ×2 IMPLANT
GOWN STRL REUS W/ TWL XL LVL3 (GOWN DISPOSABLE) ×1 IMPLANT
GOWN STRL REUS W/TWL LRG LVL3 (GOWN DISPOSABLE) ×2
GOWN STRL REUS W/TWL XL LVL3 (GOWN DISPOSABLE) ×1
KIT BASIN OR (CUSTOM PROCEDURE TRAY) ×2 IMPLANT
NDL HYPO 25X1 1.5 SAFETY (NEEDLE) IMPLANT
NEEDLE HYPO 25X1 1.5 SAFETY (NEEDLE) IMPLANT
NS IRRIG 1000ML POUR BTL (IV SOLUTION) ×2 IMPLANT
PACK ORTHO EXTREMITY (CUSTOM PROCEDURE TRAY) ×2 IMPLANT
PAD CAST 4YDX4 CTTN HI CHSV (CAST SUPPLIES) IMPLANT
PADDING CAST ABS 4INX4YD NS (CAST SUPPLIES) ×1
PADDING CAST ABS COTTON 4X4 ST (CAST SUPPLIES) IMPLANT
PADDING CAST COTTON 4X4 STRL (CAST SUPPLIES) ×1
SET IRRIG Y TYPE TUR BLADDER L (SET/KITS/TRAYS/PACK) IMPLANT
SPONGE T-LAP 4X18 ~~LOC~~+RFID (SPONGE) ×1 IMPLANT
STOCKINETTE 6  STRL (DRAPES) ×1
STOCKINETTE 6 STRL (DRAPES) ×1 IMPLANT
SUT ETHILON O TP 1 (SUTURE) ×1 IMPLANT
SUT VICRYL RAPIDE 4/0 PS 2 (SUTURE) ×1 IMPLANT
SYR 10ML LL (SYRINGE) IMPLANT
UNDERPAD 30X36 HEAVY ABSORB (UNDERPADS AND DIAPERS) ×2 IMPLANT

## 2022-01-30 NOTE — Anesthesia Procedure Notes (Signed)
Procedure Name: Intubation ?Date/Time: 01/30/2022 5:05 PM ?Performed by: Annamary Carolin, CRNA ?Pre-anesthesia Checklist: Patient identified, Emergency Drugs available, Suction available and Patient being monitored ?Patient Re-evaluated:Patient Re-evaluated prior to induction ?Oxygen Delivery Method: Circle System Utilized ?Preoxygenation: Pre-oxygenation with 100% oxygen ?Induction Type: IV induction ?Ventilation: Mask ventilation without difficulty ?Laryngoscope Size: Mac and 3 ?Grade View: Grade I ?Tube type: Oral ?Tube size: 7.0 mm ?Number of attempts: 1 ?Airway Equipment and Method: Stylet and Oral airway ?Placement Confirmation: ETT inserted through vocal cords under direct vision, positive ETCO2 and breath sounds checked- equal and bilateral ?Secured at: 22 cm ?Tube secured with: Tape ?Dental Injury: Teeth and Oropharynx as per pre-operative assessment  ? ? ? ? ?

## 2022-01-30 NOTE — Progress Notes (Signed)
I was mistakenly called while not on call for a consultation on this patient for a right knee effusion concerning for septic arthritis by the internal medicine resident at [facility] on 01/29/2022.  I then spoke with internal medicine attending Dr. Jimmye Norman about the patient at 1934 on 01/29/2022 in order to ensure that an appropriate plan of care was nevertheless in place.   ? ?I was informed by Dr. Jimmye Norman informed that Dr. Sherry Ruffing in the ED had evaluated the patient's right knee pain and effusion with concern for septic arthritis, but had elected not to perform aspiration due to the patient's mental status.  Apparently Dr. Sherry Ruffing then told the resident that instead of acutely performing aspiration, aspiration should be delayed and orthopaedic surgery should be consulted to come perform the aspiration at some point.  Apparently no attempt at 2-physician consent or contacting a family member was made, and aspiration had been deferred entirely to consultation.  Dr. Jimmye Norman then informed me over the phone that she would evaluate the patient following our call and perform aspiration herself if warranted.   ? ?Review of the records indicates that the patient actually arrived to the ED at 2040 on 01/28/2022.  First mention of indication for aspiration noted by EDP Dr. Daleen Bo in note dated 01/29/2022 at 0907, but was not performed at that time.  Per Dr. Doreene Burke 1724 01/29/2022 note, aspiration was further delayed because he did "not feel comfortable obtaining informed consent for a knee aspiration on her", despite concern for pyogenic knee infection.  There is no documentation of consideration of 2-physician consent or contacting the patient's significant other who as 2 phone numbers listed in her chart.  I was then mistakenly called by internal medicine residents, and subsequently spoke with Dr. Jimmye Norman who did inform me that she would perform the aspiration if indicated.   ? ?Georgeanna Harrison M.D. ?Orthopaedic  Surgery ?[facility]  ?

## 2022-01-30 NOTE — Progress Notes (Signed)
Orthopedic Tech Progress Note ?Patient Details:  ?Natasha Long ?February 13, 1996 ?[id number] ? ?Ortho Devices ?Type of Ortho Device: Velcro wrist forearm splint ?Ortho Device/Splint Location: Lue ?  ?Post Interventions ?Patient Tolerated: Well ? ?Asaph Serena A Kanasia Gayman ?01/30/2022, 2:46 PM ? ?

## 2022-01-30 NOTE — Anesthesia Preprocedure Evaluation (Addendum)
Anesthesia Evaluation  ?Patient identified by MRN, date of birth, ID band ?Patient awake ? ? ? ?Reviewed: ?Allergy & Precautions, NPO status , Patient's Chart, lab work & pertinent test results ? ?Airway ?Mallampati: III ? ?TM Distance: >3 FB ?Neck ROM: Full ? ? ? Dental ? ?(+) Dental Advisory Given, Poor Dentition ?  ?Pulmonary ?asthma , Current Smoker,  ?  ?Pulmonary exam normal ?breath sounds clear to auscultation ? ? ? ? ? ? Cardiovascular ?hypertension (does not take meds), Normal cardiovascular exam ?Rhythm:Regular Rate:Normal ? ? ?  ?Neuro/Psych ?PSYCHIATRIC DISORDERS Anxiety Depression Bipolar Disorder negative neurological ROS ?   ? GI/Hepatic ?GERD  Medicated and Controlled,(+)  ?  ? substance abuse ? cocaine use and marijuana use,   ?Endo/Other  ?BMI 36 ? Renal/GU ?ESRF and DialysisRenal disease (HD MWF)K 3.8 this AM ?ESRD on HD but has not had HD in 2-3weeks  ?negative genitourinary ?  ?Musculoskeletal ?negative musculoskeletal ROS ?(+)  ? Abdominal ?(+) + obese,   ?Peds ? Hematology ? ?(+) Blood dyscrasia, anemia , Hb 7.4, plt 145   ?Anesthesia Other Findings ?Homelessness  ? Reproductive/Obstetrics ? ?  ? ? ? ? ? ? ? ? ? ? ? ? ? ?  ?  ? ? ? ? ? ?Anesthesia Physical ?Anesthesia Plan ? ?ASA: 4 ? ?Anesthesia Plan: General  ? ?Post-op Pain Management:   ? ?Induction: Intravenous ? ?PONV Risk Score and Plan: 2 and Ondansetron, Dexamethasone, Midazolam and Treatment may vary due to age or medical condition ? ?Airway Management Planned: Oral ETT ? ?Additional Equipment: None ? ?Intra-op Plan:  ? ?Post-operative Plan: Extubation in OR ? ?Informed Consent: I have reviewed the patients History and Physical, chart, labs and discussed the procedure including the risks, benefits and alternatives for the proposed anesthesia with the patient or authorized representative who has indicated his/her understanding and acceptance.  ? ? ? ?Dental advisory given ? ?Plan Discussed with:  CRNA ? ?Anesthesia Plan Comments: (Very difficult to illicit answers from in preop, only answers with 1-2 words at a time. )  ? ? ? ? ?Anesthesia Quick Evaluation ? ?

## 2022-01-30 NOTE — Op Note (Signed)
? ?  Date of Surgery: 01/30/2022 ? ?INDICATIONS: Natasha Long is a 26 y.o.-year-old female with right knee pyogenic septic arthritis as well as left wrist pain.  The risk and benefits of the procedure with discussed in detail and documented in the pre-operative evaluation. ? ?PREOPERATIVE DIAGNOSIS: 1.  Right knee pyogenic arthritis ?2.  Right knee lateral meniscus tear ? ?POSTOPERATIVE DIAGNOSIS: Same. ? ?PROCEDURE: 1.  Right knee lateral meniscal debridement ?2.  Right knee extensive synovectomy ? ?SURGEON: Vanetta Mulders, MD ? ?ASSISTANT: Vonda Antigua ? ?ANESTHESIA:  general ? ?IV FLUIDS AND URINE: See anesthesia record. ? ?ANTIBIOTICS: Ancef 2 g ? ?ESTIMATED BLOOD LOSS: 10 mL. ? ?IMPLANTS:  ?* No implants in log * ? ?DRAINS: None ? ?CULTURES: None ? ?COMPLICATIONS: none ? ?DESCRIPTION OF PROCEDURE:  ?EUA: ?Examination under anesthesia: A careful examination under anesthesia was performed.  Knee ROM motion was: 0-135 ?Lachman: Normal ?Pivot Shift: Normal ?Posterior drawer: normal.   ?Varus stability in full extension: normal.   ?Varus stability in 30 degrees of flexion: normal.  ?Valgus stability in full extension: normal.   ?Valgus stability in 30 degrees of flexion: normal.  ?Posterolateral drawer: normal ?  ?Intra-operative findings: A thorough arthroscopic examination of the knee was performed.  The findings are: ?1. Suprapatellar pouch: Normal ?2. Undersurface of median ridge: Normal ?3. Medial patellar facet: Normal ?4. Lateral patellar facet: Normal ?5. Trochlea: Normal ?6. Lateral gutter/popliteus tendon: Normal ?7. Hoffa's fat pad: Normal ?8. Medial gutter/plica: Normal ?9. ACL: Normal ?10. PCL: Normal ?11. Medial meniscus: Normal ?12. Medial compartment cartilage: Normal ?13. Lateral meniscus: Complex fraying of the white white zone of the mid body into the anterior third ?14. Lateral compartment cartilage: Grade 3 cartilage loss of the femur. ?  ?The patient was identified in the preoperative holding  area.  Correct site was marked according universal protocol.  The decision was made to aspirate the left wrist in addition to the right knee as she has findings consistent with pyogenic arthritis of the right knee.  She is subsequently taken back to the operating room.  Anesthesia was induced.  We began with a right knee arthroscopy.  Standard anterior lateral portal was used.  This was done with 11 blade.  Diagnostic arthroscopy ensued.  A shaver was introduced via medial portal and the lateral meniscus was debrided mechanically with a shaver.  At that point a systematic synovectomy was performed debriding the superior pouch lateral medial gutter purulent material as well as the fat pad anteriorly.  This was done back to healthy margin.  A total of 6 L was run through the knee arthroscopically.  Wounds were closed with 3-0 Monocryl in a buried fashion fluid was evacuated.  Dressings were placed with gauze and Tegaderm. ? ? ? ?POSTOPERATIVE PLAN: She will be weightbearing as tolerated on the right leg.  She should begin physical therapy with range of motion of the right knee as tolerated.  Follow-up the cultures of the right knee and narrow accordingly ? ?Yevonne Pax, MD ?1:28 PM ? ? ? ?

## 2022-01-30 NOTE — Progress Notes (Signed)
S/p Left septic wrist washout ?Penrose drain left--will likely pull in 24-48 hours ?Leave splint/dressing undisturbed until then. ?Will begin OT for ROM  ex. ? ?Micheline Rough, MD ?Hand Surgery ?

## 2022-01-30 NOTE — Procedures (Signed)
Procedure: Right knee aspiration and injection ?  ?Indication: Right knee effusion(s) ?  ?Surgeon: Silvestre Gunner, PA-C ?  ?Assist: None ?  ?Anesthesia: Topical refrigerant ?  ?EBL: None ?  ?Complications: None ?  ?Findings: After risks/benefits explained patient desires to undergo procedure. Consent obtained and time out performed. The right knee was sterilely prepped and aspirated. 22ml cloudy pale yellow fluid obtained. 67ml 0.5% Marcaine instilled. Pt tolerated the procedure well. ?  ?  ?  ?Lisette Abu, PA-C ?Orthopedic Surgery ?[phone] ?

## 2022-01-30 NOTE — TOC Progression Note (Signed)
Transition of Care (TOC) - Initial/Assessment Note  ? ? ?Patient Details  ?Name: Natasha Long ?MRN: [id number] ?Date of Birth: 1996/10/24 ? ?Transition of Care (TOC) CM/SW Contact:    ?Paulene Floor Adriannah Steinkamp, LCSWA ?Phone Number: ?01/30/2022, 4:04 PM ? ?Clinical Narrative:                 ?CSW received consult for homelessness and attempted to met with the patient at bedside.  The patient was very drowsy and would open her eyes when CSW would call her name and then fall back asleep.  CSW was unable to complete the assessment at this time. ? ?TOC will continue to follow.   ? ?  ?  ? ? ?Patient Goals and CMS Choice ?  ?  ?  ? ?Expected Discharge Plan and Services ?  ?  ?  ?  ?  ?                ?  ?  ?  ?  ?  ?  ?  ?  ?  ?  ? ?Prior Living Arrangements/Services ?  ?  ?  ?       ?  ?  ?  ?  ? ?Activities of Daily Living ?Home Assistive Devices/Equipment: None ?ADL Screening (condition at time of admission) ?Patient's cognitive ability adequate to safely complete daily activities?: Yes ?Is the patient deaf or have difficulty hearing?: No ?Does the patient have difficulty seeing, even when wearing glasses/contacts?: No ?Does the patient have difficulty concentrating, remembering, or making decisions?: No ?Patient able to express need for assistance with ADLs?: Yes ?Does the patient have difficulty dressing or bathing?: No ?Independently performs ADLs?: Yes (appropriate for developmental age) ?Does the patient have difficulty walking or climbing stairs?: No ?Weakness of Legs: Right ?Weakness of Arms/Hands: None ? ?Permission Sought/Granted ?  ?  ?   ?   ?   ?   ? ?Emotional Assessment ?  ?  ?  ?  ?  ?  ? ?Admission diagnosis:  End stage renal disease ([place]) [N18.6] ?Dialysis patient, noncompliant ([place]) [Z91.15] ?Effusion of right knee [M25.461] ?Knee pain, acute [M25.569] ?Osteoarthritis of right knee, unspecified osteoarthritis type [M17.11] ?Patient Active Problem List  ? Diagnosis Date Noted  ? Hyperkalemia 01/30/2022  ?  Effusion of right knee   ? Arthritis, septic ([place])   ? Dialysis patient, noncompliant ([place])   ? Knee pain, acute 01/29/2022  ? Hypokalemia 01/29/2022  ? Hyperphosphatemia 01/29/2022  ? Chest pain   ? Hypervolemia associated with renal insufficiency 12/23/2021  ? Elevated troponin 12/23/2021  ? Tobacco use 08/11/2021  ? Nipple discharge 08/11/2021  ? Breast pain 08/11/2021  ? Exposure to sexually transmitted disease (STD) 08/11/2021  ? Complication of vascular dialysis catheter 02/12/2021  ? Moderate protein-calorie malnutrition ([place]) 01/27/2021  ? Allergy, unspecified, initial encounter 01/23/2021  ? Anemia in chronic kidney disease 01/23/2021  ? ESRD on dialysis [facility]) 01/23/2021  ? Iron deficiency anemia, unspecified 01/23/2021  ? Other specified coagulation defects ([place]) 01/23/2021  ? Secondary hyperparathyroidism of renal origin ([place]) 01/23/2021  ? Anemia   ? Suicidal ideations   ? Influenza vaccine refused 12/19/2020  ? Morbid obesity ([place]) 01/30/2020  ? 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 Cocaine use, 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[place]) 01/30/2020  ? Affective psychosis, bipolar ([place]) 06/27/2019  ? Borderline personality disorder ([place]) 11/16/2018  ? Moderate cannabis use disorder ([place]) 11/16/2018  ? Severe recurrent major depression without psychotic features ([place]) 11/15/2018  ?  Chronic hypertension 04/04/2018  ? PTSD (post-traumatic stress disorder) 04/01/2018  ? ?PCP:  [name], MD ?Pharmacy:   ?[facility] [place], [place] - [address] ?[address] ?[place] [address] ?Phone: [phone] Fax: [phone] ? ? ? ? ?Social Determinants of Health (SDOH) Interventions ?  ? ?Readmission Risk Interventions ?No flowsheet data found. ? ? ?

## 2022-01-30 NOTE — Op Note (Addendum)
01/28/2022 - 01/30/2022 ? ?4:36 PM ? ?PATIENT:  Natasha Long  26 y.o. female ? ?PRE-OPERATIVE DIAGNOSIS:  septic arthritis L wrist ? ?POST-OPERATIVE DIAGNOSIS:  Same ? ?PROCEDURE:  Left wrist arthrotomy for irrigation of left wrist infection ? ?SURGEON: Rayvon Char. Grandville Silos, MD ? ?PHYSICIAN ASSISTANT: none ? ?ANESTHESIA:  general ? ?SPECIMENS:  None ? ?DRAINS:   small penrose x1 ? ?EBL:  less than 50 mL ? ?PREOPERATIVE INDICATIONS:  [name] is a  26 y.o. female with septic left wrist and right knee, having already undergone tx of R knee by Dr. Sammuel Hines. ? ?The risks benefits and alternatives were discussed with the patient preoperatively including but not limited to the risks of infection, bleeding, nerve injury, cardiopulmonary complications, the need for revision surgery, among others, and the patient verbalized understanding and consented to proceed. ? ?OPERATIVE IMPLANTS: none ? ?OPERATIVE PROCEDURE:  The patient was escorted to the operative theatre and placed in a supine position.  A surgical ?time-out? was performed during which the planned procedure, proposed operative site, and the correct patient identity were compared to the operative consent and agreement confirmed by the circulating nurse according to current facility policy.  Following application of a tourniquet to the operative extremity forearm, the exposed skin was prepped with Chloraprep and draped in the usual sterile fashion.  The limb was exsanguinated with gravity and the tourniquet inflated to approximately 142mmHg higher than systolic BP. ? ?A 1 to 1.5 inch incision was made sharply over the dorsal aspect of the wrist.  Spreading dissection was carried to the retinaculum, which was divided longitudinally at the distal aspect of the third compartment.  The EPL was reflected radially, the fourth compartment tendons ulnarly and a transverse dorsal capsulotomy made at the radiocarpal joint.  Gross pus was encountered.  This was removed  and then irrigated.  The midcarpal joint was entered with a hemostat bluntly, and some seropurulent fluid was obtained and this was irrigated copiously.  Radial and dorsal ulnar small stab incisions were made after directing a hemostat from the major incision inside-out.  Once the skin was tented, small counterincision was made over it.  Using these 3 "portals", a Penrose drain was placed in the radiocarpal joint, exiting through the dorsal ulnar incision.  The irrigant fluid was then attached to it and the joint was then irrigated from inside out.  Irrigant fluid clear.  Once all the irrigant and then run to the joint in this manner, the capsulotomies were not closed.  The incision was closed loosely with 3 horizontal mattress sutures and a bulky dressing was applied.  Prior to dressing application, the incisions were provided local anesthetic infiltration.  The drain was placed to bulb suction and she was awakened and taken recovery in stable condition, breathing spontaneously ? ?DISPOSITION: She will be returned to the floor for continued inpatient care for multifocal septic arthritis and other medical issues. ? ? ? ? ? ? ? ? ? ?

## 2022-01-30 NOTE — Transfer of Care (Signed)
Immediate Anesthesia Transfer of Care Note ? ?Patient: Natasha Long ? ?Procedure(s) Performed: IRRIGATION AND DEBRIDEMENT EXTREMITY (Left) ? ?Patient Location: PACU ? ?Anesthesia Type:General ? ?Level of Consciousness: awake, alert , oriented and patient cooperative ? ?Airway & Oxygen Therapy: Patient Spontanous Breathing and Patient connected to face mask oxygen ? ?Post-op Assessment: Report given to RN, Post -op Vital signs reviewed and stable and Patient moving all extremities X 4 ? ?Post vital signs: Reviewed and stable ? ?Last Vitals:  ?Vitals Value Taken Time  ?BP 153/77 01/30/22 1758  ?Temp 37.3 ?C 01/30/22 1755  ?Pulse    ?Resp 16 01/30/22 1801  ?SpO2 100 % 01/30/22 1755  ?Vitals shown include unvalidated device data. ? ?Last Pain:  ?Vitals:  ? 01/30/22 1755  ?TempSrc:   ?PainSc: Asleep  ?   ? ?Patients Stated Pain Goal: 0 (01/29/22 2330) ? ?Complications: No notable events documented. ?

## 2022-01-30 NOTE — Significant Event (Signed)
The patient is status post right knee aspiration with purulent drainage from this.  I called the lab and spoke with the technologist directly.  Cell count was 52,000 with 98% neutrophils.  No crystals were seen.  There is a question of whether or not bacteria were seen.  That being said Natasha Long does have a dampened immune system as a result of her kidney disease and not having dialysis for the last 2 weeks.  Given these numbers I do feel strongly that this is an acute infection and I recommended right knee irrigation and debridement.  Natasha Long is also complaining of left wrist pain.  A hand consult has been obtained at this time.  I will plan on performing left wrist aspiration under fluoroscopy in order to assist my hand colleagues ?

## 2022-01-30 NOTE — Progress Notes (Signed)
? ?Subjective: Patient febrile to 103.1, given tylenol, repeat 102.3. New wrist pain, leading to concern for seeing. Ortho consulted who recommended empiric abx and tap in the AM. ? ?This AM, patient somnolent, not participating in assessment.  ? ?Objective: ? ?Vital signs in last 24 hours: ?Vitals:  ? 01/30/22 0030 01/30/22 0128 01/30/22 0228 01/30/22 0328  ?BP: (!) 150/90 (!) 144/73 133/70 (!) 154/81  ?Pulse: (!) 120 (!) 119 (!) 112 (!) 106  ?Resp: '18 18 20 20  ' ?Temp: (!) 103.1 ?F (39.5 ?C) (!) 102.3 ?F (39.1 ?C) (!) 100.4 ?F (38 ?C) 100.2 ?F (37.9 ?C)  ?TempSrc: Oral Oral Oral Oral  ?SpO2: 99% 97% 98% 99%  ?Weight:      ?Height:      ? ?Physical Exam ?Constitutional:  She is somnolent, arousable to pain, but not to voice.  ?HENT: Normocephalic and atraumatic. Splitting of middle of lower lip; dried blood ?Cardiovascular: Regular rhythm. Tachycardia present. ?Pulmonary: Pulmonary effort is normal on RA. CTAB. ?Abdominal:  Abdomen is flat. Bowel sounds are normal. There is no distension. There is no abdominal TTP.  ?Musculoskeletal/Skin: Right lower extremity held in externally rotated position. Swelling of knee with calor; minimal erythema appreciated today.  Slight improvement of tender to palpation, but more somnolent today so may not be the case. No pain with motion of R ankle. Warmth of bilateral ankles, L>R ?LLE without knee pain, swelling, calor. Calor of ankle and pain with motion and intermittent TTP.  ?RUE with normal temperature throughout, no TTP, no swelling.  ?LUE with severe pain of wrist to motion and palpation, as well as calor particularly on the flexor surface. Forearm with some warmth, and medial surface of arm around port with pain to palpation and warmth.   ?Neurological: Patient uncooperative 2/2 somnolence ?Psychiatric:  Mood irritable 2/2 pain and manipulation of extremities.   ? ?Assessment/Plan: ? ?Principal Problem: ?  Knee pain, acute ?Active Problems: ?  ESRD on dialysis [facility]) ?   Hyperphosphatemia ?  Effusion of right knee ?  Hyperkalemia ? ?Natasha Long is a 26 year old female with a history of nephrotic syndrome on hemodialysis who has been noncompliant with sessions for the past 2.5 weeks, borderline personality disorder, and recent homelessness who presented with traumatic knee injury after falling down a flight of stairs.  On chart review, patient has a history of visiting the ED for dialysis then leaving AMA. ? ?Acute traumatic knee injury ?Septic joint with likely dissemination ?Leukocytosis with neutrophilic predominance ?Patient fell down a flight of stairs on 2/28.  CT with large Baker's cyst with partial rupture. Patient with R knee pain only initially; overnight developed L wrist pain. This AM, pain also in L arm, and warmth noted of bilateral ankles. Overnight, patient met SIRS criteria with WBC 15.7, febrile to 103.1, and tachycardia. Continues to meet SIRS criteria today with WBC 13.7 and tachycardia. No longer febrile, but T 100.2. ?- Ortho formally consulted for possible septic joint by overnight team and graciously following, appreciate aspiration and recommendation for empiric abx ?- Dilaudid 0.5 mg IV x1 given for breakthrough pain; avoid opiate/narcotic pain medications if possible due to patient hx of substance use ?- Tylenol 1000 mg every 8 hours for pain and fever ?-- Initiated IV Vanc and Cefepime overnight; will refine based on aspiration results.  ?-- Trend daily CBC ? ?ESRD with HD noncompliance ?Hyperphosphatemia 2/2 missed HD ?Hyperkalemia 2/2 missed HD, resolved  ?Patient reports history of nephrotic syndrome.  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  Patient  reports noncompliance with hemodialysis due to homelessness. Multiple presentations at multiple ED's after missing HD. Admission labs: K+ 5.3, phosphorus 11.9, creatinine 30.45. Today: K+ 3.8, phosphorous 8.4, and Cr 20.31. ?- Patient dialyzed today; will be dialyzed daily until euvolemic ?- Trend renal function panel  daily ? ?  ?Anemia of ESRD ?Hemoglobin 7.4 on admission ->7.6 ->7.4 today.  Patient denied symptoms of anemia including lightheadedness/dizziness, weakness, lethargy on admission; uncooperative with assessment today ?- Trend daily CBC ?- Transfuse Hgb less than 7.0 ?  ?History of borderline personality disorder ?Major depressive disorder ?Generalized anxiety disorder ?Follows at [facility] for care.  Home medications: Abilify 10 mg (patient-reported dose, 7.5 documented), gabapentin 300 mg 3 times daily, and hydroxyzine 25 mg 3 times daily as needed. ?- Continue home Abilify 7.5 mg and hydroxyzine 25 mg PRN anxiety ?- TOC consult placed for homelessness ?  ?Hypertension ?Patient non-compliant with home medications. BP unstable, SBP up to 200 on admission. 24 hr SBP 133-197, MR 154/81. BP likely worsened by both HD and medication noncompliance; improving with both.  ?-- Continue home Losartan 50 mg daily ?-- Monitory daily vitals ? ?Prior to Admission Living Arrangement: Homeless, awaiting housing at [employer]. ?Anticipated Discharge Location: Pending ?Barriers to Discharge: Homelessness ?Dispo: Anticipated discharge in approximately 2-3 day(s).  ? [name], MD ?01/30/2022, 8:58 AM ?Pager: [phone] ?After 5pm on weekdays and 1pm on weekends: On Call pager [phone]  ?

## 2022-01-30 NOTE — Anesthesia Procedure Notes (Signed)
Procedure Name: Intubation ?Date/Time: 01/30/2022 12:47 PM ?Performed by: Leonor Liv, CRNA ?Pre-anesthesia Checklist: Patient identified, Emergency Drugs available, Suction available and Patient being monitored ?Patient Re-evaluated:Patient Re-evaluated prior to induction ?Oxygen Delivery Method: Circle System Utilized ?Preoxygenation: Pre-oxygenation with 100% oxygen ?Induction Type: IV induction, Rapid sequence and Cricoid Pressure applied ?Laryngoscope Size: Mac and 3 ?Grade View: Grade I ?Tube type: Oral ?Tube size: 7.0 mm ?Number of attempts: 1 ?Airway Equipment and Method: Stylet and Oral airway ?Placement Confirmation: ETT inserted through vocal cords under direct vision, positive ETCO2 and breath sounds checked- equal and bilateral ?Secured at: 21 cm ?Tube secured with: Tape ?Dental Injury: Teeth and Oropharynx as per pre-operative assessment  ?Comments: Intubation performed by Danton Clap paramedic student [place] ? ? ? ? ?

## 2022-01-30 NOTE — Progress Notes (Signed)
Patient ID: Natasha Long, female   DOB: December 01, 1995, 26 y.o.   MRN: [id number] ? ?Patient oral temperature now WNL. ? ?BP (!) 153/88 (BP Location: Right Arm)   Pulse (!) 110   Temp 98.3 ?F (36.8 ?C) (Oral)   Resp 19   Ht 5\' 8"  (1.727 m)   Wt 108 kg   LMP 01/28/2022   SpO2 100%   BMI 36.20 kg/m?  ? ? ?Haydee Salter, RN ? ?
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
## 2022-01-30 NOTE — Consult Note (Signed)
ORTHOPAEDIC CONSULTATION  REQUESTING PHYSICIAN: Angelica Pou, MD  Chief Complaint: Right knee pain  HPI: Natasha Long is a 26 y.o. female who presents with complex medical history on dialysis for end-stage renal disease presents with right knee pain per her report after a fall down several stairs.  Of note I was consulted at 2 AM on 01/30/21 for right knee aspiration as the patient was reportedly having fevers.  She is currently admitted to the hospitalist floor.  Per discussion with my colleague Georgeanna Harrison, MD a plan was made for the medical team to perform right knee aspiration on the preceding night.  Per report this had not happened.  Orthopedics consulted at this time for further assessment and evaluation.  Of note the majority of this history was collected via collateral information as when I presented for consultation with the patient she was not willing to talk with me.  From report from the nurse she was conversing overnight and able to state that she did have knee pain.  Past Medical History:  Diagnosis Date   Asthma    as a child, no problem as an adult, no inhaler   Complication of anesthesia    woke up before tube removed, 1 time fought nurses   ESRD on hemodialysis [facility])    M-W-F   History of borderline personality disorder    Hypertension    diagnosed as child; stopped meds at 8 yo   Insomnia    Neuromuscular disorder ([place])    peripheral neuropathy   PTSD (post-traumatic stress disorder)    Past Surgical History:  Procedure Laterality Date   AV FISTULA PLACEMENT Left 10/18/2020   Procedure: LEFT ARM ARTERIOVENOUS (AV) FISTULA CREATION;  Surgeon: Serafina Mitchell, MD;  Location: [facility] OR;  Service: Vascular;  Laterality: Left;   CHOLECYSTECTOMY     extraction of wisdom teeth     FISTULA SUPERFICIALIZATION Left 02/13/2021   Procedure: LEFT BRACHIOCEPHALIC ARTERIOVENOUS FISTULA SUPERFICIALIZATION;  Surgeon: Serafina Mitchell, MD;  Location: [facility] OR;  Service:  Vascular;  Laterality: Left;   RENAL BIOPSY     x 2   TUNNELED VENOUS CATHETER PLACEMENT  02/11/2021   [facility]   Social History   Socioeconomic History   Marital status: Soil scientist    Spouse name: Not on file   Number of children: 1   Years of education: Not on file   Highest education level: 12th grade  Occupational History   Occupation: unemployed  Tobacco Use   Smoking status: Every Day    Packs/day: 0.50    Years: 10.00    Pack years: 5.00    Types: Cigarettes   Smokeless tobacco: Never  Vaping Use   Vaping Use: Never used  Substance and Sexual Activity   Alcohol use: Yes    Comment: "maybe 3 a month."- liquor   Drug use: Yes    Types: Marijuana, Cocaine    Comment: reports daily use of both   Sexual activity: Not Currently    Birth control/protection: None  Other Topics Concern   Not on file  Social History Narrative   Not on file   Social Determinants of Health   Financial Resource Strain: Not on file  Food Insecurity: Not on file  Transportation Needs: Not on file  Physical Activity: Not on file  Stress: Not on file  Social Connections: Not on file   Family History  Adopted: Yes  Problem Relation Age of Onset   Diabetes Other  Hypertension Other    - negative except otherwise stated in the family history section Allergies  Allergen Reactions   Prozac [Fluoxetine Hcl] Other (See Comments)    Panic attack    Wellbutrin [Bupropion] Other (See Comments)    Panic attack   Prednisone Other (See Comments)    Pt states that this med caused pancreatitis.    Prior to Admission medications   Medication Sig Start Date End Date Taking? Authorizing Provider  acetaminophen (TYLENOL) 650 MG CR tablet Take 650-1,300 mg by mouth every 8 (eight) hours as needed for pain.    [provider]  ARIPiprazole (ABILIFY) 15 MG tablet Take 0.5 tablets (7.5 mg total) by mouth daily. 12/27/21   Regalado, Belkys A, MD  B Complex-C-Folic Acid  (RENAL VITAMIN) 0.8 MG TABS Take 1 tablet by mouth daily. 10/29/21   [provider]  Blood Pressure Monitoring (BLOOD PRESSURE MONITOR 7) DEVI 1 Units by Does not apply route daily. Measure blood pressure daily 02/29/20   [name], MD  calcitRIOL (ROCALTROL) 0.25 MCG capsule Take 0.25 mcg by mouth See admin instructions. 0.72mcg daily on dialysis days - Tuesday's,Thursday's, and Saturday's 09/25/20   [provider]  calcium acetate (PHOSLO) 667 MG capsule Take 667 mg by mouth 3 (three) times daily. 07/03/21   [provider]  gabapentin (NEURONTIN) 600 MG tablet Take 0.5 tablets (300 mg total) by mouth 3 (three) times daily. 10/31/21   [name], MD  hydrOXYzine (VISTARIL) 25 MG capsule Take 1 capsule (25 mg total) by mouth every 8 (eight) hours as needed. Patient taking differently: Take 25 mg by mouth every 8 (eight) hours as needed for anxiety. 08/11/21   [name], MD  lidocaine-prilocaine (EMLA) cream Apply 1 application topically daily as needed (port site access on dialysis days (Tuesday's, Thursday's, and Saturday's). 03/06/21   [provider]  losartan (COZAAR) 50 MG tablet Take 50 mg by mouth daily. 12/11/21   [provider]  omeprazole (PRILOSEC) 20 MG capsule Take 1 capsule (20 mg total) by mouth daily. 08/11/21   [name], MD  ondansetron (ZOFRAN ODT) 4 MG disintegrating tablet Take 1 tablet (4 mg total) by mouth every 8 (eight) hours as needed for nausea or vomiting. 03/18/21   LongWonda Olds, MD   CT Knee Right Wo Contrast  Result Date: 01/29/2022 CLINICAL DATA:  Right knee pain status post fall 2 days ago. EXAM: CT OF THE RIGHT KNEE WITHOUT CONTRAST TECHNIQUE: Multidetector CT imaging of the right knee was performed according to the standard protocol. Multiplanar CT image reconstructions were also generated. RADIATION DOSE REDUCTION: This exam was performed according to the departmental dose-optimization program  which includes automated exposure control, adjustment of the mA and/or kV according to patient size and/or use of iterative reconstruction technique. COMPARISON:  None. FINDINGS: Bones/Joint/Cartilage No acute fracture or dislocation. Normal alignment. Large joint effusion. Small loose body along the anterior aspect of the distal ACL insertion. 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 Mild osteoarthritis of the medial femorotibial compartment. Large Baker's cyst. Ligaments Ligaments are suboptimally evaluated by CT. Muscles and Tendons Muscles are normal. No muscle atrophy. No intramuscular fluid collection or hematoma. Quadriceps tendon and patellar tendon are intact. Soft tissue No fluid collection or hematoma. No soft tissue mass. Mild soft tissue edema superficial to the medial gastrocnemius muscle likely reflecting a leaking or partially ruptured Baker's cyst. IMPRESSION:  1. No acute fracture or dislocation of the right knee. 2. 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 3. Large joint effusion and large Baker's cyst. Mild soft tissue edema superficial to the medial gastrocnemius muscle likely reflecting a leaking or partially ruptured Baker's cyst. Electronically Signed   By: Kathreen Devoid M.D.   On: 01/29/2022 10:03   DG Knee Complete 4 Views Right  Result Date: 01/29/2022 CLINICAL DATA:  atraumatic knee pain EXAM: RIGHT KNEE - COMPLETE 4+ VIEW COMPARISON:  None. FINDINGS: No evidence of fracture or dislocation. Small joint effusion. Lateral tibiofemoral compartment moderate to severe degenerative changes. No aggressive appearing bone abnormality. Soft tissues are unremarkable. IMPRESSION: 1. No acute displaced fracture or dislocation. 2. Lateral tibiofemoral compartment moderate to severe  degenerative changes. 3. Small joint effusion. Electronically Signed   By: Iven Finn M.D.   On: 01/29/2022 00:07     Positive ROS: All other systems have been reviewed and were otherwise negative with the exception of those mentioned in the HPI and as above.  Physical Exam: General: No acute distress Cardiovascular: No pedal edema Respiratory: No cyanosis, no use of accessory musculature GI: No organomegaly, abdomen is soft and non-tender Skin: No lesions in the area of chief complaint Neurologic: Sensation intact distally Psychiatric: Patient is at baseline mood and affect Lymphatic: No axillary or cervical lymphadenopathy  MUSCULOSKELETAL:  Right knee is tender with palpation.  She is not spontaneously moving the right knee.  She sits with a pillow under the right knee.  She is not willing to participate in any other portion of the physical examination and questioning she remained silent  Independent Imaging Review: X-ray right knee 3 views: Tricompartmental osteoarthritis  CT right knee: There is tricompartmental osteoarthritis with subchondral bone cysts  Assessment: 26 year old female with right knee pain in the setting of an elevated white count.  She is per report intermittently homeless and has not had her dialysis in 2-1/2 weeks.  Presents today with right knee pain.  Orthopedics was consulted earlier this morning for further assessment.  We will plan for right knee aspiration and will intervene as needed based on the results of the aspiration.  Plan: Plan for right knee aspiration this morning with intervention based on the results of this  Thank you for the consult and the opportunity to see Ms. Geoffery Spruce, MD [facility] 7:46 AM

## 2022-01-30 NOTE — Consult Note (Signed)
ORTHOPAEDIC CONSULTATION HISTORY & PHYSICAL REQUESTING PHYSICIAN: Angelica Pou, MD  Chief Complaint:  Left wrist pain/swelling  HPI: Natasha Long is a 26 y.o. female who was admitted after missing HD multiple days and becoming uremic. Her main reason for presenting to the hospital though was for right knee pain. The next morning, when she less encephalopathic, she also began to c/o left wrist pain. Knee arthrocentesis was done and was c/w septic arthritis and she was taken to the OR for washout. While there she underwent wrist arthrocentesis which appeared likely for septic arthritis and hand surgery was consulted. Wrist aspirate with GPC on gram stain.  She is vague on timing/duration. She is RHD.  She is presently in the PACU and sleepy but arousable.      Past Medical History:  Diagnosis Date   Asthma    as a child, no problem as an adult, no inhaler   Complication of anesthesia    woke up before tube removed, 1 time fought nurses   ESRD on hemodialysis [facility])    M-W-F   History of borderline personality disorder    Hypertension    diagnosed as child; stopped meds at 35 yo   Insomnia    Neuromuscular disorder ([place])    peripheral neuropathy   PTSD (post-traumatic stress disorder)    Past Surgical History:  Procedure Laterality Date   AV FISTULA PLACEMENT Left 10/18/2020   Procedure: LEFT ARM ARTERIOVENOUS (AV) FISTULA CREATION;  Surgeon: Serafina Mitchell, MD;  Location: [facility] OR;  Service: Vascular;  Laterality: Left;   CHOLECYSTECTOMY     extraction of wisdom teeth     FISTULA SUPERFICIALIZATION Left 02/13/2021   Procedure: LEFT BRACHIOCEPHALIC ARTERIOVENOUS FISTULA SUPERFICIALIZATION;  Surgeon: Serafina Mitchell, MD;  Location: [facility] OR;  Service: Vascular;  Laterality: Left;   RENAL BIOPSY     x 2   TUNNELED VENOUS CATHETER PLACEMENT  02/11/2021   [facility]   Social History   Socioeconomic History   Marital status: Soil scientist    Spouse name: Not on  file   Number of children: 1   Years of education: Not on file   Highest education level: 12th grade  Occupational History   Occupation: unemployed  Tobacco Use   Smoking status: Every Day    Packs/day: 0.50    Years: 10.00    Pack years: 5.00    Types: Cigarettes   Smokeless tobacco: Never  Vaping Use   Vaping Use: Never used  Substance and Sexual Activity   Alcohol use: Yes    Comment: "maybe 3 a month."- liquor   Drug use: Yes    Types: Marijuana, Cocaine    Comment: reports daily use of both   Sexual activity: Not Currently    Birth control/protection: None  Other Topics Concern   Not on file  Social History Narrative   Not on file   Social Determinants of Health   Financial Resource Strain: Not on file  Food Insecurity: Not on file  Transportation Needs: Not on file  Physical Activity: Not on file  Stress: Not on file  Social Connections: Not on file   Family History  Adopted: Yes  Problem Relation Age of Onset   Diabetes Other    Hypertension Other    Allergies  Allergen Reactions   Prozac [Fluoxetine Hcl] Other (See Comments)    Panic attack    Wellbutrin [Bupropion] Other (See Comments)    Panic attack   Prednisone Other (See  Comments)    Pt states that this med caused pancreatitis.    Prior to Admission medications   Medication Sig Start Date End Date Taking? Authorizing Provider  acetaminophen (TYLENOL) 650 MG CR tablet Take 650-1,300 mg by mouth every 8 (eight) hours as needed for pain.    [provider]  ARIPiprazole (ABILIFY) 15 MG tablet Take 0.5 tablets (7.5 mg total) by mouth daily. 12/27/21   Regalado, Belkys A, MD  B Complex-C-Folic Acid (RENAL VITAMIN) 0.8 MG TABS Take 1 tablet by mouth daily. 10/29/21   [provider]  Blood Pressure Monitoring (BLOOD PRESSURE MONITOR 7) DEVI 1 Units by Does not apply route daily. Measure blood pressure daily 02/29/20   [name], MD  calcitRIOL (ROCALTROL) 0.25 MCG capsule Take  0.25 mcg by mouth See admin instructions. 0.21mcg daily on dialysis days - Tuesday's,Thursday's, and Saturday's 09/25/20   [provider]  calcium acetate (PHOSLO) 667 MG capsule Take 667 mg by mouth 3 (three) times daily. 07/03/21   [provider]  gabapentin (NEURONTIN) 600 MG tablet Take 0.5 tablets (300 mg total) by mouth 3 (three) times daily. 10/31/21   [name], MD  hydrOXYzine (VISTARIL) 25 MG capsule Take 1 capsule (25 mg total) by mouth every 8 (eight) hours as needed. Patient taking differently: Take 25 mg by mouth every 8 (eight) hours as needed for anxiety. 08/11/21   [name], MD  lidocaine-prilocaine (EMLA) cream Apply 1 application topically daily as needed (port site access on dialysis days (Tuesday's, Thursday's, and Saturday's). 03/06/21   [provider]  losartan (COZAAR) 50 MG tablet Take 50 mg by mouth daily. 12/11/21   [provider]  omeprazole (PRILOSEC) 20 MG capsule Take 1 capsule (20 mg total) by mouth daily. 08/11/21   [name], MD  ondansetron (ZOFRAN ODT) 4 MG disintegrating tablet Take 1 tablet (4 mg total) by mouth every 8 (eight) hours as needed for nausea or vomiting. 03/18/21   Long, Wonda Olds, MD   DG Wrist 2 Views Left  Result Date: 01/30/2022 CLINICAL DATA:  Left wrist pain EXAM: LEFT WRIST - 2 VIEW COMPARISON:  None. FINDINGS: There is no evidence of fracture or dislocation. Carpus is normally aligned. There is no evidence of arthropathy or other focal bone abnormality. Soft tissue edema about the wrist. IMPRESSION: 1. No fracture or dislocation of the left wrist. The carpus is normally aligned. 2.  Soft tissue edema about the wrist. Electronically Signed   By: Delanna Ahmadi M.D.   On: 01/30/2022 14:48   CT Knee Right Wo Contrast  Result Date: 01/29/2022 CLINICAL DATA:  Right knee pain status post fall 2 days ago. EXAM: CT OF THE RIGHT KNEE WITHOUT CONTRAST TECHNIQUE: Multidetector CT imaging of the right  knee was performed according to the standard protocol. Multiplanar CT image reconstructions were also generated. RADIATION DOSE REDUCTION: This exam was performed according to the departmental dose-optimization program which includes automated exposure control, adjustment of the mA and/or kV according to patient size and/or use of iterative reconstruction technique. COMPARISON:  None. FINDINGS: Bones/Joint/Cartilage No acute fracture or dislocation. Normal alignment. Large joint effusion. Small loose body along the anterior aspect of the distal ACL insertion. 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 Mild osteoarthritis of the medial femorotibial compartment. Large Baker's cyst. Ligaments Ligaments are suboptimally evaluated by CT. Muscles and Tendons Muscles are normal. No muscle atrophy. No  intramuscular fluid collection or hematoma. Quadriceps tendon and patellar tendon are intact. Soft tissue No fluid collection or hematoma. No soft tissue mass. Mild soft tissue edema superficial to the medial gastrocnemius muscle likely reflecting a leaking or partially ruptured Baker's cyst. IMPRESSION: 1. No acute fracture or dislocation of the right knee. 2. 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 3. Large joint effusion and large Baker's cyst. Mild soft tissue edema superficial to the medial gastrocnemius muscle likely reflecting a leaking or partially ruptured Baker's cyst. Electronically Signed   By: Kathreen Devoid M.D.   On: 01/29/2022 10:03   DG Knee Complete 4 Views Right  Result Date: 01/29/2022 CLINICAL DATA:  atraumatic knee pain EXAM: RIGHT KNEE - COMPLETE 4+ VIEW COMPARISON:  None. FINDINGS: No evidence of fracture or dislocation. Small joint effusion. Lateral tibiofemoral compartment moderate  to severe degenerative changes. No aggressive appearing bone abnormality. Soft tissues are unremarkable. IMPRESSION: 1. No acute displaced fracture or dislocation. 2. Lateral tibiofemoral compartment moderate to severe degenerative changes. 3. Small joint effusion. Electronically Signed   By: Iven Finn M.D.   On: 01/29/2022 00:07    Positive ROS: All other systems have been reviewed and were otherwise negative with the exception of those mentioned in the HPI and as above.  Physical Exam: Vitals: Refer to EMR. Constitutional:  WD, WN, NAD HEENT:  NCAT, EOMI Neuro/Psych:  Alert & oriented to person, place, and time; appropriate mood & affect Lymphatic: No generalized extremity edema or lymphadenopathy Extremities / MSK:  The extremities are normal with respect to appearance, ranges of motion, joint stability, muscle strength/tone, sensation, & perfusion except as otherwise noted:  Left shoulder, elbow, wrist, digits- no skin wounds, severe TTP wrist, severe pain with ~10 degrees PROM, no instability, no blocks to motion             Sens  Ax/R/M/U intact             Mot   Ax/ R/ PIN/ M/ AIN/ U grossly intact             Rad 2+   Assessment: Septic arthritis of left wrist  Plan: To OR for arthrotomy for drainage. G/R/O reviewed.  Written consent already obtained from signif other given patient's present inability to consent for self for urgent procedure to try to preserve long-term health of the involved wrist.  Rayvon Char. [address], [place], [place]  [id number] Office: [phone] Mobile: [phone]  01/30/2022, 4:18 PM

## 2022-01-30 NOTE — Consult Note (Signed)
Reason for Consult:Left wrist pain Referring Physician: Dorian Pod Time called: 0730 Time at bedside: 0923   Natasha Long is an 26 y.o. female.  HPI: [place] was admitted after missing HD multiple days and becoming uremic. Her main reason for presenting to the hospital though was for right knee pain. The next morning, when she less encephalopathic, she also began to c/o left wrist pain. Knee arthrocentesis was done and was c/w septic arthritis and she was taken to the OR for washout. While there she underwent wrist arthrocentesis which appeared likely for septic arthritis and hand surgery was consulted. She is vague on timing/duration. She is RHD.  Past Medical History:  Diagnosis Date   Asthma    as a child, no problem as an adult, no inhaler   Complication of anesthesia    woke up before tube removed, 1 time fought nurses   ESRD on hemodialysis [facility])    M-W-F   History of borderline personality disorder    Hypertension    diagnosed as child; stopped meds at 44 yo   Insomnia    Neuromuscular disorder ([place])    peripheral neuropathy   PTSD (post-traumatic stress disorder)     Past Surgical History:  Procedure Laterality Date   AV FISTULA PLACEMENT Left 10/18/2020   Procedure: LEFT ARM ARTERIOVENOUS (AV) FISTULA CREATION;  Surgeon: Serafina Mitchell, MD;  Location: [facility] OR;  Service: Vascular;  Laterality: Left;   CHOLECYSTECTOMY     extraction of wisdom teeth     FISTULA SUPERFICIALIZATION Left 02/13/2021   Procedure: LEFT BRACHIOCEPHALIC ARTERIOVENOUS FISTULA SUPERFICIALIZATION;  Surgeon: Serafina Mitchell, MD;  Location: [facility];  Service: Vascular;  Laterality: Left;   RENAL BIOPSY     x 2   TUNNELED VENOUS CATHETER PLACEMENT  02/11/2021   [facility]    Family History  Adopted: Yes  Problem Relation Age of Onset   Diabetes Other    Hypertension Other     Social History:  reports that she has been smoking cigarettes. She has a 5.00 pack-year smoking  history. She has never used smokeless tobacco. She reports current alcohol use. She reports current drug use. Drugs: Marijuana and Cocaine.  Allergies:  Allergies  Allergen Reactions   Prozac [Fluoxetine Hcl] Other (See Comments)    Panic attack    Wellbutrin [Bupropion] Other (See Comments)    Panic attack   Prednisone Other (See Comments)    Pt states that this med caused pancreatitis.     Medications: I have reviewed the patient's current medications.  Results for orders placed or performed during the hospital encounter of 01/28/22 (from the past 48 hour(s))  Sedimentation rate     Status: Abnormal   Collection Time: 01/28/22 11:48 PM  Result Value Ref Range   Sed Rate 79 (H) 0 - 22 mm/hr    Comment: Performed at [facility], [address] [place], [place] [address]  CBC with Differential     Status: Abnormal   Collection Time: 01/28/22 11:48 PM  Result Value Ref Range   WBC 13.3 (H) 4.0 - 10.5 K/uL   RBC 2.45 (L) 3.87 - 5.11 MIL/uL   Hemoglobin 7.4 (L) 12.0 - 15.0 g/dL   HCT 22.6 (L) 36.0 - 46.0 %   MCV 92.2 80.0 - 100.0 fL   MCH 30.2 26.0 - 34.0 pg   MCHC 32.7 30.0 - 36.0 g/dL   RDW 15.2 11.5 - 15.5 %   Platelets 174 150 - 400  K/uL   nRBC 0.0 0.0 - 0.2 %   Neutrophils Relative % 88 %   Neutro Abs 11.9 (H) 1.7 - 7.7 K/uL   Lymphocytes Relative 6 %   Lymphs Abs 0.7 0.7 - 4.0 K/uL   Monocytes Relative 5 %   Monocytes Absolute 0.6 0.1 - 1.0 K/uL   Eosinophils Relative 0 %   Eosinophils Absolute 0.0 0.0 - 0.5 K/uL   Basophils Relative 0 %   Basophils Absolute 0.0 0.0 - 0.1 K/uL   Immature Granulocytes 1 %   Abs Immature Granulocytes 0.06 0.00 - 0.07 K/uL    Comment: Performed at [facility] [address], [place], [place] [address]  Basic metabolic panel     Status: Abnormal   Collection Time: 01/28/22 11:48 PM  Result Value Ref Range   Sodium 136 135 - 145 mmol/L   Potassium 5.1 3.5 - 5.1 mmol/L   Chloride 104 98 - 111 mmol/L   CO2 10 (L) 22 -  32 mmol/L   Glucose, Bld 107 (H) 70 - 99 mg/dL    Comment: Glucose reference range applies only to samples taken after fasting for at least 8 hours.   BUN 155 (H) 6 - 20 mg/dL   Creatinine, Ser 31.38 (H) 0.44 - 1.00 mg/dL    Comment: RESULTS CONFIRMED BY MANUAL DILUTION   Calcium 8.5 (L) 8.9 - 10.3 mg/dL   GFR, Estimated 1 (L) >60 mL/min    Comment: (NOTE) Calculated using the CKD-EPI Creatinine Equation (2021)    Anion gap 22 (H) 5 - 15    Comment: REPEATED TO VERIFY Performed at [facility] [address], [place], [place] [address]   C-reactive protein     Status: Abnormal   Collection Time: 01/28/22 11:48 PM  Result Value Ref Range   CRP 8.5 (H) <1.0 mg/dL    Comment: Performed at [facility], [place] [address], [place], [place] [address]  I-Stat Beta hCG blood, ED ([facility], [facility], [facility] only)     Status: None   Collection Time: 01/29/22 12:44 AM  Result Value Ref Range   I-stat hCG, quantitative <5.0 <5 mIU/mL   Comment 3            Comment:   GEST. AGE      CONC.  (mIU/mL)   <=1 WEEK        5 - 50     2 WEEKS       50 - 500     3 WEEKS       100 - 10,000     4 WEEKS     1,000 - 30,000        FEMALE AND NON-PREGNANT FEMALE:     LESS THAN 5 mIU/mL   Hepatitis B surface antigen     Status: None   Collection Time: 01/29/22  1:15 PM  Result Value Ref Range   Hepatitis B Surface Ag NON REACTIVE NON REACTIVE    Comment: Performed at [facility], [place] [address], [place], [place] [address]  Hepatitis B surface antibody     Status: Abnormal   Collection Time: 01/29/22  1:15 PM  Result Value Ref Range   Hep B S Ab Reactive (A) NON REACTIVE    Comment: (NOTE) Consistent with immunity, greater than 9.9 mIU/mL.  Performed at [facility], [place] [address], [place], [place] [address]   Hepatitis B surface antibody,quantitative     Status:  None   Collection Time: 01/29/22  1:15 PM  Result Value Ref Range   Hepatitis B-Post >1,000.0 Immunity>9.9 mIU/mL    Comment:  (NOTE)  Status of Immunity                     Anti-HBs Level  ------------------                     -------------- Inconsistent with Immunity                   0.0 - 9.9 Consistent with Immunity                          >9.9 Performed At: [facility] [place], [place] [id number] Rush Farmer MD PP:[id number]   CBC     Status: Abnormal   Collection Time: 01/29/22  1:15 PM  Result Value Ref Range   WBC 15.7 (H) 4.0 - 10.5 K/uL   RBC 2.47 (L) 3.87 - 5.11 MIL/uL   Hemoglobin 7.6 (L) 12.0 - 15.0 g/dL   HCT 22.6 (L) 36.0 - 46.0 %   MCV 91.5 80.0 - 100.0 fL   MCH 30.8 26.0 - 34.0 pg   MCHC 33.6 30.0 - 36.0 g/dL   RDW 15.1 11.5 - 15.5 %   Platelets 155 150 - 400 K/uL   nRBC 0.0 0.0 - 0.2 %    Comment: Performed at [facility], [place] [address], [place], [place] [address]  Renal function panel     Status: Abnormal   Collection Time: 01/29/22  1:15 PM  Result Value Ref Range   Sodium 134 (L) 135 - 145 mmol/L   Potassium 5.3 (H) 3.5 - 5.1 mmol/L   Chloride 104 98 - 111 mmol/L   CO2 8 (L) 22 - 32 mmol/L   Glucose, Bld 99 70 - 99 mg/dL    Comment: Glucose reference range applies only to samples taken after fasting for at least 8 hours.   BUN 158 (H) 6 - 20 mg/dL   Creatinine, Ser 30.45 (H) 0.44 - 1.00 mg/dL    Comment: RESULTS CONFIRMED BY MANUAL DILUTION   Calcium 8.2 (L) 8.9 - 10.3 mg/dL   Phosphorus 11.9 (H) 2.5 - 4.6 mg/dL   Albumin 2.8 (L) 3.5 - 5.0 g/dL   GFR, Estimated 1 (L) >60 mL/min    Comment: (NOTE) Calculated using the CKD-EPI Creatinine Equation (2021)    Anion gap 22 (H) 5 - 15    Comment: Electrolytes repeated to confirm. Performed at [facility], [place] [address], [place], [place] [address]   CBC     Status: Abnormal   Collection Time: 01/30/22  3:21 AM  Result Value Ref Range   WBC 13.7 (H) 4.0 - 10.5 K/uL   RBC 2.45 (L) 3.87 - 5.11 MIL/uL   Hemoglobin 7.4 (L) 12.0 - 15.0 g/dL   HCT 21.5 (L) 36.0 - 46.0 %   MCV 87.8 80.0 -  100.0 fL   MCH 30.2 26.0 - 34.0 pg   MCHC 34.4 30.0 - 36.0 g/dL   RDW 15.0 11.5 - 15.5 %   Platelets 145 (L) 150 - 400 K/uL   nRBC 0.0 0.0 - 0.2 %    Comment: Performed at [facility], [place] [address] [place], [place] [address]  Protime-INR     Status: Abnormal   Collection Time: 01/30/22  3:21 AM  Result Value Ref Range   Prothrombin Time 17.0 (H) 11.4 - 15.2 seconds   INR 1.4 (H) 0.8 - 1.2    Comment: (NOTE) INR goal varies based on device and disease states. Performed at [facility], [place] [address] [place], [place] [address]   APTT     Status: Abnormal   Collection Time: 01/30/22  3:21 AM  Result Value Ref Range   aPTT 39 (H) 24 - 36 seconds    Comment:        IF BASELINE aPTT IS ELEVATED, SUGGEST PATIENT RISK ASSESSMENT BE USED TO DETERMINE APPROPRIATE ANTICOAGULANT THERAPY. Performed at [facility], [place] [address], [place], [place] [address]   Renal function panel     Status: Abnormal   Collection Time: 01/30/22  3:21 AM  Result Value Ref Range   Sodium 133 (L) 135 - 145 mmol/L   Potassium 3.8 3.5 - 5.1 mmol/L    Comment: NO VISIBLE HEMOLYSIS   Chloride 100 98 - 111 mmol/L   CO2 18 (L) 22 - 32 mmol/L   Glucose, Bld 127 (H) 70 - 99 mg/dL    Comment: Glucose reference range applies only to samples taken after fasting for at least 8 hours.   BUN 104 (H) 6 - 20 mg/dL   Creatinine, Ser 20.31 (H) 0.44 - 1.00 mg/dL   Calcium 8.3 (L) 8.9 - 10.3 mg/dL   Phosphorus 8.4 (H) 2.5 - 4.6 mg/dL   Albumin 2.4 (L) 3.5 - 5.0 g/dL   GFR, Estimated 2 (L) >60 mL/min    Comment: (NOTE) Calculated using the CKD-EPI Creatinine Equation (2021)    Anion gap 15 5 - 15    Comment: Performed at [facility] [address] [place],  [address]  Resp Panel by RT-PCR (Flu A&B, Covid) Nasopharyngeal Swab     Status: None   Collection Time: 01/30/22  4:40 AM   Specimen: Nasopharyngeal Swab; Nasopharyngeal(NP) swabs in vial transport medium  Result Value Ref  Range   SARS Coronavirus 2 by RT PCR NEGATIVE NEGATIVE    Comment: (NOTE) SARS-CoV-2 target nucleic acids are NOT DETECTED.  The SARS-CoV-2 RNA is generally detectable in upper respiratory specimens during the acute phase of infection. The lowest concentration of SARS-CoV-2 viral copies this assay can detect is 138 copies/mL. A negative result does not preclude SARS-Cov-2 infection and should not be used as the sole basis for treatment or other patient management decisions. A negative result may occur with  improper specimen collection/handling, submission of specimen other than nasopharyngeal swab, presence of viral mutation(s) within the areas targeted by this assay, and inadequate number of viral copies(<138 copies/mL). A negative result must be combined with clinical observations, patient history, and epidemiological information. The expected result is Negative.  Fact Sheet for Patients:  EntrepreneurPulse.com.au  Fact Sheet for Healthcare Providers:  IncredibleEmployment.be  This test is no t yet approved or cleared by the [place] FDA and  has been authorized for detection and/or diagnosis of SARS-CoV-2 by FDA under an Emergency Use Authorization (EUA). This EUA will remain  in effect (meaning this test can be used) for the duration of the COVID-19 declaration under Section 564(b)(1) of the Act, 21 U.S.C.section 360bbb-3(b)(1), unless the authorization is terminated  or revoked sooner.       Influenza A by PCR NEGATIVE NEGATIVE   Influenza B by PCR NEGATIVE NEGATIVE    Comment: (NOTE) The Xpert Xpress SARS-CoV-2/FLU/RSV plus assay is intended as an  aid in the diagnosis of influenza from Nasopharyngeal swab specimens and should not be used as a sole basis for treatment. Nasal washings and aspirates are unacceptable for Xpert Xpress SARS-CoV-2/FLU/RSV testing.  Fact Sheet for  Patients: EntrepreneurPulse.com.au  Fact Sheet for Healthcare Providers: IncredibleEmployment.be  This test is not yet approved or cleared by the [place] FDA and has been authorized for detection and/or diagnosis of SARS-CoV-2 by FDA under an Emergency Use Authorization (EUA). This EUA will remain in effect (meaning this test can be used) for the duration of the COVID-19 declaration under Section 564(b)(1) of the Act, 21 U.S.C. section 360bbb-3(b)(1), unless the authorization is terminated or revoked.  Performed at [facility], [place] [address] [place], [place] [address]   Body fluid culture w Gram Stain     Status: None (Preliminary result)   Collection Time: 01/30/22  9:41 AM   Specimen: Synovium; Body Fluid  Result Value Ref Range   Specimen Description SYNOVIAL FLUID    Special Requests RIGHT KNEE    Gram Stain      ABUNDANT WBC PRESENT,BOTH PMN AND MONONUCLEAR RARE GRAM POSITIVE COCCI Gram Stain Report Called to,Read Back By and Verified With: S,BOKSHAN MD@ 1155 01/30/22 EB CORRECTED RESULTS PREVIOUSLY REPORTED AS: NO ORGANISMS SEEN CORRECTED RESULTS CALLED TO: S,BOKSHAN MD @1250  01/30/22 EB Performed at [facility], [place] [address], [place], [place] [address]    Culture PENDING    Report Status PENDING   Synovial cell count + diff, w/ crystals     Status: Abnormal   Collection Time: 01/30/22  9:41 AM  Result Value Ref Range   Color, Synovial STRAW (A) YELLOW   Appearance-Synovial TURBID (A) CLEAR   Crystals, Fluid NO CRYSTALS SEEN    WBC, Synovial 52,500 (H) 0 - 200 /cu mm    Comment: COUNT MAY BE INACCURATE DUE TO FIBRIN CLUMPS.   Neutrophil, Synovial 98 (H) 0 - 25 %   Lymphocytes-Synovial Fld 0 0 - 20 %   Monocyte-Macrophage-Synovial Fluid 2 (L) 50 - 90 %   Eosinophils-Synovial 0 0 - 1 %    Comment: Performed at [facility] [address], [place], [place] [address]  Type and screen [place]     Status: None   Collection Time: 01/30/22 12:25 PM  Result Value Ref Range   ABO/RH(D) A NEG    Antibody Screen NEG    Sample Expiration      02/02/2022,2359 Performed at [facility], [place] [address], [place], [place] [address]     CT Knee Right Wo Contrast  Result Date: 01/29/2022 CLINICAL DATA:  Right knee pain status post fall 2 days ago. EXAM: CT OF THE RIGHT KNEE WITHOUT CONTRAST TECHNIQUE: Multidetector CT imaging of the right knee was performed according to the standard protocol. Multiplanar CT image reconstructions were also generated. RADIATION DOSE REDUCTION: This exam was performed according to the departmental dose-optimization program which includes automated exposure control, adjustment of the mA and/or kV according to patient size and/or use of iterative reconstruction technique. COMPARISON:  None. FINDINGS: Bones/Joint/Cartilage No acute fracture or dislocation. Normal alignment. Large joint effusion. Small loose body along the anterior aspect of the distal ACL insertion. 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 Mild osteoarthritis of the medial femorotibial compartment. Large Baker's cyst. Ligaments Ligaments are suboptimally evaluated by CT. Muscles and Tendons Muscles are normal. No muscle  atrophy. No intramuscular fluid collection or hematoma. Quadriceps tendon and patellar tendon are intact. Soft tissue No fluid collection or hematoma. No soft tissue mass. Mild soft tissue edema superficial to the medial gastrocnemius muscle likely reflecting a leaking or partially ruptured Baker's cyst. IMPRESSION: 1. No acute fracture or dislocation of the right knee. 2. 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 3. Large joint effusion and large Baker's cyst. Mild  soft tissue edema superficial to the medial gastrocnemius muscle likely reflecting a leaking or partially ruptured Baker's cyst. Electronically Signed   By: Kathreen Devoid M.D.   On: 01/29/2022 10:03   DG Knee Complete 4 Views Right  Result Date: 01/29/2022 CLINICAL DATA:  atraumatic knee pain EXAM: RIGHT KNEE - COMPLETE 4+ VIEW COMPARISON:  None. FINDINGS: No evidence of fracture or dislocation. Small joint effusion. Lateral tibiofemoral compartment moderate to severe degenerative changes. No aggressive appearing bone abnormality. Soft tissues are unremarkable. IMPRESSION: 1. No acute displaced fracture or dislocation. 2. Lateral tibiofemoral compartment moderate to severe degenerative changes. 3. Small joint effusion. Electronically Signed   By: Iven Finn M.D.   On: 01/29/2022 00:07    Review of Systems  Unable to perform ROS: Mental status change  Musculoskeletal:  Positive for arthralgias (Left wrist).  Blood pressure (!) 151/74, pulse (!) 120, temperature 99.4 F (37.4 C), resp. rate 18, height 5\' 8"  (1.727 m), weight 108.9 kg, last menstrual period 01/28/2022, SpO2 100 %. Physical Exam Constitutional:      General: She is not in acute distress.    Appearance: She is well-developed. She is not diaphoretic.  HENT:     Head: Normocephalic and atraumatic.  Eyes:     General: No scleral icterus.       Right eye: No discharge.        Left eye: No discharge.     Conjunctiva/sclera: Conjunctivae normal.  Cardiovascular:     Rate and Rhythm: Normal rate and regular rhythm.  Pulmonary:     Effort: Pulmonary effort is normal. No respiratory distress.  Musculoskeletal:     Cervical back: Normal range of motion.     Comments: Left shoulder, elbow, wrist, digits- no skin wounds, severe TTP wrist, severe pain with ~10 degrees PROM, no instability, no blocks to motion  Sens  Ax/R/M/U intact  Mot   Ax/ R/ PIN/ M/ AIN/ U grossly intact  Rad 2+  Skin:    General: Skin is warm and dry.   Neurological:     Mental Status: She is alert.  Psychiatric:        Mood and Affect: Mood normal.        Behavior: Behavior normal.    Assessment/Plan: Left wrist pain -- Plan I&D this afternoon by Dr. [place] Silos. Please keep NPO.    Lisette Abu, PA-C Orthopedic Surgery [phone] 01/30/2022, 2:21 PM

## 2022-01-30 NOTE — Progress Notes (Signed)
Pharmacy Antibiotic Note ? ?Natasha Long is a 26 y.o. female admitted on 01/28/2022 with  knee pain following traumatic fall with concern for septic joint .  Pharmacy has been consulted for vancomycin and cefepime dosing. ? ?Plan: ?Vancomycin 2000mg  x1 IV then 1000mg  after each HD.  Goal pre-HD level 15-25 mcg/mL. Check random vancomycin levels as needed. ?Cefepime 2g IV qHD. ? ? ?Height: 5\' 8"  (172.7 cm) ?Weight: 108 kg (238 lb 1.6 oz) ?IBW/kg (Calculated) : 63.9 ? ?Temp (24hrs), Avg:100.4 ?F (38 ?C), Min:98 ?F (36.7 ?C), Max:103.1 ?F (39.5 ?C) ? ?Recent Labs  ?Lab 01/28/22 ?2348 01/29/22 ?1315 01/30/22 ?0321  ?WBC 13.3* 15.7* 13.7*  ?CREATININE 31.38* 30.45* 20.31*  ?  ?Estimated Creatinine Clearance: 5.4 mL/min (A) (by C-G formula based on SCr of 20.31 mg/dL (H)).   ? ?Allergies  ?Allergen Reactions  ? Prozac [Fluoxetine Hcl] Other (See Comments)  ?  Panic attack   ? Wellbutrin [Bupropion] Other (See Comments)  ?  Panic attack  ? Prednisone Other (See Comments)  ?  Pt states that this med caused pancreatitis.   ? ? ?Thank you for allowing pharmacy to be a part of this patient?s care. ? ?Alene Mires Nalaya Wojdyla ?01/30/2022 5:25 AM ? ?

## 2022-01-30 NOTE — Interval H&P Note (Signed)
History and Physical Interval Note: ? ?01/30/2022 ?12:33 PM ? ?Natasha Long  has presented today for surgery, with the diagnosis of Right Knee Infection.  The various methods of treatment have been discussed with the patient and family. After consideration of risks, benefits and other options for treatment, the patient has consented to  Procedure(s): ?ARTHROSCOPY KNEE AND IRRIIGATION AND DEBRIDMENT (Right) , left wrist aspiration as a surgical intervention.  The patient's history has been reviewed, patient examined, no change in status, stable for surgery.  I have reviewed the patient's chart and labs.  Questions were answered to the patient's satisfaction.   ? ? ?Vanetta Mulders ? ? ?

## 2022-01-30 NOTE — Progress Notes (Signed)
Patient ID: Natasha Long, female   DOB: 1995-12-29, 26 y.o.   MRN: [id number] ? ?NT reported yellow mews due to fever. Medications administered. Cold packs under knees placed. MD notified. Recheck was 98.3. ? ?Haydee Salter, RN ? ?
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
## 2022-01-30 NOTE — Anesthesia Preprocedure Evaluation (Addendum)
Anesthesia Evaluation  ?Patient identified by MRN, date of birth, ID band ?Patient awake ? ? ? ?Reviewed: ?Allergy & Precautions, NPO status , Patient's Chart, lab work & pertinent test results ? ?Airway ?Mallampati: II ? ?TM Distance: >3 FB ?Neck ROM: Full ? ? ? Dental ?no notable dental hx. ? ?  ?Pulmonary ?Current Smoker,  ?  ?Pulmonary exam normal ?breath sounds clear to auscultation ? ? ? ? ? ? Cardiovascular ?hypertension, Normal cardiovascular exam+ Valvular Problems/Murmurs MR  ?Rhythm:Regular Rate:Normal ? ? ?  ?Neuro/Psych ?Bipolar Disorder negative neurological ROS ?   ? GI/Hepatic ?negative GI ROS, (+)  ?  ? substance abuse ? cocaine use,   ?Endo/Other  ?negative endocrine ROS ? Renal/GU ?DialysisRenal disease  ?negative genitourinary ?  ?Musculoskeletal ?negative musculoskeletal ROS ?(+)  ? Abdominal ?  ?Peds ?negative pediatric ROS ?(+)  Hematology ? ?(+) Blood dyscrasia, anemia ,   ?Anesthesia Other Findings ? ? Reproductive/Obstetrics ?negative OB ROS ? ?  ? ? ? ? ? ? ? ? ? ? ? ? ? ?  ?  ? ? ? ? ? ? ? ? ?Anesthesia Physical ?Anesthesia Plan ? ?ASA: 4 ? ?Anesthesia Plan: General  ? ?Post-op Pain Management: Minimal or no pain anticipated  ? ?Induction: Intravenous ? ?PONV Risk Score and Plan: 2 and Ondansetron, Dexamethasone and Treatment may vary due to age or medical condition ? ?Airway Management Planned: Oral ETT ? ?Additional Equipment:  ? ?Intra-op Plan:  ? ?Post-operative Plan: Extubation in OR ? ?Informed Consent: I have reviewed the patients History and Physical, chart, labs and discussed the procedure including the risks, benefits and alternatives for the proposed anesthesia with the patient or authorized representative who has indicated his/her understanding and acceptance.  ? ? ? ?Dental advisory given ? ?Plan Discussed with: CRNA and Surgeon ? ?Anesthesia Plan Comments:   ? ? ? ? ? ?Anesthesia Quick Evaluation ? ?

## 2022-01-30 NOTE — Brief Op Note (Signed)
? ?  Brief Op Note ? ?Date of Surgery: ?01/30/2022 ? ?Preoperative Diagnosis: ?Right Knee Infection ? ?Postoperative Diagnosis: ?same ? ?Procedure: ?Procedure(s): ?ARTHROSCOPY KNEE AND IRRIIGATION AND DEBRIDMENT ? ?Implants: ?* No implants in log * ? ?Surgeons: ?Surgeon(s): ?Vanetta Mulders, MD ? ?Anesthesia: ?General ? ? ? ?Estimated Blood Loss: ?See anesthesia record ? ?Complications: ?None ? ?Condition to PACU: ?Stable ? ?Yevonne Pax, MD ?01/30/2022 ?1:28 PM ? ?

## 2022-01-30 NOTE — Anesthesia Postprocedure Evaluation (Signed)
Anesthesia Post Note ? ?Patient: Natasha Long ? ?Procedure(s) Performed: ARTHROSCOPY KNEE AND IRRIIGATION AND DEBRIDMENT; LEFT WRIST ASPIRATION (Right: Knee) ?LEFT WRIST ASPIRATION (Left: Wrist) ? ?  ? ?Patient location during evaluation: PACU ?Anesthesia Type: General ?Level of consciousness: awake and alert, oriented and patient cooperative ?Pain management: pain level controlled ?Vital Signs Assessment: post-procedure vital signs reviewed and stable ?Respiratory status: spontaneous breathing, nonlabored ventilation and respiratory function stable ?Cardiovascular status: blood pressure returned to baseline and stable ?Postop Assessment: no apparent nausea or vomiting ?Anesthetic complications: no ? ? ?No notable events documented. ? ?Last Vitals:  ?Vitals:  ? 01/30/22 1001 01/30/22 1206  ?BP:  (!) 151/74  ?Pulse:  (!) 120  ?Resp:  18  ?Temp: 36.8 ?C 36.7 ?C  ?SpO2:  100%  ?  ?Last Pain:  ?Vitals:  ? 01/30/22 1206  ?TempSrc: Oral  ?PainSc:   ? ? ?  ?  ?  ?  ?  ?  ? ?Jarome Matin Loranzo Desha ? ? ? ? ?

## 2022-01-30 NOTE — Progress Notes (Signed)
Patient ID: Natasha Long, female   DOB: July 04, 1996, 26 y.o.   MRN: [id number] ?Patient back from surgery. 10/10 pain. Tearful. Would like something to eat. Paged MD as diet order is still npo. ? ?Haydee Salter, RN ? ?

## 2022-01-30 NOTE — Transfer of Care (Signed)
Immediate Anesthesia Transfer of Care Note ? ?Patient: Francene Mcerlean ? ?Procedure(s) Performed: ARTHROSCOPY KNEE AND IRRIIGATION AND DEBRIDMENT; LEFT WRIST ASPIRATION (Right: Knee) ?LEFT WRIST ASPIRATION (Left: Wrist) ? ?Patient Location: PACU ? ?Anesthesia Type:General ? ?Level of Consciousness: drowsy and patient cooperative ? ?Airway & Oxygen Therapy: Patient Spontanous Breathing and Patient connected to face mask oxygen ? ?Post-op Assessment: Report given to RN, Post -op Vital signs reviewed and stable and Patient moving all extremities ? ?Post vital signs: Reviewed and stable ? ?Last Vitals:  ?Vitals Value Taken Time  ?BP 119/57 01/30/22 1338  ?Temp    ?Pulse 104 01/30/22 1344  ?Resp 22 01/30/22 1344  ?SpO2 96 % 01/30/22 1344  ?Vitals shown include unvalidated device data. ? ?Last Pain:  ?Vitals:  ? 01/30/22 1206  ?TempSrc: Oral  ?PainSc:   ?   ? ?Patients Stated Pain Goal: 0 (01/29/22 2330) ? ?Complications: No notable events documented. ?

## 2022-01-30 NOTE — Progress Notes (Signed)
?   01/30/22 0913  ?Assess: MEWS Score  ?Temp (!) 103.1 ?F (39.5 ?C)  ?BP (!) 153/88  ?Pulse Rate (!) 110  ?Resp 19  ?SpO2 100 %  ?Assess: MEWS Score  ?MEWS Temp 2  ?MEWS Systolic 0  ?MEWS Pulse 1  ?MEWS RR 0  ?MEWS LOC 0  ?MEWS Score 3  ?MEWS Score Color Yellow  ?Treat  ?MEWS Interventions Administered scheduled meds/treatments  ?Pain Scale 0-10  ?Pain Score 10  ?Pain Type Acute pain  ?Pain Location Knee  ?Pain Orientation Right  ?Pain Intervention(s) Medication (See eMAR)  ?Take Vital Signs  ?Increase Vital Sign Frequency  Yellow: Q 2hr X 2 then Q 4hr X 2, if remains yellow, continue Q 4hrs  ?Notify: Provider  ?Provider Name/Title Dellis Filbert PA  ?Date Provider Notified 01/30/22  ?Time Provider Notified [phone]  ?Notification Type Call  ?Notification Reason Other (Comment) ?(Pain, fever continues from admission)  ?Provider response En route  ?Date of Provider Response 01/30/22  ?Time of Provider Response [phone]  ?Document  ?Patient Outcome Other (Comment) ?(continue to monitor)  ?Progress note created (see row info) Yes  ? ? ?

## 2022-01-30 NOTE — Plan of Care (Signed)
01/30/22 2:22 AM ?Natasha Long is a 26 year old female with past medical history of arthritis, nephrotic syndrome now on HD TThS, HTN, diastolic heart failure, psychiatric diagnoses, who presented to the ED for knee pain following traumatic fall, unable to bear weight on the RLE.  ED provider consulted orthopedics on presentation yesterday, though no documentation that she was seen.  Patient's care was transferred to oncoming ED provider who noted patient was somnolent and not able to answer questions consistently.  This ED provider did not feel comfortable obtaining informed consent for knee aspiration.  Medicine was called for admission.  Ortho was re-consulted.  Unfortunately night team providers were not present for this conversation.  Decision was made to wait to perform the aspiration until the morning, and continue with pain management. ? ?0000 Contacted by RN for RED MEWS score with patient febrile to 102.39F and tachycardic 116, with BP 170/84. Blood cultures were obtained. Tylenol given. ? ?0130 Patient now febrile up to 103.1 with repeat of 102.3 despite tylenol. Patient was seen at bedside. R knee swollen compared to L with difficult to appreciate erythema.  Patient is warm to touch on all extremities, diaphoretic.  Left wrist mildly swollen compared to right.  Patient endorsing knee pain as well as wrist pain that began after transfer from the ED. Patient denies cough, SOB, dysuria. She is awake alert and answering questions appropriately.  ? ?Concern for septic joint now repeatedly febrile with Tmax 103.1 with increased leukocytosis to 15.7 yesterdays AM labs.  Also with new wrist pain concerning for seeding.   Also considered other sources of infection but this seems unlikely. Would prefer to tap the knee ASAP and culture fluid prior to starting abx.  ? ?Reached out to radiology though no provider present to tap until the a.m.  Reached out to on-call orthopedic provider who recommended starting  empiric antibiotics and will have APP tap in the a.m. unless IMTS is able to tap tonight.  Unfortunately residents are unable to perform procedures on the medical floor unsupervised. Spoke with IMTS on call attending who agreed with above plan. ? ?Plan: ?-Started vancomycin and cefepime ?-Follow-up blood cultures ?-Plan for right knee aspiration and left wrist aspiration in the AM with orthopedics APP vs radiology.  (For wrist tap will need hand consult per orthopedics provider) ? ?Wayland Denis, MD ?01/30/22,  2:56 AM ?Pager: [phone] ?Internal Medicine Resident, PGY-1 ?[facility]  ?  ? ?

## 2022-01-30 NOTE — Progress Notes (Signed)
Pt receives out-pt HD at [facility] on [facility]. Pt arrives at 11:50 for 12:10 chair time. Clinic confirms pt has not been to appointments recently. Clinic SW contacted navigator to say that pt has said that transportation is a barrier to getting to out-pt HD center. Clinic SW has been able to obtain bus passes for pt to use for transportation but pt has not returned to clinic to get passes. Navigator unable to meet with pt due to pt being off the floor. Will assist as needed. ? ?Melven Sartorius ?Renal Navigator ?[phone] ?

## 2022-01-31 ENCOUNTER — Encounter (HOSPITAL_COMMUNITY): Payer: Self-pay | Admitting: Orthopaedic Surgery

## 2022-01-31 DIAGNOSIS — M25561 Pain in right knee: Secondary | ICD-10-CM | POA: Diagnosis not present

## 2022-01-31 DIAGNOSIS — G9340 Encephalopathy, unspecified: Secondary | ICD-10-CM

## 2022-01-31 DIAGNOSIS — Z91199 Patient's noncompliance with other medical treatment and regimen due to unspecified reason: Secondary | ICD-10-CM

## 2022-01-31 DIAGNOSIS — N186 End stage renal disease: Secondary | ICD-10-CM | POA: Diagnosis not present

## 2022-01-31 DIAGNOSIS — Z992 Dependence on renal dialysis: Secondary | ICD-10-CM | POA: Diagnosis not present

## 2022-01-31 DIAGNOSIS — M009 Pyogenic arthritis, unspecified: Secondary | ICD-10-CM | POA: Diagnosis not present

## 2022-01-31 LAB — RENAL FUNCTION PANEL
Albumin: 2.2 g/dL — ABNORMAL LOW (ref 3.5–5.0)
Albumin: 2.2 g/dL — ABNORMAL LOW (ref 3.5–5.0)
Anion gap: 19 — ABNORMAL HIGH (ref 5–15)
Anion gap: 17 — ABNORMAL HIGH (ref 5–15)
BUN: 117 mg/dL — ABNORMAL HIGH (ref 6–20)
BUN: 51 mg/dL — ABNORMAL HIGH (ref 6–20)
CO2: 14 mmol/L — ABNORMAL LOW (ref 22–32)
CO2: 22 mmol/L (ref 22–32)
Calcium: 8.7 mg/dL — ABNORMAL LOW (ref 8.9–10.3)
Calcium: 8.6 mg/dL — ABNORMAL LOW (ref 8.9–10.3)
Chloride: 99 mmol/L (ref 98–111)
Chloride: 97 mmol/L — ABNORMAL LOW (ref 98–111)
Creatinine, Ser: 20.2 mg/dL — ABNORMAL HIGH (ref 0.44–1.00)
Creatinine, Ser: 10.24 mg/dL — ABNORMAL HIGH (ref 0.44–1.00)
GFR, Estimated: 2 mL/min — ABNORMAL LOW (ref >60)
GFR, Estimated: 5 mL/min — ABNORMAL LOW (ref >60)
Glucose, Bld: 80 mg/dL (ref 70–99)
Glucose, Bld: 90 mg/dL (ref 70–99)
Phosphorus: 10.9 mg/dL — ABNORMAL HIGH (ref 2.5–4.6)
Phosphorus: 5.3 mg/dL — ABNORMAL HIGH (ref 2.5–4.6)
Potassium: 4.6 mmol/L (ref 3.5–5.1)
Potassium: 3.2 mmol/L — ABNORMAL LOW (ref 3.5–5.1)
Sodium: 132 mmol/L — ABNORMAL LOW (ref 135–145)
Sodium: 136 mmol/L (ref 135–145)

## 2022-01-31 LAB — CBC
HCT: 19.7 % — ABNORMAL LOW (ref 36.0–46.0)
Hemoglobin: 6.6 g/dL — CL (ref 12.0–15.0)
MCH: 30.4 pg (ref 26.0–34.0)
MCHC: 33.5 g/dL (ref 30.0–36.0)
MCV: 90.8 fL (ref 80.0–100.0)
Platelets: 163 10*3/uL (ref 150–400)
RBC: 2.17 MIL/uL — ABNORMAL LOW (ref 3.87–5.11)
RDW: 15.3 % (ref 11.5–15.5)
WBC: 15.2 10*3/uL — ABNORMAL HIGH (ref 4.0–10.5)
nRBC: 0 % (ref 0.0–0.2)

## 2022-01-31 LAB — CBC WITH DIFFERENTIAL/PLATELET
Abs Immature Granulocytes: 0.06 10*3/uL (ref 0.00–0.07)
Basophils Absolute: 0 10*3/uL (ref 0.0–0.1)
Basophils Relative: 0 %
Eosinophils Absolute: 0 10*3/uL (ref 0.0–0.5)
Eosinophils Relative: 0 %
HCT: 21.3 % — ABNORMAL LOW (ref 36.0–46.0)
Hemoglobin: 7.4 g/dL — ABNORMAL LOW (ref 12.0–15.0)
Immature Granulocytes: 1 %
Lymphocytes Relative: 8 %
Lymphs Abs: 1.1 10*3/uL (ref 0.7–4.0)
MCH: 30.2 pg (ref 26.0–34.0)
MCHC: 34.7 g/dL (ref 30.0–36.0)
MCV: 86.9 fL (ref 80.0–100.0)
Monocytes Absolute: 1.1 10*3/uL — ABNORMAL HIGH (ref 0.1–1.0)
Monocytes Relative: 8 %
Neutro Abs: 11 10*3/uL — ABNORMAL HIGH (ref 1.7–7.7)
Neutrophils Relative %: 83 %
Platelets: 165 10*3/uL (ref 150–400)
RBC: 2.45 MIL/uL — ABNORMAL LOW (ref 3.87–5.11)
RDW: 14.9 % (ref 11.5–15.5)
WBC: 13.2 10*3/uL — ABNORMAL HIGH (ref 4.0–10.5)
nRBC: 0 % (ref 0.0–0.2)

## 2022-01-31 LAB — PREPARE RBC (CROSSMATCH)

## 2022-01-31 MED ORDER — CEFAZOLIN SODIUM-DEXTROSE 1-4 GM/50ML-% IV SOLN
1.0000 g | Freq: Every day | INTRAVENOUS | Status: DC
  Administered 2022-01-31 – 2022-02-01 (×2): 1 g via INTRAVENOUS
  Filled 2022-01-31 (×5): qty 50

## 2022-01-31 MED ORDER — DARBEPOETIN ALFA 100 MCG/0.5ML IJ SOSY: 100.0000 ug | PREFILLED_SYRINGE | INTRAMUSCULAR | Status: DC

## 2022-01-31 MED ORDER — VANCOMYCIN HCL IN DEXTROSE 1-5 GM/200ML-% IV SOLN
1000.0000 mg | Freq: Once | INTRAVENOUS | Status: AC
  Administered 2022-01-31: 1000 mg via INTRAVENOUS
  Filled 2022-01-31: qty 200

## 2022-01-31 MED ORDER — ALTEPLASE 2 MG IJ SOLR: 2.0000 mg | Freq: Once | INTRAMUSCULAR | Status: DC | PRN

## 2022-01-31 MED ORDER — SODIUM CHLORIDE 0.9% IV SOLUTION: Freq: Once | INTRAVENOUS | Status: AC

## 2022-01-31 MED ORDER — DARBEPOETIN ALFA 100 MCG/0.5ML IJ SOSY
100.0000 ug | PREFILLED_SYRINGE | Freq: Once | INTRAMUSCULAR | Status: AC
  Administered 2022-01-31: 100 ug via SUBCUTANEOUS
  Filled 2022-01-31: qty 0.5

## 2022-01-31 MED ORDER — HEPARIN SODIUM (PORCINE) 1000 UNIT/ML DIALYSIS
40.0000 [IU]/kg | INTRAMUSCULAR | Status: DC | PRN
  Filled 2022-01-31: qty 5

## 2022-01-31 MED ORDER — CEFAZOLIN SODIUM-DEXTROSE 1-4 GM/50ML-% IV SOLN
1.0000 g | INTRAVENOUS | Status: DC
  Filled 2022-01-31: qty 50

## 2022-01-31 NOTE — Progress Notes (Signed)
Subjective: This a.m. drowsy but awoken to voice, no complaints despite left wrist and right knee surgical procedure yesterday also said tolerated dialysis last evening.  Noted she pulled out her left wrist drain overnight and remove splint but was unable to say why when asked by hand MD ? ?Objective ?Vital signs in last 24 hours: ?Vitals:  ? 01/31/22 0222 01/31/22 1478 01/31/22 0527 01/31/22 2956  ?BP: (!) 148/75 (!) 161/74 (!) 131/59 (!) 151/82  ?Pulse: 82 (!) 115 (!) 114 (!) 101  ?Resp: (!) 23 20 20 20   ?Temp: 98.4 ?F (36.9 ?C) (!) 100.9 ?F (38.3 ?C) 99.6 ?F (37.6 ?C) 99.2 ?F (37.3 ?C)  ?TempSrc:  Oral Oral   ?SpO2: 99% 92% 100% 99%  ?Weight:      ?Height:      ? ?Weight change: -0.037 kg ? ?Physical Exam: ?General: Young AAF, lethargic appearing but arousable to voice NAD ?Heart: RRR no MRG ?Lungs: CTA nonlabored breathing ?Abdomen: Obese, NABS, S, NT ND ?Extremities: There is bipedal edema, right knee dressing dry clear and left wrist dressing dry clear ?Dialysis Access: L A AVF+ bruit ? ? ?Dialysis Order s: Center: [facility], TTS, 3.5 hs,BFR 350 / Auto flow DFR ?2K, 2.5 CA ?Heparin 5000 units ?Mircera 100 mcg last given 11/20/01 ?Calcitriol 1.75 mcg p.o. q. HD  ?Venofer 50 MG q. weekly  ?LUA AVF ?  ?Problem/Plan: ?Altered mental status with severe uremia(BUN-158 creatinine 30) 2/2 missed OP HD ,last HD 01/08/2022 , HD 3/02, 3/03 creatinine improved to 10, BUN 51 this a.m., also sepsis may be multifactorial of AMS and baseline personality disorders ?ESRD -chronic HD TTS with OP HD noncompliance /a.m. labs as above improved, hold HD today next on Monday and then try to get back on outpatient schedule  ?Hypertension/volume  -tolerated 1 L UF 3/02 and 1.5 L yesterday ,BP improved with UF and meds ?Right knee pain //infection /status post knee irrigation and debridement 3/04 elevated WBC, Ortho consulting ,on IV vancomycin /Maxipime, noted ID consult recommended ?Left wrist questionable infection status post I&D by hand  surgeon 3/03. Hand Surg.following ?Anemia  -a.m. Hgb 6.6 < 7.4 , will transfuse 1 unit today also, give ESA , currently avoid iron with febrile infection picture ?Metabolic bone disease -a.m. lab corrected calcium over 10, phos. 10.9 to 8.4 continue binders  hold vitamin D until  CA improved ?Nutrition -albumin 2.2 protein supplements with renal failure diet renal vitamin when taking p.o.'s ?History of borderline personality disorder, major depressive order, generalized anxiety/last reported homeless= plan per admit team ? ? ?Ernest Haber, PA-C ?[facility] ?Beeper [phone] ?01/31/2022,11:00 AM ? LOS: 2 days  ? ?Labs: ?Basic Metabolic Panel: ?Recent Labs  ?Lab 01/30/22 ?0321 01/31/22 ?0204 01/31/22 ?0416  ?NA 133* 132* 136  ?K 3.8 4.6 3.2*  ?CL 100 99 97*  ?CO2 18* 14* 22  ?GLUCOSE 127* 80 90  ?BUN 104* 117* 51*  ?CREATININE 20.31* 20.20* 10.24*  ?CALCIUM 8.3* 8.7* 8.6*  ?PHOS 8.4* 10.9* 5.3*  ? ?Liver Function Tests: ?Recent Labs  ?Lab 01/30/22 ?0321 01/31/22 ?0204 01/31/22 ?0416  ?ALBUMIN 2.4* 2.2* 2.2*  ? ?No results for input(s): LIPASE, AMYLASE in the last 168 hours. ?No results for input(s): AMMONIA in the last 168 hours. ?CBC: ?Recent Labs  ?Lab 01/28/22 ?2348 01/29/22 ?1315 01/30/22 ?0321 01/31/22 ?0244  ?WBC 13.3* 15.7* 13.7* 15.2*  ?NEUTROABS 11.9*  --   --   --   ?HGB 7.4* 7.6* 7.4* 6.6*  ?HCT 22.6* 22.6* 21.5* 19.7*  ?MCV  92.2 91.5 87.8 90.8  ?PLT 174 155 145* 163  ? ?Cardiac Enzymes: ?No results for input(s): CKTOTAL, CKMB, CKMBINDEX, TROPONINI in the last 168 hours. ?CBG: ?No results for input(s): GLUCAP in the last 168 hours. ? ?Studies/Results: ?DG Wrist 2 Views Left ? ?Result Date: 01/30/2022 ?CLINICAL DATA:  Left wrist pain EXAM: LEFT WRIST - 2 VIEW COMPARISON:  None. FINDINGS: There is no evidence of fracture or dislocation. Carpus is normally aligned. There is no evidence of arthropathy or other focal bone abnormality. Soft tissue edema about the wrist. IMPRESSION: 1. No fracture or  dislocation of the left wrist. The carpus is normally aligned. 2.  Soft tissue edema about the wrist. Electronically Signed   By: Delanna Ahmadi M.D.   On: 01/30/2022 14:48   ?Medications: ? sodium chloride    ? sodium chloride    ? sodium chloride 10 mL/hr at 01/30/22 1700  ? ceFEPime (MAXIPIME) IV    ? vancomycin    ? ? acetaminophen  1,000 mg Oral Q8H  ? ARIPiprazole  7.5 mg Oral Daily  ? chlorhexidine  15 mL Mouth/Throat NOW  ? Chlorhexidine Gluconate Cloth  6 each Topical Q0600  ? darbepoetin (ARANESP) injection - DIALYSIS  100 mcg Intravenous Q Sat-HD  ? feeding supplement (NEPRO CARB STEADY)  237 mL Oral BID BM  ? heparin injection (subcutaneous)  5,000 Units Subcutaneous Q8H  ? losartan  50 mg Oral Daily  ? multivitamin  1 tablet Oral Daily  ? pantoprazole  40 mg Oral Daily  ? ? ? ? ?

## 2022-01-31 NOTE — Progress Notes (Addendum)
Pt returned from dialysis, pt. C/o pain in left arm. Returned with medication and pt. had pulled off brace, part of surgical dressing and JP drain was loose in the bed. MD notified.  Administered PRN pain medication approximately 30 minutes early per MD permission. Critical lab reported to MD, orders obtained. MD notified of [phone] vital signs.  ?
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
## 2022-01-31 NOTE — Progress Notes (Signed)
PT Cancellation Note ? ?Patient Details ?Name: Natasha Long ?MRN: [id number] ?DOB: 09-03-1996 ? ? ?Cancelled Treatment:    Reason Eval/Treat Not Completed: Pain limiting ability to participate;Other (comment) ? ?Patient appeared to be sound asleep and not responding to various attempts to awaken her. As soon as I started to perform PROM on RLE, pt began screaming as loud as she could "just stop, just stop, just stop..." Explained benefits of activity and detrimental effects of staying in bed and not moving her RLE.  Encouraged AROM multiple times with no result. Majority of session pt with eyes closed and not responding except to repeatedly tell me to stop. No progress was made. Spoke with RN and left a Lt platform RW in her room should she decide to try to get up later today or tonight. PT will reattempt 02/01/22 ? ? ?Arby Barrette, PT ?Acute Rehabilitation Services  ?Pager [phone] ?Office [phone] ? ? ?[name] ?01/31/2022, 2:39 PM ?

## 2022-01-31 NOTE — Progress Notes (Signed)
OT Cancellation Note ? ?Patient Details ?Name: Natasha Long ?MRN: [id number] ?DOB: [birth year] ? ? ?Cancelled Treatment:    Reason Eval/Treat Not Completed: Fatigue/lethargy limiting ability to participate;Other (comment) Pt lethargic on entry, mumbled occasionally to questions and yelled "Ouch, stop" when OT attempted to range digits/wrist though would not attempt to move joints on her own. Kept eyes closed entire time. Coordinated with RN for pain premedication prior to attempting. Will follow-up when pt more participatory and as schedule permits.  ? ?Layla Maw ?01/31/2022, 9:07 AM ?

## 2022-01-31 NOTE — Progress Notes (Signed)
Pharmacy Antibiotic Note ? ?Natasha Long is a 26 y.o. female admitted on 01/28/2022 with  knee pain following traumatic fall with concern for septic joint .  Pharmacy has been consulted for vancomycin and cefepime dosing. ? ?Pt has been on vanc/cefepime since 3/3. She got vanc load then HD yesterday. Plan is to change cefepime to cefazolin per ID for now. HD is delayed until Monday due to drowsiness per renal. We will give another dose of vanc today. Then start daily cefazolin for now until HD schedule is back to normal. ? ?Plan: ?Vanc 1g IV x1 ?Cefazolin 1g IV q24 ?F/u next HD ? ? ?Height: 5\' 8"  (172.7 cm) ?Weight:  (UNABLE TO OBTAIN.) ?IBW/kg (Calculated) : 63.9 ? ?Temp (24hrs), Avg:99.4 ?F (37.4 ?C), Min:98.4 ?F (36.9 ?C), Max:100.9 ?F (38.3 ?C) ? ?Recent Labs  ?Lab 01/28/22 ?2348 01/29/22 ?1315 01/30/22 ?0321 01/31/22 ?0233 01/31/22 ?4356 01/31/22 ?0416 01/31/22 ?1113  ?WBC 13.3* 15.7* 13.7*  --  15.2*  --  13.2*  ?CREATININE 31.38* 30.45* 20.31* 20.20*  --  10.24*  --   ? ?  ?Estimated Creatinine Clearance: 10.7 mL/min (A) (by C-G formula based on SCr of 10.24 mg/dL (H)).   ? ?Allergies  ?Allergen Reactions  ? Prozac [Fluoxetine Hcl] Other (See Comments)  ?  Panic attack   ? Wellbutrin [Bupropion] Other (See Comments)  ?  Panic attack  ? Prednisone Other (See Comments)  ?  Pt states that this med caused pancreatitis.   ? ?3/3 Bcx: ngtd ?3/3 R Knee synovial fluid: GPC ?3/3 L  wrist synovial fluid: GPC in pairs ?  ?Cefepime 3/3 >>3/3 ?Cefazolin 3/4>> ?Vanco 3/3 >> ? ?Onnie Boer, PharmD, BCIDP, AAHIVP, CPP ?Infectious Disease Pharmacist ?01/31/2022 12:16 PM ? ? ?

## 2022-01-31 NOTE — Progress Notes (Signed)
? ?  Subjective: ? ?Patient reports pain as moderate in right knee. Elevated WBC count this am. Remains afebrile. ? ?Objective:  ? ?VITALS:   ?Vitals:  ? 01/31/22 0207 01/31/22 0222 01/31/22 1660 01/31/22 6301  ?BP: (!) 154/75 (!) 148/75 (!) 161/74 (!) 131/59  ?Pulse: (!) 25 82 (!) 115 (!) 114  ?Resp: 20 (!) 23 20 20   ?Temp:  98.4 ?F (36.9 ?C) (!) 100.9 ?F (38.3 ?C) 99.6 ?F (37.6 ?C)  ?TempSrc:   Oral Oral  ?SpO2: (!) 53% 99% 92% 100%  ?Weight:      ?Height:      ? ? ?Right leg with dressing in place. Able to flex to 50 degrees minimal pain. SILT all distributions right foot. 2+ DP pulse ? ?Lab Results  ?Component Value Date  ? WBC 15.2 (H) 01/31/2022  ? HGB 6.6 (LL) 01/31/2022  ? HCT 19.7 (L) 01/31/2022  ? MCV 90.8 01/31/2022  ? PLT 163 01/31/2022  ? ? ? ?Assessment/Plan: ? ?1 Day Post-Op right knee irrigation and debridement ? ?- Expected postop acute blood loss anemia - will monitor for symptoms ?- Patient to work with PT/OT to optimize mobilization safely ?- DVT ppx - SCDs, ambulation, Heparin ?- WBAT operative extremity ?- Will f/u knee aspirate cultures ?- Recommend ID consult  ?- Discharge planning pending CM, appreciate coordination  ? ? ?Vanetta Mulders ?01/31/2022, 7:43 AM ? ?

## 2022-01-31 NOTE — Progress Notes (Addendum)
? ?Subjective:  ? ?O/N: Spiked a low-grade fever of 100.66F. Per RN, patient pulled apart left wrist splint and removed JP drain. Hgb also found to be 6.6 this AM, given 1u pRBCs. ? ?Evaluated patient at bedside. Remains drowsy/somnolent. Intermittently awakens to follow some simple commands but not participating in conversation. ? ? ?Objective: ? ?Vital signs in last 24 hours: ?Vitals:  ? 01/31/22 0222 01/31/22 6767 01/31/22 0527 01/31/22 2094  ?BP: (!) 148/75 (!) 161/74 (!) 131/59 (!) 151/82  ?Pulse: 82 (!) 115 (!) 114 (!) 101  ?Resp: (!) 23 20 20 20   ?Temp: 98.4 ?F (36.9 ?C) (!) 100.9 ?F (38.3 ?C) 99.6 ?F (37.6 ?C) 99.2 ?F (37.3 ?C)  ?TempSrc:  Oral Oral   ?SpO2: 99% 92% 100% 99%  ?Weight:      ?Height:      ? ?Physical Exam ?General: [place] female, lying in bed, somnolent, intermittently awakens to follow simple commands, but not participatory with conversation. ?HENT: Splitting of middle of lower lip. ?CV: Mild tachycardia, regular rhythm. ?Pulm: CTABL, no adventitious sounds noted. ?Abdomen: soft, nondistended, nontender to palpation. Normoactive bowel sounds. ?MSK: RLE externally rotated. R knee wrapped in bandage, tender to palpation. L wrist with remnants of dressings, tender to passive ROM. Able to wiggle bilateral toes to command. Passive ROM in bilateral ankles intact without tenderness, swelling, or warmth. L knee warm but no erythema or swelling noted and passive ROM intact without tenderness. Bilateral elbows equal in temperature with passive ROM intact without tenderness or swelling. ?Neuro: somnolent, uncooperative with examination. ?Psychiatric: irritable mood with manipulation of extremities. ? ? ?Assessment/Plan: ? ?Principal Problem: ?  Knee pain, acute ?Active Problems: ?  ESRD on dialysis [facility]) ?  Hyperphosphatemia ?  Effusion of right knee ?  Hyperkalemia ?  Arthritis, septic ([place]) ?  Dialysis patient, noncompliant ([place]) ? ?Natasha Long is a 26 year old female with a history of nephrotic syndrome  on hemodialysis who has been noncompliant with sessions for the past 2.5 weeks, borderline personality disorder, and recent homelessness who presented with traumatic knee injury after falling down a flight of stairs, found to have multifocal septic arthritis. ? ?Acute traumatic knee injury ?Multifocal septic arthritis ?Low grade fever overnight, mildly worsened leukocytosis on labs this morning. S/p I&D of R knee and L wrist by ortho. R knee fluid preliminary cultures with no organisms seen but cell count with 70,962 WBCs, neutrophilic predominance. L wrist prelim cultures showing abundant GPCs in pairs, awaiting finalized cultures and sensitivities. ID consulted, initially on vanc/cefepime but will transition to vanc/cefazolin per ID recs.  ?-appreciate ortho and ID assistance ?-IV vanc and cefazolin, narrow pending cultures/sensitivities ?-tylenol q8, dilaudid 0.5mg  q4 prn for pain control ?-trend daily cbc ? ?ESRD with HD noncompliance ?Hyperphosphatemia 2/2 missed HD ?Hyperkalemia 2/2 missed HD, resolved  ?Patient reports history of nephrotic syndrome.  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  Patient reports noncompliance with hemodialysis due to being unhomed. Multiple presentations at multiple ED's after missing HD. ?-- appreciate nephrology assistance ?- Received dialysis last night, next session Monday ?- Trend renal function panel daily ? ?Anemia of ESRD ?Acute blood loss anemia ?Hgb 6.6 overnight, likely secondary to postop acute blood loss. Asymptomatic, now s/p 1u pRBCs. ?-- f/u post-transfusion CBC ?- Trend daily CBC ?- Transfuse Hgb less than 7.0 ?  ?History of borderline personality disorder ?Major depressive disorder ?Generalized anxiety disorder ?Follows at [facility] for care.  Home medications: Abilify 10 mg (patient-reported dose, 7.5 documented), gabapentin 300 mg 3 times  daily, and hydroxyzine 25 mg 3 times daily as needed. ?- Continue home Abilify 7.5 mg and hydroxyzine  25 mg PRN anxiety ?- TOC consult placed for unhomed status ?  ?Hypertension ?Patient non-adherent with home medications. Improved with HD and home losartan.  ?-- Continue home Losartan 50 mg daily ?-- Monitor daily vitals ? ?Prior to Admission Living Arrangement: Unhomed, awaiting housing at [employer]. ?Anticipated Discharge Location: Pending ?Barriers to Discharge: Unhomed status ?Dispo: Anticipated discharge in approximately 2-3 day(s).  ? ?Virl Axe, MD ?01/31/2022, 8:42 AM ?Pager: [phone] ?After 5pm on weekdays and 1pm on weekends: On Call pager [phone]  ?

## 2022-01-31 NOTE — Evaluation (Addendum)
Occupational Therapy Evaluation Patient Details Name: Natasha Long MRN: [id number] DOB: [birth year] Today's Date: 01/31/2022   History of Present Illness 26 y.o. female presented to ED 01/28/22 for pain in right knee which occurred after she twisted it when she stumbled, after twisting her left ankle.  Right knee x-ray, CT right knee-degenerative joint present, knee effusion present, no fracture present. Needs dialysis after not presenting for dialysis x 2 weeks. 01/30/22 Rt knee aspiration and injection; 01/30/22 due to septic joint underwent Rt knee extensive synovectomy and later meniscal debridement; also developed left wrist pain and had arthrotomy for irrigation due to infection;    PMH nephrotic syndrome now on HD TThS, HTN, diastolic heart failure, borderline personality disorder, generalized anxiety, major depressive disorder, and recent homelessness   Clinical Impression   On entry, pt and visitor at bedside extremely lethargic though wide awake (and attempting to get OOB) 20 min ago per RN. Pt able to answer questions appropriately and follow directions though does so sparingly with self-limiting presentation. Pt resistant to staff assisting with L wrist bandage changes and ROM. Encouraged pt to assist with L UE movement to better control her pain though pt resistant to this as well. Ultimately, pt noncompliant when attempted to educate on L UE exercises (squeeze ball provided), asking for dilaudid. Reinforced importance of therapy participation to maximize independence, ROM of L UE to prevent contractures and engaging with staff to ensure optimal care. Anticipate pt will need +2 assist for OOB attempts and require SNF at DC. Will continue to follow acutely to progress independence within pt tolerance.     Recommendations for follow up therapy are one component of a multi-disciplinary discharge planning process, led by the attending physician.  Recommendations may be updated based on patient  status, additional functional criteria and insurance authorization.   Follow Up Recommendations  Skilled nursing-short term rehab (<3 hours/day)    Assistance Recommended at Discharge Frequent or constant Supervision/Assistance  Patient can return home with the following Two people to help with walking and/or transfers;A lot of help with bathing/dressing/bathroom    Functional Status Assessment  Patient has had a recent decline in their functional status and demonstrates the ability to make significant improvements in function in a reasonable and predictable amount of time.  Equipment Recommendations  Other (comment) (TBD pending progress and pt participation; likely L platform walker)    Recommendations for Other Services       Precautions / Restrictions Precautions Precautions: Fall;Other (comment) Precaution Comments: L wrist splint for comfort; L wrist/digit ROM ok Restrictions Weight Bearing Restrictions: Yes RLE Weight Bearing: Weight bearing as tolerated Other Position/Activity Restrictions: assummed WB through elbow for L UE      Mobility Bed Mobility               General bed mobility comments: refused    Transfers                          Balance                                           ADL either performed or assessed with clinical judgement   ADL Overall ADL's : Needs assistance/impaired Eating/Feeding: Minimal assistance;Bed level   Grooming: Maximal assistance;Bed level   Upper Body Bathing: Maximal assistance;Bed level   Lower Body Bathing: Total assistance;Bed level  Upper Body Dressing : Maximal assistance;Bed level   Lower Body Dressing: Total assistance;Bed level       Toileting- Clothing Manipulation and Hygiene: Total assistance;Bed level         General ADL Comments: Difficult to fully assess due to pt self limiting behaviors, pain in L wrist and R knee.     Vision Ability to See in Adequate  Light: 0 Adequate Patient Visual Report: No change from baseline Vision Assessment?: No apparent visual deficits     Perception     Praxis      Pertinent Vitals/Pain Pain Assessment Pain Assessment: Faces Faces Pain Scale: Hurts even more Pain Location: L wrist with movement Pain Descriptors / Indicators: Grimacing, Guarding Pain Intervention(s): Premedicated before session, Monitored during session, Limited activity within patient's tolerance, RN gave pain meds during session, Repositioned, Patient requesting pain meds-RN notified     Hand Dominance Right   Extremity/Trunk Assessment Upper Extremity Assessment Upper Extremity Assessment: LUE deficits/detail LUE Deficits / Details: can wiggle digits though will not do when asked. tolerates minimal ROM of wrist. Provided squeeze ball, pt would not demonstrate its use despite RN/OT prompting. Attempted to show self ROM exercises though pt kept eyes closed and would not open to attend to therapist when asked. Educated on importance of elevation for edema, ROM to prevent contractures. assume elbow and shoulder ROM WFL LUE Coordination: decreased fine motor;decreased gross motor   Lower Extremity Assessment Lower Extremity Assessment: Defer to PT evaluation   Cervical / Trunk Assessment Cervical / Trunk Assessment: Normal   Communication Communication Communication: Other (comment) (intentional decreased responsiveness)   Cognition Arousal/Alertness: Awake/alert, Lethargic Behavior During Therapy: Flat affect Overall Cognitive Status: No family/caregiver present to determine baseline cognitive functioning                                 General Comments: Answers some questions but appears to intentionally ignore staff inquires. capable of following directions but resistant to do so. self limiting behaviors     General Comments  "boyfriend" at bedside Marjory Lies?) though not same S.O. name listed in chart. This visitor  was passed out in recliner chair, did not awaken easily    Exercises     Shoulder Instructions      Home Living Family/patient expects to be discharged to:: Shelter/Homeless                                        Prior Functioning/Environment Prior Level of Function : Independent/Modified Independent;Patient poor historian/Family not available                        OT Problem List: Decreased strength;Decreased range of motion;Decreased activity tolerance;Decreased coordination;Pain;Impaired UE functional use;Decreased knowledge of precautions;Decreased knowledge of use of DME or AE;Decreased cognition      OT Treatment/Interventions: Self-care/ADL training;Therapeutic exercise;Energy conservation;DME and/or AE instruction;Therapeutic activities;Patient/family education;Balance training;Manual therapy    OT Goals(Current goals can be found in the care plan section) Acute Rehab OT Goals Patient Stated Goal: get diluadid OT Goal Formulation: With patient Time For Goal Achievement: 02/14/22 Potential to Achieve Goals: Fair  OT Frequency: Min 2X/week    Co-evaluation              AM-PAC OT "6 Clicks" Daily Activity     Outcome Measure  Help from another person eating meals?: A Little Help from another person taking care of personal grooming?: A Lot Help from another person toileting, which includes using toliet, bedpan, or urinal?: Total Help from another person bathing (including washing, rinsing, drying)?: A Lot Help from another person to put on and taking off regular upper body clothing?: A Lot Help from another person to put on and taking off regular lower body clothing?: Total 6 Click Score: 11   End of Session Nurse Communication: Mobility status;Other (comment) (RN present, assisting in attempting to get pt to participate)  Activity Tolerance: Patient limited by pain;Other (comment) (self limiting) Patient left: in bed;with bed alarm  set;with call bell/phone within reach;with family/visitor present  OT Visit Diagnosis: Unsteadiness on feet (R26.81);Other abnormalities of gait and mobility (R26.89);Muscle weakness (generalized) (M62.81);Pain Pain - Right/Left: Left Pain - part of body: Arm;Hand                Time: 2979-8921 OT Time Calculation (min): 18 min Charges:  OT General Charges $OT Visit: 1 Visit OT Evaluation $OT Eval Moderate Complexity: 1 Mod  Malachy Chamber, OTR/L Acute Rehab Services Office: [phone]   Layla Maw 01/31/2022, 1:58 PM

## 2022-01-31 NOTE — Progress Notes (Addendum)
Subjective: ?  ?Patient drowsy, but arousable.  Reporting pain in left wrist.  Patient pulled out drain overnight and removed her splint.    Unable to articulate why she did that. ?  ?Objective:  ?  ?VITALS:   ?      ?Vitals:  ?  01/31/22 0207 01/31/22 0222 01/31/22 8786 01/31/22 7672  ?BP: (!) 154/75 (!) 148/75 (!) 161/74 (!) 131/59  ?Pulse: (!) 25 82 (!) 115 (!) 114  ?Resp: 20 (!) 23 20 20   ?Temp:   98.4 ?F (36.9 ?C) (!) 100.9 ?F (38.3 ?C) 99.6 ?F (37.6 ?C)  ?TempSrc:     Oral Oral  ?SpO2: (!) 53% 99% 92% 100%  ?Weight:          ?Height:          ?  ?  ?Remnants of the bulky dressing remain.  Able to flex/ext digits.  PROM of wrist painful still ?  ?Recent Labs  ?     ?Lab Results  ?Component Value Date  ?  WBC 15.2 (H) 01/31/2022  ?  HGB 6.6 (LL) 01/31/2022  ?  HCT 19.7 (L) 01/31/2022  ?  MCV 90.8 01/31/2022  ?  PLT 163 01/31/2022  ?  ?  ?  ?  ?Assessment/Plan: ?  ?1 Day Post-Op left wrist I&D--no intraop cultures since several sets of cultures were already in progress ? ?- Patient to work with PT/OT to optimize mobilization safely and with OT specifically for digital/wrist ROM to prevent stiffness ?  Left sided platform device may be helpful if needs to use walker or crutches for ambulation ?- Patient already pulled drain ?- Splint is optional for comfort ?- Begin wrist light dry dressing changes ?- Recommend ID consult  ?- Hopefully will slowly turn the corner with regard to wrist pain/movement etc.   ? ?Micheline Rough, MD ?Hand Surgery ? ?  ?  ? ?

## 2022-02-01 DIAGNOSIS — M0029 Other streptococcal polyarthritis: Secondary | ICD-10-CM

## 2022-02-01 DIAGNOSIS — M009 Pyogenic arthritis, unspecified: Secondary | ICD-10-CM | POA: Diagnosis present

## 2022-02-01 DIAGNOSIS — B954 Other streptococcus as the cause of diseases classified elsewhere: Secondary | ICD-10-CM | POA: Diagnosis not present

## 2022-02-01 DIAGNOSIS — Z992 Dependence on renal dialysis: Secondary | ICD-10-CM | POA: Diagnosis not present

## 2022-02-01 DIAGNOSIS — N186 End stage renal disease: Secondary | ICD-10-CM | POA: Diagnosis not present

## 2022-02-01 DIAGNOSIS — M25532 Pain in left wrist: Secondary | ICD-10-CM

## 2022-02-01 NOTE — Progress Notes (Signed)
Patient stable.  Just received pain medicine. ?Right knee does not feel like it has much effusion.  Ankle dorsiflexion intact.  Foot is perfused and sensate. ?Continue with IV antibiotics per medical team management. ?She may take a while to mobilize based on pain control. ?

## 2022-02-01 NOTE — Progress Notes (Signed)
Subjective: ?  ?Patient drowsy, but arousable. C/o knee and wrist pain.  Non-cooperative with exam.   ?  ?Objective:  ?  ?Tmax 99.2 ? ?L UE dressing present. As I attempt to examine her, she yells and withdraws hand.  I was able to observe the digits flex. Degree of irritibility of the wrist difficult to assess/compare to prior exams 2/2 uncooperative nature of exam ?  ?3-4 WBC 13.2  ?  ?  ?Assessment/Plan: ?  ?01-30-22: left wrist I&D--no intraop cultures since several sets of cultures were already in progress ? ?- Patient to work with PT/OT to optimize mobilization safely and with OT specifically for digital/wrist ROM to prevent stiffness ?  Left sided platform device may be helpful if needs to use walker or crutches for ambulation ? ?- Patient already pulled drain ?- Splint is optional for comfort ?- Continue wrist light dry dressing changes ? ?- Hopefully will slowly turn the corner with regard to wrist pain/movement etc.  Right now, it is difficult to determine if treatment is effectively succeeding based on exam, so will likely have to rely on temps/lab parameters.  It remains quite possible that the septic arthritis could significantly affect the health of the articular cartilage before the infection resolves.   ? ?Micheline Rough, MD ?Hand Surgery ? ?  ?  ? ?

## 2022-02-01 NOTE — Progress Notes (Addendum)
? ?Subjective:  ? ?O/N: No acute overnight events.  Morning labs refused by patient. ? ?Evaluated patient at bedside, remains drowsy although arousable to voice.  Follows some simple commands but not fully participating in conversation. ? ?When aroused, asking what she can do to make her feel better.  Discussed that it will take some time for antibiotics to suppress infection. ? ? ?Objective: ? ?Vital signs in last 24 hours: ?Vitals:  ? 01/31/22 1631 01/31/22 2003 02/01/22 0541 02/01/22 0948  ?BP: (!) 149/84 (!) 147/82 (!) 151/77 (!) 141/78  ?Pulse: 94 99 97 96  ?Resp: 18 19 18 16   ?Temp: 98.5 ?F (36.9 ?C) 99.2 ?F (37.3 ?C) 98.4 ?F (36.9 ?C) 98.2 ?F (36.8 ?C)  ?TempSrc: Oral Oral  Oral  ?SpO2: 100% 100% 100% 97%  ?Weight:      ?Height:      ? ?Physical Exam ?General: Young female, lying in bed, drowsy but arousable, NAD. ?HENT: Splitting of middle of lower lip, healing. ?CV: Normal rate and regular rhythm. ?Pulm: Normal work of breathing on room air, no respiratory distress noted. ?MSK: RLE externally rotated.  Right knee wrapped in bandage, tender to palpation.  Left wrist with dressings in place.  Able to wiggle bilateral toes to command but unable to perform full active ROM in joints due to lack of participation.  Passive ROM intact in bilateral ankles and elbows along with left knee and right wrist and without tenderness, swelling, warmth. ?Neuro: Drowsy, follows some simple commands, however uncooperative with full examination. ? ?Assessment/Plan: ? ?Principal Problem: ?  Knee pain, acute ?Active Problems: ?  ESRD on dialysis [facility]) ?  Hyperphosphatemia ?  Effusion of right knee ?  Hyperkalemia ?  Arthritis, septic ([place]) ?  Dialysis patient, noncompliant ([place]) ? ?Natasha Long is a 26 year old female with a history of nephrotic syndrome on hemodialysis who has been noncompliant with sessions for the past 2.5 weeks, borderline personality disorder, and recent homelessness who presented with traumatic knee injury  after falling down a flight of stairs, found to have multifocal septic arthritis. ? ?Acute traumatic knee injury ?Multifocal septic arthritis ?Afebrile overnight.  Morning labs refused by patient..  She is postop day 2 after I&D of right knee and left wrist by Ortho, appreciate assistance.  Right knee fluid prelim cultures with no organisms seen but cell count with 52,500 WBCs with neutrophilic predominance.  Left wrist prelim culture showing group G Streptococcus, awaiting sensitivities.  Discussed with pharmacy, cefazolin should provide adequate coverage.  ID following. ?-Appreciate Ortho, ID, and pharmacy assistance ?-IV cefazolin, awaiting sensitivities ?-tylenol q8, dilaudid 0.5mg  q4 prn for pain control ?-trend daily cbc ? ?ESRD with HD noncompliance ?Hyperphosphatemia 2/2 missed HD ?Hyperkalemia 2/2 missed HD, resolved  ?Patient reports history of nephrotic syndrome.  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  Patient reports noncompliance with hemodialysis due to being unhomed. Multiple presentations at multiple ED's after missing HD. ?-- appreciate nephrology assistance ?-Received dialysis 2 days ago, next session tomorrow ?- Trend renal function panel daily ? ?Anemia of ESRD ?Acute blood loss anemia ?Hgb 6.6 yesterday, likely secondary to postop acute blood loss, improved to 7.4 after 1 unit PRBCs. Remains asymptomatic.  Morning labs refused by patient today, will check again tomorrow. ?- Trend daily CBC ?- Transfuse Hgb less than 7.0 ?  ?History of borderline personality disorder ?Major depressive disorder ?Generalized anxiety disorder ?Follows at [facility] for care.  Home medications: Abilify 10 mg (patient-reported dose, 7.5 documented), gabapentin 300 mg 3 times  daily, and hydroxyzine 25 mg 3 times daily as needed. ?- Continue home Abilify 7.5 mg and hydroxyzine 25 mg PRN anxiety ?- TOC consult placed for unhomed status ?  ?Hypertension ?Patient non-adherent with home medications.  Improved with HD and home losartan.  ?-- Continue home Losartan 50 mg daily ?-- Monitor daily vitals ? ?Prior to Admission Living Arrangement: Unhomed, awaiting housing at [employer]. ?Anticipated Discharge Location: Pending ?Barriers to Discharge: Unhomed status ?Dispo: Anticipated discharge in approximately 2-3 day(s).  ? ?Virl Axe, MD ?02/01/2022, 11:37 AM ?Pager: [phone] ?After 5pm on weekdays and 1pm on weekends: On Call pager [phone]  ?

## 2022-02-01 NOTE — Progress Notes (Signed)
Subjective:   Natasha Long is much better than when I saw her yesterday but Natasha Long still saying that her pain is not well controlled and that the "Dilaudid is not working well enough"     Antibiotics:  Anti-infectives (From admission, onward)    Start     Dose/Rate Route Frequency Ordered Stop   01/31/22 2200  ceFEPIme (MAXIPIME) 2 g in sodium chloride 0.9 % 100 mL IVPB  Status:  Discontinued        2 g 200 mL/hr over 30 Minutes Intravenous Once per day on Tue Thu Sat 01/30/22 0605 01/31/22 1206   01/31/22 1400  ceFAZolin (ANCEF) IVPB 1 g/50 mL premix  Status:  Discontinued        1 g 100 mL/hr over 30 Minutes Intravenous Every 24 hours 01/31/22 1206 01/31/22 1217   01/31/22 1400  ceFAZolin (ANCEF) IVPB 1 g/50 mL premix        1 g 100 mL/hr over 30 Minutes Intravenous Daily-1800 01/31/22 1217     01/31/22 1300  vancomycin (VANCOCIN) IVPB 1000 mg/200 mL premix        1,000 mg 200 mL/hr over 60 Minutes Intravenous  Once 01/31/22 1211 01/31/22 1442   01/31/22 1200  vancomycin (VANCOCIN) IVPB 1000 mg/200 mL premix  Status:  Discontinued        1,000 mg 200 mL/hr over 60 Minutes Intravenous Every T-Th-Sa (Hemodialysis) 01/30/22 0605 01/31/22 1206   01/31/22 0600  ceFAZolin (ANCEF) IVPB 2g/100 mL premix  Status:  Discontinued        2 g 200 mL/hr over 30 Minutes Intravenous On call to O.R. 01/30/22 1207 01/30/22 1827   01/30/22 0330  vancomycin (VANCOREADY) IVPB 2000 mg/400 mL        2,000 mg 200 mL/hr over 120 Minutes Intravenous  Once 01/30/22 0230 01/30/22 0637   01/30/22 0330  ceFEPIme (MAXIPIME) 2 g in sodium chloride 0.9 % 100 mL IVPB        2 g 200 mL/hr over 30 Minutes Intravenous  Once 01/30/22 0230 01/30/22 0434       Medications: Scheduled Meds:  acetaminophen  1,000 mg Oral Q8H   ARIPiprazole  7.5 mg Oral Daily   Chlorhexidine Gluconate Cloth  6 each Topical Q0600   [START ON 02/14/2022] darbepoetin (ARANESP) injection - DIALYSIS  100 mcg Intravenous Q Sat-HD    feeding supplement (NEPRO CARB STEADY)  237 mL Oral BID BM   heparin injection (subcutaneous)  5,000 Units Subcutaneous Q8H   losartan  50 mg Oral Daily   multivitamin  1 tablet Oral Daily   pantoprazole  40 mg Oral Daily   Continuous Infusions:  sodium chloride     sodium chloride     sodium chloride 10 mL/hr at 01/30/22 1700    ceFAZolin (ANCEF) IV 1 g (01/31/22 1634)   PRN Meds:.sodium chloride, sodium chloride, heparin, HYDROmorphone (DILAUDID) injection, hydrOXYzine, lidocaine (PF), lidocaine-prilocaine, pentafluoroprop-tetrafluoroeth    Objective: Weight change:   Intake/Output Summary (Last 24 hours) at 02/01/2022 1541 Last data filed at 02/01/2022 0900 Gross per 24 hour  Intake 120 ml  Output 400 ml  Net -280 ml   Blood pressure (!) 141/78, pulse 96, temperature 98.2 F (36.8 C), temperature source Oral, resp. rate 16, height 5\' 8"  (1.727 m), weight 108.9 kg, last menstrual period 01/28/2022, SpO2 97 %. Temp:  [98.2 F (36.8 C)-99.2 F (37.3 C)] 98.2 F (36.8 C) (03/05 0948) Pulse Rate:  [94-99] 96 (03/05 0948)  Resp:  [16-19] 16 (03/05 0948) BP: (141-151)/(77-84) 141/78 (03/05 0948) SpO2:  [97 %-100 %] 97 % (03/05 0948)  Physical Exam: Physical Exam Constitutional:      Appearance: Natasha Long is obese.  Eyes:     General:        Right eye: No discharge.        Left eye: No discharge.     Extraocular Movements: Extraocular movements intact.  Pulmonary:     Effort: No respiratory distress.     Breath sounds: No wheezing.  Abdominal:     General: There is no distension.     Tenderness: There is no abdominal tenderness.  Skin:    General: Skin is warm and dry.  Neurological:     General: No focal deficit present.     Mental Status: Natasha Long is alert and oriented to person, place, and time.  Psychiatric:        Attention and Perception: Attention normal.        Mood and Affect: Mood is anxious and depressed.        Speech: Speech normal.        Behavior: Behavior  normal.        Thought Content: Thought content normal.        Cognition and Memory: Cognition and memory normal.  Left wrist and right knee dressed  CBC:    BMET Recent Labs    01/31/22 0204 01/31/22 0416  NA 132* 136  K 4.6 3.2*  CL 99 97*  CO2 14* 22  GLUCOSE 80 90  BUN 117* 51*  CREATININE 20.20* 10.24*  CALCIUM 8.7* 8.6*     Liver Panel  Recent Labs    01/31/22 0204 01/31/22 0416  ALBUMIN 2.2* 2.2*       Sedimentation Rate No results for input(s): ESRSEDRATE in the last 72 hours. C-Reactive Protein No results for input(s): CRP in the last 72 hours.  Micro Results: Recent Results (from the past 720 hour(s))  Culture, blood (routine x 2)     Status: None (Preliminary result)   Collection Time: 01/30/22  3:21 AM   Specimen: BLOOD RIGHT FOREARM  Result Value Ref Range Status   Specimen Description BLOOD RIGHT FOREARM  Final   Special Requests   Final    BOTTLES DRAWN AEROBIC AND ANAEROBIC Blood Culture adequate volume   Culture   Final    NO GROWTH 2 DAYS Performed at [facility], [address] [place], [place] [address]    Report Status PENDING  Incomplete  Culture, blood (routine x 2)     Status: None (Preliminary result)   Collection Time: 01/30/22  3:21 AM   Specimen: BLOOD RIGHT HAND  Result Value Ref Range Status   Specimen Description BLOOD RIGHT HAND  Final   Special Requests   Final    BOTTLES DRAWN AEROBIC AND ANAEROBIC Blood Culture adequate volume   Culture   Final    NO GROWTH 2 DAYS Performed at [facility], [place] [address], [place], [place] [address]    Report Status PENDING  Incomplete  Resp Panel by RT-PCR (Flu A&B, Covid) Nasopharyngeal Swab     Status: None   Collection Time: 01/30/22  4:40 AM   Specimen: Nasopharyngeal Swab; Nasopharyngeal(NP) swabs in vial transport medium  Result Value Ref Range Status   SARS Coronavirus 2 by RT PCR NEGATIVE NEGATIVE Final    Comment: (NOTE) SARS-CoV-2 target nucleic acids  are NOT DETECTED.  The SARS-CoV-2  RNA is generally detectable in upper respiratory specimens during the acute phase of infection. The lowest concentration of SARS-CoV-2 viral copies this assay can detect is 138 copies/mL. A negative result does not preclude SARS-Cov-2 infection and should not be used as the sole basis for treatment or other patient management decisions. A negative result may occur with  improper specimen collection/handling, submission of specimen other than nasopharyngeal swab, presence of viral mutation(s) within the areas targeted by this assay, and inadequate number of viral copies(<138 copies/mL). A negative result must be combined with clinical observations, patient history, and epidemiological information. The expected result is Negative.  Fact Sheet for Patients:  EntrepreneurPulse.com.au  Fact Sheet for Healthcare Providers:  IncredibleEmployment.be  This test is no t yet approved or cleared by the [place] FDA and  has been authorized for detection and/or diagnosis of SARS-CoV-2 by FDA under an Emergency Use Authorization (EUA). This EUA will remain  in effect (meaning this test can be used) for the duration of the COVID-19 declaration under Section 564(b)(1) of the Act, 21 U.S.C.section 360bbb-3(b)(1), unless the authorization is terminated  or revoked sooner.       Influenza A by PCR NEGATIVE NEGATIVE Final   Influenza B by PCR NEGATIVE NEGATIVE Final    Comment: (NOTE) The Xpert Xpress SARS-CoV-2/FLU/RSV plus assay is intended as an aid in the diagnosis of influenza from Nasopharyngeal swab specimens and should not be used as a sole basis for treatment. Nasal washings and aspirates are unacceptable for Xpert Xpress SARS-CoV-2/FLU/RSV testing.  Fact Sheet for Patients: EntrepreneurPulse.com.au  Fact Sheet for Healthcare Providers: IncredibleEmployment.be  This test is  not yet approved or cleared by the [place] FDA and has been authorized for detection and/or diagnosis of SARS-CoV-2 by FDA under an Emergency Use Authorization (EUA). This EUA will remain in effect (meaning this test can be used) for the duration of the COVID-19 declaration under Section 564(b)(1) of the Act, 21 U.S.C. section 360bbb-3(b)(1), unless the authorization is terminated or revoked.  Performed at [facility], [place] [address], [place], [place] [address]   Body fluid culture w Gram Stain     Status: None (Preliminary result)   Collection Time: 01/30/22  9:41 AM   Specimen: Synovium; Body Fluid  Result Value Ref Range Status   Specimen Description SYNOVIAL FLUID  Final   Special Requests RIGHT KNEE  Final   Gram Stain   Final    ABUNDANT WBC PRESENT,BOTH PMN AND MONONUCLEAR RARE GRAM POSITIVE COCCI Gram Stain Report Called to,Read Back By and Verified With: S,BOKSHAN MD@ 1155 01/30/22 EB CORRECTED RESULTS PREVIOUSLY REPORTED AS: NO ORGANISMS SEEN CORRECTED RESULTS CALLED TO: S,BOKSHAN MD @1250  01/30/22 EB    Culture   Final    CULTURE REINCUBATED FOR BETTER GROWTH Performed at [facility], [place] [address] [place], [place] [address]    Report Status PENDING  Incomplete  Aerobic/Anaerobic Culture w Gram Stain (surgical/deep wound)     Status: None (Preliminary result)   Collection Time: 01/30/22 12:49 PM   Specimen: Synovial, Left Wrist; Body Fluid  Result Value Ref Range Status   Specimen Description WOUND  Final   Special Requests LEFT WRIST SYNOVIAL FLUID SPEC B  Final   Gram Stain   Final    MODERATE WBC PRESENT, PREDOMINANTLY PMN ABUNDANT GRAM POSITIVE COCCI IN PAIRS RESULT CALLED TO, READ BACK BY AND VERIFIED WITH: RN OR.7 AT 1450 ON 01/30/2022 BY T.SAAD. Performed at [facility], [place]  [address], [place], [place] [address]    Culture   Final    MODERATE STREPTOCOCCUS GROUP G Beta hemolytic streptococci are predictably susceptible to  penicillin and other beta lactams. Susceptibility testing not routinely performed. NO ANAEROBES ISOLATED; CULTURE IN PROGRESS FOR 5 DAYS    Report Status PENDING  Incomplete    Studies/Results: No results found.    Assessment/Plan:  INTERVAL HISTORY: Group G Streptococcus is growing from wrist culture   Principal Problem:   Septic arthritis of multiple joints (HCC) Active Problems:   ESRD on dialysis [facility])   Knee pain, acute   Hyperphosphatemia   Effusion of right knee   Hyperkalemia   Arthritis, septic ([place])   Dialysis patient, noncompliant ([place] [facility])   Left wrist pain   Group G streptococcal infection    Natasha Long is a 26 y.o. female with end-stage renal disease on hemodialysis having missed dialysis for since the beginning of February while Natasha Long has been homeless now with polyarticular septic arthritis status post I&D of her right knee and left wrist.  Left wrist cultures have yielded group G Streptococcus.  Right knee cultures are still incubating  #1 Polyarticular native joint arthritis status post I&D  [place] to cefazolin and Natasha Long completed 6 weeks of cefazolin with hemodialysis  #2 ESRD on HD Tu, TH, Sat   #3 Homelessness: Natasha Long needs stable housing so that Natasha Long can receive HD and her antibiotics   Vandora Ericsson has an appointment on March 22 at 1:45 PM with Dr. Tommy Medal  The [facility] is located in the [facility] at  [place] in [place].  [address], which is located to the left of the elevators.  Phone: [phone]  Fax: [phone]  [email or web address]  Natasha Long should arrive 30 minutes prior to her appointment.  I will sign off for now please do not hesitate to call with further questions.    LOS: 3 days   Alcide Evener 02/01/2022, 3:41 PM

## 2022-02-01 NOTE — Evaluation (Signed)
Physical Therapy Evaluation ?Patient Details ?Name: Natasha Long ?MRN: [id number] ?DOB: 07-11-1996 ?Today's Date: 02/01/2022 ? ?History of Present Illness ? 26 y.o. female presented to ED 01/28/22 for pain in right knee which occurred after she twisted it when she stumbled, after twisting her left ankle.  Right knee x-ray, CT right knee-degenerative joint present, knee effusion present, no fracture present. Needs dialysis after not presenting for dialysis x 2 weeks. 01/30/22 Rt knee aspiration and injection; 01/30/22 due to septic joint underwent Rt knee extensive synovectomy and later meniscal debridement; also developed left wrist pain and had arthrotomy for irrigation due to infection;    PMH nephrotic syndrome now on HD TThS, HTN, diastolic heart failure, borderline personality disorder, generalized anxiety, major depressive disorder, and recent homelessness  ?Clinical Impression ?  ?Pt admitted with above diagnosis. Limited PT eval done due to pt's difficulty participating in mobility activities at this time; Inferred that pt is usually independent; Noting that she has difficulty complying with HD; Presents to PT with exquisite pain limiting her ability to participate in mobility activities; Refused mobility despite encouragemnt and attempts at education re: need to move, effects of bedrest;  Pt currently with functional limitations due to the deficits listed below (see PT Problem List). Pt will benefit from skilled PT to increase their independence and safety with mobility to allow discharge to the venue listed below.   ?   ? Will continue efforts at mobility.  ? ?Recommendations for follow up therapy are one component of a multi-disciplinary discharge planning process, led by the attending physician.  Recommendations may be updated based on patient status, additional functional criteria and insurance authorization. ? ?Follow Up Recommendations Other (comment) (Difficult to discern at this point with decr ability  to participate; Will be updated as able) ? ?  ?Assistance Recommended at Discharge Intermittent Supervision/Assistance (Difficult to discern at this point with decr ability to participate; Will be updated as able)  ?Patient can return home with the following ? Other (comment) (Difficult to discern at this point with decr ability to participate; Will be updated as able) ? ?  ?Equipment Recommendations  (Will start with L platform RW, and update as able)  ?Recommendations for Other Services ? OT consult (as ordered)  ?  ?Functional Status Assessment Patient has had a recent decline in their functional status and/or demonstrates limited ability to make significant improvements in function in a reasonable and predictable amount of time  ? ?  ?Precautions / Restrictions Precautions ?Precautions: Fall;Other (comment) ?Precaution Comments: L wrist splint for comfort; L wrist/digit ROM ok ?Restrictions ?Weight Bearing Restrictions: No ?RLE Weight Bearing: Weight bearing as tolerated ?Other Position/Activity Restrictions: assummed WB through elbow for L UE  ? ?  ? ?Mobility ? Bed Mobility ?  ?  ?  ?  ?  ?  ?  ?General bed mobility comments: Attempted; As bed was prepped for getting up, Pt was given cues for moving her L hand and wrist (either actively moving her L shoulder and elbow, or  suggested she use her Right UE to assist lifting her LUE if unable to actively lift at her shoulder and elbow); pt had exquisite pain in L wrist when bedclothes were pulled down from under her L hand and wrist, and refused any further mobility ?  ? ?Transfers ?  ?  ?  ?  ?  ?  ?  ?  ?  ?  ?  ? ?Ambulation/Gait ?  ?  ?  ?  ?  ?  ?  ?  ? ?  Stairs ?  ?  ?  ?  ?  ? ?Wheelchair Mobility ?  ? ?Modified Rankin (Stroke Patients Only) ?  ? ?  ? ?Balance   ?  ?  ?  ?  ?  ?  ?  ?  ?  ?  ?  ?  ?  ?  ?  ?  ?  ?  ?   ? ? ? ?Pertinent Vitals/Pain Pain Assessment ?Pain Assessment: Faces ?Faces Pain Scale: Hurts whole lot ?Pain Location: L wrist with  movement ?Pain Descriptors / Indicators: Grimacing, Guarding ?Pain Intervention(s): Premedicated before session  ? ? ?Home Living Family/patient expects to be discharged to:: Other (Comment) ?  ?  ?  ?  ?  ?  ?  ?  ?  ?Additional Comments: Pt did not give information re: home status or social support; Noted she is experiencing homelessness per chart review  ?  ?Prior Function Prior Level of Function : Independent/Modified Independent;Patient poor historian/Family not available ?  ?  ?  ?  ?  ?  ?  ?  ?  ? ? ?Hand Dominance  ? Dominant Hand: Right ? ?  ?Extremity/Trunk Assessment  ? Upper Extremity Assessment ?Upper Extremity Assessment: Defer to OT evaluation ?  ? ?Lower Extremity Assessment ?Lower Extremity Assessment: RLE deficits/detail ?RLE Deficits / Details: Pt did not demonstrate active movement of her RLE, but she did allow for gently re-wrapping the ACE wrap around her knee with minimal movement ?RLE: Unable to fully assess due to pain ?  ? ?Cervical / Trunk Assessment ?Cervical / Trunk Assessment: Normal ([place])  ?Communication  ? Communication: Other (comment) (intentional decreased responsiveness)  ?Cognition Arousal/Alertness: Awake/alert, Lethargic ?Behavior During Therapy: Flat affect ?Overall Cognitive Status: No family/caregiver present to determine baseline cognitive functioning ?  ?  ?  ?  ?  ?  ?  ?  ?  ?  ?  ?  ?  ?  ?  ?  ?General Comments: Answers some questions but appears to intentionally ignore staff inquires. Eyes closed majority of session, but able to shift from seemingly asleep to alert and yelling.  Appears capable of following directions but resistant to do so. self limiting behaviors ?  ?  ? ?  ?General Comments General comments (skin integrity, edema, etc.): Refused mobility after L wrist pain, but was able to tolerate re-doing ACE wrap at knee ? ?  ?Exercises    ? ?Assessment/Plan  ?  ?PT Assessment Patient needs continued PT services  ?PT Problem List Decreased  strength;Decreased range of motion;Decreased activity tolerance;Decreased mobility;Decreased cognition;Decreased knowledge of use of DME;Decreased safety awareness;Decreased knowledge of precautions;Pain ? ?   ?  ?PT Treatment Interventions DME instruction;Gait training;Stair training;Functional mobility training;Therapeutic activities;Therapeutic exercise;Balance training;Neuromuscular re-education;Cognitive remediation;Patient/family education   ? ?PT Goals (Current goals can be found in the Care Plan section)  ?Acute Rehab PT Goals ?Patient Stated Goal: Did not state a goal ?PT Goal Formulation: Patient unable to participate in goal setting ?Time For Goal Achievement: 02/15/22 ?Potential to Achieve Goals: Fair ? ?  ?Frequency Min 3X/week ?  ? ? ?Co-evaluation   ?  ?  ?  ?  ? ? ?  ?AM-PAC PT "6 Clicks" Mobility  ?Outcome Measure Help needed turning from your back to your side while in a flat bed without using bedrails?: A Lot ?Help needed moving from lying on your back to sitting on the side of a flat bed without using bedrails?: A Lot ?Help  needed moving to and from a bed to a chair (including a wheelchair)?: A Lot ?Help needed standing up from a chair using your arms (e.g., wheelchair or bedside chair)?: A Lot ?Help needed to walk in hospital room?: A Lot ?Help needed climbing 3-5 steps with a railing? : Total ?6 Click Score: 11 ? ?  ?End of Session   ?Activity Tolerance: Patient limited by pain ?Patient left: in bed;with call bell/phone within reach;Other (comment) (Dr. Hollie Salk with Nephrology in room) ?Nurse Communication: Mobility status ?PT Visit Diagnosis: Other abnormalities of gait and mobility (R26.89);Pain ?Pain - Right/Left:  (R knee, L wrist and hand) ?Pain - part of body:  (R knee, L wrist and hand) ?  ? ?Time: 1005-1020 ?PT Time Calculation (min) (ACUTE ONLY): 15 min ? ? ?Charges:   PT Evaluation ?$PT Eval Moderate Complexity: 1 Mod ?  ?  ?   ? ? ?Roney Marion, PT  ?Acute Rehabilitation  Services ?Pager [phone] ?Office [phone] ? ? ?[name] ?02/01/2022, 2:35 PM ? ?

## 2022-02-01 NOTE — Progress Notes (Signed)
Subjective: Upset at PT this AM--> says she's in too much pain to work.  Requesting more pain meds.   ? ?Objective ?Vital signs in last 24 hours: ?Vitals:  ? 01/31/22 1631 01/31/22 2003 02/01/22 0541 02/01/22 0948  ?BP: (!) 149/84 (!) 147/82 (!) 151/77 (!) 141/78  ?Pulse: 94 99 97 96  ?Resp: 18 19 18 16   ?Temp: 98.5 ?F (36.9 ?C) 99.2 ?F (37.3 ?C) 98.4 ?F (36.9 ?C) 98.2 ?F (36.8 ?C)  ?TempSrc: Oral Oral  Oral  ?SpO2: 100% 100% 100% 97%  ?Weight:      ?Height:      ? ?Weight change:  ? ?Physical Exam: ?General: lying flat in bed, talking with PT ?Heart: RRR no MRG ?Lungs: CTA nonlabored breathing ?Abdomen: Obese, NABS, S, NT ND ?Extremities: There is bipedal edema, right knee dressing dry clear and left wrist dressing dry clear--> penrose out ?Dialysis Access: L A AVF+ bruit ? ? ?Dialysis Order s: Center: [facility], TTS, 3.5 hs,BFR 350 / Auto flow DFR ?2K, 2.5 CA ?Heparin 5000 units ?Mircera 100 mcg last given 11/20/01 ?Calcitriol 1.75 mcg p.o. q. HD  ?Venofer 50 MG q. weekly  ?LUA AVF ?  ?Problem/Plan: ?Altered mental status with severe uremia(BUN-158 creatinine 30) 2/2 missed OP HD ,last HD 01/08/2022 , HD 3/02, 3/03 creatinine improved to 10, BUN 51 this a.m.  Improved.   ?ESRD -chronic HD TTS with OP HD noncompliance /a.m. labs as above improved, hold HD today next on Monday and then try to get back on outpatient schedule  ?Hypertension/volume  -tolerated 1 L UF 3/02 and 1.5 L yesterday ,BP improved with UF and meds ?Right knee pain //infection /status post knee irrigation and debridement 3/04 elevated WBC, Ortho consulting ,on IV vancomycin /Maxipime, noted ID consult recommended ?Left wrist questionable infection status post I&D by hand surgeon 3/03. Hand Surg.following ?Anemia  -a.m. Hgb 6.6 < 7.4 , will transfuse 1 unit today also, give ESA , currently avoid iron with febrile infection picture ?Metabolic bone disease -a.m. lab corrected calcium over 10, phos. 10.9 to 8.4 continue binders  hold vitamin D until  CA  improved ?Nutrition -albumin 2.2 protein supplements with renal failure diet renal vitamin when taking p.o.'s ?History of borderline personality disorder, major depressive order, generalized anxiety/last reported homeless= plan per admit team ? ? ?Madelon Lips MD ?[facility] ?02/01/2022,11:44 AM ? LOS: 3 days  ? ?Labs: ?Basic Metabolic Panel: ?Recent Labs  ?Lab 01/30/22 ?0321 01/31/22 ?0204 01/31/22 ?0416  ?NA 133* 132* 136  ?K 3.8 4.6 3.2*  ?CL 100 99 97*  ?CO2 18* 14* 22  ?GLUCOSE 127* 80 90  ?BUN 104* 117* 51*  ?CREATININE 20.31* 20.20* 10.24*  ?CALCIUM 8.3* 8.7* 8.6*  ?PHOS 8.4* 10.9* 5.3*  ? ?Liver Function Tests: ?Recent Labs  ?Lab 01/30/22 ?0321 01/31/22 ?0204 01/31/22 ?0416  ?ALBUMIN 2.4* 2.2* 2.2*  ? ?No results for input(s): LIPASE, AMYLASE in the last 168 hours. ?No results for input(s): AMMONIA in the last 168 hours. ?CBC: ?Recent Labs  ?Lab 01/28/22 ?2348 01/29/22 ?1315 01/30/22 ?0321 01/31/22 ?6734 01/31/22 ?1113  ?WBC 13.3* 15.7* 13.7* 15.2* 13.2*  ?NEUTROABS 11.9*  --   --   --  11.0*  ?HGB 7.4* 7.6* 7.4* 6.6* 7.4*  ?HCT 22.6* 22.6* 21.5* 19.7* 21.3*  ?MCV 92.2 91.5 87.8 90.8 86.9  ?PLT 174 155 145* 163 165  ? ?Cardiac Enzymes: ?No results for input(s): CKTOTAL, CKMB, CKMBINDEX, TROPONINI in the last 168 hours. ?CBG: ?No results for input(s): GLUCAP in the  last 168 hours. ? ?Studies/Results: ?DG Wrist 2 Views Left ? ?Result Date: 01/30/2022 ?CLINICAL DATA:  Left wrist pain EXAM: LEFT WRIST - 2 VIEW COMPARISON:  None. FINDINGS: There is no evidence of fracture or dislocation. Carpus is normally aligned. There is no evidence of arthropathy or other focal bone abnormality. Soft tissue edema about the wrist. IMPRESSION: 1. No fracture or dislocation of the left wrist. The carpus is normally aligned. 2.  Soft tissue edema about the wrist. Electronically Signed   By: Delanna Ahmadi M.D.   On: 01/30/2022 14:48   ?Medications: ? sodium chloride    ? sodium chloride    ? sodium chloride 10 mL/hr  at 01/30/22 1700  ?  ceFAZolin (ANCEF) IV 1 g (01/31/22 1634)  ? ? acetaminophen  1,000 mg Oral Q8H  ? ARIPiprazole  7.5 mg Oral Daily  ? Chlorhexidine Gluconate Cloth  6 each Topical Q0600  ? [START ON 02/14/2022] darbepoetin (ARANESP) injection - DIALYSIS  100 mcg Intravenous Q Sat-HD  ? feeding supplement (NEPRO CARB STEADY)  237 mL Oral BID BM  ? heparin injection (subcutaneous)  5,000 Units Subcutaneous Q8H  ? losartan  50 mg Oral Daily  ? multivitamin  1 tablet Oral Daily  ? pantoprazole  40 mg Oral Daily  ? ? ? ? ?

## 2022-02-02 ENCOUNTER — Encounter (HOSPITAL_COMMUNITY): Payer: Self-pay | Admitting: Orthopedic Surgery

## 2022-02-02 LAB — CBC WITH DIFFERENTIAL/PLATELET
Abs Immature Granulocytes: 0.14 10*3/uL — ABNORMAL HIGH (ref 0.00–0.07)
Basophils Absolute: 0 10*3/uL (ref 0.0–0.1)
Basophils Relative: 0 %
Eosinophils Absolute: 0.1 10*3/uL (ref 0.0–0.5)
Eosinophils Relative: 1 %
HCT: 26.1 % — ABNORMAL LOW (ref 36.0–46.0)
Hemoglobin: 9 g/dL — ABNORMAL LOW (ref 12.0–15.0)
Immature Granulocytes: 1 %
Lymphocytes Relative: 7 %
Lymphs Abs: 0.7 10*3/uL (ref 0.7–4.0)
MCH: 29.1 pg (ref 26.0–34.0)
MCHC: 34.5 g/dL (ref 30.0–36.0)
MCV: 84.5 fL (ref 80.0–100.0)
Monocytes Absolute: 0.9 10*3/uL (ref 0.1–1.0)
Monocytes Relative: 9 %
Neutro Abs: 8 10*3/uL — ABNORMAL HIGH (ref 1.7–7.7)
Neutrophils Relative %: 82 %
Platelets: 206 10*3/uL (ref 150–400)
RBC: 3.09 MIL/uL — ABNORMAL LOW (ref 3.87–5.11)
RDW: 17.7 % — ABNORMAL HIGH (ref 11.5–15.5)
WBC: 9.9 10*3/uL (ref 4.0–10.5)
nRBC: 0 % (ref 0.0–0.2)

## 2022-02-02 LAB — CBC
HCT: 20.1 % — ABNORMAL LOW (ref 36.0–46.0)
Hemoglobin: 6.4 g/dL — CL (ref 12.0–15.0)
MCH: 29 pg (ref 26.0–34.0)
MCHC: 31.8 g/dL (ref 30.0–36.0)
MCV: 91 fL (ref 80.0–100.0)
Platelets: 205 10*3/uL (ref 150–400)
RBC: 2.21 MIL/uL — ABNORMAL LOW (ref 3.87–5.11)
RDW: 15.2 % (ref 11.5–15.5)
WBC: 9.5 10*3/uL (ref 4.0–10.5)
nRBC: 0 % (ref 0.0–0.2)

## 2022-02-02 LAB — PREPARE RBC (CROSSMATCH)

## 2022-02-02 LAB — RENAL FUNCTION PANEL
Albumin: 1.8 g/dL — ABNORMAL LOW (ref 3.5–5.0)
Anion gap: 19 — ABNORMAL HIGH (ref 5–15)
BUN: 91 mg/dL — ABNORMAL HIGH (ref 6–20)
CO2: 20 mmol/L — ABNORMAL LOW (ref 22–32)
Calcium: 8.4 mg/dL — ABNORMAL LOW (ref 8.9–10.3)
Chloride: 97 mmol/L — ABNORMAL LOW (ref 98–111)
Creatinine, Ser: 15.65 mg/dL — ABNORMAL HIGH (ref 0.44–1.00)
GFR, Estimated: 3 mL/min — ABNORMAL LOW (ref >60)
Glucose, Bld: 102 mg/dL — ABNORMAL HIGH (ref 70–99)
Phosphorus: 10.8 mg/dL — ABNORMAL HIGH (ref 2.5–4.6)
Potassium: 3.7 mmol/L (ref 3.5–5.1)
Sodium: 136 mmol/L (ref 135–145)

## 2022-02-02 MED ORDER — HYDROMORPHONE HCL 1 MG/ML IJ SOLN
0.5000 mg | INTRAMUSCULAR | Status: DC
Start: 2022-02-02 — End: 2022-02-03
  Administered 2022-02-02 – 2022-02-03 (×6): 0.5 mg via INTRAVENOUS
  Filled 2022-02-02 (×4): qty 1

## 2022-02-02 MED ORDER — CALCITRIOL 0.25 MCG PO CAPS: 0.2500 ug | ORAL_CAPSULE | ORAL | Status: DC

## 2022-02-02 MED ORDER — SODIUM CHLORIDE 0.9% IV SOLUTION: Freq: Once | INTRAVENOUS | Status: DC

## 2022-02-02 MED ORDER — HYDROMORPHONE HCL 1 MG/ML IJ SOLN
0.5000 mg | INTRAMUSCULAR | Status: DC | PRN
  Administered 2022-02-02 – 2022-02-03 (×4): 0.5 mg via INTRAVENOUS
  Filled 2022-02-02 (×5): qty 1

## 2022-02-02 MED ORDER — CEFAZOLIN SODIUM-DEXTROSE 1-4 GM/50ML-% IV SOLN
1.0000 g | Freq: Every day | INTRAVENOUS | Status: DC
  Administered 2022-02-02: 1 g via INTRAVENOUS
  Filled 2022-02-02: qty 50

## 2022-02-02 MED ORDER — DARBEPOETIN ALFA 100 MCG/0.5ML IJ SOSY
100.0000 ug | PREFILLED_SYRINGE | INTRAMUSCULAR | Status: DC
  Administered 2022-02-07 – 2022-02-14 (×2): 100 ug via INTRAVENOUS
  Filled 2022-02-02 (×3): qty 0.5

## 2022-02-02 MED ORDER — CALCIUM ACETATE (PHOS BINDER) 667 MG PO CAPS: 667.0000 mg | ORAL_CAPSULE | Freq: Three times a day (TID) | ORAL | Status: DC

## 2022-02-02 MED ORDER — CALCIUM ACETATE (PHOS BINDER) 667 MG PO CAPS
667.0000 mg | ORAL_CAPSULE | Freq: Three times a day (TID) | ORAL | Status: DC
  Administered 2022-02-02 – 2022-02-03 (×4): 667 mg via ORAL
  Filled 2022-02-02 (×5): qty 1

## 2022-02-02 MED ORDER — HYDROMORPHONE HCL 1 MG/ML IJ SOLN
INTRAMUSCULAR | Status: AC
  Filled 2022-02-02: qty 0.5

## 2022-02-02 NOTE — Progress Notes (Signed)
? ?  Subjective: ? ?Continues to report poorly controlled pain. Remains afebrile. Minimal pain about knee with 20 arc of motion. ? ?Objective:  ? ?VITALS:   ?Vitals:  ? 02/01/22 0948 02/01/22 1755 02/01/22 2035 02/02/22 0406  ?BP: (!) 141/78 (!) 145/79 (!) 145/78 (!) 159/88  ?Pulse: 96 97 93 96  ?Resp: 16 18 18 17   ?Temp: 98.2 ?F (36.8 ?C) 98.7 ?F (37.1 ?C) 99.3 ?F (37.4 ?C) 98 ?F (36.7 ?C)  ?TempSrc: Oral Oral Oral   ?SpO2: 97% 100% 95% 98%  ?Weight:      ?Height:      ? ? ?Right leg with dressing in place. Able to flex to 50 degrees minimal pain. SILT all distributions right foot. 2+ DP pulse ? ?Lab Results  ?Component Value Date  ? WBC 13.2 (H) 01/31/2022  ? HGB 7.4 (L) 01/31/2022  ? HCT 21.3 (L) 01/31/2022  ? MCV 86.9 01/31/2022  ? PLT 165 01/31/2022  ? ? ? ?Assessment/Plan: ? ?3 Days Post-Op right knee irrigation and debridement, I have instructed her on importance of ROM of the knee and mobilization in order to prevent arthrofibrosis ? ?- Patient to work with PT/OT to optimize mobilization safely ?- DVT ppx - SCDs, ambulation, Heparin ?- WBAT operative extremity ?- Will f/u knee aspirate cultures ?- ID recommends narrowing to ancef x 6 weeks ?- Discharge planning pending CM, appreciate coordination  ? ? ?Natasha Long ?02/02/2022, 7:38 AM ? ?

## 2022-02-02 NOTE — Progress Notes (Addendum)
?[facility] ?Progress Note  ? ?Subjective:    ?Seen and examined patient on HD. Denies SOB and CP. Still c/o R knee pain. ? ?Objective ?Vitals:  ? 02/02/22 0900 02/02/22 0930 02/02/22 1000 02/02/22 1015  ?BP: (!) 164/85 (!) 161/86 (!) 170/88 (!) 161/89  ?Pulse:  86  89  ?Resp: (!) 25 (!) 21 (!) 25 20  ?Temp:  (!) 97.3 ?F (36.3 ?C)  (!) 97.5 ?F (36.4 ?C)  ?TempSrc:  Temporal  Temporal  ?SpO2:  98%    ?Weight:      ?Height:      ? ?Physical Exam ?General: NAD ?Heart: S1 and S2; No murmurs, gallops, or rubs ?Lungs: Clear anteriorly ?Abdomen: Soft and non-tender ?Extremities: No edema BLLE ?Dialysis Access: L AVF (+) B/T  ? ?Filed Weights  ? 01/30/22 1206 02/02/22 0827  ?Weight: 108.9 kg 104.1 kg  ? ? ?Intake/Output Summary (Last 24 hours) at 02/02/2022 1139 ?Last data filed at 02/02/2022 1045 ?Gross per 24 hour  ?Intake 694.08 ml  ?Output 0 ml  ?Net 694.08 ml  ? ? ?Additional Objective ?Labs: ?Basic Metabolic Panel: ?Recent Labs  ?Lab 01/31/22 ?0204 01/31/22 ?0416 02/02/22 ?8527  ?NA 132* 136 136  ?K 4.6 3.2* 3.7  ?CL 99 97* 97*  ?CO2 14* 22 20*  ?GLUCOSE 80 90 102*  ?BUN 117* 51* 91*  ?CREATININE 20.20* 10.24* 15.65*  ?CALCIUM 8.7* 8.6* 8.4*  ?PHOS 10.9* 5.3* 10.8*  ? ?Liver Function Tests: ?Recent Labs  ?Lab 01/31/22 ?0204 01/31/22 ?0416 02/02/22 ?7824  ?ALBUMIN 2.2* 2.2* 1.8*  ? ?No results for input(s): LIPASE, AMYLASE in the last 168 hours. ?CBC: ?Recent Labs  ?Lab 01/28/22 ?2348 01/29/22 ?1315 01/30/22 ?0321 01/31/22 ?2353 01/31/22 ?1113 02/02/22 ?0904  ?WBC 13.3* 15.7* 13.7* 15.2* 13.2* 9.5  ?NEUTROABS 11.9*  --   --   --  11.0*  --   ?HGB 7.4* 7.6* 7.4* 6.6* 7.4* 6.4*  ?HCT 22.6* 22.6* 21.5* 19.7* 21.3* 20.1*  ?MCV 92.2 91.5 87.8 90.8 86.9 91.0  ?PLT 174 155 145* 163 165 205  ? ?Blood Culture ?   ?Component Value Date/Time  ? SDES WOUND 01/30/2022 1249  ? SPECREQUEST LEFT WRIST SYNOVIAL FLUID SPEC B 01/30/2022 1249  ? CULT  01/30/2022 1249  ?  MODERATE STREPTOCOCCUS GROUP G ?Beta hemolytic  streptococci are predictably susceptible to penicillin and other beta lactams. Susceptibility testing not routinely performed. ?NO ANAEROBES ISOLATED; CULTURE IN PROGRESS FOR 5 DAYS ?  ? REPTSTATUS PENDING 01/30/2022 1249  ? ? ?Cardiac Enzymes: ?No results for input(s): CKTOTAL, CKMB, CKMBINDEX, TROPONINI in the last 168 hours. ?CBG: ?No results for input(s): GLUCAP in the last 168 hours. ?Iron Studies: No results for input(s): IRON, TIBC, TRANSFERRIN, FERRITIN in the last 72 hours. ?Lab Results  ?Component Value Date  ? INR 1.4 (H) 01/30/2022  ? INR 1.0 08/17/2019  ? ?Studies/Results: ?No results found. ? ?Medications: ? sodium chloride    ? sodium chloride    ? sodium chloride 10 mL/hr at 01/30/22 1700  ?  ceFAZolin (ANCEF) IV 1 g (02/01/22 1829)  ? ? sodium chloride   Intravenous Once  ? sodium chloride   Intravenous Once  ? acetaminophen  1,000 mg Oral Q8H  ? ARIPiprazole  7.5 mg Oral Daily  ? Chlorhexidine Gluconate Cloth  6 each Topical Q0600  ? [START ON 02/14/2022] darbepoetin (ARANESP) injection - DIALYSIS  100 mcg Intravenous Q Sat-HD  ? feeding supplement (NEPRO CARB STEADY)  237 mL Oral BID BM  ?  heparin injection (subcutaneous)  5,000 Units Subcutaneous Q8H  ? HYDROmorphone      ?  HYDROmorphone (DILAUDID) injection  0.5 mg Intravenous Q4H  ? losartan  50 mg Oral Daily  ? multivitamin  1 tablet Oral Daily  ? pantoprazole  40 mg Oral Daily  ? ? ?Dialysis Orders: ?[facility], TTS, 3.5 hs,BFR 350 / Auto flow DFR ?2K, 2.5 CA ?Heparin 5000 units ?Mircera 100 mcg last given 11/20/01 ?Calcitriol 1.75 mcg p.o. q. HD  ?Venofer 50 MG q. weekly  ?LUA AVF ? ?Assessment/Plan: ?Altered mental status with severe uremia(BUN-158 creatinine 30) 2/2 missed OP HD ,last HD 01/08/2022 , HD 3/02, 3/03 creatinine improved to 10, BUN 51 this a.m.  Improved.  ?ESRD -chronic HD TTS with OP HD noncompliance /a.m. labs as above improved. On HD today. Plan for HD tomorrow 3/7 for short tx to place patient back on  schedule. ?Hypertension/volume  -tolerated 1 L UF 3/02 and 1.5 L yesterday ,BP improved with UF and meds ?Right knee pain //infection /status post knee irrigation and debridement 3/04. R knee cx (+) group g streptococcus. WBC elevated-now slowly improving. Ortho and ID following-continue Cefazolin ?Left wrist questionable infection status post I&D by hand surgeon 3/03. Reviewed cultures: (+) Group G streptococcus ?Anemia  -S/p transfuse 1 unit 3/5 for Hgb 6.6. Bumped up to 7.4 but back down to 6.4 today. S/p 2 units PRBCs today with HD. Will re-check CBC this afternoon. ESA  recently given 3/4. Avoid iron with febrile infection picture ?Metabolic bone disease -Corr calcium over 10, phos. 10.9 to 8.4. Binders resumed today. Continue to hold vitamin D for now until CA improved ?Nutrition -albumin 2.2 protein supplements with renal failure diet renal vitamin when taking p.o.'s ?History of borderline personality disorder, major depressive order, generalized anxiety/last reported homeless= plan per admit team ? ?Tobie Poet, NP ?[facility] ?02/02/2022,11:39 AM ? LOS: 4 days  ?  ?

## 2022-02-02 NOTE — Progress Notes (Signed)
PT Cancellation Note ? ?Patient Details ?Name: Natasha Long ?MRN: [id number] ?DOB: [birth year] ? ? ?Cancelled Treatment:    Reason Eval/Treat Not Completed: Patient at procedure or test/unavailable ? ?Currently in HD; ? ?Will follow up later today as time allows;  ?Otherwise, will follow up for PT tomorrow;  ? ?Thank you,  ?Roney Marion, PT  ?Acute Rehabilitation Services ?Pager [phone] ?Office [phone] ? ? ? ?[name] ?02/02/2022, 8:38 AM ?

## 2022-02-02 NOTE — Progress Notes (Addendum)
? ?Subjective:  ? ?O/N: No acute overnight events.  ? ?Evaluated patient at bedside in HD, awake and alert. Patient reports poor pain control today, and becomes tearful. She is counseled on the need for HD compliance both for renal function and antibiotic administration, and she voices understanding. ? ? ?Objective: ? ?Vital signs in last 24 hours: ?Vitals:  ? 02/01/22 0948 02/01/22 1755 02/01/22 2035 02/02/22 0406  ?BP: (!) 141/78 (!) 145/79 (!) 145/78 (!) 159/88  ?Pulse: 96 97 93 96  ?Resp: 16 18 18 17   ?Temp: 98.2 ?F (36.8 ?C) 98.7 ?F (37.1 ?C) 99.3 ?F (37.4 ?C) 98 ?F (36.7 ?C)  ?TempSrc: Oral Oral Oral   ?SpO2: 97% 100% 95% 98%  ?Weight:      ?Height:      ? ?Physical Exam ?General: [place] female, lying in bed, alert, tearful in acute distress 2/2 pain. ?HENT: Splitting of middle of lower lip, healing. ?CV: Normal rate and regular rhythm. ?Pulm: Normal work of breathing on room air, no respiratory distress noted. ?MSK: RLE externally rotated.  Right knee wrapped in bandage, tender to palpation. Lower leg below knee is very warm compared to left.  Left wrist with dressings in place.  Able to wiggle bilateral toes to command but unable to perform full active ROM in joints due to pain.  Passive ROM intact in bilateral ankles and elbows along with left knee and right wrist and without tenderness, swelling, warmth. L arm with warmth medial to port. ?Neuro: AAOx3 with no focal deficits ? ?Assessment/Plan: ? ?Principal Problem: ?  Septic arthritis of multiple joints ([place]) ?Active Problems: ?  ESRD on dialysis [facility]) ?  Knee pain, acute ?  Hyperphosphatemia ?  Effusion of right knee ?  Hyperkalemia ?  Arthritis, septic ([place]) ?  Dialysis patient, noncompliant ([place]) ?  Left wrist pain ?  Group G streptococcal infection ? ?Natasha Long is a 26 year old female with a history of nephrotic syndrome on hemodialysis who has been noncompliant with sessions for the past 2.5 weeks, borderline personality disorder, and recent  homelessness who presented with traumatic knee injury after falling down a flight of stairs, found to have multifocal septic arthritis. ? ?Acute traumatic knee injury ?Multifocal septic arthritis ?Afebrile overnight. She is postop day 3 after I&D of right knee and left wrist by Ortho, appreciate assistance.  Right knee and L wrist fluid prelim cultures showing group G Streptococcus, awaiting sensitivities.  No growth in R hand and R forearm. Discussed with pharmacy, cefazolin should provide adequate coverage.  ID following. ?-Appreciate Ortho, ID, and pharmacy assistance ?-IV cefazolin, awaiting sensitivities ?-tylenol q8, dilaudid 0.5mg  q4 scheduled and 0.5 mg q4 PRN for breakthrough pain ?-trend daily cbc ? ?ESRD with HD noncompliance ?Hyperphosphatemia 2/2 missed HD ?Hyperkalemia 2/2 missed HD, resolved  ?Patient reports history of nephrotic syndrome.  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  Patient reports noncompliance with hemodialysis due to being unhomed. Multiple presentations at multiple ED's after missing HD. ?-- appreciate nephrology assistance ?-Received dialysis today; per nephrology, plan for HD again tomorrow to place patient back on schedule.  ?- Trend renal function panel daily ? ?Anemia of ESRD ?Acute blood loss anemia ?Hgb 6.6 on 3/4, likely secondary to postop acute blood loss, improved to 7.4 after 1 unit PRBCs. Hgb 6.4 today, so patient transfused 2 units pRBCs. Remains asymptomatic.  ?- Aranesp 100 mg to be given with HD q Saturday. ?- Trend daily CBC ?- Transfuse Hgb less than 7.0 ?  ?History of borderline personality disorder ?Major  depressive disorder ?Generalized anxiety disorder ?Follow[facility] at [facility] for care.  Home medications: Abilify 10 mg (patient-reported dose, 7.5 documented), gabapentin 300 mg 3 times daily, and hydroxyzine 25 mg 3 times daily as needed. ?- Continue home Abilify 7.5 mg and hydroxyzine 25 mg PRN anxiety ?- TOC consult placed for unhomed  status ?  ?Hypertension ?Patient non-adherent with home medications. Improved with HD and home losartan.  ?-- Continue home Losartan 50 mg daily ?-- Monitor daily vitals ? ?Prior to Admission Living Arrangement: Unhomed, awaiting housing at [employer]. ?Anticipated Discharge Location: Pending ?Barriers to Discharge: Unhomed status ?Dispo: Anticipated discharge in approximately 2-3 day(s).  ? ?[name], MD ?02/02/2022, 6:13 AM ?Pager: [phone] ?After 5pm on weekdays and 1pm on weekends: On Call pager [phone]  ?

## 2022-02-02 NOTE — Anesthesia Postprocedure Evaluation (Signed)
Anesthesia Post Note ? ?Patient: Rodnisha Blomgren ? ?Procedure(s) Performed: IRRIGATION AND DEBRIDEMENT EXTREMITY (Left) ? ?  ? ?Patient location during evaluation: PACU ?Anesthesia Type: General ?Level of consciousness: awake and alert ?Pain management: pain level controlled ?Vital Signs Assessment: post-procedure vital signs reviewed and stable ?Respiratory status: spontaneous breathing, nonlabored ventilation, respiratory function stable and patient connected to nasal cannula oxygen ?Cardiovascular status: blood pressure returned to baseline and stable ?Postop Assessment: no apparent nausea or vomiting ?Anesthetic complications: no ? ? ?No notable events documented. ? ?Last Vitals:  ?Vitals:  ? 02/01/22 2035 02/02/22 0406  ?BP: (!) 145/78 (!) 159/88  ?Pulse: 93 96  ?Resp: 18 17  ?Temp: 37.4 ?C 36.7 ?C  ?SpO2: 95% 98%  ?  ?Last Pain:  ?Vitals:  ? 02/02/22 0336  ?TempSrc:   ?PainSc: Asleep  ? ? ?  ?  ?  ?  ?  ?  ? ?Mikailah Morel S ? ? ? ? ?

## 2022-02-03 DIAGNOSIS — D631 Anemia in chronic kidney disease: Secondary | ICD-10-CM

## 2022-02-03 DIAGNOSIS — M0029 Other streptococcal polyarthritis: Secondary | ICD-10-CM | POA: Diagnosis not present

## 2022-02-03 DIAGNOSIS — N186 End stage renal disease: Secondary | ICD-10-CM | POA: Diagnosis not present

## 2022-02-03 DIAGNOSIS — I120 Hypertensive chronic kidney disease with stage 5 chronic kidney disease or end stage renal disease: Secondary | ICD-10-CM

## 2022-02-03 DIAGNOSIS — F329 Major depressive disorder, single episode, unspecified: Secondary | ICD-10-CM

## 2022-02-03 DIAGNOSIS — F411 Generalized anxiety disorder: Secondary | ICD-10-CM

## 2022-02-03 LAB — BODY FLUID CULTURE W GRAM STAIN

## 2022-02-03 LAB — RENAL FUNCTION PANEL
Albumin: 1.9 g/dL — ABNORMAL LOW (ref 3.5–5.0)
Anion gap: 17 — ABNORMAL HIGH (ref 5–15)
BUN: 55 mg/dL — ABNORMAL HIGH (ref 6–20)
CO2: 24 mmol/L (ref 22–32)
Calcium: 8.9 mg/dL (ref 8.9–10.3)
Chloride: 96 mmol/L — ABNORMAL LOW (ref 98–111)
Creatinine, Ser: 10.14 mg/dL — ABNORMAL HIGH (ref 0.44–1.00)
GFR, Estimated: 5 mL/min — ABNORMAL LOW (ref >60)
Glucose, Bld: 90 mg/dL (ref 70–99)
Phosphorus: 8.8 mg/dL — ABNORMAL HIGH (ref 2.5–4.6)
Potassium: 3.9 mmol/L (ref 3.5–5.1)
Sodium: 137 mmol/L (ref 135–145)

## 2022-02-03 LAB — CBC
HCT: 26.8 % — ABNORMAL LOW (ref 36.0–46.0)
Hemoglobin: 9.1 g/dL — ABNORMAL LOW (ref 12.0–15.0)
MCH: 29.2 pg (ref 26.0–34.0)
MCHC: 34 g/dL (ref 30.0–36.0)
MCV: 85.9 fL (ref 80.0–100.0)
Platelets: 226 10*3/uL (ref 150–400)
RBC: 3.12 MIL/uL — ABNORMAL LOW (ref 3.87–5.11)
RDW: 18.4 % — ABNORMAL HIGH (ref 11.5–15.5)
WBC: 11.3 10*3/uL — ABNORMAL HIGH (ref 4.0–10.5)
nRBC: 0 % (ref 0.0–0.2)

## 2022-02-03 LAB — BPAM RBC
Blood Product Expiration Date: 202303222359
Blood Product Expiration Date: 202303232359
Blood Product Expiration Date: 202303242359
ISSUE DATE / TIME: 202303040500
ISSUE DATE / TIME: 202303061000
ISSUE DATE / TIME: 202303061038
Unit Type and Rh: 600
Unit Type and Rh: 600
Unit Type and Rh: 600

## 2022-02-03 LAB — TYPE AND SCREEN
ABO/RH(D): A NEG
Antibody Screen: NEGATIVE
Unit division: 0
Unit division: 0
Unit division: 0

## 2022-02-03 LAB — C-REACTIVE PROTEIN: CRP: 28.6 mg/dL — ABNORMAL HIGH (ref <1.0)

## 2022-02-03 MED ORDER — HYDROMORPHONE HCL 1 MG/ML IJ SOLN
1.0000 mg | INTRAMUSCULAR | Status: DC
  Administered 2022-02-03 – 2022-02-05 (×12): 1 mg via INTRAVENOUS
  Filled 2022-02-03 (×12): qty 1

## 2022-02-03 MED ORDER — CEFAZOLIN SODIUM-DEXTROSE 2-4 GM/100ML-% IV SOLN
2.0000 g | INTRAVENOUS | Status: DC
  Administered 2022-02-03: 2 g via INTRAVENOUS
  Filled 2022-02-03: qty 100

## 2022-02-03 MED ORDER — WHITE PETROLATUM EX OINT
TOPICAL_OINTMENT | CUTANEOUS | Status: DC | PRN
  Filled 2022-02-03: qty 28.35

## 2022-02-03 NOTE — Progress Notes (Signed)
Pharmacy Antibiotic Note ? ?Natasha Long is a 26 y.o. female admitted on 01/28/2022 with  knee pain following traumatic fall with concern for septic joint .  Pharmacy has been consulted for cefazolin dosing. ? ?Last HD 3/6 ?Planning for HD today, 3/7, for short tx to resume home schedule of TTS ? ?Plan: ?Change cefazolin to 2g every TTS with hemodialysis. ? ?Height: 5\' 8"  (172.7 cm) ?Weight: 104.1 kg (229 lb 8 oz) ?IBW/kg (Calculated) : 63.9 ? ?Temp (24hrs), Avg:98.5 ?F (36.9 ?C), Min:97.4 ?F (36.3 ?C), Max:99.7 ?F (37.6 ?C) ? ?Recent Labs  ?Lab 01/30/22 ?0321 01/31/22 ?0204 01/31/22 ?6644 01/31/22 ?0347 01/31/22 ?1113 02/02/22 ?4259 02/02/22 ?1623 02/03/22 ?0429  ?WBC 13.7*  --  15.2*  --  13.2* 9.5 9.9 11.3*  ?CREATININE 20.31* 20.20*  --  10.24*  --  15.65*  --  10.14*  ? ?  ?Estimated Creatinine Clearance: 10.5 mL/min (A) (by C-G formula based on SCr of 10.14 mg/dL (H)).   ? ?Allergies  ?Allergen Reactions  ? Prozac [Fluoxetine Hcl] Other (See Comments)  ?  Panic attack   ? Wellbutrin [Bupropion] Other (See Comments)  ?  Panic attack  ? Prednisone Other (See Comments)  ?  Pt states that this med caused pancreatitis.   ? ?3/3 Bcx: ngtd ?3/3 R Knee synovial fluid: Group G Strep (S: ceftriaxone) ?3/3 L  wrist synovial fluid: moderate Group C Strep ? ?Vanco 3/3 >> 3/5 ?Cefepime 3/3 >>3/3 ?Cefazolin 3/4>> ? ?Luisa Hart, PharmD, BCPS ?Clinical Pharmacist ?02/03/2022 10:27 AM  ? ?Please refer to [facility] for pharmacy phone number  ? ?

## 2022-02-03 NOTE — Progress Notes (Signed)
?[facility] ?Progress Note  ? ?Subjective:    ?Seen and examined patient at bedside. Patient sitting in recliner. C/o ongoing pain-ordered IV Dilaudid. Tolerated yesterday's HD with UF 560ml. Plan for short HD today to place patient back on usual schedule. ? ?Objective ?Vitals:  ? 02/02/22 1714 02/02/22 2117 02/03/22 0518 02/03/22 0931  ?BP: (!) 168/89 (!) 160/87 (!) 167/87 (!) 160/86  ?Pulse: 99 93 95 94  ?Resp: 17 13 15 19   ?Temp: 99.7 ?F (37.6 ?C) 99.1 ?F (37.3 ?C) 99 ?F (37.2 ?C)   ?TempSrc: Oral Oral Oral   ?SpO2: 98% 100% 100% 95%  ?Weight:      ?Height:      ? ?Physical Exam ?General: NAD ?Heart: S1 and S2; No murmurs, gallops, or rubs ?Lungs: Clear anteriorly ?Abdomen: Soft and non-tender ?Extremities: No edema BLLE; R leg dressing in place; L hand wrapped in dry guaze ?Dialysis Access: L AVF (+) B/T ? ?Filed Weights  ? 01/30/22 1206 02/02/22 0827  ?Weight: 108.9 kg 104.1 kg  ? ? ?Intake/Output Summary (Last 24 hours) at 02/03/2022 1330 ?Last data filed at 02/03/2022 0700 ?Gross per 24 hour  ?Intake 995.36 ml  ?Output --  ?Net 995.36 ml  ? ? ?Additional Objective ?Labs: ?Basic Metabolic Panel: ?Recent Labs  ?Lab 01/31/22 ?0416 02/02/22 ?0904 02/03/22 ?0429  ?NA 136 136 137  ?K 3.2* 3.7 3.9  ?CL 97* 97* 96*  ?CO2 22 20* 24  ?GLUCOSE 90 102* 90  ?BUN 51* 91* 55*  ?CREATININE 10.24* 15.65* 10.14*  ?CALCIUM 8.6* 8.4* 8.9  ?PHOS 5.3* 10.8* 8.8*  ? ?Liver Function Tests: ?Recent Labs  ?Lab 01/31/22 ?0416 02/02/22 ?0904 02/03/22 ?0429  ?ALBUMIN 2.2* 1.8* 1.9*  ? ?No results for input(s): LIPASE, AMYLASE in the last 168 hours. ?CBC: ?Recent Labs  ?Lab 01/28/22 ?2348 01/29/22 ?1315 01/31/22 ?8850 01/31/22 ?1113 02/02/22 ?2774 02/02/22 ?1623 02/03/22 ?0429  ?WBC 13.3*   < > 15.2* 13.2* 9.5 9.9 11.3*  ?NEUTROABS 11.9*  --   --  11.0*  --  8.0*  --   ?HGB 7.4*   < > 6.6* 7.4* 6.4* 9.0* 9.1*  ?HCT 22.6*   < > 19.7* 21.3* 20.1* 26.1* 26.8*  ?MCV 92.2   < > 90.8 86.9 91.0 84.5 85.9  ?PLT 174   < > 163 165 205  206 226  ? < > = values in this interval not displayed.  ? ?Blood Culture ?   ?Component Value Date/Time  ? SDES WOUND 01/30/2022 1249  ? SPECREQUEST LEFT WRIST SYNOVIAL FLUID SPEC B 01/30/2022 1249  ? CULT  01/30/2022 1249  ?  MODERATE STREPTOCOCCUS GROUP G ?Beta hemolytic streptococci are predictably susceptible to penicillin and other beta lactams. Susceptibility testing not routinely performed. ?NO ANAEROBES ISOLATED; CULTURE IN PROGRESS FOR 5 DAYS ?  ? REPTSTATUS PENDING 01/30/2022 1249  ? ? ?Cardiac Enzymes: ?No results for input(s): CKTOTAL, CKMB, CKMBINDEX, TROPONINI in the last 168 hours. ?CBG: ?No results for input(s): GLUCAP in the last 168 hours. ?Iron Studies: No results for input(s): IRON, TIBC, TRANSFERRIN, FERRITIN in the last 72 hours. ?Lab Results  ?Component Value Date  ? INR 1.4 (H) 01/30/2022  ? INR 1.0 08/17/2019  ? ?Studies/Results: ?No results found. ? ?Medications: ? sodium chloride    ? sodium chloride    ? sodium chloride 10 mL/hr at 01/30/22 1700  ?  ceFAZolin (ANCEF) IV 2 g (02/03/22 1209)  ? ? sodium chloride   Intravenous Once  ? sodium chloride  Intravenous Once  ? acetaminophen  1,000 mg Oral Q8H  ? ARIPiprazole  7.5 mg Oral Daily  ? calcium acetate  667 mg Oral TID WC  ? Chlorhexidine Gluconate Cloth  6 each Topical Q0600  ? [START ON 02/07/2022] darbepoetin (ARANESP) injection - DIALYSIS  100 mcg Intravenous Q Sat-HD  ? feeding supplement (NEPRO CARB STEADY)  237 mL Oral BID BM  ? heparin injection (subcutaneous)  5,000 Units Subcutaneous Q8H  ?  HYDROmorphone (DILAUDID) injection  0.5 mg Intravenous Q4H  ? losartan  50 mg Oral Daily  ? multivitamin  1 tablet Oral Daily  ? pantoprazole  40 mg Oral Daily  ? ? ?Dialysis Orders: ?[facility], TTS, 3.5 hs,BFR 350 / Auto flow DFR ?2K, 2.5 CA EDW 110kg ?Heparin 5000 units ?Mircera 100 mcg last given 11/20/01 ?Calcitriol 1.75 mcg p.o. q. HD  ?Venofer 50 MG q. weekly  ?LUA AVF ? ?Assessment/Plan: ?Altered mental status with severe uremia(BUN-158  creatinine 30) 2/2 missed OP HD ,last HD 01/08/2022. Received some serial Hds- mental status improving.  ?ESRD -chronic HD TTS with OP HD noncompliance /a.m. labs as above improved. Tolerated HD yesterday. Plan for short HD tx today to place patient back on schedule. ?Hypertension/volume  -tolerated 1 L UF 3/02 and 1.5 L yesterday ,BP improved with UF and meds ?Right knee pain //infection /status post knee irrigation and debridement 3/04. R knee cx (+) group g streptococcus. WBC elevated-now slowly improving. Ortho and ID following-continue Cefazolin ?Left wrist questionable infection status post I&D by hand surgeon 3/03. Reviewed cultures: (+) Group G streptococcus ?Anemia  -S/p transfuse 1 unit 3/5 for Hgb 6.6. S/p 2 units PRBCs 3/6 for Hgb 6.4. Hgb improved-now 9.1. ESA  recently given 3/4. Avoid iron with febrile infection picture ?Metabolic bone disease -Corr calcium over 10, phos. 10.9 to 8.4. Binders resumed today. Continue to hold vitamin D for now until CA improved ?Nutrition -albumin 2.2 protein supplements with renal failure diet renal vitamin when taking p.o.'s ?History of borderline personality disorder, major depressive order, generalized anxiety/last reported homeless= plan per admit team ? ?Tobie Poet, NP ?[facility] ?02/03/2022,1:30 PM ? LOS: 5 days  ?  ?

## 2022-02-03 NOTE — TOC Initial Note (Signed)
Transition of Care (TOC) - Initial/Assessment Note  ? ? ?Patient Details  ?Name: Natasha Long ?MRN: [id number] ?Date of Birth: 10-25-1996 ? ?Transition of Care (TOC) CM/SW Contact:    ?Paulene Floor Gene Colee, LCSWA ?Phone Number: ?02/03/2022, 3:45 PM ? ?Clinical Narrative:                 ?CSW received consult for housing insecurity and met with the patient at bedside.  The patient reports that prior to this hospitalization patient was living on the street.  Patient reports that she is the "black sheep" in her family and that her family lives in [place].  Patient is not willing to return to [place].  The patient reports that she would be open to SNF should that be [name] recommendation.  The patient states that she is working with [facility]. ? ?CSW met with [facility] leadership and discussed the many barriers to discharging this patient.  It has been requested that the patient be added to the [facility] program.  The patient will be on the waiting list.  ? ?  ?Barriers to Discharge: Continued Medical Work up, Homeless with medical needs ? ? ?Patient Goals and CMS Choice ?  ?  ?  ? ?Expected Discharge Plan and Services ?  ?  ?  ?  ?Living arrangements for the past 2 months: Homeless ?                ?  ?  ?  ?  ?  ?  ?  ?  ?  ?  ? ?Prior Living Arrangements/Services ?Living arrangements for the past 2 months: Homeless ?Lives with:: Other (Comment) ?Patient language and need for interpreter reviewed:: Yes ?       ?Need for Family Participation in Patient Care: Yes (Comment) ?Care giver support system in place?: No (comment) ?  ?Criminal Activity/Legal Involvement Pertinent to Current Situation/Hospitalization: No - Comment as needed ? ?Activities of Daily Living ?Home Assistive Devices/Equipment: None ?ADL Screening (condition at time of admission) ?Patient's cognitive ability adequate to safely complete daily activities?: Yes ?Is the patient deaf or have difficulty hearing?: No ?Does the patient have difficulty  seeing, even when wearing glasses/contacts?: No ?Does the patient have difficulty concentrating, remembering, or making decisions?: No ?Patient able to express need for assistance with ADLs?: Yes ?Does the patient have difficulty dressing or bathing?: No ?Independently performs ADLs?: Yes (appropriate for developmental age) ?Does the patient have difficulty walking or climbing stairs?: No ?Weakness of Legs: Right ?Weakness of Arms/Hands: None ? ?Permission Sought/Granted ?  ?Permission granted to share information with : Yes, Verbal Permission Granted ?   ? Permission granted to share info w AGENCY: Facilities if physical therapy recommends ?   ?   ? ?Emotional Assessment ?Appearance:: Appears older than stated age ?Attitude/Demeanor/Rapport: Engaged ?Affect (typically observed): Appropriate ?Orientation: : Oriented to Situation, Oriented to  Time, Oriented to Place, Oriented to Self ?Alcohol / Substance Use: Illicit Drugs ?Psych Involvement: No (comment) ? ?Admission diagnosis:  End stage renal disease ([place]) [N18.6] ?Dialysis patient, noncompliant ([place]) [Z91.15] ?Effusion of right knee [M25.461] ?Knee pain, acute [M25.569] ?Osteoarthritis of right knee, unspecified osteoarthritis type [M17.11] ?Patient Active Problem List  ? Diagnosis Date Noted  ? Septic arthritis of multiple joints ([place]) 02/01/2022  ? Group G streptococcal infection 02/01/2022  ? Left wrist pain   ? Hyperkalemia 01/30/2022  ? Effusion of right knee   ? Arthritis, septic ([place])   ? Dialysis patient, noncompliant ([place])   ?  Knee pain, acute 01/29/2022  ? Hypokalemia 01/29/2022  ? Hyperphosphatemia 01/29/2022  ? Chest pain   ? Hypervolemia associated with renal insufficiency 12/23/2021  ? Elevated troponin 12/23/2021  ? Tobacco use 08/11/2021  ? Nipple discharge 08/11/2021  ? Breast pain 08/11/2021  ? Exposure to sexually transmitted disease (STD) 08/11/2021  ? Complication of vascular dialysis catheter 02/12/2021  ? Moderate protein-calorie  malnutrition ([place]) 01/27/2021  ? Allergy, unspecified, initial encounter 01/23/2021  ? Anemia in chronic kidney disease 01/23/2021  ? ESRD on dialysis [facility]) 01/23/2021  ? Iron deficiency anemia, unspecified 01/23/2021  ? Other specified coagulation defects ([place]) 01/23/2021  ? Secondary hyperparathyroidism of renal origin ([place]) 01/23/2021  ? Anemia   ? Suicidal ideations   ? Influenza vaccine refused 12/19/2020  ? Morbid obesity ([place]) 01/30/2020  ? 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 Cocaine use, 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[place]) 01/30/2020  ? Affective psychosis, bipolar ([place]) 06/27/2019  ? Borderline personality disorder ([place]) 11/16/2018  ? Moderate cannabis use disorder ([place]) 11/16/2018  ? Severe recurrent major depression without psychotic features ([place]) 11/15/2018  ? Chronic hypertension 04/04/2018  ? PTSD (post-traumatic stress disorder) 04/01/2018  ? ?PCP:  [name], MD ?Pharmacy:   ?[facility] [place], [place] - [address] ?[address] ?[place] [address] ?Phone: [phone] Fax: [phone] ? ?[facility] ?[address] ?[place] [address] ?Phone: [phone] Fax: [phone] ? ? ? ? ?Social Determinants of Health (SDOH) Interventions ?  ? ?Readmission Risk Interventions ?No flowsheet data found. ? ? ?

## 2022-02-03 NOTE — Progress Notes (Signed)
? ?Subjective:  ? ?O/N: No acute overnight events. ? ?Evaluated Ms. Delagarza at bedside.  She is more responsive and interactive today.  Still complaining of pain in her right knee and left wrist. ? ?Pain is 9/10 right now, after dilaudid it is usually around 4/10. Discussed that pain will not be able to go to 0/10 given recent I&Ds, which she understands. She is comfortable with pain level of 4/10. Discussed plan to schedule dilaudid, which she is agreeable with.  ? ?Worked with PT, able to get in chair.  ? ? ?Objective: ? ?Vital signs in last 24 hours: ?Vitals:  ? 02/02/22 1714 02/02/22 2117 02/03/22 0518 02/03/22 0931  ?BP: (!) 168/89 (!) 160/87 (!) 167/87 (!) 160/86  ?Pulse: 99 93 95 94  ?Resp: 17 13 15 19   ?Temp: 99.7 ?F (37.6 ?C) 99.1 ?F (37.3 ?C) 99 ?F (37.2 ?C)   ?TempSrc: Oral Oral Oral   ?SpO2: 98% 100% 100% 95%  ?Weight:      ?Height:      ? ?Physical Exam ?General: Damon female, sitting in chair, NAD. ?HEENT: Splitting of middle of lower lip, healing. ?CV: Normal rate and regular rhythm. ?Pulm: Normal work of breathing on room air. ?MSK: Right knee wrapped in bandage, tender to palpation.  Left wrist with dressings in place, warmth noted, pain with passive ROM.  Right lower extremity below knee warm to touch.  Passive ROM intact in bilateral ankles, elbows, right wrist. ?Neuro: AAOx3, no focal deficits noted. ? ?Assessment/Plan: ? ?Principal Problem: ?  Septic arthritis of multiple joints ([place]) ?Active Problems: ?  ESRD on dialysis [facility]) ?  Knee pain, acute ?  Hyperphosphatemia ?  Effusion of right knee ?  Hyperkalemia ?  Arthritis, septic ([place]) ?  Dialysis patient, noncompliant ([place]) ?  Left wrist pain ?  Group G streptococcal infection ? ?Venisa is a 26 year old female with a history of nephrotic syndrome on hemodialysis who has been noncompliant with sessions for the past 2.5 weeks, borderline personality disorder, and recent homelessness who presented with traumatic knee injury after falling down  a flight of stairs, found to have polyarticular septic arthritis. ? ?Acute traumatic knee injury ?Multifocal septic arthritis ?Afebrile overnight, mild leukocytosis noted today.  Postop day 4 after I&D of right knee and left wrist by Ortho, appreciate assistance.  Right knee and left wrist cultures showing group G Streptococcus, awaiting sensitivities.  Plan for cefazolin x6 weeks per ID.  Per Ortho, patient is weightbearing as tolerated. ?-Appreciate Ortho and ID assistance ?-IV cefazolin, can narrow further pending sensitivities ?-Blood cultures no growth for past 3 days ?-Tylenol every 8 hours scheduled ?-Increased Dilaudid to 1 mg every 4 scheduled, avoid further as needed opiate medications ?-Trend CBC ?-Continue working with PT OT, weightbearing as tolerated ? ?ESRD with HD noncompliance ?Hyperphosphatemia 2/2 missed HD ?Hyperkalemia 2/2 missed HD, resolved  ?Patient reports history of nephrotic syndrome.  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  Patient reports noncompliance with hemodialysis due to being unhomed. Multiple presentations at multiple ED's after missing HD. ?-- appreciate nephrology assistance ?- Received short course of HD today to return patient to schedule ?- Trend renal function panel daily ? ?Anemia of ESRD ?Acute blood loss anemia ?Status post 3 units PRBCs.  She remains asymptomatic from this.  Hemoglobin stable at 9.1. ?-Aranesp 100 mg with HD per nephro ?-Trend CBC, transfuse if hemoglobin less than 7 ?  ?History of borderline personality disorder ?Major depressive disorder ?Generalized anxiety disorder ?Follows at [facility] care.  Home medications: Abilify 10 mg (patient-reported dose, 7.5 documented), gabapentin 300 mg 3 times daily, and hydroxyzine 25 mg 3 times daily as needed. ?- Continue home Abilify 7.5 mg and hydroxyzine 25 mg PRN anxiety ?- TOC consult placed for unhomed status ?  ?Hypertension ?Patient non-adherent with home medications. Improved with  HD and home losartan.  ?-- Continue home Losartan 50 mg daily ?-- Monitor daily vitals ? ?Prior to Admission Living Arrangement: Unhomed, awaiting housing at [employer]. ?Anticipated Discharge Location: Pending ?Barriers to Discharge: Unhomed status, continued medical management ?Dispo: To be decided.  ? ?Virl Axe, MD ?02/03/2022, 3:22 PM ?Pager: [phone] ?After 5pm on weekdays and 1pm on weekends: On Call pager [phone]  ?

## 2022-02-03 NOTE — Progress Notes (Signed)
Patient refused AM dressing change, education provided. ?

## 2022-02-03 NOTE — Progress Notes (Signed)
Physical Therapy Treatment ?Patient Details ?Name: Natasha Long ?MRN: [id number] ?DOB: 1996-02-01 ?Today's Date: 02/03/2022 ? ? ?History of Present Illness 26 y.o. female presented to ED 01/28/22 for pain in right knee which occurred after she twisted it when she stumbled, after twisting her left ankle.  Right knee x-ray, CT right knee-degenerative joint present, knee effusion present, no fracture present. Needs dialysis after not presenting for dialysis x 2 weeks. 01/30/22 Rt knee aspiration and injection; 01/30/22 due to septic joint underwent Rt knee extensive synovectomy and later meniscal debridement; also developed left wrist pain and had arthrotomy for irrigation due to infection;    PMH nephrotic syndrome now on HD TThS, HTN, diastolic heart failure, borderline personality disorder, generalized anxiety, major depressive disorder, and recent homelessness ? ?  ?PT Comments  ? ? Continuing work on functional mobility and activity tolerance;  Dovetailed in at the end of OT session to help with bed mobility; Much better ability to participate, takes time and gentle encouragement; very anxious with the anticipation of pain; Gently worked on R knee ROM in sitting, flexed to approx 50 deg; Able to quad set; Needs Max assist to try straight leg raise; Able to stand to L platform RW with Mod assist, and took pivot steps bed to recliner with support from L paltform RW; essentially keeping TWB RLE in standing, but able to get heel down with encouragement;  ? ?Used yellow "egg crate" padding from PACU to pad L platform for more comfort; Once in reliner, took extra time to position pt as comfortably as possible; Briefly explained to pt the need to acheive full knee extension as soon as possible, and that optimal positioning at rest is without anything under her knee (even with her ankle propped) to encourage full extension; She will need reinforcement of this concept;  ? ?**Highly recommend pt undergo HD in HD recliner** ?   ?Recommendations for follow up therapy are one component of a multi-disciplinary discharge planning process, led by the attending physician.  Recommendations may be updated based on patient status, additional functional criteria and insurance authorization. ? ?Follow Up Recommendations ? SNF vs HHPT (at shelter?) vs Outpatient PT (Will depend on progress; She is young and she will have good rehab potential when she is better able to participate) ?  ?  ?Assistance Recommended at Discharge Intermittent Supervision/Assistance  ?Patient can return home with the following Assistance with cooking/housework;Assist for transportation ?  ?Equipment Recommendations ?  (Will start with L platform RW, and update as able)  ?  ?Recommendations for Other Services   ? ? ?  ?Precautions / Restrictions Precautions ?Precautions: Fall ?Precaution Comments: L wrist splint for comfort; L wrist/digit ROM ok ?Restrictions ?Weight Bearing Restrictions: No ?RLE Weight Bearing: Weight bearing as tolerated ?Other Position/Activity Restrictions: Encourage R knee ROM as tolerated  ?  ? ?Mobility ? Bed Mobility ?Overal bed mobility: Needs Assistance ?Bed Mobility: Supine to Sit ?  ?  ?Supine to sit: HOB elevated, Mod assist, +2 for physical assistance ?  ?  ?General bed mobility comments: VCs for sequencing with A to hold the weight of RLE as she moved it and A with trunk up 1/2 way by hooking arms with her while I also supported her RLE. Once at EOB (A with pad) able to slowly lower RLE to ground. Using the bed pad was helpful in squaring off hips at EOB ?  ? ?Transfers ?Overall transfer level: Needs assistance ?Equipment used: Left platform walker ?Transfers: Sit to/from Stand, Bed  to chair/wheelchair/BSC ?Sit to Stand: Mod assist, +2 safety/equipment, From elevated surface ?  ?Step pivot transfers: Min assist, +2 safety/equipment ?  ?  ?  ?General transfer comment: Mod assist with sit to stand mostly to steady platform RW as pt pulled up with  R hand; Took extra time to problem-solve through LUE placement, ultimately opting ot place LUE up on platform first; took small pivotal steps with good support through L ebow and RUE on walker; essentially maintains TWB RLE due to pain today ?  ? ?Ambulation/Gait ?  ?  ?  ?  ?  ?  ?  ?  ? ? ?Stairs ?  ?  ?  ?  ?  ? ? ?Wheelchair Mobility ?  ? ?Modified Rankin (Stroke Patients Only) ?  ? ? ?  ?Balance Overall balance assessment: Needs assistance ?Sitting-balance support: Feet supported, No upper extremity supported ?Sitting balance-Leahy Scale: Good ?  ?  ?  ?Standing balance-Leahy Scale: Poor ?Standing balance comment: Needs bil UE support on L platform RW in standing ?  ?  ?  ?  ?  ?  ?  ?  ?  ?  ?  ?  ? ?  ?Cognition Arousal/Alertness: Awake/alert ?Behavior During Therapy: Flat affect ?Overall Cognitive Status: Within Functional Limits for tasks assessed ?  ?  ?  ?  ?  ?  ?  ?  ?  ?  ?  ?  ?  ?  ?  ?  ?General Comments: does get frustrated easily ?  ?  ? ?  ?Exercises   ? ?  ?General Comments General comments (skin integrity, edema, etc.): Used yellow "egg crate" padding from PACU to pad L platform for more comfort; Once in reliner, took extra time to position pt as comfortably as possible; Briefly explained to pt the need to acheive full knee extension as soon as possible, and that optimal positioning at rest is without anything under her knee (even with her ankle propped) to encourage full extension; She will need reinforcement of this concept ?  ?  ? ?Pertinent Vitals/Pain Pain Assessment ?Pain Assessment: 0-10 ?Faces Pain Scale: Hurts whole lot ?Pain Location: RLE with getting to EOB and when pillow was removed from under it ?Pain Descriptors / Indicators: Grimacing, Guarding, Sore ?Pain Intervention(s): Monitored during session, Repositioned, Ice applied  ? ? ?Home Living   ?  ?  ?  ?  ?  ?  ?  ?  ?  ?   ?  ?Prior Function    ?  ?  ?   ? ?PT Goals (current goals can now be found in the care plan section)  Acute Rehab PT Goals ?Patient Stated Goal: Did not state a goal ?PT Goal Formulation: Patient unable to participate in goal setting (Seemed overwhelmed by the end of session, and unable to discuss plans) ?Time For Goal Achievement: 02/15/22 ?Potential to Achieve Goals: Fair (but improving) ?Progress towards PT goals: Progressing toward goals (Slowly) ? ?  ?Frequency ? ? ? Min 5X/week ? ? ? ?  ?PT Plan Current plan remains appropriate;Frequency needs to be updated (better participation)  ? ? ?Co-evaluation   ?  ?  ?  ?  ? ?  ?AM-PAC PT "6 Clicks" Mobility   ?Outcome Measure ? Help needed turning from your back to your side while in a flat bed without using bedrails?: A Little ?Help needed moving from lying on your back to sitting on the side of a flat  bed without using bedrails?: A Lot ?Help needed moving to and from a bed to a chair (including a wheelchair)?: A Lot ?Help needed standing up from a chair using your arms (e.g., wheelchair or bedside chair)?: A Lot ?Help needed to walk in hospital room?: A Lot ?Help needed climbing 3-5 steps with a railing? : Total ?6 Click Score: 12 ? ?  ?End of Session Equipment Utilized During Treatment: Gait belt ?Activity Tolerance: Patient limited by pain (But still participating today) ?Patient left: in chair;with call bell/phone within reach ?Nurse Communication: Mobility status ?PT Visit Diagnosis: Other abnormalities of gait and mobility (R26.89);Pain ?Pain - Right/Left:  (R knee, L wrist and hand) ?Pain - part of body:  (R knee, L wrist and hand) ?  ? ? ?Time: 8628-2417 ?PT Time Calculation (min) (ACUTE ONLY): 35 min ? ?Charges:  $Therapeutic Activity: 23-37 mins          ?          ? ?Roney Marion, PT  ?Acute Rehabilitation Services ?Pager [phone] ?Office [phone] ? ? ? ?[name] ?02/03/2022, 12:34 PM ? ?

## 2022-02-03 NOTE — Progress Notes (Signed)
Occupational Therapy Treatment ?Patient Details ?Name: Natasha Long ?MRN: [id number] ?DOB: February 12, 1996 ?Today's Date: 02/03/2022 ? ? ?History of present illness 26 y.o. female presented to ED 01/28/22 for pain in right knee which occurred after she twisted it when she stumbled, after twisting her left ankle.  Right knee x-ray, CT right knee-degenerative joint present, knee effusion present, no fracture present. Needs dialysis after not presenting for dialysis x 2 weeks. 01/30/22 Rt knee aspiration and injection; 01/30/22 due to septic joint underwent Rt knee extensive synovectomy and later meniscal debridement; also developed left wrist pain and had arthrotomy for irrigation due to infection;    PMH nephrotic syndrome now on HD TThS, HTN, diastolic heart failure, borderline personality disorder, generalized anxiety, major depressive disorder, and recent homelessness ?  ?OT comments ? This 26 yo female doing better overall today with mobility as long as cued and given increased time to move. Pain in RLE and LUE still and issue but she was given IV pain meds by RN before we started so she was better able to work through the pain while moving. She will continue to benefit from acute OT with follow up OT at SNF.  ? ?Recommendations for follow up therapy are one component of a multi-disciplinary discharge planning process, led by the attending physician.  Recommendations may be updated based on patient status, additional functional criteria and insurance authorization. ?   ?Follow Up Recommendations ? Skilled nursing-short term rehab (<3 hours/day)  ?  ?Assistance Recommended at Discharge Frequent or constant Supervision/Assistance  ?Patient can return home with the following ? Two people to help with walking and/or transfers;A lot of help with bathing/dressing/bathroom;Help with stairs or ramp for entrance;Assist for transportation;Assistance with cooking/housework ?  ?Equipment Recommendations ? Other (comment) (TBD next  venue)  ?  ?   ?Precautions / Restrictions Precautions ?Precautions: Fall ?Precaution Comments: L wrist splint for comfort; L wrist/digit ROM ok ?Restrictions ?Weight Bearing Restrictions: No ?RLE Weight Bearing: Weight bearing as tolerated  ? ? ?  ? ?Mobility Bed Mobility ?Overal bed mobility: Needs Assistance ?Bed Mobility: Supine to Sit ?  ?  ?Supine to sit: HOB elevated, Mod assist ?  ?  ?General bed mobility comments: VCs for sequencing with A to hold the weight of RLE as she moved it and A with trunk up 1/2 way by hooking arms with her while I also supported her RLE. Once at EOB (A with pad) able to slowly lower RLE to ground. ?  ? ? ?  ?Balance Overall balance assessment: Needs assistance ?Sitting-balance support: Feet supported, No upper extremity supported ?Sitting balance-Leahy Scale: Good ?  ?  ?  ?  ?  ?  ?  ?  ?  ?  ?  ?  ?  ?  ?  ?  ?   ? ? ? ? ?Extremity/Trunk Assessment Upper Extremity Assessment ?LUE Deficits / Details: Can wiggle digits and move rest of arm WNL except for wrist and forearm limited by pain so does not want to do these motions ?  ?  ?  ?  ?  ? ?Vision Ability to See in Adequate Light: 0 Adequate ?  ?  ?   ?   ? ?Cognition Arousal/Alertness: Awake/alert ?Behavior During Therapy: Flat affect ?Overall Cognitive Status: Within Functional Limits for tasks assessed ?  ?  ?  ?  ?  ?  ?  ?  ?  ?  ?  ?  ?  ?  ?  ?  ?  General Comments: does get frustrated easily ?  ?  ?   ?Exercises Other Exercises ?Other Exercises: Once I was able to slowly raise her RLE with her A as well and get pillow out from under knee then had to cue her to do a knee press to try and help the pain she was feeling from not having the pillow under her knee anymore--this really helped her. Encouraged her to not have the pillow under her knee due to when she walks she will have a hard time straightening the knee and will walk with a limp. ? ?  ?   ?   ? ? ?Pertinent Vitals/ Pain       Pain Assessment ?Pain Assessment:  Faces ?Faces Pain Scale: Hurts even more ?Pain Location: RLE with getting to EOB and when pillow was removed from under it ?Pain Descriptors / Indicators: Grimacing, Guarding, Sore ?Pain Intervention(s): Limited activity within patient's tolerance, Monitored during session, Premedicated before session, Repositioned ? ?   ?   ? ?Frequency ? Min 2X/week  ? ? ? ? ?  ?Progress Toward Goals ? ?OT Goals(current goals can now be found in the care plan section) ? Progress towards OT goals: Progressing toward goals ? ?Acute Rehab OT Goals ?Patient Stated Goal: to not be rushed ?OT Goal Formulation: With patient ?Time For Goal Achievement: 02/14/22 ?Potential to Achieve Goals: Good  ?Plan Discharge plan remains appropriate   ? ?   ?AM-PAC OT "6 Clicks" Daily Activity     ?Outcome Measure ? ? Help from another person eating meals?: A Little (setup) ?Help from another person taking care of personal grooming?: A Little (setup) ?Help from another person toileting, which includes using toliet, bedpan, or urinal?: Total ?Help from another person bathing (including washing, rinsing, drying)?: A Lot ?Help from another person to put on and taking off regular upper body clothing?: A Lot ?Help from another person to put on and taking off regular lower body clothing?: Total ?6 Click Score: 12 ? ?  ?End of Session   ? ?OT Visit Diagnosis: Other abnormalities of gait and mobility (R26.89);Muscle weakness (generalized) (M62.81);Pain ?Pain - Right/Left:  (both (left hand, right leg)) ?  ?Activity Tolerance Patient tolerated treatment well (with increased time given) ?  ?Patient Left  (sitting on EOB with PT getting ready to try a PFRW with her) ?  ?Nurse Communication  (sitting on EOB with PT) ?  ? ?   ? ?Time: 1050-1109 ?OT Time Calculation (min): 19 min ? ?Charges: OT General Charges ?$OT Visit: 1 Visit ?OT Treatments ?$Self Care/Home Management : 8-22 mins ? ?Natasha Long, Natasha Long ?Acute Rehab Services ?Pager [phone] ?Office  [phone] ? ? ? ?Almon Register ?02/03/2022, 11:28 AM ?

## 2022-02-03 NOTE — Progress Notes (Addendum)
? ?  Subjective: ? ?WBC is downtrending, remains afrebrile. Not seen by by physical therapy. ? ?Objective:  ? ?VITALS:   ?Vitals:  ? 02/02/22 1221 02/02/22 1714 02/02/22 2117 02/03/22 0518  ?BP: (!) 150/79 (!) 168/89 (!) 160/87 (!) 167/87  ?Pulse: 96 99 93 95  ?Resp: 17 17 13 15   ?Temp: 98.5 ?F (36.9 ?C) 99.7 ?F (37.6 ?C) 99.1 ?F (37.3 ?C) 99 ?F (37.2 ?C)  ?TempSrc: Oral Oral Oral Oral  ?SpO2: 95% 98% 100% 100%  ?Weight:      ?Height:      ? ? ?Right leg with dressing in place. Able to flex to 50 degrees minimal pain. SILT all distributions right foot. 2+ DP pulse ? ?Lab Results  ?Component Value Date  ? WBC 11.3 (H) 02/03/2022  ? HGB 9.1 (L) 02/03/2022  ? HCT 26.8 (L) 02/03/2022  ? MCV 85.9 02/03/2022  ? PLT 226 02/03/2022  ? ? ? ?Assessment/Plan: ? ?4 Days Post-Op right knee irrigation and debridement, I have instructed her on importance of ROM of the knee and mobilization in order to prevent arthrofibrosis and persistent pain. Patient counseled on ROM right knee daily as well as mobilization out of bed. ? ?- Patient to work with PT/OT to optimize mobilization safely daily ?- DVT ppx - SCDs, ambulation, Heparin ?- WBAT operative extremity, ROM as tolerated right leg ?- Recommend every other day CRP while in house as this is a better acute inflammatory measure as opposed to CBC ?- Strep G - Ancef per ID given with Dialysis ?- Discharge planning pending CM, appreciate coordination  ? ? ?Natasha Long ?02/03/2022, 7:45 AM ? ?

## 2022-02-04 ENCOUNTER — Ambulatory Visit: Payer: Medicaid Other | Admitting: Physician Assistant

## 2022-02-04 DIAGNOSIS — M0009 Staphylococcal polyarthritis: Secondary | ICD-10-CM

## 2022-02-04 DIAGNOSIS — B954 Other streptococcus as the cause of diseases classified elsewhere: Secondary | ICD-10-CM | POA: Diagnosis not present

## 2022-02-04 DIAGNOSIS — M0029 Other streptococcal polyarthritis: Secondary | ICD-10-CM | POA: Diagnosis not present

## 2022-02-04 DIAGNOSIS — N186 End stage renal disease: Secondary | ICD-10-CM | POA: Diagnosis not present

## 2022-02-04 LAB — CULTURE, BLOOD (ROUTINE X 2)
Culture: NO GROWTH
Culture: NO GROWTH
Special Requests: ADEQUATE
Special Requests: ADEQUATE

## 2022-02-04 LAB — CBC
HCT: 27.3 % — ABNORMAL LOW (ref 36.0–46.0)
Hemoglobin: 8.9 g/dL — ABNORMAL LOW (ref 12.0–15.0)
MCH: 28.5 pg (ref 26.0–34.0)
MCHC: 32.6 g/dL (ref 30.0–36.0)
MCV: 87.5 fL (ref 80.0–100.0)
Platelets: 266 10*3/uL (ref 150–400)
RBC: 3.12 MIL/uL — ABNORMAL LOW (ref 3.87–5.11)
RDW: 17.9 % — ABNORMAL HIGH (ref 11.5–15.5)
WBC: 11.3 10*3/uL — ABNORMAL HIGH (ref 4.0–10.5)
nRBC: 0 % (ref 0.0–0.2)

## 2022-02-04 LAB — RENAL FUNCTION PANEL
Albumin: 1.9 g/dL — ABNORMAL LOW (ref 3.5–5.0)
Anion gap: 18 — ABNORMAL HIGH (ref 5–15)
BUN: 73 mg/dL — ABNORMAL HIGH (ref 6–20)
CO2: 22 mmol/L (ref 22–32)
Calcium: 8.9 mg/dL (ref 8.9–10.3)
Chloride: 95 mmol/L — ABNORMAL LOW (ref 98–111)
Creatinine, Ser: 12.26 mg/dL — ABNORMAL HIGH (ref 0.44–1.00)
GFR, Estimated: 4 mL/min — ABNORMAL LOW (ref >60)
Glucose, Bld: 97 mg/dL (ref 70–99)
Phosphorus: 10.4 mg/dL — ABNORMAL HIGH (ref 2.5–4.6)
Potassium: 3.9 mmol/L (ref 3.5–5.1)
Sodium: 135 mmol/L (ref 135–145)

## 2022-02-04 LAB — AEROBIC/ANAEROBIC CULTURE W GRAM STAIN (SURGICAL/DEEP WOUND)

## 2022-02-04 MED ORDER — SUCROFERRIC OXYHYDROXIDE 500 MG PO CHEW
1000.0000 mg | CHEWABLE_TABLET | Freq: Three times a day (TID) | ORAL | Status: DC
  Administered 2022-02-04 – 2022-02-14 (×14): 1000 mg via ORAL
  Filled 2022-02-04 (×30): qty 2

## 2022-02-04 MED ORDER — CEFAZOLIN SODIUM-DEXTROSE 2-4 GM/100ML-% IV SOLN
2.0000 g | INTRAVENOUS | Status: DC
  Administered 2022-02-06: 2 g via INTRAVENOUS
  Filled 2022-02-04: qty 100

## 2022-02-04 MED ORDER — CEFAZOLIN SODIUM-DEXTROSE 1-4 GM/50ML-% IV SOLN
1.0000 g | Freq: Once | INTRAVENOUS | Status: AC
  Administered 2022-02-04: 1 g via INTRAVENOUS
  Filled 2022-02-04 (×2): qty 50

## 2022-02-04 MED ORDER — CALCIUM ACETATE (PHOS BINDER) 667 MG PO CAPS: 2001.0000 mg | ORAL_CAPSULE | Freq: Three times a day (TID) | ORAL | Status: DC

## 2022-02-04 NOTE — Progress Notes (Signed)
Patient refusing VS, stating "I'm hurting too bad." This nurse explained importance of getting VS and patient continues to refused and states "I'm only getting it every 4 hours and I'm hurting so bad, I'm not addicted to pain medication." This nurse explained to patient that I had pain medication to give her at that time but patient still refused VS. ?

## 2022-02-04 NOTE — Social Work (Cosign Needed)
Please be advised that the above-named patient will require a short-term nursing home stay-anticipated 30 days or less for rehabilitation and strengthening. The plan is for return home.  

## 2022-02-04 NOTE — [place] FL2 (Signed)
?[place] MEDICAID FL2 LEVEL OF CARE SCREENING TOOL  ?  ? ?IDENTIFICATION  ?Patient Name: ?Natasha Long Birthdate: [birth year] Sex: female Admission Date (Current Location): ?01/28/2022  ?[place] and [place] Number: ? [place] ?  Facility and Address:  ?[facility], [place] [address] [place], [place] [address] ?     Provider Number: ?[id number]  ?Attending Physician Name and Address:  ?Angelica Pou, MD ? Relative Name and Phone Number:  ?  ?   ?Current Level of Care: ?Hospital Recommended Level of Care: ?[place] Prior Approval Number: ?  ? ?Date Approved/Denied: ?  PASRR Number: ?Level 2 ? ?Discharge Plan: ?SNF ?  ? ?Current Diagnoses: ?Patient Active Problem List  ? Diagnosis Date Noted  ? Septic arthritis of multiple joints ([name]) 02/01/2022  ? Group G streptococcal infection 02/01/2022  ? Left wrist pain   ? Hyperkalemia 01/30/2022  ? Effusion of right knee   ? Arthritis, septic ([name])   ? Dialysis patient, noncompliant ([name])   ? Knee pain, acute 01/29/2022  ? Hypokalemia 01/29/2022  ? Hyperphosphatemia 01/29/2022  ? Chest pain   ? Hypervolemia associated with renal insufficiency 12/23/2021  ? Elevated troponin 12/23/2021  ? Tobacco use 08/11/2021  ? Nipple discharge 08/11/2021  ? Breast pain 08/11/2021  ? Exposure to sexually transmitted disease (STD) 08/11/2021  ? Complication of vascular dialysis catheter 02/12/2021  ? Moderate protein-calorie malnutrition ([name]) 01/27/2021  ? Allergy, unspecified, initial encounter 01/23/2021  ? Anemia in chronic kidney disease 01/23/2021  ? End stage renal disease ([name]) 01/23/2021  ? Iron deficiency anemia, unspecified 01/23/2021  ? Other specified coagulation defects ([place]) 01/23/2021  ? Secondary hyperparathyroidism of renal origin ([name]) 01/23/2021  ? Anemia   ? Suicidal ideations   ? Influenza vaccine refused 12/19/2020  ? Morbid obesity ([name]) 01/30/2020  ? 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 Cocaine use, unspecified with  cocaine-induced mood disorder ([place]) 01/30/2020  ? Affective psychosis, bipolar ([place]) 06/27/2019  ? Borderline personality disorder ([name]) 11/16/2018  ? Moderate cannabis use disorder ([name]) 11/16/2018  ? Severe recurrent major depression without psychotic features ([name]) 11/15/2018  ? Chronic hypertension 04/04/2018  ? PTSD (post-traumatic stress disorder) 04/01/2018  ? ? ?Orientation RESPIRATION BLADDER Height & Weight   ?  ?Self, Time, Place, Situation ? Normal Continent Weight: 223 lb 12.3 oz (101.5 kg) ?Height:  5\' 8"  (172.7 cm)  ?BEHAVIORAL SYMPTOMS/MOOD NEUROLOGICAL BOWEL NUTRITION STATUS  ?    Continent Diet (See DC summary)  ?AMBULATORY STATUS COMMUNICATION OF NEEDS Skin   ?Extensive Assist Verbally Surgical wounds (L arm, R knee surgical incision) ?  ?  ?  ?    ?     ?     ? ? ?Personal Care Assistance Level of Assistance  ?Bathing, Feeding, Dressing Bathing Assistance: Limited assistance ?Feeding assistance: Independent ?Dressing Assistance: Limited assistance ?   ? ?Functional Limitations Info  ?Sight, Hearing, Speech Sight Info: Adequate ?Hearing Info: Adequate ?Speech Info: Adequate  ? ? ?SPECIAL CARE FACTORS FREQUENCY  ?PT (By licensed PT), OT (By licensed OT)   ?  ?PT Frequency: 5x week ?OT Frequency: 5x week ?  ?  ?  ?   ? ? ?Contractures Contractures Info: Not present  ? ? ?Additional Factors Info  ?Code Status, Allergies, Psychotropic Code Status Info: Full ?Allergies Info: Prozac (Fluoxetine Hcl)   Wellbutrin (Bupropion)   Prednisone ?Psychotropic Info: Aripirazole ?  ?  ?   ? ?Current Medications (02/04/2022):  This is the current  hospital active medication list ?Current Facility-Administered Medications  ?Medication Dose Route Frequency Provider Last Rate Last Admin  ? 0.9 %  sodium chloride infusion (Manually program via Guardrails IV Fluids)   Intravenous Once Roney Jaffe, MD      ? 0.9 %  sodium chloride infusion (Manually program via Guardrails IV Fluids)   Intravenous Once Virl Axe, MD       ? 0.9 %  sodium chloride infusion  100 mL Intravenous PRN Milly Jakob, MD      ? 0.9 %  sodium chloride infusion  100 mL Intravenous PRN Milly Jakob, MD      ? 0.9 %  sodium chloride infusion   Intravenous Continuous Milly Jakob, MD 10 mL/hr at 01/30/22 1700 Continued from Pre-op at 01/30/22 1700  ? acetaminophen (TYLENOL) tablet 1,000 mg  1,000 mg Oral Q8H Milly Jakob, MD   1,000 mg at 02/04/22 1504  ? ARIPiprazole (ABILIFY) tablet 7.5 mg  7.5 mg Oral Daily Milly Jakob, MD   7.5 mg at 02/04/22 1235  ? [START ON 02/06/2022] ceFAZolin (ANCEF) IVPB 2g/100 mL premix  2 g Intravenous Q M,W,F-HD Wendee Beavers, RPH      ? Chlorhexidine Gluconate Cloth 2 % PADS 6 each  6 each Topical Q0600 Milly Jakob, MD   6 each at 02/04/22 0630  ? [START ON 02/07/2022] Darbepoetin Alfa (ARANESP) injection 100 mcg  100 mcg Intravenous Q Sat-HD Angelica Pou, MD      ? feeding supplement (NEPRO CARB STEADY) liquid 237 mL  237 mL Oral BID BM Milly Jakob, MD   237 mL at 02/03/22 1513  ? heparin injection 1,000 Units  1,000 Units Dialysis PRN Milly Jakob, MD      ? heparin injection 5,000 Units  5,000 Units Subcutaneous Q8H Milly Jakob, MD   5,000 Units at 02/04/22 [phone]  ? HYDROmorphone (DILAUDID) injection 1 mg  1 mg Intravenous Q4H Virl Axe, MD   1 mg at 02/04/22 1235  ? hydrOXYzine (ATARAX) tablet 25 mg  25 mg Oral Q8H PRN Milly Jakob, MD   25 mg at 02/04/22 1235  ? lidocaine (PF) (XYLOCAINE) 1 % injection 5 mL  5 mL Intradermal PRN Milly Jakob, MD      ? lidocaine-prilocaine (EMLA) cream 1 application  1 application. Topical PRN Milly Jakob, MD      ? losartan (COZAAR) tablet 50 mg  50 mg Oral Daily Milly Jakob, MD   50 mg at 02/04/22 1235  ? multivitamin (RENA-VIT) tablet 1 tablet  1 tablet Oral Daily Milly Jakob, MD   1 tablet at 02/04/22 1235  ? pantoprazole (PROTONIX) EC tablet 40 mg  40 mg Oral Daily Milly Jakob, MD   40 mg at 02/04/22 1235  ?  pentafluoroprop-tetrafluoroeth (GEBAUERS) aerosol 1 application  1 application. Topical PRN Milly Jakob, MD      ? sucroferric oxyhydroxide [facility]) chewable tablet 1,000 mg  1,000 mg Oral TID WC Adelfa Koh, NP      ? white petrolatum (VASELINE) gel   Topical PRN Angelica Pou, MD      ? ? ? ?Discharge Medications: ?Please see discharge summary for a list of discharge medications. ? ?Relevant Imaging Results: ? ?Relevant Lab Results: ? ? ?Additional Information ?SS# [phone] [id number] ? ?Coralee Pesa, LCSWA ? ? ? ? ?

## 2022-02-04 NOTE — Progress Notes (Signed)
Patient arrived back to unit from hemodialysis, alert and oriented. VS obtained, patient c/o pain 9/10 to right knee, to receive scheduled Dilaudid. Purewick in place, call bell and telephone within reach. ?

## 2022-02-04 NOTE — Progress Notes (Signed)
Patient refused telemonitoring, per pt. She is in so much pain right now, dont want to be bothered. ?

## 2022-02-04 NOTE — Progress Notes (Signed)
? ?Subjective:  ? ?O/N: No acute overnight events. ? ?Evaluated Natasha Long in HD.  She remains responsive and interactive. Still complaining of pain in her right knee and left wrist. ? ?Pain is 10/10 right now, after dilaudid it is usually around 3/10. She has no further complaints today and is agreeable to continue working with PT/OT. ? ? ?Objective: ? ?Vital signs in last 24 hours: ?Vitals:  ? 02/03/22 0931 02/03/22 1624 02/03/22 2106 02/04/22 0452  ?BP: (!) 160/86 (!) 156/94 140/81 (!) 150/87  ?Pulse: 94 97 91 90  ?Resp: 19 19 18 20   ?Temp:  99.1 ?F (37.3 ?C) 99.4 ?F (37.4 ?C) 98.4 ?F (36.9 ?C)  ?TempSrc:  Oral Oral Oral  ?SpO2: 95% 94% 100% 99%  ?Weight:      ?Height:      ? ?Physical Exam ?General: Natasha Long female, lying in bed in HD, in pain but NAD. ?HEENT: Splitting of middle of lower lip, healed. ?CV: Normal rate and regular rhythm on telemetry ?Pulm: Normal work of breathing on room air. ?MSK: Right knee wrapped in bandage, tender to palpation and less warm in comparison to LLE.  Left wrist with dressings in place, warmth noted, pain with passive ROM. Passive ROM intact in bilateral ankles, elbows, right wrist. ?Neuro: AAOx3, no focal deficits noted. ? ?Assessment/Plan: ? ?Principal Problem: ?  Septic arthritis of multiple joints ([place]) ?Active Problems: ?  ESRD on dialysis [facility]) ?  Knee pain, acute ?  Hyperphosphatemia ?  Effusion of right knee ?  Hyperkalemia ?  Arthritis, septic ([place]) ?  Dialysis patient, noncompliant ([place]) ?  Left wrist pain ?  Group G streptococcal infection ? ?Natasha Long is a 26 year old female with a history of nephrotic syndrome on hemodialysis who has been noncompliant with sessions for the past 2.5 weeks, borderline personality disorder, and recent homelessness who presented with traumatic knee injury after falling down a flight of stairs, found to have polyarticular septic arthritis. ? ?Acute traumatic knee injury ?Multifocal septic arthritis ?Afebrile overnight, mild leukocytosis  noted today.  Postop day 5 after I&D of right knee and left wrist by Ortho, appreciate assistance.  Right knee and left wrist cultures showing group G Streptococcus, awaiting sensitivities.  Plan for cefazolin x6 weeks per ID.  Per Ortho, patient is weightbearing as tolerated. ?-Appreciate Ortho and ID assistance ?-IV cefazolin, can narrow further pending sensitivities ?-Blood cultures no growth x 5 days ?-Tylenol every 8 hours scheduled ?-Continue Dilaudid to 1 mg every 4 scheduled, avoid further as needed opiate medications ?-Trend CBC ?-Continue working with PT OT, weightbearing as tolerated ? ?ESRD with HD noncompliance ?Hyperphosphatemia 2/2 missed HD ?Hyperkalemia 2/2 missed HD, resolved  ?Patient reports history of nephrotic syndrome.  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  Patient reports noncompliance with hemodialysis due to being unhomed. Multiple presentations at multiple ED's after missing HD. ?-- appreciate nephrology assistance ?- Received short course of HD again today ?- Trend renal function panel daily ? ?Anemia of ESRD ?Acute blood loss anemia ?Status post 3 units PRBCs.  She remains asymptomatic from this.  Hemoglobin stable at 8.9. ?-Aranesp 100 mg with HD per nephro ?-Trend CBC, transfuse if hemoglobin less than 7 ?  ?History of borderline personality disorder ?Major depressive disorder ?Generalized anxiety disorder ?Follows at [facility] for care.  Home medications: Abilify 10 mg (patient-reported dose, 7.5 documented), gabapentin 300 mg 3 times daily, and hydroxyzine 25 mg 3 times daily as needed. ?- Continue home Abilify 7.5 mg and hydroxyzine 25  mg PRN anxiety ?- TOC consult for unhomed status; appreciate assistance ?  ?Hypertension ?Patient non-adherent with home medications. Improved with HD and home losartan.  ?-- Continue home Losartan 50 mg daily ?-- Monitor daily vitals ? ?Prior to Admission Living Arrangement: Unhomed, awaiting housing at [employer]. ?Anticipated Discharge Location: Pending ?Barriers to Discharge: Unhomed status, continued medical management ?Dispo: To be decided.  ? ?[name], MD ?02/04/2022, 6:21 AM ?Pager: [phone] ?After 5pm on weekdays and 1pm on weekends: On Call pager [phone]  ?

## 2022-02-04 NOTE — Progress Notes (Signed)
Round with patient today to follow-up on concerns voiced to Hemodialysis staff. Concerns to be escalated to nephrology leadership. ? ?Dorthey Sawyer, RN  ?Dialysis Nurse Coordinator ?[phone] ?

## 2022-02-04 NOTE — Progress Notes (Signed)
? ?  Subjective: ? ?WBC sideways. Having difficulty with PT, moving her knee as a result of pain. ? ?Objective:  ? ?VITALS:   ?Vitals:  ? 02/03/22 0931 02/03/22 1624 02/03/22 2106 02/04/22 0452  ?BP: (!) 160/86 (!) 156/94 140/81 (!) 150/87  ?Pulse: 94 97 91 90  ?Resp: 19 19 18 20   ?Temp:  99.1 ?F (37.3 ?C) 99.4 ?F (37.4 ?C) 98.4 ?F (36.9 ?C)  ?TempSrc:  Oral Oral Oral  ?SpO2: 95% 94% 100% 99%  ?Weight:      ?Height:      ? ? ?Right leg with dressing in place. Able to flex to 50 degrees minimal pain. SILT all distributions right foot. 2+ DP pulse ? ?Lab Results  ?Component Value Date  ? WBC 11.3 (H) 02/04/2022  ? HGB 8.9 (L) 02/04/2022  ? HCT 27.3 (L) 02/04/2022  ? MCV 87.5 02/04/2022  ? PLT 266 02/04/2022  ? ? ? ?Assessment/Plan: ? ?5 Days Post-Op right knee irrigation and debridement, I have instructed her on importance of ROM of the knee and mobilization in order to prevent arthrofibrosis and persistent pain. Patient counseled on ROM right knee daily as well as mobilization out of bed. ? ?- Patient to work with PT/OT to optimize mobilization safely daily ?- DVT ppx - SCDs, ambulation, Heparin ?- WBAT operative extremity, ROM as tolerated right leg ?- Recommend every other day CRP while in house as this is a better acute inflammatory measure as opposed to CBC ?- Strep G - Ancef per ID given with Dialysis ?- Discharge planning pending CM, appreciate coordination  ? ? ?Natasha Long ?02/04/2022, 6:25 AM ? ?

## 2022-02-04 NOTE — Progress Notes (Signed)
Physical Therapy Treatment ?Patient Details ?Name: Luria Rosario ?MRN: [id number] ?DOB: February 25, 1996 ?Today's Date: 02/04/2022 ? ? ?History of Present Illness 26 y.o. female presented to ED 01/28/22 for pain in right knee which occurred after she twisted it when she stumbled, after twisting her left ankle.  Right knee x-ray, CT right knee-degenerative joint present, knee effusion present, no fracture present. Needs dialysis after not presenting for dialysis x 2 weeks. 01/30/22 Rt knee aspiration and injection; 01/30/22 due to septic joint underwent Rt knee extensive synovectomy and later meniscal debridement; also developed left wrist pain and had arthrotomy for irrigation due to infection;    PMH nephrotic syndrome now on HD TThS, HTN, diastolic heart failure, borderline personality disorder, generalized anxiety, major depressive disorder, and recent homelessness ? ?  ?PT Comments  ? ? Continuing work on functional mobility and activity tolerance;  Premedicated for pain prior to PT session; Unfortunately, Milee was having a difficult time participating this afternoon, and adamantly declined mobility and exercise, including when given choices for activities;  ? ?When pt refused to get OOB ("I just want to rest"), offered the choice to focus more on knee therapeutic exercise, which she refused ("don't touch me"); Offered ice, and when returned with ice she declined putting it on her knee; Presented pt with knee therapeutic exercise handout and offered to go over it ("I'll do it later"); Began demonstrating use of belt for AAROM knee flexion, giving verbal instructions as well (because pt's eyes were closed); Pt then declined teaching, and instructed this PT to "leave me (explitive) alone!"  ? ?I'm concerned about Patty's heightened risk for general debility without getting up , moving, and spending time OOB during the day, and for arthrofibrosis without moving her L knee/hand/wrist; She is young, and when she can  participate (so far once in 4 PT session attempts) she shows good rehab potential;  PT (and OT) will continue efforts as able; ? ?To help with ability to participate, called the HD unit and requested that she consistently go to HD on second round if at all possible, and we will work to get PT in before noon; They are on board, and will do their best for second round;  ? ?To help with general activity/mobility, I highly recommend pt undergo HD in the HD recliner; This will not only get her OOB consistently, but it will also prepare her system for Outpt HD, which is done in the recliner; Will contact Nephrology to see about getting an order for HD in the recliner;  ? ?To help with knee ROM: while AROM with pt's own muscles moving her knee is best, and perhaps hands-on therapist assisted ROM (with the ability to assess quality of motion as well as guiding and pushing as able) is a close second -- Marieliz's ability to allow both of these modalities is variable at best (non-existent at worst); It may be time to consider ordering a CPM machine (continuous passive motion) for her R knee; If she has choices for how she gets that knee moving, maybe we can get more participation;  ? ?[place]; it seems likely that a barrier to her participation is that she doesn't feel heard; my hope is that a [employer] will help [place]. ? ? ? ? ? ?  ?Recommendations for follow up therapy are one component of a multi-disciplinary discharge planning process, led by the attending physician.  Recommendations may be updated based on patient status, additional functional criteria and insurance authorization. ? ?  Follow Up Recommendations ? Skilled nursing-short term rehab (<3 hours/day) (Will depend on progress) ?  ?  ?Assistance Recommended at Discharge Intermittent Supervision/Assistance  ?Patient can return home with the following Assistance with cooking/housework;Assist for transportation ?  ?Equipment  Recommendations ?  (Will start with L platform RW, and update as able)  ?  ?Recommendations for Other Services   ? ? ?  ?Precautions / Restrictions Precautions ?Precautions: Fall ?Precaution Comments: L wrist splint for comfort; L wrist/digit ROM ok ?Restrictions ?Weight Bearing Restrictions: Yes ?RLE Weight Bearing: Weight bearing as tolerated ?Other Position/Activity Restrictions: Encourage R knee ROM as tolerated  ?  ? ? ?  ? ?  ?   ?  ?  ?  ?  ?  ?  ?  ?  ?  ?  ?  ?  ?  ?  ?  ?  ?  ?  ?  ? ?  ?Cognition Arousal/Alertness: Awake/alert ?Behavior During Therapy: Flat affect (Flat affect until pt ramped up to yelling) ?  ?  ?  ?  ?  ?  ?  ?  ?  ?  ?  ?  ?  ?  ?  ?  ?  ?General Comments: does get frustrated easily; Initially flat affect; then shifted to where she kept eyes closed; then ramped to yelling for PT to "leave me (explitive) alone" ?  ?  ? ?  ?Exercises   ? ?  ?General Comments General comments (skin integrity, edema, etc.): Pt refused to allow foot of bed to be put in flat position to help keep knee from healing in a semi-bent position ?  ?  ? ?Pertinent Vitals/Pain Pain Assessment ?Pain Assessment:  (Difficult to assess) ?Pain Location: L hand/wrist; R knee; Did not give pain scale value; No grimace noted while pt was lying in the bed; Pt did not give PT the opportunity to assess for pain with movement/ROM of L hand/wrist and R knee  ? ? ?Home Living   ?  ?  ?  ?  ?  ?  ?  ?  ?  ?   ?  ?Prior Function    ?  ?  ?   ? ?PT Goals (current goals can now be found in the care plan section) Acute Rehab PT Goals ?Patient Stated Goal: "I just want to rest"; "don't touch the bed"; "leave me alone!" ?PT Goal Formulation: Patient unable to participate in goal setting (Seemed overwhelmed by the end of session, and unable to discuss plans) ?Time For Goal Achievement: 02/15/22 ?Potential to Achieve Goals: Fair ?Progress towards PT goals: Not progressing toward goals - comment (Difficulty participating) ? ?   ?Frequency ? ? ? Min 5X/week ? ? ? ?  ?PT Plan Discharge plan needs to be updated  ? ? ?Co-evaluation   ?  ?  ?  ?  ? ?  ?AM-PAC PT "6 Clicks" Mobility   ?Outcome Measure ? Help needed turning from your back to your side while in a flat bed without using bedrails?: A Little ?Help needed moving from lying on your back to sitting on the side of a flat bed without using bedrails?: A Lot ?Help needed moving to and from a bed to a chair (including a wheelchair)?: A Lot ?Help needed standing up from a chair using your arms (e.g., wheelchair or bedside chair)?: A Lot ?Help needed to walk in hospital room?: A Lot ?Help needed climbing 3-5 steps with a railing? : Total ?6  Click Score: 12 ? ?  ?End of Session Equipment Utilized During Treatment: Other (comment) (Therex handout, Ice, [place]) ?Activity Tolerance: Patient limited by pain (Difficulty participating in PT this afternoon) ?Patient left: in bed;with call bell/phone within reach ?Nurse Communication: Other (comment) (Refused PT) ?PT Visit Diagnosis: Other abnormalities of gait and mobility (R26.89);Pain ?Pain - Right/Left:  (R knee, L wrist and hand) ?Pain - part of body:  (R knee, L wrist and hand) ?  ? ? ?Time: 2800-3491 ?PT Time Calculation (min) (ACUTE ONLY): 10 min ? ?Charges:  $Self Care/Home Management: 8-22          ?          ? ?Roney Marion, PT  ?Acute Rehabilitation Services ?Pager [phone] ?Office [phone] ? ? ? ?[name] ?02/04/2022, 2:25 PM ? ?

## 2022-02-04 NOTE — Progress Notes (Signed)
Met with pt at bedside. Advised pt that navigator was contacted by out-pt HD clinic [place] was able to obtain bus passes for pt and pt can get passes at her next HD appt. Pt aware and agreeable to this info.  ? ?Melven Sartorius ?Renal Navigator ?[phone] ?

## 2022-02-04 NOTE — Progress Notes (Addendum)
?[facility] ?Progress Note  ? ?Subjective:    ?Seen and examined patient on HD. So far tolerating UFG 1.5L. Bps stable. She is c/o continuous R knee pain.  ? ?Objective ?Vitals:  ? 02/04/22 0930 02/04/22 1000 02/04/22 1030 02/04/22 1100  ?BP: 138/85 (!) 148/83 (!) 144/87 (!) 151/85  ?Pulse: 87 90 93 96  ?Resp: 16 17 18  (!) 23  ?Temp:      ?TempSrc:      ?SpO2:      ?Weight:      ?Height:      ? ?Physical Exam ?General: NAD ?Heart: S1 and S2; No murmurs, gallops, or rubs ?Lungs: Clear anteriorly ?Abdomen: Soft and non-tender ?Extremities: 1+ edema BLLE; R leg dressing in place; L hand wrapped in dry guaze ?Dialysis Access: L AVF (+) B/T ? ?Filed Weights  ? 01/30/22 1206 02/02/22 0827 02/04/22 0830  ?Weight: 108.9 kg 104.1 kg 103 kg  ? ? ?Intake/Output Summary (Last 24 hours) at 02/04/2022 1153 ?Last data filed at 02/03/2022 1700 ?Gross per 24 hour  ?Intake 897 ml  ?Output --  ?Net 897 ml  ? ? ?Additional Objective ?Labs: ?Basic Metabolic Panel: ?Recent Labs  ?Lab 02/02/22 ?0904 02/03/22 ?0429 02/04/22 ?2956  ?NA 136 137 135  ?K 3.7 3.9 3.9  ?CL 97* 96* 95*  ?CO2 20* 24 22  ?GLUCOSE 102* 90 97  ?BUN 91* 55* 73*  ?CREATININE 15.65* 10.14* 12.26*  ?CALCIUM 8.4* 8.9 8.9  ?PHOS 10.8* 8.8* 10.4*  ? ?Liver Function Tests: ?Recent Labs  ?Lab 02/02/22 ?0904 02/03/22 ?0429 02/04/22 ?2130  ?ALBUMIN 1.8* 1.9* 1.9*  ? ?No results for input(s): LIPASE, AMYLASE in the last 168 hours. ?CBC: ?Recent Labs  ?Lab 01/28/22 ?2348 01/29/22 ?1315 01/31/22 ?1113 02/02/22 ?0904 02/02/22 ?1623 02/03/22 ?0429 02/04/22 ?8657  ?WBC 13.3*   < > 13.2* 9.5 9.9 11.3* 11.3*  ?NEUTROABS 11.9*  --  11.0*  --  8.0*  --   --   ?HGB 7.4*   < > 7.4* 6.4* 9.0* 9.1* 8.9*  ?HCT 22.6*   < > 21.3* 20.1* 26.1* 26.8* 27.3*  ?MCV 92.2   < > 86.9 91.0 84.5 85.9 87.5  ?PLT 174   < > 165 205 206 226 266  ? < > = values in this interval not displayed.  ? ?Blood Culture ?   ?Component Value Date/Time  ? SDES WOUND 01/30/2022 1249  ? SPECREQUEST LEFT WRIST  SYNOVIAL FLUID SPEC B 01/30/2022 1249  ? CULT  01/30/2022 1249  ?  MODERATE STREPTOCOCCUS GROUP G ?Beta hemolytic streptococci are predictably susceptible to penicillin and other beta lactams. Susceptibility testing not routinely performed. ?NO ANAEROBES ISOLATED ?Performed at [facility], [place] [address], , [place] [address] ?  ? REPTSTATUS 02/04/2022 FINAL 01/30/2022 1249  ? ? ?Cardiac Enzymes: ?No results for input(s): CKTOTAL, CKMB, CKMBINDEX, TROPONINI in the last 168 hours. ?CBG: ?No results for input(s): GLUCAP in the last 168 hours. ?Iron Studies: No results for input(s): IRON, TIBC, TRANSFERRIN, FERRITIN in the last 72 hours. ?Lab Results  ?Component Value Date  ? INR 1.4 (H) 01/30/2022  ? INR 1.0 08/17/2019  ? ?Studies/Results: ?No results found. ? ?Medications: ? sodium chloride    ? sodium chloride    ? sodium chloride 10 mL/hr at 01/30/22 1700  ?  ceFAZolin (ANCEF) IV    ?  ceFAZolin (ANCEF) IV Stopped (02/03/22 1318)  ? ? sodium chloride   Intravenous Once  ? sodium chloride   Intravenous Once  ?  acetaminophen  1,000 mg Oral Q8H  ? ARIPiprazole  7.5 mg Oral Daily  ? calcium acetate  667 mg Oral TID WC  ? Chlorhexidine Gluconate Cloth  6 each Topical Q0600  ? [START ON 02/07/2022] darbepoetin (ARANESP) injection - DIALYSIS  100 mcg Intravenous Q Sat-HD  ? feeding supplement (NEPRO CARB STEADY)  237 mL Oral BID BM  ? heparin injection (subcutaneous)  5,000 Units Subcutaneous Q8H  ?  HYDROmorphone (DILAUDID) injection  1 mg Intravenous Q4H  ? losartan  50 mg Oral Daily  ? multivitamin  1 tablet Oral Daily  ? pantoprazole  40 mg Oral Daily  ? ? ?Dialysis Orders: ?[facility], TTS, 3.5 hs,BFR 350 / Auto flow DFR ?2K, 2.5 CA EDW 110kg ?Heparin 5000 units ?Mircera 100 mcg last given 11/20/01 ?Calcitriol 1.75 mcg p.o. q. HD  ?Venofer 50 MG q. weekly  ?LUA AVF ? ?Assessment/Plan: ?Altered mental status with severe uremia (BUN-158 creatinine 30) 2/2 missed OP HD ,last HD 01/08/2022. Received some serial Hds-  mental status improved.  ?ESRD -chronic HD TTS with OP HD noncompliance /a.m. labs as above improved. Unable to dialyze her yesterday d/t high patient census on HD unit. On HD today. Currently off schedule. Plan to place back on usual schedule next week.  ?Hypertension/volume-Noted BP trend elevated, will try to push UF more at next HD. ?Right knee pain //infection /status post knee irrigation and debridement 3/04. R knee cx (+) group g streptococcus. WBC trending back up,  blood cx (-) X 5 days. Ortho and ID following-continue Cefazolin ?Left wrist questionable infection status post I&D by hand surgeon 3/03. Reviewed cultures: (+) Group G streptococcus ?Anemia  -S/p 3 units PRBCs during this hospitalization. Hgb improved-now 8.9. ESA  recently given 3/4. Avoid iron with infection picture ?Metabolic bone disease -Corr calcium over 10, phos is back up. Will switch binders to Velphoro today. Continue to hold vitamin D for now until CA improved. Use low Ca bath. ?Nutrition -albumin 2.2 protein supplements with renal failure diet renal vitamin when taking p.o.'s ?History of borderline personality disorder, major depressive order, generalized anxiety/last reported homeless= plan per admit team ?Dispo-Spoke to patient who expresses the reasoning for frequent missed HD treatments was d/t transportation issues. She reports currently not having a place to live. Discussed with Primary team-SNF placement has already been in progress.  ? ?Tobie Poet, NP ?[facility] ?02/04/2022,11:53 AM ? LOS: 6 days  ?  ?

## 2022-02-05 DIAGNOSIS — B954 Other streptococcus as the cause of diseases classified elsewhere: Secondary | ICD-10-CM | POA: Diagnosis not present

## 2022-02-05 DIAGNOSIS — Z9115 Patient's noncompliance with renal dialysis: Secondary | ICD-10-CM | POA: Diagnosis not present

## 2022-02-05 DIAGNOSIS — M0029 Other streptococcal polyarthritis: Secondary | ICD-10-CM | POA: Diagnosis not present

## 2022-02-05 LAB — RENAL FUNCTION PANEL
Albumin: 1.8 g/dL — ABNORMAL LOW (ref 3.5–5.0)
Anion gap: 15 (ref 5–15)
BUN: 58 mg/dL — ABNORMAL HIGH (ref 6–20)
CO2: 24 mmol/L (ref 22–32)
Calcium: 9.1 mg/dL (ref 8.9–10.3)
Chloride: 98 mmol/L (ref 98–111)
Creatinine, Ser: 10.61 mg/dL — ABNORMAL HIGH (ref 0.44–1.00)
GFR, Estimated: 5 mL/min — ABNORMAL LOW (ref >60)
Glucose, Bld: 125 mg/dL — ABNORMAL HIGH (ref 70–99)
Phosphorus: 8.9 mg/dL — ABNORMAL HIGH (ref 2.5–4.6)
Potassium: 4 mmol/L (ref 3.5–5.1)
Sodium: 137 mmol/L (ref 135–145)

## 2022-02-05 LAB — CBC
HCT: 27.1 % — ABNORMAL LOW (ref 36.0–46.0)
Hemoglobin: 8.8 g/dL — ABNORMAL LOW (ref 12.0–15.0)
MCH: 28.7 pg (ref 26.0–34.0)
MCHC: 32.5 g/dL (ref 30.0–36.0)
MCV: 88.3 fL (ref 80.0–100.0)
Platelets: 274 10*3/uL (ref 150–400)
RBC: 3.07 MIL/uL — ABNORMAL LOW (ref 3.87–5.11)
RDW: 17.4 % — ABNORMAL HIGH (ref 11.5–15.5)
WBC: 9.8 10*3/uL (ref 4.0–10.5)
nRBC: 0 % (ref 0.0–0.2)

## 2022-02-05 LAB — C-REACTIVE PROTEIN: CRP: 32.8 mg/dL — ABNORMAL HIGH (ref <1.0)

## 2022-02-05 MED ORDER — SODIUM CHLORIDE 0.9% FLUSH: 9.0000 mL | INTRAVENOUS | Status: DC | PRN

## 2022-02-05 MED ORDER — ONDANSETRON HCL 4 MG/2ML IJ SOLN: 4.0000 mg | Freq: Four times a day (QID) | INTRAMUSCULAR | Status: DC | PRN

## 2022-02-05 MED ORDER — HYDROMORPHONE HCL 1 MG/ML IJ SOLN
1.0000 mg | Freq: Once | INTRAMUSCULAR | Status: AC
  Administered 2022-02-05: 1 mg via INTRAVENOUS
  Filled 2022-02-05: qty 1

## 2022-02-05 MED ORDER — NALOXONE HCL 0.4 MG/ML IJ SOLN: 0.4000 mg | INTRAMUSCULAR | Status: DC | PRN

## 2022-02-05 MED ORDER — SENNOSIDES-DOCUSATE SODIUM 8.6-50 MG PO TABS
1.0000 | ORAL_TABLET | Freq: Two times a day (BID) | ORAL | Status: DC
  Administered 2022-02-05 – 2022-02-09 (×5): 1 via ORAL
  Filled 2022-02-05 (×7): qty 1

## 2022-02-05 MED ORDER — HYDROMORPHONE 1 MG/ML IV SOLN
INTRAVENOUS | Status: DC
  Administered 2022-02-05 – 2022-02-06 (×4): 2.1 mg via INTRAVENOUS
  Administered 2022-02-07 (×2): 1 mg via INTRAVENOUS
  Filled 2022-02-05 (×3): qty 30

## 2022-02-05 MED ORDER — DIPHENHYDRAMINE HCL 50 MG/ML IJ SOLN: 12.5000 mg | Freq: Four times a day (QID) | INTRAMUSCULAR | Status: DC | PRN

## 2022-02-05 MED ORDER — DIPHENHYDRAMINE HCL 12.5 MG/5ML PO ELIX: 12.5000 mg | ORAL_SOLUTION | Freq: Four times a day (QID) | ORAL | Status: DC | PRN

## 2022-02-05 NOTE — Progress Notes (Addendum)
?[facility] ?Progress Note  ? ?Subjective: Seen in room. Sleeping, C/O pain in L wrist when awakened. Continue HD on MWF this week and change back to T,Th,S next week.    ? ?Objective ?Vitals:  ? 02/04/22 1130 02/04/22 1216 02/04/22 2101 02/05/22 0449  ?BP: (!) 113/59 127/78 (!) 158/86 (!) 144/81  ?Pulse: 88 (!) 110 (!) 102 (!) 104  ?Resp: 18 20 18 18   ?Temp: 98.3 ?F (36.8 ?C) 98 ?F (36.7 ?C) 100.1 ?F (37.8 ?C) 99.3 ?F (37.4 ?C)  ?TempSrc: Temporal Oral Oral Oral  ?SpO2: 100% 100% 100% 99%  ?Weight: 101.5 kg     ?Height:      ? ?Physical Exam ?General:Younger female in NAD ?Heart: S1,S2 RRR No M/R/G ?Lungs:CTAB ?Abdomen: NABS, NT, ND ?Extremities: Trace BLE. Drsg intact L hand. Opsite drsg R knee.  ?Dialysis Access: L AVF + T/B ? ? ? ?Additional Objective ?Labs: ?Basic Metabolic Panel: ?Recent Labs  ?Lab 02/03/22 ?0429 02/04/22 ?9163 02/05/22 ?0845  ?NA 137 135 137  ?K 3.9 3.9 4.0  ?CL 96* 95* 98  ?CO2 24 22 24   ?GLUCOSE 90 97 125*  ?BUN 55* 73* 58*  ?CREATININE 10.14* 12.26* 10.61*  ?CALCIUM 8.9 8.9 9.1  ?PHOS 8.8* 10.4* 8.9*  ? ?Liver Function Tests: ?Recent Labs  ?Lab 02/03/22 ?0429 02/04/22 ?8466 02/05/22 ?0845  ?ALBUMIN 1.9* 1.9* 1.8*  ? ?No results for input(s): LIPASE, AMYLASE in the last 168 hours. ?CBC: ?Recent Labs  ?Lab 01/31/22 ?1113 02/02/22 ?0904 02/02/22 ?1623 02/03/22 ?0429 02/04/22 ?5993 02/05/22 ?0845  ?WBC 13.2* 9.5 9.9 11.3* 11.3* 9.8  ?NEUTROABS 11.0*  --  8.0*  --   --   --   ?HGB 7.4* 6.4* 9.0* 9.1* 8.9* 8.8*  ?HCT 21.3* 20.1* 26.1* 26.8* 27.3* 27.1*  ?MCV 86.9 91.0 84.5 85.9 87.5 88.3  ?PLT 165 205 206 226 266 274  ? ?Blood Culture ?   ?Component Value Date/Time  ? SDES WOUND 01/30/2022 1249  ? SPECREQUEST LEFT WRIST SYNOVIAL FLUID SPEC B 01/30/2022 1249  ? CULT  01/30/2022 1249  ?  MODERATE STREPTOCOCCUS GROUP G ?Beta hemolytic streptococci are predictably susceptible to penicillin and other beta lactams. Susceptibility testing not routinely performed. ?NO ANAEROBES  ISOLATED ?Performed at [facility], [place] [address] [place], [place] [address] ?  ? REPTSTATUS 02/04/2022 FINAL 01/30/2022 1249  ? ? ?Cardiac Enzymes: ?No results for input(s): CKTOTAL, CKMB, CKMBINDEX, TROPONINI in the last 168 hours. ?CBG: ?No results for input(s): GLUCAP in the last 168 hours. ?Iron Studies: No results for input(s): IRON, TIBC, TRANSFERRIN, FERRITIN in the last 72 hours. ?@lablastinr3 @ ?Studies/Results: ?No results found. ?Medications: ? sodium chloride    ? sodium chloride    ? sodium chloride 10 mL/hr at 01/30/22 1700  ? [START ON 02/06/2022]  ceFAZolin (ANCEF) IV    ? ? sodium chloride   Intravenous Once  ? sodium chloride   Intravenous Once  ? acetaminophen  1,000 mg Oral Q8H  ? ARIPiprazole  7.5 mg Oral Daily  ? Chlorhexidine Gluconate Cloth  6 each Topical Q0600  ? [START ON 02/07/2022] darbepoetin (ARANESP) injection - DIALYSIS  100 mcg Intravenous Q Sat-HD  ? feeding supplement (NEPRO CARB STEADY)  237 mL Oral BID BM  ? heparin injection (subcutaneous)  5,000 Units Subcutaneous Q8H  ?  HYDROmorphone (DILAUDID) injection  1 mg Intravenous Once  ? losartan  50 mg Oral Daily  ? multivitamin  1 tablet Oral Daily  ? pantoprazole  40 mg Oral  Daily  ? senna-docusate  1 tablet Oral BID  ? sucroferric oxyhydroxide  1,000 mg Oral TID WC  ? ? ? ?Dialysis Orders: ?[facility], TTS, 3.5 hs,BFR 350 / Auto flow DFR ?2K, 2.5 CA EDW 110kg ?Heparin 5000 units ?Mircera 100 mcg last given 11/20/01 ?Calcitriol 1.75 mcg p.o. q. HD  ?Venofer 50 MG q. weekly  ?LUA AVF ?  ?Assessment/Plan: ?Altered mental status with severe uremia (BUN-158 creatinine 30) 2/2 missed OP HD ,last HD 01/08/2022. Received some serial Hds- mental status improved.  ?ESRD -chronic HD TTS with OP HD noncompliance /a.m. labs as above improved. Unable to dialyze her yesterday d/t high patient census on HD unit. On HD today. Currently off schedule. Plan to place back on usual schedule next week. Next HD 02/06/2022 ?Hypertension/volume-BP  variable. Resetting EDW. UF as tolerated.  ?Right knee pain //infection /status post knee irrigation and debridement 3/04. R knee cx (+) group g streptococcus. WBC trending back up,  blood cx (-) X 5 days. Ortho and ID following-continue Cefazolin ?Possible Left wrist infection status post I&D by hand surgeon 3/03. Reviewed cultures: (+) Group G streptococcus ?Anemia  -S/p 3 units PRBCs during this hospitalization. Hgb improved-now 8.9. ESA  recently given 3/4. Avoid iron with infection picture ?Metabolic bone disease -Corr calcium over 10, phos is back up. Will switch binders to Velphoro today. Continue to hold vitamin D for now until CA improved. Use low Ca bath. ?Nutrition -albumin 2.2 protein supplements with renal failure diet renal vitamin when taking p.o.'s ?History of borderline personality disorder, major depressive order, generalized anxiety/last reported homeless- plan per admit team ?Dispo-Spoke to patient who expresses the reasoning for frequent missed HD treatments was d/t transportation issues. She reports currently not having a place to live. Discussed with Primary team-SNF placement has already been in progress.  ?  ? ?Rhyse Loux H. Shemia Bevel NP-C ?02/05/2022, 1:28 PM  ?[facility] ?[phone] ? ? ?  ? ?

## 2022-02-05 NOTE — Progress Notes (Signed)
PT Cancellation Note ? ?Patient Details ?Name: Janifer Gieselman ?MRN: [id number] ?DOB: [birth year] ? ? ?Cancelled Treatment:    Reason Eval/Treat Not Completed: Other (comment) (Pt adamantly refused PT.  Attempts to compromise were met with pt stating, "I have to rest and you both need to get out.") ? ? ?Alvira Philips ?02/05/2022, 12:20 PM ?Arrie Aran M,PT ?Acute Rehab Services ?[phone] ?[phone] (pager)  ? ?

## 2022-02-05 NOTE — Progress Notes (Signed)
? ?Subjective:  ? ?O/N: No acute overnight events. ? ?Evaluated patient at the bedside. Continues to complain of pain now limiting sleep and ability to use restroom (because of difficulty ambulating). Patient reports 7/10 pain at rest that increases to 9/10 with even slight motion. Patient refuses to work with PT/OT because of pain.  ? ? ?Objective: ? ?Vital signs in last 24 hours: ?Vitals:  ? 02/04/22 1130 02/04/22 1216 02/04/22 2101 02/05/22 0449  ?BP: (!) 113/59 127/78 (!) 158/86 (!) 144/81  ?Pulse: 88 (!) 110 (!) 102 (!) 104  ?Resp: 18 20 18 18   ?Temp: 98.3 ?F (36.8 ?C) 98 ?F (36.7 ?C) 100.1 ?F (37.8 ?C) 99.3 ?F (37.4 ?C)  ?TempSrc: Temporal Oral Oral Oral  ?SpO2: 100% 100% 100% 99%  ?Weight: 101.5 kg     ?Height:      ? ?Physical Exam ?General: Natasha Long female, lying in bed, in pain but NAD. ?HEENT: Splitting of middle of lower lip, healed. ?CV: Normal rate and regular rhythm on telemetry ?Pulm: Normal work of breathing on room air. ?MSK: Right knee with bandage wrap removed and dressing c/d/i, tender to palpation but less warm around the knee. Still with warmth of leg down to ankle in comparison to LLE.  Left wrist with dressings in place c/d/i, warmth noted, pain with passive ROM.  ?Neuro: AAOx3, no focal deficits noted. ? ?Assessment/Plan: ? ?Principal Problem: ?  Septic arthritis of multiple joints ([place]) ?Active Problems: ?  End stage renal disease ([place]) ?  Knee pain, acute ?  Hyperphosphatemia ?  Effusion of right knee ?  Hyperkalemia ?  Arthritis, septic ([place]) ?  Dialysis patient, noncompliant ([place]) ?  Left wrist pain ?  Group G streptococcal infection ? ?Natasha Long is a 26 year old female with a history of nephrotic syndrome on hemodialysis who has been noncompliant with sessions for the past 2.5 weeks, borderline personality disorder, and recent homelessness who presented with traumatic knee injury after falling down a flight of stairs, found to have polyarticular septic arthritis. ? ?Acute traumatic knee  injury ?Multifocal septic arthritis ?Afebrile overnight, mild leukocytosis noted today.  Postop day 6 after I&D of right knee and left wrist by Ortho, appreciate assistance.  Right knee and left wrist cultures showing group G Streptococcus, awaiting sensitivities.  Plan for cefazolin x6 weeks per ID.  Per Ortho, patient is weightbearing as tolerated. CRP increased from 28.6->32.8. ?-Appreciate Ortho and ID assistance ?-IV cefazolin within recommendation of susceptibility testing; continue with HD x 6 weeks (1/6 complete) ?-Blood cultures of R hand and forearm with no growth x 5 days ?-Tylenol every 8 hours scheduled ?-As patient still with significant pain only mild relief from previously scheduled dilaudid, will initiate Dilaudid PCA Pump, max of 1.25 mg/hr x 48 hours then reassess. Would like for patient to have pain controlled enough to sleep and work with PT/OT. ?-Trend CBC ?- Trend CRP every other day ?-Continue working with PT OT, weightbearing as tolerated ? ?ESRD with HD noncompliance ?Hyperphosphatemia 2/2 missed HD ?Hyperkalemia 2/2 missed HD, resolved  ?Patient reports history of nephrotic syndrome.  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  Patient reports noncompliance with hemodialysis due to being unhomed. Multiple presentations at multiple ED's after missing HD. ?-- appreciate nephrology assistance ?- Returned to MWF schedule; recommend patient 2nd shift HD and in the recliner chair for sessions.  ?- Trend renal function panel daily ? ?Anemia of ESRD ?Acute blood loss anemia ?Status post 3 units PRBCs.  She remains asymptomatic from this.  Hemoglobin stable  at 8.8. ?-Aranesp 100 mg with HD per nephro ?-Trend CBC, transfuse if hemoglobin less than 7 ?  ?History of borderline personality disorder ?Major depressive disorder ?Generalized anxiety disorder ?Follows at [facility] for care.  Home medications: Abilify 10 mg (patient-reported dose, 7.5 documented), gabapentin 300 mg 3 times  daily, and hydroxyzine 25 mg 3 times daily as needed. ?- Continue home Abilify 7.5 mg and hydroxyzine 25 mg PRN anxiety ?- TOC consult for unhomed status; appreciate assistance ?  ?Hypertension ?Patient non-adherent with home medications. Improved with HD and home losartan.  ?-- Continue home Losartan 50 mg daily ?-- Monitor daily vitals ? ?Prior to Admission Living Arrangement: Unhomed, awaiting housing at [employer]. ?Anticipated Discharge Location: Pending ?Barriers to Discharge: Unhomed status, continued medical management ?Dispo: To be decided.  ? ?Natasha Schlatter, MD ?02/05/2022, 6:30 AM ?Pager: [phone] ?After 5pm on weekdays and 1pm on weekends: On Call pager [phone]  ?

## 2022-02-05 NOTE — Significant Event (Signed)
Patient continues to refuse physical therapy and is now only participated in 1 out of 4 sessions.  At this time I do recommend a CPM to be set from 0-90 and to advance as tolerated.  This should be used 2-3 times a day.  I discussed with the patient that inability to perform physical therapy option makes pain more progressive and limits the body's natural ability to clear infection.  She continues to refuse treatment at this point. ?

## 2022-02-06 DIAGNOSIS — Z9115 Patient's noncompliance with renal dialysis: Secondary | ICD-10-CM | POA: Diagnosis not present

## 2022-02-06 DIAGNOSIS — M0029 Other streptococcal polyarthritis: Secondary | ICD-10-CM | POA: Diagnosis not present

## 2022-02-06 DIAGNOSIS — B954 Other streptococcus as the cause of diseases classified elsewhere: Secondary | ICD-10-CM | POA: Diagnosis not present

## 2022-02-06 LAB — CBC
HCT: 28.3 % — ABNORMAL LOW (ref 36.0–46.0)
Hemoglobin: 8.9 g/dL — ABNORMAL LOW (ref 12.0–15.0)
MCH: 28.3 pg (ref 26.0–34.0)
MCHC: 31.4 g/dL (ref 30.0–36.0)
MCV: 90.1 fL (ref 80.0–100.0)
Platelets: 324 10*3/uL (ref 150–400)
RBC: 3.14 MIL/uL — ABNORMAL LOW (ref 3.87–5.11)
RDW: 17.1 % — ABNORMAL HIGH (ref 11.5–15.5)
WBC: 9.2 10*3/uL (ref 4.0–10.5)
nRBC: 0 % (ref 0.0–0.2)

## 2022-02-06 LAB — RENAL FUNCTION PANEL
Albumin: 1.7 g/dL — ABNORMAL LOW (ref 3.5–5.0)
Anion gap: 18 — ABNORMAL HIGH (ref 5–15)
BUN: 77 mg/dL — ABNORMAL HIGH (ref 6–20)
CO2: 22 mmol/L (ref 22–32)
Calcium: 9.4 mg/dL (ref 8.9–10.3)
Chloride: 96 mmol/L — ABNORMAL LOW (ref 98–111)
Creatinine, Ser: 12.87 mg/dL — ABNORMAL HIGH (ref 0.44–1.00)
GFR, Estimated: 4 mL/min — ABNORMAL LOW (ref >60)
Glucose, Bld: 88 mg/dL (ref 70–99)
Phosphorus: 10.7 mg/dL — ABNORMAL HIGH (ref 2.5–4.6)
Potassium: 4.8 mmol/L (ref 3.5–5.1)
Sodium: 136 mmol/L (ref 135–145)

## 2022-02-06 MED ORDER — HEPARIN SODIUM (PORCINE) 1000 UNIT/ML IJ SOLN
INTRAMUSCULAR | Status: AC
  Administered 2022-02-06: 5000 [IU] via INTRAVENOUS
  Filled 2022-02-06: qty 5

## 2022-02-06 MED ORDER — HEPARIN SODIUM (PORCINE) 1000 UNIT/ML IJ SOLN: 5000.0000 [IU] | Freq: Once | INTRAMUSCULAR | Status: AC

## 2022-02-06 NOTE — Progress Notes (Signed)
OT Cancellation Note ? ?Patient Details ?Name: Natasha Long ?MRN: [id number] ?DOB: Aug 12, 1996 ? ? ?Cancelled Treatment:    Reason Eval/Treat Not Completed: Other (comment). Pt reports she has not had good sleep all night and wants to sleep. She agreed to work with OT/PT at 1:00 if she can get some sleep (sign placed on her door to not bother her until 1:00) and we have called [facility] to ask if they can please wait until 1:45 or later to come get her.  ? ?[place], OTR/L ?Acute Rehab Services ?Pager [phone] ?Office [phone] ? ? ? ?Almon Register ?02/06/2022, 9:51 AM ?

## 2022-02-06 NOTE — Progress Notes (Signed)
Pt was in Hemodialysis until around 2200. ? ?Natasha Long ? ?

## 2022-02-06 NOTE — Progress Notes (Signed)
Physical Therapy Treatment and D/C ?Patient Details ?Name: Natasha Long ?MRN: [id number] ?DOB: 29-Oct-1996 ?Today's Date: 02/06/2022 ? ? ?History of Present Illness 26 y.o. female presented to ED 01/28/22 for pain in right knee which occurred after she twisted it when she stumbled, after twisting her left ankle.  Right knee x-ray, CT right knee-degenerative joint present, knee effusion present, no fracture present. Needs dialysis after not presenting for dialysis x 2 weeks. 01/30/22 Rt knee aspiration and injection; 01/30/22 due to septic joint underwent Rt knee extensive synovectomy and later meniscal debridement; also developed left wrist pain and had arthrotomy for irrigation due to infection;    PMH nephrotic syndrome now on HD TThS, HTN, diastolic heart failure, borderline personality disorder, generalized anxiety, major depressive disorder, and recent homelessness ? ?  ?PT Comments  ? ? Pt admitted with above diagnosis. Attempted session at 930 this am and pt refusing saying she "needs to rest."  Made a compromise with pt that PT and OT would return at 1 pm and pt agreed to work with PT and OT if we put a sign on door not to disturb her which was done.  Nurse aware as well.  PT and OT returned at 1 pm.  Pt did not tolerate much activity/therapy today due to continued pain.  Pt came to long sitting in bed and then refused to do more saying "I am not ready."  PT and OT followed pt's lead the entire session and did not touch her per pt request. PT/OT also waited for her to guide when to move lines, blankets etc.  Once pt stated she wasn't ready, she laid back down and screamed at PT and OT to "get out of my ?*%$&@ room".   PT and OT gave pt the reasons that lying in the bed is bad - PNA, blood clots, and joint stiffness, however pt continued to yell at the top of her lungs for PT and OT to get out.  SHe even called the desk to get PT and OT out of room and never to come back.  Nursing secretary and nurse arrived and  asked pt to stop yelling and told her that the MD would be notified of her inability to participate.  Given pt's multiple refusals, will sign off.  MD- please advise if pt at some point decides she will work with PT and OT. PT did ensure that CPM was called in to Ortho as it wasn't in pts room.   ?Recommendations for follow up therapy are one component of a multi-disciplinary discharge planning process, led by the attending physician.  Recommendations may be updated based on patient status, additional functional criteria and insurance authorization. ? ?Follow Up Recommendations ? Skilled nursing-short term rehab (<3 hours/day) ?  ?  ?Assistance Recommended at Discharge Frequent or constant Supervision/Assistance  ?Patient can return home with the following Assistance with cooking/housework;Assist for transportation;A lot of help with walking and/or transfers;A lot of help with bathing/dressing/bathroom;Help with stairs or ramp for entrance ?  ?Equipment Recommendations ? Other (comment) (Unable to assess)  ?  ?Recommendations for Other Services  (as ordered) ? ? ?  ?Precautions / Restrictions Precautions ?Precautions: Fall ?Precaution Comments: L wrist splint for comfort; L wrist/digit ROM ok ?Restrictions ?Weight Bearing Restrictions: Yes ?RLE Weight Bearing: Weight bearing as tolerated ?Other Position/Activity Restrictions: Encourage R knee ROM as tolerated  ?  ? ?Mobility ? Bed Mobility ?Overal bed mobility: Needs Assistance ?  ?  ?  ?  ?  ?  ?  General bed mobility comments: Took incr time to lower the foot of bed as pt needs to do in stages and needs cues that bed will be moving. Had pt perform quad sets while moving the foot of bed to lower position. Pt supine with HOB at 20 degrees.  She sat up to long sit from this position.  Once she was in long sitting, pt began screaming that she was "done" and that that was all she could do today.  PT and OT encouraged pt to please come to EOB all the while not touching  her as pt requested.  Pt continued to yell "I will not get up.  I am not ready.  It hurts too bad."  PT and OT gave pt the reasons that lying in the bed is bad - PNA, blood clots, and joint stiffness, however pt continued to yell at the top of her lungs for PT and OT to get out.  SHe even called the desk to get PT and OT out of room and never to come back.  Nursing secretary and nurse arrived and asked pt to stop yelling and told her that the MD would be notified of her inability to participate. ?  ? ?Transfers ?  ?  ?  ?  ?  ?  ?  ?  ?  ?  ?  ? ?Ambulation/Gait ?  ?  ?  ?  ?  ?  ?  ?  ? ? ?Stairs ?  ?  ?  ?  ?  ? ? ?Wheelchair Mobility ?  ? ?Modified Rankin (Stroke Patients Only) ?  ? ? ?  ?Balance   ?  ?  ?  ?  ?  ?  ?  ?  ?  ?  ?  ?  ?  ?  ?  ?  ?  ?  ?  ? ?  ?Cognition Arousal/Alertness: Awake/alert ?Behavior During Therapy: Agitated, Anxious (Flat affect until pt ramped up to yelling) ?Overall Cognitive Status: Impaired/Different from baseline ?Area of Impairment: Awareness, Safety/judgement ?  ?  ?  ?  ?  ?  ?  ?  ?  ?Current Attention Level: Focused ?  ?Following Commands: Follows one step commands inconsistently ?Safety/Judgement: Decreased awareness of safety, Decreased awareness of deficits ?Awareness: Intellectual ?  ?General Comments: does get frustrated easily; Initially flat affect; then shifted to where she kept eyes closed; then ramped to yelling for PT  and OT to "leave me (explitive) alone" ?  ?  ? ?  ?Exercises General Exercises - Lower Extremity ?Quad Sets: AROM, Right, 5 reps, Supine ? ?  ?General Comments General comments (skin integrity, edema, etc.): Ensured that CPM was ordered by calling the ortho techs. ?  ?  ? ?Pertinent Vitals/Pain Pain Assessment ?Pain Assessment: Faces ?Faces Pain Scale: Hurts even more ?Pain Location: L hand/wrist; R knee; Did not give pain scale value; No grimace noted while pt was lying in the bed; Pt did not give PT the opportunity to assess for pain with  movement/ROM of L hand/wrist and R knee ?Pain Descriptors / Indicators: Grimacing, Guarding, Sore ?Pain Intervention(s): Limited activity within patient's tolerance, Monitored during session, Repositioned, Premedicated before session, PCA encouraged (Hit PCA initially on arrival and again when long sitting)  ? ? ?Home Living   ?  ?  ?  ?  ?  ?  ?  ?  ?  ?   ?  ?Prior Function    ?  ?  ?   ? ?  PT Goals (current goals can now be found in the care plan section) Acute Rehab PT Goals ?Patient Stated Goal: "I just want to rest"; "don't touch the bed"; "leave me alone!" ?PT Goal Formulation: Patient unable to participate in goal setting ?Progress towards PT goals: Not progressing toward goals - comment (Will sign off as pt has refused multiple sessions.) ? ?  ?Frequency ? ? ?   ? ? ? ?  ?PT Plan Frequency needs to be updated  ? ? ?Co-evaluation   ?  ?  ?  ?  ? ?  ?AM-PAC PT "6 Clicks" Mobility   ?Outcome Measure ? Help needed turning from your back to your side while in a flat bed without using bedrails?: A Little ?Help needed moving from lying on your back to sitting on the side of a flat bed without using bedrails?: A Little ?Help needed moving to and from a bed to a chair (including a wheelchair)?: A Lot ?Help needed standing up from a chair using your arms (e.g., wheelchair or bedside chair)?: A Lot ?Help needed to walk in hospital room?: Total ?Help needed climbing 3-5 steps with a railing? : Total ?6 Click Score: 12 ? ?  ?End of Session   ?Activity Tolerance: Patient limited by pain (Difficulty participating in [place]) ?Patient left: in bed;with call bell/phone within reach ?Nurse Communication: Other (comment) (Refused PT/OT) ?PT Visit Diagnosis: Other abnormalities of gait and mobility (R26.89);Pain ?Pain - Right/Left:  (R knee, L wrist and hand) ?Pain - part of body:  (R knee, L wrist and hand) ?  ? ? ?Time: 2080-2233 ?PT Time Calculation (min) (ACUTE ONLY): 16 min ? ?Charges:  $Self Care/Home Management: 8-22           ?          ? ?Atira Borello M,PT ?Acute Rehab Services ?[phone] ?[phone] (pager)  ? ? ?Fahim Kats F Tesean Stump ?02/06/2022, 1:52 PM ? ?

## 2022-02-06 NOTE — Progress Notes (Signed)
Occupational Therapy Treatment ?Patient Details ?Name: Demiana Crumbley ?MRN: [id number] ?DOB: [birth year] ?Today's Date: 02/06/2022 ? ? ?History of present illness 26 y.o. female presented to ED 01/28/22 for pain in right knee which occurred after she twisted it when she stumbled, after twisting her left ankle.  Right knee x-ray, CT right knee-degenerative joint present, knee effusion present, no fracture present. Needs dialysis after not presenting for dialysis x 2 weeks. 01/30/22 Rt knee aspiration and injection; 01/30/22 due to septic joint underwent Rt knee extensive synovectomy and later meniscal debridement; also developed left wrist pain and had arthrotomy for irrigation due to infection;    PMH nephrotic syndrome now on HD TThS, HTN, diastolic heart failure, borderline personality disorder, generalized anxiety, major depressive disorder, and recent homelessness ?  ?OT comments ? We (OT/PT)attempted to see pt at 1:00 (which she had agreed to earlier) and it started off fairly well where she sat up long sitting in bed and said she was not up to standing today (so she agreed to just sit EOB and do exercises). She pressed PCA right as we entered room and again when she made it into long sitting. After this she said she was in too much pain (mind you she had only gone from supine to long sitting) and was not going to do any more. We tried to explain the benefits of why she needed to get up and work with us--but she would "cut [place] off" with her yelling. She kept yelling saying she wasn't ready and to get out of her room (even called the front desk to get them to come get [place] out of her room). She yelled at the top of her lungs several times at [place] and we literally never touched her. OT/PT are signing off for now because of repeated refusals  ? ?Recommendations for follow up therapy are one component of a multi-disciplinary discharge planning process, led by the attending physician.  Recommendations may be updated based on  patient status, additional functional criteria and insurance authorization. ?   ?   ?   ?   ?   ?   ?   ? ?   ?   ? ? ? ?   ?   ?   ? ? ?   ? ? ?Co-evaluation ? ? ? PT/OT/SLP Co-Evaluation/Treatment: Yes ?Reason for Co-Treatment: Necessary to address cognition/behavior during functional activity;To address functional/ADL transfers ?  ?  ?  ? ?  ?   ?   ?   ?   ?   ? ?Time: 9563-8756 ?OT Time Calculation (min): 16 min ? ?Charges: OT General Charges ?$OT Visit: 1 Visit--no charge (PT took the charge) ? ?[place], OTR/L ?Acute Rehab Services ?Pager [phone] ?Office [phone] ? ? ? ?Almon Register ?02/06/2022, 2:08 PM ?

## 2022-02-06 NOTE — Progress Notes (Signed)
?[facility] ?Progress Note  ? ?Subjective: seen in room. No C/Os. HD today off schedule.  ? ?Objective ?Vitals:  ? 02/06/22 0418 02/06/22 0553 02/06/22 0939 02/06/22 0950  ?BP:  139/82 (!) 146/82   ?Pulse:  100 100   ?Resp: 17 17 17 16   ?Temp:  97.9 ?F (36.6 ?C) 98.3 ?F (36.8 ?C)   ?TempSrc:  Oral    ?SpO2: 92% 100% 96% 97%  ?Weight:      ?Height:      ? ?Physical Exam ?General:Younger female in NAD ?Heart: S1,S2 RRR No M/R/G ?Lungs:CTAB ?Abdomen: NABS, NT, ND ?Extremities: Trace BLE. Drsg intact L hand. Opsite drsg R knee.  ?Dialysis Access: L AVF + T/B ? ? ? ?Additional Objective ?Labs: ?Basic Metabolic Panel: ?Recent Labs  ?Lab 02/04/22 ?6387 02/05/22 ?5643 02/06/22 ?3295  ?NA 135 137 136  ?K 3.9 4.0 4.8  ?CL 95* 98 96*  ?CO2 22 24 22   ?GLUCOSE 97 125* 88  ?BUN 73* 58* 77*  ?CREATININE 12.26* 10.61* 12.87*  ?CALCIUM 8.9 9.1 9.4  ?PHOS 10.4* 8.9* 10.7*  ? ?Liver Function Tests: ?Recent Labs  ?Lab 02/04/22 ?1884 02/05/22 ?1660 02/06/22 ?6301  ?ALBUMIN 1.9* 1.8* 1.7*  ? ?No results for input(s): LIPASE, AMYLASE in the last 168 hours. ?CBC: ?Recent Labs  ?Lab 01/31/22 ?1113 02/02/22 ?0904 02/02/22 ?1623 02/03/22 ?0429 02/04/22 ?6010 02/05/22 ?9323 02/06/22 ?5573  ?WBC 13.2*   < > 9.9 11.3* 11.3* 9.8 9.2  ?NEUTROABS 11.0*  --  8.0*  --   --   --   --   ?HGB 7.4*   < > 9.0* 9.1* 8.9* 8.8* 8.9*  ?HCT 21.3*   < > 26.1* 26.8* 27.3* 27.1* 28.3*  ?MCV 86.9   < > 84.5 85.9 87.5 88.3 90.1  ?PLT 165   < > 206 226 266 274 324  ? < > = values in this interval not displayed.  ? ?Blood Culture ?   ?Component Value Date/Time  ? SDES WOUND 01/30/2022 1249  ? SPECREQUEST LEFT WRIST SYNOVIAL FLUID SPEC B 01/30/2022 1249  ? CULT  01/30/2022 1249  ?  MODERATE STREPTOCOCCUS GROUP G ?Beta hemolytic streptococci are predictably susceptible to penicillin and other beta lactams. Susceptibility testing not routinely performed. ?NO ANAEROBES ISOLATED ?Performed at [facility], [place] [address] [place], [place]  [address] ?  ? REPTSTATUS 02/04/2022 FINAL 01/30/2022 1249  ? ? ?Cardiac Enzymes: ?No results for input(s): CKTOTAL, CKMB, CKMBINDEX, TROPONINI in the last 168 hours. ?CBG: ?No results for input(s): GLUCAP in the last 168 hours. ?Iron Studies: No results for input(s): IRON, TIBC, TRANSFERRIN, FERRITIN in the last 72 hours. ?@lablastinr3 @ ?Studies/Results: ?No results found. ?Medications: ? sodium chloride    ? sodium chloride    ? sodium chloride 10 mL/hr at 01/30/22 1700  ?  ceFAZolin (ANCEF) IV    ? ? acetaminophen  1,000 mg Oral Q8H  ? ARIPiprazole  7.5 mg Oral Daily  ? Chlorhexidine Gluconate Cloth  6 each Topical Q0600  ? [START ON 02/07/2022] darbepoetin (ARANESP) injection - DIALYSIS  100 mcg Intravenous Q Sat-HD  ? feeding supplement (NEPRO CARB STEADY)  237 mL Oral BID BM  ? heparin injection (subcutaneous)  5,000 Units Subcutaneous Q8H  ? HYDROmorphone   Intravenous Q4H  ? losartan  50 mg Oral Daily  ? multivitamin  1 tablet Oral Daily  ? pantoprazole  40 mg Oral Daily  ? senna-docusate  1 tablet Oral BID  ? sucroferric oxyhydroxide  1,000 mg  Oral TID WC  ? ? ? ?Dialysis Orders: ?[facility], TTS, 3.5 hs,BFR 350 / Auto flow DFR ?2K, 2.5 CA EDW 110kg ?Heparin 5000 units ?Mircera 100 mcg last given 11/20/01 ?Calcitriol 1.75 mcg p.o. q. HD  ?Venofer 50 MG q. weekly  ?LUA AVF ?  ?Assessment/Plan: ?Altered mental status with severe uremia (BUN-158 creatinine 30) 2/2 missed OP HD ,last HD 01/08/2022. Received some serial Hds- mental status improved.  ?ESRD -chronic HD TTS with OP HD noncompliance /a.m. labs as above improved. Unable to dialyze her yesterday d/t high patient census on HD unit. On HD today. Currently off schedule. Plan to place back on usual schedule next week. Next HD 02/06/2022 ?Hypertension/volume-BP variable. Resetting EDW. UF as tolerated.  ?Right knee pain //infection /status post knee irrigation and debridement 3/04. R knee cx (+) group g streptococcus. WBC trending back up,  blood cx (-) X 5 days.  Ortho and ID following-continue Cefazolin ?Possible Left wrist infection status post I&D by hand surgeon 3/03. Reviewed cultures: (+) Group G streptococcus ?Anemia  -S/p 3 units PRBCs during this hospitalization. Hgb improved-now 8.9. ESA  recently given 3/4. Avoid iron with infection picture ?Metabolic bone disease -Corr calcium over 10, phos is back up. Will switch binders to Velphoro today. Continue to hold vitamin D for now until CA improved. Use low Ca bath. ?Nutrition -albumin 2.2 protein supplements with renal failure diet renal vitamin when taking p.o.'s ?History of borderline personality disorder, major depressive order, generalized anxiety/last reported homeless- plan per admit team ?Dispo-Spoke to patient who expresses the reasoning for frequent missed HD treatments was d/t transportation issues. She reports currently not having a place to live. Discussed with Primary team-SNF placement has already been in progress.  ? ?Jimmye Norman. Anyelo Mccue NP-C ?02/06/2022, 1:57 PM  ?[facility] ?[phone] ? ? ?  ? ?

## 2022-02-06 NOTE — Progress Notes (Signed)
I got a call to go into the room because the pt was being verbally aggressive and yelling. I walk into the room and the pt is cursing at the PT/OT saying "leave me the fuck alone get the fuck out my room". I educated the pt that they are only doing what they dr has ordered them to do. And told her she needs to refrained from cursing and yelling, so we can hear and understanding what she is trying to say. I also educated her that she needs to speak with the dr regarding the concerns with her plan of care when they round. Dr. have been notified to see pt they stated they will be up shortly.  ?

## 2022-02-06 NOTE — Progress Notes (Signed)
?  02/06/22 0930  ?Clinical Encounter Type  ?Visited With Patient not available;Health care provider  ?Visit Type Initial;Social support  ?Referral From Nurse;Care management  ?Consult/Referral To None  ? ?Chaplain attempted to visit patient, a do not disturb notice is posted.  ?I met with therapist who provided more guidance.  Patient is struggling with sleep,therapy and life requirements balance. Therapist recommended a chaplain visit possibly during dialysis today or on Saturday. I will pass this on to the team.  ? ?Danice Goltz ?Chaplain Resident ?[facility]  ?[phone] ?

## 2022-02-06 NOTE — Progress Notes (Signed)
Physical Therapy Discharge ?Patient Details ?Name: Natasha Long ?MRN: [id number] ?DOB: [birth year] ?Today's Date: 02/06/2022 ?Time: 7017-7939 ?PT Time Calculation (min) (ACUTE ONLY): 16 min ? ?Patient discharged from PT services secondary to patient has refused 3 (three) consecutive times without medical reason. ? ?Please see latest therapy progress note for current level of functioning and progress toward goals.   ? ?Progress and discharge plan discussed with patient and/or caregiver: Patient/Caregiver agrees with plan ? ?GP ?   ? ?Ashelynn Marks F Damarian Priola ?02/06/2022, 2:00 PM  ?[place] M,PT ?Acute Rehab Services ?[phone] ?[phone] (pager)  ?

## 2022-02-06 NOTE — Progress Notes (Signed)
? ?Subjective:  ? ?O/N: No acute overnight events. ? ?States that she cannot work with PT yet. She is not able to move around. Discussed that without working with therapy, she will not be able make much progress and will continue to get weaker.  ? ?Patient tearful during encounter. States that there are not enough pain medications in the world for this. Points to entire patella when asked where the most pain is. ? ? ?Objective: ? ?Vital signs in last 24 hours: ?Vitals:  ? 02/06/22 0409 02/06/22 0418 02/06/22 0553 02/06/22 0939  ?BP:   139/82 (!) 146/82  ?Pulse:   100 100  ?Resp: (!) 21 17 17 17   ?Temp:   97.9 ?F (36.6 ?C) 98.3 ?F (36.8 ?C)  ?TempSrc:   Oral   ?SpO2: 92% 92% 100% 96%  ?Weight:      ?Height:      ? ?Physical Exam ?General: Mart female, lying in bed, in pain but NAD. ?HEENT: Splitting of middle of lower lip, healed. ?CV: Normal rate and regular rhythm on telemetry ?Pulm: Normal work of breathing on room air. ?MSK: Right knee with bandage wrap removed and dressing c/d/i, tender to palpation but less warm around the knee. Still with warmth of leg down to ankle in comparison to LLE. Cries in pain when R leg lifted 30 degrees off of the bed.Left wrist with dressings in place c/d/i, warmth noted, pain with passive ROM.  ?Neuro: AAOx3, no focal deficits noted. ? ?Assessment/Plan: ? ?Principal Problem: ?  Septic arthritis of multiple joints ([place]) ?Active Problems: ?  End stage renal disease ([place]) ?  Knee pain, acute ?  Hyperphosphatemia ?  Effusion of right knee ?  Hyperkalemia ?  Arthritis, septic ([place]) ?  Dialysis patient, noncompliant ([place]) ?  Left wrist pain ?  Group G streptococcal infection ? ?Natasha Long is a 26 year old female with a history of nephrotic syndrome on hemodialysis who has been noncompliant with sessions for the past 2.5 weeks, borderline personality disorder, and recent homelessness who presented with traumatic knee injury after falling down a flight of stairs, found to have  polyarticular septic arthritis. ? ?Acute traumatic knee injury ?Multifocal septic arthritis ?Afebrile overnight, mild leukocytosis noted today.  Postop day 7 after I&D of right knee and left wrist by Ortho, appreciate assistance.  Right knee and left wrist cultures showing group G Streptococcus, awaiting sensitivities; cultures of right hand and forearm with no growth x5 days.  Plan for cefazolin x6 weeks per ID.  Per Ortho, patient is weightbearing as tolerated. CRP increased from 28.6->32.8. ?-Appreciate Ortho and ID assistance ?-IV cefazolin within recommendation of susceptibility testing; continue with HD x 6 weeks (1/6 complete) ?-Tylenol every 8 hours scheduled ?-As patient still with significant pain only mild relief from previously scheduled dilaudid, will initiate Dilaudid PCA Pump, max of 1.25 mg/hr x 48 hours then reassess. Would like for patient to have pain controlled enough to sleep and work with PT/OT. ?-Trend CBC ?- Trend CRP every other day ?-PT/OT signed off due to patient noncompliance.  ? ?ESRD with HD noncompliance ?Hyperphosphatemia 2/2 missed HD ?Hyperkalemia 2/2 missed HD, resolved  ?Patient reports history of nephrotic syndrome.  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  Patient reports noncompliance with hemodialysis due to being unhomed. Multiple presentations at multiple ED's after missing HD. ?-- appreciate nephrology assistance ?- Per nephrology, MWF schedule this week then switch to original TThS schedule next week; recommend patient 2nd shift HD and in the recliner chair for sessions.  ?-  Trend renal function panel daily ? ?Anemia of ESRD ?Acute blood loss anemia ?Status post 3 units PRBCs.  She remains asymptomatic from this.  Hemoglobin stable at 8.9. ?-Aranesp 100 mg with HD per nephro ?-Trend CBC, transfuse if hemoglobin less than 7 ?  ?History of borderline personality disorder ?Major depressive disorder ?Generalized anxiety disorder ?Follows at [facility] for  care.  Home medications: Abilify 10 mg (patient-reported dose, 7.5 documented), gabapentin 300 mg 3 times daily, and hydroxyzine 25 mg 3 times daily as needed. ?- Continue home Abilify 7.5 mg and hydroxyzine 25 mg PRN anxiety ?- TOC consult for unhomed status; appreciate assistance ?  ?Hypertension ?Patient non-adherent with home medications. Improved with HD and home losartan.  ?-- Continue home Losartan 50 mg daily ?-- Monitor daily vitals ? ?Prior to Admission Living Arrangement: Unhomed, awaiting housing at [employer]. ?Anticipated Discharge Location: Pending ?Barriers to Discharge: Unhomed status, continued medical management ?Dispo: To be decided.  ? [name], MD ?02/06/2022, 9:48 AM ?Pager: [phone] ?After 5pm on weekdays and 1pm on weekends: On Call pager [phone]  ?

## 2022-02-06 NOTE — Progress Notes (Signed)
Orthopedic Tech Progress Note ?Patient Details:  ?Sidonie Pavel ?Jan 23, 1996 ?[id number] ? ?Patient refused, yelled at [place] and asked "why the hell y'all cant leave me alone for just one day". Notified RN of the conversation. ? ?CPM Right Knee ?CPM Right Knee: Off ?Right Knee Flexion (Degrees): 0 ?Right Knee Extension (Degrees): 10 ? ?Post Interventions ?Patient Tolerated: Poor ?Instructions Provided: Care of device ? ?Janit Pagan ?02/06/2022, 1:47 PM ? ?

## 2022-02-07 DIAGNOSIS — M0029 Other streptococcal polyarthritis: Secondary | ICD-10-CM | POA: Diagnosis not present

## 2022-02-07 DIAGNOSIS — R52 Pain, unspecified: Secondary | ICD-10-CM

## 2022-02-07 LAB — RENAL FUNCTION PANEL
Albumin: 1.8 g/dL — ABNORMAL LOW (ref 3.5–5.0)
Anion gap: 18 — ABNORMAL HIGH (ref 5–15)
BUN: 48 mg/dL — ABNORMAL HIGH (ref 6–20)
CO2: 23 mmol/L (ref 22–32)
Calcium: 9.7 mg/dL (ref 8.9–10.3)
Chloride: 95 mmol/L — ABNORMAL LOW (ref 98–111)
Creatinine, Ser: 9.5 mg/dL — ABNORMAL HIGH (ref 0.44–1.00)
GFR, Estimated: 5 mL/min — ABNORMAL LOW (ref >60)
Glucose, Bld: 104 mg/dL — ABNORMAL HIGH (ref 70–99)
Phosphorus: 9.3 mg/dL — ABNORMAL HIGH (ref 2.5–4.6)
Potassium: 4.3 mmol/L (ref 3.5–5.1)
Sodium: 136 mmol/L (ref 135–145)

## 2022-02-07 LAB — CBC
HCT: 28.6 % — ABNORMAL LOW (ref 36.0–46.0)
Hemoglobin: 8.9 g/dL — ABNORMAL LOW (ref 12.0–15.0)
MCH: 27.6 pg (ref 26.0–34.0)
MCHC: 31.1 g/dL (ref 30.0–36.0)
MCV: 88.8 fL (ref 80.0–100.0)
Platelets: 358 10*3/uL (ref 150–400)
RBC: 3.22 MIL/uL — ABNORMAL LOW (ref 3.87–5.11)
RDW: 16.7 % — ABNORMAL HIGH (ref 11.5–15.5)
WBC: 10.9 10*3/uL — ABNORMAL HIGH (ref 4.0–10.5)
nRBC: 0 % (ref 0.0–0.2)

## 2022-02-07 LAB — C-REACTIVE PROTEIN: CRP: 44.1 mg/dL — ABNORMAL HIGH (ref <1.0)

## 2022-02-07 MED ORDER — HYDROMORPHONE HCL 2 MG PO TABS: 2.0000 mg | ORAL_TABLET | ORAL | Status: DC

## 2022-02-07 MED ORDER — DARBEPOETIN ALFA 100 MCG/0.5ML IJ SOSY
100.0000 ug | PREFILLED_SYRINGE | Freq: Once | INTRAMUSCULAR | Status: DC
  Filled 2022-02-07: qty 0.5

## 2022-02-07 MED ORDER — HYDROMORPHONE HCL 1 MG/ML IJ SOLN
1.0000 mg | INTRAMUSCULAR | Status: DC | PRN
  Administered 2022-02-07 – 2022-02-09 (×11): 1 mg via INTRAVENOUS
  Filled 2022-02-07 (×11): qty 1

## 2022-02-07 MED ORDER — HYDROMORPHONE HCL 2 MG PO TABS: 4.0000 mg | ORAL_TABLET | ORAL | Status: DC

## 2022-02-07 MED ORDER — HYDROMORPHONE HCL 1 MG/ML IJ SOLN
1.0000 mg | INTRAMUSCULAR | Status: AC
  Administered 2022-02-07 – 2022-02-08 (×9): 1 mg via INTRAVENOUS
  Filled 2022-02-07 (×9): qty 1

## 2022-02-07 MED ORDER — HYDROMORPHONE HCL 2 MG PO TABS
4.0000 mg | ORAL_TABLET | ORAL | Status: DC
  Administered 2022-02-07: 4 mg via ORAL
  Filled 2022-02-07: qty 2

## 2022-02-07 MED ORDER — HYDROMORPHONE HCL 1 MG/ML IJ SOLN: 1.0000 mg | Freq: Four times a day (QID) | INTRAMUSCULAR | Status: DC | PRN

## 2022-02-07 MED ORDER — HYDROMORPHONE HCL 1 MG/ML IJ SOLN: 1.0000 mg | INTRAMUSCULAR | Status: DC

## 2022-02-07 MED ORDER — HYDROMORPHONE HCL 2 MG PO TABS
4.0000 mg | ORAL_TABLET | ORAL | Status: DC
Start: 2022-02-07 — End: 2022-02-11
  Administered 2022-02-07 – 2022-02-11 (×24): 4 mg via ORAL
  Filled 2022-02-07 (×25): qty 2

## 2022-02-07 NOTE — Progress Notes (Signed)
? ?Subjective:  ? ?O/N: PT/OT signed off due to patient refusal to work with them. RN advised that she was witnessed tampering with the PCA pump. Patient also refused labs this AM. ? ?States that she did not sleep last night due to pump alarms ringing. Patient informed that due to safety concerns and moving in the direction of discharging regimen, she will be switched to PO Dilaudid with IV PRN for breakthrough pain. She was unhappy and began yelling, stating that she only wanted IV pain medications.  ? ? ?Objective: ? ?Vital signs in last 24 hours: ?Vitals:  ? 02/07/22 0047 02/07/22 0304 02/07/22 0550 02/07/22 0826  ?BP:   135/70   ?Pulse:   (!) 105   ?Resp: 16 20 18 20   ?Temp:   98.9 ?F (37.2 ?C)   ?TempSrc:   Oral   ?SpO2: 98% 96% 96%   ?Weight:      ?Height:      ? ?Physical Exam ?General: [place] female, lying in bed, in NAD. ?HEENT: Splitting of middle of lower lip, healed. ?CV: Normal rate and regular rhythm on telemetry ?Pulm: Normal work of breathing on room air. ?MSK: Patient would not cooperate for assessment today ?Neuro: AAOx3, no focal deficits noted. ?Psych: Irritable mood and agitated affect ? ?Assessment/Plan: ? ?Principal Problem: ?  Septic arthritis of multiple joints ([place]) ?Active Problems: ?  End stage renal disease ([place]) ?  Knee pain, acute ?  Hyperphosphatemia ?  Effusion of right knee ?  Hyperkalemia ?  Arthritis, septic ([place]) ?  Dialysis patient, noncompliant ([place]) ?  Left wrist pain ?  Group G streptococcal infection ? ?Annlouise is a 26 year old female with a history of nephrotic syndrome on hemodialysis who has been noncompliant with sessions for the past 2.5 weeks, borderline personality disorder, and recent homelessness who presented with traumatic knee injury after falling down a flight of stairs, found to have polyarticular septic arthritis. ? ?Acute traumatic knee injury ?Multifocal septic arthritis ?Remained afebrile overnight and without leukocytosis.  Postop day 7 after I&D of  right knee and left wrist by Ortho, appreciate assistance.  Right knee and left wrist cultures showing group G Streptococcus; cultures of right hand and forearm with no growth x5 days.  Plan for cefazolin x6 weeks per ID.  Per Ortho, patient is weightbearing as tolerated. CRP increased from 28.6->32.8. Patient's pain had not been controlled post-op, so PCA pump initiated on 3/9. Patient witnessed by nursing staff attempting to tamper with PCA pump, so will discontinue at this time.  ?-Appreciate Ortho and ID assistance ?-IV cefazolin within recommendation of susceptibility testing; continue with HD x 6 weeks (2/6 complete) ?-Tylenol every 8 hours scheduled ?-PCA d/c. Pain regimen: PO Dilaudid 4 mg q4h and IV Dilaudid 1 mg q4h PRN breakthrough pain. Note: Patient is not to receive IV PRN unless she takes PO dose. She was educated on the fact that we would begin the transition to finding post-discharge pain regimen. ?-Trend CBC ?- Trend CRP every other day ?-PT/OT signed off due to patient noncompliance.  ? ?ESRD with HD noncompliance ?Hyperphosphatemia 2/2 missed HD ?Hyperkalemia 2/2 missed HD, resolved  ?Patient reports history of nephrotic syndrome.  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  Patient reports noncompliance with hemodialysis due to being unhomed. Multiple presentations at multiple ED's after missing HD. ?-- appreciate nephrology assistance ?- Per nephrology, will switch to original TThS schedule next week; recommend patient 2nd shift HD and in the recliner chair for sessions.  ? ?Anemia of ESRD ?  Acute blood loss anemia ?Status post 3 units PRBCs.  She remains asymptomatic from this.  Hemoglobin stable at 8.9. ?-Aranesp 100 mg with HD per nephro ?-Trend CBC, transfuse if hemoglobin less than 7 ?  ?History of borderline personality disorder ?Major depressive disorder ?Generalized anxiety disorder ?Follows at [facility] for care.  Home medications: Abilify 10 mg (patient-reported  dose, 7.5 documented), gabapentin 300 mg 3 times daily, and hydroxyzine 25 mg 3 times daily as needed. ?- Continue home Abilify 7.5 mg and hydroxyzine 25 mg PRN anxiety ?- TOC consult for unhomed status; appreciate assistance ?  ?Hypertension ?Patient non-adherent with home medications. Improved with HD and home losartan.  ?-- Continue home Losartan 50 mg daily ?-- Monitor daily vitals ? ?Prior to Admission Living Arrangement: Unhomed, awaiting housing at [employer]. ?Anticipated Discharge Location: Pending ?Barriers to Discharge: Unhomed status, patient not participating with PT/OT  ?Dispo: To be decided.  ? ?[name], MD ?02/07/2022, 8:45 AM ?Pager: [phone] ?After 5pm on weekdays and 1pm on weekends: On Call pager [phone]  ?

## 2022-02-07 NOTE — Plan of Care (Signed)
Patient with updated pain regimen as follows:  ? ?Dilaudid 4 mg PO AND 1 mg IV q4h through 3/12 to allow for steady state of PO dose to be achieved.  ? ?Dilaudid 1 mg IV q4h PRN for breakthrough pain through 3/12. ? ?Only give IV dose of scheduled Dilaudid if taking PO dose.  ? ?Only give IV PRN if taking scheduled Tylenol.  ? ?All IV Dilaudid doses to be d/c'd after final doses 3/12. ? ?[name], MD ?Internal Medicine/Psych Resident ?PGY-1 ?02/07/2022  12:03 PM ?Pager: [phone] ?

## 2022-02-07 NOTE — Progress Notes (Signed)
Wasted remainder of Dilaudid from PCA pump, which was 3mg /84mL with Percell Boston RN at this time into stericycle.  ? ?Foster Simpson Colinda Barth ? ?

## 2022-02-07 NOTE — Progress Notes (Signed)
Pharmacy Antibiotic Note ? ?Natasha Long is a 26 y.o. female admitted on 01/28/2022 with  knee pain following traumatic fall with concern for septic joint .  Pharmacy has been consulted for cefazolin dosing. ? ?Last HD 3/10  ? ?Plan: ?Cefazolin 2g with HD- currently MWF (off-schedule)  ?F/U HD schedule  ?Plan for 6 weeks of antibiotic therapy per ID  ? ?Height: 5\' 8"  (172.7 cm) ?Weight: 100.6 kg (221 lb 12.5 oz) ?IBW/kg (Calculated) : 63.9 ? ?Temp (24hrs), Avg:98.3 ?F (36.8 ?C), Min:97.7 ?F (36.5 ?C), Max:98.9 ?F (37.2 ?C) ? ?Recent Labs  ?Lab 02/02/22 ?0904 02/02/22 ?1623 02/03/22 ?0429 02/04/22 ?0263 02/05/22 ?7858 02/06/22 ?8502  ?WBC 9.5 9.9 11.3* 11.3* 9.8 9.2  ?CREATININE 15.65*  --  10.14* 12.26* 10.61* 12.87*  ? ?  ?Estimated Creatinine Clearance: 8.1 mL/min (A) (by C-G formula based on SCr of 12.87 mg/dL (H)).   ? ?Allergies  ?Allergen Reactions  ? Prozac [Fluoxetine Hcl] Other (See Comments)  ?  Panic attack   ? Wellbutrin [Bupropion] Other (See Comments)  ?  Panic attack  ? Prednisone Other (See Comments)  ?  Pt states that this med caused pancreatitis.   ? ?3/3 Bcx: negative  ?3/3 R Knee synovial fluid: Group G Strep (S: ceftriaxone) ?3/3 L  wrist synovial fluid: moderate Group C Strep ? ?Vanco 3/3 >> 3/5 ?Cefepime 3/3 >>3/3 ?Cefazolin 3/4>> ? ?Adria Dill, PharmD ?PGY-1 Acute Care Resident  ?02/07/2022 8:06 AM  ? ? ?

## 2022-02-07 NOTE — Progress Notes (Signed)
Wasted 18ml  from PCA.  Wasted with Gwenlyn Perking. ?

## 2022-02-07 NOTE — Progress Notes (Signed)
Pt refused mobility. Sheets are all over the bed and pt does not want them straightened out either. Pt just wanted her medications and the light turned back out. Pt is polite however, she is just refusing to be touched. This RN barely touched pt's left hand and she jumped and stated that it hurt too bad, even though pt stated that her knee only hurt when RN did the pain assessment for the Dilaudid. Pt is resting comfortably in bed at this time. RN will continue to monitor pt.  ? ?Foster Simpson Rod Majerus ? ?

## 2022-02-07 NOTE — Progress Notes (Signed)
This RN tried to reposition pt's legs and to fix the covers and her in the bed, but pt stated she was tired and did not want this nurse to move her legs or the covers. Pt just told this nurse to add another blanket to cover her legs and to let her rest. This RN will continue to monitor pt.  ? ?Natasha Long Natasha Long ? ?

## 2022-02-07 NOTE — Progress Notes (Signed)
Patient complaining of pain and medication not due  offered ice and heat and patient refused both.  Offered to reposition patient refused. ? ?Had also called at1030 and let patient know paim medication not due.   ?

## 2022-02-07 NOTE — Progress Notes (Signed)
?[facility] ?Progress Note  ? ?Subjective: Seen in room. PCA meds discontinued earlier. Not happy. Short HD today to resume T,Th,S Schedule.    ? ?Objective ?Vitals:  ? 02/07/22 0550 02/07/22 0826 02/07/22 0850 02/07/22 0947  ?BP: 135/70  139/80   ?Pulse: (!) 105  (!) 106   ?Resp: 18 20 17 17   ?Temp: 98.9 ?F (37.2 ?C)  98.4 ?F (36.9 ?C)   ?TempSrc: Oral     ?SpO2: 96%  96%   ?Weight:      ?Height:      ? ?Physical Exam ?General:Younger female in NAD ?Heart: S1,S2 RRR No M/R/G ?Lungs:CTAB ?Abdomen: NABS, NT, ND ?Extremities: Trace BLE. Drsg intact L hand. Opsite drsg R knee.  ?Dialysis Access: L AVF + T/B  ? ?Additional Objective ?Labs: ?Basic Metabolic Panel: ?Recent Labs  ?Lab 02/05/22 ?1610 02/06/22 ?9604 02/07/22 ?1024  ?NA 137 136 136  ?K 4.0 4.8 4.3  ?CL 98 96* 95*  ?CO2 24 22 23   ?GLUCOSE 125* 88 104*  ?BUN 58* 77* 48*  ?CREATININE 10.61* 12.87* 9.50*  ?CALCIUM 9.1 9.4 9.7  ?PHOS 8.9* 10.7* 9.3*  ? ?Liver Function Tests: ?Recent Labs  ?Lab 02/05/22 ?5409 02/06/22 ?8119 02/07/22 ?1024  ?ALBUMIN 1.8* 1.7* 1.8*  ? ?No results for input(s): LIPASE, AMYLASE in the last 168 hours. ?CBC: ?Recent Labs  ?Lab 02/02/22 ?1623 02/03/22 ?0429 02/04/22 ?1478 02/05/22 ?2956 02/06/22 ?2130 02/07/22 ?1024  ?WBC 9.9 11.3* 11.3* 9.8 9.2 10.9*  ?NEUTROABS 8.0*  --   --   --   --   --   ?HGB 9.0* 9.1* 8.9* 8.8* 8.9* 8.9*  ?HCT 26.1* 26.8* 27.3* 27.1* 28.3* 28.6*  ?MCV 84.5 85.9 87.5 88.3 90.1 88.8  ?PLT 206 226 266 274 324 358  ? ?Blood Culture ?   ?Component Value Date/Time  ? SDES WOUND 01/30/2022 1249  ? SPECREQUEST LEFT WRIST SYNOVIAL FLUID SPEC B 01/30/2022 1249  ? CULT  01/30/2022 1249  ?  MODERATE STREPTOCOCCUS GROUP G ?Beta hemolytic streptococci are predictably susceptible to penicillin and other beta lactams. Susceptibility testing not routinely performed. ?NO ANAEROBES ISOLATED ?Performed at [facility], [place] [address] [place], [place] [address] ?  ? REPTSTATUS 02/04/2022 FINAL 01/30/2022 1249   ? ? ?Cardiac Enzymes: ?No results for input(s): CKTOTAL, CKMB, CKMBINDEX, TROPONINI in the last 168 hours. ?CBG: ?No results for input(s): GLUCAP in the last 168 hours. ?Iron Studies: No results for input(s): IRON, TIBC, TRANSFERRIN, FERRITIN in the last 72 hours. ?@lablastinr3 @ ?Studies/Results: ?No results found. ?Medications: ? sodium chloride    ? sodium chloride    ? sodium chloride 10 mL/hr at 01/30/22 1700  ?  ceFAZolin (ANCEF) IV 2 g (02/06/22 2035)  ? ? acetaminophen  1,000 mg Oral Q8H  ? ARIPiprazole  7.5 mg Oral Daily  ? Chlorhexidine Gluconate Cloth  6 each Topical Q0600  ? darbepoetin (ARANESP) injection - DIALYSIS  100 mcg Intravenous Q Sat-HD  ? darbepoetin (ARANESP) injection - NON-DIALYSIS  100 mcg Subcutaneous Once  ? heparin injection (subcutaneous)  5,000 Units Subcutaneous Q8H  ? HYDROmorphone  4 mg Oral Q4H  ? And  ?  HYDROmorphone (DILAUDID) injection  1 mg Intravenous Q4H  ? losartan  50 mg Oral Daily  ? multivitamin  1 tablet Oral Daily  ? pantoprazole  40 mg Oral Daily  ? senna-docusate  1 tablet Oral BID  ? sucroferric oxyhydroxide  1,000 mg Oral TID WC  ? ? ? ?Dialysis Orders: ?[facility], TTS, 3.5  hs,BFR 350 / Auto flow DFR ?2K, 2.5 CA EDW 110kg ?Heparin 5000 units ?Mircera 100 mcg last given 11/20/01 ?Calcitriol 1.75 mcg p.o. q. HD  ?Venofer 50 MG q. weekly  ?LUA AVF ?  ?Assessment/Plan: ?Altered mental status with severe uremia (BUN-158 creatinine 30) 2/2 missed OP HD ,last HD 01/08/2022. Received some serial Hds- mental status improved.  ?ESRD -T,Th,S. Has been off schedule D/T staffing and census issues. Short HD today to resume T,Th,S Schedule. Next HD 02/10/2022.  ?Hypertension/volume-BP variable. Resetting EDW. UF as tolerated.  ?Right knee pain //infection /status post knee irrigation and debridement 3/04. R knee cx (+) group g streptococcus. WBC trending back up,  blood cx (-) X 5 days. Ortho and ID following-continue Cefazolin ?Possible Left wrist infection status post I&D by hand  surgeon 3/03. Reviewed cultures: (+) Group G streptococcus ?Anemia  -S/p 3 units PRBCs during this hospitalization. Hgb improved-now 8.9. ESA  recently given 3/4. Avoid iron with infection picture ?Metabolic bone disease -Corr calcium over 10, phos is back up. Will switch binders to Velphoro today. Continue to hold vitamin D for now until CA improved. Use low Ca bath. ?Nutrition -albumin 2.2 protein supplements with renal failure diet renal vitamin when taking p.o.'s ?History of borderline personality disorder, major depressive order, generalized anxiety/last reported homeless- plan per admit team ?Dispo-Spoke to patient who expresses the reasoning for frequent missed HD treatments was d/t transportation issues. She reports currently not having a place to live. Discussed with Primary team-SNF placement has already been in progress.  ? ?Jimmye Norman. Lilyauna Miedema NP-C ?02/07/2022, 11:47 AM  ?[facility] ?[phone] ? ? ?  ? ?

## 2022-02-07 NOTE — TOC Progression Note (Signed)
Transition of Care (TOC) - Progression Note  ? ? ?Patient Details  ?Name: Billiejean Schimek ?MRN: [id number] ?Date of Birth: 1996/02/25 ? ?Transition of Care (TOC) CM/SW Contact  ?Tanish Prien Renold Don, LCSWA ?Phone Number: ?02/07/2022, 1:34 PM ? ?Clinical Narrative:    ?CSW followed up on pt bed offer, there are no offers yet. CSW will continue to follow for DC planning needs. ? ?  ?Barriers to Discharge: Continued Medical Work up, Homeless with medical needs ? ?Expected Discharge Plan and Services ?  ?  ?  ?  ?Living arrangements for the past 2 months: Homeless ?                ?  ?  ?  ?  ?  ?  ?  ?  ?  ?  ? ? ?Social Determinants of Health (SDOH) Interventions ?  ? ?Readmission Risk Interventions ?No flowsheet data found. ? ?

## 2022-02-08 DIAGNOSIS — N186 End stage renal disease: Secondary | ICD-10-CM | POA: Diagnosis not present

## 2022-02-08 DIAGNOSIS — Z992 Dependence on renal dialysis: Secondary | ICD-10-CM | POA: Diagnosis not present

## 2022-02-08 DIAGNOSIS — M0029 Other streptococcal polyarthritis: Secondary | ICD-10-CM | POA: Diagnosis not present

## 2022-02-08 DIAGNOSIS — R52 Pain, unspecified: Secondary | ICD-10-CM | POA: Diagnosis not present

## 2022-02-08 LAB — RENAL FUNCTION PANEL
Albumin: 1.7 g/dL — ABNORMAL LOW (ref 3.5–5.0)
Anion gap: 14 (ref 5–15)
BUN: 34 mg/dL — ABNORMAL HIGH (ref 6–20)
CO2: 27 mmol/L (ref 22–32)
Calcium: 9.3 mg/dL (ref 8.9–10.3)
Chloride: 93 mmol/L — ABNORMAL LOW (ref 98–111)
Creatinine, Ser: 7.25 mg/dL — ABNORMAL HIGH (ref 0.44–1.00)
GFR, Estimated: 7 mL/min — ABNORMAL LOW (ref >60)
Glucose, Bld: 98 mg/dL (ref 70–99)
Phosphorus: 7.9 mg/dL — ABNORMAL HIGH (ref 2.5–4.6)
Potassium: 3.9 mmol/L (ref 3.5–5.1)
Sodium: 134 mmol/L — ABNORMAL LOW (ref 135–145)

## 2022-02-08 LAB — CBC
HCT: 29.2 % — ABNORMAL LOW (ref 36.0–46.0)
Hemoglobin: 9 g/dL — ABNORMAL LOW (ref 12.0–15.0)
MCH: 27.7 pg (ref 26.0–34.0)
MCHC: 30.8 g/dL (ref 30.0–36.0)
MCV: 89.8 fL (ref 80.0–100.0)
Platelets: 323 10*3/uL (ref 150–400)
RBC: 3.25 MIL/uL — ABNORMAL LOW (ref 3.87–5.11)
RDW: 16.7 % — ABNORMAL HIGH (ref 11.5–15.5)
WBC: 10.7 10*3/uL — ABNORMAL HIGH (ref 4.0–10.5)
nRBC: 0 % (ref 0.0–0.2)

## 2022-02-08 NOTE — Progress Notes (Signed)
01-30-22 presumed L septic wrist I&D ? ?Patient with persistent pain at L wrist and R knee that has been severe from beginning, making determination regarding response to treatment very difficult on basis of exam ? ?Left wrist incision with no drainage, absorbably sutures remain in place ?Low-grade stable leukocytosis with upward trending CRP ?Intermittently also febrile ?  ?Recommendations: ? ?At this time, I recommend continuing efforts to mobilize the wrist while tapering pain meds.   I find it impossible to determine whether her septic wrist is resolving or not on the basis of exam.  Following temps and lab data can provide some indication, but even in the face of worsening data, localization of the problem can be difficult when it is multi-focal.  There could even be foci of infection not yet discovered.  None-the-less, if the possibility of ongoing unresolving infection in the wrist is considered, probably the single-best reliable test to determine if repeat surgical drainage may be indicated would be wrist aspiration for gram stain/culture under fluoroscopy by VIR.  ? ?The sutures at the wrist do not need to be removed, but will fall away on their own.  Given how reluctant she has been to engage in ROM with her wrist and digits, significant stiffness is probably unavoidable.  In addition, even though her wrist underwent I&D, the infective process by that point may have already done significant, irreversible damage to the articular cartilage which may result in painful post-infectious arthritis of the wrist even after the infection resolves.  Of course, septic arthritis can also lead to osteomyelitis of the involved osseous structures.   ? ?Resolving deep infections does not lie primarily within the scope of expertise of hand surgery, aside from providing drainage as requested by the primary treatment team to debulk the infective load, remove compromised tissue, and provide deep samples for culture.  I will sign  off at this point, but remain available as questions or requests for services arise, except I am away this coming week ? ?Micheline Rough, MD ?Hand Surgery ? ?  ?  ? ?

## 2022-02-08 NOTE — Progress Notes (Addendum)
?[facility] ?Progress Note  ? ?Subjective: Has resumed T,Th,S schedule. Next HD 02/08/2022. Agrees to get bath and get up in chair today. No C/Os pain. Agrees to resume PT. ? ?Objective ?Vitals:  ? 02/07/22 2009 02/07/22 2344 02/08/22 0553 02/08/22 0950  ?BP: (!) 117/52  (!) 142/83 132/76  ?Pulse: (!) 104  100 100  ?Resp: 18  18 18   ?Temp: (!) 101 ?F (38.3 ?C) 98.7 ?F (37.1 ?C) 98.4 ?F (36.9 ?C) 98 ?F (36.7 ?C)  ?TempSrc: Oral Oral Oral Oral  ?SpO2: 97%  95% 98%  ?Weight:      ?Height:      ? ?Physical Exam ?General:Younger female in NAD ?Heart: S1,S2 RRR No M/R/G ?Lungs:CTAB ?Abdomen: NABS, NT, ND ?Extremities: Trace BLE. Drsg intact L hand. Opsite drsg R knee.  ?Dialysis Access: L AVF + T/B  ? ? ?Additional Objective ?Labs: ?Basic Metabolic Panel: ?Recent Labs  ?Lab 02/06/22 ?0737 02/07/22 ?1024 02/08/22 ?0116  ?NA 136 136 134*  ?K 4.8 4.3 3.9  ?CL 96* 95* 93*  ?CO2 22 23 27   ?GLUCOSE 88 104* 98  ?BUN 77* 48* 34*  ?CREATININE 12.87* 9.50* 7.25*  ?CALCIUM 9.4 9.7 9.3  ?PHOS 10.7* 9.3* 7.9*  ? ?Liver Function Tests: ?Recent Labs  ?Lab 02/06/22 ?0737 02/07/22 ?1024 02/08/22 ?0116  ?ALBUMIN 1.7* 1.8* 1.7*  ? ?No results for input(s): LIPASE, AMYLASE in the last 168 hours. ?CBC: ?Recent Labs  ?Lab 02/02/22 ?1623 02/03/22 ?0429 02/04/22 ?5520 02/05/22 ?8022 02/06/22 ?3361 02/07/22 ?1024 02/08/22 ?0116  ?WBC 9.9   < > 11.3* 9.8 9.2 10.9* 10.7*  ?NEUTROABS 8.0*  --   --   --   --   --   --   ?HGB 9.0*   < > 8.9* 8.8* 8.9* 8.9* 9.0*  ?HCT 26.1*   < > 27.3* 27.1* 28.3* 28.6* 29.2*  ?MCV 84.5   < > 87.5 88.3 90.1 88.8 89.8  ?PLT 206   < > 266 274 324 358 323  ? < > = values in this interval not displayed.  ? ?Blood Culture ?   ?Component Value Date/Time  ? SDES WOUND 01/30/2022 1249  ? SPECREQUEST LEFT WRIST SYNOVIAL FLUID SPEC B 01/30/2022 1249  ? CULT  01/30/2022 1249  ?  MODERATE STREPTOCOCCUS GROUP G ?Beta hemolytic streptococci are predictably susceptible to penicillin and other beta lactams.  Susceptibility testing not routinely performed. ?NO ANAEROBES ISOLATED ?Performed at [facility], [place] [address], [place],  [address] ?  ? REPTSTATUS 02/04/2022 FINAL 01/30/2022 1249  ? ? ?Cardiac Enzymes: ?No results for input(s): CKTOTAL, CKMB, CKMBINDEX, TROPONINI in the last 168 hours. ?CBG: ?No results for input(s): GLUCAP in the last 168 hours. ?Iron Studies: No results for input(s): IRON, TIBC, TRANSFERRIN, FERRITIN in the last 72 hours. ?@lablastinr3 @ ?Studies/Results: ?No results found. ?Medications: ? sodium chloride    ? sodium chloride    ? sodium chloride 10 mL/hr at 01/30/22 1700  ?  ceFAZolin (ANCEF) IV 2 g (02/06/22 2035)  ? ? acetaminophen  1,000 mg Oral Q8H  ? ARIPiprazole  7.5 mg Oral Daily  ? Chlorhexidine Gluconate Cloth  6 each Topical Q0600  ? darbepoetin (ARANESP) injection - DIALYSIS  100 mcg Intravenous Q Sat-HD  ? heparin injection (subcutaneous)  5,000 Units Subcutaneous Q8H  ? HYDROmorphone  4 mg Oral Q4H  ? And  ?  HYDROmorphone (DILAUDID) injection  1 mg Intravenous Q4H  ? losartan  50 mg Oral Daily  ? multivitamin  1  tablet Oral Daily  ? pantoprazole  40 mg Oral Daily  ? senna-docusate  1 tablet Oral BID  ? sucroferric oxyhydroxide  1,000 mg Oral TID WC  ? ? ? ?Dialysis Orders: ?[facility], TTS, 3.5 hs,BFR 350 / Auto flow DFR ?2K, 2.5 CA EDW 110kg ?Heparin 5000 units ?Mircera 100 mcg last given 11/20/01 ?Calcitriol 1.75 mcg p.o. q. HD  ?Venofer 50 MG q. weekly  ?LUA AVF ?  ?Assessment/Plan: ?Altered mental status with severe uremia (BUN-158 creatinine 30) 2/2 missed OP HD ,last HD 01/08/2022. Received some serial Hds- mental status improved.  ?ESRD -T,Th,S. Has been off schedule D/T staffing and census issues. Short HD today to resume T,Th,S Schedule. Next HD 02/10/2022.  ?Hypertension/volume-BP variable. Resetting EDW. UF as tolerated.  ?Right knee pain //infection /status post knee irrigation and debridement 3/04. R knee cx (+) group g streptococcus. WBC trending back up,   blood cx (-) X 5 days. Ortho and ID following-continue Cefazolin ?Possible Left wrist infection status post I&D by hand surgeon 3/03. Reviewed cultures: (+) Group G streptococcus ?Anemia  -S/p 3 units PRBCs during this hospitalization. Hgb improved-now 8.9. ESA  recently given 3/4. Avoid iron with infection picture ?Metabolic bone disease -Corrected Ca+ > 10.  PO4 coming down. Switched to [employer] binders, 2.0 Ca+ bath. Hold VDRA.  ?Nutrition -albumin 2.2 protein supplements , Renal diet. Fluid restrictions.  ?History of borderline personality disorder, major depressive order, generalized anxiety/last reported homeless- plan per admit team ?Dispo-Spoke to patient who expresses the reasoning for frequent missed HD treatments was d/t transportation issues. She reports currently not having a place to live. Discussed with Primary team-SNF placement has already been in progress.  ? ?Jimmye Norman. Paton Crum NP-C ?02/08/2022, 10:47 AM  ?[facility] ?[phone] ? ? ?  ? ?

## 2022-02-08 NOTE — Progress Notes (Signed)
Patient refused to have dressing changed multiple times during shift after pain medication was given. ?

## 2022-02-08 NOTE — Progress Notes (Signed)
Dressing on right wrist removed  no drainage ?

## 2022-02-08 NOTE — Plan of Care (Signed)
?  Problem: Education: ?Goal: Knowledge of General Education information will improve ?Description: Including pain rating scale, medication(s)/side effects and non-pharmacologic comfort measures ?Outcome: Progressing ?  ?Problem: Health Behavior/Discharge Planning: ?Goal: Ability to manage health-related needs will improve ?Outcome: Progressing ?  ?Problem: Clinical Measurements: ?Goal: Ability to maintain clinical measurements within normal limits will improve ?Outcome: Progressing ?Goal: Will remain free from infection ?Outcome: Progressing ?  ?Problem: Activity: ?Goal: Risk for activity intolerance will decrease ?Outcome: Progressing ?  ?Problem: Pain Managment: ?Goal: General experience of comfort will improve ?Outcome: Progressing ?  ?Problem: Safety: ?Goal: Ability to remain free from injury will improve ?Outcome: Progressing ?  ?

## 2022-02-08 NOTE — Progress Notes (Signed)
Pt refused dressing to left wrist.  ? ?Foster Simpson Legend Pecore  ?

## 2022-02-08 NOTE — Progress Notes (Addendum)
? ?Subjective:  ? ?Spiked fever of 101F overnight, afebrile thereafter. No other acute overnight events. ? ?Patient states that she is not being heard by anyone and that her pain is severe. States that when her pain subsides, she is disturbed by hospital staff for vitals, etc. RN in room, discussed that staff primarily enters room for pain medication and vital checks during that time given needs for opiate medications. Patient states that she understands. ? ?Patient notes that she still is feeling tired and unable to move around much. Discussed that next goal is to slowly wean IV pain medications with goal of making her more functional. She is resistant to this, but discussed that prolonged IV pain medication use will not be possible. She is continuing to struggle with understanding that pain medication is only to make her more functional and will not completely make her pain-free. ? ? ?Objective: ? ?Vital signs in last 24 hours: ?Vitals:  ? 02/07/22 2009 02/07/22 2344 02/08/22 0553 02/08/22 0950  ?BP: (!) 117/52  (!) 142/83 132/76  ?Pulse: (!) 104  100 100  ?Resp: 18  18 18   ?Temp: (!) 101 ?F (38.3 ?C) 98.7 ?F (37.1 ?C) 98.4 ?F (36.9 ?C) 98 ?F (36.7 ?C)  ?TempSrc: Oral Oral Oral Oral  ?SpO2: 97%  95% 98%  ?Weight:      ?Height:      ? ?Physical Exam ?General: young female, lying in bed, NAD. ?CV: normal rate and regular rhythm. ?Pulm: normal work of breathing on RA. ?MSK: Able to move LLE and RUE without difficulty and against gravity. RLE and LUE remained motionless during encounter. Unable to assess for warmth, tenderness, swelling due to refusal. ?Neuro: alert and oriented, no focal deficits noted. ?Psych: irritable mood and agitated affect ? ?Assessment/Plan: ? ?Principal Problem: ?  Septic arthritis of multiple joints ([place]) ?Active Problems: ?  End stage renal disease ([place]) ?  Knee pain, acute ?  Hyperphosphatemia ?  Effusion of right knee ?  Hyperkalemia ?  Arthritis, septic ([name]) ?  Dialysis patient,  noncompliant ([place]) ?  Left wrist pain ?  Group G streptococcal infection ?  Pain management ? ?Kamora is a 26 year old female with a history of nephrotic syndrome on hemodialysis who has been noncompliant with sessions for the past 2.5 weeks, borderline personality disorder, and recent homelessness who presented with traumatic knee injury after falling down a flight of stairs, found to have polyarticular septic arthritis. ? ?Acute traumatic knee injury ?Multifocal septic arthritis ?Patient spiked a fever overnight. CBC showing stable mild leukocytosis. Postop day 9 s/p I&D of right knee and left wrist by ortho, appreciate assistance. Cultures showing group G strep. Patient on cefazolin x6 weeks per ID, appreciate assistance. She is WBAT per ortho, but has not worked with PT/OT and they have signed off due to repeated refusals. Biggest issue during stay has been pain control. Repeated discussions about pain medication being in place to make her more functional have not been successful. Has been transitioned off of PCA dilaudid pump and plan is to continue weaning off of IV dilaudid as well. Pain control will be primarily with oral medications from tomorrow. Will re-consult PT/OT for mobilization. ?-IV cefazolin with HD x6 weeks (end date approximately 4/15) ?-tylenol q8 scheduled ?-PO dilaudid 4mg  q4h ?-IV dilaudid 1mg  q4h (only if taking PO dilaudid), last day today ?-IV dilaudid 1mg  q4h PRN for breakthrough, last day today ?-trend CBC qd, CRP q48h ?-consult PT/OT again ? ?ESRD with HD noncompliance ?Hyperphosphatemia 2/2 missed  HD ?Hyperkalemia 2/2 missed HD, resolved  ?Patient reports history of nephrotic syndrome.  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  Patient reports noncompliance with hemodialysis due to being unhomed. Multiple presentations at multiple ED's after missing HD. ?-- appreciate nephrology assistance ?- short HD session today per nephro, plan to transition patient back to TTS schedule starting  3/14 ? ?Anemia of ESRD ?Acute blood loss anemia ?Status post 3 units PRBCs.  She remains asymptomatic from this. Hemoglobin has remained stable. ?-Aranesp 100 mg with HD per nephro ?-Trend CBC, transfuse if hemoglobin less than 7 ?  ?History of borderline personality disorder ?Major depressive disorder ?Generalized anxiety disorder ?Follows at [facility] for care.  Home medications: Abilify 10 mg (patient-reported dose, 7.5 documented), gabapentin 300 mg 3 times daily, and hydroxyzine 25 mg 3 times daily as needed. ?- Continue home Abilify 7.5 mg and hydroxyzine 25 mg PRN anxiety ?- TOC consult for unhomed status; appreciate assistance ?  ?Hypertension ?Patient non-adherent with home medications. Improved with HD and home losartan.  ?-- Continue home Losartan 50 mg daily ?-- Monitor daily vitals ? ?Prior to Admission Living Arrangement: Unhomed, awaiting housing at [employer]. ?Anticipated Discharge Location: Pending ?Barriers to Discharge: Unhomed status, patient not participating with PT/OT  ?Dispo: To be decided.  ? ?Virl Axe, MD ?02/08/2022, 10:33 AM ?Pager: [phone] ?After 5pm on weekdays and 1pm on weekends: On Call pager [phone]  ?

## 2022-02-08 NOTE — Plan of Care (Signed)
?  Problem: Activity: ?Goal: Risk for activity intolerance will decrease ?Outcome: Not Progressing ?  ?Problem: Pain Managment: ?Goal: General experience of comfort will improve ?Outcome: Not Progressing ? Patient contiues to call for pain medication but is sleeping between administrations ?

## 2022-02-09 DIAGNOSIS — M0029 Other streptococcal polyarthritis: Secondary | ICD-10-CM | POA: Diagnosis not present

## 2022-02-09 DIAGNOSIS — B954 Other streptococcus as the cause of diseases classified elsewhere: Secondary | ICD-10-CM | POA: Diagnosis not present

## 2022-02-09 LAB — CBC
HCT: 28.5 % — ABNORMAL LOW (ref 36.0–46.0)
Hemoglobin: 8.8 g/dL — ABNORMAL LOW (ref 12.0–15.0)
MCH: 27.9 pg (ref 26.0–34.0)
MCHC: 30.9 g/dL (ref 30.0–36.0)
MCV: 90.5 fL (ref 80.0–100.0)
Platelets: 383 10*3/uL (ref 150–400)
RBC: 3.15 MIL/uL — ABNORMAL LOW (ref 3.87–5.11)
RDW: 16.2 % — ABNORMAL HIGH (ref 11.5–15.5)
WBC: 12.3 10*3/uL — ABNORMAL HIGH (ref 4.0–10.5)
nRBC: 0 % (ref 0.0–0.2)

## 2022-02-09 LAB — RENAL FUNCTION PANEL
Albumin: 1.8 g/dL — ABNORMAL LOW (ref 3.5–5.0)
Anion gap: 16 — ABNORMAL HIGH (ref 5–15)
BUN: 60 mg/dL — ABNORMAL HIGH (ref 6–20)
CO2: 26 mmol/L (ref 22–32)
Calcium: 9.7 mg/dL (ref 8.9–10.3)
Chloride: 92 mmol/L — ABNORMAL LOW (ref 98–111)
Creatinine, Ser: 10.92 mg/dL — ABNORMAL HIGH (ref 0.44–1.00)
GFR, Estimated: 4 mL/min — ABNORMAL LOW (ref >60)
Glucose, Bld: 96 mg/dL (ref 70–99)
Phosphorus: 10.6 mg/dL — ABNORMAL HIGH (ref 2.5–4.6)
Potassium: 4.3 mmol/L (ref 3.5–5.1)
Sodium: 134 mmol/L — ABNORMAL LOW (ref 135–145)

## 2022-02-09 LAB — C-REACTIVE PROTEIN: CRP: 36 mg/dL — ABNORMAL HIGH (ref <1.0)

## 2022-02-09 MED ORDER — DICLOFENAC SODIUM 1 % EX GEL
4.0000 g | Freq: Four times a day (QID) | CUTANEOUS | Status: DC | PRN
  Administered 2022-02-09 – 2022-02-16 (×3): 4 g via TOPICAL
  Filled 2022-02-09: qty 100

## 2022-02-09 MED ORDER — SENNOSIDES-DOCUSATE SODIUM 8.6-50 MG PO TABS
2.0000 | ORAL_TABLET | Freq: Two times a day (BID) | ORAL | Status: DC
  Administered 2022-02-10 – 2022-02-16 (×8): 2 via ORAL
  Filled 2022-02-09 (×12): qty 2

## 2022-02-09 MED ORDER — POLYETHYLENE GLYCOL 3350 17 G PO PACK
17.0000 g | PACK | Freq: Two times a day (BID) | ORAL | Status: DC
  Administered 2022-02-10 – 2022-02-16 (×4): 17 g via ORAL
  Filled 2022-02-09 (×10): qty 1

## 2022-02-09 MED ORDER — CEFAZOLIN SODIUM-DEXTROSE 2-4 GM/100ML-% IV SOLN
2.0000 g | Freq: Once | INTRAVENOUS | Status: AC
  Administered 2022-02-09: 2 g via INTRAVENOUS
  Filled 2022-02-09: qty 100

## 2022-02-09 MED ORDER — CEFAZOLIN SODIUM-DEXTROSE 2-4 GM/100ML-% IV SOLN
2.0000 g | INTRAVENOUS | Status: DC
  Administered 2022-02-10 – 2022-02-12 (×2): 2 g via INTRAVENOUS
  Filled 2022-02-09 (×3): qty 100

## 2022-02-09 NOTE — Progress Notes (Signed)
?   02/09/22 1445  ?Clinical Encounter Type  ?Visited With Patient;Health care provider  ?Visit Type Initial  ?Referral From Nurse;Care management  ?Consult/Referral To None  ? ?Chaplain responded to a spiritual consult requesting prayer. Patient was resting and did not wish to talk or prayer at this time. Speaking with therapist I will request a sitter for patient. The presence of someone with her for a while may be the reassurance that [place] needs.  ? ?Danice Goltz  ?Chaplain Resident ?[facility]  ?[phone] ?

## 2022-02-09 NOTE — Progress Notes (Signed)
? ?Subjective:  ? ?NAEON ?  ?Discussed pain management plan going forward. Still requesting IV pain medications.  ? ?Refuses to move wrist and knee. States that her tolerance to pain is high. She is adamant about wanting IV pain medications, but discussed that we need to try to manage with oral medications as it has been about 10 days post-op and we need to move away from prolonged IV antibiotics. She then falls asleep at the end of the assessment. ? ? ?Objective: ? ?Vital signs in last 24 hours: ?Vitals:  ? 02/08/22 0950 02/08/22 1659 02/08/22 2008 02/09/22 0433  ?BP: 132/76 136/72 128/71 (!) 150/81  ?Pulse: 100 93 90 95  ?Resp: 18 17 18 18   ?Temp: 98 ?F (36.7 ?C) 98.5 ?F (36.9 ?C) 99 ?F (37.2 ?C) 99.1 ?F (37.3 ?C)  ?TempSrc: Oral Oral Oral Oral  ?SpO2: 98% 98% 95% 92%  ?Weight:      ?Height:      ? ?Physical Exam ?General: young female, lying in bed, NAD but becomes intermittently tearful with light touch of the fingers of her L hand. ?Pulm: normal work of breathing on RA. ?MSK: Able to move LLE and RUE without difficulty and against gravity. RLE and LUE remained largely motionless during encounter, limited by pain; was able to straighten fingers of left hand.  No warmth of right lower extremity and knee, mildly TTP.  No warmth of left wrist and hand, no TTP elicited when checking temperature, but patient cries in excruciating pain when straightening fingers.  Straightens fingers herself when frustrated with our team, and does not cry when doing so. ?Neuro: alert and oriented, no focal deficits noted. ?Psych: irritable mood and agitated affect ? ?Assessment/Plan: ? ?Principal Problem: ?  Septic arthritis of multiple joints ([place]) ?Active Problems: ?  End stage renal disease ([place]) ?  Knee pain, acute ?  Hyperphosphatemia ?  Effusion of right knee ?  Hyperkalemia ?  Arthritis, septic ([place]) ?  Dialysis patient, noncompliant ([place]) ?  Left wrist pain ?  Group G streptococcal infection ?  Pain management ? ?Natasha Long is  a 26 year old female with a history of nephrotic syndrome on hemodialysis who has been noncompliant with sessions for the past 2.5 weeks, borderline personality disorder, and recent homelessness who presented with traumatic knee injury after falling down a flight of stairs, found to have polyarticular septic arthritis. ? ?Acute traumatic knee injury ?Multifocal septic arthritis ?Patient afebrile, overnight. CBC showing mildly elevated leukocytosis. Postop day 10 s/p I&D of right knee and left wrist by ortho, appreciate assistance. Cultures showing group G strep. Patient on cefazolin x6 weeks per ID, appreciate assistance. She is WBAT per ortho, but has not worked with PT/OT and they have signed off due to repeated refusals. Biggest issue during stay has been pain control. Repeated discussions about pain medication being in place to make her more functional have not been successful. Has been transitioned off of PCA dilaudid pump IV dilaudid. Pain control will be primarily with oral medications and Voltaren gel moving forward. Re-consulted PT/OT for mobilization. ?-IV cefazolin with HD x6 weeks (end date approximately 4/15) ?-tylenol q8 scheduled ?-PO dilaudid 4mg  q4h ?-PO Gabapentin 300 mg qHS ?-Voltaren gel 4g QID PRN ?-trend CBC qd, CRP q48h ?-consulted PT/OT again; awaiting recs ? ?ESRD with HD noncompliance ?Hyperphosphatemia 2/2 missed HD ?Hyperkalemia 2/2 missed HD, resolved  ?Patient reports history of nephrotic syndrome.  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  Patient reports noncompliance with hemodialysis due to being unhomed. Multiple presentations  at multiple ED's after missing HD. ?-- appreciate nephrology assistance ?- plan to transition patient back to TTS schedule tomorrow ? ?Anemia of ESRD ?Acute blood loss anemia ?Status post 3 units PRBCs.  She remains asymptomatic from this. Hemoglobin has remained stable. ?-Aranesp 100 mg with HD per nephro ?-Trend CBC, transfuse if hemoglobin less than 7 ?  ?History of  borderline personality disorder ?Major depressive disorder ?Generalized anxiety disorder ?Follows at [facility] for care.  Home medications: Abilify 10 mg (patient-reported dose, 7.5 documented), gabapentin 300 mg 3 times daily, and hydroxyzine 25 mg 3 times daily as needed. ?- Continue home Abilify 7.5 mg and hydroxyzine 25 mg PRN anxiety ?- TOC consult for unhomed status; appreciate assistance ?  ?Hypertension ?Patient non-adherent with home medications. Improved with HD and home losartan.  ?-- Continue home Losartan 50 mg daily ?-- Monitor daily vitals ? ?Prior to Admission Living Arrangement: Unhomed, awaiting housing at [employer]. ?Anticipated Discharge Location: Pending ?Barriers to Discharge: Unhomed status, patient not participating with PT/OT  ?Dispo: To be decided.  ? ?[name], MD ?02/09/2022, 6:59 AM ?Pager: [phone] ?After 5pm on weekdays and 1pm on weekends: On Call pager [phone]  ?

## 2022-02-09 NOTE — TOC Progression Note (Signed)
Transition of Care (TOC) - Initial/Assessment Note  ? ? ?Patient Details  ?Name: Natasha Long ?MRN: [id number] ?Date of Birth: 06/16/1996 ? ?Transition of Care (TOC) CM/SW Contact:    ?Paulene Floor Krystle Oberman, LCSWA ?Phone Number: ?02/09/2022, 12:05 PM ? ?Clinical Narrative:                 ?CSW reviewed patient's chart.  The patient does not have any bed offers.  Also, both PT and OT have signed off due to the patient refusing multiple therapy sessions.   ? ?TOC barriers to SNF discharge:  mental health, SA, refusing therapy, homelessness ? ?  ?Barriers to Discharge: Continued Medical Work up, Homeless with medical needs ? ? ?Patient Goals and CMS Choice ?  ?  ?  ? ?Expected Discharge Plan and Services ?  ?  ?  ?  ?Living arrangements for the past 2 months: Homeless ?                ?  ?  ?  ?  ?  ?  ?  ?  ?  ?  ? ?Prior Living Arrangements/Services ?Living arrangements for the past 2 months: Homeless ?Lives with:: Other (Comment) ?Patient language and need for interpreter reviewed:: Yes ?       ?Need for Family Participation in Patient Care: Yes (Comment) ?Care giver support system in place?: No (comment) ?  ?Criminal Activity/Legal Involvement Pertinent to Current Situation/Hospitalization: No - Comment as needed ? ?Activities of Daily Living ?Home Assistive Devices/Equipment: None ?ADL Screening (condition at time of admission) ?Patient's cognitive ability adequate to safely complete daily activities?: Yes ?Is the patient deaf or have difficulty hearing?: No ?Does the patient have difficulty seeing, even when wearing glasses/contacts?: No ?Does the patient have difficulty concentrating, remembering, or making decisions?: No ?Patient able to express need for assistance with ADLs?: Yes ?Does the patient have difficulty dressing or bathing?: No ?Independently performs ADLs?: Yes (appropriate for developmental age) ?Does the patient have difficulty walking or climbing stairs?: No ?Weakness of Legs: Right ?Weakness of  Arms/Hands: None ? ?Permission Sought/Granted ?  ?Permission granted to share information with : Yes, Verbal Permission Granted ?   ? Permission granted to share info w AGENCY: Facilities if physical therapy recommends ?   ?   ? ?Emotional Assessment ?Appearance:: Appears older than stated age ?Attitude/Demeanor/Rapport: Engaged ?Affect (typically observed): Appropriate ?Orientation: : Oriented to Situation, Oriented to  Time, Oriented to Place, Oriented to Self ?Alcohol / Substance Use: Illicit Drugs ?Psych Involvement: No (comment) ? ?Admission diagnosis:  End stage renal disease ([place]) [N18.6] ?Dialysis patient, noncompliant ([place]) [Z91.15] ?Effusion of right knee [M25.461] ?Knee pain, acute [M25.569] ?Osteoarthritis of right knee, unspecified osteoarthritis type [M17.11] ?Patient Active Problem List  ? Diagnosis Date Noted  ? Pain management   ? Septic arthritis of multiple joints ([place]) 02/01/2022  ? Group G streptococcal infection 02/01/2022  ? Left wrist pain   ? Hyperkalemia 01/30/2022  ? Effusion of right knee   ? Arthritis, septic ([place])   ? Dialysis patient, noncompliant ([place])   ? Knee pain, acute 01/29/2022  ? Hypokalemia 01/29/2022  ? Hyperphosphatemia 01/29/2022  ? Chest pain   ? Hypervolemia associated with renal insufficiency 12/23/2021  ? Elevated troponin 12/23/2021  ? Tobacco use 08/11/2021  ? Nipple discharge 08/11/2021  ? Breast pain 08/11/2021  ? Exposure to sexually transmitted disease (STD) 08/11/2021  ? Complication of vascular dialysis catheter 02/12/2021  ? Moderate protein-calorie malnutrition ([place]) 01/27/2021  ? Allergy, unspecified,  initial encounter 01/23/2021  ? Anemia in chronic kidney disease 01/23/2021  ? End stage renal disease ([place]) 01/23/2021  ? Iron deficiency anemia, unspecified 01/23/2021  ? Other specified coagulation defects ([place]) 01/23/2021  ? Secondary hyperparathyroidism of renal origin ([place]) 01/23/2021  ? Anemia   ? Suicidal ideations   ? Influenza vaccine refused  12/19/2020  ? Morbid obesity ([place]) 01/30/2020  ? 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 Cocaine use, 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[place]) 01/30/2020  ? Affective psychosis, bipolar ([place]) 06/27/2019  ? Borderline personality disorder ([place]) 11/16/2018  ? Moderate cannabis use disorder ([place]) 11/16/2018  ? Severe recurrent major depression without psychotic features ([place]) 11/15/2018  ? Chronic hypertension 04/04/2018  ? PTSD (post-traumatic stress disorder) 04/01/2018  ? ?PCP:  [name], MD ?Pharmacy:   ?[facility] [place], [place] - [address] ?[address] ?[place] [address] ?Phone: [phone] Fax: [phone] ? ?[facility] ?[address] ?[place] [address] ?Phone: [phone] Fax: [phone] ? ? ? ? ?Social Determinants of Health (SDOH) Interventions ?  ? ?Readmission Risk Interventions ?No flowsheet data found. ? ? ?

## 2022-02-09 NOTE — Progress Notes (Signed)
Physical Therapy Treatment ?Patient Details ?Name: Natasha Long ?MRN: [id number] ?DOB: [birth year] ?Today's Date: 02/09/2022 ? ? ?History of Present Illness 26 y.o. female presented to ED 01/28/22 for pain in right knee which occurred after she twisted it when she stumbled, after twisting her left ankle.  Right knee x-ray, CT right knee-degenerative joint present, knee effusion present, no fracture present. Needs dialysis after not presenting for dialysis x 2 weeks. 01/30/22 Rt knee aspiration and injection; 01/30/22 due to septic joint underwent Rt knee extensive synovectomy and later meniscal debridement; also developed left wrist pain and had arthrotomy for irrigation due to infection;    PMH nephrotic syndrome now on HD TThS, HTN, diastolic heart failure, borderline personality disorder, generalized anxiety, major depressive disorder, and recent homelessness ? ?  ?PT Comments  ? ? Reorder received after discussing Natasha Long's case with Nephrology NP on 3/12; Continuing work on functional mobility and activity tolerance;  Session focused on providing gentle presence and collaborate with Natasha Long to keep her body, RLE/knee, and L UE moving; She agreed to get up to EOB to allow for re-wrapping R knee with Ace wrap; Pt used a belt looped around her foot to move her RLE to EOB, and needed supportive assist to ease her R foot to the floor; Pt briefly cried in pain at times, but continued to participate; at EOB, initiated seated knee flex/extend AAROM with belt, minimal muscle recruitment about knee for movement, but there is also simple progress in the fact that she allowed for palpation to hamstring and quad tendons about her R knee;  ? ?Discussed using the platform RW, putting egg crate foam back on platform; Pt politely requested to lay back down, asking to work on standing tomorrow;  ? ?Discussed with pt's nurse -- likley she will receive pain meds at or close to 9:30 am tomorrow, and that is likely before HD (if she  is able to go on round 2); Also discussed pt status with Chaplain, and appreciate all efforts in supporting Natasha Long.  ?Recommendations for follow up therapy are one component of a multi-disciplinary discharge planning process, led by the attending physician.  Recommendations may be updated based on patient status, additional functional criteria and insurance authorization. ? ?Follow Up Recommendations ? Skilled nursing-short term rehab (<3 hours/day) ?  ?  ?Assistance Recommended at Discharge Frequent or constant Supervision/Assistance  ?Patient can return home with the following Assistance with cooking/housework;Assist for transportation;A lot of help with walking and/or transfers;A lot of help with bathing/dressing/bathroom;Help with stairs or ramp for entrance ?  ?Equipment Recommendations ? Other (comment) (Unable to assess)  ?  ?Recommendations for Other Services Other (comment) (Will consider requesting OT consult) ? ? ?  ?Precautions / Restrictions Precautions ?Precautions: Fall ?Precaution Comments: L wrist splint for comfort; L wrist/digit ROM ok ?Restrictions ?RLE Weight Bearing: Weight bearing as tolerated ?Other Position/Activity Restrictions: Encourage R knee ROM as tolerated  ?  ? ?Mobility ? Bed Mobility ?Overal bed mobility: Needs Assistance ?Bed Mobility: Supine to Sit ?  ?  ?Supine to sit: HOB elevated, Mod assist ?  ?  ?General bed mobility comments: Pt agreed to get up to EOB on L side of bed; Looped belt around pt's  R foot to give her control over motion of RLE; pt requested the [facility] be elevated to near highest position to allow for her to lean against HOB while Using belt to move R foot colser to EOB; Once foot cleared, assist to supopr tfoot coming to floor, and use  of bed pad to help R hip come forward and assist in squaring off hips at EOB ?  ? ?Transfers ?  ?  ?  ?  ?  ?  ?  ?  ?  ?General transfer comment: We discusssed th epossibility of standing; Pt requested we work on standing next  session ?  ? ?Ambulation/Gait ?  ?  ?  ?  ?  ?  ?  ?General Gait Details: Plan fo rnext session ? ? ?Stairs ?  ?  ?  ?  ?  ? ? ?Wheelchair Mobility ?  ? ?Modified Rankin (Stroke Patients Only) ?  ? ? ?  ?Balance   ?  ?Sitting balance-Leahy Scale: Good ?  ?  ?  ?  ?  ?  ?  ?  ?  ?  ?  ?  ?  ?  ?  ?  ?  ? ?  ?Cognition Arousal/Alertness: Awake/alert ?Behavior During Therapy: [facility] for tasks assessed/performed ([place]) ?Overall Cognitive Status: No family/caregiver present to determine baseline cognitive functioning ?Area of Impairment: Awareness, Safety/judgement ?  ?  ?  ?  ?  ?  ?  ?  ?  ?  ?  ?  ?Safety/Judgement: Decreased awareness of safety ?Awareness: Intellectual ?  ?General Comments: Anxious with anticipateion of pain; Overall polite theis session ?  ?  ? ?  ?Exercises Other Exercises ?Other Exercises: Knee flexion/extension AAROM with foot supported on the floor on a washcloth to help decr friction; 10 reps with very small range; hamstring contraction present, but minimal ?Other Exercises: 3 reps of L elbow flexion (bicep curl-like) ? ?  ?General Comments General comments (skin integrity, edema, etc.): Wrapped R knee with ACE wrap once sitting EOB ?  ?  ? ?Pertinent Vitals/Pain Pain Assessment ?Pain Assessment: Faces ?Faces Pain Scale: Hurts worst ?Pain Location: R knee as her foot cleared EOB ?Pain Descriptors / Indicators: Grimacing, Guarding, Crying ?Pain Intervention(s): Premedicated before session, Repositioned  ? ? ?Home Living   ?  ?  ?  ?  ?  ?  ?  ?  ?  ?   ?  ?Prior Function    ?  ?  ?   ? ?PT Goals (current goals can now be found in the care plan section) Acute Rehab PT Goals ?Patient Stated Goal: Agreed to get EOB for some moving, and to wrap knee; Asked if we can work on standing nest session ?PT Goal Formulation: With patient ?Time For Goal Achievement: 02/15/22 ?Potential to Achieve Goals: Fair ?Progress towards PT goals: Progressing toward goals (slowly progressing towards original goals) ? ?   ?Frequency ? ? ? Min 4X/week ? ? ? ?  ?PT Plan Frequency needs to be updated  ? ? ?Co-evaluation   ?  ?  ?  ?  ? ?  ?AM-PAC PT "6 Clicks" Mobility   ?Outcome Measure ? Help needed turning from your back to your side while in a flat bed without using bedrails?: A Little ?Help needed moving from lying on your back to sitting on the side of a flat bed without using bedrails?: A Lot ?Help needed moving to and from a bed to a chair (including a wheelchair)?: A Lot ?Help needed standing up from a chair using your arms (e.g., wheelchair or bedside chair)?: A Lot ?Help needed to walk in hospital room?: Total ?Help needed climbing 3-5 steps with a railing? : Total ?6 Click Score: 11 ? ?  ?End of Session Equipment  Utilized During Treatment: Gait belt (for therex) ?Activity Tolerance: Patient limited by pain (but participating and without outbursts) ?Patient left: in bed;with call bell/phone within reach ?Nurse Communication: Mobility status ?PT Visit Diagnosis: Other abnormalities of gait and mobility (R26.89);Pain ?Pain - Right/Left:  (R knee, L wrist and hand) ?Pain - part of body:  (R knee, L wrist and hand) ?  ? ? ?Time: 4114-6431 ?PT Time Calculation (min) (ACUTE ONLY): 29 min ? ?Charges:  $Therapeutic Exercise: 8-22 mins ?$Therapeutic Activity: 8-22 mins          ?          ? ?Roney Marion, PT  ?Acute Rehabilitation Services ?Pager [phone] ?Office [phone] ? ? ? ?[name] ?02/09/2022, 4:18 PM ? ?

## 2022-02-09 NOTE — Progress Notes (Addendum)
Subjective: Some complaints of knee discomfort but not in distress, next dialysis on schedule Tuesday tomorrow ? ?Objective ?Vital signs in last 24 hours: ?Vitals:  ? 02/08/22 1659 02/08/22 2008 02/09/22 0433 02/09/22 0849  ?BP: 136/72 128/71 (!) 150/81 137/77  ?Pulse: 93 90 95 96  ?Resp: 17 18 18 17   ?Temp: 98.5 ?F (36.9 ?C) 99 ?F (37.2 ?C) 99.1 ?F (37.3 ?C) 98.2 ?F (36.8 ?C)  ?TempSrc: Oral Oral Oral   ?SpO2: 98% 95% 92% 98%  ?Weight:      ?Height:      ? ?Weight change:  ? ?Physical Exam: ?General: Obese young female in NAD ?Heart: RRR no MRG ?Lungs: CTA, nonlabored breathing ?Abdomen: Obese NABS, NTND ?Extremities: Right knee dressing dry clean with right lower extremity trace edema, none on left, left hand dressing dry intact ?Dialysis Access: Left arm AV fistula positive bruit ? ?  ? Op Dialysis Orders: ?[facility], TTS, 3.5 hs,BFR 350 / Auto flow DFR ?2K, 2.5 CA EDW 110kg ?Heparin 5000 units ?Mircera 100 mcg last given 11/20/01 ?Calcitriol 1.75 mcg p.o. q. HD  ?Venofer 50 MG q. weekly  ?LUA AVF ?  ?Problem/Plan ?Altered mental status with severe uremia/right knee infection (admit BUN-158 cr 30) 2/2 missed OP HD ,last op HD 01/08/2022.  Has received some serial Hds in hospital- mental status improved.  ?ESRD -T,Th,S. Has been off schedule D/T staffing and census issues. Next HD 02/10/2022.  On schedule ?Hypertension/volume-BP variable. Resetting EDW. UF as tolerated.  ?Right knee pain //infection /status post knee irrigation and debridement 3/04. R knee cx (+) group g streptococcus. WBC trending back up,  blood cx (-) X 5 days. Ortho and ID following-continue Cefazolin, noted some patient noncompliance with PT ? Left wrist infection status post I&D by hand surgeon 3/03. Reviewed cultures: (+) Group G streptococcus ?Anemia  -a.m. Hgb 8.8 s/p 3 units PRBCs this admit.Aranesp 100 mcg  given 3/4. Avoid iron with infection picture ?Metabolic bone disease -Corrected Ca+ > 10.  PO4 10.6  Switched to Velphoro binders noted  has refused some in hospital, 2.0 Ca+ bath. Hold VDRA.  ?Nutrition -albumin 1.8 protein supplements , Renal diet. Fluid restrictions.  ?History of borderline personality disorder, major depressive order, generalized anxiety/last reported homeless- plan per admit team ?Dispo-needing SNF, patient expresses the reasoning for frequent missed HD treatments was d/t transportation issues. She reports currently not having a place to live. ? ?Ernest Haber, PA-C ?[facility] ?Beeper [phone] ?02/09/2022,10:00 AM ? LOS: 11 days  ? ?Labs: ?Basic Metabolic Panel: ?Recent Labs  ?Lab 02/07/22 ?1024 02/08/22 ?0116 02/09/22 ?0707  ?NA 136 134* 134*  ?K 4.3 3.9 4.3  ?CL 95* 93* 92*  ?CO2 23 27 26   ?GLUCOSE 104* 98 96  ?BUN 48* 34* 60*  ?CREATININE 9.50* 7.25* 10.92*  ?CALCIUM 9.7 9.3 9.7  ?PHOS 9.3* 7.9* 10.6*  ? ?Liver Function Tests: ?Recent Labs  ?Lab 02/07/22 ?1024 02/08/22 ?0116 02/09/22 ?0707  ?ALBUMIN 1.8* 1.7* 1.8*  ? ?No results for input(s): LIPASE, AMYLASE in the last 168 hours. ?No results for input(s): AMMONIA in the last 168 hours. ?CBC: ?Recent Labs  ?Lab 02/02/22 ?1623 02/03/22 ?0429 02/05/22 ?3664 02/06/22 ?4034 02/07/22 ?1024 02/08/22 ?0116 02/09/22 ?0707  ?WBC 9.9   < > 9.8 9.2 10.9* 10.7* 12.3*  ?NEUTROABS 8.0*  --   --   --   --   --   --   ?HGB 9.0*   < > 8.8* 8.9* 8.9* 9.0* 8.8*  ?HCT 26.1*   < >  27.1* 28.3* 28.6* 29.2* 28.5*  ?MCV 84.5   < > 88.3 90.1 88.8 89.8 90.5  ?PLT 206   < > 274 324 358 323 383  ? < > = values in this interval not displayed.  ? ?Cardiac Enzymes: ?No results for input(s): CKTOTAL, CKMB, CKMBINDEX, TROPONINI in the last 168 hours. ?CBG: ?No results for input(s): GLUCAP in the last 168 hours. ? ?Studies/Results: ?No results found. ?Medications: ? sodium chloride    ? sodium chloride    ? sodium chloride 10 mL/hr at 01/30/22 1700  ?  ceFAZolin (ANCEF) IV 2 g (02/06/22 2035)  ? ? acetaminophen  1,000 mg Oral Q8H  ? ARIPiprazole  7.5 mg Oral Daily  ? Chlorhexidine Gluconate  Cloth  6 each Topical Q0600  ? darbepoetin (ARANESP) injection - DIALYSIS  100 mcg Intravenous Q Sat-HD  ? heparin injection (subcutaneous)  5,000 Units Subcutaneous Q8H  ? HYDROmorphone  4 mg Oral Q4H  ? losartan  50 mg Oral Daily  ? multivitamin  1 tablet Oral Daily  ? pantoprazole  40 mg Oral Daily  ? senna-docusate  1 tablet Oral BID  ? sucroferric oxyhydroxide  1,000 mg Oral TID WC  ? ? ? ? ?

## 2022-02-09 NOTE — Progress Notes (Signed)
? ?  Subjective: ? ?Patient continues to have significant pain and is requesting IV pain medication.  She is not willing to move her right knee participate in physical exam today's visit.  Has refused physical therapy and CPM multiple times.  Inflammatory labs rising. ? ?Objective:  ? ?VITALS:   ?Vitals:  ? 02/08/22 0950 02/08/22 1659 02/08/22 2008 02/09/22 0433  ?BP: 132/76 136/72 128/71 (!) 150/81  ?Pulse: 100 93 90 95  ?Resp: 18 17 18 18   ?Temp: 98 ?F (36.7 ?C) 98.5 ?F (36.9 ?C) 99 ?F (37.2 ?C) 99.1 ?F (37.3 ?C)  ?TempSrc: Oral Oral Oral Oral  ?SpO2: 98% 98% 95% 92%  ?Weight:      ?Height:      ? ? ?Right leg with dressing in place.  Right knee is held at 30 degrees and she is unwilling to participate in a passive range of motion examination.  SILT all distributions right foot. 2+ DP pulse ? ?Lab Results  ?Component Value Date  ? WBC 12.3 (H) 02/09/2022  ? HGB 8.8 (L) 02/09/2022  ? HCT 28.5 (L) 02/09/2022  ? MCV 90.5 02/09/2022  ? PLT 383 02/09/2022  ? ? ? ?Assessment/Plan: ? ?10 Days Post-Op right knee irrigation and debridement.  At this time the patient has been noncompliant with and participation with range of motion about the knee.  Given the fact that she is not willing to move her knee without significant amounts of IV pain medication, I do not see what any additional value of a repeat irrigation and debridement would offer as we would likely remain in the cycle.  At this point it is virtually impossible to examine her knee as she is not willing to participate without significant doses of pain medication.  At this point I do not believe that 0 pain is a reasonable expectation and have expressed that there will be some component of pain that she needs to work through.  Unfortunately she has somewhat of an expectation that she will not have any pain.  Given her unwillingness to work with physical therapy and move the knee and tolerate even some low level of pain, I do not necessarily believe that any  additional intervention would benefit her in any way.  Would continue to recommend work-up of other infectious sources as her inflammatory labs have continued to go up despite irrigation and debridement of the wrist and knee.  Orthopedics willing to become reengaged should the patient be willing to comply with treatment protocol and further care. ? ? Vanetta Mulders ?02/09/2022, 8:14 AM ? ?

## 2022-02-09 NOTE — Progress Notes (Signed)
Pharmacy Antibiotic Note ? ?Natasha Long is a 26 y.o. female on day # 10 Cefazolin for septic arthritis.  ? ?  Cefazolin 2gm IV scheduled MWF after HD, which had been off schedule. Last Cefazolin dose 3/10 after HD.  HD done 3/11 for 2.5 hrs but Cefazolin dose was not given.  Next HD planned 3/14, back on TTS schedule. ? ?Plan: ?Cefazolin 2gm IV x 1 today, then continue TTS after HD beginning 3/14. ?Plan 6 weeks antibioic therapy per ID. ? ?Height: 5\' 8"  (172.7 cm) ?Weight: 97.5 kg (214 lb 15.2 oz) ?IBW/kg (Calculated) : 63.9 ? ?Temp (24hrs), Avg:98.7 ?F (37.1 ?C), Min:98.2 ?F (36.8 ?C), Max:99.1 ?F (37.3 ?C) ? ?Recent Labs  ?Lab 02/05/22 ?1859 02/06/22 ?0931 02/07/22 ?1024 02/08/22 ?0116 02/09/22 ?0707  ?WBC 9.8 9.2 10.9* 10.7* 12.3*  ?CREATININE 10.61* 12.87* 9.50* 7.25* 10.92*  ?  ?Estimated Creatinine Clearance: 9.4 mL/min (A) (by C-G formula based on SCr of 10.92 mg/dL (H)).   ? ?Allergies  ?Allergen Reactions  ? Prozac [Fluoxetine Hcl] Other (See Comments)  ?  Panic attack   ? Wellbutrin [Bupropion] Other (See Comments)  ?  Panic attack  ? Prednisone Other (See Comments)  ?  Pt states that this med caused pancreatitis.   ? ? ?Antimicrobials this admission: ?Vancomycin 3/3 >>3/5 ?Cefepime 3/3 >>3/3 ?Cefazolin 3/4>> ? ?Microbiology results: ?3/3 Blood: negative  ?3/3 R knee synovial fluid: group G Strep, R to clinda, erythro ?3/3 L wrist synovial fluid: group G Strep ?3/3 COVID and flu: negative ? ?Thank you for allowing pharmacy to be a part of this patient?s care. ? ?Arty Baumgartner, RPh ?02/09/2022 10:37 AM ? ?

## 2022-02-10 DIAGNOSIS — N186 End stage renal disease: Secondary | ICD-10-CM | POA: Diagnosis not present

## 2022-02-10 DIAGNOSIS — M0029 Other streptococcal polyarthritis: Secondary | ICD-10-CM | POA: Diagnosis not present

## 2022-02-10 DIAGNOSIS — Z9115 Patient's noncompliance with renal dialysis: Secondary | ICD-10-CM | POA: Diagnosis not present

## 2022-02-10 DIAGNOSIS — Z992 Dependence on renal dialysis: Secondary | ICD-10-CM | POA: Diagnosis not present

## 2022-02-10 LAB — RENAL FUNCTION PANEL
Albumin: 1.7 g/dL — ABNORMAL LOW (ref 3.5–5.0)
Anion gap: 19 — ABNORMAL HIGH (ref 5–15)
BUN: 77 mg/dL — ABNORMAL HIGH (ref 6–20)
CO2: 22 mmol/L (ref 22–32)
Calcium: 9.5 mg/dL (ref 8.9–10.3)
Chloride: 92 mmol/L — ABNORMAL LOW (ref 98–111)
Creatinine, Ser: 13.13 mg/dL — ABNORMAL HIGH (ref 0.44–1.00)
GFR, Estimated: 4 mL/min — ABNORMAL LOW (ref >60)
Glucose, Bld: 104 mg/dL — ABNORMAL HIGH (ref 70–99)
Phosphorus: >30 mg/dL — ABNORMAL HIGH (ref 2.5–4.6)
Potassium: 4.2 mmol/L (ref 3.5–5.1)
Sodium: 133 mmol/L — ABNORMAL LOW (ref 135–145)

## 2022-02-10 MED ORDER — ALTEPLASE 2 MG IJ SOLR: 2.0000 mg | Freq: Once | INTRAMUSCULAR | Status: DC | PRN

## 2022-02-10 MED ORDER — HEPARIN SODIUM (PORCINE) 1000 UNIT/ML DIALYSIS: 2500.0000 [IU] | Freq: Once | INTRAMUSCULAR | Status: DC

## 2022-02-10 NOTE — Progress Notes (Signed)
Physical Therapy Treatment ?Patient Details ?Name: Natasha Long ?MRN: [id number] ?DOB: [birth year] ?Today's Date: 02/10/2022 ? ? ?History of Present Illness 26 y.o. female presented to ED 01/28/22 for pain in right knee which occurred after she twisted it when she stumbled, after twisting her left ankle.  Right knee x-ray, CT right knee-degenerative joint present, knee effusion present, no fracture present. Needs dialysis after not presenting for dialysis x 2 weeks. 01/30/22 Rt knee aspiration and injection; 01/30/22 due to septic joint underwent Rt knee extensive synovectomy and later meniscal debridement; also developed left wrist pain and had arthrotomy for irrigation due to infection;    PMH nephrotic syndrome now on HD TThS, HTN, diastolic heart failure, borderline personality disorder, generalized anxiety, major depressive disorder, and recent homelessness ? ?  ?PT Comments  ? ? Continuing efforts to work on functional mobility and activity tolerance;    ?Upon entrance, Natasha Long stated she was drained, and unable to "do anything" for PT session today;  ?This PT mirrored the comment back to her, while quietly setting up, and then took a  low position near her head to the left of the bed, facing the foot of the bed in a body position effort to be on her side;  ?Asked about moving her L elbow and shoulder, and Natasha Long repeated she "can't do anything"; ?Natasha Long then hit the call bell and asked for pain meds, [name], RN joined [place];  ?Pt was clearly making an effort to keep from yelling/cursing -- "I've asked nicely, can we not do anything?"; this PT agreed that we would not move/exercise today per her request -- did inform her that I would stay in the room to setup for tomorrow, and Natasha Long, [place] would join me to setup her L platform RW, which we did as quietly as able;  ?Asked Natasha Long if Voltaren gel would help with pain, and if she could show Natasha Long, PT Student, how to apply it to her knee; pt declined;   ?Asked pt what her plan will be for PT tomorrow, and she indicated we will work on standing tomorrow ("whatever we were going to do today"); ? ?It is worth highlighting that [name] made an honest effort at self-control this afternoon; this PT has become used to cuss-word laden outbursts when she doesn't want to move, and today's declination of moving was far less loud -- less a demand, and more a request; Will continue efforts at ROM therex and functional mobility training;  ? ?The SW who works with Natasha Long at [facility] contacted me, and inquired about the possibility of her being able to visit [name] -- I think this is a fantastic idea; to have someone she is familiar with visit and encourage her will do [name] some good. ? ?Will contact HD to ask about her consistently being taken to HD on second round to allow for PT prior to HD; Highly unlikely she will be able to participate with PT after HD; Worth considering getting an order or HD in the HD recliner  ?Recommendations for follow up therapy are one component of a multi-disciplinary discharge planning process, led by the attending physician.  Recommendations may be updated based on patient status, additional functional criteria and insurance authorization. ? ?Follow Up Recommendations ? Skilled nursing-short term rehab (<3 hours/day) ?  ?  ?Assistance Recommended at Discharge Frequent or constant Supervision/Assistance  ?Patient can return home with the following Assistance with cooking/housework;Assist for transportation;A lot of help with walking and/or transfers;A lot of help with bathing/dressing/bathroom;Help  with stairs or ramp for entrance ?  ?Equipment Recommendations ? Other (comment) (Will start with platfrom RW)  ?  ?Recommendations for Other Services Other (comment) (Will consider requesting OT consult) ? ? ?  ?Precautions / Restrictions Precautions ?Precautions: Fall ?Precaution Comments: L wrist splint for comfort; L wrist/digit ROM  ok ?Restrictions ?RLE Weight Bearing: Weight bearing as tolerated ?Other Position/Activity Restrictions: Encourage R knee ROM as tolerated  ?  ? ?Mobility ? Bed Mobility ?  ?  ?  ?  ?  ?  ?  ?General bed mobility comments: Declined ?  ? ?Transfers ?  ?  ?  ?  ?  ?  ?  ?  ?  ?General transfer comment: Declined ?  ? ?Ambulation/Gait ?  ?  ?  ?  ?  ?  ?  ?General Gait Details: Declined ? ? ?Stairs ?  ?  ?  ?  ?  ? ? ?Wheelchair Mobility ?  ? ?Modified Rankin (Stroke Patients Only) ?  ? ? ?  ?Balance   ?  ?  ?  ?  ?  ?  ?  ?  ?  ?  ?  ?  ?  ?  ?  ?  ?  ?  ?  ? ?  ?Cognition Arousal/Alertness: Awake/alert (but drowsy; able to interact; eyes closed) ?Behavior During Therapy: [facility] for tasks assessed/performed (variable) ?Overall Cognitive Status: No family/caregiver present to determine baseline cognitive functioning ?  ?  ?  ?  ?  ?  ?  ?  ?  ?  ?  ?  ?  ?  ?  ?  ?General Comments: Able to express her wishes for PT to leave without raising her voice or cussing ?  ?  ? ?  ?Exercises   ? ?  ?General Comments  ?  ?  ? ?Pertinent Vitals/Pain Pain Assessment ?Pain Assessment: Faces ?Faces Pain Scale: Hurts a little bit ?Pain Descriptors / Indicators: Grimacing, Crying (But able to fall asleep within 5 minutes of leaving the room per RN) ?Pain Intervention(s): Premedicated before session  ? ? ?Home Living   ?  ?  ?  ?  ?  ?  ?  ?  ?  ?   ?  ?Prior Function    ?  ?  ?   ? ?PT Goals (current goals can now be found in the care plan section) Acute Rehab PT Goals ?Patient Stated Goal: PT: "I'll come back. Today? or tomorrow?"; Natasha Long:"Tomorrow" ?PT Goal Formulation: With patient ?Time For Goal Achievement: 02/15/22 ?Potential to Achieve Goals: Fair ?Progress towards PT goals: Not progressing toward goals - comment (but, anticipated decr ability to participate post HD) ? ?  ?Frequency ? ? ? Min 2X/week ? ? ? ?  ?PT Plan Frequency needs to be updated  ? ? ?Co-evaluation   ?  ?  ?  ?  ? ?  ?AM-PAC PT "6 Clicks" Mobility   ?Outcome  Measure ? Help needed turning from your back to your side while in a flat bed without using bedrails?: A Little ?Help needed moving from lying on your back to sitting on the side of a flat bed without using bedrails?: A Lot ?Help needed moving to and from a bed to a chair (including a wheelchair)?: A Lot ?Help needed standing up from a chair using your arms (e.g., wheelchair or bedside chair)?: A Lot ?Help needed to walk in hospital room?: Total ?Help needed climbing 3-5 steps with  a railing? : Total ?6 Click Score: 11 ? ?  ?End of Session   ?Activity Tolerance: Patient limited by pain;Patient limited by fatigue ?Patient left: in bed;with call bell/phone within reach ?Nurse Communication: Mobility status;Other (comment) (We discussed options to breakthrough and be able to help pt) ?PT Visit Diagnosis: Other abnormalities of gait and mobility (R26.89);Pain ?Pain - Right/Left:  (R knee, L wrist and hand) ?Pain - part of body:  (R knee, L wrist and hand) ?  ? ? ?Time: 2072-1828 ?PT Time Calculation (min) (ACUTE ONLY): 9 min ? ?Charges:  $Self Care/Home Management: 8-22          ?          ? ?Roney Marion, PT  ?Acute Rehabilitation Services ?Pager [phone] ?Office [phone] ? ? ? ?[name] ?02/10/2022, 3:41 PM ? ?

## 2022-02-10 NOTE — Progress Notes (Signed)
?   02/10/22 1500  ?Clinical Encounter Type  ?Visited With Health care provider;Patient not available  ?Visit Type Follow-up  ?Consult/Referral To None  ? ?Visited with care team patient was resting.  ? ?Danice Goltz ?Chaplain Resident  ?[facility] ?[phone]   ?

## 2022-02-10 NOTE — Progress Notes (Signed)
? ?Subjective: NAEON; patient participated with PT. ?  ?Patient seen in HD today, largely somnolent on exam, but awaking briefly to answer questions.  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 ? ?No other concerns or complaints at this time ? ? ?Objective: ? ?Vital signs in last 24 hours: ?Vitals:  ? 02/09/22 0849 02/09/22 1700 02/09/22 2118 02/10/22 0627  ?BP: 137/77 131/79 (!) 143/74 134/69  ?Pulse: 96 87 99 86  ?Resp: 17 18 18 18   ?Temp: 98.2 ?F (36.8 ?C) 98.3 ?F (36.8 ?C) 99.3 ?F (37.4 ?C) 98.2 ?F (36.8 ?C)  ?TempSrc:   Oral Oral  ?SpO2: 98% 98% 97% 98%  ?Weight:      ?Height:      ? ?Physical Exam ?General: young female, lying in bed, NAD, somnolent ?Pulm: normal work of breathing on RA. ?MSK: RLE and LUE remained largely motionless during encounter, limited by pain.  No warmth of right lower extremity (knee under bandage wrap) nor left hand (wrist in brace, and patient does not allow palpation).  No TTP elicited when checking temperature.  Patient uncooperative with ROM assessment. ?Neuro: Somnolent, no focal deficits noted. ?Psych: Normal mood and affect ? ?Assessment/Plan: ? ?Principal Problem: ?  Septic arthritis of multiple joints ([place]) ?Active Problems: ?  End stage renal disease ([place]) ?  Knee pain, acute ?  Hyperphosphatemia ?  Effusion of right knee ?  Hyperkalemia ?  Arthritis, septic ([place]) ?  Dialysis patient, noncompliant ([place]) ?  Left wrist pain ?  Group G streptococcal infection ?  Pain management ? ?[place] is a 26 year old female with a history of nephrotic syndrome on hemodialysis who has been noncompliant with sessions for the past 2.5 weeks, borderline personality disorder, and recent homelessness who presented with traumatic knee injury after falling down a flight of stairs, found to have polyarticular septic arthritis. ? ?Acute traumatic knee  injury ?Multifocal septic arthritis ?Patient afebrile overnight. CBC showing mildly elevated leukocytosis. Postop day 11 s/p I&D of right knee and left wrist by ortho, appreciate assistance. Cultures showing group G strep. Patient on cefazolin x6 weeks per ID, appreciate assistance. She is WBAT per ortho, but has not worked with PT/OT and they have signed off due to repeated refusals. Biggest issue during stay has been pain control. Repeated discussions about pain medication being in place to make her more functional have not been successful. Has been transitioned off of PCA dilaudid pump IV dilaudid. Pain control will be primarily with oral medications and Voltaren gel moving forward.  ?-IV cefazolin with HD x6 weeks (end date approximately 4/15) ?-tylenol q8 scheduled ?-PO dilaudid 4mg  q4h ?-PO Gabapentin 300 mg qHS ?-Voltaren gel 4g QID PRN ?-trend CBC qd, CRP q48h ?-seen by PT/OT again; recommend SNF ? ?ESRD with HD noncompliance ?Hyperphosphatemia 2/2 missed HD ?Hyperkalemia 2/2 missed HD, resolved  ?Patient reports history of nephrotic syndrome.  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  Patient reports noncompliance with hemodialysis due to being unhomed. Multiple presentations at multiple ED's after missing HD. ?-- appreciate nephrology assistance ?- plan to transition patient back to TTS schedule tomorrow ? ?Anemia of ESRD ?Acute blood loss anemia ?Status post 3 units PRBCs.  She remains asymptomatic from this. Hemoglobin has remained stable. ?-Aranesp 100 mg with HD per nephro ?-Trend CBC, transfuse if hemoglobin less than 7 ?  ?History of borderline personality disorder ?Major depressive disorder ?Generalized anxiety disorder ?[facility].  Home medications: Abilify 10 mg (patient-reported dose, 7.5 documented), gabapentin 300 mg 3 times daily, and hydroxyzine 25 mg 3 times daily as needed. ?- Continue home Abilify 7.5 mg and hydroxyzine 25 mg PRN anxiety ?- TOC  consult for unhomed status; appreciate assistance ?  ?Hypertension ?Patient non-adherent with home medications. Improved with HD and home losartan.  ?-- Continue home Losartan 50 mg daily ?-- Monitor daily vitals ? ?Prior to Admission Living Arrangement: Unhomed, awaiting housing at [employer]. ?Anticipated Discharge Location: Pending ?Barriers to Discharge: Unhomed status, patient not participating with PT/OT  ?Dispo: To be decided.  ? [name], MD ?02/10/2022, 6:55 AM ?Pager: [phone] ?After 5pm on weekdays and 1pm on weekends: On Call pager [phone]  ?

## 2022-02-10 NOTE — TOC Progression Note (Addendum)
Transition of Care (TOC) - Initial/Assessment Note  ? ? ?Patient Details  ?Name: Natasha Long ?MRN: [id number] ?Date of Birth: 10/16/1996 ? ?Transition of Care (TOC) CM/SW Contact:    ?Paulene Floor Finlee Concepcion, LCSWA ?Phone Number: ?02/10/2022, 10:45 AM ? ?Clinical Narrative:                 ?CSW read that the patient participated with PT on yesterday and contacted [place] to inquire about their ability to accept the patient.  There was no answer.  CSW left a VM requesting a returned call. ? ?CSW contacted [facility] and spoke with Otila Kluver in admissions.  The facility can tentatively extend a bed offer, but the patient would need to receive dialysis on MWF. ? ?CSW spoke with Olivia Mackie, renal navigator, and was informed that the only MWF dialysis beds available are first shift (around 6:15am). ? ?CSW contacted the facility to inquire about the ability to transport a patient to dialysis at 6:15am.  Admissions at the facility will contact the facilities transportation director and inform CSW of the outcome.   ? ?11:51-  CSW contacted by Shirlee Limerick with [facility]  The patient's insurance is not in network with the facility. ?  ?Barriers to Discharge: Continued Medical Work up, Homeless with medical needs ? ? ?Patient Goals and CMS Choice ?  ?  ?  ? ?Expected Discharge Plan and Services ?  ?  ?  ?  ?Living arrangements for the past 2 months: Homeless ?                ?  ?  ?  ?  ?  ?  ?  ?  ?  ?  ? ?Prior Living Arrangements/Services ?Living arrangements for the past 2 months: Homeless ?Lives with:: Other (Comment) ?Patient language and need for interpreter reviewed:: Yes ?       ?Need for Family Participation in Patient Care: Yes (Comment) ?Care giver support system in place?: No (comment) ?  ?Criminal Activity/Legal Involvement Pertinent to Current Situation/Hospitalization: No - Comment as needed ? ?Activities of Daily Living ?Home Assistive Devices/Equipment: None ?ADL Screening (condition at time of  admission) ?Patient's cognitive ability adequate to safely complete daily activities?: Yes ?Is the patient deaf or have difficulty hearing?: No ?Does the patient have difficulty seeing, even when wearing glasses/contacts?: No ?Does the patient have difficulty concentrating, remembering, or making decisions?: No ?Patient able to express need for assistance with ADLs?: Yes ?Does the patient have difficulty dressing or bathing?: No ?Independently performs ADLs?: Yes (appropriate for developmental age) ?Does the patient have difficulty walking or climbing stairs?: No ?Weakness of Legs: Right ?Weakness of Arms/Hands: None ? ?Permission Sought/Granted ?  ?Permission granted to share information with : Yes, Verbal Permission Granted ?   ? Permission granted to share info w AGENCY: Facilities if physical therapy recommends ?   ?   ? ?Emotional Assessment ?Appearance:: Appears older than stated age ?Attitude/Demeanor/Rapport: Engaged ?Affect (typically observed): Appropriate ?Orientation: : Oriented to Situation, Oriented to  Time, Oriented to Place, Oriented to Self ?Alcohol / Substance Use: Illicit Drugs ?Psych Involvement: No (comment) ? ?Admission diagnosis:  End stage renal disease ([place]) [N18.6] ?Dialysis patient, noncompliant ([place]) [Z91.15] ?Effusion of right knee [M25.461] ?Knee pain, acute [M25.569] ?Osteoarthritis of right knee, unspecified osteoarthritis type [M17.11] ?Patient Active Problem List  ? Diagnosis Date Noted  ? Pain management   ? Septic arthritis of multiple joints ([place]) 02/01/2022  ? Group G streptococcal infection 02/01/2022  ? Left wrist  pain   ? Hyperkalemia 01/30/2022  ? Effusion of right knee   ? Arthritis, septic ([place])   ? Dialysis patient, noncompliant ([place])   ? Knee pain, acute 01/29/2022  ? Hypokalemia 01/29/2022  ? Hyperphosphatemia 01/29/2022  ? Chest pain   ? Hypervolemia associated with renal insufficiency 12/23/2021  ? Elevated troponin 12/23/2021  ? Tobacco use 08/11/2021  ? Nipple  discharge 08/11/2021  ? Breast pain 08/11/2021  ? Exposure to sexually transmitted disease (STD) 08/11/2021  ? Complication of vascular dialysis catheter 02/12/2021  ? Moderate protein-calorie malnutrition ([place]) 01/27/2021  ? Allergy, unspecified, initial encounter 01/23/2021  ? Anemia in chronic kidney disease 01/23/2021  ? End stage renal disease ([place]) 01/23/2021  ? Iron deficiency anemia, unspecified 01/23/2021  ? Other specified coagulation defects ([place]) 01/23/2021  ? Secondary hyperparathyroidism of renal origin ([place]) 01/23/2021  ? Anemia   ? Suicidal ideations   ? Influenza vaccine refused 12/19/2020  ? Morbid obesity ([place]) 01/30/2020  ? 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 Cocaine use, 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[place]) 01/30/2020  ? Affective psychosis, bipolar ([place]) 06/27/2019  ? Borderline personality disorder ([place]) 11/16/2018  ? Moderate cannabis use disorder ([place]) 11/16/2018  ? Severe recurrent major depression without psychotic features ([place]) 11/15/2018  ? Chronic hypertension 04/04/2018  ? PTSD (post-traumatic stress disorder) 04/01/2018  ? ?PCP:  [name], MD ?Pharmacy:   ?[facility], [place] - [address] ?[address] ?[place] [address] ?Phone: [phone] Fax: [phone] ? ?[facility] ?[address] ?[place] [address] ?Phone: [phone] Fax: [phone] ? ? ? ? ?Social Determinants of Health (SDOH) Interventions ?  ? ?Readmission Risk Interventions ?No flowsheet data found. ? ? ?

## 2022-02-10 NOTE — Progress Notes (Signed)
?Op Dialysis Orders: ?[facility], TTS, 3.5 hs,BFR 350 / Auto flow DFR ?2K, 2.5 CA EDW 110kg ?Heparin 5000 units ?Mircera 100 mcg last given 11/20/01 ?Calcitriol 1.75 mcg p.o. q. HD  ?Venofer 50 MG q. weekly  ?LUA AVF ?  ?Problem/Plan ?Altered mental status with severe uremia/right knee infection (admit BUN-158 cr 30) 2/2 missed OP HD ,last op HD 01/08/2022.  Has received some serial Hds in hospital- mental status improved.  ?ESRD -T,Th,S. Has been off schedule D/T staffing and census issues.  ?Seen on HD  ?2K bath 138/58 through left arm access ?2.5L net UF goal ? ?Hypertension/volume-BP variable. Resetting EDW. UF as tolerated.  ?Right knee pain //infection /status post knee irrigation and debridement 3/04. R knee cx (+) group g streptococcus. WBC trending back up,  blood cx (-) X 6 days. Ortho and ID following-continue Cefazolin, noted some patient noncompliance with PT ? Left wrist infection status post I&D by hand surgeon 3/03. Reviewed cultures: (+) Group G streptococcus ?Anemia  -a.m. Hgb 8.8 s/p 3 units PRBCs this admit.Aranesp 100 mcg  given 3/4. Avoid iron with infection picture ?Metabolic bone disease -Corrected Ca+ > 10.  PO4 10.6  Switched to Velphoro binders noted has refused some in hospital, 2.0 Ca+ bath. Hold VDRA. Repeat - had been decr from 10.7 to 7.9 and then >30. Will repeat with HD THur ?Nutrition -albumin 1.8 protein supplements , Renal diet. Fluid restrictions.  ?History of borderline personality disorder, major depressive order, generalized anxiety/last reported homeless- plan per admit team ?Dispo-needing SNF, patient expresses the reasoning for frequent missed HD treatments was d/t transportation issues. She reports currently not having a place to live. ? ?Subjective: Some complaints of rt knee discomfort and left hand/ wrist pain but not in distress, next dialysis on schedule Thur ? ? ?Objective ?Vital signs in last 24 hours: ?Vitals:  ? 02/10/22 0627 02/10/22 0830 02/10/22 0845 02/10/22 0900   ?BP: 134/69 (!) 153/76 137/78 130/77  ?Pulse: 86 89 86 90  ?Resp: 18 17    ?Temp: 98.2 ?F (36.8 ?C) (!) 97.4 ?F (36.3 ?C)    ?TempSrc: Oral Oral    ?SpO2: 98% 100%    ?Weight:      ?Height:      ? ?Weight change:  ? ?Physical Exam: ?General: Obese young female in NAD ?Heart: RRR no MRG ?Lungs: CTA, nonlabored breathing ?Abdomen: Obese NABS, NTND ?Extremities: Right knee dressing dry clean with right lower extremity trace edema, none on left, left hand dressing dry intact, very tender right knee and right hand ?Dialysis Access: Left arm AV fistula positive bruit ? ? ?Labs: ?Basic Metabolic Panel: ?Recent Labs  ?Lab 02/08/22 ?0116 02/09/22 ?6144 02/10/22 ?0409  ?NA 134* 134* 133*  ?K 3.9 4.3 4.2  ?CL 93* 92* 92*  ?CO2 27 26 22   ?GLUCOSE 98 96 104*  ?BUN 34* 60* 77*  ?CREATININE 7.25* 10.92* 13.13*  ?CALCIUM 9.3 9.7 9.5  ?PHOS 7.9* 10.6* >30.0*  ? ?Liver Function Tests: ?Recent Labs  ?Lab 02/08/22 ?0116 02/09/22 ?3154 02/10/22 ?0409  ?ALBUMIN 1.7* 1.8* 1.7*  ? ?No results for input(s): LIPASE, AMYLASE in the last 168 hours. ?No results for input(s): AMMONIA in the last 168 hours. ?CBC: ?Recent Labs  ?Lab 02/05/22 ?0086 02/06/22 ?7619 02/07/22 ?1024 02/08/22 ?0116 02/09/22 ?0707  ?WBC 9.8 9.2 10.9* 10.7* 12.3*  ?HGB 8.8* 8.9* 8.9* 9.0* 8.8*  ?HCT 27.1* 28.3* 28.6* 29.2* 28.5*  ?MCV 88.3 90.1 88.8 89.8 90.5  ?PLT 274 324 358 323 383  ? ?  Cardiac Enzymes: ?No results for input(s): CKTOTAL, CKMB, CKMBINDEX, TROPONINI in the last 168 hours. ?CBG: ?No results for input(s): GLUCAP in the last 168 hours. ? ?Studies/Results: ?No results found. ?Medications: ? sodium chloride    ? sodium chloride    ? sodium chloride 10 mL/hr at 01/30/22 1700  ?  ceFAZolin (ANCEF) IV    ? ? acetaminophen  1,000 mg Oral Q8H  ? ARIPiprazole  7.5 mg Oral Daily  ? Chlorhexidine Gluconate Cloth  6 each Topical Q0600  ? darbepoetin (ARANESP) injection - DIALYSIS  100 mcg Intravenous Q Sat-HD  ? heparin  2,500 Units Dialysis Once in dialysis  ? heparin  injection (subcutaneous)  5,000 Units Subcutaneous Q8H  ? HYDROmorphone  4 mg Oral Q4H  ? losartan  50 mg Oral Daily  ? multivitamin  1 tablet Oral Daily  ? pantoprazole  40 mg Oral Daily  ? polyethylene glycol  17 g Oral BID  ? senna-docusate  2 tablet Oral BID  ? sucroferric oxyhydroxide  1,000 mg Oral TID WC  ? ? ? ? ?

## 2022-02-10 NOTE — Progress Notes (Signed)
Patient called asking for pain medication let patient know it is not due yet, asked when let patient know scheuled for 5:30.  Physical Therapy offered voltarin gel, patient got irritable and raised voice stated that it did'nt work, stated was sleeping great is worn out and drained (had dialysis this morning).   I Offered to do gel and patient said "I am done talking". I left the room. ?

## 2022-02-10 NOTE — Plan of Care (Signed)
  Problem: Activity: Goal: Risk for activity intolerance will decrease Outcome: Not Progressing   Problem: Pain Managment: Goal: General experience of comfort will improve Outcome: Not Progressing   

## 2022-02-11 DIAGNOSIS — M0029 Other streptococcal polyarthritis: Secondary | ICD-10-CM | POA: Diagnosis not present

## 2022-02-11 DIAGNOSIS — M25532 Pain in left wrist: Secondary | ICD-10-CM

## 2022-02-11 DIAGNOSIS — Z9115 Patient's noncompliance with renal dialysis: Secondary | ICD-10-CM | POA: Diagnosis not present

## 2022-02-11 LAB — CBC
HCT: 27.4 % — ABNORMAL LOW (ref 36.0–46.0)
Hemoglobin: 8.4 g/dL — ABNORMAL LOW (ref 12.0–15.0)
MCH: 27.6 pg (ref 26.0–34.0)
MCHC: 30.7 g/dL (ref 30.0–36.0)
MCV: 90.1 fL (ref 80.0–100.0)
Platelets: 373 10*3/uL (ref 150–400)
RBC: 3.04 MIL/uL — ABNORMAL LOW (ref 3.87–5.11)
RDW: 15.7 % — ABNORMAL HIGH (ref 11.5–15.5)
WBC: 9.5 10*3/uL (ref 4.0–10.5)
nRBC: 0 % (ref 0.0–0.2)

## 2022-02-11 LAB — C-REACTIVE PROTEIN: CRP: 28.3 mg/dL — ABNORMAL HIGH (ref <1.0)

## 2022-02-11 MED ORDER — HYDROMORPHONE HCL 2 MG PO TABS
3.0000 mg | ORAL_TABLET | ORAL | Status: DC
Start: 2022-02-11 — End: 2022-02-14
  Administered 2022-02-11 – 2022-02-14 (×17): 3 mg via ORAL
  Filled 2022-02-11 (×17): qty 2

## 2022-02-11 MED ORDER — HYDROMORPHONE HCL 2 MG PO TABS: 2.0000 mg | ORAL_TABLET | ORAL | Status: DC

## 2022-02-11 MED ORDER — GABAPENTIN 300 MG PO CAPS
300.0000 mg | ORAL_CAPSULE | Freq: Every day | ORAL | Status: DC
  Administered 2022-02-11 – 2022-02-15 (×5): 300 mg via ORAL
  Filled 2022-02-11 (×5): qty 1

## 2022-02-11 MED ORDER — HYDROMORPHONE HCL 2 MG PO TABS: 4.0000 mg | ORAL_TABLET | ORAL | Status: DC

## 2022-02-11 MED ORDER — CALCITRIOL 0.5 MCG PO CAPS: 1.7500 ug | ORAL_CAPSULE | ORAL | Status: DC

## 2022-02-11 NOTE — Progress Notes (Signed)
Physical Therapy Treatment ?Patient Details ?Name: Natasha Long ?MRN: [id number] ?DOB: 25-May-1996 ?Today's Date: 02/11/2022 ? ? ?History of Present Illness 26 y.o. female presented to ED 01/28/22 for pain in right knee which occurred after she twisted it when she stumbled, after twisting her left ankle.  Right knee x-ray, CT right knee-degenerative joint present, knee effusion present, no fracture present. Needs dialysis after not presenting for dialysis x 2 weeks. 01/30/22 Rt knee aspiration and injection; 01/30/22 due to septic joint underwent Rt knee extensive synovectomy and later meniscal debridement; also developed left wrist pain and had arthrotomy for irrigation due to infection;    PMH nephrotic syndrome now on HD TThS, HTN, diastolic heart failure, borderline personality disorder, generalized anxiety, major depressive disorder, and recent homelessness ? ?  ?PT Comments  ? ? Continuing work on functional mobility and activity tolerance;  Natasha Long had just spoken to her SW at [facility] when this PT arrived; She agreed to practice getting up and moving;  ? ?Min assist and gentle, targeted cues for initiation and technique for getting up to EOB in a way that minimizes R knee motion (and therefore pain); Once up at EOB, Natasha Long needed to get to the [facility], Overall min assist and multimodal cues for technqiue; Once on [facility], she effectively moved her bowels (first time since admission, RN notified) and voided; While OOB, pt gave herself a sponge bath; Assisted pt back to bed, and re-wrapped R knee after she applied Voltaren gel to knee;  ? ?During transfer, pt asked about using crutches -- she is WBAT L hand and wrist, and using crutches is not entirely out of the question; Still, I would like to see her move consistently with the platform RW, which give much more support and stability than crutches; crutches are NOT recommended at this time, but I wonder if the possibility would give her incentive to  work ?  ?Recommendations for follow up therapy are one component of a multi-disciplinary discharge planning process, led by the attending physician.  Recommendations may be updated based on patient status, additional functional criteria and insurance authorization. ? ?Follow Up Recommendations ? Skilled nursing-short term rehab (<3 hours/day) ?  ?  ?Assistance Recommended at Discharge Frequent or constant Supervision/Assistance  ?Patient can return home with the following Assistance with cooking/housework;Assist for transportation;A lot of help with walking and/or transfers;A lot of help with bathing/dressing/bathroom;Help with stairs or ramp for entrance ?  ?Equipment Recommendations ? Other (comment) (Will start with platfrom RW)  ?  ?Recommendations for Other Services Other (comment) (Will consider requesting OT consult) ? ? ?  ?Precautions / Restrictions Precautions ?Precautions: Fall ?Precaution Comments: L wrist splint for comfort; L wrist/digit ROM ok ?Restrictions ?RLE Weight Bearing: Weight bearing as tolerated ?Other Position/Activity Restrictions: Encourage R knee ROM as tolerated  ?  ? ?Mobility ? Bed Mobility ?Overal bed mobility: Needs Assistance ?Bed Mobility: Supine to Sit, Sit to Supine ?  ?  ?Supine to sit: Min assist ?Sit to supine: Min assist ?  ?General bed mobility comments: Pt used belt to help her RLE  move; Min assist to support RLE coming off of bed; cues, and min assist with bed pad to help scoot R hip closer ot EOB; min assist to help RLE back inot bed ?  ? ?Transfers ?Overall transfer level: Needs assistance ?Equipment used: Left platform walker ?Transfers: Sit to/from Stand, Bed to chair/wheelchair/BSC ?Sit to Stand: Min assist ?  ?Step pivot transfers: Min assist ?  ?  ?  ?  General transfer comment: Cues for hand placement and technqiue; tends to pull up on platform RW, and needed assist to steady it; Gentle min assist and manual facilitation to weight shift forward and initiate  transition to standing; stood essentially on LLE, and kept RLE either tWB or NWB; pivot Hop-steps bed <>BSC; in the middle of step pivoting back to bed, pt asked about using crutches ?  ? ?Ambulation/Gait ?  ?  ?  ?  ?  ?  ?  ?  ? ? ?Stairs ?  ?  ?  ?  ?  ? ? ?Wheelchair Mobility ?  ? ?Modified Rankin (Stroke Patients Only) ?  ? ? ?  ?Balance   ?  ?Sitting balance-Leahy Scale: Good ?  ?  ?  ?Standing balance-Leahy Scale: Poor ?Standing balance comment: Needs bil UE support on L platform RW in standing ?  ?  ?  ?  ?  ?  ?  ?  ?  ?  ?  ?  ? ?  ?Cognition Arousal/Alertness: Awake/alert ?Behavior During Therapy: [facility] for tasks assessed/performed (variable) ?Overall Cognitive Status: Within Functional Limits for tasks assessed ?  ?  ?  ?  ?  ?  ?  ?  ?  ?  ?  ?  ?  ?  ?  ?  ?General Comments: At start of session, pt thanked this  PT for not "giving up" on her; Noting much improved ability to maintain composure, including when in pain; Tended to get impulsive towards end of session, and "barked" orders at times, but still able to control her volume ?  ?  ? ?  ?Exercises Other Exercises ?Other Exercises: Discussed adn demonstrated using R hand to help provide AAROM to L fingers and thumb ? ?  ?General Comments General comments (skin integrity, edema, etc.): Once back in bed, pt able to unwrap R knee and apply Voltaren gel; Assisted pt in ACE wrapping R knee, positioning ?  ?  ? ?Pertinent Vitals/Pain Pain Assessment ?Pain Assessment: Faces ?Faces Pain Scale: Hurts even more ?Pain Location: R knee as her foot cleared EOB ?Pain Descriptors / Indicators: Grimacing, Crying ?Pain Intervention(s): Premedicated before session  ? ? ?Home Living   ?  ?  ?  ?  ?  ?  ?  ?  ?  ?   ?  ?Prior Function    ?  ?  ?   ? ?PT Goals (current goals can now be found in the care plan section) Acute Rehab PT Goals ?Patient Stated Goal: To get up and use the restroom ?PT Goal Formulation: With patient ?Time For Goal Achievement: 02/15/22 ?Potential  to Achieve Goals: Fair ?Progress towards PT goals: Progressing toward goals ? ?  ?Frequency ? ? ? Min 2X/week ? ? ? ?  ?PT Plan Current plan remains appropriate  ? ? ?Co-evaluation   ?  ?  ?  ?  ? ?  ?AM-PAC PT "6 Clicks" Mobility   ?Outcome Measure ? Help needed turning from your back to your side while in a flat bed without using bedrails?: None ?Help needed moving from lying on your back to sitting on the side of a flat bed without using bedrails?: A Little ?Help needed moving to and from a bed to a chair (including a wheelchair)?: A Lot ?Help needed standing up from a chair using your arms (e.g., wheelchair or bedside chair)?: A Lot ?Help needed to walk in hospital room?: A Lot ?Help needed climbing  3-5 steps with a railing? : Total ?6 Click Score: 14 ? ?  ?End of Session Equipment Utilized During Treatment: Gait belt ?Activity Tolerance: Patient limited by pain ?Patient left: in bed;with call bell/phone within reach ?Nurse Communication: Mobility status ?PT Visit Diagnosis: Other abnormalities of gait and mobility (R26.89);Pain ?Pain - Right/Left:  (R knee, L wrist and hand) ?Pain - part of body:  (R knee, L wrist and hand) ?  ? ? ?Time: 1055-1140 (minus about 5 minutes while pt was on teh commode) ?PT Time Calculation (min) (ACUTE ONLY): 45 min ? ?Charges:  $Therapeutic Activity: 38-52 mins          ?          ? ?Roney Marion, PT  ?Acute Rehabilitation Services ?Pager [phone] ?Office [phone] ? ? ? ?[name] ?02/11/2022, 4:59 PM ? ?

## 2022-02-11 NOTE — Progress Notes (Signed)
?Op Dialysis Orders: ?[facility], TTS, 3.5 hs,BFR 350 / Auto flow DFR ?2K, 2.5 CA EDW 110kg (will need new lower EDW) ?Heparin 5000 units ?Mircera 100 mcg last given 11/20/01 ?Calcitriol 1.75 mcg p.o. q. HD  ?Venofer 50 MG q. weekly  ?LUA AVF ?  ?Problem/Plan ?Altered mental status with severe uremia/right knee infection (admit BUN-158 cr 30) 2/2 missed OP HD ,last op HD 01/08/2022.  Has received some serial Hds in hospital- mental status improved.  ?ESRD -T,Th,S. Has been off schedule D/T staffing and census issues. HD 3/14 with 1.4L net UF. Next HD tomorrow. ? ?Hypertension/volume-BP variable. Resetting EDW. UF as tolerated.  ?Right knee pain //infection /status post knee irrigation and debridement 3/04. R knee cx (+) group g streptococcus. WBC trending back up,  blood cx (-) X 6 days. Ortho and ID following-continue Cefazolin, noted some patient noncompliance with PT ? Left wrist infection status post I&D by hand surgeon 3/03. Reviewed cultures: (+) Group G streptococcus ?Anemia  -a.m. Hgb 8.8 s/p 3 units PRBCs this admit.Aranesp 100 mcg  given 3/4. Avoid iron with infection picture ?Metabolic bone disease -Corrected Ca+ > 10.  PO4 10.6  Switched to Velphoro binders noted has refused some in hospital (she has been taking it 1-2 x each day, refusing other times) , 2.0 Ca+ bath. Hold VDRA. Repeat - had been decr from 10.7 to 7.9 and then >30. Will repeat with HD THur ?Nutrition -albumin 1.8 protein supplements , Renal diet. Fluid restrictions.  ?History of borderline personality disorder, major depressive order, generalized anxiety/last reported homeless- plan per admit team ?Dispo-needing SNF, patient expresses the reasoning for frequent missed HD treatments was d/t transportation issues. She reports currently not having a place to live. ? ?Subjective: Some complaints of rt knee discomfort and left hand/ wrist pain but not in distress, next dialysis on schedule Thur. Left hand pain better with  brace. ? ? ?Objective ?Vital signs in last 24 hours: ?Vitals:  ? 02/10/22 1352 02/10/22 1725 02/10/22 2100 02/11/22 0606  ?BP: 118/60 121/69 123/72 131/71  ?Pulse: 96 94 90 94  ?Resp: 16 17 17 18   ?Temp: 99.5 ?F (37.5 ?C) 98.2 ?F (36.8 ?C) 98.6 ?F (37 ?C) 98.2 ?F (36.8 ?C)  ?TempSrc:   Oral   ?SpO2: 97% 97% 98% 96%  ?Weight:      ?Height:      ? ?Weight change:  ? ?Physical Exam: ?General: Obese young female in NAD ?Heart: RRR no MRG ?Lungs: CTA, nonlabored breathing ?Abdomen: Obese NABS, NTND ?Extremities: Right knee dressing dry clean with right lower extremity trace edema, none on left, left hand dressing dry intact, very tender right knee and right hand ?Dialysis Access: Left arm AV fistula positive bruit ? ? ?Labs: ?Basic Metabolic Panel: ?Recent Labs  ?Lab 02/08/22 ?0116 02/09/22 ?8341 02/10/22 ?0409  ?NA 134* 134* 133*  ?K 3.9 4.3 4.2  ?CL 93* 92* 92*  ?CO2 27 26 22   ?GLUCOSE 98 96 104*  ?BUN 34* 60* 77*  ?CREATININE 7.25* 10.92* 13.13*  ?CALCIUM 9.3 9.7 9.5  ?PHOS 7.9* 10.6* >30.0*  ? ?Liver Function Tests: ?Recent Labs  ?Lab 02/08/22 ?0116 02/09/22 ?9622 02/10/22 ?0409  ?ALBUMIN 1.7* 1.8* 1.7*  ? ?No results for input(s): LIPASE, AMYLASE in the last 168 hours. ?No results for input(s): AMMONIA in the last 168 hours. ?CBC: ?Recent Labs  ?Lab 02/05/22 ?2979 02/06/22 ?8921 02/07/22 ?1024 02/08/22 ?0116 02/09/22 ?0707  ?WBC 9.8 9.2 10.9* 10.7* 12.3*  ?HGB 8.8* 8.9* 8.9* 9.0* 8.8*  ?HCT  27.1* 28.3* 28.6* 29.2* 28.5*  ?MCV 88.3 90.1 88.8 89.8 90.5  ?PLT 274 324 358 323 383  ? ?Cardiac Enzymes: ?No results for input(s): CKTOTAL, CKMB, CKMBINDEX, TROPONINI in the last 168 hours. ?CBG: ?No results for input(s): GLUCAP in the last 168 hours. ? ?Studies/Results: ?No results found. ?Medications: ? sodium chloride    ? sodium chloride    ? sodium chloride 10 mL/hr at 01/30/22 1700  ?  ceFAZolin (ANCEF) IV 2 g (02/10/22 1355)  ? ? acetaminophen  1,000 mg Oral Q8H  ? ARIPiprazole  7.5 mg Oral Daily  ? Chlorhexidine  Gluconate Cloth  6 each Topical Q0600  ? darbepoetin (ARANESP) injection - DIALYSIS  100 mcg Intravenous Q Sat-HD  ? heparin  2,500 Units Dialysis Once in dialysis  ? heparin injection (subcutaneous)  5,000 Units Subcutaneous Q8H  ? HYDROmorphone  4 mg Oral Q4H  ? losartan  50 mg Oral Daily  ? multivitamin  1 tablet Oral Daily  ? pantoprazole  40 mg Oral Daily  ? polyethylene glycol  17 g Oral BID  ? senna-docusate  2 tablet Oral BID  ? sucroferric oxyhydroxide  1,000 mg Oral TID WC  ? ? ? ? ?

## 2022-02-11 NOTE — Progress Notes (Signed)
? ?Subjective: Natasha Long; patient minimally participated with PT. ?  ?Patient seen at bedside today, largely somnolent on exam, but awaking to answer questions. She reports continued pain. RN reported that she is awaking to ask for pain medications and returning to sleep.  ? ?Outpatient LCSW from [facility] visited today, and spoke to our team about patient's housing history and noncompliance.  ? ?No other concerns or complaints at this time ? ? ?Objective: ? ?Vital signs in last 24 hours: ?Vitals:  ? 02/10/22 1352 02/10/22 1725 02/10/22 2100 02/11/22 0606  ?BP: 118/60 121/69 123/72 131/71  ?Pulse: 96 94 90 94  ?Resp: 16 17 17 18   ?Temp: 99.5 ?F (37.5 ?C) 98.2 ?F (36.8 ?C) 98.6 ?F (37 ?C) 98.2 ?F (36.8 ?C)  ?TempSrc:   Oral   ?SpO2: 97% 97% 98% 96%  ?Weight:      ?Height:      ? ?Physical Exam ?General: young female, lying in bed, NAD, somnolent ?Pulm: normal work of breathing on RA. ?MSK: RLE and LUE remained largely motionless during encounter, limited by pain.  Again, no warmth of right lower extremity (knee under bandage wrap) nor left hand (wrist in brace, and patient does not allow palpation).  No TTP elicited when checking temperature.  Patient uncooperative with ROM assessment. ?Neuro: Somnolent, no focal deficits noted. ?Psych: Normal mood and affect ? ?Assessment/Plan: ? ?Principal Problem: ?  Septic arthritis of multiple joints ([place]) ?Active Problems: ?  End stage renal disease ([place]) ?  Knee pain, acute ?  Hyperphosphatemia ?  Effusion of right knee ?  Hyperkalemia ?  Arthritis, septic ([place]) ?  Dialysis patient, noncompliant ([place]) ?  Left wrist pain ?  Group G streptococcal infection ?  Pain management ? ?Natasha Long is a 26 year old female with a history of nephrotic syndrome on hemodialysis who has been noncompliant with sessions for the past 2.5 weeks, borderline personality disorder, and recent homelessness who presented with traumatic knee injury after falling down a flight of stairs, found to have  polyarticular septic arthritis. ? ?Acute traumatic knee injury ?Multifocal septic arthritis ?Patient afebrile overnight. CBC showing WBC 9.5. Postop day 12 s/p I&D of right knee and left wrist by ortho, appreciate assistance. Cultures showing group G strep. Patient on cefazolin x6 weeks per ID, appreciate assistance. She is WBAT per ortho, and is now amenable to working with PT after their initial sign-off; continued recommendation is SNF. Biggest issue during stay has been pain control. Pain medication regimen has been adjusted to meet patient's needs while attempting to reduce opioid burden. Has been transitioned off of PCA dilaudid pump IV dilaudid. Pain control will be primarily with oral medications and Voltaren gel moving forward.  ?-IV cefazolin with HD x6 weeks (end date approximately 4/15) ?-tylenol q8 scheduled ?-PO dilaudid 3mg  q4h; decreased dose after PT session today due to patient somnolence.  ?-PO Gabapentin 300 mg qHS ?-Voltaren gel 4g QID PRN ? ?ESRD with HD noncompliance ?Hyperphosphatemia 2/2 missed HD ?Hyperkalemia 2/2 missed HD, resolved  ?Patient reports history of nephrotic syndrome.  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  Patient reports noncompliance with hemodialysis due to being unhomed. Multiple presentations at multiple ED's after missing HD. ?-- appreciate nephrology assistance ?- patient back to TTS schedule ? ?Anemia of ESRD ?Acute blood loss anemia ?Status post 3 units PRBCs.  She remains asymptomatic from this. Hemoglobin has remained stable. ?-Aranesp 100 mg with HD per nephro ?-Trend CBC, transfuse if hemoglobin less than 7 ?  ?History of borderline personality disorder ?Major  depressive disorder ?Generalized anxiety disorder ?Follows at [facility] for care.  Home medications: Abilify 10 mg (patient-reported dose, 7.5 documented), gabapentin 300 mg 3 times daily, and hydroxyzine 25 mg 3 times daily as needed. ?- Continue home Abilify 7.5 mg and hydroxyzine  25 mg PRN anxiety ?- TOC consult for unhomed status; appreciate assistance ?  ?Hypertension ?Patient non-adherent with home medications. Improved with HD and home losartan.  ?-- Continue home Losartan 50 mg daily ?-- Monitor daily vitals ? ?Prior to Admission Living Arrangement: Unhomed, awaiting housing at [employer]. ?Anticipated Discharge Location: Pending ?Barriers to Discharge: Unhomed status, patient not participating with PT/OT  ?Dispo: To be decided.  ? ?[name], MD ?02/11/2022, 7:12 AM ?Pager: [phone] ?After 5pm on weekdays and 1pm on weekends: On Call pager [phone]  ?

## 2022-02-11 NOTE — Progress Notes (Signed)
Provided service recovery on patient. Recent notification was provided that patient may have been potentially exposed to Hepatitis B while in the [facility] ([facility]). Patient verbalized understanding. Patient reports that she completed her Hepatitis vaccine series previously. All questions have been answered. Attending MD and Infection Disease MD have been notified and will continue to follow patient.  ? ?Dorthey Sawyer, RN  ?Dialysis Nurse Coordinator ?[phone] ? ? ?

## 2022-02-12 DIAGNOSIS — M25532 Pain in left wrist: Secondary | ICD-10-CM | POA: Diagnosis not present

## 2022-02-12 DIAGNOSIS — M25561 Pain in right knee: Secondary | ICD-10-CM | POA: Diagnosis not present

## 2022-02-12 DIAGNOSIS — N186 End stage renal disease: Secondary | ICD-10-CM | POA: Diagnosis not present

## 2022-02-12 DIAGNOSIS — M0029 Other streptococcal polyarthritis: Secondary | ICD-10-CM | POA: Diagnosis not present

## 2022-02-12 LAB — CBC
HCT: 25.5 % — ABNORMAL LOW (ref 36.0–46.0)
Hemoglobin: 7.9 g/dL — ABNORMAL LOW (ref 12.0–15.0)
MCH: 28 pg (ref 26.0–34.0)
MCHC: 31 g/dL (ref 30.0–36.0)
MCV: 90.4 fL (ref 80.0–100.0)
Platelets: 392 10*3/uL (ref 150–400)
RBC: 2.82 MIL/uL — ABNORMAL LOW (ref 3.87–5.11)
RDW: 15.5 % (ref 11.5–15.5)
WBC: 6.6 10*3/uL (ref 4.0–10.5)
nRBC: 0 % (ref 0.0–0.2)

## 2022-02-12 LAB — RENAL FUNCTION PANEL
Albumin: 1.8 g/dL — ABNORMAL LOW (ref 3.5–5.0)
Anion gap: 19 — ABNORMAL HIGH (ref 5–15)
BUN: 70 mg/dL — ABNORMAL HIGH (ref 6–20)
CO2: 24 mmol/L (ref 22–32)
Calcium: 9.1 mg/dL (ref 8.9–10.3)
Chloride: 92 mmol/L — ABNORMAL LOW (ref 98–111)
Creatinine, Ser: 10.76 mg/dL — ABNORMAL HIGH (ref 0.44–1.00)
GFR, Estimated: 5 mL/min — ABNORMAL LOW (ref >60)
Glucose, Bld: 98 mg/dL (ref 70–99)
Phosphorus: 9.4 mg/dL — ABNORMAL HIGH (ref 2.5–4.6)
Potassium: 4.4 mmol/L (ref 3.5–5.1)
Sodium: 135 mmol/L (ref 135–145)

## 2022-02-12 LAB — HIV ANTIBODY (ROUTINE TESTING W REFLEX): HIV Screen 4th Generation wRfx: NONREACTIVE

## 2022-02-12 MED ORDER — HEPARIN SODIUM (PORCINE) 1000 UNIT/ML DIALYSIS: 2500.0000 [IU] | INTRAMUSCULAR | Status: DC | PRN

## 2022-02-12 MED ORDER — HEPARIN SODIUM (PORCINE) 1000 UNIT/ML DIALYSIS
2500.0000 [IU] | INTRAMUSCULAR | Status: DC | PRN
Start: 2022-02-12 — End: 2022-02-12

## 2022-02-12 MED ORDER — HYDROCERIN EX CREA
1.0000 "application " | TOPICAL_CREAM | Freq: Two times a day (BID) | CUTANEOUS | Status: DC
  Administered 2022-02-13 – 2022-02-16 (×7): 1 via TOPICAL
  Filled 2022-02-12 (×2): qty 113

## 2022-02-12 MED ORDER — DIPHENHYDRAMINE-ZINC ACETATE 2-0.1 % EX CREA
1.0000 "application " | TOPICAL_CREAM | Freq: Two times a day (BID) | CUTANEOUS | Status: DC | PRN
  Administered 2022-02-13 – 2022-02-16 (×2): 1 via TOPICAL
  Filled 2022-02-12: qty 28

## 2022-02-12 MED ORDER — SODIUM CHLORIDE 0.9 % IV SOLN: 100.0000 mL | INTRAVENOUS | Status: DC | PRN

## 2022-02-12 MED ORDER — HEPARIN SODIUM (PORCINE) 1000 UNIT/ML DIALYSIS: 5000.0000 [IU] | Freq: Once | INTRAMUSCULAR | Status: DC

## 2022-02-12 NOTE — Progress Notes (Signed)
Subjective: Minimal voice response but currently denies any complaints, for HD today on schedule ? ?Objective ?Vital signs in last 24 hours: ?Vitals:  ? 02/11/22 1736 02/11/22 2028 02/12/22 0615 02/12/22 0913  ?BP: 125/78 129/74 132/77 128/63  ?Pulse: 86 86 89 86  ?Resp: 16 18 18 17   ?Temp: 98.2 ?F (36.8 ?C) 97.8 ?F (36.6 ?C) 98.1 ?F (36.7 ?C) 98.8 ?F (37.1 ?C)  ?TempSrc:  Oral Oral Oral  ?SpO2: 100% 99% 100% 96%  ?Weight:      ?Height:      ? ?Weight change:  ? ?Physical Exam: ?General: Obese young female,NAD ?Heart: RRR no MRG ?Lungs: CTA, nonlabored breathing ?Abdomen: Obese NABS, NTND ?Extremities: Right knee dressing dry clean with right lower extremity trace edema, none on left, left hand brace intact to forearm  ?Dialysis Access: + Bruit L UA AVF ? ?Op Dialysis Orders: ?[facility], TTS, 3.5 hs,BFR 350 / Auto flow DFR ?2K, 2.5 CA EDW 110kg (will need new lower EDW) ?Heparin 5000 units ?Mircera 100 mcg last given 11/20/01 ?Calcitriol 1.75 mcg p.o. q. HD  ?Venofer 50 MG q. weekly  ?LUA AVF ?  ?Problem/Plan ?Altered mental status with severe uremia/right knee infection (admit BUN-158 cr 30) 2/2 missed OP HD ,last op HD 01/08/2022.  Has received some serial Hds in hospital- mental status improved.  ?ESRD -T,Th,S. Has been off schedule D/T staffing and census issues. HD 3/14 with 1.4L net UF. Next HD today on schedule ?Hypertension/volume-BP variable. Resetting EDW. UF as tolerated.  ?Right knee pain //infection /status post knee irrigation and debridement 3/04. R knee cx (+) group g streptococcus. WBC is improved to 9.5 this a.m. <12.3   blood cx (-) so far. Ortho and ID have seen on cefazolin, noted some patient noncompliance with PT and Ortho signed off on 3/13 with "refused physical therapy and CPM multiple times" ? Left wrist infection status post I&D by hand surgeon 3/03. Reviewed cultures: (+) Group G streptococcus ?Anemia  Hgb 8.4 =3/15 s/p 3 units PRBCs this admit.Aranesp 100 mcg  given 3/4. Avoid iron with  infection picture ?Metabolic bone disease -Corrected Ca+ > 10.  PO4 >30 with Velphoro binders noted has refused some in hospital (she has been taking it 1-2 x each day, refusing other times) , 2.0 Ca+ bath. Hold VDRA. Repeat - calcium phosphorus with HD labs today ?Nutrition -albumin 1.8 protein supplements , Renal diet. Fluid restrictions.  ?History of borderline personality disorder, major depressive order, generalized anxiety/last reported homeless- plan per admit team ?Dispo-needing SNF, patient expresses the reasoning for frequent missed HD treatments was d/t transportation issues. She reports currently not having a place to live.  Noted per admit team awaiting housing at [employer] ? ? ?Ernest Haber, PA-C ?[facility] ?Beeper [phone] ?02/12/2022,10:32 AM ? LOS: 14 days  ? ?Labs: ?Basic Metabolic Panel: ?Recent Labs  ?Lab 02/08/22 ?0116 02/09/22 ?3267 02/10/22 ?0409  ?NA 134* 134* 133*  ?K 3.9 4.3 4.2  ?CL 93* 92* 92*  ?CO2 27 26 22   ?GLUCOSE 98 96 104*  ?BUN 34* 60* 77*  ?CREATININE 7.25* 10.92* 13.13*  ?CALCIUM 9.3 9.7 9.5  ?PHOS 7.9* 10.6* >30.0*  ? ?Liver Function Tests: ?Recent Labs  ?Lab 02/08/22 ?0116 02/09/22 ?1245 02/10/22 ?0409  ?ALBUMIN 1.7* 1.8* 1.7*  ? ?No results for input(s): LIPASE, AMYLASE in the last 168 hours. ?No results for input(s): AMMONIA in the last 168 hours. ?CBC: ?Recent Labs  ?Lab 02/06/22 ?0737 02/07/22 ?1024 02/08/22 ?0116 02/09/22 ?0707 02/11/22 ?1013  ?WBC  9.2 10.9* 10.7* 12.3* 9.5  ?HGB 8.9* 8.9* 9.0* 8.8* 8.4*  ?HCT 28.3* 28.6* 29.2* 28.5* 27.4*  ?MCV 90.1 88.8 89.8 90.5 90.1  ?PLT 324 358 323 383 373  ? ?Cardiac Enzymes: ?No results for input(s): CKTOTAL, CKMB, CKMBINDEX, TROPONINI in the last 168 hours. ?CBG: ?No results for input(s): GLUCAP in the last 168 hours. ? ?Studies/Results: ?No results found. ?Medications: ? sodium chloride 10 mL/hr at 01/30/22 1700  ?  ceFAZolin (ANCEF) IV 2 g (02/10/22 1355)  ? ? acetaminophen  1,000 mg Oral Q8H  ?  ARIPiprazole  7.5 mg Oral Daily  ? Chlorhexidine Gluconate Cloth  6 each Topical Q0600  ? darbepoetin (ARANESP) injection - DIALYSIS  100 mcg Intravenous Q Sat-HD  ? gabapentin  300 mg Oral QHS  ? heparin injection (subcutaneous)  5,000 Units Subcutaneous Q8H  ? HYDROmorphone  3 mg Oral Q4H  ? losartan  50 mg Oral Daily  ? multivitamin  1 tablet Oral Daily  ? pantoprazole  40 mg Oral Daily  ? polyethylene glycol  17 g Oral BID  ? senna-docusate  2 tablet Oral BID  ? sucroferric oxyhydroxide  1,000 mg Oral TID WC  ? ? ? ? ?

## 2022-02-12 NOTE — Progress Notes (Signed)
removed 964mls net fluid. upon arrival patient asking for pain medicine,  gave dilaudid po and tylenol as scheduled.  pt clotted mid treatment approx blood loss of 247ml. pt tried to sign off ama 1 hour at this time,  educated patient on uremia, phosphorous importance of finished treatment for cleaning , pt said she was cramping, restarted tx with no fluid removal,  gave ancef during this time.  20 minutes after restart pt states she feels like she is going to pass out asking for saline,  ended tx at this time nephrology at bedside.  pre bp 158/71  post bp 128/71 pre weight 97.2kg post weight 96.0kg bed scales ?

## 2022-02-12 NOTE — Progress Notes (Signed)
? ?Subjective: NAEON; patient participated well with PT yesterday. ?  ?Patient seen at bedside today, initially  somnolent on exam, but woke to answer questions and was very pleasant. She reports continued pain but less in her RLE, but informed that she has been improving her mobility with PT. She reported that she received her housing voucher from her LCSW yesterday, and she was excited about that. She inquired about discharge and the barriers to discharge were discussed with her. ? ?Other complaints today include diffuse itching of her chest and back, with some skin peeling of her back. As well, she notes a yellowish vaginal discharge, that has been present for "a while" (unable to quantify). She has concerns for herpetic whitlow on her fingers that she says have had I&D with purulence. She denies genital lesions c/w herpes. ? ? ?Objective: ? ?Vital signs in last 24 hours: ?Vitals:  ? 02/11/22 1736 02/11/22 2028 02/12/22 0615 02/12/22 0913  ?BP: 125/78 129/74 132/77 128/63  ?Pulse: 86 86 89 86  ?Resp: 16 18 18 17   ?Temp: 98.2 ?F (36.8 ?C) 97.8 ?F (36.6 ?C) 98.1 ?F (36.7 ?C) 98.8 ?F (37.1 ?C)  ?TempSrc:  Oral Oral Oral  ?SpO2: 100% 99% 100% 96%  ?Weight:      ?Height:      ? ?Physical Exam ?General: young female, lying in bed, NAD, initially somnolent then alert and   ?Cardio: RRR, no m/r/g. ?Pulm: normal work of breathing on RA. Lungs CTAB ?MSK: RLE with motion as patient adjusted herself in bed, sitting up. No pain elicited. No warmth of RLE. LUE still with pain in brace. Patient unwrapped brace and incision site clean with no erythema, warmth, or drainage. Sutures in place. No TTP elicited when checking temperature.  ?Neuro: A&O x 3, no focal deficits noted. ?Psych:Upbeat mood and affect ? ?Assessment/Plan: ? ?Principal Problem: ?  Septic arthritis of multiple joints ([place]) ?Active Problems: ?  End stage renal disease ([place]) ?  Knee pain, acute ?  Hyperphosphatemia ?  Effusion of right knee ?  Hyperkalemia ?   Arthritis, septic ([place]) ?  Dialysis patient, noncompliant ([place]) ?  Left wrist pain ?  Group G streptococcal infection ?  Pain management ? ?Natasha Long is a 26 year old female with a history of nephrotic syndrome on hemodialysis who has been noncompliant with sessions for the past 2.5 weeks, borderline personality disorder, and recent homelessness who presented with traumatic knee injury after falling down a flight of stairs, found to have polyarticular septic arthritis, who has been improving drastically clinically with treatment although initially refusing participation with PT/OT. ? ?Acute traumatic knee injury ?Multifocal septic arthritis ?Patient remains afebrile. CBC showing WBC 9.5. Postop day 13 s/p I&D of right knee and left wrist by ortho, appreciate assistance. Cultures showing group G strep. Patient on cefazolin x6 weeks per ID, appreciate assistance. She is WBAT per ortho, and is now amenable to working with PT after their initial sign-off; continued recommendation is SNF. Biggest issue during stay has been pain control. Pain medication regimen has been adjusted to meet patient's needs while attempting to reduce opioid burden. Has been transitioned off of PCA dilaudid pump IV dilaudid. Pain control will be primarily with oral medications and Voltaren gel moving forward.  ?-IV cefazolin with HD x6 weeks (end date approximately 4/15) ?-tylenol q8 scheduled ?-PO dilaudid 3mg  q4h; will decrease to 2 mg q4h  after working with PT tomorrow. ?-PO Gabapentin 300 mg qHS ?-Voltaren gel 4g QID PRN ? ?ESRD with HD noncompliance ?  Hyperphosphatemia 2/2 missed HD ?Hyperkalemia 2/2 missed HD, resolved  ?Patient reports history of nephrotic syndrome.  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  Patient reports noncompliance with hemodialysis due to being unhomed. Multiple presentations at multiple ED's after missing HD. ?-- appreciate nephrology assistance ?- patient back to TTS schedule ? ?Anemia of ESRD ?Acute blood loss  anemia ?Status post 3 units PRBCs.  She remains asymptomatic from this. Hemoglobin has remained stable. ?-Aranesp 100 mg with HD per nephro ?-Trend CBC, transfuse if hemoglobin less than 7 ?  ?History of borderline personality disorder ?Major depressive disorder ?Generalized anxiety disorder ?Follows at [facility] for care.  Home medications: Abilify 10 mg (patient-reported dose, 7.5 documented), gabapentin 300 mg 3 times daily, and hydroxyzine 25 mg 3 times daily as needed. ?- Continue home Abilify 7.5 mg and hydroxyzine 25 mg PRN anxiety ?- TOC consult for unhomed status; appreciate assistance. Outpatient LCSW provided patient with housing voucher. ?  ?Hypertension ?Patient non-adherent with home medications. Improved with HD and home losartan.  ?-- Continue home Losartan 50 mg daily ?-- Monitor daily vitals ? ?Prior to Admission Living Arrangement: Unhomed, awaiting housing at [employer]. ?Anticipated Discharge Location: Pending ?Barriers to Discharge: Unhomed status, patient not participating with PT/OT  ?Dispo: To be decided.  ? ?Natasha Schlatter, MD ?02/12/2022, 1:17 PM ?Pager: [phone] ?After 5pm on weekdays and 1pm on weekends: On Call pager [phone]  ?

## 2022-02-12 NOTE — Plan of Care (Signed)
  Problem: Pain Managment: Goal: General experience of comfort will improve Outcome: Progressing   Problem: Safety: Goal: Ability to remain free from injury will improve Outcome: Progressing   

## 2022-02-12 NOTE — Progress Notes (Signed)
Pt. refused labs, education given. Pt. became aggressive verbally, raising voice, using obscenities.  ?

## 2022-02-12 NOTE — Progress Notes (Signed)
Pharmacy Antibiotic Note ? ?Natasha Long is a 26 y.o. female on day # 13 Cefazolin for septic arthritis.  ? ?  Cefazolin 2gm IV scheduled MWF after HD, which had been off schedule. Last Cefazolin dose 3/14 after HD.   ? ?Plan: ?Cefazolin 2gm IV TTS after HD ?Plan 6 weeks antibioic therapy per ID. ? ?Height: 5\' 8"  (172.7 cm) ?Weight: 97.5 kg (214 lb 15.2 oz) ?IBW/kg (Calculated) : 63.9 ? ?Temp (24hrs), Avg:98.1 ?F (36.7 ?C), Min:97.8 ?F (36.6 ?C), Max:98.2 ?F (36.8 ?C) ? ?Recent Labs  ?Lab 02/06/22 ?0737 02/07/22 ?1024 02/08/22 ?0116 02/09/22 ?0707 02/10/22 ?0409 02/11/22 ?1013  ?WBC 9.2 10.9* 10.7* 12.3*  --  9.5  ?CREATININE 12.87* 9.50* 7.25* 10.92* 13.13*  --   ? ?  ?Estimated Creatinine Clearance: 7.9 mL/min (A) (by C-G formula based on SCr of 13.13 mg/dL (H)).   ? ?Allergies  ?Allergen Reactions  ? Prozac [Fluoxetine Hcl] Other (See Comments)  ?  Panic attack   ? Wellbutrin [Bupropion] Other (See Comments)  ?  Panic attack  ? Prednisone Other (See Comments)  ?  Pt states that this med caused pancreatitis.   ? ? ?Antimicrobials this admission: ?Vancomycin 3/3 >>3/5 ?Cefepime 3/3 >>3/3 ?Cefazolin 3/4>> ? ?Microbiology results: ?3/3 Blood: negative  ?3/3 R knee synovial fluid: group G Strep, R to clinda, erythro ?3/3 L wrist synovial fluid: group G Strep ?3/3 COVID and flu: negative ? ?Natasha Long A. Levada Dy, PharmD, BCPS, FNKF ?Clinical Pharmacist ?[place] ?Please utilize Amion for appropriate phone number to reach the unit pharmacist ([place]) ? ?02/12/2022 7:29 AM ? ?

## 2022-02-13 DIAGNOSIS — N186 End stage renal disease: Secondary | ICD-10-CM | POA: Diagnosis not present

## 2022-02-13 DIAGNOSIS — A539 Syphilis, unspecified: Secondary | ICD-10-CM

## 2022-02-13 DIAGNOSIS — M0029 Other streptococcal polyarthritis: Secondary | ICD-10-CM | POA: Diagnosis not present

## 2022-02-13 LAB — RPR
RPR Ser Ql: REACTIVE — AB
RPR Titer: 1:2 {titer}

## 2022-02-13 LAB — C-REACTIVE PROTEIN: CRP: 25.5 mg/dL — ABNORMAL HIGH (ref <1.0)

## 2022-02-13 MED ORDER — SODIUM CHLORIDE 0.9 % IV SOLN: 100.0000 mL | INTRAVENOUS | Status: DC | PRN

## 2022-02-13 MED ORDER — PENTAFLUOROPROP-TETRAFLUOROETH EX AERO: 1.0000 "application " | INHALATION_SPRAY | CUTANEOUS | Status: DC | PRN

## 2022-02-13 MED ORDER — RAMELTEON 8 MG PO TABS
8.0000 mg | ORAL_TABLET | Freq: Every day | ORAL | Status: DC
  Administered 2022-02-13 – 2022-02-15 (×3): 8 mg via ORAL
  Filled 2022-02-13 (×5): qty 1

## 2022-02-13 MED ORDER — HEPARIN SODIUM (PORCINE) 1000 UNIT/ML DIALYSIS: 1000.0000 [IU] | INTRAMUSCULAR | Status: DC | PRN

## 2022-02-13 MED ORDER — ALTEPLASE 2 MG IJ SOLR: 2.0000 mg | Freq: Once | INTRAMUSCULAR | Status: DC | PRN

## 2022-02-13 MED ORDER — HYDROXYZINE HCL 25 MG PO TABS
50.0000 mg | ORAL_TABLET | Freq: Three times a day (TID) | ORAL | Status: DC | PRN
  Administered 2022-02-13 – 2022-02-16 (×8): 50 mg via ORAL
  Filled 2022-02-13 (×8): qty 2

## 2022-02-13 MED ORDER — LIDOCAINE-PRILOCAINE 2.5-2.5 % EX CREA
1.0000 "application " | TOPICAL_CREAM | CUTANEOUS | Status: DC | PRN
  Filled 2022-02-13: qty 5

## 2022-02-13 MED ORDER — LIDOCAINE HCL (PF) 1 % IJ SOLN: 5.0000 mL | INTRAMUSCULAR | Status: DC | PRN

## 2022-02-13 NOTE — Plan of Care (Signed)
?  Problem: Activity: ?Goal: Risk for activity intolerance will decrease ?Outcome: Progressing ?  ?Problem: Pain Managment: ?Goal: General experience of comfort will improve ?Outcome: Progressing ?  ?Problem: Health Behavior/Discharge Planning: ?Goal: Ability to manage health-related needs will improve ?Outcome: Progressing ?  ?

## 2022-02-13 NOTE — Progress Notes (Signed)
?[facility] ?Progress Note  ? ?Subjective:    ?Seen and examined patient at bedside. Patient sleeping but responds to voice. She denies SOB and CP. Noted yesterday's HD with net UF around 900cc. Reviewed HD notes-patient was c/o cramping and feeling of passing out-tx yesterday ended early. Plan for HD 3/18-UF as tolerated. ? ?Objective ?Vitals:  ? 02/12/22 1647 02/12/22 1724 02/12/22 2157 02/13/22 0554  ?BP: (!) 131/57 128/71 124/75 135/81  ?Pulse: 83 100 92 89  ?Resp: 20 19 18 18   ?Temp:  98.2 ?F (36.8 ?C) 97.9 ?F (36.6 ?C) 98 ?F (36.7 ?C)  ?TempSrc:   Oral Oral  ?SpO2:  98%  100%  ?Weight: 93.7 kg     ?Height:      ? ?Physical Exam ?General: Obese young female,NAD ?Heart: RRR no MRG ?Lungs: CTA, nonlabored breathing ?Abdomen: Obese NABS, NTND ?Extremities: Right knee dressing dry clean with right lower extremity trace edema, none on left, left hand brace intact to forearm  ?Dialysis Access: + Bruit L UA AVF ? ?Filed Weights  ? 02/07/22 1630 02/12/22 1355 02/12/22 1647  ?Weight: 97.5 kg 97.2 kg 93.7 kg  ? ? ?Intake/Output Summary (Last 24 hours) at 02/13/2022 0851 ?Last data filed at 02/13/2022 0600 ?Gross per 24 hour  ?Intake 1463 ml  ?Output 913 ml  ?Net 550 ml  ? ? ?Additional Objective ?Labs: ?Basic Metabolic Panel: ?Recent Labs  ?Lab 02/09/22 ?0707 02/10/22 ?0409 02/12/22 ?1420  ?NA 134* 133* 135  ?K 4.3 4.2 4.4  ?CL 92* 92* 92*  ?CO2 26 22 24   ?GLUCOSE 96 104* 98  ?BUN 60* 77* 70*  ?CREATININE 10.92* 13.13* 10.76*  ?CALCIUM 9.7 9.5 9.1  ?PHOS 10.6* >30.0* 9.4*  ? ?Liver Function Tests: ?Recent Labs  ?Lab 02/09/22 ?0707 02/10/22 ?0409 02/12/22 ?1420  ?ALBUMIN 1.8* 1.7* 1.8*  ? ?No results for input(s): LIPASE, AMYLASE in the last 168 hours. ?CBC: ?Recent Labs  ?Lab 02/07/22 ?1024 02/08/22 ?0116 02/09/22 ?0707 02/11/22 ?1013 02/12/22 ?1420  ?WBC 10.9* 10.7* 12.3* 9.5 6.6  ?HGB 8.9* 9.0* 8.8* 8.4* 7.9*  ?HCT 28.6* 29.2* 28.5* 27.4* 25.5*  ?MCV 88.8 89.8 90.5 90.1 90.4  ?PLT 358 323 383 373 392   ? ?Blood Culture ?   ?Component Value Date/Time  ? SDES WOUND 01/30/2022 1249  ? SPECREQUEST LEFT WRIST SYNOVIAL FLUID SPEC B 01/30/2022 1249  ? CULT  01/30/2022 1249  ?  MODERATE STREPTOCOCCUS GROUP G ?Beta hemolytic streptococci are predictably susceptible to penicillin and other beta lactams. Susceptibility testing not routinely performed. ?NO ANAEROBES ISOLATED ?Performed at [facility], [place] [address] [place], [place] [address] ?  ? REPTSTATUS 02/04/2022 FINAL 01/30/2022 1249  ? ? ?Cardiac Enzymes: ?No results for input(s): CKTOTAL, CKMB, CKMBINDEX, TROPONINI in the last 168 hours. ?CBG: ?No results for input(s): GLUCAP in the last 168 hours. ?Iron Studies: No results for input(s): IRON, TIBC, TRANSFERRIN, FERRITIN in the last 72 hours. ?Lab Results  ?Component Value Date  ? INR 1.4 (H) 01/30/2022  ? INR 1.0 08/17/2019  ? ?Studies/Results: ?No results found. ? ?Medications: ? sodium chloride 10 mL/hr at 01/30/22 1700  ?  ceFAZolin (ANCEF) IV Stopped (02/12/22 1800)  ? ? acetaminophen  1,000 mg Oral Q8H  ? ARIPiprazole  7.5 mg Oral Daily  ? Chlorhexidine Gluconate Cloth  6 each Topical Q0600  ? darbepoetin (ARANESP) injection - DIALYSIS  100 mcg Intravenous Q Sat-HD  ? gabapentin  300 mg Oral QHS  ? heparin injection (subcutaneous)  5,000 Units  Subcutaneous Q8H  ? hydrocerin  1 application. Topical BID  ? HYDROmorphone  3 mg Oral Q4H  ? losartan  50 mg Oral Daily  ? multivitamin  1 tablet Oral Daily  ? pantoprazole  40 mg Oral Daily  ? polyethylene glycol  17 g Oral BID  ? senna-docusate  2 tablet Oral BID  ? sucroferric oxyhydroxide  1,000 mg Oral TID WC  ? ? ?Dialysis Orders: ?[facility], TTS, 3.5 hs,BFR 350 / Auto flow DFR ?2K, 2.5 CA EDW 110kg (will need new lower EDW) ?Heparin 5000 units ?Mircera 100 mcg last given 11/20/01 ?Calcitriol 1.75 mcg p.o. q. HD  ?Venofer 50 MG q. weekly  ?LUA AVF ? ?Assessment/Plan: ?Altered mental status with severe uremia/right knee infection (admit BUN-158 cr 30) 2/2 missed  OP HD ,last op HD 01/08/2022.  Has received some serial Hds in hospital- mental status improved.  ?ESRD -T,Th,S. Has been off schedule D/T staffing and census issues. HD 3/14 with 1.4L net UF. Only tolerated around 900cc net UF with yesterday's HD-according to post-HD notes, pt c/o cramping and feeling of passing out causing to end tx early. Plan for HD 3/18 with UF as tolerated. ?Hypertension/volume-BP variable. Resetting EDW. UF as tolerated.  ?Right knee pain //infection /status post knee irrigation and debridement 3/04. R knee cx (+) group g streptococcus. WBC is improved to 6.6 this AM. Blood cx (-) so far. Ortho and ID have seen on cefazolin, noted some patient noncompliance with PT and Ortho signed off on 3/13 with "refused physical therapy and CPM multiple times" ? Left wrist infection status post I&D by hand surgeon 3/03. Reviewed cultures: (+) Group G streptococcus ?Anemia  Hgb trending down-now 7.9 =3/15 s/p 3 units PRBCs this admit.Aranesp 100 mcg-dose due tomorrow with HD, will raise dose. Avoid iron with infection picture ?Metabolic bone disease -Corrected Ca+ > 10.  PO4 was >30, now trending down but still high-9.4. On Velphoro binders noted has refused some in hospital (she has been taking it 1-2 x each day, refusing other times) , 2.0 Ca+ bath. Hold VDRA.  ?Nutrition -albumin 1.8 protein supplements , Renal diet. Fluid restrictions.  ?History of borderline personality disorder, major depressive order, generalized anxiety/last reported homeless- plan per admit team ?Dispo-needing SNF, patient expresses the reasoning for frequent missed HD treatments was d/t transportation issues. She reports currently not having a place to live.  Noted per admit team awaiting housing at [employer] ?  ? ?Tobie Poet, NP ?[facility] ?02/13/2022,8:51 AM ? LOS: 15 days  ?  ?

## 2022-02-13 NOTE — Progress Notes (Signed)
? ?Subjective: Patient reported cramping and feeling lightheaded with HD, so session ended early.  ?  ?Patient seen at bedside today, awake and alert. She is pleasant today, and reports no acute distress. She continues to complain of itching to her back and chest. She also reports difficulty sleeping and asks for an aid.  ? ?She was informed of her positive RPR result and discussed next steps. She voiced understanding.  ? ? ?Objective: ? ?Vital signs in last 24 hours: ?Vitals:  ? 02/12/22 1647 02/12/22 1724 02/12/22 2157 02/13/22 0554  ?BP: (!) 131/57 128/71 124/75 135/81  ?Pulse: 83 100 92 89  ?Resp: 20 19 18 18   ?Temp:  98.2 ?F (36.8 ?C) 97.9 ?F (36.6 ?C) 98 ?F (36.7 ?C)  ?TempSrc:   Oral Oral  ?SpO2:  98%  100%  ?Weight: 93.7 kg     ?Height:      ? ?Physical Exam ?General: young female, lying in bed, NAD, alert ?Pulm: normal work of breathing on RA. ?MSK: RLE with motion as patient adjusted herself in bed, sitting up. No pain elicited. LUE still with limited mobility in brace. ?Skin: Back with significant excoriations (from patient scratching) and peeling skin. Similar appearance of skin around umbilicus. Maybe secondary syphilis. ?Neuro: A&O x 3, no focal deficits noted. ?Psych:Upbeat mood and affect ? ?Assessment/Plan: ? ?Principal Problem: ?  Septic arthritis of multiple joints ([place]) ?Active Problems: ?  End stage renal disease ([place]) ?  Knee pain, acute ?  Hyperphosphatemia ?  Effusion of right knee ?  Hyperkalemia ?  Arthritis, septic ([place]) ?  Dialysis patient, noncompliant ([place]) ?  Left wrist pain ?  Group G streptococcal infection ?  Pain management ? ?Natasha Long is a 26 year old female with a history of nephrotic syndrome on hemodialysis who has been noncompliant with sessions for the past 2.5 weeks, borderline personality disorder, and recent homelessness who presented with traumatic knee injury after falling down a flight of stairs, found to have polyarticular septic arthritis, who has been improving  drastically clinically with treatment and compliance with PT. ? ?Acute traumatic knee injury ?Multifocal septic arthritis ?Patient remains afebrile. CBC showing WBC 9.5. Postop day 13 s/p I&D of right knee and left wrist by ortho, appreciate assistance. Cultures showing group G strep. Patient on cefazolin x6 weeks per ID, appreciate assistance. She is WBAT per ortho, and is now amenable to working with PT after their initial sign-off; continued recommendation is SNF. Biggest issue during stay has been pain control. Pain medication regimen has been adjusted to meet patient's needs while attempting to reduce opioid burden. Has been transitioned off of PCA dilaudid pump IV dilaudid. Pain control will be primarily with oral medications and Voltaren gel moving forward. CRP downtrending appropriately.  ?-IV cefazolin with HD x6 weeks (end date approximately 4/15) ?-tylenol q8 scheduled ?-PO dilaudid decreased to 2 mg q4h after working with PT today ?-PO Gabapentin 300 mg qHS ?-Voltaren gel 4g QID PRN ? ?Suspected STI ?Patient reports ongoing yellowish vaginal discharge but denies genital lesions or pain. Recent hx of Gonorrhea 12/27/21, and unclear if patient completed treatment.  ?- STI panel ordered: RPR reactive (T. Pallidum total Ab pending), HIV NR, Cervicovaginal ancillary to be collected. ? ?ESRD with HD noncompliance ?Hyperphosphatemia 2/2 missed HD ?Hyperkalemia 2/2 missed HD, resolved  ?Patient reports history of nephrotic syndrome.  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  Patient reports noncompliance with hemodialysis due to being unhomed. Multiple presentations at multiple ED's after missing HD. ?-- appreciate nephrology assistance ?-  patient back to TTS schedule ? ?Anemia of ESRD ?Acute blood loss anemia ?Status post 3 units PRBCs.  She remains asymptomatic from this. Hemoglobin has remained stable. ?-Aranesp 100 mg with HD per nephro ?-Trend CBC, transfuse if hemoglobin less than 7 ?  ?History of borderline  personality disorder ?Major depressive disorder ?Generalized anxiety disorder ?Follows at [facility] for care.  Home medications: Abilify 10 mg (patient-reported dose, 7.5 documented), gabapentin 300 mg 3 times daily, and hydroxyzine 25 mg 3 times daily as needed. ?- Continue home Abilify 7.5 mg and hydroxyzine 25 mg PRN anxiety ?- TOC consult for unhomed status; appreciate assistance. Outpatient LCSW provided patient with housing voucher. ?  ?Hypertension ?Patient non-adherent with home medications. Improved with HD and home losartan.  ?-- Continue home Losartan 50 mg daily ?-- Monitor daily vitals ? ?Prior to Admission Living Arrangement: Unhomed, awaiting housing at [employer]. ?Anticipated Discharge Location: Pending ?Barriers to Discharge: Unhomed status, patient not participating with PT/OT  ?Dispo: To be decided.  ? [name], MD ?02/13/2022, 7:39 AM ?Pager: [phone] ?After 5pm on weekdays and 1pm on weekends: On Call pager [phone]  ?

## 2022-02-13 NOTE — Progress Notes (Signed)
Pt. refused scheduled heparin. ?

## 2022-02-13 NOTE — TOC Progression Note (Signed)
Transition of Care (TOC) - Initial/Assessment Note  ? ? ?Patient Details  ?Name: Natasha Long ?MRN: [id number] ?Date of Birth: 12-07-1995 ? ?Transition of Care (TOC) CM/SW Contact:    ?Paulene Floor Mariyah Upshaw, LCSWA ?Phone Number: ?02/13/2022, 3:53 PM ? ?Clinical Narrative:                 ?Late Entry from 3/16-    ? ?CSW received a call from [facility].  [phone].  The patient has been approved for housing and has to go through orientation and the agency will find housing.  CSW asked if housing would be secured prior to the patient discharging.  CSW was informed that it is a process and could take a few weeks.  The patient has been informed of this. ? ?CSW spoke with admissions at [facility].  The facility may be able to accept if the patient has a "secure and definite" way to get to dialysis on TTS as the facility cannot provide.  CSW attempted to speak with the patient about this but the patient was lethargic and would not wake long enough to speak with CSW. ? ?The facility can possible accept on Monday if a definite plan for transportation to dialysis is in place. ? ? ?  ?Barriers to Discharge: Continued Medical Work up, Homeless with medical needs ? ? ?Patient Goals and CMS Choice ?  ?  ?  ? ?Expected Discharge Plan and Services ?  ?  ?  ?  ?Living arrangements for the past 2 months: Homeless ?                ?  ?  ?  ?  ?  ?  ?  ?  ?  ?  ? ?Prior Living Arrangements/Services ?Living arrangements for the past 2 months: Homeless ?Lives with:: Other (Comment) ?Patient language and need for interpreter reviewed:: Yes ?       ?Need for Family Participation in Patient Care: Yes (Comment) ?Care giver support system in place?: No (comment) ?  ?Criminal Activity/Legal Involvement Pertinent to Current Situation/Hospitalization: No - Comment as needed ? ?Activities of Daily Living ?Home Assistive Devices/Equipment: None ?ADL Screening (condition at time of admission) ?Patient's cognitive ability adequate to  safely complete daily activities?: Yes ?Is the patient deaf or have difficulty hearing?: No ?Does the patient have difficulty seeing, even when wearing glasses/contacts?: No ?Does the patient have difficulty concentrating, remembering, or making decisions?: No ?Patient able to express need for assistance with ADLs?: Yes ?Does the patient have difficulty dressing or bathing?: No ?Independently performs ADLs?: Yes (appropriate for developmental age) ?Does the patient have difficulty walking or climbing stairs?: No ?Weakness of Legs: Right ?Weakness of Arms/Hands: None ? ?Permission Sought/Granted ?  ?Permission granted to share information with : Yes, Verbal Permission Granted ?   ? Permission granted to share info w AGENCY: Facilities if physical therapy recommends ?   ?   ? ?Emotional Assessment ?Appearance:: Appears older than stated age ?Attitude/Demeanor/Rapport: Engaged ?Affect (typically observed): Appropriate ?Orientation: : Oriented to Situation, Oriented to  Time, Oriented to Place, Oriented to Self ?Alcohol / Substance Use: Illicit Drugs ?Psych Involvement: No (comment) ? ?Admission diagnosis:  End stage renal disease ([place]) [N18.6] ?Dialysis patient, noncompliant ([place]) [Z91.15] ?Effusion of right knee [M25.461] ?Knee pain, acute [M25.569] ?Osteoarthritis of right knee, unspecified osteoarthritis type [M17.11] ?Patient Active Problem List  ? Diagnosis Date Noted  ? Syphilis   ? Pain management   ? Septic arthritis of multiple joints (  [place]) 02/01/2022  ? Group G streptococcal infection 02/01/2022  ? Left wrist pain   ? Hyperkalemia 01/30/2022  ? Effusion of right knee   ? Arthritis, septic ([place])   ? Dialysis patient, noncompliant ([place])   ? Knee pain, acute 01/29/2022  ? Hypokalemia 01/29/2022  ? Hyperphosphatemia 01/29/2022  ? Chest pain   ? Hypervolemia associated with renal insufficiency 12/23/2021  ? Elevated troponin 12/23/2021  ? Tobacco use 08/11/2021  ? Nipple discharge 08/11/2021  ? Breast pain  08/11/2021  ? Exposure to sexually transmitted disease (STD) 08/11/2021  ? Complication of vascular dialysis catheter 02/12/2021  ? Moderate protein-calorie malnutrition ([place]) 01/27/2021  ? Allergy, unspecified, initial encounter 01/23/2021  ? Anemia in chronic kidney disease 01/23/2021  ? End stage renal disease ([place]) 01/23/2021  ? Iron deficiency anemia, unspecified 01/23/2021  ? Other specified coagulation defects ([place]) 01/23/2021  ? Secondary hyperparathyroidism of renal origin ([place]) 01/23/2021  ? Anemia   ? Suicidal ideations   ? Influenza vaccine refused 12/19/2020  ? Morbid obesity ([place]) 01/30/2020  ? 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 Cocaine use, 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[place]) 01/30/2020  ? Affective psychosis, bipolar ([place]) 06/27/2019  ? Borderline personality disorder ([place]) 11/16/2018  ? Moderate cannabis use disorder ([place]) 11/16/2018  ? Severe recurrent major depression without psychotic features ([place]) 11/15/2018  ? Chronic hypertension 04/04/2018  ? PTSD (post-traumatic stress disorder) 04/01/2018  ? ?PCP:  [name], MD ?Pharmacy:   ?[facility] [place], [place] - [address] ?[address] ?[place] [address] ?Phone: [phone] Fax: [phone] ? ?[facility] ?[address] ?[place] [address] ?Phone: [phone] Fax: [phone] ? ? ? ? ?Social Determinants of Health (SDOH) Interventions ?  ? ?Readmission Risk Interventions ?No flowsheet data found. ? ? ?

## 2022-02-14 LAB — RENAL FUNCTION PANEL
Albumin: 1.9 g/dL — ABNORMAL LOW (ref 3.5–5.0)
Anion gap: 18 — ABNORMAL HIGH (ref 5–15)
BUN: 87 mg/dL — ABNORMAL HIGH (ref 6–20)
CO2: 21 mmol/L — ABNORMAL LOW (ref 22–32)
Calcium: 9.5 mg/dL (ref 8.9–10.3)
Chloride: 90 mmol/L — ABNORMAL LOW (ref 98–111)
Creatinine, Ser: 12.9 mg/dL — ABNORMAL HIGH (ref 0.44–1.00)
GFR, Estimated: 4 mL/min — ABNORMAL LOW (ref >60)
Glucose, Bld: 93 mg/dL (ref 70–99)
Phosphorus: 9.9 mg/dL — ABNORMAL HIGH (ref 2.5–4.6)
Potassium: 5.2 mmol/L — ABNORMAL HIGH (ref 3.5–5.1)
Sodium: 129 mmol/L — ABNORMAL LOW (ref 135–145)

## 2022-02-14 LAB — CBC
HCT: 23 % — ABNORMAL LOW (ref 36.0–46.0)
Hemoglobin: 7.2 g/dL — ABNORMAL LOW (ref 12.0–15.0)
MCH: 28.2 pg (ref 26.0–34.0)
MCHC: 31.3 g/dL (ref 30.0–36.0)
MCV: 90.2 fL (ref 80.0–100.0)
Platelets: 374 10*3/uL (ref 150–400)
RBC: 2.55 MIL/uL — ABNORMAL LOW (ref 3.87–5.11)
RDW: 15 % (ref 11.5–15.5)
WBC: 10 10*3/uL (ref 4.0–10.5)
nRBC: 0 % (ref 0.0–0.2)

## 2022-02-14 MED ORDER — SEVELAMER CARBONATE 800 MG PO TABS
1600.0000 mg | ORAL_TABLET | Freq: Three times a day (TID) | ORAL | Status: DC
  Administered 2022-02-15 – 2022-02-16 (×5): 1600 mg via ORAL
  Filled 2022-02-14 (×5): qty 2

## 2022-02-14 MED ORDER — HYDROMORPHONE HCL 2 MG PO TABS
1.0000 mg | ORAL_TABLET | Freq: Four times a day (QID) | ORAL | Status: DC | PRN
  Administered 2022-02-15 – 2022-02-16 (×6): 1 mg via ORAL
  Filled 2022-02-14 (×7): qty 1

## 2022-02-14 MED ORDER — HYDROMORPHONE HCL 2 MG PO TABS
2.0000 mg | ORAL_TABLET | Freq: Four times a day (QID) | ORAL | Status: AC
  Administered 2022-02-14 – 2022-02-15 (×3): 2 mg via ORAL
  Filled 2022-02-14 (×2): qty 1

## 2022-02-14 MED ORDER — HYDROMORPHONE HCL 2 MG PO TABS
2.0000 mg | ORAL_TABLET | ORAL | Status: DC
Start: 2022-02-14 — End: 2022-02-14
  Administered 2022-02-14: 2 mg via ORAL
  Filled 2022-02-14: qty 1

## 2022-02-14 NOTE — Procedures (Signed)
Pt became verbally abusive to our staff member Blase Mess RN.  Began screaming at her because she wanted to use the bathroom and not a bed pan during her hemodialysis treatment.  As Susanne Borders was attempting to explain that it would be unsafe to remove her from tx and allow her to use the bathroom with needles in her arm.  Ms. Litaker was disruptive for the entire unit flow.   ?

## 2022-02-14 NOTE — Progress Notes (Signed)
?[facility] ?Progress Note  ? ?Subjective:    ?Seen and examined patient at bedside. Patient more awake and pleasant. No complaints. Reports her pain is improving. Plan for HD today. ? ?Objective ?Vitals:  ? 02/13/22 0922 02/13/22 1633 02/13/22 2100 02/13/22 2119  ?BP: 124/69 133/80 135/84 (!) 141/79  ?Pulse: 92 94 98 (!) 101  ?Resp: 18 17 18    ?Temp: 98.6 ?F (37 ?C) 98.3 ?F (36.8 ?C) 98 ?F (36.7 ?C) 98 ?F (36.7 ?C)  ?TempSrc: Oral  Oral Oral  ?SpO2: 99% 100% 98% 100%  ?Weight:      ?Height:      ? ?Physical Exam ?General: Obese young female,NAD ?Heart: RRR no MRG ?Lungs: CTA, nonlabored breathing ?Abdomen: Obese NABS, NTND ?Extremities: Right knee dressing dry clean-trace edema RLE noted, none on left, left hand brace intact to forearm  ?Dialysis Access: + Bruit L UA AVF ? ?Filed Weights  ? 02/07/22 1630 02/12/22 1355 02/12/22 1647  ?Weight: 97.5 kg 97.2 kg 93.7 kg  ? ? ?Intake/Output Summary (Last 24 hours) at 02/14/2022 0821 ?Last data filed at 02/13/2022 1700 ?Gross per 24 hour  ?Intake 700 ml  ?Output --  ?Net 700 ml  ? ? ?Additional Objective ?Labs: ?Basic Metabolic Panel: ?Recent Labs  ?Lab 02/09/22 ?0707 02/10/22 ?0409 02/12/22 ?1420  ?NA 134* 133* 135  ?K 4.3 4.2 4.4  ?CL 92* 92* 92*  ?CO2 26 22 24   ?GLUCOSE 96 104* 98  ?BUN 60* 77* 70*  ?CREATININE 10.92* 13.13* 10.76*  ?CALCIUM 9.7 9.5 9.1  ?PHOS 10.6* >30.0* 9.4*  ? ?Liver Function Tests: ?Recent Labs  ?Lab 02/09/22 ?0707 02/10/22 ?0409 02/12/22 ?1420  ?ALBUMIN 1.8* 1.7* 1.8*  ? ?No results for input(s): LIPASE, AMYLASE in the last 168 hours. ?CBC: ?Recent Labs  ?Lab 02/07/22 ?1024 02/08/22 ?0116 02/09/22 ?0707 02/11/22 ?1013 02/12/22 ?1420  ?WBC 10.9* 10.7* 12.3* 9.5 6.6  ?HGB 8.9* 9.0* 8.8* 8.4* 7.9*  ?HCT 28.6* 29.2* 28.5* 27.4* 25.5*  ?MCV 88.8 89.8 90.5 90.1 90.4  ?PLT 358 323 383 373 392  ? ?Blood Culture ?   ?Component Value Date/Time  ? SDES WOUND 01/30/2022 1249  ? SPECREQUEST LEFT WRIST SYNOVIAL FLUID SPEC B 01/30/2022 1249  ?  CULT  01/30/2022 1249  ?  MODERATE STREPTOCOCCUS GROUP G ?Beta hemolytic streptococci are predictably susceptible to penicillin and other beta lactams. Susceptibility testing not routinely performed. ?NO ANAEROBES ISOLATED ?Performed at [facility], [place] [address], [place], [place] [address] ?  ? REPTSTATUS 02/04/2022 FINAL 01/30/2022 1249  ? ? ?Cardiac Enzymes: ?No results for input(s): CKTOTAL, CKMB, CKMBINDEX, TROPONINI in the last 168 hours. ?CBG: ?No results for input(s): GLUCAP in the last 168 hours. ?Iron Studies: No results for input(s): IRON, TIBC, TRANSFERRIN, FERRITIN in the last 72 hours. ?Lab Results  ?Component Value Date  ? INR 1.4 (H) 01/30/2022  ? INR 1.0 08/17/2019  ? ?Studies/Results: ?No results found. ? ?Medications: ? sodium chloride 10 mL/hr at 01/30/22 1700  ? sodium chloride    ? sodium chloride    ?  ceFAZolin (ANCEF) IV Stopped (02/12/22 1800)  ? ? acetaminophen  1,000 mg Oral Q8H  ? ARIPiprazole  7.5 mg Oral Daily  ? Chlorhexidine Gluconate Cloth  6 each Topical Q0600  ? darbepoetin (ARANESP) injection - DIALYSIS  100 mcg Intravenous Q Sat-HD  ? gabapentin  300 mg Oral QHS  ? heparin injection (subcutaneous)  5,000 Units Subcutaneous Q8H  ? hydrocerin  1 application. Topical BID  ?  HYDROmorphone  2 mg Oral Q4H  ? losartan  50 mg Oral Daily  ? multivitamin  1 tablet Oral Daily  ? pantoprazole  40 mg Oral Daily  ? polyethylene glycol  17 g Oral BID  ? ramelteon  8 mg Oral QHS  ? senna-docusate  2 tablet Oral BID  ? sucroferric oxyhydroxide  1,000 mg Oral TID WC  ? ? ?Dialysis Orders: ?[facility], TTS, 3.5 hs,BFR 350 / Auto flow DFR ?2K, 2.5 CA EDW 110kg (will need new lower EDW) ?Heparin 5000 units ?Mircera 100 mcg last given 11/20/01 ?Calcitriol 1.75 mcg p.o. q. HD  ?Venofer 50 MG q. weekly  ?LUA AVF ? ?Assessment/Plan: ?Altered mental status with severe uremia/right knee infection (admit BUN-158 cr 30) 2/2 missed OP HD ,last op HD 01/08/2022.  Has received some serial Hds in hospital-  mental status improved.  ?ESRD -T,Th,S. Has been off schedule D/T staffing and census issues. HD 3/14 with 1.4L net UF. Only tolerated around 900cc net UF with yesterday's HD-according to post-HD notes, pt c/o cramping and feeling of passing out causing to end tx early. Plan for HD today with UF as tolerated. ?Hypertension/volume-BP variable. Resetting EDW. UF as tolerated.  ?Right knee pain //infection /status post knee irrigation and debridement 3/04. R knee cx (+) group g streptococcus. WBC is improved to 6.6 this AM. Blood cx (-) so far. Ortho and ID have seen on cefazolin, noted some patient noncompliance with PT and Ortho signed off on 3/13 with "refused physical therapy and CPM multiple times" ? Left wrist infection status post I&D by hand surgeon 3/03. Reviewed cultures: (+) Group G streptococcus ?Anemia  Hgb trending down-now 7.9 =3/15 s/p 3 units PRBCs this admit.Aranesp 100 mcg-dose due tomorrow with HD, will raise dose. Avoid iron with infection picture ?Metabolic bone disease -Corrected Ca+ > 10.  PO4 was >30, now trending down but still high-9.4. On Velphoro binders noted has refused some in hospital, she reports Velphoro taste bad. Will switch binder to Renvela 2 tabs with meals , 2.0 Ca+ bath. Hold VDRA.  ?Nutrition -albumin 1.8 protein supplements , Renal diet. Fluid restrictions.  ?History of borderline personality disorder, major depressive order, generalized anxiety/last reported homeless- plan per admit team ?Dispo-needing SNF, patient expresses the reasoning for frequent missed HD treatments was d/t transportation issues. She reports currently not having a place to live.  Noted per admit team awaiting housing at [employer] ? ?Tobie Poet, NP ?[facility] ?02/14/2022,8:21 AM ? LOS: 16 days  ?  ?

## 2022-02-14 NOTE — Progress Notes (Signed)
? ?Subjective: Natasha Long ? ?Patient seen at bedside today, awake and alert. She is pleasant today, and reports no acute distress. She reports improvements to itching of her back and chest. She also reports sleeping better last night. ? ?She was informed that we are awaiting STI results to guide treatment, and she voiced understanding. She looks forward to discharging to SNF. ? ? ?Objective: ? ?Vital signs in last 24 hours: ?Vitals:  ? 02/13/22 0922 02/13/22 1633 02/13/22 2100 02/13/22 2119  ?BP: 124/69 133/80 135/84 (!) 141/79  ?Pulse: 92 94 98 (!) 101  ?Resp: 18 17 18    ?Temp: 98.6 ?F (37 ?C) 98.3 ?F (36.8 ?C) 98 ?F (36.7 ?C) 98 ?F (36.7 ?C)  ?TempSrc: Oral  Oral Oral  ?SpO2: 99% 100% 98% 100%  ?Weight:      ?Height:      ? ?Physical Exam ?General: young female, lying in bed, NAD, alert ?Pulm: normal work of breathing on RA. ?MSK: RLE with motion as patient adjusted herself in bed, sitting up. No pain elicited. LUE still with limited mobility in brace, but improvements in pain when hit against the bed.  ?Skin: Back with significant excoriations (from patient scratching) and peeling skin. Similar appearance of skin around umbilicus. Maybe secondary syphilis. ?Neuro: A&O x 3, no focal deficits noted. ?Psych:Upbeat mood and affect ? ?Assessment/Plan: ? ?Principal Problem: ?  Septic arthritis of multiple joints ([place]) ?Active Problems: ?  End stage renal disease ([place]) ?  Knee pain, acute ?  Hyperphosphatemia ?  Effusion of right knee ?  Hyperkalemia ?  Arthritis, septic ([place]) ?  Dialysis patient, noncompliant ([place]) ?  Left wrist pain ?  Group G streptococcal infection ?  Pain management ?  Syphilis ? ?Natasha Long is a 26 year old female with a history of nephrotic syndrome on hemodialysis who has been noncompliant with sessions for the past 2.5 weeks, borderline personality disorder, and recent homelessness who presented with traumatic knee injury after falling down a flight of stairs, found to have polyarticular septic  arthritis, who has been improving drastically clinically with treatment and compliance with PT. ? ?Acute traumatic knee injury ?Multifocal septic arthritis ?Patient remains afebrile. CBC showing WBC 9.5. Postop day 13 s/p I&D of right knee and left wrist by ortho, appreciate assistance. Cultures showing group G strep. Patient on cefazolin x6 weeks per ID, appreciate assistance. She is WBAT per ortho, and is now amenable to working with PT after their initial sign-off; continued recommendation is SNF. Biggest issue during stay has been pain control. Pain medication regimen has been adjusted to meet patient's needs while attempting to reduce opioid burden. Has been transitioned off of PCA dilaudid pump IV dilaudid. Pain control will be primarily with oral medications and Voltaren gel moving forward. CRP downtrending appropriately.  ?-IV cefazolin with HD x6 weeks (end date approximately 4/15) ?-tylenol q8 scheduled ?-PO dilaudid decreased to 2 mg q6h; will decrease to 1 mg q6h PRN tomorrow ?-PO Gabapentin 300 mg qHS ?-Voltaren gel 4g QID PRN ? ?Suspected STI ?Patient reports ongoing yellowish vaginal discharge but denies genital lesions or pain. Recent hx of Gonorrhea 12/27/21, and unclear if patient completed treatment.  ?- STI panel ordered: RPR reactive (T. Pallidum total Ab pending), HIV NR, Cervicovaginal ancillary collected and pending. ? ?ESRD with HD noncompliance ?Hyperphosphatemia 2/2 missed HD ?Hyperkalemia 2/2 missed HD, resolved  ?Patient reports history of nephrotic syndrome.  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  Patient reports noncompliance with hemodialysis due to being unhomed. Multiple presentations at multiple ED's  after missing HD. ?-- appreciate nephrology assistance ?- patient back to TTS schedule ? ?Anemia of ESRD ?Acute blood loss anemia ?Status post 3 units PRBCs.  She remains asymptomatic from this. Hemoglobin has remained stable. ?-Aranesp 100 mg with HD per nephro ?-Trend CBC, transfuse if  hemoglobin less than 7 ?  ?History of borderline personality disorder ?Major depressive disorder ?Generalized anxiety disorder ?Follows at [facility] for care.  Home medications: Abilify 10 mg (patient-reported dose, 7.5 documented), gabapentin 300 mg 3 times daily, and hydroxyzine 25 mg 3 times daily as needed. ?- Continue home Abilify 7.5 mg and hydroxyzine 25 mg PRN anxiety ?- TOC consult for unhomed status; appreciate assistance. Outpatient LCSW provided patient with housing voucher. ?  ?Hypertension ?Patient non-adherent with home medications. Improved with HD and home losartan.  ?-- Continue home Losartan 50 mg daily ?-- Monitor daily vitals ? ?Prior to Admission Living Arrangement: Unhomed, awaiting housing at [employer]. ?Anticipated Discharge Location: SNF pending confirmation of transportation to HD ?Barriers to Discharge: STI workup and treatment ?Dispo: To be decided.  ? Natasha Schlatter, MD ?02/14/2022, 6:40 AM ?Pager: [phone] ?After 5pm on weekdays and 1pm on weekends: On Call pager [phone]  ?

## 2022-02-14 NOTE — Procedures (Signed)
Attempting to educate Ms. Asfaw on the policy of Not removing pts from treatment nor allowing them to go to the bathroom with needles in their arm, Ms Tagliaferri again was disruptive and began raising her voice.  Attempts made to de-escalate the situation, however Ms Scotti continued to argue and become loud.  Lin MD notified of our issues and orders were received to discontinue hemodialysis and send pt back to her room.  He will attempt to discuss the expectations with her when he returns tomorrow.  ?

## 2022-02-15 DIAGNOSIS — A539 Syphilis, unspecified: Secondary | ICD-10-CM | POA: Diagnosis not present

## 2022-02-15 DIAGNOSIS — N186 End stage renal disease: Secondary | ICD-10-CM | POA: Diagnosis not present

## 2022-02-15 DIAGNOSIS — M0029 Other streptococcal polyarthritis: Secondary | ICD-10-CM | POA: Diagnosis not present

## 2022-02-15 LAB — CBC
HCT: 21.7 % — ABNORMAL LOW (ref 36.0–46.0)
Hemoglobin: 6.9 g/dL — CL (ref 12.0–15.0)
MCH: 28.5 pg (ref 26.0–34.0)
MCHC: 31.8 g/dL (ref 30.0–36.0)
MCV: 89.7 fL (ref 80.0–100.0)
Platelets: 339 10*3/uL (ref 150–400)
RBC: 2.42 MIL/uL — ABNORMAL LOW (ref 3.87–5.11)
RDW: 15.1 % (ref 11.5–15.5)
WBC: 8.2 10*3/uL (ref 4.0–10.5)
nRBC: 0 % (ref 0.0–0.2)

## 2022-02-15 LAB — C-REACTIVE PROTEIN: CRP: 21.9 mg/dL — ABNORMAL HIGH

## 2022-02-15 LAB — PREPARE RBC (CROSSMATCH)

## 2022-02-15 LAB — HEMOGLOBIN AND HEMATOCRIT, BLOOD
HCT: 24.8 % — ABNORMAL LOW (ref 36.0–46.0)
Hemoglobin: 8 g/dL — ABNORMAL LOW (ref 12.0–15.0)

## 2022-02-15 MED ORDER — SODIUM CHLORIDE 0.9% IV SOLUTION: Freq: Once | INTRAVENOUS | Status: DC

## 2022-02-15 MED ORDER — ACETAMINOPHEN 325 MG PO TABS: 650.0000 mg | ORAL_TABLET | ORAL | Status: DC

## 2022-02-15 MED ORDER — ACETAMINOPHEN 500 MG PO TABS
1000.0000 mg | ORAL_TABLET | Freq: Three times a day (TID) | ORAL | Status: DC
  Administered 2022-02-15 – 2022-02-16 (×3): 1000 mg via ORAL
  Filled 2022-02-15 (×3): qty 2

## 2022-02-15 MED ORDER — LIDOCAINE 5 % EX PTCH: 1.0000 | MEDICATED_PATCH | CUTANEOUS | Status: DC

## 2022-02-15 MED ORDER — METHOCARBAMOL 500 MG PO TABS
1000.0000 mg | ORAL_TABLET | Freq: Every day | ORAL | Status: DC
  Administered 2022-02-15: 1000 mg via ORAL
  Filled 2022-02-15: qty 2

## 2022-02-15 MED ORDER — CEFAZOLIN SODIUM-DEXTROSE 2-4 GM/100ML-% IV SOLN
2.0000 g | Freq: Once | INTRAVENOUS | Status: AC
  Administered 2022-02-15: 2 g via INTRAVENOUS
  Filled 2022-02-15: qty 100

## 2022-02-15 MED ORDER — DARBEPOETIN ALFA 150 MCG/0.3ML IJ SOSY: 150.0000 ug | PREFILLED_SYRINGE | INTRAMUSCULAR | Status: DC

## 2022-02-15 NOTE — Progress Notes (Signed)
? ?Subjective: NAEON ? ?Patient seen at bedside today, awake and alert. She is pleasant today, and reports some pain but no acute distress.  We discussed the incident with dialysis and next steps for placement upon discharge ? ?She was informed that we are still awaiting STI results to guide treatment, and she voiced understanding.   ? ?Patient denies lightheadedness, dizziness, chest pain, dyspnea, and other symptoms of acute blood loss. ? ? ?Objective: ? ?Vital signs in last 24 hours: ?Vitals:  ? 02/14/22 1800 02/14/22 1852 02/14/22 2154 02/15/22 0606  ?BP: 108/60 129/60 124/71 139/77  ?Pulse:  (!) 104 97 97  ?Resp: 20 18 18 18   ?Temp: 98.6 ?F (37 ?C) 98.5 ?F (36.9 ?C) 98.4 ?F (36.9 ?C) (!) 97.5 ?F (36.4 ?C)  ?TempSrc:  Oral Oral Oral  ?SpO2: 99% 100% 100% 100%  ?Weight:      ?Height:      ? ?Physical Exam ?General: young female, lying in bed, NAD, alert ?Pulm: normal work of breathing on RA. ?MSK: RLE with motion as patient adjusted herself in bed, sitting up. No pain elicited. LUE still with limited mobility in brace, but improvements in pain when hit against the bed.  ?Skin: Back with improvements to excoriations (from patient scratching) and peeling skin with use of Eucerin cream. Similar appearance of skin around umbilicus. Maybe secondary syphilis. ?Neuro: A&O x 3, no focal deficits noted. ?Psych: Normal mood and affect ? ?Assessment/Plan: ? ?Principal Problem: ?  Septic arthritis of multiple joints ([place]) ?Active Problems: ?  End stage renal disease ([place]) ?  Knee pain, acute ?  Hyperphosphatemia ?  Effusion of right knee ?  Hyperkalemia ?  Arthritis, septic ([place]) ?  Dialysis patient, noncompliant ([place]) ?  Left wrist pain ?  Group G streptococcal infection ?  Pain management ?  Syphilis ? ?Natasha Long is a 26 year old female with a history of nephrotic syndrome on hemodialysis who has been noncompliant with sessions for the past 2.5 weeks, borderline personality disorder, and recent homelessness who presented  with traumatic knee injury after falling down a flight of stairs, found to have polyarticular septic arthritis, who has been improving drastically clinically with treatment and compliance with PT. ? ?Acute traumatic knee injury ?Multifocal septic arthritis ?Patient remains afebrile. CBC showing WBC 9.5. Postop day 13 s/p I&D of right knee and left wrist by ortho, appreciate assistance. Cultures showing group G strep. Patient on cefazolin x6 weeks per ID, appreciate assistance. She is WBAT per ortho, and is now amenable to working with PT after their initial sign-off; continued recommendation is SNF. Biggest issue during stay has been pain control. Pain medication regimen has been adjusted to meet patient's needs while attempting to reduce opioid burden. Has been transitioned off of PCA dilaudid pump IV dilaudid. Pain control will be primarily with oral medications and Voltaren gel moving forward. CRP downtrending appropriately.  ?-IV cefazolin with HD x6 weeks (end date approximately 4/15) ?-Continue tylenol q8 scheduled ?-Initiate Robaxin 1000 mg nightly ?-PO dilaudid decreased to 1 mg q6h PRN  ?-Continue PO Gabapentin 300 mg qHS ?-Voltaren gel 4g QID PRN ? ?Suspected STI ?Patient reports ongoing yellowish vaginal discharge but denies genital lesions or pain. Recent hx of Gonorrhea 12/27/21, and unclear if patient completed treatment.  ?- STI panel ordered: RPR reactive (T. Pallidum total Ab pending), HIV NR, Cervicovaginal ancillary collected and pending. ? ?ESRD with HD noncompliance ?Hyperphosphatemia 2/2 missed HD ?Hyperkalemia 2/2 missed HD, resolved  ?Patient reports history of nephrotic syndrome.  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  Patient reports noncompliance with hemodialysis due to being unhomed. Multiple presentations at multiple ED's after missing HD. ?-- appreciate nephrology assistance ?- patient back to TTS schedule ? ?Anemia of ESRD ?Acute blood loss anemia ?Status post 3 units PRBCs.  She remains  asymptomatic from this. Hemoglobin has remained stable. ?-Aranesp 100 mg with HD per nephro ?-Trend CBC, transfuse if hemoglobin less than 7 ?  ?History of borderline personality disorder ?Major depressive disorder ?Generalized anxiety disorder ?Follows at [facility] for care.  Home medications: Abilify 10 mg (patient-reported dose, 7.5 documented), gabapentin 300 mg 3 times daily, and hydroxyzine 25 mg 3 times daily as needed. ?- Continue home Abilify 7.5 mg and hydroxyzine 25 mg PRN anxiety ?-Continue ramelteon 8 mg nightly for insomnia ?- TOC consult for unhomed status; appreciate assistance. Outpatient LCSW provided patient with housing voucher. ?  ?Hypertension ?Patient non-adherent with home medications. Improved with HD and home losartan.  ?-- Continue home Losartan 50 mg daily ?-- Monitor daily vitals ? ?Prior to Admission Living Arrangement: Unhomed, awaiting housing at [employer]. ?Anticipated Discharge Location: SNF pending confirmation of transportation to HD ?Barriers to Discharge: STI workup and treatment ?Dispo: To be decided.  ? Natasha Schlatter, MD ?02/15/2022, 6:37 AM ?Pager: [phone] ?After 5pm on weekdays and 1pm on weekends: On Call pager [phone]  ?

## 2022-02-15 NOTE — Progress Notes (Addendum)
?[facility] ?Progress Note  ? ?Subjective:    ?Seen and examined patient at bedside. Noted patient being disruptive during yesterday's HD causing her to sign off early. Tolerated net UF 1L. Notified by bedside RN of Hgb 6.9-plan to transfuse 1 unit PRBCs this AM.  ? ?Objective ?Vitals:  ? 02/14/22 1800 02/14/22 1852 02/14/22 2154 02/15/22 0606  ?BP: 108/60 129/60 124/71 139/77  ?Pulse:  (!) 104 97 97  ?Resp: 20 18 18 18   ?Temp: 98.6 ?F (37 ?C) 98.5 ?F (36.9 ?C) 98.4 ?F (36.9 ?C) (!) 97.5 ?F (36.4 ?C)  ?TempSrc:  Oral Oral Oral  ?SpO2: 99% 100% 100% 100%  ?Weight:      ?Height:      ? ?Physical Exam ?General: Obese young female,NAD ?Heart: RRR no MRG ?Lungs: CTA, nonlabored breathing ?Abdomen: Obese NABS, NTND ?Extremities: Right knee dressing dry clean-trace R pedal edema noted, none on left, left hand brace intact to forearm  ?Dialysis Access: + [place] L UA AVF ? ?Filed Weights  ? 02/12/22 1355 02/12/22 1647 02/14/22 1510  ?Weight: 97.2 kg 93.7 kg 93 kg  ? ? ?Intake/Output Summary (Last 24 hours) at 02/15/2022 0749 ?Last data filed at 02/15/2022 0600 ?Gross per 24 hour  ?Intake 1000 ml  ?Output 1000 ml  ?Net 0 ml  ? ? ?Additional Objective ?Labs: ?Basic Metabolic Panel: ?Recent Labs  ?Lab 02/10/22 ?0409 02/12/22 ?1420 02/14/22 ?1443  ?NA 133* 135 129*  ?K 4.2 4.4 5.2*  ?CL 92* 92* 90*  ?CO2 22 24 21*  ?GLUCOSE 104* 98 93  ?BUN 77* 70* 87*  ?CREATININE 13.13* 10.76* 12.90*  ?CALCIUM 9.5 9.1 9.5  ?PHOS >30.0* 9.4* 9.9*  ? ?Liver Function Tests: ?Recent Labs  ?Lab 02/10/22 ?0409 02/12/22 ?1420 02/14/22 ?1443  ?ALBUMIN 1.7* 1.8* 1.9*  ? ?No results for input(s): LIPASE, AMYLASE in the last 168 hours. ?CBC: ?Recent Labs  ?Lab 02/09/22 ?0707 02/11/22 ?1013 02/12/22 ?1420 02/14/22 ?1441 02/15/22 ?0659  ?WBC 12.3* 9.5 6.6 10.0 8.2  ?HGB 8.8* 8.4* 7.9* 7.2* 6.9*  ?HCT 28.5* 27.4* 25.5* 23.0* 21.7*  ?MCV 90.5 90.1 90.4 90.2 89.7  ?PLT 383 373 392 374 339  ? ?Blood Culture ?   ?Component Value Date/Time  ? SDES  WOUND 01/30/2022 1249  ? SPECREQUEST LEFT WRIST SYNOVIAL FLUID SPEC B 01/30/2022 1249  ? CULT  01/30/2022 1249  ?  MODERATE STREPTOCOCCUS GROUP G ?Beta hemolytic streptococci are predictably susceptible to penicillin and other beta lactams. Susceptibility testing not routinely performed. ?NO ANAEROBES ISOLATED ?Performed at [facility], [place] [address] [place], [place] [address] ?  ? REPTSTATUS 02/04/2022 FINAL 01/30/2022 1249  ? ? ?Cardiac Enzymes: ?No results for input(s): CKTOTAL, CKMB, CKMBINDEX, TROPONINI in the last 168 hours. ?CBG: ?No results for input(s): GLUCAP in the last 168 hours. ?Iron Studies: No results for input(s): IRON, TIBC, TRANSFERRIN, FERRITIN in the last 72 hours. ?Lab Results  ?Component Value Date  ? INR 1.4 (H) 01/30/2022  ? INR 1.0 08/17/2019  ? ?Studies/Results: ?No results found. ? ?Medications: ? sodium chloride 10 mL/hr at 01/30/22 1700  ?  ceFAZolin (ANCEF) IV Stopped (02/12/22 1800)  ?  ceFAZolin (ANCEF) IV    ? ? acetaminophen  1,000 mg Oral Q8H  ? ARIPiprazole  7.5 mg Oral Daily  ? Chlorhexidine Gluconate Cloth  6 each Topical Q0600  ? darbepoetin (ARANESP) injection - DIALYSIS  100 mcg Intravenous Q Sat-HD  ? gabapentin  300 mg Oral QHS  ? heparin injection (subcutaneous)  5,000 Units Subcutaneous Q8H  ? hydrocerin  1 application. Topical BID  ? losartan  50 mg Oral Daily  ? multivitamin  1 tablet Oral Daily  ? pantoprazole  40 mg Oral Daily  ? polyethylene glycol  17 g Oral BID  ? ramelteon  8 mg Oral QHS  ? senna-docusate  2 tablet Oral BID  ? sevelamer carbonate  1,600 mg Oral TID WC  ? ? ?Dialysis Orders: ?[facility], TTS, 3.5 hs,BFR 350 / Auto flow DFR ?2K, 2.5 CA EDW 110kg (will need new lower EDW) ?Heparin 5000 units ?Mircera 100 mcg last given 11/20/01 ?Calcitriol 1.75 mcg p.o. q. HD  ?Venofer 50 MG q. weekly  ?LUA AVF ? ?Assessment/Plan: ?Altered mental status with severe uremia/right knee infection (admit BUN-158 cr 30) 2/2 missed OP HD ,last op HD 01/08/2022.  Has  received some serial Hds in hospital- mental status improved.  ?ESRD -T,Th,S. Has been off schedule D/T staffing and census issues. HD 3/18 net UF 1L-signed off early d/t disruptive behavior. I discussed with her the importance of remaining respectful to nursing staff members to promote overall safety. Plan for HD 3/21. ?Hypertension/volume-BP variable. Resetting EDW. UF as tolerated.  ?Right knee pain //infection /status post knee irrigation and debridement 3/04. R knee cx (+) group g streptococcus. WBC is improved to 6.6 this AM. Blood cx (-) so far. Ortho and ID have seen on cefazolin, noted some patient noncompliance with PT and Ortho signed off on 3/13 with "refused physical therapy and CPM multiple times" ? Left wrist infection status post I&D by hand surgeon 3/03. Reviewed cultures: (+) Group G streptococcus ?Anemia  S/p 3 units PRBCs so far this admit. Hgb now 6.9-will order 1 unit PRBC today. Aranesp 100 mcg-dose given 3/18 with HD, will raise dose. Avoid iron with infection picture ?Metabolic bone disease -Corrected Ca+ > 10.  PO4 was >30, now trending down but still high-9.4. On Velphoro binders noted has refused some in hospital, she reports Velphoro taste bad. Now on Renvela 2 tabs with meals , 2.0 Ca+ bath. Hold VDRA.  ?Nutrition -albumin 1.8 protein supplements , Renal diet. Fluid restrictions.  ?History of borderline personality disorder, major depressive order, generalized anxiety/last reported homeless- plan per admit team ?Dispo-needing SNF, patient expresses the reasoning for frequent missed HD treatments was d/t transportation issues. She reports currently not having a place to live.  Noted per admit team awaiting housing at [employer] ?  ?Tobie Poet, NP ?[facility] ?02/15/2022,7:49 AM ? LOS: 17 days  ?  ?

## 2022-02-15 NOTE — Progress Notes (Signed)
Pharmacy Antibiotic Note ? ?Natasha Long is a 26 y.o. female on day # 16 Cefazolin for Group G Strep septic arthritis. Planning 6 weeks of treatment, thru 03/14/22. ? ?  On Cefazolin 2gm IV after hemodialysis. Had been on MWF dosing scheduled while on off schedule HD MWF, back to TTS on 3/14 and timing adjusted. HD session 3/18 shortened to 2 hrs.  Cefazolin dose not charted on 3/18 pm. ? ?Plan: ?Cefazolin 2gm IV x 1 today, then continue TTS after HD. Next HD planned 3/21. ?Will follow HD schedule for any need to adjust timing of doses. ?Planning 6 weeks of therapy per ID.Marland Kitchen ? ?Height: 5\' 8"  (172.7 cm) ?Weight: 93 kg (205 lb 0.4 oz) ?IBW/kg (Calculated) : 63.9 ? ?Temp (24hrs), Avg:98.4 ?F (36.9 ?C), Min:97.5 ?F (36.4 ?C), Max:98.9 ?F (37.2 ?C) ? ?Recent Labs  ?Lab 02/09/22 ?0707 02/10/22 ?0409 02/11/22 ?1013 02/12/22 ?1420 02/14/22 ?1441 02/14/22 ?1443 02/15/22 ?0659  ?WBC 12.3*  --  9.5 6.6 10.0  --  8.2  ?CREATININE 10.92* 13.13*  --  10.76*  --  12.90*  --   ?  ?Estimated Creatinine Clearance: 7.8 mL/min (A) (by C-G formula based on SCr of 12.9 mg/dL (H)).   ? ?Allergies  ?Allergen Reactions  ? Prozac [Fluoxetine Hcl] Other (See Comments)  ?  Panic attack   ? Wellbutrin [Bupropion] Other (See Comments)  ?  Panic attack  ? Prednisone Other (See Comments)  ?  Pt states that this med caused pancreatitis.   ? ? ?Antimicrobials this admission: ?Vancomycin 3/3 >>3/5 ?Cefepime 3/3 >>3/3 ?Cefazolin 3/4>>(4/15) ? ?Microbiology results: ?3/3 Blood: negative  ?3/3 R Knee synovial fluid: group G strep, pan sens, R to clinda, erythro ?3/3 L wrist synovial fluid: group G strep ?3/16 RPR: reactive. ID following in [place] ?3/16 T pallidum Ab: in process ?3/16 HIV: non-reactive ? ?Thank you for allowing pharmacy to be a part of this patient?s care. ? ?Arty Baumgartner, [place] ?02/15/2022 10:31 AM ? ?

## 2022-02-16 ENCOUNTER — Other Ambulatory Visit (HOSPITAL_COMMUNITY): Payer: Self-pay

## 2022-02-16 DIAGNOSIS — R52 Pain, unspecified: Secondary | ICD-10-CM | POA: Diagnosis not present

## 2022-02-16 DIAGNOSIS — A539 Syphilis, unspecified: Secondary | ICD-10-CM | POA: Diagnosis not present

## 2022-02-16 DIAGNOSIS — M0029 Other streptococcal polyarthritis: Secondary | ICD-10-CM | POA: Diagnosis not present

## 2022-02-16 LAB — T.PALLIDUM AB, TOTAL: T Pallidum Abs: NONREACTIVE

## 2022-02-16 LAB — CERVICOVAGINAL ANCILLARY ONLY
Bacterial Vaginitis (gardnerella): NEGATIVE
Candida Glabrata: NEGATIVE
Candida Vaginitis: NEGATIVE
Chlamydia: NEGATIVE
Comment: NEGATIVE
Comment: NEGATIVE
Comment: NEGATIVE
Comment: NEGATIVE
Comment: NEGATIVE
Comment: NORMAL
Neisseria Gonorrhea: NEGATIVE
Trichomonas: POSITIVE — AB

## 2022-02-16 LAB — CBC
HCT: 25.7 % — ABNORMAL LOW (ref 36.0–46.0)
Hemoglobin: 8 g/dL — ABNORMAL LOW (ref 12.0–15.0)
MCH: 28.3 pg (ref 26.0–34.0)
MCHC: 31.1 g/dL (ref 30.0–36.0)
MCV: 90.8 fL (ref 80.0–100.0)
Platelets: 407 10*3/uL — ABNORMAL HIGH (ref 150–400)
RBC: 2.83 MIL/uL — ABNORMAL LOW (ref 3.87–5.11)
RDW: 15 % (ref 11.5–15.5)
WBC: 9.2 10*3/uL (ref 4.0–10.5)
nRBC: 0 % (ref 0.0–0.2)

## 2022-02-16 MED ORDER — POLYETHYLENE GLYCOL 3350 17 G PO PACK: 17.0000 g | PACK | Freq: Two times a day (BID) | ORAL | 0 refills | Status: DC

## 2022-02-16 MED ORDER — METHOCARBAMOL 500 MG PO TABS
1000.0000 mg | ORAL_TABLET | Freq: Every day | ORAL | 0 refills | Status: DC
  Filled 2022-02-16: qty 60, 30d supply, fill #0

## 2022-02-16 MED ORDER — RAMELTEON 8 MG PO TABS
8.0000 mg | ORAL_TABLET | Freq: Every day | ORAL | 0 refills | Status: DC
  Filled 2022-02-16: qty 30, 30d supply, fill #0

## 2022-02-16 MED ORDER — DICLOFENAC SODIUM 1 % EX GEL
4.0000 g | Freq: Four times a day (QID) | CUTANEOUS | 0 refills | Status: DC | PRN
  Filled 2022-02-16: qty 200, 15d supply, fill #0

## 2022-02-16 MED ORDER — METRONIDAZOLE 500 MG PO TABS: 500.0000 mg | ORAL_TABLET | Freq: Two times a day (BID) | ORAL | 0 refills | Status: AC

## 2022-02-16 MED ORDER — DIPHENHYDRAMINE-ZINC ACETATE 2-0.1 % EX CREA
1.0000 "application " | TOPICAL_CREAM | Freq: Two times a day (BID) | CUTANEOUS | 0 refills | Status: DC | PRN
  Filled 2022-02-16: qty 28, 14d supply, fill #0

## 2022-02-16 MED ORDER — HYDROCERIN EX CREA
1.0000 "application " | TOPICAL_CREAM | Freq: Two times a day (BID) | CUTANEOUS | 0 refills | Status: DC
  Filled 2022-02-16: qty 228, 29d supply, fill #0

## 2022-02-16 MED ORDER — PENICILLIN G BENZATHINE 1200000 UNIT/2ML IM SUSY
2.4000 10*6.[IU] | PREFILLED_SYRINGE | Freq: Once | INTRAMUSCULAR | Status: AC
  Administered 2022-02-16: 2.4 10*6.[IU] via INTRAMUSCULAR
  Filled 2022-02-16 (×2): qty 4

## 2022-02-16 MED ORDER — ACETAMINOPHEN 500 MG PO TABS
1000.0000 mg | ORAL_TABLET | Freq: Three times a day (TID) | ORAL | 0 refills | Status: DC
  Filled 2022-02-16: qty 30, 5d supply, fill #0

## 2022-02-16 MED ORDER — CEFAZOLIN SODIUM-DEXTROSE 2-4 GM/100ML-% IV SOLN
2.0000 g | INTRAVENOUS | 0 refills | Status: AC
  Filled 2022-02-16: qty 11, 25d supply, fill #0

## 2022-02-16 MED ORDER — SEVELAMER CARBONATE 800 MG PO TABS
1600.0000 mg | ORAL_TABLET | Freq: Three times a day (TID) | ORAL | 0 refills | Status: DC
  Filled 2022-02-16: qty 180, 30d supply, fill #0

## 2022-02-16 MED ORDER — DARBEPOETIN ALFA 150 MCG/0.3ML IJ SOSY: 150.0000 ug | PREFILLED_SYRINGE | INTRAMUSCULAR | Status: DC

## 2022-02-16 MED ORDER — GABAPENTIN 300 MG PO CAPS
300.0000 mg | ORAL_CAPSULE | Freq: Every day | ORAL | 0 refills | Status: DC
  Filled 2022-02-16: qty 30, 30d supply, fill #0

## 2022-02-16 MED ORDER — SENNOSIDES-DOCUSATE SODIUM 8.6-50 MG PO TABS
2.0000 | ORAL_TABLET | Freq: Two times a day (BID) | ORAL | 0 refills | Status: DC
  Filled 2022-02-16: qty 60, 15d supply, fill #0

## 2022-02-16 NOTE — Progress Notes (Signed)
DISCHARGE NOTE SNF ?Hulda Hinze to be discharged [facility] per MD order. Patient verbalized understanding. ? ?Skin clean, dry and intact without evidence of skin break down, no evidence of skin tears noted. IV catheter discontinued intact. Site without signs and symptoms of complications. Dressing and pressure applied. Pt denies pain at the site currently. No complaints noted. ? ?Patient free of linesand  drains.  1 cm by 0.5 cm right buttock stage one present ? ?Discharge packet assembled. An After Visit Summary (AVS) was printed and given to the EMS personnel. Patient escorted via stretcher and discharged to [facility] via ambulance. Report called to accepting facility; all questions and concerns addressed.  ? ?Berneta Levins, RN  ?

## 2022-02-16 NOTE — Progress Notes (Signed)
Called back got voice mail will call again ?

## 2022-02-16 NOTE — Progress Notes (Signed)
?[facility] ?Progress Note  ? ?Subjective: Being discharged today to SNF. Up in chair, waiting for transportation. No C/Os.  ? ?Objective ?Vitals:  ? 02/15/22 1755 02/15/22 1920 02/15/22 2129 02/16/22 0527  ?BP: 136/81 128/76 128/68 136/78  ?Pulse: 97 96 92 96  ?Resp: 17 18 16 19   ?Temp: 98.5 ?F (36.9 ?C) 98.5 ?F (36.9 ?C) 97.9 ?F (36.6 ?C) 98.9 ?F (37.2 ?C)  ?TempSrc: Oral Oral Oral Oral  ?SpO2: 100% 100% 100% 100%  ?Weight:      ?Height:      ? ?Physical Exam ?General:Younger female in NAD ?Heart: S1,S2 RRR No M/R/G ?Lungs:CTAB ?Abdomen: NABS, NT, ND ?Extremities: Trace RLE edema.   ?Dialysis Access: L AVF + T/B ?  ? ? ?Additional Objective ?Labs: ?Basic Metabolic Panel: ?Recent Labs  ?Lab 02/10/22 ?0409 02/12/22 ?1420 02/14/22 ?1443  ?NA 133* 135 129*  ?K 4.2 4.4 5.2*  ?CL 92* 92* 90*  ?CO2 22 24 21*  ?GLUCOSE 104* 98 93  ?BUN 77* 70* 87*  ?CREATININE 13.13* 10.76* 12.90*  ?CALCIUM 9.5 9.1 9.5  ?PHOS >30.0* 9.4* 9.9*  ? ?Liver Function Tests: ?Recent Labs  ?Lab 02/10/22 ?0409 02/12/22 ?1420 02/14/22 ?1443  ?ALBUMIN 1.7* 1.8* 1.9*  ? ?No results for input(s): LIPASE, AMYLASE in the last 168 hours. ?CBC: ?Recent Labs  ?Lab 02/11/22 ?1013 02/12/22 ?1420 02/14/22 ?1441 02/15/22 ?0659 02/15/22 ?2038 02/16/22 ?3557  ?WBC 9.5 6.6 10.0 8.2  --  9.2  ?HGB 8.4* 7.9* 7.2* 6.9* 8.0* 8.0*  ?HCT 27.4* 25.5* 23.0* 21.7* 24.8* 25.7*  ?MCV 90.1 90.4 90.2 89.7  --  90.8  ?PLT 373 392 374 339  --  407*  ? ?Blood Culture ?   ?Component Value Date/Time  ? SDES WOUND 01/30/2022 1249  ? SPECREQUEST LEFT WRIST SYNOVIAL FLUID SPEC B 01/30/2022 1249  ? CULT  01/30/2022 1249  ?  MODERATE STREPTOCOCCUS GROUP G ?Beta hemolytic streptococci are predictably susceptible to penicillin and other beta lactams. Susceptibility testing not routinely performed. ?NO ANAEROBES ISOLATED ?Performed at [facility], [place] [address] [place], [place] [address] ?  ? REPTSTATUS 02/04/2022 FINAL 01/30/2022 1249  ? ? ?Cardiac Enzymes: ?No  results for input(s): CKTOTAL, CKMB, CKMBINDEX, TROPONINI in the last 168 hours. ?CBG: ?No results for input(s): GLUCAP in the last 168 hours. ?Iron Studies: No results for input(s): IRON, TIBC, TRANSFERRIN, FERRITIN in the last 72 hours. ?@lablastinr3 @ ?Studies/Results: ?No results found. ?Medications: ? sodium chloride 10 mL/hr at 01/30/22 1700  ?  ceFAZolin (ANCEF) IV Stopped (02/12/22 1800)  ? ? sodium chloride   Intravenous Once  ? acetaminophen  1,000 mg Oral Q8H  ? ARIPiprazole  7.5 mg Oral Daily  ? Chlorhexidine Gluconate Cloth  6 each Topical Q0600  ? [START ON 02/21/2022] darbepoetin (ARANESP) injection - DIALYSIS  150 mcg Intravenous Q Sat-HD  ? gabapentin  300 mg Oral QHS  ? heparin injection (subcutaneous)  5,000 Units Subcutaneous Q8H  ? hydrocerin  1 application. Topical BID  ? losartan  50 mg Oral Daily  ? methocarbamol  1,000 mg Oral QHS  ? multivitamin  1 tablet Oral Daily  ? pantoprazole  40 mg Oral Daily  ? polyethylene glycol  17 g Oral BID  ? ramelteon  8 mg Oral QHS  ? senna-docusate  2 tablet Oral BID  ? sevelamer carbonate  1,600 mg Oral TID WC  ? ? ? ?Dialysis Orders: ?[facility], TTS, 3.5 hs,BFR 350 / Auto flow DFR ?2K, 2.5 CA EDW 110kg (will  need new lower EDW) ?Heparin 5000 units ?Mircera 100 mcg last given 11/20/01 ?Calcitriol 1.75 mcg p.o. q. HD  ?Venofer 50 MG q. weekly  ?LUA AVF ?  ?Assessment/Plan: ?Altered mental status with severe uremia/right knee infection (admit BUN-158 cr 30) 2/2 missed OP HD ,last op HD 01/08/2022.  Has received some serial Hds in hospital- mental status improved.  ?ESRD -T,Th,S. Has been off schedule D/T staffing and census issues. HD 3/18 net UF 1L-signed off early d/t disruptive behavior. Next HD 02/17/2022 at [facility] ?Hypertension/volume-BP variable. Resetting EDW. UF as tolerated.  ?Right knee pain //infection /status post knee irrigation and debridement 3/04. R knee cx (+) group g streptococcus. WBC is improved to 6.6 this AM. Blood cx (-) so far. Ortho and  ID have seen on cefazolin, noted some patient noncompliance with PT and Ortho signed off on 3/13 with "refused physical therapy and CPM multiple times" ? Left wrist infection status post I&D by hand surgeon 3/03. Reviewed cultures: (+) Group G streptococcus ?Anemia HGB 8.0 today.  S/p 4 units PRBCs so far this admit.  Aranesp 100 mcg-dose given 3/18 with HD, will raise dose. Avoid iron with infection picture ?Metabolic bone disease -Corrected Ca+ > 10.  PO4 was >30, now trending down but still high-9.4. On Velphoro binders noted has refused some in hospital, she reports Velphoro taste bad. Now on Renvela 2 tabs with meals , 2.0 Ca+ bath. Hold VDRA.  ?Nutrition -albumin 1.8 protein supplements , Renal diet. Fluid restrictions.  ?History of borderline personality disorder, major depressive order, generalized anxiety/last reported homeless- plan per admit team ?Dispo-needing SNF, patient expresses the reasoning for frequent missed HD treatments was d/t transportation issues. She reports currently not having a place to live.  Noted per admit team awaiting housing at [employer] ? ?Clifford Coudriet H. Jerine Surles NP-C ?02/16/2022, 2:15 PM  ?[facility] ?[phone] ? ? ?  ? ?

## 2022-02-16 NOTE — Plan of Care (Signed)
  Problem: Activity: Goal: Risk for activity intolerance will decrease Outcome: Progressing   Problem: Pain Managment: Goal: General experience of comfort will improve Outcome: Progressing   

## 2022-02-16 NOTE — Progress Notes (Signed)
Physical Therapy Treatment ?Patient Details ?Name: Natasha Long ?MRN: [id number] ?DOB: 1995-12-21 ?Today's Date: 02/16/2022 ? ? ?History of Present Illness 26 y.o. female presented to ED 01/28/22 for pain in right knee which occurred after she twisted it when she stumbled, after twisting her left ankle.  Right knee x-ray, CT right knee-degenerative joint present, knee effusion present, no fracture present. Needs dialysis after not presenting for dialysis x 2 weeks. 01/30/22 Rt knee aspiration and injection; 01/30/22 due to septic joint underwent Rt knee extensive synovectomy and later meniscal debridement; also developed left wrist pain and had arthrotomy for irrigation due to infection;    PMH nephrotic syndrome now on HD TThS, HTN, diastolic heart failure, borderline personality disorder, generalized anxiety, major depressive disorder, and recent homelessness ? ?  ?PT Comments  ? ? Session focused on getting out of bed and walking in the room and hallway. Pt was able to get to EOB independently and used a belt to bring RLE down from the bed safely to the ground. Pt brought left platform RW to her and used it to stand up. Pt was able to walk in the room and hallway with min guard assist for 15-20 minutes, including a standing break. Pt was able to maintain a conversation while walking, and did not present any apparent fatigue. Towards the end of walking, pt was feeling a bit dizzy and needed to sit down. Blood pressure was 138/79, and 120 pulse. Considering that she has not walked in 20 days, the dizziness could be due to orthostatic hypotension. Pt was able to pivot, turn to chair and sit down with verbal cue to make sure legs were touching the back of the chair. Pt stated "My goal is to walk 10 minutes each day after I leave the hospital". Pt is progressing well towards goals. As long as pt is able to participate in rehab therapies, anticipate good functional progress. Will continue working on weight bearing through  RLE when walking and extension in fingers on R wrist. ?  ?Recommendations for follow up therapy are one component of a multi-disciplinary discharge planning process, led by the attending physician.  Recommendations may be updated based on patient status, additional functional criteria and insurance authorization. ? ?Follow Up Recommendations ? Skilled nursing-short term rehab (<3 hours/day) ?  ?  ?Assistance Recommended at Discharge Intermittent Supervision/Assistance  ?Patient can return home with the following Assistance with cooking/housework;Assist for transportation;A lot of help with bathing/dressing/bathroom;Help with stairs or ramp for entrance;A little help with walking and/or transfers ?  ?Equipment Recommendations ? Other (comment) (Left platform RW)  ?  ?Recommendations for Other Services   ? ? ?  ?Precautions / Restrictions Precautions ?Precautions: Fall ?Precaution Comments: L wrist splint for comfort; L wrist/digit ROM ok ?Restrictions ?Weight Bearing Restrictions: Yes ?RLE Weight Bearing: Weight bearing as tolerated ?Other Position/Activity Restrictions: Encourage R knee ROM as tolerated  ?  ? ?Mobility ? Bed Mobility ?Overal bed mobility: Independent ?Bed Mobility: Supine to Sit, Sit to Supine ?  ?  ?Supine to sit: Modified independent (Device/Increase time) ?Sit to supine: Min guard ?  ?General bed mobility comments: Pt used belt to help her move RLE ?  ? ?Transfers ?Overall transfer level: Modified independent ?Equipment used: Left platform walker ?Transfers: Sit to/from Stand ?Sit to Stand: Min guard ?  ?Step pivot transfers: Min guard ?  ?  ?  ?General transfer comment: Pt was able to use belt to bring RLE to ground independently. Pt brought left platform walker to  herself to use when standing up. ?  ? ?Ambulation/Gait ?Ambulation/Gait assistance: Min guard ?  ?Assistive device: Left platform walker ?Gait Pattern/deviations: Decreased step length - right, Decreased stance time - right, Decreased  dorsiflexion - right, Decreased weight shift to right, Step-to pattern ?  ?  ?  ?General Gait Details:  (Verbal cues were given to place R foot fully on the ground when beginning gait cycle.) ? ? ?Stairs ?  ?  ?  ?  ?  ? ? ?Wheelchair Mobility ?  ? ?Modified Rankin (Stroke Patients Only) ?  ? ? ?  ?Balance Overall balance assessment: Modified Independent ?Sitting-balance support: Feet supported, No upper extremity supported ?Sitting balance-Leahy Scale: Good ?  ?  ?Standing balance support: Reliant on assistive device for balance ?Standing balance-Leahy Scale: Fair ?Standing balance comment: Needs LUE support on L platform RW in standing ?  ?  ?  ?  ?  ?  ?  ?  ?  ?  ?  ?  ? ?  ?Cognition Arousal/Alertness: Awake/alert ?Behavior During Therapy: [facility] for tasks assessed/performed ?Overall Cognitive Status: Within Functional Limits for tasks assessed ?  ?  ?  ?  ?  ?  ?  ?  ?  ?  ?Current Attention Level: Focused ?  ?Following Commands: Follows one step commands consistently ?  ?Awareness: Intellectual ?  ?General Comments: At end of session, pt thanked PT for all her help and continuing to give her care. ?  ?  ? ?  ?Exercises Other Exercises ?Other Exercises: Discussed quad set for RLE, and demonstrated 7 reps. There was not full knee extension, but hamstring co-contraction was suspected but not felt. ?Other Exercises: Discussed using heating pack for L hands to warms up fingers, then extend them so they do not remain in flexion. Pt demonstrated 4-5 reps. ? ?  ?General Comments   ?  ?  ? ?Pertinent Vitals/Pain Pain Assessment ?Pain Assessment: Faces ?Faces Pain Scale: Hurts a little bit (Pt stated she had pain, but was smiling during session) ?Breathing: normal ?Pain Location: R knee ?Pain Intervention(s): Monitored during session, Other (comment) (RN gave pain meds prior to session start)  ? ? ?Home Living   ?  ?  ?  ?  ?  ?  ?  ?  ?  ?   ?  ?Prior Function    ?  ?  ?   ? ?PT Goals (current goals can now be found in  the care plan section) Acute Rehab PT Goals ?Patient Stated Goal: To get up and walk in the hallway ?PT Goal Formulation: With patient ?Time For Goal Achievement: 02/16/22 ?Potential to Achieve Goals: Good ?Progress towards PT goals: Progressing toward goals ? ?  ?Frequency ? ? ? Min 2X/week ? ? ? ?  ?PT Plan Current plan remains appropriate  ? ? ?Co-evaluation   ?  ?  ?  ?  ? ?  ?AM-PAC PT "6 Clicks" Mobility   ?Outcome Measure ? Help needed turning from your back to your side while in a flat bed without using bedrails?: None ?Help needed moving from lying on your back to sitting on the side of a flat bed without using bedrails?: None ?Help needed moving to and from a bed to a chair (including a wheelchair)?: None ?Help needed standing up from a chair using your arms (e.g., wheelchair or bedside chair)?: None ?Help needed to walk in hospital room?: None ?Help needed climbing 3-5 steps with a railing? :  Total ?6 Click Score: 21 ? ?  ?End of Session   ?Activity Tolerance: Patient tolerated treatment well ?Patient left: in chair;with call bell/phone within reach ?Nurse Communication: Mobility status;Other (comment) (Pt need for right lower buttock wound dressing) ?PT Visit Diagnosis: Other abnormalities of gait and mobility (R26.89);Pain;Dizziness and giddiness (R42) ?Pain - Right/Left: Right ?Pain - part of body: Knee ?  ? ? ?Time:  -  ?  ? ?Charges:             ?          ? ?Jamilex Bohnsack, SPT ?Acute Rehab Services ?Office [phone]  ? ? ?Tory Emerald ?02/16/2022, 12:23 PM ? ?

## 2022-02-16 NOTE — Discharge Summary (Addendum)
? ?Name: Natasha Long ?MRN: [id number] ?DOB: 27-Apr-1996 28 y.o. ?PCP: [name], MD ? ?Date of Admission: 01/28/2022 10:55 PM ?Date of Discharge: 02/16/22 11:05 AM ?Attending Physician: Lottie Mussel, MD ? ?Discharge Diagnosis: ?1.  Acute traumatic knee injury complicated by polyarticular streptococcal arthritis ?2.  Syphilis ?3.  ESRD on HD ?4.  Anemia of ESRD ?5.  History of borderline personality disorder, MDD, and GAD ?6.  Hypertension ? ?Discharge Medications: ?Allergies as of 02/16/2022   ? ?   Reactions  ? Prozac [fluoxetine Hcl] Other (See Comments)  ? Panic attack   ? Wellbutrin [bupropion] Other (See Comments)  ? Panic attack  ? Prednisone Other (See Comments)  ? Pt states that this med caused pancreatitis.   ? ?  ? ?  ?Medication List  ?  ? ?STOP taking these medications   ? ?acetaminophen 650 MG CR tablet ?Commonly known as: TYLENOL ?Replaced by: acetaminophen 500 MG tablet ?  ?gabapentin 600 MG tablet ?Commonly known as: NEURONTIN ?Replaced by: gabapentin 300 MG capsule ?  ? ?  ? ?TAKE these medications   ? ?acetaminophen 500 MG tablet ?Commonly known as: TYLENOL ?Take 2 tablets (1,000 mg total) by mouth every 8 (eight) hours. ?Replaces: acetaminophen 650 MG CR tablet ?  ?ARIPiprazole 15 MG tablet ?Commonly known as: ABILIFY ?Take 0.5 tablets (7.5 mg total) by mouth daily. ?  ?Blood Pressure Monitor 7 Devi ?1 Units by Does not apply route daily. Measure blood pressure daily ?  ?calcitRIOL 0.25 MCG capsule ?Commonly known as: ROCALTROL ?Take 0.25 mcg by mouth See admin instructions. 0.23mcg daily on dialysis days - Tuesday's,Thursday's, and Saturday's ?  ?calcium acetate 667 MG capsule ?Commonly known as: PHOSLO ?Take 667 mg by mouth 3 (three) times daily. ?  ?ceFAZolin 2-4 GM/100ML-% IVPB ?Commonly known as: ANCEF ?Inject 100 mLs (2 g total) into the vein Every Tuesday,Thursday,and Saturday with dialysis for 26 days. ?  ?Darbepoetin Alfa 150 MCG/0.3ML Sosy injection ?Commonly known as:  ARANESP ?Inject 0.3 mLs (150 mcg total) into the vein every Saturday with hemodialysis. ?Start taking on: February 21, 2022 ?  ?diclofenac Sodium 1 % Gel ?Commonly known as: VOLTAREN ?Apply 4 g topically 4 (four) times daily as needed (Pain to L wrist and R knee). ?  ?diphenhydrAMINE-zinc acetate cream ?Commonly known as: BENADRYL ?Apply 1 application. topically 2 (two) times daily as needed for itching. ?  ?gabapentin 300 MG capsule ?Commonly known as: NEURONTIN ?Take 1 capsule (300 mg total) by mouth at bedtime. ?Replaces: gabapentin 600 MG tablet ?  ?hydrocerin Crea ?Apply 1 application. topically 2 (two) times daily. ?  ?hydrOXYzine 25 MG capsule ?Commonly known as: VISTARIL ?Take 1 capsule (25 mg total) by mouth every 8 (eight) hours as needed. ?What changed: reasons to take this ?  ?lidocaine-prilocaine cream ?Commonly known as: EMLA ?Apply 1 application topically daily as needed (port site access on dialysis days (Tuesday's, Thursday's, and Saturday's). ?  ?losartan 50 MG tablet ?Commonly known as: COZAAR ?Take 50 mg by mouth daily. ?  ?Methocarbamol 1000 MG Tabs ?Take 1,000 mg by mouth at bedtime. ?  ?omeprazole 20 MG capsule ?Commonly known as: PRILOSEC ?Take 1 capsule (20 mg total) by mouth daily. ?  ?polyethylene glycol 17 g packet ?Commonly known as: MIRALAX / GLYCOLAX ?Take 17 g by mouth 2 (two) times daily. ?  ?ramelteon 8 MG tablet ?Commonly known as: ROZEREM ?Take 1 tablet (8 mg total) by mouth at bedtime. ?  ?Renal Vitamin 0.8 MG Tabs ?Take 1  tablet by mouth daily. ?  ?senna-docusate 8.6-50 MG tablet ?Commonly known as: Senokot-S ?Take 2 tablets by mouth 2 (two) times daily. ?  ?sevelamer carbonate 800 MG tablet ?Commonly known as: RENVELA ?Take 2 tablets (1,600 mg total) by mouth 3 (three) times daily with meals. ?  ? ?  ? ? ?Disposition and follow-up:   ?Ms.Myishia Scholler was discharged from [facility] in Stable condition.  At the hospital follow up visit please address: ? ?1.   Knee pain and infection, STI symptoms, compliance with ESRD, and psychotropic medications. ? ?2.  Labs / imaging needed at time of follow-up: N/A ? ?3.  Pending labs/ test needing follow-up: T. pallidum antibody, cervicovaginal ancillary for STI panel.  We will forward any positive results to SNF ? ?Follow-up Appointments: ? Follow-up Information   ? ? Milly Jakob, MD. Schedule an appointment as soon as possible for a visit in 2 week(s).   ?Specialty: Orthopedic Surgery ?Why: for your left wrist ?Contact information: ?[address]. ?[place] [address] ?[phone] ? ? ?  ?  ? ?  ?  ? ?  ? ? ?Hospital Course by problem list: ?Acute traumatic knee injury complicated by polyarticular streptococcal septic arthritis ?On day of discharge, patient afebrile and CBC showing WBC 9.2. S/p I&D of right knee and left wrist by ortho with aspirate cultures showing group G strep. Patient on cefazolin x6 weeks with HD (end date 4/15), per ID.  CRP downtrending appropriately (21.9 on 3/19).  She is WBAT per ortho, and has been willing to work with PT. Biggest issue during stay was pain control. Pain medication regimen has been adjusted to meet patient's needs while attempting to reduce opioid burden. Has been transitioned off of PCA dilaudid pump and IV dilaudid.  Discharging pain regimen: Tylenol 1000 mg every 8 hours, Robaxin 1000 mg nightly, gabapentin 300 mg nightly, and Voltaren gel 4 times daily as needed. ?  ?Syphilis, confirmed ?Patient reported ongoing yellowish vaginal discharge but denied genital lesions or pain. Recent hx of Gonorrhea 12/27/21, and unclear if patient completed treatment.  ?STI panel: RPR reactive (T. Pallidum total Ab pending), HIV NR, Cervicovaginal ancillary pending.  Will route results to SNF as available. ?  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 Patient  reported noncompliance with hemodialysis due to being unhomed. Multiple presentations at multiple ED's after missing HD.  During this admission, patient restored to Tuesday, Thursday, and Saturday HD schedule.  On day of discharge, and worked with social work to arrange transportation to HD via [employer]. ?  ?Anemia of ESRD ?Acute blood loss anemia ?Status post 3 units PRBCs.  She remains asymptomatic from this. Hemoglobin has remained stable. ?-Aranesp 100 mg with HD per nephro ?-Trend CBC, transfuse if hemoglobin less than 7 ?  ?History of borderline personality disorder ?Major depressive disorder ?Generalized anxiety disorder ?Follow[facility] at [facility] for [facility].  Home medications continued through admission, with adjustments: Abilify 7.5 mg, gabapentin 300 mg nightly, and hydroxyzine 50 mg 3 times daily as needed for anxiety.  Patient to continue ramelteon 8 mg nightly for insomnia. TOC was consulted for unhomed status, and outpatient LCSW provided patient with housing voucher. ?  ?Hypertension ?Patient was non-adherent with home medications.  Throughout admission, BP has improved with HD and home losartan 50 mg daily.  To be continued upon discharge. ? ?Discharge Subjective: ?Patient reports  sleeping well and an improvement in her pain. Has a list of questions, and all of her concerns are addressed; she is appreciative. ? ?Discharge Exam:   ?BP 136/78 (BP Location: Right Arm)   Pulse 96   Temp 98.9 ?F (37.2 ?C) (Oral)   Resp 19   Ht 5\' 8"  (1.727 m)   Wt 93 kg   LMP 01/28/2022   SpO2 100%   BMI 31.17 kg/m?  ? ?General: young female, lying in bed, NAD, alert ?Pulm: normal work of breathing on RA. ?MSK: RLE with motion as patient adjusted herself in bed, sitting up. No pain elicited. LUE with mild improvements of mobility in brace, and no pain elicited when patient used LUE to assist in sitting up. ?Skin: Back with improvements to excoriations (from patient scratching) and peeling  skin with use of Eucerin cream. Similar appearance of skin around umbilicus. Maybe secondary syphilis. ?Neuro: A&O x 3, no focal deficits noted. ?Psych: Normal mood and affect ? ?Pertinent Labs, Studies, and Procedures:  ?A

## 2022-02-16 NOTE — Progress Notes (Signed)
Called [facility] to give report [place]. ?

## 2022-02-16 NOTE — TOC Transition Note (Signed)
Transition of Care (TOC) - CM/SW Discharge Note ? ? ?Patient Details  ?Name: Natasha Long ?MRN: [id number] ?Date of Birth: 04-21-1996 ? ?Transition of Care (TOC) CM/SW Contact:  ?Paulene Floor Amous Crewe, LCSWA ?Phone Number: ?02/16/2022, 2:09 PM ? ? ?Clinical Narrative:    ?Patient will DC to: [facility] ?Anticipated DC date:  02/16/2022 ?Transport by:  Corey Harold ? ? ?Per MD patient ready for DC to SNF. RN to call report prior to discharge [phone] room 116B. RN, patient,  and facility notified of DC. Discharge Summary and FL2 sent to facility. DC packet on chart. Ambulance transport requested for patient.  ? ?CSW will sign off for now as social work intervention is no longer needed. Please consult [place] again if new needs arise. ?  ? ? ?Final next level of care: [place] ?Barriers to Discharge: Barriers Resolved ? ? ?Patient Goals and CMS Choice ?  ?  ?  ? ?Discharge Placement ?  ?           ?Patient chooses bed at:  ([facility]) ?Patient to be transferred to facility by: [facility] ?Name of family member notified: patient alert and oriented ?Patient and family notified of of transfer: 02/16/22 ? ?Discharge Plan and Services ?  ?  ?           ?  ?  ?  ?  ?  ?  ?  ?  ?  ?  ? ?Social Determinants of Health (SDOH) Interventions ?  ? ? ?Readmission Risk Interventions ?No flowsheet data found. ? ? ? ? ?

## 2022-02-16 NOTE — Progress Notes (Signed)
Case discussed with CSW. Pt to d/c to [facility] snf today. Contacted [facility] to make clinic aware pt will d/c to snf today and will resume care at clinic tomorrow.  ? ?Melven Sartorius ?Renal Navigator ?[phone] ?

## 2022-02-16 NOTE — Progress Notes (Signed)
Called [facility] to give held 20 minutes will call back ?

## 2022-02-16 NOTE — TOC Progression Note (Signed)
Transition of Care (TOC) - Initial/Assessment Note  ? ? ?Patient Details  ?Name: Natasha Long ?MRN: [id number] ?Date of Birth: 06-Sep-1996 ? ?Transition of Care (TOC) CM/SW Contact:    ?Paulene Floor , LCSWA ?Phone Number: ?02/16/2022, 10:38 AM ? ?Clinical Narrative:                 ? ?CSW met with the patient at bedside and assisted the patietn with calling DSS to schedule transportation to dialysis.  CSW spoke with Tye Savoy at [facility] and was informed that transportation is scheduled for the patient.  [name] reported that she will call social worker Terri Piedra with [facility] to confirm information.   ? ?CSW contacted the facility to inform them that transportation has been secured.  The facility can accept the patient today. ? ?MD and floor RN notified.   ? ?Pending: discharge summary   ?  ?Barriers to Discharge: Continued Medical Work up, Homeless with medical needs ? ? ?Patient Goals and CMS Choice ?  ?  ?  ? ?Expected Discharge Plan and Services ?  ?  ?  ?  ?Living arrangements for the past 2 months: Homeless ?                ?  ?  ?  ?  ?  ?  ?  ?  ?  ?  ? ?Prior Living Arrangements/Services ?Living arrangements for the past 2 months: Homeless ?Lives with:: Other (Comment) ?Patient language and need for interpreter reviewed:: Yes ?       ?Need for Family Participation in Patient Care: Yes (Comment) ?Care giver support system in place?: No (comment) ?  ?Criminal Activity/Legal Involvement Pertinent to Current Situation/Hospitalization: No - Comment as needed ? ?Activities of Daily Living ?Home Assistive Devices/Equipment: None ?ADL Screening (condition at time of admission) ?Patient's cognitive ability adequate to safely complete daily activities?: Yes ?Is the patient deaf or have difficulty hearing?: No ?Does the patient have difficulty seeing, even when wearing glasses/contacts?: No ?Does the patient have difficulty concentrating, remembering, or making decisions?: No ?Patient able to  express need for assistance with ADLs?: Yes ?Does the patient have difficulty dressing or bathing?: No ?Independently performs ADLs?: Yes (appropriate for developmental age) ?Does the patient have difficulty walking or climbing stairs?: No ?Weakness of Legs: Right ?Weakness of Arms/Hands: None ? ?Permission Sought/Granted ?  ?Permission granted to share information with : Yes, Verbal Permission Granted ?   ? Permission granted to share info w AGENCY: Facilities if physical therapy recommends ?   ?   ? ?Emotional Assessment ?Appearance:: Appears older than stated age ?Attitude/Demeanor/Rapport: Engaged ?Affect (typically observed): Appropriate ?Orientation: : Oriented to Situation, Oriented to  Time, Oriented to Place, Oriented to Self ?Alcohol / Substance Use: Illicit Drugs ?Psych Involvement: No (comment) ? ?Admission diagnosis:  End stage renal disease ([place]) [N18.6] ?Dialysis patient, noncompliant ([place]) [Z91.15] ?Effusion of right knee [M25.461] ?Knee pain, acute [M25.569] ?Osteoarthritis of right knee, unspecified osteoarthritis type [M17.11] ?Patient Active Problem List  ? Diagnosis Date Noted  ? Syphilis   ? Pain management   ? Septic arthritis of multiple joints ([place]) 02/01/2022  ? Group G streptococcal infection 02/01/2022  ? Left wrist pain   ? Hyperkalemia 01/30/2022  ? Effusion of right knee   ? Arthritis, septic ([place])   ? Dialysis patient, noncompliant ([place])   ? Knee pain, acute 01/29/2022  ? Hypokalemia 01/29/2022  ? Hyperphosphatemia 01/29/2022  ? Chest pain   ? Hypervolemia associated  with renal insufficiency 12/23/2021  ? Elevated troponin 12/23/2021  ? Tobacco use 08/11/2021  ? Nipple discharge 08/11/2021  ? Breast pain 08/11/2021  ? Exposure to sexually transmitted disease (STD) 08/11/2021  ? Complication of vascular dialysis catheter 02/12/2021  ? Moderate protein-calorie malnutrition ([place]) 01/27/2021  ? Allergy, unspecified, initial encounter 01/23/2021  ? Anemia in chronic kidney disease  01/23/2021  ? End stage renal disease ([place]) 01/23/2021  ? Iron deficiency anemia, unspecified 01/23/2021  ? Other specified coagulation defects ([place]) 01/23/2021  ? Secondary hyperparathyroidism of renal origin ([place]) 01/23/2021  ? Anemia   ? Suicidal ideations   ? Influenza vaccine refused 12/19/2020  ? Morbid obesity ([place]) 01/30/2020  ? 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 Cocaine use, 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[place]) 01/30/2020  ? Affective psychosis, bipolar ([place]) 06/27/2019  ? Borderline personality disorder ([place]) 11/16/2018  ? Moderate cannabis use disorder ([place]) 11/16/2018  ? Severe recurrent major depression without psychotic features ([place]) 11/15/2018  ? Chronic hypertension 04/04/2018  ? PTSD (post-traumatic stress disorder) 04/01/2018  ? ?PCP:  [name], MD ?Pharmacy:   ?[facility] [place], [place] - [address] ?[address] ?[place] [address] ?Phone: [phone] Fax: [phone] ? ?[facility] ?[address] ?[place] [address] ?Phone: [phone] Fax: [phone] ? ? ? ? ?Social Determinants of Health (SDOH) Interventions ?  ? ?Readmission Risk Interventions ?No flowsheet data found. ? ? ?

## 2022-02-17 LAB — BPAM RBC
Blood Product Expiration Date: 202304142359
ISSUE DATE / TIME: 202303191509
Unit Type and Rh: 600

## 2022-02-17 LAB — TYPE AND SCREEN
ABO/RH(D): A NEG
Antibody Screen: NEGATIVE
Unit division: 0

## 2022-02-18 ENCOUNTER — Ambulatory Visit: Payer: Medicaid Other | Admitting: Infectious Disease

## 2022-02-20 NOTE — Progress Notes (Signed)
Late entry  ? ?  ?Provided service recovery on patient. Recent notification was provided that patient may have been potentially exposed to Hepatitis B while in the Kidney Dialysis Unit (KDU). Patient verbalized understanding. All questions have been answered. Attending MD and Infection Disease MD have been notified and will continue to follow patient.  ?  ?

## 2022-03-07 ENCOUNTER — Encounter (HOSPITAL_COMMUNITY): Payer: Self-pay | Admitting: Emergency Medicine

## 2022-03-07 ENCOUNTER — Emergency Department (HOSPITAL_COMMUNITY)
Admission: EM | Admit: 2022-03-07 | Discharge: 2022-03-08 | Disposition: A | Discharge status: Skilled Nursing Facility | Payer: Medicaid Other | Attending: Emergency Medicine | Admitting: Emergency Medicine

## 2022-03-07 ENCOUNTER — Other Ambulatory Visit: Payer: Self-pay

## 2022-03-07 DIAGNOSIS — J45909 Unspecified asthma, uncomplicated: Secondary | ICD-10-CM | POA: Diagnosis not present

## 2022-03-07 DIAGNOSIS — Z79899 Other long term (current) drug therapy: Secondary | ICD-10-CM | POA: Insufficient documentation

## 2022-03-07 DIAGNOSIS — R42 Dizziness and giddiness: Secondary | ICD-10-CM | POA: Diagnosis present

## 2022-03-07 DIAGNOSIS — I120 Hypertensive chronic kidney disease with stage 5 chronic kidney disease or end stage renal disease: Secondary | ICD-10-CM | POA: Insufficient documentation

## 2022-03-07 DIAGNOSIS — N186 End stage renal disease: Secondary | ICD-10-CM | POA: Diagnosis not present

## 2022-03-07 DIAGNOSIS — Z992 Dependence on renal dialysis: Secondary | ICD-10-CM | POA: Insufficient documentation

## 2022-03-07 DIAGNOSIS — D649 Anemia, unspecified: Secondary | ICD-10-CM | POA: Diagnosis not present

## 2022-03-07 LAB — CBC WITH DIFFERENTIAL/PLATELET
Abs Immature Granulocytes: 0.07 10*3/uL (ref 0.00–0.07)
Basophils Absolute: 0 10*3/uL (ref 0.0–0.1)
Basophils Relative: 0 %
Eosinophils Absolute: 0.4 10*3/uL (ref 0.0–0.5)
Eosinophils Relative: 5 %
HCT: 22.3 % — ABNORMAL LOW (ref 36.0–46.0)
Hemoglobin: 6.8 g/dL — CL (ref 12.0–15.0)
Immature Granulocytes: 1 %
Lymphocytes Relative: 20 %
Lymphs Abs: 1.4 10*3/uL (ref 0.7–4.0)
MCH: 29.6 pg (ref 26.0–34.0)
MCHC: 30.5 g/dL (ref 30.0–36.0)
MCV: 97 fL (ref 80.0–100.0)
Monocytes Absolute: 0.6 10*3/uL (ref 0.1–1.0)
Monocytes Relative: 9 %
Neutro Abs: 4.5 10*3/uL (ref 1.7–7.7)
Neutrophils Relative %: 65 %
Platelets: 291 10*3/uL (ref 150–400)
RBC: 2.3 MIL/uL — ABNORMAL LOW (ref 3.87–5.11)
RDW: 15.3 % (ref 11.5–15.5)
WBC: 6.9 10*3/uL (ref 4.0–10.5)
nRBC: 0 % (ref 0.0–0.2)

## 2022-03-07 LAB — COMPREHENSIVE METABOLIC PANEL
ALT: <5 U/L (ref 0–44)
AST: 12 U/L — ABNORMAL LOW (ref 15–41)
Albumin: 2.3 g/dL — ABNORMAL LOW (ref 3.5–5.0)
Alkaline Phosphatase: 57 U/L (ref 38–126)
Anion gap: 13 (ref 5–15)
BUN: 44 mg/dL — ABNORMAL HIGH (ref 6–20)
CO2: 31 mmol/L (ref 22–32)
Calcium: 9.2 mg/dL (ref 8.9–10.3)
Chloride: 95 mmol/L — ABNORMAL LOW (ref 98–111)
Creatinine, Ser: 9.96 mg/dL — ABNORMAL HIGH (ref 0.44–1.00)
GFR, Estimated: 5 mL/min — ABNORMAL LOW (ref >60)
Glucose, Bld: 92 mg/dL (ref 70–99)
Potassium: 4.3 mmol/L (ref 3.5–5.1)
Sodium: 139 mmol/L (ref 135–145)
Total Bilirubin: 0.6 mg/dL (ref 0.3–1.2)
Total Protein: 8 g/dL (ref 6.5–8.1)

## 2022-03-07 LAB — PREPARE RBC (CROSSMATCH)

## 2022-03-07 MED ORDER — SODIUM CHLORIDE 0.9 % IV SOLN
10.0000 mL/h | Freq: Once | INTRAVENOUS | Status: AC
  Administered 2022-03-08: 10 mL/h via INTRAVENOUS

## 2022-03-07 NOTE — ED Provider Notes (Signed)
?[facility] ?Provider Note ? ? ?CSN: [id number] ?Arrival date & time: 03/07/22  2109 ? ?  ? ?History ? ?Chief Complaint  ?Patient presents with  ? Abnormal Lab  ? ? ?Natasha Long is a 26 y.o. female. ? ?The history is provided by the patient and medical records. No language interpreter was used.  ?Abnormal Lab ? ?26 year old female significant history of end-stage renal disease currently on dialysis, history of hypertension, asthma, who brought here via EMS from nursing facility due to having low hemoglobin.  Patient with known history of anemia secondary to chronic kidney disease.  Her baseline hemoglobin is usually around 8.  She went to her scheduled dialysis session today and was noted to have hemoglobin in the 6 and recommended to come here for blood transfusion.  She denies any abnormal bleeding but she does endorse feeling fatigue and lightheadedness for the past 2 days.  No fever no significant pain. ? ?Home Medications ?Prior to Admission medications   ?Medication Sig Start Date End Date Taking? Authorizing Provider  ?acetaminophen (TYLENOL) 500 MG tablet Take 2 tablets (1,000 mg total) by mouth every 8 (eight) hours. 02/16/22   [name], MD  ?ARIPiprazole (ABILIFY) 15 MG tablet Take 0.5 tablets (7.5 mg total) by mouth daily. 12/27/21   Regalado, Belkys A, MD  ?B Complex-C-Folic Acid (RENAL VITAMIN) 0.8 MG TABS Take 1 tablet by mouth daily. 10/29/21   [provider]  ?Blood Pressure Monitoring (BLOOD PRESSURE MONITOR 7) DEVI 1 Units by Does not apply route daily. Measure blood pressure daily 02/29/20   [name], MD  ?calcitRIOL (ROCALTROL) 0.25 MCG capsule Take 0.25 mcg by mouth See admin instructions. 0.56mcg daily on dialysis days - Tuesday's,Thursday's, and Saturday's 09/25/20   [provider]  ?calcium acetate (PHOSLO) 667 MG capsule Take 667 mg by mouth 3 (three) times daily. 07/03/21   [provider]  ?ceFAZolin (ANCEF)  2-4 GM/100ML-% IVPB Inject 100 mLs (2 g total) into the vein Every Tuesday,Thursday,and Saturday with dialysis for 26 days. 02/16/22 03/14/22  [name], MD  ?Darbepoetin Alfa (ARANESP) 150 MCG/0.3ML SOSY injection Inject 0.3 mLs (150 mcg total) into the vein every Saturday with hemodialysis. 02/21/22   [name], MD  ?diclofenac Sodium (VOLTAREN) 1 % GEL Apply 4 g topically 4 (four) times daily as needed (Pain to L wrist and R knee). 02/16/22   [name], MD  ?diphenhydrAMINE-zinc acetate (BENADRYL) cream Apply 1 application. topically 2 (two) times daily as needed for itching. 02/16/22   [name], MD  ?gabapentin (NEURONTIN) 300 MG capsule Take 1 capsule (300 mg total) by mouth at bedtime. 02/16/22   [name], MD  ?hydrocerin (EUCERIN) CREA Apply 1 application. topically 2 (two) times daily. 02/16/22   [name], MD  ?hydrOXYzine (VISTARIL) 25 MG capsule Take 1 capsule (25 mg total) by mouth every 8 (eight) hours as needed. ?Patient taking differently: Take 25 mg by mouth every 8 (eight) hours as needed for anxiety. 08/11/21   [name], MD  ?lidocaine-prilocaine (EMLA) cream Apply 1 application topically daily as needed (port site access on dialysis days (Tuesday's, Thursday's, and Saturday's). 03/06/21   [provider]  ?losartan (COZAAR) 50 MG tablet Take 50 mg by mouth daily. 12/11/21   [provider]  ?methocarbamol (ROBAXIN) 500 MG tablet Take 2 tablets (1,000 mg total) by mouth at bedtime. 02/16/22   [name], MD  ?omeprazole (PRILOSEC) 20 MG capsule Take 1 capsule (20 mg total)  by mouth daily. 08/11/21   [name], MD  ?polyethylene glycol (MIRALAX / GLYCOLAX) 17 g packet Take 17 g by mouth 2 (two) times daily. 02/16/22   [name], MD  ?ramelteon (ROZEREM) 8 MG tablet Take 1 tablet (8 mg total) by mouth at bedtime. 02/16/22   [name], MD  ?senna-docusate (SENOKOT-S) 8.6-50 MG tablet Take 2 tablets by mouth 2 (two) times daily.  02/16/22   [name], MD  ?sevelamer carbonate (RENVELA) 800 MG tablet Take 2 tablets (1,600 mg total) by mouth 3 (three) times daily with meals. 02/16/22   [name], MD  ?   ? ?Allergies    ?Prozac [fluoxetine hcl], Wellbutrin [bupropion], and Prednisone   ? ?Review of Systems   ?Review of Systems  ?All other systems reviewed and are negative. ? ?Physical Exam ?Updated Vital Signs ?BP (!) 146/76 (BP Location: Right Arm)   Pulse 88   Temp 98.7 ?F (37.1 ?C) (Oral)   Resp 17   Ht 5\' 8"  (1.727 m)   Wt 93 kg   SpO2 99%   BMI 31.17 kg/m?  ?Physical Exam ?Vitals and nursing note reviewed.  ?Constitutional:   ?   General: She is not in acute distress. ?   Appearance: She is well-developed. She is obese.  ?HENT:  ?   Head: Atraumatic.  ?Eyes:  ?   Conjunctiva/sclera: Conjunctivae normal.  ?Cardiovascular:  ?   Rate and Rhythm: Normal rate and regular rhythm.  ?   Pulses: Normal pulses.  ?   Heart sounds: Normal heart sounds.  ?Pulmonary:  ?   Effort: Pulmonary effort is normal.  ?Abdominal:  ?   Tenderness: There is no abdominal tenderness.  ?Musculoskeletal:  ?   Cervical back: Neck supple.  ?Skin: ?   Findings: No rash.  ?Neurological:  ?   Mental Status: She is alert.  ?Psychiatric:     ?   Mood and Affect: Mood normal.  ? ? ?ED Results / Procedures / Treatments   ?Labs ?(all labs ordered are listed, but only abnormal results are displayed) ?Labs Reviewed  ?CBC WITH DIFFERENTIAL/PLATELET - Abnormal; Notable for the following components:  ?    Result Value  ? RBC 2.30 (*)   ? Hemoglobin 6.8 (*)   ? HCT 22.3 (*)   ? All other components within normal limits  ?COMPREHENSIVE METABOLIC PANEL - Abnormal; Notable for the following components:  ? Chloride 95 (*)   ? BUN 44 (*)   ? Creatinine, Ser 9.96 (*)   ? Albumin 2.3 (*)   ? AST 12 (*)   ? GFR, Estimated 5 (*)   ? All other components within normal limits  ?TYPE AND SCREEN  ?PREPARE RBC (CROSSMATCH)  ? ? ?EKG ?None ? ?Radiology ?No results  found. ? ?Procedures ?Marland KitchenCritical Care ?Performed by: Domenic Moras, PA-C ?Authorized by: Domenic Moras, PA-C  ? ?Critical care provider statement:  ?  Critical care time (minutes):  30 ?  Critical care was time spent personally by me on the following activities:  Development of treatment plan with patient or surrogate, discussions with consultants, evaluation of patient's response to treatment, examination of patient, ordering and review of laboratory studies, ordering and review of radiographic studies, ordering and performing treatments and interventions, pulse oximetry, re-evaluation of patient's condition and review of old charts  ? ? ?Medications Ordered in ED ?Medications  ?0.9 %  sodium chloride infusion (10 mL/hr Intravenous New Bag/Given 03/08/22 0243)  ?oxyCODONE-acetaminophen (PERCOCET/ROXICET) 5-325 MG  per tablet 1 tablet (1 tablet Oral Given 03/08/22 0134)  ? ? ?ED Course/ Medical Decision Making/ A&P ?  ?                        ?Medical Decision Making ?Amount and/or Complexity of Data Reviewed ?Labs: ordered. ? ?Risk ?Prescription drug management. ? ? ?BP (!) 146/76 (BP Location: Right Arm)   Pulse 88   Temp 98.7 ?F (37.1 ?C) (Oral)   Resp 17   Ht 5\' 8"  (1.727 m)   Wt 93 kg   SpO2 99%   BMI 31.17 kg/m?  ? ?10:20 PM ?This is a 26 year old female sent with history of end-stage renal disease currently on Tuesday Thursday Saturday dialysis who is here for treatment of symptomatic anemia.  She endorsed feeling lightheadedness and weak for the past 2 days.  Her hemoglobin that was obtain at her dialysis session was not 6, hemoglobin rechecked here and is 6.8.  Her baseline hemoglobin is usually eights.  She denies any abnormal bleeding no report of any hematochezia or melena or hematemesis.  Will order 1 unit of packed red blood cells for blood transfusion. ? ?1:15 AM ?Patient also requesting for pain medication for her right knee.  She has had previous history of osteomyelitis involving the required surgical  intervention and antibiotic.  No report of worsening infection.  Will provide a pain medication. ? ?3:54 AM ?Patient have received her blood transfusion.  She is stable for discharge. ? ?This patient presents to the ED for concer

## 2022-03-07 NOTE — ED Triage Notes (Signed)
Pt BIB EMS from [place] at [facility] with c/o low hemoglobin of 6 at dialysis. ?

## 2022-03-07 NOTE — ED Provider Notes (Incomplete)
26 year old female with end-stage renal disease on hemodialysis was found to have a low hemoglobin at her dialysis session today and was referred here for blood transfusion.  Hemoglobin here is 6.8, baseline hemoglobin 8.0.  Last transfusion was about 1 month ago.  She will be given a blood transfusion here. ?

## 2022-03-07 NOTE — ED Notes (Signed)
Date and time results received: 03/07/22 2151 ?(use smartphrase ".now" to insert current time) ? ?Test: hemoglobin ?Critical Value: 6.8 ? ?Name of Provider Notified: Zenia Resides, MD ? ?Orders Received? Or Actions Taken?: n/a ?

## 2022-03-08 LAB — TYPE AND SCREEN
ABO/RH(D): A NEG
Antibody Screen: NEGATIVE
Unit division: 0

## 2022-03-08 LAB — BPAM RBC
Blood Product Expiration Date: 202304192359
ISSUE DATE / TIME: 202304082310
Unit Type and Rh: 600

## 2022-03-08 MED ORDER — OXYCODONE-ACETAMINOPHEN 5-325 MG PO TABS
1.0000 | ORAL_TABLET | Freq: Once | ORAL | Status: AC
  Administered 2022-03-08: 1 via ORAL
  Filled 2022-03-08: qty 1

## 2022-03-08 NOTE — ED Notes (Signed)
Pt verbalizes understanding of discharge instructions. Opportunity for questions and answers were provided. Pt discharged from the ED to [facility] via [facility].  ?

## 2022-03-08 NOTE — ED Notes (Signed)
Called [facility] to transport patient to [facility] ?

## 2022-03-08 NOTE — ED Notes (Signed)
Attempted report to [facility] x2.  ?

## 2022-03-08 NOTE — ED Notes (Signed)
Attempted report to [facility] x1.  ?

## 2022-03-08 NOTE — Discharge Instructions (Signed)
Please continue with your dialysis schedule and follow-up with your doctor for further managements of your anemia. ?

## 2022-03-11 ENCOUNTER — Telehealth: Payer: Self-pay

## 2022-03-11 NOTE — Telephone Encounter (Signed)
Called patient in regards to getting missed appointment rescheduled, left voicemail to call us back to get scheduled.  ?

## 2022-03-12 ENCOUNTER — Telehealth: Payer: Self-pay | Admitting: Infectious Disease

## 2022-03-12 NOTE — Telephone Encounter (Signed)
Attempted to call pt to reschedule appt w/ Dr. Tommy Medal. No answer and left VM.  ?

## 2022-03-16 ENCOUNTER — Telehealth: Payer: Self-pay

## 2022-03-16 NOTE — Telephone Encounter (Signed)
Called patient in regards to get missed appointment rescheduled, left patient a voicemail to give [place] a call back.  ?

## 2022-03-22 NOTE — Progress Notes (Deleted)
Complete physical exam  Patient: Natasha Long   DOB: August 16, 1996   26 y.o. Female  MRN: [id number]  Subjective:    No chief complaint on file.   Dorette Wahab is a 26 y.o. female who presents today for a complete physical exam. She reports consuming a {diet types:17450} diet. {types:19826} She generally feels {DESC; WELL/FAIRLY WELL/POORLY:18703}. She reports sleeping {DESC; WELL/FAIRLY WELL/POORLY:18703}. She {does/does not:200015} have additional problems to discuss today.   Pap  Date of Admission: 01/28/2022 10:55 PM Date of Discharge: 02/16/22 11:05 AM Attending Physician: Lottie Mussel, MD   Discharge Diagnosis: 1.  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  Syphilis 3.  ESRD on HD 4.  Anemia of ESRD 5.  History of borderline personality disorder, MDD, and GAD 6.  Hypertension   Disposition and follow-up:   Ms.Natasha Long was discharged from [facility] in Stable condition.  At the hospital follow up visit please address:   1.  Knee pain and infection, STI symptoms, compliance with ESRD, and psychotropic medications.   2.  Labs / imaging needed at time of follow-up: N/A   3.  Pending labs/ test needing follow-up: T. pallidum antibody, cervicovaginal ancillary for STI panel.  We will forward any positive results to SNF   Follow-up Appointments:    Hospital Course by problem list: Acute traumatic knee injury complicated by polyarticular streptococcal septic arthritis On day of discharge, patient afebrile and CBC showing WBC 9.2. S/p I&D of right knee and left wrist by ortho with aspirate cultures showing group G strep. Patient on cefazolin x6 weeks with HD (end date 4/15), per ID.  CRP downtrending appropriately (21.9 on 3/19).  She is WBAT per ortho, and has been willing to work with PT. Biggest issue during stay was pain control. Pain medication regimen has been adjusted to meet patient's needs while  attempting to reduce opioid burden. Has been transitioned off of PCA dilaudid pump and IV dilaudid.  Discharging pain regimen: Tylenol 1000 mg every 8 hours, Robaxin 1000 mg nightly, gabapentin 300 mg nightly, and Voltaren gel 4 times daily as needed.   Syphilis, confirmed Patient reported ongoing yellowish vaginal discharge but denied genital lesions or pain. Recent hx of Gonorrhea 12/27/21, and unclear if patient completed treatment.  STI panel: RPR reactive (T. Pallidum total Ab pending), HIV NR, Cervicovaginal ancillary pending.  Will route results to SNF as available.   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 Patient reported noncompliance with hemodialysis due to being unhomed. Multiple presentations at multiple ED's after missing HD.  During this admission, patient restored to Tuesday, Thursday, and Saturday HD schedule.  On day of discharge, and worked with social work to arrange transportation to HD via [employer].   Anemia of ESRD Acute blood loss anemia Status post 3 units PRBCs.  She remains asymptomatic from this. Hemoglobin has remained stable. -Aranesp 100 mg with HD per nephro -Trend CBC, transfuse if hemoglobin less than 7   History of borderline personality disorder Major depressive disorder Generalized anxiety disorder Follows at [facility] for care.  Home medications continued through admission, with adjustments: Abilify 7.5 mg, gabapentin 300 mg nightly, and hydroxyzine 50 mg 3 times daily as needed for anxiety.  Patient to continue ramelteon 8 mg nightly for insomnia. TOC was consulted for unhomed status, and outpatient LCSW provided patient with housing voucher.  Hypertension Patient was non-adherent with home medications.  Throughout admission, BP has improved with HD and home losartan 50 mg daily.  To be  continued upon discharge.   Discharge Subjective: Patient reports sleeping well and an improvement in her pain. Has a list of questions, and all of her concerns are addressed; she is appreciative. Most recent fall risk assessment:    08/11/2021    3:14 PM  [place] in the past year? 1  Number falls in past yr: 1  Injury with Fall? 0     Most recent depression screenings:    08/11/2021    3:14 PM 12/30/2020   12:27 PM  PHQ 2/9 Scores  PHQ - 2 Score 6   PHQ- 9 Score 21      Information is confidential and restricted. Go to Review Flowsheets to unlock data.    {VISON DENTAL STD PSA (Optional):27386}  {History (Optional):23778}  Patient Care Team: [name], MD as PCP - General (Pulmonary Disease)   Outpatient Medications Prior to Visit  Medication Sig  . acetaminophen (TYLENOL) 500 MG tablet Take 2 tablets (1,000 mg total) by mouth every 8 (eight) hours.  . ARIPiprazole (ABILIFY) 15 MG tablet Take 0.5 tablets (7.5 mg total) by mouth daily.  . B Complex-C-Folic Acid (RENAL VITAMIN) 0.8 MG TABS Take 1 tablet by mouth daily.  . Blood Pressure Monitoring (BLOOD PRESSURE MONITOR 7) DEVI 1 Units by Does not apply route daily. Measure blood pressure daily  . calcitRIOL (ROCALTROL) 0.25 MCG capsule Take 0.25 mcg by mouth See admin instructions. 0.57mcg daily on dialysis days - Tuesday's,Thursday's, and Saturday's  . calcium acetate (PHOSLO) 667 MG capsule Take 667 mg by mouth 3 (three) times daily.  . Darbepoetin Alfa (ARANESP) 150 MCG/0.3ML SOSY injection Inject 0.3 mLs (150 mcg total) into the vein every Saturday with hemodialysis.  Marland Kitchen diclofenac Sodium (VOLTAREN) 1 % GEL Apply 4 g topically 4 (four) times daily as needed (Pain to L wrist and R knee).  Marland Kitchen diphenhydrAMINE-zinc acetate (BENADRYL) cream Apply 1 application. topically 2 (two) times daily as needed for itching.  . gabapentin (NEURONTIN) 300 MG capsule Take 1 capsule (300 mg total) by mouth at bedtime.  .  hydrocerin (EUCERIN) CREA Apply 1 application. topically 2 (two) times daily.  . hydrOXYzine (VISTARIL) 25 MG capsule Take 1 capsule (25 mg total) by mouth every 8 (eight) hours as needed. (Patient taking differently: Take 25 mg by mouth every 8 (eight) hours as needed for anxiety.)  . lidocaine-prilocaine (EMLA) cream Apply 1 application topically daily as needed (port site access on dialysis days (Tuesday's, Thursday's, and Saturday's).  . losartan (COZAAR) 50 MG tablet Take 50 mg by mouth daily.  . methocarbamol (ROBAXIN) 500 MG tablet Take 2 tablets (1,000 mg total) by mouth at bedtime.  Marland Kitchen omeprazole (PRILOSEC) 20 MG capsule Take 1 capsule (20 mg total) by mouth daily.  . polyethylene glycol (MIRALAX / GLYCOLAX) 17 g packet Take 17 g by mouth 2 (two) times daily.  . ramelteon (ROZEREM) 8 MG tablet Take 1 tablet (8 mg total) by mouth at bedtime.  . senna-docusate (SENOKOT-S) 8.6-50 MG tablet Take 2 tablets by mouth 2 (two) times daily.  . sevelamer carbonate (RENVELA) 800 MG tablet Take 2 tablets (1,600 mg total) by mouth 3 (three) times daily with meals.   No facility-administered medications prior to visit.    ROS        Objective:     There were no  vitals taken for this visit. {Vitals History (Optional):23777}  Physical Exam   No results found for any visits on 03/23/22. {Show previous labs (optional):23779}    Assessment & Plan:    Routine Health Maintenance and Physical Exam  Immunization History  Administered Date(s) Administered  . Hepb-cpg 08/09/2021  . Influenza,inj,Quad PF,6+ Mos 09/15/2017  . Tdap 09/15/2017, 07/27/2021    Health Maintenance  Topic Date Due  . PAP-Cervical Cytology Screening  04/01/2021  . PAP SMEAR-Modifier  04/01/2021  . TETANUS/TDAP  07/28/2031  . Hepatitis C Screening  Completed  . HIV Screening  Completed  . HPV VACCINES  Aged Out  . INFLUENZA VACCINE  Discontinued  . COVID-19 Vaccine  Discontinued    Discussed health benefits  of physical activity, and encouraged her to engage in regular exercise appropriate for her age and condition.  Problem List Items Addressed This Visit   None  No follow-ups on file.     Asencion Noble, MD

## 2022-03-23 ENCOUNTER — Ambulatory Visit: Payer: Medicaid Other | Attending: Critical Care Medicine | Admitting: Critical Care Medicine

## 2022-03-25 NOTE — Telephone Encounter (Signed)
Last rx was sent on 08/11/21 when pt was seen. Pt has an upcoming appt on 5/8 ?

## 2022-03-26 ENCOUNTER — Telehealth: Payer: Self-pay

## 2022-03-26 NOTE — Telephone Encounter (Signed)
Left patient a voice mail to call back to reschedule missed appointment with Dr. Tommy Medal. ?

## 2022-03-31 ENCOUNTER — Other Ambulatory Visit: Payer: Self-pay | Admitting: Critical Care Medicine

## 2022-04-04 NOTE — Progress Notes (Deleted)
Complete physical exam  Patient: Natasha Long   DOB: Feb 20, 1996   26 y.o. Female  MRN: [id number]  Subjective:    No chief complaint on file.  Pap Pricella Hott is a 26 y.o. female who presents today for a complete physical exam. She reports consuming a {diet types:17450} diet. {types:19826} She generally feels {DESC; WELL/FAIRLY WELL/POORLY:18703}. She reports sleeping {DESC; WELL/FAIRLY WELL/POORLY:18703}. She {does/does not:200015} have additional problems to discuss today.    Most recent fall risk assessment:    08/11/2021    3:14 PM  [place] in the past year? 1  Number falls in past yr: 1  Injury with Fall? 0     Most recent depression screenings:    08/11/2021    3:14 PM 12/30/2020   12:27 PM  PHQ 2/9 Scores  PHQ - 2 Score 6   PHQ- 9 Score 21      Information is confidential and restricted. Go to Review Flowsheets to unlock data.    {VISON DENTAL STD PSA (Optional):27386}  {History (Optional):23778}  Patient Care Team: [name], MD as PCP - General (Pulmonary Disease)   Outpatient Medications Prior to Visit  Medication Sig   acetaminophen (TYLENOL) 500 MG tablet Take 2 tablets (1,000 mg total) by mouth every 8 (eight) hours.   ARIPiprazole (ABILIFY) 15 MG tablet Take 0.5 tablets (7.5 mg total) by mouth daily.   B Complex-C-Folic Acid (RENAL VITAMIN) 0.8 MG TABS Take 1 tablet by mouth daily.   Blood Pressure Monitoring (BLOOD PRESSURE MONITOR 7) DEVI 1 Units by Does not apply route daily. Measure blood pressure daily   calcitRIOL (ROCALTROL) 0.25 MCG capsule Take 0.25 mcg by mouth See admin instructions. 0.30mcg daily on dialysis days - Tuesday's,Thursday's, and Saturday's   calcium acetate (PHOSLO) 667 MG capsule Take 667 mg by mouth 3 (three) times daily.   Darbepoetin Alfa (ARANESP) 150 MCG/0.3ML SOSY injection Inject 0.3 mLs (150 mcg total) into the vein every Saturday with hemodialysis.   diclofenac Sodium (VOLTAREN) 1 % GEL Apply  4 g topically 4 (four) times daily as needed (Pain to L wrist and R knee).   diphenhydrAMINE-zinc acetate (BENADRYL) cream Apply 1 application. topically 2 (two) times daily as needed for itching.   gabapentin (NEURONTIN) 300 MG capsule Take 1 capsule (300 mg total) by mouth at bedtime.   hydrocerin (EUCERIN) CREA Apply 1 application. topically 2 (two) times daily.   hydrOXYzine (VISTARIL) 25 MG capsule Take 1 capsule (25 mg total) by mouth every 8 (eight) hours as needed for anxiety.   lidocaine-prilocaine (EMLA) cream Apply 1 application topically daily as needed (port site access on dialysis days (Tuesday's, Thursday's, and Saturday's).   losartan (COZAAR) 50 MG tablet Take 50 mg by mouth daily.   methocarbamol (ROBAXIN) 500 MG tablet Take 2 tablets (1,000 mg total) by mouth at bedtime.   omeprazole (PRILOSEC) 20 MG capsule TAKE 1 CAPSULE BY MOUTH EVERY DAY   polyethylene glycol (MIRALAX / GLYCOLAX) 17 g packet Take 17 g by mouth 2 (two) times daily.   ramelteon (ROZEREM) 8 MG tablet Take 1 tablet (8 mg total) by mouth at bedtime.   senna-docusate (SENOKOT-S) 8.6-50 MG tablet Take 2 tablets by mouth 2 (two) times daily.   sevelamer carbonate (RENVELA) 800 MG tablet Take 2 tablets (1,600 mg total) by mouth 3 (three) times daily with meals.   No facility-administered medications prior to visit.    ROS        Objective:  There were no vitals taken for this visit. {Vitals History (Optional):23777}  Physical Exam   No results found for any visits on 04/06/22. {Show previous labs (optional):23779}    Assessment & Plan:    Routine Health Maintenance and Physical Exam  Immunization History  Administered Date(s) Administered   Hepb-cpg 08/09/2021   Influenza,inj,Quad PF,6+ Mos 09/15/2017   Tdap 09/15/2017, 07/27/2021    Health Maintenance  Topic Date Due   PAP-Cervical Cytology Screening  04/01/2021   PAP SMEAR-Modifier  04/01/2021   TETANUS/TDAP  07/28/2031   Hepatitis  C Screening  Completed   HIV Screening  Completed   HPV VACCINES  Aged Out   INFLUENZA VACCINE  Discontinued   COVID-19 Vaccine  Discontinued    Discussed health benefits of physical activity, and encouraged her to engage in regular exercise appropriate for her age and condition.  Problem List Items Addressed This Visit   None  No follow-ups on file.     Asencion Noble, MD

## 2022-04-06 ENCOUNTER — Encounter: Payer: Medicaid Other | Admitting: Critical Care Medicine

## 2022-04-10 ENCOUNTER — Ambulatory Visit: Payer: Self-pay | Admitting: *Deleted

## 2022-04-10 NOTE — Telephone Encounter (Signed)
?  Chief Complaint: yellow vaginal discharge with an odor ?Symptoms: Has a smell.   ?Frequency: Started yesterday 5/11 ?Pertinent Negatives: Patient denies trying OTC medications for vaginal symptoms ?Disposition: [] ED /[x] Urgent Care (no appt availability in office) / [] Appointment(In office/virtual)/ []  [facility]/ [] Home Care/ [] Refused Recommended Disposition /[] [facility]/ []  Follow-up with PCP ?Additional Notes: Agreeable to go ing to the urgent care since no appts  ?

## 2022-04-10 NOTE — Telephone Encounter (Signed)
I returned pt's call.   C/o having a yellow vaginal discharge that smells weird.  Started on 04/09/2022.   Requesting to be seen immediately. ? ? ?Reason for Disposition ? Bad smelling vaginal discharge ? ?Answer Assessment - Initial Assessment Questions ?1. DISCHARGE: "Describe the discharge." (e.g., white, yellow, green, gray, foamy, cottage cheese-like) ?    Having a yellow vaginal discharge that smells.   Also have a sore throat and coughing yellow mucus.       ?2. ODOR: "Is there a bad odor?" ?    Strong yeast smell.    I've had these before.   She has not tried any OTC medications for vaginal symptoms. ?3. ONSET: "When did the discharge begin?" ?    04/09/2022 Yesterday ? ?I can't go to urgent care because I don't have my ID and Medicaid card.   I use a walker and I'm in a lot of pain.    ?I had knee surgery reason using a walker. ? ?She didn't want to go to the ED.   She was agreeable to going to the [facility] in front of [facility].   "I live [place] the [address] from [facility]".   ?4. RASH: "Is there a rash in that area?" If Yes, ask: "Describe it." (e.g., redness, blisters, sores, bumps) ?    Not asked ?5. ABDOMINAL PAIN: "Are you having any abdominal pain?" If Yes, ask: "What does it feel like? " (e.g., crampy, dull, intermittent, constant)  ?    No cramping.  ?6. ABDOMINAL PAIN SEVERITY: If present, ask: "How bad is it?"  (e.g., mild, moderate, severe) ? - MILD - doesn't interfere with normal activities  ? - MODERATE - interferes with normal activities or awakens from sleep  ? - SEVERE - patient doesn't want to move (R/O peritonitis)  ?    N/A ?7. CAUSE: "What do you think is causing the discharge?" "Have you had the same problem before? What happened then?" ?    Maybe a STD but not sure ?8. OTHER SYMPTOMS: "Do you have any other symptoms?" (e.g., fever, itching, vaginal bleeding, pain with urination, injury to genital area, vaginal foreign body) ?    Denies itching. ?9. PREGNANCY: "Is  there any chance you are pregnant?" "When was your last menstrual period?" ?    Not asked ? ?Protocols used: Vaginal Discharge-A-AH ? ?

## 2022-05-01 ENCOUNTER — Telehealth: Payer: Self-pay | Admitting: Critical Care Medicine

## 2022-05-01 NOTE — Telephone Encounter (Signed)
Paperwork was faxed this morning 

## 2022-05-01 NOTE — Telephone Encounter (Signed)
Copied from [place] [id number]. Topic: General - Other >> May 01, 2022  9:52 AM Yvette Rack wrote: Reason for CRM: Merry Proud with [employer] called for update on the certificate for medical necessity that was faxed twice. Cb# [phone]

## 2022-05-02 ENCOUNTER — Inpatient Hospital Stay (HOSPITAL_COMMUNITY)
Admission: EM | Admit: 2022-05-02 | Discharge: 2022-05-07 | DRG: 070 | Disposition: A | Discharge status: Home / Self Care | Payer: Medicaid Other | Attending: Internal Medicine | Admitting: Internal Medicine

## 2022-05-02 ENCOUNTER — Other Ambulatory Visit: Payer: Self-pay

## 2022-05-02 DIAGNOSIS — M25532 Pain in left wrist: Secondary | ICD-10-CM | POA: Diagnosis present

## 2022-05-02 DIAGNOSIS — G9349 Other encephalopathy: Principal | ICD-10-CM | POA: Diagnosis present

## 2022-05-02 DIAGNOSIS — N186 End stage renal disease: Secondary | ICD-10-CM | POA: Diagnosis present

## 2022-05-02 DIAGNOSIS — D631 Anemia in chronic kidney disease: Secondary | ICD-10-CM | POA: Diagnosis present

## 2022-05-02 DIAGNOSIS — D649 Anemia, unspecified: Secondary | ICD-10-CM | POA: Diagnosis present

## 2022-05-02 DIAGNOSIS — M898X9 Other specified disorders of bone, unspecified site: Secondary | ICD-10-CM | POA: Diagnosis present

## 2022-05-02 DIAGNOSIS — K130 Diseases of lips: Secondary | ICD-10-CM | POA: Diagnosis present

## 2022-05-02 DIAGNOSIS — Z91158 Patient's noncompliance with renal dialysis for other reason: Secondary | ICD-10-CM

## 2022-05-02 DIAGNOSIS — N19 Unspecified kidney failure: Secondary | ICD-10-CM

## 2022-05-02 DIAGNOSIS — I5032 Chronic diastolic (congestive) heart failure: Secondary | ICD-10-CM | POA: Diagnosis present

## 2022-05-02 DIAGNOSIS — Z992 Dependence on renal dialysis: Secondary | ICD-10-CM

## 2022-05-02 DIAGNOSIS — Z5320 Procedure and treatment not carried out because of patient's decision for unspecified reasons: Secondary | ICD-10-CM | POA: Diagnosis not present

## 2022-05-02 DIAGNOSIS — Z9151 Personal history of suicidal behavior: Secondary | ICD-10-CM

## 2022-05-02 DIAGNOSIS — K219 Gastro-esophageal reflux disease without esophagitis: Secondary | ICD-10-CM | POA: Diagnosis present

## 2022-05-02 DIAGNOSIS — Z87441 Personal history of nephrotic syndrome: Secondary | ICD-10-CM

## 2022-05-02 DIAGNOSIS — F1414 Cocaine abuse with cocaine-induced mood disorder: Secondary | ICD-10-CM | POA: Diagnosis present

## 2022-05-02 DIAGNOSIS — N189 Chronic kidney disease, unspecified: Secondary | ICD-10-CM

## 2022-05-02 DIAGNOSIS — F1290 Cannabis use, unspecified, uncomplicated: Secondary | ICD-10-CM | POA: Diagnosis present

## 2022-05-02 DIAGNOSIS — Z79899 Other long term (current) drug therapy: Secondary | ICD-10-CM

## 2022-05-02 DIAGNOSIS — E872 Acidosis, unspecified: Secondary | ICD-10-CM | POA: Diagnosis present

## 2022-05-02 DIAGNOSIS — I3139 Other pericardial effusion (noninflammatory): Secondary | ICD-10-CM | POA: Diagnosis present

## 2022-05-02 DIAGNOSIS — B001 Herpesviral vesicular dermatitis: Secondary | ICD-10-CM | POA: Diagnosis present

## 2022-05-02 DIAGNOSIS — R799 Abnormal finding of blood chemistry, unspecified: Secondary | ICD-10-CM

## 2022-05-02 DIAGNOSIS — R4182 Altered mental status, unspecified: Secondary | ICD-10-CM

## 2022-05-02 DIAGNOSIS — F17210 Nicotine dependence, cigarettes, uncomplicated: Secondary | ICD-10-CM | POA: Diagnosis present

## 2022-05-02 DIAGNOSIS — Z833 Family history of diabetes mellitus: Secondary | ICD-10-CM

## 2022-05-02 DIAGNOSIS — E669 Obesity, unspecified: Secondary | ICD-10-CM | POA: Diagnosis present

## 2022-05-02 DIAGNOSIS — Z8249 Family history of ischemic heart disease and other diseases of the circulatory system: Secondary | ICD-10-CM

## 2022-05-02 DIAGNOSIS — Z9049 Acquired absence of other specified parts of digestive tract: Secondary | ICD-10-CM

## 2022-05-02 DIAGNOSIS — Z5900 Homelessness unspecified: Secondary | ICD-10-CM

## 2022-05-02 DIAGNOSIS — F4310 Post-traumatic stress disorder, unspecified: Secondary | ICD-10-CM | POA: Diagnosis present

## 2022-05-02 DIAGNOSIS — R031 Nonspecific low blood-pressure reading: Secondary | ICD-10-CM | POA: Diagnosis not present

## 2022-05-02 DIAGNOSIS — G9341 Metabolic encephalopathy: Secondary | ICD-10-CM | POA: Diagnosis present

## 2022-05-02 DIAGNOSIS — Z6831 Body mass index (BMI) 31.0-31.9, adult: Secondary | ICD-10-CM

## 2022-05-02 DIAGNOSIS — Z888 Allergy status to other drugs, medicaments and biological substances status: Secondary | ICD-10-CM

## 2022-05-02 DIAGNOSIS — F603 Borderline personality disorder: Secondary | ICD-10-CM | POA: Diagnosis present

## 2022-05-02 DIAGNOSIS — G629 Polyneuropathy, unspecified: Secondary | ICD-10-CM | POA: Diagnosis present

## 2022-05-02 DIAGNOSIS — E162 Hypoglycemia, unspecified: Secondary | ICD-10-CM | POA: Diagnosis present

## 2022-05-02 DIAGNOSIS — I10 Essential (primary) hypertension: Secondary | ICD-10-CM | POA: Diagnosis present

## 2022-05-02 DIAGNOSIS — B9562 Methicillin resistant Staphylococcus aureus infection as the cause of diseases classified elsewhere: Secondary | ICD-10-CM | POA: Diagnosis present

## 2022-05-02 DIAGNOSIS — I160 Hypertensive urgency: Secondary | ICD-10-CM | POA: Diagnosis present

## 2022-05-02 DIAGNOSIS — M25572 Pain in left ankle and joints of left foot: Secondary | ICD-10-CM | POA: Diagnosis not present

## 2022-05-02 DIAGNOSIS — I132 Hypertensive heart and chronic kidney disease with heart failure and with stage 5 chronic kidney disease, or end stage renal disease: Secondary | ICD-10-CM | POA: Diagnosis present

## 2022-05-02 DIAGNOSIS — F1494 Cocaine use, unspecified with cocaine-induced mood disorder: Secondary | ICD-10-CM | POA: Diagnosis present

## 2022-05-02 DIAGNOSIS — F411 Generalized anxiety disorder: Secondary | ICD-10-CM | POA: Diagnosis present

## 2022-05-02 DIAGNOSIS — N898 Other specified noninflammatory disorders of vagina: Secondary | ICD-10-CM

## 2022-05-02 LAB — CBC
HCT: 34 % — ABNORMAL LOW (ref 36.0–46.0)
Hemoglobin: 10.7 g/dL — ABNORMAL LOW (ref 12.0–15.0)
MCH: 30.3 pg (ref 26.0–34.0)
MCHC: 31.5 g/dL (ref 30.0–36.0)
MCV: 96.3 fL (ref 80.0–100.0)
Platelets: 171 10*3/uL (ref 150–400)
RBC: 3.53 MIL/uL — ABNORMAL LOW (ref 3.87–5.11)
RDW: 15 % (ref 11.5–15.5)
WBC: 7.4 10*3/uL (ref 4.0–10.5)
nRBC: 0 % (ref 0.0–0.2)

## 2022-05-02 LAB — BASIC METABOLIC PANEL
Anion gap: 22 — ABNORMAL HIGH (ref 5–15)
BUN: 112 mg/dL — ABNORMAL HIGH (ref 6–20)
CO2: 15 mmol/L — ABNORMAL LOW (ref 22–32)
Calcium: 7.8 mg/dL — ABNORMAL LOW (ref 8.9–10.3)
Chloride: 99 mmol/L (ref 98–111)
Creatinine, Ser: 29.68 mg/dL — ABNORMAL HIGH (ref 0.44–1.00)
GFR, Estimated: 1 mL/min — ABNORMAL LOW (ref >60)
Glucose, Bld: 94 mg/dL (ref 70–99)
Potassium: 4.9 mmol/L (ref 3.5–5.1)
Sodium: 136 mmol/L (ref 135–145)

## 2022-05-02 LAB — I-STAT BETA HCG BLOOD, ED (MC, WL, AP ONLY): I-stat hCG, quantitative: <5 m[IU]/mL (ref <5)

## 2022-05-02 LAB — CBG MONITORING, ED: Glucose-Capillary: 99 mg/dL (ref 70–99)

## 2022-05-02 NOTE — ED Triage Notes (Signed)
Pt presents via EMS with complaints of pain up and down her right side, reports recent "bone infection"  CBG 63 on arrival. She drank a soda and EMS recheck was 98.  Has not been to dialysis x 2 weeks because "she didn't feel well"  Has headache and feels nauseated.
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
## 2022-05-03 ENCOUNTER — Inpatient Hospital Stay (HOSPITAL_COMMUNITY): Payer: Medicaid Other

## 2022-05-03 ENCOUNTER — Emergency Department (HOSPITAL_COMMUNITY): Payer: Medicaid Other

## 2022-05-03 DIAGNOSIS — E872 Acidosis, unspecified: Secondary | ICD-10-CM | POA: Diagnosis present

## 2022-05-03 DIAGNOSIS — I3139 Other pericardial effusion (noninflammatory): Secondary | ICD-10-CM | POA: Diagnosis present

## 2022-05-03 DIAGNOSIS — Z5900 Homelessness unspecified: Secondary | ICD-10-CM | POA: Diagnosis not present

## 2022-05-03 DIAGNOSIS — F411 Generalized anxiety disorder: Secondary | ICD-10-CM | POA: Diagnosis present

## 2022-05-03 DIAGNOSIS — F1494 Cocaine use, unspecified with cocaine-induced mood disorder: Secondary | ICD-10-CM | POA: Diagnosis not present

## 2022-05-03 DIAGNOSIS — E162 Hypoglycemia, unspecified: Secondary | ICD-10-CM | POA: Diagnosis present

## 2022-05-03 DIAGNOSIS — I132 Hypertensive heart and chronic kidney disease with heart failure and with stage 5 chronic kidney disease, or end stage renal disease: Secondary | ICD-10-CM | POA: Diagnosis present

## 2022-05-03 DIAGNOSIS — M25572 Pain in left ankle and joints of left foot: Secondary | ICD-10-CM | POA: Diagnosis not present

## 2022-05-03 DIAGNOSIS — I160 Hypertensive urgency: Secondary | ICD-10-CM | POA: Diagnosis present

## 2022-05-03 DIAGNOSIS — R4182 Altered mental status, unspecified: Secondary | ICD-10-CM | POA: Diagnosis present

## 2022-05-03 DIAGNOSIS — K130 Diseases of lips: Secondary | ICD-10-CM | POA: Diagnosis present

## 2022-05-03 DIAGNOSIS — D631 Anemia in chronic kidney disease: Secondary | ICD-10-CM | POA: Diagnosis present

## 2022-05-03 DIAGNOSIS — F4310 Post-traumatic stress disorder, unspecified: Secondary | ICD-10-CM | POA: Diagnosis present

## 2022-05-03 DIAGNOSIS — Z992 Dependence on renal dialysis: Secondary | ICD-10-CM | POA: Diagnosis not present

## 2022-05-03 DIAGNOSIS — N19 Unspecified kidney failure: Secondary | ICD-10-CM | POA: Diagnosis not present

## 2022-05-03 DIAGNOSIS — M898X9 Other specified disorders of bone, unspecified site: Secondary | ICD-10-CM | POA: Diagnosis present

## 2022-05-03 DIAGNOSIS — F1414 Cocaine abuse with cocaine-induced mood disorder: Secondary | ICD-10-CM | POA: Diagnosis present

## 2022-05-03 DIAGNOSIS — Z5320 Procedure and treatment not carried out because of patient's decision for unspecified reasons: Secondary | ICD-10-CM | POA: Diagnosis not present

## 2022-05-03 DIAGNOSIS — F603 Borderline personality disorder: Secondary | ICD-10-CM | POA: Diagnosis present

## 2022-05-03 DIAGNOSIS — G9349 Other encephalopathy: Secondary | ICD-10-CM | POA: Diagnosis present

## 2022-05-03 DIAGNOSIS — N186 End stage renal disease: Secondary | ICD-10-CM | POA: Diagnosis present

## 2022-05-03 DIAGNOSIS — I5032 Chronic diastolic (congestive) heart failure: Secondary | ICD-10-CM | POA: Diagnosis present

## 2022-05-03 DIAGNOSIS — K219 Gastro-esophageal reflux disease without esophagitis: Secondary | ICD-10-CM | POA: Diagnosis present

## 2022-05-03 DIAGNOSIS — G9341 Metabolic encephalopathy: Secondary | ICD-10-CM | POA: Diagnosis present

## 2022-05-03 DIAGNOSIS — F17210 Nicotine dependence, cigarettes, uncomplicated: Secondary | ICD-10-CM | POA: Diagnosis present

## 2022-05-03 DIAGNOSIS — E669 Obesity, unspecified: Secondary | ICD-10-CM | POA: Diagnosis present

## 2022-05-03 DIAGNOSIS — N898 Other specified noninflammatory disorders of vagina: Secondary | ICD-10-CM | POA: Diagnosis not present

## 2022-05-03 DIAGNOSIS — B9562 Methicillin resistant Staphylococcus aureus infection as the cause of diseases classified elsewhere: Secondary | ICD-10-CM | POA: Diagnosis present

## 2022-05-03 LAB — CBC WITH DIFFERENTIAL/PLATELET
Abs Immature Granulocytes: 0.02 10*3/uL (ref 0.00–0.07)
Basophils Absolute: 0 10*3/uL (ref 0.0–0.1)
Basophils Relative: 1 %
Eosinophils Absolute: 0.2 10*3/uL (ref 0.0–0.5)
Eosinophils Relative: 3 %
HCT: 31.5 % — ABNORMAL LOW (ref 36.0–46.0)
Hemoglobin: 10 g/dL — ABNORMAL LOW (ref 12.0–15.0)
Immature Granulocytes: 0 %
Lymphocytes Relative: 22 %
Lymphs Abs: 1.4 10*3/uL (ref 0.7–4.0)
MCH: 30.2 pg (ref 26.0–34.0)
MCHC: 31.7 g/dL (ref 30.0–36.0)
MCV: 95.2 fL (ref 80.0–100.0)
Monocytes Absolute: 0.6 10*3/uL (ref 0.1–1.0)
Monocytes Relative: 9 %
Neutro Abs: 4.1 10*3/uL (ref 1.7–7.7)
Neutrophils Relative %: 65 %
Platelets: 144 10*3/uL — ABNORMAL LOW (ref 150–400)
RBC: 3.31 MIL/uL — ABNORMAL LOW (ref 3.87–5.11)
RDW: 14.9 % (ref 11.5–15.5)
WBC: 6.3 10*3/uL (ref 4.0–10.5)
nRBC: 0 % (ref 0.0–0.2)

## 2022-05-03 LAB — RENAL FUNCTION PANEL
Albumin: 3 g/dL — ABNORMAL LOW (ref 3.5–5.0)
Anion gap: 23 — ABNORMAL HIGH (ref 5–15)
BUN: 112 mg/dL — ABNORMAL HIGH (ref 6–20)
CO2: 14 mmol/L — ABNORMAL LOW (ref 22–32)
Calcium: 7.7 mg/dL — ABNORMAL LOW (ref 8.9–10.3)
Chloride: 101 mmol/L (ref 98–111)
Creatinine, Ser: 29.67 mg/dL — ABNORMAL HIGH (ref 0.44–1.00)
GFR, Estimated: 1 mL/min — ABNORMAL LOW (ref >60)
Glucose, Bld: 91 mg/dL (ref 70–99)
Phosphorus: >30 mg/dL — ABNORMAL HIGH (ref 2.5–4.6)
Potassium: 4.8 mmol/L (ref 3.5–5.1)
Sodium: 138 mmol/L (ref 135–145)

## 2022-05-03 LAB — I-STAT VENOUS BLOOD GAS, ED
Acid-base deficit: 11 mmol/L — ABNORMAL HIGH (ref 0.0–2.0)
Bicarbonate: 14.8 mmol/L — ABNORMAL LOW (ref 20.0–28.0)
Calcium, Ion: 0.9 mmol/L — ABNORMAL LOW (ref 1.15–1.40)
HCT: 32 % — ABNORMAL LOW (ref 36.0–46.0)
Hemoglobin: 10.9 g/dL — ABNORMAL LOW (ref 12.0–15.0)
O2 Saturation: 83 %
Potassium: 4.8 mmol/L (ref 3.5–5.1)
Sodium: 138 mmol/L (ref 135–145)
TCO2: 16 mmol/L — ABNORMAL LOW (ref 22–32)
pCO2, Ven: 32.6 mmHg — ABNORMAL LOW (ref 44–60)
pH, Ven: 7.266 (ref 7.25–7.43)
pO2, Ven: 54 mmHg — ABNORMAL HIGH (ref 32–45)

## 2022-05-03 LAB — RAPID URINE DRUG SCREEN, HOSP PERFORMED
Amphetamines: NOT DETECTED
Barbiturates: NOT DETECTED
Benzodiazepines: NOT DETECTED
Cocaine: POSITIVE — AB
Opiates: NOT DETECTED
Tetrahydrocannabinol: POSITIVE — AB

## 2022-05-03 LAB — HEPATITIS B SURFACE ANTIBODY,QUALITATIVE: Hep B S Ab: REACTIVE — AB

## 2022-05-03 LAB — HEPATITIS B CORE ANTIBODY, TOTAL: Hep B Core Total Ab: NONREACTIVE

## 2022-05-03 LAB — ECHOCARDIOGRAM LIMITED

## 2022-05-03 LAB — MAGNESIUM: Magnesium: 2.6 mg/dL — ABNORMAL HIGH (ref 1.7–2.4)

## 2022-05-03 LAB — AMMONIA: Ammonia: 23 umol/L (ref 9–35)

## 2022-05-03 LAB — HEPATITIS C ANTIBODY: HCV Ab: NONREACTIVE

## 2022-05-03 LAB — HEPATITIS B SURFACE ANTIGEN: Hepatitis B Surface Ag: NONREACTIVE

## 2022-05-03 MED ORDER — POLYETHYLENE GLYCOL 3350 17 G PO PACK: 17.0000 g | PACK | Freq: Every day | ORAL | Status: DC | PRN

## 2022-05-03 MED ORDER — HYDRALAZINE HCL 20 MG/ML IJ SOLN
5.0000 mg | Freq: Four times a day (QID) | INTRAMUSCULAR | Status: DC | PRN
  Administered 2022-05-03: 5 mg via INTRAVENOUS
  Filled 2022-05-03: qty 1

## 2022-05-03 MED ORDER — HYDROXYZINE HCL 25 MG PO TABS
25.0000 mg | ORAL_TABLET | Freq: Three times a day (TID) | ORAL | Status: DC | PRN
  Administered 2022-05-04 – 2022-05-06 (×2): 25 mg via ORAL
  Filled 2022-05-03 (×2): qty 1

## 2022-05-03 MED ORDER — OXYCODONE HCL 5 MG PO TABS
5.0000 mg | ORAL_TABLET | Freq: Four times a day (QID) | ORAL | Status: AC | PRN
  Administered 2022-05-03 (×2): 5 mg via ORAL
  Filled 2022-05-03 (×2): qty 1

## 2022-05-03 MED ORDER — CHLORHEXIDINE GLUCONATE CLOTH 2 % EX PADS: 6.0000 | MEDICATED_PAD | Freq: Every day | CUTANEOUS | Status: DC

## 2022-05-03 MED ORDER — AMLODIPINE BESYLATE 10 MG PO TABS
10.0000 mg | ORAL_TABLET | Freq: Every day | ORAL | Status: DC
  Administered 2022-05-04 – 2022-05-07 (×4): 10 mg via ORAL
  Filled 2022-05-03 (×4): qty 1

## 2022-05-03 MED ORDER — LIP MEDEX EX OINT
TOPICAL_OINTMENT | CUTANEOUS | Status: DC | PRN
  Administered 2022-05-04: 75 via TOPICAL
  Filled 2022-05-03: qty 7

## 2022-05-03 MED ORDER — AMLODIPINE BESYLATE 5 MG PO TABS
2.5000 mg | ORAL_TABLET | Freq: Two times a day (BID) | ORAL | Status: DC
  Administered 2022-05-03: 2.5 mg via ORAL
  Filled 2022-05-03: qty 1

## 2022-05-03 MED ORDER — SODIUM CHLORIDE 0.9 % IV SOLN
2.0000 g | Freq: Every day | INTRAVENOUS | Status: DC
  Administered 2022-05-03 – 2022-05-06 (×4): 2 g via INTRAVENOUS
  Filled 2022-05-03 (×4): qty 2000

## 2022-05-03 MED ORDER — OXYCODONE HCL 5 MG PO TABS
5.0000 mg | ORAL_TABLET | Freq: Once | ORAL | Status: AC | PRN
  Administered 2022-05-04: 5 mg via ORAL
  Filled 2022-05-03: qty 1

## 2022-05-03 MED ORDER — LOSARTAN POTASSIUM 50 MG PO TABS
50.0000 mg | ORAL_TABLET | Freq: Every day | ORAL | Status: DC
  Administered 2022-05-03 – 2022-05-06 (×4): 50 mg via ORAL
  Filled 2022-05-03 (×4): qty 1

## 2022-05-03 MED ORDER — ACETAMINOPHEN 325 MG PO TABS
650.0000 mg | ORAL_TABLET | Freq: Four times a day (QID) | ORAL | Status: DC | PRN
  Administered 2022-05-03 – 2022-05-04 (×3): 650 mg via ORAL
  Filled 2022-05-03 (×3): qty 2

## 2022-05-03 MED ORDER — HYDRALAZINE HCL 20 MG/ML IJ SOLN
10.0000 mg | Freq: Four times a day (QID) | INTRAMUSCULAR | Status: DC | PRN
  Administered 2022-05-04 – 2022-05-05 (×5): 10 mg via INTRAVENOUS
  Filled 2022-05-03 (×6): qty 1

## 2022-05-03 MED ORDER — PROCHLORPERAZINE EDISYLATE 10 MG/2ML IJ SOLN: 5.0000 mg | Freq: Four times a day (QID) | INTRAMUSCULAR | Status: DC | PRN

## 2022-05-03 MED ORDER — HYDROMORPHONE HCL 1 MG/ML IJ SOLN: 0.5000 mg | INTRAMUSCULAR | Status: DC | PRN

## 2022-05-03 MED ORDER — CALCIUM ACETATE (PHOS BINDER) 667 MG PO CAPS
667.0000 mg | ORAL_CAPSULE | Freq: Three times a day (TID) | ORAL | Status: DC
  Administered 2022-05-03 – 2022-05-06 (×6): 667 mg via ORAL
  Filled 2022-05-03 (×6): qty 1

## 2022-05-03 MED ORDER — PANTOPRAZOLE SODIUM 40 MG PO TBEC
40.0000 mg | DELAYED_RELEASE_TABLET | Freq: Every day | ORAL | Status: DC
  Administered 2022-05-03 – 2022-05-07 (×5): 40 mg via ORAL
  Filled 2022-05-03 (×5): qty 1

## 2022-05-03 MED ORDER — CARVEDILOL 6.25 MG PO TABS
6.2500 mg | ORAL_TABLET | Freq: Two times a day (BID) | ORAL | Status: DC
  Administered 2022-05-03: 6.25 mg via ORAL
  Filled 2022-05-03: qty 2

## 2022-05-03 MED ORDER — TORSEMIDE 20 MG PO TABS
40.0000 mg | ORAL_TABLET | Freq: Every day | ORAL | Status: DC
  Administered 2022-05-03 – 2022-05-07 (×5): 40 mg via ORAL
  Filled 2022-05-03 (×5): qty 2

## 2022-05-03 MED ORDER — ARIPIPRAZOLE 5 MG PO TABS
7.5000 mg | ORAL_TABLET | Freq: Every day | ORAL | Status: DC
  Administered 2022-05-03 – 2022-05-07 (×5): 7.5 mg via ORAL
  Filled 2022-05-03 (×6): qty 2

## 2022-05-03 NOTE — ED Notes (Signed)
Attempted x2 to stick for cultures and was unsuccessful. RN notified
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
## 2022-05-03 NOTE — Progress Notes (Signed)
Echocardiogram 2D Echocardiogram has been performed.  Oneal Deputy Montario Zilka RDCS 05/03/2022, 9:27 AM

## 2022-05-03 NOTE — ED Notes (Signed)
Pt noted to be sleeping soundly.  This writer had to wake Pt to give medications.  [name] lips noted to be crusted over and weeping in spots.
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
## 2022-05-03 NOTE — ED Notes (Signed)
Pt given tylenol for headache and informed of diet change.  Pt will to having monitoring equipment reapplied.

## 2022-05-03 NOTE — ED Notes (Signed)
Pt continues to refuse monitoring.  Pt upset about diet.  Pt informed the Hospitalist will not be changing the diet right now.  Pt remains upset.

## 2022-05-03 NOTE — ED Notes (Signed)
Pt given sandwich bag and ginger ale per request.

## 2022-05-03 NOTE — ED Notes (Signed)
Pt is refusing to keep cords on as the beeping is affecting her mental status. Did not put cords back on.

## 2022-05-03 NOTE — ED Notes (Signed)
Pt is requesting to take a shower.

## 2022-05-03 NOTE — ED Notes (Signed)
Pt continues to rest comfortably.  Pt went bad to sleep quickly after medication administration.

## 2022-05-03 NOTE — Progress Notes (Signed)
Pt seen and examined earlier this am and admitted by Dr Nevada Crane.  Natasha Long is a 26 y.o. female with medical history significant for ESRD on HD, hypertension, polysubstance abuse including cocaine and THC, History of septic arthritis of multiple joints, history of syphillis/gonorrhea, major depressive disorder/generalized anxiety disorder, medical noncompliance, missed hemodialysis for the past 2 weeks, states she does not like going to hemodialysis, who presented to [facility] ED from home via EMS with several complaints which included headache, left wrist edema and pain, left ankle edema and pain, nausea, chills.  She was admitted for acute uremic and acute metabolic encephalopathy from missing HD.   General exam:young lady, not in distress.  Respiratory system: Clear to auscultation. Respiratory effort normal. Cardiovascular system: S1 & S2 heard, RRR. Mar[place] Kitchen No pedal edema. Gastrointestinal system: Abdomen is nondistended, soft and nontender.  Central nervous system: Alert and oriented. No focal neurological deficits. Extremities: Symmetric 5 x 5 power.   Plan  HD daily.  BP control.    Hosie Poisson MD
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
## 2022-05-03 NOTE — ED Notes (Signed)
Pt transported to ECHO. 

## 2022-05-03 NOTE — ED Notes (Signed)
Natasha Long sister [phone] requesting an update/to speak with the patient

## 2022-05-04 ENCOUNTER — Inpatient Hospital Stay (HOSPITAL_COMMUNITY): Payer: Medicaid Other

## 2022-05-04 DIAGNOSIS — N19 Unspecified kidney failure: Secondary | ICD-10-CM | POA: Diagnosis not present

## 2022-05-04 DIAGNOSIS — R4182 Altered mental status, unspecified: Secondary | ICD-10-CM | POA: Diagnosis not present

## 2022-05-04 LAB — BLOOD CULTURE ID PANEL (REFLEXED) - BCID2

## 2022-05-04 MED ORDER — ANTICOAGULANT SODIUM CITRATE 4% (200MG/5ML) IV SOLN: 5.0000 mL | Status: DC | PRN

## 2022-05-04 MED ORDER — PENTAFLUOROPROP-TETRAFLUOROETH EX AERO: 1.0000 "application " | INHALATION_SPRAY | CUTANEOUS | Status: DC | PRN

## 2022-05-04 MED ORDER — TRAMADOL HCL 50 MG PO TABS
50.0000 mg | ORAL_TABLET | Freq: Once | ORAL | Status: AC
  Administered 2022-05-04: 50 mg via ORAL
  Filled 2022-05-04: qty 1

## 2022-05-04 MED ORDER — HEPARIN SODIUM (PORCINE) 1000 UNIT/ML DIALYSIS: 1000.0000 [IU] | INTRAMUSCULAR | Status: DC | PRN

## 2022-05-04 MED ORDER — ALTEPLASE 2 MG IJ SOLR: 2.0000 mg | Freq: Once | INTRAMUSCULAR | Status: DC | PRN

## 2022-05-04 MED ORDER — HEPARIN SODIUM (PORCINE) 1000 UNIT/ML DIALYSIS: 2500.0000 [IU] | INTRAMUSCULAR | Status: DC | PRN

## 2022-05-04 MED ORDER — LIDOCAINE-PRILOCAINE 2.5-2.5 % EX CREA: 1.0000 "application " | TOPICAL_CREAM | CUTANEOUS | Status: DC | PRN

## 2022-05-04 MED ORDER — TRAMADOL HCL 50 MG PO TABS
50.0000 mg | ORAL_TABLET | Freq: Two times a day (BID) | ORAL | Status: DC | PRN
  Administered 2022-05-04 – 2022-05-06 (×6): 50 mg via ORAL
  Filled 2022-05-04 (×7): qty 1

## 2022-05-04 MED ORDER — LIDOCAINE HCL (PF) 1 % IJ SOLN: 5.0000 mL | INTRAMUSCULAR | Status: DC | PRN

## 2022-05-04 NOTE — Progress Notes (Signed)
PHARMACY - PHYSICIAN COMMUNICATION CRITICAL VALUE ALERT - BLOOD CULTURE IDENTIFICATION (BCID)  Natasha Long is an 26 y.o. female who presented to [facility] on 05/02/2022 with a chief complaint of headache, left wrist edema and pain, left ankle edema and pain, nausea, chills  Assessment:  1 out of 4 blood cultures positive for methicillin resistant staph epi.  Most likely a contaminant.   Name of physician (or Provider) Contacted: Dr. Karleen Hampshire  Current antibiotics: Ampicillin for cellulitis  Changes to prescribed antibiotics recommended:  No changes needed  Results for orders placed or performed during the hospital encounter of 05/02/22  Blood Culture ID Panel (Reflexed) (Collected: 05/03/2022  5:05 AM)  Result Value Ref Range   Enterococcus faecalis NOT DETECTED NOT DETECTED   Enterococcus Faecium NOT DETECTED NOT DETECTED   Listeria monocytogenes NOT DETECTED NOT DETECTED   Staphylococcus species DETECTED (A) NOT DETECTED   Staphylococcus aureus (BCID) NOT DETECTED NOT DETECTED   Staphylococcus epidermidis DETECTED (A) NOT DETECTED   Staphylococcus lugdunensis NOT DETECTED NOT DETECTED   Streptococcus species NOT DETECTED NOT DETECTED   Streptococcus agalactiae NOT DETECTED NOT DETECTED   Streptococcus pneumoniae NOT DETECTED NOT DETECTED   Streptococcus pyogenes NOT DETECTED NOT DETECTED   A.calcoaceticus-baumannii NOT DETECTED NOT DETECTED   Bacteroides fragilis NOT DETECTED NOT DETECTED   Enterobacterales NOT DETECTED NOT DETECTED   Enterobacter cloacae complex NOT DETECTED NOT DETECTED   Escherichia coli NOT DETECTED NOT DETECTED   Klebsiella aerogenes NOT DETECTED NOT DETECTED   Klebsiella oxytoca NOT DETECTED NOT DETECTED   Klebsiella pneumoniae NOT DETECTED NOT DETECTED   Proteus species NOT DETECTED NOT DETECTED   Salmonella species NOT DETECTED NOT DETECTED   Serratia marcescens NOT DETECTED NOT DETECTED   Haemophilus influenzae NOT DETECTED NOT DETECTED   Neisseria  meningitidis NOT DETECTED NOT DETECTED   Pseudomonas aeruginosa NOT DETECTED NOT DETECTED   Stenotrophomonas maltophilia NOT DETECTED NOT DETECTED   Candida albicans NOT DETECTED NOT DETECTED   Candida auris NOT DETECTED NOT DETECTED   Candida glabrata NOT DETECTED NOT DETECTED   Candida krusei NOT DETECTED NOT DETECTED   Candida parapsilosis NOT DETECTED NOT DETECTED   Candida tropicalis NOT DETECTED NOT DETECTED   Cryptococcus neoformans/gattii NOT DETECTED NOT DETECTED   Methicillin resistance mecA/C DETECTED (A) NOT [place] 05/04/2022  6:44 PM

## 2022-05-04 NOTE — Telephone Encounter (Signed)
Called and talked with [place] to see if paperwork was received, he stated he didn't see anything  I re-faxed paperwork and fax number was also verified as well

## 2022-05-04 NOTE — Progress Notes (Signed)
[facility]                                                                               Natasha Long, is a 26 y.o. female, DOB - February 02, 1996, XTK:[id number] Admit date - 05/02/2022    Outpatient Primary MD for the patient is [name], MD  LOS - 1  days    Brief summary   Natasha Long is a 26 y.o. female with medical history significant for ESRD on HD, hypertension, polysubstance abuse including cocaine and THC, History of septic arthritis of multiple joints, history of syphillis/gonorrhea, major depressive disorder/generalized anxiety disorder, medical noncompliance, missed hemodialysis for the past 2 weeks, states she does not like going to hemodialysis, who presented to [facility] ED from home via EMS with several complaints which included headache, left wrist edema and pain, left ankle edema and pain, nausea, chills.   She was admitted for acute uremic and acute metabolic encephalopathy from missing HD.    Assessment & Plan    Assessment and Plan:  Acute metabolic and Uremic encephalopathy: secondary to missing HD and Cocaine and Tetrahydrocannabinol use.  - slowly improving. She is alert and oriented to place and person.  - further management as per Nephrology.  - HD daily.    High AG metabolic acidosis:  Pt refused labs this am.      Lower lip lesions:  ? Chemical burn from smoking cocaine? Pain control.  Follow up cultures and currently on IV antibiotics.     Acute Systolic heart failure:  Volume management as per HD.  Echocardiogram showed moderate pericardial effusion without signs of tamponade.  Left ventricular ejection fraction, s 45 to 50%, has mildly decreased function. Right ventricular systolic function is normal.  Moderate pericardial effusion, measures 1.1 cm adjacent to LV lateral wall. IVC fixed/dilated, but no RA/RV collapse seen to suggest tamponade . Will request cardiology consult in am for further evaluation.  She  currently denies any chest pain or sob. She is on RA.    Hypertensive Urgency:  From missing HD, and non compliance to meds.  Restarted home meds slowly.   Body mass index is 31.44 kg/m. Obesity:     Polysubstance abuse with ongoing THC and Cocaine abuse:  Toc consulted for resources.    Left ankle cellulitis:  X rays do not show any fractures or joint effusions.  Continue with IV antibiotics and follow up blood cultures.    Pt refusing labs, care, medications, non compliant to meds and HD sessions, polysubstance abuse.  Request psychiatry consult to evaluate for depression and drug dependence.      Estimated body mass index is 31.17 kg/m as calculated from the following:   Height as of 03/07/22: 5\' 8"  (1.727 m).   Weight as of 03/07/22: 93 kg.  Code Status: full code.  DVT Prophylaxis:  SCDs Start: 05/03/22 0417   Level of Care: Level of care: Progressive Family Communication: none at bedside.   Disposition Plan:     Remains inpatient appropriate:  IV antibiotics.   Procedures:  HD  Consultants:   Nephrology.  Psychiatry.   Antimicrobials:   Anti-infectives (From admission, onward)  Start     Dose/Rate Route Frequency Ordered Stop   05/03/22 0630  ampicillin (OMNIPEN) 2 g in sodium chloride 0.9 % 100 mL IVPB       Note to Pharmacy: Ampicillin 2 g IV q24h in ESRD on HD   2 g 300 mL/hr over 20 Minutes Intravenous Daily 05/03/22 0531          Medications  Scheduled Meds:  amLODipine  10 mg Oral Daily   ARIPiprazole  7.5 mg Oral Daily   calcium acetate  667 mg Oral TID WC   Chlorhexidine Gluconate Cloth  6 each Topical Q0600   losartan  50 mg Oral Daily   pantoprazole  40 mg Oral Daily   torsemide  40 mg Oral Daily   Continuous Infusions:  ampicillin (OMNIPEN) IV 2 g (05/04/22 1228)   PRN Meds:.acetaminophen, hydrALAZINE, hydrOXYzine, lip balm, polyethylene glycol, prochlorperazine    Subjective:   Natasha Long was seen and examined  today.  Headache. She denies any chest pain or sob.  No nausea or vomiting.   Objective:   Vitals:   05/04/22 0452 05/04/22 0600 05/04/22 0735 05/04/22 1258  BP: (!) 182/109 (!) 163/96 (!) 178/105 (!) 177/111  Pulse: 98   99  Resp:   20 18  Temp:   98.2 F (36.8 C) 97.9 F (36.6 C)  TempSrc:    Axillary  SpO2: 98%       Intake/Output Summary (Last 24 hours) at 05/04/2022 1303 Last data filed at 05/04/2022 0350 Gross per 24 hour  Intake --  Output 2500 ml  Net -2500 ml   There were no vitals filed for this visit.   Exam General exam: ill appearing young female, not in distress.  Respiratory system: diminished air entry at bases, on RA.  Cardiovascular system: S1 & S2 heard, RRR. No JVD,  Gastrointestinal system: Abdomen is nondistended, soft and nontender.  Normal bowel sounds heard. Central nervous system: Alert and oriented to place and person. No deficits noted.  Extremities: Symmetric 5 x 5 power. Skin: lower lip lesions.  Psychiatry: anxious.     Data Reviewed:  I have personally reviewed following labs and imaging studies   CBC Lab Results  Component Value Date   WBC 6.3 05/03/2022   RBC 3.31 (L) 05/03/2022   HGB 10.9 (L) 05/03/2022   HCT 32.0 (L) 05/03/2022   MCV 95.2 05/03/2022   MCH 30.2 05/03/2022   PLT 144 (L) 05/03/2022   MCHC 31.7 05/03/2022   RDW 14.9 05/03/2022   LYMPHSABS 1.4 05/03/2022   MONOABS 0.6 05/03/2022   EOSABS 0.2 05/03/2022   BASOSABS 0.0 57/84/6962     Last metabolic panel Lab Results  Component Value Date   NA 138 05/03/2022   K 4.8 05/03/2022   CL 101 05/03/2022   CO2 14 (L) 05/03/2022   BUN 112 (H) 05/03/2022   CREATININE 29.67 (H) 05/03/2022   GLUCOSE 91 05/03/2022   GFRNONAA 1 (L) 05/03/2022   GFRAA 27 (L) 01/30/2020   CALCIUM 7.7 (L) 05/03/2022   PHOS >30.0 (H) 05/03/2022   PROT 8.0 03/07/2022   ALBUMIN 3.0 (L) 05/03/2022   LABGLOB 2.7 01/30/2020   AGRATIO 0.9 (L) 01/30/2020   BILITOT 0.6 03/07/2022   ALKPHOS  57 03/07/2022   AST 12 (L) 03/07/2022   ALT <5 03/07/2022   ANIONGAP 23 (H) 05/03/2022    CBG (last 3)  Recent Labs    05/02/22 2352  GLUCAP 99      Coagulation  Profile: No results for input(s): INR, PROTIME in the last 168 hours.   Radiology Studies: DG Ankle Complete Left  Result Date: 05/04/2022 CLINICAL DATA:  [id number]; pain EXAM: LEFT ANKLE COMPLETE - 3+ VIEW COMPARISON:  Radiograph dated December 20, 2018 FINDINGS: No acute fracture or dislocation. Joint spaces and alignment are maintained. No area of erosion or osseous destruction. No unexpected radiopaque foreign body. Soft tissues are unremarkable. IMPRESSION: No acute fracture or dislocation. Electronically Signed   By: Valentino Saxon M.D.   On: 05/04/2022 12:55   CT Head Wo Contrast  Result Date: 05/03/2022 CLINICAL DATA:  Mental status change, unknown cause Patient reports headache. EXAM: CT HEAD WITHOUT CONTRAST TECHNIQUE: Contiguous axial images were obtained from the base of the skull through the vertex without intravenous contrast. RADIATION DOSE REDUCTION: This exam was performed according to the departmental dose-optimization program which includes automated exposure control, adjustment of the mA and/or kV according to patient size and/or use of iterative reconstruction technique. COMPARISON:  10/18/2021 FINDINGS: Brain: No intracranial hemorrhage, mass effect, or midline shift. No hydrocephalus. The basilar cisterns are patent. No evidence of territorial infarct or acute ischemia. No extra-axial or intracranial fluid collection. Vascular: No hyperdense vessel or unexpected calcification. Skull: No fracture or focal lesion. Sinuses/Orbits: Mild mucosal thickening of ethmoid air cells. No sinus fluid levels. Mastoid air cells are clear Other: None. IMPRESSION: 1. No acute intracranial abnormality. 2. Mild mucosal thickening of ethmoid air cells. Electronically Signed   By: Keith Rake M.D.   On: 05/03/2022 00:56   DG  Chest Port 1 View  Result Date: 05/03/2022 CLINICAL DATA:  Altered mental status EXAM: PORTABLE CHEST 1 VIEW COMPARISON:  12/23/2021, 06/20/2021 FINDINGS: Interval moderate to marked cardiomegaly with vascular congestion and mild edema. Globular cardiac configuration. No pleural effusion or pneumothorax IMPRESSION: 1. Interval cardiomegaly with vascular congestion and mild edema. Globular cardiac configuration could be secondary to multi chamber enlargement versus pericardial effusion. Consider correlation with echocardiogram Electronically Signed   By: Donavan Foil M.D.   On: 05/03/2022 00:36   ECHOCARDIOGRAM LIMITED  Result Date: 05/03/2022    ECHOCARDIOGRAM LIMITED REPORT   Patient Name:   AKAYLA BRASS Date of Exam: 05/03/2022 Medical Rec #:  [id number]         Height:       68.0 in Accession #:    [id number]        Weight:       205.0 lb Date of Birth:  [birth year]         BSA:          2.065 m Patient Age:    25 years          BP:           162/99 mmHg Patient Gender: F                 HR:           100 bpm. Exam Location:  Inpatient Procedure: Limited Echo, Color Doppler and Cardiac Doppler Indications:    I31.3 Pericardial effusion  History:        Patient has prior history of Echocardiogram examinations, most                 recent 12/24/2021. Risk Factors:Hypertension and Polysubstance                 abuse. ESRD.  Sonographer:    Raquel Sarna Senior RDCS Referring Phys: [id number] [place]  1.  Left ventricular ejection fraction, by estimation, is 45 to 50%. The left ventricle has mildly decreased function.  2. Right ventricular systolic function is normal. The right ventricular size is normal.  3. The inferior vena cava is dilated in size with <50% respiratory variability, suggesting right atrial pressure of 15 mmHg.  4. Moderate pericardial effusion, measures 1.1 cm adjacent to LV lateral wall. IVC fixed/dilated, but no RA/RV collapse seen to suggest tamponade FINDINGS  Left Ventricle: Left  ventricular ejection fraction, by estimation, is 45 to 50%. The left ventricle has mildly decreased function. Right Ventricle: The right ventricular size is normal. Right ventricular systolic function is normal. Pericardium: A moderately sized pericardial effusion is present. Venous: The inferior vena cava is dilated in size with less than 50% respiratory variability, suggesting right atrial pressure of 15 mmHg. Oswaldo Milian MD Electronically signed by Oswaldo Milian MD Signature Date/Time: 05/03/2022/1:39:07 PM    Final        Hosie Poisson M.D. [facility] 05/04/2022, 1:03 PM  Available via Epic secure chat 7am-7pm After 7 pm, please refer to night coverage provider listed on amion.  ;

## 2022-05-04 NOTE — Plan of Care (Signed)

## 2022-05-04 NOTE — Progress Notes (Signed)
Pt currently menstruating, provided her with brief and pads

## 2022-05-04 NOTE — Progress Notes (Signed)
Patient refused her morning labs.

## 2022-05-04 NOTE — TOC Initial Note (Signed)
Transition of Care [facility]) - Initial/Assessment Note    Patient Details  Name: Aprille Sawhney MRN: [id number] Date of Birth: 30-Jan-1996  Transition of Care [facility]) CM/SW Contact:    Angelita Ingles, RN Phone [phone]  05/04/2022, 11:29 AM  Clinical Narrative:                 TOC following patient with high rick for readmission. Cm at bedside to complete assessment. CM at bedside introduces self to patient and explained the reason for CM being at bedside. Patient closes eyes when asked if CM can ask patient a few questions. Patient re opens eyes when name is called. CM asked patient if CM can ask a few questions. Patient then closes eyes again. Patient nods no when asked if CM can ask a few questions. CM unable to complete high rick assessment at this time. TOC will continue to follow for any disposition needs.        Patient Goals and CMS Choice        Expected Discharge Plan and Services                                                Prior Living Arrangements/Services                       Activities of Daily Living      Permission Sought/Granted                  Emotional Assessment              Admission diagnosis:  Uremia [N19] Anemia associated with chronic renal failure [N18.9, D63.1] Altered mental status, unspecified altered mental status type [R41.82] AMS (altered mental status) [R41.82] Patient Active Problem List   Diagnosis Date Noted   AMS (altered mental status) 05/03/2022   Syphilis    Pain management    Septic arthritis of multiple joints ([place]) 02/01/2022   Group G streptococcal infection 02/01/2022   Left wrist pain    Hyperkalemia 01/30/2022   Effusion of right knee    Arthritis, septic [facility])    Dialysis patient, noncompliant    Knee pain, acute 01/29/2022   Hypokalemia 01/29/2022   Hyperphosphatemia 01/29/2022   Chest pain    Hypervolemia associated with renal insufficiency 12/23/2021   Elevated  troponin 12/23/2021   ESRD on dialysis ([place]) 12/04/2021   Tobacco use 08/11/2021   Nipple discharge 08/11/2021   Breast pain 08/11/2021   Exposure to sexually transmitted disease (STD) 64/33/2951   Complication of vascular dialysis catheter 02/12/2021   Moderate protein-calorie malnutrition ([place]) 01/27/2021   Allergy, unspecified, initial encounter 01/23/2021   Anemia in chronic kidney disease 01/23/2021   End stage renal disease ([place]) 01/23/2021   Iron deficiency anemia, unspecified 01/23/2021   Other specified coagulation defects ([place]) 01/23/2021   Secondary hyperparathyroidism of renal origin ([place]) 01/23/2021   Anemia    Suicidal ideations    Influenza vaccine refused 12/19/2020   Morbid obesity ([place]) 01/30/2020   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   Cocaine use, 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[place]) 01/30/2020   Affective psychosis, bipolar ([place]) 06/27/2019   Borderline personality disorder ([place]) 11/16/2018   Moderate cannabis use disorder ([place]) 11/16/2018   Severe recurrent major depression without psychotic features ([place]) 11/15/2018   Chronic hypertension 04/04/2018   PTSD (post-traumatic  stress disorder) 04/01/2018   PCP:  [name], MD Pharmacy:   [facility] [place], [place] - [address] [place] [address] Phone: [phone] Fax: [place] [address] [place] [address] Phone: [phone] Fax: [phone]     Social Determinants of Health (SDOH) Interventions    Readmission Risk Interventions     View : No data to display.

## 2022-05-04 NOTE — TOC CAGE-AID Note (Signed)
Transition of Care [facility]) - CAGE-AID Screening   Patient Details  Name: Tranae Laramie MRN: [id number] Date of Birth: 1996-01-17  Transition of Care [facility]) CM/SW Contact:    Angelita Ingles, RN Phone Number:[phone]  05/04/2022, 11:25 AM   Clinical Narrative: CM attempted to complete CAGE aid assessment. Patient opens eyes when name called. CM introduces self and explained purpose of CM being at bedside. Patient closed eyes. CM asked patient is she is willing to participate in substance abuse consult  patient closes eyes and nods no.    CAGE-AID Screening: Substance Abuse Screening unable to be completed due to: : Patient Refused             Substance Abuse Education Offered: No (Patient refused)  Substance abuse interventions: Other (must comment) (unable patient refusing)

## 2022-05-04 NOTE — Progress Notes (Signed)
Patient ID: Natasha Long, female   DOB: 04-11-1996, 26 y.o.   MRN: [id number]  [facility] Progress Note   Assessment/ Plan:   1.  Altered mental status: This appears to be consistent with uremic encephalopathy from her severe azotemia although cannot rule out component of depression given her past medical history.  The plan is to undergo hemodialysis again today. 2. ESRD: On a Monday/Wednesday/Friday dialysis schedule and has been nonadherent with hemodialysis in the past with similar hospitalizations in the past.  She is on schedule for hemodialysis again today-we are doing this in a "low and slow" manner to avoid dialysis disequilibrium. 3. Anemia: Hemoglobin and hematocrit currently at goal, no overt losses.  No current need for ESA. 4. CKD-MBD: Severe hyperphosphatemia noted due to nonadherence with hemodialysis/diet/binders.  Will discuss when mental status allows. 5. Nutrition: Continue renal diet with fluid restriction. 6. Hypertension: Blood pressures elevated, resume antihypertensive agents/hemodialysis.  Subjective:   Underwent hemodialysis yesterday, no acute events overnight   Objective:   BP (!) 178/105 (BP Location: Right Arm)   Pulse 98   Temp 98.2 F (36.8 C)   Resp 20   SpO2 98%   Physical Exam: Gen: Somnolent this morning, does not respond to calling her name CVS: Pulse regular rhythm, normal rate, S1 and S2 normal Resp: Clear to auscultation bilaterally, no distinct rales/rhonchi Abd: Soft, obese, nontender, bowel sounds normal Ext: Trace lower extremity edema, left upper arm AV fistula with intact dressings  Labs: BMET Recent Labs  Lab 05/02/22 2159 05/03/22 0454 05/03/22 0503  NA 136 138 138  K 4.9 4.8 4.8  CL 99 101  --   CO2 15* 14*  --   GLUCOSE 94 91  --   BUN 112* 112*  --   CREATININE 29.68* 29.67*  --   CALCIUM 7.8* 7.7*  --   PHOS  --  >30.0*  --    CBC Recent Labs  Lab 05/02/22 2159 05/03/22 0454 05/03/22 0503  WBC  7.4 6.3  --   NEUTROABS  --  4.1  --   HGB 10.7* 10.0* 10.9*  HCT 34.0* 31.5* 32.0*  MCV 96.3 95.2  --   PLT 171 144*  --      Medications:     amLODipine  10 mg Oral Daily   ARIPiprazole  7.5 mg Oral Daily   calcium acetate  667 mg Oral TID WC   Chlorhexidine Gluconate Cloth  6 each Topical Q0600   losartan  50 mg Oral Daily   pantoprazole  40 mg Oral Daily   torsemide  40 mg Oral Daily   Elmarie Shiley, MD 05/04/2022, 9:21 AM

## 2022-05-04 NOTE — Progress Notes (Signed)
Patient refused ekg. Rn was notified.

## 2022-05-04 NOTE — Progress Notes (Signed)
Received patient in bed, alert and oriented. Informed consent signed and in chart.  Time tx initiated:0035  Pre HD weight:96.9 kg  Pre HD VS: 98.4, 184/110, 96, 22, 96.9 kg  Time tx completed:0350  HD treatment completed. Patient tolerated well. Fistula  without signs and symptoms of complications. Patient transported back to the room, alert,  oriented, and in no acute distress. Report given to [place], [place],RN.  Total UF removed:2500  Medication given:none  Post HD VS: 99, 181/104, 94, 18, and 94.4 kg  Post HD weight: 94.4 kg

## 2022-05-05 DIAGNOSIS — R4182 Altered mental status, unspecified: Secondary | ICD-10-CM | POA: Diagnosis not present

## 2022-05-05 DIAGNOSIS — N19 Unspecified kidney failure: Secondary | ICD-10-CM

## 2022-05-05 DIAGNOSIS — I3139 Other pericardial effusion (noninflammatory): Secondary | ICD-10-CM

## 2022-05-05 DIAGNOSIS — F1494 Cocaine use, unspecified with cocaine-induced mood disorder: Secondary | ICD-10-CM

## 2022-05-05 LAB — CBC WITH DIFFERENTIAL/PLATELET
Abs Immature Granulocytes: 0.01 10*3/uL (ref 0.00–0.07)
Basophils Absolute: 0 10*3/uL (ref 0.0–0.1)
Basophils Relative: 1 %
Eosinophils Absolute: 0.2 10*3/uL (ref 0.0–0.5)
Eosinophils Relative: 3 %
HCT: 38.7 % (ref 36.0–46.0)
Hemoglobin: 12.2 g/dL (ref 12.0–15.0)
Immature Granulocytes: 0 %
Lymphocytes Relative: 19 %
Lymphs Abs: 0.9 10*3/uL (ref 0.7–4.0)
MCH: 29 pg (ref 26.0–34.0)
MCHC: 31.5 g/dL (ref 30.0–36.0)
MCV: 92.1 fL (ref 80.0–100.0)
Monocytes Absolute: 0.5 10*3/uL (ref 0.1–1.0)
Monocytes Relative: 11 %
Neutro Abs: 3 10*3/uL (ref 1.7–7.7)
Neutrophils Relative %: 66 %
Platelets: 174 10*3/uL (ref 150–400)
RBC: 4.2 MIL/uL (ref 3.87–5.11)
RDW: 14.3 % (ref 11.5–15.5)
WBC: 4.5 10*3/uL (ref 4.0–10.5)
nRBC: 0 % (ref 0.0–0.2)

## 2022-05-05 LAB — BASIC METABOLIC PANEL
Anion gap: 14 (ref 5–15)
BUN: 48 mg/dL — ABNORMAL HIGH (ref 6–20)
CO2: 23 mmol/L (ref 22–32)
Calcium: 8.8 mg/dL — ABNORMAL LOW (ref 8.9–10.3)
Chloride: 100 mmol/L (ref 98–111)
Creatinine, Ser: 16.36 mg/dL — ABNORMAL HIGH (ref 0.44–1.00)
GFR, Estimated: 3 mL/min — ABNORMAL LOW (ref >60)
Glucose, Bld: 82 mg/dL (ref 70–99)
Potassium: 4.3 mmol/L (ref 3.5–5.1)
Sodium: 137 mmol/L (ref 135–145)

## 2022-05-05 LAB — AEROBIC CULTURE W GRAM STAIN (SUPERFICIAL SPECIMEN): Gram Stain: NONE SEEN

## 2022-05-05 LAB — HEPATITIS B SURFACE ANTIBODY, QUANTITATIVE: Hep B S AB Quant (Post): >1000 m[IU]/mL (ref >9.9)

## 2022-05-05 MED ORDER — DOXYCYCLINE HYCLATE 100 MG PO TABS
100.0000 mg | ORAL_TABLET | Freq: Two times a day (BID) | ORAL | Status: DC
  Administered 2022-05-05 – 2022-05-06 (×2): 100 mg via ORAL
  Filled 2022-05-05 (×2): qty 1
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
## 2022-05-05 NOTE — Progress Notes (Signed)
Pt refusing am meds and says she will take them "later," including BP meds. She also refuses am enzymes. RN will try to give them again a little later.

## 2022-05-05 NOTE — Progress Notes (Signed)
Patient ID: Natasha Long, female   DOB: Jun 12, 1996, 26 y.o.   MRN: [id number] Patient seen up in dialysis unit where she is refusing hemodialysis. She claims she feels worse after dialysis and dose not want to be cannulated for a 3rd day in a row for dialysis. Explained the benefits of doing dialysis but she is unwilling to allow cannulation.  Will have patient return back to her room. Will re-attempt dialysis again tomorrow if still here.   Elmarie Shiley MD [facility]. Office # [phone] Pager # [phone] 10:56 AM

## 2022-05-05 NOTE — Progress Notes (Signed)
[facility]                                                                               Natasha Long, is a 26 y.o. female, DOB - Mar 31, 1996, MGQ:[id number] Admit date - 05/02/2022    Outpatient Primary MD for the patient is [name], MD  LOS - 2  days    Brief summary   Natasha Long is a 26 y.o. female with medical history significant for ESRD on HD, hypertension, polysubstance abuse including cocaine and THC, History of septic arthritis of multiple joints, history of syphillis/gonorrhea, major depressive disorder/generalized anxiety disorder, medical noncompliance, missed hemodialysis for the past 2 weeks, states she does not like going to hemodialysis, who presented to [facility] ED from home via EMS with several complaints which included headache, left wrist edema and pain, left ankle edema and pain, nausea, chills.   She was admitted for acute uremic and acute metabolic encephalopathy from missing HD.    Assessment & Plan    Assessment and Plan:  Acute metabolic and Uremic encephalopathy: secondary to missing HD and Cocaine and Tetrahydrocannabinol use.  -  She is alert and oriented to place and person. But she refuses to any other questions.  - further management as per Nephrology.  - HD daily.    High AG metabolic acidosis:  Resolved.      Lower lip lesions: cultures growing MRSA, unclear if colonization vs infection.  ? Chemical burn from smoking cocaine? Pain control.  Added doxycycline to Unasyn.     Acute Systolic heart failure:  Volume management as per HD.  Echocardiogram showed moderate pericardial effusion without signs of tamponade.  Left ventricular ejection fraction, s 45 to 50%, has mildly decreased function. Right ventricular systolic function is normal.  Moderate pericardial effusion, measures 1.1 cm adjacent to LV lateral wall. IVC fixed/dilated, but no RA/RV collapse seen to suggest tamponade . Cardiology consulted for  moderate pleural effusion.    Hypertensive Urgency:  From missing HD, and non compliance to meds.  Restarted home meds .   Body mass index is 31.44 kg/m. Obesity:     Polysubstance abuse with ongoing THC and Cocaine abuse:  Toc consulted for resources.    Left ankle cellulitis: tenderness and swelling has improved.  X rays do not show any fractures or joint effusions.  Transition to oral antibiotics in the next 24 hours.    Pt refusing labs, care, medications, non compliant to meds and HD sessions, polysubstance abuse.  Request psychiatry consult to evaluate for depression and drug dependence.      Estimated body mass index is 31.44 kg/m as calculated from the following:   Height as of 03/07/22: 5\' 8"  (1.727 m).   Weight as of this encounter: 93.8 kg.  Code Status: full code.  DVT Prophylaxis:  SCDs Start: 05/03/22 0417   Level of Care: Level of care: Progressive Family Communication: none at bedside.   Disposition Plan:     Remains inpatient appropriate:  IV antibiotics.   Procedures:  HD  Consultants:   Nephrology.  Psychiatry.   Antimicrobials:   Anti-infectives (From admission, onward)    Start  Dose/Rate Route Frequency Ordered Stop   05/03/22 0630  ampicillin (OMNIPEN) 2 g in sodium chloride 0.9 % 100 mL IVPB       Note to Pharmacy: Ampicillin 2 g IV q24h in ESRD on HD   2 g 300 mL/hr over 20 Minutes Intravenous Daily 05/03/22 0531          Medications  Scheduled Meds:  amLODipine  10 mg Oral Daily   ARIPiprazole  7.5 mg Oral Daily   calcium acetate  667 mg Oral TID WC   Chlorhexidine Gluconate Cloth  6 each Topical Q0600   losartan  50 mg Oral Daily   pantoprazole  40 mg Oral Daily   torsemide  40 mg Oral Daily   Continuous Infusions:  ampicillin (OMNIPEN) IV 2 g (05/04/22 1228)   PRN Meds:.acetaminophen, hydrALAZINE, hydrOXYzine, lip balm, polyethylene glycol, prochlorperazine, traMADol    Subjective:   Natasha Long was  seen today. She is refusing to answer questions and refused to be examined, on two separate occasions.   Objective:   Vitals:   05/04/22 2128 05/04/22 2316 05/05/22 0323 05/05/22 0835  BP: (!) 173/99 (!) 182/108 (!) 155/90 (!) 175/110  Pulse: (!) 108 (!) 110 99 100  Resp: (!) 23 18 19 19   Temp: 98.6 F (37 C) 98.3 F (36.8 C) 98 F (36.7 C) 98.8 F (37.1 C)  TempSrc: Oral Oral Oral Axillary  SpO2: 94% 100% 93% 92%  Weight:        Intake/Output Summary (Last 24 hours) at 05/05/2022 1044 Last data filed at 05/04/2022 1500 Gross per 24 hour  Intake 195 ml  Output --  Net 195 ml    Filed Weights   05/04/22 1600 05/04/22 1915  Weight: 95.4 kg 93.8 kg     Exam Pt refused exam.    Data Reviewed:  I have personally reviewed following labs and imaging studies   CBC Lab Results  Component Value Date   WBC 6.3 05/03/2022   RBC 3.31 (L) 05/03/2022   HGB 10.9 (L) 05/03/2022   HCT 32.0 (L) 05/03/2022   MCV 95.2 05/03/2022   MCH 30.2 05/03/2022   PLT 144 (L) 05/03/2022   MCHC 31.7 05/03/2022   RDW 14.9 05/03/2022   LYMPHSABS 1.4 05/03/2022   MONOABS 0.6 05/03/2022   EOSABS 0.2 05/03/2022   BASOSABS 0.0 65/46/5035     Last metabolic panel Lab Results  Component Value Date   NA 138 05/03/2022   K 4.8 05/03/2022   CL 101 05/03/2022   CO2 14 (L) 05/03/2022   BUN 112 (H) 05/03/2022   CREATININE 29.67 (H) 05/03/2022   GLUCOSE 91 05/03/2022   GFRNONAA 1 (L) 05/03/2022   GFRAA 27 (L) 01/30/2020   CALCIUM 7.7 (L) 05/03/2022   PHOS >30.0 (H) 05/03/2022   PROT 8.0 03/07/2022   ALBUMIN 3.0 (L) 05/03/2022   LABGLOB 2.7 01/30/2020   AGRATIO 0.9 (L) 01/30/2020   BILITOT 0.6 03/07/2022   ALKPHOS 57 03/07/2022   AST 12 (L) 03/07/2022   ALT <5 03/07/2022   ANIONGAP 23 (H) 05/03/2022    CBG (last 3)  Recent Labs    05/02/22 2352  GLUCAP 99       Coagulation Profile: No results for input(s): INR, PROTIME in the last 168 hours.   Radiology Studies: DG Ankle  Complete Left  Result Date: 05/04/2022 CLINICAL DATA:  [id number]; pain EXAM: LEFT ANKLE COMPLETE - 3+ VIEW COMPARISON:  Radiograph dated December 20, 2018 FINDINGS: No acute fracture or  dislocation. Joint spaces and alignment are maintained. No area of erosion or osseous destruction. No unexpected radiopaque foreign body. Soft tissues are unremarkable. IMPRESSION: No acute fracture or dislocation. Electronically Signed   By: Valentino Saxon M.D.   On: 05/04/2022 12:55       Hosie Poisson M.D. [facility] 05/05/2022, 10:44 AM  Available via Epic secure chat 7am-7pm After 7 pm, please refer to night coverage provider listed on amion.  ;

## 2022-05-05 NOTE — Progress Notes (Signed)
Patient ID: Natasha Long, female   DOB: 13-Aug-1996, 26 y.o.   MRN: [id number] [facility] Progress Note   Assessment/ Plan:   1.  Altered mental status: Likely from uremic encephalopathy from severe azotemia/missing hemodialysis prior to hospitalization.  She does have an underlying history of depression/borderline personality disorder and it is unclear how much this is playing a role in her current presentation symptoms. 2. ESRD: On a Monday/Wednesday/Friday dialysis schedule and has been nonadherent with hemodialysis with similar hospitalizations in the past.  She underwent hemodialysis on Sunday and Monday and I will order for hemodialysis again today to try and improve on her azotemia prior to possible discharge. 3. Anemia: Hemoglobin and hematocrit currently at goal, no overt losses.  No current need for ESA. 4. CKD-MBD: Severe hyperphosphatemia noted due to nonadherence with hemodialysis/diet/binders.  Will discuss when mental status allows. 5. Nutrition: Continue renal diet with fluid restriction. 6. Hypertension: Blood pressures elevated, resume antihypertensive agents/hemodialysis.  Subjective:   Nurse at bedside informs me that she has been sleeping soundly for most of yesterday but awoke in the evening and took a shower without any problems.   Objective:   BP (!) 155/90 (BP Location: Right Wrist)   Pulse 99   Temp 98 F (36.7 C) (Oral)   Resp 19   Wt 93.8 kg   SpO2 93%   BMI 31.44 kg/m   Physical Exam: Gen: Sleeping soundly and difficult to arouse by calling her name CVS: Pulse regular rhythm, normal rate, S1 and S2 normal Resp: Clear to auscultation bilaterally, no distinct rales/rhonchi Abd: Soft, obese, nontender, bowel sounds normal Ext: Trace lower extremity edema, left upper arm AV fistula with intact dressings  Labs: BMET Recent Labs  Lab 05/02/22 2159 05/03/22 0454 05/03/22 0503  NA 136 138 138  K 4.9 4.8 4.8  CL 99 101  --   CO2 15* 14*   --   GLUCOSE 94 91  --   BUN 112* 112*  --   CREATININE 29.68* 29.67*  --   CALCIUM 7.8* 7.7*  --   PHOS  --  >30.0*  --    CBC Recent Labs  Lab 05/02/22 2159 05/03/22 0454 05/03/22 0503  WBC 7.4 6.3  --   NEUTROABS  --  4.1  --   HGB 10.7* 10.0* 10.9*  HCT 34.0* 31.5* 32.0*  MCV 96.3 95.2  --   PLT 171 144*  --      Medications:     amLODipine  10 mg Oral Daily   ARIPiprazole  7.5 mg Oral Daily   calcium acetate  667 mg Oral TID WC   Chlorhexidine Gluconate Cloth  6 each Topical Q0600   losartan  50 mg Oral Daily   pantoprazole  40 mg Oral Daily   torsemide  40 mg Oral Daily   Elmarie Shiley, MD 05/05/2022, 7:32 AM

## 2022-05-05 NOTE — Progress Notes (Addendum)
Pt refused NT for v/s and refusing to be hooked back up to tele monitor. Pt told NT "I don't feel like being touched today."

## 2022-05-05 NOTE — Progress Notes (Signed)
Pt arrived to unit at 1038 refused treatment Dr Posey Pronto called to bedside pt continued to refuse and refused to sign AMA placed on chart RN notified

## 2022-05-06 ENCOUNTER — Ambulatory Visit: Payer: Medicaid Other | Admitting: Physician Assistant

## 2022-05-06 DIAGNOSIS — M25572 Pain in left ankle and joints of left foot: Secondary | ICD-10-CM | POA: Diagnosis not present

## 2022-05-06 DIAGNOSIS — K130 Diseases of lips: Secondary | ICD-10-CM

## 2022-05-06 DIAGNOSIS — I10 Essential (primary) hypertension: Secondary | ICD-10-CM

## 2022-05-06 DIAGNOSIS — R4182 Altered mental status, unspecified: Secondary | ICD-10-CM | POA: Diagnosis not present

## 2022-05-06 DIAGNOSIS — R799 Abnormal finding of blood chemistry, unspecified: Secondary | ICD-10-CM

## 2022-05-06 DIAGNOSIS — N898 Other specified noninflammatory disorders of vagina: Secondary | ICD-10-CM

## 2022-05-06 DIAGNOSIS — F603 Borderline personality disorder: Secondary | ICD-10-CM

## 2022-05-06 LAB — CULTURE, BLOOD (ROUTINE X 2): Special Requests: ADEQUATE

## 2022-05-06 LAB — HIV ANTIBODY (ROUTINE TESTING W REFLEX): HIV Screen 4th Generation wRfx: NONREACTIVE

## 2022-05-06 MED ORDER — HEPARIN SODIUM (PORCINE) 1000 UNIT/ML DIALYSIS: 2500.0000 [IU] | INTRAMUSCULAR | Status: DC | PRN

## 2022-05-06 MED ORDER — ALTEPLASE 2 MG IJ SOLR: 2.0000 mg | Freq: Once | INTRAMUSCULAR | Status: DC | PRN

## 2022-05-06 MED ORDER — LIDOCAINE HCL (PF) 1 % IJ SOLN: 5.0000 mL | INTRAMUSCULAR | Status: DC | PRN

## 2022-05-06 MED ORDER — ACYCLOVIR 5 % EX OINT
TOPICAL_OINTMENT | Freq: Four times a day (QID) | CUTANEOUS | Status: DC
  Filled 2022-05-06: qty 15

## 2022-05-06 MED ORDER — ANTICOAGULANT SODIUM CITRATE 4% (200MG/5ML) IV SOLN: 5.0000 mL | Status: DC | PRN

## 2022-05-06 MED ORDER — HEPARIN SODIUM (PORCINE) 1000 UNIT/ML DIALYSIS: 1000.0000 [IU] | INTRAMUSCULAR | Status: DC | PRN

## 2022-05-06 MED ORDER — PENTAFLUOROPROP-TETRAFLUOROETH EX AERO: 1.0000 "application " | INHALATION_SPRAY | CUTANEOUS | Status: DC | PRN

## 2022-05-06 MED ORDER — LIDOCAINE-PRILOCAINE 2.5-2.5 % EX CREA: 1.0000 "application " | TOPICAL_CREAM | CUTANEOUS | Status: DC | PRN

## 2022-05-06 MED ORDER — HEPARIN SODIUM (PORCINE) 1000 UNIT/ML DIALYSIS: 40.0000 [IU]/kg | INTRAMUSCULAR | Status: DC | PRN

## 2022-05-06 NOTE — Progress Notes (Signed)
This nurse received call from floor RN after receiving report. Stating patient refused Dialysis treatment this morning.

## 2022-05-06 NOTE — Progress Notes (Signed)
Notified of BP 86/53 this morning.   She had been hypertensive up to this point and received 2 doses IV hydralazine on 6/6.   She has ESRD with hypervolemia, and we planned to recheck the BP in 15 minutes prior to any intervention but she is now refusing to have her vitals taken again.   She does not have any complaints, appears to be asymptomatic, accuracy of the low BP reading is in question, and given her ESRD with hypervolemia safest approach at this point is to simply recheck BP when patient allows.

## 2022-05-06 NOTE — Assessment & Plan Note (Signed)
-   was suspected due to uremic encephalopathy; has improved with HD since admission

## 2022-05-06 NOTE — Progress Notes (Signed)
Progress Note    Natasha Long   XKG:[id number]  DOB: 1995-12-06  DOA: 05/02/2022     3 PCP: [name], MD  Initial CC: missed HD  Hospital Course: Ms. Lindenbaum is a 26 yo female with PMH borderline PD, previous suicide attempts, ESRD on HD, polysubstance abuse, HTN, septic arthritis, hx syphillis/gonorrhea, med and HD noncompliance who presented with headache, left wrist swelling and pain, left ankle swelling and pain, nausea, chills.  She had missed outpatient sessions of dialysis prior to admission.  Interval History:  This morning she is complaining of vaginal itching and ongoing pain in her left ankle and left wrist.  She agreed for dialysis this morning. Lower lip also continues to be painful, swollen, and partially ulcerated.   Assessment and Plan: * AMS (altered mental status)-resolved as of 05/06/2022 - was suspected due to uremic encephalopathy; has improved with HD since admission   ESRD on dialysis [facility]) - Intermittently refusing dialysis but agreed for dialysis today -Nephrology following, appreciate assistance  Left ankle pain - mild swelling and pain in left ankle and left wrist - follow up GC/CHL testing - low suspicion for cellulitis at this time; d/c ampicillin   Vaginal itching - denies discharge but notes recently new itching - check GC/CHL given history; last sexual partner reported per patient was 2-3 days prior to admission; unprotected consensual sex  - HIV negative on admission   Lip lesion - differential includes heat/chemical burn from cocaine vs ulcerative lesion/aphthous ulcer  - continue lip balm - add on acyclovir ointment to lip to see if improvement  - lip swab/culture growing MRSA? Unclear significance given skin swab; d/c doxy and monitor   Pericardial effusion without cardiac tamponade - Echocardiogram showed moderate pericardial effusion without signs of tamponade.  - Left ventricular ejection fraction, 45 to 50%, has mildly  decreased function. Right ventricular systolic function is normal.  Moderate pericardial effusion, measures 1.1 cm adjacent to LV lateral wall. IVC fixed/dilated, but no RA/RV collapse seen to suggest tamponade per read - appreciate cardiology consult; no further workup needed; ongoing dialysis recommended   Contamination of blood culture - 1/4 bottles from 6/4 noted with Staph epi, considered contaminate - no further workup   Cocaine use, 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[place]) - Patient endorses crack cocaine and marijuana use prior to admission.  Counseling has been given  Borderline personality disorder [facility]) - Psychiatry following as she has been intermittently refusing care - No change in management at this time  Chronic hypertension - Continue amlodipine and losartan    Old records reviewed in assessment of this patient  Antimicrobials: Ampicillin 05/03/2022 >> 05/06/2022 Doxycycline 05/05/2022 >> 05/06/2022  DVT prophylaxis:  SCDs Start: 05/03/22 0417   Code Status:   Code Status: Full Code  Disposition Plan: Home in 1 to 2 days Status is: Inpatient  Objective: Blood pressure (!) 174/110, pulse 98, temperature 98.4 F (36.9 C), temperature source Oral, resp. rate 20, weight 93.2 kg, SpO2 94 %.  Examination:  Physical Exam Constitutional:      General: She is not in acute distress.    Appearance: Normal appearance.  HENT:     Head:     Comments: Lower lip noted with almost ulcerated lesions and scabbing from recent bleeding    Mouth/Throat:     Mouth: Mucous membranes are moist.  Eyes:     Extraocular Movements: Extraocular movements intact.  Cardiovascular:     Rate and Rhythm: Normal rate and regular rhythm.  Pulmonary:     Effort: Pulmonary effort is normal.     Breath sounds: Normal breath sounds.  Abdominal:     General: Bowel sounds are normal. There is no distension.     Palpations: Abdomen is soft.     Tenderness: There is no abdominal  tenderness.  Musculoskeletal:     Cervical back: Normal range of motion and neck supple.     Comments: Approximately 1+ left ankle edema with painful ROM.  Left wrist also noted with no edema but has painful ROM.  No perceived erythema, calor with either joint  Neurological:     Mental Status: She is alert.     Consultants:    Procedures:    Data Reviewed: No results found for this or any previous visit (from the past 24 hour(s)).  I have Reviewed nursing notes, Vitals, and Lab results since pt's last encounter. Pertinent lab results : see above I have ordered test including BMP, CBC, Mg I have reviewed the last note from staff over past 24 hours I have discussed pt's care plan and test results with nursing staff, case manager   LOS: 3 days   Dwyane Dee, MD [facility] 05/06/2022, 3:00 PM

## 2022-05-06 NOTE — Assessment & Plan Note (Signed)
-   Patient endorses crack cocaine and marijuana use prior to admission.  Counseling has been given

## 2022-05-06 NOTE — Assessment & Plan Note (Signed)
-   Echocardiogram showed moderate pericardial effusion without signs of tamponade.  - Left ventricular ejection fraction, 45 to 50%, has mildly decreased function. Right ventricular systolic function is normal.  Moderate pericardial effusion, measures 1.1 cm adjacent to LV lateral wall. IVC fixed/dilated, but no RA/RV collapse seen to suggest tamponade per read - appreciate cardiology consult; no further workup needed; ongoing dialysis recommended

## 2022-05-06 NOTE — Progress Notes (Addendum)
This rn called and notified Dr Posey Pronto pt wanting to ambulate to bathroom while a/v fistula accessed and hemodialysis tx in progress. Md informed pt refused bedpan and asking to end treatment now, md informed pt current uf removal 700 ml, 1hr 20 mins remaining, Will have pt sign ama form per md. This rn reinforced importance of completing hd tx, pt acknowledges understanding of ending tx early. Pt calmly refused to sign ama form to end current hemodialysis session, pt refusal witnessed by two HD nurses

## 2022-05-06 NOTE — Consult Note (Signed)
[facility] New Face-to-Face Psychiatric Evaluation   Service Date: May 06, 2022 LOS:  LOS: 3 days    Assessment  Natasha Long is a 26 y.o. female admitted medically on 05/02/2022  9:41 PM for encephalopathy, thought to be secondary to azotemia. She carries the psychiatric diagnoses of Borderline Personality Disorder with previous suicide attempts and possible bipolar affective disorder and has a past medical history of ESRD on HD. Psychiatry was consulted for "pt refusing meds, suspect depression, requesting assist with meds" by Dr. Hosie Poisson.   Likely MDD, Hx Borderline personality disorder Her current presentation of depression is most consistent with a major depressive episode in the context of borderline personality disorder. Current outpatient psychotropic medications include gabapentin 300 mg TID, Abilify 7.5 mg daily, and Vistaril 25 mg q8hrs PRN, which the patient reports non-adherence with. Interview and assessment is limited by poor participation, but patient denies SI/HI/AVH.   Repeat interview on 6/7 yields continued poor engagement and frustration from the patient, though she does state she feels her Abilify is helpful.   Diagnoses:  Active Hospital problems: Principal Problem:   AMS (altered mental status) Active Problems:   Chronic hypertension   Borderline personality disorder (HCC)   Cocaine use, 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HCC)   Anemia   Anemia in chronic kidney disease   ESRD on dialysis ([place])   Uremia   Pericardial effusion without cardiac tamponade     Plan  ## Safety and Observation Level:  - Based on my clinical evaluation, I estimate the patient to be at low risk of self harm in the current setting - At this time, we recommend a routine level of observation. This decision is based on my review of the chart including patient's history and current presentation, interview of the patient, mental status examination, and  consideration of suicide risk including evaluating suicidal ideation, plan, intent, suicidal or self-harm behaviors, risk factors, and protective factors. This judgment is based on our ability to directly address suicide risk, implement suicide prevention strategies and develop a safety plan while the patient is in the clinical setting. Please contact our team if there is a concern that risk level has changed.   ## Medications:  --Continue present medications, patient declines medication changes   ## Medical Decision Making Capacity:  Not formally assessed  ## Further Work-up:  -- most recent EKG on 6/7 had a Qtc of 469 -- Pertinent labwork reviewed earlier this admission includes: BUN of 112, UDS positive for cocaine and THC  ## Disposition:  --Patient does not meet criteria for inpatient psychiatric placement at this time  ##Legal Status VOLUNTARY   Thank you for this consult request. Recommendations have been communicated to the primary team.  We will follow at this time.   Corky Sox, MD   NEW history  Relevant Aspects of Hospital Course:  Admitted on 05/02/2022 for missed dialysis and encephalopathy.  Patient Report:  Patient seen in late AM. 0% of breakfast consumed. Per chart she refused dialysis this AM.    Reports worsening depression - in general has dealt with this since about age 49. She does not like dialysis because she feels weak afterwards. She skipped several sessions but ultimately called EMS herself. She is currently living alone. No friends, romantic partners, family, etc. Feeling lonely. No recent suicidal thoughts or homicidal thoughts. No recent [place]. Has not been taking her meds outside of the hospital. Patient abruptly asks providers to leave the room and for the  interview to end.  6/7 Pt seen in late AM. Declines to have lights turned on, declines to sit up for conversation. States mood is "OK". Has not eaten breakfast. When asked if there is something  that will make her happy states "You getting out of my face" and covers her head with the blanket. EKG today qtc 469.  Social History:  Unable to obtain, patient ended interview early  Family History:  The patient's family history includes Diabetes in an other family member; Hypertension in an other family member. She was adopted.  Medical History: Past Medical History:  Diagnosis Date   Asthma    as a child, no problem as an adult, no inhaler   Complication of anesthesia    woke up before tube removed, 1 time fought nurses   ESRD on hemodialysis [facility])    M-W-F   History of borderline personality disorder    Hypertension    diagnosed as child; stopped meds at 58 yo   Insomnia    Neuromuscular disorder ([place])    peripheral neuropathy   PTSD (post-traumatic stress disorder)     Surgical History: Past Surgical History:  Procedure Laterality Date   AV FISTULA PLACEMENT Left 10/18/2020   Procedure: LEFT ARM ARTERIOVENOUS (AV) FISTULA CREATION;  Surgeon: Serafina Mitchell, MD;  Location: [facility] OR;  Service: Vascular;  Laterality: Left;   CHOLECYSTECTOMY     extraction of wisdom teeth     FISTULA SUPERFICIALIZATION Left 02/13/2021   Procedure: LEFT BRACHIOCEPHALIC ARTERIOVENOUS FISTULA SUPERFICIALIZATION;  Surgeon: Serafina Mitchell, MD;  Location: [place];  Service: Vascular;  Laterality: Left;   I & D EXTREMITY Left 01/30/2022   Procedure: IRRIGATION AND DEBRIDEMENT EXTREMITY;  Surgeon: Milly Jakob, MD;  Location: [place];  Service: Orthopedics;  Laterality: Left;   INCISION AND DRAINAGE OF WOUND Left 01/30/2022   Procedure: LEFT WRIST ASPIRATION;  Surgeon: Vanetta Mulders, MD;  Location: [place];  Service: Orthopedics;  Laterality: Left;   KNEE ARTHROSCOPY Right 01/30/2022   Procedure: ARTHROSCOPY KNEE AND IRRIIGATION AND DEBRIDMENT; LEFT WRIST ASPIRATION;  Surgeon: Vanetta Mulders, MD;  Location: [place];  Service: Orthopedics;  Laterality: Right;   RENAL BIOPSY     x 2   TUNNELED VENOUS  CATHETER PLACEMENT  02/11/2021   [facility]    Medications:   Current Facility-Administered Medications:    acetaminophen (TYLENOL) tablet 650 mg, 650 mg, Oral, Q6H PRN, Hall, Carole N, DO, 650 mg at 05/04/22 1007   amLODipine (NORVASC) tablet 10 mg, 10 mg, Oral, Daily, Karleen Hampshire, Vijaya, MD, 10 mg at 05/06/22 1051   ampicillin (OMNIPEN) 2 g in sodium chloride 0.9 % 100 mL IVPB, 2 g, Intravenous, Daily, Hall, Carole N, DO, Last Rate: 300 mL/hr at 05/06/22 1105, 2 g at 05/06/22 1105   ARIPiprazole (ABILIFY) tablet 7.5 mg, 7.5 mg, Oral, Daily, Hall, Carole N, DO, 7.5 mg at 05/06/22 1051   calcium acetate (PHOSLO) capsule 667 mg, 667 mg, Oral, TID WC, Hall, Carole N, DO, 667 mg at 05/06/22 1120   Chlorhexidine Gluconate Cloth 2 % PADS 6 each, 6 each, Topical, Q0600, Roney Jaffe, MD   doxycycline (VIBRA-TABS) tablet 100 mg, 100 mg, Oral, Q12H, Karleen Hampshire, Vijaya, MD, 100 mg at 05/06/22 1051   heparin injection 3,800 Units, 40 Units/kg, Dialysis, PRN, Elmarie Shiley, MD   hydrALAZINE (APRESOLINE) injection 10 mg, 10 mg, Intravenous, Q6H PRN, Hosie Poisson, MD, 10 mg at 05/05/22 2333   hydrOXYzine (ATARAX) tablet 25 mg, 25 mg, Oral, Q8H  PRN, Irene Pap N, DO, 25 mg at 05/04/22 2345   lip balm (CARMEX) ointment, , Topical, PRN, Kayleen Memos, DO, 75 application. at 05/04/22 0005   losartan (COZAAR) tablet 50 mg, 50 mg, Oral, Daily, Hosie Poisson, MD, 50 mg at 05/06/22 1051   pantoprazole (PROTONIX) EC tablet 40 mg, 40 mg, Oral, Daily, Hall, Carole N, DO, 40 mg at 05/06/22 1051   polyethylene glycol (MIRALAX / GLYCOLAX) packet 17 g, 17 g, Oral, Daily PRN, Hall, Carole N, DO   prochlorperazine (COMPAZINE) injection 5 mg, 5 mg, Intravenous, Q6H PRN, Hall, Carole N, DO   torsemide (DEMADEX) tablet 40 mg, 40 mg, Oral, Daily, Karleen Hampshire, Vijaya, MD, 40 mg at 05/06/22 1051   traMADol (ULTRAM) tablet 50 mg, 50 mg, Oral, Q12H PRN, Hosie Poisson, MD, 50 mg at 05/05/22 2337  Allergies: Allergies  Allergen  Reactions   Prozac [Fluoxetine Hcl] Other (See Comments)    Panic attack    Wellbutrin [Bupropion] Other (See Comments)    Panic attack   Prednisone Other (See Comments)    Pt states that this med caused pancreatitis.        Objective  Vital signs:  Temp:  [98 F (36.7 C)-98.3 F (36.8 C)] 98.1 F (36.7 C) (06/07 0741) Pulse Rate:  [99-108] 102 (06/07 0741) Resp:  [17-20] 19 (06/07 0741) BP: (86-173)/(53-107) 164/90 (06/07 0741) SpO2:  [95 %-97 %] 97 % (06/07 0254)  Psychiatric Specialty Exam:  Presentation  General Appearance: Casual  Eye Contact:Poor  Speech:Slow  Speech Volume:Decreased  Handedness:Right   Mood and Affect  Mood:Depressed  Affect:Congruent   Thought Process  Thought Processes:Coherent; Linear  Descriptions of Associations:Intact  Orientation:Full (Time, Place and Person)  Thought Content:Logical  History of Schizophrenia/Schizoaffective disorder:No data recorded Duration of Psychotic Symptoms:No data recorded Hallucinations:Hallucinations: None  Ideas of Reference:None  Suicidal Thoughts:Suicidal Thoughts: No  Homicidal Thoughts:Homicidal Thoughts: No   Sensorium  Memory:Immediate Fair; Recent Fair; Remote Fair  Judgment:Poor  Insight:Fair   Executive Functions  Concentration:Fair  Attention Span:Fair  [place]   Psychomotor Activity  Psychomotor Activity:Psychomotor Activity: Normal  Assets  Assets:Financial Resources/Insurance; Housing   Sleep  Sleep:Sleep: Fair   Physical Exam: Physical Exam Constitutional:      Appearance: the patient is not toxic-appearing.  Pulmonary:     Effort: Pulmonary effort is normal.  Neurological:     General: No focal deficit present.     Mental Status: the patient is alert and oriented to person, place, and time.   Review of Systems  Unable to complete  Blood pressure (!) 164/90, pulse (!) 102, temperature 98.1 F (36.7  C), temperature source Axillary, resp. rate 19, weight 93.8 kg, SpO2 97 %. Body mass index is 31.44 kg/m.   Corky Sox, MD PGY-1

## 2022-05-06 NOTE — Assessment & Plan Note (Signed)
-   Psychiatry following as she has been intermittently refusing care - No change in management at this time

## 2022-05-06 NOTE — Assessment & Plan Note (Addendum)
-   differential includes heat/chemical burn from cocaine vs ulcerative lesion/aphthous ulcer  - continue lip balm - add on acyclovir ointment to lip to see if improvement  - lip swab/culture growing MRSA? Unclear significance given skin swab; d/c doxy and monitor  -Acyclovir ointment prescribed at discharge

## 2022-05-06 NOTE — Assessment & Plan Note (Signed)
Continue amlodipine and losartan 

## 2022-05-06 NOTE — Assessment & Plan Note (Addendum)
-   mild swelling and pain in left ankle and left wrist - follow up GC/CHL testing; patient refused to supply urine sample - low suspicion for cellulitis at this time; d/c ampicillin

## 2022-05-06 NOTE — Assessment & Plan Note (Signed)
-   1/4 bottles from 6/4 noted with Staph epi, considered contaminate - no further workup

## 2022-05-06 NOTE — Assessment & Plan Note (Addendum)
-   denies discharge but notes recently new itching - check GC/CHL given history; last sexual partner reported per patient was 2-3 days prior to admission; unprotected consensual sex  -Patient did not supply a urine sample prior to discharge - HIV negative on admission  -Valtrex course prescribed at discharge

## 2022-05-06 NOTE — Hospital Course (Addendum)
Natasha Long is a 26 yo female with PMH borderline PD, previous suicide attempts, ESRD on HD, polysubstance abuse, HTN, septic arthritis, hx syphillis/gonorrhea, med and HD noncompliance who presented with headache, left wrist swelling and pain, left ankle swelling and pain, nausea, chills.  She had missed outpatient sessions of dialysis prior to admission. She was admitted and underwent inpatient dialysis for volume removal.  She was still noncompliant with medical recommendations during hospitalization and also ended dialysis session early.

## 2022-05-06 NOTE — Progress Notes (Shared)
   05/06/22 0254  Vitals  Temp 98 F (36.7 C)  Temp Source Oral  BP (!) 86/53  MAP (mmHg) (!) 63  BP Location Left Wrist  BP Method Automatic  Patient Position (if appropriate) Lying  Pulse Rate (!) 108  Pulse Rate Source Monitor  ECG Heart Rate (!) 104  Resp 19  Level of Consciousness  Level of Consciousness Alert  MEWS COLOR  MEWS Score Color Yellow  Oxygen Therapy  SpO2 97 %  O2 Device Room Air  Pain Assessment  Pain Scale 0-10  Pain Score Asleep  PCA/Epidural/Spinal Assessment  Respiratory Pattern Regular;Unlabored  Glasgow Coma Scale  Eye Opening 4  Best Verbal Response (NON-intubated) 5  Best Motor Response 6  Glasgow Coma Scale Score 15  MEWS Score  MEWS Temp 0  MEWS Systolic 1  MEWS Pulse 1  MEWS RR 0  MEWS LOC 0  MEWS Score 2  Provider Notification  Provider Name/Title T. Opyd, MD  Date Provider Notified 05/06/22  Time Provider Notified 0300  Method of Notification Page  Notification Reason Change in status  Provider response Other (Comment) (recheck BP in 15 mins)  Date of Provider Response 05/06/22  Time of Provider Response [phone]

## 2022-05-06 NOTE — Progress Notes (Signed)
Patient ID: Natasha Long, female   DOB: Mar 21, 1996, 26 y.o.   MRN: [id number] [facility] Progress Note   Assessment/ Plan:   1.  Altered mental status: Likely from uremic encephalopathy from severe azotemia/missing hemodialysis prior to hospitalization.  She does have an underlying history of depression/borderline personality disorder and was evaluated by the psychiatry resident without additional input/changes to management. 2. ESRD: On a Monday/Wednesday/Friday dialysis schedule and has been nonadherent with hemodialysis with similar hospitalizations in the past.  She refused hemodialysis yesterday and is on schedule for today-earlier refused but reports that she would be willing to undertake it later in the day. 3. Anemia: Hemoglobin and hematocrit currently at goal, no overt losses.  No current need for ESA. 4. CKD-MBD: Severe hyperphosphatemia noted due to nonadherence with hemodialysis/diet/binders.  Will discuss when mental status allows. 5. Nutrition: Continue renal diet with fluid restriction. 6. Hypertension: Blood pressures elevated, resume antihypertensive agents/hemodialysis.  Subjective:   Transient hypotension noted overnight-accuracy questioned.  Patient refused repeat vital signs.  Earlier this morning refused to go to hemodialysis and states that she is "willing to do it later".   Objective:   BP (!) 164/90 (BP Location: Right Wrist)   Pulse (!) 102   Temp 98.1 F (36.7 C) (Axillary)   Resp 19   Wt 93.8 kg   SpO2 97%   BMI 31.44 kg/m   Physical Exam: Gen: Sleeping soundly and difficult to arouse by calling her name CVS: Pulse regular tachycardia, S1 and S2 normal Resp: Clear to auscultation bilaterally, no distinct rales/rhonchi Abd: Soft, obese, nontender, bowel sounds normal Ext: Trace lower extremity edema, left upper arm AV fistula with intact dressings  Labs: BMET Recent Labs  Lab 05/02/22 2159 05/03/22 0454 05/03/22 0503 05/05/22 1231   NA 136 138 138 137  K 4.9 4.8 4.8 4.3  CL 99 101  --  100  CO2 15* 14*  --  23  GLUCOSE 94 91  --  82  BUN 112* 112*  --  48*  CREATININE 29.68* 29.67*  --  16.36*  CALCIUM 7.8* 7.7*  --  8.8*  PHOS  --  >30.0*  --   --    CBC Recent Labs  Lab 05/02/22 2159 05/03/22 0454 05/03/22 0503 05/05/22 1231  WBC 7.4 6.3  --  4.5  NEUTROABS  --  4.1  --  3.0  HGB 10.7* 10.0* 10.9* 12.2  HCT 34.0* 31.5* 32.0* 38.7  MCV 96.3 95.2  --  92.1  PLT 171 144*  --  174     Medications:     amLODipine  10 mg Oral Daily   ARIPiprazole  7.5 mg Oral Daily   calcium acetate  667 mg Oral TID WC   Chlorhexidine Gluconate Cloth  6 each Topical Q0600   doxycycline  100 mg Oral Q12H   losartan  50 mg Oral Daily   pantoprazole  40 mg Oral Daily   torsemide  40 mg Oral Daily   Elmarie Shiley, MD 05/06/2022, 8:08 AM

## 2022-05-06 NOTE — Assessment & Plan Note (Addendum)
-   Intermittently refusing dialysis but agreed for dialysis on 6/7 but ended session early - resume home schedule at discharge

## 2022-05-07 ENCOUNTER — Other Ambulatory Visit (HOSPITAL_COMMUNITY): Payer: Self-pay

## 2022-05-07 DIAGNOSIS — Z992 Dependence on renal dialysis: Secondary | ICD-10-CM

## 2022-05-07 DIAGNOSIS — N186 End stage renal disease: Secondary | ICD-10-CM | POA: Diagnosis not present

## 2022-05-07 DIAGNOSIS — R4182 Altered mental status, unspecified: Secondary | ICD-10-CM | POA: Diagnosis not present

## 2022-05-07 MED ORDER — CHLORHEXIDINE GLUCONATE CLOTH 2 % EX PADS: 6.0000 | MEDICATED_PAD | Freq: Every day | CUTANEOUS | Status: DC

## 2022-05-07 MED ORDER — LOSARTAN POTASSIUM 50 MG PO TABS
75.0000 mg | ORAL_TABLET | Freq: Every day | ORAL | Status: DC
  Administered 2022-05-07: 75 mg via ORAL
  Filled 2022-05-07: qty 1

## 2022-05-07 MED ORDER — ACYCLOVIR 5 % EX OINT
TOPICAL_OINTMENT | Freq: Four times a day (QID) | CUTANEOUS | 0 refills | Status: DC
  Filled 2022-05-07: qty 15, 15d supply, fill #0

## 2022-05-07 MED ORDER — VALACYCLOVIR HCL 500 MG PO TABS
500.0000 mg | ORAL_TABLET | Freq: Every day | ORAL | 0 refills | Status: AC
  Filled 2022-05-07: qty 7, 7d supply, fill #0

## 2022-05-07 MED ORDER — VALACYCLOVIR HCL 500 MG PO TABS
500.0000 mg | ORAL_TABLET | Freq: Every day | ORAL | Status: DC
  Filled 2022-05-07: qty 1

## 2022-05-07 NOTE — Plan of Care (Signed)
  Problem: Clinical Measurements: Goal: Ability to maintain clinical measurements within normal limits will improve Outcome: Progressing Goal: Will remain free from infection Outcome: Progressing   

## 2022-05-07 NOTE — Progress Notes (Signed)
Pt has refused vital signs and reassessment. Pt is lying down in bed chest rise and fall noted.

## 2022-05-07 NOTE — Progress Notes (Signed)
Patient ID: Natasha Long, female   DOB: 10/03/1996, 26 y.o.   MRN: [id number] [facility] Progress Note   Assessment/ Plan:   1.  Altered mental status: Likely from uremic encephalopathy from severe azotemia/missing hemodialysis prior to hospitalization.  She does have an underlying history of depression/borderline personality disorder and was evaluated by the psychiatry resident without additional input/changes to management. 2. ESRD: On a Monday/Wednesday/Friday dialysis schedule and has been nonadherent with hemodialysis with similar hospitalizations in the past.  Truncated hemodialysis treatment yesterday and will be placed on schedule for dialysis again tomorrow if she is still here in the hospital. 3. Anemia: Hemoglobin and hematocrit currently at goal, no overt losses.  No current need for ESA. 4. CKD-MBD: Severe hyperphosphatemia noted due to nonadherence with hemodialysis/diet/binders.  Will discuss when mental status allows. 5. Nutrition: Continue renal diet with fluid restriction. 6. Hypertension: Blood pressures elevated, resume antihypertensive agents/hemodialysis. 7. Herpes labialis and herpes genitalis: Started on topical acyclovir for her lips and will begin oral valacyclovir for herpes genitalis.  Subjective:   Truncated dialysis treatment yesterday because she wanted to walk to the bathroom rather than use bedpan.  Refused to sign treatment termination form.  She complains of genital discomfort and "feels like she is having a herpes rash down there"   Objective:   BP (!) 173/87 (BP Location: Right Arm)   Pulse 96   Temp 98.4 F (36.9 C) (Oral)   Resp 14   Wt 93.2 kg   SpO2 96%   BMI 31.24 kg/m   Physical Exam: Gen: Appears comfortable resting in bed, converses with eyes closed CVS: Pulse regular tachycardia, S1 and S2 normal Resp: Clear to auscultation bilaterally, no distinct rales/rhonchi Abd: Soft, obese, nontender, bowel sounds normal Ext: Trace  lower extremity edema, left upper arm AV fistula with intact dressings  Labs: BMET Recent Labs  Lab 05/02/22 2159 05/03/22 0454 05/03/22 0503 05/05/22 1231  NA 136 138 138 137  K 4.9 4.8 4.8 4.3  CL 99 101  --  100  CO2 15* 14*  --  23  GLUCOSE 94 91  --  82  BUN 112* 112*  --  48*  CREATININE 29.68* 29.67*  --  16.36*  CALCIUM 7.8* 7.7*  --  8.8*  PHOS  --  >30.0*  --   --    CBC Recent Labs  Lab 05/02/22 2159 05/03/22 0454 05/03/22 0503 05/05/22 1231  WBC 7.4 6.3  --  4.5  NEUTROABS  --  4.1  --  3.0  HGB 10.7* 10.0* 10.9* 12.2  HCT 34.0* 31.5* 32.0* 38.7  MCV 96.3 95.2  --  92.1  PLT 171 144*  --  174     Medications:     acyclovir ointment   Topical QID   amLODipine  10 mg Oral Daily   ARIPiprazole  7.5 mg Oral Daily   calcium acetate  667 mg Oral TID WC   Chlorhexidine Gluconate Cloth  6 each Topical Q0600   losartan  75 mg Oral Daily   pantoprazole  40 mg Oral Daily   torsemide  40 mg Oral Daily   valACYclovir  500 mg Oral Daily   Elmarie Shiley, MD 05/07/2022, 8:25 AM

## 2022-05-07 NOTE — Discharge Summary (Signed)
Physician Discharge Summary   [employer] Null WHQ:[id number] DOB: Mar 07, 1996 DOA: 05/02/2022  PCP: [name], MD  Admit date: 05/02/2022 Discharge date: 05/07/2022   Admitted From: Home Disposition:  Home Discharging physician: Dwyane Dee, MD  Recommendations for Outpatient Follow-up:  Continue HD May need further STI testing if ongoing symptoms  Home Health:  Equipment/Devices:   Discharge Condition: stable CODE STATUS: Full Diet recommendation:  Diet Orders (From admission, onward)     Start     Ordered   05/03/22 1156  Diet renal with fluid restriction Fluid restriction: 1200 mL Fluid; Room service appropriate? Yes; Fluid consistency: Thin  Diet effective now       Question Answer Comment  Fluid restriction: 1200 mL Fluid   Room service appropriate? Yes   Fluid consistency: Thin      05/03/22 1155            Hospital Course: Natasha Long is a 26 yo female with PMH borderline PD, previous suicide attempts, ESRD on HD, polysubstance abuse, HTN, septic arthritis, hx syphillis/gonorrhea, med and HD noncompliance who presented with headache, left wrist swelling and pain, left ankle swelling and pain, nausea, chills.  She had missed outpatient sessions of dialysis prior to admission. She was admitted and underwent inpatient dialysis for volume removal.  She was still noncompliant with medical recommendations during hospitalization and also ended dialysis session early.  Assessment and Plan: * AMS (altered mental status)-resolved as of 05/06/2022 - was suspected due to uremic encephalopathy; has improved with HD since admission   ESRD on dialysis [facility]) - Intermittently refusing dialysis but agreed for dialysis on 6/7 but ended session early - resume home schedule at discharge   Left ankle pain - mild swelling and pain in left ankle and left wrist - follow up GC/CHL testing; patient refused to supply urine sample - low suspicion for cellulitis at this time; d/c  ampicillin   Vaginal itching - denies discharge but notes recently new itching - check GC/CHL given history; last sexual partner reported per patient was 2-3 days prior to admission; unprotected consensual sex  -Patient did not supply a urine sample prior to discharge - HIV negative on admission  -Valtrex course prescribed at discharge  Lip lesion - differential includes heat/chemical burn from cocaine vs ulcerative lesion/aphthous ulcer  - continue lip balm - add on acyclovir ointment to lip to see if improvement  - lip swab/culture growing MRSA? Unclear significance given skin swab; d/c doxy and monitor  -Acyclovir ointment prescribed at discharge  Pericardial effusion without cardiac tamponade - Echocardiogram showed moderate pericardial effusion without signs of tamponade.  - Left ventricular ejection fraction, 45 to 50%, has mildly decreased function. Right ventricular systolic function is normal.  Moderate pericardial effusion, measures 1.1 cm adjacent to LV lateral wall. IVC fixed/dilated, but no RA/RV collapse seen to suggest tamponade per read - appreciate cardiology consult; no further workup needed; ongoing dialysis recommended   Contamination of blood culture - 1/4 bottles from 6/4 noted with Staph epi, considered contaminate - no further workup   Cocaine use, 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[place]) - Patient endorses crack cocaine and marijuana use prior to admission.  Counseling has been given  Borderline personality disorder [facility]) - Psychiatry following as she has been intermittently refusing care - No change in management at this time  Chronic hypertension - Continue amlodipine and losartan   The patient's chronic medical conditions were treated accordingly per the patient's home medication regimen except as noted.  On day of discharge, patient was felt deemed stable for discharge. Patient/family member advised to call PCP or come back to ER if needed.    Principal Diagnosis: AMS (altered mental status)  Discharge Diagnoses: Active Problems:   ESRD on dialysis [facility])   Lip lesion   Vaginal itching   Left ankle pain   Pericardial effusion without cardiac tamponade   Chronic hypertension   Borderline personality disorder (HCC)   Cocaine use, 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HCC)   Anemia   Anemia in chronic kidney disease   Uremia   Contamination of blood culture   Discharge Instructions     Increase activity slowly   Complete by: As directed       Allergies as of 05/07/2022       Reactions   Prozac [fluoxetine Hcl] Other (See Comments)   Panic attack    Wellbutrin [bupropion] Other (See Comments)   Panic attack   Prednisone Other (See Comments)   Pt states that this med caused pancreatitis.         Medication List     TAKE these medications    acetaminophen 500 MG tablet Commonly known as: TYLENOL Take 2 tablets (1,000 mg total) by mouth every 8 (eight) hours.   acyclovir ointment 5 % Commonly known as: ZOVIRAX Apply topically 4 (four) times daily.   ARIPiprazole 15 MG tablet Commonly known as: ABILIFY Take 0.5 tablets (7.5 mg total) by mouth daily.   Blood Pressure Monitor 7 Devi 1 Units by Does not apply route daily. Measure blood pressure daily   calcitRIOL 0.25 MCG capsule Commonly known as: ROCALTROL Take 0.25 mcg by mouth Every Tuesday,Thursday,and Saturday with dialysis.   calcium acetate 667 MG capsule Commonly known as: PHOSLO Take 667 mg by mouth 3 (three) times daily.   Darbepoetin Alfa 150 MCG/0.3ML Sosy injection Commonly known as: ARANESP Inject 0.3 mLs (150 mcg total) into the vein every Saturday with hemodialysis.   diclofenac Sodium 1 % Gel Commonly known as: VOLTAREN Apply 4 g topically 4 (four) times daily as needed (Pain to L wrist and R knee).   diphenhydrAMINE-zinc acetate cream Commonly known as: BENADRYL Apply 1 application. topically 2 (two) times daily  as needed for itching.   gabapentin 600 MG tablet Commonly known as: NEURONTIN Take 300 mg by mouth 3 (three) times daily. What changed: Another medication with the same name was removed. Continue taking this medication, and follow the directions you see here.   hydrocerin Crea Apply 1 application. topically 2 (two) times daily.   HYDROcodone-acetaminophen 5-325 MG tablet Commonly known as: NORCO/VICODIN Take 1 tablet by mouth every 6 (six) hours as needed.   hydrOXYzine 25 MG capsule Commonly known as: VISTARIL Take 1 capsule (25 mg total) by mouth every 8 (eight) hours as needed for anxiety.   lidocaine-prilocaine cream Commonly known as: EMLA Apply 1 application topically daily as needed (port site access on dialysis days (Tuesday's, Thursday's, and Saturday's).   losartan 50 MG tablet Commonly known as: COZAAR Take 50 mg by mouth daily.   methocarbamol 500 MG tablet Commonly known as: ROBAXIN Take 2 tablets (1,000 mg total) by mouth at bedtime.   omeprazole 20 MG capsule Commonly known as: PRILOSEC TAKE 1 CAPSULE BY MOUTH EVERY DAY What changed: how much to take   polyethylene glycol 17 g packet Commonly known as: MIRALAX / GLYCOLAX Take 17 g by mouth 2 (two) times daily.   ramelteon 8 MG tablet Commonly known as: ROZEREM  Take 1 tablet (8 mg total) by mouth at bedtime.   Renal Vitamin 0.8 MG Tabs Take 1 tablet by mouth daily.   senna-docusate 8.6-50 MG tablet Commonly known as: Senokot-S Take 2 tablets by mouth 2 (two) times daily.   sevelamer carbonate 800 MG tablet Commonly known as: RENVELA Take 2 tablets (1,600 mg total) by mouth 3 (three) times daily with meals.   torsemide 100 MG tablet Commonly known as: DEMADEX Take 100 mg by mouth every morning.   valACYclovir 500 MG tablet Commonly known as: VALTREX Take 1 tablet (500 mg total) by mouth daily for 7 days.        Allergies  Allergen Reactions   Prozac [Fluoxetine Hcl] Other (See  Comments)    Panic attack    Wellbutrin [Bupropion] Other (See Comments)    Panic attack   Prednisone Other (See Comments)    Pt states that this med caused pancreatitis.     Consultations:   Procedures:   Discharge Exam: BP (!) 173/87 (BP Location: Right Arm)   Pulse 96   Temp 98.4 F (36.9 C) (Oral)   Resp 14   Wt 93.2 kg   SpO2 96%   BMI 31.24 kg/m  Physical Exam Constitutional:      General: She is not in acute distress.    Appearance: Normal appearance.  HENT:     Head:     Comments: Lower lip noted with almost ulcerated lesions and scabbing from recent bleeding    Mouth/Throat:     Mouth: Mucous membranes are moist.  Eyes:     Extraocular Movements: Extraocular movements intact.  Cardiovascular:     Rate and Rhythm: Normal rate and regular rhythm.  Pulmonary:     Effort: Pulmonary effort is normal.     Breath sounds: Normal breath sounds.  Abdominal:     General: Bowel sounds are normal. There is no distension.     Palpations: Abdomen is soft.     Tenderness: There is no abdominal tenderness.  Musculoskeletal:     Cervical back: Normal range of motion and neck supple.     Comments: Approximately 1+ left ankle edema with painful ROM.  Left wrist also noted with no edema but has painful ROM.  No perceived erythema, calor with either joint  Neurological:     Mental Status: She is alert.      The results of significant diagnostics from this hospitalization (including imaging, microbiology, ancillary and laboratory) are listed below for reference.   Microbiology: Recent Results (from the past 240 hour(s))  Culture, blood (Routine X 2) w Reflex to ID Panel     Status: Abnormal   Collection Time: 05/03/22  5:05 AM   Specimen: BLOOD  Result Value Ref Range Status   Specimen Description BLOOD SITE NOT SPECIFIED  Final   Special Requests   Final    BOTTLES DRAWN AEROBIC AND ANAEROBIC Blood Culture adequate volume   Culture  Setup Time   Final    GRAM  POSITIVE COCCI AEROBIC BOTTLE ONLY CRITICAL RESULT CALLED TO, READ BACK BY AND VERIFIED WITH: PHARMD C.PIERCE AT 1839 ON 05/04/2022 BY DRT.    Culture (A)  Final    STAPHYLOCOCCUS EPIDERMIDIS THE SIGNIFICANCE OF ISOLATING THIS ORGANISM FROM A SINGLE SET OF BLOOD CULTURES WHEN MULTIPLE SETS ARE DRAWN IS UNCERTAIN. PLEASE NOTIFY THE MICROBIOLOGY DEPARTMENT WITHIN ONE WEEK IF SPECIATION AND SENSITIVITIES ARE REQUIRED. Performed at [facility], [place] [address] [place], [place] [address]  Report Status 05/06/2022 FINAL  Final  Blood Culture ID Panel (Reflexed)     Status: Abnormal   Collection Time: 05/03/22  5:05 AM  Result Value Ref Range Status   Enterococcus faecalis NOT DETECTED NOT DETECTED Final   Enterococcus Faecium NOT DETECTED NOT DETECTED Final   Listeria monocytogenes NOT DETECTED NOT DETECTED Final   Staphylococcus species DETECTED (A) NOT DETECTED Final    Comment: CRITICAL RESULT CALLED TO, READ BACK BY AND VERIFIED WITH: CATHY PIERCE ON 05/04/22 @ 1839 BY DRT    Staphylococcus aureus (BCID) NOT DETECTED NOT DETECTED Final   Staphylococcus epidermidis DETECTED (A) NOT DETECTED Final    Comment: Methicillin (oxacillin) resistant coagulase negative staphylococcus. Possible blood culture contaminant (unless isolated from more than one blood culture draw or clinical case suggests pathogenicity). No antibiotic treatment is indicated for blood  culture contaminants. CRITICAL RESULT CALLED TO, READ BACK BY AND VERIFIED WITH: CATHY PIERCE ON 05/04/22 @ 1839 BY DRT    Staphylococcus lugdunensis NOT DETECTED NOT DETECTED Final   Streptococcus species NOT DETECTED NOT DETECTED Final   Streptococcus agalactiae NOT DETECTED NOT DETECTED Final   Streptococcus pneumoniae NOT DETECTED NOT DETECTED Final   Streptococcus pyogenes NOT DETECTED NOT DETECTED Final   A.calcoaceticus-baumannii NOT DETECTED NOT DETECTED Final   Bacteroides fragilis NOT DETECTED NOT DETECTED Final    Enterobacterales NOT DETECTED NOT DETECTED Final   Enterobacter cloacae complex NOT DETECTED NOT DETECTED Final   Escherichia coli NOT DETECTED NOT DETECTED Final   Klebsiella aerogenes NOT DETECTED NOT DETECTED Final   Klebsiella oxytoca NOT DETECTED NOT DETECTED Final   Klebsiella pneumoniae NOT DETECTED NOT DETECTED Final   Proteus species NOT DETECTED NOT DETECTED Final   Salmonella species NOT DETECTED NOT DETECTED Final   Serratia marcescens NOT DETECTED NOT DETECTED Final   Haemophilus influenzae NOT DETECTED NOT DETECTED Final   Neisseria meningitidis NOT DETECTED NOT DETECTED Final   Pseudomonas aeruginosa NOT DETECTED NOT DETECTED Final   Stenotrophomonas maltophilia NOT DETECTED NOT DETECTED Final   Candida albicans NOT DETECTED NOT DETECTED Final   Candida auris NOT DETECTED NOT DETECTED Final   Candida glabrata NOT DETECTED NOT DETECTED Final   Candida krusei NOT DETECTED NOT DETECTED Final   Candida parapsilosis NOT DETECTED NOT DETECTED Final   Candida tropicalis NOT DETECTED NOT DETECTED Final   Cryptococcus neoformans/gattii NOT DETECTED NOT DETECTED Final   Methicillin resistance mecA/C DETECTED (A) NOT DETECTED Final    Comment: CRITICAL RESULT CALLED TO, READ BACK BY AND VERIFIED WITH: CATHY PIERCE ON 05/04/22 @ 1839 BY DRT Performed at [facility], [address], [place], [place] [address]   Culture, blood (Routine X 2) w Reflex to ID Panel     Status: None (Preliminary result)   Collection Time: 05/03/22  5:10 AM   Specimen: BLOOD  Result Value Ref Range Status   Specimen Description BLOOD SITE NOT SPECIFIED  Final   Special Requests   Final    BOTTLES DRAWN AEROBIC AND ANAEROBIC Blood Culture adequate volume   Culture   Final    NO GROWTH 4 DAYS Performed at [facility], [address], [place], [place] [address]    Report Status PENDING  Incomplete  Aerobic Culture w Gram Stain (superficial specimen)     Status: None   Collection Time:  05/03/22  2:24 PM   Specimen: Tissue  Result Value Ref Range Status   Specimen Description TISSUE  Final   Special Requests  LIP  Final   Gram Stain   Final    NO WBC SEEN NO ORGANISMS SEEN Performed at [facility], [address] [address], [place], [place] [address]    Culture   Final    MODERATE METHICILLIN RESISTANT STAPHYLOCOCCUS AUREUS   Report Status 05/05/2022 FINAL  Final   Organism ID, Bacteria METHICILLIN RESISTANT STAPHYLOCOCCUS AUREUS  Final      Susceptibility   Methicillin resistant staphylococcus aureus - MIC*    CIPROFLOXACIN <=0.5 SENSITIVE Sensitive     ERYTHROMYCIN >=8 RESISTANT Resistant     GENTAMICIN <=0.5 SENSITIVE Sensitive     OXACILLIN >=4 RESISTANT Resistant     TETRACYCLINE <=1 SENSITIVE Sensitive     VANCOMYCIN <=0.5 SENSITIVE Sensitive     TRIMETH/SULFA <=10 SENSITIVE Sensitive     CLINDAMYCIN <=0.25 SENSITIVE Sensitive     RIFAMPIN <=0.5 SENSITIVE Sensitive     Inducible Clindamycin NEGATIVE Sensitive     * MODERATE METHICILLIN RESISTANT STAPHYLOCOCCUS AUREUS  Culture, blood (Routine X 2) w Reflex to ID Panel     Status: None (Preliminary result)   Collection Time: 05/05/22 12:33 PM   Specimen: BLOOD  Result Value Ref Range Status   Specimen Description BLOOD BLOOD RIGHT HAND  Final   Special Requests   Final    BOTTLES DRAWN AEROBIC AND ANAEROBIC Blood Culture adequate volume   Culture   Final    NO GROWTH 2 DAYS Performed at [facility], [address], [place], [place] [address]    Report Status PENDING  Incomplete     Labs: BNP (last 3 results) Recent Labs    12/23/21 0416  BNP 979.8*   Basic Metabolic Panel: Recent Labs  Lab 05/02/22 2159 05/03/22 0454 05/03/22 0503 05/05/22 1231  NA 136 138 138 137  K 4.9 4.8 4.8 4.3  CL 99 101  --  100  CO2 15* 14*  --  23  GLUCOSE 94 91  --  82  BUN 112* 112*  --  48*  CREATININE 29.68* 29.67*  --  16.36*  CALCIUM 7.8* 7.7*  --  8.8*  MG  --  2.6*  --   --   PHOS  --  >30.0*   --   --    Liver Function Tests: Recent Labs  Lab 05/03/22 0454  ALBUMIN 3.0*   No results for input(s): "LIPASE", "AMYLASE" in the last 168 hours. Recent Labs  Lab 05/03/22 0051  AMMONIA 23   CBC: Recent Labs  Lab 05/02/22 2159 05/03/22 0454 05/03/22 0503 05/05/22 1231  WBC 7.4 6.3  --  4.5  NEUTROABS  --  4.1  --  3.0  HGB 10.7* 10.0* 10.9* 12.2  HCT 34.0* 31.5* 32.0* 38.7  MCV 96.3 95.2  --  92.1  PLT 171 144*  --  174   Cardiac Enzymes: No results for input(s): "CKTOTAL", "CKMB", "CKMBINDEX", "TROPONINI" in the last 168 hours. BNP: Invalid input(s): "POCBNP" CBG: Recent Labs  Lab 05/02/22 2352  GLUCAP 99   D-Dimer No results for input(s): "DDIMER" in the last 72 hours. Hgb A1c No results for input(s): "HGBA1C" in the last 72 hours. Lipid Profile No results for input(s): "CHOL", "HDL", "LDLCALC", "TRIG", "CHOLHDL", "LDLDIRECT" in the last 72 hours. Thyroid function studies No results for input(s): "TSH", "T4TOTAL", "T3FREE", "THYROIDAB" in the last 72 hours.  Invalid input(s): "FREET3" Anemia work up No results for input(s): "VITAMINB12", "FOLATE", "FERRITIN", "TIBC", "IRON", "RETICCTPCT" in the last 72 hours. Urinalysis  Component Value Date/Time   COLORURINE YELLOW 06/02/2021 1221   APPEARANCEUR CLOUDY (A) 06/02/2021 1221   LABSPEC 1.010 06/02/2021 1221   PHURINE 7.0 06/02/2021 1221   GLUCOSEU 50 (A) 06/02/2021 1221   HGBUR NEGATIVE 06/02/2021 1221   BILIRUBINUR NEGATIVE 06/02/2021 1221   BILIRUBINUR negative 01/30/2020 1526   BILIRUBINUR neg 09/15/2017 1657   KETONESUR NEGATIVE 06/02/2021 1221   PROTEINUR 100 (A) 06/02/2021 1221   UROBILINOGEN 0.2 01/30/2020 1526   NITRITE NEGATIVE 06/02/2021 1221   LEUKOCYTESUR NEGATIVE 06/02/2021 1221   Sepsis Labs Recent Labs  Lab 05/02/22 2159 05/03/22 0454 05/05/22 1231  WBC 7.4 6.3 4.5   Microbiology Recent Results (from the past 240 hour(s))  Culture, blood (Routine X 2) w Reflex to ID Panel      Status: Abnormal   Collection Time: 05/03/22  5:05 AM   Specimen: BLOOD  Result Value Ref Range Status   Specimen Description BLOOD SITE NOT SPECIFIED  Final   Special Requests   Final    BOTTLES DRAWN AEROBIC AND ANAEROBIC Blood Culture adequate volume   Culture  Setup Time   Final    GRAM POSITIVE COCCI AEROBIC BOTTLE ONLY CRITICAL RESULT CALLED TO, READ BACK BY AND VERIFIED WITH: PHARMD C.PIERCE AT 1839 ON 05/04/2022 BY DRT.    Culture (A)  Final    STAPHYLOCOCCUS EPIDERMIDIS THE SIGNIFICANCE OF ISOLATING THIS ORGANISM FROM A SINGLE SET OF BLOOD CULTURES WHEN MULTIPLE SETS ARE DRAWN IS UNCERTAIN. PLEASE NOTIFY THE MICROBIOLOGY DEPARTMENT WITHIN ONE WEEK IF SPECIATION AND SENSITIVITIES ARE REQUIRED. Performed at [facility], [place] [address] [place], [place] [address]    Report Status 05/06/2022 FINAL  Final  Blood Culture ID Panel (Reflexed)     Status: Abnormal   Collection Time: 05/03/22  5:05 AM  Result Value Ref Range Status   Enterococcus faecalis NOT DETECTED NOT DETECTED Final   Enterococcus Faecium NOT DETECTED NOT DETECTED Final   Listeria monocytogenes NOT DETECTED NOT DETECTED Final   Staphylococcus species DETECTED (A) NOT DETECTED Final    Comment: CRITICAL RESULT CALLED TO, READ BACK BY AND VERIFIED WITH: CATHY PIERCE ON 05/04/22 @ 1839 BY DRT    Staphylococcus aureus (BCID) NOT DETECTED NOT DETECTED Final   Staphylococcus epidermidis DETECTED (A) NOT DETECTED Final    Comment: Methicillin (oxacillin) resistant coagulase negative staphylococcus. Possible blood culture contaminant (unless isolated from more than one blood culture draw or clinical case suggests pathogenicity). No antibiotic treatment is indicated for blood  culture contaminants. CRITICAL RESULT CALLED TO, READ BACK BY AND VERIFIED WITH: CATHY PIERCE ON 05/04/22 @ 1839 BY DRT    Staphylococcus lugdunensis NOT DETECTED NOT DETECTED Final   Streptococcus species NOT DETECTED NOT DETECTED Final    Streptococcus agalactiae NOT DETECTED NOT DETECTED Final   Streptococcus pneumoniae NOT DETECTED NOT DETECTED Final   Streptococcus pyogenes NOT DETECTED NOT DETECTED Final   A.calcoaceticus-baumannii NOT DETECTED NOT DETECTED Final   Bacteroides fragilis NOT DETECTED NOT DETECTED Final   Enterobacterales NOT DETECTED NOT DETECTED Final   Enterobacter cloacae complex NOT DETECTED NOT DETECTED Final   Escherichia coli NOT DETECTED NOT DETECTED Final   Klebsiella aerogenes NOT DETECTED NOT DETECTED Final   Klebsiella oxytoca NOT DETECTED NOT DETECTED Final   Klebsiella pneumoniae NOT DETECTED NOT DETECTED Final   Proteus species NOT DETECTED NOT DETECTED Final   Salmonella species NOT DETECTED NOT DETECTED Final   Serratia marcescens NOT DETECTED NOT DETECTED Final   Haemophilus influenzae NOT DETECTED NOT  DETECTED Final   Neisseria meningitidis NOT DETECTED NOT DETECTED Final   Pseudomonas aeruginosa NOT DETECTED NOT DETECTED Final   Stenotrophomonas maltophilia NOT DETECTED NOT DETECTED Final   Candida albicans NOT DETECTED NOT DETECTED Final   Candida auris NOT DETECTED NOT DETECTED Final   Candida glabrata NOT DETECTED NOT DETECTED Final   Candida krusei NOT DETECTED NOT DETECTED Final   Candida parapsilosis NOT DETECTED NOT DETECTED Final   Candida tropicalis NOT DETECTED NOT DETECTED Final   Cryptococcus neoformans/gattii NOT DETECTED NOT DETECTED Final   Methicillin resistance mecA/C DETECTED (A) NOT DETECTED Final    Comment: CRITICAL RESULT CALLED TO, READ BACK BY AND VERIFIED WITH: CATHY PIERCE ON 05/04/22 @ 1839 BY DRT Performed at [facility], [address], [place], [place] [address]   Culture, blood (Routine X 2) w Reflex to ID Panel     Status: None (Preliminary result)   Collection Time: 05/03/22  5:10 AM   Specimen: BLOOD  Result Value Ref Range Status   Specimen Description BLOOD SITE NOT SPECIFIED  Final   Special Requests   Final    BOTTLES DRAWN AEROBIC  AND ANAEROBIC Blood Culture adequate volume   Culture   Final    NO GROWTH 4 DAYS Performed at [facility], [address], [place], [place] [address]    Report Status PENDING  Incomplete  Aerobic Culture w Gram Stain (superficial specimen)     Status: None   Collection Time: 05/03/22  2:24 PM   Specimen: Tissue  Result Value Ref Range Status   Specimen Description TISSUE  Final   Special Requests LIP  Final   Gram Stain   Final    NO WBC SEEN NO ORGANISMS SEEN Performed at [facility], [address] [place], [place] [address]    Culture   Final    MODERATE METHICILLIN RESISTANT STAPHYLOCOCCUS AUREUS   Report Status 05/05/2022 FINAL  Final   Organism ID, Bacteria METHICILLIN RESISTANT STAPHYLOCOCCUS AUREUS  Final      Susceptibility   Methicillin resistant staphylococcus aureus - MIC*    CIPROFLOXACIN <=0.5 SENSITIVE Sensitive     ERYTHROMYCIN >=8 RESISTANT Resistant     GENTAMICIN <=0.5 SENSITIVE Sensitive     OXACILLIN >=4 RESISTANT Resistant     TETRACYCLINE <=1 SENSITIVE Sensitive     VANCOMYCIN <=0.5 SENSITIVE Sensitive     TRIMETH/SULFA <=10 SENSITIVE Sensitive     CLINDAMYCIN <=0.25 SENSITIVE Sensitive     RIFAMPIN <=0.5 SENSITIVE Sensitive     Inducible Clindamycin NEGATIVE Sensitive     * MODERATE METHICILLIN RESISTANT STAPHYLOCOCCUS AUREUS  Culture, blood (Routine X 2) w Reflex to ID Panel     Status: None (Preliminary result)   Collection Time: 05/05/22 12:33 PM   Specimen: BLOOD  Result Value Ref Range Status   Specimen Description BLOOD BLOOD RIGHT HAND  Final   Special Requests   Final    BOTTLES DRAWN AEROBIC AND ANAEROBIC Blood Culture adequate volume   Culture   Final    NO GROWTH 2 DAYS Performed at [facility], [address], [place], [place] [address]    Report Status PENDING  Incomplete    Procedures/Studies: DG Ankle Complete Left  Result Date: 05/04/2022 CLINICAL DATA:  [id number]; pain EXAM: LEFT ANKLE COMPLETE - 3+ VIEW  COMPARISON:  Radiograph dated December 20, 2018 FINDINGS: No acute fracture or dislocation. Joint spaces and alignment are maintained. No area of erosion or osseous  destruction. No unexpected radiopaque foreign body. Soft tissues are unremarkable. IMPRESSION: No acute fracture or dislocation. Electronically Signed   By: Valentino Saxon M.D.   On: 05/04/2022 12:55   ECHOCARDIOGRAM LIMITED  Result Date: 05/03/2022    ECHOCARDIOGRAM LIMITED REPORT   Patient Name:   Natasha Long Date of Exam: 05/03/2022 Medical Rec #:  [id number]         Height:       68.0 in Accession #:    [id number]        Weight:       205.0 lb Date of Birth:  October 28, 1996         BSA:          2.065 m Patient Age:    25 years          BP:           162/99 mmHg Patient Gender: F                 HR:           100 bpm. Exam Location:  Inpatient Procedure: Limited Echo, Color Doppler and Cardiac Doppler Indications:    I31.3 Pericardial effusion  History:        Patient has prior history of Echocardiogram examinations, most                 recent 12/24/2021. Risk Factors:Hypertension and Polysubstance                 abuse. ESRD.  Sonographer:    Raquel Sarna Senior RDCS Referring Phys: [id number] [place]  1. Left ventricular ejection fraction, by estimation, is 45 to 50%. The left ventricle has mildly decreased function.  2. Right ventricular systolic function is normal. The right ventricular size is normal.  3. The inferior vena cava is dilated in size with <50% respiratory variability, suggesting right atrial pressure of 15 mmHg.  4. Moderate pericardial effusion, measures 1.1 cm adjacent to LV lateral wall. IVC fixed/dilated, but no RA/RV collapse seen to suggest tamponade FINDINGS  Left Ventricle: Left ventricular ejection fraction, by estimation, is 45 to 50%. The left ventricle has mildly decreased function. Right Ventricle: The right ventricular size is normal. Right ventricular systolic function is normal. Pericardium: A moderately  sized pericardial effusion is present. Venous: The inferior vena cava is dilated in size with less than 50% respiratory variability, suggesting right atrial pressure of 15 mmHg. Oswaldo Milian MD Electronically signed by Oswaldo Milian MD Signature Date/Time: 05/03/2022/1:39:07 PM    Final    CT Head Wo Contrast  Result Date: 05/03/2022 CLINICAL DATA:  Mental status change, unknown cause Patient reports headache. EXAM: CT HEAD WITHOUT CONTRAST TECHNIQUE: Contiguous axial images were obtained from the base of the skull through the vertex without intravenous contrast. RADIATION DOSE REDUCTION: This exam was performed according to the departmental dose-optimization program which includes automated exposure control, adjustment of the mA and/or kV according to patient size and/or use of iterative reconstruction technique. COMPARISON:  10/18/2021 FINDINGS: Brain: No intracranial hemorrhage, mass effect, or midline shift. No hydrocephalus. The basilar cisterns are patent. No evidence of territorial infarct or acute ischemia. No extra-axial or intracranial fluid collection. Vascular: No hyperdense vessel or unexpected calcification. Skull: No fracture or focal lesion. Sinuses/Orbits: Mild mucosal thickening of ethmoid air cells. No sinus fluid levels. Mastoid air cells are clear Other: None. IMPRESSION: 1. No acute intracranial abnormality. 2. Mild mucosal thickening of ethmoid air cells. Electronically Signed  By: Keith Rake M.D.   On: 05/03/2022 00:56   DG Chest Port 1 View  Result Date: 05/03/2022 CLINICAL DATA:  Altered mental status EXAM: PORTABLE CHEST 1 VIEW COMPARISON:  12/23/2021, 06/20/2021 FINDINGS: Interval moderate to marked cardiomegaly with vascular congestion and mild edema. Globular cardiac configuration. No pleural effusion or pneumothorax IMPRESSION: 1. Interval cardiomegaly with vascular congestion and mild edema. Globular cardiac configuration could be secondary to multi chamber  enlargement versus pericardial effusion. Consider correlation with echocardiogram Electronically Signed   By: Donavan Foil M.D.   On: 05/03/2022 00:36     Time coordinating discharge: Over 30 minutes    Dwyane Dee, MD  [facility] 05/07/2022, 4:37 PM

## 2022-05-07 NOTE — TOC Transition Note (Signed)
Transition of Care [facility]) - CM/SW Discharge Note   Patient Details  Name: Natasha Long MRN: [id number] Date of Birth: 10/04/1996  Transition of Care [facility]) CM/SW Contact:  Angelita Ingles, RN Phone Number:[phone]  05/07/2022, 9:18 AM   Clinical Narrative:    Patient with d/c orders. No TOC needs noted. TOC will sign off.          Patient Goals and CMS Choice        Discharge Placement                       Discharge Plan and Services                                     Social Determinants of Health (SDOH) Interventions     Readmission Risk Interventions     No data to display

## 2022-05-07 NOTE — Progress Notes (Addendum)
Pt has refused vital signs and reassessment. Pt is lying down in bed chest rise and fall noted.

## 2022-05-08 ENCOUNTER — Telehealth: Payer: Self-pay

## 2022-05-08 LAB — CULTURE, BLOOD (ROUTINE X 2)
Culture: NO GROWTH
Special Requests: ADEQUATE

## 2022-05-08 NOTE — TOC Transition Note (Signed)
Transition of care contact from inpatient facility  Date of discharge: 05/07/22 Date of contact: 05/08/22 Method: Phone Spoke to: Patient  Patient contacted to discuss transition of care from recent inpatient hospitalization. Patient was admitted to [facility] from 05/02/22-05/07/22 with discharge diagnosis of uremic encephalopathy, left ankle pain, pericardial effusion, and cocaine use.  Medication changes were reviewed.  Patient actually outpatient HD on TTS (confirmed with charge HD RN at [facility]). Patient will follow up with her outpatient HD unit on: Saturday June 10 at [facility].  Tobie Poet, NP

## 2022-05-08 NOTE — Telephone Encounter (Signed)
Transition Care Management Unsuccessful Follow-up Telephone Call  Date of discharge and from where:  [facility] on 05/07/2022  Attempts:  1st Attempt  Reason for unsuccessful TCM follow-up call: called pt unable to perform TOC call pt is not available at this time.  HFU appt with PA Levada Dy on 06/17/2022

## 2022-05-10 LAB — CULTURE, BLOOD (ROUTINE X 2)
Culture: NO GROWTH
Special Requests: ADEQUATE

## 2022-05-11 ENCOUNTER — Telehealth: Payer: Self-pay

## 2022-05-11 LAB — GC/CHLAMYDIA PROBE AMP (~~LOC~~) NOT AT ARMC
Chlamydia: NEGATIVE
Comment: NEGATIVE
Comment: NORMAL
Neisseria Gonorrhea: NEGATIVE

## 2022-05-11 NOTE — Telephone Encounter (Signed)
Transition Care Management Unsuccessful Follow-up Telephone Call  Date of discharge and from where:  05/07/2022 , [facility]  Attempts:  2nd Attempt  Reason for unsuccessful TCM follow-up call:  Left voice message # [phone]  She has a follow up appointment with Freeman Caldron, PA at [facility]- 06/17/2022.

## 2022-05-12 ENCOUNTER — Telehealth: Payer: Self-pay

## 2022-05-12 NOTE — Telephone Encounter (Signed)
Transition Care Management Unsuccessful Follow-up Telephone Call  Date of discharge and from where:  05/07/2022, [facility]  Attempts:  3rd Attempt  Reason for unsuccessful TCM follow-up call:  Left voice message on # [phone], call back requested.   She has a follow up appointment with Freeman Caldron, PA at [facility]- 06/17/2022.

## 2022-05-14 ENCOUNTER — Ambulatory Visit (HOSPITAL_COMMUNITY): Payer: Medicaid Other | Admitting: Physician Assistant

## 2022-05-15 ENCOUNTER — Emergency Department (HOSPITAL_COMMUNITY): Payer: Medicaid Other

## 2022-05-15 ENCOUNTER — Other Ambulatory Visit: Payer: Self-pay

## 2022-05-15 ENCOUNTER — Encounter (HOSPITAL_COMMUNITY): Payer: Self-pay

## 2022-05-15 ENCOUNTER — Inpatient Hospital Stay (HOSPITAL_COMMUNITY)
Admission: EM | Admit: 2022-05-15 | Discharge: 2022-05-16 | DRG: 682 | Discharge status: Against Medical Advice | Payer: Medicaid Other | Attending: Internal Medicine | Admitting: Internal Medicine

## 2022-05-15 DIAGNOSIS — Z7251 High risk heterosexual behavior: Secondary | ICD-10-CM | POA: Diagnosis not present

## 2022-05-15 DIAGNOSIS — M79669 Pain in unspecified lower leg: Secondary | ICD-10-CM | POA: Diagnosis present

## 2022-05-15 DIAGNOSIS — B9562 Methicillin resistant Staphylococcus aureus infection as the cause of diseases classified elsewhere: Secondary | ICD-10-CM | POA: Diagnosis present

## 2022-05-15 DIAGNOSIS — D631 Anemia in chronic kidney disease: Secondary | ICD-10-CM | POA: Diagnosis present

## 2022-05-15 DIAGNOSIS — G4700 Insomnia, unspecified: Secondary | ICD-10-CM | POA: Diagnosis present

## 2022-05-15 DIAGNOSIS — K130 Diseases of lips: Secondary | ICD-10-CM | POA: Diagnosis present

## 2022-05-15 DIAGNOSIS — N186 End stage renal disease: Secondary | ICD-10-CM | POA: Diagnosis present

## 2022-05-15 DIAGNOSIS — N2581 Secondary hyperparathyroidism of renal origin: Secondary | ICD-10-CM | POA: Diagnosis present

## 2022-05-15 DIAGNOSIS — F1910 Other psychoactive substance abuse, uncomplicated: Secondary | ICD-10-CM | POA: Diagnosis present

## 2022-05-15 DIAGNOSIS — I10 Essential (primary) hypertension: Secondary | ICD-10-CM | POA: Diagnosis present

## 2022-05-15 DIAGNOSIS — Z888 Allergy status to other drugs, medicaments and biological substances status: Secondary | ICD-10-CM

## 2022-05-15 DIAGNOSIS — Z992 Dependence on renal dialysis: Secondary | ICD-10-CM

## 2022-05-15 DIAGNOSIS — Z833 Family history of diabetes mellitus: Secondary | ICD-10-CM | POA: Diagnosis not present

## 2022-05-15 DIAGNOSIS — L0100 Impetigo, unspecified: Secondary | ICD-10-CM | POA: Diagnosis present

## 2022-05-15 DIAGNOSIS — F603 Borderline personality disorder: Secondary | ICD-10-CM | POA: Diagnosis present

## 2022-05-15 DIAGNOSIS — F17210 Nicotine dependence, cigarettes, uncomplicated: Secondary | ICD-10-CM | POA: Diagnosis present

## 2022-05-15 DIAGNOSIS — Z79899 Other long term (current) drug therapy: Secondary | ICD-10-CM

## 2022-05-15 DIAGNOSIS — F4310 Post-traumatic stress disorder, unspecified: Secondary | ICD-10-CM | POA: Diagnosis present

## 2022-05-15 DIAGNOSIS — Z6831 Body mass index (BMI) 31.0-31.9, adult: Secondary | ICD-10-CM

## 2022-05-15 DIAGNOSIS — N19 Unspecified kidney failure: Secondary | ICD-10-CM | POA: Diagnosis present

## 2022-05-15 DIAGNOSIS — I3139 Other pericardial effusion (noninflammatory): Secondary | ICD-10-CM | POA: Diagnosis present

## 2022-05-15 DIAGNOSIS — E8770 Fluid overload, unspecified: Secondary | ICD-10-CM | POA: Diagnosis present

## 2022-05-15 DIAGNOSIS — I120 Hypertensive chronic kidney disease with stage 5 chronic kidney disease or end stage renal disease: Secondary | ICD-10-CM | POA: Diagnosis present

## 2022-05-15 DIAGNOSIS — G4710 Hypersomnia, unspecified: Secondary | ICD-10-CM | POA: Diagnosis present

## 2022-05-15 DIAGNOSIS — F1410 Cocaine abuse, uncomplicated: Secondary | ICD-10-CM | POA: Diagnosis present

## 2022-05-15 DIAGNOSIS — E669 Obesity, unspecified: Secondary | ICD-10-CM | POA: Diagnosis present

## 2022-05-15 DIAGNOSIS — F1210 Cannabis abuse, uncomplicated: Secondary | ICD-10-CM | POA: Diagnosis present

## 2022-05-15 DIAGNOSIS — Z8249 Family history of ischemic heart disease and other diseases of the circulatory system: Secondary | ICD-10-CM | POA: Diagnosis not present

## 2022-05-15 DIAGNOSIS — M25561 Pain in right knee: Secondary | ICD-10-CM | POA: Diagnosis not present

## 2022-05-15 DIAGNOSIS — Z91158 Patient's noncompliance with renal dialysis for other reason: Secondary | ICD-10-CM

## 2022-05-15 DIAGNOSIS — R600 Localized edema: Secondary | ICD-10-CM | POA: Diagnosis present

## 2022-05-15 DIAGNOSIS — Z9151 Personal history of suicidal behavior: Secondary | ICD-10-CM

## 2022-05-15 LAB — CBC WITH DIFFERENTIAL/PLATELET
Abs Immature Granulocytes: 0.01 10*3/uL (ref 0.00–0.07)
Basophils Absolute: 0 10*3/uL (ref 0.0–0.1)
Basophils Relative: 0 %
Eosinophils Absolute: 0.2 10*3/uL (ref 0.0–0.5)
Eosinophils Relative: 4 %
HCT: 30.6 % — ABNORMAL LOW (ref 36.0–46.0)
Hemoglobin: 9.4 g/dL — ABNORMAL LOW (ref 12.0–15.0)
Immature Granulocytes: 0 %
Lymphocytes Relative: 23 %
Lymphs Abs: 1.5 10*3/uL (ref 0.7–4.0)
MCH: 29.3 pg (ref 26.0–34.0)
MCHC: 30.7 g/dL (ref 30.0–36.0)
MCV: 95.3 fL (ref 80.0–100.0)
Monocytes Absolute: 0.6 10*3/uL (ref 0.1–1.0)
Monocytes Relative: 8 %
Neutro Abs: 4.3 10*3/uL (ref 1.7–7.7)
Neutrophils Relative %: 65 %
Platelets: 198 10*3/uL (ref 150–400)
RBC: 3.21 MIL/uL — ABNORMAL LOW (ref 3.87–5.11)
RDW: 14.6 % (ref 11.5–15.5)
WBC: 6.6 10*3/uL (ref 4.0–10.5)
nRBC: 0 % (ref 0.0–0.2)

## 2022-05-15 LAB — I-STAT BETA HCG BLOOD, ED (MC, WL, AP ONLY): I-stat hCG, quantitative: <5 m[IU]/mL (ref <5)

## 2022-05-15 LAB — HEPATITIS B SURFACE ANTIGEN: Hepatitis B Surface Ag: NONREACTIVE

## 2022-05-15 LAB — COMPREHENSIVE METABOLIC PANEL
ALT: 11 U/L (ref 0–44)
AST: 16 U/L (ref 15–41)
Albumin: 3.1 g/dL — ABNORMAL LOW (ref 3.5–5.0)
Alkaline Phosphatase: 50 U/L (ref 38–126)
Anion gap: 22 — ABNORMAL HIGH (ref 5–15)
BUN: 89 mg/dL — ABNORMAL HIGH (ref 6–20)
CO2: 17 mmol/L — ABNORMAL LOW (ref 22–32)
Calcium: 8.1 mg/dL — ABNORMAL LOW (ref 8.9–10.3)
Chloride: 101 mmol/L (ref 98–111)
Creatinine, Ser: 27.6 mg/dL — ABNORMAL HIGH (ref 0.44–1.00)
GFR, Estimated: 1 mL/min — ABNORMAL LOW (ref >60)
Glucose, Bld: 87 mg/dL (ref 70–99)
Potassium: 4.5 mmol/L (ref 3.5–5.1)
Sodium: 140 mmol/L (ref 135–145)
Total Bilirubin: 1.1 mg/dL (ref 0.3–1.2)
Total Protein: 6.7 g/dL (ref 6.5–8.1)

## 2022-05-15 LAB — HEPATITIS B SURFACE ANTIBODY,QUALITATIVE: Hep B S Ab: REACTIVE — AB

## 2022-05-15 LAB — HEPATITIS B CORE ANTIBODY, TOTAL: Hep B Core Total Ab: NONREACTIVE

## 2022-05-15 LAB — RPR: RPR Ser Ql: NONREACTIVE

## 2022-05-15 LAB — HEPATITIS C ANTIBODY: HCV Ab: NONREACTIVE

## 2022-05-15 LAB — PHOSPHORUS: Phosphorus: 11.8 mg/dL — ABNORMAL HIGH (ref 2.5–4.6)

## 2022-05-15 MED ORDER — CHLORHEXIDINE GLUCONATE CLOTH 2 % EX PADS: 6.0000 | MEDICATED_PAD | Freq: Every day | CUTANEOUS | Status: DC

## 2022-05-15 MED ORDER — PANTOPRAZOLE SODIUM 40 MG PO TBEC
40.0000 mg | DELAYED_RELEASE_TABLET | Freq: Every day | ORAL | Status: DC
  Administered 2022-05-15: 40 mg via ORAL
  Filled 2022-05-15: qty 1

## 2022-05-15 MED ORDER — VANCOMYCIN HCL 2000 MG/400ML IV SOLN
2000.0000 mg | INTRAVENOUS | Status: DC
  Filled 2022-05-15: qty 400

## 2022-05-15 MED ORDER — NICOTINE 14 MG/24HR TD PT24: 14.0000 mg | MEDICATED_PATCH | Freq: Every day | TRANSDERMAL | Status: DC | PRN

## 2022-05-15 MED ORDER — HYDROXYZINE HCL 25 MG PO TABS
25.0000 mg | ORAL_TABLET | Freq: Three times a day (TID) | ORAL | Status: DC | PRN
  Administered 2022-05-15: 25 mg via ORAL
  Filled 2022-05-15: qty 1

## 2022-05-15 MED ORDER — RAMELTEON 8 MG PO TABS
8.0000 mg | ORAL_TABLET | Freq: Every day | ORAL | Status: DC
  Administered 2022-05-15: 8 mg via ORAL
  Filled 2022-05-15 (×2): qty 1

## 2022-05-15 MED ORDER — LOSARTAN POTASSIUM 50 MG PO TABS
50.0000 mg | ORAL_TABLET | Freq: Every day | ORAL | Status: DC
  Administered 2022-05-15 – 2022-05-16 (×2): 50 mg via ORAL
  Filled 2022-05-15 (×2): qty 1

## 2022-05-15 MED ORDER — RENA-VITE PO TABS
1.0000 | ORAL_TABLET | Freq: Every day | ORAL | Status: DC
  Administered 2022-05-16: 1 via ORAL
  Filled 2022-05-15 (×2): qty 1

## 2022-05-15 MED ORDER — SODIUM CHLORIDE 0.9% FLUSH
3.0000 mL | Freq: Two times a day (BID) | INTRAVENOUS | Status: DC
Start: 2022-05-15 — End: 2022-05-16
  Administered 2022-05-15 – 2022-05-16 (×2): 3 mL via INTRAVENOUS

## 2022-05-15 MED ORDER — TORSEMIDE 20 MG PO TABS
100.0000 mg | ORAL_TABLET | Freq: Every morning | ORAL | Status: DC
  Administered 2022-05-15 – 2022-05-16 (×2): 100 mg via ORAL
  Filled 2022-05-15 (×2): qty 5

## 2022-05-15 MED ORDER — ONDANSETRON HCL 4 MG/2ML IJ SOLN: 4.0000 mg | Freq: Four times a day (QID) | INTRAMUSCULAR | Status: DC | PRN

## 2022-05-15 MED ORDER — LINEZOLID 600 MG PO TABS
600.0000 mg | ORAL_TABLET | Freq: Two times a day (BID) | ORAL | Status: DC
  Administered 2022-05-15 – 2022-05-16 (×2): 600 mg via ORAL
  Filled 2022-05-15 (×4): qty 1

## 2022-05-15 MED ORDER — CALCITRIOL 0.25 MCG PO CAPS
0.2500 ug | ORAL_CAPSULE | ORAL | Status: DC
  Administered 2022-05-16: 0.25 ug via ORAL
  Filled 2022-05-15: qty 1

## 2022-05-15 MED ORDER — CALCIUM ACETATE (PHOS BINDER) 667 MG PO CAPS: 667.0000 mg | ORAL_CAPSULE | Freq: Three times a day (TID) | ORAL | Status: DC

## 2022-05-15 MED ORDER — HEPARIN SODIUM (PORCINE) 1000 UNIT/ML IJ SOLN
INTRAMUSCULAR | Status: AC
  Filled 2022-05-15: qty 3

## 2022-05-15 MED ORDER — ONDANSETRON HCL 4 MG PO TABS: 4.0000 mg | ORAL_TABLET | Freq: Four times a day (QID) | ORAL | Status: DC | PRN

## 2022-05-15 MED ORDER — ACETAMINOPHEN 325 MG PO TABS: 650.0000 mg | ORAL_TABLET | Freq: Four times a day (QID) | ORAL | Status: DC | PRN

## 2022-05-15 MED ORDER — SENNOSIDES-DOCUSATE SODIUM 8.6-50 MG PO TABS
2.0000 | ORAL_TABLET | Freq: Two times a day (BID) | ORAL | Status: DC
  Administered 2022-05-15 – 2022-05-16 (×2): 2 via ORAL
  Filled 2022-05-15 (×2): qty 2

## 2022-05-15 MED ORDER — METHOCARBAMOL 500 MG PO TABS: 1000.0000 mg | ORAL_TABLET | Freq: Every day | ORAL | Status: DC

## 2022-05-15 MED ORDER — VANCOMYCIN VARIABLE DOSE PER UNSTABLE RENAL FUNCTION (PHARMACIST DOSING): Status: DC

## 2022-05-15 MED ORDER — CLOTRIMAZOLE 1 % VA CREA
1.0000 | TOPICAL_CREAM | Freq: Every day | VAGINAL | Status: DC
Start: 2022-05-15 — End: 2022-05-16
  Administered 2022-05-15: 1 via VAGINAL
  Filled 2022-05-15: qty 45

## 2022-05-15 MED ORDER — GABAPENTIN 300 MG PO CAPS
300.0000 mg | ORAL_CAPSULE | Freq: Three times a day (TID) | ORAL | Status: DC
  Administered 2022-05-15 – 2022-05-16 (×2): 300 mg via ORAL
  Filled 2022-05-15 (×2): qty 1

## 2022-05-15 MED ORDER — ZOLPIDEM TARTRATE 5 MG PO TABS: 5.0000 mg | ORAL_TABLET | Freq: Every evening | ORAL | Status: DC | PRN

## 2022-05-15 MED ORDER — SEVELAMER CARBONATE 800 MG PO TABS: 1600.0000 mg | ORAL_TABLET | Freq: Three times a day (TID) | ORAL | Status: DC

## 2022-05-15 MED ORDER — DARBEPOETIN ALFA 150 MCG/0.3ML IJ SOSY: 150.0000 ug | PREFILLED_SYRINGE | INTRAMUSCULAR | Status: DC

## 2022-05-15 MED ORDER — ALBUTEROL SULFATE (2.5 MG/3ML) 0.083% IN NEBU: 2.5000 mg | INHALATION_SOLUTION | RESPIRATORY_TRACT | Status: DC | PRN

## 2022-05-15 MED ORDER — ARIPIPRAZOLE 5 MG PO TABS
7.5000 mg | ORAL_TABLET | Freq: Every day | ORAL | Status: DC
  Administered 2022-05-15 – 2022-05-16 (×2): 7.5 mg via ORAL
  Filled 2022-05-15 (×2): qty 2

## 2022-05-15 MED ORDER — MUPIROCIN CALCIUM 2 % EX CREA
TOPICAL_CREAM | Freq: Once | CUTANEOUS | Status: AC
  Filled 2022-05-15: qty 15

## 2022-05-15 MED ORDER — CALCIUM CARBONATE ANTACID 1250 MG/5ML PO SUSP: 500.0000 mg | Freq: Four times a day (QID) | ORAL | Status: DC | PRN

## 2022-05-15 MED ORDER — HEPARIN SODIUM (PORCINE) 1000 UNIT/ML DIALYSIS
2500.0000 [IU] | INTRAMUSCULAR | Status: DC | PRN
  Administered 2022-05-15: 2500 [IU] via INTRAVENOUS_CENTRAL
  Filled 2022-05-15: qty 3

## 2022-05-15 MED ORDER — DOCUSATE SODIUM 283 MG RE ENEM: 1.0000 | ENEMA | RECTAL | Status: DC | PRN

## 2022-05-15 MED ORDER — ACETAMINOPHEN 650 MG RE SUPP: 650.0000 mg | Freq: Four times a day (QID) | RECTAL | Status: DC | PRN

## 2022-05-15 MED ORDER — HYDRALAZINE HCL 20 MG/ML IJ SOLN
INTRAMUSCULAR | Status: AC
  Filled 2022-05-15: qty 1

## 2022-05-15 MED ORDER — NEPRO/CARBSTEADY PO LIQD: 237.0000 mL | Freq: Three times a day (TID) | ORAL | Status: DC | PRN

## 2022-05-15 MED ORDER — HEPARIN SODIUM (PORCINE) 5000 UNIT/ML IJ SOLN
5000.0000 [IU] | Freq: Three times a day (TID) | INTRAMUSCULAR | Status: DC
  Administered 2022-05-15: 5000 [IU] via SUBCUTANEOUS
  Filled 2022-05-15: qty 1

## 2022-05-15 MED ORDER — POLYETHYLENE GLYCOL 3350 17 G PO PACK
17.0000 g | PACK | Freq: Two times a day (BID) | ORAL | Status: DC
  Filled 2022-05-15: qty 1

## 2022-05-15 MED ORDER — HYDRALAZINE HCL 20 MG/ML IJ SOLN
5.0000 mg | INTRAMUSCULAR | Status: DC | PRN
  Administered 2022-05-15: 5 mg via INTRAVENOUS

## 2022-05-15 MED ORDER — SORBITOL 70 % SOLN: 30.0000 mL | Status: DC | PRN

## 2022-05-15 NOTE — Progress Notes (Signed)
Pharmacy Antibiotic Note  Natasha Long is a 26 y.o. female admitted on 05/15/2022 with  wound infection .  Pharmacy has been consulted for vancomycin dosing. Patient has ESRD on HD with outpatient schedule of TTS. Per notes, pt has missed ~2 wks of HD PTA.  Addendum  Pt only got a short session of HD before stoppage. She did not get her vanc. Likely leaving AMA. She has no peripheral access. D/w Dr Velia Meyer, we will change vanc to PO Zyvox.  Plan: Dc vanc Zyvox 600mg  PO BID  Weight: 94.1 kg (207 lb 7.3 oz)  Temp (24hrs), Avg:98.2 F (36.8 C), Min:98 F (36.7 C), Max:98.4 F (36.9 C)  Recent Labs  Lab 05/15/22 0719  WBC 6.6  CREATININE 27.60*     Estimated Creatinine Clearance: 3.7 mL/min (A) (by C-G formula based on SCr of 27.6 mg/dL (H)).    Allergies  Allergen Reactions   Prozac [Fluoxetine Hcl] Other (See Comments)    Panic attack    Wellbutrin [Bupropion] Other (See Comments)    Panic attack   Prednisone Other (See Comments)    Pt states that this med caused pancreatitis.     Antimicrobials this admission: 6/16 zyvox>>  Dose adjustments this admission: N/A  Microbiology results: None at this time   Onnie Boer, PharmD, BCIDP, AAHIVP, CPP Infectious Disease Pharmacist 05/15/2022 7:23 PM

## 2022-05-15 NOTE — Progress Notes (Signed)
Pharmacy Antibiotic Note  Natasha Long is a 26 y.o. female admitted on 05/15/2022 with  wound infection .  Pharmacy has been consulted for vancomycin dosing. Patient has ESRD on HD with outpatient schedule of TTS. Per notes, pt has missed ~2 wks of HD PTA.  Plan: Vancomycin 2000mg  IV x 1 with HD on 6/16 Plan for Vancomycin 1000mg  IV qHD (not ordered) Vancomycin ordered as variable dosing until HD plan determined.  F/u HD plan per nephrology Monitor WBC, temp, and clinical s/sx of infection daily Check vancomycin levels at steady state  Weight: 95.8 kg (211 lb 3.2 oz)  Temp (24hrs), Avg:98.1 F (36.7 C), Min:98 F (36.7 C), Max:98.2 F (36.8 C)  Recent Labs  Lab 05/15/22 0719  WBC 6.6  CREATININE 27.60*    Estimated Creatinine Clearance: 3.7 mL/min (A) (by C-G formula based on SCr of 27.6 mg/dL (H)).    Allergies  Allergen Reactions   Prozac [Fluoxetine Hcl] Other (See Comments)    Panic attack    Wellbutrin [Bupropion] Other (See Comments)    Panic attack   Prednisone Other (See Comments)    Pt states that this med caused pancreatitis.     Antimicrobials this admission: Vancomycin 6/16 >>   Dose adjustments this admission: N/A  Microbiology results: None at this time   Thank you for allowing pharmacy to be a part of this patient's care.  Kaleen Mask 05/15/2022 1:08 PM

## 2022-05-15 NOTE — Progress Notes (Signed)
Pt was complaining of being hot and was crying. Stated she no longer wanted to be on dialysis and that the feeling of being hot will go away after being off the HD machine. Signed AMA and treatment was stopped. PA- Curt Bears is aware and was there at bedside. 1.7L removed and had 1hr remaining.

## 2022-05-15 NOTE — ED Notes (Signed)
Went in pt room to assess and she had turned off her bedside monitor and was asking for a sandwich. Patient instructed not to turn off monitor as it is important for her care. Pt provided a 2nd sandwich and this paramedic hooked her back up to her monitor. Patients vitals are within normal range excluding the hypertension documented in the flowsheets.

## 2022-05-15 NOTE — ED Triage Notes (Signed)
Patient complains of lip swelling and MRSA returning.  Also complains of hard knot in left calf and pain to right knee and reports she hasnt had dialysis in two weeks because she keeps sleeping in and unable to wake up to go to dialysis.

## 2022-05-15 NOTE — Progress Notes (Signed)
Pt arrived to 5M11 via stretcher from dialysis. Pt refused VS at this time d/t the "room smells bad". Pt is verbally aggressive because the "room is cold". Patient refusing telemetry at this time. EVS re-cleaned room. Temperature in room adjusted and blankets provided to pt. Karmen Bongo, MD notified. Security at bedside.

## 2022-05-15 NOTE — ED Provider Notes (Signed)
[place] EMERGENCY DEPARTMENT Provider Note   CSN: [id number] Arrival date & time: 05/15/22  0703     History  Chief Complaint  Patient presents with   Oral Swelling   Needs Dialysis    Jamelle Glotfelty is a 26 y.o. female.  Pt is a 25y/o female with hx of borderline PD, previous suicide attempts, ESRD on HD, polysubstance abuse, HTN, septic arthritis, hx syphillis/gonorrhea, med and HD noncompliance who was recently admitted and discharged on 05/07/2022 and presents today reporting that she has not had dialysis since leaving the hospital continues to have vaginal itching burning, persistent pain in her left lower leg and issues with lesions on her lips.  She does still make urine and reports some mild shortness of breath but denies any chest pain or abdominal pain.  She has had no nausea or vomiting.  She thinks she may be running fever but she is not sure.  She was last sexually active approximately 3 days ago and reports she has had a new partner in the last week and has not been using protection.  In this month she has had more than 3 sexual partners and intermittently uses protection.  Based on her last hospitalization she did test negative for HIV this month.  Patient was last tested for syphilis in March and at that time was negative.  She reports she is missing dialysis because she is just sleeping all the time and is not waking up to go.  She reports she just feels unwell today.  The history is provided by the patient.       Home Medications Prior to Admission medications   Medication Sig Start Date End Date Taking? Authorizing Provider  acetaminophen (TYLENOL) 500 MG tablet Take 2 tablets (1,000 mg total) by mouth every 8 (eight) hours. 02/16/22   [name], MD  acyclovir ointment (ZOVIRAX) 5 % Apply topically 4 (four) times daily. 05/07/22   Dwyane Dee, MD  ARIPiprazole (ABILIFY) 15 MG tablet Take 0.5 tablets (7.5 mg total) by mouth daily. 12/27/21    Regalado, Belkys A, MD  B Complex-C-Folic Acid (RENAL VITAMIN) 0.8 MG TABS Take 1 tablet by mouth daily. 10/29/21   [provider]  Blood Pressure Monitoring (BLOOD PRESSURE MONITOR 7) DEVI 1 Units by Does not apply route daily. Measure blood pressure daily 02/29/20   [name], MD  calcitRIOL (ROCALTROL) 0.25 MCG capsule Take 0.25 mcg by mouth Every Tuesday,Thursday,and Saturday with dialysis. 09/25/20   [provider]  calcium acetate (PHOSLO) 667 MG capsule Take 667 mg by mouth 3 (three) times daily. 07/03/21   [provider]  Darbepoetin Alfa (ARANESP) 150 MCG/0.3ML SOSY injection Inject 0.3 mLs (150 mcg total) into the vein every Saturday with hemodialysis. 02/21/22   [name], MD  diclofenac Sodium (VOLTAREN) 1 % GEL Apply 4 g topically 4 (four) times daily as needed (Pain to L wrist and R knee). 02/16/22   [name], MD  diphenhydrAMINE-zinc acetate (BENADRYL) cream Apply 1 application. topically 2 (two) times daily as needed for itching. 02/16/22   [name], MD  gabapentin (NEURONTIN) 600 MG tablet Take 300 mg by mouth 3 (three) times daily. 03/31/22   [provider]  hydrocerin (EUCERIN) CREA Apply 1 application. topically 2 (two) times daily. 02/16/22   [name], MD  HYDROcodone-acetaminophen (NORCO/VICODIN) 5-325 MG tablet Take 1 tablet by mouth every 6 (six) hours as needed. 02/23/22   [provider]  hydrOXYzine (VISTARIL) 25 MG  capsule Take 1 capsule (25 mg total) by mouth every 8 (eight) hours as needed for anxiety. 03/25/22   [name], MD  lidocaine-prilocaine (EMLA) cream Apply 1 application topically daily as needed (port site access on dialysis days (Tuesday's, Thursday's, and Saturday's). 03/06/21   [provider]  losartan (COZAAR) 50 MG tablet Take 50 mg by mouth daily. 12/11/21   [provider]  methocarbamol (ROBAXIN) 500 MG tablet Take 2 tablets (1,000 mg total) by mouth at  bedtime. 02/16/22   [name], MD  omeprazole (PRILOSEC) 20 MG capsule TAKE 1 CAPSULE BY MOUTH EVERY DAY Patient taking differently: Take 20 mg by mouth daily. 04/01/22   [name], MD  polyethylene glycol (MIRALAX / GLYCOLAX) 17 g packet Take 17 g by mouth 2 (two) times daily. 02/16/22   [name], MD  ramelteon (ROZEREM) 8 MG tablet Take 1 tablet (8 mg total) by mouth at bedtime. 02/16/22   [name], MD  senna-docusate (SENOKOT-S) 8.6-50 MG tablet Take 2 tablets by mouth 2 (two) times daily. 02/16/22   [name], MD  sevelamer carbonate (RENVELA) 800 MG tablet Take 2 tablets (1,600 mg total) by mouth 3 (three) times daily with meals. 02/16/22   [name], MD  torsemide (DEMADEX) 100 MG tablet Take 100 mg by mouth every morning. 03/31/22   [provider]      Allergies    Prozac [fluoxetine hcl], Wellbutrin [bupropion], and Prednisone    Review of Systems   Review of Systems  Physical Exam Updated Vital Signs BP (!) 178/86   Pulse 92   Temp 98.2 F (36.8 C) (Oral)   Resp 18   SpO2 100%  Physical Exam Vitals and nursing note reviewed. Exam conducted with a chaperone present.  Constitutional:      General: She is not in acute distress.    Appearance: She is well-developed.  HENT:     Head: Normocephalic and atraumatic.      Mouth/Throat:     Comments: Dentition appears appropriate.  No lesions on the gums or tongue Eyes:     Conjunctiva/sclera: Conjunctivae normal.     Pupils: Pupils are equal, round, and reactive to light.  Cardiovascular:     Rate and Rhythm: Normal rate and regular rhythm.     Heart sounds: Murmur heard.     Comments: Holosystolic murmur Pulmonary:     Effort: Pulmonary effort is normal. No respiratory distress.     Breath sounds: Normal breath sounds. No wheezing or rales.  Abdominal:     General: There is no distension.     Palpations: Abdomen is soft.     Tenderness: There is no abdominal tenderness. There is  no guarding or rebound.  Genitourinary:    Cervix: Cervical bleeding present. No cervical motion tenderness.     Uterus: Not enlarged and not tender.      Adnexa: Right adnexa normal and left adnexa normal.       Comments: Small abrasions and mildly tender rash present on the area as indicated.  No vesicular lesions noted.  Does not seem to have significant pain on bimanual exam only around the labia minora Musculoskeletal:        General: Tenderness present. Normal range of motion.     Cervical back: Normal range of motion and neck supple.       Legs:     Comments: No joint swelling or pain  Skin:    General: Skin is warm and  dry.     Findings: No erythema or rash.  Neurological:     Mental Status: She is alert and oriented to person, place, and time. Mental status is at baseline.  Psychiatric:        Mood and Affect: Mood normal.        Behavior: Behavior normal.     ED Results / Procedures / Treatments   Labs (all labs ordered are listed, but only abnormal results are displayed) Labs Reviewed  CBC WITH DIFFERENTIAL/PLATELET - Abnormal; Notable for the following components:      Result Value   RBC 3.21 (*)    Hemoglobin 9.4 (*)    HCT 30.6 (*)    All other components within normal limits  COMPREHENSIVE METABOLIC PANEL - Abnormal; Notable for the following components:   CO2 17 (*)    BUN 89 (*)    Creatinine, Ser 27.60 (*)    Calcium 8.1 (*)    Albumin 3.1 (*)    GFR, Estimated 1 (*)    Anion gap 22 (*)    All other components within normal limits  WET PREP, GENITAL  RPR  I-STAT BETA HCG BLOOD, ED ([facility], [facility], [facility] ONLY)  GC/CHLAMYDIA PROBE AMP ([place]) NOT AT [facility]    EKG None ED ECG REPORT   Date: 05/15/2022  Rate: 86  Rhythm: normal sinus rhythm  QRS Axis: normal  Intervals: normal  ST/T Wave abnormalities: nonspecific T wave changes  Conduction Disutrbances:none  Narrative Interpretation:   Old EKG Reviewed: unchanged  I have personally reviewed the  EKG tracing and agree with the computerized printout as noted.   Radiology DG Chest 2 View  Result Date: 05/15/2022 CLINICAL DATA:  Shortness of breath, cough and congestion EXAM: CHEST - 2 VIEW COMPARISON:  None Available. FINDINGS: Marked cardiac silhouette enlargement, similar to the prior study. Difficult to exclude pericardial effusion. Similar vascular congestion and basilar atelectasis. No large effusion, significant CHF pattern or definite focal pneumonia. No pneumothorax. Trachea midline. IMPRESSION: Marked cardiomegaly with vascular congestion and basilar atelectasis. See above comment. No significant interval change. Electronically Signed   By: Jerilynn Mages.  Shick M.D.   On: 05/15/2022 07:50    Procedures Procedures    Medications Ordered in ED Medications  mupirocin cream (BACTROBAN) 2 % ( Topical Given 05/15/22 0959)    ED Course/ Medical Decision Making/ A&P                           Medical Decision Making Amount and/or Complexity of Data Reviewed External Data Reviewed: notes.    Details: recent hospital d/c Labs: ordered. Decision-making details documented in ED Course. Radiology: ordered and independent interpretation performed. Decision-making details documented in ED Course. ECG/medicine tests: ordered and independent interpretation performed. Decision-making details documented in ED Course.  Risk Prescription drug management. Decision regarding hospitalization.   Pt with multiple medical problems and comorbidities and presenting today with a complaint that caries a high risk for morbidity and mortality.  Presenting today with multiple complaints.  Initial complaint is she has not had dialysis since leaving the hospital which would have been approximately 9 days ago.  Patient does not have obvious signs of fluid overload today and breath sounds are clear.  She does have a holosystolic murmur and known pericardial effusion based on last EF with minimally reduced EF.  Patient  is noted to be hypertensive here but is not taking medications regularly.  Secondly patient is complaining  of a tender area on her right lower extremity which appears to be in the soft tissue.  Does not appear to be an abscess on clear if this is a cyst or hematoma.  Mild discoloring noted.  We will do DVT study to ensure no other acute pathology.  Patient has had a prior history of syphilis and disseminated gonorrhea but at this time has no joint swelling or erythema.  During patient's last hospitalization she was not compliant with getting GC chlamydia urine testing or pelvic exam but she is amenable to that today and is at risk as she has had more than 3 sexual partners in the last month and does not use protection regularly.  She did have a negative HIV test this month but has not been tested for syphilis in multiple months.  We will do a T. pallidum, GC chlamydia and pelvic exam.  I independently interpreted patient's EKG and labs today.  EKG without acute findings and unchanged from prior.  CBC with stable hemoglobin of 9.4 from 10 when she left the hospital and normal white count, CMP with BUN of 89 and normal potassium of 4.5.  I have independently visualized and interpreted pt's images today. Chest x-ray today shows continued cardiomegaly with some mild vascular congestion slightly improved from prior chest x-ray. 10:20 AM Consulted nephrology and spoke with Dr. Jonnie Finner who recommended that patient needs admission as she will need 2 or 3 dialysis treatments due to her fluid overload and uremia.  Will admit to the hospitalist service.        Final Clinical Impression(s) / ED Diagnoses Final diagnoses:  Uremia  ESRD on hemodialysis [facility])    Rx / DC Orders ED Discharge Orders     None         Blanchie Dessert, MD 05/15/22 1020

## 2022-05-15 NOTE — Plan of Care (Signed)
  Problem: Pain Managment: Goal: General experience of comfort will improve Outcome: Progressing   Problem: Safety: Goal: Ability to remain free from injury will improve Outcome: Progressing   Problem: Skin Integrity: Goal: Risk for impaired skin integrity will decrease Outcome: Progressing   

## 2022-05-15 NOTE — Progress Notes (Signed)
Lower extremity venous right study completed.   Please see CV Proc for preliminary results.   Naevia Unterreiner, RDMS, RVT  

## 2022-05-15 NOTE — H&P (Signed)
History and Physical    Patient: Natasha Long XAJ:[id number] DOB: 1996-05-05 DOA: 05/15/2022 DOS: the patient was seen and examined on 05/15/2022 PCP: [name], MD  Patient coming from: Home - lives alone; NOK: No one   Chief Complaint: lip swelling  HPI: Natasha Long is a 26 y.o. female with medical history significant of borderline personality d/o with suicide attempts; ESRD on HD; polysubstance abuse; HTN; septic arthritis; STIs; and medication noncompliance presenting with lip swelling, missed HD.   She was last admitted from 6/5-8 with AMS associated with uremia, intermittently refusing HD.   She reports that she presented for lower lip edema as well as L medial lower leg pain.  She has not been to HD since last admission because she was too sleepy.  No SOB.    ER Course:  Does not go to HD.  Oversleeping due to uremia.  No HD since last admission.  >3 partners in a month.  GC/Chl, RPR pending.  HIV negative last week.  Leg US done at bedside - hard, tender area on medial L lower leg, [place] pending.  Nephrology will dialyze, will need multiple sessions.       Review of Systems: As mentioned in the history of present illness. All other systems reviewed and are negative. Past Medical History:  Diagnosis Date   Asthma    as a child, no problem as an adult, no inhaler   Complication of anesthesia    woke up before tube removed, 1 time fought nurses   ESRD on hemodialysis [facility])    M-W-F   History of borderline personality disorder    Hypertension    diagnosed as child; stopped meds at 18 yo   Insomnia    Neuromuscular disorder ([place])    peripheral neuropathy   PTSD (post-traumatic stress disorder)    Past Surgical History:  Procedure Laterality Date   AV FISTULA PLACEMENT Left 10/18/2020   Procedure: LEFT ARM ARTERIOVENOUS (AV) FISTULA CREATION;  Surgeon: Serafina Mitchell, MD;  Location: [facility] OR;  Service: Vascular;  Laterality: Left;   CHOLECYSTECTOMY      extraction of wisdom teeth     FISTULA SUPERFICIALIZATION Left 02/13/2021   Procedure: LEFT BRACHIOCEPHALIC ARTERIOVENOUS FISTULA SUPERFICIALIZATION;  Surgeon: Serafina Mitchell, MD;  Location: [facility];  Service: Vascular;  Laterality: Left;   I & D EXTREMITY Left 01/30/2022   Procedure: IRRIGATION AND DEBRIDEMENT EXTREMITY;  Surgeon: Milly Jakob, MD;  Location: [facility];  Service: Orthopedics;  Laterality: Left;   INCISION AND DRAINAGE OF WOUND Left 01/30/2022   Procedure: LEFT WRIST ASPIRATION;  Surgeon: Vanetta Mulders, MD;  Location: [facility];  Service: Orthopedics;  Laterality: Left;   KNEE ARTHROSCOPY Right 01/30/2022   Procedure: ARTHROSCOPY KNEE AND IRRIIGATION AND DEBRIDMENT; LEFT WRIST ASPIRATION;  Surgeon: Vanetta Mulders, MD;  Location: [facility];  Service: Orthopedics;  Laterality: Right;   RENAL BIOPSY     x 2   TUNNELED VENOUS CATHETER PLACEMENT  02/11/2021   [facility]   Social History:  reports that she has been smoking cigarettes. She has a 5.00 pack-year smoking history. She has never used smokeless tobacco. She reports current alcohol use. She reports current drug use. Drugs: Marijuana and Cocaine.  Allergies  Allergen Reactions   Prozac [Fluoxetine Hcl] Other (See Comments)    Panic attack    Wellbutrin [Bupropion] Other (See Comments)    Panic attack   Prednisone Other (See Comments)    Pt states that this med caused  pancreatitis.     Family History  Adopted: Yes  Problem Relation Age of Onset   Diabetes Other    Hypertension Other     Prior to Admission medications   Medication Sig Start Date End Date Taking? Authorizing Provider  acetaminophen (TYLENOL) 500 MG tablet Take 2 tablets (1,000 mg total) by mouth every 8 (eight) hours. 02/16/22   [name], MD  acyclovir ointment (ZOVIRAX) 5 % Apply topically 4 (four) times daily. 05/07/22   Dwyane Dee, MD  ARIPiprazole (ABILIFY) 15 MG tablet Take 0.5 tablets (7.5 mg total) by mouth daily. 12/27/21   Regalado,  Belkys A, MD  B Complex-C-Folic Acid (RENAL VITAMIN) 0.8 MG TABS Take 1 tablet by mouth daily. 10/29/21   [provider]  Blood Pressure Monitoring (BLOOD PRESSURE MONITOR 7) DEVI 1 Units by Does not apply route daily. Measure blood pressure daily 02/29/20   [name], MD  calcitRIOL (ROCALTROL) 0.25 MCG capsule Take 0.25 mcg by mouth Every Tuesday,Thursday,and Saturday with dialysis. 09/25/20   [provider]  calcium acetate (PHOSLO) 667 MG capsule Take 667 mg by mouth 3 (three) times daily. 07/03/21   [provider]  Darbepoetin Alfa (ARANESP) 150 MCG/0.3ML SOSY injection Inject 0.3 mLs (150 mcg total) into the vein every Saturday with hemodialysis. 02/21/22   [name], MD  diclofenac Sodium (VOLTAREN) 1 % GEL Apply 4 g topically 4 (four) times daily as needed (Pain to L wrist and R knee). 02/16/22   [name], MD  diphenhydrAMINE-zinc acetate (BENADRYL) cream Apply 1 application. topically 2 (two) times daily as needed for itching. 02/16/22   [name], MD  gabapentin (NEURONTIN) 600 MG tablet Take 300 mg by mouth 3 (three) times daily. 03/31/22   [provider]  hydrocerin (EUCERIN) CREA Apply 1 application. topically 2 (two) times daily. 02/16/22   [name], MD  HYDROcodone-acetaminophen (NORCO/VICODIN) 5-325 MG tablet Take 1 tablet by mouth every 6 (six) hours as needed. 02/23/22   [provider]  hydrOXYzine (VISTARIL) 25 MG capsule Take 1 capsule (25 mg total) by mouth every 8 (eight) hours as needed for anxiety. 03/25/22   [name], MD  lidocaine-prilocaine (EMLA) cream Apply 1 application topically daily as needed (port site access on dialysis days (Tuesday's, Thursday's, and Saturday's). 03/06/21   [provider]  losartan (COZAAR) 50 MG tablet Take 50 mg by mouth daily. 12/11/21   [provider]  methocarbamol (ROBAXIN) 500 MG tablet Take 2 tablets (1,000 mg total) by mouth at bedtime. 02/16/22    [name], MD  omeprazole (PRILOSEC) 20 MG capsule TAKE 1 CAPSULE BY MOUTH EVERY DAY Patient taking differently: Take 20 mg by mouth daily. 04/01/22   [name], MD  polyethylene glycol (MIRALAX / GLYCOLAX) 17 g packet Take 17 g by mouth 2 (two) times daily. 02/16/22   [name], MD  ramelteon (ROZEREM) 8 MG tablet Take 1 tablet (8 mg total) by mouth at bedtime. 02/16/22   [name], MD  senna-docusate (SENOKOT-S) 8.6-50 MG tablet Take 2 tablets by mouth 2 (two) times daily. 02/16/22   [name], MD  sevelamer carbonate (RENVELA) 800 MG tablet Take 2 tablets (1,600 mg total) by mouth 3 (three) times daily with meals. 02/16/22   [name], MD  torsemide (DEMADEX) 100 MG tablet Take 100 mg by mouth every morning. 03/31/22   [provider]    Physical Exam: Vitals:   05/15/22 0845 05/15/22 0900 05/15/22 1204 05/15/22 1209  BP: (!) 192/108 (!) 178/86 (!) 158/78   Pulse:  92 90   Resp:  18 15   Temp:   98 F (36.7 C)   TempSrc:   Oral   SpO2:  100% 100%   Weight:    95.8 kg   General:  Appears calm and comfortable and is in NAD, poor eye contact Eyes:   EOMI, normal lids, iris ENT:  grossly normal hearing, lips & tongue, mmm Neck:  no LAD, masses or thyromegaly Cardiovascular:  RRR, no m/r/g. No LE edema.  Respiratory:   CTA bilaterally with no wheezes/rales/rhonchi.  Normal respiratory effort. Abdomen:  soft, NT, ND Skin:  no rash or induration seen on limited exam Musculoskeletal:  grossly normal tone BUE/BLE, good ROM, no bony abnormality Psychiatric:  blunted mood and affect, speech fluent and appropriate, AOx3 Neurologic:  CN 2-12 grossly intact, moves all extremities in coordinated fashion   Radiological Exams on Admission: Independently reviewed - see discussion in A/P where applicable  VAS [place] LOWER EXTREMITY VENOUS (DVT) (7a-7p)  Result Date: 05/15/2022  Lower Venous DVT Study Patient Name:  GIGI ONSTAD  Date of Exam:    05/15/2022 Medical Rec #: [id number]          Accession #:    [id number] Date of Birth: May 09, 1996          Patient Gender: F Patient Age:   47 years Exam Location:  [facility] Procedure:      VAS [place] LOWER EXTREMITY VENOUS (DVT) Referring Phys: Loree Fee PLUNKETT --------------------------------------------------------------------------------  Indications: Right knee pain.  Comparison Study: No prior studies. Performing Technologist: Darlin Coco RDMS, RVT  Examination Guidelines: A complete evaluation includes B-mode imaging, spectral Doppler, color Doppler, and power Doppler as needed of all accessible portions of each vessel. Bilateral testing is considered an integral part of a complete examination. Limited examinations for reoccurring indications may be performed as noted. The reflux portion of the exam is performed with the patient in reverse Trendelenburg.  +---------+---------------+---------+-----------+----------+--------------+ RIGHT    CompressibilityPhasicitySpontaneityPropertiesThrombus Aging +---------+---------------+---------+-----------+----------+--------------+ CFV      Full           Yes      Yes                                 +---------+---------------+---------+-----------+----------+--------------+ SFJ      Full                                                        +---------+---------------+---------+-----------+----------+--------------+ FV Prox  Full                                                        +---------+---------------+---------+-----------+----------+--------------+ FV Mid   Full                                                        +---------+---------------+---------+-----------+----------+--------------+ FV DistalFull                                                        +---------+---------------+---------+-----------+----------+--------------+  PFV      Full                                                         +---------+---------------+---------+-----------+----------+--------------+ POP      Full           Yes      Yes                                 +---------+---------------+---------+-----------+----------+--------------+ PTV      Full                                                        +---------+---------------+---------+-----------+----------+--------------+ PERO     Full                                                        +---------+---------------+---------+-----------+----------+--------------+ Gastroc  Full                                                        +---------+---------------+---------+-----------+----------+--------------+     Summary: RIGHT: - There is no evidence of deep vein thrombosis in the lower extremity.  - No cystic structure found in the popliteal fossa.   *See table(s) above for measurements and observations.    Preliminary    DG Chest 2 View  Result Date: 05/15/2022 CLINICAL DATA:  Shortness of breath, cough and congestion EXAM: CHEST - 2 VIEW COMPARISON:  None Available. FINDINGS: Marked cardiac silhouette enlargement, similar to the prior study. Difficult to exclude pericardial effusion. Similar vascular congestion and basilar atelectasis. No large effusion, significant CHF pattern or definite focal pneumonia. No pneumothorax. Trachea midline. IMPRESSION: Marked cardiomegaly with vascular congestion and basilar atelectasis. See above comment. No significant interval change. Electronically Signed   By: Jerilynn Mages.  Shick M.D.   On: 05/15/2022 07:50    EKG: Independently reviewed.  NSR with rate 86; no evidence of acute ischemia   Labs on Admission: I have personally reviewed the available labs and imaging studies at the time of the admission.  Pertinent labs:    CO2 17 BUN 89//Creatinine 27.60/GFR 1 Anion gap 22 Phos 11.8 Albumin 3.1 WBC 6.6 Hgb 9.4 GC/Chl negative HIV negative on 6/6 RPR pending UDS on 6/4 + cocaine, THC MRSA on lip on  6/4, sensitive to Cipro, Clinda, Gent, Rifampin, Tetracycline, Bactrim, and Vanc   Assessment and Plan: Principal Problem:   Uremia of renal origin Active Problems:   ESRD on dialysis ([place])   Lip lesion   Pericardial effusion without cardiac tamponade   Chronic hypertension   Borderline personality disorder ([facility])   Lower leg pain   High risk sexual behavior   Polysubstance abuse ([place])    ESRD with uremia needing HD -Patient on chronic  MWF HD, has not been since last d/c on 6/8 -Nephrology prn order set utilized -Hypersomnolence may be associated with uremia -She appears likely to benefit from serial HD -Will admit to Med surg -Nephrology is consulting and has ordered HD -Continue Renavite, calcitriol, Phoslo, Renvela   HTN -Continue Coreg, Imdur -HTN control is likely to improve post-HD  Left medial lower leg pain -It is quite firm on evaluation in that region -[place] was ordered, although CT may be more helpful    High risk sexual behavior -Per Dr. Maryan Rued, patient has had multiple new sexual partners in the last month -GC/Chl negative -RPR pending -HIV negative on last admission -Needs counseling when receptive   Lip lesion -Impressive edema of lower lip -Recent culture with MRSA -Will order Vanc with HD (discussed with Dr. Maryan Rued)   Pericardial effusion without cardiac tamponade - Echocardiogram showed moderate pericardial effusion without signs of tamponade on prior admission - likely related to volume overload in the setting of HD non-compliance   - EF 45-50% at that time - Cardiology consulted last admission - no further workup needed, ongoing dialysis recommended  -Continue torsemide   Polysubstance abuse -Prior UDS + for cocaine and THC -Cessation encouraged   Borderline personality disorder [facility]) -Psychiatry followed during prior hospitalization - intermittently refusing care -No change in management was recommended -Now back with persistent  non-compliance; will request re-consult from psychiatry -For now, will continue Abilify, hydroxyzine, ramelteon   Chronic hypertension - Continue losartan     Advance Care Planning:   Code Status: Full Code   Consults: Nephrology; psychiatry; [facility] team; wound care  DVT Prophylaxis: Heparin  Family Communication: None present; she declines to have me contact family at this time  Severity of Illness: The appropriate patient status for this patient is INPATIENT. Inpatient status is judged to be reasonable and necessary in order to provide the required intensity of service to ensure the patient's safety. The patient's presenting symptoms, physical exam findings, and initial radiographic and laboratory data in the context of their chronic comorbidities is felt to place them at high risk for further clinical deterioration. Furthermore, it is not anticipated that the patient will be medically stable for discharge from the hospital within 2 midnights of admission.   * I certify that at the point of admission it is my clinical judgment that the patient will require inpatient hospital care spanning beyond 2 midnights from the point of admission due to high intensity of service, high risk for further deterioration and high frequency of surveillance required.*  Author: Karmen Bongo, MD 05/15/2022 12:28 PM  For on call review [email or web address].

## 2022-05-15 NOTE — Progress Notes (Signed)
Contacted by ED provider that pt currently in the ED. Nephrologist to review pt's case. Pt receives out-pt HD at [facility] on TTS. Pt arrives at 11:50 for 12:10 chair time. Contacted [facility] and spoke to charge RN, [employer]. Clinic is unable to provide treatment today but can provide normal treatment tomorrow. Update provided to ED provider and nephrologist.   Melven Sartorius Renal Navigator [phone]

## 2022-05-15 NOTE — Progress Notes (Signed)
Called over by dialysis RN to evaluate patient. She was crying that her face was burning and was insistent to sign off HD early. Truly, she was sweating. BP was high (not low), BS checked and = 90. She was afebrile. Tells me that it happens some times and is alleviated when she comes off HD. I turned the temp on her HD machine down from 37C -> 35C and we gave her a cold compress to her face. Attempted to assuage her to stay on machine a little longer, but she was firm in her decision, so HD was ended early.  [name], PA-C [employer] Pager [phone]

## 2022-05-15 NOTE — Procedures (Signed)
   I was present at this dialysis session, have reviewed the session itself and made  appropriate changes Kelly Splinter MD [place] pager [phone]   05/15/2022, 3:18 PM

## 2022-05-15 NOTE — Consult Note (Signed)
[facility] Nurse Consult Note: I went to [facility] ED 21 to see the patient, but she had been taken to HD. Reason for Consult: lower lip edema I was unable to see the patient. I sent Dr. Lorin Mercy a Secure Chat message informing her of this, and that the only thing I could think of for a swollen lip is to place I on it.  I have entered a nursing care instruction for the patient to have ice placed to the lip every 4 hours.  [place] nurse will not follow at this time.  Please re-consult the [facility] team if needed.  Val Riles, RN, MSN, CWOCN, CNS-BC, pager [phone]
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
## 2022-05-16 ENCOUNTER — Encounter (HOSPITAL_COMMUNITY): Payer: Medicaid Other

## 2022-05-16 DIAGNOSIS — N19 Unspecified kidney failure: Secondary | ICD-10-CM | POA: Diagnosis not present

## 2022-05-16 LAB — RENAL FUNCTION PANEL
Albumin: 2.8 g/dL — ABNORMAL LOW (ref 3.5–5.0)
Anion gap: 18 — ABNORMAL HIGH (ref 5–15)
BUN: 55 mg/dL — ABNORMAL HIGH (ref 6–20)
CO2: 22 mmol/L (ref 22–32)
Calcium: 8.5 mg/dL — ABNORMAL LOW (ref 8.9–10.3)
Chloride: 99 mmol/L (ref 98–111)
Creatinine, Ser: 19.03 mg/dL — ABNORMAL HIGH (ref 0.44–1.00)
GFR, Estimated: 2 mL/min — ABNORMAL LOW (ref >60)
Glucose, Bld: 101 mg/dL — ABNORMAL HIGH (ref 70–99)
Phosphorus: 8.5 mg/dL — ABNORMAL HIGH (ref 2.5–4.6)
Potassium: 3.5 mmol/L (ref 3.5–5.1)
Sodium: 139 mmol/L (ref 135–145)

## 2022-05-16 LAB — CBC
HCT: 30.4 % — ABNORMAL LOW (ref 36.0–46.0)
Hemoglobin: 9.5 g/dL — ABNORMAL LOW (ref 12.0–15.0)
MCH: 29.1 pg (ref 26.0–34.0)
MCHC: 31.3 g/dL (ref 30.0–36.0)
MCV: 93 fL (ref 80.0–100.0)
Platelets: 198 10*3/uL (ref 150–400)
RBC: 3.27 MIL/uL — ABNORMAL LOW (ref 3.87–5.11)
RDW: 14.3 % (ref 11.5–15.5)
WBC: 4.5 10*3/uL (ref 4.0–10.5)
nRBC: 0 % (ref 0.0–0.2)

## 2022-05-16 LAB — BASIC METABOLIC PANEL
Anion gap: 20 — ABNORMAL HIGH (ref 5–15)
BUN: 56 mg/dL — ABNORMAL HIGH (ref 6–20)
CO2: 22 mmol/L (ref 22–32)
Calcium: 8.7 mg/dL — ABNORMAL LOW (ref 8.9–10.3)
Chloride: 98 mmol/L (ref 98–111)
Creatinine, Ser: 19.41 mg/dL — ABNORMAL HIGH (ref 0.44–1.00)
GFR, Estimated: 2 mL/min — ABNORMAL LOW (ref >60)
Glucose, Bld: 102 mg/dL — ABNORMAL HIGH (ref 70–99)
Potassium: 3.5 mmol/L (ref 3.5–5.1)
Sodium: 140 mmol/L (ref 135–145)

## 2022-05-16 LAB — HEPATITIS B SURFACE ANTIBODY, QUANTITATIVE: Hep B S AB Quant (Post): >1000 m[IU]/mL (ref >9.9)

## 2022-05-16 MED ORDER — HEPARIN SODIUM (PORCINE) 1000 UNIT/ML DIALYSIS: 2500.0000 [IU] | INTRAMUSCULAR | Status: DC | PRN

## 2022-05-16 MED ORDER — CALCIUM ACETATE (PHOS BINDER) 667 MG PO CAPS
2001.0000 mg | ORAL_CAPSULE | Freq: Three times a day (TID) | ORAL | Status: DC
  Administered 2022-05-16: 2001 mg via ORAL
  Filled 2022-05-16: qty 3

## 2022-05-16 NOTE — Progress Notes (Signed)
[facility] Progress Note   Subjective:  Seen on HD today, signed off early yesterday. No CP/dyspnea this AM. Lips are crusted. IV Vancomycin changed to PO Linezolid yesterday.  Objective Vitals:   05/16/22 0815 05/16/22 0825 05/16/22 0900 05/16/22 0930  BP:  (!) 171/96 (!) 163/103 (!) 177/103  Pulse:  86 79   Resp:  17 20   Temp:  98.4 F (36.9 C)    TempSrc:  Oral    SpO2:  94% 95%   Weight: 93.4 kg      Physical Exam General: Chronically ill appearing woman, NAD. Room air. Lips crusted. Heart:  RRR; no murmur Lungs: CTA anteriorly Abdomen: soft Extremities: Trace BLE edema Dialysis Access: L AVF + bruit  Additional Objective Labs: Basic Metabolic Panel: Recent Labs  Lab 05/15/22 0719 05/16/22 0652 05/16/22 0704  NA 140 139 140  K 4.5 3.5 3.5  CL 101 99 98  CO2 17* 22 22  GLUCOSE 87 101* 102*  BUN 89* 55* 56*  CREATININE 27.60* 19.03* 19.41*  CALCIUM 8.1* 8.5* 8.7*  PHOS 11.8* 8.5*  --    Liver Function Tests: Recent Labs  Lab 05/15/22 0719 05/16/22 0652  AST 16  --   ALT 11  --   ALKPHOS 50  --   BILITOT 1.1  --   PROT 6.7  --   ALBUMIN 3.1* 2.8*   CBC: Recent Labs  Lab 05/15/22 0719 05/16/22 0704  WBC 6.6 4.5  NEUTROABS 4.3  --   HGB 9.4* 9.5*  HCT 30.6* 30.4*  MCV 95.3 93.0  PLT 198 198   Studies/Results: VAS [place] LOWER EXTREMITY VENOUS (DVT) (7a-7p)  Result Date: 05/15/2022  Lower Venous DVT Study Patient Name:  Natasha Long  Date of Exam:   05/15/2022 Medical Rec #: [id number]          Accession #:    [id number] Date of Birth: 1996-04-06          Patient Gender: F Patient Age:   26 years Exam Location:  [facility] Procedure:      VAS [place] LOWER EXTREMITY VENOUS (DVT) Referring Phys: Loree Fee PLUNKETT --------------------------------------------------------------------------------  Indications: Right knee pain.  Comparison Study: No prior studies. Performing Technologist: Darlin Coco RDMS, RVT  Examination Guidelines:  A complete evaluation includes B-mode imaging, spectral Doppler, color Doppler, and power Doppler as needed of all accessible portions of each vessel. Bilateral testing is considered an integral part of a complete examination. Limited examinations for reoccurring indications may be performed as noted. The reflux portion of the exam is performed with the patient in reverse Trendelenburg.  +---------+---------------+---------+-----------+----------+--------------+ RIGHT    CompressibilityPhasicitySpontaneityPropertiesThrombus Aging +---------+---------------+---------+-----------+----------+--------------+ CFV      Full           Yes      Yes                                 +---------+---------------+---------+-----------+----------+--------------+ SFJ      Full                                                        +---------+---------------+---------+-----------+----------+--------------+ FV Prox  Full                                                        +---------+---------------+---------+-----------+----------+--------------+  FV Mid   Full                                                        +---------+---------------+---------+-----------+----------+--------------+ FV DistalFull                                                        +---------+---------------+---------+-----------+----------+--------------+ PFV      Full                                                        +---------+---------------+---------+-----------+----------+--------------+ POP      Full           Yes      Yes                                 +---------+---------------+---------+-----------+----------+--------------+ PTV      Full                                                        +---------+---------------+---------+-----------+----------+--------------+ PERO     Full                                                         +---------+---------------+---------+-----------+----------+--------------+ Gastroc  Full                                                        +---------+---------------+---------+-----------+----------+--------------+     Summary: RIGHT: - There is no evidence of deep vein thrombosis in the lower extremity.  - No cystic structure found in the popliteal fossa.   *See table(s) above for measurements and observations.    Preliminary    DG Chest 2 View  Result Date: 05/15/2022 CLINICAL DATA:  Shortness of breath, cough and congestion EXAM: CHEST - 2 VIEW COMPARISON:  None Available. FINDINGS: Marked cardiac silhouette enlargement, similar to the prior study. Difficult to exclude pericardial effusion. Similar vascular congestion and basilar atelectasis. No large effusion, significant CHF pattern or definite focal pneumonia. No pneumothorax. Trachea midline. IMPRESSION: Marked cardiomegaly with vascular congestion and basilar atelectasis. See above comment. No significant interval change. Electronically Signed   By: Jerilynn Mages.  Shick M.D.   On: 05/15/2022 07:50    Medications:   ARIPiprazole  7.5 mg Oral Daily   calcitRIOL  0.25 mcg Oral Q T,Th,Sa-HD   calcium acetate  667 mg Oral TID WC   Chlorhexidine Gluconate Cloth  6 each Topical  H0623   Chlorhexidine Gluconate Cloth  6 each Topical Q0600   clotrimazole  1 Applicatorful Vaginal QHS   gabapentin  300 mg Oral TID   heparin  5,000 Units Subcutaneous Q8H   linezolid  600 mg Oral BID   losartan  50 mg Oral Daily   multivitamin  1 tablet Oral Daily   pantoprazole  40 mg Oral Q2200   polyethylene glycol  17 g Oral BID   ramelteon  8 mg Oral QHS   senna-docusate  2 tablet Oral BID   sodium chloride flush  3 mL Intravenous Q12H   torsemide  100 mg Oral q morning    Dialysis Orders: TTS [facility]  3.5h  350/1.5  93kg  2/2.5 bath  P2  Hep 5000   Assessment/Plan: AMS/ somnolence: Felt due to uremia, s/p HD yesterday and again today. More awake today,  BUN improved. Pericardial effusion without tamponade: Likely in setting of uremia and overload. ESRD: Had missed approx 2 weeks of HD prir to admit. HD yesterday and today, now back to usual TTS schedule -> next HD for Tuesday. HTN/ voume: BP high, but improved from yesterday. UF as tolerated, 1.5L UFG. Anemia of ESRD: Hgb 9.5 - follow for now. Secondary HPTH: Ca ok, Phos coming down. Continue binders, ^ dose. Continue VDRA. PTSD/ borderline personality d/o Lip crusting/?impetigo: On course of PO Linezolid )was changed from IV Vanc).  [name], PA-C 05/16/2022, 9:45 AM  [employer]

## 2022-05-16 NOTE — Progress Notes (Addendum)
PROGRESS NOTE  Natasha Long  DJS:[id number] DOB: [birth year] DOA: 05/15/2022 PCP: [name], MD   Brief Narrative:   Patient is a 26 year old female with history of borderline personality disorder with suicidal attempts, ESRD on dialysis, diabetes, hypertension, septic arthritis, noncompliance who presented with lip swelling, missed hemodialysis.  She was recently admitted here with altered mental status found to have uremia because of missing dialysis.  This time she complained of lower lip edema, left leg pain.  Did not go for dialysis since last admission because she was too sleepy.  Nephrology urgently consulted, planning for dialysis with serial sessions.   Assessment & Plan:  Principal Problem:   Uremia of renal origin Active Problems:   ESRD on dialysis [facility])   Lip lesion   Pericardial effusion without cardiac tamponade   Chronic hypertension   Borderline personality disorder (H[facility])   Lower leg pain   High risk sexual behavior   Polysubstance abuse ([place])   ESRD on dialysis with uremia/noncompliance: Supposed to be on dialysis on Monday, Wednesday, Friday.  Has not been dialysed since 6/8 because of noncompliance.  She was somnolent on presentation most likely secondary to uremia.  Nephrology following, started on dialysis.  Might need to serial stations.  Continue  calcitriol, PhosLo, Renvela  Hypertension:Currently  hypertensive.  Continue Coreg, Imdur.  Monitor blood pressure.  Continue as needed medications for severe hypertension.  On losartan at home  Left leg pain:   There is a firm area on the medial side of her left leg which is tender.  Ultrasound of the leg pending.  Lower lip edema: Recent culture with MRSA.  Was on linezolid.  Restarted. Lower lip is swollen, crusted  Pericardial effusion without cardiac tamponade: Echocardiogram showed moderate pericardial effusion without signs of tamponade.  Likely secondary to volume overload.  EF 45 to 50% on last  echo.  Cardiology was consulted on last admission, no further work-up needed.  Volume management as per dialysis  Polysubstance abuse: Prior UDS was positive for cocaine, THC.  Counseled for cessation.  Borderline personality disorder: Psychiatry was following her during her last hospitalization.  Psychiatry consulted because of persistent noncompliance.  Continue Abilify, hydroxyzine, ramelteon  Obesity: BMI 31.3        DVT prophylaxis:heparin injection 5,000 Units Start: 05/15/22 1400     Code Status: Full Code  Family Communication: None at the bedside  Patient status: Inpatient  Patient is from : Home  Anticipated discharge to: Home  Estimated DC date: In 2 to 3 days   Consultants: Nephrology  Procedures: Dialysis  Antimicrobials:  Anti-infectives (From admission, onward)    Start     Dose/Rate Route Frequency Ordered Stop   05/22/22 1400  vancomycin (VANCOREADY) IVPB 2000 mg/400 mL  Status:  Discontinued        2,000 mg 200 mL/hr over 120 Minutes Intravenous Every Fri (Hemodialysis) 05/15/22 1248 05/15/22 1921   05/15/22 2015  linezolid (ZYVOX) tablet 600 mg        600 mg Oral 2 times daily 05/15/22 1921     05/15/22 1249  vancomycin variable dose per unstable renal function (pharmacist dosing)  Status:  Discontinued         Does not apply See admin instructions 05/15/22 1249 05/15/22 1921       Subjective:  Patient seen and examined at the bedside at dialysis suite.  Hemodynamically stable during my evaluation, mild hypertensive.  She was alert and oriented during my evaluation.  Complains of left  leg pain.  Did not want to talk much Objective: Vitals:   05/15/22 1729 05/15/22 2020 05/16/22 0600 05/16/22 0825  BP: (!) 165/100 (!) 187/103  (!) 171/96  Pulse: 85 90  86  Resp: 20 18  17   Temp: 98.4 F (36.9 C) 98 F (36.7 C)  98.4 F (36.9 C)  TempSrc: Oral Oral  Oral  SpO2: 98% 98%  94%  Weight:   94.3 kg     Intake/Output Summary (Last 24 hours)  at 05/16/2022 0853 Last data filed at 05/15/2022 1555 Gross per 24 hour  Intake --  Output 1700 ml  Net -1700 ml   Filed Weights   05/15/22 1209 05/15/22 1553 05/16/22 0600  Weight: 95.8 kg 94.1 kg 94.3 kg    Examination:  General exam: Overall comfortable, not in distress, obese,sleepy HEENT: PERRL, crusted lower lip respiratory system:  no wheezes or crackles  Cardiovascular system: S1 & S2 heard, RRR.  Gastrointestinal system: Abdomen is nondistended, soft and nontender. Central nervous system: Alert and oriented Extremities: Trace bilateral lower extremity  edema, no clubbing ,no cyanosis, left AV fistula, tender area on the medial side of the left leg Skin: No rashes, no ulcers,no icterus     Data Reviewed: I have personally reviewed following labs and imaging studies  CBC: Recent Labs  Lab 05/15/22 0719  WBC 6.6  NEUTROABS 4.3  HGB 9.4*  HCT 30.6*  MCV 95.3  PLT 932   Basic Metabolic Panel: Recent Labs  Lab 05/15/22 0719  NA 140  K 4.5  CL 101  CO2 17*  GLUCOSE 87  BUN 89*  CREATININE 27.60*  CALCIUM 8.1*  PHOS 11.8*     No results found for this or any previous visit (from the past 240 hour(s)).   Radiology Studies: VAS [place] LOWER EXTREMITY VENOUS (DVT) (7a-7p)  Result Date: 05/15/2022  Lower Venous DVT Study Patient Name:  Natasha Long  Date of Exam:   05/15/2022 Medical Rec #: [id number]          Accession #:    [id number] Date of Birth: 1996/05/28          Patient Gender: F Patient Age:   78 years Exam Location:  [facility] Procedure:      VAS [place] LOWER EXTREMITY VENOUS (DVT) Referring Phys: Blanchie Dessert --------------------------------------------------------------------------------  Indications: Right knee pain.  Comparison Study: No prior studies. Performing Technologist: Darlin Coco RDMS, RVT  Examination Guidelines: A complete evaluation includes B-mode imaging, spectral Doppler, color Doppler, and power Doppler as needed of all  accessible portions of each vessel. Bilateral testing is considered an integral part of a complete examination. Limited examinations for reoccurring indications may be performed as noted. The reflux portion of the exam is performed with the patient in reverse Trendelenburg.  +---------+---------------+---------+-----------+----------+--------------+ RIGHT    CompressibilityPhasicitySpontaneityPropertiesThrombus Aging +---------+---------------+---------+-----------+----------+--------------+ CFV      Full           Yes      Yes                                 +---------+---------------+---------+-----------+----------+--------------+ SFJ      Full                                                        +---------+---------------+---------+-----------+----------+--------------+  FV Prox  Full                                                        +---------+---------------+---------+-----------+----------+--------------+ FV Mid   Full                                                        +---------+---------------+---------+-----------+----------+--------------+ FV DistalFull                                                        +---------+---------------+---------+-----------+----------+--------------+ PFV      Full                                                        +---------+---------------+---------+-----------+----------+--------------+ POP      Full           Yes      Yes                                 +---------+---------------+---------+-----------+----------+--------------+ PTV      Full                                                        +---------+---------------+---------+-----------+----------+--------------+ PERO     Full                                                        +---------+---------------+---------+-----------+----------+--------------+ Gastroc  Full                                                         +---------+---------------+---------+-----------+----------+--------------+     Summary: RIGHT: - There is no evidence of deep vein thrombosis in the lower extremity.  - No cystic structure found in the popliteal fossa.   *See table(s) above for measurements and observations.    Preliminary    DG Chest 2 View  Result Date: 05/15/2022 CLINICAL DATA:  Shortness of breath, cough and congestion EXAM: CHEST - 2 VIEW COMPARISON:  None Available. FINDINGS: Marked cardiac silhouette enlargement, similar to the prior study. Difficult to exclude pericardial effusion. Similar vascular congestion and basilar atelectasis. No large effusion, significant CHF pattern or definite focal pneumonia. No pneumothorax. Trachea midline. IMPRESSION: Marked cardiomegaly with vascular congestion and basilar atelectasis. See  above comment. No significant interval change. Electronically Signed   By: Jerilynn Mages.  Shick M.D.   On: 05/15/2022 07:50    Scheduled Meds:  ARIPiprazole  7.5 mg Oral Daily   calcitRIOL  0.25 mcg Oral Q T,Th,Sa-HD   calcium acetate  667 mg Oral TID WC   Chlorhexidine Gluconate Cloth  6 each Topical Q0600   Chlorhexidine Gluconate Cloth  6 each Topical Q0600   clotrimazole  1 Applicatorful Vaginal QHS   gabapentin  300 mg Oral TID   heparin  5,000 Units Subcutaneous Q8H   linezolid  600 mg Oral BID   losartan  50 mg Oral Daily   multivitamin  1 tablet Oral Daily   pantoprazole  40 mg Oral Q2200   polyethylene glycol  17 g Oral BID   ramelteon  8 mg Oral QHS   senna-docusate  2 tablet Oral BID   sodium chloride flush  3 mL Intravenous Q12H   torsemide  100 mg Oral q morning   Continuous Infusions:   LOS: 1 day   Shelly Coss, MD [facility] P6/17/2023, 8:53 AM

## 2022-05-16 NOTE — Progress Notes (Signed)
Pt just arrived back to the unit from HD at this time.   [facility] Victory Strollo

## 2022-05-16 NOTE — Progress Notes (Signed)
TR[facility] floor coverage for both [facility] and [facility] (remote) on night of 05/15/22 into morning of 05/16/22:    I was notified by our inpatient pharmacist, conveying that the patient does not have any peripheral access at this time, in the setting of order for IV vancomycin, with pharmacist recommending conversion of IV vancomycin to p.o. linezolid.   Consequently, I asked that IV vancomycin be discontinued in favor of oral linezolid, with reassessment of peripheral access to occur in the morning.    Babs Bertin, DO Hospitalist

## 2022-05-16 NOTE — Progress Notes (Signed)
Nurse tech went to get pt vs and pt refused stated that she is freezing. Nurse tech turned up the heat and pt had 2 blankets. Another nurse went to try and check the pt vs to see if she is has a temperature due to the complaint of chills. Pt yelled at the nurse and refused to let him get vs. He tried to educate her that we need to see if you have a fever so we can see what is needed. Me the charge nurse went in to attempt to get the vs. As soon as I walked in the room she started yelling at me and the tech "I'm not doing no damn VS". I tried to educate her that you have several blankets already and the temp is turned up if you are still cold we need to check your temperature to see whats going on regarding the chills. She continued to yell and refused VS. Myself, NT, and other nurse asked her to refrain from  yelling, being rude, and disrespectful. She asked for advocate number booklet with that information was given to the pt.

## 2022-05-16 NOTE — Progress Notes (Signed)
Pt just signed and left AMA at this time. Pt refused to have this RN take out her IV and proceeded to get on the elevator. Charge RN called security, who came after the pt walked down and stated that pt had pulled out her IV in the elevator. IV was completely intact and whole laying in the elevator. MD is aware of pt leaving AMA.   Foster Simpson Vineet Kinney

## 2022-05-16 NOTE — Consult Note (Signed)
Brief Consult note  Natasha Long is a 26 y.o. female admitted medically on 05/15/2022  for uremia 2/2 missing diaplysis. She carries the psychiatric diagnoses of Borderline Personality Disorder with previous suicide attempts and possible bipolar affective disorder and has a past medical history of ESRD on HD. Psychiatry was consulted for "readmission, persistent noncompliance" by Dr. Lorin Mercy.   Patient is well known to the psychiatry service and has been consulted for refusing medications in the past; most recently, last admission--see psychiatry consult notes from 6/6 and 6/7. Historically she is minimally engaged with psychiatry service and is non participatory in assessment.   Attempted to interview patient in her room after dialysis today. Patient is found laying in bed in NAD. Upon entering room and introducing myself as the psychiatrist, patient briefly open eyes before closing them and states "stop". When further engagement in assessment was attempted, patient continued to state "stop" repeatedly and raising her voice. Patient became increasingly agitated and expressed that she would like to end assessment.  Unable to assess SI/HI, psychosis; however, per chart review there appears to be no concern regarding suicidally, homicidally or psychosis.    Obtained from chart review d/t minimal engagement in assessment Social History:  Unable to obtain, patient ended interview early   Family History:  The patient's family history includes Diabetes in an other family member; Hypertension in an other family member. She was adopted  Assessment/plan [name] is a 26 y.o. female admitted medically on 05/15/2022  for uremia 2/2 missing diaplysis. She carries the psychiatric diagnoses of Borderline Personality Disorder with previous suicide attempts and possible bipolar affective disorder and has a past medical history of ESRD on HD. Psychiatry was consulted for "readmission, persistent  noncompliance" by Dr. Lorin Mercy.   Patient is minimally participatory in assessment and becomes angry with interview resulting in interview being terminated early. Per chart review, historically patient refuses to participate with psychiatry service/participate minimally.    Likely MDD, Hx Borderline personality disorder, psychological factors affecting medical condition  -recommend to continue  Current outpatient psychotropic medications include gabapentin 300 mg TID, Abilify 7.5 mg daily, and Vistaril 25 mg (all currently ordered) -The most common psychological factors include abnormal coping styles, denial of symptoms, poor adherence to medical treatment, and maladaptive health behavior. These factors are best addressed with outpatient CBT although medications can treat underlying mood disorder(s). If patient is amenable, referral to outpatient therapy would likely be beneficial.  -psychiatry will attempt full assessment tomorrow  Ival Bible, MD

## 2022-05-17 ENCOUNTER — Telehealth (HOSPITAL_COMMUNITY): Payer: Self-pay | Admitting: Nephrology

## 2022-05-17 LAB — GC/CHLAMYDIA PROBE AMP (~~LOC~~) NOT AT ARMC
Chlamydia: NEGATIVE
Comment: NEGATIVE
Comment: NORMAL
Neisseria Gonorrhea: NEGATIVE

## 2022-05-17 NOTE — Telephone Encounter (Signed)
Transition of care contact from inpatient facility  Date of discharge: 6/17 - left AMA Date of contact: 05/17/22 Method: Phone Spoke to: Patient  Patient contacted to discuss transition of care from recent inpatient hospitalization. Patient was admitted to [facility] from 6/16 - 05/16/22 with discharge diagnosis of AMS, uremia, and lip infection.  Medication changes were reviewed. Left without being given antibiotics for her lip - was getting PO Linezolid during admit -> change to doxy for continued MRSA coverage, will send Erx to her pharmacy to complete 5 more days.  Patient will follow up with his/her outpatient HD unit on: Tuesday - stressed importance of going to all HD sessions. She says she will be there.  Declines any additional needs at thsi time.  [name], PA-C [employer] Pager [phone]

## 2022-05-18 ENCOUNTER — Telehealth: Payer: Self-pay

## 2022-05-18 LAB — GLUCOSE, CAPILLARY: Glucose-Capillary: 90 mg/dL (ref 70–99)

## 2022-05-18 NOTE — Telephone Encounter (Signed)
Transition Care Management Unsuccessful Follow-up Telephone Call  Date of discharge and from where:  05/16/2022, [facility] - left AMA  Attempts:  1st Attempt  Reason for unsuccessful TCM follow-up call:  Left voice message on # [phone], call back requested.   Patient has appointment at [facility] with Epimenio Sarin, PA - 06/17/2022.

## 2022-05-18 NOTE — Progress Notes (Signed)
Late Entry note:  Clinic contacted this am to be advised pt left hospital this weekend and should resume care on Tuesday.   Melven Sartorius Renal Navigator [phone]

## 2022-05-19 ENCOUNTER — Telehealth: Payer: Self-pay

## 2022-05-19 NOTE — Telephone Encounter (Signed)
Transition Care Management Unsuccessful Follow-up Telephone Call  Date of discharge and from where:  05/16/2022, [facility] - left AMA   Attempts:  2nd Attempt  Reason for unsuccessful TCM follow-up call:  Left voice message on # [phone], call back requested.    Patient has appointment at [facility] with Epimenio Sarin, PA - 06/17/2022.

## 2022-05-20 ENCOUNTER — Encounter (HOSPITAL_COMMUNITY): Payer: Self-pay

## 2022-05-20 ENCOUNTER — Emergency Department (HOSPITAL_COMMUNITY)
Admission: EM | Admit: 2022-05-20 | Discharge: 2022-05-21 | Disposition: A | Discharge status: Home / Self Care | Payer: Medicaid Other | Attending: Emergency Medicine | Admitting: Emergency Medicine

## 2022-05-20 ENCOUNTER — Telehealth: Payer: Self-pay

## 2022-05-20 DIAGNOSIS — H11422 Conjunctival edema, left eye: Secondary | ICD-10-CM | POA: Insufficient documentation

## 2022-05-20 DIAGNOSIS — F17210 Nicotine dependence, cigarettes, uncomplicated: Secondary | ICD-10-CM | POA: Diagnosis not present

## 2022-05-20 DIAGNOSIS — E875 Hyperkalemia: Secondary | ICD-10-CM | POA: Insufficient documentation

## 2022-05-20 DIAGNOSIS — Z992 Dependence on renal dialysis: Secondary | ICD-10-CM | POA: Insufficient documentation

## 2022-05-20 DIAGNOSIS — I120 Hypertensive chronic kidney disease with stage 5 chronic kidney disease or end stage renal disease: Secondary | ICD-10-CM | POA: Diagnosis not present

## 2022-05-20 DIAGNOSIS — J45909 Unspecified asthma, uncomplicated: Secondary | ICD-10-CM | POA: Insufficient documentation

## 2022-05-20 DIAGNOSIS — N186 End stage renal disease: Secondary | ICD-10-CM | POA: Diagnosis not present

## 2022-05-20 MED ORDER — OXYCODONE-ACETAMINOPHEN 5-325 MG PO TABS
1.0000 | ORAL_TABLET | Freq: Once | ORAL | Status: AC
  Administered 2022-05-20: 1 via ORAL
  Filled 2022-05-20: qty 1

## 2022-05-20 NOTE — Telephone Encounter (Signed)
Transition Care Management Unsuccessful Follow-up Telephone Call  Date of discharge and from where:   05/16/2022, [facility] - left AMA    Attempts:  3rd Attempt  Reason for unsuccessful TCM follow-up call:  Left voice message on # [phone], call back requested.    Patient has appointment at [facility] with Epimenio Sarin, PA - 06/17/2022.
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
## 2022-05-21 ENCOUNTER — Emergency Department (HOSPITAL_COMMUNITY): Payer: Medicaid Other

## 2022-05-21 LAB — COMPREHENSIVE METABOLIC PANEL
ALT: 18 U/L (ref 0–44)
AST: 22 U/L (ref 15–41)
Albumin: 3.3 g/dL — ABNORMAL LOW (ref 3.5–5.0)
Alkaline Phosphatase: 51 U/L (ref 38–126)
Anion gap: 20 — ABNORMAL HIGH (ref 5–15)
BUN: 90 mg/dL — ABNORMAL HIGH (ref 6–20)
CO2: 18 mmol/L — ABNORMAL LOW (ref 22–32)
Calcium: 8.3 mg/dL — ABNORMAL LOW (ref 8.9–10.3)
Chloride: 102 mmol/L (ref 98–111)
Creatinine, Ser: 20.98 mg/dL — ABNORMAL HIGH (ref 0.44–1.00)
GFR, Estimated: 2 mL/min — ABNORMAL LOW (ref >60)
Glucose, Bld: 103 mg/dL — ABNORMAL HIGH (ref 70–99)
Potassium: 4.7 mmol/L (ref 3.5–5.1)
Sodium: 140 mmol/L (ref 135–145)
Total Bilirubin: 0.8 mg/dL (ref 0.3–1.2)
Total Protein: 6.9 g/dL (ref 6.5–8.1)

## 2022-05-21 LAB — CBC
HCT: 30.4 % — ABNORMAL LOW (ref 36.0–46.0)
Hemoglobin: 9.7 g/dL — ABNORMAL LOW (ref 12.0–15.0)
MCH: 29.6 pg (ref 26.0–34.0)
MCHC: 31.9 g/dL (ref 30.0–36.0)
MCV: 92.7 fL (ref 80.0–100.0)
Platelets: 186 10*3/uL (ref 150–400)
RBC: 3.28 MIL/uL — ABNORMAL LOW (ref 3.87–5.11)
RDW: 14.6 % (ref 11.5–15.5)
WBC: 7.8 10*3/uL (ref 4.0–10.5)
nRBC: 0 % (ref 0.0–0.2)

## 2022-05-21 LAB — I-STAT BETA HCG BLOOD, ED (MC, WL, AP ONLY): I-stat hCG, quantitative: <5 m[IU]/mL (ref <5)

## 2022-05-21 MED ORDER — ERYTHROMYCIN 5 MG/GM OP OINT: 1.0000 | TOPICAL_OINTMENT | Freq: Four times a day (QID) | OPHTHALMIC | 0 refills | Status: AC

## 2022-05-21 MED ORDER — HYDROMORPHONE HCL 1 MG/ML IJ SOLN
1.0000 mg | Freq: Once | INTRAMUSCULAR | Status: AC
  Administered 2022-05-21: 1 mg via INTRAMUSCULAR
  Filled 2022-05-21: qty 1

## 2022-05-21 MED ORDER — OXYCODONE HCL 5 MG PO TABS: 5.0000 mg | ORAL_TABLET | ORAL | 0 refills | Status: DC | PRN
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
## 2022-05-21 NOTE — ED Notes (Signed)
Patient transported to CT 

## 2022-05-21 NOTE — ED Notes (Signed)
Patient didn't go to diaylsis on Tues states she doesn't like to go.

## 2022-05-21 NOTE — Discharge Instructions (Addendum)
You were evaluated in the Emergency Department and after careful evaluation, we did not find any emergent condition requiring admission or further testing in the hospital.  Your exam/testing today is overall reassuring.  CT scans did not show any broken bones or emergencies.  Recommend follow-up with the eye specialists.  Use the ointment provided as directed on the affected eye.  Recommend follow-up with the health department or your primary care doctor if you continue to have vaginal symptoms.  Please return to the Emergency Department if you experience any worsening of your condition.   Thank you for allowing [place] to be a part of your care.

## 2022-05-21 NOTE — ED Notes (Signed)
Ice pack applied to left eye.

## 2022-05-22 ENCOUNTER — Other Ambulatory Visit: Payer: Self-pay

## 2022-05-22 ENCOUNTER — Emergency Department (HOSPITAL_COMMUNITY): Payer: Medicaid Other

## 2022-05-22 ENCOUNTER — Emergency Department (HOSPITAL_COMMUNITY)
Admission: EM | Admit: 2022-05-22 | Discharge: 2022-05-27 | Disposition: A | Discharge status: Home / Self Care | Payer: Medicaid Other | Attending: Emergency Medicine | Admitting: Emergency Medicine

## 2022-05-22 DIAGNOSIS — F319 Bipolar disorder, unspecified: Secondary | ICD-10-CM | POA: Insufficient documentation

## 2022-05-22 DIAGNOSIS — F39 Unspecified mood [affective] disorder: Secondary | ICD-10-CM | POA: Diagnosis present

## 2022-05-22 DIAGNOSIS — D631 Anemia in chronic kidney disease: Secondary | ICD-10-CM

## 2022-05-22 DIAGNOSIS — I120 Hypertensive chronic kidney disease with stage 5 chronic kidney disease or end stage renal disease: Secondary | ICD-10-CM | POA: Diagnosis not present

## 2022-05-22 DIAGNOSIS — F603 Borderline personality disorder: Secondary | ICD-10-CM | POA: Diagnosis present

## 2022-05-22 DIAGNOSIS — Z992 Dependence on renal dialysis: Secondary | ICD-10-CM | POA: Insufficient documentation

## 2022-05-22 DIAGNOSIS — N186 End stage renal disease: Secondary | ICD-10-CM | POA: Diagnosis not present

## 2022-05-22 DIAGNOSIS — R519 Headache, unspecified: Secondary | ICD-10-CM | POA: Diagnosis not present

## 2022-05-22 DIAGNOSIS — F1910 Other psychoactive substance abuse, uncomplicated: Secondary | ICD-10-CM | POA: Diagnosis not present

## 2022-05-22 DIAGNOSIS — Z91158 Patient's noncompliance with renal dialysis for other reason: Secondary | ICD-10-CM

## 2022-05-22 DIAGNOSIS — Z79899 Other long term (current) drug therapy: Secondary | ICD-10-CM | POA: Insufficient documentation

## 2022-05-22 DIAGNOSIS — F332 Major depressive disorder, recurrent severe without psychotic features: Secondary | ICD-10-CM | POA: Diagnosis present

## 2022-05-22 DIAGNOSIS — I10 Essential (primary) hypertension: Secondary | ICD-10-CM

## 2022-05-22 DIAGNOSIS — R0602 Shortness of breath: Secondary | ICD-10-CM | POA: Diagnosis not present

## 2022-05-22 DIAGNOSIS — N189 Chronic kidney disease, unspecified: Secondary | ICD-10-CM

## 2022-05-22 LAB — BASIC METABOLIC PANEL
Anion gap: 25 — ABNORMAL HIGH (ref 5–15)
BUN: 97 mg/dL — ABNORMAL HIGH (ref 6–20)
CO2: 15 mmol/L — ABNORMAL LOW (ref 22–32)
Calcium: 9 mg/dL (ref 8.9–10.3)
Chloride: 100 mmol/L (ref 98–111)
Creatinine, Ser: 22.92 mg/dL — ABNORMAL HIGH (ref 0.44–1.00)
GFR, Estimated: 2 mL/min — ABNORMAL LOW (ref >60)
Glucose, Bld: 81 mg/dL (ref 70–99)
Potassium: 5 mmol/L (ref 3.5–5.1)
Sodium: 140 mmol/L (ref 135–145)

## 2022-05-22 LAB — CBC WITH DIFFERENTIAL/PLATELET
Abs Immature Granulocytes: 0.01 10*3/uL (ref 0.00–0.07)
Basophils Absolute: 0 10*3/uL (ref 0.0–0.1)
Basophils Relative: 1 %
Eosinophils Absolute: 0.2 10*3/uL (ref 0.0–0.5)
Eosinophils Relative: 3 %
HCT: 31.4 % — ABNORMAL LOW (ref 36.0–46.0)
Hemoglobin: 9.7 g/dL — ABNORMAL LOW (ref 12.0–15.0)
Immature Granulocytes: 0 %
Lymphocytes Relative: 19 %
Lymphs Abs: 1.2 10*3/uL (ref 0.7–4.0)
MCH: 29.6 pg (ref 26.0–34.0)
MCHC: 30.9 g/dL (ref 30.0–36.0)
MCV: 95.7 fL (ref 80.0–100.0)
Monocytes Absolute: 0.4 10*3/uL (ref 0.1–1.0)
Monocytes Relative: 6 %
Neutro Abs: 4.4 10*3/uL (ref 1.7–7.7)
Neutrophils Relative %: 71 %
Platelets: 165 10*3/uL (ref 150–400)
RBC: 3.28 MIL/uL — ABNORMAL LOW (ref 3.87–5.11)
RDW: 14.7 % (ref 11.5–15.5)
WBC: 6.1 10*3/uL (ref 4.0–10.5)
nRBC: 0 % (ref 0.0–0.2)

## 2022-05-22 LAB — HEPATITIS B SURFACE ANTIGEN: Hepatitis B Surface Ag: NONREACTIVE

## 2022-05-22 MED ORDER — ALTEPLASE 2 MG IJ SOLR: 2.0000 mg | Freq: Once | INTRAMUSCULAR | Status: DC | PRN

## 2022-05-22 MED ORDER — ANTICOAGULANT SODIUM CITRATE 4% (200MG/5ML) IV SOLN
5.0000 mL | Status: DC | PRN
  Filled 2022-05-22: qty 5

## 2022-05-22 MED ORDER — PENTAFLUOROPROP-TETRAFLUOROETH EX AERO
1.0000 | INHALATION_SPRAY | CUTANEOUS | Status: DC | PRN
  Administered 2022-05-23: 1 via TOPICAL
  Filled 2022-05-22: qty 30

## 2022-05-22 MED ORDER — HEPARIN SODIUM (PORCINE) 1000 UNIT/ML DIALYSIS
1000.0000 [IU] | INTRAMUSCULAR | Status: DC | PRN
  Filled 2022-05-22: qty 1

## 2022-05-22 MED ORDER — CHLORHEXIDINE GLUCONATE CLOTH 2 % EX PADS: 6.0000 | MEDICATED_PAD | Freq: Every day | CUTANEOUS | Status: DC

## 2022-05-22 MED ORDER — LIDOCAINE HCL (PF) 1 % IJ SOLN: 5.0000 mL | INTRAMUSCULAR | Status: DC | PRN

## 2022-05-22 MED ORDER — LIDOCAINE-PRILOCAINE 2.5-2.5 % EX CREA
1.0000 | TOPICAL_CREAM | CUTANEOUS | Status: DC | PRN
  Filled 2022-05-22: qty 5

## 2022-05-22 MED ORDER — OXYCODONE-ACETAMINOPHEN 5-325 MG PO TABS
2.0000 | ORAL_TABLET | Freq: Once | ORAL | Status: AC
  Administered 2022-05-22: 2 via ORAL
  Filled 2022-05-22: qty 2

## 2022-05-22 NOTE — ED Provider Notes (Addendum)
Care assumed from Fayrene Helper, PA-C, dialysis patient in need of urgent but not emergent dialysis pending dialysis.  Patient returned from dialysis.  Apparently, he cut the session short, but she is scheduled for dialysis later today.  I have encouraged her to keep that appointment.  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  Advised that those are things that will need to be managed through her primary care provider as an outpatient.  At this point, I do not see any indication for hospital admission and she is discharged.   Dione Booze, MD 05/23/22 [phone]   As patient was being prepared for discharge, she started screaming that she does not want to live and if she is discharged, she will kill herself.  Discharge is canceled and she will be moved to the psychiatric holding area and I have requested consultation with TTS.   Dione Booze, MD 05/23/22 [phone]  I have discussed the case with Dr. Malen Gauze, on-call for nephrology, and she will continue to manage patient's dialysis needs until discharged from the emergency department.   Dione Booze, MD 05/23/22 [phone]

## 2022-05-22 NOTE — ED Notes (Signed)
Pt signed consent form for hemodialysis, witnessed by this RN

## 2022-05-22 NOTE — Discharge Instructions (Addendum)
Please make sure to follow up for your usual scheduled dialysis for further management of your condition.     For your behavioral health needs you are advised to follow up with [facility] at your earliest opportunity:      [facility]      [address], [place], [place] [address]      [phone]      They offer psychiatry/medication management and therapy.  New patients are seen in their walk-in clinic.  Walk-in hours are Monday, Wednesday, Thursday and Friday from 8:00 am - 11:00 am for psychiatry, and Monday and Wednesday from 8:00 am - 11:00 am for therapy.  Walk-in patients are seen on a first come, first served basis, so try to arrive as early as possible for the best chance of being seen the same day.  BE SURE TO TAKE THE ELEVATOR TO THE SECOND FLOOR.  Please note that to be eligible for services you must bring an ID or a piece of mail with your name and a [facility] address.    [facility]) M-F 8am-3pm   [address], [place] [address]   [phone] Services include: laundry, barbering, support groups, case management, phone  & computer access, showers, AA/NA mtgs, mental health/substance abuse nurse, job skills class, disability information, VA assistance, spiritual classes, etc.   [facility]   [address] [place]     [place] (women and children)       [place], [place] [address] [phone] [email or web address] for application and process Application Required  [facility]   [address]    [place] [address]     [phone]                    [place], [place] [address] [phone]([email or web address] application appt.) Application Required  [facility] (women  only)    [address]     [place], [place] [address]     [phone]      Intake starts 6pm daily Need valid ID, SSC, & Police report [employer] [address] [place], [place] [phone] Application Required  [employer] (men only)     [place].      [place], [place]       [place] (Pregnant women only) [address] [place], [place]  [facility]      [address]      [place], [place] [address]     [phone]             [facility] [address] [place], [place] 90 day commitment/SA/Application process  [facility](men only)     [address]     [place], [place]       Check-in at [facility] [address] [place], [place] [address] [phone] Men/Women/Women and Children must be there by 7 pm  [employer]  [place], [place]

## 2022-05-23 MED ORDER — NICOTINE 7 MG/24HR TD PT24
7.0000 mg | MEDICATED_PATCH | Freq: Every day | TRANSDERMAL | Status: DC
Start: 2022-05-23 — End: 2022-05-27
  Administered 2022-05-23 – 2022-05-26 (×2): 7 mg via TRANSDERMAL
  Filled 2022-05-23 (×4): qty 1

## 2022-05-23 MED ORDER — SEVELAMER CARBONATE 800 MG PO TABS
1600.0000 mg | ORAL_TABLET | Freq: Three times a day (TID) | ORAL | Status: DC
  Administered 2022-05-23 – 2022-05-27 (×9): 1600 mg via ORAL
  Filled 2022-05-23 (×14): qty 2

## 2022-05-23 MED ORDER — ACYCLOVIR 5 % EX OINT
TOPICAL_OINTMENT | Freq: Four times a day (QID) | CUTANEOUS | Status: DC
  Filled 2022-05-23 (×2): qty 15

## 2022-05-23 MED ORDER — HYDROCERIN EX CREA
1.0000 | TOPICAL_CREAM | Freq: Two times a day (BID) | CUTANEOUS | Status: DC
  Administered 2022-05-24 – 2022-05-27 (×8): 1 via TOPICAL
  Filled 2022-05-23: qty 113

## 2022-05-23 MED ORDER — CALCIUM ACETATE (PHOS BINDER) 667 MG PO CAPS
667.0000 mg | ORAL_CAPSULE | Freq: Three times a day (TID) | ORAL | Status: DC
  Administered 2022-05-23 – 2022-05-27 (×9): 667 mg via ORAL
  Filled 2022-05-23 (×15): qty 1

## 2022-05-23 MED ORDER — CALCITRIOL 0.25 MCG PO CAPS
0.2500 ug | ORAL_CAPSULE | ORAL | Status: DC
  Filled 2022-05-23 (×2): qty 1

## 2022-05-23 MED ORDER — TORSEMIDE 20 MG PO TABS
100.0000 mg | ORAL_TABLET | Freq: Every morning | ORAL | Status: DC
  Administered 2022-05-23 – 2022-05-27 (×4): 100 mg via ORAL
  Filled 2022-05-23 (×4): qty 5

## 2022-05-23 MED ORDER — POLYETHYLENE GLYCOL 3350 17 G PO PACK
17.0000 g | PACK | Freq: Two times a day (BID) | ORAL | Status: DC
  Administered 2022-05-24 – 2022-05-27 (×5): 17 g via ORAL
  Filled 2022-05-23 (×6): qty 1

## 2022-05-23 MED ORDER — RENA-VITE PO TABS
1.0000 | ORAL_TABLET | Freq: Every day | ORAL | Status: DC
Start: 2022-05-23 — End: 2022-05-27
  Administered 2022-05-23 – 2022-05-27 (×5): 1 via ORAL
  Filled 2022-05-23 (×6): qty 1

## 2022-05-23 MED ORDER — TRAZODONE HCL 100 MG PO TABS
100.0000 mg | ORAL_TABLET | Freq: Every day | ORAL | Status: DC
  Administered 2022-05-23 – 2022-05-26 (×4): 100 mg via ORAL
  Filled 2022-05-23: qty 2
  Filled 2022-05-23 (×2): qty 1
  Filled 2022-05-23: qty 2

## 2022-05-23 MED ORDER — ONDANSETRON HCL 4 MG PO TABS: 4.0000 mg | ORAL_TABLET | Freq: Three times a day (TID) | ORAL | Status: DC | PRN

## 2022-05-23 MED ORDER — GABAPENTIN 300 MG PO CAPS
300.0000 mg | ORAL_CAPSULE | Freq: Three times a day (TID) | ORAL | Status: DC | PRN
  Administered 2022-05-24 – 2022-05-27 (×5): 300 mg via ORAL
  Filled 2022-05-23 (×6): qty 1

## 2022-05-23 MED ORDER — ALUM & MAG HYDROXIDE-SIMETH 200-200-20 MG/5ML PO SUSP: 30.0000 mL | Freq: Four times a day (QID) | ORAL | Status: DC | PRN

## 2022-05-23 MED ORDER — ARIPIPRAZOLE 5 MG PO TABS
7.5000 mg | ORAL_TABLET | Freq: Every day | ORAL | Status: DC
  Administered 2022-05-23 – 2022-05-27 (×5): 7.5 mg via ORAL
  Filled 2022-05-23 (×6): qty 2

## 2022-05-23 MED ORDER — ACETAMINOPHEN 325 MG PO TABS
650.0000 mg | ORAL_TABLET | ORAL | Status: DC | PRN
  Administered 2022-05-23 – 2022-05-26 (×6): 650 mg via ORAL
  Filled 2022-05-23 (×6): qty 2

## 2022-05-23 MED ORDER — PANTOPRAZOLE SODIUM 40 MG PO TBEC
40.0000 mg | DELAYED_RELEASE_TABLET | Freq: Every day | ORAL | Status: DC
  Administered 2022-05-23 – 2022-05-27 (×5): 40 mg via ORAL
  Filled 2022-05-23 (×5): qty 1

## 2022-05-23 MED ORDER — GABAPENTIN 600 MG PO TABS: 300.0000 mg | ORAL_TABLET | Freq: Three times a day (TID) | ORAL | Status: DC | PRN

## 2022-05-23 MED ORDER — ERYTHROMYCIN 5 MG/GM OP OINT
1.0000 | TOPICAL_OINTMENT | Freq: Four times a day (QID) | OPHTHALMIC | Status: DC
Start: 2022-05-23 — End: 2022-05-27
  Administered 2022-05-23 – 2022-05-27 (×11): 1 via OPHTHALMIC
  Filled 2022-05-23 (×3): qty 3.5

## 2022-05-23 MED ORDER — LOSARTAN POTASSIUM 50 MG PO TABS
50.0000 mg | ORAL_TABLET | Freq: Every day | ORAL | Status: DC
  Administered 2022-05-23 – 2022-05-27 (×5): 50 mg via ORAL
  Filled 2022-05-23 (×5): qty 1

## 2022-05-23 MED ORDER — OXYCODONE HCL 5 MG PO TABS
5.0000 mg | ORAL_TABLET | ORAL | Status: DC | PRN
  Administered 2022-05-23 – 2022-05-27 (×12): 5 mg via ORAL
  Filled 2022-05-23 (×12): qty 1

## 2022-05-23 NOTE — ED Notes (Signed)
Pt belongings inventoried by this [employer] fundraiser. No valuables to give to security. Belongings placed in [facility] small locker #3. Copy of inventory sheet placed in behavioral health folder.
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
## 2022-05-24 DIAGNOSIS — F332 Major depressive disorder, recurrent severe without psychotic features: Secondary | ICD-10-CM

## 2022-05-24 LAB — HEPATITIS B SURFACE ANTIBODY,QUALITATIVE: Hep B S Ab: REACTIVE — AB

## 2022-05-24 LAB — I-STAT BETA HCG BLOOD, ED (MC, WL, AP ONLY): I-stat hCG, quantitative: <5 m[IU]/mL (ref <5)

## 2022-05-24 LAB — HEPATITIS B CORE ANTIBODY, TOTAL: Hep B Core Total Ab: NONREACTIVE

## 2022-05-24 LAB — HEPATITIS C ANTIBODY: HCV Ab: NONREACTIVE

## 2022-05-24 MED ORDER — HYDRALAZINE HCL 25 MG PO TABS
25.0000 mg | ORAL_TABLET | Freq: Once | ORAL | Status: AC
  Administered 2022-05-24: 25 mg via ORAL
  Filled 2022-05-24: qty 1

## 2022-05-24 MED ORDER — HYDROXYZINE HCL 25 MG PO TABS
25.0000 mg | ORAL_TABLET | Freq: Three times a day (TID) | ORAL | Status: DC | PRN
  Administered 2022-05-24 – 2022-05-27 (×6): 25 mg via ORAL
  Filled 2022-05-24 (×6): qty 1

## 2022-05-24 NOTE — Progress Notes (Signed)
Brief Progress note Pt remains in ED over the weekend Last dialyzed Friday Will write HD orders for Monday  Please call with questions  Bufford Buttner MD [facility] [phone]

## 2022-05-25 NOTE — Progress Notes (Signed)
Pt arrived from ED calm cooperative some verbal interaction with staff. 1:1 sitter at bedside

## 2022-05-25 NOTE — ED Notes (Signed)
Per report:  Post treatment weight is 86.8kg 3.9 L removed over a 3.5 hr treatment time; treatment was supposed to be 4.5 hrs long, but pt's legs were cramping and treatment was stopped. Pt was given 100 mL NS.  VS: 129/76 HR 77, 98% RA, RR 20, 97.6 oral  Report received from [employer].

## 2022-05-25 NOTE — ED Notes (Signed)
Pt in dialysis per report from [facility]

## 2022-05-25 NOTE — ED Notes (Signed)
Dialysis called and updated on pt transfer

## 2022-05-25 NOTE — ED Notes (Signed)
Transferred to Dialysis

## 2022-05-26 LAB — CBG MONITORING, ED: Glucose-Capillary: 130 mg/dL — ABNORMAL HIGH (ref 70–99)

## 2022-05-26 LAB — HEPATITIS B SURFACE ANTIBODY, QUANTITATIVE: Hep B S AB Quant (Post): >1000 m[IU]/mL (ref >9.9)

## 2022-05-26 MED ORDER — HEPARIN SODIUM (PORCINE) 1000 UNIT/ML DIALYSIS: 5000.0000 [IU] | Freq: Once | INTRAMUSCULAR | Status: DC

## 2022-05-26 MED ORDER — NEPRO/CARBSTEADY PO LIQD
237.0000 mL | ORAL | Status: DC | PRN
  Filled 2022-05-26: qty 237

## 2022-05-26 MED ORDER — CIPROFLOXACIN-DEXAMETHASONE 0.3-0.1 % OT SUSP
4.0000 [drp] | Freq: Two times a day (BID) | OTIC | Status: DC
  Administered 2022-05-26 – 2022-05-27 (×4): 4 [drp] via OTIC
  Filled 2022-05-26 (×3): qty 7.5

## 2022-05-26 NOTE — ED Notes (Signed)
Pt went back in room

## 2022-05-26 NOTE — Progress Notes (Signed)
Inpatient Behavioral Health Placement  Pt meets inpatient criteria per Arvilla Market, NP. There are no available beds at [facility]. Referral was sent to the following facilities;    Destination Service Provider Address Phone Fax  [facility]  [address] [place] [address] [phone]  [facility] [address] [place]  [address], [place] [address] [phone]  [facility]  [address] [place] [address] [phone]  [facility]  [address] [place] [address] [phone]  [facility]  [address] [place] [address] [phone]  [facility]  [address] [place], [place] [address] [phone]  [facility]  [address] [place] [address] [phone]  [facility]  [address] [place], [place] [address] [phone]  [facility]  [address], [place] [address] [phone]  [facility]  [address], [place] [address] [phone]  [facility]  [address] [place] [address] [phone]  [facility]  [address], [place] [address] [phone]  [facility]  [address] [place] [address] [phone]  [facility]  [address] [place] [address] [phone]  [facility]  [address] [place] [address] [phone]  [facility]  [address] [place] [address] [phone]  [facility]  [address] [place], [place] [address] [phone]   [facility]  [address], [place] [address] [phone]  [facility]  [address] [place] [address] [phone]  [facility]  [address] [place] [address] [phone]  [facility]  [address] [place] [address] [phone]   Situation ongoing,  CSW will follow up.   Maryjean Ka, MSW, [facility] 05/26/2022  @ 10:02 PM

## 2022-05-26 NOTE — ED Notes (Signed)
Pt reports she wants to go home . This Clinical research associate informed  EDP Kommor that Pt was leaving . IVC Paper work given EDP.

## 2022-05-26 NOTE — Progress Notes (Signed)
Pt boarding in the ED.  Pt tells me today that she is feeling much better and no longer suicidal and would like to go home.  I have spoken to the RN who will speak to the ED provider about this. She had dialysis here yesterday off schedule. She feels she can make it to Thursday for her next HD. Exam is good, pt attitude is very good and no vol excess by exam. I agree w/ holding off HD til Thursday to get back on schedule. If dc'd pt will just go to next OP HD on Thursday. If remains boarding, we will also wait til Thursday for next HD.   OP HD:  [facility] TTS  3.5h  350/1.5  93kg   2/2.5 bath  P2  AVF  Hep 5000  Vinson Moselle, MD 05/26/2022, 4:24 PM

## 2022-05-27 ENCOUNTER — Encounter (HOSPITAL_COMMUNITY): Payer: Self-pay | Admitting: Nurse Practitioner

## 2022-05-27 MED ORDER — TRAZODONE HCL 50 MG PO TABS
150.0000 mg | ORAL_TABLET | Freq: Every day | ORAL | Status: DC
Start: 2022-05-27 — End: 2022-05-27

## 2022-05-27 NOTE — ED Provider Notes (Signed)
Emergency Medicine Observation Re-evaluation Note  Natasha Long is a 26 y.o. female, seen on rounds today.  Pt initially presented to the ED for complaints of Shortness of Breath Currently, the patient is resting in no distress.  Physical Exam  BP (!) 163/92 (BP Location: Right Arm)   Pulse 95   Temp 98.7 F (37.1 C) (Oral)   Resp 17   Wt 86.8 kg Comment: Standing wt scale.  SpO2 100%   BMI 29.10 kg/m  Physical Exam General: Resting Cardiac: No murmur on my exam Lungs: Clear Psych: No agitation  ED Course / MDM  EKG:EKG Interpretation  Date/Time:  Long May 22 2022 18:22:29 EDT Ventricular Rate:  99 PR Interval:  125 QRS Duration: 93 QT Interval:  387 QTC Calculation: 497 R Axis:   57 Text Interpretation: Sinus rhythm Probable left atrial enlargement Prolonged QT interval Confirmed by Madalyn Rob [phone]) on 05/22/2022 11:30:30 PM  I have reviewed the labs performed to date as well as medications administered while in observation.  Recent changes in the last 24 hours include none reported.  Plan  Current plan is for awaiting inpatient placement. Natasha Long is under involuntary commitment.      Natasha Long, Natasha Allegra, MD 05/27/22 1239

## 2022-05-27 NOTE — BH Assessment (Signed)
[place] Assessment Progress Note   Per Shuvon Rankin, NP, this pt does not require psychiatric hospitalization at this time.  Pt is psychiatrically cleared.  Discharge instructions include referral information for [facility], along with area supportive services for the homeless.  EDP Marda Stalker, MD and pt's nurse, Luellen Pucker, have been notified.  Jalene Mullet, [place] Triage Specialist [phone]

## 2022-05-27 NOTE — ED Notes (Signed)
Pt sleeping pt responds to verbal interaction but does not open eyes.
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
## 2022-05-27 NOTE — ED Notes (Signed)
Pt reports she will go to out PT dialysis on her schedule T-T-S.

## 2022-05-27 NOTE — ED Notes (Signed)
PT placed chair out side of room getting agitated when asked to return to room.

## 2022-05-27 NOTE — ED Notes (Signed)
Pt refusing Dialysis

## 2022-05-27 NOTE — Progress Notes (Signed)
Called to get report to bring pt up for tx, per her primary nurse pt is refusing to come for tx. Juanell Fairly, NP up on the unit and made aware of the situation

## 2022-05-27 NOTE — ED Notes (Signed)
ON site TTS to see PT

## 2022-05-27 NOTE — ED Notes (Signed)
PT reports she will go on Thursday when she is DC home.

## 2022-05-27 NOTE — Progress Notes (Signed)
Pt d/c from ED this afternoon. Contacted [place] and spoke to [facility] to make clinic aware that pt was d/c today and should resume care tomorrow.   Melven Sartorius Renal Navigator [phone]

## 2022-06-01 ENCOUNTER — Ambulatory Visit (HOSPITAL_COMMUNITY): Payer: Medicaid Other | Admitting: Licensed Clinical Social Worker

## 2022-06-01 ENCOUNTER — Encounter (HOSPITAL_COMMUNITY): Payer: Self-pay

## 2022-06-06 ENCOUNTER — Other Ambulatory Visit: Payer: Self-pay

## 2022-06-06 ENCOUNTER — Emergency Department (HOSPITAL_COMMUNITY)
Admission: EM | Admit: 2022-06-06 | Discharge: 2022-06-07 | Discharge status: Against Medical Advice | Payer: Medicaid Other | Attending: Student | Admitting: Student

## 2022-06-06 ENCOUNTER — Encounter (HOSPITAL_COMMUNITY): Payer: Self-pay | Admitting: Emergency Medicine

## 2022-06-06 DIAGNOSIS — Z5321 Procedure and treatment not carried out due to patient leaving prior to being seen by health care provider: Secondary | ICD-10-CM | POA: Diagnosis not present

## 2022-06-06 DIAGNOSIS — R519 Headache, unspecified: Secondary | ICD-10-CM | POA: Diagnosis present

## 2022-06-06 DIAGNOSIS — I10 Essential (primary) hypertension: Secondary | ICD-10-CM | POA: Diagnosis not present

## 2022-06-06 LAB — CBC WITH DIFFERENTIAL/PLATELET
Abs Immature Granulocytes: 0.02 10*3/uL (ref 0.00–0.07)
Basophils Absolute: 0 10*3/uL (ref 0.0–0.1)
Basophils Relative: 0 %
Eosinophils Absolute: 0.3 10*3/uL (ref 0.0–0.5)
Eosinophils Relative: 5 %
HCT: 24.9 % — ABNORMAL LOW (ref 36.0–46.0)
Hemoglobin: 7.9 g/dL — ABNORMAL LOW (ref 12.0–15.0)
Immature Granulocytes: 0 %
Lymphocytes Relative: 15 %
Lymphs Abs: 0.9 10*3/uL (ref 0.7–4.0)
MCH: 29 pg (ref 26.0–34.0)
MCHC: 31.7 g/dL (ref 30.0–36.0)
MCV: 91.5 fL (ref 80.0–100.0)
Monocytes Absolute: 0.3 10*3/uL (ref 0.1–1.0)
Monocytes Relative: 6 %
Neutro Abs: 4.3 10*3/uL (ref 1.7–7.7)
Neutrophils Relative %: 74 %
Platelets: 210 10*3/uL (ref 150–400)
RBC: 2.72 MIL/uL — ABNORMAL LOW (ref 3.87–5.11)
RDW: 14.6 % (ref 11.5–15.5)
WBC: 5.8 10*3/uL (ref 4.0–10.5)
nRBC: 0 % (ref 0.0–0.2)

## 2022-06-06 LAB — I-STAT BETA HCG BLOOD, ED (MC, WL, AP ONLY): I-stat hCG, quantitative: <5 m[IU]/mL (ref <5)

## 2022-06-06 NOTE — ED Triage Notes (Signed)
Patient arrived with EMS from home reports headache this morning while at hemodialysis , hypertensive at arrival BP= 182/113 , she has not taken her antihypertensive for > 1 week .

## 2022-06-07 LAB — COMPREHENSIVE METABOLIC PANEL
ALT: 11 U/L (ref 0–44)
AST: 17 U/L (ref 15–41)
Albumin: 2.9 g/dL — ABNORMAL LOW (ref 3.5–5.0)
Alkaline Phosphatase: 81 U/L (ref 38–126)
Anion gap: 18 — ABNORMAL HIGH (ref 5–15)
BUN: 46 mg/dL — ABNORMAL HIGH (ref 6–20)
CO2: 22 mmol/L (ref 22–32)
Calcium: 8.7 mg/dL — ABNORMAL LOW (ref 8.9–10.3)
Chloride: 96 mmol/L — ABNORMAL LOW (ref 98–111)
Creatinine, Ser: 16.49 mg/dL — ABNORMAL HIGH (ref 0.44–1.00)
GFR, Estimated: 3 mL/min — ABNORMAL LOW (ref >60)
Glucose, Bld: 90 mg/dL (ref 70–99)
Potassium: 3.5 mmol/L (ref 3.5–5.1)
Sodium: 136 mmol/L (ref 135–145)
Total Bilirubin: 0.6 mg/dL (ref 0.3–1.2)
Total Protein: 6.7 g/dL (ref 6.5–8.1)

## 2022-06-07 NOTE — ED Notes (Signed)
PT seen leaving at approximately 0145 and hasn't returned

## 2022-06-17 ENCOUNTER — Ambulatory Visit: Payer: Medicaid Other | Admitting: Physician Assistant

## 2022-06-26 ENCOUNTER — Encounter (HOSPITAL_COMMUNITY): Payer: Self-pay | Admitting: Emergency Medicine

## 2022-06-26 ENCOUNTER — Other Ambulatory Visit: Payer: Self-pay

## 2022-06-26 ENCOUNTER — Inpatient Hospital Stay (HOSPITAL_COMMUNITY)
Admission: EM | Admit: 2022-06-26 | Discharge: 2022-06-29 | DRG: 193 | Discharge status: Against Medical Advice | Payer: Medicaid Other | Attending: Internal Medicine | Admitting: Internal Medicine

## 2022-06-26 ENCOUNTER — Emergency Department (HOSPITAL_COMMUNITY): Payer: Medicaid Other

## 2022-06-26 DIAGNOSIS — N186 End stage renal disease: Secondary | ICD-10-CM

## 2022-06-26 DIAGNOSIS — F1410 Cocaine abuse, uncomplicated: Secondary | ICD-10-CM | POA: Diagnosis present

## 2022-06-26 DIAGNOSIS — J189 Pneumonia, unspecified organism: Secondary | ICD-10-CM | POA: Diagnosis present

## 2022-06-26 DIAGNOSIS — G4700 Insomnia, unspecified: Secondary | ICD-10-CM | POA: Diagnosis present

## 2022-06-26 DIAGNOSIS — E669 Obesity, unspecified: Secondary | ICD-10-CM | POA: Diagnosis present

## 2022-06-26 DIAGNOSIS — Z992 Dependence on renal dialysis: Secondary | ICD-10-CM | POA: Diagnosis not present

## 2022-06-26 DIAGNOSIS — E8770 Fluid overload, unspecified: Secondary | ICD-10-CM | POA: Diagnosis present

## 2022-06-26 DIAGNOSIS — N184 Chronic kidney disease, stage 4 (severe): Principal | ICD-10-CM

## 2022-06-26 DIAGNOSIS — Z833 Family history of diabetes mellitus: Secondary | ICD-10-CM

## 2022-06-26 DIAGNOSIS — M79661 Pain in right lower leg: Secondary | ICD-10-CM | POA: Diagnosis not present

## 2022-06-26 DIAGNOSIS — E872 Acidosis, unspecified: Secondary | ICD-10-CM

## 2022-06-26 DIAGNOSIS — Z79899 Other long term (current) drug therapy: Secondary | ICD-10-CM

## 2022-06-26 DIAGNOSIS — F17210 Nicotine dependence, cigarettes, uncomplicated: Secondary | ICD-10-CM | POA: Diagnosis present

## 2022-06-26 DIAGNOSIS — N189 Chronic kidney disease, unspecified: Secondary | ICD-10-CM

## 2022-06-26 DIAGNOSIS — F603 Borderline personality disorder: Secondary | ICD-10-CM | POA: Diagnosis present

## 2022-06-26 DIAGNOSIS — F1910 Other psychoactive substance abuse, uncomplicated: Secondary | ICD-10-CM | POA: Diagnosis not present

## 2022-06-26 DIAGNOSIS — E875 Hyperkalemia: Secondary | ICD-10-CM | POA: Diagnosis present

## 2022-06-26 DIAGNOSIS — F32A Depression, unspecified: Secondary | ICD-10-CM | POA: Diagnosis present

## 2022-06-26 DIAGNOSIS — D631 Anemia in chronic kidney disease: Secondary | ICD-10-CM | POA: Diagnosis present

## 2022-06-26 DIAGNOSIS — Z8249 Family history of ischemic heart disease and other diseases of the circulatory system: Secondary | ICD-10-CM

## 2022-06-26 DIAGNOSIS — G629 Polyneuropathy, unspecified: Secondary | ICD-10-CM | POA: Diagnosis present

## 2022-06-26 DIAGNOSIS — E8779 Other fluid overload: Secondary | ICD-10-CM | POA: Diagnosis not present

## 2022-06-26 DIAGNOSIS — F4310 Post-traumatic stress disorder, unspecified: Secondary | ICD-10-CM | POA: Diagnosis present

## 2022-06-26 DIAGNOSIS — Z91199 Patient's noncompliance with other medical treatment and regimen due to unspecified reason: Secondary | ICD-10-CM | POA: Diagnosis not present

## 2022-06-26 DIAGNOSIS — Z6831 Body mass index (BMI) 31.0-31.9, adult: Secondary | ICD-10-CM | POA: Diagnosis not present

## 2022-06-26 DIAGNOSIS — N179 Acute kidney failure, unspecified: Secondary | ICD-10-CM | POA: Diagnosis present

## 2022-06-26 DIAGNOSIS — I739 Peripheral vascular disease, unspecified: Secondary | ICD-10-CM | POA: Diagnosis present

## 2022-06-26 DIAGNOSIS — I120 Hypertensive chronic kidney disease with stage 5 chronic kidney disease or end stage renal disease: Secondary | ICD-10-CM | POA: Diagnosis present

## 2022-06-26 DIAGNOSIS — D649 Anemia, unspecified: Secondary | ICD-10-CM | POA: Diagnosis present

## 2022-06-26 DIAGNOSIS — M79662 Pain in left lower leg: Secondary | ICD-10-CM | POA: Diagnosis not present

## 2022-06-26 DIAGNOSIS — Z888 Allergy status to other drugs, medicaments and biological substances status: Secondary | ICD-10-CM | POA: Diagnosis not present

## 2022-06-26 DIAGNOSIS — D509 Iron deficiency anemia, unspecified: Secondary | ICD-10-CM | POA: Diagnosis present

## 2022-06-26 DIAGNOSIS — N2581 Secondary hyperparathyroidism of renal origin: Secondary | ICD-10-CM | POA: Diagnosis present

## 2022-06-26 DIAGNOSIS — Z91148 Patient's other noncompliance with medication regimen for other reason: Secondary | ICD-10-CM

## 2022-06-26 LAB — CBC
HCT: 24.3 % — ABNORMAL LOW (ref 36.0–46.0)
Hemoglobin: 7.6 g/dL — ABNORMAL LOW (ref 12.0–15.0)
MCH: 29.8 pg (ref 26.0–34.0)
MCHC: 31.3 g/dL (ref 30.0–36.0)
MCV: 95.3 fL (ref 80.0–100.0)
Platelets: 238 10*3/uL (ref 150–400)
RBC: 2.55 MIL/uL — ABNORMAL LOW (ref 3.87–5.11)
RDW: 17.2 % — ABNORMAL HIGH (ref 11.5–15.5)
WBC: 7.9 10*3/uL (ref 4.0–10.5)
nRBC: 0 % (ref 0.0–0.2)

## 2022-06-26 LAB — MAGNESIUM: Magnesium: 2.8 mg/dL — ABNORMAL HIGH (ref 1.7–2.4)

## 2022-06-26 LAB — COMPREHENSIVE METABOLIC PANEL
ALT: 47 U/L — ABNORMAL HIGH (ref 0–44)
AST: 59 U/L — ABNORMAL HIGH (ref 15–41)
Albumin: 3.1 g/dL — ABNORMAL LOW (ref 3.5–5.0)
Alkaline Phosphatase: 107 U/L (ref 38–126)
Anion gap: 29 — ABNORMAL HIGH (ref 5–15)
BUN: 123 mg/dL — ABNORMAL HIGH (ref 6–20)
CO2: 13 mmol/L — ABNORMAL LOW (ref 22–32)
Calcium: 9.3 mg/dL (ref 8.9–10.3)
Chloride: 98 mmol/L (ref 98–111)
Creatinine, Ser: 32.88 mg/dL — ABNORMAL HIGH (ref 0.44–1.00)
GFR, Estimated: 1 mL/min — ABNORMAL LOW (ref >60)
Glucose, Bld: 132 mg/dL — ABNORMAL HIGH (ref 70–99)
Potassium: 5.6 mmol/L — ABNORMAL HIGH (ref 3.5–5.1)
Sodium: 140 mmol/L (ref 135–145)
Total Bilirubin: 0.4 mg/dL (ref 0.3–1.2)
Total Protein: 6.8 g/dL (ref 6.5–8.1)

## 2022-06-26 LAB — I-STAT BETA HCG BLOOD, ED (MC, WL, AP ONLY): I-stat hCG, quantitative: <5 m[IU]/mL (ref <5)

## 2022-06-26 MED ORDER — SODIUM CHLORIDE 0.9 % IV SOLN
1.0000 g | Freq: Once | INTRAVENOUS | Status: AC
  Administered 2022-06-26: 1 g via INTRAVENOUS
  Filled 2022-06-26: qty 10

## 2022-06-26 MED ORDER — SODIUM CHLORIDE 0.9 % IV SOLN
1.0000 g | INTRAVENOUS | Status: DC
  Administered 2022-06-28 (×2): 1 g via INTRAVENOUS
  Filled 2022-06-26 (×2): qty 10

## 2022-06-26 MED ORDER — SODIUM CHLORIDE 0.9 % IV SOLN
500.0000 mg | INTRAVENOUS | Status: DC
  Administered 2022-06-27 – 2022-06-28 (×2): 500 mg via INTRAVENOUS
  Filled 2022-06-26 (×3): qty 5

## 2022-06-26 MED ORDER — FENTANYL CITRATE PF 50 MCG/ML IJ SOSY
50.0000 ug | PREFILLED_SYRINGE | Freq: Once | INTRAMUSCULAR | Status: AC
  Administered 2022-06-26: 50 ug via INTRAVENOUS
  Filled 2022-06-26: qty 1

## 2022-06-26 MED ORDER — SODIUM CHLORIDE 0.9 % IV SOLN
500.0000 mg | Freq: Once | INTRAVENOUS | Status: AC
  Administered 2022-06-26: 500 mg via INTRAVENOUS
  Filled 2022-06-26: qty 5

## 2022-06-26 MED ORDER — SODIUM ZIRCONIUM CYCLOSILICATE 5 G PO PACK
5.0000 g | PACK | Freq: Once | ORAL | Status: AC
  Administered 2022-06-27: 5 g via ORAL
  Filled 2022-06-26 (×2): qty 1

## 2022-06-26 MED ORDER — HEPARIN SODIUM (PORCINE) 5000 UNIT/ML IJ SOLN
5000.0000 [IU] | Freq: Three times a day (TID) | INTRAMUSCULAR | Status: DC
  Administered 2022-06-27 – 2022-06-29 (×5): 5000 [IU] via SUBCUTANEOUS
  Filled 2022-06-26 (×6): qty 1

## 2022-06-26 MED ORDER — CHLORHEXIDINE GLUCONATE CLOTH 2 % EX PADS: 6.0000 | MEDICATED_PAD | Freq: Every day | CUTANEOUS | Status: DC

## 2022-06-26 NOTE — Assessment & Plan Note (Signed)
Patient denies any illicit drug use although reported to ED PA that she has been using cocaine.

## 2022-06-26 NOTE — Assessment & Plan Note (Deleted)
Normally MWF. Has been noncompliant with dialysis for at least 9 days. -Creatinine is risen up to 33.  She is fluid overloaded on exam with chest x-ray finding of vascular congestion.  Nephrology Dr. Moshe Cipro is aware and will take for dialysis in the morning.

## 2022-06-26 NOTE — ED Triage Notes (Signed)
Pt BIB [facility] for missed HD treatments, pt has not had HD for 9 days, normally [facility] pt. Pt states she missed HD because she hates HD. Pt also endorses dizziness, lightheadedness, and bilateral leg pain. Noncompliant with BP meds. Endorses increasing crack usage, has been on and off crack for 5 years, last use was yesterday. HD access to L arm. EKG unremarkable per EMS. EMS VS- 170/100, HR 94, SpO2 95% RA.

## 2022-06-26 NOTE — Assessment & Plan Note (Signed)
Potassium of 5.6 without EKG changes.  Will give one-time dose of Lokelma.

## 2022-06-26 NOTE — Assessment & Plan Note (Signed)
ESRD on HD -Normally MWF. Has been noncompliant with dialysis for at least 9 days. -Creatinine is risen up to 33.  She is fluid overloaded on exam with chest x-ray finding of vascular congestion.  Nephrology Dr. Moshe Cipro is aware and will take for dialysis in the morning.
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
## 2022-06-26 NOTE — Assessment & Plan Note (Signed)
W/uremia.  Patient is nonadherent with her dialysis.  Continue to follow with hemodialysis tomorrow.

## 2022-06-26 NOTE — ED Provider Notes (Signed)
[place] EMERGENCY DEPARTMENT Provider Note   CSN: [id number] Arrival date & time: 06/26/22  1848     History  Chief Complaint  Patient presents with   Dizziness    Natasha Long is a 26 y.o. female who presents the emergency department complaining of fluid retention, dizziness and lightheadedness for the past several days.  Patient is a T/R/S dialysis patient, and states that she has not had dialysis for the past 9 days.  She also states that she has not eaten for the past several days that she has been spending her money on crack cocaine.  Last used crack yesterday.  History of hypertension, noncompliant with blood pressure medication.   Dizziness Associated symptoms: shortness of breath   Associated symptoms: no chest pain        Home Medications Prior to Admission medications   Medication Sig Start Date End Date Taking? Authorizing Provider  acetaminophen (TYLENOL) 500 MG tablet Take 2 tablets (1,000 mg total) by mouth every 8 (eight) hours. Patient taking differently: Take 1,000 mg by mouth every 8 (eight) hours as needed for mild pain or headache. 02/16/22   [name], MD  acyclovir ointment (ZOVIRAX) 5 % Apply topically 4 (four) times daily. 05/07/22   Dwyane Dee, MD  ARIPiprazole (ABILIFY) 15 MG tablet Take 0.5 tablets (7.5 mg total) by mouth daily. 12/27/21   Regalado, Belkys A, MD  B Complex-C-Folic Acid (RENAL VITAMIN) 0.8 MG TABS Take 1 tablet by mouth daily. 10/29/21   [provider]  Blood Pressure Monitoring (BLOOD PRESSURE MONITOR 7) DEVI 1 Units by Does not apply route daily. Measure blood pressure daily 02/29/20   [name], MD  calcitRIOL (ROCALTROL) 0.25 MCG capsule Take 0.25 mcg by mouth Every Tuesday,Thursday,and Saturday with dialysis. 09/25/20   [provider]  calcium acetate (PHOSLO) 667 MG capsule Take 667 mg by mouth 3 (three) times daily with meals. 07/03/21   [provider]  Darbepoetin  Alfa (ARANESP) 150 MCG/0.3ML SOSY injection Inject 0.3 mLs (150 mcg total) into the vein every Saturday with hemodialysis. 02/21/22   [name], MD  diclofenac Sodium (VOLTAREN) 1 % GEL Apply 4 g topically 4 (four) times daily as needed (Pain to L wrist and R knee). 02/16/22   [name], MD  diphenhydrAMINE-zinc acetate (BENADRYL) cream Apply 1 application. topically 2 (two) times daily as needed for itching. 02/16/22   [name], MD  doxycycline (VIBRA-TABS) 100 MG tablet Take 100 mg by mouth See admin instructions. Bid  x 5 days 05/17/22   [provider]  gabapentin (NEURONTIN) 600 MG tablet Take 300 mg by mouth 3 (three) times daily as needed (for neuropathy pain). 03/31/22   [provider]  hydrocerin (EUCERIN) CREA Apply 1 application. topically 2 (two) times daily. Patient taking differently: Apply 1 application  topically 2 (two) times daily as needed (dry skin/itching). 02/16/22   [name], MD  hydrOXYzine (VISTARIL) 25 MG capsule Take 1 capsule (25 mg total) by mouth every 8 (eight) hours as needed for anxiety. 03/25/22   [name], MD  lidocaine-prilocaine (EMLA) cream Apply 1 application topically daily as needed (port site access on dialysis days (Tuesday's, Thursday's, and Saturday's). 03/06/21   [provider]  losartan (COZAAR) 50 MG tablet Take 50 mg by mouth daily. 12/11/21   [provider]  methocarbamol (ROBAXIN) 500 MG tablet Take 2 tablets (1,000 mg total) by mouth at bedtime. Patient not taking: Reported on 05/15/2022 02/16/22  [name], MD  mineral oil-hydrophilic petrolatum (AQUAPHOR) ointment Apply 1 Application topically 3 (three) times daily as needed for dry skin or irritation. 05/17/22   [provider]  omeprazole (PRILOSEC) 20 MG capsule TAKE 1 CAPSULE BY MOUTH EVERY DAY Patient taking differently: Take 20 mg by mouth daily as needed (for heartburn or reflux). 04/01/22   [name], MD   oxyCODONE (ROXICODONE) 5 MG immediate release tablet Take 1 tablet (5 mg total) by mouth every 4 (four) hours as needed for severe pain. 05/21/22   Maudie Flakes, MD  polyethylene glycol (MIRALAX / GLYCOLAX) 17 g packet Take 17 g by mouth 2 (two) times daily. Patient not taking: Reported on 05/15/2022 02/16/22   [name], MD  ramelteon (ROZEREM) 8 MG tablet Take 1 tablet (8 mg total) by mouth at bedtime. Patient not taking: Reported on 05/15/2022 02/16/22   [name], MD  senna-docusate (SENOKOT-S) 8.6-50 MG tablet Take 2 tablets by mouth 2 (two) times daily. Patient not taking: Reported on 05/15/2022 02/16/22   [name], MD  sevelamer carbonate (RENVELA) 800 MG tablet Take 2 tablets (1,600 mg total) by mouth 3 (three) times daily with meals. Patient not taking: Reported on 05/15/2022 02/16/22   [name], MD  torsemide (DEMADEX) 100 MG tablet Take 100 mg by mouth every morning. 03/31/22   [provider]  traZODone (DESYREL) 100 MG tablet Take 100 mg by mouth at bedtime.    [provider]      Allergies    Prozac [fluoxetine hcl], Wellbutrin [bupropion], and Prednisone    Review of Systems   Review of Systems  Constitutional:  Negative for chills and fever.  Respiratory:  Positive for shortness of breath.   Cardiovascular:  Positive for leg swelling. Negative for chest pain.  Neurological:  Positive for dizziness and light-headedness.    Physical Exam Updated Vital Signs BP (!) 167/92   Pulse 96   Temp 98.8 F (37.1 C) (Oral)   Resp (!) 25   Ht 5\' 8"  (1.727 m)   Wt 89.4 kg   SpO2 97%   BMI 29.95 kg/m  Physical Exam Vitals and nursing note reviewed.  Constitutional:      Appearance: Normal appearance.  HENT:     Head: Normocephalic and atraumatic.  Eyes:     Conjunctiva/sclera: Conjunctivae normal.  Cardiovascular:     Rate and Rhythm: Normal rate and regular rhythm.     Comments: Unable to palpate pulses due to swelling Pulmonary:      Effort: Pulmonary effort is normal. No respiratory distress.     Breath sounds: Normal breath sounds.  Abdominal:     General: There is no distension.     Palpations: Abdomen is soft.     Tenderness: There is no abdominal tenderness.  Musculoskeletal:     Right lower leg: 1+ Pitting Edema present.     Left lower leg: 1+ Pitting Edema present.  Skin:    General: Skin is warm and dry.  Neurological:     General: No focal deficit present.     Mental Status: She is alert.     ED Results / Procedures / Treatments   Labs (all labs ordered are listed, but only abnormal results are displayed) Labs Reviewed  CBC - Abnormal; Notable for the following components:      Result Value   RBC 2.55 (*)    Hemoglobin 7.6 (*)    HCT 24.3 (*)    RDW 17.2 (*)  All other components within normal limits  COMPREHENSIVE METABOLIC PANEL - Abnormal; Notable for the following components:   Potassium 5.6 (*)    CO2 13 (*)    Glucose, Bld 132 (*)    BUN 123 (*)    Creatinine, Ser 32.88 (*)    Albumin 3.1 (*)    AST 59 (*)    ALT 47 (*)    GFR, Estimated 1 (*)    Anion gap 29 (*)    All other components within normal limits  MAGNESIUM - Abnormal; Notable for the following components:   Magnesium 2.8 (*)    All other components within normal limits  URINALYSIS, ROUTINE W REFLEX MICROSCOPIC  I-STAT BETA HCG BLOOD, ED ([facility], [facility], [facility] ONLY)    EKG EKG Interpretation  Date/Time:  Friday June 26 2022 19:02:04 EDT Ventricular Rate:  97 PR Interval:  128 QRS Duration: 96 QT Interval:  378 QTC Calculation: 481 R Axis:   -22 Text Interpretation: Sinus rhythm Borderline left axis deviation Borderline prolonged QT interval Confirmed by Sherwood Gambler [id number]) on 06/26/2022 7:37:35 PM  Radiology DG Chest 2 View  Result Date: 06/26/2022 CLINICAL DATA:  Shortness of breath. EXAM: CHEST - 2 VIEW COMPARISON:  Two-view chest x-ray 05/22/2022 FINDINGS: Heart is enlarged. Mild pulmonary vascular  congestion present. Asymmetric right lower lobe airspace disease is present. A right pleural effusion is present with fluid into the major fissure. IMPRESSION: 1. Asymmetric right lower lobe airspace disease and effusion concerning for pneumonia. 2. Cardiomegaly and mild pulmonary vascular congestion. Electronically Signed   By: San Morelle M.D.   On: 06/26/2022 20:11    Procedures Procedures    Medications Ordered in ED Medications  cefTRIAXone (ROCEPHIN) 1 g in sodium chloride 0.9 % 100 mL IVPB (has no administration in time range)  azithromycin (ZITHROMAX) 500 mg in sodium chloride 0.9 % 250 mL IVPB (has no administration in time range)    ED Course/ Medical Decision Making/ A&P                           Medical Decision Making Amount and/or Complexity of Data Reviewed Labs: ordered. Radiology: ordered.  This patient is a 26 y.o. female who presents to the ED for concern of lightheadedness and leg swelling after missing dialysis x 9 days.  Past Medical History / Co-morbidities / Social History: Asthma, ESRD on hemodialysis, borderline personality disorder, hypertension, PTSD, cocaine use  Additional history: Chart reviewed. Pertinent results include: Patient seen in the ER for similar symptoms on 6/23 and was dialyzed.  She later reported suicidal ideation, and psychiatric evaluation was initiated.  Physical Exam: Physical exam performed. The pertinent findings include: Hypertensive to 167/92. Normal HR. Bilateral 1+ pitting edema to bilateral legs.   Lab Tests: I ordered, and personally interpreted labs.  The pertinent results include: Hemoglobin 7.6.  Potassium 5.6.  Glucose 132.  BUN 123.  Creatinine 32.88.  GFR 1.  Negative pregnancy.   Imaging Studies: I ordered imaging studies including chest x-ray.. I independently visualized and interpreted imaging which showed Right lower lobe airspace disease and effusion concerning for pneumonia. I agree with the radiologist  interpretation.     Cardiac Monitoring:  The patient was maintained on a cardiac monitor.  My attending physician Dr. Regenia Skeeter viewed and interpreted the cardiac monitored which showed an underlying rhythm of: sinus rhythm. I agree with this interpretation.   Medications: I ordered medication including empiric ABX  for pneumonia  and fentanyl for pain. I have reviewed the patients home medicines and have made adjustments as needed.  Consultations Obtained: I requested consultation with the nephrologist Dr Moshe Cipro,  and discussed lab and imaging findings as well as pertinent plan - they recommend: medical admission for dialysis in the AM.   I consulted with hospitalist Dr. Flossie Buffy who will admit.    Disposition: After consideration of the diagnostic results and the patients response to treatment, I feel that patient requires admission for urgent dialysis and community acquired pneumonia.   I discussed this case with my attending physician Dr. Regenia Skeeter who cosigned this note including patient's presenting symptoms, physical exam, and planned diagnostics and interventions. Attending physician stated agreement with plan or made changes to plan which were implemented.     Final Clinical Impression(s) / ED Diagnoses Final diagnoses:  Acute renal failure superimposed on stage 4 chronic kidney disease, unspecified acute renal failure type ([place])  Hyperkalemia  Community acquired pneumonia of right lower lobe of lung    Rx / DC Orders ED Discharge Orders     None      Portions of this report may have been transcribed using voice recognition software. Every effort was made to ensure accuracy; however, inadvertent computerized transcription errors may be present.    Estill Cotta 06/26/22 2156    Sherwood Gambler, MD 06/29/22 2216

## 2022-06-26 NOTE — Assessment & Plan Note (Signed)
Hemoglobin has gradually been downtrending over the past month from 12 -->9--> 7.9.  -She mentions possible coffee-ground emesis but has no abdominal pain or dark stools.  Patient also limited historian so difficult to know accuracy of her history.  Suspect this is more likely due to worsening renal function and anemia of chronic disease.  Has previously required ESA with nephrology.  Appreciate further recommendation from nephrology tomorrow. -Continue to follow CBC

## 2022-06-26 NOTE — Assessment & Plan Note (Signed)
Chest x-ray concerning for right lower lobe pneumonia. - Continue IV Rocephin and azithromycin

## 2022-06-27 ENCOUNTER — Inpatient Hospital Stay (HOSPITAL_COMMUNITY): Payer: Medicaid Other

## 2022-06-27 DIAGNOSIS — E875 Hyperkalemia: Secondary | ICD-10-CM | POA: Diagnosis not present

## 2022-06-27 DIAGNOSIS — J189 Pneumonia, unspecified organism: Secondary | ICD-10-CM

## 2022-06-27 DIAGNOSIS — D631 Anemia in chronic kidney disease: Secondary | ICD-10-CM

## 2022-06-27 DIAGNOSIS — N186 End stage renal disease: Secondary | ICD-10-CM

## 2022-06-27 DIAGNOSIS — N189 Chronic kidney disease, unspecified: Secondary | ICD-10-CM | POA: Diagnosis not present

## 2022-06-27 DIAGNOSIS — Z992 Dependence on renal dialysis: Secondary | ICD-10-CM

## 2022-06-27 LAB — CBC
HCT: 23.9 % — ABNORMAL LOW (ref 36.0–46.0)
Hemoglobin: 7.8 g/dL — ABNORMAL LOW (ref 12.0–15.0)
MCH: 30 pg (ref 26.0–34.0)
MCHC: 32.6 g/dL (ref 30.0–36.0)
MCV: 91.9 fL (ref 80.0–100.0)
Platelets: 259 10*3/uL (ref 150–400)
RBC: 2.6 MIL/uL — ABNORMAL LOW (ref 3.87–5.11)
RDW: 16.9 % — ABNORMAL HIGH (ref 11.5–15.5)
WBC: 9.3 10*3/uL (ref 4.0–10.5)
nRBC: 0.2 % (ref 0.0–0.2)

## 2022-06-27 LAB — BASIC METABOLIC PANEL
Anion gap: 28 — ABNORMAL HIGH (ref 5–15)
BUN: 124 mg/dL — ABNORMAL HIGH (ref 6–20)
CO2: 11 mmol/L — ABNORMAL LOW (ref 22–32)
Calcium: 9 mg/dL (ref 8.9–10.3)
Chloride: 102 mmol/L (ref 98–111)
Creatinine, Ser: 32.89 mg/dL — ABNORMAL HIGH (ref 0.44–1.00)
GFR, Estimated: 1 mL/min — ABNORMAL LOW (ref >60)
Glucose, Bld: 127 mg/dL — ABNORMAL HIGH (ref 70–99)
Potassium: 4.9 mmol/L (ref 3.5–5.1)
Sodium: 141 mmol/L (ref 135–145)

## 2022-06-27 LAB — IRON AND TIBC
Iron: 59 ug/dL (ref 28–170)
Saturation Ratios: 53 % — ABNORMAL HIGH (ref 10.4–31.8)
TIBC: 111 ug/dL — ABNORMAL LOW (ref 250–450)
UIBC: 52 ug/dL

## 2022-06-27 LAB — FERRITIN: Ferritin: 1034 ng/mL — ABNORMAL HIGH (ref 11–307)

## 2022-06-27 LAB — D-DIMER, QUANTITATIVE: D-Dimer, Quant: 5.61 ug/mL-FEU — ABNORMAL HIGH (ref 0.00–0.50)

## 2022-06-27 LAB — HEPATITIS B SURFACE ANTIGEN: Hepatitis B Surface Ag: NONREACTIVE

## 2022-06-27 LAB — HEPATITIS B SURFACE ANTIBODY,QUALITATIVE: Hep B S Ab: REACTIVE — AB

## 2022-06-27 LAB — HEPATITIS B CORE ANTIBODY, TOTAL: Hep B Core Total Ab: NONREACTIVE

## 2022-06-27 LAB — HEPATITIS C ANTIBODY: HCV Ab: NONREACTIVE

## 2022-06-27 MED ORDER — LOSARTAN POTASSIUM 50 MG PO TABS
50.0000 mg | ORAL_TABLET | Freq: Every day | ORAL | Status: DC
  Administered 2022-06-27 – 2022-06-29 (×3): 50 mg via ORAL
  Filled 2022-06-27 (×3): qty 1

## 2022-06-27 MED ORDER — HYDROMORPHONE HCL 2 MG PO TABS
1.0000 mg | ORAL_TABLET | ORAL | Status: AC | PRN
  Administered 2022-06-27 – 2022-06-28 (×2): 2 mg via ORAL
  Filled 2022-06-27 (×2): qty 1

## 2022-06-27 MED ORDER — CALCITRIOL 0.5 MCG PO CAPS
1.5000 ug | ORAL_CAPSULE | ORAL | Status: DC
Start: 2022-06-30 — End: 2022-06-29
  Filled 2022-06-27: qty 3

## 2022-06-27 MED ORDER — ACETAMINOPHEN 325 MG PO TABS: 650.0000 mg | ORAL_TABLET | Freq: Four times a day (QID) | ORAL | Status: DC | PRN

## 2022-06-27 MED ORDER — ANTICOAGULANT SODIUM CITRATE 4% (200MG/5ML) IV SOLN: 5.0000 mL | Status: DC | PRN

## 2022-06-27 MED ORDER — HYDRALAZINE HCL 25 MG PO TABS
25.0000 mg | ORAL_TABLET | Freq: Four times a day (QID) | ORAL | Status: DC | PRN
  Administered 2022-06-27 – 2022-06-29 (×3): 25 mg via ORAL
  Filled 2022-06-27 (×3): qty 1

## 2022-06-27 MED ORDER — PENTAFLUOROPROP-TETRAFLUOROETH EX AERO: 1.0000 | INHALATION_SPRAY | CUTANEOUS | Status: DC | PRN

## 2022-06-27 MED ORDER — ALTEPLASE 2 MG IJ SOLR: 2.0000 mg | Freq: Once | INTRAMUSCULAR | Status: DC | PRN

## 2022-06-27 MED ORDER — DARBEPOETIN ALFA 60 MCG/0.3ML IJ SOSY
60.0000 ug | PREFILLED_SYRINGE | INTRAMUSCULAR | Status: DC
  Filled 2022-06-27: qty 0.3

## 2022-06-27 MED ORDER — FENTANYL CITRATE PF 50 MCG/ML IJ SOSY
25.0000 ug | PREFILLED_SYRINGE | INTRAMUSCULAR | Status: AC | PRN
  Administered 2022-06-27 (×2): 50 ug via INTRAVENOUS
  Filled 2022-06-27 (×2): qty 1

## 2022-06-27 MED ORDER — LIDOCAINE-PRILOCAINE 2.5-2.5 % EX CREA: 1.0000 | TOPICAL_CREAM | CUTANEOUS | Status: DC | PRN

## 2022-06-27 MED ORDER — LIDOCAINE HCL (PF) 1 % IJ SOLN: 5.0000 mL | INTRAMUSCULAR | Status: DC | PRN

## 2022-06-27 MED ORDER — ONDANSETRON HCL 4 MG/2ML IJ SOLN
4.0000 mg | Freq: Once | INTRAMUSCULAR | Status: AC | PRN
  Administered 2022-06-27: 4 mg via INTRAVENOUS
  Filled 2022-06-27: qty 2

## 2022-06-27 NOTE — Progress Notes (Signed)
PROGRESS NOTE    Natasha Long  HRC:[id number] DOB: 1996/03/12 DOA: 06/26/2022 PCP: [name], MD  Outpatient Specialists:     Brief Narrative:  As per H&P done on admission: "Natasha Long is a 26 y.o. female with medical history significant of ESRD on HD MWF, polysubstance abuse, HTN, PVD, PTSD, borderline personality disorder, depression who presents with dizziness, lightheadedness and bilateral leg pain.   Patient is a limited historian. Initially questioned why I am asking her questions.  Patient has not been to dialysis for 9 days since she "does not like dialysis" also has been noncompliant with all of her other medications.  Says she has been dizziness, has felt short of breath with exertion. No coughing. Has more LE edema with pain that she put herself in wheelchair to get around. Denies any vomiting at first but when asked about her anemia tells me she has noticed coffee ground emesis. Denies dark stool. No abdominal pain. Not on menstrual cycle.    In the ED, she was afebrile, tachypneic with hypertension around 170s.  She had hyperkalemia 5.6, creatinine had risen to 32 up from 16 with anion gap of 29, AST of 59, ALT of 47.  No leukocytosis but has downward trending hemoglobin for the past month from 12 now to 7.6.   Chest x-ray with asymmetric right lower lobe airspace disease concerning for pneumonia.  Cardiomegaly and mild pulmonary vascular congestion.   EG was sinus rhythm and no T wave or ST changes.  Corrected QT C interval on my calculation of 458   ED PA discussed with nephrology Dr. Moshe Cipro who will see in consultation  for dialysis in the morning.  She was started on IV Rocephin and azithromycin.  Hospitalist then consulted for admission".  06/27/2022: Patient seen on hemodialysis.  Patient is not a particularly good historian.  Patient tells me that she goes for hemodialysis TTS.  Patient has missed hemodialysis on several occasions.    Assessment  & Plan:   Principal Problem:   Community acquired pneumonia Active Problems:   ESRD on dialysis ([place])   Anemia   Hyperkalemia   Polysubstance abuse ([place])   Metabolic acidosis   Acute kidney injury superimposed on CKD ([place])   Community acquired pneumonia Chest x-ray concerning for right lower lobe pneumonia. - Continue IV Rocephin and azithromycin 06/27/2022: No constitutional symptoms noted.  Patient has been afebrile.  No leukocytosis.  We will repeat chest x-ray after hemodialysis.  We will have a low threshold to de-escalate antibiotics.   Acute kidney injury superimposed on CKD ([place]) ESRD on HD -Normally MWF. Has been noncompliant with dialysis for at least 9 days. -Creatinine is risen up to 33.  She is fluid overloaded on exam with chest x-ray finding of vascular congestion.  Nephrology Dr. Moshe Cipro is aware and will take for dialysis in the morning. 06/27/2022: Patient seen on hemodialysis.  According to the patient, she has been on hemodialysis for about a year.  Patient still makes urine.  Patient tells me that nephrotic syndrome like to her end-stage renal disease.  Patient is not particularly good historian.  Patient is anemic.  Likely anemia of chronic kidney disease.  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  Long-term prognosis is guarded.  Patient is noncompliant.  We will repeat renal panel in the morning.  Patient may need repeat renal replacement therapy tomorrow, however, will defer to the nephrology team.   Metabolic acidosis W/uremia.  Patient is nonadherent with her dialysis.  Continue to follow with hemodialysis tomorrow. 06/27/2022: Hopefully, acidosis will resolve with hemodialysis.   Polysubstance abuse [facility]) Patient denies any illicit drug use although reported to ED PA that she has been using cocaine.   Hyperkalemia Potassium of 5.6 without EKG changes.  Will give one-time dose of Lokelma.   Anemia Hemoglobin has gradually been downtrending  over the past month from 12 -->9--> 7.9.  -She mentions possible coffee-ground emesis but has no abdominal pain or dark stools.  Patient also limited historian so difficult to know accuracy of her history.  Suspect this is more likely due to worsening renal function and anemia of chronic disease.  Has previously required ESA with nephrology.  Appreciate further recommendation from nephrology tomorrow. -Continue to follow CBC 06/27/2022: Likely anemia of CKD.   DVT prophylaxis: Subcutaneous heparin Code Status: Full code Family Communication:  Disposition Plan: Home eventually   Consultants:  Nephrology  Procedures:  Hemodialysis  Antimicrobials:  Rocephin IV azithromycin   Subjective: No new complaints  Objective: Vitals:   06/27/22 1300 06/27/22 1335 06/27/22 1353 06/27/22 1422  BP: (!) 176/98 (!) 176/95  (!) 174/97  Pulse: 97 91  97  Resp: 18 19  (!) 24  Temp:  (!) 97.5 F (36.4 C)  98 F (36.7 C)  TempSrc:    Oral  SpO2: 100% 98%  94%  Weight:   92.5 kg   Height:        Intake/Output Summary (Last 24 hours) at 06/27/2022 1443 Last data filed at 06/27/2022 1335 Gross per 24 hour  Intake 250.05 ml  Output 0.5 ml  Net 249.55 ml   Filed Weights   06/27/22 0858 06/27/22 1109 06/27/22 1353  Weight: 97.6 kg 92.7 kg 92.5 kg    Examination:  General exam: Appears calm and comfortable  Respiratory system: Clear to auscultation.  Cardiovascular system: S1 & S2 heard Gastrointestinal system: Abdomen is nondistended, soft and nontender. No organomegaly or masses felt. Normal bowel sounds heard. Central nervous system: Alert and oriented. No focal neurological deficits. Extremities: Leg edema.  Query lower leg tenderness.  Data Reviewed: I have personally reviewed following labs and imaging studies  CBC: Recent Labs  Lab 06/26/22 1930 06/27/22 0131  WBC 7.9 9.3  HGB 7.6* 7.8*  HCT 24.3* 23.9*  MCV 95.3 91.9  PLT 238 220   Basic Metabolic Panel: Recent  Labs  Lab 06/26/22 1930 06/27/22 0131  NA 140 141  K 5.6* 4.9  CL 98 102  CO2 13* 11*  GLUCOSE 132* 127*  BUN 123* 124*  CREATININE 32.88* 32.89*  CALCIUM 9.3 9.0  MG 2.8*  --    GFR: Estimated Creatinine Clearance: 3 mL/min (A) (by C-G formula based on SCr of 32.89 mg/dL (H)). Liver Function Tests: Recent Labs  Lab 06/26/22 1930  AST 59*  ALT 47*  ALKPHOS 107  BILITOT 0.4  PROT 6.8  ALBUMIN 3.1*   No results for input(s): "LIPASE", "AMYLASE" in the last 168 hours. No results for input(s): "AMMONIA" in the last 168 hours. Coagulation Profile: No results for input(s): "INR", "PROTIME" in the last 168 hours. Cardiac Enzymes: No results for input(s): "CKTOTAL", "CKMB", "CKMBINDEX", "TROPONINI" in the last 168 hours. BNP (last 3 results) No results for input(s): "PROBNP" in the last 8760 hours. HbA1C: No results for input(s): "HGBA1C" in the last 72 hours. CBG: No results for input(s): "GLUCAP" in the last 168 hours. Lipid Profile: No results for input(s): "CHOL", "HDL", "LDLCALC", "TRIG", "CHOLHDL", "LDLDIRECT" in the last 72  hours. Thyroid Function Tests: No results for input(s): "TSH", "T4TOTAL", "FREET4", "T3FREE", "THYROIDAB" in the last 72 hours. Anemia Panel: No results for input(s): "VITAMINB12", "FOLATE", "FERRITIN", "TIBC", "IRON", "RETICCTPCT" in the last 72 hours. Urine analysis:    Component Value Date/Time   COLORURINE YELLOW 06/02/2021 1221   APPEARANCEUR CLOUDY (A) 06/02/2021 1221   LABSPEC 1.010 06/02/2021 1221   PHURINE 7.0 06/02/2021 1221   GLUCOSEU 50 (A) 06/02/2021 1221   HGBUR NEGATIVE 06/02/2021 1221   BILIRUBINUR NEGATIVE 06/02/2021 1221   BILIRUBINUR negative 01/30/2020 1526   BILIRUBINUR neg 09/15/2017 1657   KETONESUR NEGATIVE 06/02/2021 1221   PROTEINUR 100 (A) 06/02/2021 1221   UROBILINOGEN 0.2 01/30/2020 1526   NITRITE NEGATIVE 06/02/2021 1221   LEUKOCYTESUR NEGATIVE 06/02/2021 1221   Sepsis  Labs: @LABRCNTIP (procalcitonin:4,lacticidven:4)  )No results found for this or any previous visit (from the past 240 hour(s)).       Radiology Studies: DG Chest 2 View  Result Date: 06/26/2022 CLINICAL DATA:  Shortness of breath. EXAM: CHEST - 2 VIEW COMPARISON:  Two-view chest x-ray 05/22/2022 FINDINGS: Heart is enlarged. Mild pulmonary vascular congestion present. Asymmetric right lower lobe airspace disease is present. A right pleural effusion is present with fluid into the major fissure. IMPRESSION: 1. Asymmetric right lower lobe airspace disease and effusion concerning for pneumonia. 2. Cardiomegaly and mild pulmonary vascular congestion. Electronically Signed   By: San Morelle M.D.   On: 06/26/2022 20:11        Scheduled Meds:  [START ON 06/30/2022] calcitRIOL  1.5 mcg Oral Q T,Th,Sa-HD   [START ON 06/29/2022] darbepoetin (ARANESP) injection - DIALYSIS  60 mcg Intravenous Q Mon-HD   heparin  5,000 Units Subcutaneous Q8H   Continuous Infusions:  azithromycin     cefTRIAXone (ROCEPHIN)  IV       LOS: 1 day    Time spent: 55 minutes    Dana Allan, MD  [facility] Pager #: [phone] 7PM-7AM contact night coverage as above

## 2022-06-27 NOTE — Progress Notes (Signed)
D Dimer = 5.61. Dr Marthenia Rolling notified. Plan to do LE Vascular study R/O DVT and CT scan vs VQ. Plan to start Eliquis per MD. C/O bilateral leg pain and swelling. MD notified per chat and Amion that pt currently does not have pain med orders.
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
## 2022-06-27 NOTE — Plan of Care (Signed)
  Problem: Health Behavior/Discharge Planning: Goal: Ability to manage health-related needs will improve Outcome: Progressing   Problem: Clinical Measurements: Goal: Ability to maintain clinical measurements within normal limits will improve Outcome: Progressing   

## 2022-06-28 ENCOUNTER — Inpatient Hospital Stay (HOSPITAL_COMMUNITY): Payer: Medicaid Other

## 2022-06-28 DIAGNOSIS — J189 Pneumonia, unspecified organism: Secondary | ICD-10-CM | POA: Diagnosis not present

## 2022-06-28 DIAGNOSIS — Z992 Dependence on renal dialysis: Secondary | ICD-10-CM | POA: Diagnosis not present

## 2022-06-28 DIAGNOSIS — N186 End stage renal disease: Secondary | ICD-10-CM | POA: Diagnosis not present

## 2022-06-28 DIAGNOSIS — E875 Hyperkalemia: Secondary | ICD-10-CM | POA: Diagnosis not present

## 2022-06-28 LAB — RENAL FUNCTION PANEL
Albumin: 2.6 g/dL — ABNORMAL LOW (ref 3.5–5.0)
Anion gap: 18 — ABNORMAL HIGH (ref 5–15)
BUN: 78 mg/dL — ABNORMAL HIGH (ref 6–20)
CO2: 19 mmol/L — ABNORMAL LOW (ref 22–32)
Calcium: 8.3 mg/dL — ABNORMAL LOW (ref 8.9–10.3)
Chloride: 101 mmol/L (ref 98–111)
Creatinine, Ser: 21.62 mg/dL — ABNORMAL HIGH (ref 0.44–1.00)
GFR, Estimated: 2 mL/min — ABNORMAL LOW (ref >60)
Glucose, Bld: 104 mg/dL — ABNORMAL HIGH (ref 70–99)
Phosphorus: 10.1 mg/dL — ABNORMAL HIGH (ref 2.5–4.6)
Potassium: 3.5 mmol/L (ref 3.5–5.1)
Sodium: 138 mmol/L (ref 135–145)

## 2022-06-28 LAB — CBC WITH DIFFERENTIAL/PLATELET
Abs Immature Granulocytes: 0.07 10*3/uL (ref 0.00–0.07)
Basophils Absolute: 0 10*3/uL (ref 0.0–0.1)
Basophils Relative: 0 %
Eosinophils Absolute: 0.2 10*3/uL (ref 0.0–0.5)
Eosinophils Relative: 3 %
HCT: 23.3 % — ABNORMAL LOW (ref 36.0–46.0)
Hemoglobin: 7.6 g/dL — ABNORMAL LOW (ref 12.0–15.0)
Immature Granulocytes: 1 %
Lymphocytes Relative: 15 %
Lymphs Abs: 1.4 10*3/uL (ref 0.7–4.0)
MCH: 29.9 pg (ref 26.0–34.0)
MCHC: 32.6 g/dL (ref 30.0–36.0)
MCV: 91.7 fL (ref 80.0–100.0)
Monocytes Absolute: 0.7 10*3/uL (ref 0.1–1.0)
Monocytes Relative: 8 %
Neutro Abs: 6.4 10*3/uL (ref 1.7–7.7)
Neutrophils Relative %: 73 %
Platelets: 246 10*3/uL (ref 150–400)
RBC: 2.54 MIL/uL — ABNORMAL LOW (ref 3.87–5.11)
RDW: 17.1 % — ABNORMAL HIGH (ref 11.5–15.5)
WBC: 8.8 10*3/uL (ref 4.0–10.5)
nRBC: 0.3 % — ABNORMAL HIGH (ref 0.0–0.2)

## 2022-06-28 LAB — HEPATITIS B SURFACE ANTIBODY, QUANTITATIVE: Hep B S AB Quant (Post): >1000 m[IU]/mL (ref >9.9)

## 2022-06-28 MED ORDER — OXYCODONE-ACETAMINOPHEN 5-325 MG PO TABS
1.0000 | ORAL_TABLET | Freq: Four times a day (QID) | ORAL | Status: DC | PRN
  Administered 2022-06-28 – 2022-06-29 (×3): 1 via ORAL
  Filled 2022-06-28 (×3): qty 1

## 2022-06-28 MED ORDER — ALTEPLASE 2 MG IJ SOLR: 2.0000 mg | Freq: Once | INTRAMUSCULAR | Status: DC | PRN

## 2022-06-28 MED ORDER — CALCIUM ACETATE (PHOS BINDER) 667 MG PO CAPS
667.0000 mg | ORAL_CAPSULE | Freq: Three times a day (TID) | ORAL | Status: DC
  Administered 2022-06-28 – 2022-06-29 (×3): 667 mg via ORAL
  Filled 2022-06-28 (×4): qty 1

## 2022-06-28 MED ORDER — AMLODIPINE BESYLATE 10 MG PO TABS
10.0000 mg | ORAL_TABLET | Freq: Every day | ORAL | Status: DC
Start: 2022-06-28 — End: 2022-06-29
  Administered 2022-06-28 – 2022-06-29 (×2): 10 mg via ORAL
  Filled 2022-06-28 (×2): qty 1

## 2022-06-28 MED ORDER — PENTAFLUOROPROP-TETRAFLUOROETH EX AERO: 1.0000 | INHALATION_SPRAY | CUTANEOUS | Status: DC | PRN

## 2022-06-28 MED ORDER — CHLORHEXIDINE GLUCONATE CLOTH 2 % EX PADS
6.0000 | MEDICATED_PAD | Freq: Every day | CUTANEOUS | Status: DC
Start: 2022-06-28 — End: 2022-06-28

## 2022-06-28 MED ORDER — PROSOURCE PLUS PO LIQD
30.0000 mL | Freq: Three times a day (TID) | ORAL | Status: DC
  Administered 2022-06-28 – 2022-06-29 (×3): 30 mL via ORAL
  Filled 2022-06-28 (×4): qty 30

## 2022-06-28 MED ORDER — LIDOCAINE-PRILOCAINE 2.5-2.5 % EX CREA
1.0000 | TOPICAL_CREAM | CUTANEOUS | Status: DC | PRN
  Filled 2022-06-28: qty 5

## 2022-06-28 MED ORDER — CARVEDILOL 3.125 MG PO TABS
3.1250 mg | ORAL_TABLET | Freq: Two times a day (BID) | ORAL | Status: DC
  Administered 2022-06-29: 3.125 mg via ORAL
  Filled 2022-06-28: qty 1

## 2022-06-28 MED ORDER — ONDANSETRON HCL 4 MG/2ML IJ SOLN
4.0000 mg | Freq: Once | INTRAMUSCULAR | Status: AC | PRN
Start: 2022-06-28 — End: 2022-06-28
  Administered 2022-06-28: 4 mg via INTRAVENOUS
  Filled 2022-06-28: qty 2

## 2022-06-28 MED ORDER — HEPARIN SODIUM (PORCINE) 1000 UNIT/ML DIALYSIS: 1000.0000 [IU] | INTRAMUSCULAR | Status: DC | PRN

## 2022-06-28 MED ORDER — LIDOCAINE HCL (PF) 1 % IJ SOLN: 5.0000 mL | INTRAMUSCULAR | Status: DC | PRN

## 2022-06-28 NOTE — Progress Notes (Signed)
Follow-up PROGRESS NOTE    Natasha Long  WLN:[id number] DOB: 14-Jul-1996 DOA: 06/26/2022 PCP: [name], MD  Outpatient Specialists:     Brief Narrative:  As per H&P done on admission: "Natasha Long is a 26 y.o. female with medical history significant of ESRD on HD MWF, polysubstance abuse, HTN, PVD, PTSD, borderline personality disorder, depression who presents with dizziness, lightheadedness and bilateral leg pain.   Patient is a limited historian. Initially questioned why I am asking her questions.  Patient has not been to dialysis for 9 days since she "does not like dialysis" also has been noncompliant with all of her other medications.  Says she has been dizziness, has felt short of breath with exertion. No coughing. Has more LE edema with pain that she put herself in wheelchair to get around. Denies any vomiting at first but when asked about her anemia tells me she has noticed coffee ground emesis. Denies dark stool. No abdominal pain. Not on menstrual cycle.    In the ED, she was afebrile, tachypneic with hypertension around 170s.  She had hyperkalemia 5.6, creatinine had risen to 32 up from 16 with anion gap of 29, AST of 59, ALT of 47.  No leukocytosis but has downward trending hemoglobin for the past month from 12 now to 7.6.   Chest x-ray with asymmetric right lower lobe airspace disease concerning for pneumonia.  Cardiomegaly and mild pulmonary vascular congestion.   EG was sinus rhythm and no T wave or ST changes.  Corrected QT C interval on my calculation of 458   ED PA discussed with nephrology Dr. Moshe Cipro who will see in consultation  for dialysis in the morning.  She was started on IV Rocephin and azithromycin.  Hospitalist then consulted for admission".  06/27/2022: Patient seen on hemodialysis.  Patient is not a particularly good historian.  Patient tells me that she goes for hemodialysis TTS.  Patient has missed hemodialysis on several occasions.    06/28/2022: Patient seen.  Discussed with nephrology team, patient may be dialyzed today if possible.  We will optimize patient's blood pressure.  Start patient on Norvasc 10 Mg p.o. once daily and Coreg 3.125 Mg p.o. twice daily.   Assessment & Plan:   Principal Problem:   Community acquired pneumonia Active Problems:   ESRD on dialysis ([place])   Anemia   Hyperkalemia   Polysubstance abuse ([place])   Metabolic acidosis   Acute kidney injury superimposed on CKD ([place])   Community acquired pneumonia Chest x-ray concerning for right lower lobe pneumonia. - Continue IV Rocephin and azithromycin 06/27/2022: No constitutional symptoms noted.  Patient has been afebrile.  No leukocytosis.  We will repeat chest x-ray after hemodialysis.  We will have a low threshold to de-escalate antibiotics.   Acute kidney injury superimposed on CKD ([place]) ESRD on HD -Normally MWF. Has been noncompliant with dialysis for at least 9 days. -Creatinine is risen up to 33.  She is fluid overloaded on exam with chest x-ray finding of vascular congestion.  Nephrology Dr. Moshe Cipro is aware and will take for dialysis in the morning. 06/27/2022: Patient seen on hemodialysis.  According to the patient, she has been on hemodialysis for about a year.  Patient still makes urine.  Patient tells me that nephrotic syndrome like to her end-stage renal disease.  Patient is not particularly good historian.  Patient is anemic.  Likely anemia of chronic kidney disease.  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  Long-term prognosis is guarded.  Patient is noncompliant.  We will repeat renal panel in the morning.  Patient may need repeat renal replacement therapy tomorrow, however, will defer to the nephrology team. 06/28/2022: For repeat hemodialysis today if possible.  Provided input is appreciated.   Metabolic acidosis W/uremia.  Patient is nonadherent with her dialysis.  Continue to follow with hemodialysis  tomorrow. 06/27/2022: Hopefully, acidosis will resolve with hemodialysis. 06/28/2022: CO2 is 19 today.   Polysubstance abuse [facility]) Patient denies any illicit drug use although reported to ED PA that she has been using cocaine.   Hyperkalemia Potassium of 5.6 without EKG changes.  Will give one-time dose of Lokelma. 06/28/2022: Resolved.   Anemia Hemoglobin has gradually been downtrending over the past month from 12 -->9--> 7.9.  -She mentions possible coffee-ground emesis but has no abdominal pain or dark stools.  Patient also limited historian so difficult to know accuracy of her history.  Suspect this is more likely due to worsening renal function and anemia of chronic disease.  Has previously required ESA with nephrology.  Appreciate further recommendation from nephrology tomorrow. -Continue to follow CBC 06/27/2022: Likely anemia of CKD. 06/29/2019, patient has iron deficiency of chronic inflammation.  Obesity: -Diet and exercise.   DVT prophylaxis: Subcutaneous heparin Code Status: Full code Family Communication:  Disposition Plan: Home eventually   Consultants:  Nephrology  Procedures:  Hemodialysis  Antimicrobials:  Rocephin IV azithromycin   Subjective: No new complaints  Objective: Vitals:   06/28/22 0156 06/28/22 0226 06/28/22 0426 06/28/22 0800  BP: (!) 164/86 (!) 174/86 (!) 181/97   Pulse:   100   Resp:   20 18  Temp:   98.6 F (37 C)   TempSrc:   Oral   SpO2:   94%   Weight:      Height:        Intake/Output Summary (Last 24 hours) at 06/28/2022 1742 Last data filed at 06/28/2022 0300 Gross per 24 hour  Intake 350 ml  Output --  Net 350 ml    Filed Weights   06/27/22 0858 06/27/22 1109 06/27/22 1353  Weight: 97.6 kg 92.7 kg 92.5 kg    Examination:  General exam: Appears calm and comfortable  Respiratory system: Clear to auscultation.  Cardiovascular system: S1 & S2 heard Gastrointestinal system: Abdomen is nondistended, soft and  nontender. No organomegaly or masses felt. Normal bowel sounds heard. Central nervous system: Alert and oriented. No focal neurological deficits. Extremities: No leg edema.    Data Reviewed: I have personally reviewed following labs and imaging studies  CBC: Recent Labs  Lab 06/26/22 1930 06/27/22 0131 06/28/22 0601  WBC 7.9 9.3 8.8  NEUTROABS  --   --  6.4  HGB 7.6* 7.8* 7.6*  HCT 24.3* 23.9* 23.3*  MCV 95.3 91.9 91.7  PLT 238 259 299    Basic Metabolic Panel: Recent Labs  Lab 06/26/22 1930 06/27/22 0131 06/28/22 0601  NA 140 141 138  K 5.6* 4.9 3.5  CL 98 102 101  CO2 13* 11* 19*  GLUCOSE 132* 127* 104*  BUN 123* 124* 78*  CREATININE 32.88* 32.89* 21.62*  CALCIUM 9.3 9.0 8.3*  MG 2.8*  --   --   PHOS  --   --  10.1*    GFR: Estimated Creatinine Clearance: 4.6 mL/min (A) (by C-G formula based on SCr of 21.62 mg/dL (H)). Liver Function Tests: Recent Labs  Lab 06/26/22 1930 06/28/22 0601  AST 59*  --   ALT 47*  --   ALKPHOS  107  --   BILITOT 0.4  --   PROT 6.8  --   ALBUMIN 3.1* 2.6*    No results for input(s): "LIPASE", "AMYLASE" in the last 168 hours. No results for input(s): "AMMONIA" in the last 168 hours. Coagulation Profile: No results for input(s): "INR", "PROTIME" in the last 168 hours. Cardiac Enzymes: No results for input(s): "CKTOTAL", "CKMB", "CKMBINDEX", "TROPONINI" in the last 168 hours. BNP (last 3 results) No results for input(s): "PROBNP" in the last 8760 hours. HbA1C: No results for input(s): "HGBA1C" in the last 72 hours. CBG: No results for input(s): "GLUCAP" in the last 168 hours. Lipid Profile: No results for input(s): "CHOL", "HDL", "LDLCALC", "TRIG", "CHOLHDL", "LDLDIRECT" in the last 72 hours. Thyroid Function Tests: No results for input(s): "TSH", "T4TOTAL", "FREET4", "T3FREE", "THYROIDAB" in the last 72 hours. Anemia Panel: Recent Labs    06/27/22 1553  FERRITIN 1,034*  TIBC 111*  IRON 59   Urine analysis:     Component Value Date/Time   COLORURINE YELLOW 06/02/2021 1221   APPEARANCEUR CLOUDY (A) 06/02/2021 1221   LABSPEC 1.010 06/02/2021 1221   PHURINE 7.0 06/02/2021 1221   GLUCOSEU 50 (A) 06/02/2021 1221   HGBUR NEGATIVE 06/02/2021 1221   [place] 06/02/2021 1221   BILIRUBINUR negative 01/30/2020 1526   BILIRUBINUR neg 09/15/2017 1657   KETONESUR NEGATIVE 06/02/2021 1221   PROTEINUR 100 (A) 06/02/2021 1221   UROBILINOGEN 0.2 01/30/2020 1526   NITRITE NEGATIVE 06/02/2021 1221   LEUKOCYTESUR NEGATIVE 06/02/2021 1221   Sepsis Labs: @LABRCNTIP (procalcitonin:4,lacticidven:4)  )No results found for this or any previous visit (from the past 240 hour(s)).       Radiology Studies: DG CHEST PORT 1 VIEW  Result Date: 06/28/2022 CLINICAL DATA:  Shortness of breath EXAM: PORTABLE CHEST 1 VIEW COMPARISON:  06/26/2022 FINDINGS: Persistent enlargement of the cardiopericardial silhouette. Pulmonary vascular congestion. Low lung volumes. Interval development of patchy bibasilar airspace opacities, right greater than left. Probable small bilateral pleural effusions. No pneumothorax. IMPRESSION: 1. Interval development of patchy bibasilar airspace opacities, right greater than left, concerning for pneumonia versus asymmetric edema. 2. Probable small bilateral pleural effusions. 3. Persistent enlargement of the cardiopericardial silhouette suggestive of pericardial effusion. Electronically Signed   By: Davina Poke D.O.   On: 06/28/2022 09:52   DG Chest 2 View  Result Date: 06/26/2022 CLINICAL DATA:  Shortness of breath. EXAM: CHEST - 2 VIEW COMPARISON:  Two-view chest x-ray 05/22/2022 FINDINGS: Heart is enlarged. Mild pulmonary vascular congestion present. Asymmetric right lower lobe airspace disease is present. A right pleural effusion is present with fluid into the major fissure. IMPRESSION: 1. Asymmetric right lower lobe airspace disease and effusion concerning for pneumonia. 2.  Cardiomegaly and mild pulmonary vascular congestion. Electronically Signed   By: San Morelle M.D.   On: 06/26/2022 20:11        Scheduled Meds:  (feeding supplement) PROSource Plus  30 mL Oral TID BM   amLODipine  10 mg Oral Daily   [START ON 06/30/2022] calcitRIOL  1.5 mcg Oral Q T,Th,Sa-HD   calcium acetate  667 mg Oral TID WC   [START ON 06/29/2022] carvedilol  3.125 mg Oral BID WC   [START ON 06/29/2022] darbepoetin (ARANESP) injection - DIALYSIS  60 mcg Intravenous Q Mon-HD   heparin  5,000 Units Subcutaneous Q8H   losartan  50 mg Oral Daily   Continuous Infusions:  azithromycin 500 mg (06/27/22 2253)   cefTRIAXone (ROCEPHIN)  IV 1 g (06/28/22 0026)     LOS:  2 days    Time spent: 35 minutes    Dana Allan, MD  [facility] Pager #: [phone] 7PM-7AM contact night coverage as above

## 2022-06-28 NOTE — Progress Notes (Signed)
Paged on call MD around 2330 regarding pt's elevated BP. Pt SBP between 180-190 despite my giving her PRN PO hydralazine at 2130. Dr. Myna Hidalgo ordered to restart her PO Losartan daily and to give immediately. Losartan given right before 0000. BP currently 165/86.

## 2022-06-28 NOTE — Progress Notes (Signed)
Pt expressed want to get cleaned up/shower around 2230. I provided all hygiene supplies and  assisted her to the side of the bed but then she stated her legs hurt too bad to get up and she just wanted pain medication. I offered to help her get cleaned up in bed but she refused. I did assist her in brushing her teeth in bed. I gave her pain medication (PO dilaudid) and pt started to fall asleep. She stated she will bath/shower when she wakes up later or tomorrow. Pt is currently still sleeping. Will continue to monitor.

## 2022-06-28 NOTE — Progress Notes (Cosign Needed Addendum)
[facility] Progress Note   Subjective:    Seen and examined patient at bedside. Opens her eyes to voice and goes right back to sleep. She remains on RA noted O2 sat trend in 90s. She's NAD. Noted minimal UF yesterday d/t cramping, small IV bolus was given. SrCr and BUN trending down. No emergent need for HD today. Plan for HD 7/31.  Objective Vitals:   06/28/22 0126 06/28/22 0156 06/28/22 0226 06/28/22 0426  BP: (!) 175/84 (!) 164/86 (!) 174/86 (!) 181/97  Pulse:    100  Resp:    20  Temp:    98.6 F (37 C)  TempSrc:    Oral  SpO2:    94%  Weight:      Height:       Physical Exam General: Opens eyes to voice; on RA; NAD Heart: S1 and S2; No murmurs, gallops, or rubs Lungs: Clear anteriorly and laterally; No wheezing, rales, or rhonchi Abdomen: Soft and non-tender Extremities: Mild edema noted BLLE-unable to palpate d/t pain Dialysis Access: L AVF (+) B/T   Filed Weights   06/27/22 0858 06/27/22 1109 06/27/22 1353  Weight: 97.6 kg 92.7 kg 92.5 kg    Intake/Output Summary (Last 24 hours) at 06/28/2022 0854 Last data filed at 06/28/2022 0300 Gross per 24 hour  Intake 350 ml  Output 0.5 ml  Net 349.5 ml    Additional Objective Labs: Basic Metabolic Panel: Recent Labs  Lab 06/26/22 1930 06/27/22 0131 06/28/22 0601  NA 140 141 138  K 5.6* 4.9 3.5  CL 98 102 101  CO2 13* 11* 19*  GLUCOSE 132* 127* 104*  BUN 123* 124* 78*  CREATININE 32.88* 32.89* 21.62*  CALCIUM 9.3 9.0 8.3*  PHOS  --   --  10.1*   Liver Function Tests: Recent Labs  Lab 06/26/22 1930 06/28/22 0601  AST 59*  --   ALT 47*  --   ALKPHOS 107  --   BILITOT 0.4  --   PROT 6.8  --   ALBUMIN 3.1* 2.6*   No results for input(s): "LIPASE", "AMYLASE" in the last 168 hours. CBC: Recent Labs  Lab 06/26/22 1930 06/27/22 0131 06/28/22 0601  WBC 7.9 9.3 8.8  NEUTROABS  --   --  6.4  HGB 7.6* 7.8* 7.6*  HCT 24.3* 23.9* 23.3*  MCV 95.3 91.9 91.7  PLT 238 259 246   Blood  Culture    Component Value Date/Time   SDES BLOOD BLOOD RIGHT HAND 05/05/2022 1233   SPECREQUEST  05/05/2022 1233    BOTTLES DRAWN AEROBIC AND ANAEROBIC Blood Culture adequate volume   CULT  05/05/2022 1233    NO GROWTH 5 DAYS Performed at [facility], [place] [address] [place], [place] [address]    REPTSTATUS 05/10/2022 FINAL 05/05/2022 1233    Cardiac Enzymes: No results for input(s): "CKTOTAL", "CKMB", "CKMBINDEX", "TROPONINI" in the last 168 hours. CBG: No results for input(s): "GLUCAP" in the last 168 hours. Iron Studies:  Recent Labs    06/27/22 1553  IRON 59  TIBC 111*  FERRITIN 1,034*   Lab Results  Component Value Date   INR 1.4 (H) 01/30/2022   INR 1.0 08/17/2019   Studies/Results: DG Chest 2 View  Result Date: 06/26/2022 CLINICAL DATA:  Shortness of breath. EXAM: CHEST - 2 VIEW COMPARISON:  Two-view chest x-ray 05/22/2022 FINDINGS: Heart is enlarged. Mild pulmonary vascular congestion present. Asymmetric right lower lobe airspace disease is present. A right pleural effusion is present  with fluid into the major fissure. IMPRESSION: 1. Asymmetric right lower lobe airspace disease and effusion concerning for pneumonia. 2. Cardiomegaly and mild pulmonary vascular congestion. Electronically Signed   By: San Morelle M.D.   On: 06/26/2022 20:11    Medications:  azithromycin 500 mg (06/27/22 2253)   cefTRIAXone (ROCEPHIN)  IV 1 g (06/28/22 0026)    [START ON 06/30/2022] calcitRIOL  1.5 mcg Oral Q T,Th,Sa-HD   [START ON 06/29/2022] darbepoetin (ARANESP) injection - DIALYSIS  60 mcg Intravenous Q Mon-HD   heparin  5,000 Units Subcutaneous Q8H   losartan  50 mg Oral Daily    Dialysis Orders: TTS - [place] 3hrs34min, BFR 350, DFR Auto 1.5,  EDW 93kg, 2K/ 2.5Ca Heparin 5000 units bolus with HD Calcitriol 1.47mcg PO qHD-last dose 06/13/22  Assessment/Plan: CAP-on IV ABX, managed by primary ESRD - on HD TTS. On HD today. Missed around 1 1/2  months worth of HD treatment outpatient. Received HD yesterday and tolerated only minimal UF d/t cramping. SrCr and BUN trending down as expected- 21.62/78. K+ 3.5. Discussed with Dr. Jonnie Finner: we save HD on Sundays for emergent cases as we have to call in the HD RN. No emergent need for HD today so will resume HD 7/31.Follow labs closely. Hypertension/volume -Bps up, noted Losartan 50mg  daily was recently resumed. Follow BP closely. May need to titrate up to 50mg  BID. Hopeful BP will improve with consistent HD. Anemia of CKD -Hgb 7.6. No Fe d/t possible infectious picture. ESA scheduled for tomorrow with HD. Secondary Hyperparathyroidism - CorrCa okay and PO4 high, will resume binders. Continue Calcitriol Nutrition - Renal diet with fluid restriction. Albumin low, will start protein supplements.  Tobie Poet, NP [facility] 06/28/2022,8:54 AM  LOS: 2 days

## 2022-06-29 ENCOUNTER — Inpatient Hospital Stay (HOSPITAL_COMMUNITY): Payer: Medicaid Other

## 2022-06-29 DIAGNOSIS — M79661 Pain in right lower leg: Secondary | ICD-10-CM | POA: Diagnosis not present

## 2022-06-29 DIAGNOSIS — M79662 Pain in left lower leg: Secondary | ICD-10-CM | POA: Diagnosis not present

## 2022-06-29 DIAGNOSIS — N189 Chronic kidney disease, unspecified: Secondary | ICD-10-CM | POA: Diagnosis not present

## 2022-06-29 DIAGNOSIS — N186 End stage renal disease: Secondary | ICD-10-CM | POA: Diagnosis not present

## 2022-06-29 DIAGNOSIS — E875 Hyperkalemia: Secondary | ICD-10-CM | POA: Diagnosis not present

## 2022-06-29 DIAGNOSIS — J189 Pneumonia, unspecified organism: Secondary | ICD-10-CM | POA: Diagnosis not present

## 2022-06-29 LAB — CBC WITH DIFFERENTIAL/PLATELET
Abs Immature Granulocytes: 0.06 10*3/uL (ref 0.00–0.07)
Basophils Absolute: 0 10*3/uL (ref 0.0–0.1)
Basophils Relative: 0 %
Eosinophils Absolute: 0.1 10*3/uL (ref 0.0–0.5)
Eosinophils Relative: 1 %
HCT: 24.2 % — ABNORMAL LOW (ref 36.0–46.0)
Hemoglobin: 7.8 g/dL — ABNORMAL LOW (ref 12.0–15.0)
Immature Granulocytes: 1 %
Lymphocytes Relative: 10 %
Lymphs Abs: 0.9 10*3/uL (ref 0.7–4.0)
MCH: 30.2 pg (ref 26.0–34.0)
MCHC: 32.2 g/dL (ref 30.0–36.0)
MCV: 93.8 fL (ref 80.0–100.0)
Monocytes Absolute: 0.5 10*3/uL (ref 0.1–1.0)
Monocytes Relative: 5 %
Neutro Abs: 7.9 10*3/uL — ABNORMAL HIGH (ref 1.7–7.7)
Neutrophils Relative %: 83 %
Platelets: 272 10*3/uL (ref 150–400)
RBC: 2.58 MIL/uL — ABNORMAL LOW (ref 3.87–5.11)
RDW: 16.8 % — ABNORMAL HIGH (ref 11.5–15.5)
WBC: 9.5 10*3/uL (ref 4.0–10.5)
nRBC: 0 % (ref 0.0–0.2)

## 2022-06-29 LAB — RENAL FUNCTION PANEL
Albumin: 2.5 g/dL — ABNORMAL LOW (ref 3.5–5.0)
Anion gap: 20 — ABNORMAL HIGH (ref 5–15)
BUN: 85 mg/dL — ABNORMAL HIGH (ref 6–20)
CO2: 19 mmol/L — ABNORMAL LOW (ref 22–32)
Calcium: 8.3 mg/dL — ABNORMAL LOW (ref 8.9–10.3)
Chloride: 99 mmol/L (ref 98–111)
Creatinine, Ser: 22.32 mg/dL — ABNORMAL HIGH (ref 0.44–1.00)
GFR, Estimated: 2 mL/min — ABNORMAL LOW (ref >60)
Glucose, Bld: 89 mg/dL (ref 70–99)
Phosphorus: 10.6 mg/dL — ABNORMAL HIGH (ref 2.5–4.6)
Potassium: 4.2 mmol/L (ref 3.5–5.1)
Sodium: 138 mmol/L (ref 135–145)

## 2022-06-29 LAB — MRSA NEXT GEN BY PCR, NASAL: MRSA by PCR Next Gen: DETECTED — AB

## 2022-06-29 NOTE — Progress Notes (Signed)
[name] O2 sat dropped to 71% on the monitor with a good pleth. RN arrived to room, placed pt back on 2L [place] and sat up [place] a little, returning to 98% O2 sat. Pt received a phone call and stated she had to leave the hospital. Once pt was off the call, RN attempted to talk to the patient about staying at the hospital as MD was going to discharge her after her dialysis treatment today. Pt stated she had to leave before 6pm to get to the bank. Pt also stated that "staying in [place] is detrimental to my health" and would not elaborate further, just kept repeating she was leaving. Pt willing to sign AMA form. MD [place] and care team notified of pt's decision.

## 2022-06-29 NOTE — Discharge Summary (Signed)
Physician Discharge Summary   Patient: Natasha Long MRN: [id number] DOB: 05-21-1996  Admit date:     06/26/2022  Discharge date: 06/29/22  Discharge Physician: Bonnell Public   PCP: [name], MD   Recommendations at discharge:   Patient signed himself out of the hospital [place].  Patient claims that she is moving to [employer].  Case management/social work team and renal navigator are working hard to find hemodialysis chair for the patient.  Patient is awake, alert, oriented to time, place and person.  Patient is able to receive and process information, hence, patient has capacity to make medical decisions.  Patient has decided to discharge her cell from the hospital [place].  Discharge Diagnoses: Principal Problem:   Community acquired pneumonia Active Problems:   ESRD on dialysis ([place])   Anemia   Hyperkalemia   Polysubstance abuse ([place])   Metabolic acidosis   Acute kidney injury superimposed on CKD ([place])  Resolved Problems:   * No resolved hospital problems. [facility] Course: Patient is a 26 year old female with past medical history significant for end-stage renal disease on hemodialysis, polysubstance abuse, hypertension, PVD, PTSD, borderline personality and depression.  Apparently, patient has not been compliant with hemodialysis.  Patient may have missed 9 sessions of hemodialysis.  Patient was admitted with uremic symptoms.  Patient underwent hemodialysis on 2 occasions during the hospital stay.  Patient has elected to sign herself out of the hospital [place].  Assessment and Plan: * Community acquired pneumonia Chest x-ray concerning for right lower lobe pneumonia. - Continue IV Rocephin and azithromycin  Acute kidney injury superimposed on CKD ([place]) ESRD on HD -Normally MWF. Has been noncompliant with dialysis for at least 9 days. -Creatinine is risen up to 33.  She is fluid overloaded on exam with chest  x-ray finding of vascular congestion.  Nephrology Dr. Moshe Cipro is aware and will take for dialysis in the morning.  Metabolic acidosis W/uremia.  Patient is nonadherent with her dialysis.  Continue to follow with hemodialysis tomorrow.  Polysubstance abuse [facility]) Patient denies any illicit drug use although reported to ED PA that she has been using cocaine.  Hyperkalemia Potassium of 5.6 without EKG changes.  Will give one-time dose of Lokelma.  Anemia Hemoglobin has gradually been downtrending over the past month from 12 -->9--> 7.9.  -She mentions possible coffee-ground emesis but has no abdominal pain or dark stools.  Patient also limited historian so difficult to know accuracy of her history.  Suspect this is more likely due to worsening renal function and anemia of chronic disease.  Has previously required ESA with nephrology.  Appreciate further recommendation from nephrology tomorrow. -Continue to follow CBC        Consultants: Nephrology Procedures performed: Hemodialysis Disposition: Patient signed herself out of the hospital [place]. Diet recommendation:  Renal diet/diabetic diet DISCHARGE MEDICATION:   Discharge Exam: Filed Weights   06/27/22 0858 06/27/22 1109 06/27/22 1353  Weight: 97.6 kg 92.7 kg 92.5 kg     Condition at discharge:  Patient signed SF8R the hospital [place].  The results of significant diagnostics from this hospitalization (including imaging, microbiology, ancillary and laboratory) are listed below for reference.   Imaging Studies: VAS [place] LOWER EXTREMITY VENOUS (DVT)  Result Date: 06/29/2022  Lower Venous DVT Study Patient Name:  Natasha Long  Date of Exam:   06/29/2022 Medical Rec #: [id number]          Accession #:  [id number] Date of Birth: 05/04/1996          Patient Gender: F Patient Age:   3 years Exam Location:  [facility] Procedure:      VAS [place] LOWER EXTREMITY VENOUS (DVT) Referring Phys:  Leoda Smithhart --------------------------------------------------------------------------------  Indications: Bilateral calf pain.  Risk Factors: Crack cocaine use. Comparison Study: 05/15/2022 negative right lower extremity venous duplex Performing Technologist: Maudry Mayhew MHA, RDMS, RVT, RDCS  Examination Guidelines: A complete evaluation includes B-mode imaging, spectral Doppler, color Doppler, and power Doppler as needed of all accessible portions of each vessel. Bilateral testing is considered an integral part of a complete examination. Limited examinations for reoccurring indications may be performed as noted. The reflux portion of the exam is performed with the patient in reverse Trendelenburg.  +---------+---------------+---------+-----------+----------+--------------+ RIGHT    CompressibilityPhasicitySpontaneityPropertiesThrombus Aging +---------+---------------+---------+-----------+----------+--------------+ CFV      Full           Yes      Yes                                 +---------+---------------+---------+-----------+----------+--------------+ SFJ      Full                                                        +---------+---------------+---------+-----------+----------+--------------+ FV Prox  Full                                                        +---------+---------------+---------+-----------+----------+--------------+ FV Mid   Full                                                        +---------+---------------+---------+-----------+----------+--------------+ FV DistalFull                                                        +---------+---------------+---------+-----------+----------+--------------+ PFV      Full                                                        +---------+---------------+---------+-----------+----------+--------------+ POP      Full           Yes      Yes                                  +---------+---------------+---------+-----------+----------+--------------+ PTV      Full                                                        +---------+---------------+---------+-----------+----------+--------------+  PERO     Full                                                        +---------+---------------+---------+-----------+----------+--------------+   +---------+---------------+---------+-----------+----------+--------------+ LEFT     CompressibilityPhasicitySpontaneityPropertiesThrombus Aging +---------+---------------+---------+-----------+----------+--------------+ CFV      Full           Yes      Yes                                 +---------+---------------+---------+-----------+----------+--------------+ SFJ      Full                                                        +---------+---------------+---------+-----------+----------+--------------+ FV Prox  Full                                                        +---------+---------------+---------+-----------+----------+--------------+ FV Mid   Full                                                        +---------+---------------+---------+-----------+----------+--------------+ FV DistalFull                                                        +---------+---------------+---------+-----------+----------+--------------+ PFV      Full                                                        +---------+---------------+---------+-----------+----------+--------------+ POP      Full           Yes      Yes                                 +---------+---------------+---------+-----------+----------+--------------+ PTV      Full                                                        +---------+---------------+---------+-----------+----------+--------------+ PERO     Full                                                         +---------+---------------+---------+-----------+----------+--------------+  Summary: RIGHT: - There is no evidence of deep vein thrombosis in the lower extremity.  - No cystic structure found in the popliteal fossa.  LEFT: - There is no evidence of deep vein thrombosis in the lower extremity.  - A cystic structure is found in the popliteal fossa.  *See table(s) above for measurements and observations.    Preliminary    DG CHEST PORT 1 VIEW  Result Date: 06/28/2022 CLINICAL DATA:  Shortness of breath EXAM: PORTABLE CHEST 1 VIEW COMPARISON:  06/26/2022 FINDINGS: Persistent enlargement of the cardiopericardial silhouette. Pulmonary vascular congestion. Low lung volumes. Interval development of patchy bibasilar airspace opacities, right greater than left. Probable small bilateral pleural effusions. No pneumothorax. IMPRESSION: 1. Interval development of patchy bibasilar airspace opacities, right greater than left, concerning for pneumonia versus asymmetric edema. 2. Probable small bilateral pleural effusions. 3. Persistent enlargement of the cardiopericardial silhouette suggestive of pericardial effusion. Electronically Signed   By: Davina Poke D.O.   On: 06/28/2022 09:52   DG Chest 2 View  Result Date: 06/26/2022 CLINICAL DATA:  Shortness of breath. EXAM: CHEST - 2 VIEW COMPARISON:  Two-view chest x-ray 05/22/2022 FINDINGS: Heart is enlarged. Mild pulmonary vascular congestion present. Asymmetric right lower lobe airspace disease is present. A right pleural effusion is present with fluid into the major fissure. IMPRESSION: 1. Asymmetric right lower lobe airspace disease and effusion concerning for pneumonia. 2. Cardiomegaly and mild pulmonary vascular congestion. Electronically Signed   By: San Morelle M.D.   On: 06/26/2022 20:11    Microbiology: Results for orders placed or performed during the hospital encounter of 06/26/22  MRSA Next Gen by PCR, Nasal     Status: Abnormal   Collection  Time: 06/29/22 11:30 AM   Specimen: Nasal Mucosa; Nasal Swab  Result Value Ref Range Status   MRSA by PCR Next Gen DETECTED (A) NOT DETECTED Final    Comment: RESULT CALLED TO, READ BACK BY AND VERIFIED WITH: RN Roque Lias [phone] @1339  FH  (NOTE) The GeneXpert MRSA Assay (FDA approved for NASAL specimens only), is one component of a comprehensive MRSA colonization surveillance program. It is not intended to diagnose MRSA infection nor to guide or monitor treatment for MRSA infections. Test performance is not FDA approved in patients less than 36 years old. Performed at [facility], [place] [address] [place], [place] [address]     Labs: CBC: Recent Labs  Lab 06/26/22 1930 06/27/22 0131 06/28/22 0601 06/29/22 0356  WBC 7.9 9.3 8.8 9.5  NEUTROABS  --   --  6.4 7.9*  HGB 7.6* 7.8* 7.6* 7.8*  HCT 24.3* 23.9* 23.3* 24.2*  MCV 95.3 91.9 91.7 93.8  PLT 238 259 246 852   Basic Metabolic Panel: Recent Labs  Lab 06/26/22 1930 06/27/22 0131 06/28/22 0601 06/29/22 0356  NA 140 141 138 138  K 5.6* 4.9 3.5 4.2  CL 98 102 101 99  CO2 13* 11* 19* 19*  GLUCOSE 132* 127* 104* 89  BUN 123* 124* 78* 85*  CREATININE 32.88* 32.89* 21.62* 22.32*  CALCIUM 9.3 9.0 8.3* 8.3*  MG 2.8*  --   --   --   PHOS  --   --  10.1* 10.6*   Liver Function Tests: Recent Labs  Lab 06/26/22 1930 06/28/22 0601 06/29/22 0356  AST 59*  --   --   ALT 47*  --   --   ALKPHOS 107  --   --   BILITOT 0.4  --   --  PROT 6.8  --   --   ALBUMIN 3.1* 2.6* 2.5*   CBG: No results for input(s): "GLUCAP" in the last 168 hours.  Discharge time spent: less than 30 minutes.  Signed: Bonnell Public, MD [facility] 06/29/2022

## 2022-06-29 NOTE — TOC Initial Note (Signed)
Transition of Care [facility]) - Initial/Assessment Note    Patient Details  Name: Natasha Long MRN: [id number] Date of Birth: 1996/05/09  Transition of Care [facility]) CM/SW Contact:    Bethena Roys, RN Phone Number: 06/29/2022, 11:37 AM  Clinical Narrative: Case Manager received a secure chat that the patient needs leg extenders for her wheelchair. Patient received the wheelchair from [employer] years ago. Case Manager called [employer] and they will deliver DME to the room prior to transition home. Patient reports that she is homeless and would like to relocate to [place] and change HD centers. CSW will speak with the patient regarding disposition needs/ resources.                Expected Discharge Plan: Home/Self Care Barriers to Discharge: Continued Medical Work up   Patient Goals and CMS Choice Patient states their goals for this hospitalization and ongoing recovery are:: to return home.   Choice offered to / list presented to : NA  Expected Discharge Plan and Services Expected Discharge Plan: Home/Self Care In-house Referral: Clinical Social Work Discharge Planning Services: CM Consult Post Acute Care Choice: NA Living arrangements for the past 2 months: Homeless                  Prior Living Arrangements/Services Living arrangements for the past 2 months: Homeless Lives with:: Other (Comment) (homeless) Patient language and need for interpreter reviewed:: Yes        Need for Family Participation in Patient Care: No (Comment) Care giver support system in place?: No (comment) Current home services: DME (patient has wheelchair) Criminal Activity/Legal Involvement Pertinent to Current Situation/Hospitalization: No - Comment as needed  Emotional Assessment Appearance:: Appears stated age Attitude/Demeanor/Rapport: Engaged Affect (typically observed): Appropriate Orientation: : Oriented to Self, Oriented to Place, Oriented to  Time, Oriented to Situation Alcohol /  Substance Use: Not Applicable Psych Involvement: No (comment)  Admission diagnosis:  Hyperkalemia [E87.5] Community acquired pneumonia [J18.9] Community acquired pneumonia of right lower lobe of lung [J18.9] Acute renal failure superimposed on stage 4 chronic kidney disease, unspecified acute renal failure type ([place]) [N17.9, N18.4] Patient Active Problem List   Diagnosis Date Noted   Community acquired pneumonia 50/27/7412   Metabolic acidosis 87/86/7672   Acute kidney injury superimposed on CKD ([place]) 06/26/2022   Uremia of renal origin 05/15/2022   High risk sexual behavior 05/15/2022   Polysubstance abuse ([place]) 05/15/2022   Lip lesion 05/06/2022   Vaginal itching 05/06/2022   Left ankle pain 05/06/2022   Contamination of blood culture 05/06/2022   Uremia    Pericardial effusion without cardiac tamponade    Altered mental status 05/03/2022   Syphilis    Pain management    Septic arthritis of multiple joints ([place]) 02/01/2022   Group G streptococcal infection 02/01/2022   Left wrist pain    Hyperkalemia 01/30/2022   Effusion of right knee    Arthritis, septic [facility])    Dialysis patient, noncompliant    Lower leg pain 01/29/2022   Hypokalemia 01/29/2022   Hyperphosphatemia 01/29/2022   Chest pain    Hypervolemia associated with renal insufficiency 12/23/2021   Elevated troponin 12/23/2021   ESRD on dialysis ([place]) 12/04/2021   Tobacco use 08/11/2021   Nipple discharge 08/11/2021   Breast pain 08/11/2021   Exposure to sexually transmitted disease (STD) 09/47/0962   Complication of vascular dialysis catheter 02/12/2021   Moderate protein-calorie malnutrition ([place]) 01/27/2021   Allergy, unspecified, initial encounter 01/23/2021   Anemia in chronic kidney  disease 01/23/2021   End stage renal disease ([place]) 01/23/2021   Iron deficiency anemia, unspecified 01/23/2021   Other specified coagulation defects ([place]) 01/23/2021   Secondary hyperparathyroidism of renal origin ([place])  01/23/2021   Anemia    Suicidal ideations    Influenza vaccine refused 12/19/2020   Morbid obesity ([place]) 01/30/2020   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   Cocaine use, 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[place]) 01/30/2020   Affective psychosis, bipolar ([place]) 06/27/2019   Borderline personality disorder ([place]) 11/16/2018   Moderate cannabis use disorder ([place]) 11/16/2018   Severe recurrent major depression without psychotic features ([place]) 11/15/2018   Chronic hypertension 04/04/2018   PTSD (post-traumatic stress disorder) 04/01/2018   PCP:  [name], MD Pharmacy:   [facility] [place], [place] - [address] [place] [address] Phone: [phone] Fax: [phone]     Readmission Risk Interventions    06/29/2022   11:17 AM  Readmission Risk Prevention Plan  Transportation Screening Complete  Medication Review ([place]) Complete  PCP or Specialist appointment within 3-5 days of discharge Complete  HRI or [place] Complete  SW Recovery Care/Counseling Consult Complete  [place] Not Applicable

## 2022-06-29 NOTE — Progress Notes (Addendum)
Advised by team that is adamant about leaving hospital today. Pt states that she plans to leave for [place], [place] as soon as she can. Pt in need of an HD clinic in [place] area. Advised attending and CSW that navigator is unable to confirm that new clinic placement will be found for pt and that timeline for if/when that would be known is unknown. Team advised that new clinic placement would likely prove difficult due to pt's non-compliance and hx of behaviors. Will attempt to submit a request with [facility] for clinic placement in [place], [place] but cannot make any guarantees of pt's acceptance by any clinic in that area (attending and CSW made aware of this info). Pt advised CSW that the address for the [employer] station be used as her address. Pt provided CSW with phone number [phone] as number to reach pt at for details regarding possible clinic placement in [place].   Melven Sartorius Renal Navigator [phone]  Addendum at 3:00 pm: Googled bus station address (there is not an [facility] in [place], [place], [place] [address]) to find the closest HD clinic. [facility] has several clinics in [place]. Referral made to [facility] admissions this afternoon for review.

## 2022-06-29 NOTE — Progress Notes (Addendum)
[facility] Progress Note   Subjective:   Patient seen and examined in room.  Sleeping.  Opens eyes to verbal stimuli but not answering most questions.  Denies CP and SOB. Tells me to turn off the light.    Objective Vitals:   06/29/22 0000 06/29/22 0407 06/29/22 0617 06/29/22 0758  BP: (!) 182/115 (!) 195/114 (!) 172/95 (!) 185/105  Pulse: 94 99 (!) 101 93  Resp: 18 18  18   Temp:  97.9 F (36.6 C)  97.6 F (36.4 C)  TempSrc:  Oral  Axillary  SpO2:  100% 95% 94%  Weight:      Height:       Physical Exam General:well appearing female in NAD Heart:RRR, no mrg Lungs:mostly CTA anteriorly, nml WOB on RA Abdomen:soft, NTND Extremities:+LE edema Dialysis Access: LU AVF +b/t   Filed Weights   06/27/22 0858 06/27/22 1109 06/27/22 1353  Weight: 97.6 kg 92.7 kg 92.5 kg    Intake/Output Summary (Last 24 hours) at 06/29/2022 0851 Last data filed at 06/28/2022 2234 Gross per 24 hour  Intake 240 ml  Output --  Net 240 ml    Additional Objective Labs: Basic Metabolic Panel: Recent Labs  Lab 06/27/22 0131 06/28/22 0601 06/29/22 0356  NA 141 138 138  K 4.9 3.5 4.2  CL 102 101 99  CO2 11* 19* 19*  GLUCOSE 127* 104* 89  BUN 124* 78* 85*  CREATININE 32.89* 21.62* 22.32*  CALCIUM 9.0 8.3* 8.3*  PHOS  --  10.1* 10.6*   Liver Function Tests: Recent Labs  Lab 06/26/22 1930 06/28/22 0601 06/29/22 0356  AST 59*  --   --   ALT 47*  --   --   ALKPHOS 107  --   --   BILITOT 0.4  --   --   PROT 6.8  --   --   ALBUMIN 3.1* 2.6* 2.5*    CBC: Recent Labs  Lab 06/26/22 1930 06/27/22 0131 06/28/22 0601 06/29/22 0356  WBC 7.9 9.3 8.8 9.5  NEUTROABS  --   --  6.4 7.9*  HGB 7.6* 7.8* 7.6* 7.8*  HCT 24.3* 23.9* 23.3* 24.2*  MCV 95.3 91.9 91.7 93.8  PLT 238 259 246 272   Iron Studies:  Recent Labs    06/27/22 1553  IRON 59  TIBC 111*  FERRITIN 1,034*   Lab Results  Component Value Date   INR 1.4 (H) 01/30/2022   INR 1.0 08/17/2019    Studies/Results: DG CHEST PORT 1 VIEW  Result Date: 06/28/2022 CLINICAL DATA:  Shortness of breath EXAM: PORTABLE CHEST 1 VIEW COMPARISON:  06/26/2022 FINDINGS: Persistent enlargement of the cardiopericardial silhouette. Pulmonary vascular congestion. Low lung volumes. Interval development of patchy bibasilar airspace opacities, right greater than left. Probable small bilateral pleural effusions. No pneumothorax. IMPRESSION: 1. Interval development of patchy bibasilar airspace opacities, right greater than left, concerning for pneumonia versus asymmetric edema. 2. Probable small bilateral pleural effusions. 3. Persistent enlargement of the cardiopericardial silhouette suggestive of pericardial effusion. Electronically Signed   By: Davina Poke D.O.   On: 06/28/2022 09:52    Medications:  azithromycin 500 mg (06/28/22 2234)   cefTRIAXone (ROCEPHIN)  IV 1 g (06/28/22 2353)    (feeding supplement) PROSource Plus  30 mL Oral TID BM   amLODipine  10 mg Oral Daily   [START ON 06/30/2022] calcitRIOL  1.5 mcg Oral Q T,Th,Sa-HD   calcium acetate  667 mg Oral TID WC   carvedilol  3.125 mg Oral  BID WC   darbepoetin (ARANESP) injection - DIALYSIS  60 mcg Intravenous Q Mon-HD   heparin  5,000 Units Subcutaneous Q8H   losartan  50 mg Oral Daily    Dialysis Orders: TTS - [facility] 3hrs70min, BFR 350, DFR Auto 1.5,  EDW 93kg, 2K/ 2.5Ca Heparin 5000 units bolus with HD Calcitriol 1.90mcg PO qHD-last dose 06/13/22   Assessment/Plan: CAP-on IV ABX, managed by primary ESRD - on HD TTS. Missed around 1 1/2 months worth of HD treatment outpatient. HD over the weekend with improvement in labs as expected.  Plan for HD off schedule today and resume regular schedule tomorrow.  Hypertension/volume -BP elevated.  On losartan 50mg  qd, amlodipine 10mg  qd added yesterday and carvedilol 3.129mcg BID added today. CXR yesterday with PNA vs atypical edema.  Did not tolerate UF on Saturday due to severe  cramping.  Continue UF as tolerated.  Under edw, likely has weight loss and will need new dry on d/c.  Anemia of CKD -Hgb 7.6. No Fe d/t possible infection. ESA ordered with HD today. Secondary Hyperparathyroidism - CorrCa okay. PO4 high, binders restarted.  Continue Calcitriol Nutrition - Renal diet with fluid restriction. Albumin low, will start protein supplements. Polysubstance abuse   Jen Mow, PA-C [facility] 06/29/2022,8:51 AM  LOS: 3 days   Nephrology attending: The patient was seen and examined at bedside.  Chart reviewed.  I agree with above. ESRD on HD noncompliant with outpatient treatment missed about 1 and half month admitted with pneumonia on antibiotics.  Extra treatment today for clearance and regular HD tomorrow.  Continue ESA for anemia.  Arrangement for outpatient dialysis and safe discharge planning ongoing.  Katheran James, [facility].

## 2022-06-29 NOTE — Progress Notes (Signed)
Bilateral lower extremity venous duplex completed. Refer to "CV Proc" under chart review to view preliminary results.  06/29/2022 11:51 AM Kelby Aline., MHA, RVT, RDCS, RDMS

## 2022-06-29 NOTE — Progress Notes (Addendum)
Update- CSW spoke with patient regarding her plan for HD when she gets to [place]. CSW asked patient for address of where she will be staying to assist renal navigator with setting up HD for her. She reports she does not have one. Patient informed CSW to use the [employer] address in [place].Patient reports her plan initially for HD once she got there was to go to the ER, until HD center was in place. Patient gave CSW telephone number where she can be reached.[phone]. CSW informed Olivia Mackie renal navigator. Olivia Mackie reports she is going to assist with finding her an HD center near location patient provided. CSW informed MD.No further questions reported at this time.  CSW received consult for homeless and substance use. Patient reports she is homeless.CSW offered patient outpatient substance use resources and area shelter resources. Patient declined both resources.  Patient reports she does have an income and her dc plan is to move to [place]. Patient reports will need bus at dc. CSW provided patient with bus pass for when patient is medically ready for dc.CSW will continue to follow and assist with patients dc planning needs.

## 2022-06-30 ENCOUNTER — Emergency Department (HOSPITAL_COMMUNITY): Payer: Medicaid Other

## 2022-06-30 ENCOUNTER — Inpatient Hospital Stay (HOSPITAL_COMMUNITY)
Admission: EM | Admit: 2022-06-30 | Discharge: 2022-07-03 | DRG: 682 | Discharge status: Against Medical Advice | Payer: Medicaid Other | Attending: Internal Medicine | Admitting: Internal Medicine

## 2022-06-30 ENCOUNTER — Other Ambulatory Visit: Payer: Self-pay

## 2022-06-30 ENCOUNTER — Encounter (HOSPITAL_COMMUNITY): Payer: Self-pay | Admitting: Emergency Medicine

## 2022-06-30 DIAGNOSIS — Z91199 Patient's noncompliance with other medical treatment and regimen due to unspecified reason: Secondary | ICD-10-CM

## 2022-06-30 DIAGNOSIS — I3139 Other pericardial effusion (noninflammatory): Secondary | ICD-10-CM | POA: Diagnosis present

## 2022-06-30 DIAGNOSIS — D631 Anemia in chronic kidney disease: Secondary | ICD-10-CM | POA: Diagnosis present

## 2022-06-30 DIAGNOSIS — Z5900 Homelessness unspecified: Secondary | ICD-10-CM

## 2022-06-30 DIAGNOSIS — G629 Polyneuropathy, unspecified: Secondary | ICD-10-CM | POA: Diagnosis present

## 2022-06-30 DIAGNOSIS — Z9049 Acquired absence of other specified parts of digestive tract: Secondary | ICD-10-CM

## 2022-06-30 DIAGNOSIS — I120 Hypertensive chronic kidney disease with stage 5 chronic kidney disease or end stage renal disease: Principal | ICD-10-CM | POA: Diagnosis present

## 2022-06-30 DIAGNOSIS — J45909 Unspecified asthma, uncomplicated: Secondary | ICD-10-CM | POA: Diagnosis present

## 2022-06-30 DIAGNOSIS — E8770 Fluid overload, unspecified: Secondary | ICD-10-CM | POA: Diagnosis present

## 2022-06-30 DIAGNOSIS — T50996A Underdosing of other drugs, medicaments and biological substances, initial encounter: Secondary | ICD-10-CM | POA: Diagnosis present

## 2022-06-30 DIAGNOSIS — J189 Pneumonia, unspecified organism: Secondary | ICD-10-CM | POA: Diagnosis present

## 2022-06-30 DIAGNOSIS — N2581 Secondary hyperparathyroidism of renal origin: Secondary | ICD-10-CM | POA: Diagnosis present

## 2022-06-30 DIAGNOSIS — Z992 Dependence on renal dialysis: Secondary | ICD-10-CM

## 2022-06-30 DIAGNOSIS — J90 Pleural effusion, not elsewhere classified: Secondary | ICD-10-CM | POA: Diagnosis present

## 2022-06-30 DIAGNOSIS — Z87441 Personal history of nephrotic syndrome: Secondary | ICD-10-CM

## 2022-06-30 DIAGNOSIS — G9349 Other encephalopathy: Secondary | ICD-10-CM | POA: Diagnosis present

## 2022-06-30 DIAGNOSIS — F319 Bipolar disorder, unspecified: Secondary | ICD-10-CM | POA: Diagnosis present

## 2022-06-30 DIAGNOSIS — Z8249 Family history of ischemic heart disease and other diseases of the circulatory system: Secondary | ICD-10-CM

## 2022-06-30 DIAGNOSIS — N186 End stage renal disease: Secondary | ICD-10-CM | POA: Diagnosis present

## 2022-06-30 DIAGNOSIS — E8779 Other fluid overload: Secondary | ICD-10-CM

## 2022-06-30 DIAGNOSIS — E669 Obesity, unspecified: Secondary | ICD-10-CM | POA: Diagnosis present

## 2022-06-30 DIAGNOSIS — G4700 Insomnia, unspecified: Secondary | ICD-10-CM | POA: Diagnosis present

## 2022-06-30 DIAGNOSIS — Z91158 Patient's noncompliance with renal dialysis for other reason: Secondary | ICD-10-CM

## 2022-06-30 DIAGNOSIS — F4310 Post-traumatic stress disorder, unspecified: Secondary | ICD-10-CM | POA: Diagnosis present

## 2022-06-30 DIAGNOSIS — F17210 Nicotine dependence, cigarettes, uncomplicated: Secondary | ICD-10-CM | POA: Diagnosis present

## 2022-06-30 DIAGNOSIS — Z888 Allergy status to other drugs, medicaments and biological substances status: Secondary | ICD-10-CM

## 2022-06-30 DIAGNOSIS — Z79899 Other long term (current) drug therapy: Secondary | ICD-10-CM

## 2022-06-30 DIAGNOSIS — G8929 Other chronic pain: Secondary | ICD-10-CM | POA: Diagnosis present

## 2022-06-30 DIAGNOSIS — Z6831 Body mass index (BMI) 31.0-31.9, adult: Secondary | ICD-10-CM

## 2022-06-30 DIAGNOSIS — Z79891 Long term (current) use of opiate analgesic: Secondary | ICD-10-CM

## 2022-06-30 DIAGNOSIS — Z91148 Patient's other noncompliance with medication regimen for other reason: Secondary | ICD-10-CM

## 2022-06-30 LAB — CBC WITH DIFFERENTIAL/PLATELET
Abs Immature Granulocytes: 0.05 10*3/uL (ref 0.00–0.07)
Basophils Absolute: 0 10*3/uL (ref 0.0–0.1)
Basophils Relative: 0 %
Eosinophils Absolute: 0.2 10*3/uL (ref 0.0–0.5)
Eosinophils Relative: 2 %
HCT: 24.6 % — ABNORMAL LOW (ref 36.0–46.0)
Hemoglobin: 7.4 g/dL — ABNORMAL LOW (ref 12.0–15.0)
Immature Granulocytes: 1 %
Lymphocytes Relative: 14 %
Lymphs Abs: 1.4 10*3/uL (ref 0.7–4.0)
MCH: 30.1 pg (ref 26.0–34.0)
MCHC: 30.1 g/dL (ref 30.0–36.0)
MCV: 100 fL (ref 80.0–100.0)
Monocytes Absolute: 0.6 10*3/uL (ref 0.1–1.0)
Monocytes Relative: 6 %
Neutro Abs: 8 10*3/uL — ABNORMAL HIGH (ref 1.7–7.7)
Neutrophils Relative %: 77 %
Platelets: 222 10*3/uL (ref 150–400)
RBC: 2.46 MIL/uL — ABNORMAL LOW (ref 3.87–5.11)
RDW: 17.4 % — ABNORMAL HIGH (ref 11.5–15.5)
WBC: 10.3 10*3/uL (ref 4.0–10.5)
nRBC: 0 % (ref 0.0–0.2)

## 2022-06-30 LAB — BASIC METABOLIC PANEL
Anion gap: 19 — ABNORMAL HIGH (ref 5–15)
BUN: 101 mg/dL — ABNORMAL HIGH (ref 6–20)
CO2: 17 mmol/L — ABNORMAL LOW (ref 22–32)
Calcium: 8.4 mg/dL — ABNORMAL LOW (ref 8.9–10.3)
Chloride: 101 mmol/L (ref 98–111)
Creatinine, Ser: 23.58 mg/dL — ABNORMAL HIGH (ref 0.44–1.00)
GFR, Estimated: 2 mL/min — ABNORMAL LOW (ref >60)
Glucose, Bld: 87 mg/dL (ref 70–99)
Potassium: 4.4 mmol/L (ref 3.5–5.1)
Sodium: 137 mmol/L (ref 135–145)

## 2022-06-30 MED ORDER — TORSEMIDE 20 MG PO TABS
100.0000 mg | ORAL_TABLET | Freq: Every morning | ORAL | Status: DC
  Administered 2022-07-01 – 2022-07-02 (×2): 100 mg via ORAL
  Filled 2022-06-30 (×2): qty 5

## 2022-06-30 MED ORDER — POLYETHYLENE GLYCOL 3350 17 G PO PACK: 17.0000 g | PACK | Freq: Every day | ORAL | Status: DC | PRN

## 2022-06-30 MED ORDER — METOPROLOL TARTRATE 25 MG PO TABS
12.5000 mg | ORAL_TABLET | Freq: Two times a day (BID) | ORAL | Status: DC
  Administered 2022-06-30 – 2022-07-01 (×2): 12.5 mg via ORAL
  Filled 2022-06-30 (×2): qty 1

## 2022-06-30 MED ORDER — HEPARIN SODIUM (PORCINE) 5000 UNIT/ML IJ SOLN
5000.0000 [IU] | Freq: Three times a day (TID) | INTRAMUSCULAR | Status: DC
  Administered 2022-06-30 – 2022-07-02 (×5): 5000 [IU] via SUBCUTANEOUS
  Filled 2022-06-30 (×5): qty 1

## 2022-06-30 MED ORDER — LOSARTAN POTASSIUM 50 MG PO TABS
50.0000 mg | ORAL_TABLET | Freq: Every day | ORAL | Status: DC
  Administered 2022-06-30 – 2022-07-02 (×3): 50 mg via ORAL
  Filled 2022-06-30 (×3): qty 1

## 2022-06-30 MED ORDER — DOXYCYCLINE HYCLATE 100 MG PO TABS
100.0000 mg | ORAL_TABLET | Freq: Two times a day (BID) | ORAL | Status: DC
  Administered 2022-06-30 – 2022-07-02 (×4): 100 mg via ORAL
  Filled 2022-06-30 (×4): qty 1

## 2022-06-30 MED ORDER — GABAPENTIN 100 MG PO CAPS
100.0000 mg | ORAL_CAPSULE | Freq: Two times a day (BID) | ORAL | Status: DC
Start: 2022-06-30 — End: 2022-07-03
  Administered 2022-06-30 – 2022-07-02 (×4): 100 mg via ORAL
  Filled 2022-06-30 (×4): qty 1

## 2022-06-30 MED ORDER — ACETAMINOPHEN 325 MG PO TABS
650.0000 mg | ORAL_TABLET | Freq: Four times a day (QID) | ORAL | Status: DC | PRN
  Administered 2022-07-01: 650 mg via ORAL
  Filled 2022-06-30: qty 2

## 2022-06-30 MED ORDER — PROCHLORPERAZINE EDISYLATE 10 MG/2ML IJ SOLN
10.0000 mg | Freq: Four times a day (QID) | INTRAMUSCULAR | Status: AC | PRN
  Administered 2022-07-01 – 2022-07-02 (×2): 10 mg via INTRAVENOUS
  Filled 2022-06-30 (×2): qty 2

## 2022-06-30 MED ORDER — OXYCODONE HCL 5 MG PO TABS
5.0000 mg | ORAL_TABLET | Freq: Four times a day (QID) | ORAL | Status: AC | PRN
  Administered 2022-06-30 – 2022-07-01 (×2): 5 mg via ORAL
  Filled 2022-06-30 (×2): qty 1

## 2022-06-30 MED ORDER — SEVELAMER CARBONATE 800 MG PO TABS
1600.0000 mg | ORAL_TABLET | Freq: Three times a day (TID) | ORAL | Status: DC
  Administered 2022-07-01 – 2022-07-02 (×5): 1600 mg via ORAL
  Filled 2022-06-30 (×5): qty 2

## 2022-06-30 NOTE — ED Triage Notes (Signed)
Pt in via [facility] for initial callout of "uncontrolled rectal bleeding", [place] by [place]. Pt also with hx of dialysis, T-Th-S, missed trx today and last was on Saturday, also med noncompliant. C/o sob and BLE pain and swelling, denies any cp presently.   160/100 HR 105  100% RA
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
## 2022-06-30 NOTE — ED Provider Notes (Signed)
[facility] Provider Note   CSN: [id number] Arrival date & time: 06/30/22  1914     History  Chief Complaint  Patient presents with   Shortness of Breath    Natasha Long is a 26 y.o. female.  Patient here with continued shortness of breath and need for dialysis.  She left AMA yesterday from the hospital.  She states she is homeless.  She has history of end-stage renal disease.  Does not have anywhere to live, no where to do dialysis.  History of hypertension, substance abuse as well.  She denies any substance abuse today yesterday.  Nothing makes it worse or better.  She denies any fevers or chills.  Denies any chest pain.  Generally feeling weak.  She states that she is willing to stay for further care.  The history is provided by the patient.       Home Medications Prior to Admission medications   Medication Sig Start Date End Date Taking? Authorizing Provider  acetaminophen (TYLENOL) 500 MG tablet Take 2 tablets (1,000 mg total) by mouth every 8 (eight) hours. Patient taking differently: Take 1,000 mg by mouth every 8 (eight) hours as needed for mild pain or headache. 02/16/22   [name], MD  acyclovir ointment (ZOVIRAX) 5 % Apply topically 4 (four) times daily. 05/07/22   Dwyane Dee, MD  ARIPiprazole (ABILIFY) 15 MG tablet Take 0.5 tablets (7.5 mg total) by mouth daily. 12/27/21   Regalado, Belkys A, MD  B Complex-C-Folic Acid (RENAL VITAMIN) 0.8 MG TABS Take 1 tablet by mouth daily. 10/29/21   [provider]  calcitRIOL (ROCALTROL) 0.25 MCG capsule Take 0.25 mcg by mouth Every Tuesday,Thursday,and Saturday with dialysis. 09/25/20   [provider]  calcium acetate (PHOSLO) 667 MG capsule Take 667 mg by mouth 3 (three) times daily with meals. 07/03/21   [provider]  Darbepoetin Alfa (ARANESP) 150 MCG/0.3ML SOSY injection Inject 0.3 mLs (150 mcg total) into the vein every Saturday with hemodialysis.  02/21/22   [name], MD  diclofenac Sodium (VOLTAREN) 1 % GEL Apply 4 g topically 4 (four) times daily as needed (Pain to L wrist and R knee). 02/16/22   [name], MD  diphenhydrAMINE-zinc acetate (BENADRYL) cream Apply 1 application. topically 2 (two) times daily as needed for itching. 02/16/22   [name], MD  doxycycline (VIBRA-TABS) 100 MG tablet Take 100 mg by mouth See admin instructions. Bid  x 5 days 05/17/22   [provider]  gabapentin (NEURONTIN) 600 MG tablet Take 300 mg by mouth 3 (three) times daily as needed (for neuropathy pain). 03/31/22   [provider]  hydrocerin (EUCERIN) CREA Apply 1 application. topically 2 (two) times daily. Patient taking differently: Apply 1 application  topically 2 (two) times daily as needed (dry skin/itching). 02/16/22   [name], MD  hydrOXYzine (VISTARIL) 25 MG capsule Take 1 capsule (25 mg total) by mouth every 8 (eight) hours as needed for anxiety. 03/25/22   [name], MD  lidocaine-prilocaine (EMLA) cream Apply 1 application topically daily as needed (port site access on dialysis days (Tuesday's, Thursday's, and Saturday's). 03/06/21   [provider]  losartan (COZAAR) 50 MG tablet Take 50 mg by mouth daily. 12/11/21   [provider]  methocarbamol (ROBAXIN) 500 MG tablet Take 2 tablets (1,000 mg total) by mouth at bedtime. Patient not taking: Reported on 05/15/2022 02/16/22   [name], MD  mineral oil-hydrophilic petrolatum (AQUAPHOR) ointment Apply 1  Application topically 3 (three) times daily as needed for dry skin or irritation. 05/17/22   [provider]  omeprazole (PRILOSEC) 20 MG capsule TAKE 1 CAPSULE BY MOUTH EVERY DAY Patient taking differently: Take 20 mg by mouth daily as needed (for heartburn or reflux). 04/01/22   [name], MD  oxyCODONE (ROXICODONE) 5 MG immediate release tablet Take 1 tablet (5 mg total) by mouth every 4 (four) hours as needed for  severe pain. 05/21/22   Maudie Flakes, MD  polyethylene glycol (MIRALAX / GLYCOLAX) 17 g packet Take 17 g by mouth 2 (two) times daily. Patient not taking: Reported on 05/15/2022 02/16/22   [name], MD  ramelteon (ROZEREM) 8 MG tablet Take 1 tablet (8 mg total) by mouth at bedtime. Patient not taking: Reported on 05/15/2022 02/16/22   [name], MD  senna-docusate (SENOKOT-S) 8.6-50 MG tablet Take 2 tablets by mouth 2 (two) times daily. Patient not taking: Reported on 05/15/2022 02/16/22   [name], MD  sevelamer carbonate (RENVELA) 800 MG tablet Take 2 tablets (1,600 mg total) by mouth 3 (three) times daily with meals. Patient not taking: Reported on 05/15/2022 02/16/22   [name], MD  torsemide (DEMADEX) 100 MG tablet Take 100 mg by mouth every morning. 03/31/22   [provider]  traZODone (DESYREL) 100 MG tablet Take 100 mg by mouth at bedtime.    [provider]      Allergies    Prozac [fluoxetine hcl], Wellbutrin [bupropion], and Prednisone    Review of Systems   Review of Systems  Physical Exam Updated Vital Signs BP (!) 189/115 (BP Location: Right Arm)   Pulse (!) 106   Temp 98.6 F (37 C) (Oral)   Resp (!) 28   Wt 92.5 kg   LMP  (LMP Unknown) Comment: pt states since beginning dialysis, periods have become irregular  SpO2 100%   BMI 31.01 kg/m  Physical Exam Vitals and nursing note reviewed.  Constitutional:      General: She is not in acute distress.    Appearance: She is well-developed.  HENT:     Head: Normocephalic and atraumatic.  Eyes:     Conjunctiva/sclera: Conjunctivae normal.  Cardiovascular:     Rate and Rhythm: Regular rhythm. Tachycardia present.     Pulses: Normal pulses.     Heart sounds: No murmur heard. Pulmonary:     Effort: Pulmonary effort is normal. No respiratory distress.     Breath sounds: Rales present.  Abdominal:     Palpations: Abdomen is soft.     Tenderness: There is no abdominal tenderness.   Musculoskeletal:        General: No swelling.     Cervical back: Normal range of motion and neck supple.  Skin:    General: Skin is warm and dry.     Capillary Refill: Capillary refill takes less than 2 seconds.  Neurological:     General: No focal deficit present.     Mental Status: She is alert.  Psychiatric:        Mood and Affect: Mood normal.     ED Results / Procedures / Treatments   Labs (all labs ordered are listed, but only abnormal results are displayed) Labs Reviewed  CBC WITH DIFFERENTIAL/PLATELET - Abnormal; Notable for the following components:      Result Value   RBC 2.46 (*)    Hemoglobin 7.4 (*)    HCT 24.6 (*)    RDW 17.4 (*)  Neutro Abs 8.0 (*)    All other components within normal limits  BASIC METABOLIC PANEL - Abnormal; Notable for the following components:   CO2 17 (*)    BUN 101 (*)    Creatinine, Ser 23.58 (*)    Calcium 8.4 (*)    GFR, Estimated 2 (*)    Anion gap 19 (*)    All other components within normal limits    EKG EKG Interpretation  Date/Time:  Tuesday June 30 2022 19:20:07 EDT Ventricular Rate:  101 PR Interval:  136 QRS Duration: 86 QT Interval:  365 QTC Calculation: 474 R Axis:   51 Text Interpretation: Sinus tachycardia Confirmed by Lennice Sites ([id number]) on 06/30/2022 7:24:45 PM  Radiology DG Chest Portable 1 View  Result Date: 06/30/2022 CLINICAL DATA:  Dyspnea EXAM: PORTABLE CHEST 1 VIEW COMPARISON:  Radiographs 06/28/2022 FINDINGS: Decreased hazy bilateral airspace and interstitial opacities suggestive improving edema. Remainder unchanged. Layering small bilateral pleural effusions and associated atelectasis. Enlarged cardiomediastinal silhouette. No acute osseous abnormality. IMPRESSION: 1. Improving airspace opacities since 06/28/2022. 2. Small bilateral pleural effusions. 3. Enlargement of the cardiac silhouette suggestive of pericardial effusion. Electronically Signed   By: Placido Sou M.D.   On: 06/30/2022 19:41    VAS [place] LOWER EXTREMITY VENOUS (DVT)  Result Date: 06/30/2022  Lower Venous DVT Study Patient Name:  AKAYSHA COBERN  Date of Exam:   06/29/2022 Medical Rec #: [id number]          Accession #:    [id number] Date of Birth: Jun 24, 1996          Patient Gender: F Patient Age:   48 years Exam Location:  [name] Procedure:      VAS [place] LOWER EXTREMITY VENOUS (DVT) Referring Phys: Yehuda Savannah OGBATA --------------------------------------------------------------------------------  Indications: Bilateral calf pain.  Risk Factors: Crack cocaine use. Comparison Study: 05/15/2022 negative right lower extremity venous duplex Performing Technologist: Maudry Mayhew MHA, RDMS, RVT, RDCS  Examination Guidelines: A complete evaluation includes B-mode imaging, spectral Doppler, color Doppler, and power Doppler as needed of all accessible portions of each vessel. Bilateral testing is considered an integral part of a complete examination. Limited examinations for reoccurring indications may be performed as noted. The reflux portion of the exam is performed with the patient in reverse Trendelenburg.  +---------+---------------+---------+-----------+----------+--------------+ RIGHT    CompressibilityPhasicitySpontaneityPropertiesThrombus Aging +---------+---------------+---------+-----------+----------+--------------+ CFV      Full           Yes      Yes                                 +---------+---------------+---------+-----------+----------+--------------+ SFJ      Full                                                        +---------+---------------+---------+-----------+----------+--------------+ FV Prox  Full                                                        +---------+---------------+---------+-----------+----------+--------------+ FV Mid   Full                                                        +---------+---------------+---------+-----------+----------+--------------+  FV DistalFull                                                        +---------+---------------+---------+-----------+----------+--------------+ PFV      Full                                                        +---------+---------------+---------+-----------+----------+--------------+ POP      Full           Yes      Yes                                 +---------+---------------+---------+-----------+----------+--------------+ PTV      Full                                                        +---------+---------------+---------+-----------+----------+--------------+ PERO     Full                                                        +---------+---------------+---------+-----------+----------+--------------+   +---------+---------------+---------+-----------+----------+--------------+ LEFT     CompressibilityPhasicitySpontaneityPropertiesThrombus Aging +---------+---------------+---------+-----------+----------+--------------+ CFV      Full           Yes      Yes                                 +---------+---------------+---------+-----------+----------+--------------+ SFJ      Full                                                        +---------+---------------+---------+-----------+----------+--------------+ FV Prox  Full                                                        +---------+---------------+---------+-----------+----------+--------------+ FV Mid   Full                                                        +---------+---------------+---------+-----------+----------+--------------+ FV DistalFull                                                        +---------+---------------+---------+-----------+----------+--------------+   PFV      Full                                                        +---------+---------------+---------+-----------+----------+--------------+ POP      Full           Yes      Yes                                  +---------+---------------+---------+-----------+----------+--------------+ PTV      Full                                                        +---------+---------------+---------+-----------+----------+--------------+ PERO     Full                                                        +---------+---------------+---------+-----------+----------+--------------+     Summary: RIGHT: - There is no evidence of deep vein thrombosis in the lower extremity.  - No cystic structure found in the popliteal fossa.  LEFT: - There is no evidence of deep vein thrombosis in the lower extremity.  - A cystic structure is found in the popliteal fossa.  *See table(s) above for measurements and observations. Electronically signed by Orlie Pollen on 06/30/2022 at 12:47:35 PM.    Final     Procedures Procedures    Medications Ordered in ED Medications - No data to display  ED Course/ Medical Decision Making/ A&P                           Medical Decision Making Amount and/or Complexity of Data Reviewed Labs: ordered. Radiology: ordered.  Risk Decision regarding hospitalization.   Christyl Shiel is here with shortness of breath.  History of end-stage renal disease on hemodialysis.  History of hypertension, polysubstance abuse.  Patient left AMA from the hospital yesterday.  She was being treated for possible pneumonia and getting dialysis.  She had missed over a month worth of dialysis sessions.  She is homeless with no housing arrangements/medical care arrangements.  This seems per my review of notes to be something that was being worked out while she was in the hospital.  She denies any fevers or chills.  She is got some rales on exam.  She is hypertensive.  She does not have any of her medications.  She cannot afford her meds.  Overall we will get CBC, BMP, chest x-ray and have her readmitted for further care.  EKG shows sinus rhythm per my review and interpretation of  labs.  Per my review and interpretation of x-ray, improvement in possible pneumonia.  No significant hyperkalemia.  Potassium is 4.4.  Her creatinine continues to be elevated at 23.  Hemoglobin is stable at 7.4.  Chest x-ray however still shows pleural effusion and general volume overload.  Ultimately will readmit for further nephrology care as well as  social work need.  Patient continues to be hypertensive.  Will need home medications ordered and optimize as well as social work need to help maintain her care outpatient.  This chart was dictated using voice recognition software.  Despite best efforts to proofread,  errors can occur which can change the documentation meaning.         Final Clinical Impression(s) / ED Diagnoses Final diagnoses:  Hypervolemia, unspecified hypervolemia type  Community acquired pneumonia, unspecified laterality    Rx / DC Orders ED Discharge Orders     None         Lennice Sites, DO 06/30/22 2119

## 2022-07-01 DIAGNOSIS — E8779 Other fluid overload: Secondary | ICD-10-CM

## 2022-07-01 LAB — RENAL FUNCTION PANEL
Albumin: 2.6 g/dL — ABNORMAL LOW (ref 3.5–5.0)
Anion gap: 18 — ABNORMAL HIGH (ref 5–15)
BUN: 102 mg/dL — ABNORMAL HIGH (ref 6–20)
CO2: 20 mmol/L — ABNORMAL LOW (ref 22–32)
Calcium: 8.6 mg/dL — ABNORMAL LOW (ref 8.9–10.3)
Chloride: 99 mmol/L (ref 98–111)
Creatinine, Ser: 23.95 mg/dL — ABNORMAL HIGH (ref 0.44–1.00)
GFR, Estimated: 2 mL/min — ABNORMAL LOW (ref >60)
Glucose, Bld: 97 mg/dL (ref 70–99)
Phosphorus: >30 mg/dL — ABNORMAL HIGH (ref 2.5–4.6)
Potassium: 4.9 mmol/L (ref 3.5–5.1)
Sodium: 137 mmol/L (ref 135–145)

## 2022-07-01 LAB — CBC
HCT: 23.1 % — ABNORMAL LOW (ref 36.0–46.0)
Hemoglobin: 7.1 g/dL — ABNORMAL LOW (ref 12.0–15.0)
MCH: 29.5 pg (ref 26.0–34.0)
MCHC: 30.7 g/dL (ref 30.0–36.0)
MCV: 95.9 fL (ref 80.0–100.0)
Platelets: 222 10*3/uL (ref 150–400)
RBC: 2.41 MIL/uL — ABNORMAL LOW (ref 3.87–5.11)
RDW: 17.2 % — ABNORMAL HIGH (ref 11.5–15.5)
WBC: 10.5 10*3/uL (ref 4.0–10.5)
nRBC: 0 % (ref 0.0–0.2)

## 2022-07-01 LAB — HEPATITIS B CORE ANTIBODY, TOTAL: Hep B Core Total Ab: NONREACTIVE

## 2022-07-01 LAB — HEPATITIS B SURFACE ANTIGEN: Hepatitis B Surface Ag: NONREACTIVE

## 2022-07-01 LAB — HEPATITIS B SURFACE ANTIBODY,QUALITATIVE: Hep B S Ab: REACTIVE — AB

## 2022-07-01 LAB — HEPATITIS C ANTIBODY: HCV Ab: NONREACTIVE

## 2022-07-01 LAB — MAGNESIUM: Magnesium: 2.4 mg/dL (ref 1.7–2.4)

## 2022-07-01 MED ORDER — HYDROMORPHONE HCL 1 MG/ML IJ SOLN
0.5000 mg | INTRAMUSCULAR | Status: DC | PRN
  Administered 2022-07-01 – 2022-07-02 (×7): 0.5 mg via INTRAVENOUS
  Filled 2022-07-01 (×7): qty 1

## 2022-07-01 MED ORDER — HYDRALAZINE HCL 20 MG/ML IJ SOLN
10.0000 mg | Freq: Four times a day (QID) | INTRAMUSCULAR | Status: DC | PRN
  Administered 2022-07-01: 10 mg via INTRAVENOUS
  Filled 2022-07-01: qty 1

## 2022-07-01 MED ORDER — CHLORHEXIDINE GLUCONATE CLOTH 2 % EX PADS
6.0000 | MEDICATED_PAD | Freq: Every day | CUTANEOUS | Status: DC
Start: 2022-07-02 — End: 2022-07-02
  Administered 2022-07-02: 6 via TOPICAL

## 2022-07-01 MED ORDER — CARVEDILOL 3.125 MG PO TABS
3.1250 mg | ORAL_TABLET | Freq: Two times a day (BID) | ORAL | Status: DC
  Administered 2022-07-02 (×2): 3.125 mg via ORAL
  Filled 2022-07-01 (×2): qty 1

## 2022-07-01 NOTE — ED Notes (Signed)
Pt refusing to allow this nurse to get her BP asking for me to take it later.
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
## 2022-07-01 NOTE — Progress Notes (Signed)
NEW ADMISSION NOTE New Admission Note:   Arrival Method: A&O X 4  Mental Orientation: stretcher Telemetry:5M12 Assessment: Completed Skin: intact  IV: RFA Pain:7/10 Tubes:nnone Safety Measures: Safety Fall Prevention Plan has been given, discussed and signed Admission: Completed [facility] Orientation: Patient has been orientated to the room, unit and staff.  Family:none  Orders have been reviewed and implemented. Will continue to monitor the patient. Call light has been placed within reach and bed alarm has been activated.   Keyonda Bickle S Langley Ingalls, RN

## 2022-07-01 NOTE — ED Notes (Signed)
Restricted extremity armband applied to left arm due to fistula.

## 2022-07-01 NOTE — Progress Notes (Addendum)
[facility] Progress Note   Subjective:   Patient left [facility] AMA on Monday 06/29/22 from admission for uremia/volume overload 2/2 non compliance with dialysis.   Patient seen and examined at bedside.  States "I should not have left AMA."  Admits to SOB, edema and pain in her lower extremities.  Also admits to intermittent confusion and nausea.  Denies CP, abdominal pain, vomiting and diarrhea.  CXR with improving air space opacities.  Pertinent labs include SCR 24, BUN 102, K 4.9, phos>30 and Hgb 7.1.  Objective Vitals:   07/01/22 0755 07/01/22 1125 07/01/22 1259 07/01/22 1307  BP:  (!) 158/106 (!) 174/107   Pulse:  96 100   Resp:  20 18   Temp: 97.8 F (36.6 C) 97.6 F (36.4 C) 98.2 F (36.8 C)   TempSrc: Oral Axillary Oral   SpO2:  99% 99%   Weight:    97.1 kg   Physical Exam General:WDWN female in NAD Heart:RRR, no mrg Lungs:mostly CTAB, nml WOb on RA Abdomen:soft, NTND Extremities: trace LE edema, +tenderness to palpation Dialysis Access: LU AVF+b/t   Filed Weights   06/30/22 1928 07/01/22 1307  Weight: 92.5 kg 97.1 kg   No intake or output data in the 24 hours ending 07/01/22 1349  Additional Objective Labs: Basic Metabolic Panel: Recent Labs  Lab 06/28/22 0601 06/29/22 0356 06/30/22 2003 07/01/22 0422  NA 138 138 137 137  K 3.5 4.2 4.4 4.9  CL 101 99 101 99  CO2 19* 19* 17* 20*  GLUCOSE 104* 89 87 97  BUN 78* 85* 101* 102*  CREATININE 21.62* 22.32* 23.58* 23.95*  CALCIUM 8.3* 8.3* 8.4* 8.6*  PHOS 10.1* 10.6*  --  >30.0*   Liver Function Tests: Recent Labs  Lab 06/26/22 1930 06/28/22 0601 06/29/22 0356 07/01/22 0422  AST 59*  --   --   --   ALT 47*  --   --   --   ALKPHOS 107  --   --   --   BILITOT 0.4  --   --   --   PROT 6.8  --   --   --   ALBUMIN 3.1* 2.6* 2.5* 2.6*    CBC: Recent Labs  Lab 06/27/22 0131 06/28/22 0601 06/29/22 0356 06/30/22 2003 07/01/22 0422  WBC 9.3 8.8 9.5 10.3 10.5  NEUTROABS  --  6.4 7.9* 8.0*   --   HGB 7.8* 7.6* 7.8* 7.4* 7.1*  HCT 23.9* 23.3* 24.2* 24.6* 23.1*  MCV 91.9 91.7 93.8 100.0 95.9  PLT 259 246 272 222 222    Studies/Results: DG Chest Portable 1 View  Result Date: 06/30/2022 CLINICAL DATA:  Dyspnea EXAM: PORTABLE CHEST 1 VIEW COMPARISON:  Radiographs 06/28/2022 FINDINGS: Decreased hazy bilateral airspace and interstitial opacities suggestive improving edema. Remainder unchanged. Layering small bilateral pleural effusions and associated atelectasis. Enlarged cardiomediastinal silhouette. No acute osseous abnormality. IMPRESSION: 1. Improving airspace opacities since 06/28/2022. 2. Small bilateral pleural effusions. 3. Enlargement of the cardiac silhouette suggestive of pericardial effusion. Electronically Signed   By: Placido Sou M.D.   On: 06/30/2022 19:41    Medications:   carvedilol  3.125 mg Oral BID WC   doxycycline  100 mg Oral Q12H   gabapentin  100 mg Oral BID   heparin  5,000 Units Subcutaneous Q8H   losartan  50 mg Oral Daily   sevelamer carbonate  1,600 mg Oral TID WC   torsemide  100 mg Oral q morning    Dialysis Orders:  TTS - [facility] 3hrs47min, BFR 350, DFR Auto 1.5,  EDW 93kg, 2K/ 2.5Ca Heparin 5000 units bolus with HD Calcitriol 1.3mcg PO qHD-last dose 06/13/22  Assessment/Plan: 1. Volume overload - 2/2 non compliance. CXR with improving opacities. On RA. UF as tolerated. Plan for HD today and again tomorrow. 2. Uremia - 2/2 non compliance. BUN 102. SCr 24. Plan for HD today.  3. ESRD - on TTS.  Plan for HD today and again tomorrow per regular schedule.  3. Anemia of CKD- Hgb 7.1. No recent ESA. Will give with HD.  4. Secondary hyperparathyroidism - Calcium in goal.  Phos extremely high.  Restart binders and VDRA.  5. HTN - Blood pressure elevated, resume home meds.  UF as tolerated.  6. Nutrition - Renal diet w/fluid restrictions.  7. Non compliance - continue to educated on importance of compliance with HD and medications.   8. Bipolar d/o 9. Neuropathy/chronic pain - per PMD 10. Recent PNA - on doxycycline 100mg  BID x 3 day 11. Homelessness 12. Moderate pericardial effusion - per PMD  Jen Mow, PA-C [facility] 07/01/2022,1:49 PM  LOS: 1 day   Nephrology attending; Seen and examined. Chart reviewed and agree with above.  Just signed out AMA, homelessness, p/w leg pain and missed OP HD. Uremic and abnormal labs for missing dialysis.  Plan for HD today and tomorrow. SW/renal navigator;s help for shelter/OP HD unit. She wants to move to [place] area.   Katheran James,  CKA

## 2022-07-01 NOTE — Progress Notes (Signed)
PROGRESS NOTE    Natasha Long  KGM:[id number] DOB: 1996-04-02 DOA: 06/30/2022 PCP: [name], MD   Brief Narrative:     Natasha Long is a 26 y.o. female with medical history significant for ESRD secondary to nephrotic syndrome on HD TTS with noncompliance, moderate pericardial effusion related to uremia and volume overload, hypertension, asthma, septic arthritis, history of syphilis/gonorrhea, major depressive disorder, polysubstance abuse, who left AMA yesterday after being admitted for volume overload after missing more than 9 sessions of hemodialysis, community-acquired pneumonia.  She presents with the same complaints of shortness of breath and volume overload.    Assessment & Plan:   Principal Problem:   Volume overload  Assessment and Plan:   Volume overload in the setting of missing hemodialysis session History of noncompliance with medical management and hemodialysis Left AMA the day prior to presentation. Volume status and electrolytes managed with hemodialysis, nephrology aware   Acute uremic encephalopathy Somnolent, arousable to voices, weak appearing Resume hemodialysis per nephrology   High anion gap metabolic acidosis in the setting of ESRD with noncompliance with hemodialysis Management of electrolytes with hemodialysis. Planning to resume hemodialysis.   ESRD on HD TTS with noncompliance Management per nephrology   Moderate pericardial effusion Suspect related to uremia and volume overload No dyspnea and chest pain noted, likely chronic   Uncontrolled hypertension and tachycardia Likely exacerbated by volume overload BP is not at goal, elevated Restarted home torsemide and home losartan Added Lopressor 12.5 mg twice daily Closely monitor vital signs.   Polyneuropathy Chronic pain, on chronic opioids Resume home gabapentin at lower doses 100 mg twice daily to avoid sedation On oxycodone as needed prior to admission  Avoid sedative  agents Bilateral lower extremity DVT study negative   Obesity BMI 31 Recommend weight loss outpatient with regular physical activity and healthy dieting.   Recently treated pneumonia, POA Interrupted course of treatment Doxycycline 100 mg twice daily x3 days to complete course of treatment   Hyperphosphatemia in the setting of ESRD, medication noncompliance and noncompliance with hemodialysis. Resume home regimen Management per nephrology   Social issues The patient is homeless During her previous admission TOC was working hard to find hemodialysis chair for the patient. TOC consulted to assist with DC planning   Polysubstance abuse Obtain UDS if able Last toxicology done on 05/03/2022 showed positivity for cocaine and THC.     DVT prophylaxis: Heparin Code Status: Full Family Communication: None at bedside Disposition Plan:  Status is: Inpatient Remains inpatient appropriate because: Need for ongoing dialysis and IV medications   Consultants:  Nephrology  Procedures:  None  Antimicrobials:  Anti-infectives (From admission, onward)    Start     Dose/Rate Route Frequency Ordered Stop   06/30/22 2145  doxycycline (VIBRA-TABS) tablet 100 mg        100 mg Oral Every 12 hours 06/30/22 2137 07/03/22 2159        Subjective: Patient seen and evaluated today with complaints of leg pain, but otherwise no other acute concerns noted.  She is quite sleepy.  Objective: Vitals:   07/01/22 0400 07/01/22 0639 07/01/22 0700 07/01/22 0755  BP: (!) 177/118 (!) 154/91 (!) 162/95   Pulse:  96 92   Resp: (!) 30 (!) 22 20   Temp:    97.8 F (36.6 C)  TempSrc:    Oral  SpO2:  98% 98%   Weight:       No intake or output data in the 24 hours ending 07/01/22  [place]   06/30/22 1928  Weight: 92.5 kg    Examination:  General exam: Appears somnolent, but arousable Respiratory system: Clear to auscultation. Respiratory effort normal. Cardiovascular system: S1 & S2  heard, RRR.  Gastrointestinal system: Abdomen is soft Central nervous system: Somnolent Extremities: No edema Skin: No significant lesions noted Psychiatry: Flat affect.    Data Reviewed: I have personally reviewed following labs and imaging studies  CBC: Recent Labs  Lab 06/27/22 0131 06/28/22 0601 06/29/22 0356 06/30/22 2003 07/01/22 0422  WBC 9.3 8.8 9.5 10.3 10.5  NEUTROABS  --  6.4 7.9* 8.0*  --   HGB 7.8* 7.6* 7.8* 7.4* 7.1*  HCT 23.9* 23.3* 24.2* 24.6* 23.1*  MCV 91.9 91.7 93.8 100.0 95.9  PLT 259 246 272 222 417   Basic Metabolic Panel: Recent Labs  Lab 06/26/22 1930 06/27/22 0131 06/28/22 0601 06/29/22 0356 06/30/22 2003 07/01/22 0422  NA 140 141 138 138 137 137  K 5.6* 4.9 3.5 4.2 4.4 4.9  CL 98 102 101 99 101 99  CO2 13* 11* 19* 19* 17* 20*  GLUCOSE 132* 127* 104* 89 87 97  BUN 123* 124* 78* 85* 101* 102*  CREATININE 32.88* 32.89* 21.62* 22.32* 23.58* 23.95*  CALCIUM 9.3 9.0 8.3* 8.3* 8.4* 8.6*  MG 2.8*  --   --   --   --  2.4  PHOS  --   --  10.1* 10.6*  --  >30.0*   GFR: Estimated Creatinine Clearance: 4.2 mL/min (A) (by C-G formula based on SCr of 23.95 mg/dL (H)). Liver Function Tests: Recent Labs  Lab 06/26/22 1930 06/28/22 0601 06/29/22 0356 07/01/22 0422  AST 59*  --   --   --   ALT 47*  --   --   --   ALKPHOS 107  --   --   --   BILITOT 0.4  --   --   --   PROT 6.8  --   --   --   ALBUMIN 3.1* 2.6* 2.5* 2.6*   No results for input(s): "LIPASE", "AMYLASE" in the last 168 hours. No results for input(s): "AMMONIA" in the last 168 hours. Coagulation Profile: No results for input(s): "INR", "PROTIME" in the last 168 hours. Cardiac Enzymes: No results for input(s): "CKTOTAL", "CKMB", "CKMBINDEX", "TROPONINI" in the last 168 hours. BNP (last 3 results) No results for input(s): "PROBNP" in the last 8760 hours. HbA1C: No results for input(s): "HGBA1C" in the last 72 hours. CBG: No results for input(s): "GLUCAP" in the last 168  hours. Lipid Profile: No results for input(s): "CHOL", "HDL", "LDLCALC", "TRIG", "CHOLHDL", "LDLDIRECT" in the last 72 hours. Thyroid Function Tests: No results for input(s): "TSH", "T4TOTAL", "FREET4", "T3FREE", "THYROIDAB" in the last 72 hours. Anemia Panel: No results for input(s): "VITAMINB12", "FOLATE", "FERRITIN", "TIBC", "IRON", "RETICCTPCT" in the last 72 hours. Sepsis Labs: No results for input(s): "PROCALCITON", "LATICACIDVEN" in the last 168 hours.  Recent Results (from the past 240 hour(s))  MRSA Next Gen by PCR, Nasal     Status: Abnormal   Collection Time: 06/29/22 11:30 AM   Specimen: Nasal Mucosa; Nasal Swab  Result Value Ref Range Status   MRSA by PCR Next Gen DETECTED (A) NOT DETECTED Final    Comment: RESULT CALLED TO, READ BACK BY AND VERIFIED WITH: RN Roque Lias [phone] @1339  FH  (NOTE) The GeneXpert MRSA Assay (FDA approved for NASAL specimens only), is one component of a comprehensive MRSA colonization surveillance program. It is not intended  to diagnose MRSA infection nor to guide or monitor treatment for MRSA infections. Test performance is not FDA approved in patients less than 27 years old. Performed at [facility], [place] [address] [place], [place] [address]          Radiology Studies: DG Chest Portable 1 View  Result Date: 06/30/2022 CLINICAL DATA:  Dyspnea EXAM: PORTABLE CHEST 1 VIEW COMPARISON:  Radiographs 06/28/2022 FINDINGS: Decreased hazy bilateral airspace and interstitial opacities suggestive improving edema. Remainder unchanged. Layering small bilateral pleural effusions and associated atelectasis. Enlarged cardiomediastinal silhouette. No acute osseous abnormality. IMPRESSION: 1. Improving airspace opacities since 06/28/2022. 2. Small bilateral pleural effusions. 3. Enlargement of the cardiac silhouette suggestive of pericardial effusion. Electronically Signed   By: Placido Sou M.D.   On: 06/30/2022 19:41   VAS [place] LOWER EXTREMITY  VENOUS (DVT)  Result Date: 06/30/2022  Lower Venous DVT Study Patient Name:  RANELL FINELLI  Date of Exam:   06/29/2022 Medical Rec #: [id number]          Accession #:    [id number] Date of Birth: Dec 28, 1995          Patient Gender: F Patient Age:   52 years Exam Location:  [facility] Procedure:      VAS [place] LOWER EXTREMITY VENOUS (DVT) Referring Phys: Yehuda Savannah OGBATA --------------------------------------------------------------------------------  Indications: Bilateral calf pain.  Risk Factors: Crack cocaine use. Comparison Study: 05/15/2022 negative right lower extremity venous duplex Performing Technologist: Maudry Mayhew MHA, RDMS, RVT, RDCS  Examination Guidelines: A complete evaluation includes B-mode imaging, spectral Doppler, color Doppler, and power Doppler as needed of all accessible portions of each vessel. Bilateral testing is considered an integral part of a complete examination. Limited examinations for reoccurring indications may be performed as noted. The reflux portion of the exam is performed with the patient in reverse Trendelenburg.  +---------+---------------+---------+-----------+----------+--------------+ RIGHT    CompressibilityPhasicitySpontaneityPropertiesThrombus Aging +---------+---------------+---------+-----------+----------+--------------+ CFV      Full           Yes      Yes                                 +---------+---------------+---------+-----------+----------+--------------+ SFJ      Full                                                        +---------+---------------+---------+-----------+----------+--------------+ FV Prox  Full                                                        +---------+---------------+---------+-----------+----------+--------------+ FV Mid   Full                                                        +---------+---------------+---------+-----------+----------+--------------+ FV DistalFull                                                         +---------+---------------+---------+-----------+----------+--------------+  PFV      Full                                                        +---------+---------------+---------+-----------+----------+--------------+ POP      Full           Yes      Yes                                 +---------+---------------+---------+-----------+----------+--------------+ PTV      Full                                                        +---------+---------------+---------+-----------+----------+--------------+ PERO     Full                                                        +---------+---------------+---------+-----------+----------+--------------+   +---------+---------------+---------+-----------+----------+--------------+ LEFT     CompressibilityPhasicitySpontaneityPropertiesThrombus Aging +---------+---------------+---------+-----------+----------+--------------+ CFV      Full           Yes      Yes                                 +---------+---------------+---------+-----------+----------+--------------+ SFJ      Full                                                        +---------+---------------+---------+-----------+----------+--------------+ FV Prox  Full                                                        +---------+---------------+---------+-----------+----------+--------------+ FV Mid   Full                                                        +---------+---------------+---------+-----------+----------+--------------+ FV DistalFull                                                        +---------+---------------+---------+-----------+----------+--------------+ PFV      Full                                                        +---------+---------------+---------+-----------+----------+--------------+  POP      Full           Yes      Yes                                  +---------+---------------+---------+-----------+----------+--------------+ PTV      Full                                                        +---------+---------------+---------+-----------+----------+--------------+ PERO     Full                                                        +---------+---------------+---------+-----------+----------+--------------+     Summary: RIGHT: - There is no evidence of deep vein thrombosis in the lower extremity.  - No cystic structure found in the popliteal fossa.  LEFT: - There is no evidence of deep vein thrombosis in the lower extremity.  - A cystic structure is found in the popliteal fossa.  *See table(s) above for measurements and observations. Electronically signed by Orlie Pollen on 06/30/2022 at 12:47:35 PM.    Final         Scheduled Meds:  doxycycline  100 mg Oral Q12H   gabapentin  100 mg Oral BID   heparin  5,000 Units Subcutaneous Q8H   losartan  50 mg Oral Daily   metoprolol tartrate  12.5 mg Oral BID   sevelamer carbonate  1,600 mg Oral TID WC   torsemide  100 mg Oral q morning     LOS: 1 day    Time spent: 35 minutes    Quinterius Gaida Darleen Crocker, DO [facility]  If 7PM-7AM, please contact night-coverage [email or web address] 07/01/2022, 10:55 AM

## 2022-07-01 NOTE — Progress Notes (Signed)
[facility] paged that HGB 7.1 and she is scheduled for HD tonight. Arthor Captain LPN

## 2022-07-02 DIAGNOSIS — E8779 Other fluid overload: Secondary | ICD-10-CM | POA: Diagnosis not present

## 2022-07-02 LAB — BASIC METABOLIC PANEL
Anion gap: 22 — ABNORMAL HIGH (ref 5–15)
BUN: 114 mg/dL — ABNORMAL HIGH (ref 6–20)
CO2: 16 mmol/L — ABNORMAL LOW (ref 22–32)
Calcium: 8.7 mg/dL — ABNORMAL LOW (ref 8.9–10.3)
Chloride: 97 mmol/L — ABNORMAL LOW (ref 98–111)
Creatinine, Ser: 23.58 mg/dL — ABNORMAL HIGH (ref 0.44–1.00)
GFR, Estimated: 2 mL/min — ABNORMAL LOW (ref >60)
Glucose, Bld: 90 mg/dL (ref 70–99)
Potassium: 5.1 mmol/L (ref 3.5–5.1)
Sodium: 135 mmol/L (ref 135–145)

## 2022-07-02 LAB — CBC
HCT: 22.2 % — ABNORMAL LOW (ref 36.0–46.0)
Hemoglobin: 7 g/dL — ABNORMAL LOW (ref 12.0–15.0)
MCH: 30 pg (ref 26.0–34.0)
MCHC: 31.5 g/dL (ref 30.0–36.0)
MCV: 95.3 fL (ref 80.0–100.0)
Platelets: 232 10*3/uL (ref 150–400)
RBC: 2.33 MIL/uL — ABNORMAL LOW (ref 3.87–5.11)
RDW: 17.1 % — ABNORMAL HIGH (ref 11.5–15.5)
WBC: 11.3 10*3/uL — ABNORMAL HIGH (ref 4.0–10.5)
nRBC: 0 % (ref 0.0–0.2)

## 2022-07-02 LAB — HEPATITIS B SURFACE ANTIBODY, QUANTITATIVE: Hep B S AB Quant (Post): >1000 m[IU]/mL (ref >9.9)

## 2022-07-02 LAB — PREPARE RBC (CROSSMATCH)

## 2022-07-02 LAB — MAGNESIUM: Magnesium: 2.5 mg/dL — ABNORMAL HIGH (ref 1.7–2.4)

## 2022-07-02 MED ORDER — PROSOURCE PLUS PO LIQD
30.0000 mL | Freq: Three times a day (TID) | ORAL | Status: DC
  Administered 2022-07-02: 30 mL via ORAL
  Filled 2022-07-02: qty 30

## 2022-07-02 MED ORDER — SODIUM CHLORIDE 0.9% IV SOLUTION: Freq: Once | INTRAVENOUS | Status: DC

## 2022-07-02 MED ORDER — RENA-VITE PO TABS
1.0000 | ORAL_TABLET | Freq: Every day | ORAL | Status: DC
Start: 2022-07-02 — End: 2022-07-03
  Administered 2022-07-02: 1 via ORAL
  Filled 2022-07-02: qty 1

## 2022-07-02 MED ORDER — CHLORHEXIDINE GLUCONATE CLOTH 2 % EX PADS: 6.0000 | MEDICATED_PAD | Freq: Every day | CUTANEOUS | Status: DC

## 2022-07-02 MED ORDER — DARBEPOETIN ALFA 100 MCG/0.5ML IJ SOSY
100.0000 ug | PREFILLED_SYRINGE | INTRAMUSCULAR | Status: DC
  Filled 2022-07-02: qty 0.5

## 2022-07-02 NOTE — Plan of Care (Signed)
  Problem: Clinical Measurements: Goal: Respiratory complications will improve Outcome: Progressing Goal: Cardiovascular complication will be avoided Outcome: Progressing   Problem: Activity: Goal: Risk for activity intolerance will decrease Outcome: Progressing   Problem: Nutrition: Goal: Adequate nutrition will be maintained Outcome: Progressing   Problem: Coping: Goal: Level of anxiety will decrease Outcome: Progressing   Problem: Elimination: Goal: Will not experience complications related to bowel motility Outcome: Progressing Goal: Will not experience complications related to urinary retention Outcome: Progressing   

## 2022-07-02 NOTE — Progress Notes (Signed)
PROGRESS NOTE    Jorryn Hershberger  ZOX:[id number] DOB: 25-Sep-1996 DOA: 06/30/2022 PCP: [name], MD   Brief Narrative:     Madyson Lukach is a 26 y.o. female with medical history significant for ESRD secondary to nephrotic syndrome on HD TTS with noncompliance, moderate pericardial effusion related to uremia and volume overload, hypertension, asthma, septic arthritis, history of syphilis/gonorrhea, major depressive disorder, polysubstance abuse, who left AMA yesterday after being admitted for volume overload after missing more than 9 sessions of hemodialysis, community-acquired pneumonia.  She presents with the same complaints of shortness of breath and volume overload.    Assessment & Plan:   Principal Problem:   Volume overload  Assessment and Plan:   Volume overload in the setting of missing hemodialysis session History of noncompliance with medical management and hemodialysis Left AMA the day prior to presentation. Volume status and electrolytes managed with hemodialysis, nephrology aware   Acute uremic encephalopathy Somnolent, arousable to voices, weak appearing Resume hemodialysis per nephrology   High anion gap metabolic acidosis in the setting of ESRD with noncompliance with hemodialysis Management of electrolytes with hemodialysis. Planning to resume hemodialysis.   ESRD on HD TTS with noncompliance Management per nephrology, hemodialysis today   Worsening anemia of CKD Plan for 1 unit PRBC transfusion in a.m. with repeat hemodialysis   Moderate pericardial effusion Suspect related to uremia and volume overload No dyspnea and chest pain noted, likely chronic   Uncontrolled hypertension and tachycardia Likely exacerbated by volume overload BP is not at goal, elevated Restarted home torsemide and home losartan Added Lopressor 12.5 mg twice daily Closely monitor vital signs.   Polyneuropathy Chronic pain, on chronic opioids Resume home gabapentin  at lower doses 100 mg twice daily to avoid sedation On oxycodone as needed prior to admission  Avoid sedative agents Bilateral lower extremity DVT study negative   Obesity BMI 31 Recommend weight loss outpatient with regular physical activity and healthy dieting.   Recently treated pneumonia, POA Interrupted course of treatment Doxycycline 100 mg twice daily x3 days to complete course of treatment   Hyperphosphatemia in the setting of ESRD, medication noncompliance and noncompliance with hemodialysis. Resume home regimen Management per nephrology   Social issues The patient is homeless During her previous admission TOC was working hard to find hemodialysis chair for the patient. TOC consulted to assist with DC planning   Polysubstance abuse Obtain UDS if able Last toxicology done on 05/03/2022 showed positivity for cocaine and THC.       DVT prophylaxis: Heparin Code Status: Full Family Communication: None at bedside Disposition Plan:  Status is: Inpatient Remains inpatient appropriate because: Need for ongoing dialysis and IV medications     Consultants:  Nephrology   Procedures:  None   Antimicrobials:  Anti-infectives (From admission, onward)    Start     Dose/Rate Route Frequency Ordered Stop   06/30/22 2145  doxycycline (VIBRA-TABS) tablet 100 mg        100 mg Oral Every 12 hours 06/30/22 2137 07/03/22 2159       Subjective: Patient seen and evaluated today with ongoing pain to lower extremities, but with no other acute overnight events noted.  Currently undergoing hemodialysis.  Shortness of breath is improved.  Objective: Vitals:   07/02/22 0729 07/02/22 0733 07/02/22 0854 07/02/22 0941  BP: (!) 187/100  (!) 184/101 (!) 167/65  Pulse: (!) 102  (!) 109 100  Resp:   18 19  Temp: 98.2 F (36.8 C)  98.4 F (  36.9 C) 97.7 F (36.5 C)  TempSrc: Oral   Axillary  SpO2: (!) 19%  91% 97%  Weight:  92.9 kg      Intake/Output Summary (Last 24 hours) at  07/02/2022 1034 Last data filed at 07/02/2022 0718 Gross per 24 hour  Intake 418 ml  Output 3 ml  Net 415 ml   Filed Weights   06/30/22 1928 07/01/22 1307 07/02/22 0733  Weight: 92.5 kg 97.1 kg 92.9 kg    Examination:  General exam: Appears calm and comfortable  Respiratory system: Clear to auscultation. Respiratory effort normal.  2 L nasal cannula Cardiovascular system: S1 & S2 heard, RRR.  Gastrointestinal system: Abdomen is soft Central nervous system: Alert and awake Extremities: Trace bilateral edema Skin: No significant lesions noted Psychiatry: Flat affect.    Data Reviewed: I have personally reviewed following labs and imaging studies  CBC: Recent Labs  Lab 06/28/22 0601 06/29/22 0356 06/30/22 2003 07/01/22 0422 07/02/22 0330  WBC 8.8 9.5 10.3 10.5 11.3*  NEUTROABS 6.4 7.9* 8.0*  --   --   HGB 7.6* 7.8* 7.4* 7.1* 7.0*  HCT 23.3* 24.2* 24.6* 23.1* 22.2*  MCV 91.7 93.8 100.0 95.9 95.3  PLT 246 272 222 222 062   Basic Metabolic Panel: Recent Labs  Lab 06/26/22 1930 06/27/22 0131 06/28/22 0601 06/29/22 0356 06/30/22 2003 07/01/22 0422 07/02/22 0330  NA 140   < > 138 138 137 137 135  K 5.6*   < > 3.5 4.2 4.4 4.9 5.1  CL 98   < > 101 99 101 99 97*  CO2 13*   < > 19* 19* 17* 20* 16*  GLUCOSE 132*   < > 104* 89 87 97 90  BUN 123*   < > 78* 85* 101* 102* 114*  CREATININE 32.88*   < > 21.62* 22.32* 23.58* 23.95* 23.58*  CALCIUM 9.3   < > 8.3* 8.3* 8.4* 8.6* 8.7*  MG 2.8*  --   --   --   --  2.4 2.5*  PHOS  --   --  10.1* 10.6*  --  >30.0*  --    < > = values in this interval not displayed.   GFR: Estimated Creatinine Clearance: 4.2 mL/min (A) (by C-G formula based on SCr of 23.58 mg/dL (H)). Liver Function Tests: Recent Labs  Lab 06/26/22 1930 06/28/22 0601 06/29/22 0356 07/01/22 0422  AST 59*  --   --   --   ALT 47*  --   --   --   ALKPHOS 107  --   --   --   BILITOT 0.4  --   --   --   PROT 6.8  --   --   --   ALBUMIN 3.1* 2.6* 2.5* 2.6*    No results for input(s): "LIPASE", "AMYLASE" in the last 168 hours. No results for input(s): "AMMONIA" in the last 168 hours. Coagulation Profile: No results for input(s): "INR", "PROTIME" in the last 168 hours. Cardiac Enzymes: No results for input(s): "CKTOTAL", "CKMB", "CKMBINDEX", "TROPONINI" in the last 168 hours. BNP (last 3 results) No results for input(s): "PROBNP" in the last 8760 hours. HbA1C: No results for input(s): "HGBA1C" in the last 72 hours. CBG: No results for input(s): "GLUCAP" in the last 168 hours. Lipid Profile: No results for input(s): "CHOL", "HDL", "LDLCALC", "TRIG", "CHOLHDL", "LDLDIRECT" in the last 72 hours. Thyroid Function Tests: No results for input(s): "TSH", "T4TOTAL", "FREET4", "T3FREE", "THYROIDAB" in the last 72 hours. Anemia Panel:  No results for input(s): "VITAMINB12", "FOLATE", "FERRITIN", "TIBC", "IRON", "RETICCTPCT" in the last 72 hours. Sepsis Labs: No results for input(s): "PROCALCITON", "LATICACIDVEN" in the last 168 hours.  Recent Results (from the past 240 hour(s))  MRSA Next Gen by PCR, Nasal     Status: Abnormal   Collection Time: 06/29/22 11:30 AM   Specimen: Nasal Mucosa; Nasal Swab  Result Value Ref Range Status   MRSA by PCR Next Gen DETECTED (A) NOT DETECTED Final    Comment: RESULT CALLED TO, READ BACK BY AND VERIFIED WITH: RN Roque Lias [phone] @1339  FH  (NOTE) The GeneXpert MRSA Assay (FDA approved for NASAL specimens only), is one component of a comprehensive MRSA colonization surveillance program. It is not intended to diagnose MRSA infection nor to guide or monitor treatment for MRSA infections. Test performance is not FDA approved in patients less than 71 years old. Performed at [facility], [place] [address] [place], [place] [address]          Radiology Studies: DG Chest Portable 1 View  Result Date: 06/30/2022 CLINICAL DATA:  Dyspnea EXAM: PORTABLE CHEST 1 VIEW COMPARISON:  Radiographs 06/28/2022  FINDINGS: Decreased hazy bilateral airspace and interstitial opacities suggestive improving edema. Remainder unchanged. Layering small bilateral pleural effusions and associated atelectasis. Enlarged cardiomediastinal silhouette. No acute osseous abnormality. IMPRESSION: 1. Improving airspace opacities since 06/28/2022. 2. Small bilateral pleural effusions. 3. Enlargement of the cardiac silhouette suggestive of pericardial effusion. Electronically Signed   By: Placido Sou M.D.   On: 06/30/2022 19:41        Scheduled Meds:  sodium chloride   Intravenous Once   carvedilol  3.125 mg Oral BID WC   [START ON 07/03/2022] Chlorhexidine Gluconate Cloth  6 each Topical Q0600   [START ON 07/03/2022] darbepoetin (ARANESP) injection - DIALYSIS  100 mcg Intravenous Q Fri-HD   doxycycline  100 mg Oral Q12H   gabapentin  100 mg Oral BID   heparin  5,000 Units Subcutaneous Q8H   losartan  50 mg Oral Daily   sevelamer carbonate  1,600 mg Oral TID WC   torsemide  100 mg Oral q morning     LOS: 2 days    Time spent: 35 minutes    Seger Jani Darleen Crocker, DO [facility]  If 7PM-7AM, please contact night-coverage [email or web address] 07/02/2022, 10:34 AM

## 2022-07-02 NOTE — Plan of Care (Signed)

## 2022-07-02 NOTE — Progress Notes (Signed)
)   Dilaudid 0.5 ml was given to Charge RN to take to HD because HD RN stated that she could not get medication from [facility] because she was alone. Arthor Captain LPN

## 2022-07-02 NOTE — Progress Notes (Signed)
Patient stated that she had order $200 of merchandise from [employer] and someone had signed for it.  She had told me she was leaving to go find it I told let me call MD to see if allowed. MD paged and was told me not allowed to leave the floor. She then began cursing stating that we didn't care about her money when charge RN asked why she didn't wait until she got home that triggered an angry response that she didn't have a home and called her a b*tch. She then went to her room. Arthor Captain LPN

## 2022-07-02 NOTE — Progress Notes (Signed)
Initial Nutrition Assessment  DOCUMENTATION CODES:   Not applicable  INTERVENTION:   Add Renal MVI daily  Continue Renal diet for now  Add 30 ml ProSource Plus TID, each supplement provides 100 kcals and 15 grams protein.    NUTRITION DIAGNOSIS:   Inadequate oral intake related to acute illness as evidenced by meal completion < 25%.  GOAL:   Patient will meet greater than or equal to 90% of their needs  MONITOR:   PO intake, Supplement acceptance, Labs, Weight trends  REASON FOR ASSESSMENT:   Malnutrition Screening Tool    ASSESSMENT:   26 yo female admitted with acute volume overload, acute uremic encephalopathy, metabolic acidosis related to missed HD, uncontrolled HTN. Pt left AMA on 7/31. Noted hx of polysubstance abuse, pt is currently homeless. PMH includes ESRD on HD, HTN, non compliance, bipolar disorder, chronic pain  Pt sleeping on visit today, able to arouse but remains sleepy. Per RN, pt did not sleep well as she had iHD during the night  Pt sleeping through lunch on visit, RD woke up pt to see if she would eat, stated she would be then feel back asleep.   Unable to get diet and weight history from patient.   RN reports pt has lost good amount of weight since the beginning of the year. RD noted loose skin in upper extremities but pt declined physical exam so unable to examine further. Current wt 92.9 kg, admit wt 97.1 kg. Noted wt 110 kg in Jan 2023. EDW 93 kg.   Phos very high due to noncompliance, noted binder therapy ordered  Noted pt plans to go to [place], [place] at discharge and has been accepted at [facility].   Labs: phosphorus >30.0 (H), BUN 114, Creatinine 23.58 Meds: renvela, aranesp  NUTRITION - FOCUSED PHYSICAL EXAM:  RD attempted to perform exam but pt declined after RD started. Will attempt on follow-up   Diet Order:   Diet Order             Diet renal with fluid restriction Fluid restriction: 1200 mL Fluid; Room  service appropriate? Yes; Fluid consistency: Thin  Diet effective now                   EDUCATION NEEDS:   Not appropriate for education at this time  Skin:  Skin Assessment: Reviewed RN Assessment  Last BM:  8/3  Height:   Ht Readings from Last 1 Encounters:  06/27/22 5\' 8"  (1.727 m)    Weight:   Wt Readings from Last 1 Encounters:  07/02/22 92.9 kg     BMI:  Body mass index is 31.14 kg/m.  Estimated Nutritional Needs:   Kcal:  1900-2100 kcals  Protein:  100-115 g  Fluid:  1000 mL plus UOP   Kerman Passey MS, RDN, LDN, CNSC Registered Dietitian 3 Clinical Nutrition RD Pager and On-Call Pager Number Located in [place]

## 2022-07-02 NOTE — Progress Notes (Addendum)
[facility] Progress Note   Subjective:   Patient seen and examined at bedside.  Reports she tolerated dialysis well.  Breathing is improved.  Continues to have pain in LE.  Denies CP, SOB, abdominal pain and n/v/d.   Objective Vitals:   07/02/22 0716 07/02/22 0729 07/02/22 0733 07/02/22 0854  BP: (!) 192/109 (!) 187/100  (!) 184/101  Pulse: (!) 103 (!) 102  (!) 109  Resp: 20   18  Temp:  98.2 F (36.8 C)  98.4 F (36.9 C)  TempSrc:  Oral    SpO2: 99% (!) 19%  91%  Weight:   92.9 kg    Physical Exam General:well appearing female in NAD Heart:RRR, no mrg Lungs:CTAB, nml WOB on 2L O2 via [place] Abdomen:soft, NTND Extremities:trace LE edema Dialysis Access: LU AVF +b/t   Filed Weights   06/30/22 1928 07/01/22 1307 07/02/22 0733  Weight: 92.5 kg 97.1 kg 92.9 kg    Intake/Output Summary (Last 24 hours) at 07/02/2022 0918 Last data filed at 07/02/2022 0350 Gross per 24 hour  Intake 418 ml  Output 3 ml  Net 415 ml    Additional Objective Labs: Basic Metabolic Panel: Recent Labs  Lab 06/28/22 0601 06/29/22 0356 06/30/22 2003 07/01/22 0422 07/02/22 0330  NA 138 138 137 137 135  K 3.5 4.2 4.4 4.9 5.1  CL 101 99 101 99 97*  CO2 19* 19* 17* 20* 16*  GLUCOSE 104* 89 87 97 90  BUN 78* 85* 101* 102* 114*  CREATININE 21.62* 22.32* 23.58* 23.95* 23.58*  CALCIUM 8.3* 8.3* 8.4* 8.6* 8.7*  PHOS 10.1* 10.6*  --  >30.0*  --    Liver Function Tests: Recent Labs  Lab 06/26/22 1930 06/28/22 0601 06/29/22 0356 07/01/22 0422  AST 59*  --   --   --   ALT 47*  --   --   --   ALKPHOS 107  --   --   --   BILITOT 0.4  --   --   --   PROT 6.8  --   --   --   ALBUMIN 3.1* 2.6* 2.5* 2.6*   CBC: Recent Labs  Lab 06/28/22 0601 06/29/22 0356 06/30/22 2003 07/01/22 0422 07/02/22 0330  WBC 8.8 9.5 10.3 10.5 11.3*  NEUTROABS 6.4 7.9* 8.0*  --   --   HGB 7.6* 7.8* 7.4* 7.1* 7.0*  HCT 23.3* 24.2* 24.6* 23.1* 22.2*  MCV 91.7 93.8 100.0 95.9 95.3  PLT 246 272 222 222  232    Studies/Results: DG Chest Portable 1 View  Result Date: 06/30/2022 CLINICAL DATA:  Dyspnea EXAM: PORTABLE CHEST 1 VIEW COMPARISON:  Radiographs 06/28/2022 FINDINGS: Decreased hazy bilateral airspace and interstitial opacities suggestive improving edema. Remainder unchanged. Layering small bilateral pleural effusions and associated atelectasis. Enlarged cardiomediastinal silhouette. No acute osseous abnormality. IMPRESSION: 1. Improving airspace opacities since 06/28/2022. 2. Small bilateral pleural effusions. 3. Enlargement of the cardiac silhouette suggestive of pericardial effusion. Electronically Signed   By: Placido Sou M.D.   On: 06/30/2022 19:41    Medications:   carvedilol  3.125 mg Oral BID WC   Chlorhexidine Gluconate Cloth  6 each Topical Q0600   doxycycline  100 mg Oral Q12H   gabapentin  100 mg Oral BID   heparin  5,000 Units Subcutaneous Q8H   losartan  50 mg Oral Daily   sevelamer carbonate  1,600 mg Oral TID WC   torsemide  100 mg Oral q morning    Dialysis Orders:  TTS - [facility] 3hrs94min, BFR 350, DFR Auto 1.5,  EDW 93kg, 2K/ 2.5Ca Heparin 5000 units bolus with HD Calcitriol 1.81mcg PO qHD-last dose 06/13/22   Assessment/Plan: 1. Volume overload - 2/2 non compliance. CXR with improving opacities. UF as tolerated. Plan for extra HD tomorrow. 2. Uremia - 2/2 non compliance. BUN 114. SCr 24 pre HD. HD this AM.  Plan for extra HD tomorrow. 3. ESRD - on TTS.  HD today, plan for extra HD tomorrow and Sat per regular schedule. 3. Anemia of CKD- Hgb 7.0. No recent ESA. Will give with HD. Plan to transfuse 1 uit pRBC with HD tomorrow. 4. Secondary hyperparathyroidism - Calcium in goal.  Phos extremely high.  Restart binders and VDRA.  5. HTN - Blood pressure elevated, resume home meds.  UF as tolerated.  6. Nutrition - Renal diet w/fluid restrictions.  7. Non compliance - continue to educated on importance of compliance with HD and medications.  8.  Bipolar d/o 9. Neuropathy/chronic pain - per PMD 10. Recent PNA - on doxycycline 100mg  BID x 3 day 11. Homelessness 12. Moderate pericardial effusion - per PMD  Jen Mow, PA-C [facility] 07/02/2022,9:18 AM  LOS: 2 days   Nephrology attending: The patient was seen and examined.  Chart reviewed.  I agree with above. Completed dialysis earlier this morning.  The labs from today were before the dialysis.  Plan for another HD tomorrow.  Resume phosphorus binders, Aranesp.  Difficult and challenging discharge situation as patient is homelessness and likes to go to [place] area.  Discussed with renal navigator.  Katheran James, CKA

## 2022-07-02 NOTE — Progress Notes (Signed)
Pt wheeled up to nursing station and stated,"she ordered 200 dollars worth of items from [facility] and told them to drop it off at the hospital and let her know when they get here but the pt stated instead someone signed for or order and it was not her that signed.The patient was going off the floor to find the person,the doctor was called and the doctor stated she could not go off the floor pt got mad and said she was going off the floor .explained to the pt she should wait to go home to order items so she can sign for herself and track her orders but pt became upset and said she dont have an home and started cursing and yelling in the hallway. Security called and came up to talk to the pt.

## 2022-07-02 NOTE — Progress Notes (Signed)
Pt well known to navigator. Met with pt at bedside with RN present. Pt confirms plans to go to [place], [place] at d/c. Pt advised of acceptance at [facility] on MWF 11:30 chair time. Pt can start on Monday and will need to arrive at 10:45 to complete paperwork prior to treatment. Pt provided information sheet with clinic address, phone number,and schedule noted. Update provided to pt's RN. Pt requesting information on homeless shelter in [place]. Will make CSW aware of pt's request. [facility] info placed on pt's AVS as well. Will advise [facility] admissions Lovena Le) of pt's d/c date once known.   Melven Sartorius Renal Navigator [phone]

## 2022-07-03 DIAGNOSIS — E8779 Other fluid overload: Secondary | ICD-10-CM | POA: Diagnosis not present

## 2022-07-03 NOTE — Discharge Summary (Signed)
Physician Discharge Summary  Natasha Long IOX:[id number] DOB: 10/09/1996 DOA: 06/30/2022  PCP: [name], MD  Admit date: 06/30/2022  Discharge date: 07/03/2022  Admitted From:Homeless   Disposition:  [place] DISCHARGE   Discharge Condition:Stable  CODE STATUS: Full  Brief/Interim Summary:  Natasha Long is a 26 y.o. female with medical history significant for ESRD secondary to nephrotic syndrome on HD TTS with noncompliance, moderate pericardial effusion related to uremia and volume overload, hypertension, asthma, septic arthritis, history of syphilis/gonorrhea, major depressive disorder, polysubstance abuse, who left AMA the day prior to this admission after being admitted for volume overload after missing more than 9 sessions of hemodialysis, community-acquired pneumonia.  She presents with the same complaints of shortness of breath and volume overload.  She underwent a session of hemodialysis on 8/3 and in the early morning hours of 8/4 was adamant about going home [place].  She insisted that she was not going to stay and be monitored any further.  She had full decision-making capacity as determined by the overnight physician and understood the risks of doing so, but decided to leave anyways, presumably so that she could use cocaine.  She will likely return in the very near future when she becomes symptomatic again.  No other acute events noted throughout this brief admission.  Discharge Diagnoses:  Principal Problem:   Volume overload  Principal discharge diagnosis: Acute uremic encephalopathy, volume overload, and metabolic acidosis in the setting of noncompliance with hemodialysis.    Follow-up [facility]. Go on 07/06/2022.   Why: Schedule is Monday/Wednesday/Friday with 11:30 chair time.  For first appointment, please arrive at 10:45 to complete paperwork prior to treatment. Contact information: [place], [place] [address] [phone]               Allergies  Allergen Reactions   Prozac [Fluoxetine Hcl] Anxiety and Other (See Comments)    Caused panic attacks   Wellbutrin [Bupropion] Anxiety and Other (See Comments)    Caused panic attacks   Prednisone Other (See Comments)    Pt stated this med caused pancreatitis    Consultations: Nephrology   Procedures/Studies: DG Chest Portable 1 View  Result Date: 06/30/2022 CLINICAL DATA:  Dyspnea EXAM: PORTABLE CHEST 1 VIEW COMPARISON:  Radiographs 06/28/2022 FINDINGS: Decreased hazy bilateral airspace and interstitial opacities suggestive improving edema. Remainder unchanged. Layering small bilateral pleural effusions and associated atelectasis. Enlarged cardiomediastinal silhouette. No acute osseous abnormality. IMPRESSION: 1. Improving airspace opacities since 06/28/2022. 2. Small bilateral pleural effusions. 3. Enlargement of the cardiac silhouette suggestive of pericardial effusion. Electronically Signed   By: Placido Sou M.D.   On: 06/30/2022 19:41   VAS [place] LOWER EXTREMITY VENOUS (DVT)  Result Date: 06/30/2022  Lower Venous DVT Study Patient Name:  Natasha Long  Date of Exam:   06/29/2022 Medical Rec #: [id number]          Accession #:    [id number] Date of Birth: 1996-09-07          Patient Gender: F Patient Age:   61 years Exam Location:  [facility] Procedure:      VAS [place] LOWER EXTREMITY VENOUS (DVT) Referring Phys: Yehuda Savannah OGBATA --------------------------------------------------------------------------------  Indications: Bilateral calf pain.  Risk Factors: Crack cocaine use. Comparison Study: 05/15/2022 negative right lower extremity venous duplex Performing Technologist: Maudry Mayhew MHA, RDMS, RVT, RDCS  Examination Guidelines: A complete evaluation includes B-mode imaging, spectral Doppler, color Doppler, and power Doppler as needed  of all accessible portions of each vessel. Bilateral testing is  considered an integral part of a complete examination. Limited examinations for reoccurring indications may be performed as noted. The reflux portion of the exam is performed with the patient in reverse Trendelenburg.  +---------+---------------+---------+-----------+----------+--------------+ RIGHT    CompressibilityPhasicitySpontaneityPropertiesThrombus Aging +---------+---------------+---------+-----------+----------+--------------+ CFV      Full           Yes      Yes                                 +---------+---------------+---------+-----------+----------+--------------+ SFJ      Full                                                        +---------+---------------+---------+-----------+----------+--------------+ FV Prox  Full                                                        +---------+---------------+---------+-----------+----------+--------------+ FV Mid   Full                                                        +---------+---------------+---------+-----------+----------+--------------+ FV DistalFull                                                        +---------+---------------+---------+-----------+----------+--------------+ PFV      Full                                                        +---------+---------------+---------+-----------+----------+--------------+ POP      Full           Yes      Yes                                 +---------+---------------+---------+-----------+----------+--------------+ PTV      Full                                                        +---------+---------------+---------+-----------+----------+--------------+ PERO     Full                                                        +---------+---------------+---------+-----------+----------+--------------+   +---------+---------------+---------+-----------+----------+--------------+ LEFT  CompressibilityPhasicitySpontaneityPropertiesThrombus Aging +---------+---------------+---------+-----------+----------+--------------+ CFV      Full           Yes      Yes                                 +---------+---------------+---------+-----------+----------+--------------+ SFJ      Full                                                        +---------+---------------+---------+-----------+----------+--------------+ FV Prox  Full                                                        +---------+---------------+---------+-----------+----------+--------------+ FV Mid   Full                                                        +---------+---------------+---------+-----------+----------+--------------+ FV DistalFull                                                        +---------+---------------+---------+-----------+----------+--------------+ PFV      Full                                                        +---------+---------------+---------+-----------+----------+--------------+ POP      Full           Yes      Yes                                 +---------+---------------+---------+-----------+----------+--------------+ PTV      Full                                                        +---------+---------------+---------+-----------+----------+--------------+ PERO     Full                                                        +---------+---------------+---------+-----------+----------+--------------+     Summary: RIGHT: - There is no evidence of deep vein thrombosis in the lower extremity.  - No cystic structure found in the popliteal fossa.  LEFT: - There is no evidence of deep vein thrombosis in the lower extremity.  - A cystic structure is found in the  popliteal fossa.  *See table(s) above for measurements and observations. Electronically signed by Orlie Pollen on 06/30/2022 at 12:47:35 PM.    Final    DG CHEST PORT 1  VIEW  Result Date: 06/28/2022 CLINICAL DATA:  Shortness of breath EXAM: PORTABLE CHEST 1 VIEW COMPARISON:  06/26/2022 FINDINGS: Persistent enlargement of the cardiopericardial silhouette. Pulmonary vascular congestion. Low lung volumes. Interval development of patchy bibasilar airspace opacities, right greater than left. Probable small bilateral pleural effusions. No pneumothorax. IMPRESSION: 1. Interval development of patchy bibasilar airspace opacities, right greater than left, concerning for pneumonia versus asymmetric edema. 2. Probable small bilateral pleural effusions. 3. Persistent enlargement of the cardiopericardial silhouette suggestive of pericardial effusion. Electronically Signed   By: Davina Poke D.O.   On: 06/28/2022 09:52   DG Chest 2 View  Result Date: 06/26/2022 CLINICAL DATA:  Shortness of breath. EXAM: CHEST - 2 VIEW COMPARISON:  Two-view chest x-ray 05/22/2022 FINDINGS: Heart is enlarged. Mild pulmonary vascular congestion present. Asymmetric right lower lobe airspace disease is present. A right pleural effusion is present with fluid into the major fissure. IMPRESSION: 1. Asymmetric right lower lobe airspace disease and effusion concerning for pneumonia. 2. Cardiomegaly and mild pulmonary vascular congestion. Electronically Signed   By: San Morelle M.D.   On: 06/26/2022 20:11     Discharge Exam: Vitals:   07/02/22 1631 07/02/22 2117  BP: (!) 161/65 (!) 151/87  Pulse: 100 98  Resp: 18   Temp: 98.2 F (36.8 C) 98.5 F (36.9 C)  SpO2: (!) 89% 97%   Vitals:   07/02/22 0854 07/02/22 0941 07/02/22 1631 07/02/22 2117  BP: (!) 184/101 (!) 167/65 (!) 161/65 (!) 151/87  Pulse: (!) 109 100 100 98  Resp: 18 19 18    Temp: 98.4 F (36.9 C) 97.7 F (36.5 C) 98.2 F (36.8 C) 98.5 F (36.9 C)  TempSrc:  Axillary Axillary Oral  SpO2: 91% 97% (!) 89% 97%  Weight:        General: Pt is alert, awake, not in acute distress, obese Cardiovascular: RRR, S1/S2 +, no rubs,  no gallops Respiratory: CTA bilaterally, no wheezing, no rhonchi Abdominal: Soft, NT, ND, bowel sounds + Extremities: no edema, no cyanosis    The results of significant diagnostics from this hospitalization (including imaging, microbiology, ancillary and laboratory) are listed below for reference.     Microbiology: Recent Results (from the past 240 hour(s))  MRSA Next Gen by PCR, Nasal     Status: Abnormal   Collection Time: 06/29/22 11:30 AM   Specimen: Nasal Mucosa; Nasal Swab  Result Value Ref Range Status   MRSA by PCR Next Gen DETECTED (A) NOT DETECTED Final    Comment: RESULT CALLED TO, READ BACK BY AND VERIFIED WITH: RN Roque Lias [phone] @1339  FH  (NOTE) The GeneXpert MRSA Assay (FDA approved for NASAL specimens only), is one component of a comprehensive MRSA colonization surveillance program. It is not intended to diagnose MRSA infection nor to guide or monitor treatment for MRSA infections. Test performance is not FDA approved in patients less than 38 years old. Performed at [facility], [place] [address] [place], [place] [address]      Labs: BNP (last 3 results) Recent Labs    12/23/21 0416  BNP 884.1*   Basic Metabolic Panel: Recent Labs  Lab 06/26/22 1930 06/27/22 0131 06/28/22 0601 06/29/22 0356 06/30/22 2003 07/01/22 0422 07/02/22 0330  NA 140   < > 138 138 137 137 135  K 5.6*   < >  3.5 4.2 4.4 4.9 5.1  CL 98   < > 101 99 101 99 97*  CO2 13*   < > 19* 19* 17* 20* 16*  GLUCOSE 132*   < > 104* 89 87 97 90  BUN 123*   < > 78* 85* 101* 102* 114*  CREATININE 32.88*   < > 21.62* 22.32* 23.58* 23.95* 23.58*  CALCIUM 9.3   < > 8.3* 8.3* 8.4* 8.6* 8.7*  MG 2.8*  --   --   --   --  2.4 2.5*  PHOS  --   --  10.1* 10.6*  --  >30.0*  --    < > = values in this interval not displayed.   Liver Function Tests: Recent Labs  Lab 06/26/22 1930 06/28/22 0601 06/29/22 0356 07/01/22 0422  AST 59*  --   --   --   ALT 47*  --   --   --   ALKPHOS 107  --    --   --   BILITOT 0.4  --   --   --   PROT 6.8  --   --   --   ALBUMIN 3.1* 2.6* 2.5* 2.6*   No results for input(s): "LIPASE", "AMYLASE" in the last 168 hours. No results for input(s): "AMMONIA" in the last 168 hours. CBC: Recent Labs  Lab 06/28/22 0601 06/29/22 0356 06/30/22 2003 07/01/22 0422 07/02/22 0330  WBC 8.8 9.5 10.3 10.5 11.3*  NEUTROABS 6.4 7.9* 8.0*  --   --   HGB 7.6* 7.8* 7.4* 7.1* 7.0*  HCT 23.3* 24.2* 24.6* 23.1* 22.2*  MCV 91.7 93.8 100.0 95.9 95.3  PLT 246 272 222 222 232   Cardiac Enzymes: No results for input(s): "CKTOTAL", "CKMB", "CKMBINDEX", "TROPONINI" in the last 168 hours. BNP: Invalid input(s): "POCBNP" CBG: No results for input(s): "GLUCAP" in the last 168 hours. D-Dimer No results for input(s): "DDIMER" in the last 72 hours. Hgb A1c No results for input(s): "HGBA1C" in the last 72 hours. Lipid Profile No results for input(s): "CHOL", "HDL", "LDLCALC", "TRIG", "CHOLHDL", "LDLDIRECT" in the last 72 hours. Thyroid function studies No results for input(s): "TSH", "T4TOTAL", "T3FREE", "THYROIDAB" in the last 72 hours.  Invalid input(s): "FREET3" Anemia work up No results for input(s): "VITAMINB12", "FOLATE", "FERRITIN", "TIBC", "IRON", "RETICCTPCT" in the last 72 hours. Urinalysis    Component Value Date/Time   COLORURINE YELLOW 06/02/2021 1221   APPEARANCEUR CLOUDY (A) 06/02/2021 1221   LABSPEC 1.010 06/02/2021 1221   PHURINE 7.0 06/02/2021 1221   GLUCOSEU 50 (A) 06/02/2021 1221   HGBUR NEGATIVE 06/02/2021 1221   BILIRUBINUR NEGATIVE 06/02/2021 1221   BILIRUBINUR negative 01/30/2020 1526   BILIRUBINUR neg 09/15/2017 1657   KETONESUR NEGATIVE 06/02/2021 1221   PROTEINUR 100 (A) 06/02/2021 1221   UROBILINOGEN 0.2 01/30/2020 1526   NITRITE NEGATIVE 06/02/2021 1221   LEUKOCYTESUR NEGATIVE 06/02/2021 1221   Sepsis Labs Recent Labs  Lab 06/29/22 0356 06/30/22 2003 07/01/22 0422 07/02/22 0330  WBC 9.5 10.3 10.5 11.3*    Microbiology Recent Results (from the past 240 hour(s))  MRSA Next Gen by PCR, Nasal     Status: Abnormal   Collection Time: 06/29/22 11:30 AM   Specimen: Nasal Mucosa; Nasal Swab  Result Value Ref Range Status   MRSA by PCR Next Gen DETECTED (A) NOT DETECTED Final    Comment: RESULT CALLED TO, READ BACK BY AND VERIFIED WITH: RN Roque Lias [phone] @1339  FH  (NOTE) The GeneXpert MRSA Assay (FDA approved for  NASAL specimens only), is one component of a comprehensive MRSA colonization surveillance program. It is not intended to diagnose MRSA infection nor to guide or monitor treatment for MRSA infections. Test performance is not FDA approved in patients less than 69 years old. Performed at [facility], [place] [address], [place], [place] [address]      Time coordinating discharge: 35 minutes  SIGNED:   Rodena Goldmann, DO [facility] 07/03/2022, 6:48 AM  If 7PM-7AM, please contact night-coverage [email or web address]

## 2022-07-03 NOTE — Progress Notes (Signed)
Let the PCP know

## 2022-07-03 NOTE — Progress Notes (Signed)
Called by RN: informed that patient is leaving AMA again.

## 2022-07-03 NOTE — Progress Notes (Signed)
Patient stated she was leaving AMA. Patient signed AMA document. Left via wheelchair, took cab to destination. Patient stated she was going to train station to go to [employer]. Notified MD.

## 2022-07-03 NOTE — Progress Notes (Addendum)
Patient refusing to have continuous cardiac monitoring. Educated on the importance of having cardiac monitoring. Stated "she was leaving in the morning anyway so might as well take it off now." MD made aware.

## 2022-07-03 NOTE — Progress Notes (Signed)
[place] [facility] and advised staff that pt left hospital early this am. Advised admissions that pt was advised yesterday she could start Monday at clinic. D/C summary and last renal note faxed to clinic for continuation of care (fax#[phone]). Will update local clinic as well.   Melven Sartorius Renal Navigator [phone]

## 2022-07-06 ENCOUNTER — Telehealth: Payer: Self-pay

## 2022-07-06 LAB — TYPE AND SCREEN
ABO/RH(D): A NEG
Antibody Screen: NEGATIVE
Unit division: 0

## 2022-07-06 LAB — BPAM RBC
Blood Product Expiration Date: 202308192359
Unit Type and Rh: 600

## 2022-07-06 NOTE — Telephone Encounter (Signed)
Transition Care Management Unsuccessful Follow-up Telephone Call  Date of discharge and from where:  07/03/2022, [facility] - left [facility], then went to [facility] ED 07/03/2022.    Attempts:  1st Attempt  Reason for unsuccessful TCM follow-up call:  Unable to leave message-  voicemail not set up # [phone]

## 2022-07-08 ENCOUNTER — Telehealth: Payer: Self-pay

## 2022-07-08 NOTE — Telephone Encounter (Signed)
Transition Care Management Unsuccessful Follow-up Telephone Call  Date of discharge and from where:   07/03/2022, [facility] - left [facility], then went to [facility] ED 07/03/2022.   Attempts:  2nd Attempt  Reason for unsuccessful TCM follow-up call:  Unable to leave message - voicemail not set up # [phone]

## 2022-07-09 ENCOUNTER — Telehealth: Payer: Self-pay

## 2022-07-09 NOTE — Telephone Encounter (Signed)
Transition Care Management Unsuccessful Follow-up Telephone Call  Date of discharge and from where:   07/03/2022, [facility] - left [facility],  went to [facility] ED 07/03/2022, then hospitalized at [facility] 8/7-07/08/2022.   Attempts:  3rd Attempt  Reason for unsuccessful TCM follow-up call:  Unable to leave message- voicemail not set up # [phone]   Message sent to patient via MyChart requesting she contact [place] as we have not been able to reach her. Would like  to confirm if she will continue to receive care in [place], [place]

## 2022-08-23 ENCOUNTER — Inpatient Hospital Stay (HOSPITAL_COMMUNITY)
Admission: EM | Admit: 2022-08-23 | Discharge: 2022-08-29 | DRG: 291 | Discharge status: Against Medical Advice | Payer: Medicaid Other | Source: Ambulatory Visit | Attending: Family Medicine | Admitting: Family Medicine

## 2022-08-23 ENCOUNTER — Emergency Department (HOSPITAL_COMMUNITY): Payer: Medicaid Other

## 2022-08-23 ENCOUNTER — Encounter (HOSPITAL_COMMUNITY): Payer: Self-pay | Admitting: Emergency Medicine

## 2022-08-23 DIAGNOSIS — G4700 Insomnia, unspecified: Secondary | ICD-10-CM | POA: Diagnosis present

## 2022-08-23 DIAGNOSIS — M25532 Pain in left wrist: Secondary | ICD-10-CM | POA: Diagnosis present

## 2022-08-23 DIAGNOSIS — Z87441 Personal history of nephrotic syndrome: Secondary | ICD-10-CM

## 2022-08-23 DIAGNOSIS — F4310 Post-traumatic stress disorder, unspecified: Secondary | ICD-10-CM | POA: Diagnosis present

## 2022-08-23 DIAGNOSIS — Z8249 Family history of ischemic heart disease and other diseases of the circulatory system: Secondary | ICD-10-CM

## 2022-08-23 DIAGNOSIS — N189 Chronic kidney disease, unspecified: Secondary | ICD-10-CM | POA: Diagnosis present

## 2022-08-23 DIAGNOSIS — I5023 Acute on chronic systolic (congestive) heart failure: Secondary | ICD-10-CM | POA: Diagnosis present

## 2022-08-23 DIAGNOSIS — R778 Other specified abnormalities of plasma proteins: Secondary | ICD-10-CM | POA: Diagnosis present

## 2022-08-23 DIAGNOSIS — Z8619 Personal history of other infectious and parasitic diseases: Secondary | ICD-10-CM

## 2022-08-23 DIAGNOSIS — F39 Unspecified mood [affective] disorder: Secondary | ICD-10-CM

## 2022-08-23 DIAGNOSIS — F603 Borderline personality disorder: Secondary | ICD-10-CM | POA: Diagnosis present

## 2022-08-23 DIAGNOSIS — J90 Pleural effusion, not elsewhere classified: Secondary | ICD-10-CM

## 2022-08-23 DIAGNOSIS — I5043 Acute on chronic combined systolic (congestive) and diastolic (congestive) heart failure: Secondary | ICD-10-CM

## 2022-08-23 DIAGNOSIS — N186 End stage renal disease: Secondary | ICD-10-CM | POA: Diagnosis present

## 2022-08-23 DIAGNOSIS — I5020 Unspecified systolic (congestive) heart failure: Secondary | ICD-10-CM | POA: Diagnosis present

## 2022-08-23 DIAGNOSIS — Z888 Allergy status to other drugs, medicaments and biological substances status: Secondary | ICD-10-CM

## 2022-08-23 DIAGNOSIS — I248 Other forms of acute ischemic heart disease: Secondary | ICD-10-CM | POA: Diagnosis present

## 2022-08-23 DIAGNOSIS — F1910 Other psychoactive substance abuse, uncomplicated: Secondary | ICD-10-CM | POA: Diagnosis present

## 2022-08-23 DIAGNOSIS — Z7251 High risk heterosexual behavior: Secondary | ICD-10-CM

## 2022-08-23 DIAGNOSIS — I5031 Acute diastolic (congestive) heart failure: Secondary | ICD-10-CM

## 2022-08-23 DIAGNOSIS — Z5329 Procedure and treatment not carried out because of patient's decision for other reasons: Secondary | ICD-10-CM | POA: Diagnosis not present

## 2022-08-23 DIAGNOSIS — R079 Chest pain, unspecified: Secondary | ICD-10-CM

## 2022-08-23 DIAGNOSIS — I5041 Acute combined systolic (congestive) and diastolic (congestive) heart failure: Secondary | ICD-10-CM

## 2022-08-23 DIAGNOSIS — Z992 Dependence on renal dialysis: Secondary | ICD-10-CM

## 2022-08-23 DIAGNOSIS — I059 Rheumatic mitral valve disease, unspecified: Secondary | ICD-10-CM | POA: Diagnosis present

## 2022-08-23 DIAGNOSIS — F319 Bipolar disorder, unspecified: Secondary | ICD-10-CM | POA: Diagnosis present

## 2022-08-23 DIAGNOSIS — Z833 Family history of diabetes mellitus: Secondary | ICD-10-CM

## 2022-08-23 DIAGNOSIS — I340 Nonrheumatic mitral (valve) insufficiency: Secondary | ICD-10-CM

## 2022-08-23 DIAGNOSIS — M898X9 Other specified disorders of bone, unspecified site: Secondary | ICD-10-CM | POA: Diagnosis present

## 2022-08-23 DIAGNOSIS — Z91158 Patient's noncompliance with renal dialysis for other reason: Secondary | ICD-10-CM

## 2022-08-23 DIAGNOSIS — N898 Other specified noninflammatory disorders of vagina: Secondary | ICD-10-CM | POA: Diagnosis present

## 2022-08-23 DIAGNOSIS — F1410 Cocaine abuse, uncomplicated: Secondary | ICD-10-CM | POA: Diagnosis present

## 2022-08-23 DIAGNOSIS — R7989 Other specified abnormal findings of blood chemistry: Secondary | ICD-10-CM | POA: Diagnosis present

## 2022-08-23 DIAGNOSIS — G629 Polyneuropathy, unspecified: Secondary | ICD-10-CM | POA: Diagnosis present

## 2022-08-23 DIAGNOSIS — J209 Acute bronchitis, unspecified: Secondary | ICD-10-CM | POA: Diagnosis present

## 2022-08-23 DIAGNOSIS — I5032 Chronic diastolic (congestive) heart failure: Secondary | ICD-10-CM

## 2022-08-23 DIAGNOSIS — D631 Anemia in chronic kidney disease: Secondary | ICD-10-CM | POA: Diagnosis present

## 2022-08-23 DIAGNOSIS — F1210 Cannabis abuse, uncomplicated: Secondary | ICD-10-CM | POA: Diagnosis present

## 2022-08-23 DIAGNOSIS — I5033 Acute on chronic diastolic (congestive) heart failure: Secondary | ICD-10-CM

## 2022-08-23 DIAGNOSIS — F17210 Nicotine dependence, cigarettes, uncomplicated: Secondary | ICD-10-CM | POA: Diagnosis present

## 2022-08-23 DIAGNOSIS — M19032 Primary osteoarthritis, left wrist: Secondary | ICD-10-CM | POA: Diagnosis present

## 2022-08-23 DIAGNOSIS — R9389 Abnormal findings on diagnostic imaging of other specified body structures: Secondary | ICD-10-CM | POA: Diagnosis present

## 2022-08-23 DIAGNOSIS — Z79899 Other long term (current) drug therapy: Secondary | ICD-10-CM

## 2022-08-23 DIAGNOSIS — I3139 Other pericardial effusion (noninflammatory): Secondary | ICD-10-CM | POA: Diagnosis present

## 2022-08-23 DIAGNOSIS — Z5900 Homelessness unspecified: Secondary | ICD-10-CM

## 2022-08-23 DIAGNOSIS — I132 Hypertensive heart and chronic kidney disease with heart failure and with stage 5 chronic kidney disease, or end stage renal disease: Principal | ICD-10-CM | POA: Diagnosis present

## 2022-08-23 LAB — CBC
HCT: 26.7 % — ABNORMAL LOW (ref 36.0–46.0)
Hemoglobin: 8.2 g/dL — ABNORMAL LOW (ref 12.0–15.0)
MCH: 30.8 pg (ref 26.0–34.0)
MCHC: 30.7 g/dL (ref 30.0–36.0)
MCV: 100.4 fL — ABNORMAL HIGH (ref 80.0–100.0)
Platelets: 324 10*3/uL (ref 150–400)
RBC: 2.66 MIL/uL — ABNORMAL LOW (ref 3.87–5.11)
RDW: 16 % — ABNORMAL HIGH (ref 11.5–15.5)
WBC: 9.6 10*3/uL (ref 4.0–10.5)
nRBC: 0 % (ref 0.0–0.2)

## 2022-08-23 LAB — BASIC METABOLIC PANEL
Anion gap: 17 — ABNORMAL HIGH (ref 5–15)
BUN: 70 mg/dL — ABNORMAL HIGH (ref 6–20)
CO2: 22 mmol/L (ref 22–32)
Calcium: 10.2 mg/dL (ref 8.9–10.3)
Chloride: 102 mmol/L (ref 98–111)
Creatinine, Ser: 10.99 mg/dL — ABNORMAL HIGH (ref 0.44–1.00)
GFR, Estimated: 4 mL/min — ABNORMAL LOW (ref >60)
Glucose, Bld: 96 mg/dL (ref 70–99)
Potassium: 5 mmol/L (ref 3.5–5.1)
Sodium: 141 mmol/L (ref 135–145)

## 2022-08-23 LAB — TROPONIN I (HIGH SENSITIVITY): Troponin I (High Sensitivity): 399 ng/L (ref <18)

## 2022-08-23 LAB — I-STAT BETA HCG BLOOD, ED (MC, WL, AP ONLY): I-stat hCG, quantitative: <5 m[IU]/mL (ref <5)

## 2022-08-23 NOTE — ED Notes (Signed)
Lab called w/ critical trop of 399, provider aware as well as Agricultural consultant.

## 2022-08-23 NOTE — ED Triage Notes (Addendum)
Per EMS, pt from [facility] c/o chest pain for the past two hours that starts in her upper at her throat and radiates to her upper abdominal region."  Pt gets dialysis MWF, having a full treatment on Friday.    Pt has had a productive cough X two weeks.  Given 324 ASA  168/124 HR 114 95% RA  [name] began to have a nose bleed in triage as she blew her nose.  RN instructed pt to hold pressure.  She again blew her nose.  RN educated on the importance of not blowing her nose at this moment.

## 2022-08-24 ENCOUNTER — Inpatient Hospital Stay (HOSPITAL_COMMUNITY): Payer: Medicaid Other

## 2022-08-24 DIAGNOSIS — M19032 Primary osteoarthritis, left wrist: Secondary | ICD-10-CM | POA: Diagnosis present

## 2022-08-24 DIAGNOSIS — I5043 Acute on chronic combined systolic (congestive) and diastolic (congestive) heart failure: Secondary | ICD-10-CM

## 2022-08-24 DIAGNOSIS — I248 Other forms of acute ischemic heart disease: Secondary | ICD-10-CM | POA: Diagnosis present

## 2022-08-24 DIAGNOSIS — I5033 Acute on chronic diastolic (congestive) heart failure: Secondary | ICD-10-CM

## 2022-08-24 DIAGNOSIS — I3139 Other pericardial effusion (noninflammatory): Secondary | ICD-10-CM

## 2022-08-24 DIAGNOSIS — D631 Anemia in chronic kidney disease: Secondary | ICD-10-CM | POA: Diagnosis present

## 2022-08-24 DIAGNOSIS — F1210 Cannabis abuse, uncomplicated: Secondary | ICD-10-CM | POA: Diagnosis present

## 2022-08-24 DIAGNOSIS — I5032 Chronic diastolic (congestive) heart failure: Secondary | ICD-10-CM

## 2022-08-24 DIAGNOSIS — M898X9 Other specified disorders of bone, unspecified site: Secondary | ICD-10-CM | POA: Diagnosis present

## 2022-08-24 DIAGNOSIS — R9389 Abnormal findings on diagnostic imaging of other specified body structures: Secondary | ICD-10-CM | POA: Diagnosis present

## 2022-08-24 DIAGNOSIS — I059 Rheumatic mitral valve disease, unspecified: Secondary | ICD-10-CM | POA: Diagnosis present

## 2022-08-24 DIAGNOSIS — I5020 Unspecified systolic (congestive) heart failure: Secondary | ICD-10-CM | POA: Diagnosis present

## 2022-08-24 DIAGNOSIS — Z5329 Procedure and treatment not carried out because of patient's decision for other reasons: Secondary | ICD-10-CM | POA: Diagnosis not present

## 2022-08-24 DIAGNOSIS — F17210 Nicotine dependence, cigarettes, uncomplicated: Secondary | ICD-10-CM | POA: Diagnosis present

## 2022-08-24 DIAGNOSIS — I5031 Acute diastolic (congestive) heart failure: Secondary | ICD-10-CM

## 2022-08-24 DIAGNOSIS — Z79899 Other long term (current) drug therapy: Secondary | ICD-10-CM | POA: Diagnosis not present

## 2022-08-24 DIAGNOSIS — G4700 Insomnia, unspecified: Secondary | ICD-10-CM | POA: Diagnosis present

## 2022-08-24 DIAGNOSIS — J209 Acute bronchitis, unspecified: Secondary | ICD-10-CM | POA: Diagnosis present

## 2022-08-24 DIAGNOSIS — I5041 Acute combined systolic (congestive) and diastolic (congestive) heart failure: Secondary | ICD-10-CM

## 2022-08-24 DIAGNOSIS — R778 Other specified abnormalities of plasma proteins: Secondary | ICD-10-CM | POA: Diagnosis not present

## 2022-08-24 DIAGNOSIS — F1410 Cocaine abuse, uncomplicated: Secondary | ICD-10-CM | POA: Diagnosis present

## 2022-08-24 DIAGNOSIS — N898 Other specified noninflammatory disorders of vagina: Secondary | ICD-10-CM | POA: Diagnosis present

## 2022-08-24 DIAGNOSIS — J90 Pleural effusion, not elsewhere classified: Secondary | ICD-10-CM | POA: Diagnosis present

## 2022-08-24 DIAGNOSIS — R079 Chest pain, unspecified: Secondary | ICD-10-CM | POA: Diagnosis not present

## 2022-08-24 DIAGNOSIS — Z833 Family history of diabetes mellitus: Secondary | ICD-10-CM | POA: Diagnosis not present

## 2022-08-24 DIAGNOSIS — I132 Hypertensive heart and chronic kidney disease with heart failure and with stage 5 chronic kidney disease, or end stage renal disease: Secondary | ICD-10-CM | POA: Diagnosis present

## 2022-08-24 DIAGNOSIS — I340 Nonrheumatic mitral (valve) insufficiency: Secondary | ICD-10-CM | POA: Diagnosis not present

## 2022-08-24 DIAGNOSIS — Z992 Dependence on renal dialysis: Secondary | ICD-10-CM | POA: Diagnosis not present

## 2022-08-24 DIAGNOSIS — I5023 Acute on chronic systolic (congestive) heart failure: Secondary | ICD-10-CM | POA: Diagnosis present

## 2022-08-24 DIAGNOSIS — F4310 Post-traumatic stress disorder, unspecified: Secondary | ICD-10-CM | POA: Diagnosis present

## 2022-08-24 DIAGNOSIS — Z888 Allergy status to other drugs, medicaments and biological substances status: Secondary | ICD-10-CM | POA: Diagnosis not present

## 2022-08-24 DIAGNOSIS — F319 Bipolar disorder, unspecified: Secondary | ICD-10-CM | POA: Diagnosis present

## 2022-08-24 DIAGNOSIS — N186 End stage renal disease: Secondary | ICD-10-CM | POA: Diagnosis present

## 2022-08-24 DIAGNOSIS — F603 Borderline personality disorder: Secondary | ICD-10-CM | POA: Diagnosis present

## 2022-08-24 DIAGNOSIS — G629 Polyneuropathy, unspecified: Secondary | ICD-10-CM | POA: Diagnosis present

## 2022-08-24 LAB — RENAL FUNCTION PANEL
Albumin: 2.7 g/dL — ABNORMAL LOW (ref 3.5–5.0)
Anion gap: 16 — ABNORMAL HIGH (ref 5–15)
BUN: 76 mg/dL — ABNORMAL HIGH (ref 6–20)
CO2: 21 mmol/L — ABNORMAL LOW (ref 22–32)
Calcium: 9.6 mg/dL (ref 8.9–10.3)
Chloride: 102 mmol/L (ref 98–111)
Creatinine, Ser: 11.66 mg/dL — ABNORMAL HIGH (ref 0.44–1.00)
GFR, Estimated: 4 mL/min — ABNORMAL LOW (ref >60)
Glucose, Bld: 130 mg/dL — ABNORMAL HIGH (ref 70–99)
Phosphorus: 8.6 mg/dL — ABNORMAL HIGH (ref 2.5–4.6)
Potassium: 4.7 mmol/L (ref 3.5–5.1)
Sodium: 139 mmol/L (ref 135–145)

## 2022-08-24 LAB — CBC
HCT: 24.7 % — ABNORMAL LOW (ref 36.0–46.0)
Hemoglobin: 7.8 g/dL — ABNORMAL LOW (ref 12.0–15.0)
MCH: 31.3 pg (ref 26.0–34.0)
MCHC: 31.6 g/dL (ref 30.0–36.0)
MCV: 99.2 fL (ref 80.0–100.0)
Platelets: 298 10*3/uL (ref 150–400)
RBC: 2.49 MIL/uL — ABNORMAL LOW (ref 3.87–5.11)
RDW: 16.1 % — ABNORMAL HIGH (ref 11.5–15.5)
WBC: 9.3 10*3/uL (ref 4.0–10.5)
nRBC: 0 % (ref 0.0–0.2)

## 2022-08-24 LAB — HEPATITIS B CORE ANTIBODY, TOTAL: Hep B Core Total Ab: NONREACTIVE

## 2022-08-24 LAB — HEPATITIS B SURFACE ANTIGEN: Hepatitis B Surface Ag: NONREACTIVE

## 2022-08-24 LAB — TROPONIN I (HIGH SENSITIVITY): Troponin I (High Sensitivity): 326 ng/L (ref <18)

## 2022-08-24 LAB — PROCALCITONIN: Procalcitonin: 0.97 ng/mL

## 2022-08-24 LAB — BRAIN NATRIURETIC PEPTIDE: B Natriuretic Peptide: 3410.3 pg/mL — ABNORMAL HIGH (ref 0.0–100.0)

## 2022-08-24 LAB — HEPATITIS C ANTIBODY: HCV Ab: NONREACTIVE

## 2022-08-24 LAB — HEPATITIS B SURFACE ANTIBODY,QUALITATIVE: Hep B S Ab: REACTIVE — AB

## 2022-08-24 MED ORDER — HEPARIN SODIUM (PORCINE) 1000 UNIT/ML DIALYSIS
2500.0000 [IU] | INTRAMUSCULAR | Status: DC | PRN
  Administered 2022-08-24: 2500 [IU] via INTRAVENOUS_CENTRAL
  Filled 2022-08-24 (×3): qty 3

## 2022-08-24 MED ORDER — CARVEDILOL 3.125 MG PO TABS
6.2500 mg | ORAL_TABLET | Freq: Two times a day (BID) | ORAL | Status: DC
  Administered 2022-08-24 – 2022-08-26 (×5): 6.25 mg via ORAL
  Filled 2022-08-24 (×5): qty 2

## 2022-08-24 MED ORDER — CARVEDILOL 12.5 MG PO TABS: 12.5000 mg | ORAL_TABLET | Freq: Two times a day (BID) | ORAL | Status: DC

## 2022-08-24 MED ORDER — BENZONATATE 100 MG PO CAPS
200.0000 mg | ORAL_CAPSULE | Freq: Two times a day (BID) | ORAL | Status: DC | PRN
  Administered 2022-08-24 – 2022-08-28 (×8): 200 mg via ORAL
  Filled 2022-08-24 (×8): qty 2

## 2022-08-24 MED ORDER — ALBUTEROL SULFATE (2.5 MG/3ML) 0.083% IN NEBU: 2.5000 mg | INHALATION_SOLUTION | Freq: Four times a day (QID) | RESPIRATORY_TRACT | Status: DC | PRN

## 2022-08-24 MED ORDER — ACETAMINOPHEN 325 MG PO TABS
650.0000 mg | ORAL_TABLET | Freq: Four times a day (QID) | ORAL | Status: DC | PRN
  Administered 2022-08-26 – 2022-08-27 (×3): 650 mg via ORAL
  Filled 2022-08-24 (×3): qty 2

## 2022-08-24 MED ORDER — HYDROCODONE BIT-HOMATROP MBR 5-1.5 MG/5ML PO SOLN
5.0000 mL | ORAL | Status: DC | PRN
  Administered 2022-08-24 – 2022-08-28 (×6): 5 mL via ORAL
  Filled 2022-08-24 (×8): qty 5

## 2022-08-24 MED ORDER — SODIUM CHLORIDE 0.9 % IV SOLN
100.0000 mg | Freq: Two times a day (BID) | INTRAVENOUS | Status: DC
  Administered 2022-08-24 – 2022-08-25 (×2): 100 mg via INTRAVENOUS
  Filled 2022-08-24 (×2): qty 100

## 2022-08-24 MED ORDER — ONDANSETRON HCL 4 MG/2ML IJ SOLN: 4.0000 mg | Freq: Four times a day (QID) | INTRAMUSCULAR | Status: DC | PRN

## 2022-08-24 MED ORDER — SODIUM CHLORIDE 0.9 % IV SOLN
2.0000 g | INTRAVENOUS | Status: DC
  Administered 2022-08-24 – 2022-08-28 (×5): 2 g via INTRAVENOUS
  Filled 2022-08-24 (×5): qty 20

## 2022-08-24 MED ORDER — SODIUM CHLORIDE 0.9% FLUSH
3.0000 mL | Freq: Two times a day (BID) | INTRAVENOUS | Status: DC
  Administered 2022-08-24 – 2022-08-28 (×8): 3 mL via INTRAVENOUS

## 2022-08-24 MED ORDER — ACETAMINOPHEN 650 MG RE SUPP: 650.0000 mg | Freq: Four times a day (QID) | RECTAL | Status: DC | PRN

## 2022-08-24 MED ORDER — SEVELAMER CARBONATE 800 MG PO TABS: 1600.0000 mg | ORAL_TABLET | Freq: Three times a day (TID) | ORAL | Status: DC

## 2022-08-24 MED ORDER — LORAZEPAM 2 MG/ML IJ SOLN
0.5000 mg | Freq: Four times a day (QID) | INTRAMUSCULAR | Status: AC | PRN
  Administered 2022-08-26 (×2): 0.5 mg via INTRAVENOUS
  Filled 2022-08-24 (×2): qty 1

## 2022-08-24 MED ORDER — HEPARIN SODIUM (PORCINE) 5000 UNIT/ML IJ SOLN
5000.0000 [IU] | Freq: Three times a day (TID) | INTRAMUSCULAR | Status: DC
  Administered 2022-08-24 – 2022-08-25 (×3): 5000 [IU] via SUBCUTANEOUS
  Filled 2022-08-24 (×6): qty 1

## 2022-08-24 MED ORDER — ONDANSETRON HCL 4 MG PO TABS: 4.0000 mg | ORAL_TABLET | Freq: Four times a day (QID) | ORAL | Status: DC | PRN

## 2022-08-24 MED ORDER — CHLORHEXIDINE GLUCONATE CLOTH 2 % EX PADS
6.0000 | MEDICATED_PAD | Freq: Every day | CUTANEOUS | Status: DC
  Administered 2022-08-26: 6 via TOPICAL

## 2022-08-24 MED ORDER — CALCIUM ACETATE (PHOS BINDER) 667 MG PO CAPS
667.0000 mg | ORAL_CAPSULE | Freq: Three times a day (TID) | ORAL | Status: DC
  Administered 2022-08-24 – 2022-08-25 (×3): 667 mg via ORAL
  Filled 2022-08-24 (×4): qty 1

## 2022-08-24 MED ORDER — CALCITRIOL 0.25 MCG PO CAPS
0.2500 ug | ORAL_CAPSULE | ORAL | Status: DC
  Administered 2022-08-27: 0.25 ug via ORAL
  Filled 2022-08-24 (×2): qty 1

## 2022-08-24 MED ORDER — DICLOFENAC SODIUM 1 % EX GEL: 4.0000 g | Freq: Four times a day (QID) | CUTANEOUS | Status: DC | PRN

## 2022-08-24 NOTE — Progress Notes (Signed)
Requested to see pt for out-pt HD needs at d/c. Pt known to navigator from pt's previous admissions. Pt has a hx at [facility]). Contacted clinic and was advised clinic has d/c pt. Attempted to meet with pt at bedside while pt receiving HD. Pt very sleepy and kept falling asleep and unable to answer questions at this time. From chart review, pt was brought to hospital from [facility] in [place]. Pt was recently in the [place] area and receiving HD at [facility]. There are no [facility] clinics in [place] therefore will proceed with a referral to [facility] admissions for review (pt also has a hx with [facility]). Will attempt to speak to pt at a later time once pt is more awake and able to have a conversation. Will assist as needed.   Melven Sartorius Renal Navigator [phone]

## 2022-08-24 NOTE — ED Provider Notes (Signed)
[facility] EMERGENCY DEPARTMENT Provider Note   CSN: [id number] Arrival date & time: 08/23/22  2057     History  Chief Complaint  Patient presents with   Chest Pain    Sheyli Dina is a 26 y.o. female.  26 year old female with multiple past medical problems presents to the ER today with chest pain, neck pain, cough and shortness of breath.  She also is a dialysis patient states that she has had dialysis since Friday she is due on Monday does not have a dialysis center around here.  She states that she also has some arthritis with left wrist pain.  She states she has not had any of her medicines in 3 weeks.  I reviewed the records it appears that patient has been in and out of the ER over the last 7 to 10 days with multiple different complaints.  None of these are in this area.  She denies fever or productive cough.   Chest Pain      Home Medications Prior to Admission medications   Medication Sig Start Date End Date Taking? Authorizing Provider  acetaminophen (TYLENOL) 500 MG tablet Take 2 tablets (1,000 mg total) by mouth every 8 (eight) hours. Patient taking differently: Take 1,000 mg by mouth every 8 (eight) hours as needed for mild pain or headache. 02/16/22   [name], MD  acyclovir ointment (ZOVIRAX) 5 % Apply topically 4 (four) times daily. 05/07/22   Dwyane Dee, MD  ARIPiprazole (ABILIFY) 15 MG tablet Take 0.5 tablets (7.5 mg total) by mouth daily. 12/27/21   Regalado, Belkys A, MD  B Complex-C-Folic Acid (RENAL VITAMIN) 0.8 MG TABS Take 1 tablet by mouth daily. 10/29/21   [provider]  calcitRIOL (ROCALTROL) 0.25 MCG capsule Take 0.25 mcg by mouth Every Tuesday,Thursday,and Saturday with dialysis. 09/25/20   [provider]  calcium acetate (PHOSLO) 667 MG capsule Take 667 mg by mouth 3 (three) times daily with meals. 07/03/21   [provider]  Darbepoetin Alfa (ARANESP) 150 MCG/0.3ML SOSY injection Inject 0.3 mLs  (150 mcg total) into the vein every Saturday with hemodialysis. 02/21/22   [name], MD  diclofenac Sodium (VOLTAREN) 1 % GEL Apply 4 g topically 4 (four) times daily as needed (Pain to L wrist and R knee). 02/16/22   [name], MD  diphenhydrAMINE-zinc acetate (BENADRYL) cream Apply 1 application. topically 2 (two) times daily as needed for itching. 02/16/22   [name], MD  doxycycline (VIBRA-TABS) 100 MG tablet Take 100 mg by mouth See admin instructions. Bid  x 5 days 05/17/22   [provider]  gabapentin (NEURONTIN) 600 MG tablet Take 300 mg by mouth 3 (three) times daily as needed (for neuropathy pain). 03/31/22   [provider]  hydrocerin (EUCERIN) CREA Apply 1 application. topically 2 (two) times daily. Patient taking differently: Apply 1 application  topically 2 (two) times daily as needed (dry skin/itching). 02/16/22   [name], MD  hydrOXYzine (VISTARIL) 25 MG capsule Take 1 capsule (25 mg total) by mouth every 8 (eight) hours as needed for anxiety. 03/25/22   [name], MD  lidocaine-prilocaine (EMLA) cream Apply 1 application topically daily as needed (port site access on dialysis days (Tuesday's, Thursday's, and Saturday's). 03/06/21   [provider]  losartan (COZAAR) 50 MG tablet Take 50 mg by mouth daily. 12/11/21   [provider]  methocarbamol (ROBAXIN) 500 MG tablet Take 2 tablets (1,000 mg total) by mouth at bedtime. Patient not  taking: Reported on 05/15/2022 02/16/22   [name], MD  mineral oil-hydrophilic petrolatum (AQUAPHOR) ointment Apply 1 Application topically 3 (three) times daily as needed for dry skin or irritation. 05/17/22   [provider]  omeprazole (PRILOSEC) 20 MG capsule TAKE 1 CAPSULE BY MOUTH EVERY DAY Patient taking differently: Take 20 mg by mouth daily as needed (for heartburn or reflux). 04/01/22   [name], MD  oxyCODONE (ROXICODONE) 5 MG immediate release tablet Take 1  tablet (5 mg total) by mouth every 4 (four) hours as needed for severe pain. 05/21/22   Maudie Flakes, MD  polyethylene glycol (MIRALAX / GLYCOLAX) 17 g packet Take 17 g by mouth 2 (two) times daily. Patient not taking: Reported on 05/15/2022 02/16/22   [name], MD  ramelteon (ROZEREM) 8 MG tablet Take 1 tablet (8 mg total) by mouth at bedtime. Patient not taking: Reported on 05/15/2022 02/16/22   [name], MD  senna-docusate (SENOKOT-S) 8.6-50 MG tablet Take 2 tablets by mouth 2 (two) times daily. Patient not taking: Reported on 05/15/2022 02/16/22   [name], MD  sevelamer carbonate (RENVELA) 800 MG tablet Take 2 tablets (1,600 mg total) by mouth 3 (three) times daily with meals. Patient not taking: Reported on 05/15/2022 02/16/22   [name], MD  torsemide (DEMADEX) 100 MG tablet Take 100 mg by mouth every morning. 03/31/22   [provider]  traZODone (DESYREL) 100 MG tablet Take 100 mg by mouth at bedtime.    [provider]      Allergies    Prozac [fluoxetine hcl], Wellbutrin [bupropion], and Prednisone    Review of Systems   Review of Systems  Cardiovascular:  Positive for chest pain.    Physical Exam Updated Vital Signs BP (!) 164/120 (BP Location: Right Arm)   Pulse (!) 112   Temp 98 F (36.7 C) (Oral)   Resp 20   SpO2 100%  Physical Exam Vitals and nursing note reviewed.  Constitutional:      Appearance: She is well-developed.  HENT:     Head: Normocephalic and atraumatic.  Cardiovascular:     Rate and Rhythm: Normal rate and regular rhythm.  Pulmonary:     Effort: Pulmonary effort is normal. Tachypnea present. No respiratory distress.     Breath sounds: No stridor. Decreased breath sounds present.  Abdominal:     General: There is no distension.     Palpations: Abdomen is soft.  Musculoskeletal:     Cervical back: Normal range of motion.  Skin:    General: Skin is warm and dry.  Neurological:     Mental Status: She is  alert.     ED Results / Procedures / Treatments   Labs (all labs ordered are listed, but only abnormal results are displayed) Labs Reviewed  BASIC METABOLIC PANEL - Abnormal; Notable for the following components:      Result Value   BUN 70 (*)    Creatinine, Ser 10.99 (*)    GFR, Estimated 4 (*)    Anion gap 17 (*)    All other components within normal limits  CBC - Abnormal; Notable for the following components:   RBC 2.66 (*)    Hemoglobin 8.2 (*)    HCT 26.7 (*)    MCV 100.4 (*)    RDW 16.0 (*)    All other components within normal limits  TROPONIN I (HIGH SENSITIVITY) - Abnormal; Notable for the following components:   Troponin I (High Sensitivity)  399 (*)    All other components within normal limits  TROPONIN I (HIGH SENSITIVITY) - Abnormal; Notable for the following components:   Troponin I (High Sensitivity) 326 (*)    All other components within normal limits  I-STAT BETA HCG BLOOD, ED ([facility], [facility], [facility] ONLY)    EKG EKG Interpretation  Date/Time:  Sunday August 23 2022 21:14:05 EDT Ventricular Rate:  114 PR Interval:  114 QRS Duration: 84 QT Interval:  342 QTC Calculation: 471 R Axis:   12 Text Interpretation: Sinus tachycardia Low voltage QRS Cannot rule out Anterior infarct , age undetermined Abnormal ECG When compared with ECG of 30-Jun-2022 19:20, PREVIOUS ECG IS PRESENT Confirmed by Merrily Pew [phone]) on 08/24/2022 2:22:20 AM  Radiology DG Chest 2 View  Result Date: 08/23/2022 CLINICAL DATA:  Chest pain EXAM: CHEST - 2 VIEW COMPARISON:  06/30/2022 FINDINGS: Lung volumes are small. Small bilateral pleural effusions are present. Right basilar peripheral opacity may represent a peripherally loculated pleural fluid component. Patchy atelectasis or infiltrate is seen at the left lung base. No pneumothorax. Mild cardiomegaly is stable. Perihilar interstitial pulmonary edema persists in keeping with mild cardiogenic failure. IMPRESSION: 1. Mild cardiogenic failure  with small bilateral pleural effusions. 2. Left basilar atelectasis or infiltrate. 3. Peripheral right basilar opacity possibly representing a loculated pleural fluid component. Electronically Signed   By: Fidela Salisbury M.D.   On: 08/23/2022 21:46    Procedures Procedures    Medications Ordered in ED Medications  HYDROcodone bit-homatropine (HYCODAN) 5-1.5 MG/5ML syrup 5 mL (5 mLs Oral Given 08/24/22 0350)    ED Course/ Medical Decision Making/ A&P                           Medical Decision Making Amount and/or Complexity of Data Reviewed Labs: ordered. Radiology: ordered.  Risk Prescription drug management. Decision regarding hospitalization.   Chest x-ray reviewed by myself and appears that she does have a pleural effusion bilaterally.  She has some pulmonary edema with that as well.  She has some mild lower extremity edema.  Her troponin is elevated at 399.  This is elevated quite a bit more than what the other ones in the system would suggest is her baseline.  EKG is nonischemic.  However will check a second troponin to see what the trend is and then probably discussed with medicine for admission and she will need to get set up with dialysis while she is here and possibly thoracentesis.  She is not in any distress at this time. Second troponin was still quite a bit elevated over her previous but lower than the first 1 here today.  Will hold on heparin since it is not continuing to rise. Do not see an indication for cards consult at this time.  I doubt she is having it acute does not necessarily however she ponies dialysis and to have this evaluated further and also the possibility for thoracentesis.  I discussed with Dr. Jonnie Finner with nephrology who will see her for dialysis later today.  Consult out for hospitalist admission.   Final Clinical Impression(s) / ED Diagnoses Final diagnoses:  Nonspecific chest pain  Pleural effusion  Elevated troponin    Rx / DC Orders ED  Discharge Orders     None         Kimora Stankovic, Corene Cornea, MD 08/24/22 [id number]
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
## 2022-08-24 NOTE — Progress Notes (Signed)
Treatment completed:   Patient tolerated well.  Pt awaiting transport back to the room  Alert, without acute distress.  Hand-off given to patient's nurse.   Access used: Right AVF Access issues: None  08/24/22 1406  Vitals  Temp (!) 97.4 F (36.3 C)  Pulse Rate 79  Resp (!) 28  BP 134/82  SpO2 100 %  O2 Device Room Air  Weight  (unable to get post weight, pt refuse to stand and in ED bed)  Post Treatment  Dialyzer Clearance Lightly streaked  Duration of HD Treatment -hour(s) 3.5 hour(s)  Liters Processed 74.4  Fluid Removed 3000 mL  Tolerated HD Treatment Yes  AVG/AVF Arterial Site Held (minutes) 5 minutes  AVG/AVF Venous Site Held (minutes) 5 minutes    [facility]
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
## 2022-08-24 NOTE — Progress Notes (Signed)
Patient seen and examined on Hemodialysis. BP (!) 153/104   Pulse (!) 113   Temp 98 F (36.7 C)   Resp (!) 29   SpO2 100%   QB 400 mL/ min via AVF, UF goal 4L  Tolerating treatment without complaints at this time.  States that she has moved back to the [place] area and will need a HD unit.  Will have renal navigator assist.  Madelon Lips MD [place] [facility] [phone] 11:34 AM

## 2022-08-24 NOTE — Progress Notes (Signed)
Received patient in bed to unit.  Alert and oriented.  Informed consent signed and in chart.   Treatment initiated: VSS, Pt denies any complaints.    08/24/22 1011  Vitals  Temp 98 F (36.7 C)  Pulse Rate (!) 113  Resp (!) 29  BP (!) 150/104  SpO2 100 %  O2 Device Room Air  Pre Treatment  Is pt a NEW START this admission?  No  What is patient's outpatient schedule? MWF  Vascular access used during treatment Fistula  Patient is receiving dialysis in a chair No  Hemodialysis Consent Verified Yes  Hemodialysis Standing Orders Initiated Yes  ECG (Telemetry) Monitor On Yes  Prime Ordered Normal Saline  Length of  DialysisTreatment -hour(s) 3.5 Hour(s)  Dialysis mode HD  Dialyzer Revaclear 400  Dialysate 2K;2.5 Ca  Dialysis Anticoagulation Automated NS Flushes  Dialysate Flow Ordered 300  Blood Flow Rate Ordered 400 mL/min  Ultrafiltration Goal 3000 Liters  Pre Treatment Labs CBC;Renal panel  Dialysis Blood Pressure Support Ordered Normal Saline     08/24/22 1011  Vitals  Temp 98 F (36.7 C)  Pulse Rate (!) 113  Resp (!) 29  BP (!) 150/104  SpO2 100 %  O2 Device Room Air  Pre Treatment  Is pt a NEW START this admission?  No  What is patient's outpatient schedule? MWF  Vascular access used during treatment Fistula  Patient is receiving dialysis in a chair No  Hemodialysis Consent Verified Yes  Hemodialysis Standing Orders Initiated Yes  ECG (Telemetry) Monitor On Yes  Prime Ordered Normal Saline  Length of  DialysisTreatment -hour(s) 3.5 Hour(s)  Dialysis mode HD  Dialyzer Revaclear 400  Dialysate 2K;2.5 Ca  Dialysis Anticoagulation Automated NS Flushes  Dialysate Flow Ordered 300  Blood Flow Rate Ordered 400 mL/min  Ultrafiltration Goal 3000 Liters  Pre Treatment Labs CBC;Renal panel  Dialysis Blood Pressure Support Ordered Normal Saline   Unable to attain pre weight d/t ED bed   Natasha Long [facility]

## 2022-08-24 NOTE — ED Notes (Signed)
Patient removed monitors and ambulated to restroom.

## 2022-08-25 ENCOUNTER — Encounter (HOSPITAL_COMMUNITY): Payer: Self-pay | Admitting: Internal Medicine

## 2022-08-25 ENCOUNTER — Other Ambulatory Visit: Payer: Self-pay

## 2022-08-25 ENCOUNTER — Inpatient Hospital Stay (HOSPITAL_COMMUNITY): Payer: Medicaid Other

## 2022-08-25 DIAGNOSIS — J90 Pleural effusion, not elsewhere classified: Secondary | ICD-10-CM

## 2022-08-25 DIAGNOSIS — M25532 Pain in left wrist: Secondary | ICD-10-CM

## 2022-08-25 DIAGNOSIS — R9389 Abnormal findings on diagnostic imaging of other specified body structures: Secondary | ICD-10-CM | POA: Diagnosis not present

## 2022-08-25 DIAGNOSIS — R079 Chest pain, unspecified: Secondary | ICD-10-CM

## 2022-08-25 DIAGNOSIS — F17210 Nicotine dependence, cigarettes, uncomplicated: Secondary | ICD-10-CM | POA: Insufficient documentation

## 2022-08-25 DIAGNOSIS — F39 Unspecified mood [affective] disorder: Secondary | ICD-10-CM

## 2022-08-25 DIAGNOSIS — I132 Hypertensive heart and chronic kidney disease with heart failure and with stage 5 chronic kidney disease, or end stage renal disease: Secondary | ICD-10-CM | POA: Insufficient documentation

## 2022-08-25 DIAGNOSIS — F1910 Other psychoactive substance abuse, uncomplicated: Secondary | ICD-10-CM

## 2022-08-25 DIAGNOSIS — N186 End stage renal disease: Secondary | ICD-10-CM

## 2022-08-25 DIAGNOSIS — I5023 Acute on chronic systolic (congestive) heart failure: Secondary | ICD-10-CM | POA: Diagnosis not present

## 2022-08-25 DIAGNOSIS — Z992 Dependence on renal dialysis: Secondary | ICD-10-CM

## 2022-08-25 DIAGNOSIS — D631 Anemia in chronic kidney disease: Secondary | ICD-10-CM

## 2022-08-25 DIAGNOSIS — I5020 Unspecified systolic (congestive) heart failure: Secondary | ICD-10-CM

## 2022-08-25 DIAGNOSIS — I3139 Other pericardial effusion (noninflammatory): Secondary | ICD-10-CM | POA: Diagnosis not present

## 2022-08-25 LAB — ECHOCARDIOGRAM COMPLETE
AR max vel: 1.92 cm2
AV Area VTI: 1.91 cm2
AV Area mean vel: 1.9 cm2
AV Mean grad: 13 mmHg
AV Peak grad: 24.4 mmHg
Ao pk vel: 2.47 m/s
Area-P 1/2: 6.48 cm2
Height: 68 in
MV M vel: 6.06 m/s
MV Peak grad: 146.9 mmHg
MV VTI: 1.72 cm2
S' Lateral: 4.1 cm
Single Plane A4C EF: 46.7 %
Weight: 3044.11 oz

## 2022-08-25 LAB — RENAL FUNCTION PANEL
Albumin: 2.5 g/dL — ABNORMAL LOW (ref 3.5–5.0)
Anion gap: 14 (ref 5–15)
BUN: 49 mg/dL — ABNORMAL HIGH (ref 6–20)
CO2: 24 mmol/L (ref 22–32)
Calcium: 9.8 mg/dL (ref 8.9–10.3)
Chloride: 100 mmol/L (ref 98–111)
Creatinine, Ser: 8.48 mg/dL — ABNORMAL HIGH (ref 0.44–1.00)
GFR, Estimated: 6 mL/min — ABNORMAL LOW (ref >60)
Glucose, Bld: 104 mg/dL — ABNORMAL HIGH (ref 70–99)
Phosphorus: 6.5 mg/dL — ABNORMAL HIGH (ref 2.5–4.6)
Potassium: 4 mmol/L (ref 3.5–5.1)
Sodium: 138 mmol/L (ref 135–145)

## 2022-08-25 LAB — CBC
HCT: 26.4 % — ABNORMAL LOW (ref 36.0–46.0)
Hemoglobin: 7.9 g/dL — ABNORMAL LOW (ref 12.0–15.0)
MCH: 30.2 pg (ref 26.0–34.0)
MCHC: 29.9 g/dL — ABNORMAL LOW (ref 30.0–36.0)
MCV: 100.8 fL — ABNORMAL HIGH (ref 80.0–100.0)
Platelets: 307 10*3/uL (ref 150–400)
RBC: 2.62 MIL/uL — ABNORMAL LOW (ref 3.87–5.11)
RDW: 16 % — ABNORMAL HIGH (ref 11.5–15.5)
WBC: 8.2 10*3/uL (ref 4.0–10.5)
nRBC: 0 % (ref 0.0–0.2)

## 2022-08-25 LAB — HEPATITIS B SURFACE ANTIBODY, QUANTITATIVE: Hep B S AB Quant (Post): >1000 m[IU]/mL (ref >9.9)

## 2022-08-25 LAB — SEDIMENTATION RATE: Sed Rate: 87 mm/hr — ABNORMAL HIGH (ref 0–22)

## 2022-08-25 LAB — C-REACTIVE PROTEIN: CRP: 10.8 mg/dL — ABNORMAL HIGH (ref <1.0)

## 2022-08-25 MED ORDER — CALCIUM ACETATE (PHOS BINDER) 667 MG PO CAPS
1334.0000 mg | ORAL_CAPSULE | Freq: Three times a day (TID) | ORAL | Status: DC
  Administered 2022-08-25 – 2022-08-28 (×6): 1334 mg via ORAL
  Filled 2022-08-25 (×7): qty 2

## 2022-08-25 MED ORDER — HYDROXYZINE HCL 10 MG PO TABS
10.0000 mg | ORAL_TABLET | ORAL | Status: AC | PRN
  Administered 2022-08-25: 10 mg via ORAL
  Filled 2022-08-25: qty 1

## 2022-08-25 MED ORDER — DOXYCYCLINE HYCLATE 100 MG PO TABS
100.0000 mg | ORAL_TABLET | Freq: Two times a day (BID) | ORAL | Status: DC
  Administered 2022-08-25 – 2022-08-28 (×7): 100 mg via ORAL
  Filled 2022-08-25 (×7): qty 1

## 2022-08-25 NOTE — ED Notes (Signed)
Pt very disagreeable and refuses echo this morning as she wants to eat instead. Pt also refusing her heparin injection.

## 2022-08-25 NOTE — Progress Notes (Signed)
[facility] Progress Note   Subjective: Seen in room, sitting up in middle of bed with legs crossed. She actually looks better than I've ever seen her look. She says she is having "a little chest pain" localizes pain to R upper chest, describes as sharp, non-radiating. Rates as 7/10 however face is relaxed, smiling. Will notify primary. BP slightly elevated   Patient says she has no home. Says she should have never left [place]. OP CLIP in progress.   Objective Vitals:   08/25/22 0500 08/25/22 0645 08/25/22 1036 08/25/22 1127  BP: (!) 159/107 (!) 112/99 (!) 146/97 (!) 149/96  Pulse:   (!) 102   Resp: (!) 32 (!) 24 20   Temp:   98.3 F (36.8 C) 98.7 F (37.1 C)  TempSrc:   Oral Oral  SpO2:  100% 100%   Weight:    86.3 kg  Height:    5\' 8"  (1.727 m)   Physical Exam General: WN,WD female in NAD Heart: S1,S2 RRR SR on monitor Lungs: CTAB Abdomen:NABs, NT Extremities: Very trace LE edema Dialysis Access: L AVF + T/B    Additional Objective Labs: Basic Metabolic Panel: Recent Labs  Lab 08/23/22 2123 08/24/22 1024 08/25/22 0416  NA 141 139 138  K 5.0 4.7 4.0  CL 102 102 100  CO2 22 21* 24  GLUCOSE 96 130* 104*  BUN 70* 76* 49*  CREATININE 10.99* 11.66* 8.48*  CALCIUM 10.2 9.6 9.8  PHOS  --  8.6* 6.5*   Liver Function Tests: Recent Labs  Lab 08/24/22 1024 08/25/22 0416  ALBUMIN 2.7* 2.5*   No results for input(s): "LIPASE", "AMYLASE" in the last 168 hours. CBC: Recent Labs  Lab 08/23/22 2123 08/24/22 1024 08/25/22 0416  WBC 9.6 9.3 8.2  HGB 8.2* 7.8* 7.9*  HCT 26.7* 24.7* 26.4*  MCV 100.4* 99.2 100.8*  PLT 324 298 307   Blood Culture    Component Value Date/Time   SDES BLOOD BLOOD RIGHT HAND 05/05/2022 1233   SPECREQUEST  05/05/2022 1233    BOTTLES DRAWN AEROBIC AND ANAEROBIC Blood Culture adequate volume   CULT  05/05/2022 1233    NO GROWTH 5 DAYS Performed at [facility], [place] [address], [place], [place] [address]     REPTSTATUS 05/10/2022 FINAL 05/05/2022 1233    Cardiac Enzymes: No results for input(s): "CKTOTAL", "CKMB", "CKMBINDEX", "TROPONINI" in the last 168 hours. CBG: No results for input(s): "GLUCAP" in the last 168 hours. Iron Studies: No results for input(s): "IRON", "TIBC", "TRANSFERRIN", "FERRITIN" in the last 72 hours. @lablastinr3 @ Studies/Results: DG Wrist 2 Views Left  Result Date: 08/24/2022 CLINICAL DATA:  Pain left wrist x2 weeks EXAM: LEFT WRIST - 2 VIEW COMPARISON:  None Available. FINDINGS: No recent displaced fracture or dislocation is seen. There is old healed fracture in the shaft of fifth metacarpal. Radiolucencies are seen in the lunate and scaphoid. There is narrowing of radiocarpal joint space. There are amorphous calcifications in the soft tissues along the medial and lateral aspect of the wrist and in the soft tissues between the first and second metacarpals. IMPRESSION: No recent displaced fracture or dislocation is seen. Radiolucencies are seen in lunate and scaphoid along with joint space narrowing in the radiocarpal joint. Findings may be due to degenerative arthritis. Possibility of infectious process in the lunate and scaphoid is not excluded. Amorphous soft tissue calcifications are seen adjacent to the wrist and in the soft tissues between first and second metacarpals. This may suggest old soft  tissue injury or residual changes from surgery. Electronically Signed   By: Elmer Picker M.D.   On: 08/24/2022 16:32   DG Chest 2 View  Result Date: 08/23/2022 CLINICAL DATA:  Chest pain EXAM: CHEST - 2 VIEW COMPARISON:  06/30/2022 FINDINGS: Lung volumes are small. Small bilateral pleural effusions are present. Right basilar peripheral opacity may represent a peripherally loculated pleural fluid component. Patchy atelectasis or infiltrate is seen at the left lung base. No pneumothorax. Mild cardiomegaly is stable. Perihilar interstitial pulmonary edema persists in keeping with  mild cardiogenic failure. IMPRESSION: 1. Mild cardiogenic failure with small bilateral pleural effusions. 2. Left basilar atelectasis or infiltrate. 3. Peripheral right basilar opacity possibly representing a loculated pleural fluid component. Electronically Signed   By: Fidela Salisbury M.D.   On: 08/23/2022 21:46   Medications:  cefTRIAXone (ROCEPHIN)  IV Stopped (08/24/22 1751)    calcitRIOL  0.25 mcg Oral Q T,Th,Sa-HD   calcium acetate  667 mg Oral TID WC   carvedilol  6.25 mg Oral BID WC   Chlorhexidine Gluconate Cloth  6 each Topical Q0600   doxycycline  100 mg Oral Q12H   heparin  5,000 Units Subcutaneous Q8H   sodium chloride flush  3 mL Intravenous Q12H   No HD center  Assessment/Plan: 1 Chest pain: trops flat, EKG unremarkable. Cardiology following.  2 ESRD:  ESRD MWF [facility]- will provide HD here today.  Notify renal navigator of need for CLIP. Next HD 08/26/2022 3 Hypertension:  has been out of meds x 3 weeks- put back on home meds and expect improvement with improvement in vol status. UF as tolerated.  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 Tsat is 53%. Start ESA with HD 08/26/2022 5. Metabolic Bone Disease:  PO4 slightly elevated, continue binders. Low dose VDRA. Check PTH with HD tomorrow.  6.  Dispo: pending    Raymond Azure H. Brenna Friesenhahn NP-C 08/25/2022, 2:04 PM  [employer] [phone]

## 2022-08-25 NOTE — Progress Notes (Signed)
[name] referral to [facility] admissions for out-pt HD placement is still pending. Will continue to follow and await final approval/acceptance.   Melven Sartorius Renal Navigator [phone]

## 2022-08-25 NOTE — ED Notes (Signed)
Patient turns her monitor off even though she has been advised to not interfere with the monitors.  She states the alarms bother her.

## 2022-08-25 NOTE — Progress Notes (Addendum)
PROGRESS NOTE    Adriona Kaney  NUU:[id number] DOB: Mar 19, 1996 DOA: 08/23/2022 PCP: [name], MD    Brief Narrative:   Maryn Freelove is a 26 y.o. female with past medical history significant of ESRD secondary to nephrotic syndrome on HD(TTS), HTN, asthma, septic arthritis, polysubstance abuse, syphilis/gonorrhea, and mood disorder presented to hospital with shortness of breath and chest pain for 2 to 3 weeks.  She also had productive cough, leg swelling orthopnea insomnia nausea vomiting and left wrist pain.  Patient reported that she moved to [facility] from [place] and last received dialysis on 9/22 in [place].  She had missed least 1 session of hemodialysis last week.  Reported the need for establishing care with hemodialysis center in [place]. Patient has been smoking cigarettes marijuana and cocaine.  She reported being seen in the ED several times in the last month.    On this admission in the ED patient was tachycardic with mild tachypnea with elevated blood pressure at 164/120.  Hemoglobin was 8.2 with potassium of 5.0.  Creatinine 10.9.  Anion gap was 17.  Troponins were elevated at 399 and 326.  Chest x-ray showed some small pleural effusion and congestion.  Patient was then admitted hospital for further evaluation and treatment.    Assessment and Plan: Principal Problem:   Chest pain Active Problems:   Elevated troponin   ESRD on dialysis (HCC)   Pericardial effusion without cardiac tamponade   Heart failure with reduced ejection fraction (HCC)   Abnormal chest x-ray   Anemia in chronic kidney disease   Mood disorder (HCC)   Polysubstance abuse (HCC)   Left wrist pain   Acute on chronic systolic heart failure (HCC)   Chest pain elevated troponin EKG without ischemic changes.  Downtrending troponins.  Seen by cardiology.  No further ischemic work-up planned.  Probably demand ischemia in the setting of volume overload and recent cocaine usage.   Continue to monitor in telemetry.-Pending 2D echocardiogram.  Likely chest pain from coughing efforts.  Fluid overload ESRD on HD Chest x-ray noted mild cardiogenic failure with bilateral pleural effusions.  Patient noted missing at the 1 session last week, but last reports dialyzed on 9/22.  Nephrology on board for hemodialysis needs.  Will need hemodialysis set up locally in this area.  Renal navigator has been involved  Acute on chronic heart failure with reduced EF pericardial effusion Presenting with orthopnea lower extremity edema.  Recent echocardiogram from 04/2022 revealed EF of 45- 50% with moderate pericardial effusion measuring 1.1 cm adjacent to left ventricular lateral wall.  Repeat 2D echocardiogram has been sent.  Cough abnormal chest x-ray X-ray of the chest with some atelectasis/infiltrate possibly effusion.-Procalcitonin 0.9.  Has been started on Rocephin and Doxycycline empirically.   Anemia of chronic kidney disease/ESRD Hemoglobin 8.2 g/dL presentation.  Hemoglobin today at 7.9.  Continue to monitor closely.  Likely secondary to chronic kidney disease   Mood disorder Review of records mention of PTSD , bipolar disorder, borderline personality disorder, major depression, and schizoaffective disorder .  Patient was supposed to be on Abilify but is not taking it.  On IV Ativan as needed for anxiety  Accelerated hypertension on presentation.  Was off antihypertensive medications for few weeks now.  Will need to restart.  Polysubstance abuse Including cocaine and marijuana and cigarette smoking.  Counseled against it.   Left wrist pain. Acute on chronic.   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  X-ray of the left wrist with possible degenerative arthritis.  Continue supportive care with local analgesic cream.  Currently on wrist brace will  continue.  Concern for STDs.  Patient states that she feels like she could be having actually transmitted disease and wishes to be tested for it.  We will order GC and chlamydia urinary probe.  HIV done 3 months ago was nonreactive.  Homelessness.  TOC has been consulted.   DVT prophylaxis: heparin injection 5,000 Units Start: 08/24/22 0830   Code Status:     Code Status: Full Code  Disposition: Home Status is: Inpatient  Remains inpatient appropriate because: Pain, fluid overload, need for hemodialysis set up locally, homelessness   Family Communication: None at bedside  Consultants:  Nephrology Cardiology  Procedures:  Hemodialysis  Antimicrobials:  Rocephin and doxycycline  Anti-infectives (From admission, onward)    Start     Dose/Rate Route Frequency Ordered Stop   08/24/22 1700  cefTRIAXone (ROCEPHIN) 2 g in sodium chloride 0.9 % 100 mL IVPB        2 g 200 mL/hr over 30 Minutes Intravenous Every 24 hours 08/24/22 1528     08/24/22 1700  doxycycline (VIBRAMYCIN) 100 mg in sodium chloride 0.9 % 250 mL IVPB        100 mg 125 mL/hr over 120 Minutes Intravenous Every 12 hours 08/24/22 1528          Subjective: Today, patient was seen and examined at bedside.  Complains of chest discomfort cough.  Denies any nausea vomiting fever or chills.  States that she might have got STDs and has some vaginal discharge and wishes to be tested for it.  Objective: Vitals:   08/25/22 0300 08/25/22 0400 08/25/22 0500 08/25/22 0645  BP: (!) 162/114 (!) 154/108 (!) 159/107 (!) 112/99  Pulse:      Resp: (!) 30 (!) 32 (!) 32 (!) 24  Temp:  98.4 F (36.9 C)    TempSrc:  Oral    SpO2: 100% 100%  100%    Intake/Output Summary (Last 24 hours) at 08/25/2022 0804 Last data filed at 08/25/2022 2952 Gross per 24 hour  Intake 600 ml  Output 3000 ml  Net -2400 ml   Filed Weights    Physical Examination: Body mass index is 28.93 kg/m.  General:  Average built, not in obvious  distress HENT:   No scleral pallor or icterus noted. Oral mucosa is moist.  Chest:    Diminished breath sounds bilaterally. No crackles or wheezes.  CVS: S1 &S2 heard. No murmur.  Regular rate and rhythm. Abdomen: Soft, nontender, nondistended.  Bowel sounds are heard.   Extremities: No cyanosis, clubbing or edema.  Peripheral pulses are palpable.  Left upper extremity graft Psych: Alert, awake and oriented, normal mood CNS:  No cranial nerve deficits.  Power equal in all extremities.   Skin: Warm and dry.  No rashes noted.  Data Reviewed:   CBC: Recent Labs  Lab 08/23/22 2123 08/24/22 1024 08/25/22 0416  WBC 9.6 9.3 8.2  HGB 8.2* 7.8* 7.9*  HCT 26.7* 24.7* 26.4*  MCV 100.4* 99.2 100.8*  PLT 324 298 841    Basic Metabolic Panel: Recent Labs  Lab 08/23/22 2123 08/24/22 1024 08/25/22 0416  NA 141 139 138  K 5.0 4.7 4.0  CL 102 102 100  CO2 22 21* 24  GLUCOSE 96 130* 104*  BUN 70* 76* 49*  CREATININE 10.99* 11.66* 8.48*  CALCIUM 10.2 9.6 9.8  PHOS  --  8.6* 6.5*    Liver Function Tests: Recent Labs  Lab 08/24/22 1024 08/25/22 0416  ALBUMIN 2.7* 2.5*     Radiology Studies: DG Wrist 2 Views Left  Result Date: 08/24/2022 CLINICAL DATA:  Pain left wrist x2 weeks EXAM: LEFT WRIST - 2 VIEW COMPARISON:  None Available. FINDINGS: No recent displaced fracture or dislocation is seen. There is old healed fracture in the shaft of fifth metacarpal. Radiolucencies are seen in the lunate and scaphoid. There is narrowing of radiocarpal joint space. There are amorphous calcifications in the soft tissues along the medial and lateral aspect of the wrist and in the soft tissues between the first and second metacarpals. IMPRESSION: No recent displaced fracture or dislocation is seen. Radiolucencies are seen in lunate and scaphoid along with joint space narrowing in the radiocarpal joint. Findings may be due to degenerative arthritis. Possibility of infectious process in the lunate and  scaphoid is not excluded. Amorphous soft tissue calcifications are seen adjacent to the wrist and in the soft tissues between first and second metacarpals. This may suggest old soft tissue injury or residual changes from surgery. Electronically Signed   By: Elmer Picker M.D.   On: 08/24/2022 16:32   DG Chest 2 View  Result Date: 08/23/2022 CLINICAL DATA:  Chest pain EXAM: CHEST - 2 VIEW COMPARISON:  06/30/2022 FINDINGS: Lung volumes are small. Small bilateral pleural effusions are present. Right basilar peripheral opacity may represent a peripherally loculated pleural fluid component. Patchy atelectasis or infiltrate is seen at the left lung base. No pneumothorax. Mild cardiomegaly is stable. Perihilar interstitial pulmonary edema persists in keeping with mild cardiogenic failure. IMPRESSION: 1. Mild cardiogenic failure with small bilateral pleural effusions. 2. Left basilar atelectasis or infiltrate. 3. Peripheral right basilar opacity possibly representing a loculated pleural fluid component. Electronically Signed   By: Fidela Salisbury M.D.   On: 08/23/2022 21:46      LOS: 1 day    [name], MD [facility] Available via Epic secure chat 7am-7pm After these hours, please refer to coverage provider listed on [email or web address] 08/25/2022, 8:04 AM

## 2022-08-25 NOTE — TOC Progression Note (Signed)
Transition of Care [facility]) - Progression Note    Patient Details  Name: Wilfred Siverson MRN: [id number] Date of Birth: 1996/03/14  [facility] CM/SW Contact  Zenon Mayo, RN Phone Number: 08/25/2022, 5:01 PM  Clinical Narrative:    homeless, shelter, chest pain, ESRD, HTN, Anemia, metabolic bone disease. Being clipped.  TOC following.        Expected Discharge Plan and Services                                                 Social Determinants of Health (SDOH) Interventions    Readmission Risk Interventions    06/29/2022   11:17 AM  Readmission Risk Prevention Plan  Transportation Screening Complete  Medication Review (RN Care Manager) Complete  PCP or Specialist appointment within 3-5 days of discharge Complete  HRI or [place] Complete  SW Recovery Care/Counseling Consult Complete  [place] Not Applicable

## 2022-08-25 NOTE — Progress Notes (Signed)
Orthopedic Tech Progress Note Patient Details:  Natasha Long 12-May-1996 [id number]  Ortho Devices Type of Ortho Device: Velcro wrist splint Ortho Device/Splint Location: lue Ortho Device/Splint Interventions: Ordered, Application, Adjustment  I got clarification from the dr what kind of splint they wanted. They said velcro was ok.  Post Interventions Patient Tolerated: Well Instructions Provided: Care of device, Adjustment of device  Karolee Stamps 08/25/2022, 7:03 AM

## 2022-08-25 NOTE — ED Notes (Signed)
Developed significant cough.

## 2022-08-25 NOTE — Progress Notes (Addendum)
Rounding Note    Patient Name: Natasha Long Date of Encounter: 08/25/2022  [place] Cardiologist: Sinclair Grooms, MD   Subjective   Patient is much more awake and alert on my exam today.  She reports an improvement in shortness of breath and chest pain today.  Still continues with a frequent productive cough that she reports exacerbates her chest pain.  Patient confirms today that she is actually missed multiple dialysis sessions in the past several weeks.  Patient acknowledges that this is likely the reason that she has been so symptomatic of late.  Patient was scheduled to have an echocardiogram this morning but reportedly declined.  Discussed the need for this exam with patient in order to monitor her pericardial effusion and patient is agreeable to reattempt this scan if she receives something for anxiety.  Inpatient Medications    Scheduled Meds:  calcitRIOL  0.25 mcg Oral Q T,Th,Sa-HD   calcium acetate  667 mg Oral TID WC   carvedilol  6.25 mg Oral BID WC   Chlorhexidine Gluconate Cloth  6 each Topical Q0600   heparin  5,000 Units Subcutaneous Q8H   sodium chloride flush  3 mL Intravenous Q12H   Continuous Infusions:  cefTRIAXone (ROCEPHIN)  IV Stopped (08/24/22 1751)   doxycycline (VIBRAMYCIN) IV Stopped (08/25/22 3662)   PRN Meds: acetaminophen **OR** acetaminophen, albuterol, benzonatate, diclofenac Sodium, heparin, HYDROcodone bit-homatropine, LORazepam, ondansetron **OR** ondansetron (ZOFRAN) IV   Vital Signs    Vitals:   08/25/22 0300 08/25/22 0400 08/25/22 0500 08/25/22 0645  BP: (!) 162/114 (!) 154/108 (!) 159/107 (!) 112/99  Pulse:      Resp: (!) 30 (!) 32 (!) 32 (!) 24  Temp:  98.4 F (36.9 C)    TempSrc:  Oral    SpO2: 100% 100%  100%    Intake/Output Summary (Last 24 hours) at 08/25/2022 0851 Last data filed at 08/25/2022 9476 Gross per 24 hour  Intake 600 ml  Output 3000 ml  Net -2400 ml      07/02/2022    7:33 AM 07/01/2022     1:07 PM  Last 3 Weights  Weight (lbs) 204 lb 12.9 oz 214 lb 1.1 oz  Weight (kg) 92.9 kg 97.1 kg      Telemetry    Sinus rhythm- Personally Reviewed  ECG    No ECG today  Physical Exam   GEN: No acute distress.   Neck: JVD midway to mandible Cardiac: RRR, no murmurs, rubs, or gallops.  Respiratory: Bilaterally rhonchorous.  Diminished in bilateral bases GI: Soft, nontender, non-distended  MS: 1+ bilateral lower extremity edema; No deformity. Neuro:  Nonfocal  Psych: Normal affect   Labs    High Sensitivity Troponin:   Recent Labs  Lab 08/23/22 2123 08/24/22 0440  TROPONINIHS 399* 326*     Chemistry Recent Labs  Lab 08/23/22 2123 08/24/22 1024 08/25/22 0416  NA 141 139 138  K 5.0 4.7 4.0  CL 102 102 100  CO2 22 21* 24  GLUCOSE 96 130* 104*  BUN 70* 76* 49*  CREATININE 10.99* 11.66* 8.48*  CALCIUM 10.2 9.6 9.8  ALBUMIN  --  2.7* 2.5*  GFRNONAA 4* 4* 6*  ANIONGAP 17* 16* 14    Lipids No results for input(s): "CHOL", "TRIG", "HDL", "LABVLDL", "LDLCALC", "CHOLHDL" in the last 168 hours.  Hematology Recent Labs  Lab 08/23/22 2123 08/24/22 1024 08/25/22 0416  WBC 9.6 9.3 8.2  RBC 2.66* 2.49* 2.62*  HGB 8.2* 7.8* 7.9*  HCT 26.7* 24.7* 26.4*  MCV 100.4* 99.2 100.8*  MCH 30.8 31.3 30.2  MCHC 30.7 31.6 29.9*  RDW 16.0* 16.1* 16.0*  PLT 324 298 307   Thyroid No results for input(s): "TSH", "FREET4" in the last 168 hours.  BNP Recent Labs  Lab 08/24/22 1024  BNP 3,410.3*    DDimer No results for input(s): "DDIMER" in the last 168 hours.   Radiology    DG Wrist 2 Views Left  Result Date: 08/24/2022 CLINICAL DATA:  Pain left wrist x2 weeks EXAM: LEFT WRIST - 2 VIEW COMPARISON:  None Available. FINDINGS: No recent displaced fracture or dislocation is seen. There is old healed fracture in the shaft of fifth metacarpal. Radiolucencies are seen in the lunate and scaphoid. There is narrowing of radiocarpal joint space. There are amorphous calcifications in  the soft tissues along the medial and lateral aspect of the wrist and in the soft tissues between the first and second metacarpals. IMPRESSION: No recent displaced fracture or dislocation is seen. Radiolucencies are seen in lunate and scaphoid along with joint space narrowing in the radiocarpal joint. Findings may be due to degenerative arthritis. Possibility of infectious process in the lunate and scaphoid is not excluded. Amorphous soft tissue calcifications are seen adjacent to the wrist and in the soft tissues between first and second metacarpals. This may suggest old soft tissue injury or residual changes from surgery. Electronically Signed   By: Elmer Picker M.D.   On: 08/24/2022 16:32   DG Chest 2 View  Result Date: 08/23/2022 CLINICAL DATA:  Chest pain EXAM: CHEST - 2 VIEW COMPARISON:  06/30/2022 FINDINGS: Lung volumes are small. Small bilateral pleural effusions are present. Right basilar peripheral opacity may represent a peripherally loculated pleural fluid component. Patchy atelectasis or infiltrate is seen at the left lung base. No pneumothorax. Mild cardiomegaly is stable. Perihilar interstitial pulmonary edema persists in keeping with mild cardiogenic failure. IMPRESSION: 1. Mild cardiogenic failure with small bilateral pleural effusions. 2. Left basilar atelectasis or infiltrate. 3. Peripheral right basilar opacity possibly representing a loculated pleural fluid component. Electronically Signed   By: Fidela Salisbury M.D.   On: 08/23/2022 21:46    Cardiac Studies   05/03/2022 TTE (limited)   IMPRESSIONS     1. Left ventricular ejection fraction, by estimation, is 45 to 50%. The  left ventricle has mildly decreased function.   2. Right ventricular systolic function is normal. The right ventricular  size is normal.   3. The inferior vena cava is dilated in size with <50% respiratory  variability, suggesting right atrial pressure of 15 mmHg.   4. Moderate pericardial effusion,  measures 1.1 cm adjacent to LV lateral  wall. IVC fixed/dilated, but no RA/RV collapse seen to suggest tamponade   FINDINGS   Left Ventricle: Left ventricular ejection fraction, by estimation, is 45  to 50%. The left ventricle has mildly decreased function.   Right Ventricle: The right ventricular size is normal. Right ventricular  systolic function is normal.   Pericardium: A moderately sized pericardial effusion is present.   Venous: The inferior vena cava is dilated in size with less than 50%  respiratory variability, suggesting right atrial pressure of 15 mmHg.    Patient Profile     Natasha Long is a 26 y.o. female with a hx of ESRD (on dialysis) secondary to nephrotic syndrome, asthma, hypertension, septic arthritis, polysubstance abuse (most recently cocaine), syphilis and gonorrhea, mood disorder (PTSD, bipolar disorder, borderline personality disorder, major depression, schizoaffective all  listed) who is being seen 08/24/2022 for the evaluation of chest pain/elevated troponin at the request of Dr. Tamala Julian.  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 CXR with mild cardiogenic failure, bilateral small pleural effusions. Questionable right basilar loculated effusion. June 2023 echo with LVEF 45-50%, moderate pericardial effusion. This was medically managed. Patient actively being dialyzed on my exam today and was uncooperative with physical exam, difficult to assess JVD. 2+ peripheral edema noted. BNP elevated to 3410.3.   Patient completed HD yesterday, 3052mL removed. Suspect that patient's volume status is 2/2 to missed dialysis session(s) and being off of regular prescriptions. Patient is not on any GDMT, likely due to cocaine use, ESRD, and access issues. Coreg 6.25mg  BID initiated yesterday Repeat Echo pending. Patient refused this imaging this morning.  Discussed the need for this  exam and patient is agreeable to have this done.  Will order a one-time as needed dose of Atarax to assist with anxiety. Cessation of cocaine use strongly encouraged. Discussed with patient that this exacerbates HF.   Chest pain   Patient with chest pain on admission, troponin of 399 and 326. Strongly suspect pain and troponin leak are both secondary to demand ischemia in ESRD patient with missed dialysis, missing chronic medications, and cocaine use in the last 24 hours.    Given downtrending troponin and no acute ECG changes, no need for acute ischemic evaluation. Echo pending, if regional wall motion abnormalities, would be reasonable to consider a non-urgent stress test.   ESRD on HD   Hemodialysis per primary team/nephrology. Continue Calcitriol, [place], PhosLo.     Per primary team:   Cough Anemia of chronic disease Mood disorder       For questions or updates, please contact [place] Please consult [email or web address] for contact info under        Signed, Lily Kocher, PA-C  08/25/2022, 8:51 AM    Patient seen and examined.  Agree with above documentation.  On exam, patient is alert and oriented, regular rate and rhythm, no murmurs, lungs CTAB, trace LE edema.  Declined echocardiogram today, but is now agreeable.  Will f/u echo to assess her systolic function and monitor pericardial effusion.  Donato Heinz, MD

## 2022-08-25 NOTE — Progress Notes (Signed)
I found the patient's duffle bag in the emergency room and returned it to the patient.

## 2022-08-25 NOTE — Progress Notes (Addendum)
Patient arrived to room 3E20 in NAD, VS stable and patient free from pain. Patient oriented to room and call bell in reach.   Patient arrived with zero personal belongings. She states she had a whole suitcase with her in the ED that they could not find when she returned from dialysis.I have messaged two ER nurses, [facility] and [employer] and they are supposed to be checking on this for me.

## 2022-08-25 NOTE — Progress Notes (Signed)
  Echocardiogram 2D Echocardiogram has been performed.  Natasha Long 08/25/2022, 5:04 PM

## 2022-08-26 DIAGNOSIS — N186 End stage renal disease: Secondary | ICD-10-CM | POA: Diagnosis not present

## 2022-08-26 DIAGNOSIS — R778 Other specified abnormalities of plasma proteins: Secondary | ICD-10-CM | POA: Diagnosis not present

## 2022-08-26 DIAGNOSIS — I5023 Acute on chronic systolic (congestive) heart failure: Secondary | ICD-10-CM | POA: Diagnosis not present

## 2022-08-26 DIAGNOSIS — I340 Nonrheumatic mitral (valve) insufficiency: Secondary | ICD-10-CM | POA: Diagnosis not present

## 2022-08-26 DIAGNOSIS — I059 Rheumatic mitral valve disease, unspecified: Secondary | ICD-10-CM

## 2022-08-26 DIAGNOSIS — I3139 Other pericardial effusion (noninflammatory): Secondary | ICD-10-CM | POA: Diagnosis not present

## 2022-08-26 DIAGNOSIS — R079 Chest pain, unspecified: Secondary | ICD-10-CM | POA: Diagnosis not present

## 2022-08-26 LAB — RENAL FUNCTION PANEL
Albumin: 2.6 g/dL — ABNORMAL LOW (ref 3.5–5.0)
Anion gap: 14 (ref 5–15)
BUN: 68 mg/dL — ABNORMAL HIGH (ref 6–20)
CO2: 24 mmol/L (ref 22–32)
Calcium: 10.1 mg/dL (ref 8.9–10.3)
Chloride: 100 mmol/L (ref 98–111)
Creatinine, Ser: 10.02 mg/dL — ABNORMAL HIGH (ref 0.44–1.00)
GFR, Estimated: 5 mL/min — ABNORMAL LOW (ref >60)
Glucose, Bld: 106 mg/dL — ABNORMAL HIGH (ref 70–99)
Phosphorus: 7.2 mg/dL — ABNORMAL HIGH (ref 2.5–4.6)
Potassium: 3.9 mmol/L (ref 3.5–5.1)
Sodium: 138 mmol/L (ref 135–145)

## 2022-08-26 MED ORDER — ISOSORBIDE MONONITRATE ER 30 MG PO TB24
30.0000 mg | ORAL_TABLET | Freq: Every day | ORAL | Status: DC
  Administered 2022-08-26 – 2022-08-28 (×3): 30 mg via ORAL
  Filled 2022-08-26 (×3): qty 1

## 2022-08-26 MED ORDER — HYDROXYZINE HCL 25 MG PO TABS
25.0000 mg | ORAL_TABLET | Freq: Three times a day (TID) | ORAL | Status: DC | PRN
  Administered 2022-08-26 – 2022-08-28 (×6): 25 mg via ORAL
  Filled 2022-08-26 (×6): qty 1

## 2022-08-26 MED ORDER — HYDRALAZINE HCL 25 MG PO TABS
25.0000 mg | ORAL_TABLET | Freq: Three times a day (TID) | ORAL | Status: DC
  Administered 2022-08-26 – 2022-08-28 (×6): 25 mg via ORAL
  Filled 2022-08-26 (×7): qty 1

## 2022-08-26 MED ORDER — HEPARIN SODIUM (PORCINE) 1000 UNIT/ML DIALYSIS
5000.0000 [IU] | Freq: Once | INTRAMUSCULAR | Status: AC
  Administered 2022-08-26: 5000 [IU] via INTRAVENOUS_CENTRAL
  Filled 2022-08-26 (×2): qty 5

## 2022-08-26 NOTE — Progress Notes (Signed)
PROGRESS NOTE    Natasha Long  VPX:[id number] DOB: Feb 07, 1996 DOA: 08/23/2022 PCP: [name], MD   Brief Narrative: No notes on file   Assessment and Plan:  Chest pain EKG without ischemic changes. Troponin of 399 > 326. Cardiology consulted. Transthoracic Echocardiogram significant for an LVEF of 45-50%. Presumed secondary to fluid overload. Cardiology with recommendations for continued volume management with HD.  Demand ischemia Per cardiology, likely secondary to fluid overload.  ESRD on HD Nephrology consulted and are performing intermittent hemodialysis on MWF  Chronic pericardial effusion Presumed secondary to fluid overload from missed hemodialysis, although imaging suggests effusion is chronic from March 2023. Cardiology recommending continued hemodialysis and repeat limited Transthoracic Echocardiogram once more euvolemic.  Acute on chronic HFrEF Fluid overload Secondary to missed hemodialysis. Management via hemodialysis.  Anemia of chronic kidney disease Stable.  Acute bronchitis Chest x-ray concerning for possible infiltrate vs atelectasis and patient has associated cough. Procalcitonin of 0.9. Patient started empirically on Ceftriaxone and doxycycline.  Uncontrolled hypertension -Continue Coreg, hydralazine, Imdur  Polysubstance abuse Patient uses marijuana, cocaine and tobacco. Patient counseled.  Left wrist pain History of septic arthritis. X-ray imaging now shows possible degenerative arthritis. Supportive care initiated with topical analgesics and wrist brace.  High risk sexual activity Patient with concern for possible STI infection. GC/chlamydia ordered. Recent HIV testing three months prior was non-reactive.  Homelessness Complicates discharge. TOC consulted for resources.   DVT prophylaxis: Heparin subq Code Status:   Code Status: Full Code Family Communication: None at bedside Disposition Plan: Discharge pending nephrology  recommendations   Consultants:  Nephrology Cardiology  Procedures:  Hemodialysis Transthoracic Echocardiogram  Antimicrobials: Ceftriaxone Doxycycline    Subjective: Patient declined to speak to me. Patient seen in hemodialysis.  Objective: BP (!) 159/97   Pulse (!) 101   Temp 98.7 F (37.1 C)   Resp (!) 28   Ht 5\' 8"  (1.727 m)   Wt 88.7 kg   SpO2 93%   BMI 29.73 kg/m   Examination:  General exam: Appears calm and comfortable Respiratory system: Clear to auscultation. Respiratory effort normal. Cardiovascular system: S1 & S2 heard, RRR. Systolic murmur Gastrointestinal system: Abdomen is nondistended, soft and nontender. Normal bowel sounds heard. Central nervous system: Alert. Musculoskeletal: 1+ pitting edema. No calf tenderness Skin: No cyanosis. No rashes   Data Reviewed: I have personally reviewed following labs and imaging studies  CBC Lab Results  Component Value Date   WBC 8.2 08/25/2022   RBC 2.62 (L) 08/25/2022   HGB 7.9 (L) 08/25/2022   HCT 26.4 (L) 08/25/2022   MCV 100.8 (H) 08/25/2022   MCH 30.2 08/25/2022   PLT 307 08/25/2022   MCHC 29.9 (L) 08/25/2022   RDW 16.0 (H) 08/25/2022   LYMPHSABS 1.4 06/30/2022   MONOABS 0.6 06/30/2022   EOSABS 0.2 06/30/2022   BASOSABS 0.0 46/27/0350     Last metabolic panel Lab Results  Component Value Date   NA 138 08/26/2022   K 3.9 08/26/2022   CL 100 08/26/2022   CO2 24 08/26/2022   BUN 68 (H) 08/26/2022   CREATININE 10.02 (H) 08/26/2022   GLUCOSE 106 (H) 08/26/2022   GFRNONAA 5 (L) 08/26/2022   GFRAA 27 (L) 01/30/2020   CALCIUM 10.1 08/26/2022   PHOS 7.2 (H) 08/26/2022   PROT 6.8 06/26/2022   ALBUMIN 2.6 (L) 08/26/2022   LABGLOB 2.7 01/30/2020   AGRATIO 0.9 (L) 01/30/2020   BILITOT 0.4 06/26/2022   ALKPHOS 107 06/26/2022   AST 59 (H)  06/26/2022   ALT 47 (H) 06/26/2022   ANIONGAP 14 08/26/2022    GFR: Estimated Creatinine Clearance: 9.7 mL/min (A) (by C-G formula based on SCr of  10.02 mg/dL (H)).  No results found for this or any previous visit (from the past 240 hour(s)).    Radiology Studies: ECHOCARDIOGRAM COMPLETE  Result Date: 08/25/2022    ECHOCARDIOGRAM REPORT   Patient Name:   Natasha Long Date of Exam: 08/25/2022 Medical Rec #:  [id number]         Height:       68.0 in Accession #:    [id number]        Weight:       190.3 lb Date of Birth:  1996-05-25         BSA:          2.001 m Patient Age:    26 years          BP:           149/96 mmHg Patient Gender: F                 HR:           106 bpm. Exam Location:  Inpatient Procedure: 2D Echo, 3D Echo, Cardiac Doppler and Color Doppler Indications:    Chest pain  History:        Patient has prior history of Echocardiogram examinations.                 Signs/Symptoms:Chest Pain; Risk Factors:Hypertension. CKD.                 Polysubstance abuse.  Sonographer:    Clayton Lefort RDCS (AE) Referring Phys: [id number] RONDELL A SMITH IMPRESSIONS  1. Left ventricular ejection fraction, by estimation, is 45 to 50%. Left ventricular ejection fraction by 3D volume is 44 %. The left ventricle has mildly decreased function. The left ventricle demonstrates global hypokinesis. The left ventricular internal cavity size was mildly dilated. There is severe concentric left ventricular hypertrophy. Left ventricular diastolic parameters are indeterminate.  2. Right ventricular systolic function is normal. The right ventricular size is moderately enlarged. There is severely elevated pulmonary artery systolic pressure.  3. Moderate pericardial effusion. The pericardial effusion is circumferential. Large pleural effusion in the left lateral region.  4. The mitral valve is degenerative. Severe mitral valve regurgitation.  5. The tricuspid valve is abnormal. 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  6. The aortic valve is abnormal. There is mild calcification of the aortic valve. Aortic valve regurgitation is not visualized. Aortic valve mean gradient  measures 13.0 mmHg. Comparison(s): LVEF similar to prior. Pericardial effusion is similar to prior. Valve disease has worsened. Limited repeat study when euvolemic would be reasonable. FINDINGS  Left Ventricle: Left ventricular ejection fraction, by estimation, is 45 to 50%. Left ventricular ejection fraction by 3D volume is 44 %. The left ventricle has mildly decreased function. The left ventricle demonstrates global hypokinesis. The left ventricular internal cavity size was mildly dilated. There is severe concentric left ventricular hypertrophy. Left ventricular diastolic parameters are indeterminate. Right Ventricle: The right ventricular size is moderately enlarged. No increase in right ventricular wall thickness. Right ventricular systolic function is normal. There is severely elevated pulmonary artery systolic pressure. The tricuspid regurgitant velocity is 3.54 m/s, and with an assumed right atrial pressure of 15 mmHg, the estimated right ventricular systolic pressure is 93.8 mmHg. Left Atrium: Left atrial size was normal in size. Right Atrium: Right atrial size was  normal in size. Pericardium: A moderately sized pericardial effusion is present. The pericardial effusion is circumferential. Mitral Valve: The mitral valve is degenerative in appearance. Severe mitral valve regurgitation. MV peak gradient, 15.8 mmHg. The mean mitral valve gradient is 7.0 mmHg with average heart rate of 105 bpm. Tricuspid Valve: The tricuspid valve is abnormal. 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 Aortic Valve: The aortic valve is abnormal. There is mild calcification of the aortic valve. Aortic valve regurgitation is not visualized. Aortic valve mean gradient measures 13.0 mmHg. Aortic valve peak gradient measures 24.4 mmHg. Aortic valve area, by  VTI measures 1.91 cm. Pulmonic Valve: The pulmonic valve was normal in structure. Pulmonic valve regurgitation is not visualized. No evidence of pulmonic stenosis. Aorta: The  aortic root and ascending aorta are structurally normal, with no evidence of dilitation. IAS/Shunts: No atrial level shunt detected by color flow Doppler. Additional Comments: There is a large pleural effusion in the left lateral region.  LEFT VENTRICLE PLAX 2D LVIDd:         5.40 cm         Diastology LVIDs:         4.10 cm         LV e' medial:    6.53 cm/s LV PW:         1.80 cm         LV E/e' medial:  25.9 LV IVS:        1.90 cm         LV e' lateral:   9.03 cm/s LVOT diam:     2.10 cm         LV E/e' lateral: 18.7 LV SV:         70 LV SV Index:   35 LVOT Area:     3.46 cm        3D Volume EF                                LV 3D EF:    Left                                             ventricul LV Volumes (MOD)                            ar LV vol d, MOD    154.0 ml                   ejection A4C:                                        fraction LV vol s, MOD    82.1 ml                    by 3D A4C:                                        volume is LV SV MOD A4C:   154.0 ml  44 %.                                 3D Volume EF:                                3D EF:        44 %                                LV EDV:       262 ml                                LV ESV:       146 ml                                LV SV:        115 ml RIGHT VENTRICLE             IVC RV Basal diam:  4.90 cm     IVC diam: 2.30 cm RV Mid diam:    4.10 cm RV S prime:     17.40 cm/s TAPSE (M-mode): 2.0 cm LEFT ATRIUM             Index        RIGHT ATRIUM           Index LA diam:        3.50 cm 1.75 cm/m   RA Area:     20.00 cm LA Vol (A2C):   61.0 ml 30.49 ml/m  RA Volume:   56.80 ml  28.39 ml/m LA Vol (A4C):   58.1 ml 29.04 ml/m LA Biplane Vol: 62.1 ml 31.04 ml/m  AORTIC VALVE AV Area (Vmax):    1.92 cm AV Area (Vmean):   1.90 cm AV Area (VTI):     1.91 cm AV Vmax:           247.00 cm/s AV Vmean:          170.000 cm/s AV VTI:            0.368 m AV Peak Grad:      24.4 mmHg AV Mean Grad:      13.0 mmHg LVOT Vmax:          137.00 cm/s LVOT Vmean:        93.500 cm/s LVOT VTI:          0.203 m LVOT/AV VTI ratio: 0.55  AORTA Ao Root diam: 3.00 cm Ao Asc diam:  3.20 cm MITRAL VALVE                TRICUSPID VALVE MV Area (PHT): 6.48 cm     TR Peak grad:   50.1 mmHg MV Area VTI:   1.72 cm     TR Vmax:        354.00 cm/s MV Peak grad:  15.8 mmHg MV Mean grad:  7.0 mmHg     SHUNTS MV Vmax:       1.99 m/s     Systemic VTI:  0.20 m MV Vmean:      128.0 cm/s   Systemic Diam: 2.10 cm MV Decel Time:  117 msec MR Peak grad: 146.9 mmHg MR Mean grad: 89.0 mmHg MR Vmax:      606.00 cm/s MR Vmean:     435.0 cm/s MV E velocity: 169.00 cm/s MV A velocity: 137.00 cm/s MV E/A ratio:  1.23 Rudean Haskell MD Electronically signed by Rudean Haskell MD Signature Date/Time: 08/25/2022/5:44:51 PM    Final    DG Wrist 2 Views Left  Result Date: 08/24/2022 CLINICAL DATA:  Pain left wrist x2 weeks EXAM: LEFT WRIST - 2 VIEW COMPARISON:  None Available. FINDINGS: No recent displaced fracture or dislocation is seen. There is old healed fracture in the shaft of fifth metacarpal. Radiolucencies are seen in the lunate and scaphoid. There is narrowing of radiocarpal joint space. There are amorphous calcifications in the soft tissues along the medial and lateral aspect of the wrist and in the soft tissues between the first and second metacarpals. IMPRESSION: No recent displaced fracture or dislocation is seen. Radiolucencies are seen in lunate and scaphoid along with joint space narrowing in the radiocarpal joint. Findings may be due to degenerative arthritis. Possibility of infectious process in the lunate and scaphoid is not excluded. Amorphous soft tissue calcifications are seen adjacent to the wrist and in the soft tissues between first and second metacarpals. This may suggest old soft tissue injury or residual changes from surgery. Electronically Signed   By: Elmer Picker M.D.   On: 08/24/2022 16:32      LOS: 2 days    Cordelia Poche,  MD [facility] 08/26/2022, 1:56 PM   If 7PM-7AM, please contact night-coverage [email or web address]

## 2022-08-26 NOTE — Procedures (Signed)
Patient seen and examined on Hemodialysis. BP (!) 155/99   Pulse 98   Temp 98.7 F (37.1 C)   Resp (!) 32   Ht 5\' 8"  (1.727 m)   Wt 88.7 kg   SpO2 90%   BMI 29.73 kg/m   QB 400 mL/ min via AVF, UF goal 4L  Tolerating treatment without complaints at this time.   Madelon Lips MD [facility] [phone] 10:13 AM

## 2022-08-26 NOTE — Progress Notes (Signed)
[facility] Progress Note   Subjective: Seen on dialysis.  Sleeping, briefly acknowledges me and goes back to sleep  Objective Vitals:   08/26/22 0810 08/26/22 0830 08/26/22 0900 08/26/22 0930  BP: (!) 154/105 (!) 157/103 (!) 150/98 (!) 155/99  Pulse: 100 (!) 102 100 98  Resp: 20 15 (!) 21 (!) 32  Temp: 98.7 F (37.1 C)     TempSrc:      SpO2: (!) 88% 90% 92% 90%  Weight: 88.7 kg     Height:       Physical Exam General: WN,WD female in NAD Heart: S1,S2 RRR Lungs: CTAB Abdomen:NABs, NT Extremities: Very trace LE edema Dialysis Access: L AVF + T/B    Additional Objective Labs: Basic Metabolic Panel: Recent Labs  Lab 08/23/22 2123 08/24/22 1024 08/25/22 0416  NA 141 139 138  K 5.0 4.7 4.0  CL 102 102 100  CO2 22 21* 24  GLUCOSE 96 130* 104*  BUN 70* 76* 49*  CREATININE 10.99* 11.66* 8.48*  CALCIUM 10.2 9.6 9.8  PHOS  --  8.6* 6.5*   Liver Function Tests: Recent Labs  Lab 08/24/22 1024 08/25/22 0416  ALBUMIN 2.7* 2.5*   No results for input(s): "LIPASE", "AMYLASE" in the last 168 hours. CBC: Recent Labs  Lab 08/23/22 2123 08/24/22 1024 08/25/22 0416  WBC 9.6 9.3 8.2  HGB 8.2* 7.8* 7.9*  HCT 26.7* 24.7* 26.4*  MCV 100.4* 99.2 100.8*  PLT 324 298 307   Blood Culture    Component Value Date/Time   SDES BLOOD BLOOD RIGHT HAND 05/05/2022 1233   SPECREQUEST  05/05/2022 1233    BOTTLES DRAWN AEROBIC AND ANAEROBIC Blood Culture adequate volume   CULT  05/05/2022 1233    NO GROWTH 5 DAYS Performed at [facility], [place] [address] [place], [place] [address]    REPTSTATUS 05/10/2022 FINAL 05/05/2022 1233    Cardiac Enzymes: No results for input(s): "CKTOTAL", "CKMB", "CKMBINDEX", "TROPONINI" in the last 168 hours. CBG: No results for input(s): "GLUCAP" in the last 168 hours. Iron Studies: No results for input(s): "IRON", "TIBC", "TRANSFERRIN", "FERRITIN" in the last 72 hours. @lablastinr3 @ Studies/Results: ECHOCARDIOGRAM  COMPLETE  Result Date: 08/25/2022    ECHOCARDIOGRAM REPORT   Patient Name:   Natasha Long Date of Exam: 08/25/2022 Medical Rec #:  [id number]         Height:       68.0 in Accession #:    [id number]        Weight:       190.3 lb Date of Birth:  May 28, 1996         BSA:          2.001 m Patient Age:    26 years          BP:           149/96 mmHg Patient Gender: F                 HR:           106 bpm. Exam Location:  Inpatient Procedure: 2D Echo, 3D Echo, Cardiac Doppler and Color Doppler Indications:    Chest pain  History:        Patient has prior history of Echocardiogram examinations.                 Signs/Symptoms:Chest Pain; Risk Factors:Hypertension. CKD.                 Polysubstance abuse.  Sonographer:    Clayton Lefort RDCS (AE) Referring Phys: [id number] RONDELL A SMITH IMPRESSIONS  1. Left ventricular ejection fraction, by estimation, is 45 to 50%. Left ventricular ejection fraction by 3D volume is 44 %. The left ventricle has mildly decreased function. The left ventricle demonstrates global hypokinesis. The left ventricular internal cavity size was mildly dilated. There is severe concentric left ventricular hypertrophy. Left ventricular diastolic parameters are indeterminate.  2. Right ventricular systolic function is normal. The right ventricular size is moderately enlarged. There is severely elevated pulmonary artery systolic pressure.  3. Moderate pericardial effusion. The pericardial effusion is circumferential. Large pleural effusion in the left lateral region.  4. The mitral valve is degenerative. Severe mitral valve regurgitation.  5. The tricuspid valve is abnormal. 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  6. The aortic valve is abnormal. There is mild calcification of the aortic valve. Aortic valve regurgitation is not visualized. Aortic valve mean gradient measures 13.0 mmHg. Comparison(s): LVEF similar to prior. Pericardial effusion is similar to prior. Valve disease has worsened. Limited  repeat study when euvolemic would be reasonable. FINDINGS  Left Ventricle: Left ventricular ejection fraction, by estimation, is 45 to 50%. Left ventricular ejection fraction by 3D volume is 44 %. The left ventricle has mildly decreased function. The left ventricle demonstrates global hypokinesis. The left ventricular internal cavity size was mildly dilated. There is severe concentric left ventricular hypertrophy. Left ventricular diastolic parameters are indeterminate. Right Ventricle: The right ventricular size is moderately enlarged. No increase in right ventricular wall thickness. Right ventricular systolic function is normal. There is severely elevated pulmonary artery systolic pressure. The tricuspid regurgitant velocity is 3.54 m/s, and with an assumed right atrial pressure of 15 mmHg, the estimated right ventricular systolic pressure is 43.3 mmHg. Left Atrium: Left atrial size was normal in size. Right Atrium: Right atrial size was normal in size. Pericardium: A moderately sized pericardial effusion is present. The pericardial effusion is circumferential. Mitral Valve: The mitral valve is degenerative in appearance. Severe mitral valve regurgitation. MV peak gradient, 15.8 mmHg. The mean mitral valve gradient is 7.0 mmHg with average heart rate of 105 bpm. Tricuspid Valve: The tricuspid valve is abnormal. 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 Aortic Valve: The aortic valve is abnormal. There is mild calcification of the aortic valve. Aortic valve regurgitation is not visualized. Aortic valve mean gradient measures 13.0 mmHg. Aortic valve peak gradient measures 24.4 mmHg. Aortic valve area, by  VTI measures 1.91 cm. Pulmonic Valve: The pulmonic valve was normal in structure. Pulmonic valve regurgitation is not visualized. No evidence of pulmonic stenosis. Aorta: The aortic root and ascending aorta are structurally normal, with no evidence of dilitation. IAS/Shunts: No atrial level shunt detected by color  flow Doppler. Additional Comments: There is a large pleural effusion in the left lateral region.  LEFT VENTRICLE PLAX 2D LVIDd:         5.40 cm         Diastology LVIDs:         4.10 cm         LV e' medial:    6.53 cm/s LV PW:         1.80 cm         LV E/e' medial:  25.9 LV IVS:        1.90 cm         LV e' lateral:   9.03 cm/s LVOT diam:     2.10 cm  LV E/e' lateral: 18.7 LV SV:         70 LV SV Index:   35 LVOT Area:     3.46 cm        3D Volume EF                                LV 3D EF:    Left                                             ventricul LV Volumes (MOD)                            ar LV vol d, MOD    154.0 ml                   ejection A4C:                                        fraction LV vol s, MOD    82.1 ml                    by 3D A4C:                                        volume is LV SV MOD A4C:   154.0 ml                   44 %.                                 3D Volume EF:                                3D EF:        44 %                                LV EDV:       262 ml                                LV ESV:       146 ml                                LV SV:        115 ml RIGHT VENTRICLE             IVC RV Basal diam:  4.90 cm     IVC diam: 2.30 cm RV Mid diam:    4.10 cm RV S prime:     17.40 cm/s TAPSE (M-mode): 2.0 cm LEFT ATRIUM             Index        RIGHT ATRIUM  Index LA diam:        3.50 cm 1.75 cm/m   RA Area:     20.00 cm LA Vol (A2C):   61.0 ml 30.49 ml/m  RA Volume:   56.80 ml  28.39 ml/m LA Vol (A4C):   58.1 ml 29.04 ml/m LA Biplane Vol: 62.1 ml 31.04 ml/m  AORTIC VALVE AV Area (Vmax):    1.92 cm AV Area (Vmean):   1.90 cm AV Area (VTI):     1.91 cm AV Vmax:           247.00 cm/s AV Vmean:          170.000 cm/s AV VTI:            0.368 m AV Peak Grad:      24.4 mmHg AV Mean Grad:      13.0 mmHg LVOT Vmax:         137.00 cm/s LVOT Vmean:        93.500 cm/s LVOT VTI:          0.203 m LVOT/AV VTI ratio: 0.55  AORTA Ao Root diam: 3.00 cm Ao Asc diam:   3.20 cm MITRAL VALVE                TRICUSPID VALVE MV Area (PHT): 6.48 cm     TR Peak grad:   50.1 mmHg MV Area VTI:   1.72 cm     TR Vmax:        354.00 cm/s MV Peak grad:  15.8 mmHg MV Mean grad:  7.0 mmHg     SHUNTS MV Vmax:       1.99 m/s     Systemic VTI:  0.20 m MV Vmean:      128.0 cm/s   Systemic Diam: 2.10 cm MV Decel Time: 117 msec MR Peak grad: 146.9 mmHg MR Mean grad: 89.0 mmHg MR Vmax:      606.00 cm/s MR Vmean:     435.0 cm/s MV E velocity: 169.00 cm/s MV A velocity: 137.00 cm/s MV E/A ratio:  1.23 Rudean Haskell MD Electronically signed by Rudean Haskell MD Signature Date/Time: 08/25/2022/5:44:51 PM    Final    DG Wrist 2 Views Left  Result Date: 08/24/2022 CLINICAL DATA:  Pain left wrist x2 weeks EXAM: LEFT WRIST - 2 VIEW COMPARISON:  None Available. FINDINGS: No recent displaced fracture or dislocation is seen. There is old healed fracture in the shaft of fifth metacarpal. Radiolucencies are seen in the lunate and scaphoid. There is narrowing of radiocarpal joint space. There are amorphous calcifications in the soft tissues along the medial and lateral aspect of the wrist and in the soft tissues between the first and second metacarpals. IMPRESSION: No recent displaced fracture or dislocation is seen. Radiolucencies are seen in lunate and scaphoid along with joint space narrowing in the radiocarpal joint. Findings may be due to degenerative arthritis. Possibility of infectious process in the lunate and scaphoid is not excluded. Amorphous soft tissue calcifications are seen adjacent to the wrist and in the soft tissues between first and second metacarpals. This may suggest old soft tissue injury or residual changes from surgery. Electronically Signed   By: Elmer Picker M.D.   On: 08/24/2022 16:32   Medications:  cefTRIAXone (ROCEPHIN)  IV 2 g (08/25/22 1743)    calcitRIOL  0.25 mcg Oral Q T,Th,Sa-HD   calcium acetate  1,334 mg Oral TID WC   carvedilol  6.25 mg Oral BID  WC  Chlorhexidine Gluconate Cloth  6 each Topical Q0600   doxycycline  100 mg Oral Q12H   heparin  5,000 Units Subcutaneous Q8H   sodium chloride flush  3 mL Intravenous Q12H   No HD center  Assessment/Plan: 1 Chest pain: trops flat, EKG unremarkable. Cardiology following.  2 ESRD:  ESRD MWF [facility] [facility].  Moved back to [place].  Notified renal navigator of need for CLIP. Next HD 08/26/2022 3 Hypertension:  has been out of meds x 3 weeks- put back on home meds and expect improvement with improvement in vol status. UF as tolerated.  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 Tsat is 53%. Start ESA with HD 08/26/2022 5. Metabolic Bone Disease:  PO4 slightly elevated, continue binders. Low dose VDRA. Check PTH with HD tomorrow.  6.  Dispo: pending    Madelon Lips MD 08/26/2022, 10:11 AM  [facility] [phone]

## 2022-08-26 NOTE — TOC Initial Note (Signed)
Transition of Care [facility]) - Initial/Assessment Note    Patient Details  Name: Natasha Long MRN: [id number] Date of Birth: 1996/09/21  Transition of Care [facility]) CM/SW Contact:    Bethann Berkshire, [place] Phone Number: 08/26/2022, 3:49 PM  Clinical Narrative:                  CSW met with pt to discuss disposition. She states she was living in a homeless shelter, [facility], in [place] for the last 2 months. She stated that she had no supports in [place] and she wasn't getting along well in the shelter so she decided to return to [place]. She states prior to living in [place] she had lived in [place] for 7 years. Before leaving for [employer], pt state she lost her apartment and became homeless and that is when she decided to move to the shelter in [place]. She is vague about friends/supports in [place]. She states she would unable to stay with any of her friends and indicates that they have unstable housing or are staying with their own family members. She states she uses medicaid transport to get to her OP HD. She used to stay in [facility] shelter prior to obtaining her apartment. She states she [employer], [employer], is familiar with her and she is hoping to be able to get a bed there. She also has worked with partners ending homelessness to get her past apartment. CSW explained limited availability of shelter beds though pt seems confident that [facility] will help pt get a bed. CSW agreed to leave message with [facility] though asked pt to call and leave message as well.   CSW called and lert message with [employer], [place], requesting return call. CSW provided [employer] number to pt.  Expected Discharge Plan: Homeless Shelter Barriers to Discharge: Continued Medical Work up   Patient Goals and CMS Choice        Expected Discharge Plan and Services Expected Discharge Plan: [place]        Living arrangements for the past 2 months: Homeless Shelter                                      Prior Living Arrangements/Services Living arrangements for the past 2 months: Homeless Shelter   Patient language and need for interpreter reviewed:: Yes        Need for Family Participation in Patient Care: No (Comment) Care giver support system in place?: No (comment)   Criminal Activity/Legal Involvement Pertinent to Current Situation/Hospitalization: No - Comment as needed  Activities of Daily Living Home Assistive Devices/Equipment: None ADL Screening (condition at time of admission) Patient's cognitive ability adequate to safely complete daily activities?: No Is the patient deaf or have difficulty hearing?: No Does the patient have difficulty seeing, even when wearing glasses/contacts?: No Does the patient have difficulty concentrating, remembering, or making decisions?: No Patient able to express need for assistance with ADLs?: Yes Does the patient have difficulty dressing or bathing?: No Independently performs ADLs?: Yes (appropriate for developmental age) Does the patient have difficulty walking or climbing stairs?: No Weakness of Legs: None Weakness of Arms/Hands: None  Permission Sought/Granted                  Emotional Assessment Appearance:: Appears stated age Attitude/Demeanor/Rapport: Engaged Affect (typically observed): Pleasant Orientation: : Oriented to Self,  Oriented to Place, Oriented to  Time, Oriented to Situation Alcohol / Substance Use: Not Applicable Psych Involvement: No (comment)  Admission diagnosis:  Pleural effusion [J90] Elevated troponin [R77.8] Chest pain [R07.9] Nonspecific chest pain [R07.9] Patient Active Problem List   Diagnosis Date Noted   Mitral valve insufficiency    Pleural effusion    Abnormal chest x-ray 08/24/2022   Heart failure with reduced ejection fraction ([place]) 08/24/2022   Acute on chronic systolic  heart failure (HCC)    Volume overload 06/30/2022   Community acquired pneumonia 70/78/6754   Metabolic acidosis 49/20/1007   Acute kidney injury superimposed on CKD ([place]) 06/26/2022   Uremia of renal origin 05/15/2022   High risk sexual behavior 05/15/2022   Polysubstance abuse ([place]) 05/15/2022   Lip lesion 05/06/2022   Vaginal itching 05/06/2022   Left ankle pain 05/06/2022   Contamination of blood culture 05/06/2022   Uremia    Pericardial effusion without cardiac tamponade    Altered mental status 05/03/2022   Syphilis    Pain management    Septic arthritis of multiple joints ([place]) 02/01/2022   Group G streptococcal infection 02/01/2022   Left wrist pain    Hyperkalemia 01/30/2022   Effusion of right knee    Arthritis, septic ([place])    Dialysis patient, noncompliant    Lower leg pain 01/29/2022   Hypokalemia 01/29/2022   Hyperphosphatemia 01/29/2022   Nonspecific chest pain    Hypervolemia associated with renal insufficiency 12/23/2021   Elevated troponin 12/23/2021   ESRD on dialysis ([place]) 12/04/2021   Tobacco use 08/11/2021   Nipple discharge 08/11/2021   Breast pain 08/11/2021   Exposure to sexually transmitted disease (STD) 11/18/7587   Complication of vascular dialysis catheter 02/12/2021   Moderate protein-calorie malnutrition ([place]) 01/27/2021   Allergy, unspecified, initial encounter 01/23/2021   Anemia in chronic kidney disease 01/23/2021   End stage renal disease ([place]) 01/23/2021   Iron deficiency anemia, unspecified 01/23/2021   Other specified coagulation defects ([place]) 01/23/2021   Secondary hyperparathyroidism of renal origin ([place]) 01/23/2021   Anemia    Suicidal ideations    Influenza vaccine refused 12/19/2020   Morbid obesity ([place]) 01/30/2020   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   Cocaine use, 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[place]) 01/30/2020   Mood disorder ([place]) 06/27/2019   Borderline personality disorder ([place]) 11/16/2018    Moderate cannabis use disorder ([place]) 11/16/2018   Severe recurrent major depression without psychotic features ([place]) 11/15/2018   Chronic hypertension 04/04/2018   PTSD (post-traumatic stress disorder) 04/01/2018   PCP:  [name], MD Pharmacy:   [facility] - [place], [place] - [address] [place] [address] Phone: [phone] Fax: [phone]     Social Determinants of Health (SDOH) Interventions    Readmission Risk Interventions    06/29/2022   11:17 AM  Readmission Risk Prevention Plan  Transportation Screening Complete  Medication Review ([place]) Complete  PCP or Specialist appointment within 3-5 days of discharge Complete  HRI or [place] Complete  SW Recovery Care/Counseling Consult Complete  [place] Not Applicable

## 2022-08-26 NOTE — Progress Notes (Addendum)
Rounding Note    Patient Name: Natasha Long Date of Encounter: 08/26/2022  [place] Cardiologist: Sinclair Grooms, MD   Subjective   Patient seen while in dialysis. She is very sleepy and is uncooperative with my exam/did not answer ROS questions.  Inpatient Medications    Scheduled Meds:  calcitRIOL  0.25 mcg Oral Q T,Th,Sa-HD   calcium acetate  1,334 mg Oral TID WC   carvedilol  6.25 mg Oral BID WC   Chlorhexidine Gluconate Cloth  6 each Topical Q0600   doxycycline  100 mg Oral Q12H   heparin  5,000 Units Subcutaneous Q8H   sodium chloride flush  3 mL Intravenous Q12H   Continuous Infusions:  cefTRIAXone (ROCEPHIN)  IV 2 g (08/25/22 1743)   PRN Meds: acetaminophen **OR** acetaminophen, albuterol, benzonatate, diclofenac Sodium, HYDROcodone bit-homatropine, ondansetron **OR** ondansetron (ZOFRAN) IV   Vital Signs    Vitals:   08/26/22 0830 08/26/22 0900 08/26/22 0930 08/26/22 1000  BP: (!) 157/103 (!) 150/98 (!) 155/99 (!) 156/106  Pulse: (!) 102 100 98 100  Resp: 15 (!) 21 (!) 32 (!) 29  Temp:      TempSrc:      SpO2: 90% 92% 90% 91%  Weight:      Height:        Intake/Output Summary (Last 24 hours) at 08/26/2022 1040 Last data filed at 08/25/2022 1830 Gross per 24 hour  Intake 597 ml  Output --  Net 597 ml      08/26/2022    8:10 AM 08/26/2022    6:05 AM 08/25/2022   11:27 AM  Last 3 Weights  Weight (lbs) 195 lb 8.8 oz 191 lb 4.8 oz 190 lb 4.1 oz  Weight (kg) 88.7 kg 86.773 kg 86.3 kg      Telemetry    Sinus/sinus tachycardia- Personally Reviewed  ECG    No ECG today  Physical Exam   GEN: No acute distress.   Neck: No JVD Cardiac: RRR, no murmurs, rubs, or gallops.  Respiratory: Anterior exam only today due to dialysis positioning. bilaterally, diminished bases L>R GI: Soft, nontender, non-distended  MS: Trace lower extremity edema; No deformity. Neuro:  Nonfocal  Psych: Patient disengaged with exam today, not  answering questions.  Labs    High Sensitivity Troponin:   Recent Labs  Lab 08/23/22 2123 08/24/22 0440  TROPONINIHS 399* 326*     Chemistry Recent Labs  Lab 08/24/22 1024 08/25/22 0416 08/26/22 0824  NA 139 138 138  K 4.7 4.0 3.9  CL 102 100 100  CO2 21* 24 24  GLUCOSE 130* 104* 106*  BUN 76* 49* 68*  CREATININE 11.66* 8.48* 10.02*  CALCIUM 9.6 9.8 10.1  ALBUMIN 2.7* 2.5* 2.6*  GFRNONAA 4* 6* 5*  ANIONGAP 16* 14 14    Lipids No results for input(s): "CHOL", "TRIG", "HDL", "LABVLDL", "LDLCALC", "CHOLHDL" in the last 168 hours.  Hematology Recent Labs  Lab 08/23/22 2123 08/24/22 1024 08/25/22 0416  WBC 9.6 9.3 8.2  RBC 2.66* 2.49* 2.62*  HGB 8.2* 7.8* 7.9*  HCT 26.7* 24.7* 26.4*  MCV 100.4* 99.2 100.8*  MCH 30.8 31.3 30.2  MCHC 30.7 31.6 29.9*  RDW 16.0* 16.1* 16.0*  PLT 324 298 307   Thyroid No results for input(s): "TSH", "FREET4" in the last 168 hours.  BNP Recent Labs  Lab 08/24/22 1024  BNP 3,410.3*    DDimer No results for input(s): "DDIMER" in the last 168 hours.   Radiology  ECHOCARDIOGRAM COMPLETE  Result Date: 08/25/2022    ECHOCARDIOGRAM REPORT   Patient Name:   Natasha Long Date of Exam: 08/25/2022 Medical Rec #:  [id number]         Height:       68.0 in Accession #:    [id number]        Weight:       190.3 lb Date of Birth:  1995/12/21         BSA:          2.001 m Patient Age:    26 years          BP:           149/96 mmHg Patient Gender: F                 HR:           106 bpm. Exam Location:  Inpatient Procedure: 2D Echo, 3D Echo, Cardiac Doppler and Color Doppler Indications:    Chest pain  History:        Patient has prior history of Echocardiogram examinations.                 Signs/Symptoms:Chest Pain; Risk Factors:Hypertension. CKD.                 Polysubstance abuse.  Sonographer:    Clayton Lefort RDCS (AE) Referring Phys: [id number] RONDELL A SMITH IMPRESSIONS  1. Left ventricular ejection fraction, by estimation, is 45 to 50%. Left  ventricular ejection fraction by 3D volume is 44 %. The left ventricle has mildly decreased function. The left ventricle demonstrates global hypokinesis. The left ventricular internal cavity size was mildly dilated. There is severe concentric left ventricular hypertrophy. Left ventricular diastolic parameters are indeterminate.  2. Right ventricular systolic function is normal. The right ventricular size is moderately enlarged. There is severely elevated pulmonary artery systolic pressure.  3. Moderate pericardial effusion. The pericardial effusion is circumferential. Large pleural effusion in the left lateral region.  4. The mitral valve is degenerative. Severe mitral valve regurgitation.  5. The tricuspid valve is abnormal. 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  6. The aortic valve is abnormal. There is mild calcification of the aortic valve. Aortic valve regurgitation is not visualized. Aortic valve mean gradient measures 13.0 mmHg. Comparison(s): LVEF similar to prior. Pericardial effusion is similar to prior. Valve disease has worsened. Limited repeat study when euvolemic would be reasonable. FINDINGS  Left Ventricle: Left ventricular ejection fraction, by estimation, is 45 to 50%. Left ventricular ejection fraction by 3D volume is 44 %. The left ventricle has mildly decreased function. The left ventricle demonstrates global hypokinesis. The left ventricular internal cavity size was mildly dilated. There is severe concentric left ventricular hypertrophy. Left ventricular diastolic parameters are indeterminate. Right Ventricle: The right ventricular size is moderately enlarged. No increase in right ventricular wall thickness. Right ventricular systolic function is normal. There is severely elevated pulmonary artery systolic pressure. The tricuspid regurgitant velocity is 3.54 m/s, and with an assumed right atrial pressure of 15 mmHg, the estimated right ventricular systolic pressure is 93.2 mmHg. Left Atrium:  Left atrial size was normal in size. Right Atrium: Right atrial size was normal in size. Pericardium: A moderately sized pericardial effusion is present. The pericardial effusion is circumferential. Mitral Valve: The mitral valve is degenerative in appearance. Severe mitral valve regurgitation. MV peak gradient, 15.8 mmHg. The mean mitral valve gradient is 7.0 mmHg with average heart rate of 105 bpm. Tricuspid Valve: The  tricuspid valve is abnormal. 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 Aortic Valve: The aortic valve is abnormal. There is mild calcification of the aortic valve. Aortic valve regurgitation is not visualized. Aortic valve mean gradient measures 13.0 mmHg. Aortic valve peak gradient measures 24.4 mmHg. Aortic valve area, by  VTI measures 1.91 cm. Pulmonic Valve: The pulmonic valve was normal in structure. Pulmonic valve regurgitation is not visualized. No evidence of pulmonic stenosis. Aorta: The aortic root and ascending aorta are structurally normal, with no evidence of dilitation. IAS/Shunts: No atrial level shunt detected by color flow Doppler. Additional Comments: There is a large pleural effusion in the left lateral region.  LEFT VENTRICLE PLAX 2D LVIDd:         5.40 cm         Diastology LVIDs:         4.10 cm         LV e' medial:    6.53 cm/s LV PW:         1.80 cm         LV E/e' medial:  25.9 LV IVS:        1.90 cm         LV e' lateral:   9.03 cm/s LVOT diam:     2.10 cm         LV E/e' lateral: 18.7 LV SV:         70 LV SV Index:   35 LVOT Area:     3.46 cm        3D Volume EF                                LV 3D EF:    Left                                             ventricul LV Volumes (MOD)                            ar LV vol d, MOD    154.0 ml                   ejection A4C:                                        fraction LV vol s, MOD    82.1 ml                    by 3D A4C:                                        volume is LV SV MOD A4C:   154.0 ml                   44 %.                                  3D Volume EF:  3D EF:        44 %                                LV EDV:       262 ml                                LV ESV:       146 ml                                LV SV:        115 ml RIGHT VENTRICLE             IVC RV Basal diam:  4.90 cm     IVC diam: 2.30 cm RV Mid diam:    4.10 cm RV S prime:     17.40 cm/s TAPSE (M-mode): 2.0 cm LEFT ATRIUM             Index        RIGHT ATRIUM           Index LA diam:        3.50 cm 1.75 cm/m   RA Area:     20.00 cm LA Vol (A2C):   61.0 ml 30.49 ml/m  RA Volume:   56.80 ml  28.39 ml/m LA Vol (A4C):   58.1 ml 29.04 ml/m LA Biplane Vol: 62.1 ml 31.04 ml/m  AORTIC VALVE AV Area (Vmax):    1.92 cm AV Area (Vmean):   1.90 cm AV Area (VTI):     1.91 cm AV Vmax:           247.00 cm/s AV Vmean:          170.000 cm/s AV VTI:            0.368 m AV Peak Grad:      24.4 mmHg AV Mean Grad:      13.0 mmHg LVOT Vmax:         137.00 cm/s LVOT Vmean:        93.500 cm/s LVOT VTI:          0.203 m LVOT/AV VTI ratio: 0.55  AORTA Ao Root diam: 3.00 cm Ao Asc diam:  3.20 cm MITRAL VALVE                TRICUSPID VALVE MV Area (PHT): 6.48 cm     TR Peak grad:   50.1 mmHg MV Area VTI:   1.72 cm     TR Vmax:        354.00 cm/s MV Peak grad:  15.8 mmHg MV Mean grad:  7.0 mmHg     SHUNTS MV Vmax:       1.99 m/s     Systemic VTI:  0.20 m MV Vmean:      128.0 cm/s   Systemic Diam: 2.10 cm MV Decel Time: 117 msec MR Peak grad: 146.9 mmHg MR Mean grad: 89.0 mmHg MR Vmax:      606.00 cm/s MR Vmean:     435.0 cm/s MV E velocity: 169.00 cm/s MV A velocity: 137.00 cm/s MV E/A ratio:  1.23 Rudean Haskell MD Electronically signed by Rudean Haskell MD Signature Date/Time: 08/25/2022/5:44:51 PM    Final    DG Wrist 2 Views Left  Result Date: 08/24/2022 CLINICAL DATA:  Pain left wrist x2 weeks EXAM: LEFT WRIST - 2 VIEW COMPARISON:  None Available. FINDINGS: No recent displaced fracture or dislocation is seen. There is old healed  fracture in the shaft of fifth metacarpal. Radiolucencies are seen in the lunate and scaphoid. There is narrowing of radiocarpal joint space. There are amorphous calcifications in the soft tissues along the medial and lateral aspect of the wrist and in the soft tissues between the first and second metacarpals. IMPRESSION: No recent displaced fracture or dislocation is seen. Radiolucencies are seen in lunate and scaphoid along with joint space narrowing in the radiocarpal joint. Findings may be due to degenerative arthritis. Possibility of infectious process in the lunate and scaphoid is not excluded. Amorphous soft tissue calcifications are seen adjacent to the wrist and in the soft tissues between first and second metacarpals. This may suggest old soft tissue injury or residual changes from surgery. Electronically Signed   By: Elmer Picker M.D.   On: 08/24/2022 16:32    Cardiac Studies   05/03/2022 TTE (limited)   IMPRESSIONS     1. Left ventricular ejection fraction, by estimation, is 45 to 50%. The  left ventricle has mildly decreased function.   2. Right ventricular systolic function is normal. The right ventricular  size is normal.   3. The inferior vena cava is dilated in size with <50% respiratory  variability, suggesting right atrial pressure of 15 mmHg.   4. Moderate pericardial effusion, measures 1.1 cm adjacent to LV lateral  wall. IVC fixed/dilated, but no RA/RV collapse seen to suggest tamponade   FINDINGS   Left Ventricle: Left ventricular ejection fraction, by estimation, is 45  to 50%. The left ventricle has mildly decreased function.   Right Ventricle: The right ventricular size is normal. Right ventricular  systolic function is normal.   Pericardium: A moderately sized pericardial effusion is present.   Venous: The inferior vena cava is dilated in size with less than 50%  respiratory variability, suggesting right atrial pressure of 15 mmHg.   08/26/22  TTE  IMPRESSIONS     1. Left ventricular ejection fraction, by estimation, is 45 to 50%. Left  ventricular ejection fraction by 3D volume is 44 %. The left ventricle has  mildly decreased function. The left ventricle demonstrates global  hypokinesis. The left ventricular  internal cavity size was mildly dilated. There is severe concentric left  ventricular hypertrophy. Left ventricular diastolic parameters are  indeterminate.   2. Right ventricular systolic function is normal. The right ventricular  size is moderately enlarged. There is severely elevated pulmonary artery  systolic pressure.   3. Moderate pericardial effusion. The pericardial effusion is  circumferential. Large pleural effusion in the left lateral region.   4. The mitral valve is degenerative. Severe mitral valve regurgitation.   5. The tricuspid valve is abnormal. Tricuspid valve regurgitation is  severe.   6. The aortic valve is abnormal. There is mild calcification of the  aortic valve. Aortic valve regurgitation is not visualized. Aortic valve  mean gradient measures 13.0 mmHg.   Comparison(s): LVEF similar to prior. Pericardial effusion is similar to  prior. Valve disease has worsened. Limited repeat study when euvolemic  would be reasonable.   FINDINGS   Left Ventricle: Left ventricular ejection fraction, by estimation, is 45  to 50%. Left ventricular ejection fraction by 3D volume is 44 %. The left  ventricle has mildly decreased function. The left ventricle demonstrates  global hypokinesis. The left  ventricular internal cavity size was mildly dilated. There is severe  concentric left ventricular hypertrophy. Left ventricular diastolic  parameters are indeterminate.   Right Ventricle: The right ventricular size is moderately enlarged. No  increase in right ventricular wall thickness. Right ventricular systolic  function is normal. There is severely elevated pulmonary artery systolic  pressure. The  tricuspid regurgitant  velocity is 3.54 m/s, and with an assumed right atrial pressure of 15  mmHg, the estimated right ventricular systolic pressure is 16.1 mmHg.   Left Atrium: Left atrial size was normal in size.   Right Atrium: Right atrial size was normal in size.   Pericardium: A moderately sized pericardial effusion is present. The  pericardial effusion is circumferential.   Mitral Valve: The mitral valve is degenerative in appearance. Severe  mitral valve regurgitation. MV peak gradient, 15.8 mmHg. The mean mitral  valve gradient is 7.0 mmHg with average heart rate of 105 bpm.   Tricuspid Valve: The tricuspid valve is abnormal. Tricuspid valve  regurgitation is severe.   Aortic Valve: The aortic valve is abnormal. There is mild calcification of  the aortic valve. Aortic valve regurgitation is not visualized. Aortic  valve mean gradient measures 13.0 mmHg. Aortic valve peak gradient  measures 24.4 mmHg. Aortic valve area, by   VTI measures 1.91 cm.   Pulmonic Valve: The pulmonic valve was normal in structure. Pulmonic valve  regurgitation is not visualized. No evidence of pulmonic stenosis.   Aorta: The aortic root and ascending aorta are structurally normal, with  no evidence of dilitation.   IAS/Shunts: No atrial level shunt detected by color flow Doppler.   Additional Comments: There is a large pleural effusion in the left lateral  region.    Patient Profile    Natasha Long is a 26 y.o. 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 Tamala Julian.  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 CXR with mild cardiogenic failure, bilateral small pleural effusions. Questionable right basilar loculated effusion. June 2023 echo with LVEF 45-50%, moderate pericardial effusion. This was medically managed. Repeat echo 9/26 with stable LVEF 45-50%. Severe MR noted.  Patient actively being dialyzed on my exam today and was uncooperative with physical exam, difficult to assess JVD, no obvious elevation. Trace peripheral edema noted. BNP up to 3410.3 prior to dialysis this admission.   Patient completed HD 9/25, 3068mL removed. Suspect that patient's volume status is 2/2 to missed dialysis session(s) and being off of regular prescriptions. Volume clearly improved. She is currently undergoing second dialysis session this admission. Patient is not on any GDMT, likely due to cocaine use, ESRD, and access issues. Coreg 6.25mg  BID initiated with limited improvement to BP. Will add daily Imdur ER 30mg  and Hydralazine 25mg  TID for afterload reduction. Cessation of cocaine use strongly encouraged. Discussed with patient that this exacerbates HF.   Severe MR  Echo this admission with mitral valve degeneration and severe valve regurgitation. Some of this is likely related to patient's volume status. Will plan to reassess with limited echo after patient has been dialyzed to euvolemia. Given patient's history of substance abuse, will also check blood  cultures, ordered today.  Chest pain   Patient with chest pain on admission, troponin of 399 and 326. Strongly suspect pain and troponin leak are both secondary to demand ischemia in ESRD patient with missed dialysis, missing chronic medications, and cocaine use. Echo with LVEF 45-50%, stable with June 2023 TTE. Global hypokinesis with severe LV  hypertrophy.   Given downtrending troponin, stable echo and no acute ECG changes, no need for acute ischemic evaluation.  Pericardial  effusion  Patient with moderate pericardial effusion noted June 2023. Stable appearing on repeat echo this admission. Plan limited echo to reassess upon patient reaching euvolemia 2/2 dialysis.   ESRD on HD   Hemodialysis per primary team/nephrology. Continue Calcitriol, [place], PhosLo.     Per primary team:   Cough Anemia of chronic disease Mood disorder    For questions or updates, please contact [place] Please consult [email or web address] for contact info under        Signed, Lily Kocher, PA-C  08/26/2022, 10:40 AM    Patient seen and examined.  Agree with above documentation.  On exam, patient is alert and oriented, regular rate and rhythm, 2/6 systolic murmur, lungs CTAB, trace LE edema, + JVD.  She is refusing labs.  Echo showed EF 45-50%, moderate effusion, severe MR.  Suspect this is driven by her hypervolemia, plan recheck limited echo once euvolemic.  Will add hydralazine/imdur.  Donato Heinz, MD

## 2022-08-26 NOTE — Progress Notes (Signed)
Patient refused morning lab draw, pt insisting to draw blood through peripheral IV. After education provided patient  persistently refuse.

## 2022-08-26 NOTE — Progress Notes (Signed)
Patient off floor to dialysis

## 2022-08-26 NOTE — Progress Notes (Signed)
Received patient in bed to unit.  Alert and oriented.  Informed consent signed and in chart.   Treatment initiated: 830 Treatment completed: 1300  Patient tolerated well.  Transported back to the room  Alert, without acute distress.  Hand-off given to patient's nurse.   Access used: LAVF Access issues: none  Total UF removed: 3.1L Medication(s) given: none   08/26/22 1300  Vitals  Pulse Rate 95  Resp (!) 30  BP (!) 162/112  SpO2 91 %  O2 Device Room Air  Post Treatment  Dialyzer Clearance Clear  Duration of HD Treatment -hour(s) 4 hour(s)  Hemodialysis Intake (mL) 0 mL  Liters Processed 90.3  Fluid Removed 3.1 mL  Tolerated HD Treatment Yes  AVG/AVF Arterial Site Held (minutes) 10 minutes  AVG/AVF Venous Site Held (minutes) 10 minutes      [facility]

## 2022-08-26 NOTE — Progress Notes (Addendum)
Pt has been accepted at [facility]) on TTS 12:40 chair time. Pt can start as soon as tomorrow and would need to arrive at 12:00 to complete paperwork prior to treatment. Pt currently receiving HD. Will discuss details with pt once she returns to her room. Update provided to attending, nephrologist, renal NP, RN, and RN CM via secure chat. If pt does not start tomorrow, clinic is requesting that pt complete paperwork at clinic on Friday in order to start on Saturday. This info was provided to team as well. Pt to likely require assistance from [facility] staff for resources due to homelessness and to also confirm pt's transportation to/from HD since pt recently returned to the area from [place], [place]. Will assist as needed.  Melven Sartorius Renal Navigator [phone]  Addendum at 3:48 pm: Met with pt bedside this afternoon. Discussed out-pt HD arrangements at [facility]. Pt provided schedule letter with details noted and pt agreeable to plan. Clinic unable to start pt on Saturday if pt unable to start tomorrow. Team provided update via secure chat.

## 2022-08-26 NOTE — Progress Notes (Signed)
Patient back to room from dialysis

## 2022-08-26 NOTE — Progress Notes (Signed)
Pt stable for HD no c/os VSS avf +/+ UFG 3.5L

## 2022-08-27 DIAGNOSIS — I5023 Acute on chronic systolic (congestive) heart failure: Secondary | ICD-10-CM | POA: Diagnosis not present

## 2022-08-27 DIAGNOSIS — R778 Other specified abnormalities of plasma proteins: Secondary | ICD-10-CM | POA: Diagnosis not present

## 2022-08-27 DIAGNOSIS — R079 Chest pain, unspecified: Secondary | ICD-10-CM | POA: Diagnosis not present

## 2022-08-27 DIAGNOSIS — I340 Nonrheumatic mitral (valve) insufficiency: Secondary | ICD-10-CM | POA: Diagnosis not present

## 2022-08-27 DIAGNOSIS — I3139 Other pericardial effusion (noninflammatory): Secondary | ICD-10-CM | POA: Diagnosis not present

## 2022-08-27 DIAGNOSIS — N186 End stage renal disease: Secondary | ICD-10-CM | POA: Diagnosis not present

## 2022-08-27 LAB — PARATHYROID HORMONE, INTACT (NO CA): PTH: 392 pg/mL — ABNORMAL HIGH (ref 15–65)

## 2022-08-27 LAB — MRSA NEXT GEN BY PCR, NASAL: MRSA by PCR Next Gen: DETECTED — AB

## 2022-08-27 MED ORDER — CARVEDILOL 12.5 MG PO TABS
12.5000 mg | ORAL_TABLET | Freq: Two times a day (BID) | ORAL | Status: DC
  Administered 2022-08-27 – 2022-08-28 (×4): 12.5 mg via ORAL
  Filled 2022-08-27 (×4): qty 1

## 2022-08-27 NOTE — Progress Notes (Signed)
[facility] Progress Note   Subjective: Seen in room.  Eating breakfast.  CLIP'd.  Objective Vitals:   08/26/22 1406 08/26/22 1917 08/27/22 0024 08/27/22 0635  BP: (!) 140/95 (!) 145/96  (!) 149/104  Pulse: (!) 101 (!) 108  (!) 103  Resp: 20 18  20   Temp: 98.6 F (37 C) (!) 97.2 F (36.2 C)  97.6 F (36.4 C)  TempSrc: Oral Axillary  Oral  SpO2: 91% 94%  92%  Weight:   85.3 kg   Height:       Physical Exam General: WN,WD female in NAD Heart: S1,S2 RRR Lungs: CTAB Abdomen:NABs, NT Extremities: Very trace LE edema Dialysis Access: L AVF + T/B    Additional Objective Labs: Basic Metabolic Panel: Recent Labs  Lab 08/24/22 1024 08/25/22 0416 08/26/22 0824  NA 139 138 138  K 4.7 4.0 3.9  CL 102 100 100  CO2 21* 24 24  GLUCOSE 130* 104* 106*  BUN 76* 49* 68*  CREATININE 11.66* 8.48* 10.02*  CALCIUM 9.6 9.8 10.1  PHOS 8.6* 6.5* 7.2*   Liver Function Tests: Recent Labs  Lab 08/24/22 1024 08/25/22 0416 08/26/22 0824  ALBUMIN 2.7* 2.5* 2.6*   No results for input(s): "LIPASE", "AMYLASE" in the last 168 hours. CBC: Recent Labs  Lab 08/23/22 2123 08/24/22 1024 08/25/22 0416  WBC 9.6 9.3 8.2  HGB 8.2* 7.8* 7.9*  HCT 26.7* 24.7* 26.4*  MCV 100.4* 99.2 100.8*  PLT 324 298 307   Blood Culture    Component Value Date/Time   SDES BLOOD BLOOD RIGHT HAND 05/05/2022 1233   SPECREQUEST  05/05/2022 1233    BOTTLES DRAWN AEROBIC AND ANAEROBIC Blood Culture adequate volume   CULT  05/05/2022 1233    NO GROWTH 5 DAYS Performed at [facility], [place] [address], [place],  [address]    REPTSTATUS 05/10/2022 FINAL 05/05/2022 1233    Cardiac Enzymes: No results for input(s): "CKTOTAL", "CKMB", "CKMBINDEX", "TROPONINI" in the last 168 hours. CBG: No results for input(s): "GLUCAP" in the last 168 hours. Iron Studies: No results for input(s): "IRON", "TIBC", "TRANSFERRIN", "FERRITIN" in the last 72  hours. @[email or web address] @ Studies/Results: ECHOCARDIOGRAM COMPLETE  Result Date: 08/25/2022    ECHOCARDIOGRAM REPORT   Patient Name:   Natasha Long Date of Exam: 08/25/2022 Medical Rec #:  [id number]         Height:       68.0 in Accession #:    [id number]        Weight:       190.3 lb Date of Birth:  06/08/1996         BSA:          2.001 m Patient Age:    26 years          BP:           149/96 mmHg Patient Gender: F                 HR:           106 bpm. Exam Location:  Inpatient Procedure: 2D Echo, 3D Echo, Cardiac Doppler and Color Doppler Indications:    Chest pain  History:        Patient has prior history of Echocardiogram examinations.                 Signs/Symptoms:Chest Pain; Risk Factors:Hypertension. CKD.                 Polysubstance abuse.  Sonographer:    Clayton Lefort RDCS (AE) Referring Phys: [id number] RONDELL A SMITH IMPRESSIONS  1. Left ventricular ejection fraction, by estimation, is 45 to 50%. Left ventricular ejection fraction by 3D volume is 44 %. The left ventricle has mildly decreased function. The left ventricle demonstrates global hypokinesis. The left ventricular internal cavity size was mildly dilated. There is severe concentric left ventricular hypertrophy. Left ventricular diastolic parameters are indeterminate.  2. Right ventricular systolic function is normal. The right ventricular size is moderately enlarged. There is severely elevated pulmonary artery systolic pressure.  3. Moderate pericardial effusion. The pericardial effusion is circumferential. Large pleural effusion in the left lateral region.  4. The mitral valve is degenerative. Severe mitral valve regurgitation.  5. The tricuspid valve is abnormal. 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  6. The aortic valve is abnormal. There is mild calcification of the aortic valve. Aortic valve regurgitation is not visualized. Aortic valve mean gradient measures 13.0 mmHg. Comparison(s): LVEF similar to prior. Pericardial effusion is  similar to prior. Valve disease has worsened. Limited repeat study when euvolemic would be reasonable. FINDINGS  Left Ventricle: Left ventricular ejection fraction, by estimation, is 45 to 50%. Left ventricular ejection fraction by 3D volume is 44 %. The left ventricle has mildly decreased function. The left ventricle demonstrates global hypokinesis. The left ventricular internal cavity size was mildly dilated. There is severe concentric left ventricular hypertrophy. Left ventricular diastolic parameters are indeterminate. Right Ventricle: The right ventricular size is moderately enlarged. No increase in right ventricular wall thickness. Right ventricular systolic function is normal. There is severely elevated pulmonary artery systolic pressure. The tricuspid regurgitant velocity is 3.54 m/s, and with an assumed right atrial pressure of 15 mmHg, the estimated right ventricular systolic pressure is 34.7 mmHg. Left Atrium: Left atrial size was normal in size. Right Atrium: Right atrial size was normal in size. Pericardium: A moderately sized pericardial effusion is present. The pericardial effusion is circumferential. Mitral Valve: The mitral valve is degenerative in appearance. Severe mitral valve regurgitation. MV peak gradient, 15.8 mmHg. The mean mitral valve gradient is 7.0 mmHg with average heart rate of 105 bpm. Tricuspid Valve: The tricuspid valve is abnormal. 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 Aortic Valve: The aortic valve is abnormal. There is mild calcification of the aortic valve. Aortic valve regurgitation is not visualized. Aortic valve mean gradient measures 13.0 mmHg. Aortic valve peak gradient measures 24.4 mmHg. Aortic valve area, by  VTI measures 1.91 cm. Pulmonic Valve: The pulmonic valve was normal in structure. Pulmonic valve regurgitation is not visualized. No evidence of pulmonic stenosis. Aorta: The aortic root and ascending aorta are structurally normal, with no evidence of  dilitation. IAS/Shunts: No atrial level shunt detected by color flow Doppler. Additional Comments: There is a large pleural effusion in the left lateral region.  LEFT VENTRICLE PLAX 2D LVIDd:         5.40 cm         Diastology LVIDs:         4.10 cm         LV e' medial:    6.53 cm/s LV PW:         1.80 cm         LV E/e' medial:  25.9 LV IVS:        1.90 cm         LV e' lateral:   9.03 cm/s LVOT diam:     2.10 cm  LV E/e' lateral: 18.7 LV SV:         70 LV SV Index:   35 LVOT Area:     3.46 cm        3D Volume EF                                LV 3D EF:    Left                                             ventricul LV Volumes (MOD)                            ar LV vol d, MOD    154.0 ml                   ejection A4C:                                        fraction LV vol s, MOD    82.1 ml                    by 3D A4C:                                        volume is LV SV MOD A4C:   154.0 ml                   44 %.                                 3D Volume EF:                                3D EF:        44 %                                LV EDV:       262 ml                                LV ESV:       146 ml                                LV SV:        115 ml RIGHT VENTRICLE             IVC RV Basal diam:  4.90 cm     IVC diam: 2.30 cm RV Mid diam:    4.10 cm RV S prime:     17.40 cm/s TAPSE (M-mode): 2.0 cm LEFT ATRIUM             Index        RIGHT ATRIUM  Index LA diam:        3.50 cm 1.75 cm/m   RA Area:     20.00 cm LA Vol (A2C):   61.0 ml 30.49 ml/m  RA Volume:   56.80 ml  28.39 ml/m LA Vol (A4C):   58.1 ml 29.04 ml/m LA Biplane Vol: 62.1 ml 31.04 ml/m  AORTIC VALVE AV Area (Vmax):    1.92 cm AV Area (Vmean):   1.90 cm AV Area (VTI):     1.91 cm AV Vmax:           247.00 cm/s AV Vmean:          170.000 cm/s AV VTI:            0.368 m AV Peak Grad:      24.4 mmHg AV Mean Grad:      13.0 mmHg LVOT Vmax:         137.00 cm/s LVOT Vmean:        93.500 cm/s LVOT VTI:          0.203 m  LVOT/AV VTI ratio: 0.55  AORTA Ao Root diam: 3.00 cm Ao Asc diam:  3.20 cm MITRAL VALVE                TRICUSPID VALVE MV Area (PHT): 6.48 cm     TR Peak grad:   50.1 mmHg MV Area VTI:   1.72 cm     TR Vmax:        354.00 cm/s MV Peak grad:  15.8 mmHg MV Mean grad:  7.0 mmHg     SHUNTS MV Vmax:       1.99 m/s     Systemic VTI:  0.20 m MV Vmean:      128.0 cm/s   Systemic Diam: 2.10 cm MV Decel Time: 117 msec MR Peak grad: 146.9 mmHg MR Mean grad: 89.0 mmHg MR Vmax:      606.00 cm/s MR Vmean:     435.0 cm/s MV E velocity: 169.00 cm/s MV A velocity: 137.00 cm/s MV E/A ratio:  1.23 Rudean Haskell MD Electronically signed by Rudean Haskell MD Signature Date/Time: 08/25/2022/5:44:51 PM    Final    Medications:  cefTRIAXone (ROCEPHIN)  IV 2 g (08/26/22 1619)    calcitRIOL  0.25 mcg Oral Q T,Th,Sa-HD   calcium acetate  1,334 mg Oral TID WC   carvedilol  12.5 mg Oral BID WC   Chlorhexidine Gluconate Cloth  6 each Topical Q0600   doxycycline  100 mg Oral Q12H   heparin  5,000 Units Subcutaneous Q8H   hydrALAZINE  25 mg Oral Q8H   isosorbide mononitrate  30 mg Oral Daily   sodium chloride flush  3 mL Intravenous Q12H   No HD center  Assessment/Plan: 1 Chest pain: trops flat, EKG unremarkable. Cardiology following.  2 ESRD:  ESRD MWF [facility].  Moved back to [place].  Notified renal navigator of need for CLIP. She has been CLIP'd to TTS at [facility] so next rx Saturday.  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 3 Hypertension:  has been out of meds x 3 weeks- put back on home meds and expect improvement with improvement in vol status. UF as tolerated.  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 Tsat is 53%. Start ESA with HD 08/26/2022 5. Metabolic Bone Disease:  PO4 slightly elevated, continue binders. Low dose  VDRA. Check PTH with HD tomorrow.  6.  Dispo: OK from renal perspective to d/c    Madelon Lips  MD 08/27/2022, 9:59 AM  [place]

## 2022-08-27 NOTE — Progress Notes (Signed)
PROGRESS NOTE    Natasha Long  VWP:[id number] DOB: 1996-04-04 DOA: 08/23/2022 PCP: [name], MD   Brief Narrative: [name] is a 26 y.o. 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 Patient presented secondary to chest pain and shortness of breath with associated productive cough. On admission, patient was found to have evidence of fluid overload secondary to hemodialysis non-adherence. Chest x-ray suggests possible pneumonia and patient was started on empiric antibiotics. Nephrology and cardiology consulted and patient started hemodialysis for treatment.    Assessment and Plan:  Chest pain EKG without ischemic changes. Troponin of 399 > 326. Cardiology consulted. Transthoracic Echocardiogram significant for an LVEF of 45-50%. Presumed secondary to fluid overload. Cardiology with recommendations for continued volume management with HD.  Demand ischemia Per cardiology, likely secondary to fluid overload.  ESRD on HD Nephrology consulted and are performing intermittent hemodialysis on MWF  Chronic pericardial effusion Presumed secondary to fluid overload from missed hemodialysis, although imaging suggests effusion is chronic from March 2023. Cardiology recommending continued hemodialysis and repeat limited Transthoracic Echocardiogram once more euvolemic.  Acute on chronic HFrEF Fluid overload Secondary to missed hemodialysis. Management via hemodialysis.  Anemia of chronic kidney disease Stable.  Possible community acquired pneumonia Chest x-ray concerning for possible infiltrate vs atelectasis and patient has associated cough. Procalcitonin of 0.9. Patient started empirically on Ceftriaxone and doxycycline.  Uncontrolled hypertension -Continue Coreg, hydralazine, Imdur  Polysubstance abuse Patient uses marijuana, cocaine and tobacco.  Patient counseled.  Left wrist pain History of septic arthritis. X-ray imaging now shows possible degenerative arthritis. Supportive care initiated with topical analgesics and wrist brace.  High risk sexual activity Patient with concern for possible STI infection. History of syphilis. -GC/chlamydia, HIV, RPR  Homelessness Complicates discharge. TOC consulted for resources.   DVT prophylaxis: Heparin subq Code Status:   Code Status: Full Code Family Communication: None at bedside Disposition Plan: Discharge pending nephrology recommendations   Consultants:  Nephrology Cardiology  Procedures:  Hemodialysis Transthoracic Echocardiogram  Antimicrobials: Ceftriaxone Doxycycline    Subjective: Patient reports no issues this morning.  Objective: BP (!) 149/104 (BP Location: Right Arm)   Pulse (!) 103   Temp 97.6 F (36.4 C) (Oral)   Resp 20   Ht 5\' 8"  (1.727 m)   Wt 85.3 kg   SpO2 92%   BMI 28.59 kg/m   Examination:  General exam: Appears calm and comfortable Respiratory system: Clear to auscultation. Respiratory effort normal. Cardiovascular system: S1 & S2 heard, RRR. Gastrointestinal system: Abdomen is nondistended, soft and nontender. Normal bowel sounds heard. Central nervous system: Alert and oriented. No focal neurological deficits. Musculoskeletal: 1+ bilateral LE pitting edema. No calf tenderness Skin: No cyanosis. No rashes   Data Reviewed: I have personally reviewed following labs and imaging studies  CBC Lab Results  Component Value Date   WBC 8.2 08/25/2022   RBC 2.62 (L) 08/25/2022   HGB 7.9 (L) 08/25/2022   HCT 26.4 (L) 08/25/2022   MCV 100.8 (H) 08/25/2022   MCH 30.2 08/25/2022   PLT 307 08/25/2022   MCHC 29.9 (L) 08/25/2022   RDW 16.0 (H) 08/25/2022   LYMPHSABS 1.4 06/30/2022   MONOABS 0.6 06/30/2022   EOSABS 0.2 06/30/2022   BASOSABS 0.0 37/48/2707     Last metabolic panel Lab Results  Component Value Date   NA 138  08/26/2022   K 3.9 08/26/2022   CL 100 08/26/2022   CO2  24 08/26/2022   BUN 68 (H) 08/26/2022   CREATININE 10.02 (H) 08/26/2022   GLUCOSE 106 (H) 08/26/2022   GFRNONAA 5 (L) 08/26/2022   GFRAA 27 (L) 01/30/2020   CALCIUM 10.1 08/26/2022   PHOS 7.2 (H) 08/26/2022   PROT 6.8 06/26/2022   ALBUMIN 2.6 (L) 08/26/2022   LABGLOB 2.7 01/30/2020   AGRATIO 0.9 (L) 01/30/2020   BILITOT 0.4 06/26/2022   ALKPHOS 107 06/26/2022   AST 59 (H) 06/26/2022   ALT 47 (H) 06/26/2022   ANIONGAP 14 08/26/2022    GFR: Estimated Creatinine Clearance: 9.6 mL/min (A) (by C-G formula based on SCr of 10.02 mg/dL (H)).  No results found for this or any previous visit (from the past 240 hour(s)).    Radiology Studies: ECHOCARDIOGRAM COMPLETE  Result Date: 08/25/2022    ECHOCARDIOGRAM REPORT   Patient Name:   Natasha Long Date of Exam: 08/25/2022 Medical Rec #:  [id number]         Height:       68.0 in Accession #:    [id number]        Weight:       190.3 lb Date of Birth:  January 08, 1996         BSA:          2.001 m Patient Age:    26 years          BP:           149/96 mmHg Patient Gender: F                 HR:           106 bpm. Exam Location:  Inpatient Procedure: 2D Echo, 3D Echo, Cardiac Doppler and Color Doppler Indications:    Chest pain  History:        Patient has prior history of Echocardiogram examinations.                 Signs/Symptoms:Chest Pain; Risk Factors:Hypertension. CKD.                 Polysubstance abuse.  Sonographer:    Clayton Lefort RDCS (AE) Referring Phys: [id number] RONDELL A SMITH IMPRESSIONS  1. Left ventricular ejection fraction, by estimation, is 45 to 50%. Left ventricular ejection fraction by 3D volume is 44 %. The left ventricle has mildly decreased function. The left ventricle demonstrates global hypokinesis. The left ventricular internal cavity size was mildly dilated. There is severe concentric left ventricular hypertrophy. Left ventricular diastolic parameters are indeterminate.  2.  Right ventricular systolic function is normal. The right ventricular size is moderately enlarged. There is severely elevated pulmonary artery systolic pressure.  3. Moderate pericardial effusion. The pericardial effusion is circumferential. Large pleural effusion in the left lateral region.  4. The mitral valve is degenerative. Severe mitral valve regurgitation.  5. The tricuspid valve is abnormal. 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  6. The aortic valve is abnormal. There is mild calcification of the aortic valve. Aortic valve regurgitation is not visualized. Aortic valve mean gradient measures 13.0 mmHg. Comparison(s): LVEF similar to prior. Pericardial effusion is similar to prior. Valve disease has worsened. Limited repeat study when euvolemic would be reasonable. FINDINGS  Left Ventricle: Left ventricular ejection fraction, by estimation, is 45 to 50%. Left ventricular ejection fraction by 3D volume is 44 %. The left ventricle has mildly decreased function. The left ventricle demonstrates global hypokinesis. The left ventricular internal cavity size was mildly dilated. There is severe concentric left  ventricular hypertrophy. Left ventricular diastolic parameters are indeterminate. Right Ventricle: The right ventricular size is moderately enlarged. No increase in right ventricular wall thickness. Right ventricular systolic function is normal. There is severely elevated pulmonary artery systolic pressure. The tricuspid regurgitant velocity is 3.54 m/s, and with an assumed right atrial pressure of 15 mmHg, the estimated right ventricular systolic pressure is 01.6 mmHg. Left Atrium: Left atrial size was normal in size. Right Atrium: Right atrial size was normal in size. Pericardium: A moderately sized pericardial effusion is present. The pericardial effusion is circumferential. Mitral Valve: The mitral valve is degenerative in appearance. Severe mitral valve regurgitation. MV peak gradient, 15.8 mmHg. The mean  mitral valve gradient is 7.0 mmHg with average heart rate of 105 bpm. Tricuspid Valve: The tricuspid valve is abnormal. 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 Aortic Valve: The aortic valve is abnormal. There is mild calcification of the aortic valve. Aortic valve regurgitation is not visualized. Aortic valve mean gradient measures 13.0 mmHg. Aortic valve peak gradient measures 24.4 mmHg. Aortic valve area, by  VTI measures 1.91 cm. Pulmonic Valve: The pulmonic valve was normal in structure. Pulmonic valve regurgitation is not visualized. No evidence of pulmonic stenosis. Aorta: The aortic root and ascending aorta are structurally normal, with no evidence of dilitation. IAS/Shunts: No atrial level shunt detected by color flow Doppler. Additional Comments: There is a large pleural effusion in the left lateral region.  LEFT VENTRICLE PLAX 2D LVIDd:         5.40 cm         Diastology LVIDs:         4.10 cm         LV e' medial:    6.53 cm/s LV PW:         1.80 cm         LV E/e' medial:  25.9 LV IVS:        1.90 cm         LV e' lateral:   9.03 cm/s LVOT diam:     2.10 cm         LV E/e' lateral: 18.7 LV SV:         70 LV SV Index:   35 LVOT Area:     3.46 cm        3D Volume EF                                LV 3D EF:    Left                                             ventricul LV Volumes (MOD)                            ar LV vol d, MOD    154.0 ml                   ejection A4C:                                        fraction LV vol s, MOD    82.1 ml  by 3D A4C:                                        volume is LV SV MOD A4C:   154.0 ml                   44 %.                                 3D Volume EF:                                3D EF:        44 %                                LV EDV:       262 ml                                LV ESV:       146 ml                                LV SV:        115 ml RIGHT VENTRICLE             IVC RV Basal diam:  4.90 cm     IVC diam: 2.30 cm RV Mid  diam:    4.10 cm RV S prime:     17.40 cm/s TAPSE (M-mode): 2.0 cm LEFT ATRIUM             Index        RIGHT ATRIUM           Index LA diam:        3.50 cm 1.75 cm/m   RA Area:     20.00 cm LA Vol (A2C):   61.0 ml 30.49 ml/m  RA Volume:   56.80 ml  28.39 ml/m LA Vol (A4C):   58.1 ml 29.04 ml/m LA Biplane Vol: 62.1 ml 31.04 ml/m  AORTIC VALVE AV Area (Vmax):    1.92 cm AV Area (Vmean):   1.90 cm AV Area (VTI):     1.91 cm AV Vmax:           247.00 cm/s AV Vmean:          170.000 cm/s AV VTI:            0.368 m AV Peak Grad:      24.4 mmHg AV Mean Grad:      13.0 mmHg LVOT Vmax:         137.00 cm/s LVOT Vmean:        93.500 cm/s LVOT VTI:          0.203 m LVOT/AV VTI ratio: 0.55  AORTA Ao Root diam: 3.00 cm Ao Asc diam:  3.20 cm MITRAL VALVE                TRICUSPID VALVE MV Area (PHT): 6.48 cm     TR Peak grad:   50.1 mmHg MV Area VTI:   1.72  cm     TR Vmax:        354.00 cm/s MV Peak grad:  15.8 mmHg MV Mean grad:  7.0 mmHg     SHUNTS MV Vmax:       1.99 m/s     Systemic VTI:  0.20 m MV Vmean:      128.0 cm/s   Systemic Diam: 2.10 cm MV Decel Time: 117 msec MR Peak grad: 146.9 mmHg MR Mean grad: 89.0 mmHg MR Vmax:      606.00 cm/s MR Vmean:     435.0 cm/s MV E velocity: 169.00 cm/s MV A velocity: 137.00 cm/s MV E/A ratio:  1.23 Rudean Haskell MD Electronically signed by Rudean Haskell MD Signature Date/Time: 08/25/2022/5:44:51 PM    Final       LOS: 3 days    Cordelia Poche, MD [facility] 08/27/2022, 2:02 PM   If 7PM-7AM, please contact night-coverage [email or web address]

## 2022-08-27 NOTE — Progress Notes (Signed)
Rounding Note    Patient Name: Natasha Long Date of Encounter: 08/27/2022  [place] Cardiologist: Belva Crome III, MD   Subjective   BP 149/104.  Reports chest pain when coughing.  No dyspnea.  Inpatient Medications    Scheduled Meds:  calcitRIOL  0.25 mcg Oral Q T,Th,Sa-HD   calcium acetate  1,334 mg Oral TID WC   carvedilol  6.25 mg Oral BID WC   Chlorhexidine Gluconate Cloth  6 each Topical Q0600   doxycycline  100 mg Oral Q12H   heparin  5,000 Units Subcutaneous Q8H   hydrALAZINE  25 mg Oral Q8H   isosorbide mononitrate  30 mg Oral Daily   sodium chloride flush  3 mL Intravenous Q12H   Continuous Infusions:  cefTRIAXone (ROCEPHIN)  IV 2 g (08/26/22 1619)   PRN Meds: acetaminophen **OR** acetaminophen, albuterol, benzonatate, diclofenac Sodium, HYDROcodone bit-homatropine, hydrOXYzine, ondansetron **OR** ondansetron (ZOFRAN) IV   Vital Signs    Vitals:   08/26/22 1406 08/26/22 1917 08/27/22 0024 08/27/22 0635  BP: (!) 140/95 (!) 145/96  (!) 149/104  Pulse: (!) 101 (!) 108  (!) 103  Resp: 20 18  20   Temp: 98.6 F (37 C) (!) 97.2 F (36.2 C)  97.6 F (36.4 C)  TempSrc: Oral Axillary  Oral  SpO2: 91% 94%  92%  Weight:   85.3 kg   Height:        Intake/Output Summary (Last 24 hours) at 08/27/2022 0907 Last data filed at 08/27/2022 0000 Gross per 24 hour  Intake 600 ml  Output 3.1 ml  Net 596.9 ml       08/27/2022   12:24 AM 08/26/2022    8:10 AM 08/26/2022    6:05 AM  Last 3 Weights  Weight (lbs) 188 lb 195 lb 8.8 oz 191 lb 4.8 oz  Weight (kg) 85.276 kg 88.7 kg 86.773 kg      Telemetry    Sinus/sinus tachycardia- Personally Reviewed  ECG    No ECG today  Physical Exam   GEN: No acute distress.   Neck: No JVD Cardiac: RRR, no murmurs, rubs, or gallops.  Respiratory: CTAB GI: Soft, nontender, non-distended  MS: No lower extremity edema; No deformity. Neuro:  Nonfocal    Labs    High Sensitivity Troponin:   Recent  Labs  Lab 08/23/22 2123 08/24/22 0440  TROPONINIHS 399* 326*      Chemistry Recent Labs  Lab 08/24/22 1024 08/25/22 0416 08/26/22 0824  NA 139 138 138  K 4.7 4.0 3.9  CL 102 100 100  CO2 21* 24 24  GLUCOSE 130* 104* 106*  BUN 76* 49* 68*  CREATININE 11.66* 8.48* 10.02*  CALCIUM 9.6 9.8 10.1  ALBUMIN 2.7* 2.5* 2.6*  GFRNONAA 4* 6* 5*  ANIONGAP 16* 14 14     Lipids No results for input(s): "CHOL", "TRIG", "HDL", "LABVLDL", "LDLCALC", "CHOLHDL" in the last 168 hours.  Hematology Recent Labs  Lab 08/23/22 2123 08/24/22 1024 08/25/22 0416  WBC 9.6 9.3 8.2  RBC 2.66* 2.49* 2.62*  HGB 8.2* 7.8* 7.9*  HCT 26.7* 24.7* 26.4*  MCV 100.4* 99.2 100.8*  MCH 30.8 31.3 30.2  MCHC 30.7 31.6 29.9*  RDW 16.0* 16.1* 16.0*  PLT 324 298 307    Thyroid No results for input(s): "TSH", "FREET4" in the last 168 hours.  BNP Recent Labs  Lab 08/24/22 1024  BNP 3,410.3*     DDimer No results for input(s): "DDIMER" in the last 168 hours.  Radiology    ECHOCARDIOGRAM COMPLETE  Result Date: 08/25/2022    ECHOCARDIOGRAM REPORT   Patient Name:   [name] Date of Exam: 08/25/2022 Medical Rec #:  [id number]         Height:       68.0 in Accession #:    [id number]        Weight:       190.3 lb Date of Birth:  May 06, 1996         BSA:          2.001 m Patient Age:    26 years          BP:           149/96 mmHg Patient Gender: F                 HR:           106 bpm. Exam Location:  Inpatient Procedure: 2D Echo, 3D Echo, Cardiac Doppler and Color Doppler Indications:    Chest pain  History:        Patient has prior history of Echocardiogram examinations.                 Signs/Symptoms:Chest Pain; Risk Factors:Hypertension. CKD.                 Polysubstance abuse.  Sonographer:    Clayton Lefort RDCS (AE) Referring Phys: [id number] RONDELL A SMITH IMPRESSIONS  1. Left ventricular ejection fraction, by estimation, is 45 to 50%. Left ventricular ejection fraction by 3D volume is 44 %. The left  ventricle has mildly decreased function. The left ventricle demonstrates global hypokinesis. The left ventricular internal cavity size was mildly dilated. There is severe concentric left ventricular hypertrophy. Left ventricular diastolic parameters are indeterminate.  2. Right ventricular systolic function is normal. The right ventricular size is moderately enlarged. There is severely elevated pulmonary artery systolic pressure.  3. Moderate pericardial effusion. The pericardial effusion is circumferential. Large pleural effusion in the left lateral region.  4. The mitral valve is degenerative. Severe mitral valve regurgitation.  5. The tricuspid valve is abnormal. 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  6. The aortic valve is abnormal. There is mild calcification of the aortic valve. Aortic valve regurgitation is not visualized. Aortic valve mean gradient measures 13.0 mmHg. Comparison(s): LVEF similar to prior. Pericardial effusion is similar to prior. Valve disease has worsened. Limited repeat study when euvolemic would be reasonable. FINDINGS  Left Ventricle: Left ventricular ejection fraction, by estimation, is 45 to 50%. Left ventricular ejection fraction by 3D volume is 44 %. The left ventricle has mildly decreased function. The left ventricle demonstrates global hypokinesis. The left ventricular internal cavity size was mildly dilated. There is severe concentric left ventricular hypertrophy. Left ventricular diastolic parameters are indeterminate. Right Ventricle: The right ventricular size is moderately enlarged. No increase in right ventricular wall thickness. Right ventricular systolic function is normal. There is severely elevated pulmonary artery systolic pressure. The tricuspid regurgitant velocity is 3.54 m/s, and with an assumed right atrial pressure of 15 mmHg, the estimated right ventricular systolic pressure is 40.1 mmHg. Left Atrium: Left atrial size was normal in size. Right Atrium: Right  atrial size was normal in size. Pericardium: A moderately sized pericardial effusion is present. The pericardial effusion is circumferential. Mitral Valve: The mitral valve is degenerative in appearance. Severe mitral valve regurgitation. MV peak gradient, 15.8 mmHg. The mean mitral valve gradient is 7.0 mmHg with average heart rate of 105  bpm. Tricuspid Valve: The tricuspid valve is abnormal. 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 Aortic Valve: The aortic valve is abnormal. There is mild calcification of the aortic valve. Aortic valve regurgitation is not visualized. Aortic valve mean gradient measures 13.0 mmHg. Aortic valve peak gradient measures 24.4 mmHg. Aortic valve area, by  VTI measures 1.91 cm. Pulmonic Valve: The pulmonic valve was normal in structure. Pulmonic valve regurgitation is not visualized. No evidence of pulmonic stenosis. Aorta: The aortic root and ascending aorta are structurally normal, with no evidence of dilitation. IAS/Shunts: No atrial level shunt detected by color flow Doppler. Additional Comments: There is a large pleural effusion in the left lateral region.  LEFT VENTRICLE PLAX 2D LVIDd:         5.40 cm         Diastology LVIDs:         4.10 cm         LV e' medial:    6.53 cm/s LV PW:         1.80 cm         LV E/e' medial:  25.9 LV IVS:        1.90 cm         LV e' lateral:   9.03 cm/s LVOT diam:     2.10 cm         LV E/e' lateral: 18.7 LV SV:         70 LV SV Index:   35 LVOT Area:     3.46 cm        3D Volume EF                                LV 3D EF:    Left                                             ventricul LV Volumes (MOD)                            ar LV vol d, MOD    154.0 ml                   ejection A4C:                                        fraction LV vol s, MOD    82.1 ml                    by 3D A4C:                                        volume is LV SV MOD A4C:   154.0 ml                   44 %.                                 3D Volume EF:  3D EF:        44 %                                LV EDV:       262 ml                                LV ESV:       146 ml                                LV SV:        115 ml RIGHT VENTRICLE             IVC RV Basal diam:  4.90 cm     IVC diam: 2.30 cm RV Mid diam:    4.10 cm RV S prime:     17.40 cm/s TAPSE (M-mode): 2.0 cm LEFT ATRIUM             Index        RIGHT ATRIUM           Index LA diam:        3.50 cm 1.75 cm/m   RA Area:     20.00 cm LA Vol (A2C):   61.0 ml 30.49 ml/m  RA Volume:   56.80 ml  28.39 ml/m LA Vol (A4C):   58.1 ml 29.04 ml/m LA Biplane Vol: 62.1 ml 31.04 ml/m  AORTIC VALVE AV Area (Vmax):    1.92 cm AV Area (Vmean):   1.90 cm AV Area (VTI):     1.91 cm AV Vmax:           247.00 cm/s AV Vmean:          170.000 cm/s AV VTI:            0.368 m AV Peak Grad:      24.4 mmHg AV Mean Grad:      13.0 mmHg LVOT Vmax:         137.00 cm/s LVOT Vmean:        93.500 cm/s LVOT VTI:          0.203 m LVOT/AV VTI ratio: 0.55  AORTA Ao Root diam: 3.00 cm Ao Asc diam:  3.20 cm MITRAL VALVE                TRICUSPID VALVE MV Area (PHT): 6.48 cm     TR Peak grad:   50.1 mmHg MV Area VTI:   1.72 cm     TR Vmax:        354.00 cm/s MV Peak grad:  15.8 mmHg MV Mean grad:  7.0 mmHg     SHUNTS MV Vmax:       1.99 m/s     Systemic VTI:  0.20 m MV Vmean:      128.0 cm/s   Systemic Diam: 2.10 cm MV Decel Time: 117 msec MR Peak grad: 146.9 mmHg MR Mean grad: 89.0 mmHg MR Vmax:      606.00 cm/s MR Vmean:     435.0 cm/s MV E velocity: 169.00 cm/s MV A velocity: 137.00 cm/s MV E/A ratio:  1.23 Rudean Haskell MD Electronically signed by Rudean Haskell MD Signature Date/Time: 08/25/2022/5:44:51 PM    Final     Cardiac Studies  05/03/2022 TTE (limited)   IMPRESSIONS     1. Left ventricular ejection fraction, by estimation, is 45 to 50%. The  left ventricle has mildly decreased function.   2. Right ventricular systolic function is normal. The right ventricular  size is normal.   3.  The inferior vena cava is dilated in size with <50% respiratory  variability, suggesting right atrial pressure of 15 mmHg.   4. Moderate pericardial effusion, measures 1.1 cm adjacent to LV lateral  wall. IVC fixed/dilated, but no RA/RV collapse seen to suggest tamponade   FINDINGS   Left Ventricle: Left ventricular ejection fraction, by estimation, is 45  to 50%. The left ventricle has mildly decreased function.   Right Ventricle: The right ventricular size is normal. Right ventricular  systolic function is normal.   Pericardium: A moderately sized pericardial effusion is present.   Venous: The inferior vena cava is dilated in size with less than 50%  respiratory variability, suggesting right atrial pressure of 15 mmHg.   08/26/22 TTE  IMPRESSIONS     1. Left ventricular ejection fraction, by estimation, is 45 to 50%. Left  ventricular ejection fraction by 3D volume is 44 %. The left ventricle has  mildly decreased function. The left ventricle demonstrates global  hypokinesis. The left ventricular  internal cavity size was mildly dilated. There is severe concentric left  ventricular hypertrophy. Left ventricular diastolic parameters are  indeterminate.   2. Right ventricular systolic function is normal. The right ventricular  size is moderately enlarged. There is severely elevated pulmonary artery  systolic pressure.   3. Moderate pericardial effusion. The pericardial effusion is  circumferential. Large pleural effusion in the left lateral region.   4. The mitral valve is degenerative. Severe mitral valve regurgitation.   5. The tricuspid valve is abnormal. Tricuspid valve regurgitation is  severe.   6. The aortic valve is abnormal. There is mild calcification of the  aortic valve. Aortic valve regurgitation is not visualized. Aortic valve  mean gradient measures 13.0 mmHg.   Comparison(s): LVEF similar to prior. Pericardial effusion is similar to  prior. Valve disease has  worsened. Limited repeat study when euvolemic  would be reasonable.   FINDINGS   Left Ventricle: Left ventricular ejection fraction, by estimation, is 45  to 50%. Left ventricular ejection fraction by 3D volume is 44 %. The left  ventricle has mildly decreased function. The left ventricle demonstrates  global hypokinesis. The left  ventricular internal cavity size was mildly dilated. There is severe  concentric left ventricular hypertrophy. Left ventricular diastolic  parameters are indeterminate.   Right Ventricle: The right ventricular size is moderately enlarged. No  increase in right ventricular wall thickness. Right ventricular systolic  function is normal. There is severely elevated pulmonary artery systolic  pressure. The tricuspid regurgitant  velocity is 3.54 m/s, and with an assumed right atrial pressure of 15  mmHg, the estimated right ventricular systolic pressure is 17.6 mmHg.   Left Atrium: Left atrial size was normal in size.   Right Atrium: Right atrial size was normal in size.   Pericardium: A moderately sized pericardial effusion is present. The  pericardial effusion is circumferential.   Mitral Valve: The mitral valve is degenerative in appearance. Severe  mitral valve regurgitation. MV peak gradient, 15.8 mmHg. The mean mitral  valve gradient is 7.0 mmHg with average heart rate of 105 bpm.   Tricuspid Valve: The tricuspid valve is abnormal. Tricuspid valve  regurgitation is severe.   Aortic Valve: The  aortic valve is abnormal. There is mild calcification of  the aortic valve. Aortic valve regurgitation is not visualized. Aortic  valve mean gradient measures 13.0 mmHg. Aortic valve peak gradient  measures 24.4 mmHg. Aortic valve area, by   VTI measures 1.91 cm.   Pulmonic Valve: The pulmonic valve was normal in structure. Pulmonic valve  regurgitation is not visualized. No evidence of pulmonic stenosis.   Aorta: The aortic root and ascending aorta are  structurally normal, with  no evidence of dilitation.   IAS/Shunts: No atrial level shunt detected by color flow Doppler.   Additional Comments: There is a large pleural effusion in the left lateral  region.    Patient Profile    Natasha Long is a 26 y.o. 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 Tamala Julian.  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 CXR with mild cardiogenic failure, bilateral small pleural effusions. Questionable right basilar loculated effusion. June 2023 echo with LVEF 45-50%, moderate pericardial effusion. This was medically managed. Repeat echo 9/26 with stable LVEF 45-50%. Severe MR noted. Patient completed HD 9/25, 3066mL removed. Suspect that patient's volume status is 2/2 to missed dialysis sessions and being off of regular prescriptions. Volume clearly improved. Underwent HD again 9/27 Patient is not on any GDMT, likely due to cocaine use, ESRD, and access issues. Coreg 6.25mg  BID initiated with limited improvement to BP. Increase to 12.5 mg BID. Added daily Imdur ER 30mg  and Hydralazine 25mg  TID for afterload reduction.   Cessation of cocaine use strongly encouraged. Discussed with patient that this exacerbates HF.   Severe MR Echo this admission with severe mitral valve regurgitation. Some of this is likely related to patient's volume status. Will plan to reassess with limited echo after patient has been dialyzed to euvolemia.  Volume status is improved, suspect likely can recheck echo after HD tomorrow. Given patient's  history of substance abuse, will also check blood cultures.  Unfortunately patient has been refusing labs but states she is now agreeable to having blood cultures drawn.  Chest pain Patient with chest pain on admission, troponin of 399 and 326. Strongly suspect pain and troponin leak are both secondary to demand ischemia in ESRD patient with missed dialysis, missing chronic medications, and cocaine use. Echo with LVEF 45-50%, stable with June 2023 TTE. Global hypokinesis with severe LV  hypertrophy.   Given downtrending troponin, stable echo and no acute ECG changes, no need for acute ischemic evaluation.  Pericardial effusion Patient with moderate pericardial effusion noted June 2023. Stable appearing on repeat echo this admission. Plan limited echo to reassess upon patient reaching euvolemia 2/2 dialysis.   ESRD on HD Hemodialysis per primary team/nephrology. Continue Calcitriol, [place], PhosLo.     Per primary team: Cough Anemia of chronic disease Mood disorder    For questions or updates, please contact [place] Please consult [email or web address] for contact info under        Signed, Donato Heinz, MD  08/27/2022, 9:07 AM

## 2022-08-27 NOTE — Hospital Course (Addendum)
Natasha Long is a 26 y.o. 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 Patient presented secondary to chest pain and shortness of breath with associated productive cough. On admission, patient was found to have evidence of fluid overload secondary to hemodialysis non-adherence. Chest x-ray suggests possible pneumonia and patient was started on empiric antibiotics. Nephrology and cardiology consulted and patient started hemodialysis for treatment.  Patient treated with antibiotics and left against medical advice prior to discharge.

## 2022-08-28 ENCOUNTER — Inpatient Hospital Stay (HOSPITAL_COMMUNITY): Payer: Medicaid Other

## 2022-08-28 DIAGNOSIS — R079 Chest pain, unspecified: Secondary | ICD-10-CM | POA: Diagnosis not present

## 2022-08-28 DIAGNOSIS — N186 End stage renal disease: Secondary | ICD-10-CM | POA: Diagnosis not present

## 2022-08-28 DIAGNOSIS — I340 Nonrheumatic mitral (valve) insufficiency: Secondary | ICD-10-CM | POA: Diagnosis not present

## 2022-08-28 DIAGNOSIS — R778 Other specified abnormalities of plasma proteins: Secondary | ICD-10-CM | POA: Diagnosis not present

## 2022-08-28 DIAGNOSIS — I3139 Other pericardial effusion (noninflammatory): Secondary | ICD-10-CM | POA: Diagnosis not present

## 2022-08-28 DIAGNOSIS — I5023 Acute on chronic systolic (congestive) heart failure: Secondary | ICD-10-CM | POA: Diagnosis not present

## 2022-08-28 LAB — RPR: RPR Ser Ql: NONREACTIVE

## 2022-08-28 LAB — HIV ANTIBODY (ROUTINE TESTING W REFLEX): HIV Screen 4th Generation wRfx: NONREACTIVE

## 2022-08-28 MED ORDER — PROSOURCE PLUS PO LIQD: 30.0000 mL | Freq: Two times a day (BID) | ORAL | Status: DC

## 2022-08-28 MED ORDER — RENA-VITE PO TABS
1.0000 | ORAL_TABLET | Freq: Every day | ORAL | Status: DC
  Administered 2022-08-28: 1 via ORAL
  Filled 2022-08-28: qty 1

## 2022-08-28 NOTE — Progress Notes (Signed)
PROGRESS NOTE    Natasha Long  TFT:[id number] DOB: [birth year] DOA: 08/23/2022 PCP: [name], MD   Brief Narrative: Natasha Long is a 26 y.o. 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 Patient presented secondary to chest pain and shortness of breath with associated productive cough. On admission, patient was found to have evidence of fluid overload secondary to hemodialysis non-adherence. Chest x-ray suggests possible pneumonia and patient was started on empiric antibiotics. Nephrology and cardiology consulted and patient started hemodialysis for treatment.    Assessment and Plan:  Chest pain EKG without ischemic changes. Troponin of 399 > 326. Cardiology consulted. Transthoracic Echocardiogram significant for an LVEF of 45-50%. Presumed secondary to fluid overload. Cardiology with recommendations for continued volume management with HD.  Demand ischemia Per cardiology, likely secondary to fluid overload.  ESRD on HD Nephrology consulted and are performing intermittent hemodialysis on MWF  Chronic pericardial effusion Presumed secondary to fluid overload from missed hemodialysis, although imaging suggests effusion is chronic from March 2023. Cardiology recommending continued hemodialysis and repeat limited Transthoracic Echocardiogram once more euvolemic.  Acute on chronic HFrEF Fluid overload Secondary to missed hemodialysis. Management via hemodialysis.  Anemia of chronic kidney disease Stable.  Possible community acquired pneumonia Chest x-ray concerning for possible infiltrate vs atelectasis and patient has associated cough. Procalcitonin of 0.9. Patient started empirically on Ceftriaxone and doxycycline.  Uncontrolled hypertension -Continue Coreg, hydralazine, Imdur  Polysubstance abuse Patient uses marijuana, cocaine and tobacco.  Patient counseled.  Left wrist pain History of septic arthritis. X-ray imaging now shows possible degenerative arthritis. Supportive care initiated with topical analgesics and wrist brace.  High risk sexual activity Patient with concern for possible STI infection. High risk sexual practices with multiple partners and no condom use. History of syphilis. RPR and HIV non-reactive. Patient is on Ceftriaxone and doxycycline for concern of pneumonia. -Wet prep  Homelessness Complicates discharge. TOC consulted for resources.   DVT prophylaxis: Heparin subq Code Status:   Code Status: Full Code Family Communication: None at bedside Disposition Plan: Discharge pending nephrology recommendations   Consultants:  Nephrology Cardiology  Procedures:  Hemodialysis Transthoracic Echocardiogram  Antimicrobials: Ceftriaxone Doxycycline    Subjective: Patient reports thin, white vaginal discharge without pain or itching.  Objective: BP (!) 146/125 (BP Location: Right Arm)   Pulse 100   Temp 97.9 F (36.6 C) (Oral)   Resp 19   Ht 5\' 8"  (1.727 m)   Wt 85.3 kg   SpO2 93%   BMI 28.59 kg/m   Examination:  General exam: Appears calm and comfortable Respiratory system: Clear to auscultation. Respiratory effort normal. Cardiovascular system: S1 & S2 heard, RRR. Systolic murmur Gastrointestinal system: Abdomen is nondistended, soft and nontender. Normal bowel sounds heard. Central nervous system: Alert and oriented. No focal neurological deficits. Musculoskeletal: BLE 1+ pitting edema.   Data Reviewed: I have personally reviewed following labs and imaging studies  CBC Lab Results  Component Value Date   WBC 8.2 08/25/2022   RBC 2.62 (L) 08/25/2022   HGB 7.9 (L) 08/25/2022   HCT 26.4 (L) 08/25/2022   MCV 100.8 (H) 08/25/2022   MCH 30.2 08/25/2022   PLT 307 08/25/2022   MCHC 29.9 (L) 08/25/2022   RDW 16.0 (H) 08/25/2022   LYMPHSABS 1.4 06/30/2022   MONOABS 0.6 06/30/2022    EOSABS 0.2 06/30/2022   BASOSABS 0.0 70/62/3762     Last metabolic panel Lab Results  Component  Value Date   NA 138 08/26/2022   K 3.9 08/26/2022   CL 100 08/26/2022   CO2 24 08/26/2022   BUN 68 (H) 08/26/2022   CREATININE 10.02 (H) 08/26/2022   GLUCOSE 106 (H) 08/26/2022   GFRNONAA 5 (L) 08/26/2022   GFRAA 27 (L) 01/30/2020   CALCIUM 10.1 08/26/2022   PHOS 7.2 (H) 08/26/2022   PROT 6.8 06/26/2022   ALBUMIN 2.6 (L) 08/26/2022   LABGLOB 2.7 01/30/2020   AGRATIO 0.9 (L) 01/30/2020   BILITOT 0.4 06/26/2022   ALKPHOS 107 06/26/2022   AST 59 (H) 06/26/2022   ALT 47 (H) 06/26/2022   ANIONGAP 14 08/26/2022    GFR: Estimated Creatinine Clearance: 9.6 mL/min (A) (by C-G formula based on SCr of 10.02 mg/dL (H)).  Recent Results (from the past 240 hour(s))  MRSA Next Gen by PCR, Nasal     Status: Abnormal   Collection Time: 08/27/22 10:08 AM   Specimen: Nasal Mucosa; Nasal Swab  Result Value Ref Range Status   MRSA by PCR Next Gen DETECTED (A) NOT DETECTED Final    Comment: RESULT CALLED TO, READ BACK BY AND VERIFIED WITH: RN TRaymond Gurney [phone] @[phone]  FH        The GeneXpert MRSA Assay (FDA approved for NASAL specimens only), is one component of a comprehensive MRSA colonization surveillance program. It is not intended to diagnose MRSA infection nor to guide or monitor treatment for MRSA infections. Performed at [facility], [place] [address], [place], [place] [address]   Culture, blood (Routine X 2) w Reflex to ID Panel     Status: None (Preliminary result)   Collection Time: 08/27/22 11:33 AM   Specimen: BLOOD RIGHT HAND  Result Value Ref Range Status   Specimen Description BLOOD RIGHT HAND  Final   Special Requests   Final    BOTTLES DRAWN AEROBIC AND ANAEROBIC Blood Culture adequate volume   Culture   Final    NO GROWTH < 24 HOURS Performed at [facility], [place] [address] [place], [place] [address]    Report Status PENDING  Incomplete  Culture, blood  (Routine X 2) w Reflex to ID Panel     Status: None (Preliminary result)   Collection Time: 08/27/22 11:40 AM   Specimen: BLOOD RIGHT HAND  Result Value Ref Range Status   Specimen Description BLOOD RIGHT HAND  Final   Special Requests   Final    BOTTLES DRAWN AEROBIC AND ANAEROBIC Blood Culture adequate volume   Culture   Final    NO GROWTH < 24 HOURS Performed at [facility], [place] [address], [place], [place] [address]    Report Status PENDING  Incomplete      Radiology Studies: No results found.    LOS: 4 days    Cordelia Poche, MD [facility] 08/28/2022, 9:35 AM   If 7PM-7AM, please contact night-coverage [email or web address]

## 2022-08-28 NOTE — Discharge Instructions (Signed)
  Nutrition for People on Dialysis  Protein You need a lot of protein! Choose 2 to 3 palm-sized portions of protein foods each day. These include 2-3 eggs or egg whites, fresh lean beef, lamb, veal, wild game, and "all natural" chicken, fish, pork, seafood, or turkey. Beans, edamame, lentils, nut butters, or tofu may be good options in meatless meals. Limit salty processed meats such as bacon, brats, deli meats, ham, hot dogs, and sausage.  Salt & Sodium Choose foods with less than 200 mg sodium per serving. Packaged meals should have less than 600 mg per serving. Do not add salt to foods. Instead, use herbs and spices such as garlic, onion or garlic powder, basil, oregano, paprika, pepper, or thyme. You can also flavor food with lemon, lime, vinegar, or a salt-free seasoning blend like Mrs. Dash.  Added Phosphorus Limit foods with added phosphorus (any words with "phos", such as calcium phosphate or phosphoric acid, in the ingredients).   Dairy Products Limit milk, ice cream, pudding, or yogurt to 4-8 ounces ( to 1 cup) per day or 1 slice of natural cheese (such as cheddar, mozzarella, or Swiss). You can use unfortified rice, almond, or soy milks instead. Limit processed cheeses, such as American cheese, Cheez Whiz, Velveeta, boxed macaroni and cheese, and other cheese spreads or sauces with "phos" ingredients.   You can enjoy many foods when you have chronic kidney disease. Limiting a few others can also help you be healthy. Try these tips:  Fruits Enjoy 2-3 servings per day ( cup or 1 small fruit per serving):  Lower Potassium:   apples   applesauce   berries   canned fruit   clementine   grapes   lemon & lime   mandarin oranges   pear   pineapple   plum   Tangerine  Higher Potassium:   avocado   banana   dried fruit   kiwi   mango   most melons   nectarine   orange   papaya   peach   plantain   pomegranate   Vegetables Enjoy 2-3 servings per day (1 cup  leafy greens or  cup fresh, cooked, or canned per serving):  Lower Potassium:   asparagus   broccoli   cabbage   carrots   cauliflower   celery   corn   cucumber   eggplant   green beans   greens: collard, mustard, or turnip   kale   lettuce   okra   onion   peppers   radish   sweet or snap peas   Turnip  Higher Potassium:   artichoke   brussels sprouts   cooked chard   kohlrabi   parsnips   potato   pumpkin   rutabaga   squash, most types   sweet potatoes or yams   tomatoes or tomato sauce   zucchini  Eat the Right Kinds of Carbohydrates Choose white or whole grain breads and grains, lowersalt crackers, or snacks. Limit processed foods such as boxed mixes, biscuits, muffins, pancakes, waffles, instant hot cereals, and ready-to-eat baked goods.  Fluids If you make little urine, you may drink up to 4-6 cups of liquids a day. This includes all beverages and ice. It also includes the fluid in foods such as gelatin, ice cream, popsicles, and soup.  If you have diabetes, follow your diabetes meal and snack plan with the above diet changes. Do not use orange juice to treat low blood sugars. It is high   in potassium. Instead, choose glucose tabs or gel, or  cup regular cranberry grape, or apple juice or 7-Up or Sprite.  Renal Dietitians (RPG) 2019. May be reproduced for educational purposes. http://www.renalnutrition.org  

## 2022-08-28 NOTE — Progress Notes (Signed)
Pt unable to start at [facility] tomorrow. Pt can start at clinic on Tuesday and pt will need to arrive at 12:00 to complete paperwork prior to 12:40 chair time. Pt made aware of this info earlier this week and provided schedule letter as well. Will alert renal PA of clinic's need for orders if pt d/c this weekend.   Melven Sartorius Renal Navigator [phone]

## 2022-08-28 NOTE — Progress Notes (Signed)
Pt refused to get vital signs taken.

## 2022-08-28 NOTE — Progress Notes (Signed)
Rounding Note    Patient Name: Natasha Long Date of Encounter: 08/28/2022  [place] Cardiologist: Sinclair Grooms, MD   Subjective   Reports feeling more short of breath today  Inpatient Medications    Scheduled Meds:  calcitRIOL  0.25 mcg Oral Q T,Th,Sa-HD   calcium acetate  1,334 mg Oral TID WC   carvedilol  12.5 mg Oral BID WC   Chlorhexidine Gluconate Cloth  6 each Topical Q0600   doxycycline  100 mg Oral Q12H   heparin  5,000 Units Subcutaneous Q8H   hydrALAZINE  25 mg Oral Q8H   isosorbide mononitrate  30 mg Oral Daily   sodium chloride flush  3 mL Intravenous Q12H   Continuous Infusions:  cefTRIAXone (ROCEPHIN)  IV 2 g (08/27/22 1811)   PRN Meds: acetaminophen **OR** acetaminophen, albuterol, benzonatate, diclofenac Sodium, HYDROcodone bit-homatropine, hydrOXYzine, ondansetron **OR** ondansetron (ZOFRAN) IV   Vital Signs    Vitals:   08/27/22 0024 08/27/22 0635 08/27/22 1943 08/28/22 0614  BP:  (!) 149/104 (!) 143/99 (!) 146/125  Pulse:  (!) 103 100   Resp:  20 20 19   Temp:  97.6 F (36.4 C) 97.9 F (36.6 C) 97.9 F (36.6 C)  TempSrc:  Oral Oral Oral  SpO2:  92% 90% 93%  Weight: 85.3 kg     Height:       No intake or output data in the 24 hours ending 08/28/22 1028     08/27/2022   12:24 AM 08/26/2022    8:10 AM 08/26/2022    6:05 AM  Last 3 Weights  Weight (lbs) 188 lb 195 lb 8.8 oz 191 lb 4.8 oz  Weight (kg) 85.276 kg 88.7 kg 86.773 kg      Telemetry    Sinus- Personally Reviewed  ECG    No ECG today  Physical Exam   GEN: No acute distress.   Neck: + JVD Cardiac: RRR, no murmurs, rubs, or gallops.  Respiratory: CTAB GI: Soft, nontender, non-distended  MS: No lower extremity edema; No deformity. Neuro:  Nonfocal    Labs    High Sensitivity Troponin:   Recent Labs  Lab 08/23/22 2123 08/24/22 0440  TROPONINIHS 399* 326*      Chemistry Recent Labs  Lab 08/24/22 1024 08/25/22 0416 08/26/22 0824  NA  139 138 138  K 4.7 4.0 3.9  CL 102 100 100  CO2 21* 24 24  GLUCOSE 130* 104* 106*  BUN 76* 49* 68*  CREATININE 11.66* 8.48* 10.02*  CALCIUM 9.6 9.8 10.1  ALBUMIN 2.7* 2.5* 2.6*  GFRNONAA 4* 6* 5*  ANIONGAP 16* 14 14     Lipids No results for input(s): "CHOL", "TRIG", "HDL", "LABVLDL", "LDLCALC", "CHOLHDL" in the last 168 hours.  Hematology Recent Labs  Lab 08/23/22 2123 08/24/22 1024 08/25/22 0416  WBC 9.6 9.3 8.2  RBC 2.66* 2.49* 2.62*  HGB 8.2* 7.8* 7.9*  HCT 26.7* 24.7* 26.4*  MCV 100.4* 99.2 100.8*  MCH 30.8 31.3 30.2  MCHC 30.7 31.6 29.9*  RDW 16.0* 16.1* 16.0*  PLT 324 298 307    Thyroid No results for input(s): "TSH", "FREET4" in the last 168 hours.  BNP Recent Labs  Lab 08/24/22 1024  BNP 3,410.3*     DDimer No results for input(s): "DDIMER" in the last 168 hours.   Radiology    No results found.  Cardiac Studies   05/03/2022 TTE (limited)   IMPRESSIONS     1. Left ventricular ejection fraction, by  estimation, is 45 to 50%. The  left ventricle has mildly decreased function.   2. Right ventricular systolic function is normal. The right ventricular  size is normal.   3. The inferior vena cava is dilated in size with <50% respiratory  variability, suggesting right atrial pressure of 15 mmHg.   4. Moderate pericardial effusion, measures 1.1 cm adjacent to LV lateral  wall. IVC fixed/dilated, but no RA/RV collapse seen to suggest tamponade   FINDINGS   Left Ventricle: Left ventricular ejection fraction, by estimation, is 45  to 50%. The left ventricle has mildly decreased function.   Right Ventricle: The right ventricular size is normal. Right ventricular  systolic function is normal.   Pericardium: A moderately sized pericardial effusion is present.   Venous: The inferior vena cava is dilated in size with less than 50%  respiratory variability, suggesting right atrial pressure of 15 mmHg.   08/26/22 TTE  IMPRESSIONS     1. Left  ventricular ejection fraction, by estimation, is 45 to 50%. Left  ventricular ejection fraction by 3D volume is 44 %. The left ventricle has  mildly decreased function. The left ventricle demonstrates global  hypokinesis. The left ventricular  internal cavity size was mildly dilated. There is severe concentric left  ventricular hypertrophy. Left ventricular diastolic parameters are  indeterminate.   2. Right ventricular systolic function is normal. The right ventricular  size is moderately enlarged. There is severely elevated pulmonary artery  systolic pressure.   3. Moderate pericardial effusion. The pericardial effusion is  circumferential. Large pleural effusion in the left lateral region.   4. The mitral valve is degenerative. Severe mitral valve regurgitation.   5. The tricuspid valve is abnormal. Tricuspid valve regurgitation is  severe.   6. The aortic valve is abnormal. There is mild calcification of the  aortic valve. Aortic valve regurgitation is not visualized. Aortic valve  mean gradient measures 13.0 mmHg.   Comparison(s): LVEF similar to prior. Pericardial effusion is similar to  prior. Valve disease has worsened. Limited repeat study when euvolemic  would be reasonable.   FINDINGS   Left Ventricle: Left ventricular ejection fraction, by estimation, is 45  to 50%. Left ventricular ejection fraction by 3D volume is 44 %. The left  ventricle has mildly decreased function. The left ventricle demonstrates  global hypokinesis. The left  ventricular internal cavity size was mildly dilated. There is severe  concentric left ventricular hypertrophy. Left ventricular diastolic  parameters are indeterminate.   Right Ventricle: The right ventricular size is moderately enlarged. No  increase in right ventricular wall thickness. Right ventricular systolic  function is normal. There is severely elevated pulmonary artery systolic  pressure. The tricuspid regurgitant  velocity is  3.54 m/s, and with an assumed right atrial pressure of 15  mmHg, the estimated right ventricular systolic pressure is 16.9 mmHg.   Left Atrium: Left atrial size was normal in size.   Right Atrium: Right atrial size was normal in size.   Pericardium: A moderately sized pericardial effusion is present. The  pericardial effusion is circumferential.   Mitral Valve: The mitral valve is degenerative in appearance. Severe  mitral valve regurgitation. MV peak gradient, 15.8 mmHg. The mean mitral  valve gradient is 7.0 mmHg with average heart rate of 105 bpm.   Tricuspid Valve: The tricuspid valve is abnormal. Tricuspid valve  regurgitation is severe.   Aortic Valve: The aortic valve is abnormal. There is mild calcification of  the aortic valve. Aortic valve regurgitation  is not visualized. Aortic  valve mean gradient measures 13.0 mmHg. Aortic valve peak gradient  measures 24.4 mmHg. Aortic valve area, by   VTI measures 1.91 cm.   Pulmonic Valve: The pulmonic valve was normal in structure. Pulmonic valve  regurgitation is not visualized. No evidence of pulmonic stenosis.   Aorta: The aortic root and ascending aorta are structurally normal, with  no evidence of dilitation.   IAS/Shunts: No atrial level shunt detected by color flow Doppler.   Additional Comments: There is a large pleural effusion in the left lateral  region.    Patient Profile    Miah Boye is a 26 y.o. 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 Tamala Julian.  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 CXR with mild cardiogenic failure, bilateral small pleural effusions. Questionable right basilar loculated effusion. June 2023 echo with LVEF 45-50%, moderate pericardial effusion. This was medically managed. Repeat echo 9/26 with stable LVEF 45-50%. Severe MR noted. Suspect that patient's volume status is 2/2 to missed dialysis sessions. Volume status improving with regular HD Patient is not on any GDMT, likely due to cocaine use, ESRD, and access issues. Coreg 6.25mg  BID initiated with limited improvement to BP. Increased to 12.5 mg BID. Added daily Imdur ER 30mg  and Hydralazine 25mg  TID for afterload reduction.   Cessation of cocaine use strongly encouraged. Discussed with patient that this exacerbates HF.   Severe MR Echo this admission with severe mitral valve regurgitation. Some of this is likely related to patient's volume status. Will plan to reassess with limited echo after patient has been dialyzed to euvolemia.  Volume status is improved, will plan to recheck echo after HD tomorrow. Given patient's history of substance abuse and on HD, will also check blood cultures.  Blood cultures NGTD.  Chest pain Patient with chest pain on admission, troponin of 399 and 326. Strongly suspect pain and troponin leak are both secondary to demand ischemia in ESRD patient with missed dialysis, missing chronic medications, and cocaine use. Echo with LVEF 45-50%, stable with June 2023 TTE. Global hypokinesis with severe LV  hypertrophy.   Given downtrending troponin, stable echo and no acute ECG changes, no need for acute ischemic evaluation.  Pericardial effusion Patient with moderate pericardial effusion noted June 2023. Stable appearing on repeat echo this admission. Plan limited echo to reassess upon patient reaching euvolemia 2/2 dialysis.   ESRD on HD Hemodialysis per primary team/nephrology. Continue Calcitriol, [place], PhosLo.     Per primary  team: Cough Anemia of chronic disease Mood disorder    For questions or updates, please contact [place] Please consult [email or web address] for contact info under        Signed, Donato Heinz, MD  08/28/2022, 10:28 AM

## 2022-08-28 NOTE — Progress Notes (Signed)
Initial Nutrition Assessment  DOCUMENTATION CODES:   Not applicable  INTERVENTION:   Continue renal diet with 1200 ml fluid restriction. Diet education handout attached to discharge summary.   Add Prosource Plus 30 ml PO BID, each packet provides 100 kcal and 15 gm protein.  Add renal MVI daily.  Consider adding scheduled bowel regimen as no BM documented since admission.   NUTRITION DIAGNOSIS:   Increased nutrient needs related to chronic illness (ESRD on HD) as evidenced by estimated needs.  GOAL:   Patient will meet greater than or equal to 90% of their needs  MONITOR:   PO intake, Supplement acceptance, Labs, I & O's  REASON FOR ASSESSMENT:   Malnutrition Screening Tool    ASSESSMENT:   26 yo female admitted with fluid overload d/t HD non-adherence. PMH includes ESRD on HD secondary to nephrotic syndrome, HTN, insomnia, PTSD, peripheral neuropathy, polysubstance abuse, mood disorder.  ECHO this admission showed severe mitral valve regurgitation. Patient was volume overloaded on admission, which has now improved with dialysis. Last HD 9/27 with 3.1 L volume removal. Admission weight 86.3 kg, currently 85.3 kg.  Unknown dry weight at this time. Per MD notes patient has BLE 1+ pitting edema today. Weight history reviewed. Patient with 9% weight loss within the past 3 months, which is most likely r/t volume status.  Patient recently moved from [place] to [place], now back in [place]. Plans for HD in hospital tomorrow, then discharge. She will receive outpatient HD at [facility] on TTS schedule.  Unable to speak with patient or complete NFPE. Unsure of oral intake PTA. Noted she is homeless, so food access may be a concern. Noted social work is assisting patient with finding housing/homeless shelter. Meal intakes documented at 85-100% since admission. Will add protein supplements to ensure adequate protein intake.   Labs reviewed.  Phos 7.2 (9/27) Corrected calcium 11.22  (9/27)  Medications reviewed and include calcitriol, Phoslo, IV antibiotics.  NUTRITION - FOCUSED PHYSICAL EXAM:  Unable to complete  Diet Order:   Diet Order             Diet renal with fluid restriction Fluid restriction: 1200 mL Fluid; Room service appropriate? Yes; Fluid consistency: Thin  Diet effective now                   EDUCATION NEEDS:   No education needs have been identified at this time  Skin:  Skin Assessment: Reviewed RN Assessment  Last BM:  no BM documented  Height:   Ht Readings from Last 1 Encounters:  08/25/22 5\' 8"  (1.727 m)    Weight:   Wt Readings from Last 1 Encounters:  08/27/22 85.3 kg    BMI:  Body mass index is 28.59 kg/m.  Estimated Nutritional Needs:   Kcal:  1900-2100  Protein:  100-120 gm  Fluid:  1 L + UOP   Lucas Mallow RD, LDN, CNSC Please refer to Amion for contact information.

## 2022-08-28 NOTE — Progress Notes (Signed)
[facility] Progress Note   Subjective: Seen in room.  Says she doesn't feel well today but can't pinpoint it.  VSS  Objective Vitals:   08/27/22 0024 08/27/22 0635 08/27/22 1943 08/28/22 0614  BP:  (!) 149/104 (!) 143/99 (!) 146/125  Pulse:  (!) 103 100   Resp:  20 20 19   Temp:  97.6 F (36.4 C) 97.9 F (36.6 C) 97.9 F (36.6 C)  TempSrc:  Oral Oral Oral  SpO2:  92% 90% 93%  Weight: 85.3 kg     Height:       Physical Exam General: NAD, sitting in bed Heart: E1,D4 RRR, systolic murmur throughout with some diastolic component Lungs: CTAB Abdomen:NABs, NT Extremities: Very trace LE edema Dialysis Access: L AVF + T/B    Additional Objective Labs: Basic Metabolic Panel: Recent Labs  Lab 08/24/22 1024 08/25/22 0416 08/26/22 0824  NA 139 138 138  K 4.7 4.0 3.9  CL 102 100 100  CO2 21* 24 24  GLUCOSE 130* 104* 106*  BUN 76* 49* 68*  CREATININE 11.66* 8.48* 10.02*  CALCIUM 9.6 9.8 10.1  PHOS 8.6* 6.5* 7.2*   Liver Function Tests: Recent Labs  Lab 08/24/22 1024 08/25/22 0416 08/26/22 0824  ALBUMIN 2.7* 2.5* 2.6*   No results for input(s): "LIPASE", "AMYLASE" in the last 168 hours. CBC: Recent Labs  Lab 08/23/22 2123 08/24/22 1024 08/25/22 0416  WBC 9.6 9.3 8.2  HGB 8.2* 7.8* 7.9*  HCT 26.7* 24.7* 26.4*  MCV 100.4* 99.2 100.8*  PLT 324 298 307   Blood Culture    Component Value Date/Time   SDES BLOOD RIGHT HAND 08/27/2022 1140   SPECREQUEST  08/27/2022 1140    BOTTLES DRAWN AEROBIC AND ANAEROBIC Blood Culture adequate volume   CULT  08/27/2022 1140    NO GROWTH < 24 HOURS Performed at [name], [place] [address], [place], [place] [address]    REPTSTATUS PENDING 08/27/2022 1140    Cardiac Enzymes: No results for input(s): "CKTOTAL", "CKMB", "CKMBINDEX", "TROPONINI" in the last 168 hours. CBG: No results for input(s): "GLUCAP" in the last 168 hours. Iron Studies: No results for input(s): "IRON", "TIBC", "TRANSFERRIN",  "FERRITIN" in the last 72 hours. @lablastinr3 @ Studies/Results: DG CHEST PORT 1 VIEW  Result Date: 08/28/2022 CLINICAL DATA:  Dyspnea, cough EXAM: PORTABLE CHEST 1 VIEW COMPARISON:  Previous studies including the examination of 08/23/2022 FINDINGS: Transverse diameter of heart is increased. Central pulmonary vessels are prominent. Small to moderate bilateral pleural effusions are seen, more so on the right side with interval increase. Evaluation of lower lung fields for infiltrates is limited by the effusions. There is no pneumothorax. IMPRESSION: Marked cardiomegaly. Possibility of pericardial effusion is not excluded. Small to moderate bilateral pleural effusions, more so on the right side with interval increase. Possibility of underlying atelectasis/pneumonia in the lower lung fields is not excluded. Electronically Signed   By: Elmer Picker M.D.   On: 08/28/2022 11:28   Medications:  cefTRIAXone (ROCEPHIN)  IV 2 g (08/27/22 1811)    calcitRIOL  0.25 mcg Oral Q T,Th,Sa-HD   calcium acetate  1,334 mg Oral TID WC   carvedilol  12.5 mg Oral BID WC   Chlorhexidine Gluconate Cloth  6 each Topical Q0600   doxycycline  100 mg Oral Q12H   heparin  5,000 Units Subcutaneous Q8H   hydrALAZINE  25 mg Oral Q8H   isosorbide mononitrate  30 mg Oral Daily   sodium chloride flush  3 mL Intravenous  Q12H   No HD center  Assessment/Plan: 1 Chest pain: trops flat, EKG unremarkable. TTE with severe MR--blood cultures NGTD. 2 ESRD:  ESRD MWF [facility].  Moved back to [place].  Notified renal navigator of need for CLIP. She has been CLIP'd to TTS at [facility] so next rx Saturday. Orders written for here 3 Hypertension:  has been out of meds x 3 weeks- put back on home meds and expect improvement with improvement in vol status. UF as tolerated.  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 Tsat is 53%. Start ESA with HD 08/26/2022 5. Metabolic Bone Disease:  PO4 slightly elevated, continue binders. Low dose VDRA. Check PTH  with HD tomorrow.  6.  Dispo: can d/c after dialysis Saturday here- can't start as NP in clinic on Sat    Madelon Lips MD 08/28/2022, 11:47 AM  [place] [phone]

## 2022-08-29 ENCOUNTER — Inpatient Hospital Stay (HOSPITAL_COMMUNITY): Payer: Medicaid Other

## 2022-08-29 DIAGNOSIS — I5023 Acute on chronic systolic (congestive) heart failure: Secondary | ICD-10-CM | POA: Diagnosis not present

## 2022-08-29 DIAGNOSIS — R079 Chest pain, unspecified: Secondary | ICD-10-CM | POA: Diagnosis not present

## 2022-08-29 DIAGNOSIS — N186 End stage renal disease: Secondary | ICD-10-CM | POA: Diagnosis not present

## 2022-08-29 DIAGNOSIS — D631 Anemia in chronic kidney disease: Secondary | ICD-10-CM | POA: Diagnosis not present

## 2022-08-29 MED ORDER — HEPARIN SODIUM (PORCINE) 1000 UNIT/ML IJ SOLN
INTRAMUSCULAR | Status: AC
  Filled 2022-08-29: qty 5

## 2022-08-29 MED ORDER — MUPIROCIN 2 % EX OINT: 1.0000 | TOPICAL_OINTMENT | Freq: Two times a day (BID) | CUTANEOUS | Status: DC

## 2022-08-29 NOTE — Progress Notes (Signed)
Natasha Long returned from HD via Transport per her request. She stated that she is being discharged, dialysis is going to take 3 hours, and she has somewhere to be at 24.  I explained the [place] that she does not have a discharge order as of yet, and completing dialysis is important before she leaves.  [name] went to the nurses station, fully clothed, and attempted to take out her own IV.  Film/video editor, intervened and helped her take out the IV properly.  I explained the risks of leaving without a physician order, include possibity of serious illness/death. [name] verbalized understanding. Signed AMA papers. Refused to wait for Dr. Lonny Prude to come by.   Dr. Lonny Prude was paged and he responded promptly, however, patient had already left unit at this time.

## 2022-08-29 NOTE — Discharge Summary (Signed)
Physician Discharge Summary   Patient: Natasha Long MRN: [id number] DOB: 10-19-1996  Admit date:     08/23/2022  Discharge date: 08/29/22  Discharge Physician: Cordelia Poche   PCP: [name], MD   Recommendations at discharge:  [place] ADVICE  Discharge Diagnoses: Principal Problem:   Nonspecific chest pain Active Problems:   Elevated troponin   ESRD on dialysis [facility])   Pericardial effusion without cardiac tamponade   Heart failure with reduced ejection fraction (HCC)   Abnormal chest x-ray   Anemia in chronic kidney disease   Mood disorder (HCC)   Polysubstance abuse ([place])   Left wrist pain   Acute on chronic systolic heart failure (HCC)   Pleural effusion   Mitral valve insufficiency  Resolved Problems:   * No resolved hospital problems. *  Hospital Course: Natasha Long is a 26 y.o. 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 Patient presented secondary to chest pain and shortness of breath with associated productive cough. On admission, patient was found to have evidence of fluid overload secondary to hemodialysis non-adherence. Chest x-ray suggests possible pneumonia and patient was started on empiric antibiotics. Nephrology and cardiology consulted and patient started hemodialysis for treatment.   Assessment and Plan:  Chest pain EKG without ischemic changes. Troponin of 399 > 326. Cardiology consulted. Transthoracic Echocardiogram significant for an LVEF of 45-50%. Presumed secondary to fluid overload. Cardiology with recommendations for continued volume management with HD.   Demand ischemia Per cardiology, likely secondary to fluid overload.   ESRD on HD Nephrology consulted and are performing intermittent hemodialysis on MWF   Chronic pericardial effusion Presumed secondary to fluid overload from missed  hemodialysis, although imaging suggests effusion is chronic from March 2023. Cardiology recommending continued hemodialysis and repeat limited Transthoracic Echocardiogram once more euvolemic. Patient left against medical advice prior to repeat Transthoracic Echocardiogram.   Acute on chronic HFrEF Fluid overload Secondary to missed hemodialysis. Management via hemodialysis.   Anemia of chronic kidney disease Stable.   Possible community acquired pneumonia Chest x-ray concerning for possible infiltrate vs atelectasis and patient has associated cough. Procalcitonin of 0.9. Patient started empirically on Ceftriaxone and doxycycline.   Uncontrolled hypertension Continue Coreg, hydralazine, Imdur   Polysubstance abuse Patient uses marijuana, cocaine and tobacco. Patient counseled.   Left wrist pain History of septic arthritis. X-ray imaging now shows possible degenerative arthritis. Supportive care initiated with topical analgesics and wrist brace.   High risk sexual activity Patient with concern for possible STI infection. High risk sexual practices with multiple partners and no condom use. History of syphilis. RPR and HIV non-reactive. Patient is on Ceftriaxone and doxycycline for concern of pneumonia. Patient declined wet prep.   Homelessness Complicates discharge. TOC consulted for resources.   Consultants: Nephrology, Cardiology Procedures performed: Hemodialysis, Transthoracic Echocardiogram  Disposition:  LEFT AGAINST MEDICAL ADVICE   DISCHARGE MEDICATION: Allergies as of 08/29/2022       Reactions   Prozac [fluoxetine Hcl] Anxiety, Other (See Comments)   Caused panic attacks   Wellbutrin [bupropion] Anxiety, Other (See Comments)   Caused panic attacks   Prednisone Other (See Comments)   Pt stated this med caused pancreatitis        Medication List     ASK your doctor about these medications    acetaminophen 500 MG tablet Commonly known as: TYLENOL Take 2  tablets (1,000 mg total) by mouth every 8 (eight) hours.  acyclovir ointment 5 % Commonly known as: ZOVIRAX Apply topically 4 (four) times daily.   ARIPiprazole 15 MG tablet Commonly known as: ABILIFY Take 0.5 tablets (7.5 mg total) by mouth daily.   calcitRIOL 0.25 MCG capsule Commonly known as: ROCALTROL Take 0.25 mcg by mouth Every Tuesday,Thursday,and Saturday with dialysis.   calcium acetate 667 MG capsule Commonly known as: PHOSLO Take 667 mg by mouth 3 (three) times daily with meals.   Darbepoetin Alfa 150 MCG/0.3ML Sosy injection Commonly known as: ARANESP Inject 0.3 mLs (150 mcg total) into the vein every Saturday with hemodialysis.   diclofenac Sodium 1 % Gel Commonly known as: VOLTAREN Apply 4 g topically 4 (four) times daily as needed (Pain to L wrist and R knee).   diphenhydrAMINE-zinc acetate cream Commonly known as: BENADRYL Apply 1 application. topically 2 (two) times daily as needed for itching.   gabapentin 600 MG tablet Commonly known as: NEURONTIN Take 300 mg by mouth 3 (three) times daily as needed (for neuropathy pain).   hydrocerin Crea Apply 1 application. topically 2 (two) times daily.   hydrOXYzine 25 MG capsule Commonly known as: VISTARIL Take 1 capsule (25 mg total) by mouth every 8 (eight) hours as needed for anxiety.   lidocaine-prilocaine cream Commonly known as: EMLA Apply 1 application topically daily as needed (port site access on dialysis days (Tuesday's, Thursday's, and Saturday's).   losartan 50 MG tablet Commonly known as: COZAAR Take 50 mg by mouth daily.   methocarbamol 500 MG tablet Commonly known as: ROBAXIN Take 2 tablets (1,000 mg total) by mouth at bedtime.   mineral oil-hydrophilic petrolatum ointment Apply 1 Application topically 3 (three) times daily as needed for dry skin or irritation.   omeprazole 20 MG capsule Commonly known as: PRILOSEC TAKE 1 CAPSULE BY MOUTH EVERY DAY   oxyCODONE 5 MG immediate release  tablet Commonly known as: Roxicodone Take 1 tablet (5 mg total) by mouth every 4 (four) hours as needed for severe pain.   polyethylene glycol 17 g packet Commonly known as: MIRALAX / GLYCOLAX Take 17 g by mouth 2 (two) times daily.   ramelteon 8 MG tablet Commonly known as: ROZEREM Take 1 tablet (8 mg total) by mouth at bedtime.   Renal Vitamin 0.8 MG Tabs Take 1 tablet by mouth daily.   senna-docusate 8.6-50 MG tablet Commonly known as: Senokot-S Take 2 tablets by mouth 2 (two) times daily.   sevelamer carbonate 800 MG tablet Commonly known as: RENVELA Take 2 tablets (1,600 mg total) by mouth 3 (three) times daily with meals.   torsemide 100 MG tablet Commonly known as: DEMADEX Take 100 mg by mouth every morning.   traZODone 100 MG tablet Commonly known as: DESYREL Take 100 mg by mouth at bedtime.        Follow-up Information     [name], MD Follow up.   Specialty: Pulmonary Disease Contact information: [address] [place] [address] [phone]         Belva Crome, MD .   Specialty: Cardiology Contact information: [phone] [place] [address] [place]. Go on 09/01/2022.   Why: Schedule is Tuesday/Thursday/Saturday with 12:40 chair time.  Please arrive at 12:00 for first appointment to complete paperwork prior to treatment. Contact information: [address] [place] [address] [phone]  Discharge Exam:  Patient left against medical advice  Condition at discharge: fair  The results of significant diagnostics from this hospitalization (including imaging, microbiology, ancillary and laboratory) are listed below for reference.   Imaging Studies: DG CHEST PORT 1 VIEW  Result Date: 08/28/2022 CLINICAL DATA:  Dyspnea, cough EXAM: PORTABLE CHEST 1 VIEW COMPARISON:  Previous studies including the examination of 08/23/2022  FINDINGS: Transverse diameter of heart is increased. Central pulmonary vessels are prominent. Small to moderate bilateral pleural effusions are seen, more so on the right side with interval increase. Evaluation of lower lung fields for infiltrates is limited by the effusions. There is no pneumothorax. IMPRESSION: Marked cardiomegaly. Possibility of pericardial effusion is not excluded. Small to moderate bilateral pleural effusions, more so on the right side with interval increase. Possibility of underlying atelectasis/pneumonia in the lower lung fields is not excluded. Electronically Signed   By: Elmer Picker M.D.   On: 08/28/2022 11:28   ECHOCARDIOGRAM COMPLETE  Result Date: 08/25/2022    ECHOCARDIOGRAM REPORT   Patient Name:   Natasha Long Date of Exam: 08/25/2022 Medical Rec #:  [id number]         Height:       68.0 in Accession #:    [id number]        Weight:       190.3 lb Date of Birth:  01-14-1996         BSA:          2.001 m Patient Age:    26 years          BP:           149/96 mmHg Patient Gender: F                 HR:           106 bpm. Exam Location:  Inpatient Procedure: 2D Echo, 3D Echo, Cardiac Doppler and Color Doppler Indications:    Chest pain  History:        Patient has prior history of Echocardiogram examinations.                 Signs/Symptoms:Chest Pain; Risk Factors:Hypertension. CKD.                 Polysubstance abuse.  Sonographer:    Clayton Lefort RDCS (AE) Referring Phys: [id number] RONDELL A SMITH IMPRESSIONS  1. Left ventricular ejection fraction, by estimation, is 45 to 50%. Left ventricular ejection fraction by 3D volume is 44 %. The left ventricle has mildly decreased function. The left ventricle demonstrates global hypokinesis. The left ventricular internal cavity size was mildly dilated. There is severe concentric left ventricular hypertrophy. Left ventricular diastolic parameters are indeterminate.  2. Right ventricular systolic function is normal. The right ventricular  size is moderately enlarged. There is severely elevated pulmonary artery systolic pressure.  3. Moderate pericardial effusion. The pericardial effusion is circumferential. Large pleural effusion in the left lateral region.  4. The mitral valve is degenerative. Severe mitral valve regurgitation.  5. The tricuspid valve is abnormal. 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  6. The aortic valve is abnormal. There is mild calcification of the aortic valve. Aortic valve regurgitation is not visualized. Aortic valve mean gradient measures 13.0 mmHg. Comparison(s): LVEF similar to prior. Pericardial effusion is similar to prior. Valve disease has worsened. Limited repeat study when euvolemic would be reasonable. FINDINGS  Left Ventricle: Left ventricular ejection fraction, by estimation, is 45 to 50%. Left ventricular ejection fraction by  3D volume is 44 %. The left ventricle has mildly decreased function. The left ventricle demonstrates global hypokinesis. The left ventricular internal cavity size was mildly dilated. There is severe concentric left ventricular hypertrophy. Left ventricular diastolic parameters are indeterminate. Right Ventricle: The right ventricular size is moderately enlarged. No increase in right ventricular wall thickness. Right ventricular systolic function is normal. There is severely elevated pulmonary artery systolic pressure. The tricuspid regurgitant velocity is 3.54 m/s, and with an assumed right atrial pressure of 15 mmHg, the estimated right ventricular systolic pressure is 85.0 mmHg. Left Atrium: Left atrial size was normal in size. Right Atrium: Right atrial size was normal in size. Pericardium: A moderately sized pericardial effusion is present. The pericardial effusion is circumferential. Mitral Valve: The mitral valve is degenerative in appearance. Severe mitral valve regurgitation. MV peak gradient, 15.8 mmHg. The mean mitral valve gradient is 7.0 mmHg with average heart rate of 105  bpm. Tricuspid Valve: The tricuspid valve is abnormal. 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 Aortic Valve: The aortic valve is abnormal. There is mild calcification of the aortic valve. Aortic valve regurgitation is not visualized. Aortic valve mean gradient measures 13.0 mmHg. Aortic valve peak gradient measures 24.4 mmHg. Aortic valve area, by  VTI measures 1.91 cm. Pulmonic Valve: The pulmonic valve was normal in structure. Pulmonic valve regurgitation is not visualized. No evidence of pulmonic stenosis. Aorta: The aortic root and ascending aorta are structurally normal, with no evidence of dilitation. IAS/Shunts: No atrial level shunt detected by color flow Doppler. Additional Comments: There is a large pleural effusion in the left lateral region.  LEFT VENTRICLE PLAX 2D LVIDd:         5.40 cm         Diastology LVIDs:         4.10 cm         LV e' medial:    6.53 cm/s LV PW:         1.80 cm         LV E/e' medial:  25.9 LV IVS:        1.90 cm         LV e' lateral:   9.03 cm/s LVOT diam:     2.10 cm         LV E/e' lateral: 18.7 LV SV:         70 LV SV Index:   35 LVOT Area:     3.46 cm        3D Volume EF                                LV 3D EF:    Left                                             ventricul LV Volumes (MOD)                            ar LV vol d, MOD    154.0 ml                   ejection A4C:  fraction LV vol s, MOD    82.1 ml                    by 3D A4C:                                        volume is LV SV MOD A4C:   154.0 ml                   44 %.                                 3D Volume EF:                                3D EF:        44 %                                LV EDV:       262 ml                                LV ESV:       146 ml                                LV SV:        115 ml RIGHT VENTRICLE             IVC RV Basal diam:  4.90 cm     IVC diam: 2.30 cm RV Mid diam:    4.10 cm RV S prime:     17.40 cm/s TAPSE (M-mode): 2.0 cm  LEFT ATRIUM             Index        RIGHT ATRIUM           Index LA diam:        3.50 cm 1.75 cm/m   RA Area:     20.00 cm LA Vol (A2C):   61.0 ml 30.49 ml/m  RA Volume:   56.80 ml  28.39 ml/m LA Vol (A4C):   58.1 ml 29.04 ml/m LA Biplane Vol: 62.1 ml 31.04 ml/m  AORTIC VALVE AV Area (Vmax):    1.92 cm AV Area (Vmean):   1.90 cm AV Area (VTI):     1.91 cm AV Vmax:           247.00 cm/s AV Vmean:          170.000 cm/s AV VTI:            0.368 m AV Peak Grad:      24.4 mmHg AV Mean Grad:      13.0 mmHg LVOT Vmax:         137.00 cm/s LVOT Vmean:        93.500 cm/s LVOT VTI:          0.203 m LVOT/AV VTI ratio: 0.55  AORTA Ao Root diam: 3.00 cm Ao Asc diam:  3.20 cm MITRAL VALVE  TRICUSPID VALVE MV Area (PHT): 6.48 cm     TR Peak grad:   50.1 mmHg MV Area VTI:   1.72 cm     TR Vmax:        354.00 cm/s MV Peak grad:  15.8 mmHg MV Mean grad:  7.0 mmHg     SHUNTS MV Vmax:       1.99 m/s     Systemic VTI:  0.20 m MV Vmean:      128.0 cm/s   Systemic Diam: 2.10 cm MV Decel Time: 117 msec MR Peak grad: 146.9 mmHg MR Mean grad: 89.0 mmHg MR Vmax:      606.00 cm/s MR Vmean:     435.0 cm/s MV E velocity: 169.00 cm/s MV A velocity: 137.00 cm/s MV E/A ratio:  1.23 Rudean Haskell MD Electronically signed by Rudean Haskell MD Signature Date/Time: 08/25/2022/5:44:51 PM    Final    DG Wrist 2 Views Left  Result Date: 08/24/2022 CLINICAL DATA:  Pain left wrist x2 weeks EXAM: LEFT WRIST - 2 VIEW COMPARISON:  None Available. FINDINGS: No recent displaced fracture or dislocation is seen. There is old healed fracture in the shaft of fifth metacarpal. Radiolucencies are seen in the lunate and scaphoid. There is narrowing of radiocarpal joint space. There are amorphous calcifications in the soft tissues along the medial and lateral aspect of the wrist and in the soft tissues between the first and second metacarpals. IMPRESSION: No recent displaced fracture or dislocation is seen. Radiolucencies are  seen in lunate and scaphoid along with joint space narrowing in the radiocarpal joint. Findings may be due to degenerative arthritis. Possibility of infectious process in the lunate and scaphoid is not excluded. Amorphous soft tissue calcifications are seen adjacent to the wrist and in the soft tissues between first and second metacarpals. This may suggest old soft tissue injury or residual changes from surgery. Electronically Signed   By: Elmer Picker M.D.   On: 08/24/2022 16:32   DG Chest 2 View  Result Date: 08/23/2022 CLINICAL DATA:  Chest pain EXAM: CHEST - 2 VIEW COMPARISON:  06/30/2022 FINDINGS: Lung volumes are small. Small bilateral pleural effusions are present. Right basilar peripheral opacity may represent a peripherally loculated pleural fluid component. Patchy atelectasis or infiltrate is seen at the left lung base. No pneumothorax. Mild cardiomegaly is stable. Perihilar interstitial pulmonary edema persists in keeping with mild cardiogenic failure. IMPRESSION: 1. Mild cardiogenic failure with small bilateral pleural effusions. 2. Left basilar atelectasis or infiltrate. 3. Peripheral right basilar opacity possibly representing a loculated pleural fluid component. Electronically Signed   By: Fidela Salisbury M.D.   On: 08/23/2022 21:46    Microbiology: Results for orders placed or performed during the hospital encounter of 08/23/22  MRSA Next Gen by PCR, Nasal     Status: Abnormal   Collection Time: 08/27/22 10:08 AM   Specimen: Nasal Mucosa; Nasal Swab  Result Value Ref Range Status   MRSA by PCR Next Gen DETECTED (A) NOT DETECTED Final    Comment: RESULT CALLED TO, READ BACK BY AND VERIFIED WITH: RN TRaymond Gurney [phone] @1644  FH        The GeneXpert MRSA Assay (FDA approved for NASAL specimens only), is one component of a comprehensive MRSA colonization surveillance program. It is not intended to diagnose MRSA infection nor to guide or monitor treatment for MRSA  infections. Performed at [facility], [place] [address] [place], [place] [address]   Culture, blood (Routine X 2)  w Reflex to ID Panel     Status: None (Preliminary result)   Collection Time: 08/27/22 11:33 AM   Specimen: BLOOD RIGHT HAND  Result Value Ref Range Status   Specimen Description BLOOD RIGHT HAND  Final   Special Requests   Final    BOTTLES DRAWN AEROBIC AND ANAEROBIC Blood Culture adequate volume   Culture   Final    NO GROWTH < 24 HOURS Performed at [facility], [address], [place], [place] [address]    Report Status PENDING  Incomplete  Culture, blood (Routine X 2) w Reflex to ID Panel     Status: None (Preliminary result)   Collection Time: 08/27/22 11:40 AM   Specimen: BLOOD RIGHT HAND  Result Value Ref Range Status   Specimen Description BLOOD RIGHT HAND  Final   Special Requests   Final    BOTTLES DRAWN AEROBIC AND ANAEROBIC Blood Culture adequate volume   Culture   Final    NO GROWTH < 24 HOURS Performed at [facility], [place] [address], [place], [place] [address]    Report Status PENDING  Incomplete    Labs: CBC: Recent Labs  Lab 08/23/22 2123 08/24/22 1024 08/25/22 0416  WBC 9.6 9.3 8.2  HGB 8.2* 7.8* 7.9*  HCT 26.7* 24.7* 26.4*  MCV 100.4* 99.2 100.8*  PLT 324 298 545   Basic Metabolic Panel: Recent Labs  Lab 08/23/22 2123 08/24/22 1024 08/25/22 0416 08/26/22 0824  NA 141 139 138 138  K 5.0 4.7 4.0 3.9  CL 102 102 100 100  CO2 22 21* 24 24  GLUCOSE 96 130* 104* 106*  BUN 70* 76* 49* 68*  CREATININE 10.99* 11.66* 8.48* 10.02*  CALCIUM 10.2 9.6 9.8 10.1  PHOS  --  8.6* 6.5* 7.2*   Liver Function Tests: Recent Labs  Lab 08/24/22 1024 08/25/22 0416 08/26/22 0824  ALBUMIN 2.7* 2.5* 2.6*     Signed: Cordelia Poche, MD [facility] 08/29/2022

## 2022-08-29 NOTE — Progress Notes (Signed)
Brought up for dialysis - when got to unit, she is refusing to start treatment. Says that she has somewhere to be at noon - it's "my business." Nurse Donnal Debar and I tried to negotiate with her for 10 minutes, offered partial treatment. Discusses risks of missed HD. She continues to refuse. Will send her back to her room without dialysis and inform her hospitalist.  [name], PA-C [facility] Pager [phone]

## 2022-08-30 ENCOUNTER — Inpatient Hospital Stay (HOSPITAL_COMMUNITY)
Admission: EM | Admit: 2022-08-30 | Discharge: 2022-09-02 | DRG: 682 | Disposition: A | Discharge status: Home / Self Care | Payer: Medicaid Other | Attending: Internal Medicine | Admitting: Internal Medicine

## 2022-08-30 ENCOUNTER — Encounter (HOSPITAL_COMMUNITY): Payer: Self-pay | Admitting: Emergency Medicine

## 2022-08-30 ENCOUNTER — Other Ambulatory Visit: Payer: Self-pay

## 2022-08-30 ENCOUNTER — Emergency Department (HOSPITAL_COMMUNITY): Payer: Medicaid Other

## 2022-08-30 ENCOUNTER — Telehealth (HOSPITAL_COMMUNITY): Payer: Self-pay | Admitting: Nephrology

## 2022-08-30 DIAGNOSIS — E8770 Fluid overload, unspecified: Secondary | ICD-10-CM | POA: Diagnosis present

## 2022-08-30 DIAGNOSIS — F1410 Cocaine abuse, uncomplicated: Secondary | ICD-10-CM | POA: Diagnosis present

## 2022-08-30 DIAGNOSIS — Z992 Dependence on renal dialysis: Secondary | ICD-10-CM

## 2022-08-30 DIAGNOSIS — Z9151 Personal history of suicidal behavior: Secondary | ICD-10-CM

## 2022-08-30 DIAGNOSIS — R109 Unspecified abdominal pain: Secondary | ICD-10-CM

## 2022-08-30 DIAGNOSIS — M545 Low back pain, unspecified: Secondary | ICD-10-CM | POA: Diagnosis present

## 2022-08-30 DIAGNOSIS — R7989 Other specified abnormal findings of blood chemistry: Principal | ICD-10-CM

## 2022-08-30 DIAGNOSIS — F603 Borderline personality disorder: Secondary | ICD-10-CM | POA: Diagnosis present

## 2022-08-30 DIAGNOSIS — G4700 Insomnia, unspecified: Secondary | ICD-10-CM | POA: Diagnosis present

## 2022-08-30 DIAGNOSIS — I120 Hypertensive chronic kidney disease with stage 5 chronic kidney disease or end stage renal disease: Principal | ICD-10-CM | POA: Diagnosis present

## 2022-08-30 DIAGNOSIS — Z91199 Patient's noncompliance with other medical treatment and regimen due to unspecified reason: Secondary | ICD-10-CM

## 2022-08-30 DIAGNOSIS — Z888 Allergy status to other drugs, medicaments and biological substances status: Secondary | ICD-10-CM

## 2022-08-30 DIAGNOSIS — G629 Polyneuropathy, unspecified: Secondary | ICD-10-CM | POA: Diagnosis present

## 2022-08-30 DIAGNOSIS — G8929 Other chronic pain: Secondary | ICD-10-CM | POA: Diagnosis present

## 2022-08-30 DIAGNOSIS — Z833 Family history of diabetes mellitus: Secondary | ICD-10-CM

## 2022-08-30 DIAGNOSIS — M549 Dorsalgia, unspecified: Secondary | ICD-10-CM | POA: Diagnosis present

## 2022-08-30 DIAGNOSIS — N186 End stage renal disease: Secondary | ICD-10-CM | POA: Diagnosis present

## 2022-08-30 DIAGNOSIS — F1210 Cannabis abuse, uncomplicated: Secondary | ICD-10-CM | POA: Diagnosis present

## 2022-08-30 DIAGNOSIS — Z720 Tobacco use: Secondary | ICD-10-CM | POA: Diagnosis present

## 2022-08-30 DIAGNOSIS — R0789 Other chest pain: Secondary | ICD-10-CM | POA: Diagnosis present

## 2022-08-30 DIAGNOSIS — I3139 Other pericardial effusion (noninflammatory): Secondary | ICD-10-CM | POA: Diagnosis present

## 2022-08-30 DIAGNOSIS — A419 Sepsis, unspecified organism: Secondary | ICD-10-CM | POA: Diagnosis present

## 2022-08-30 DIAGNOSIS — Z8249 Family history of ischemic heart disease and other diseases of the circulatory system: Secondary | ICD-10-CM

## 2022-08-30 DIAGNOSIS — F17210 Nicotine dependence, cigarettes, uncomplicated: Secondary | ICD-10-CM | POA: Diagnosis present

## 2022-08-30 DIAGNOSIS — F4310 Post-traumatic stress disorder, unspecified: Secondary | ICD-10-CM | POA: Diagnosis present

## 2022-08-30 DIAGNOSIS — D631 Anemia in chronic kidney disease: Secondary | ICD-10-CM | POA: Diagnosis present

## 2022-08-30 DIAGNOSIS — F1910 Other psychoactive substance abuse, uncomplicated: Secondary | ICD-10-CM | POA: Diagnosis present

## 2022-08-30 DIAGNOSIS — Z5900 Homelessness unspecified: Secondary | ICD-10-CM

## 2022-08-30 NOTE — ED Triage Notes (Signed)
[place] ( [place]) , missed her hemodialysis treatment yesterday , reports central chest pain and low back pain for 1 week , respirations unlabored . She received ASA 324 mg prior to arrival by EMS .

## 2022-08-30 NOTE — ED Provider Triage Note (Signed)
Emergency Medicine Provider Triage Evaluation Note  Natasha Long , a 26 y.o. female  was evaluated in triage.  Pt complains of chest pain and low back pain.  Patient left this hospital yesterday [place] after being admitted for nonspecific chest pain.  Patient has been found to have fluid overload secondary to hemodialysis nonadherence.  Chest x-ray at that time showed possible pneumonia and empiric antibiotics have been started.  Patient was started on hemodialysis after nephrology and cardiology were consulted.  Patient states she has history of multiple stays facilities and had lots of anxiety while being in the hospital which caused her to leave [place].  She states she is willing to stay at this time if she is readmitted.  The chest pain she states is worse than before.  It is substernal in nature.  She also endorses shortness of breath and bilateral flank pain.  Patient denies nausea, vomiting  Review of Systems  Positive: As above Negative: As above  Physical Exam  BP (!) 166/112   Pulse 99   Temp 99.1 F (37.3 C) (Oral)   Resp 16   SpO2 100%  Gen:   Awake, no distress   Resp:  Normal effort  MSK:   Moves extremities without difficulty  Other:    Medical Decision Making  Medically screening exam initiated at 11:36 PM.  Appropriate orders placed.  Natasha Long was informed that the remainder of the evaluation will be completed by another provider, this initial triage assessment does not replace that evaluation, and the importance of remaining in the ED until their evaluation is complete.     Natasha Long 08/30/22 2338

## 2022-08-30 NOTE — Telephone Encounter (Signed)
Transition of care contact from inpatient facility  Date of Discharge: 08/29/22 Date of Contact: 08/30/22 - attempt #1 Method of contact: Phone  Attempted to contact patient to discuss transition of care from inpatient admission. Patient did not answer the phone. There was no ability to leave a message. She left abruptly so I hope that she will go to dialysis this week. I will let the renal navigator know to attempt to call her on Monday.  [name], PA-C [employer] Pager [phone]

## 2022-08-31 ENCOUNTER — Inpatient Hospital Stay (HOSPITAL_COMMUNITY): Payer: Medicaid Other

## 2022-08-31 ENCOUNTER — Encounter (HOSPITAL_COMMUNITY): Payer: Self-pay | Admitting: Emergency Medicine

## 2022-08-31 ENCOUNTER — Telehealth: Payer: Self-pay

## 2022-08-31 DIAGNOSIS — G8929 Other chronic pain: Secondary | ICD-10-CM | POA: Diagnosis present

## 2022-08-31 DIAGNOSIS — E8770 Fluid overload, unspecified: Secondary | ICD-10-CM

## 2022-08-31 DIAGNOSIS — Z992 Dependence on renal dialysis: Secondary | ICD-10-CM | POA: Diagnosis not present

## 2022-08-31 DIAGNOSIS — N289 Disorder of kidney and ureter, unspecified: Secondary | ICD-10-CM | POA: Diagnosis not present

## 2022-08-31 DIAGNOSIS — Z9151 Personal history of suicidal behavior: Secondary | ICD-10-CM | POA: Diagnosis not present

## 2022-08-31 DIAGNOSIS — I3139 Other pericardial effusion (noninflammatory): Secondary | ICD-10-CM | POA: Diagnosis present

## 2022-08-31 DIAGNOSIS — R7989 Other specified abnormal findings of blood chemistry: Secondary | ICD-10-CM | POA: Diagnosis not present

## 2022-08-31 DIAGNOSIS — Z888 Allergy status to other drugs, medicaments and biological substances status: Secondary | ICD-10-CM | POA: Diagnosis not present

## 2022-08-31 DIAGNOSIS — I120 Hypertensive chronic kidney disease with stage 5 chronic kidney disease or end stage renal disease: Secondary | ICD-10-CM | POA: Diagnosis present

## 2022-08-31 DIAGNOSIS — M549 Dorsalgia, unspecified: Secondary | ICD-10-CM | POA: Diagnosis present

## 2022-08-31 DIAGNOSIS — R0789 Other chest pain: Secondary | ICD-10-CM | POA: Diagnosis present

## 2022-08-31 DIAGNOSIS — D631 Anemia in chronic kidney disease: Secondary | ICD-10-CM | POA: Diagnosis present

## 2022-08-31 DIAGNOSIS — F603 Borderline personality disorder: Secondary | ICD-10-CM | POA: Diagnosis present

## 2022-08-31 DIAGNOSIS — F17210 Nicotine dependence, cigarettes, uncomplicated: Secondary | ICD-10-CM | POA: Diagnosis present

## 2022-08-31 DIAGNOSIS — F1410 Cocaine abuse, uncomplicated: Secondary | ICD-10-CM | POA: Diagnosis present

## 2022-08-31 DIAGNOSIS — M5440 Lumbago with sciatica, unspecified side: Secondary | ICD-10-CM | POA: Diagnosis not present

## 2022-08-31 DIAGNOSIS — A419 Sepsis, unspecified organism: Secondary | ICD-10-CM | POA: Diagnosis present

## 2022-08-31 DIAGNOSIS — Z5900 Homelessness unspecified: Secondary | ICD-10-CM

## 2022-08-31 DIAGNOSIS — G4700 Insomnia, unspecified: Secondary | ICD-10-CM | POA: Diagnosis present

## 2022-08-31 DIAGNOSIS — N186 End stage renal disease: Secondary | ICD-10-CM | POA: Diagnosis present

## 2022-08-31 DIAGNOSIS — F4310 Post-traumatic stress disorder, unspecified: Secondary | ICD-10-CM | POA: Diagnosis present

## 2022-08-31 DIAGNOSIS — Z833 Family history of diabetes mellitus: Secondary | ICD-10-CM | POA: Diagnosis not present

## 2022-08-31 DIAGNOSIS — M545 Low back pain, unspecified: Secondary | ICD-10-CM | POA: Diagnosis present

## 2022-08-31 DIAGNOSIS — Z91199 Patient's noncompliance with other medical treatment and regimen due to unspecified reason: Secondary | ICD-10-CM | POA: Diagnosis not present

## 2022-08-31 DIAGNOSIS — F1210 Cannabis abuse, uncomplicated: Secondary | ICD-10-CM | POA: Diagnosis present

## 2022-08-31 DIAGNOSIS — R109 Unspecified abdominal pain: Secondary | ICD-10-CM | POA: Diagnosis present

## 2022-08-31 DIAGNOSIS — G629 Polyneuropathy, unspecified: Secondary | ICD-10-CM | POA: Diagnosis present

## 2022-08-31 DIAGNOSIS — Z8249 Family history of ischemic heart disease and other diseases of the circulatory system: Secondary | ICD-10-CM | POA: Diagnosis not present

## 2022-08-31 LAB — CBC WITH DIFFERENTIAL/PLATELET
Abs Immature Granulocytes: 0.1 10*3/uL — ABNORMAL HIGH (ref 0.00–0.07)
Basophils Absolute: 0.1 10*3/uL (ref 0.0–0.1)
Basophils Relative: 0 %
Eosinophils Absolute: 0.2 10*3/uL (ref 0.0–0.5)
Eosinophils Relative: 1 %
HCT: 27.3 % — ABNORMAL LOW (ref 36.0–46.0)
Hemoglobin: 8.6 g/dL — ABNORMAL LOW (ref 12.0–15.0)
Immature Granulocytes: 1 %
Lymphocytes Relative: 11 %
Lymphs Abs: 1.5 10*3/uL (ref 0.7–4.0)
MCH: 31.2 pg (ref 26.0–34.0)
MCHC: 31.5 g/dL (ref 30.0–36.0)
MCV: 98.9 fL (ref 80.0–100.0)
Monocytes Absolute: 0.6 10*3/uL (ref 0.1–1.0)
Monocytes Relative: 4 %
Neutro Abs: 11.5 10*3/uL — ABNORMAL HIGH (ref 1.7–7.7)
Neutrophils Relative %: 83 %
Platelets: 311 10*3/uL (ref 150–400)
RBC: 2.76 MIL/uL — ABNORMAL LOW (ref 3.87–5.11)
RDW: 16.7 % — ABNORMAL HIGH (ref 11.5–15.5)
WBC: 13.9 10*3/uL — ABNORMAL HIGH (ref 4.0–10.5)
nRBC: 0 % (ref 0.0–0.2)

## 2022-08-31 LAB — COMPREHENSIVE METABOLIC PANEL
ALT: 28 U/L (ref 0–44)
AST: 24 U/L (ref 15–41)
Albumin: 2.8 g/dL — ABNORMAL LOW (ref 3.5–5.0)
Alkaline Phosphatase: 64 U/L (ref 38–126)
Anion gap: 19 — ABNORMAL HIGH (ref 5–15)
BUN: 106 mg/dL — ABNORMAL HIGH (ref 6–20)
CO2: 20 mmol/L — ABNORMAL LOW (ref 22–32)
Calcium: 9.4 mg/dL (ref 8.9–10.3)
Chloride: 101 mmol/L (ref 98–111)
Creatinine, Ser: 12.92 mg/dL — ABNORMAL HIGH (ref 0.44–1.00)
GFR, Estimated: 4 mL/min — ABNORMAL LOW (ref >60)
Glucose, Bld: 122 mg/dL — ABNORMAL HIGH (ref 70–99)
Potassium: 4.1 mmol/L (ref 3.5–5.1)
Sodium: 140 mmol/L (ref 135–145)
Total Bilirubin: 0.6 mg/dL (ref 0.3–1.2)
Total Protein: 6.9 g/dL (ref 6.5–8.1)

## 2022-08-31 LAB — BRAIN NATRIURETIC PEPTIDE: B Natriuretic Peptide: 1902.1 pg/mL — ABNORMAL HIGH (ref 0.0–100.0)

## 2022-08-31 LAB — TROPONIN I (HIGH SENSITIVITY)
Troponin I (High Sensitivity): 142 ng/L (ref <18)
Troponin I (High Sensitivity): 158 ng/L (ref <18)

## 2022-08-31 LAB — I-STAT BETA HCG BLOOD, ED (MC, WL, AP ONLY): I-stat hCG, quantitative: <5 m[IU]/mL (ref <5)

## 2022-08-31 MED ORDER — ARIPIPRAZOLE 5 MG PO TABS
7.5000 mg | ORAL_TABLET | Freq: Every day | ORAL | Status: DC
  Administered 2022-08-31 – 2022-09-02 (×3): 7.5 mg via ORAL
  Filled 2022-08-31 (×4): qty 2

## 2022-08-31 MED ORDER — CLONIDINE HCL 0.1 MG PO TABS
0.1000 mg | ORAL_TABLET | Freq: Four times a day (QID) | ORAL | Status: AC
  Administered 2022-08-31 – 2022-09-01 (×5): 0.1 mg via ORAL
  Filled 2022-08-31 (×5): qty 1

## 2022-08-31 MED ORDER — CLONIDINE HCL 0.1 MG PO TABS: 0.1000 mg | ORAL_TABLET | Freq: Every day | ORAL | Status: DC

## 2022-08-31 MED ORDER — HEPARIN SODIUM (PORCINE) 5000 UNIT/ML IJ SOLN
5000.0000 [IU] | Freq: Three times a day (TID) | INTRAMUSCULAR | Status: DC
  Administered 2022-08-31 – 2022-09-01 (×2): 5000 [IU] via SUBCUTANEOUS
  Filled 2022-08-31 (×5): qty 1

## 2022-08-31 MED ORDER — GABAPENTIN 300 MG PO CAPS: 300.0000 mg | ORAL_CAPSULE | Freq: Three times a day (TID) | ORAL | Status: DC | PRN

## 2022-08-31 MED ORDER — HEPARIN SODIUM (PORCINE) 1000 UNIT/ML IJ SOLN
INTRAMUSCULAR | Status: AC
  Administered 2022-08-31: 2500 [IU]
  Filled 2022-08-31: qty 3

## 2022-08-31 MED ORDER — VANCOMYCIN HCL IN DEXTROSE 1-5 GM/200ML-% IV SOLN: 1000.0000 mg | Freq: Once | INTRAVENOUS | Status: DC

## 2022-08-31 MED ORDER — CHLORHEXIDINE GLUCONATE CLOTH 2 % EX PADS: 6.0000 | MEDICATED_PAD | Freq: Every day | CUTANEOUS | Status: DC

## 2022-08-31 MED ORDER — RENAL VITAMIN 0.8 MG PO TABS: 1.0000 | ORAL_TABLET | Freq: Every day | ORAL | Status: DC

## 2022-08-31 MED ORDER — VANCOMYCIN HCL 2000 MG/400ML IV SOLN
2000.0000 mg | Freq: Once | INTRAVENOUS | Status: AC
  Administered 2022-08-31: 2000 mg via INTRAVENOUS
  Filled 2022-08-31: qty 400

## 2022-08-31 MED ORDER — KETOROLAC TROMETHAMINE 30 MG/ML IJ SOLN: 15.0000 mg | Freq: Once | INTRAMUSCULAR | Status: DC

## 2022-08-31 MED ORDER — HYDRALAZINE HCL 20 MG/ML IJ SOLN: 5.0000 mg | INTRAMUSCULAR | Status: DC | PRN

## 2022-08-31 MED ORDER — VANCOMYCIN VARIABLE DOSE PER UNSTABLE RENAL FUNCTION (PHARMACIST DOSING): Status: DC

## 2022-08-31 MED ORDER — ALBUTEROL SULFATE (2.5 MG/3ML) 0.083% IN NEBU: 2.5000 mg | INHALATION_SOLUTION | RESPIRATORY_TRACT | Status: DC | PRN

## 2022-08-31 MED ORDER — TORSEMIDE 20 MG PO TABS
100.0000 mg | ORAL_TABLET | Freq: Every morning | ORAL | Status: DC
  Administered 2022-08-31 – 2022-09-02 (×2): 100 mg via ORAL
  Filled 2022-08-31 (×2): qty 5

## 2022-08-31 MED ORDER — CALCIUM ACETATE (PHOS BINDER) 667 MG PO CAPS
667.0000 mg | ORAL_CAPSULE | Freq: Three times a day (TID) | ORAL | Status: DC
  Administered 2022-08-31 – 2022-09-02 (×5): 667 mg via ORAL
  Filled 2022-08-31 (×5): qty 1

## 2022-08-31 MED ORDER — HYDROXYZINE HCL 10 MG PO TABS
25.0000 mg | ORAL_TABLET | Freq: Three times a day (TID) | ORAL | Status: DC | PRN
  Administered 2022-09-01: 25 mg via ORAL
  Filled 2022-08-31: qty 3

## 2022-08-31 MED ORDER — CLONIDINE HCL 0.1 MG PO TABS
0.1000 mg | ORAL_TABLET | ORAL | Status: DC
  Administered 2022-09-02: 0.1 mg via ORAL
  Filled 2022-08-31: qty 1

## 2022-08-31 MED ORDER — DICYCLOMINE HCL 20 MG PO TABS: 20.0000 mg | ORAL_TABLET | Freq: Four times a day (QID) | ORAL | Status: DC | PRN

## 2022-08-31 MED ORDER — NICOTINE 14 MG/24HR TD PT24: 14.0000 mg | MEDICATED_PATCH | Freq: Every day | TRANSDERMAL | Status: DC | PRN

## 2022-08-31 MED ORDER — LIDOCAINE-PRILOCAINE 2.5-2.5 % EX CREA: 1.0000 | TOPICAL_CREAM | Freq: Every day | CUTANEOUS | Status: DC | PRN

## 2022-08-31 MED ORDER — DOCUSATE SODIUM 283 MG RE ENEM
1.0000 | ENEMA | RECTAL | Status: DC | PRN
  Filled 2022-08-31: qty 1

## 2022-08-31 MED ORDER — NITROGLYCERIN 2 % TD OINT
0.5000 [in_us] | TOPICAL_OINTMENT | Freq: Once | TRANSDERMAL | Status: AC
  Administered 2022-08-31: 0.5 [in_us] via TOPICAL
  Filled 2022-08-31: qty 1

## 2022-08-31 MED ORDER — ACETAMINOPHEN 500 MG PO TABS
1000.0000 mg | ORAL_TABLET | Freq: Once | ORAL | Status: AC
  Administered 2022-08-31: 1000 mg via ORAL
  Filled 2022-08-31: qty 2

## 2022-08-31 MED ORDER — CALCITRIOL 0.25 MCG PO CAPS
0.2500 ug | ORAL_CAPSULE | ORAL | Status: DC
  Administered 2022-09-01: 0.25 ug via ORAL
  Filled 2022-08-31: qty 1

## 2022-08-31 MED ORDER — METHOCARBAMOL 500 MG PO TABS: 500.0000 mg | ORAL_TABLET | Freq: Three times a day (TID) | ORAL | Status: DC | PRN

## 2022-08-31 MED ORDER — SEVELAMER CARBONATE 800 MG PO TABS
1600.0000 mg | ORAL_TABLET | Freq: Three times a day (TID) | ORAL | Status: DC
  Administered 2022-08-31 – 2022-09-02 (×3): 1600 mg via ORAL
  Filled 2022-08-31 (×4): qty 2

## 2022-08-31 MED ORDER — ONDANSETRON HCL 4 MG/2ML IJ SOLN
4.0000 mg | Freq: Four times a day (QID) | INTRAMUSCULAR | Status: DC | PRN
  Administered 2022-09-01: 4 mg via INTRAVENOUS
  Filled 2022-08-31: qty 2

## 2022-08-31 MED ORDER — ZOLPIDEM TARTRATE 5 MG PO TABS: 5.0000 mg | ORAL_TABLET | Freq: Every evening | ORAL | Status: DC | PRN

## 2022-08-31 MED ORDER — CALCIUM CARBONATE ANTACID 1250 MG/5ML PO SUSP
500.0000 mg | Freq: Four times a day (QID) | ORAL | Status: DC | PRN
  Filled 2022-08-31: qty 5

## 2022-08-31 MED ORDER — RENA-VITE PO TABS
1.0000 | ORAL_TABLET | Freq: Every day | ORAL | Status: DC
  Administered 2022-09-02: 1 via ORAL
  Filled 2022-08-31 (×2): qty 1

## 2022-08-31 MED ORDER — LOPERAMIDE HCL 2 MG PO CAPS: 2.0000 mg | ORAL_CAPSULE | ORAL | Status: DC | PRN

## 2022-08-31 MED ORDER — SORBITOL 70 % SOLN
30.0000 mL | Status: DC | PRN
  Filled 2022-08-31: qty 30

## 2022-08-31 MED ORDER — LOSARTAN POTASSIUM 50 MG PO TABS
50.0000 mg | ORAL_TABLET | Freq: Every day | ORAL | Status: DC
  Administered 2022-08-31 – 2022-09-02 (×2): 50 mg via ORAL
  Filled 2022-08-31 (×2): qty 1

## 2022-08-31 MED ORDER — HEPARIN SODIUM (PORCINE) 1000 UNIT/ML DIALYSIS
2500.0000 [IU] | INTRAMUSCULAR | Status: DC | PRN
  Filled 2022-08-31 (×2): qty 3

## 2022-08-31 MED ORDER — SODIUM CHLORIDE 0.9% FLUSH
3.0000 mL | Freq: Two times a day (BID) | INTRAVENOUS | Status: DC
  Administered 2022-08-31 – 2022-09-02 (×3): 3 mL via INTRAVENOUS

## 2022-08-31 MED ORDER — TRAZODONE HCL 50 MG PO TABS
100.0000 mg | ORAL_TABLET | Freq: Every day | ORAL | Status: DC
  Administered 2022-08-31: 100 mg via ORAL
  Filled 2022-08-31 (×2): qty 2

## 2022-08-31 MED ORDER — SODIUM CHLORIDE 0.9 % IV SOLN
2.0000 g | Freq: Once | INTRAVENOUS | Status: AC
  Administered 2022-08-31: 2 g via INTRAVENOUS
  Filled 2022-08-31: qty 12.5

## 2022-08-31 MED ORDER — ONDANSETRON HCL 4 MG PO TABS: 4.0000 mg | ORAL_TABLET | Freq: Four times a day (QID) | ORAL | Status: DC | PRN

## 2022-08-31 MED ORDER — ACETAMINOPHEN 325 MG PO TABS: 650.0000 mg | ORAL_TABLET | Freq: Four times a day (QID) | ORAL | Status: DC | PRN

## 2022-08-31 MED ORDER — NEPRO/CARBSTEADY PO LIQD
237.0000 mL | Freq: Three times a day (TID) | ORAL | Status: DC | PRN
  Filled 2022-08-31: qty 237

## 2022-08-31 MED ORDER — ACETAMINOPHEN 650 MG RE SUPP: 650.0000 mg | Freq: Four times a day (QID) | RECTAL | Status: DC | PRN

## 2022-08-31 NOTE — Progress Notes (Signed)
Pt known to navigator from past admissions. Last week, pt was accepted back at [facility]) on TTS 12:40 chair time. Pt was supposed to start at clinic tomorrow. Pt was to arrive at 12:00 to complete paperwork prior to treatment. Pt was advised of this info last week but navigator will attempt to review again with pt prior to d/c. Will add to pt's AVS and assist as needed.   Melven Sartorius Renal Navigator [phone]

## 2022-08-31 NOTE — Progress Notes (Signed)
Patient refused to get out/up from bed to have her pre HD standing weight.ED stretcher has no weight scale.

## 2022-08-31 NOTE — Procedures (Signed)
   I was present at this dialysis session, have reviewed the session itself and made  appropriate changes Kelly Splinter MD [place] pager [phone]   08/31/2022, 4:45 PM

## 2022-08-31 NOTE — Telephone Encounter (Signed)
Transition Care Management Follow-up Telephone Call Date of discharge and from where: 08/29/2022, [facility]. She returned to ED on 08/30/2022 and is still there.

## 2022-08-31 NOTE — ED Notes (Signed)
RN gave pt ginger ale and sandwich bag.
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
## 2022-08-31 NOTE — Progress Notes (Signed)
Unable to get weight,Refused to get up/out from stretcher bed to get post HD weight.Patient still in ED stretcher bed which has no weight scale.Requested a floor nurse to get weight when she would be transferring into hospital bed.

## 2022-08-31 NOTE — ED Provider Notes (Addendum)
[facility] EMERGENCY DEPARTMENT Provider Note   CSN: [id number] Arrival date & time: 08/30/22  2315     History  Chief Complaint  Patient presents with   Chest Pain    Missed Dialysis Yesterday    Natasha Long is a 26 y.o. female.  The history is provided by the patient.  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 Patient is also experiencing worsening BLE edema having missed dialysis on Saturday.       Home Medications Prior to Admission medications   Medication Sig Start Date End Date Taking? Authorizing Provider  acetaminophen (TYLENOL) 500 MG tablet Take 2 tablets (1,000 mg total) by mouth every 8 (eight) hours. Patient not taking: Reported on 08/24/2022 02/16/22   [name], MD  acyclovir ointment (ZOVIRAX) 5 % Apply topically 4 (four) times daily. Patient not taking: Reported on 08/24/2022 05/07/22   Dwyane Dee, MD  ARIPiprazole (ABILIFY) 15 MG tablet Take 0.5 tablets (7.5 mg total) by mouth daily. Patient not taking: Reported on 08/24/2022 12/27/21   Regalado, Jerald Kief A, MD  B Complex-C-Folic Acid (RENAL VITAMIN) 0.8 MG TABS Take 1 tablet by mouth daily. Patient not taking: Reported on 08/24/2022 10/29/21   [provider]  calcitRIOL (ROCALTROL) 0.25 MCG capsule Take 0.25  mcg by mouth Every Tuesday,Thursday,and Saturday with dialysis. Patient not taking: Reported on 08/24/2022 09/25/20   [provider]  calcium acetate (PHOSLO) 667 MG capsule Take 667 mg by mouth 3 (three) times daily with meals. Patient not taking: Reported on 08/24/2022 07/03/21   [provider]  Darbepoetin Alfa (ARANESP) 150 MCG/0.3ML SOSY injection Inject 0.3 mLs (150 mcg total) into the vein every Saturday with hemodialysis. Patient not taking: Reported on 08/24/2022 02/21/22   [name], MD  diclofenac Sodium (VOLTAREN) 1 % GEL Apply 4 g topically 4 (four) times daily as needed (Pain to L wrist and R knee). Patient not taking: Reported on 08/24/2022 02/16/22   [name], MD  diphenhydrAMINE-zinc acetate (BENADRYL) cream Apply 1 application. topically 2 (two) times daily as needed for itching. Patient not taking: Reported on 08/24/2022 02/16/22   [name], MD  gabapentin (NEURONTIN) 600 MG tablet Take 300 mg by mouth 3 (three) times daily as needed (for neuropathy pain). Patient not taking: Reported on 08/24/2022 03/31/22   [provider]  hydrocerin (EUCERIN) CREA Apply 1 application. topically 2 (two) times daily. Patient not taking: Reported on 08/24/2022 02/16/22   [name], MD  hydrOXYzine (VISTARIL) 25 MG capsule Take 1 capsule (25 mg total) by mouth every 8 (eight) hours as needed for anxiety. Patient not taking: Reported on 08/24/2022 03/25/22   [name], MD  lidocaine-prilocaine (EMLA) cream Apply 1 application topically daily as needed (port site access on dialysis days (Tuesday's, Thursday's, and Saturday's). Patient not taking: Reported on  08/24/2022 03/06/21   [provider]  losartan (COZAAR) 50 MG tablet Take 50 mg by mouth daily. Patient not taking: Reported on 08/24/2022 12/11/21   [provider]  methocarbamol (ROBAXIN) 500 MG tablet Take 2 tablets (1,000 mg total) by mouth at bedtime. Patient not taking:  Reported on 05/15/2022 02/16/22   [name], MD  mineral oil-hydrophilic petrolatum (AQUAPHOR) ointment Apply 1 Application topically 3 (three) times daily as needed for dry skin or irritation. Patient not taking: Reported on 08/24/2022 05/17/22   [provider]  omeprazole (PRILOSEC) 20 MG capsule TAKE 1 CAPSULE BY MOUTH EVERY DAY Patient not taking: Reported on 08/24/2022 04/01/22   [name], MD  oxyCODONE (ROXICODONE) 5 MG immediate release tablet Take 1 tablet (5 mg total) by mouth every 4 (four) hours as needed for severe pain. Patient not taking: Reported on 08/24/2022 05/21/22   Maudie Flakes, MD  polyethylene glycol (MIRALAX / GLYCOLAX) 17 g packet Take 17 g by mouth 2 (two) times daily. Patient not taking: Reported on 05/15/2022 02/16/22   [name], MD  ramelteon (ROZEREM) 8 MG tablet Take 1 tablet (8 mg total) by mouth at bedtime. Patient not taking: Reported on 05/15/2022 02/16/22   [name], MD  senna-docusate (SENOKOT-S) 8.6-50 MG tablet Take 2 tablets by mouth 2 (two) times daily. Patient not taking: Reported on 05/15/2022 02/16/22   [name], MD  sevelamer carbonate (RENVELA) 800 MG tablet Take 2 tablets (1,600 mg total) by mouth 3 (three) times daily with meals. Patient not taking: Reported on 05/15/2022 02/16/22   [name], MD  torsemide (DEMADEX) 100 MG tablet Take 100 mg by mouth every morning. Patient not taking: Reported on 08/24/2022 03/31/22   [provider]  traZODone (DESYREL) 100 MG tablet Take 100 mg by mouth at bedtime. Patient not taking: Reported on 08/24/2022    [provider]      Allergies    Prozac [fluoxetine hcl], Wellbutrin [bupropion], and Prednisone    Review of Systems   Review of Systems  HENT:  Negative for facial swelling.   Eyes:  Negative for redness.  Respiratory:  Negative for wheezing and stridor.   Cardiovascular:  Positive for chest pain and leg swelling.  Gastrointestinal:  Negative  for nausea and vomiting.  Genitourinary:  Positive for flank pain.  All other systems reviewed and are negative.   Physical Exam Updated Vital Signs BP (!) 157/102 (BP Location: Right Arm)   Pulse 94   Temp 98.1 F (36.7 C) (Oral)   Resp 18   SpO2 98%  Physical Exam Vitals and nursing note reviewed.  Constitutional:      General: She is not in acute distress.    Appearance: She is well-developed.  HENT:     Head: Normocephalic and atraumatic.     Nose: Nose normal.  Eyes:     Pupils: Pupils are equal, round, and reactive to light.  Cardiovascular:     Rate and Rhythm: Normal rate and regular rhythm.     Pulses: Normal pulses.     Heart sounds: Normal heart sounds.  Pulmonary:     Effort: Pulmonary effort is normal. No respiratory distress.     Breath sounds: Rales present.  Abdominal:     General: Bowel sounds are normal. There is no distension.     Palpations: Abdomen is soft.     Tenderness: There is no abdominal tenderness. There is no guarding or rebound.  Genitourinary:  Vagina: No vaginal discharge.  Musculoskeletal:        General: Normal range of motion.     Cervical back: Neck supple.     Right lower leg: Edema present.     Left lower leg: Edema present.  Skin:    General: Skin is warm and dry.     Capillary Refill: Capillary refill takes less than 2 seconds.     Findings: No erythema or rash.  Neurological:     General: No focal deficit present.     Mental Status: She is oriented to person, place, and time.     Deep Tendon Reflexes: Reflexes normal.  Psychiatric:        Mood and Affect: Mood normal.        Behavior: Behavior normal.     ED Results / Procedures / Treatments   Labs (all labs ordered are listed, but only abnormal results are displayed) Results for orders placed or performed during the hospital encounter of 08/30/22  Comprehensive metabolic panel  Result Value Ref Range   Sodium 140 135 - 145 mmol/L   Potassium 4.1 3.5 - 5.1  mmol/L   Chloride 101 98 - 111 mmol/L   CO2 20 (L) 22 - 32 mmol/L   Glucose, Bld 122 (H) 70 - 99 mg/dL   BUN 106 (H) 6 - 20 mg/dL   Creatinine, Ser 12.92 (H) 0.44 - 1.00 mg/dL   Calcium 9.4 8.9 - 10.3 mg/dL   Total Protein 6.9 6.5 - 8.1 g/dL   Albumin 2.8 (L) 3.5 - 5.0 g/dL   AST 24 15 - 41 U/L   ALT 28 0 - 44 U/L   Alkaline Phosphatase 64 38 - 126 U/L   Total Bilirubin 0.6 0.3 - 1.2 mg/dL   GFR, Estimated 4 (L) >60 mL/min   Anion gap 19 (H) 5 - 15  CBC with Differential  Result Value Ref Range   WBC 13.9 (H) 4.0 - 10.5 K/uL   RBC 2.76 (L) 3.87 - 5.11 MIL/uL   Hemoglobin 8.6 (L) 12.0 - 15.0 g/dL   HCT 27.3 (L) 36.0 - 46.0 %   MCV 98.9 80.0 - 100.0 fL   MCH 31.2 26.0 - 34.0 pg   MCHC 31.5 30.0 - 36.0 g/dL   RDW 16.7 (H) 11.5 - 15.5 %   Platelets 311 150 - 400 K/uL   nRBC 0.0 0.0 - 0.2 %   Neutrophils Relative % 83 %   Neutro Abs 11.5 (H) 1.7 - 7.7 K/uL   Lymphocytes Relative 11 %   Lymphs Abs 1.5 0.7 - 4.0 K/uL   Monocytes Relative 4 %   Monocytes Absolute 0.6 0.1 - 1.0 K/uL   Eosinophils Relative 1 %   Eosinophils Absolute 0.2 0.0 - 0.5 K/uL   Basophils Relative 0 %   Basophils Absolute 0.1 0.0 - 0.1 K/uL   Immature Granulocytes 1 %   Abs Immature Granulocytes 0.10 (H) 0.00 - 0.07 K/uL  Brain natriuretic peptide  Result Value Ref Range   B Natriuretic Peptide 1,902.1 (H) 0.0 - 100.0 pg/mL  Troponin I (High Sensitivity)  Result Value Ref Range   Troponin I (High Sensitivity) 158 (HH) <18 ng/L  Troponin I (High Sensitivity)  Result Value Ref Range   Troponin I (High Sensitivity) 142 (HH) <18 ng/L   DG Chest 2 View  Result Date: 08/30/2022 CLINICAL DATA:  Chest pain EXAM: CHEST - 2 VIEW COMPARISON:  Chest x-ray 08/28/2022, chest x-ray 08/23/2022 FINDINGS: Similar-appearing cardiomegaly. The  heart and mediastinal contours are within normal limits. Low lung volumes. Hazy airspace opacities of bilateral lower lung zones. No pulmonary edema. Bilateral trace to small volume  pleural effusions with fluid noted extending within the minor and major fissures. No pneumothorax. No acute osseous abnormality. IMPRESSION: 1. Cardiomegaly with underlying pericardial effusion not excluded. 2. Bilateral trace to small volume pleural effusions. 3. Hazy airspace opacities of bilateral lower lung zones may represent a combination of atelectasis versus infection/inflammation. Electronically Signed   By: Iven Finn M.D.   On: 08/30/2022 23:56   DG CHEST PORT 1 VIEW  Result Date: 08/28/2022 CLINICAL DATA:  Dyspnea, cough EXAM: PORTABLE CHEST 1 VIEW COMPARISON:  Previous studies including the examination of 08/23/2022 FINDINGS: Transverse diameter of heart is increased. Central pulmonary vessels are prominent. Small to moderate bilateral pleural effusions are seen, more so on the right side with interval increase. Evaluation of lower lung fields for infiltrates is limited by the effusions. There is no pneumothorax. IMPRESSION: Marked cardiomegaly. Possibility of pericardial effusion is not excluded. Small to moderate bilateral pleural effusions, more so on the right side with interval increase. Possibility of underlying atelectasis/pneumonia in the lower lung fields is not excluded. Electronically Signed   By: Elmer Picker M.D.   On: 08/28/2022 11:28   ECHOCARDIOGRAM COMPLETE  Result Date: 08/25/2022    ECHOCARDIOGRAM REPORT   Patient Name:   KEIKO MYRICKS Date of Exam: 08/25/2022 Medical Rec #:  [id number]         Height:       68.0 in Accession #:    [id number]        Weight:       190.3 lb Date of Birth:  03/24/1996         BSA:          2.001 m Patient Age:    26 years          BP:           149/96 mmHg Patient Gender: F                 HR:           106 bpm. Exam Location:  Inpatient Procedure: 2D Echo, 3D Echo, Cardiac Doppler and Color Doppler Indications:    Chest pain  History:        Patient has prior history of Echocardiogram examinations.                  Signs/Symptoms:Chest Pain; Risk Factors:Hypertension. CKD.                 Polysubstance abuse.  Sonographer:    Clayton Lefort RDCS (AE) Referring Phys: [id number] RONDELL A SMITH IMPRESSIONS  1. Left ventricular ejection fraction, by estimation, is 45 to 50%. Left ventricular ejection fraction by 3D volume is 44 %. The left ventricle has mildly decreased function. The left ventricle demonstrates global hypokinesis. The left ventricular internal cavity size was mildly dilated. There is severe concentric left ventricular hypertrophy. Left ventricular diastolic parameters are indeterminate.  2. Right ventricular systolic function is normal. The right ventricular size is moderately enlarged. There is severely elevated pulmonary artery systolic pressure.  3. Moderate pericardial effusion. The pericardial effusion is circumferential. Large pleural effusion in the left lateral region.  4. The mitral valve is degenerative. Severe mitral valve regurgitation.  5. The tricuspid valve is abnormal. 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  6. The aortic valve is abnormal. There is mild calcification of  the aortic valve. Aortic valve regurgitation is not visualized. Aortic valve mean gradient measures 13.0 mmHg. Comparison(s): LVEF similar to prior. Pericardial effusion is similar to prior. Valve disease has worsened. Limited repeat study when euvolemic would be reasonable. FINDINGS  Left Ventricle: Left ventricular ejection fraction, by estimation, is 45 to 50%. Left ventricular ejection fraction by 3D volume is 44 %. The left ventricle has mildly decreased function. The left ventricle demonstrates global hypokinesis. The left ventricular internal cavity size was mildly dilated. There is severe concentric left ventricular hypertrophy. Left ventricular diastolic parameters are indeterminate. Right Ventricle: The right ventricular size is moderately enlarged. No increase in right ventricular wall thickness. Right ventricular systolic  function is normal. There is severely elevated pulmonary artery systolic pressure. The tricuspid regurgitant velocity is 3.54 m/s, and with an assumed right atrial pressure of 15 mmHg, the estimated right ventricular systolic pressure is 32.4 mmHg. Left Atrium: Left atrial size was normal in size. Right Atrium: Right atrial size was normal in size. Pericardium: A moderately sized pericardial effusion is present. The pericardial effusion is circumferential. Mitral Valve: The mitral valve is degenerative in appearance. Severe mitral valve regurgitation. MV peak gradient, 15.8 mmHg. The mean mitral valve gradient is 7.0 mmHg with average heart rate of 105 bpm. Tricuspid Valve: The tricuspid valve is abnormal. 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 Aortic Valve: The aortic valve is abnormal. There is mild calcification of the aortic valve. Aortic valve regurgitation is not visualized. Aortic valve mean gradient measures 13.0 mmHg. Aortic valve peak gradient measures 24.4 mmHg. Aortic valve area, by  VTI measures 1.91 cm. Pulmonic Valve: The pulmonic valve was normal in structure. Pulmonic valve regurgitation is not visualized. No evidence of pulmonic stenosis. Aorta: The aortic root and ascending aorta are structurally normal, with no evidence of dilitation. IAS/Shunts: No atrial level shunt detected by color flow Doppler. Additional Comments: There is a large pleural effusion in the left lateral region.  LEFT VENTRICLE PLAX 2D LVIDd:         5.40 cm         Diastology LVIDs:         4.10 cm         LV e' medial:    6.53 cm/s LV PW:         1.80 cm         LV E/e' medial:  25.9 LV IVS:        1.90 cm         LV e' lateral:   9.03 cm/s LVOT diam:     2.10 cm         LV E/e' lateral: 18.7 LV SV:         70 LV SV Index:   35 LVOT Area:     3.46 cm        3D Volume EF                                LV 3D EF:    Left                                             ventricul LV Volumes (MOD)  ar LV  vol d, MOD    154.0 ml                   ejection A4C:                                        fraction LV vol s, MOD    82.1 ml                    by 3D A4C:                                        volume is LV SV MOD A4C:   154.0 ml                   44 %.                                 3D Volume EF:                                3D EF:        44 %                                LV EDV:       262 ml                                LV ESV:       146 ml                                LV SV:        115 ml RIGHT VENTRICLE             IVC RV Basal diam:  4.90 cm     IVC diam: 2.30 cm RV Mid diam:    4.10 cm RV S prime:     17.40 cm/s TAPSE (M-mode): 2.0 cm LEFT ATRIUM             Index        RIGHT ATRIUM           Index LA diam:        3.50 cm 1.75 cm/m   RA Area:     20.00 cm LA Vol (A2C):   61.0 ml 30.49 ml/m  RA Volume:   56.80 ml  28.39 ml/m LA Vol (A4C):   58.1 ml 29.04 ml/m LA Biplane Vol: 62.1 ml 31.04 ml/m  AORTIC VALVE AV Area (Vmax):    1.92 cm AV Area (Vmean):   1.90 cm AV Area (VTI):     1.91 cm AV Vmax:           247.00 cm/s AV Vmean:          170.000 cm/s AV VTI:            0.368 m AV Peak Grad:      24.4 mmHg AV Mean Grad:  13.0 mmHg LVOT Vmax:         137.00 cm/s LVOT Vmean:        93.500 cm/s LVOT VTI:          0.203 m LVOT/AV VTI ratio: 0.55  AORTA Ao Root diam: 3.00 cm Ao Asc diam:  3.20 cm MITRAL VALVE                TRICUSPID VALVE MV Area (PHT): 6.48 cm     TR Peak grad:   50.1 mmHg MV Area VTI:   1.72 cm     TR Vmax:        354.00 cm/s MV Peak grad:  15.8 mmHg MV Mean grad:  7.0 mmHg     SHUNTS MV Vmax:       1.99 m/s     Systemic VTI:  0.20 m MV Vmean:      128.0 cm/s   Systemic Diam: 2.10 cm MV Decel Time: 117 msec MR Peak grad: 146.9 mmHg MR Mean grad: 89.0 mmHg MR Vmax:      606.00 cm/s MR Vmean:     435.0 cm/s MV E velocity: 169.00 cm/s MV A velocity: 137.00 cm/s MV E/A ratio:  1.23 Rudean Haskell MD Electronically signed by Rudean Haskell MD Signature Date/Time:  08/25/2022/5:44:51 PM    Final    DG Wrist 2 Views Left  Result Date: 08/24/2022 CLINICAL DATA:  Pain left wrist x2 weeks EXAM: LEFT WRIST - 2 VIEW COMPARISON:  None Available. FINDINGS: No recent displaced fracture or dislocation is seen. There is old healed fracture in the shaft of fifth metacarpal. Radiolucencies are seen in the lunate and scaphoid. There is narrowing of radiocarpal joint space. There are amorphous calcifications in the soft tissues along the medial and lateral aspect of the wrist and in the soft tissues between the first and second metacarpals. IMPRESSION: No recent displaced fracture or dislocation is seen. Radiolucencies are seen in lunate and scaphoid along with joint space narrowing in the radiocarpal joint. Findings may be due to degenerative arthritis. Possibility of infectious process in the lunate and scaphoid is not excluded. Amorphous soft tissue calcifications are seen adjacent to the wrist and in the soft tissues between first and second metacarpals. This may suggest old soft tissue injury or residual changes from surgery. Electronically Signed   By: Elmer Picker M.D.   On: 08/24/2022 16:32   DG Chest 2 View  Result Date: 08/23/2022 CLINICAL DATA:  Chest pain EXAM: CHEST - 2 VIEW COMPARISON:  06/30/2022 FINDINGS: Lung volumes are small. Small bilateral pleural effusions are present. Right basilar peripheral opacity may represent a peripherally loculated pleural fluid component. Patchy atelectasis or infiltrate is seen at the left lung base. No pneumothorax. Mild cardiomegaly is stable. Perihilar interstitial pulmonary edema persists in keeping with mild cardiogenic failure. IMPRESSION: 1. Mild cardiogenic failure with small bilateral pleural effusions. 2. Left basilar atelectasis or infiltrate. 3. Peripheral right basilar opacity possibly representing a loculated pleural fluid component. Electronically Signed   By: Fidela Salisbury M.D.   On: 08/23/2022 21:46      EKG See epic     Radiology DG Chest 2 View  Result Date: 08/30/2022 CLINICAL DATA:  Chest pain EXAM: CHEST - 2 VIEW COMPARISON:  Chest x-ray 08/28/2022, chest x-ray 08/23/2022 FINDINGS: Similar-appearing cardiomegaly. The heart and mediastinal contours are within normal limits. Low lung volumes. Hazy airspace opacities of bilateral lower lung zones. No pulmonary edema. Bilateral trace to small volume pleural effusions with  fluid noted extending within the minor and major fissures. No pneumothorax. No acute osseous abnormality. IMPRESSION: 1. Cardiomegaly with underlying pericardial effusion not excluded. 2. Bilateral trace to small volume pleural effusions. 3. Hazy airspace opacities of bilateral lower lung zones may represent a combination of atelectasis versus infection/inflammation. Electronically Signed   By: Iven Finn M.D.   On: 08/30/2022 23:56    Procedures Procedures    Medications Ordered in ED Medications  ceFEPIme (MAXIPIME) 1 g in sodium chloride 0.9 % 100 mL IVPB (has no administration in time range)  nitroGLYCERIN (NITROGLYN) 2 % ointment 0.5 inch (has no administration in time range)  vancomycin (VANCOCIN) IVPB 1000 mg/200 mL premix (has no administration in time range)  acetaminophen (TYLENOL) tablet 1,000 mg (has no administration in time range)     ED Course/ Medical Decision Making/ A&P                           Medical Decision Making Patient with ESRD and polysubstance abuse presents with chest pain and B flank pain and edema   Amount and/or Complexity of Data Reviewed External Data Reviewed: notes.    Details: Previous admission notes reviewed Labs: ordered.    Details: All labs reviewed:  elevated 13.9, low hemoglobin 8.6, normal platelet count.  Normal sodium 140 and normal potassium 4.1 , BUN markedly elevated 104, elevated creatinine 12.92.  Normal LFTS .  Troponins elevated 158 and 142.  Blood cultures sent and are pending at this  time Radiology: ordered and independent interpretation performed.    Details: Pleural effusion by me, cardiomegaly on CXR  ECG/medicine tests: ordered and independent interpretation performed.    Details: HR 100, normal PR and QRS and QTC cannot rule out anterior infarct  Discussion of management or test interpretation with external provider(s): Dr. Marval Regal will get patient on list for dialysis today   Risk OTC drugs. Prescription drug management. Decision regarding hospitalization. Risk Details: Patient with pneumonia which is now HCAP and missed dialysis with chest pain and elevated troponins.  Will need admission for pneumonia and dialysis and is willing to stay.  The patient appears reasonably stabilized for admission considering the current resources, flow, and capabilities available in the ED at this time, and I doubt any other [facility] requiring further screening and/or treatment in the ED prior to admission.     Final Clinical Impression(s) / ED Diagnoses Final diagnoses:  Elevated troponin I level  Flank pain  ESRD on dialysis [facility])    Admit to medicine      Vanden Fawaz, MD 08/31/22 [phone]
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
## 2022-08-31 NOTE — Progress Notes (Signed)
Pharmacy Antibiotic Note  Natasha Long is a 26 y.o. female admitted on 08/30/2022 with pneumonia.  Pharmacy has been consulted for Vancomycin dosing.  Wbc elevated. Tmax 99.1.  ESRD - HD.   Plan: Vancomycin 2000 mg IV x 1 Follow-up HD schedule for further dosing     Temp (24hrs), Avg:98.6 F (37 C), Min:98.1 F (36.7 C), Max:99.1 F (37.3 C)  Recent Labs  Lab 08/24/22 1024 08/25/22 0416 08/26/22 0824 08/30/22 2341  WBC 9.3 8.2  --  13.9*  CREATININE 11.66* 8.48* 10.02* 12.92*    Estimated Creatinine Clearance: 7.4 mL/min (A) (by C-G formula based on SCr of 12.92 mg/dL (H)).    Allergies  Allergen Reactions   Prozac [Fluoxetine Hcl] Anxiety and Other (See Comments)    Caused panic attacks   Wellbutrin [Bupropion] Anxiety and Other (See Comments)    Caused panic attacks   Prednisone Other (See Comments)    Pt stated this med caused pancreatitis    Antimicrobials this admission: Vancomycin 10/2  >>  Dose adjustments this admission:  Microbiology results: pending  Thank you for allowing pharmacy to be a part of this patient's care.  Brain Hilts 08/31/2022 5:48 AM

## 2022-09-01 DIAGNOSIS — N289 Disorder of kidney and ureter, unspecified: Secondary | ICD-10-CM | POA: Diagnosis not present

## 2022-09-01 DIAGNOSIS — E8770 Fluid overload, unspecified: Secondary | ICD-10-CM | POA: Diagnosis not present

## 2022-09-01 LAB — RENAL FUNCTION PANEL
Albumin: 2.4 g/dL — ABNORMAL LOW (ref 3.5–5.0)
Anion gap: 16 — ABNORMAL HIGH (ref 5–15)
BUN: 73 mg/dL — ABNORMAL HIGH (ref 6–20)
CO2: 22 mmol/L (ref 22–32)
Calcium: 9.5 mg/dL (ref 8.9–10.3)
Chloride: 102 mmol/L (ref 98–111)
Creatinine, Ser: 9.48 mg/dL — ABNORMAL HIGH (ref 0.44–1.00)
GFR, Estimated: 5 mL/min — ABNORMAL LOW (ref >60)
Glucose, Bld: 94 mg/dL (ref 70–99)
Phosphorus: 7.6 mg/dL — ABNORMAL HIGH (ref 2.5–4.6)
Potassium: 4.4 mmol/L (ref 3.5–5.1)
Sodium: 140 mmol/L (ref 135–145)

## 2022-09-01 LAB — CBC WITH DIFFERENTIAL/PLATELET
Abs Immature Granulocytes: 0.03 10*3/uL (ref 0.00–0.07)
Basophils Absolute: 0 10*3/uL (ref 0.0–0.1)
Basophils Relative: 0 %
Eosinophils Absolute: 0.1 10*3/uL (ref 0.0–0.5)
Eosinophils Relative: 1 %
HCT: 23.5 % — ABNORMAL LOW (ref 36.0–46.0)
Hemoglobin: 7.5 g/dL — ABNORMAL LOW (ref 12.0–15.0)
Immature Granulocytes: 0 %
Lymphocytes Relative: 15 %
Lymphs Abs: 1.6 10*3/uL (ref 0.7–4.0)
MCH: 31 pg (ref 26.0–34.0)
MCHC: 31.9 g/dL (ref 30.0–36.0)
MCV: 97.1 fL (ref 80.0–100.0)
Monocytes Absolute: 0.6 10*3/uL (ref 0.1–1.0)
Monocytes Relative: 6 %
Neutro Abs: 8.1 10*3/uL — ABNORMAL HIGH (ref 1.7–7.7)
Neutrophils Relative %: 78 %
Platelets: 268 10*3/uL (ref 150–400)
RBC: 2.42 MIL/uL — ABNORMAL LOW (ref 3.87–5.11)
RDW: 16.5 % — ABNORMAL HIGH (ref 11.5–15.5)
WBC: 10.4 10*3/uL (ref 4.0–10.5)
nRBC: 0 % (ref 0.0–0.2)

## 2022-09-01 LAB — CULTURE, BLOOD (ROUTINE X 2)
Culture: NO GROWTH
Culture: NO GROWTH
Special Requests: ADEQUATE
Special Requests: ADEQUATE

## 2022-09-01 MED ORDER — MUPIROCIN 2 % EX OINT
1.0000 | TOPICAL_OINTMENT | Freq: Two times a day (BID) | CUTANEOUS | Status: DC
  Administered 2022-09-01: 1 via NASAL
  Filled 2022-09-01: qty 22

## 2022-09-01 MED ORDER — GABAPENTIN 300 MG PO CAPS
300.0000 mg | ORAL_CAPSULE | Freq: Two times a day (BID) | ORAL | Status: DC
  Administered 2022-09-01 – 2022-09-02 (×2): 300 mg via ORAL
  Filled 2022-09-01 (×2): qty 1

## 2022-09-01 MED ORDER — HEPARIN SODIUM (PORCINE) 1000 UNIT/ML DIALYSIS: 2500.0000 [IU] | INTRAMUSCULAR | Status: DC | PRN

## 2022-09-01 MED ORDER — CHLORHEXIDINE GLUCONATE CLOTH 2 % EX PADS
6.0000 | MEDICATED_PAD | Freq: Every day | CUTANEOUS | Status: DC
  Administered 2022-09-01: 6 via TOPICAL

## 2022-09-01 NOTE — Progress Notes (Addendum)
Initial Nutrition Assessment  DOCUMENTATION CODES:   Not applicable  INTERVENTION:   Continue renal diet with 1200 ml fluid restriction.     Continue Nepro Shake po TID, each supplement provides 425 kcal and 19 grams protein.   Continue renal MVI daily.  NUTRITION DIAGNOSIS:   Increased nutrient needs related to chronic illness (ESRD on HD) as evidenced by estimated needs.  GOAL:   Patient will meet greater than or equal to 90% of their needs  MONITOR:   PO intake, Supplement acceptance, Labs, I & O's, Weight trends  REASON FOR ASSESSMENT:   Malnutrition Screening Tool    ASSESSMENT:   26 yo female admitted with back and flank pain after missing HD on Saturday. [facility] Saturday. PMH includes ESRD on HD secondary to nephrotic syndrome, HTN, insomnia, PTSD, peripheral neuropathy, polysubstance abuse, mood disorder.  Patient is on a renal diet with 1200 ml fluid restriction. Meal intakes not recorded. Last admission she was eating well. She left before receiving HD on Saturday. Patient is currently in HD. Weight history reviewed. Weight seems to fluctuate with volume status. Current weight is 88.2 kg, up from 85.3 kg on 9/28 d/t missing volume status. Unknown EDW. Patient recently moved back to [place] from [place]. She is homeless, so may have issues with access to food.   Labs reviewed. BUN 73 (H), creatinine 9.48 (H), phos 7.6 (H), K 4.4 (WNL)  Medications reviewed and include calcitriol, Phoslo, Rena-vit, Renvela.  NUTRITION - FOCUSED PHYSICAL EXAM:  Unable to complete  Diet Order:   Diet Order             Diet renal with fluid restriction Fluid restriction: 1200 mL Fluid; Room service appropriate? Yes; Fluid consistency: Thin  Diet effective now                   EDUCATION NEEDS:   Not appropriate for education at this time  Skin:  Skin Assessment: Reviewed RN Assessment  Last BM:  no BM documented  Height:   Ht Readings from Last  1 Encounters:  08/31/22 5\' 8"  (1.727 m)    Weight:   Wt Readings from Last 1 Encounters:  09/01/22 88.2 kg    Ideal Body Weight:  63.6 kg  BMI:  Body mass index is 29.57 kg/m.  Estimated Nutritional Needs:   Kcal:  1900-2100  Protein:  100-120 gm  Fluid:  1 L + UOP   Lucas Mallow RD, LDN, CNSC Please refer to Amion for contact information.

## 2022-09-01 NOTE — Progress Notes (Signed)
PROGRESS NOTE   Natasha Long  JOA:[id number] DOB: [birth year] DOA: 08/30/2022 PCP: [name], MD  Brief Narrative:   26 year old homeless lady [used to live in [place] and then moved back to [place]]  ESRD 2/2 nephrotic syndrome TTS. 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 Polysubstance abuse-cocaine tobacco marijuana  Chronic pericardial effusion--was supposed to be evaluated last hospitalization with echo after fluid overload resolved  Recently admitted 9/24 through 9/30 with chest pain EF 45-50% it was thought to be secondary to fluid overload and recommended volume management with HD per cardiology at the time--she was treated empirically for pneumonia with Rocephin doxycycline but decided to leave [place] on 9/30  Return with back and flank pain in the setting of missed dialysis  Hospital-Problem based course  Chest pain likely secondary to missed dialysis and volume overload EF 45 to 50% 9/26+ chronic pericardial effusion Repeat echocardiogram once more euvolemic after serial HD (probably on 10/5) Troponin has downward trended since last admission from 300 range to the 140 range so this may all be related to her underlying ESRD See below discussion  ?  Pneumonia Given Vanco and cefepime in the ED--last admission received 5 days of antibiotics so would not further treat but can get an x-ray on 10/4 after dialysis to see if there was any further infiltrate-if no fever no chills would hold off  Back pain Was resumed on Neurontin 300 3 times daily I will space this out to 300 daily because of dialysis Continue Robaxin 500 every 8   polysubstance abuse-cocaine marijuana tobacco Borderline personality Apparently noncompliant on meds Continue on clonidine for withdrawal ESRD Continue trazodone 100 at bedtime Ambien 5 and Abilify 7.5 daily  ESRD Anemia renal disease Continue torsemide 100 every morning  per nephrology Cozaar 50 Continue calcitriol 0.25 TTS PhosLo 667 3 times a day and Renvela 1600 3 times daily Nephrology following iron studies and will replace as needed  DVT prophylaxis: Heparin Code Status: Full Family Communication: None Disposition:  Status is: Inpatient Remains inpatient appropriate because:   Nephrology needs concurrent dialysis sessions and a safe plan for discharge     Consultants:    Procedures:   Antimicrobials:     Subjective: She is not very verbal and does not really seem to want to talk to me this morning Tells me that she plans to go to [facility] after discharge I mention to her that there were several tests that needed to be done during last hospitalization including an echo-she is agreeable to stay  Objective: Vitals:   08/31/22 1820 08/31/22 1921 09/01/22 0458 09/01/22 0500  BP: (!) 164/105 (!) 146/111 (!) 160/107   Pulse: 90 95 92   Resp: (!) 21 18 18    Temp: 97.8 F (36.6 C) 98.4 F (36.9 C) 98.1 F (36.7 C)   TempSrc: Oral Oral Oral   SpO2: 94% 97% 100%   Weight:  85.5 kg  85.5 kg  Height:  5\' 8"  (1.727 m)      Intake/Output Summary (Last 24 hours) at 09/01/2022 0741 Last data filed at 09/01/2022 0200 Gross per 24 hour  Intake 0 ml  Output 2 ml  Net -2 ml   Filed Weights   08/31/22 1921 09/01/22 0500  Weight: 85.5 kg 85.5 kg    Examination: Coherent-refuses to open eyes Seen on HD unit Chest is clear Abdomen is soft Lower extremities not swollen I was unable to examine the rest of her because of  her disagreement to do so    Data Reviewed: personally reviewed   CBC    Component Value Date/Time   WBC 13.9 (H) 08/30/2022 2341   RBC 2.76 (L) 08/30/2022 2341   HGB 8.6 (L) 08/30/2022 2341   HGB 14.5 04/01/2018 1635   HCT 27.3 (L) 08/30/2022 2341   HCT 41.8 04/01/2018 1635   PLT 311 08/30/2022 2341   PLT 260 04/01/2018 1635   MCV 98.9 08/30/2022 2341   MCV 88 04/01/2018 1635   MCH 31.2 08/30/2022  2341   MCHC 31.5 08/30/2022 2341   RDW 16.7 (H) 08/30/2022 2341   RDW 13.5 04/01/2018 1635   LYMPHSABS 1.5 08/30/2022 2341   LYMPHSABS 3.0 04/01/2018 1635   MONOABS 0.6 08/30/2022 2341   EOSABS 0.2 08/30/2022 2341   EOSABS 0.2 04/01/2018 1635   BASOSABS 0.1 08/30/2022 2341   BASOSABS 0.0 04/01/2018 1635      Latest Ref Rng & Units 08/30/2022   11:41 PM 08/26/2022    8:24 AM 08/25/2022    4:16 AM  CMP  Glucose 70 - 99 mg/dL 122  106  104   BUN 6 - 20 mg/dL 106  68  49   Creatinine 0.44 - 1.00 mg/dL 12.92  10.02  8.48   Sodium 135 - 145 mmol/L 140  138  138   Potassium 3.5 - 5.1 mmol/L 4.1  3.9  4.0   Chloride 98 - 111 mmol/L 101  100  100   CO2 22 - 32 mmol/L 20  24  24    Calcium 8.9 - 10.3 mg/dL 9.4  10.1  9.8   Total Protein 6.5 - 8.1 g/dL 6.9     Total Bilirubin 0.3 - 1.2 mg/dL 0.6     Alkaline Phos 38 - 126 U/L 64     AST 15 - 41 U/L 24     ALT 0 - 44 U/L 28        Radiology Studies: CT Renal Stone Study  Result Date: 08/31/2022 CLINICAL DATA:  Flank pain, kidney stone suspected. EXAM: CT ABDOMEN AND PELVIS WITHOUT CONTRAST TECHNIQUE: Multidetector CT imaging of the abdomen and pelvis was performed following the standard protocol without IV contrast. RADIATION DOSE REDUCTION: This exam was performed according to the departmental dose-optimization program which includes automated exposure control, adjustment of the mA and/or kV according to patient size and/or use of iterative reconstruction technique. COMPARISON:  CT renal stone protocol 03/18/2021 FINDINGS: Lower chest: Bilateral moderate pleural effusions with adjacent compressive atelectasis in the lower lobes. Calcified granuloma in the right middle lobe. Cardiomegaly. Calcification of the mitral valve. Hypodense blood pool. Hepatobiliary: No focal liver abnormality is seen. Status post cholecystectomy. No biliary dilatation. Pancreas: Unremarkable. No pancreatic ductal dilatation or surrounding inflammatory changes. Spleen:  The spleen is borderline enlarged, measuring 13.6 cm in length. Adrenals/Urinary Tract: Adrenal glands are unremarkable. Diffuse cortical thinning of the kidneys. No hydronephrosis or nephrolithiasis. No perinephric fat stranding. Bladder is minimally distended and not well evaluated. No bladder stone. Stomach/Bowel: Stomach is within normal limits. Appendix appears normal. No evidence of bowel wall thickening, distention, or inflammatory changes. Vascular/Lymphatic: No significant vascular findings are present. No enlarged abdominal or pelvic lymph nodes. Reproductive: Uterus and bilateral adnexa are unremarkable. Other: A small amount of ascites is noted in the right upper abdomen and in the posterior cul-de-sac. Diffuse anasarca. Musculoskeletal: No acute or suspicious osseous findings. Severe degenerative disc disease at L5-S1 vacuum disc phenomenon. Increased sclerosis with irregular joint endplates of the  bilateral sacroiliac joints. IMPRESSION: 1. No nephrolithiasis or hydronephrosis as queried. Atrophic kidneys, consistent with medical renal disease. 2. Moderate bilateral pleural effusions, diffuse anasarca, and small amount of abdominopelvic ascites, consistent with fluid overload. 3. Bilateral symmetric sacroiliitis. 4. Borderline enlargement of the spleen. 5. Hypodense blood pool, suggestive of anemia. Electronically Signed   By: Ileana Roup M.D.   On: 08/31/2022 12:59   DG Chest 2 View  Result Date: 08/30/2022 CLINICAL DATA:  Chest pain EXAM: CHEST - 2 VIEW COMPARISON:  Chest x-ray 08/28/2022, chest x-ray 08/23/2022 FINDINGS: Similar-appearing cardiomegaly. The heart and mediastinal contours are within normal limits. Low lung volumes. Hazy airspace opacities of bilateral lower lung zones. No pulmonary edema. Bilateral trace to small volume pleural effusions with fluid noted extending within the minor and major fissures. No pneumothorax. No acute osseous abnormality. IMPRESSION: 1. Cardiomegaly with  underlying pericardial effusion not excluded. 2. Bilateral trace to small volume pleural effusions. 3. Hazy airspace opacities of bilateral lower lung zones may represent a combination of atelectasis versus infection/inflammation. Electronically Signed   By: Iven Finn M.D.   On: 08/30/2022 23:56     Scheduled Meds:  ARIPiprazole  7.5 mg Oral Daily   calcitRIOL  0.25 mcg Oral Q T,Th,Sa-HD   calcium acetate  667 mg Oral TID WC   Chlorhexidine Gluconate Cloth  6 each Topical Q0600   cloNIDine  0.1 mg Oral QID   Followed by   Derrill Memo ON 09/02/2022] cloNIDine  0.1 mg Oral BH-qamhs   Followed by   Derrill Memo ON 09/04/2022] cloNIDine  0.1 mg Oral QAC breakfast   heparin  5,000 Units Subcutaneous Q8H   losartan  50 mg Oral Daily   multivitamin  1 tablet Oral Daily   mupirocin ointment  1 Application Nasal BID   sevelamer carbonate  1,600 mg Oral TID WC   sodium chloride flush  3 mL Intravenous Q12H   torsemide  100 mg Oral q morning   traZODone  100 mg Oral QHS   Continuous Infusions:   LOS: 1 day   Time spent: [place], MD [facility] To contact the attending provider between 7A-7P or the covering provider during after hours 7P-7A, please log into the web site [email or web address] and access using universal [place] password for that web site. If you do not have the password, please call the hospital operator.  09/01/2022, 7:41 AM

## 2022-09-01 NOTE — Progress Notes (Signed)
[facility] Progress Note   Subjective:    Seen and examined patient on HD. Denies SOB and CP. Received HD yesterday and removed 2L. HD again today-UFG set 3L. She is inquiring about her iron injections.  Objective Vitals:   08/31/22 1921 09/01/22 0458 09/01/22 0500 09/01/22 0840  BP: (!) 146/111 (!) 160/107  (!) 157/101  Pulse: 95 92  (!) 101  Resp: 18 18  (!) 22  Temp: 98.4 F (36.9 C) 98.1 F (36.7 C)  98.2 F (36.8 C)  TempSrc: Oral Oral    SpO2: 97% 100%  90%  Weight: 85.5 kg  85.5 kg 88.2 kg  Height: 5\' 8"  (1.727 m)      Physical Exam General: Alert; NAD Heart: S1 and S2; MRGs Lungs: Clear anteriorly; No wheezing, rales, or rhonchi Abdomen: Soft and non-tender Extremities: trace BL LE edema Dialysis Access: LUA AVF (+) B/T   Filed Weights   08/31/22 1921 09/01/22 0500 09/01/22 0840  Weight: 85.5 kg 85.5 kg 88.2 kg    Intake/Output Summary (Last 24 hours) at 09/01/2022 0927 Last data filed at 09/01/2022 0600 Gross per 24 hour  Intake 240 ml  Output 2 ml  Net 238 ml    Additional Objective Labs: Basic Metabolic Panel: Recent Labs  Lab 08/26/22 0824 08/30/22 2341  NA 138 140  K 3.9 4.1  CL 100 101  CO2 24 20*  GLUCOSE 106* 122*  BUN 68* 106*  CREATININE 10.02* 12.92*  CALCIUM 10.1 9.4  PHOS 7.2*  --    Liver Function Tests: Recent Labs  Lab 08/26/22 0824 08/30/22 2341  AST  --  24  ALT  --  28  ALKPHOS  --  64  BILITOT  --  0.6  PROT  --  6.9  ALBUMIN 2.6* 2.8*   No results for input(s): "LIPASE", "AMYLASE" in the last 168 hours. CBC: Recent Labs  Lab 08/30/22 2341  WBC 13.9*  NEUTROABS 11.5*  HGB 8.6*  HCT 27.3*  MCV 98.9  PLT 311   Blood Culture    Component Value Date/Time   SDES BLOOD RIGHT ANTECUBITAL 08/31/2022 0630   SDES BLOOD RIGHT ANTECUBITAL 08/31/2022 0630   SPECREQUEST  08/31/2022 0630    BOTTLES DRAWN AEROBIC AND ANAEROBIC Blood Culture adequate volume   SPECREQUEST  08/31/2022 0630    BOTTLES  DRAWN AEROBIC AND ANAEROBIC Blood Culture results may not be optimal due to an inadequate volume of blood received in culture bottles   CULT  08/31/2022 0630    NO GROWTH < 24 HOURS Performed at [facility] [address] [place], [place] [address]    CULT  08/31/2022 0630    NO GROWTH < 24 HOURS Performed at [facility] [address], [place], [place] [address]    REPTSTATUS PENDING 08/31/2022 0630   REPTSTATUS PENDING 08/31/2022 0630    Cardiac Enzymes: No results for input(s): "CKTOTAL", "CKMB", "CKMBINDEX", "TROPONINI" in the last 168 hours. CBG: No results for input(s): "GLUCAP" in the last 168 hours. Iron Studies: No results for input(s): "IRON", "TIBC", "TRANSFERRIN", "FERRITIN" in the last 72 hours. Lab Results  Component Value Date   INR 1.4 (H) 01/30/2022   INR 1.0 08/17/2019   Studies/Results: CT Renal Stone Study  Result Date: 08/31/2022 CLINICAL DATA:  Flank pain, kidney stone suspected. EXAM: CT ABDOMEN AND PELVIS WITHOUT CONTRAST TECHNIQUE: Multidetector CT imaging of the abdomen and pelvis was performed following the standard protocol without IV contrast. RADIATION DOSE  REDUCTION: This exam was performed according to the departmental dose-optimization program which includes automated exposure control, adjustment of the mA and/or kV according to patient size and/or use of iterative reconstruction technique. COMPARISON:  CT renal stone protocol 03/18/2021 FINDINGS: Lower chest: Bilateral moderate pleural effusions with adjacent compressive atelectasis in the lower lobes. Calcified granuloma in the right middle lobe. Cardiomegaly. Calcification of the mitral valve. Hypodense blood pool. Hepatobiliary: No focal liver abnormality is seen. Status post cholecystectomy. No biliary dilatation. Pancreas: Unremarkable. No pancreatic ductal dilatation or surrounding inflammatory changes. Spleen: The spleen is borderline enlarged, measuring 13.6 cm in length.  Adrenals/Urinary Tract: Adrenal glands are unremarkable. Diffuse cortical thinning of the kidneys. No hydronephrosis or nephrolithiasis. No perinephric fat stranding. Bladder is minimally distended and not well evaluated. No bladder stone. Stomach/Bowel: Stomach is within normal limits. Appendix appears normal. No evidence of bowel wall thickening, distention, or inflammatory changes. Vascular/Lymphatic: No significant vascular findings are present. No enlarged abdominal or pelvic lymph nodes. Reproductive: Uterus and bilateral adnexa are unremarkable. Other: A small amount of ascites is noted in the right upper abdomen and in the posterior cul-de-sac. Diffuse anasarca. Musculoskeletal: No acute or suspicious osseous findings. Severe degenerative disc disease at L5-S1 vacuum disc phenomenon. Increased sclerosis with irregular joint endplates of the bilateral sacroiliac joints. IMPRESSION: 1. No nephrolithiasis or hydronephrosis as queried. Atrophic kidneys, consistent with medical renal disease. 2. Moderate bilateral pleural effusions, diffuse anasarca, and small amount of abdominopelvic ascites, consistent with fluid overload. 3. Bilateral symmetric sacroiliitis. 4. Borderline enlargement of the spleen. 5. Hypodense blood pool, suggestive of anemia. Electronically Signed   By: Ileana Roup M.D.   On: 08/31/2022 12:59   DG Chest 2 View  Result Date: 08/30/2022 CLINICAL DATA:  Chest pain EXAM: CHEST - 2 VIEW COMPARISON:  Chest x-ray 08/28/2022, chest x-ray 08/23/2022 FINDINGS: Similar-appearing cardiomegaly. The heart and mediastinal contours are within normal limits. Low lung volumes. Hazy airspace opacities of bilateral lower lung zones. No pulmonary edema. Bilateral trace to small volume pleural effusions with fluid noted extending within the minor and major fissures. No pneumothorax. No acute osseous abnormality. IMPRESSION: 1. Cardiomegaly with underlying pericardial effusion not excluded. 2. Bilateral trace  to small volume pleural effusions. 3. Hazy airspace opacities of bilateral lower lung zones may represent a combination of atelectasis versus infection/inflammation. Electronically Signed   By: Iven Finn M.D.   On: 08/30/2022 23:56    Medications:   ARIPiprazole  7.5 mg Oral Daily   calcitRIOL  0.25 mcg Oral Q T,Th,Sa-HD   calcium acetate  667 mg Oral TID WC   Chlorhexidine Gluconate Cloth  6 each Topical Q0600   cloNIDine  0.1 mg Oral QID   Followed by   Derrill Memo ON 09/02/2022] cloNIDine  0.1 mg Oral BH-qamhs   Followed by   Derrill Memo ON 09/04/2022] cloNIDine  0.1 mg Oral QAC breakfast   heparin  5,000 Units Subcutaneous Q8H   losartan  50 mg Oral Daily   multivitamin  1 tablet Oral Daily   mupirocin ointment  1 Application Nasal BID   sevelamer carbonate  1,600 mg Oral TID WC   sodium chloride flush  3 mL Intravenous Q12H   torsemide  100 mg Oral q morning   traZODone  100 mg Oral QHS    Dialysis Orders: Patient recently moved back to [place] from [place], [place] where she was receiving HD MWF with [facility]. Patient now clipped back to [facility] TTS:  [facility] TTS 3.5h  350/1.5  2/2.5 bath  Hep 5000   LUA AVF   Assessment/Plan: Chest pain - trops flat, per pmd Poss PNA-CXR showed BL infiltrates. ABX given in ED but now on hold. No evidence for infection at this time. Monitor closely. Polysubstance abuse- Previous drug screen (+) cocaine and THC. Continue COWS protocol and Clonidine withdrawal. Managed by primary ESRD - had moved to [facility]) but is now back in [place]. Pt is homeless. Was CLIP'd back to [facility] during recent admit. Missed HD Sat, has sig LE edema w/o any resp issues. Received short HD tyesterday and then again today to get back on schedule.  Anemia esrd - Hb 8.6, will order iron studies and obtain outpatient records BMD ckd - CCa a bit high, obtaining records. Cont renvela, hold phoslo.  HTN - cont home meds ARB, torsemide.   Tobie Poet, NP [facility] 09/01/2022,9:27 AM  LOS: 1 day

## 2022-09-02 ENCOUNTER — Other Ambulatory Visit (HOSPITAL_COMMUNITY): Payer: Self-pay

## 2022-09-02 ENCOUNTER — Inpatient Hospital Stay (HOSPITAL_COMMUNITY): Payer: Medicaid Other

## 2022-09-02 DIAGNOSIS — M5440 Lumbago with sciatica, unspecified side: Secondary | ICD-10-CM

## 2022-09-02 DIAGNOSIS — E8770 Fluid overload, unspecified: Secondary | ICD-10-CM

## 2022-09-02 DIAGNOSIS — F603 Borderline personality disorder: Secondary | ICD-10-CM

## 2022-09-02 DIAGNOSIS — Z992 Dependence on renal dialysis: Secondary | ICD-10-CM

## 2022-09-02 DIAGNOSIS — R7989 Other specified abnormal findings of blood chemistry: Secondary | ICD-10-CM

## 2022-09-02 DIAGNOSIS — N186 End stage renal disease: Secondary | ICD-10-CM

## 2022-09-02 DIAGNOSIS — N289 Disorder of kidney and ureter, unspecified: Secondary | ICD-10-CM

## 2022-09-02 MED ORDER — HYDROXYZINE PAMOATE 25 MG PO CAPS
25.0000 mg | ORAL_CAPSULE | Freq: Three times a day (TID) | ORAL | 2 refills | Status: DC | PRN
  Filled 2022-09-02: qty 60, 20d supply, fill #0
  Filled 2022-11-27: qty 60, 20d supply, fill #1
  Filled 2022-12-07 (×2): qty 60, 20d supply, fill #0

## 2022-09-02 MED ORDER — NEPHRO-VITE 0.8 MG PO TABS
1.0000 | ORAL_TABLET | Freq: Every day | ORAL | 1 refills | Status: DC
  Filled 2022-09-02: qty 30, 30d supply, fill #0
  Filled 2022-11-27: qty 30, 30d supply, fill #1

## 2022-09-02 MED ORDER — GABAPENTIN 600 MG PO TABS
300.0000 mg | ORAL_TABLET | Freq: Two times a day (BID) | ORAL | 1 refills | Status: DC
  Filled 2022-09-02: qty 60, 60d supply, fill #0

## 2022-09-02 MED ORDER — LOSARTAN POTASSIUM 50 MG PO TABS
50.0000 mg | ORAL_TABLET | Freq: Every day | ORAL | 3 refills | Status: DC
  Filled 2022-09-02: qty 30, 30d supply, fill #0

## 2022-09-02 MED ORDER — ARIPIPRAZOLE 5 MG PO TABS
7.5000 mg | ORAL_TABLET | Freq: Every day | ORAL | 1 refills | Status: DC
  Filled 2022-09-02: qty 45, 30d supply, fill #0
  Filled 2022-11-27: qty 45, 30d supply, fill #1

## 2022-09-02 MED ORDER — TRAZODONE HCL 100 MG PO TABS
100.0000 mg | ORAL_TABLET | Freq: Every day | ORAL | 1 refills | Status: DC
  Filled 2022-09-02: qty 30, 30d supply, fill #0

## 2022-09-02 MED ORDER — TORSEMIDE 100 MG PO TABS
100.0000 mg | ORAL_TABLET | Freq: Every morning | ORAL | 3 refills | Status: DC
  Filled 2022-09-02 – 2022-11-27 (×2): qty 30, 30d supply, fill #0
  Filled 2022-11-27: qty 30, 30d supply, fill #1

## 2022-09-02 MED ORDER — GABAPENTIN 300 MG PO CAPS: 300.0000 mg | ORAL_CAPSULE | Freq: Two times a day (BID) | ORAL | 1 refills | Status: DC

## 2022-09-02 MED ORDER — ARIPIPRAZOLE 15 MG PO TABS
7.5000 mg | ORAL_TABLET | Freq: Every day | ORAL | 1 refills | Status: DC
  Filled 2022-09-02: qty 30, 60d supply, fill #0

## 2022-09-02 MED ORDER — METHOCARBAMOL 500 MG PO TABS
500.0000 mg | ORAL_TABLET | Freq: Three times a day (TID) | ORAL | 0 refills | Status: DC | PRN
  Filled 2022-09-02: qty 30, 10d supply, fill #0

## 2022-09-02 MED ORDER — CALCITRIOL 0.25 MCG PO CAPS
0.2500 ug | ORAL_CAPSULE | ORAL | 3 refills | Status: DC
  Filled 2022-09-02: qty 30, 70d supply, fill #0
  Filled 2022-11-27: qty 30, 70d supply, fill #1

## 2022-09-02 MED ORDER — SEVELAMER CARBONATE 800 MG PO TABS
1600.0000 mg | ORAL_TABLET | Freq: Three times a day (TID) | ORAL | 0 refills | Status: DC
  Filled 2022-09-02: qty 180, 30d supply, fill #0

## 2022-09-02 MED ORDER — GABAPENTIN 300 MG PO CAPS
300.0000 mg | ORAL_CAPSULE | Freq: Two times a day (BID) | ORAL | 1 refills | Status: DC
  Filled 2022-09-02: qty 60, 30d supply, fill #0

## 2022-09-02 MED ORDER — DICYCLOMINE HCL 20 MG PO TABS
20.0000 mg | ORAL_TABLET | Freq: Four times a day (QID) | ORAL | 0 refills | Status: DC | PRN
  Filled 2022-09-02: qty 30, 8d supply, fill #0

## 2022-09-02 MED ORDER — CALCIUM ACETATE (PHOS BINDER) 667 MG PO CAPS
667.0000 mg | ORAL_CAPSULE | Freq: Three times a day (TID) | ORAL | 3 refills | Status: DC
  Filled 2022-09-02: qty 30, 10d supply, fill #0
  Filled 2022-11-27: qty 30, 10d supply, fill #1
  Filled 2022-11-27: qty 30, 10d supply, fill #0

## 2022-09-02 NOTE — Progress Notes (Signed)
Pt to d/c today. Met with pt at bedside to review pt's out-pt HD schedule that was discussed last week. Pt has been accepted back at [facility]) on TTS. Pt can start tomorrow and will need to arrive at 12:00 to complete paperwork prior to 12:40 chair time. Pt provided an information sheet with this info noted. Pt agreeable to plan. Arrangements added to AVS as well. Pt requesting assistance with shelter placement and states she plans to use medicaid transportation. Notified CSW of pt's request for assistance. Contacted [facility] staff and spoke to [place] at clinic. Staff advised pt will d/c today and need to start tomorrow. NP contacted regarding clinic's need for initial orders. [name] current cell [phone].   Melven Sartorius Renal Navigator [phone]

## 2022-09-02 NOTE — Discharge Summary (Signed)
Physician Discharge Summary   Patient: Natasha Long MRN: [id number] DOB: 1996-06-11  Admit date:     08/30/2022  Discharge date: 09/02/22  Discharge Physician: Estill Cotta, MD    PCP: [name], MD   Recommendations at discharge:    Pt has been accepted back at [facility]) on TTS schedule. Pt can start tomorrow and will need to arrive at 12:00 to complete paperwork prior to 12:40 chair time.    Discharge Diagnoses:    Hypervolemia associated with renal insufficiency   Elevated troponin   ESRD on dialysis ([place])   Polysubstance abuse ([place])   Borderline personality disorder ([place])   Tobacco use   Back pain   Homelessness    Hospital Course:  Patient is a 26 year old female, currently homeless, used to live in [place] and then moved back to [place] with ESRD on HD, TTS, PTSD, HTN, neuropathy, borderline personality, history of polysubstance abuse, chronic pericardial effusion, recently admitted 9/24-9/30 with chest pain and was thought to be secondary to fluid overload.  Per cardiology, recommended volume management with HD, was treated empirically for pneumonia with Rocephin, doxycycline but left AMA on 9/30. Patient returned back with back and back pain in the setting of missed dialysis sessions.   Assessment and Plan:  ESRD on HD, noncompliant -Patient had to [place] but now back in [place], homeless -Presented with significant lower extremity edema although no respiratory issues.  She received hemodialysis on 10/2, then again on 10/3.  - Pt has been accepted back at [facility]) on TTS schedule. Pt can start tomorrow and will need to arrive at 12:00 to complete paperwork prior to 12:40 chair time.     Chest pain likely secondary to missed dialysis and volume overload -EF 45 to 50% 9/26+ chronic pericardial effusion -Elevated troponins likely due to ESRD -Currently no chest pain or acute shortness of breath  ?  Pneumonia -Patient received  vancomycin and cefepime in the ED -Patient had received 5 days of antibiotics during the previous admission. Currently no fevers, chest pain or shortness of breath, no evidence for infection at this time.  No further need for antibiotics  Chronic back pain -Continue Neurontin although decrease dose to 300 mg twice daily, Robaxin as needed  Polysubstance abuse, cocaine, marijuana, tobacco -Patient was placed on clonidine withdrawal protocol, currently stable  Hypertension -Continue torsemide, ARB  Anemia of chronic disease, ESRD -ESA per nephrology     Pain control - [facility] Controlled Substance Reporting System database was reviewed. and patient was instructed, not to drive, operate heavy machinery, perform activities at heights, swimming or participation in water activities or provide baby-sitting services while on Pain, Sleep and Anxiety Medications; until their outpatient Physician has advised to do so again. Also recommended to not to take more than prescribed Pain, Sleep and Anxiety Medications.  Consultants: Nephrology Procedures performed: HD  Disposition:  Shelter  Diet recommendation:  Discharge Diet Orders (From admission, onward)     Start     Ordered   09/02/22 0000  Diet - low sodium heart healthy        09/02/22 0953            DISCHARGE MEDICATION: Allergies as of 09/02/2022       Reactions   Prozac [fluoxetine Hcl] Anxiety, Other (See Comments)   Caused panic attacks   Wellbutrin [bupropion] Anxiety, Other (See Comments)   Caused panic attacks   Prednisone Other (See Comments)   Pt stated this med  caused pancreatitis        Medication List     STOP taking these medications    acetaminophen 500 MG tablet Commonly known as: TYLENOL   Darbepoetin Alfa 150 MCG/0.3ML Sosy injection Commonly known as: ARANESP   gabapentin 600 MG tablet Commonly known as: NEURONTIN Replaced by: gabapentin 300 MG capsule       TAKE these medications     ARIPiprazole 5 MG tablet Commonly known as: ABILIFY Take 1.5 tablets (7.5 mg total) by mouth daily. What changed: medication strength   b complex-vitamin c-folic acid 0.8 MG Tabs tablet Take 1 tablet by mouth daily.   calcitRIOL 0.25 MCG capsule Commonly known as: ROCALTROL Take 1 capsule (0.25 mcg total) by mouth Every Tuesday,Thursday,and Saturday with dialysis. Start taking on: September 03, 2022   calcium acetate 667 MG capsule Commonly known as: PHOSLO Take 1 capsule (667 mg total) by mouth 3 (three) times daily with meals.   dicyclomine 20 MG tablet Commonly known as: BENTYL Take 1 tablet (20 mg total) by mouth every 6 (six) hours as needed for spasms (abdominal cramping).   gabapentin 300 MG capsule Commonly known as: NEURONTIN Take 1 capsule (300 mg total) by mouth 2 (two) times daily. Replaces: gabapentin 600 MG tablet   hydrOXYzine 25 MG capsule Commonly known as: VISTARIL Take 1 capsule (25 mg total) by mouth every 8 (eight) hours as needed for anxiety.   lidocaine-prilocaine cream Commonly known as: EMLA Apply 1 application topically daily as needed (port site access on dialysis days (Tuesday's, Thursday's, and Saturday's).   losartan 50 MG tablet Commonly known as: COZAAR Take 1 tablet (50 mg total) by mouth daily.   methocarbamol 500 MG tablet Commonly known as: ROBAXIN Take 1 tablet (500 mg total) by mouth every 8 (eight) hours as needed for muscle spasms.   sevelamer carbonate 800 MG tablet Commonly known as: RENVELA Take 2 tablets (1,600 mg total) by mouth 3 (three) times daily with meals.   torsemide 100 MG tablet Commonly known as: DEMADEX Take 1 tablet (100 mg total) by mouth every morning.   traZODone 100 MG tablet Commonly known as: DESYREL Take 1 tablet (100 mg total) by mouth at bedtime.        [facility]. Go on 09/03/2022.   Why: Schedule is Tuesday/Thursday/Saturday with 12:40 chair time.   For first appointment, patient will need to arrive at 12:00 to complete paperwork prior to treatment. Contact information: [address] [place] [address] [phone]         [name], MD. Schedule an appointment as soon as possible for a visit in 2 week(s).   Specialty: Pulmonary Disease Why: for hospital follow-up Contact information: [address] [place] [address] [phone]         Belva Crome, MD .   Specialty: Cardiology Contact information: [phone] [place] [address] [phone]                Discharge Exam: Danley Danker Weights   09/01/22 0500 09/01/22 0840 09/01/22 1248  Weight: 85.5 kg 88.2 kg 84.6 kg   S: Doing well, no acute issues  Vitals:   09/01/22 1408 09/01/22 2044 09/02/22 0539 09/02/22 0949  BP: (!) 138/109 (!) 147/94 (!) 142/90 (!) 148/104  Pulse: (!) 102 (!) 105 92 95  Resp: 18 16  17   Temp: 98.2 F (36.8 C) 98.3 F (36.8 C) 98.9  F (37.2 C) 98.3 F (36.8 C)  TempSrc:   Oral Oral  SpO2: 96% 100% 97% 97%  Weight:      Height:         Physical Exam General: NAD, comfortable, alert and oriented Cardiovascular: S1 S2 clear, RRR.  Respiratory: CTAB, no wheezing, rales or rhonchi Gastrointestinal: Soft, nontender, nondistended, NBS Ext: no pedal edema bilaterally Psych: Normal affect and demeanor   Condition at discharge: fair  The results of significant diagnostics from this hospitalization (including imaging, microbiology, ancillary and laboratory) are listed below for reference.   Imaging Studies: DG CHEST PORT 1 VIEW  Result Date: 09/02/2022 CLINICAL DATA:  Pneumonia. EXAM: PORTABLE CHEST 1 VIEW COMPARISON:  08/30/2022 FINDINGS: 0450 hours. Lordotic positioning. Cardiopericardial silhouette is markedly enlarged. Interval increase in vascular congestion with progressive bibasilar collapse/consolidative opacity and small bilateral pleural effusions. Telemetry leads  overlie the chest. IMPRESSION: Markedly enlarged cardiopericardial silhouette. Pericardial effusion not excluded. Interval increase in vascular congestion with progressive bibasilar collapse/consolidative opacity and small bilateral pleural effusions. Electronically Signed   By: Misty Stanley M.D.   On: 09/02/2022 06:01   CT Renal Stone Study  Result Date: 08/31/2022 CLINICAL DATA:  Flank pain, kidney stone suspected. EXAM: CT ABDOMEN AND PELVIS WITHOUT CONTRAST TECHNIQUE: Multidetector CT imaging of the abdomen and pelvis was performed following the standard protocol without IV contrast. RADIATION DOSE REDUCTION: This exam was performed according to the departmental dose-optimization program which includes automated exposure control, adjustment of the mA and/or kV according to patient size and/or use of iterative reconstruction technique. COMPARISON:  CT renal stone protocol 03/18/2021 FINDINGS: Lower chest: Bilateral moderate pleural effusions with adjacent compressive atelectasis in the lower lobes. Calcified granuloma in the right middle lobe. Cardiomegaly. Calcification of the mitral valve. Hypodense blood pool. Hepatobiliary: No focal liver abnormality is seen. Status post cholecystectomy. No biliary dilatation. Pancreas: Unremarkable. No pancreatic ductal dilatation or surrounding inflammatory changes. Spleen: The spleen is borderline enlarged, measuring 13.6 cm in length. Adrenals/Urinary Tract: Adrenal glands are unremarkable. Diffuse cortical thinning of the kidneys. No hydronephrosis or nephrolithiasis. No perinephric fat stranding. Bladder is minimally distended and not well evaluated. No bladder stone. Stomach/Bowel: Stomach is within normal limits. Appendix appears normal. No evidence of bowel wall thickening, distention, or inflammatory changes. Vascular/Lymphatic: No significant vascular findings are present. No enlarged abdominal or pelvic lymph nodes. Reproductive: Uterus and bilateral adnexa  are unremarkable. Other: A small amount of ascites is noted in the right upper abdomen and in the posterior cul-de-sac. Diffuse anasarca. Musculoskeletal: No acute or suspicious osseous findings. Severe degenerative disc disease at L5-S1 vacuum disc phenomenon. Increased sclerosis with irregular joint endplates of the bilateral sacroiliac joints. IMPRESSION: 1. No nephrolithiasis or hydronephrosis as queried. Atrophic kidneys, consistent with medical renal disease. 2. Moderate bilateral pleural effusions, diffuse anasarca, and small amount of abdominopelvic ascites, consistent with fluid overload. 3. Bilateral symmetric sacroiliitis. 4. Borderline enlargement of the spleen. 5. Hypodense blood pool, suggestive of anemia. Electronically Signed   By: Ileana Roup M.D.   On: 08/31/2022 12:59   DG Chest 2 View  Result Date: 08/30/2022 CLINICAL DATA:  Chest pain EXAM: CHEST - 2 VIEW COMPARISON:  Chest x-ray 08/28/2022, chest x-ray 08/23/2022 FINDINGS: Similar-appearing cardiomegaly. The heart and mediastinal contours are within normal limits. Low lung volumes. Hazy airspace opacities of bilateral lower lung zones. No pulmonary edema. Bilateral trace to small volume pleural effusions with fluid noted extending within the minor and major fissures. No pneumothorax. No acute osseous abnormality. IMPRESSION:  1. Cardiomegaly with underlying pericardial effusion not excluded. 2. Bilateral trace to small volume pleural effusions. 3. Hazy airspace opacities of bilateral lower lung zones may represent a combination of atelectasis versus infection/inflammation. Electronically Signed   By: Iven Finn M.D.   On: 08/30/2022 23:56   DG CHEST PORT 1 VIEW  Result Date: 08/28/2022 CLINICAL DATA:  Dyspnea, cough EXAM: PORTABLE CHEST 1 VIEW COMPARISON:  Previous studies including the examination of 08/23/2022 FINDINGS: Transverse diameter of heart is increased. Central pulmonary vessels are prominent. Small to moderate bilateral  pleural effusions are seen, more so on the right side with interval increase. Evaluation of lower lung fields for infiltrates is limited by the effusions. There is no pneumothorax. IMPRESSION: Marked cardiomegaly. Possibility of pericardial effusion is not excluded. Small to moderate bilateral pleural effusions, more so on the right side with interval increase. Possibility of underlying atelectasis/pneumonia in the lower lung fields is not excluded. Electronically Signed   By: Elmer Picker M.D.   On: 08/28/2022 11:28   ECHOCARDIOGRAM COMPLETE  Result Date: 08/25/2022    ECHOCARDIOGRAM REPORT   Patient Name:   NIKOL LEMAR Date of Exam: 08/25/2022 Medical Rec #:  [id number]         Height:       68.0 in Accession #:    [id number]        Weight:       190.3 lb Date of Birth:  August 18, 1996         BSA:          2.001 m Patient Age:    26 years          BP:           149/96 mmHg Patient Gender: F                 HR:           106 bpm. Exam Location:  Inpatient Procedure: 2D Echo, 3D Echo, Cardiac Doppler and Color Doppler Indications:    Chest pain  History:        Patient has prior history of Echocardiogram examinations.                 Signs/Symptoms:Chest Pain; Risk Factors:Hypertension. CKD.                 Polysubstance abuse.  Sonographer:    Clayton Lefort RDCS (AE) Referring Phys: [id number] RONDELL A SMITH IMPRESSIONS  1. Left ventricular ejection fraction, by estimation, is 45 to 50%. Left ventricular ejection fraction by 3D volume is 44 %. The left ventricle has mildly decreased function. The left ventricle demonstrates global hypokinesis. The left ventricular internal cavity size was mildly dilated. There is severe concentric left ventricular hypertrophy. Left ventricular diastolic parameters are indeterminate.  2. Right ventricular systolic function is normal. The right ventricular size is moderately enlarged. There is severely elevated pulmonary artery systolic pressure.  3. Moderate pericardial  effusion. The pericardial effusion is circumferential. Large pleural effusion in the left lateral region.  4. The mitral valve is degenerative. Severe mitral valve regurgitation.  5. The tricuspid valve is abnormal. 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  6. The aortic valve is abnormal. There is mild calcification of the aortic valve. Aortic valve regurgitation is not visualized. Aortic valve mean gradient measures 13.0 mmHg. Comparison(s): LVEF similar to prior. Pericardial effusion is similar to prior. Valve disease has worsened. Limited repeat study when euvolemic would be reasonable. FINDINGS  Left Ventricle: Left ventricular ejection  fraction, by estimation, is 45 to 50%. Left ventricular ejection fraction by 3D volume is 44 %. The left ventricle has mildly decreased function. The left ventricle demonstrates global hypokinesis. The left ventricular internal cavity size was mildly dilated. There is severe concentric left ventricular hypertrophy. Left ventricular diastolic parameters are indeterminate. Right Ventricle: The right ventricular size is moderately enlarged. No increase in right ventricular wall thickness. Right ventricular systolic function is normal. There is severely elevated pulmonary artery systolic pressure. The tricuspid regurgitant velocity is 3.54 m/s, and with an assumed right atrial pressure of 15 mmHg, the estimated right ventricular systolic pressure is 41.2 mmHg. Left Atrium: Left atrial size was normal in size. Right Atrium: Right atrial size was normal in size. Pericardium: A moderately sized pericardial effusion is present. The pericardial effusion is circumferential. Mitral Valve: The mitral valve is degenerative in appearance. Severe mitral valve regurgitation. MV peak gradient, 15.8 mmHg. The mean mitral valve gradient is 7.0 mmHg with average heart rate of 105 bpm. Tricuspid Valve: The tricuspid valve is abnormal. 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 Aortic Valve: The aortic  valve is abnormal. There is mild calcification of the aortic valve. Aortic valve regurgitation is not visualized. Aortic valve mean gradient measures 13.0 mmHg. Aortic valve peak gradient measures 24.4 mmHg. Aortic valve area, by  VTI measures 1.91 cm. Pulmonic Valve: The pulmonic valve was normal in structure. Pulmonic valve regurgitation is not visualized. No evidence of pulmonic stenosis. Aorta: The aortic root and ascending aorta are structurally normal, with no evidence of dilitation. IAS/Shunts: No atrial level shunt detected by color flow Doppler. Additional Comments: There is a large pleural effusion in the left lateral region.  LEFT VENTRICLE PLAX 2D LVIDd:         5.40 cm         Diastology LVIDs:         4.10 cm         LV e' medial:    6.53 cm/s LV PW:         1.80 cm         LV E/e' medial:  25.9 LV IVS:        1.90 cm         LV e' lateral:   9.03 cm/s LVOT diam:     2.10 cm         LV E/e' lateral: 18.7 LV SV:         70 LV SV Index:   35 LVOT Area:     3.46 cm        3D Volume EF                                LV 3D EF:    Left                                             ventricul LV Volumes (MOD)                            ar LV vol d, MOD    154.0 ml                   ejection A4C:  fraction LV vol s, MOD    82.1 ml                    by 3D A4C:                                        volume is LV SV MOD A4C:   154.0 ml                   44 %.                                 3D Volume EF:                                3D EF:        44 %                                LV EDV:       262 ml                                LV ESV:       146 ml                                LV SV:        115 ml RIGHT VENTRICLE             IVC RV Basal diam:  4.90 cm     IVC diam: 2.30 cm RV Mid diam:    4.10 cm RV S prime:     17.40 cm/s TAPSE (M-mode): 2.0 cm LEFT ATRIUM             Index        RIGHT ATRIUM           Index LA diam:        3.50 cm 1.75 cm/m   RA Area:     20.00  cm LA Vol (A2C):   61.0 ml 30.49 ml/m  RA Volume:   56.80 ml  28.39 ml/m LA Vol (A4C):   58.1 ml 29.04 ml/m LA Biplane Vol: 62.1 ml 31.04 ml/m  AORTIC VALVE AV Area (Vmax):    1.92 cm AV Area (Vmean):   1.90 cm AV Area (VTI):     1.91 cm AV Vmax:           247.00 cm/s AV Vmean:          170.000 cm/s AV VTI:            0.368 m AV Peak Grad:      24.4 mmHg AV Mean Grad:      13.0 mmHg LVOT Vmax:         137.00 cm/s LVOT Vmean:        93.500 cm/s LVOT VTI:          0.203 m LVOT/AV VTI ratio: 0.55  AORTA Ao Root diam: 3.00 cm Ao Asc diam:  3.20 cm MITRAL VALVE  TRICUSPID VALVE MV Area (PHT): 6.48 cm     TR Peak grad:   50.1 mmHg MV Area VTI:   1.72 cm     TR Vmax:        354.00 cm/s MV Peak grad:  15.8 mmHg MV Mean grad:  7.0 mmHg     SHUNTS MV Vmax:       1.99 m/s     Systemic VTI:  0.20 m MV Vmean:      128.0 cm/s   Systemic Diam: 2.10 cm MV Decel Time: 117 msec MR Peak grad: 146.9 mmHg MR Mean grad: 89.0 mmHg MR Vmax:      606.00 cm/s MR Vmean:     435.0 cm/s MV E velocity: 169.00 cm/s MV A velocity: 137.00 cm/s MV E/A ratio:  1.23 Rudean Haskell MD Electronically signed by Rudean Haskell MD Signature Date/Time: 08/25/2022/5:44:51 PM    Final    DG Wrist 2 Views Left  Result Date: 08/24/2022 CLINICAL DATA:  Pain left wrist x2 weeks EXAM: LEFT WRIST - 2 VIEW COMPARISON:  None Available. FINDINGS: No recent displaced fracture or dislocation is seen. There is old healed fracture in the shaft of fifth metacarpal. Radiolucencies are seen in the lunate and scaphoid. There is narrowing of radiocarpal joint space. There are amorphous calcifications in the soft tissues along the medial and lateral aspect of the wrist and in the soft tissues between the first and second metacarpals. IMPRESSION: No recent displaced fracture or dislocation is seen. Radiolucencies are seen in lunate and scaphoid along with joint space narrowing in the radiocarpal joint. Findings may be due to degenerative  arthritis. Possibility of infectious process in the lunate and scaphoid is not excluded. Amorphous soft tissue calcifications are seen adjacent to the wrist and in the soft tissues between first and second metacarpals. This may suggest old soft tissue injury or residual changes from surgery. Electronically Signed   By: Elmer Picker M.D.   On: 08/24/2022 16:32   DG Chest 2 View  Result Date: 08/23/2022 CLINICAL DATA:  Chest pain EXAM: CHEST - 2 VIEW COMPARISON:  06/30/2022 FINDINGS: Lung volumes are small. Small bilateral pleural effusions are present. Right basilar peripheral opacity may represent a peripherally loculated pleural fluid component. Patchy atelectasis or infiltrate is seen at the left lung base. No pneumothorax. Mild cardiomegaly is stable. Perihilar interstitial pulmonary edema persists in keeping with mild cardiogenic failure. IMPRESSION: 1. Mild cardiogenic failure with small bilateral pleural effusions. 2. Left basilar atelectasis or infiltrate. 3. Peripheral right basilar opacity possibly representing a loculated pleural fluid component. Electronically Signed   By: Fidela Salisbury M.D.   On: 08/23/2022 21:46    Microbiology: Results for orders placed or performed during the hospital encounter of 08/30/22  Blood culture (routine x 2)     Status: None (Preliminary result)   Collection Time: 08/31/22  6:30 AM   Specimen: BLOOD  Result Value Ref Range Status   Specimen Description BLOOD RIGHT ANTECUBITAL  Final   Special Requests   Final    BOTTLES DRAWN AEROBIC AND ANAEROBIC Blood Culture adequate volume   Culture   Final    NO GROWTH 2 DAYS Performed at [facility], [place] [address] [place], [place] [address]    Report Status PENDING  Incomplete  Blood culture (routine x 2)     Status: None (Preliminary result)   Collection Time: 08/31/22  6:30 AM   Specimen: BLOOD  Result Value Ref Range Status   Specimen  Description BLOOD RIGHT ANTECUBITAL  Final   Special  Requests   Final    BOTTLES DRAWN AEROBIC AND ANAEROBIC Blood Culture results may not be optimal due to an inadequate volume of blood received in culture bottles   Culture   Final    NO GROWTH 2 DAYS Performed at [facility], [place] [address], [place], [place] [address]    Report Status PENDING  Incomplete    Labs: CBC: Recent Labs  Lab 08/30/22 2341 09/01/22 0917  WBC 13.9* 10.4  NEUTROABS 11.5* 8.1*  HGB 8.6* 7.5*  HCT 27.3* 23.5*  MCV 98.9 97.1  PLT 311 545   Basic Metabolic Panel: Recent Labs  Lab 08/30/22 2341 09/01/22 0916  NA 140 140  K 4.1 4.4  CL 101 102  CO2 20* 22  GLUCOSE 122* 94  BUN 106* 73*  CREATININE 12.92* 9.48*  CALCIUM 9.4 9.5  PHOS  --  7.6*   Liver Function Tests: Recent Labs  Lab 08/30/22 2341 09/01/22 0916  AST 24  --   ALT 28  --   ALKPHOS 64  --   BILITOT 0.6  --   PROT 6.9  --   ALBUMIN 2.8* 2.4*   CBG: No results for input(s): "GLUCAP" in the last 168 hours.  Discharge time spent: greater than 30 minutes.  Signed: Estill Cotta, MD [facility] 09/02/2022

## 2022-09-02 NOTE — Plan of Care (Signed)

## 2022-09-02 NOTE — TOC Initial Note (Signed)
Transition of Care [facility]) - Initial/Assessment Note    Patient Details  Name: Natasha Long MRN: [id number] Date of Birth: 10-Jul-1996  [facility] CM/SW Contact:    Milinda Antis, [place] Phone Number: 09/02/2022, 11:01 AM  Clinical Narrative:                 CSW met with the patient at bedside.  The patient reports that she has not  been back in [place] long.  Patient recently moved back to [place] from [place] and is homeless.    CSW contacted [facility] and they are able to accept the patient today.  CSW encouraged the patient to call Medicaid to schedule transportation from [facility] to her dialysis clinic.  CSW provided the patient with a voucher for transportation to the facility as the patient will need to be at the facility by 1300.    Expected Discharge Plan: Homeless Shelter Barriers to Discharge: Barriers Resolved   Patient Goals and CMS Choice     Choice offered to / list presented to : NA  Expected Discharge Plan and Services Expected Discharge Plan: Homeless Shelter In-house Referral: Clinical Social Work   Post Acute Care Choice: Dialysis Living arrangements for the past 2 months: Homeless Shelter Expected Discharge Date: 09/02/22                                    Prior Living Arrangements/Services Living arrangements for the past 2 months: [place] with:: Facility Resident Patient language and need for interpreter reviewed:: Yes Do you feel safe going back to the place where you live?: Yes      Need for Family Participation in Patient Care: No (Comment) Care giver support system in place?: No (comment)   Criminal Activity/Legal Involvement Pertinent to Current Situation/Hospitalization: No - Comment as needed  Activities of Daily Living      Permission Sought/Granted   Permission granted to share information with : Yes, Verbal Permission Granted     Permission granted to share info w  AGENCY: Shelters        Emotional Assessment Appearance:: Appears stated age Attitude/Demeanor/Rapport: Engaged Affect (typically observed): Appropriate Orientation: : Oriented to Place, Oriented to  Time, Oriented to Situation, Oriented to Self Alcohol / Substance Use: Not Applicable Psych Involvement: No (comment)  Admission diagnosis:  Flank pain [R10.9] ESRD on dialysis ([place]) [N18.6, Z99.2] Elevated troponin I level [R79.89] Sepsis due to pneumonia ([place]) [J18.9, A41.9] Patient Active Problem List   Diagnosis Date Noted   Back pain 08/31/2022   Homelessness 08/31/2022   Mitral valve insufficiency    Pleural effusion    Abnormal chest x-ray 08/24/2022   Heart failure with reduced ejection fraction ([place]) 08/24/2022   Acute on chronic systolic heart failure (HCC)    Volume overload 06/30/2022   Community acquired pneumonia 26/37/8588   Metabolic acidosis 50/27/7412   Acute kidney injury superimposed on CKD ([place]) 06/26/2022   Uremia of renal origin 05/15/2022   High risk sexual behavior 05/15/2022   Polysubstance abuse ([place]) 05/15/2022   Lip lesion 05/06/2022   Vaginal itching 05/06/2022   Left ankle pain 05/06/2022   Contamination of blood culture 05/06/2022   Uremia    Pericardial effusion without cardiac tamponade    Altered mental status 05/03/2022   Syphilis    Pain management    Septic arthritis of multiple joints ([place]) 02/01/2022   Group  G streptococcal infection 02/01/2022   Left wrist pain    Hyperkalemia 01/30/2022   Effusion of right knee    Arthritis, septic [facility])    Dialysis patient, noncompliant    Lower leg pain 01/29/2022   Hypokalemia 01/29/2022   Hyperphosphatemia 01/29/2022   Nonspecific chest pain    Hypervolemia associated with renal insufficiency 12/23/2021   Elevated troponin 12/23/2021   ESRD on dialysis ([place]) 12/04/2021   Tobacco use 08/11/2021   Nipple discharge 08/11/2021   Breast pain 08/11/2021   Exposure to sexually transmitted  disease (STD) 42/99/8069   Complication of vascular dialysis catheter 02/12/2021   Moderate protein-calorie malnutrition ([place]) 01/27/2021   Allergy, unspecified, initial encounter 01/23/2021   Anemia in chronic kidney disease 01/23/2021   End stage renal disease ([place]) 01/23/2021   Iron deficiency anemia, unspecified 01/23/2021   Other specified coagulation defects ([place]) 01/23/2021   Secondary hyperparathyroidism of renal origin ([place]) 01/23/2021   Anemia    Suicidal ideations    Influenza vaccine refused 12/19/2020   Morbid obesity ([place]) 01/30/2020   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   Cocaine use, 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[place]) 01/30/2020   Mood disorder ([place]) 06/27/2019   Borderline personality disorder ([place]) 11/16/2018   Moderate cannabis use disorder ([place]) 11/16/2018   Severe recurrent major depression without psychotic features ([place]) 11/15/2018   Chronic hypertension 04/04/2018   PTSD (post-traumatic stress disorder) 04/01/2018   PCP:  [name], MD Pharmacy:   [facility] [place], [place] - [address] [place] [address] Phone: [phone] Fax: [place] [address] [place] [address] Phone: [phone] Fax: [phone]     Social Determinants of Health (SDOH) Interventions    Readmission Risk Interventions    06/29/2022   11:17 AM  Readmission Risk Prevention Plan  Transportation Screening Complete  Medication Review (RN Care Manager) Complete  PCP or Specialist appointment within 3-5 days of discharge Complete  HRI or [place] Complete  SW Recovery Care/Counseling Consult Complete  [place] Not Applicable

## 2022-09-02 NOTE — Progress Notes (Signed)
Explained discharge instructions to patient. Reviewed follow up appointment and next medication administration times. Also reviewed CHF education. Patient verbalized having an understanding for instructions given. All belongings are in the patient's possession to include TOC meds. IV was removed by Roselyn Reef patient's RN. No other needs verbalized. Transported downstairs for discharge.

## 2022-09-03 ENCOUNTER — Telehealth: Payer: Self-pay

## 2022-09-03 DIAGNOSIS — N186 End stage renal disease: Secondary | ICD-10-CM

## 2022-09-03 DIAGNOSIS — Z992 Dependence on renal dialysis: Secondary | ICD-10-CM | POA: Insufficient documentation

## 2022-09-03 NOTE — Telephone Encounter (Signed)
Transition Care Management Unsuccessful Follow-up Telephone Call  Date of discharge and from where:  104/2023, [facility]  Attempts:  1st Attempt  Reason for unsuccessful TCM follow-up call:  Left voice message on [phone], call back requested

## 2022-09-04 NOTE — TOC Transition Note (Signed)
Transition of care contact from inpatient facility  Date of discharge: 09/02/22 Date of contact: 09/04/22 Method: Attempted phone call Spoke to: No Answer  Called patient to discuss transition of care from recent inpatient hospitalization but she did not pick up the phone. A voicemail was left for her to reach back out to [facility] for any questions or concerns.  Doesn't appear she showed up for scheduled HD on 09/03/22. Next HD on 09/05/22.  Tobie Poet, NP

## 2022-09-05 LAB — CULTURE, BLOOD (ROUTINE X 2)
Culture: NO GROWTH
Culture: NO GROWTH
Special Requests: ADEQUATE

## 2022-09-07 ENCOUNTER — Telehealth: Payer: Self-pay

## 2022-09-07 NOTE — Telephone Encounter (Signed)
Transition Care Management Unsuccessful Follow-up Telephone Call  Date of discharge and from where:  09/02/2022, [facility]  Attempts:  2nd Attempt  Reason for unsuccessful TCM follow-up call:  Left voice message on [phone], call back requested

## 2022-09-08 ENCOUNTER — Telehealth: Payer: Self-pay

## 2022-09-08 NOTE — Telephone Encounter (Signed)
Transition Care Management Unsuccessful Follow-up Telephone Call  Date of discharge and from where:  09/02/2022, [facility]  Attempts:  3rd Attempt  Reason for unsuccessful TCM follow-up call:  Left voice message  on [phone] , call back requested.  I also called [phone] and the voicemail was not set up.   Letter also sent to patient requesting she contact [facility] to schedule a follow up appointment as we have not been able to reach her

## 2022-09-17 ENCOUNTER — Other Ambulatory Visit: Payer: Self-pay

## 2022-09-17 ENCOUNTER — Emergency Department (HOSPITAL_COMMUNITY): Payer: Medicaid Other

## 2022-09-17 ENCOUNTER — Emergency Department (HOSPITAL_COMMUNITY)
Admission: EM | Admit: 2022-09-17 | Discharge: 2022-09-17 | Disposition: A | Discharge status: Home / Self Care | Payer: Medicaid Other | Attending: Student | Admitting: Student

## 2022-09-17 ENCOUNTER — Encounter (HOSPITAL_COMMUNITY): Payer: Self-pay

## 2022-09-17 DIAGNOSIS — R079 Chest pain, unspecified: Secondary | ICD-10-CM | POA: Diagnosis present

## 2022-09-17 DIAGNOSIS — I10 Essential (primary) hypertension: Secondary | ICD-10-CM | POA: Diagnosis not present

## 2022-09-17 DIAGNOSIS — Z5321 Procedure and treatment not carried out due to patient leaving prior to being seen by health care provider: Secondary | ICD-10-CM | POA: Diagnosis not present

## 2022-09-17 DIAGNOSIS — R0602 Shortness of breath: Secondary | ICD-10-CM | POA: Diagnosis not present

## 2022-09-17 LAB — CBC WITH DIFFERENTIAL/PLATELET
Abs Immature Granulocytes: 0.03 10*3/uL (ref 0.00–0.07)
Basophils Absolute: 0 10*3/uL (ref 0.0–0.1)
Basophils Relative: 0 %
Eosinophils Absolute: 0.2 10*3/uL (ref 0.0–0.5)
Eosinophils Relative: 2 %
HCT: 26.1 % — ABNORMAL LOW (ref 36.0–46.0)
Hemoglobin: 8.3 g/dL — ABNORMAL LOW (ref 12.0–15.0)
Immature Granulocytes: 0 %
Lymphocytes Relative: 15 %
Lymphs Abs: 1.5 10*3/uL (ref 0.7–4.0)
MCH: 31.3 pg (ref 26.0–34.0)
MCHC: 31.8 g/dL (ref 30.0–36.0)
MCV: 98.5 fL (ref 80.0–100.0)
Monocytes Absolute: 0.9 10*3/uL (ref 0.1–1.0)
Monocytes Relative: 9 %
Neutro Abs: 7.2 10*3/uL (ref 1.7–7.7)
Neutrophils Relative %: 74 %
Platelets: 316 10*3/uL (ref 150–400)
RBC: 2.65 MIL/uL — ABNORMAL LOW (ref 3.87–5.11)
RDW: 16.8 % — ABNORMAL HIGH (ref 11.5–15.5)
WBC: 9.9 10*3/uL (ref 4.0–10.5)
nRBC: 0 % (ref 0.0–0.2)

## 2022-09-17 LAB — BASIC METABOLIC PANEL
Anion gap: 12 (ref 5–15)
BUN: 17 mg/dL (ref 6–20)
CO2: 28 mmol/L (ref 22–32)
Calcium: 9.8 mg/dL (ref 8.9–10.3)
Chloride: 98 mmol/L (ref 98–111)
Creatinine, Ser: 4.46 mg/dL — ABNORMAL HIGH (ref 0.44–1.00)
GFR, Estimated: 13 mL/min — ABNORMAL LOW (ref >60)
Glucose, Bld: 104 mg/dL — ABNORMAL HIGH (ref 70–99)
Potassium: 4 mmol/L (ref 3.5–5.1)
Sodium: 138 mmol/L (ref 135–145)

## 2022-09-17 LAB — TROPONIN I (HIGH SENSITIVITY)
Troponin I (High Sensitivity): 59 ng/L — ABNORMAL HIGH (ref <18)
Troponin I (High Sensitivity): 61 ng/L — ABNORMAL HIGH (ref <18)

## 2022-09-17 LAB — BRAIN NATRIURETIC PEPTIDE: B Natriuretic Peptide: 2032.4 pg/mL — ABNORMAL HIGH (ref 0.0–100.0)

## 2022-09-17 LAB — MAGNESIUM: Magnesium: 2 mg/dL (ref 1.7–2.4)

## 2022-09-17 NOTE — ED Notes (Signed)
Pt. Given a sandwich and drink

## 2022-09-17 NOTE — ED Provider Triage Note (Signed)
Emergency Medicine Provider Triage Evaluation Note  Natasha Long , a 26 y.o. female  was evaluated in triage.  Pt complains of chest pain left-sided going on for last month, worse today, states that she was at dialysis Tuesday Thursday Saturday, had her last dialysis treatment yesterday, states that she is homeless and has difficulty getting to and from dialysis, states that she was using discharge from the hospital for possible blood infection.  She having no other complaints.  Reviewed her chart was recent discharge from [facility] yesterday, she had emergent dialysis on the 16th and then had dialysis on yesterday, she has chronic anemia from chronic diseases, hemoglobin upon discharge was 7.1 after 1 unit of blood given, she did have slightly elevated troponins at that time they attributed to hypertension as well as keep pulmonary edema from volume overload..  Review of Systems  Positive: Chest pain, shortness of breath Negative: CP nausea vomiting  Physical Exam  There were no vitals taken for this visit. Gen:   Awake, no distress   Resp:  Normal effort  MSK:   Moves extremities without difficulty  Other:    Medical Decision Making  Medically screening exam initiated at 1:36 AM.  Appropriate orders placed.  Dariel Surrette was informed that the remainder of the evaluation will be completed by another provider, this initial triage assessment does not replace that evaluation, and the importance of remaining in the ED until their evaluation is complete.  Lab work imaging ordered will need further work-up.   Marcello Fennel, PA-C 09/17/22 [phone]

## 2022-09-17 NOTE — ED Triage Notes (Signed)
Left sided chest pains x 1 month. Reports recently being admitted to [facility] for several days to get dialysis. Usually goes T, TH, Sat when able. Sts she is homeless and was struggling with transportation.

## 2022-09-29 ENCOUNTER — Other Ambulatory Visit: Payer: Self-pay

## 2022-09-29 ENCOUNTER — Emergency Department (HOSPITAL_COMMUNITY): Payer: Medicaid Other

## 2022-09-29 ENCOUNTER — Observation Stay (HOSPITAL_COMMUNITY): Payer: Medicaid Other

## 2022-09-29 ENCOUNTER — Encounter (HOSPITAL_COMMUNITY): Payer: Self-pay

## 2022-09-29 ENCOUNTER — Inpatient Hospital Stay (HOSPITAL_COMMUNITY)
Admission: EM | Admit: 2022-09-29 | Discharge: 2022-10-02 | DRG: 640 | Discharge status: Against Medical Advice | Payer: Medicaid Other | Attending: Internal Medicine | Admitting: Internal Medicine

## 2022-09-29 DIAGNOSIS — I5020 Unspecified systolic (congestive) heart failure: Secondary | ICD-10-CM | POA: Diagnosis present

## 2022-09-29 DIAGNOSIS — F32A Depression, unspecified: Secondary | ICD-10-CM | POA: Diagnosis present

## 2022-09-29 DIAGNOSIS — F4310 Post-traumatic stress disorder, unspecified: Secondary | ICD-10-CM | POA: Diagnosis present

## 2022-09-29 DIAGNOSIS — R0902 Hypoxemia: Secondary | ICD-10-CM | POA: Diagnosis present

## 2022-09-29 DIAGNOSIS — Z833 Family history of diabetes mellitus: Secondary | ICD-10-CM

## 2022-09-29 DIAGNOSIS — Z8249 Family history of ischemic heart disease and other diseases of the circulatory system: Secondary | ICD-10-CM

## 2022-09-29 DIAGNOSIS — D72829 Elevated white blood cell count, unspecified: Secondary | ICD-10-CM | POA: Diagnosis present

## 2022-09-29 DIAGNOSIS — N186 End stage renal disease: Secondary | ICD-10-CM

## 2022-09-29 DIAGNOSIS — J45909 Unspecified asthma, uncomplicated: Secondary | ICD-10-CM | POA: Diagnosis present

## 2022-09-29 DIAGNOSIS — R079 Chest pain, unspecified: Secondary | ICD-10-CM | POA: Diagnosis present

## 2022-09-29 DIAGNOSIS — G928 Other toxic encephalopathy: Secondary | ICD-10-CM | POA: Diagnosis present

## 2022-09-29 DIAGNOSIS — F1910 Other psychoactive substance abuse, uncomplicated: Secondary | ICD-10-CM | POA: Diagnosis present

## 2022-09-29 DIAGNOSIS — E162 Hypoglycemia, unspecified: Secondary | ICD-10-CM | POA: Diagnosis present

## 2022-09-29 DIAGNOSIS — F1220 Cannabis dependence, uncomplicated: Secondary | ICD-10-CM | POA: Diagnosis present

## 2022-09-29 DIAGNOSIS — Z5329 Procedure and treatment not carried out because of patient's decision for other reasons: Secondary | ICD-10-CM | POA: Diagnosis not present

## 2022-09-29 DIAGNOSIS — I3139 Other pericardial effusion (noninflammatory): Secondary | ICD-10-CM | POA: Diagnosis present

## 2022-09-29 DIAGNOSIS — I5022 Chronic systolic (congestive) heart failure: Secondary | ICD-10-CM | POA: Diagnosis present

## 2022-09-29 DIAGNOSIS — Z5900 Homelessness unspecified: Secondary | ICD-10-CM

## 2022-09-29 DIAGNOSIS — E875 Hyperkalemia: Principal | ICD-10-CM | POA: Diagnosis present

## 2022-09-29 DIAGNOSIS — R000 Tachycardia, unspecified: Secondary | ICD-10-CM | POA: Diagnosis present

## 2022-09-29 DIAGNOSIS — Z91158 Patient's noncompliance with renal dialysis for other reason: Secondary | ICD-10-CM

## 2022-09-29 DIAGNOSIS — Z888 Allergy status to other drugs, medicaments and biological substances status: Secondary | ICD-10-CM

## 2022-09-29 DIAGNOSIS — G9341 Metabolic encephalopathy: Secondary | ICD-10-CM | POA: Diagnosis not present

## 2022-09-29 DIAGNOSIS — I10 Essential (primary) hypertension: Secondary | ICD-10-CM | POA: Diagnosis present

## 2022-09-29 DIAGNOSIS — Z79899 Other long term (current) drug therapy: Secondary | ICD-10-CM

## 2022-09-29 DIAGNOSIS — R451 Restlessness and agitation: Secondary | ICD-10-CM | POA: Diagnosis present

## 2022-09-29 DIAGNOSIS — G629 Polyneuropathy, unspecified: Secondary | ICD-10-CM | POA: Diagnosis present

## 2022-09-29 DIAGNOSIS — F1410 Cocaine abuse, uncomplicated: Secondary | ICD-10-CM | POA: Diagnosis present

## 2022-09-29 DIAGNOSIS — E8770 Fluid overload, unspecified: Secondary | ICD-10-CM | POA: Diagnosis present

## 2022-09-29 DIAGNOSIS — Z992 Dependence on renal dialysis: Secondary | ICD-10-CM

## 2022-09-29 DIAGNOSIS — F603 Borderline personality disorder: Secondary | ICD-10-CM | POA: Diagnosis present

## 2022-09-29 DIAGNOSIS — I132 Hypertensive heart and chronic kidney disease with heart failure and with stage 5 chronic kidney disease, or end stage renal disease: Secondary | ICD-10-CM | POA: Diagnosis present

## 2022-09-29 DIAGNOSIS — G4700 Insomnia, unspecified: Secondary | ICD-10-CM | POA: Diagnosis present

## 2022-09-29 HISTORY — DX: Chest pain, unspecified: R07.9

## 2022-09-29 LAB — COMPREHENSIVE METABOLIC PANEL
ALT: 57 U/L — ABNORMAL HIGH (ref 0–44)
AST: 84 U/L — ABNORMAL HIGH (ref 15–41)
Albumin: 3.5 g/dL (ref 3.5–5.0)
Alkaline Phosphatase: 94 U/L (ref 38–126)
Anion gap: 28 — ABNORMAL HIGH (ref 5–15)
BUN: 176 mg/dL — ABNORMAL HIGH (ref 6–20)
CO2: 13 mmol/L — ABNORMAL LOW (ref 22–32)
Calcium: 10.5 mg/dL — ABNORMAL HIGH (ref 8.9–10.3)
Chloride: 97 mmol/L — ABNORMAL LOW (ref 98–111)
Creatinine, Ser: 20.62 mg/dL — ABNORMAL HIGH (ref 0.44–1.00)
GFR, Estimated: 2 mL/min — ABNORMAL LOW (ref >60)
Glucose, Bld: 71 mg/dL (ref 70–99)
Potassium: >7.5 mmol/L (ref 3.5–5.1)
Sodium: 138 mmol/L (ref 135–145)
Total Bilirubin: 1 mg/dL (ref 0.3–1.2)
Total Protein: 8 g/dL (ref 6.5–8.1)

## 2022-09-29 LAB — BASIC METABOLIC PANEL
Anion gap: 22 — ABNORMAL HIGH (ref 5–15)
Anion gap: 23 — ABNORMAL HIGH (ref 5–15)
BUN: 115 mg/dL — ABNORMAL HIGH (ref 6–20)
BUN: 95 mg/dL — ABNORMAL HIGH (ref 6–20)
CO2: 19 mmol/L — ABNORMAL LOW (ref 22–32)
CO2: 18 mmol/L — ABNORMAL LOW (ref 22–32)
Calcium: 9.7 mg/dL (ref 8.9–10.3)
Calcium: 9.5 mg/dL (ref 8.9–10.3)
Chloride: 97 mmol/L — ABNORMAL LOW (ref 98–111)
Chloride: 98 mmol/L (ref 98–111)
Creatinine, Ser: 13.34 mg/dL — ABNORMAL HIGH (ref 0.44–1.00)
Creatinine, Ser: 12.04 mg/dL — ABNORMAL HIGH (ref 0.44–1.00)
GFR, Estimated: 4 mL/min — ABNORMAL LOW (ref >60)
GFR, Estimated: 4 mL/min — ABNORMAL LOW (ref >60)
Glucose, Bld: 79 mg/dL (ref 70–99)
Glucose, Bld: 57 mg/dL — ABNORMAL LOW (ref 70–99)
Potassium: 5.3 mmol/L — ABNORMAL HIGH (ref 3.5–5.1)
Potassium: 4.7 mmol/L (ref 3.5–5.1)
Sodium: 138 mmol/L (ref 135–145)
Sodium: 139 mmol/L (ref 135–145)

## 2022-09-29 LAB — I-STAT VENOUS BLOOD GAS, ED
Acid-base deficit: 1 mmol/L (ref 0.0–2.0)
Bicarbonate: 20.9 mmol/L (ref 20.0–28.0)
Calcium, Ion: 1.01 mmol/L — ABNORMAL LOW (ref 1.15–1.40)
HCT: 26 % — ABNORMAL LOW (ref 36.0–46.0)
Hemoglobin: 8.8 g/dL — ABNORMAL LOW (ref 12.0–15.0)
O2 Saturation: 99 %
Potassium: 4.6 mmol/L (ref 3.5–5.1)
Sodium: 133 mmol/L — ABNORMAL LOW (ref 135–145)
TCO2: 22 mmol/L (ref 22–32)
pCO2, Ven: 24.2 mmHg — ABNORMAL LOW (ref 44–60)
pH, Ven: 7.544 — ABNORMAL HIGH (ref 7.25–7.43)
pO2, Ven: 109 mmHg — ABNORMAL HIGH (ref 32–45)

## 2022-09-29 LAB — CBC WITH DIFFERENTIAL/PLATELET
Abs Immature Granulocytes: 0.08 10*3/uL — ABNORMAL HIGH (ref 0.00–0.07)
Basophils Absolute: 0 10*3/uL (ref 0.0–0.1)
Basophils Relative: 0 %
Eosinophils Absolute: 0 10*3/uL (ref 0.0–0.5)
Eosinophils Relative: 0 %
HCT: 29 % — ABNORMAL LOW (ref 36.0–46.0)
Hemoglobin: 8.9 g/dL — ABNORMAL LOW (ref 12.0–15.0)
Immature Granulocytes: 1 %
Lymphocytes Relative: 10 %
Lymphs Abs: 1.4 10*3/uL (ref 0.7–4.0)
MCH: 31 pg (ref 26.0–34.0)
MCHC: 30.7 g/dL (ref 30.0–36.0)
MCV: 101 fL — ABNORMAL HIGH (ref 80.0–100.0)
Monocytes Absolute: 0.8 10*3/uL (ref 0.1–1.0)
Monocytes Relative: 6 %
Neutro Abs: 11.1 10*3/uL — ABNORMAL HIGH (ref 1.7–7.7)
Neutrophils Relative %: 83 %
Platelets: 289 10*3/uL (ref 150–400)
RBC: 2.87 MIL/uL — ABNORMAL LOW (ref 3.87–5.11)
RDW: 19.9 % — ABNORMAL HIGH (ref 11.5–15.5)
WBC: 13.4 10*3/uL — ABNORMAL HIGH (ref 4.0–10.5)
nRBC: 0 % (ref 0.0–0.2)

## 2022-09-29 LAB — CBG MONITORING, ED
Glucose-Capillary: 45 mg/dL — ABNORMAL LOW (ref 70–99)
Glucose-Capillary: 65 mg/dL — ABNORMAL LOW (ref 70–99)
Glucose-Capillary: 92 mg/dL (ref 70–99)

## 2022-09-29 LAB — HEPATITIS B SURFACE ANTIGEN: Hepatitis B Surface Ag: NONREACTIVE

## 2022-09-29 MED ORDER — INSULIN ASPART 100 UNIT/ML IV SOLN
5.0000 [IU] | Freq: Once | INTRAVENOUS | Status: AC
  Administered 2022-09-29: 5 [IU] via INTRAVENOUS

## 2022-09-29 MED ORDER — CALCITRIOL 0.25 MCG PO CAPS
0.2500 ug | ORAL_CAPSULE | ORAL | Status: DC
  Administered 2022-10-01: 0.25 ug via ORAL
  Filled 2022-09-29: qty 1

## 2022-09-29 MED ORDER — LIDOCAINE HCL (PF) 1 % IJ SOLN: 5.0000 mL | INTRAMUSCULAR | Status: DC | PRN

## 2022-09-29 MED ORDER — PENTAFLUOROPROP-TETRAFLUOROETH EX AERO
1.0000 | INHALATION_SPRAY | CUTANEOUS | Status: DC | PRN
  Filled 2022-09-29: qty 116

## 2022-09-29 MED ORDER — ALTEPLASE 2 MG IJ SOLR: 2.0000 mg | Freq: Once | INTRAMUSCULAR | Status: DC | PRN

## 2022-09-29 MED ORDER — DICYCLOMINE HCL 20 MG PO TABS: 20.0000 mg | ORAL_TABLET | Freq: Four times a day (QID) | ORAL | Status: DC | PRN

## 2022-09-29 MED ORDER — SODIUM CHLORIDE 0.9% FLUSH
3.0000 mL | Freq: Two times a day (BID) | INTRAVENOUS | Status: DC
  Administered 2022-09-29 – 2022-10-02 (×5): 3 mL via INTRAVENOUS

## 2022-09-29 MED ORDER — LORAZEPAM 2 MG/ML IJ SOLN
INTRAMUSCULAR | Status: AC
  Administered 2022-09-29: 0.5 mg via INTRAVENOUS
  Filled 2022-09-29: qty 1

## 2022-09-29 MED ORDER — CHLORHEXIDINE GLUCONATE CLOTH 2 % EX PADS: 6.0000 | MEDICATED_PAD | Freq: Every day | CUTANEOUS | Status: DC

## 2022-09-29 MED ORDER — TRAZODONE HCL 100 MG PO TABS
100.0000 mg | ORAL_TABLET | Freq: Every day | ORAL | Status: DC
  Administered 2022-09-29 – 2022-09-30 (×2): 100 mg via ORAL
  Filled 2022-09-29 (×2): qty 1

## 2022-09-29 MED ORDER — LORAZEPAM 2 MG/ML IJ SOLN: 0.5000 mg | Freq: Once | INTRAMUSCULAR | Status: AC

## 2022-09-29 MED ORDER — LIDOCAINE-PRILOCAINE 2.5-2.5 % EX CREA
1.0000 | TOPICAL_CREAM | CUTANEOUS | Status: DC | PRN
  Filled 2022-09-29: qty 5

## 2022-09-29 MED ORDER — DEXTROSE 50 % IV SOLN
1.0000 | Freq: Once | INTRAVENOUS | Status: AC
  Administered 2022-09-29: 50 mL via INTRAVENOUS
  Filled 2022-09-29: qty 50

## 2022-09-29 MED ORDER — NALOXONE HCL 0.4 MG/ML IJ SOLN: 0.4000 mg | INTRAMUSCULAR | Status: DC | PRN

## 2022-09-29 MED ORDER — TORSEMIDE 20 MG PO TABS
100.0000 mg | ORAL_TABLET | Freq: Every morning | ORAL | Status: DC
  Administered 2022-09-29 – 2022-10-02 (×3): 100 mg via ORAL
  Filled 2022-09-29 (×3): qty 5

## 2022-09-29 MED ORDER — ANTICOAGULANT SODIUM CITRATE 4% (200MG/5ML) IV SOLN
5.0000 mL | Status: DC | PRN
  Filled 2022-09-29: qty 5

## 2022-09-29 MED ORDER — DEXTROSE 50 % IV SOLN
25.0000 g | INTRAVENOUS | Status: DC | PRN
  Administered 2022-09-29: 25 g via INTRAVENOUS
  Filled 2022-09-29: qty 50

## 2022-09-29 MED ORDER — SODIUM CHLORIDE 0.9% FLUSH: 3.0000 mL | INTRAVENOUS | Status: DC | PRN

## 2022-09-29 MED ORDER — METHOCARBAMOL 500 MG PO TABS: 500.0000 mg | ORAL_TABLET | Freq: Three times a day (TID) | ORAL | Status: DC | PRN

## 2022-09-29 MED ORDER — LOSARTAN POTASSIUM 50 MG PO TABS
50.0000 mg | ORAL_TABLET | Freq: Every day | ORAL | Status: DC
  Administered 2022-10-01 – 2022-10-02 (×2): 50 mg via ORAL
  Filled 2022-09-29 (×2): qty 1

## 2022-09-29 MED ORDER — CALCIUM GLUCONATE 10 % IV SOLN
1.0000 g | Freq: Once | INTRAVENOUS | Status: AC
  Administered 2022-09-29: 1 g via INTRAVENOUS
  Filled 2022-09-29: qty 10

## 2022-09-29 MED ORDER — SEVELAMER CARBONATE 800 MG PO TABS
1600.0000 mg | ORAL_TABLET | Freq: Three times a day (TID) | ORAL | Status: DC
  Administered 2022-10-01 – 2022-10-02 (×4): 1600 mg via ORAL
  Filled 2022-09-29 (×3): qty 2

## 2022-09-29 MED ORDER — SODIUM CHLORIDE 0.9 % IV SOLN: 250.0000 mL | INTRAVENOUS | Status: DC | PRN

## 2022-09-29 MED ORDER — GABAPENTIN 300 MG PO CAPS
300.0000 mg | ORAL_CAPSULE | Freq: Two times a day (BID) | ORAL | Status: DC
  Administered 2022-09-29 – 2022-09-30 (×2): 300 mg via ORAL
  Filled 2022-09-29 (×2): qty 1

## 2022-09-29 MED ORDER — CALCIUM ACETATE (PHOS BINDER) 667 MG PO CAPS
667.0000 mg | ORAL_CAPSULE | Freq: Three times a day (TID) | ORAL | Status: DC
  Administered 2022-10-01 – 2022-10-02 (×4): 667 mg via ORAL
  Filled 2022-09-29 (×4): qty 1

## 2022-09-29 MED ORDER — ARIPIPRAZOLE 5 MG PO TABS
7.5000 mg | ORAL_TABLET | Freq: Every day | ORAL | Status: DC
  Administered 2022-10-01 – 2022-10-02 (×2): 7.5 mg via ORAL
  Filled 2022-09-29 (×4): qty 2

## 2022-09-29 MED ORDER — HYDROXYZINE HCL 10 MG PO TABS: 25.0000 mg | ORAL_TABLET | Freq: Three times a day (TID) | ORAL | Status: DC | PRN

## 2022-09-29 MED ORDER — ALBUTEROL SULFATE (2.5 MG/3ML) 0.083% IN NEBU
10.0000 mg | INHALATION_SOLUTION | Freq: Once | RESPIRATORY_TRACT | Status: AC
  Administered 2022-09-29: 10 mg via RESPIRATORY_TRACT
  Filled 2022-09-29: qty 12

## 2022-09-29 MED ORDER — HEPARIN SODIUM (PORCINE) 1000 UNIT/ML DIALYSIS
1000.0000 [IU] | INTRAMUSCULAR | Status: DC | PRN
  Filled 2022-09-29: qty 1

## 2022-09-29 MED ORDER — HEPARIN SODIUM (PORCINE) 5000 UNIT/ML IJ SOLN
5000.0000 [IU] | Freq: Three times a day (TID) | INTRAMUSCULAR | Status: DC
  Administered 2022-09-29 – 2022-10-01 (×5): 5000 [IU] via SUBCUTANEOUS
  Filled 2022-09-29 (×5): qty 1

## 2022-09-29 NOTE — Assessment & Plan Note (Signed)
Initial K >7.5. After HD 4.6  Plan Gentle hydration - 1/2 NS at 50 cc/ hr  F/u Bmet in AM

## 2022-09-29 NOTE — Progress Notes (Signed)
CSW spoke with Olivia Mackie, renal navigator to inform her of patient's presence in the ED.  Madilyn Fireman, MSW, LCSW Transitions of Care  Clinical Social Worker II [phone]

## 2022-09-29 NOTE — Progress Notes (Addendum)
Aware pt currently in the ED. Pt was clipped to [facility]) on her last admission but pt never showed up for treatment. Clinic has  d/c pt clinic census therefore pt will have to be re-clipped for out-pt HD clinic placement. Attempted to reach pt via cell phone number provided last admission but unable to speak to pt or leave a message. Will attempt to speak with pt again tomorrow.   Melven Sartorius Renal Navigator [phone]  Addendum at 6:20 pm: Referral submitted to [facility] admissions for review.

## 2022-09-29 NOTE — ED Notes (Signed)
Taken to dialysis by transporter. Report given to [place] in HD.

## 2022-09-29 NOTE — Progress Notes (Addendum)
Renal MD signed an emergent HD tx  consent for her.Her blood K level is 7.4.Patient is very lethargic .No contact person listed on her chart.

## 2022-09-29 NOTE — ED Notes (Signed)
Holding meds until pt becomes more alert.Marland KitchenMarland KitchenMarland Kitchen

## 2022-09-29 NOTE — ED Provider Triage Note (Signed)
  Emergency Medicine Provider Triage Evaluation Note  MRN:  [id number]  Arrival date & time: 09/29/22    Medically screening exam initiated at 6:15 AM.   CC:   Missed Dialysis  HPI:  Natasha Long is a 26 y.o. year-old female presents to the ED with chief complaint of missing dialysis for 4 weeks.  States she's afraid she is in "toxic shock."  States that she feels SOB.  History provided by patient. ROS:  -As included in HPI PE:   Vitals:   09/29/22 0607  BP: (!) 153/121  Pulse: (!) 110  Resp: 18  Temp: 97.7 F (36.5 C)  SpO2: (!) 89%    Non-toxic appearing No respiratory distress  MDM:    Patient was informed that the remainder of the evaluation will be completed by another provider, this initial triage assessment does not replace that evaluation, and the importance of remaining in the ED until their evaluation is complete.    Montine Circle, PA-C 09/29/22 [phone]

## 2022-09-29 NOTE — Progress Notes (Addendum)
Pt refused CBG and she refused to answer admission questions
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
## 2022-09-29 NOTE — ED Provider Notes (Signed)
[facility] EMERGENCY DEPARTMENT Provider Note  CSN: [id number] Arrival date & time: 09/29/22 9518  Chief Complaint(s) hyperkalemia  HPI Natasha Long is a 26 y.o. female with PMH ESRD on hemodialysis Monday Wednesday Friday, polysubstance abuse, pericardial effusion who presents emergency department for evaluation of multiple complaints including nausea, jitteriness, shortness of breath.  She states that she has not had dialysis since she was last seen in the emergency department on 08/31/2022.  Patient states that she did use crack cocaine prior to arrival and does appear to be intoxicated on arrival.  She arrives tachycardic, hypertensive, agitated.  Denies abdominal pain, diarrhea, headache, fever or other systemic symptoms.   Past Medical History Past Medical History:  Diagnosis Date   Asthma    as a child, no problem as an adult, no inhaler   Complication of anesthesia    woke up before tube removed, 1 time fought nurses   ESRD on hemodialysis [facility])    M-W-F   History of borderline personality disorder    Hypertension    diagnosed as child; stopped meds at 19 yo   Insomnia    Neuromuscular disorder ([place])    peripheral neuropathy   PTSD (post-traumatic stress disorder)    Patient Active Problem List   Diagnosis Date Noted   Back pain 08/31/2022   Homelessness 08/31/2022   Mitral valve insufficiency    Pleural effusion    Abnormal chest x-ray 08/24/2022   Heart failure with reduced ejection fraction ([place]) 08/24/2022   Acute on chronic systolic heart failure (HCC)    Volume overload 06/30/2022   Community acquired pneumonia 84/16/6063   Metabolic acidosis 01/60/1093   Acute kidney injury superimposed on CKD ([place]) 06/26/2022   Uremia of renal origin 05/15/2022   High risk sexual behavior 05/15/2022   Polysubstance abuse ([place]) 05/15/2022   Lip lesion 05/06/2022   Vaginal itching 05/06/2022   Left ankle pain 05/06/2022   Contamination of blood  culture 05/06/2022   Uremia    Pericardial effusion without cardiac tamponade    Altered mental status 05/03/2022   Syphilis    Pain management    Septic arthritis of multiple joints ([place]) 02/01/2022   Group G streptococcal infection 02/01/2022   Left wrist pain    Hyperkalemia 01/30/2022   Effusion of right knee    Arthritis, septic ([place])    Dialysis patient, noncompliant    Lower leg pain 01/29/2022   Hypokalemia 01/29/2022   Hyperphosphatemia 01/29/2022   Nonspecific chest pain    Hypervolemia associated with renal insufficiency 12/23/2021   Elevated troponin 12/23/2021   ESRD on dialysis ([place]) 12/04/2021   Tobacco use 08/11/2021   Nipple discharge 08/11/2021   Breast pain 08/11/2021   Exposure to sexually transmitted disease (STD) 23/55/7322   Complication of vascular dialysis catheter 02/12/2021   Moderate protein-calorie malnutrition ([place]) 01/27/2021   Allergy, unspecified, initial encounter 01/23/2021   Anemia in chronic kidney disease 01/23/2021   End stage renal disease ([place]) 01/23/2021   Iron deficiency anemia, unspecified 01/23/2021   Other specified coagulation defects ([place]) 01/23/2021   Secondary hyperparathyroidism of renal origin ([place]) 01/23/2021   Anemia    Suicidal ideations    Influenza vaccine refused 12/19/2020   Morbid obesity ([place]) 01/30/2020   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   Cocaine use, 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[place]) 01/30/2020   Mood disorder ([place]) 06/27/2019   Borderline personality disorder ([place]) 11/16/2018   Moderate cannabis use disorder ([place]) 11/16/2018   Severe recurrent major  depression without psychotic features ([place]) 11/15/2018   Chronic hypertension 04/04/2018   PTSD (post-traumatic stress disorder) 04/01/2018   Home Medication(s) Prior to Admission medications   Medication Sig Start Date End Date Taking? Authorizing Provider  ARIPiprazole (ABILIFY) 5 MG tablet Take 1.5 tablets (7.5 mg total) by mouth  daily. 09/02/22   Rai, Ripudeep K, MD  b complex-vitamin c-folic acid (NEPHRO-VITE) 0.8 MG TABS tablet Take 1 tablet by mouth daily. 09/02/22   Rai, Vernelle Emerald, MD  calcitRIOL (ROCALTROL) 0.25 MCG capsule Take 1 capsule (0.25 mcg total) by mouth Every Tuesday,Thursday,and Saturday with dialysis. 09/03/22   Rai, Vernelle Emerald, MD  calcium acetate (PHOSLO) 667 MG capsule Take 1 capsule (667 mg total) by mouth 3 (three) times daily with meals. 09/02/22   Rai, Vernelle Emerald, MD  dicyclomine (BENTYL) 20 MG tablet Take 1 tablet (20 mg total) by mouth every 6 (six) hours as needed for spasms (abdominal cramping). 09/02/22   Rai, Vernelle Emerald, MD  gabapentin (NEURONTIN) 300 MG capsule Take 1 capsule (300 mg total) by mouth 2 (two) times daily. 09/02/22   Rai, Vernelle Emerald, MD  hydrOXYzine (VISTARIL) 25 MG capsule Take 1 capsule (25 mg total) by mouth every 8 (eight) hours as needed for anxiety. 09/02/22   Rai, Vernelle Emerald, MD  lidocaine-prilocaine (EMLA) cream Apply 1 application topically daily as needed (port site access on dialysis days (Tuesday's, Thursday's, and Saturday's). Patient not taking: Reported on 08/24/2022 03/06/21   [provider]  losartan (COZAAR) 50 MG tablet Take 1 tablet (50 mg total) by mouth daily. 09/02/22   Rai, Vernelle Emerald, MD  methocarbamol (ROBAXIN) 500 MG tablet Take 1 tablet (500 mg total) by mouth every 8 (eight) hours as needed for muscle spasms. 09/02/22   Rai, Vernelle Emerald, MD  sevelamer carbonate (RENVELA) 800 MG tablet Take 2 tablets (1,600 mg total) by mouth 3 (three) times daily with meals. 09/02/22   Rai, Ripudeep Raliegh Ip, MD  torsemide (DEMADEX) 100 MG tablet Take 1 tablet (100 mg total) by mouth every morning. 09/02/22   Rai, Vernelle Emerald, MD  traZODone (DESYREL) 100 MG tablet Take 1 tablet (100 mg total) by mouth at bedtime. 09/02/22   Mendel Corning, MD                                                                                                                                    Past  Surgical History Past Surgical History:  Procedure Laterality Date   AV FISTULA PLACEMENT Left 10/18/2020   Procedure: LEFT ARM ARTERIOVENOUS (AV) FISTULA CREATION;  Surgeon: Serafina Mitchell, MD;  Location: [facility] OR;  Service: Vascular;  Laterality: Left;   CHOLECYSTECTOMY     extraction of wisdom teeth     FISTULA SUPERFICIALIZATION Left 02/13/2021   Procedure: LEFT BRACHIOCEPHALIC ARTERIOVENOUS FISTULA SUPERFICIALIZATION;  Surgeon: Serafina Mitchell, MD;  Location: [place];  Service: Vascular;  Laterality: Left;  I & D EXTREMITY Left 01/30/2022   Procedure: IRRIGATION AND DEBRIDEMENT EXTREMITY;  Surgeon: Milly Jakob, MD;  Location: [place];  Service: Orthopedics;  Laterality: Left;   INCISION AND DRAINAGE OF WOUND Left 01/30/2022   Procedure: LEFT WRIST ASPIRATION;  Surgeon: Vanetta Mulders, MD;  Location: [place];  Service: Orthopedics;  Laterality: Left;   KNEE ARTHROSCOPY Right 01/30/2022   Procedure: ARTHROSCOPY KNEE AND IRRIIGATION AND DEBRIDMENT; LEFT WRIST ASPIRATION;  Surgeon: Vanetta Mulders, MD;  Location: [place];  Service: Orthopedics;  Laterality: Right;   RENAL BIOPSY     x 2   TUNNELED VENOUS CATHETER PLACEMENT  02/11/2021   [facility]   Family History Family History  Adopted: Yes  Problem Relation Age of Onset   Diabetes Other    Hypertension Other     Social History Social History   Tobacco Use   Smoking status: Every Day    Packs/day: 0.50    Years: 10.00    Total pack years: 5.00    Types: Cigarettes   Smokeless tobacco: Never  Vaping Use   Vaping Use: Never used  Substance Use Topics   Alcohol use: Not Currently    Comment: "maybe 3 a month."- liquor   Drug use: Yes    Types: Marijuana, Cocaine    Comment: reports daily use of both   Allergies Bupropion, Prozac [fluoxetine hcl], and Prednisone  Review of Systems Review of Systems  Constitutional:  Positive for fatigue.  Respiratory:  Positive for shortness of breath.   Gastrointestinal:  Positive  for nausea.    Physical Exam Vital Signs  I have reviewed the triage vital signs BP (!) 153/121 (BP Location: Right Arm)   Pulse (!) 110   Temp 97.7 F (36.5 C) (Oral)   Resp 18   Ht 5\' 8"  (1.727 m)   Wt 81.6 kg   SpO2 98%   BMI 27.37 kg/m   Physical Exam Vitals and nursing note reviewed.  Constitutional:      General: She is not in acute distress.    Appearance: She is well-developed. She is ill-appearing.  HENT:     Head: Normocephalic and atraumatic.  Eyes:     Conjunctiva/sclera: Conjunctivae normal.  Cardiovascular:     Rate and Rhythm: Regular rhythm. Tachycardia present.     Heart sounds: No murmur heard. Pulmonary:     Effort: Pulmonary effort is normal. No respiratory distress.     Breath sounds: Rales present.  Abdominal:     Palpations: Abdomen is soft.     Tenderness: There is no abdominal tenderness.  Musculoskeletal:        General: No swelling.     Cervical back: Neck supple.  Skin:    General: Skin is warm and dry.     Capillary Refill: Capillary refill takes less than 2 seconds.  Neurological:     Mental Status: She is alert.  Psychiatric:     Comments: Agitated, hyperactive     ED Results and Treatments Labs (all labs ordered are listed, but only abnormal results are displayed) Labs Reviewed  CBC WITH DIFFERENTIAL/PLATELET - Abnormal; Notable for the following components:      Result Value   WBC 13.4 (*)    RBC 2.87 (*)    Hemoglobin 8.9 (*)    HCT 29.0 (*)    MCV 101.0 (*)    RDW 19.9 (*)    Neutro Abs 11.1 (*)    Abs Immature Granulocytes 0.08 (*)  All other components within normal limits  COMPREHENSIVE METABOLIC PANEL - Abnormal; Notable for the following components:   Potassium >7.5 (*)    Chloride 97 (*)    CO2 13 (*)    BUN 176 (*)    Creatinine, Ser 20.62 (*)    Calcium 10.5 (*)    AST 84 (*)    ALT 57 (*)    GFR, Estimated 2 (*)    Anion gap 28 (*)    All other components within normal limits  RAPID URINE DRUG  SCREEN, HOSP PERFORMED  BLOOD GAS, VENOUS  BASIC METABOLIC PANEL  POTASSIUM  POTASSIUM  POTASSIUM  POTASSIUM  I-STAT CHEM 8, ED                                                                                                                          Radiology DG Chest 2 View  Result Date: 09/29/2022 CLINICAL DATA:  Shortness of breath. EXAM: CHEST - 2 VIEW COMPARISON:  Portable chest 09/17/2022 FINDINGS: AP Lat 5:42 a.m. The heart is moderately enlarged. Mild perihilar vascular congestion continues to be seen but has improved, with no overt interstitial edema. Small pleural effusions and overlying airspace disease are also improved. The mid and upper lungs are clear. The mediastinal configuration is stable. Slight thoracic dextroscoliosis. IMPRESSION: 1. Improved perihilar vascular congestion and small pleural effusions. No overt edema. 2. Stable cardiomegaly. 3. Improved aeration of the lung bases. Electronically Signed   By: Telford Nab M.D.   On: 09/29/2022 06:05    Pertinent labs & imaging results that were available during my care of the patient were reviewed by me and considered in my medical decision making (see MDM for details).  Medications Ordered in ED Medications  calcium gluconate inj 10% (1 g) URGENT USE ONLY! (has no administration in time range)  insulin aspart (novoLOG) injection 5 Units (has no administration in time range)    And  dextrose 50 % solution 50 mL (has no administration in time range)  LORazepam (ATIVAN) injection 0.5 mg (0.5 mg Intravenous Given 09/29/22 0605)  albuterol (PROVENTIL) (2.5 MG/3ML) 0.083% nebulizer solution 10 mg (10 mg Nebulization Given 09/29/22 8101)                                                                                                                                     Procedures .Critical Care  Performed  by: Teressa Lower, MD Authorized by: Teressa Lower, MD   Critical care provider statement:    Critical care time  (minutes):  30   Critical care was necessary to treat or prevent imminent or life-threatening deterioration of the following conditions:  Metabolic crisis and renal failure   Critical care was time spent personally by me on the following activities:  Development of treatment plan with patient or surrogate, discussions with consultants, evaluation of patient's response to treatment, examination of patient, ordering and review of laboratory studies, ordering and review of radiographic studies, ordering and performing treatments and interventions, pulse oximetry, re-evaluation of patient's condition and review of old charts   (including critical care time)  Medical Decision Making / ED Course   This patient presents to the ED for concern of missed dialysis, this involves an extensive number of treatment options, and is a complaint that carries with it a high risk of complications and morbidity.  The differential diagnosis includes dialysis noncompliance, critical hyperkalemia, fluid overload, sepsis, polysubstance abuse  MDM: Patient seen the emergency room for evaluation of multiple complaints described above.  Physical exam reveals a tachycardic, ill-appearing patient with rales at bilateral bases and agitation consistent with her crack cocaine use.  Initial ECG with sinus tachycardia but QRS elongation and peaked T waves.  I then immediately ordered a i-STAT Chem-8 that shows a potassium of approximately 8.2 with an undetectably high BUN and creatinine.  Follow-up comprehensive laboratory work-up shows a leukocytosis to 13.4, hemoglobin 8.9, potassium was greater than 7.5, bicarb 13, BUN 176, creatinine 20.62, AST 84, ALT 57 with an anion gap of 28.  Chest x-ray with no acute pathology.  Patient required 0.5 mg of intramuscular Ativan for [place] to obtain our work-up given her agitation secondary to her crack cocaine use.  High-dose albuterol initiated and calcium, insulin and glucose are ordered.  We had  significant difficulty with peripheral access and IV team consult is currently in place.  I consulted nephrology and spoke with Dr. Moshe Cipro who will help arrange emergent dialysis.  Patient then signed out to oncoming provider.  Please see provider signout for continuation of work-up.   Additional history obtained:  -External records from outside source obtained and reviewed including: Chart review including previous notes, labs, imaging, consultation notes   Lab Tests: -I ordered, reviewed, and interpreted labs.   The pertinent results include:   Labs Reviewed  CBC WITH DIFFERENTIAL/PLATELET - Abnormal; Notable for the following components:      Result Value   WBC 13.4 (*)    RBC 2.87 (*)    Hemoglobin 8.9 (*)    HCT 29.0 (*)    MCV 101.0 (*)    RDW 19.9 (*)    Neutro Abs 11.1 (*)    Abs Immature Granulocytes 0.08 (*)    All other components within normal limits  COMPREHENSIVE METABOLIC PANEL - Abnormal; Notable for the following components:   Potassium >7.5 (*)    Chloride 97 (*)    CO2 13 (*)    BUN 176 (*)    Creatinine, Ser 20.62 (*)    Calcium 10.5 (*)    AST 84 (*)    ALT 57 (*)    GFR, Estimated 2 (*)    Anion gap 28 (*)    All other components within normal limits  RAPID URINE DRUG SCREEN, HOSP PERFORMED  BLOOD GAS, VENOUS  BASIC METABOLIC PANEL  POTASSIUM  POTASSIUM  POTASSIUM  POTASSIUM  I-STAT CHEM 8, ED  EKG   EKG Interpretation  Date/Time:    Ventricular Rate:    PR Interval:    QRS Duration:   QT Interval:    QTC Calculation:   R Axis:     Text Interpretation:         QRS elongation, sinus tachycardia with peaked T waves with significant concern for hyperkalemia  Imaging Studies ordered: I ordered imaging studies including chest x-ray I independently visualized and interpreted imaging. I agree with the radiologist interpretation   Medicines ordered and prescription drug management: Meds ordered this encounter  Medications    LORazepam (ATIVAN) injection 0.5 mg   calcium gluconate inj 10% (1 g) URGENT USE ONLY!   AND Linked Order Group    insulin aspart (novoLOG) injection 5 Units    dextrose 50 % solution 50 mL   albuterol (PROVENTIL) (2.5 MG/3ML) 0.083% nebulizer solution 10 mg   LORazepam (ATIVAN) 2 MG/ML injection    Beck, [place] M: cabinet override    -I have reviewed the patients home medicines and have made adjustments as needed  Critical interventions High-dose of-year-old, insulin, glucose, nephrology consultation  Consultations Obtained: I requested consultation with the nephrologist Dr. Moshe Cipro,  and discussed lab and imaging findings as well as pertinent plan - they recommend: Emergent dialysis   Cardiac Monitoring: The patient was maintained on a cardiac monitor.  I personally viewed and interpreted the cardiac monitored which showed an underlying rhythm of: QRS elongation, sinus tachycardia, peaked T waves  Social Determinants of Health:  Factors impacting patients care include: Active crack cocaine use, dialysis noncompliance   Reevaluation: After the interventions noted above, I reevaluated the patient and found that they have :improved  Co morbidities that complicate the patient evaluation  Past Medical History:  Diagnosis Date   Asthma    as a child, no problem as an adult, no inhaler   Complication of anesthesia    woke up before tube removed, 1 time fought nurses   ESRD on hemodialysis [facility])    M-W-F   History of borderline personality disorder    Hypertension    diagnosed as child; stopped meds at 82 yo   Insomnia    Neuromuscular disorder ([place])    peripheral neuropathy   PTSD (post-traumatic stress disorder)       Dispostion: I considered admission for this patient, and disposition pending completion of ER interventions.  Patient will require hospital admission today's     Final Clinical Impression(s) / ED Diagnoses Final diagnoses:  None      @PCDICTATION @    Teressa Lower, MD 09/29/22 [phone]

## 2022-09-29 NOTE — ED Notes (Signed)
RN attempted to roll pt to her side to place zoll pads at this time. A crystal material which appeared to be a pipe fell of from pt shirt with a black substance observed with it. Pt continues to be erratic and agitated. Taken to dialysis by transporter.

## 2022-09-29 NOTE — Assessment & Plan Note (Signed)
Chronic problem.  Plan TOC consult

## 2022-09-29 NOTE — Assessment & Plan Note (Signed)
BP adequately controlled  Plan Continue home meds 

## 2022-09-29 NOTE — Assessment & Plan Note (Addendum)
Per ED-RN on return from HD patient was combative and awake. Had Ativan prior to HD but has had no sedating medications since. Per ED-RN crack pipe found in her bedsheets. On exam patient unarousable. She does groan and move away from painful stimuli. Post HD lab with K 4.6  Plan Repeat CMet, CBCD, urine drug screen  no sedating medications  CT head

## 2022-09-29 NOTE — Assessment & Plan Note (Signed)
No signs of decompensation after HD. Last ECHO 08/25/22 wth EF 44%, DD indeterminent.  Plan Continue meds per chart review.

## 2022-09-29 NOTE — ED Provider Notes (Signed)
  Physical Exam  BP 139/82   Pulse 91   Temp (!) 97.5 F (36.4 C) (Axillary)   Resp 20   Ht 5\' 8"  (1.727 m)   Wt 81.6 kg   SpO2 100%   BMI 27.37 kg/m   Physical Exam  Procedures  Procedures  ED Course / MDM    Medical Decision Making Amount and/or Complexity of Data Reviewed Labs: ordered. ECG/medicine tests: ordered.  Risk OTC drugs. Prescription drug management.   ***

## 2022-09-29 NOTE — Progress Notes (Addendum)
Overnight progress note  Informed by RN that patient is lethargic.  Her blood glucose was checked and found to be in the 40s and she was given 1 amp of D50.  She was also earlier found with a crack pipe in her room.   Patient seen at bedside.  Upon my arrival, she was not able to answer any questions but moving all extremities spontaneously and mumbling words.  Intermittently falling asleep but waking up as soon as her name is called.  Patient had her eyes closed and unable to examine pupils as she would not cooperate.  Otherwise, she is moving all extremities and does not have any focal weakness.  Blood pressure elevated with systolic in the 458P but remainder of vital signs stable.  H&P from admitting provider and medication record reviewed.  Patient had a dose of Ativan 0.5 mg at 6:05 AM but no opiates are any other sedating medications administered since then.  She received NovoLog 5 units along with D50 for treatment of hyperkalemia at 7:11 AM.    After 1 amp of D50, repeat CBG 65 and was given additional 1 amp D50.  CBG now improved to 92.  Suspect encephalopathy is multifactorial from substance abuse and hypoglycemia.  She did have a CT head done this afternoon which was negative.  Will continue to monitor very closely.  Continue CBG checks every 2 hours. UDS ordered.  Addendum/update 09/29/2022 at 10:20 PM: Notified by RN that patient is still somnolent but able to talk a little more after improvement of blood glucose.

## 2022-09-29 NOTE — ED Triage Notes (Signed)
Says she has not had dialysis since she was here last time.   Also sts toxic shock syndrome but doesn't want to be judged.   Flopping around in wheelchair not wanting to talk.   Requesting food and says she has not slept in 5 days.

## 2022-09-29 NOTE — Consult Note (Signed)
Renal Service Consult Note [facility]  Solstice Lastinger 09/29/2022 [name], MD Requesting Physician: Dr. Linda Hedges  Reason for Consult: ESRD pt w/ hyperkalemia HPI: The patient is a 26 y.o. year-old w/ hx of ESRD on HD, HTN, PTSD, homelessness who presented to ED c/o insomnia, feeling bad, no dialysis since last time in the hospital (> 2 wks). In ED K+ > 7.5 and creat 20, BUN 176. CXR vasc congestion. BP 153/121, HR 80s , RR 13-22.  We were asked to see for acute dialysis. She went upstairs for HD w/ low K+ bath. Post HD K+ improved to 4.6. Pt was admitted. Asked to see for ESRD.   Pt seen during HD session this am. She was severely fatigued and could not give answers.   ROS - n/a  Past Medical History  Past Medical History:  Diagnosis Date   Asthma    as a child, no problem as an adult, no inhaler   Complication of anesthesia    woke up before tube removed, 1 time fought nurses   ESRD on hemodialysis [facility])    M-W-F   History of borderline personality disorder    Hypertension    diagnosed as child; stopped meds at 60 yo   Insomnia    Neuromuscular disorder ([place])    peripheral neuropathy   Nonspecific chest pain    PTSD (post-traumatic stress disorder)    Past Surgical History  Past Surgical History:  Procedure Laterality Date   AV FISTULA PLACEMENT Left 10/18/2020   Procedure: LEFT ARM ARTERIOVENOUS (AV) FISTULA CREATION;  Surgeon: Serafina Mitchell, MD;  Location: [facility] OR;  Service: Vascular;  Laterality: Left;   CHOLECYSTECTOMY     extraction of wisdom teeth     FISTULA SUPERFICIALIZATION Left 02/13/2021   Procedure: LEFT BRACHIOCEPHALIC ARTERIOVENOUS FISTULA SUPERFICIALIZATION;  Surgeon: Serafina Mitchell, MD;  Location: [place];  Service: Vascular;  Laterality: Left;   I & D EXTREMITY Left 01/30/2022   Procedure: IRRIGATION AND DEBRIDEMENT EXTREMITY;  Surgeon: Milly Jakob, MD;  Location: [employer];  Service: Orthopedics;  Laterality: Left;   INCISION AND  DRAINAGE OF WOUND Left 01/30/2022   Procedure: LEFT WRIST ASPIRATION;  Surgeon: Vanetta Mulders, MD;  Location: [place];  Service: Orthopedics;  Laterality: Left;   KNEE ARTHROSCOPY Right 01/30/2022   Procedure: ARTHROSCOPY KNEE AND IRRIIGATION AND DEBRIDMENT; LEFT WRIST ASPIRATION;  Surgeon: Vanetta Mulders, MD;  Location: [place];  Service: Orthopedics;  Laterality: Right;   RENAL BIOPSY     x 2   TUNNELED VENOUS CATHETER PLACEMENT  02/11/2021   [facility]   Family History  Family History  Adopted: Yes  Problem Relation Age of Onset   Diabetes Other    Hypertension Other    Social History  reports that she has been smoking cigarettes. She has a 5.00 pack-year smoking history. She has never used smokeless tobacco. She reports that she does not currently use alcohol. She reports current drug use. Drugs: Marijuana and Cocaine. Allergies  Allergies  Allergen Reactions   Bupropion Anxiety, Other (See Comments) and Nausea And Vomiting    Caused panic attacks  Caused panic attacks    "Adverse effects"   Prozac [Fluoxetine Hcl] Anxiety and Other (See Comments)    Caused panic attacks   Prednisone Other (See Comments)    Pt stated this med caused pancreatitis   Home medications Prior to Admission medications   Medication Sig Start Date End Date Taking? Authorizing Provider  ARIPiprazole (ABILIFY)  5 MG tablet Take 1.5 tablets (7.5 mg total) by mouth daily. 09/02/22   Rai, Ripudeep K, MD  b complex-vitamin c-folic acid (NEPHRO-VITE) 0.8 MG TABS tablet Take 1 tablet by mouth daily. 09/02/22   Rai, Vernelle Emerald, MD  calcitRIOL (ROCALTROL) 0.25 MCG capsule Take 1 capsule (0.25 mcg total) by mouth Every Tuesday,Thursday,and Saturday with dialysis. 09/03/22   Rai, Vernelle Emerald, MD  calcium acetate (PHOSLO) 667 MG capsule Take 1 capsule (667 mg total) by mouth 3 (three) times daily with meals. 09/02/22   Rai, Vernelle Emerald, MD  dicyclomine (BENTYL) 20 MG tablet Take 1 tablet (20 mg total) by mouth every  6 (six) hours as needed for spasms (abdominal cramping). 09/02/22   Rai, Vernelle Emerald, MD  gabapentin (NEURONTIN) 300 MG capsule Take 1 capsule (300 mg total) by mouth 2 (two) times daily. 09/02/22   Rai, Vernelle Emerald, MD  hydrOXYzine (VISTARIL) 25 MG capsule Take 1 capsule (25 mg total) by mouth every 8 (eight) hours as needed for anxiety. 09/02/22   Rai, Vernelle Emerald, MD  lidocaine-prilocaine (EMLA) cream Apply 1 application topically daily as needed (port site access on dialysis days (Tuesday's, Thursday's, and Saturday's). Patient not taking: Reported on 08/24/2022 03/06/21   [provider]  losartan (COZAAR) 50 MG tablet Take 1 tablet (50 mg total) by mouth daily. 09/02/22   Rai, Vernelle Emerald, MD  methocarbamol (ROBAXIN) 500 MG tablet Take 1 tablet (500 mg total) by mouth every 8 (eight) hours as needed for muscle spasms. 09/02/22   Rai, Vernelle Emerald, MD  sevelamer carbonate (RENVELA) 800 MG tablet Take 2 tablets (1,600 mg total) by mouth 3 (three) times daily with meals. 09/02/22   Rai, Ripudeep Raliegh Ip, MD  torsemide (DEMADEX) 100 MG tablet Take 1 tablet (100 mg total) by mouth every morning. 09/02/22   Rai, Vernelle Emerald, MD  traZODone (DESYREL) 100 MG tablet Take 1 tablet (100 mg total) by mouth at bedtime. 09/02/22   Rai, Ripudeep K, MD     Vitals:   09/29/22 1318 09/29/22 1325 09/29/22 1330 09/29/22 1500  BP:  (!) 147/87 (!) 143/82 (!) 150/80  Pulse:  91 91 91  Resp:  18 16 20   Temp: (!) 97.5 F (36.4 C)     TempSrc: Axillary     SpO2:  99% 99% 98%  Weight:      Height:       Exam Gen sleeping on HD , did not awaken No rash, cyanosis or gangrene Sclera anicteric, throat clear  No jvd or bruits Chest clear bilat to bases, no rales/ wheezing RRR no MRG Abd soft ntnd no mass or ascites +bs GU defer MS no joint effusions or deformity Ext bilat 2+ diffuse pretib/ pedal edema, mild hip edema Neuro as above    AVF+bruit LUA      OP HD: [facility] TTS  3.5h  350/1.5  2/2.5 bath  Hep 5000  LUA  AVF    Assessment/ Plan: Severe hyperkalemia - treated w/ HD this am, post HD K+ 4.6.  Severe uremia ESRD - on HD but is homeless and has missed all her outpt HD sessions. Last HD was in hospital. HD today and reassess tomorrow.  Homelessness - driving factor HTN/ vol overload - not severe, typical for this patient w/ LE edema, not a lot of resp issues H/o substance abuse      Kelly Splinter  MD 09/29/2022, 3:33 PM Recent Labs  Lab 09/29/22 0542 09/29/22 1025 09/29/22 1248 09/29/22 1259  HGB 8.9*  --   --  8.8*  ALBUMIN 3.5  --   --   --   CALCIUM 10.5* 9.7 9.5  --   CREATININE 20.62* 13.34* 12.04*  --   K >7.5* 5.3* 4.7 4.6   Inpatient medications:  ARIPiprazole  7.5 mg Oral Daily   [START ON 10/01/2022] calcitRIOL  0.25 mcg Oral Q T,Th,Sa-HD   calcium acetate  667 mg Oral TID WC   Chlorhexidine Gluconate Cloth  6 each Topical Q0600   gabapentin  300 mg Oral BID   heparin  5,000 Units Subcutaneous Q8H   losartan  50 mg Oral Daily   sevelamer carbonate  1,600 mg Oral TID WC   sodium chloride flush  3 mL Intravenous Q12H   torsemide  100 mg Oral q morning   traZODone  100 mg Oral QHS    sodium chloride     sodium chloride, dicyclomine, hydrOXYzine, methocarbamol, sodium chloride flush

## 2022-09-29 NOTE — ED Notes (Signed)
Pt is Lethargic, will respond to voice. Rathore, MD has been notified regarding patients current condition and mental status. Waiting on orders.

## 2022-09-29 NOTE — Assessment & Plan Note (Signed)
Patient presented to [facility] having missed HD for several sessions, with hyperkalemia and fluid overload. She went from ED for emergent HD with resulting improvement: decrease K to 4.6, normalization of EKG with resolution of peaked T waves and QT prolongation.  Plan Observation x 24-48 hrs with follow up lab  TOC to assist with scheduling regular [facility] HD

## 2022-09-29 NOTE — Progress Notes (Signed)
Received patient in E.D. Patient is lethargic.  Access use. Left upper arm AVF- NO ISSUE. Hd treatment no issue. Fluid removed: 3 Liters Meds given: None Hands off to ED nurse.

## 2022-09-29 NOTE — Procedures (Addendum)
   I was present at this dialysis session, have reviewed the session itself and made  appropriate changes Kelly Splinter MD [place] pager [phone]   09/29/2022, 4:48 PM

## 2022-09-29 NOTE — Subjective & Objective (Signed)
Natasha Long, a 26 y/o with ESRD--HD who is chronically non-adherent and misses appointment, HFrEF, HTN, polysubstance abuse, multiple mental health issues including PTSD/depression was last admitted to [facility] 10/16 for fluid overload after missing several HD appointments. Other most recent admissions 10/1, 10/5. She now presents, having missed HD since last discharge, c/o SOB.

## 2022-09-29 NOTE — Plan of Care (Signed)
  Problem: Education: Goal: Knowledge of General Education information will improve Description Including pain rating scale, medication(s)/side effects and non-pharmacologic comfort measures Outcome: Progressing   

## 2022-09-29 NOTE — ED Provider Notes (Signed)
7:15 AM Care assumed from Dr. Matilde Sprang.  At time of transfer care, patient is awaiting emergent dialysis.  Will discuss with nephrology what level of care this patient will need.  She reportedly has not dialysis in over 3 weeks.  She is complaining of jitteriness, shortness of breath, and nausea and was found to be hypoxic on arrival.   7:31 AM Patient has been taken to dialysis.  Anticipate discussion with nephrology either during or after dialysis completed to determine level of care needs after intervention.  8:12 AM She spoke with Dr. Jonnie Finner with nephrology who suspects the patient will continue to be stable after dialysis and will not need ICU level care.  We will await her return from dialysis and then will call for medicine admission.   Natasha Long, Natasha Allegra, MD 09/29/22 1329

## 2022-09-30 DIAGNOSIS — I10 Essential (primary) hypertension: Secondary | ICD-10-CM

## 2022-09-30 DIAGNOSIS — J45909 Unspecified asthma, uncomplicated: Secondary | ICD-10-CM | POA: Diagnosis present

## 2022-09-30 DIAGNOSIS — Z5900 Homelessness unspecified: Secondary | ICD-10-CM | POA: Diagnosis not present

## 2022-09-30 DIAGNOSIS — F1910 Other psychoactive substance abuse, uncomplicated: Secondary | ICD-10-CM | POA: Diagnosis present

## 2022-09-30 DIAGNOSIS — Z992 Dependence on renal dialysis: Secondary | ICD-10-CM

## 2022-09-30 DIAGNOSIS — R079 Chest pain, unspecified: Secondary | ICD-10-CM

## 2022-09-30 DIAGNOSIS — F1410 Cocaine abuse, uncomplicated: Secondary | ICD-10-CM | POA: Diagnosis present

## 2022-09-30 DIAGNOSIS — I5020 Unspecified systolic (congestive) heart failure: Secondary | ICD-10-CM

## 2022-09-30 DIAGNOSIS — D72829 Elevated white blood cell count, unspecified: Secondary | ICD-10-CM | POA: Diagnosis present

## 2022-09-30 DIAGNOSIS — I5022 Chronic systolic (congestive) heart failure: Secondary | ICD-10-CM | POA: Diagnosis present

## 2022-09-30 DIAGNOSIS — E162 Hypoglycemia, unspecified: Secondary | ICD-10-CM | POA: Diagnosis present

## 2022-09-30 DIAGNOSIS — Z5329 Procedure and treatment not carried out because of patient's decision for other reasons: Secondary | ICD-10-CM | POA: Diagnosis not present

## 2022-09-30 DIAGNOSIS — Z8249 Family history of ischemic heart disease and other diseases of the circulatory system: Secondary | ICD-10-CM | POA: Diagnosis not present

## 2022-09-30 DIAGNOSIS — G928 Other toxic encephalopathy: Secondary | ICD-10-CM

## 2022-09-30 DIAGNOSIS — N186 End stage renal disease: Secondary | ICD-10-CM

## 2022-09-30 DIAGNOSIS — F1220 Cannabis dependence, uncomplicated: Secondary | ICD-10-CM | POA: Diagnosis present

## 2022-09-30 DIAGNOSIS — Z91158 Patient's noncompliance with renal dialysis for other reason: Secondary | ICD-10-CM | POA: Diagnosis not present

## 2022-09-30 DIAGNOSIS — F603 Borderline personality disorder: Secondary | ICD-10-CM | POA: Diagnosis present

## 2022-09-30 DIAGNOSIS — F32A Depression, unspecified: Secondary | ICD-10-CM | POA: Diagnosis present

## 2022-09-30 DIAGNOSIS — G4700 Insomnia, unspecified: Secondary | ICD-10-CM | POA: Diagnosis present

## 2022-09-30 DIAGNOSIS — E875 Hyperkalemia: Secondary | ICD-10-CM | POA: Diagnosis present

## 2022-09-30 DIAGNOSIS — F4310 Post-traumatic stress disorder, unspecified: Secondary | ICD-10-CM | POA: Diagnosis present

## 2022-09-30 DIAGNOSIS — I3139 Other pericardial effusion (noninflammatory): Secondary | ICD-10-CM | POA: Diagnosis present

## 2022-09-30 DIAGNOSIS — I132 Hypertensive heart and chronic kidney disease with heart failure and with stage 5 chronic kidney disease, or end stage renal disease: Secondary | ICD-10-CM | POA: Diagnosis present

## 2022-09-30 DIAGNOSIS — Z833 Family history of diabetes mellitus: Secondary | ICD-10-CM | POA: Diagnosis not present

## 2022-09-30 DIAGNOSIS — E8770 Fluid overload, unspecified: Secondary | ICD-10-CM | POA: Diagnosis present

## 2022-09-30 LAB — BASIC METABOLIC PANEL
Anion gap: 20 — ABNORMAL HIGH (ref 5–15)
BUN: 104 mg/dL — ABNORMAL HIGH (ref 6–20)
CO2: 20 mmol/L — ABNORMAL LOW (ref 22–32)
Calcium: 9.6 mg/dL (ref 8.9–10.3)
Chloride: 97 mmol/L — ABNORMAL LOW (ref 98–111)
Creatinine, Ser: 13.47 mg/dL — ABNORMAL HIGH (ref 0.44–1.00)
GFR, Estimated: 3 mL/min — ABNORMAL LOW (ref >60)
Glucose, Bld: 92 mg/dL (ref 70–99)
Potassium: 5.3 mmol/L — ABNORMAL HIGH (ref 3.5–5.1)
Sodium: 137 mmol/L (ref 135–145)

## 2022-09-30 LAB — CBC WITH DIFFERENTIAL/PLATELET
Abs Immature Granulocytes: 0.03 10*3/uL (ref 0.00–0.07)
Basophils Absolute: 0 10*3/uL (ref 0.0–0.1)
Basophils Relative: 0 %
Eosinophils Absolute: 0.1 10*3/uL (ref 0.0–0.5)
Eosinophils Relative: 1 %
HCT: 24.1 % — ABNORMAL LOW (ref 36.0–46.0)
Hemoglobin: 7.9 g/dL — ABNORMAL LOW (ref 12.0–15.0)
Immature Granulocytes: 0 %
Lymphocytes Relative: 8 %
Lymphs Abs: 0.8 10*3/uL (ref 0.7–4.0)
MCH: 31 pg (ref 26.0–34.0)
MCHC: 32.8 g/dL (ref 30.0–36.0)
MCV: 94.5 fL (ref 80.0–100.0)
Monocytes Absolute: 0.5 10*3/uL (ref 0.1–1.0)
Monocytes Relative: 5 %
Neutro Abs: 8.4 10*3/uL — ABNORMAL HIGH (ref 1.7–7.7)
Neutrophils Relative %: 86 %
Platelets: 226 10*3/uL (ref 150–400)
RBC: 2.55 MIL/uL — ABNORMAL LOW (ref 3.87–5.11)
RDW: 19.8 % — ABNORMAL HIGH (ref 11.5–15.5)
WBC: 9.8 10*3/uL (ref 4.0–10.5)
nRBC: 0 % (ref 0.0–0.2)

## 2022-09-30 LAB — BLOOD GAS, VENOUS
Acid-base deficit: 3.9 mmol/L — ABNORMAL HIGH (ref 0.0–2.0)
Bicarbonate: 21.6 mmol/L (ref 20.0–28.0)
O2 Saturation: 86.7 %
Patient temperature: 36.8
pCO2, Ven: 40 mmHg — ABNORMAL LOW (ref 44–60)
pH, Ven: 7.34 (ref 7.25–7.43)
pO2, Ven: 59 mmHg — ABNORMAL HIGH (ref 32–45)

## 2022-09-30 LAB — COMPREHENSIVE METABOLIC PANEL
ALT: 46 U/L — ABNORMAL HIGH (ref 0–44)
AST: 60 U/L — ABNORMAL HIGH (ref 15–41)
Albumin: 2.7 g/dL — ABNORMAL LOW (ref 3.5–5.0)
Alkaline Phosphatase: 78 U/L (ref 38–126)
Anion gap: 22 — ABNORMAL HIGH (ref 5–15)
BUN: 107 mg/dL — ABNORMAL HIGH (ref 6–20)
CO2: 19 mmol/L — ABNORMAL LOW (ref 22–32)
Calcium: 9.5 mg/dL (ref 8.9–10.3)
Chloride: 95 mmol/L — ABNORMAL LOW (ref 98–111)
Creatinine, Ser: 13.45 mg/dL — ABNORMAL HIGH (ref 0.44–1.00)
GFR, Estimated: 3 mL/min — ABNORMAL LOW (ref >60)
Glucose, Bld: 108 mg/dL — ABNORMAL HIGH (ref 70–99)
Potassium: 5.3 mmol/L — ABNORMAL HIGH (ref 3.5–5.1)
Sodium: 136 mmol/L (ref 135–145)
Total Bilirubin: 1 mg/dL (ref 0.3–1.2)
Total Protein: 6.2 g/dL — ABNORMAL LOW (ref 6.5–8.1)

## 2022-09-30 LAB — POTASSIUM
Potassium: 5.3 mmol/L — ABNORMAL HIGH (ref 3.5–5.1)
Potassium: 5.3 mmol/L — ABNORMAL HIGH (ref 3.5–5.1)
Potassium: 5.2 mmol/L — ABNORMAL HIGH (ref 3.5–5.1)
Potassium: 4.8 mmol/L (ref 3.5–5.1)

## 2022-09-30 LAB — GLUCOSE, CAPILLARY
Glucose-Capillary: 103 mg/dL — ABNORMAL HIGH (ref 70–99)
Glucose-Capillary: 106 mg/dL — ABNORMAL HIGH (ref 70–99)
Glucose-Capillary: 131 mg/dL — ABNORMAL HIGH (ref 70–99)

## 2022-09-30 LAB — HEPATITIS B SURFACE ANTIBODY, QUANTITATIVE: Hep B S AB Quant (Post): >1000 m[IU]/mL (ref >9.9)

## 2022-09-30 MED ORDER — CHLORHEXIDINE GLUCONATE CLOTH 2 % EX PADS
6.0000 | MEDICATED_PAD | Freq: Every day | CUTANEOUS | Status: DC
  Administered 2022-10-01: 6 via TOPICAL

## 2022-09-30 MED ORDER — HYDRALAZINE HCL 20 MG/ML IJ SOLN
10.0000 mg | Freq: Four times a day (QID) | INTRAMUSCULAR | Status: DC | PRN
  Administered 2022-09-30 – 2022-10-01 (×2): 10 mg via INTRAVENOUS
  Filled 2022-09-30 (×2): qty 1

## 2022-09-30 NOTE — Progress Notes (Signed)
Met with pt at bedside. Pt did not converse much but did answer a few questions. Pt states she is homeless and has been staying mostly on the streets. Pt unsure of plans at d/c. Pt states she plans to stay in the [place] area. Explained to pt that new HD clinic placement is needed since pt never attended appts at [facility]. Referral submitted to [facility] admissions last evening and pt's case being processed for placement. Will assist as needed.   Melven Sartorius Renal Navigator [phone]

## 2022-09-30 NOTE — Progress Notes (Signed)
PROGRESS NOTE    Natasha Long  PJK:[id number] DOB: 10-Aug-1996 DOA: 09/29/2022 PCP: [name], MD    Brief Narrative:  [name], a 26 years old female with past medical history of end-stage renal disease on hemodialysis chronically nonadherent and has been missing appointments, heart failure with reduced ejection fraction, hypertension, polysubstance abuse, PTSD depression who was recently admitted to [facility] on 09/14/2022 for fluid overload after missing several hemodialysis appointments.  Now presenting to the hospital with shortness of breath having missed hemodialysis/discharge.  In the ED patient did have a hyperkalemia with severe fluid overload.  Nephrology was consulted for emergent hemodialysis.  Patient received hemodialysis and was admitted hospital for further evaluation.  TOC assisting in rescheduling up outpatient dialysis appointments.     Assessment and Plan: Principal Problem:   Hyperkalemia, diminished renal excretion Active Problems:   Toxic metabolic encephalopathy   Chronic hypertension   ESRD on dialysis ([place])   Heart failure with reduced ejection fraction ([facility])   Polysubstance abuse ([facility])   Hyperkalemia, secondary to to missed hemodialysis.   Initial potassium was more than 7.5.  Received hemodialysis.  Serum has improved after hemodialysis but potassium is still at 5.3 today.  Nephrology on board.  Will follow recommendation.  Creatinine at 13.4.  Toxic metabolic encephalopathy Secondary to missed dialysis/  Hypoglycemia.  Continue to monitor.  Patient is still encephalopathic.  Will need to closely monitor for hypoglycemia.   Chronic hypertension Continue losartan.  Add as needed hydralazine.   ESRD on dialysis [facility]) Mixed hemodialysis several sessions.  Received hemodialysis.  Nephrology on board.  Appears volume overloaded.  Chronic systolic heart failure with reduced ejection fraction ([place]) Presented with peripheral  edema.  Last ECHO 08/25/22 wth EF 44%, DD indeterminate.  Will need to be compliant with dialysis sessions.   Polysubstance abuse ([place]) Chronic problem.  Counseling done.    DVT prophylaxis: heparin injection 5,000 Units Start: 09/29/22 1415   Code Status:     Code Status: Full Code  Disposition: Home likely in 1 to 2 days  Status is: Observation  The patient will require care spanning > 2 midnights and should be moved to inpatient because: Missed hemodialysis, encephalopathy   Family Communication: None at bedside  Consultants:  Nephrology  Procedures:  Hemodialysis  Antimicrobials:  None  Anti-infectives (From admission, onward)    None       Subjective: Today, patient was seen and examined at bedside.  Patient complains of pain in the legs.  Appears somnolent but not much interactive.  Objective: Vitals:   09/29/22 2000 09/29/22 2100 09/29/22 2248 09/30/22 0424  BP: (!) 150/83 (!) 154/84 (!) 157/91 (!) 164/80  Pulse: 79 93 93 96  Resp: 16 (!) 25 18 18   Temp:   97.9 F (36.6 C) 98.2 F (36.8 C)  TempSrc:   Oral Oral  SpO2: 94% 90% 97% 92%  Weight:      Height:        Intake/Output Summary (Last 24 hours) at 09/30/2022 1145 Last data filed at 09/29/2022 1200 Gross per 24 hour  Intake --  Output 3000 ml  Net -3000 ml   Filed Weights   09/29/22 0531  Weight: 81.6 kg    Physical Examination: Body mass index is 27.37 kg/m.  General:  Average built, not in obvious distress, somnolent, closing eyes, not much interactive HENT:   No scleral pallor or icterus noted. Oral mucosa is moist.  Chest:   Diminished breath sounds  bilaterally. No crackles or wheezes.  CVS: S1 &S2 heard. No murmur.  Regular rate and rhythm. Abdomen: Soft, nontender, nondistended.  Bowel sounds are heard.   Extremities: No cyanosis, clubbing with the lower extremity edema peripheral pulses are palpable. Psych: Normal and, not much interactive, closing eyes, CNS:  No cranial nerve  deficits.  Power equal in all extremities.   Skin: Warm and dry.  No rashes noted.  Data Reviewed:   CBC: Recent Labs  Lab 09/29/22 0542 09/29/22 1259 09/30/22 0035  WBC 13.4*  --  9.8  NEUTROABS 11.1*  --  8.4*  HGB 8.9* 8.8* 7.9*  HCT 29.0* 26.0* 24.1*  MCV 101.0*  --  94.5  PLT 289  --  191    Basic Metabolic Panel: Recent Labs  Lab 09/29/22 0542 09/29/22 1025 09/29/22 1248 09/29/22 1259 09/30/22 0035 09/30/22 0356 09/30/22 1020  NA 138 138 139 133* 136 137  --   K >7.5* 5.3* 4.7 4.6 5.3* 5.3* 5.3*  CL 97* 97* 98  --  95* 97*  --   CO2 13* 19* 18*  --  19* 20*  --   GLUCOSE 71 79 57*  --  108* 92  --   BUN 176* 115* 95*  --  107* 104*  --   CREATININE 20.62* 13.34* 12.04*  --  13.45* 13.47*  --   CALCIUM 10.5* 9.7 9.5  --  9.5 9.6  --     Liver Function Tests: Recent Labs  Lab 09/29/22 0542 09/30/22 0035  AST 84* 60*  ALT 57* 46*  ALKPHOS 94 78  BILITOT 1.0 1.0  PROT 8.0 6.2*  ALBUMIN 3.5 2.7*     Radiology Studies: CT HEAD WO CONTRAST (5MM)  Result Date: 09/29/2022 CLINICAL DATA:  Altered mental status EXAM: CT HEAD WITHOUT CONTRAST TECHNIQUE: Contiguous axial images were obtained from the base of the skull through the vertex without intravenous contrast. RADIATION DOSE REDUCTION: This exam was performed according to the departmental dose-optimization program which includes automated exposure control, adjustment of the mA and/or kV according to patient size and/or use of iterative reconstruction technique. COMPARISON:  05/21/2022 FINDINGS: Brain: No acute intracranial findings are seen. There are no signs of bleeding within the cranium. Ventricles are not dilated. There is no focal edema or mass effect. Vascular: Unremarkable. Skull: Unremarkable. Sinuses/Orbits: There is mild mucosal thickening in the ethmoid sinus. Other: None. IMPRESSION: No acute intracranial findings are seen in noncontrast CT brain. Electronically Signed   By: Elmer Picker  M.D.   On: 09/29/2022 16:13   DG Chest 2 View  Result Date: 09/29/2022 CLINICAL DATA:  Shortness of breath. EXAM: CHEST - 2 VIEW COMPARISON:  Portable chest 09/17/2022 FINDINGS: AP Lat 5:42 a.m. The heart is moderately enlarged. Mild perihilar vascular congestion continues to be seen but has improved, with no overt interstitial edema. Small pleural effusions and overlying airspace disease are also improved. The mid and upper lungs are clear. The mediastinal configuration is stable. Slight thoracic dextroscoliosis. IMPRESSION: 1. Improved perihilar vascular congestion and small pleural effusions. No overt edema. 2. Stable cardiomegaly. 3. Improved aeration of the lung bases. Electronically Signed   By: Telford Nab M.D.   On: 09/29/2022 06:05      LOS: 0 days    [name], MD [facility] Available via Epic secure chat 7am-7pm After these hours, please refer to coverage provider listed on [email or web address] 09/30/2022, 11:45 AM

## 2022-09-30 NOTE — Progress Notes (Signed)
[facility] Progress Note  Subjective: seen in room, very sleepy, was asking for food overnight per RN  Vitals:   09/29/22 2000 09/29/22 2100 09/29/22 2248 09/30/22 0424  BP: (!) 150/83 (!) 154/84 (!) 157/91 (!) 164/80  Pulse: 79 93 93 96  Resp: 16 (!) 25 18 18   Temp:   97.9 F (36.6 C) 98.2 F (36.8 C)  TempSrc:   Oral Oral  SpO2: 94% 90% 97% 92%  Weight:      Height:        Exam: Gen sleeping  No jvd or bruits Chest clear bilat to bases RRR no MRG Abd soft ntnd no mass or ascites +bs Ext bilat 1+ LE edema Neuro as above    AVF+bruit LUA      OP HD: [facility] TTS  3.5h  350/1.5  2/2.5 bath  Hep 5000  LUA AVF   Assessment/ Plan: Severe hyperkalemia - treated w/ HD this am, post HD K+ 4.6.  Severe uremia - should be improving, was asking for food overnight. Sonmolent today. Will improve w/ more HD tomorrow.  ESRD - on HD but is homeless and has missed all her outpt HD sessions. Last HD was in hospital. Had HD here yesterday. Next HD tomorrow.  Homelessness - driving factor HTN/ vol overload - not severe, 3L off w/ HD yest H/o substance abuse       Natasha Long 09/30/2022, 2:52 PM   Recent Labs  Lab 09/29/22 0542 09/29/22 1025 09/29/22 1259 09/30/22 0035 09/30/22 0356 09/30/22 1020 09/30/22 1357  HGB 8.9*  --  8.8* 7.9*  --   --   --   ALBUMIN 3.5  --   --  2.7*  --   --   --   CALCIUM 10.5*   < >  --  9.5 9.6  --   --   CREATININE 20.62*   < >  --  13.45* 13.47*  --   --   K >7.5*   < > 4.6 5.3* 5.3* 5.3* 5.3*   < > = values in this interval not displayed.   No results for input(s): "IRON", "TIBC", "FERRITIN" in the last 168 hours. Inpatient medications:  ARIPiprazole  7.5 mg Oral Daily   [START ON 10/01/2022] calcitRIOL  0.25 mcg Oral Q T,Th,Sa-HD   calcium acetate  667 mg Oral TID WC   Chlorhexidine Gluconate Cloth  6 each Topical Q0600   gabapentin  300 mg Oral BID   heparin  5,000 Units Subcutaneous Q8H   losartan  50 mg Oral Daily    sevelamer carbonate  1,600 mg Oral TID WC   sodium chloride flush  3 mL Intravenous Q12H   torsemide  100 mg Oral q morning   traZODone  100 mg Oral QHS    sodium chloride     sodium chloride, dextrose, dicyclomine, hydrALAZINE, hydrOXYzine, methocarbamol, sodium chloride flush

## 2022-09-30 NOTE — Progress Notes (Signed)
Pt refused CBG check.

## 2022-09-30 NOTE — Care Management (Addendum)
Attempt to assess and discuss transportation with patient. Spoke loudly, directly at patient, rubbed shoulder, tapped forehead, tapped chest. She was awake but would not open her eyes, or verbally respond. She would only turn towards me and scrunch up her face with each tactile stimulation.  Unable to have any kind of conversation or perform assessment. Question her ability to manage transportation to HD as outpatient. Recommend psych consult or palliative consult. Secure chatted attending to consider psych consult, potentially still encephalopathic, will need time to see how she mentally clears prior to consulting psych or palliative.  TOC will continue to follow, please secure chat TOC if patient becomes more interactive and willing to communicate with staff.

## 2022-09-30 NOTE — Hospital Course (Addendum)
Ms. Lizardo, a 26 years old female with past medical history of end-stage renal disease on hemodialysis chronically nonadherent and has been missing appointments, heart failure with reduced ejection fraction, hypertension, polysubstance abuse, PTSD depression who was recently admitted to [facility] on 09/14/2022 for fluid overload after missing several hemodialysis appointments.  Now presenting to the hospital with shortness of breath having missed hemodialysis/discharge.  In the ED patient did have a hyperkalemia with severe fluid overload.  Nephrology was consulted for emergent hemodialysis.  Patient received hemodialysis and was admitted hospital for further evaluation.  TOC assisting in rescheduling up outpatient dialysis appointments.    Hyperkalemia, secondary to to missed hemodialysis.   Initial potassium was more than 7.5.  Received hemodialysis.  Serum has improved after hemodialysis.  Toxic metabolic encephalopathy Secondary to missed dialysis.  Hypoglycemia.  Continue to monitor.   Chronic hypertension Continue losartan.   ESRD on dialysis [facility]) Next hemodialysis several sessions.  Received hemodialysis.  Nephrology on board.  Chronic systolic heart failure with reduced ejection fraction ([place]) Presented with peripheral edema.  Last ECHO 08/25/22 wth EF 44%, DD indeterminate.  Will need to be compliant with dialysis sessions.   Polysubstance abuse ([place]) Chronic problem.  Counseling done.

## 2022-10-01 DIAGNOSIS — E875 Hyperkalemia: Secondary | ICD-10-CM | POA: Diagnosis not present

## 2022-10-01 DIAGNOSIS — I10 Essential (primary) hypertension: Secondary | ICD-10-CM | POA: Diagnosis not present

## 2022-10-01 DIAGNOSIS — N186 End stage renal disease: Secondary | ICD-10-CM | POA: Diagnosis not present

## 2022-10-01 DIAGNOSIS — I5020 Unspecified systolic (congestive) heart failure: Secondary | ICD-10-CM | POA: Diagnosis not present

## 2022-10-01 LAB — COMPREHENSIVE METABOLIC PANEL
ALT: 40 U/L (ref 0–44)
AST: 42 U/L — ABNORMAL HIGH (ref 15–41)
Albumin: 2.6 g/dL — ABNORMAL LOW (ref 3.5–5.0)
Alkaline Phosphatase: 74 U/L (ref 38–126)
Anion gap: 16 — ABNORMAL HIGH (ref 5–15)
BUN: 115 mg/dL — ABNORMAL HIGH (ref 6–20)
CO2: 24 mmol/L (ref 22–32)
Calcium: 8.4 mg/dL — ABNORMAL LOW (ref 8.9–10.3)
Chloride: 97 mmol/L — ABNORMAL LOW (ref 98–111)
Creatinine, Ser: 14.64 mg/dL — ABNORMAL HIGH (ref 0.44–1.00)
GFR, Estimated: 3 mL/min — ABNORMAL LOW (ref >60)
Glucose, Bld: 156 mg/dL — ABNORMAL HIGH (ref 70–99)
Potassium: 4.4 mmol/L (ref 3.5–5.1)
Sodium: 137 mmol/L (ref 135–145)
Total Bilirubin: 0.8 mg/dL (ref 0.3–1.2)
Total Protein: 6.1 g/dL — ABNORMAL LOW (ref 6.5–8.1)

## 2022-10-01 LAB — CBC
HCT: 24.5 % — ABNORMAL LOW (ref 36.0–46.0)
Hemoglobin: 8.1 g/dL — ABNORMAL LOW (ref 12.0–15.0)
MCH: 31.3 pg (ref 26.0–34.0)
MCHC: 33.1 g/dL (ref 30.0–36.0)
MCV: 94.6 fL (ref 80.0–100.0)
Platelets: 212 10*3/uL (ref 150–400)
RBC: 2.59 MIL/uL — ABNORMAL LOW (ref 3.87–5.11)
RDW: 19.3 % — ABNORMAL HIGH (ref 11.5–15.5)
WBC: 9.4 10*3/uL (ref 4.0–10.5)
nRBC: 0 % (ref 0.0–0.2)

## 2022-10-01 LAB — MAGNESIUM: Magnesium: 2.4 mg/dL (ref 1.7–2.4)

## 2022-10-01 LAB — GLUCOSE, CAPILLARY
Glucose-Capillary: 141 mg/dL — ABNORMAL HIGH (ref 70–99)
Glucose-Capillary: 168 mg/dL — ABNORMAL HIGH (ref 70–99)

## 2022-10-01 LAB — POTASSIUM: Potassium: 4.5 mmol/L (ref 3.5–5.1)

## 2022-10-01 MED ORDER — HEPARIN SODIUM (PORCINE) 1000 UNIT/ML IJ SOLN
INTRAMUSCULAR | Status: AC
  Filled 2022-10-01: qty 2

## 2022-10-01 MED ORDER — GABAPENTIN 300 MG PO CAPS: 300.0000 mg | ORAL_CAPSULE | Freq: Every day | ORAL | Status: DC

## 2022-10-01 NOTE — Plan of Care (Signed)
  Problem: Education: Goal: Knowledge of General Education information will improve Description: Including pain rating scale, medication(s)/side effects and non-pharmacologic comfort measures 10/01/2022 0426 by Suzy Bouchard, RN Outcome: Progressing 10/01/2022 0419 by Suzy Bouchard, RN Outcome: Progressing   Problem: Activity: Goal: Risk for activity intolerance will decrease Outcome: Progressing   Problem: Coping: Goal: Level of anxiety will decrease Outcome: Progressing   Problem: Safety: Goal: Ability to remain free from injury will improve Outcome: Progressing

## 2022-10-01 NOTE — Plan of Care (Signed)

## 2022-10-01 NOTE — Progress Notes (Signed)
Mobility Specialist - Progress Note   10/01/22 1145  Mobility  Activity Ambulated with assistance in hallway  Level of Assistance Standby assist, set-up cues, supervision of patient - no hands on  Assistive Device None  Distance Ambulated (ft) 200 ft  Activity Response Tolerated well  $Mobility charge 1 Mobility    Pt received in bed agreeable to mobility. Left in room w/ all needs met.   Paulla Dolly Mobility Specialist

## 2022-10-01 NOTE — Progress Notes (Addendum)
Natasha Long Progress Note   Subjective: Refusing to talk. Can't really tell if this is related to medication or her state of being. However noted that she has been on 600 mg Gabapentin daily. Decreased dose to 300 mg PO daily.   Objective Vitals:   09/30/22 2030 09/30/22 2358 10/01/22 0631 10/01/22 0818  BP: (!) 181/102 (!) 172/95 (!) 173/106 (!) 180/109  Pulse: 94 92 94 93  Resp: 16 18  17   Temp: 97.6 F (36.4 C)  98 F (36.7 C) 98.3 F (36.8 C)  TempSrc: Oral  Oral Oral  SpO2: 100% 100% 100% 92%  Weight:      Height:       Physical Exam General: Chronically ill appearing female in NAD Heart: S1,S2 RRR no rub Lungs: CTAB no WOB Abdomen: NABS, NT,ND Extremities:No LE edema. Dialysis Access: L AVF + T/B   Additional Objective Labs: Basic Metabolic Panel: Recent Labs  Lab 09/30/22 0035 09/30/22 0356 09/30/22 1020 09/30/22 1816 09/30/22 2130 10/01/22 0311  NA 136 137  --   --   --  137  K 5.3* 5.3*   < > 5.2* 4.8 4.4  CL 95* 97*  --   --   --  97*  CO2 19* 20*  --   --   --  24  GLUCOSE 108* 92  --   --   --  156*  BUN 107* 104*  --   --   --  115*  CREATININE 13.45* 13.47*  --   --   --  14.64*  CALCIUM 9.5 9.6  --   --   --  8.4*   < > = values in this interval not displayed.   Liver Function Tests: Recent Labs  Lab 09/29/22 0542 09/30/22 0035 10/01/22 0311  AST 84* 60* 42*  ALT 57* 46* 40  ALKPHOS 94 78 74  BILITOT 1.0 1.0 0.8  PROT 8.0 6.2* 6.1*  ALBUMIN 3.5 2.7* 2.6*   No results for input(s): "LIPASE", "AMYLASE" in the last 168 hours. CBC: Recent Labs  Lab 09/29/22 0542 09/29/22 1259 09/30/22 0035 10/01/22 0311  WBC 13.4*  --  9.8 9.4  NEUTROABS 11.1*  --  8.4*  --   HGB 8.9* 8.8* 7.9* 8.1*  HCT 29.0* 26.0* 24.1* 24.5*  MCV 101.0*  --  94.5 94.6  PLT 289  --  226 212   Blood Culture    Component Value Date/Time   SDES BLOOD RIGHT ANTECUBITAL 08/31/2022 0630   SDES BLOOD RIGHT ANTECUBITAL 08/31/2022 0630    SPECREQUEST  08/31/2022 0630    BOTTLES DRAWN AEROBIC AND ANAEROBIC Blood Culture adequate volume   SPECREQUEST  08/31/2022 0630    BOTTLES DRAWN AEROBIC AND ANAEROBIC Blood Culture results may not be optimal due to an inadequate volume of blood received in culture bottles   CULT  08/31/2022 0630    NO GROWTH 5 DAYS Performed at [facility] [address] [place], [place] [address]    CULT  08/31/2022 0630    NO GROWTH 5 DAYS Performed at [facility], [place] [address], [place], [place] [address]    REPTSTATUS 09/05/2022 FINAL 08/31/2022 0630   REPTSTATUS 09/05/2022 FINAL 08/31/2022 0630    Cardiac Enzymes: No results for input(s): "CKTOTAL", "CKMB", "CKMBINDEX", "TROPONINI" in the last 168 hours. CBG: Recent Labs  Lab 09/30/22 0037 09/30/22 1142 09/30/22 2038 10/01/22 0635 10/01/22 0820  GLUCAP 106* 103* 131* 141* 168*   Iron Studies:  No results for input(s): "IRON", "TIBC", "TRANSFERRIN", "FERRITIN" in the last 72 hours. @lablastinr3 @ Studies/Results: CT HEAD WO CONTRAST (5MM)  Result Date: 09/29/2022 CLINICAL DATA:  Altered mental status EXAM: CT HEAD WITHOUT CONTRAST TECHNIQUE: Contiguous axial images were obtained from the base of the skull through the vertex without intravenous contrast. RADIATION DOSE REDUCTION: This exam was performed according to the departmental dose-optimization program which includes automated exposure control, adjustment of the mA and/or kV according to patient size and/or use of iterative reconstruction technique. COMPARISON:  05/21/2022 FINDINGS: Brain: No acute intracranial findings are seen. There are no signs of bleeding within the cranium. Ventricles are not dilated. There is no focal edema or mass effect. Vascular: Unremarkable. Skull: Unremarkable. Sinuses/Orbits: There is mild mucosal thickening in the ethmoid sinus. Other: None. IMPRESSION: No acute intracranial findings are seen in noncontrast CT brain. Electronically Signed    By: Elmer Picker M.D.   On: 09/29/2022 16:13   Medications:  sodium chloride      ARIPiprazole  7.5 mg Oral Daily   calcitRIOL  0.25 mcg Oral Q T,Th,Sa-HD   calcium acetate  667 mg Oral TID WC   Chlorhexidine Gluconate Cloth  6 each Topical Q0600   gabapentin  300 mg Oral QHS   heparin  5,000 Units Subcutaneous Q8H   losartan  50 mg Oral Daily   sevelamer carbonate  1,600 mg Oral TID WC   sodium chloride flush  3 mL Intravenous Q12H   torsemide  100 mg Oral q morning   traZODone  100 mg Oral QHS     OP HD: [facility] TTS  3.5h  350/1.5  2/2.5 bath  Hep 5000  LUA AVF   Assessment/ Plan: Severe hyperkalemia - treated w/ HD this am, post HD K+ 4.6.  Severe uremia - should be improving, was asking for food overnight. Sonmolent today. Will improve w/ more HD today. ESRD - on HD but is homeless and has missed all her outpt HD sessions. Last HD was in hospital. Had HD here yesterday. Next HD today.   Homelessness - driving factor HTN/ vol overload - not severe, 3L off w/ HD 09/29/2022. UF as tolerated.  H/o substance abuse    [name] NP-C 10/01/2022, 9:12 AM  Natasha Long [phone]  Pt seen, examined and agree w A/P as above.  Natasha Splinter  MD 10/01/2022, 12:41 PM

## 2022-10-01 NOTE — Progress Notes (Signed)
Received patient in bed to unit.  Alert and oriented.  Informed consent signed and in chart.   Treatment initiated: 1434 Treatment completed: 1800  Patient tolerated well.  Transported back to the room  Alert, without acute distress.  Hand-off given to patient's nurse.   Access used: Fist[place] Access issues: none  Total UF removed: 2700 Medication(s) given: Heparin bolus Post HD VS: 98.2, 167/92(117), HR-97, RR-18, SP02-97 Post HD weight: 92.6kg   [facility]

## 2022-10-01 NOTE — Progress Notes (Signed)
PROGRESS NOTE    Natasha Long  PZW:[id number] DOB: [birth year] DOA: 09/29/2022 PCP: [name], MD    Brief Narrative:  Natasha Long, a 26 years old female with past medical history of end-stage renal disease on hemodialysis chronically nonadherent and has been missing appointments, heart failure with reduced ejection fraction, hypertension, polysubstance abuse, PTSD depression who was recently admitted to [facility] on 09/14/2022 for fluid overload after missing several hemodialysis appointments.  Now presenting to the hospital with shortness of breath having missed hemodialysis/discharge.  In the ED, patient did have a hyperkalemia with severe fluid overload.  Nephrology was consulted for emergent hemodialysis.  Patient received hemodialysis and was admitted hospital for further evaluation.     Assessment and Plan: Principal Problem:   Hyperkalemia, diminished renal excretion Active Problems:   Toxic metabolic encephalopathy   Chronic hypertension   ESRD on dialysis ([place])   Heart failure with reduced ejection fraction (HCC)   Polysubstance abuse (HCC)   Hyperkalemia, secondary to to missed hemodialysis.   Initial potassium was more than 7.5.  Received hemodialysis.  Creatinine today at 4.4. Marland KitchenWe will need dialysis set up as outpatient.  TOC involved.  Toxic metabolic encephalopathy likely uremic encephalopathy Secondary to missed dialysis, continue dialysis while in the hospital.    Chronic hypertension Continue losartan.  Add as needed hydralazine.   ESRD on dialysis [facility]) Missed hemodialysis several sessions.  Patient is currently homeless.  Will need social work assistance with dialysis set up transportation.  Nephrology on board for hemodialysis.  Follow-up overloaded.    Chronic systolic heart failure with reduced ejection fraction ([place]) Last ECHO 08/25/22 wth EF 44%, DD indeterminate.  Will need to be compliant with dialysis sessions.    Polysubstance abuse ([place]) Chronic problem.  Counseling done.    DVT prophylaxis: heparin injection 5,000 Units Start: 09/29/22 1415   Code Status:     Code Status: Full Code  Disposition: Uncertain at this time, patient is currently homeless and will need outpatient hemodialysis set up.  TOC involved.  Status is: Inpatient  The patient is inpatient because: Missed hemodialysis, uremic encephalopathy volume overload, need for hemodialysis set up as outpatient.   Family Communication:  None at bedside  Consultants:  Nephrology  Procedures:  Hemodialysis  Antimicrobials:  None  Anti-infectives (From admission, onward)    None       Subjective: Today, patient was seen and examined at bedside.  Patient complains of pain over the legs.  Otherwise seems to be sleepy and little confused.  Poor historian.  Denies any shortness of breath nausea vomiting.   Objective: Vitals:   09/30/22 2030 09/30/22 2358 10/01/22 0631 10/01/22 0818  BP: (!) 181/102 (!) 172/95 (!) 173/106 (!) 180/109  Pulse: 94 92 94 93  Resp: 16 18  17   Temp: 97.6 F (36.4 C)  98 F (36.7 C) 98.3 F (36.8 C)  TempSrc: Oral  Oral Oral  SpO2: 100% 100% 100% 92%  Weight:      Height:        Intake/Output Summary (Last 24 hours) at 10/01/2022 0831 Last data filed at 10/01/2022 0002 Gross per 24 hour  Intake 540 ml  Output --  Net 540 ml    Filed Weights   09/29/22 0531  Weight: 81.6 kg    Physical Examination: Body mass index is 27.37 kg/m.   General:  Average built, not in obvious distress, closing eyes, appears somnolent. HENT:   No scleral pallor or icterus noted.  Oral mucosa is moist.  Chest:    Diminished breath sounds bilaterally.  CVS: S1 &S2 heard. No murmur.  Regular rate and rhythm. Abdomen: Soft, nontender, nondistended.  Bowel sounds are heard.   Extremities: No cyanosis, clubbing with bilateral lower extremity edema peripheral pulses are palpable. Psych: Somnolent ,  communicating some, oriented to place, CNS: moves extremities, somnolent Skin: Warm and dry.  No rashes noted.   Data Reviewed:   CBC: Recent Labs  Lab 09/29/22 0542 09/29/22 1259 09/30/22 0035 10/01/22 0311  WBC 13.4*  --  9.8 9.4  NEUTROABS 11.1*  --  8.4*  --   HGB 8.9* 8.8* 7.9* 8.1*  HCT 29.0* 26.0* 24.1* 24.5*  MCV 101.0*  --  94.5 94.6  PLT 289  --  226 212     Basic Metabolic Panel: Recent Labs  Lab 09/29/22 1025 09/29/22 1248 09/29/22 1259 09/30/22 0035 09/30/22 0356 09/30/22 1020 09/30/22 1357 09/30/22 1816 09/30/22 2130 10/01/22 0311  NA 138 139 133* 136 137  --   --   --   --  137  K 5.3* 4.7 4.6 5.3* 5.3* 5.3* 5.3* 5.2* 4.8 4.4  CL 97* 98  --  95* 97*  --   --   --   --  97*  CO2 19* 18*  --  19* 20*  --   --   --   --  24  GLUCOSE 79 57*  --  108* 92  --   --   --   --  156*  BUN 115* 95*  --  107* 104*  --   --   --   --  115*  CREATININE 13.34* 12.04*  --  13.45* 13.47*  --   --   --   --  14.64*  CALCIUM 9.7 9.5  --  9.5 9.6  --   --   --   --  8.4*  MG  --   --   --   --   --   --   --   --   --  2.4     Liver Function Tests: Recent Labs  Lab 09/29/22 0542 09/30/22 0035 10/01/22 0311  AST 84* 60* 42*  ALT 57* 46* 40  ALKPHOS 94 78 74  BILITOT 1.0 1.0 0.8  PROT 8.0 6.2* 6.1*  ALBUMIN 3.5 2.7* 2.6*      Radiology Studies: CT HEAD WO CONTRAST (5MM)  Result Date: 09/29/2022 CLINICAL DATA:  Altered mental status EXAM: CT HEAD WITHOUT CONTRAST TECHNIQUE: Contiguous axial images were obtained from the base of the skull through the vertex without intravenous contrast. RADIATION DOSE REDUCTION: This exam was performed according to the departmental dose-optimization program which includes automated exposure control, adjustment of the mA and/or kV according to patient size and/or use of iterative reconstruction technique. COMPARISON:  05/21/2022 FINDINGS: Brain: No acute intracranial findings are seen. There are no signs of bleeding within  the cranium. Ventricles are not dilated. There is no focal edema or mass effect. Vascular: Unremarkable. Skull: Unremarkable. Sinuses/Orbits: There is mild mucosal thickening in the ethmoid sinus. Other: None. IMPRESSION: No acute intracranial findings are seen in noncontrast CT brain. Electronically Signed   By: Elmer Picker M.D.   On: 09/29/2022 16:13      LOS: 1 day    [name], MD [facility] Available via Epic secure chat 7am-7pm After these hours, please refer to coverage provider listed on [email or web address] 10/01/2022, 8:31 AM

## 2022-10-02 DIAGNOSIS — N186 End stage renal disease: Secondary | ICD-10-CM | POA: Diagnosis not present

## 2022-10-02 DIAGNOSIS — I10 Essential (primary) hypertension: Secondary | ICD-10-CM | POA: Diagnosis not present

## 2022-10-02 DIAGNOSIS — E875 Hyperkalemia: Secondary | ICD-10-CM | POA: Diagnosis not present

## 2022-10-02 DIAGNOSIS — I5020 Unspecified systolic (congestive) heart failure: Secondary | ICD-10-CM | POA: Diagnosis not present

## 2022-10-02 LAB — GLUCOSE, CAPILLARY: Glucose-Capillary: 131 mg/dL — ABNORMAL HIGH (ref 70–99)

## 2022-10-02 NOTE — Progress Notes (Signed)
Pt refusing all medication. As well as finger sticks

## 2022-10-02 NOTE — Discharge Summary (Signed)
Physician Discharge Summary   Patient: Natasha Long MRN: [id number] DOB: 09-15-1996  Admit date:     09/29/2022  Discharge date: 10/02/22  Discharge Physician: Corrie Mckusick Osiris Odriscoll   PCP: [name], MD   Recommendations at discharge:   Left [place].  Discharge Diagnoses: Principal Problem:   Hyperkalemia, diminished renal excretion Active Problems:   Toxic metabolic encephalopathy   Chronic hypertension   ESRD on dialysis ([place])   Heart failure with reduced ejection fraction (HCC)   Polysubstance abuse ([place])  Resolved Problems:   Nonspecific chest pain  Hospital Course: Natasha Long, a 26 years old female with past medical history of end-stage renal disease on hemodialysis chronically nonadherent and has been missing appointments, heart failure with reduced ejection fraction, hypertension, polysubstance abuse, PTSD depression who was recently admitted to [facility] on 09/14/2022 for fluid overload after missing several hemodialysis appointments.  Now presenting to the hospital with shortness of breath having missed hemodialysis/discharge.  In the ED patient did have a hyperkalemia with severe fluid overload.  Nephrology was consulted for emergent hemodialysis.  Patient received hemodialysis and was admitted hospital for further evaluation.   Following conditions were addressed during hospitalization.  Hyperkalemia, secondary to to missed hemodialysis.   Initial potassium was 7.5.  Received hemodialysis.  Latest potassium at 4.5 .   Toxic metabolic encephalopathy likely uremic encephalopathy Secondary to missed dialysis, received hemodialysis during hospitalization.  Has decided to leave [place] at this time.  As per nursing staff patient was alert awake oriented in the left independently with her belongings.    Chronic hypertension Continue losartan.  Add as needed hydralazine.   ESRD on dialysis [facility]) Missed hemodialysis  several sessions.  Patient is currently homeless.  TOC and nephrology was involved but patient has left AGAINST MEDICAL ADVICE.  Chronic systolic heart failure with reduced ejection fraction ([place]) Last ECHO 08/25/22 wth EF 44%, DD indeterminate.  Will need to be compliant with dialysis sessions.  Volume overloaded   Polysubstance abuse (HCC) Chronic problem.  Counseling done.   Consultants: Nephrology Procedures performed: Hemodialysis Disposition:  Left AGAINST MEDICAL ADVICE.  Diet recommendation:   DISCHARGE MEDICATION: Left AGAINST MEDICAL ADVICE  Discharge Exam: Danley Danker Weights   09/29/22 0531 10/01/22 1834 10/01/22 1835  Weight: 81.6 kg 95.3 kg 92.6 kg   Left AGAINST MEDICAL ADVICE  Condition at discharge:  Left AGAINST MEDICAL ADVICE  The results of significant diagnostics from this hospitalization (including imaging, microbiology, ancillary and laboratory) are listed below for reference.   Imaging Studies: CT HEAD WO CONTRAST (5MM)  Result Date: 09/29/2022 CLINICAL DATA:  Altered mental status EXAM: CT HEAD WITHOUT CONTRAST TECHNIQUE: Contiguous axial images were obtained from the base of the skull through the vertex without intravenous contrast. RADIATION DOSE REDUCTION: This exam was performed according to the departmental dose-optimization program which includes automated exposure control, adjustment of the mA and/or kV according to patient size and/or use of iterative reconstruction technique. COMPARISON:  05/21/2022 FINDINGS: Brain: No acute intracranial findings are seen. There are no signs of bleeding within the cranium. Ventricles are not dilated. There is no focal edema or mass effect. Vascular: Unremarkable. Skull: Unremarkable. Sinuses/Orbits: There is mild mucosal thickening in the ethmoid sinus. Other: None. IMPRESSION: No acute intracranial findings are seen in noncontrast CT brain. Electronically Signed   By: Elmer Picker M.D.   On: 09/29/2022 16:13   DG  Chest 2 View  Result Date: 09/29/2022 CLINICAL DATA:  Shortness of breath. EXAM:  CHEST - 2 VIEW COMPARISON:  Portable chest 09/17/2022 FINDINGS: AP Lat 5:42 a.m. The heart is moderately enlarged. Mild perihilar vascular congestion continues to be seen but has improved, with no overt interstitial edema. Small pleural effusions and overlying airspace disease are also improved. The mid and upper lungs are clear. The mediastinal configuration is stable. Slight thoracic dextroscoliosis. IMPRESSION: 1. Improved perihilar vascular congestion and small pleural effusions. No overt edema. 2. Stable cardiomegaly. 3. Improved aeration of the lung bases. Electronically Signed   By: Telford Nab M.D.   On: 09/29/2022 06:05   DG Chest Portable 1 View  Result Date: 09/17/2022 CLINICAL DATA:  Chest pain EXAM: PORTABLE CHEST 1 VIEW COMPARISON:  09/14/2022 FINDINGS: Cardiomegaly, vascular congestion. Bilateral pleural effusions, left larger than right. Bilateral lower lobe airspace opacities, left worse than right. No pneumothorax. No acute bony abnormality. IMPRESSION: Bilateral effusions with bilateral lower lobe airspace disease, both worse on the left. Cardiomegaly, vascular congestion. Electronically Signed   By: Rolm Baptise M.D.   On: 09/17/2022 02:07    Microbiology: Results for orders placed or performed during the hospital encounter of 08/30/22  Blood culture (routine x 2)     Status: None   Collection Time: 08/31/22  6:30 AM   Specimen: BLOOD  Result Value Ref Range Status   Specimen Description BLOOD RIGHT ANTECUBITAL  Final   Special Requests   Final    BOTTLES DRAWN AEROBIC AND ANAEROBIC Blood Culture adequate volume   Culture   Final    NO GROWTH 5 DAYS Performed at [facility], [place] [address] [place], [place] [address]    Report Status 09/05/2022 FINAL  Final  Blood culture (routine x 2)     Status: None   Collection Time: 08/31/22  6:30 AM   Specimen: BLOOD  Result Value Ref Range  Status   Specimen Description BLOOD RIGHT ANTECUBITAL  Final   Special Requests   Final    BOTTLES DRAWN AEROBIC AND ANAEROBIC Blood Culture results may not be optimal due to an inadequate volume of blood received in culture bottles   Culture   Final    NO GROWTH 5 DAYS Performed at [facility], [place] [address], [place], [place] [address]    Report Status 09/05/2022 FINAL  Final    Labs: CBC: Recent Labs  Lab 09/29/22 0542 09/29/22 1259 09/30/22 0035 10/01/22 0311  WBC 13.4*  --  9.8 9.4  NEUTROABS 11.1*  --  8.4*  --   HGB 8.9* 8.8* 7.9* 8.1*  HCT 29.0* 26.0* 24.1* 24.5*  MCV 101.0*  --  94.5 94.6  PLT 289  --  226 829   Basic Metabolic Panel: Recent Labs  Lab 09/29/22 1025 09/29/22 1248 09/29/22 1259 09/30/22 0035 09/30/22 0356 09/30/22 1020 09/30/22 1357 09/30/22 1816 09/30/22 2130 10/01/22 0311 10/01/22 1013  NA 138 139 133* 136 137  --   --   --   --  137  --   K 5.3* 4.7 4.6 5.3* 5.3*   < > 5.3* 5.2* 4.8 4.4 4.5  CL 97* 98  --  95* 97*  --   --   --   --  97*  --   CO2 19* 18*  --  19* 20*  --   --   --   --  24  --   GLUCOSE 79 57*  --  108* 92  --   --   --   --  156*  --  BUN 115* 95*  --  107* 104*  --   --   --   --  115*  --   CREATININE 13.34* 12.04*  --  13.45* 13.47*  --   --   --   --  14.64*  --   CALCIUM 9.7 9.5  --  9.5 9.6  --   --   --   --  8.4*  --   MG  --   --   --   --   --   --   --   --   --  2.4  --    < > = values in this interval not displayed.   Liver Function Tests: Recent Labs  Lab 09/29/22 0542 09/30/22 0035 10/01/22 0311  AST 84* 60* 42*  ALT 57* 46* 40  ALKPHOS 94 78 74  BILITOT 1.0 1.0 0.8  PROT 8.0 6.2* 6.1*  ALBUMIN 3.5 2.7* 2.6*   CBG: Recent Labs  Lab 09/30/22 1142 09/30/22 2038 10/01/22 0635 10/01/22 0820 10/02/22 0814  GLUCAP 103* 131* 141* 168* 131*    Discharge time spent: less than 30 minutes.  Signed: [name], MD [facility] 10/02/2022

## 2022-10-02 NOTE — Progress Notes (Signed)
[facility] admissions advised pt left AMA prior to being accepted at a local clinic.   Melven Sartorius Renal Navigator [phone]

## 2022-10-02 NOTE — Progress Notes (Signed)
Patient left AMA, patient AOX4, patient educated about the importance of hospitalization however, patient insisted on leaving. MD Pokhrel notified. Patient IV removed, AMA form signed.Patient left unit independently with belongings.

## 2022-10-02 NOTE — Progress Notes (Addendum)
PROGRESS NOTE    Natasha Long  TIR:[id number] DOB: [birth year] DOA: 09/29/2022 PCP: [name], MD    Brief Narrative:  Natasha Long, a 26 years old female with past medical history of end-stage renal disease on hemodialysis chronically nonadherent and has been missing appointments, heart failure with reduced ejection fraction, hypertension, polysubstance abuse, PTSD depression who was recently admitted to [facility] on 09/14/2022 for fluid overload after missing several hemodialysis appointments.  Now presenting to the hospital with shortness of breath having missed hemodialysis/discharge.  In the ED, patient did have  hyperkalemia with severe fluid overload.  Nephrology was consulted for emergent hemodialysis.  Patient received hemodialysis and was admitted hospital for further evaluation.     Assessment and Plan: Principal Problem:   Hyperkalemia, diminished renal excretion Active Problems:   Toxic metabolic encephalopathy   Chronic hypertension   ESRD on dialysis ([name])   Heart failure with reduced ejection fraction (HCC)   Polysubstance abuse (HCC)   Hyperkalemia, secondary to to missed hemodialysis.   Initial potassium was 7.5.  Received hemodialysis.  Latest potassium at 4.5 .We will need dialysis set up as outpatient.  TOC involved.  Toxic metabolic encephalopathy likely uremic encephalopathy Secondary to missed dialysis, continue dialysis while in the hospital. Patient is answering few more questions.  Will DC Robaxin.  Dose of Neurontin has been decreased.  On trazodone at nighttime.  Chronic hypertension Continue losartan.  Add as needed hydralazine.   ESRD on dialysis [facility]) Missed hemodialysis several sessions.  Patient is currently homeless.  Will need social work assistance with dialysis set up transportation.  Nephrology on board for hemodialysis.    Chronic systolic heart failure with reduced ejection fraction ([name]) Last ECHO 08/25/22 wth  EF 44%, DD indeterminate.  Will need to be compliant with dialysis sessions.  Volume overloaded   Polysubstance abuse (HCC) Chronic problem.  Counseling done.    DVT prophylaxis: heparin injection 5,000 Units Start: 09/29/22 1415   Code Status:     Code Status: Full Code  Disposition: Uncertain at this time, patient is currently homeless and will need outpatient hemodialysis set up.  TOC involved.  Status is: Inpatient  The patient is inpatient because: Missed hemodialysis, uremic encephalopathy,volume overload, need for hemodialysis set up as outpatient.   Family Communication:  None at bedside  Consultants:  Nephrology  Procedures:  Hemodialysis  Antimicrobials:  None  Anti-infectives (From admission, onward)    None       Subjective: Today, patient was seen and examined.  Closing her eyes but able to communicate and answer yes or no questions.  Denies pain, nausea, vomiting.  Able to tell me that he is in the hospital.   Objective: Vitals:   10/01/22 1835 10/01/22 1908 10/01/22 2000 10/02/22 0641  BP:  (!) 175/102 134/66 (!) 170/98  Pulse:  93 99 95  Resp:  20 20 18   Temp:  98.9 F (37.2 C) 98.8 F (37.1 C) 98.8 F (37.1 C)  TempSrc:  Oral Oral Oral  SpO2:  99% 100% 96%  Weight: 92.6 kg     Height:        Intake/Output Summary (Last 24 hours) at 10/02/2022 0735 Last data filed at 10/01/2022 1800 Gross per 24 hour  Intake 120 ml  Output 2700 ml  Net -2580 ml    Filed Weights   09/29/22 0531 10/01/22 1834 10/01/22 1835  Weight: 81.6 kg 95.3 kg 92.6 kg    Physical Examination: Body mass index is  31.04 kg/m.   General: Obese built, not in obvious distress, more interactive when asked, still closing her eyes, HENT:   No scleral pallor or icterus noted. Oral mucosa is moist.  Chest:  Clear breath sounds.  Diminished breath sounds bilaterally. No crackles or wheezes.  CVS: S1 &S2 heard. No murmur.  Regular rate and rhythm. Abdomen: Soft,  nontender, nondistended.  Bowel sounds are heard.   Extremities: No cyanosis, clubbing or edema.  Peripheral pulses are palpable.  Left upper extremity hemodialysis access. Psych: Oriented to place, answering yes or no, CNS: Still somnolent but answering few questions, Skin: Warm and dry.  No rashes noted.   Data Reviewed:   CBC: Recent Labs  Lab 09/29/22 0542 09/29/22 1259 09/30/22 0035 10/01/22 0311  WBC 13.4*  --  9.8 9.4  NEUTROABS 11.1*  --  8.4*  --   HGB 8.9* 8.8* 7.9* 8.1*  HCT 29.0* 26.0* 24.1* 24.5*  MCV 101.0*  --  94.5 94.6  PLT 289  --  226 212     Basic Metabolic Panel: Recent Labs  Lab 09/29/22 1025 09/29/22 1248 09/29/22 1259 09/30/22 0035 09/30/22 0356 09/30/22 1020 09/30/22 1357 09/30/22 1816 09/30/22 2130 10/01/22 0311 10/01/22 1013  NA 138 139 133* 136 137  --   --   --   --  137  --   K 5.3* 4.7 4.6 5.3* 5.3*   < > 5.3* 5.2* 4.8 4.4 4.5  CL 97* 98  --  95* 97*  --   --   --   --  97*  --   CO2 19* 18*  --  19* 20*  --   --   --   --  24  --   GLUCOSE 79 57*  --  108* 92  --   --   --   --  156*  --   BUN 115* 95*  --  107* 104*  --   --   --   --  115*  --   CREATININE 13.34* 12.04*  --  13.45* 13.47*  --   --   --   --  14.64*  --   CALCIUM 9.7 9.5  --  9.5 9.6  --   --   --   --  8.4*  --   MG  --   --   --   --   --   --   --   --   --  2.4  --    < > = values in this interval not displayed.     Liver Function Tests: Recent Labs  Lab 09/29/22 0542 09/30/22 0035 10/01/22 0311  AST 84* 60* 42*  ALT 57* 46* 40  ALKPHOS 94 78 74  BILITOT 1.0 1.0 0.8  PROT 8.0 6.2* 6.1*  ALBUMIN 3.5 2.7* 2.6*      Radiology Studies: No results found.    LOS: 2 days    [name], MD [facility] Available via Epic secure chat 7am-7pm After these hours, please refer to coverage provider listed on [email or web address] 10/02/2022, 7:35 AM

## 2022-10-02 NOTE — Progress Notes (Signed)
ADDING TO HER CHART FOR NEXT ED EVAL OR ADMIT ---   SHE LEFT AMA ON 10/02/2022 PRIOR TO ESTABLISHING A DIALYSIS CLINC/TIME FOR HER.  SHE DOES NOT HAVE A LOCAL DIALYSIS UNIT AT THIS TIME.  NEXT TIME SHE SHOWS UP, SHE NEEDS TO BE ADMITTED TO CONTINUE PLACEMENT FOR THIS.  [name], PA-C [employer] Pager [phone]

## 2022-10-03 ENCOUNTER — Other Ambulatory Visit: Payer: Self-pay

## 2022-10-03 ENCOUNTER — Inpatient Hospital Stay (HOSPITAL_COMMUNITY)
Admission: EM | Admit: 2022-10-03 | Discharge: 2022-10-08 | DRG: 640 | Discharge status: Against Medical Advice | Payer: Medicaid Other | Attending: Family Medicine | Admitting: Family Medicine

## 2022-10-03 ENCOUNTER — Emergency Department (HOSPITAL_COMMUNITY): Payer: Medicaid Other

## 2022-10-03 ENCOUNTER — Encounter (HOSPITAL_COMMUNITY): Payer: Self-pay | Admitting: Emergency Medicine

## 2022-10-03 DIAGNOSIS — J45909 Unspecified asthma, uncomplicated: Secondary | ICD-10-CM | POA: Diagnosis present

## 2022-10-03 DIAGNOSIS — I5041 Acute combined systolic (congestive) and diastolic (congestive) heart failure: Secondary | ICD-10-CM | POA: Diagnosis present

## 2022-10-03 DIAGNOSIS — F1910 Other psychoactive substance abuse, uncomplicated: Secondary | ICD-10-CM | POA: Diagnosis present

## 2022-10-03 DIAGNOSIS — Z91199 Patient's noncompliance with other medical treatment and regimen due to unspecified reason: Secondary | ICD-10-CM

## 2022-10-03 DIAGNOSIS — E669 Obesity, unspecified: Secondary | ICD-10-CM | POA: Diagnosis present

## 2022-10-03 DIAGNOSIS — E8770 Fluid overload, unspecified: Principal | ICD-10-CM | POA: Diagnosis present

## 2022-10-03 DIAGNOSIS — Z9151 Personal history of suicidal behavior: Secondary | ICD-10-CM

## 2022-10-03 DIAGNOSIS — I5033 Acute on chronic diastolic (congestive) heart failure: Secondary | ICD-10-CM | POA: Diagnosis present

## 2022-10-03 DIAGNOSIS — R45851 Suicidal ideations: Secondary | ICD-10-CM | POA: Diagnosis present

## 2022-10-03 DIAGNOSIS — I5032 Chronic diastolic (congestive) heart failure: Secondary | ICD-10-CM | POA: Diagnosis present

## 2022-10-03 DIAGNOSIS — F1210 Cannabis abuse, uncomplicated: Secondary | ICD-10-CM | POA: Diagnosis present

## 2022-10-03 DIAGNOSIS — I5043 Acute on chronic combined systolic (congestive) and diastolic (congestive) heart failure: Secondary | ICD-10-CM | POA: Diagnosis present

## 2022-10-03 DIAGNOSIS — I5023 Acute on chronic systolic (congestive) heart failure: Secondary | ICD-10-CM | POA: Diagnosis present

## 2022-10-03 DIAGNOSIS — R0789 Other chest pain: Secondary | ICD-10-CM

## 2022-10-03 DIAGNOSIS — I5031 Acute diastolic (congestive) heart failure: Secondary | ICD-10-CM | POA: Diagnosis present

## 2022-10-03 DIAGNOSIS — E785 Hyperlipidemia, unspecified: Secondary | ICD-10-CM | POA: Diagnosis present

## 2022-10-03 DIAGNOSIS — N186 End stage renal disease: Secondary | ICD-10-CM | POA: Diagnosis present

## 2022-10-03 DIAGNOSIS — Z79899 Other long term (current) drug therapy: Secondary | ICD-10-CM

## 2022-10-03 DIAGNOSIS — R000 Tachycardia, unspecified: Secondary | ICD-10-CM | POA: Diagnosis present

## 2022-10-03 DIAGNOSIS — F1414 Cocaine abuse with cocaine-induced mood disorder: Secondary | ICD-10-CM | POA: Diagnosis present

## 2022-10-03 DIAGNOSIS — Z91158 Patient's noncompliance with renal dialysis for other reason: Secondary | ICD-10-CM

## 2022-10-03 DIAGNOSIS — Z560 Unemployment, unspecified: Secondary | ICD-10-CM

## 2022-10-03 DIAGNOSIS — Z6831 Body mass index (BMI) 31.0-31.9, adult: Secondary | ICD-10-CM

## 2022-10-03 DIAGNOSIS — Z818 Family history of other mental and behavioral disorders: Secondary | ICD-10-CM

## 2022-10-03 DIAGNOSIS — I132 Hypertensive heart and chronic kidney disease with heart failure and with stage 5 chronic kidney disease, or end stage renal disease: Secondary | ICD-10-CM | POA: Diagnosis present

## 2022-10-03 DIAGNOSIS — F603 Borderline personality disorder: Secondary | ICD-10-CM | POA: Diagnosis present

## 2022-10-03 DIAGNOSIS — R040 Epistaxis: Secondary | ICD-10-CM | POA: Diagnosis not present

## 2022-10-03 DIAGNOSIS — F17210 Nicotine dependence, cigarettes, uncomplicated: Secondary | ICD-10-CM | POA: Diagnosis present

## 2022-10-03 DIAGNOSIS — G8929 Other chronic pain: Secondary | ICD-10-CM | POA: Diagnosis present

## 2022-10-03 DIAGNOSIS — Z5900 Homelessness unspecified: Secondary | ICD-10-CM

## 2022-10-03 DIAGNOSIS — I340 Nonrheumatic mitral (valve) insufficiency: Secondary | ICD-10-CM | POA: Diagnosis present

## 2022-10-03 DIAGNOSIS — Z992 Dependence on renal dialysis: Secondary | ICD-10-CM

## 2022-10-03 DIAGNOSIS — J440 Chronic obstructive pulmonary disease with acute lower respiratory infection: Secondary | ICD-10-CM | POA: Diagnosis present

## 2022-10-03 DIAGNOSIS — R7989 Other specified abnormal findings of blood chemistry: Secondary | ICD-10-CM | POA: Diagnosis present

## 2022-10-03 DIAGNOSIS — G9341 Metabolic encephalopathy: Secondary | ICD-10-CM | POA: Diagnosis present

## 2022-10-03 DIAGNOSIS — Z888 Allergy status to other drugs, medicaments and biological substances status: Secondary | ICD-10-CM

## 2022-10-03 DIAGNOSIS — I509 Heart failure, unspecified: Secondary | ICD-10-CM

## 2022-10-03 DIAGNOSIS — Z5329 Procedure and treatment not carried out because of patient's decision for other reasons: Secondary | ICD-10-CM | POA: Diagnosis not present

## 2022-10-03 DIAGNOSIS — G629 Polyneuropathy, unspecified: Secondary | ICD-10-CM | POA: Diagnosis present

## 2022-10-03 DIAGNOSIS — G928 Other toxic encephalopathy: Secondary | ICD-10-CM | POA: Diagnosis present

## 2022-10-03 DIAGNOSIS — E875 Hyperkalemia: Secondary | ICD-10-CM | POA: Diagnosis present

## 2022-10-03 DIAGNOSIS — R0682 Tachypnea, not elsewhere classified: Secondary | ICD-10-CM | POA: Diagnosis present

## 2022-10-03 DIAGNOSIS — D631 Anemia in chronic kidney disease: Secondary | ICD-10-CM | POA: Diagnosis present

## 2022-10-03 DIAGNOSIS — F1494 Cocaine use, unspecified with cocaine-induced mood disorder: Secondary | ICD-10-CM | POA: Diagnosis present

## 2022-10-03 DIAGNOSIS — F4310 Post-traumatic stress disorder, unspecified: Secondary | ICD-10-CM | POA: Diagnosis present

## 2022-10-03 DIAGNOSIS — Z8249 Family history of ischemic heart disease and other diseases of the circulatory system: Secondary | ICD-10-CM

## 2022-10-03 DIAGNOSIS — I3139 Other pericardial effusion (noninflammatory): Secondary | ICD-10-CM | POA: Diagnosis present

## 2022-10-03 DIAGNOSIS — R42 Dizziness and giddiness: Secondary | ICD-10-CM | POA: Diagnosis present

## 2022-10-03 DIAGNOSIS — F3181 Bipolar II disorder: Secondary | ICD-10-CM | POA: Diagnosis present

## 2022-10-03 DIAGNOSIS — J189 Pneumonia, unspecified organism: Secondary | ICD-10-CM | POA: Diagnosis present

## 2022-10-03 DIAGNOSIS — Z833 Family history of diabetes mellitus: Secondary | ICD-10-CM

## 2022-10-03 LAB — CBC
HCT: 28.1 % — ABNORMAL LOW (ref 36.0–46.0)
Hemoglobin: 8.9 g/dL — ABNORMAL LOW (ref 12.0–15.0)
MCH: 30.9 pg (ref 26.0–34.0)
MCHC: 31.7 g/dL (ref 30.0–36.0)
MCV: 97.6 fL (ref 80.0–100.0)
Platelets: 180 10*3/uL (ref 150–400)
RBC: 2.88 MIL/uL — ABNORMAL LOW (ref 3.87–5.11)
RDW: 19.3 % — ABNORMAL HIGH (ref 11.5–15.5)
WBC: 11 10*3/uL — ABNORMAL HIGH (ref 4.0–10.5)
nRBC: 0 % (ref 0.0–0.2)

## 2022-10-03 LAB — BASIC METABOLIC PANEL
Anion gap: 16 — ABNORMAL HIGH (ref 5–15)
BUN: 93 mg/dL — ABNORMAL HIGH (ref 6–20)
CO2: 24 mmol/L (ref 22–32)
Calcium: 9.3 mg/dL (ref 8.9–10.3)
Chloride: 99 mmol/L (ref 98–111)
Creatinine, Ser: 11.99 mg/dL — ABNORMAL HIGH (ref 0.44–1.00)
GFR, Estimated: 4 mL/min — ABNORMAL LOW (ref >60)
Glucose, Bld: 90 mg/dL (ref 70–99)
Potassium: 4.5 mmol/L (ref 3.5–5.1)
Sodium: 139 mmol/L (ref 135–145)

## 2022-10-03 LAB — TROPONIN I (HIGH SENSITIVITY): Troponin I (High Sensitivity): 718 ng/L (ref <18)

## 2022-10-03 NOTE — ED Triage Notes (Signed)
Pt [place] ED by [facility] [place] with c/o dizziness after smoking crack approximately 2hrs ago. Pt his hemodialysis pt T-TH-S, states last treatment was yesterday.   EMS Vitals BP 192/108 HR 110 SPO2 94% RA CBG 147

## 2022-10-03 NOTE — ED Notes (Signed)
Notified by lab of Trop 718. Notified Dr. Betsey Holiday and will work on getting patient back to exam room.

## 2022-10-03 NOTE — ED Provider Triage Note (Signed)
Emergency Medicine Provider Triage Evaluation Note  Natasha Long , a 26 y.o. female  was evaluated in triage.  Pt complains of chest pain and lightheadedness.  Patient history of ESRD, last dialysis yesterday.  Normal duration.  States that approximately 2 hours ago she smoked crack cocaine.  After that she began feeling lightheaded and dizzy.  She reports a syncopal episode approximately 1 hour ago.  No vomiting.  She has had similar symptoms in the past.  States that no one will help her with her substance abuse issue because of her dialysis.  Review of Systems  Positive: Syncope, chest pain Negative: Fever, cough  Physical Exam  BP (!) 156/95 (BP Location: Right Arm)   Pulse (!) 108   Temp 98.6 F (37 C) (Oral)   Resp 16   SpO2 100%  Gen:   Awake, no distress   Resp:  Normal effort  MSK:   Moves extremities without difficulty  Other:  Tachycardia, 3/6 systolic murmur across mid chest  Medical Decision Making  Medically screening exam initiated at 8:38 PM.  Appropriate orders placed.  Neeti Swiger was informed that the remainder of the evaluation will be completed by another provider, this initial triage assessment does not replace that evaluation, and the importance of remaining in the ED until their evaluation is complete.     Carlisle Cater, PA-C 10/03/22 2040

## 2022-10-04 ENCOUNTER — Inpatient Hospital Stay (HOSPITAL_COMMUNITY): Payer: Medicaid Other

## 2022-10-04 DIAGNOSIS — E8770 Fluid overload, unspecified: Secondary | ICD-10-CM | POA: Diagnosis present

## 2022-10-04 DIAGNOSIS — E669 Obesity, unspecified: Secondary | ICD-10-CM | POA: Diagnosis present

## 2022-10-04 DIAGNOSIS — I3139 Other pericardial effusion (noninflammatory): Secondary | ICD-10-CM | POA: Diagnosis present

## 2022-10-04 DIAGNOSIS — F17210 Nicotine dependence, cigarettes, uncomplicated: Secondary | ICD-10-CM | POA: Diagnosis present

## 2022-10-04 DIAGNOSIS — I509 Heart failure, unspecified: Secondary | ICD-10-CM

## 2022-10-04 DIAGNOSIS — Z992 Dependence on renal dialysis: Secondary | ICD-10-CM | POA: Diagnosis not present

## 2022-10-04 DIAGNOSIS — Z5329 Procedure and treatment not carried out because of patient's decision for other reasons: Secondary | ICD-10-CM | POA: Diagnosis not present

## 2022-10-04 DIAGNOSIS — F1910 Other psychoactive substance abuse, uncomplicated: Secondary | ICD-10-CM | POA: Diagnosis not present

## 2022-10-04 DIAGNOSIS — F1494 Cocaine use, unspecified with cocaine-induced mood disorder: Secondary | ICD-10-CM | POA: Diagnosis not present

## 2022-10-04 DIAGNOSIS — Z6831 Body mass index (BMI) 31.0-31.9, adult: Secondary | ICD-10-CM | POA: Diagnosis not present

## 2022-10-04 DIAGNOSIS — I340 Nonrheumatic mitral (valve) insufficiency: Secondary | ICD-10-CM | POA: Diagnosis present

## 2022-10-04 DIAGNOSIS — I5023 Acute on chronic systolic (congestive) heart failure: Secondary | ICD-10-CM

## 2022-10-04 DIAGNOSIS — J440 Chronic obstructive pulmonary disease with acute lower respiratory infection: Secondary | ICD-10-CM | POA: Diagnosis present

## 2022-10-04 DIAGNOSIS — I132 Hypertensive heart and chronic kidney disease with heart failure and with stage 5 chronic kidney disease, or end stage renal disease: Secondary | ICD-10-CM | POA: Diagnosis present

## 2022-10-04 DIAGNOSIS — R7989 Other specified abnormal findings of blood chemistry: Secondary | ICD-10-CM | POA: Diagnosis not present

## 2022-10-04 DIAGNOSIS — F603 Borderline personality disorder: Secondary | ICD-10-CM | POA: Diagnosis present

## 2022-10-04 DIAGNOSIS — E785 Hyperlipidemia, unspecified: Secondary | ICD-10-CM | POA: Diagnosis present

## 2022-10-04 DIAGNOSIS — F1414 Cocaine abuse with cocaine-induced mood disorder: Secondary | ICD-10-CM | POA: Diagnosis present

## 2022-10-04 DIAGNOSIS — F3181 Bipolar II disorder: Secondary | ICD-10-CM | POA: Diagnosis present

## 2022-10-04 DIAGNOSIS — J189 Pneumonia, unspecified organism: Secondary | ICD-10-CM | POA: Diagnosis present

## 2022-10-04 DIAGNOSIS — Z79899 Other long term (current) drug therapy: Secondary | ICD-10-CM | POA: Diagnosis not present

## 2022-10-04 DIAGNOSIS — J45909 Unspecified asthma, uncomplicated: Secondary | ICD-10-CM | POA: Diagnosis present

## 2022-10-04 DIAGNOSIS — N186 End stage renal disease: Secondary | ICD-10-CM | POA: Diagnosis present

## 2022-10-04 DIAGNOSIS — D631 Anemia in chronic kidney disease: Secondary | ICD-10-CM | POA: Diagnosis present

## 2022-10-04 DIAGNOSIS — R45851 Suicidal ideations: Secondary | ICD-10-CM | POA: Diagnosis present

## 2022-10-04 DIAGNOSIS — G928 Other toxic encephalopathy: Secondary | ICD-10-CM | POA: Diagnosis present

## 2022-10-04 DIAGNOSIS — Z5900 Homelessness unspecified: Secondary | ICD-10-CM | POA: Diagnosis not present

## 2022-10-04 LAB — BASIC METABOLIC PANEL
Anion gap: 18 — ABNORMAL HIGH (ref 5–15)
BUN: 105 mg/dL — ABNORMAL HIGH (ref 6–20)
CO2: 23 mmol/L (ref 22–32)
Calcium: 8.9 mg/dL (ref 8.9–10.3)
Chloride: 97 mmol/L — ABNORMAL LOW (ref 98–111)
Creatinine, Ser: 12.55 mg/dL — ABNORMAL HIGH (ref 0.44–1.00)
GFR, Estimated: 4 mL/min — ABNORMAL LOW (ref >60)
Glucose, Bld: 112 mg/dL — ABNORMAL HIGH (ref 70–99)
Potassium: 4.7 mmol/L (ref 3.5–5.1)
Sodium: 138 mmol/L (ref 135–145)

## 2022-10-04 LAB — LIPID PANEL
Cholesterol: 109 mg/dL (ref 0–200)
HDL: 54 mg/dL (ref >40)
LDL Cholesterol: 42 mg/dL (ref 0–99)
Total CHOL/HDL Ratio: 2 RATIO
Triglycerides: 66 mg/dL (ref <150)
VLDL: 13 mg/dL (ref 0–40)

## 2022-10-04 LAB — TROPONIN I (HIGH SENSITIVITY)
Troponin I (High Sensitivity): 717 ng/L (ref <18)
Troponin I (High Sensitivity): 707 ng/L (ref <18)
Troponin I (High Sensitivity): 585 ng/L (ref <18)

## 2022-10-04 LAB — CK: Total CK: 180 U/L (ref 38–234)

## 2022-10-04 MED ORDER — FUROSEMIDE 10 MG/ML IJ SOLN
80.0000 mg | Freq: Two times a day (BID) | INTRAMUSCULAR | Status: DC
  Administered 2022-10-04 – 2022-10-05 (×2): 80 mg via INTRAVENOUS
  Filled 2022-10-04 (×2): qty 8

## 2022-10-04 MED ORDER — DILTIAZEM HCL 30 MG PO TABS
30.0000 mg | ORAL_TABLET | Freq: Four times a day (QID) | ORAL | Status: DC
  Filled 2022-10-04 (×2): qty 1

## 2022-10-04 MED ORDER — NITROGLYCERIN 0.4 MG SL SUBL: 0.4000 mg | SUBLINGUAL_TABLET | SUBLINGUAL | Status: DC | PRN

## 2022-10-04 MED ORDER — HYDROXYZINE HCL 10 MG PO TABS
25.0000 mg | ORAL_TABLET | Freq: Three times a day (TID) | ORAL | Status: DC | PRN
  Administered 2022-10-05 – 2022-10-07 (×4): 25 mg via ORAL
  Filled 2022-10-04 (×4): qty 3

## 2022-10-04 MED ORDER — ONDANSETRON HCL 4 MG/2ML IJ SOLN: 4.0000 mg | Freq: Four times a day (QID) | INTRAMUSCULAR | Status: DC | PRN

## 2022-10-04 MED ORDER — TRAZODONE HCL 100 MG PO TABS
100.0000 mg | ORAL_TABLET | Freq: Every day | ORAL | Status: DC
  Administered 2022-10-04 – 2022-10-06 (×3): 100 mg via ORAL
  Filled 2022-10-04 (×3): qty 1
  Filled 2022-10-04: qty 2

## 2022-10-04 MED ORDER — ASPIRIN 81 MG PO TBEC
81.0000 mg | DELAYED_RELEASE_TABLET | Freq: Every day | ORAL | Status: DC
  Administered 2022-10-04: 81 mg via ORAL
  Filled 2022-10-04: qty 1

## 2022-10-04 MED ORDER — METHOCARBAMOL 500 MG PO TABS: 500.0000 mg | ORAL_TABLET | Freq: Three times a day (TID) | ORAL | Status: DC | PRN

## 2022-10-04 MED ORDER — SODIUM CHLORIDE 0.9% FLUSH
3.0000 mL | Freq: Two times a day (BID) | INTRAVENOUS | Status: DC
  Administered 2022-10-04 – 2022-10-05 (×4): 3 mL via INTRAVENOUS

## 2022-10-04 MED ORDER — LOSARTAN POTASSIUM 50 MG PO TABS
50.0000 mg | ORAL_TABLET | Freq: Every day | ORAL | Status: DC
  Administered 2022-10-05: 50 mg via ORAL
  Filled 2022-10-04: qty 1

## 2022-10-04 MED ORDER — ACETAMINOPHEN 325 MG PO TABS
650.0000 mg | ORAL_TABLET | ORAL | Status: DC | PRN
  Administered 2022-10-05 – 2022-10-06 (×4): 650 mg via ORAL
  Filled 2022-10-04 (×4): qty 2

## 2022-10-04 MED ORDER — GABAPENTIN 300 MG PO CAPS: 300.0000 mg | ORAL_CAPSULE | Freq: Two times a day (BID) | ORAL | Status: DC

## 2022-10-04 MED ORDER — HEPARIN SODIUM (PORCINE) 5000 UNIT/ML IJ SOLN
5000.0000 [IU] | Freq: Two times a day (BID) | INTRAMUSCULAR | Status: DC
  Administered 2022-10-04: 5000 [IU] via SUBCUTANEOUS
  Filled 2022-10-04 (×2): qty 1

## 2022-10-04 MED ORDER — SODIUM CHLORIDE 0.9 % IV SOLN: 250.0000 mL | INTRAVENOUS | Status: DC | PRN

## 2022-10-04 MED ORDER — ARIPIPRAZOLE 5 MG PO TABS
7.5000 mg | ORAL_TABLET | Freq: Every day | ORAL | Status: DC
  Administered 2022-10-05 – 2022-10-07 (×2): 7.5 mg via ORAL
  Filled 2022-10-04 (×5): qty 2

## 2022-10-04 MED ORDER — SODIUM CHLORIDE 0.9% FLUSH: 3.0000 mL | INTRAVENOUS | Status: DC | PRN

## 2022-10-04 MED ORDER — CHLORHEXIDINE GLUCONATE CLOTH 2 % EX PADS
6.0000 | MEDICATED_PAD | Freq: Every day | CUTANEOUS | Status: DC
  Administered 2022-10-07: 6 via TOPICAL

## 2022-10-04 MED ORDER — SEVELAMER CARBONATE 800 MG PO TABS
1600.0000 mg | ORAL_TABLET | Freq: Three times a day (TID) | ORAL | Status: DC
  Administered 2022-10-05 – 2022-10-07 (×4): 1600 mg via ORAL
  Filled 2022-10-04 (×5): qty 2

## 2022-10-04 MED ORDER — CARVEDILOL 3.125 MG PO TABS: 3.1250 mg | ORAL_TABLET | Freq: Two times a day (BID) | ORAL | Status: DC

## 2022-10-04 MED ORDER — DICYCLOMINE HCL 20 MG PO TABS: 20.0000 mg | ORAL_TABLET | Freq: Four times a day (QID) | ORAL | Status: DC | PRN

## 2022-10-04 MED ORDER — LORAZEPAM 2 MG/ML IJ SOLN
1.0000 mg | Freq: Four times a day (QID) | INTRAMUSCULAR | Status: DC
  Administered 2022-10-04: 1 mg via INTRAVENOUS
  Filled 2022-10-04: qty 1

## 2022-10-04 MED ORDER — CALCIUM ACETATE (PHOS BINDER) 667 MG PO CAPS
667.0000 mg | ORAL_CAPSULE | Freq: Three times a day (TID) | ORAL | Status: DC
  Administered 2022-10-05 – 2022-10-07 (×4): 667 mg via ORAL
  Filled 2022-10-04 (×5): qty 1

## 2022-10-04 MED ORDER — LABETALOL HCL 5 MG/ML IV SOLN
10.0000 mg | Freq: Four times a day (QID) | INTRAVENOUS | Status: DC
  Administered 2022-10-04 – 2022-10-06 (×7): 10 mg via INTRAVENOUS
  Filled 2022-10-04 (×7): qty 4

## 2022-10-04 NOTE — ED Notes (Signed)
Patient transported to X-ray 

## 2022-10-04 NOTE — ED Provider Notes (Signed)
[facility] EMERGENCY DEPARTMENT Provider Note   CSN: [id number] Arrival date & time: 10/03/22  2024     History  Chief Complaint  Patient presents with   Dizziness    Natasha Long is a 26 y.o. female with a past medical history of end-stage renal disease on hemodialysis ([facility]), heart failure with reduced ejection fraction, hypertension, polysubstance abuse, depression presenting to the emergency department for evaluation of chest pain.  Patient was brought to the ED by [facility] [place] with complaint of dizziness after smoking crack.  She complains of having headache, dizziness, chest pain, shortness of breath, belly pain.  Last dialysis was on Thursday.  She missed dialysis on Saturday.  States she was anxious and left the hospital to smoke crack.  States she wants to go to rehab however there is only 1 rehab center for hemodialysis patient.   Dizziness Associated symptoms: chest pain        Home Medications Prior to Admission medications   Medication Sig Start Date End Date Taking? Authorizing Provider  ARIPiprazole (ABILIFY) 5 MG tablet Take 1.5 tablets (7.5 mg total) by mouth daily. 09/02/22   Rai, Ripudeep K, MD  b complex-vitamin c-folic acid (NEPHRO-VITE) 0.8 MG TABS tablet Take 1 tablet by mouth daily. 09/02/22   Rai, Delene Ruffini, MD  calcitRIOL (ROCALTROL) 0.25 MCG capsule Take 1 capsule (0.25 mcg total) by mouth Every Tuesday,Thursday,and Saturday with dialysis. 09/03/22   Rai, Delene Ruffini, MD  calcium acetate (PHOSLO) 667 MG capsule Take 1 capsule (667 mg total) by mouth 3 (three) times daily with meals. 09/02/22   Rai, Delene Ruffini, MD  dicyclomine (BENTYL) 20 MG tablet Take 1 tablet (20 mg total) by mouth every 6 (six) hours as needed for spasms (abdominal cramping). 09/02/22   Rai, Delene Ruffini, MD  gabapentin (NEURONTIN) 300 MG capsule Take 1 capsule (300 mg total) by mouth 2 (two) times daily. 09/02/22   Rai, Delene Ruffini, MD  hydrOXYzine  (VISTARIL) 25 MG capsule Take 1 capsule (25 mg total) by mouth every 8 (eight) hours as needed for anxiety. 09/02/22   Rai, Delene Ruffini, MD  lidocaine-prilocaine (EMLA) cream Apply 1 application topically daily as needed (port site access on dialysis days (Tuesday's, Thursday's, and Saturday's). Patient not taking: Reported on 08/24/2022 03/06/21   [provider]  losartan (COZAAR) 50 MG tablet Take 1 tablet (50 mg total) by mouth daily. 09/02/22   Rai, Delene Ruffini, MD  methocarbamol (ROBAXIN) 500 MG tablet Take 1 tablet (500 mg total) by mouth every 8 (eight) hours as needed for muscle spasms. 09/02/22   Rai, Delene Ruffini, MD  sevelamer carbonate (RENVELA) 800 MG tablet Take 2 tablets (1,600 mg total) by mouth 3 (three) times daily with meals. 09/02/22   Rai, Ripudeep Kirtland Bouchard, MD  torsemide (DEMADEX) 100 MG tablet Take 1 tablet (100 mg total) by mouth every morning. 09/02/22   Rai, Delene Ruffini, MD  traZODone (DESYREL) 100 MG tablet Take 1 tablet (100 mg total) by mouth at bedtime. 09/02/22   Rai, Delene Ruffini, MD      Allergies    Bupropion, Prozac [fluoxetine hcl], and Prednisone    Review of Systems   Review of Systems  Cardiovascular:  Positive for chest pain.  Neurological:  Positive for dizziness.    Physical Exam Updated Vital Signs BP (!) 150/85 (BP Location: Right Arm)   Pulse 88   Temp 98 F (36.7 C) (Oral)   Resp 16  LMP  (LMP Unknown)   SpO2 100%  Physical Exam Vitals and nursing note reviewed.  Constitutional:      Appearance: Normal appearance.  HENT:     Head: Normocephalic and atraumatic.     Mouth/Throat:     Mouth: Mucous membranes are moist.  Eyes:     General: No scleral icterus. Cardiovascular:     Rate and Rhythm: Regular rhythm. Tachycardia present.     Pulses: Normal pulses.     Heart sounds: Normal heart sounds.  Pulmonary:     Effort: Pulmonary effort is normal.     Breath sounds: Normal breath sounds.  Abdominal:     General: Abdomen is flat.      Palpations: Abdomen is soft.     Tenderness: There is no abdominal tenderness.  Musculoskeletal:        General: No deformity.  Skin:    General: Skin is warm.     Findings: No rash.  Neurological:     General: No focal deficit present.     Mental Status: She is alert.  Psychiatric:        Mood and Affect: Mood normal.     Comments: Patient appears anxious.  Slow to respond to questions.  A&O x4.     ED Results / Procedures / Treatments   Labs (all labs ordered are listed, but only abnormal results are displayed) Labs Reviewed  BASIC METABOLIC PANEL - Abnormal; Notable for the following components:      Result Value   BUN 93 (*)    Creatinine, Ser 11.99 (*)    GFR, Estimated 4 (*)    Anion gap 16 (*)    All other components within normal limits  CBC - Abnormal; Notable for the following components:   WBC 11.0 (*)    RBC 2.88 (*)    Hemoglobin 8.9 (*)    HCT 28.1 (*)    RDW 19.3 (*)    All other components within normal limits  TROPONIN I (HIGH SENSITIVITY) - Abnormal; Notable for the following components:   Troponin I (High Sensitivity) 718 (*)    All other components within normal limits  TROPONIN I (HIGH SENSITIVITY) - Abnormal; Notable for the following components:   Troponin I (High Sensitivity) 717 (*)    All other components within normal limits    EKG EKG Interpretation  Date/Time:  Saturday October 03 2022 20:31:39 EDT Ventricular Rate:  109 PR Interval:  122 QRS Duration: 102 QT Interval:  324 QTC Calculation: 436 R Axis:   -34 Text Interpretation: Sinus tachycardia Left axis deviation Anterior infarct , age undetermined Abnormal ECG When compared with ECG of 29-Sep-2022 13:06, PREVIOUS ECG IS PRESENT Confirmed by Gilda Crease [phone]) on 10/03/2022 11:04:36 PM  Radiology DG Chest 2 View  Result Date: 10/03/2022 CLINICAL DATA:  Chest pain and syncope. EXAM: CHEST - 2 VIEW COMPARISON:  September 29, 2022 FINDINGS: The cardiac silhouette is  moderately enlarged and unchanged in size. Mild areas of atelectasis and/or infiltrate are seen within the bilateral lung bases. Small to moderate-sized bilateral pleural effusions are seen. No pneumothorax is identified. The visualized skeletal structures are unremarkable. IMPRESSION: 1. Stable cardiomegaly. 2. Mild bibasilar atelectasis and/or infiltrate. 3. Small to moderate-sized bilateral pleural effusions. Electronically Signed   By: Aram Candela M.D.   On: 10/03/2022 21:16    Procedures Procedures    Medications Ordered in ED Medications - No data to display  ED Course/ Medical Decision Making/ A&P  Medical Decision Making Amount and/or Complexity of Data Reviewed Labs: ordered.  Risk Prescription drug management. Decision regarding hospitalization.   This patient presents to the ED for dizziness, chest pain, shortness of breath, this involves an extensive number of treatment options, and is a complaint that carries with a high risk of complications and morbidity.  The differential diagnosis includes ACS, overdose, cocaine use, infectious etiology.  This is not an exhaustive list.  Comorbidities that complicate the patient evaluation See HPI  Social determinants of health NA  Additional history obtained: Additional history obtained from EMR. External records from outside source obtained and review including prior labs  Cardiac monitoring/EKG: The patient was maintained on a cardiac monitor.  I personally reviewed and interpreted the cardiac monitor which showed an underlying rhythm of: Sinus tachy.  Lab tests: I ordered and personally interpreted labs.  The pertinent results include Leukocytosis 11. Hbg normal. Platelets normal. No electrolyte abnormalities noted. BUN, creatinine consistent with end-stage renal disease.. No transaminitis.  Imaging studies: I ordered imaging studies including CT head and chest x-ray which showed  persistent findings of CHF with small to moderate bilateral lower effusions and associated bibasilar opacities.  No evidence of acute intracranial abnormalities. I personally reviewed, interpreted imaging and agree with the radiologist's interpretations.  Problem list/ ED course/ Critical interventions/ Medical management: HPI: See above Vital signs significant for BP 160/107.  Otherwise within normal range and stable throughout visit. Laboratory/imaging studies significant for: See above. On physical examination, patient is afebrile and appears in no acute distress.  Patient appears anxious and slow to respond to questions on exam.  She is alert and oriented x4.  She missed dialysis on Saturday.  She left AMA before dialysis.  Potassium level is normal today.  She still having chest pain and shortness of breath on my exam.  Nitro given for chest pain.  Troponin elevated in the 700s.  Chest x-ray show small to moderate bilateral effusions with associated bibasilar opacities.  Patient's clinical presentations and laboratory/imaging studies are most concerned for ESRD, cocaine use.  Cannot completely rule out ACS as troponin is elevated in the 700s.  However EKG is more consistent with history of cocaine use.  Patient states she would like to pursue rehab.  She is having difficulty being placed in rehab says that with only 1 center for dialysis patient.  TOC team might be involved during patient's hospitalization. I have reviewed the patient home medicines and have made adjustments as needed.  Consultations obtained: I requested consultation with hospitalist Dr.Zhang, and discussed lab and imaging findings as well as pertinent plan.  They recommend admission  Disposition Patient is admitted to the hospital.  This chart was dictated using voice recognition software.  Despite best efforts to proofread,  errors can occur which can change the documentation meaning.          Final Clinical  Impression(s) / ED Diagnoses Final diagnoses:  Dizziness  Lightheadedness  Other chest pain    Rx / DC Orders ED Discharge Orders     None         Jeanelle Malling, [place] 10/04/22 2057    Gwyneth Sprout, MD 10/07/22 [phone]
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
## 2022-10-04 NOTE — Progress Notes (Signed)
Natasha Long Progress Note   Subjective: pt recently admitted from 10/31 to 10/02/22 for missed HD w/ hyperkalemia, TME, uncont HTN and ESRD w/ vol overload. Pt was somnolent but after 1st dialysis (10/31) this improved moderately and after the 2nd HD (11/02) pt signed out AMA. She was notified that since she hadn't been to her old HD unit ([name]) since moving back to [place] from [place], that she is no longer a patient there. We would have done the paperwork to get her back into her HD unit here but she signed out AMA.  Now pt is returning to ED brought by EMS from local gas [place] w/ c/o dizziness after smoking crack. Last HD was here on 11/02 as above. Asked to see for dialysis.   Objective Vitals:   10/04/22 1643 10/04/22 1648 10/04/22 1742 10/04/22 1800  BP: (!) 158/94  (!) 160/107 (!) 148/92  Pulse: (!) 111  (!) 106 98  Resp: 16   18  Temp:  98 F (36.7 C)    TempSrc:  Oral    SpO2: 99%  93% 92%  Weight:      Height:       Physical Exam General: Chronically ill appearing female, somnolent, not waking up to voice Heart: S1,S2 RRR no rub Lungs: mostly clear, no wheezing Abdomen: NABS, NT,ND Extremities: 1-2+ bilat pretib edema, no other edema Dialysis Access: L AVF + T/B   OP HD: [facility] TTS  usual HD here  >> 3.5h  350/1.5  2/2.5 bath  Hep 5000 L AVF   Assessment/ Plan: Dizziness - pt is somnolent in ED. Was given Ativan which is now dc'd. I have dc'd the gabapentin as she is excessively somnolent and will not benefit from gabapentin at this time.  Uremia - due to missed HD. Pt is homeless. Also does not have a HD unit at this time, hopefully she can be re-enrolled while here.  ESRD - does not have an OP HD unit. HD tomorrow am.  Homelessness - a driving factor HTN/ vol overload - not severe, BP lowering meds by pmd. Get vol down w/ HD.  H/o substance abuse    Kelly Splinter, MD 10/04/2022, 8:22 PM  Recent Labs  Lab 09/30/22 0035 09/30/22 0356  10/01/22 0311 10/01/22 1013 10/03/22 2049 10/04/22 1547  HGB 7.9*  --  8.1*  --  8.9*  --   ALBUMIN 2.7*  --  2.6*  --   --   --   CALCIUM 9.5   < > 8.4*  --  9.3 8.9  CREATININE 13.45*   < > 14.64*  --  11.99* 12.55*  K 5.3*   < > 4.4   < > 4.5 4.7   < > = values in this interval not displayed.    Inpatient medications:  ARIPiprazole  7.5 mg Oral Daily   aspirin EC  81 mg Oral Daily   calcium acetate  667 mg Oral TID WC   furosemide  80 mg Intravenous BID   gabapentin  300 mg Oral BID   heparin  5,000 Units Subcutaneous Q12H   labetalol  10 mg Intravenous Q6H   LORazepam  1 mg Intravenous Q6H   [START ON 10/05/2022] losartan  50 mg Oral Daily   sevelamer carbonate  1,600 mg Oral TID WC   sodium chloride flush  3 mL Intravenous Q12H   traZODone  100 mg Oral QHS    sodium chloride     sodium chloride,  acetaminophen, dicyclomine, hydrOXYzine, methocarbamol, nitroGLYCERIN, ondansetron (ZOFRAN) IV, sodium chloride flush

## 2022-10-04 NOTE — TOC Initial Note (Addendum)
Transition of Care [facility]) - Initial/Assessment Note    Patient Details  Name: Manette Doto MRN: [id number] Date of Birth: 23-Jul-1996  Transition of Care [facility]) CM/SW Contact:    Verdell Carmine, RN Phone Number: 10/04/2022, 11:13 AM  Clinical Narrative:                  Patient presents with dizziness after crack cocaineuse. Patient left the hospital AMA on 11/3 before she was accepted by a dialysis center. Creatinine 11. The patient is homeless and lists her address as the [facility].  TOC and renal navigator will follow for needs, recommendations, and transitions of Care Renal Navigator e-mailed securely to let her know of admission.   Barriers to Discharge: Homeless with medical needs, Inadequate or no insurance   Patient Goals and CMS Choice        Expected Discharge Plan and Services     Discharge Planning Services: Other - See comment (needs dialysis center acceptance)   Living arrangements for the past 2 months: Homeless                                      Prior Living Arrangements/Services Living arrangements for the past 2 months: Homeless Lives with:: Self                   Activities of Daily Living      Permission Sought/Granted                  Emotional Assessment              Admission diagnosis:  Dizziness, Hypotension Patient Active Problem List   Diagnosis Date Noted   Hyperkalemia, diminished renal excretion 31/54/0086   Toxic metabolic encephalopathy 76/19/5093   Back pain 08/31/2022   Homelessness 08/31/2022   Mitral valve insufficiency    Pleural effusion    Abnormal chest x-ray 08/24/2022   Heart failure with reduced ejection fraction ([place]) 08/24/2022   Acute on chronic systolic heart failure (HCC)    Volume overload 06/30/2022   Community acquired pneumonia 26/71/2458   Metabolic acidosis 09/98/3382   Acute kidney injury superimposed on CKD ([place]) 06/26/2022   Uremia of renal origin 05/15/2022   High risk  sexual behavior 05/15/2022   Polysubstance abuse ([place]) 05/15/2022   Lip lesion 05/06/2022   Vaginal itching 05/06/2022   Left ankle pain 05/06/2022   Contamination of blood culture 05/06/2022   Uremia    Pericardial effusion without cardiac tamponade    Altered mental status 05/03/2022   Syphilis    Pain management    Septic arthritis of multiple joints ([place]) 02/01/2022   Group G streptococcal infection 02/01/2022   Left wrist pain    Hyperkalemia 01/30/2022   Effusion of right knee    Arthritis, septic [facility])    Dialysis patient, noncompliant    Lower leg pain 01/29/2022   Hypokalemia 01/29/2022   Hyperphosphatemia 01/29/2022   Hypervolemia associated with renal insufficiency 12/23/2021   Elevated troponin 12/23/2021   ESRD on dialysis ([place]) 12/04/2021   Tobacco use 08/11/2021   Nipple discharge 08/11/2021   Breast pain 08/11/2021   Exposure to sexually transmitted disease (STD) 50/53/9767   Complication of vascular dialysis catheter 02/12/2021   Moderate protein-calorie malnutrition ([place]) 01/27/2021   Allergy, unspecified, initial encounter 01/23/2021   Anemia in chronic kidney disease 01/23/2021   End stage renal disease ([place]) 01/23/2021  Iron deficiency anemia, unspecified 01/23/2021   Other specified coagulation defects ([place]) 01/23/2021   Secondary hyperparathyroidism of renal origin ([place]) 01/23/2021   Anemia    Suicidal ideations    Influenza vaccine refused 12/19/2020   Morbid obesity ([place]) 01/30/2020   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   Cocaine use, 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[place]) 01/30/2020   Mood disorder ([place]) 06/27/2019   Borderline personality disorder ([place]) 11/16/2018   Moderate cannabis use disorder ([place]) 11/16/2018   Severe recurrent major depression without psychotic features ([place]) 11/15/2018   Chronic hypertension 04/04/2018   PTSD (post-traumatic stress disorder) 04/01/2018   PCP:  [name], MD Pharmacy:    [facility], [place] - [address] [place] [address] Phone: [phone] Fax: [place] [address] [place] [address] Phone: [phone] Fax: [phone]     Social Determinants of Health (SDOH) Interventions    Readmission Risk Interventions    06/29/2022   11:17 AM  Readmission Risk Prevention Plan  Transportation Screening Complete  Medication Review (RN Care Manager) Complete  PCP or Specialist appointment within 3-5 days of discharge Complete  HRI or [place] Complete  SW Recovery Care/Counseling Consult Complete  [place] Not Applicable

## 2022-10-05 ENCOUNTER — Other Ambulatory Visit (HOSPITAL_COMMUNITY): Payer: Medicaid Other

## 2022-10-05 DIAGNOSIS — I5023 Acute on chronic systolic (congestive) heart failure: Secondary | ICD-10-CM | POA: Diagnosis not present

## 2022-10-05 LAB — BASIC METABOLIC PANEL
Anion gap: 15 (ref 5–15)
BUN: 108 mg/dL — ABNORMAL HIGH (ref 6–20)
CO2: 20 mmol/L — ABNORMAL LOW (ref 22–32)
Calcium: 8.9 mg/dL (ref 8.9–10.3)
Chloride: 100 mmol/L (ref 98–111)
Creatinine, Ser: 13.22 mg/dL — ABNORMAL HIGH (ref 0.44–1.00)
GFR, Estimated: 4 mL/min — ABNORMAL LOW (ref >60)
Glucose, Bld: 123 mg/dL — ABNORMAL HIGH (ref 70–99)
Potassium: 4.8 mmol/L (ref 3.5–5.1)
Sodium: 135 mmol/L (ref 135–145)

## 2022-10-05 LAB — PREGNANCY, URINE: Preg Test, Ur: NEGATIVE

## 2022-10-05 MED ORDER — HEPARIN SODIUM (PORCINE) 1000 UNIT/ML DIALYSIS: 2500.0000 [IU] | INTRAMUSCULAR | Status: DC | PRN

## 2022-10-05 MED ORDER — KETOROLAC TROMETHAMINE 15 MG/ML IJ SOLN
15.0000 mg | INTRAMUSCULAR | Status: AC
  Administered 2022-10-05: 15 mg via INTRAVENOUS
  Filled 2022-10-05: qty 1

## 2022-10-05 MED ORDER — HEPARIN SODIUM (PORCINE) 1000 UNIT/ML IJ SOLN
INTRAMUSCULAR | Status: AC
  Administered 2022-10-05: 2500 [IU]
  Filled 2022-10-05: qty 3

## 2022-10-05 NOTE — Progress Notes (Addendum)
Pt known to navigator. Advised pt came back to ED this weekend after leaving on Friday. [facility] M[facility] 10:15 chair time. Pt can start as soon as Wednesday and will need to arrive at 9:15 for first appt to complete paperwork prior to treatment. Met with pt at bedside while pt on HD unit. Pt advised of out-pt HD arrangements and schedule letter provided to pt as well. Pt agreeable to clinic arrangements and voiced interest in possible substance abuse rehab vs AL placement at d/c. [name] interests shared with ED CSW as well as pt's new out-pt clinic schedule. CSW also advised that pt will likely require assistance with transportation arrangements to HD as well. Will add out-pt HD appts to AVS.Update provided to nephrologist as well. Will assist as needed.   Melven Sartorius Renal Navigator [phone]

## 2022-10-05 NOTE — Procedures (Signed)
Patient seen on Hemodialysis. BP 132/82 (BP Location: Left Arm)   Pulse (!) 107   Temp 98 F (36.7 C) (Oral)   Resp (!) 22   Ht 5\' 8"  (1.727 m)   Wt 96.6 kg   LMP  (LMP Unknown) Comment: Patient notes she is not having periods (10/04/22)  SpO2 97%   BMI 32.38 kg/m   QB 400, UF goal 3.5L Tolerating treatment without complaints at this time.   Natasha Shiley MD [facility]. Office # [phone] Pager # [phone] 10:23 AM

## 2022-10-05 NOTE — Progress Notes (Signed)
PROGRESS NOTE    Natasha Long  CBS:[id number] DOB: 07/14/1996 DOA: 10/03/2022 PCP: [name], MD   Brief Narrative:  26 year old female with history of chronic systolic heart failure with LVEF of 40 to 45%, severe mitral regurgitation, end-stage renal disease on hemodialysis, noncompliance with chronic nonadherence and has been missing appointments/hemodialysis schedule multiple recent hospitalizations including the last one from 09/29/2022-10/02/2022 but patient left and has medical advice, hypertension, polysubstance abuse, PTSD, depression was found by bystanders in a gas station complaining of dizziness.  She had also smoked crack cocaine a day prior to presentation.  On presentation, she was lethargic, tachypneic and tachycardic with elevated blood pressures.  Chest x-ray showed bilateral pleural effusion and pulmonary congestion.  Troponins were 718, 717 and 707.  Nephrology was consulted.  Assessment & Plan:   Volume overload End-stage renal disease on hemodialysis Acute on chronic systolic heart failure Noncompliance with HD and medications Polysubstance/cocaine use Homelessness -Patient had recently signed out AMA on 10/02/2022.  Outpatient HD unit was being arranged at the time. -Nephrology following.  Currently undergoing dialysis.  Dialysis as per nephrology schedule.  Currently on Lasix 80 g IV twice daily -Counseled regarding compliance and abstinence from cocaine -Consult TOC.  Patient needs to have an outpatient hemodialysis unit before she can be discharged. -Strict input and output, daily weights, fluid restriction.  Acute toxic encephalopathy -Possibly due to cocaine use and worsening uremia.  CT of the head without contrast was negative for any acute intracranial abnormality -Patient was very drowsy on presentation and had received Ativan as well. -Ativan and gabapentin have been discontinued. -Mental status back to baseline and more awake this morning:  Asking for IV Atarax.  Told the patient that IV Atarax will not be given.  Atarax 25 mg every 8 hours as needed can be given for anxiety.  Hypertension -Blood pressure still on the high side but improving.  Currently on IV labetalol and IV Lasix.  Continue losartan  Elevated troponins -Possibly from demand ischemia from volume overload. -Latest troponin 585 and trending down. -Currently on aspirin 81 mg daily.  Echocardiogram ordered by admitting hospitalist  Chronic pericardial effusion -Possibly due to end-stage renal disease.  Follow echocardiogram.  Management by dialysis.  Obesity -outpatient follow-up   DVT prophylaxis: Heparin subcutaneous Code Status: Full Family Communication: None at bedside Disposition Plan: Status is: Inpatient Remains inpatient appropriate because: Of severity of illness  Consultants: Nephrology  Procedures: None  Antimicrobials: None   Subjective: Patient seen and examined at bedside undergoing hemodialysis.  Feels anxious and is asking for IV Atarax.  Subsequently asked for Atarax 75 mg dose.  No seizures, fever or vomiting reported by nursing staff.  Objective: Vitals:   10/05/22 0830 10/05/22 0900 10/05/22 0933 10/05/22 1000  BP: (!) 155/106 (!) 154/104 (!) 161/95 132/82  Pulse: (!) 107 (!) 105 (!) 105 (!) 107  Resp: 19 (!) 23 (!) 26 (!) 22  Temp:      TempSrc:      SpO2: 100% 98% 95% 97%  Weight:      Height:       No intake or output data in the 24 hours ending 10/05/22 1012 Filed Weights   10/04/22 1135 10/05/22 0819  Weight: 92.6 kg 96.6 kg    Examination:  General exam: Appears calm and comfortable.  Looks chronically ill and deconditioned.  Currently on room air. Respiratory system: Bilateral decreased breath sounds at bases with scattered crackles and intermittent tachypnea Cardiovascular system: S1 & S2  heard, tachycardic  gastrointestinal system: Abdomen is obese, nondistended, soft and nontender. Normal bowel sounds  heard. Extremities: No cyanosis, clubbing; bilateral lower extremity edema present Central nervous system: Alert and oriented. No focal neurological deficits. Moving extremities Skin: No rashes, lesions or ulcers Psychiatry: Affect is mostly flat.  No signs of agitation.    Data Reviewed: I have personally reviewed following labs and imaging studies  CBC: Recent Labs  Lab 09/29/22 0542 09/29/22 1259 09/30/22 0035 10/01/22 0311 10/03/22 2049  WBC 13.4*  --  9.8 9.4 11.0*  NEUTROABS 11.1*  --  8.4*  --   --   HGB 8.9* 8.8* 7.9* 8.1* 8.9*  HCT 29.0* 26.0* 24.1* 24.5* 28.1*  MCV 101.0*  --  94.5 94.6 97.6  PLT 289  --  226 212 774   Basic Metabolic Panel: Recent Labs  Lab 09/30/22 0356 09/30/22 1020 10/01/22 0311 10/01/22 1013 10/03/22 2049 10/04/22 1547 10/05/22 0345  NA 137  --  137  --  139 138 135  K 5.3*   < > 4.4 4.5 4.5 4.7 4.8  CL 97*  --  97*  --  99 97* 100  CO2 20*  --  24  --  24 23 20*  GLUCOSE 92  --  156*  --  90 112* 123*  BUN 104*  --  115*  --  93* 105* 108*  CREATININE 13.47*  --  14.64*  --  11.99* 12.55* 13.22*  CALCIUM 9.6  --  8.4*  --  9.3 8.9 8.9  MG  --   --  2.4  --   --   --   --    < > = values in this interval not displayed.   GFR: Estimated Creatinine Clearance: 7.7 mL/min (A) (by C-G formula based on SCr of 13.22 mg/dL (H)). Liver Function Tests: Recent Labs  Lab 09/29/22 0542 09/30/22 0035 10/01/22 0311  AST 84* 60* 42*  ALT 57* 46* 40  ALKPHOS 94 78 74  BILITOT 1.0 1.0 0.8  PROT 8.0 6.2* 6.1*  ALBUMIN 3.5 2.7* 2.6*   No results for input(s): "LIPASE", "AMYLASE" in the last 168 hours. No results for input(s): "AMMONIA" in the last 168 hours. Coagulation Profile: No results for input(s): "INR", "PROTIME" in the last 168 hours. Cardiac Enzymes: Recent Labs  Lab 10/04/22 1547  CKTOTAL 180   BNP (last 3 results) No results for input(s): "PROBNP" in the last 8760 hours. HbA1C: No results for input(s): "HGBA1C" in the  last 72 hours. CBG: Recent Labs  Lab 09/30/22 1142 09/30/22 2038 10/01/22 0635 10/01/22 0820 10/02/22 0814  GLUCAP 103* 131* 141* 168* 131*   Lipid Profile: Recent Labs    10/04/22 1547  CHOL 109  HDL 54  LDLCALC 42  TRIG 66  CHOLHDL 2.0   Thyroid Function Tests: No results for input(s): "TSH", "T4TOTAL", "FREET4", "T3FREE", "THYROIDAB" in the last 72 hours. Anemia Panel: No results for input(s): "VITAMINB12", "FOLATE", "FERRITIN", "TIBC", "IRON", "RETICCTPCT" in the last 72 hours. Sepsis Labs: No results for input(s): "PROCALCITON", "LATICACIDVEN" in the last 168 hours.  No results found for this or any previous visit (from the past 240 hour(s)).       Radiology Studies: CT HEAD WO CONTRAST (5MM)  Result Date: 10/04/2022 CLINICAL DATA:  Mental status change, unknown cause EXAM: CT HEAD WITHOUT CONTRAST TECHNIQUE: Contiguous axial images were obtained from the base of the skull through the vertex without intravenous contrast. RADIATION DOSE REDUCTION: This exam was performed  according to the departmental dose-optimization program which includes automated exposure control, adjustment of the mA and/or kV according to patient size and/or use of iterative reconstruction technique. COMPARISON:  09/29/2022 FINDINGS: Brain: No evidence of acute infarction, hemorrhage, hydrocephalus, extra-axial collection or mass lesion/mass effect. Vascular: No hyperdense vessel or unexpected calcification. Skull: Normal. Negative for fracture or focal lesion. Sinuses/Orbits: No acute finding. Other: None. IMPRESSION: No acute intracranial abnormality. Electronically Signed   By: Davina Poke D.O.   On: 10/04/2022 17:53   DG Chest 1 View  Result Date: 10/04/2022 CLINICAL DATA:  Congestive heart failure EXAM: CHEST  1 VIEW COMPARISON:  10/03/2022 FINDINGS: Stable cardiomegaly and pulmonary vascular congestion. Small to moderate bilateral pleural effusions with associated bibasilar opacities. No  pneumothorax. IMPRESSION: Persistent findings of CHF with small to moderate bilateral pleural effusions and associated bibasilar opacities. Electronically Signed   By: Davina Poke D.O.   On: 10/04/2022 16:00   DG Chest 2 View  Result Date: 10/03/2022 CLINICAL DATA:  Chest pain and syncope. EXAM: CHEST - 2 VIEW COMPARISON:  September 29, 2022 FINDINGS: The cardiac silhouette is moderately enlarged and unchanged in size. Mild areas of atelectasis and/or infiltrate are seen within the bilateral lung bases. Small to moderate-sized bilateral pleural effusions are seen. No pneumothorax is identified. The visualized skeletal structures are unremarkable. IMPRESSION: 1. Stable cardiomegaly. 2. Mild bibasilar atelectasis and/or infiltrate. 3. Small to moderate-sized bilateral pleural effusions. Electronically Signed   By: Virgina Norfolk M.D.   On: 10/03/2022 21:16        Scheduled Meds:  ARIPiprazole  7.5 mg Oral Daily   aspirin EC  81 mg Oral Daily   calcium acetate  667 mg Oral TID WC   Chlorhexidine Gluconate Cloth  6 each Topical Q0600   furosemide  80 mg Intravenous BID   heparin  5,000 Units Subcutaneous Q12H   labetalol  10 mg Intravenous Q6H   losartan  50 mg Oral Daily   sevelamer carbonate  1,600 mg Oral TID WC   sodium chloride flush  3 mL Intravenous Q12H   traZODone  100 mg Oral QHS   Continuous Infusions:  sodium chloride            Aline August, MD [facility] 10/05/2022, 10:12 AM

## 2022-10-05 NOTE — ED Notes (Signed)
Dialysis gave report. Pt did well at dialysis.  Pt had 3.5 liters removed over 3.5 hr's

## 2022-10-05 NOTE — ED Notes (Signed)
Patient transported to dialysis

## 2022-10-05 NOTE — Progress Notes (Signed)
Received patient earlier,before the start of her HD treatment.Alert and oriented x 4.No complaint,not in pain .Very pleasant at this time.PMD at the bedside.  Access used: Left upper arm fistula -works well during treatment.  Medicines given;1. Heparin 2,500 units bolus during treatment as per prn ordered.                          2. Vistaril 25 mg po for anxiety.                          3. Tylenol 650 mg -headache.  HD treatment issue:None  Pre HD weight;96.6 kg -standing wt.  Post Hd  weight:93.5 kg -standing wt.  Fluid removed:3.5 Liters  Treatment duration:3.5 hours  Hands off to E.D  nurse.

## 2022-10-05 NOTE — Progress Notes (Signed)
Patient arrived at the unit,CCMD notified,Vitals checked.patient refused complete CHG bath.Care continues.
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
## 2022-10-05 NOTE — Progress Notes (Signed)
Heart Failure Navigator Progress Note  Assessed for Heart & Vascular TOC clinic readiness.  Patient does not meet criteria due to ESRD patient on Hemodialysis.     Earnestine Leys, BSN, Clinical cytogeneticist Only

## 2022-10-06 ENCOUNTER — Inpatient Hospital Stay (HOSPITAL_COMMUNITY): Payer: Medicaid Other

## 2022-10-06 DIAGNOSIS — R7989 Other specified abnormal findings of blood chemistry: Secondary | ICD-10-CM | POA: Diagnosis not present

## 2022-10-06 DIAGNOSIS — N186 End stage renal disease: Secondary | ICD-10-CM | POA: Diagnosis not present

## 2022-10-06 DIAGNOSIS — Z5900 Homelessness unspecified: Secondary | ICD-10-CM

## 2022-10-06 DIAGNOSIS — G928 Other toxic encephalopathy: Secondary | ICD-10-CM

## 2022-10-06 DIAGNOSIS — I5023 Acute on chronic systolic (congestive) heart failure: Secondary | ICD-10-CM | POA: Diagnosis not present

## 2022-10-06 DIAGNOSIS — F1910 Other psychoactive substance abuse, uncomplicated: Secondary | ICD-10-CM

## 2022-10-06 DIAGNOSIS — I3139 Other pericardial effusion (noninflammatory): Secondary | ICD-10-CM

## 2022-10-06 DIAGNOSIS — Z992 Dependence on renal dialysis: Secondary | ICD-10-CM

## 2022-10-06 DIAGNOSIS — E8770 Fluid overload, unspecified: Secondary | ICD-10-CM

## 2022-10-06 LAB — HEPATITIS B SURFACE ANTIGEN: Hepatitis B Surface Ag: NONREACTIVE

## 2022-10-06 LAB — BASIC METABOLIC PANEL
Anion gap: 18 — ABNORMAL HIGH (ref 5–15)
BUN: 76 mg/dL — ABNORMAL HIGH (ref 6–20)
CO2: 24 mmol/L (ref 22–32)
Calcium: 9.5 mg/dL (ref 8.9–10.3)
Chloride: 96 mmol/L — ABNORMAL LOW (ref 98–111)
Creatinine, Ser: 9.7 mg/dL — ABNORMAL HIGH (ref 0.44–1.00)
GFR, Estimated: 5 mL/min — ABNORMAL LOW (ref >60)
Glucose, Bld: 105 mg/dL — ABNORMAL HIGH (ref 70–99)
Potassium: 4.5 mmol/L (ref 3.5–5.1)
Sodium: 138 mmol/L (ref 135–145)

## 2022-10-06 LAB — PHOSPHORUS: Phosphorus: 7.2 mg/dL — ABNORMAL HIGH (ref 2.5–4.6)

## 2022-10-06 MED ORDER — DARBEPOETIN ALFA 200 MCG/0.4ML IJ SOSY
200.0000 ug | PREFILLED_SYRINGE | INTRAMUSCULAR | Status: DC
  Filled 2022-10-06: qty 0.4

## 2022-10-06 MED ORDER — METOPROLOL TARTRATE 12.5 MG HALF TABLET
12.5000 mg | ORAL_TABLET | Freq: Two times a day (BID) | ORAL | Status: DC
  Administered 2022-10-06: 12.5 mg via ORAL
  Filled 2022-10-06: qty 1

## 2022-10-06 NOTE — Progress Notes (Signed)
Contacted attending, RN CM, and CSW this morning via secure chat to provide update regarding pt's approval for [facility] at d/c (see note from yesterday for details). Staff also advised of pt's requests for assistance with d/c planning needs. Will assist as needed.   Melven Sartorius Renal Navigator [phone]

## 2022-10-06 NOTE — Progress Notes (Signed)
Patient was mildly lethargic this am and easily aggitated. Patient has refused all morning care and stated, "just leave me alone".

## 2022-10-06 NOTE — Progress Notes (Signed)
Patient ID: Natasha Long, female   DOB: 25-Aug-1996, 26 y.o.   MRN: [id number] [facility] Progress Note   Assessment/ Plan:   1.  Dizziness/altered mental status: Unclear if this is entirely pharmacological with pattern of waxing and waning mentation noted along with explicit refusal of procedures/care.  In part suspected to have been from uremia secondary to missed hemodialysis treatments along with substance abuse (cocaine).  CT head negative for acute pathology. 2. ESRD: While here in the hospital, continuing a Monday/Wednesday/Friday dialysis schedule (to which she has been accepted at the [facility] on the second shift starting as soon as tomorrow 10/07/2022).  Case management/social worker assisting with setting up transportation and getting her connected to resources for substance abuse rehabilitation. 3. Anemia: Low hemoglobin/hematocrit noted likely associated with lack of dialysis/ESA. 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 5.  Hypertension: Blood pressures under decent control with antihypertensive therapy and hemodialysis/UF. 6.  Nutrition: Continue renal diet as permitted by her resources with oral nutritional supplementation if indicated.  Subjective:   I was alerted yesterday by the renal navigator that the patient was accepted to [facility] and can start as early as Wednesday, 10/07/2022.  Overnight notes reviewed and are significant for the patient refusing complete CHG bath/2D echocardiogram.   Objective:   BP (!) 141/80 (BP Location: Right Arm)   Pulse 92   Temp 98.4 F (36.9 C) (Oral)   Resp 18   Ht 5\' 8"  (1.727 m)   Wt 93.5 kg   LMP  (LMP Unknown) Comment: Patient notes she is not having periods (10/04/22)  SpO2 98%   BMI 31.34 kg/m   Physical Exam: Gen: Somnolent and resting comfortably in bed, awakens briefly to acknowledge my presence but does  not answer questions. CVS: Pulse regular rhythm, normal rate, S1 and S2 normal Resp: Transmitted breath sounds bilaterally without distinct rales or rhonchi Abd: Soft, obese, nontender, bowel sounds normal Ext: Trace ankle edema, left upper arm AV fistula with palpable thrill  Labs: BMET Recent Labs  Lab 09/30/22 0035 09/30/22 0356 09/30/22 1020 09/30/22 2130 10/01/22 0311 10/01/22 1013 10/03/22 2049 10/04/22 1547 10/05/22 0345 10/06/22 0525  NA 136 137  --   --  137  --  139 138 135 138  K 5.3* 5.3*   < > 4.8 4.4 4.5 4.5 4.7 4.8 4.5  CL 95* 97*  --   --  97*  --  99 97* 100 96*  CO2 19* 20*  --   --  24  --  24 23 20* 24  GLUCOSE 108* 92  --   --  156*  --  90 112* 123* 105*  BUN 107* 104*  --   --  115*  --  93* 105* 108* 76*  CREATININE 13.45* 13.47*  --   --  14.64*  --  11.99* 12.55* 13.22* 9.70*  CALCIUM 9.5 9.6  --   --  8.4*  --  9.3 8.9 8.9 9.5   < > = values in this interval not displayed.   CBC Recent Labs  Lab 09/29/22 1259 09/30/22 0035 10/01/22 0311 10/03/22 2049  WBC  --  9.8 9.4 11.0*  NEUTROABS  --  8.4*  --   --   HGB 8.8* 7.9* 8.1* 8.9*  HCT 26.0* 24.1* 24.5* 28.1*  MCV  --  94.5 94.6 97.6  PLT  --  226 212  180    Medications:     ARIPiprazole  7.5 mg Oral Daily   calcium acetate  667 mg Oral TID WC   Chlorhexidine Gluconate Cloth  6 each Topical Q0600   furosemide  80 mg Intravenous BID   labetalol  10 mg Intravenous Q6H   losartan  50 mg Oral Daily   sevelamer carbonate  1,600 mg Oral TID WC   sodium chloride flush  3 mL Intravenous Q12H   traZODone  100 mg Oral QHS   Elmarie Shiley, MD 10/06/2022, 8:51 AM

## 2022-10-06 NOTE — Progress Notes (Addendum)
PROGRESS NOTE    Natasha Long  KDX:[id number] DOB: 01-22-1996 DOA: 10/03/2022 PCP: [name], MD   Brief Narrative:  26 year old female with history of chronic systolic heart failure with LVEF of 40 to 45%, severe mitral regurgitation, end-stage renal disease on hemodialysis, noncompliance with chronic nonadherence and has been missing appointments/hemodialysis schedule multiple recent hospitalizations including the last one from 09/29/2022-10/02/2022 but patient left and has medical advice, hypertension, polysubstance abuse, PTSD, depression was found by bystanders in a gas station complaining of dizziness.  She had also smoked crack cocaine a day prior to presentation.  On presentation, she was lethargic, tachypneic and tachycardic with elevated blood pressures.  Chest x-ray showed bilateral pleural effusion and pulmonary congestion.  Troponins were 718, 717 and 707.  Nephrology was consulted.  Assessment & Plan:   Volume overload End-stage renal disease on hemodialysis Acute on chronic systolic heart failure Noncompliance with HD and medications Polysubstance/cocaine use Homelessness -Patient had recently signed out AMA on 10/02/2022.  Outpatient HD unit was being arranged at the time. -Nephrology following.  Currently undergoing dialysis.  Dialysis as per nephrology schedule.  Currently on Lasix 80 g IV twice daily -Counseled regarding compliance and abstinence from cocaine -Consult TOC.  Patient needs to have an outpatient hemodialysis unit before she can be discharged. -Strict input and output, daily weights, fluid restriction.  Acute toxic encephalopathy -Possibly due to cocaine use and worsening uremia.  CT of the head without contrast was negative for any acute intracranial abnormality -Patient was very drowsy on presentation and had received Ativan as well. -Ativan and gabapentin have been discontinued. -Mental status back to baseline.   Hypertension -Blood pressure  still on the high side but improving.  Currently on IV labetalol and IV Lasix.  Continue losartan  Elevated troponins -Possibly from demand ischemia from volume overload. -Last troponin was 585 and trending down. -Currently on aspirin 81 mg daily.  Echocardiogram ordered by admitting hospitalist still pending.  Chronic pericardial effusion -Possibly due to end-stage renal disease.  Follow echocardiogram.  Management by dialysis.  Obesity -outpatient follow-up   DVT prophylaxis: Heparin subcutaneous held from 10/05/2022 due to some epistaxis Code Status: Full Family Communication: None at bedside Disposition Plan: Status is: Inpatient Remains inpatient appropriate because: Of severity of illness  Consultants: Nephrology  Procedures: None  Antimicrobials: None   Subjective: Patient seen and examined at bedside.  Poor historian.  No worsening shortness of breath, fever, agitation reported. Objective: Vitals:   10/05/22 1550 10/05/22 1656 10/05/22 2036 10/06/22 0539  BP: (!) 154/88 (!) 150/92 (!) 149/90 (!) 155/105  Pulse: (!) 105  (!) 106 99  Resp: 16  20   Temp: 98 F (36.7 C)  98.4 F (36.9 C)   TempSrc: Oral  Oral   SpO2: 98%  97% 100%  Weight:      Height:        Intake/Output Summary (Last 24 hours) at 10/06/2022 0732 Last data filed at 10/05/2022 1800 Gross per 24 hour  Intake 200 ml  Output 3500 ml  Net -3300 ml   Filed Weights   10/04/22 1135 10/05/22 0819 10/05/22 1215  Weight: 92.6 kg 96.6 kg 93.5 kg    Examination:  General: On room air.  No distress.  Looks chronically ill and deconditioned. ENT/neck: No thyromegaly.  JVD is not elevated  respiratory: Decreased breath sounds at bases bilaterally with some crackles; no wheezing  CVS: S1-S2 heard, tachycardic Abdominal: Soft, obese, nontender, slightly distended; no organomegaly, normal bowel sounds  are heard Extremities: Trace lower extremity edema; no cyanosis  CNS: Awake and alert.  Slow to  respond.  Poor historian.  No focal neurologic deficit.  Moves extremities Lymph: No obvious lymphadenopathy Skin: No obvious ecchymosis/lesions  psych: Not agitated.  Flat affect.   Musculoskeletal: No obvious joint swelling/deformity     Data Reviewed: I have personally reviewed following labs and imaging studies  CBC: Recent Labs  Lab 09/29/22 1259 09/30/22 0035 10/01/22 0311 10/03/22 2049  WBC  --  9.8 9.4 11.0*  NEUTROABS  --  8.4*  --   --   HGB 8.8* 7.9* 8.1* 8.9*  HCT 26.0* 24.1* 24.5* 28.1*  MCV  --  94.5 94.6 97.6  PLT  --  226 212 947    Basic Metabolic Panel: Recent Labs  Lab 10/01/22 0311 10/01/22 1013 10/03/22 2049 10/04/22 1547 10/05/22 0345 10/06/22 0525  NA 137  --  139 138 135 138  K 4.4 4.5 4.5 4.7 4.8 4.5  CL 97*  --  99 97* 100 96*  CO2 24  --  24 23 20* 24  GLUCOSE 156*  --  90 112* 123* 105*  BUN 115*  --  93* 105* 108* 76*  CREATININE 14.64*  --  11.99* 12.55* 13.22* 9.70*  CALCIUM 8.4*  --  9.3 8.9 8.9 9.5  MG 2.4  --   --   --   --   --     GFR: Estimated Creatinine Clearance: 10.3 mL/min (A) (by C-G formula based on SCr of 9.7 mg/dL (H)). Liver Function Tests: Recent Labs  Lab 09/30/22 0035 10/01/22 0311  AST 60* 42*  ALT 46* 40  ALKPHOS 78 74  BILITOT 1.0 0.8  PROT 6.2* 6.1*  ALBUMIN 2.7* 2.6*    No results for input(s): "LIPASE", "AMYLASE" in the last 168 hours. No results for input(s): "AMMONIA" in the last 168 hours. Coagulation Profile: No results for input(s): "INR", "PROTIME" in the last 168 hours. Cardiac Enzymes: Recent Labs  Lab 10/04/22 1547  CKTOTAL 180    BNP (last 3 results) No results for input(s): "PROBNP" in the last 8760 hours. HbA1C: No results for input(s): "HGBA1C" in the last 72 hours. CBG: Recent Labs  Lab 09/30/22 1142 09/30/22 2038 10/01/22 0635 10/01/22 0820 10/02/22 0814  GLUCAP 103* 131* 141* 168* 131*    Lipid Profile: Recent Labs    10/04/22 1547  CHOL 109  HDL 54   LDLCALC 42  TRIG 66  CHOLHDL 2.0    Thyroid Function Tests: No results for input(s): "TSH", "T4TOTAL", "FREET4", "T3FREE", "THYROIDAB" in the last 72 hours. Anemia Panel: No results for input(s): "VITAMINB12", "FOLATE", "FERRITIN", "TIBC", "IRON", "RETICCTPCT" in the last 72 hours. Sepsis Labs: No results for input(s): "PROCALCITON", "LATICACIDVEN" in the last 168 hours.  No results found for this or any previous visit (from the past 240 hour(s)).       Radiology Studies: CT HEAD WO CONTRAST (5MM)  Result Date: 10/04/2022 CLINICAL DATA:  Mental status change, unknown cause EXAM: CT HEAD WITHOUT CONTRAST TECHNIQUE: Contiguous axial images were obtained from the base of the skull through the vertex without intravenous contrast. RADIATION DOSE REDUCTION: This exam was performed according to the departmental dose-optimization program which includes automated exposure control, adjustment of the mA and/or kV according to patient size and/or use of iterative reconstruction technique. COMPARISON:  09/29/2022 FINDINGS: Brain: No evidence of acute infarction, hemorrhage, hydrocephalus, extra-axial collection or mass lesion/mass effect. Vascular: No hyperdense vessel or unexpected calcification.  Skull: Normal. Negative for fracture or focal lesion. Sinuses/Orbits: No acute finding. Other: None. IMPRESSION: No acute intracranial abnormality. Electronically Signed   By: Davina Poke D.O.   On: 10/04/2022 17:53   DG Chest 1 View  Result Date: 10/04/2022 CLINICAL DATA:  Congestive heart failure EXAM: CHEST  1 VIEW COMPARISON:  10/03/2022 FINDINGS: Stable cardiomegaly and pulmonary vascular congestion. Small to moderate bilateral pleural effusions with associated bibasilar opacities. No pneumothorax. IMPRESSION: Persistent findings of CHF with small to moderate bilateral pleural effusions and associated bibasilar opacities. Electronically Signed   By: Davina Poke D.O.   On: 10/04/2022 16:00         Scheduled Meds:  ARIPiprazole  7.5 mg Oral Daily   calcium acetate  667 mg Oral TID WC   Chlorhexidine Gluconate Cloth  6 each Topical Q0600   furosemide  80 mg Intravenous BID   labetalol  10 mg Intravenous Q6H   losartan  50 mg Oral Daily   sevelamer carbonate  1,600 mg Oral TID WC   sodium chloride flush  3 mL Intravenous Q12H   traZODone  100 mg Oral QHS   Continuous Infusions:  sodium chloride            Aline August, MD [facility] 10/06/2022, 7:32 AM

## 2022-10-06 NOTE — Evaluation (Signed)
Physical Therapy Evaluation Patient Details Name: Natasha Long MRN: [id number] DOB: [birth year] Today's Date: 10/06/2022  History of Present Illness  Pt is a 26 y/o F admitted on 10/03/22 after pt was found by bystanders at a gas station with pt c/o dizziness. Pt smoked crack cocaine the day prior to admission. On presentation, pt was found lethargic, tachypneic, and tachycardic with elevated BP. Chest x-ray showed B pleural effusion & pulmonary congestion. Troponins were elevated. PMH: chronic systolic heart failure with LVEF of 40-45%, severe mitral regurgitation, ESRD on HD, HTN, polysubstance abuse, PTSD, depression  Clinical Impression  Pt seen for PT evaluation with pt received in bathroom. Per chart pt is homeless & pt confirms this; pt reports she has no friends/family in the area but doesn't elaborate. Pt is able to complete toilet transfer with independence & ambulates bathroom>bed without AD & independently. Pt not eager/willing to mobilize further despite encouragement. PT attempts to educate pt on importance of attending dialysis appointments. Pt repeats twice during session that she would like to see a psychiatrist 2/2 "feeling depressed" -- MD & nurse notified. Anticipate pt mobilizes well but will attempt to see pt additional time to ensure this prior to signing off.      Recommendations for follow up therapy are one component of a multi-disciplinary discharge planning process, led by the attending physician.  Recommendations may be updated based on patient status, additional functional criteria and insurance authorization.  Follow Up Recommendations No PT follow up      Assistance Recommended at Discharge PRN  Patient can return home with the following  Assist for transportation    Equipment Recommendations None recommended by PT  Recommendations for Other Services       Functional Status Assessment Patient has had a recent decline in their functional status and  demonstrates the ability to make significant improvements in function in a reasonable and predictable amount of time.     Precautions / Restrictions Precautions Precautions: None Restrictions Weight Bearing Restrictions: No      Mobility  Bed Mobility               General bed mobility comments: not tested, pt received in bathroom & left sititng on bed    Transfers Overall transfer level: Independent                 General transfer comment: Pt transfers STS from toilet without assistance    Ambulation/Gait Ambulation/Gait assistance: Independent Gait Distance (Feet): 10 Feet Assistive device: None   Gait velocity: WNL     General Gait Details: BLE externally rotated (pt reports this is baseline), pt appears to have BLE fully extended in stance vs slight bend  Stairs            Wheelchair Mobility    Modified Rankin (Stroke Patients Only)       Balance Overall balance assessment: Mild deficits observed, not formally tested                                           Pertinent Vitals/Pain Pain Assessment Pain Assessment: Faces Faces Pain Scale: Hurts little more Pain Location: HA Pain Descriptors / Indicators: Headache Pain Intervention(s): Patient requesting pain meds-RN notified    Home Living Family/patient expects to be discharged to:: Shelter/Homeless  Additional Comments: Pt reports she did not have a place to stay prior to admission. Reports no family/friends in the area.    Prior Function Prior Level of Function : Independent/Modified Independent                     Hand Dominance        Extremity/Trunk Assessment   Upper Extremity Assessment Upper Extremity Assessment: Overall WFL for tasks assessed    Lower Extremity Assessment Lower Extremity Assessment: Overall WFL for tasks assessed       Communication   Communication: No difficulties  Cognition  Arousal/Alertness: Awake/alert Behavior During Therapy: Flat affect Overall Cognitive Status: Within Functional Limits for tasks assessed                                 General Comments: Pt with minimal interaction with PT, repeatedly states she would like to see a psychiatrist 2/2 "feeling depressed", appears to have low frustration tolerance.        General Comments      Exercises     Assessment/Plan    PT Assessment Patient needs continued PT services  PT Problem List Decreased strength;Decreased activity tolerance;Decreased balance       PT Treatment Interventions Gait training;Balance training;DME instruction;Therapeutic exercise;Stair training;Functional mobility training;Therapeutic activities;Patient/family education;Neuromuscular re-education    PT Goals (Current goals can be found in the Care Plan section)  Acute Rehab PT Goals Patient Stated Goal: see a psychiatrist PT Goal Formulation: With patient Time For Goal Achievement: 10/20/22 Potential to Achieve Goals: Good    Frequency Min 3X/week     Co-evaluation               AM-PAC PT "6 Clicks" Mobility  Outcome Measure Help needed turning from your back to your side while in a flat bed without using bedrails?: None Help needed moving from lying on your back to sitting on the side of a flat bed without using bedrails?: None Help needed moving to and from a bed to a chair (including a wheelchair)?: None Help needed standing up from a chair using your arms (e.g., wheelchair or bedside chair)?: None Help needed to walk in hospital room?: None Help needed climbing 3-5 steps with a railing? : A Little 6 Click Score: 23    End of Session   Activity Tolerance:  (Pt self limiting) Patient left: in bed;with call bell/phone within reach;with nursing/sitter in room Nurse Communication: Mobility status PT Visit Diagnosis: Unsteadiness on feet (R26.81)    Time: 0998-3382 PT Time Calculation  (min) (ACUTE ONLY): 9 min   Charges:   PT Evaluation $PT Eval Low Complexity: 1 Low          Lavone Nian, PT, DPT 10/06/22, 2:43 PM   Waunita Schooner 10/06/2022, 2:39 PM

## 2022-10-06 NOTE — TOC Initial Note (Signed)
Transition of Care [facility]) - Initial/Assessment Note    Patient Details  Name: Natasha Long MRN: [id number] Date of Birth: 1996/04/05  [facility]) CM/SW Contact:    Bethann Berkshire, LCSW Phone Number: 10/06/2022, 3:21 PM  Clinical Narrative:                   CSW received voicemail from [place] [employer], a Education officer, museum from pt's previous [facility] in [place]. [phone] ext [phone](primary) Call back # today is [phone].  CSW called Lavella Lemons who explained pt is being Dc'd from their clinic but wanted to confirm pt was having HD arranged in [place]. CSW informed Lavella Lemons that pt is accepted at [facility] in [place] for [place] provides some history that pt was staying at a homeless shelter in [place] but that they provided pt with a bus ticket to [place] at [employer] request. OPHD and shelter was not arranged prior to pt coming to [place]. Lavella Lemons explains that an APS report was made with [facility] APS. She states that Festus Holts is assigned APS worker. Lavella Lemons said she would be able to provide [name] contact info tomorrow.   CSW had a previous encounter with pt during her admission at [facility] in September 2023. [name] plan was to go to [facility] and she had OPHD arranged in [place] at that time. CSW met with pt to discuss disposition. Pt states [facility] and stated they were doing HVAC repairs and did not have beds. She has since been staying in a tent. She has presented at the ED multiple times since then. In early October, an attempt was made to get pt into [facility] shelter but they did not have beds at that time.   Pt had expressed interest in residential substance use treatment and assisted living to renal navigator. Pt states she smokes crack-cocaine and marijuana daily. She had been to [facility] residential treatment in the past but was not on OPHD at that time. Pt expresses interest  in [facility] in [place] as she thinks they take OPHD patients. CSW does not think any residential treatment centers accommodate OPHD though will explore this option; communicated this to pt. Regarding ALF; CSW explained pt would have a need and that pt's primary issue seems to be housing. Pt states she is unable to care for herself due to her depression stating that she doesn't feed herself, clean herself, or go to her HD due to her depression. She states this is true even when she had an apartment. CSW will explore potential for [facility] though unsure if she would be appropriate. CSW explains that if residential treatment and family care homes are not options, homeless shelter may be only alternative. Pt expresses interest in hotel programs and believes a hotel program through the [facility] may be starting for the winter. She would prefer this over a traditional shelter. She states she previously had a case manager named Janett Billow at [facility] that helped her with housing in the past though it has been at least half a year since she was involved with them. She does have SSDI income and medicaid. TOC will continue to assist with disposition.   CSW called APS, received # for Festus Holts, [phone]; left voicemail requesting return call.   CSW called [facility]; they do not take OPHD patients.   CSW left message with [facility] ending homeless[facility] requesting return call.   CSW called [facility]  inquiring about hotel programs for the winter; left message requesting return call.       Expected Discharge Plan: Homeless Shelter Barriers to Discharge: Continued Medical Work up, Homeless with medical needs   Patient Goals and CMS Choice        Expected Discharge Plan and Services Expected Discharge Plan: [place]   Discharge Planning Services: Other - See comment (needs dialysis center acceptance)   Living arrangements for the past 2 months: Homeless                                       Prior Living Arrangements/Services Living arrangements for the past 2 months: Homeless Lives with:: Self Patient language and need for interpreter reviewed:: Yes        Need for Family Participation in Patient Care: No (Comment) Care giver support system in place?: No (comment)   Criminal Activity/Legal Involvement Pertinent to Current Situation/Hospitalization: No - Comment as needed  Activities of Daily Living      Permission Sought/Granted                  Emotional Assessment Appearance:: Appears stated age Attitude/Demeanor/Rapport: Other (comment) (Sad) Affect (typically observed): Flat Orientation: : Oriented to Self, Oriented to Place, Oriented to  Time, Oriented to Situation Alcohol / Substance Use: Illicit Drugs Psych Involvement: No (comment)  Admission diagnosis:  Other chest pain [R07.89] Dizziness [R42] Lightheadedness [R42] CHF (congestive heart failure) ([place]) [I50.9] Patient Active Problem List   Diagnosis Date Noted   CHF (congestive heart failure) ([place]) 10/04/2022   Hyperkalemia, diminished renal excretion 76/22/6333   Toxic metabolic encephalopathy 54/56/2563   Back pain 08/31/2022   Homelessness 08/31/2022   Mitral valve insufficiency    Pleural effusion    Abnormal chest x-ray 08/24/2022   Heart failure with reduced ejection fraction ([place]) 08/24/2022   Acute on chronic systolic heart failure (HCC)    Volume overload 06/30/2022   Community acquired pneumonia 89/37/3428   Metabolic acidosis 76/81/1572   Acute kidney injury superimposed on CKD ([place]) 06/26/2022   Uremia of renal origin 05/15/2022   High risk sexual behavior 05/15/2022   Polysubstance abuse ([place]) 05/15/2022   Lip lesion 05/06/2022   Vaginal itching 05/06/2022   Left ankle pain 05/06/2022   Contamination of blood culture 05/06/2022   Uremia    Pericardial effusion without cardiac tamponade    Altered mental status 05/03/2022   Syphilis    Pain management     Septic arthritis of multiple joints ([place]) 02/01/2022   Group G streptococcal infection 02/01/2022   Left wrist pain    Hyperkalemia 01/30/2022   Effusion of right knee    Arthritis, septic [facility])    Dialysis patient, noncompliant    Lower leg pain 01/29/2022   Hypokalemia 01/29/2022   Hyperphosphatemia 01/29/2022   Hypervolemia associated with renal insufficiency 12/23/2021   Elevated troponin 12/23/2021   ESRD on dialysis ([place]) 12/04/2021   Tobacco use 08/11/2021   Nipple discharge 08/11/2021   Breast pain 08/11/2021   Exposure to sexually transmitted disease (STD) 62/01/5596   Complication of vascular dialysis catheter 02/12/2021   Moderate protein-calorie malnutrition ([place]) 01/27/2021   Allergy, unspecified, initial encounter 01/23/2021   Anemia in chronic kidney disease 01/23/2021   End stage renal disease ([place]) 01/23/2021   Iron deficiency anemia, unspecified 01/23/2021   Other specified coagulation defects ([place]) 01/23/2021   Secondary hyperparathyroidism of renal  origin ([place]) 01/23/2021   Anemia    Suicidal ideations    Influenza vaccine refused 12/19/2020   Morbid obesity ([place]) 01/30/2020   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   Cocaine use, 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[place]) 01/30/2020   Mood disorder ([place]) 06/27/2019   Borderline personality disorder ([place]) 11/16/2018   Moderate cannabis use disorder ([place]) 11/16/2018   Severe recurrent major depression without psychotic features ([place]) 11/15/2018   Chronic hypertension 04/04/2018   PTSD (post-traumatic stress disorder) 04/01/2018   PCP:  [name], MD Pharmacy:   [facility] [place], [place] - [address] [place] [address] Phone: [phone] Fax: [place] [address] [place] [address] Phone: [phone] Fax: [phone]     Social Determinants of Health (SDOH) Interventions    Readmission  Risk Interventions    06/29/2022   11:17 AM  Readmission Risk Prevention Plan  Transportation Screening Complete  Medication Review (RN Care Manager) Complete  PCP or Specialist appointment within 3-5 days of discharge Complete  HRI or [place] Complete  SW Recovery Care/Counseling Consult Complete  [place] Not Applicable

## 2022-10-06 NOTE — Progress Notes (Signed)
  Echocardiogram 2D Echocardiogram attempted at 0811. Pt is somnolent and uncooperative. Informed the nurse. Will attempt again as schedule permits.  Natasha Long 10/06/2022, 8:11 AM

## 2022-10-07 ENCOUNTER — Inpatient Hospital Stay (HOSPITAL_COMMUNITY): Payer: Medicaid Other

## 2022-10-07 DIAGNOSIS — R7989 Other specified abnormal findings of blood chemistry: Secondary | ICD-10-CM | POA: Diagnosis not present

## 2022-10-07 DIAGNOSIS — F1910 Other psychoactive substance abuse, uncomplicated: Secondary | ICD-10-CM | POA: Diagnosis not present

## 2022-10-07 DIAGNOSIS — G928 Other toxic encephalopathy: Secondary | ICD-10-CM | POA: Diagnosis not present

## 2022-10-07 DIAGNOSIS — F1494 Cocaine use, unspecified with cocaine-induced mood disorder: Secondary | ICD-10-CM

## 2022-10-07 DIAGNOSIS — I5023 Acute on chronic systolic (congestive) heart failure: Secondary | ICD-10-CM | POA: Diagnosis not present

## 2022-10-07 LAB — RENAL FUNCTION PANEL
Albumin: 2.6 g/dL — ABNORMAL LOW (ref 3.5–5.0)
Anion gap: 15 (ref 5–15)
BUN: 94 mg/dL — ABNORMAL HIGH (ref 6–20)
CO2: 24 mmol/L (ref 22–32)
Calcium: 9.6 mg/dL (ref 8.9–10.3)
Chloride: 98 mmol/L (ref 98–111)
Creatinine, Ser: 10.75 mg/dL — ABNORMAL HIGH (ref 0.44–1.00)
GFR, Estimated: 5 mL/min — ABNORMAL LOW (ref >60)
Glucose, Bld: 113 mg/dL — ABNORMAL HIGH (ref 70–99)
Phosphorus: 7.5 mg/dL — ABNORMAL HIGH (ref 2.5–4.6)
Potassium: 5 mmol/L (ref 3.5–5.1)
Sodium: 137 mmol/L (ref 135–145)

## 2022-10-07 LAB — CBC
HCT: 23.6 % — ABNORMAL LOW (ref 36.0–46.0)
Hemoglobin: 7.2 g/dL — ABNORMAL LOW (ref 12.0–15.0)
MCH: 30.1 pg (ref 26.0–34.0)
MCHC: 30.5 g/dL (ref 30.0–36.0)
MCV: 98.7 fL (ref 80.0–100.0)
Platelets: 182 10*3/uL (ref 150–400)
RBC: 2.39 MIL/uL — ABNORMAL LOW (ref 3.87–5.11)
RDW: 18.3 % — ABNORMAL HIGH (ref 11.5–15.5)
WBC: 5.8 10*3/uL (ref 4.0–10.5)
nRBC: 0 % (ref 0.0–0.2)

## 2022-10-07 LAB — ECHOCARDIOGRAM COMPLETE
Height: 68 in
Weight: 3428.59 oz

## 2022-10-07 LAB — HEPATITIS B SURFACE ANTIBODY, QUANTITATIVE: Hep B S AB Quant (Post): >1000 m[IU]/mL (ref >9.9)

## 2022-10-07 MED ORDER — HEPARIN SODIUM (PORCINE) 1000 UNIT/ML DIALYSIS: 5000.0000 [IU] | INTRAMUSCULAR | Status: DC | PRN

## 2022-10-07 MED ORDER — HEPARIN SODIUM (PORCINE) 1000 UNIT/ML DIALYSIS: 2000.0000 [IU] | INTRAMUSCULAR | Status: DC | PRN

## 2022-10-07 MED ORDER — HEPARIN SODIUM (PORCINE) 5000 UNIT/ML IJ SOLN: 5000.0000 [IU] | Freq: Three times a day (TID) | INTRAMUSCULAR | Status: DC

## 2022-10-07 MED ORDER — LOSARTAN POTASSIUM 50 MG PO TABS
100.0000 mg | ORAL_TABLET | Freq: Every day | ORAL | Status: DC
  Administered 2022-10-07: 100 mg via ORAL
  Filled 2022-10-07 (×2): qty 2

## 2022-10-07 MED ORDER — LABETALOL HCL 100 MG PO TABS
100.0000 mg | ORAL_TABLET | Freq: Three times a day (TID) | ORAL | Status: DC
  Administered 2022-10-07: 100 mg via ORAL
  Filled 2022-10-07 (×3): qty 1

## 2022-10-07 NOTE — Procedures (Signed)
Patient seen on Hemodialysis. BP (!) 158/95 (BP Location: Right Arm)   Pulse (!) 101   Temp 98.2 F (36.8 C)   Resp 19   Ht 5\' 8"  (1.727 m)   Wt 97.2 kg   LMP  (LMP Unknown) Comment: Patient notes she is not having periods (10/04/22)  SpO2 96%   BMI 32.58 kg/m   QB 400, UF goal 4L Tolerating treatment without complaints at this time.   Elmarie Shiley MD [facility]. Office # [phone] Pager # [phone] 10:34 AM

## 2022-10-07 NOTE — TOC Progression Note (Addendum)
Transition of Care [facility]) - Progression Note    Patient Details  Name: Natasha Long MRN: [id number] Date of Birth: 1996/08/12  Transition of Care [facility]) CM/SW [place], LCSW Phone Number: 10/07/2022, 11:25 AM  Clinical Narrative:     CSW received call back from [facility]. Provided update regarding pt. [place] staff person explained if pt was active with them, she likely already completed application staff. They will notify their case manager Janett Billow of pt and CSW's so Janett Billow can call and discuss potential assistance at [place] is informed the [place] primarily assists with more permanent housing versus shelter and that there are wait lists for their housing programs.    Received return call from [place] requesting return call. Cell # [phone]  0931: CSW called [facility] to ask about [place] and is informed that pt would need to speak to one of their case managers in person. CSW requested to speak with a case manager as pt is currently in the hospital and is told no, that pt will need to come in person to do a housing assessment.   1430: CSW contacted Nurse Practitioner at [facility] who explained she spoke with a [place] staff member who stated CSW could come and pick up application for [place]. CSW drove to [place] and picked up application. CSW completed application [place]ong with VI-SPDAT screening and emailed to [email or web address] as directed.   Expected Discharge Plan: Homeless Shelter Barriers to Discharge: Continued Medical Work up, Homeless with medical needs  Expected Discharge Plan and Services Expected Discharge Plan: [place]   Discharge Planning Services: Other - See comment (needs dialysis center acceptance)   Living arrangements for the past 2 months: Homeless                                       Social Determinants of Health (SDOH) Interventions    Readmission Risk Interventions    06/29/2022    11:17 AM  Readmission Risk Prevention Plan  Transportation Screening Complete  Medication Review ([place]) Complete  PCP or Specialist appointment within 3-5 days of discharge Complete  HRI or [name] Complete  SW Recovery Care/Counseling Consult Complete  [place] Not Applicable

## 2022-10-07 NOTE — Progress Notes (Signed)
PROGRESS NOTE    Natasha Long  RCV:[id number] DOB: 27-Aug-1996 DOA: 10/03/2022 PCP: [name], MD   Brief Narrative:  26 year old female with history of chronic systolic heart failure with LVEF of 40 to 45%, severe mitral regurgitation, end-stage renal disease on hemodialysis, noncompliance with chronic nonadherence and has been missing appointments/hemodialysis schedule multiple recent hospitalizations including the last one from 09/29/2022-10/02/2022 but patient left and has medical advice, hypertension, polysubstance abuse, PTSD, depression was found by bystanders in a gas station complaining of dizziness.  She had also smoked crack cocaine a day prior to presentation.  On presentation, she was lethargic, tachypneic and tachycardic with elevated blood pressures.  Chest x-ray showed bilateral pleural effusion and pulmonary congestion.  Troponins were 718, 717 and 707.  Nephrology was consulted.  TOC consulted because of patient being homeless.  Assessment & Plan:   Volume overload End-stage renal disease on hemodialysis Acute on chronic systolic heart failure Noncompliance with HD and medications Polysubstance/cocaine use Homelessness -Patient had recently signed out AMA on 10/02/2022.  Outpatient HD unit was being arranged at the time. -Nephrology following.  Dialysis as per nephrology schedule.  IV Lasix has been discontinued. -Counseled regarding compliance and abstinence from cocaine -Outpatient hemodialysis unit has been arranged. -Strict input and output, daily weights, fluid restriction. -TOC following to arrange for safe disposition as patient is homeless  Acute toxic encephalopathy -Possibly due to cocaine use and worsening uremia.  CT of the head without contrast was negative for any acute intracranial abnormality -Patient was very drowsy on presentation and had received Ativan as well. -Ativan and gabapentin have been discontinued. -Mental status back to baseline.    Hypertension -Blood pressure still on the high side but improving.  Currently on IV labetalol along with oral losartan.  Increase oral losartan to 100 mg daily.  Change IV labetalol to oral labetalol.  IV Lasix has been discontinued.  Patient intermittently refuses medications.  Depression -Patient requested for psychiatry evaluation.  Psychiatry consult pending.  Elevated troponins -Possibly from demand ischemia from volume overload. -Last troponin was 585 and trending down. -Currently on aspirin 81 mg daily.  Echocardiogram ordered by admitting hospitalist still pending.  Chronic pericardial effusion -Possibly due to end-stage renal disease.  Follow echocardiogram.  Management by dialysis.  Obesity -outpatient follow-up   DVT prophylaxis: Resume heparin.   Code Status: Full Family Communication: None at bedside Disposition Plan: Status is: Inpatient Remains inpatient appropriate because: Of need for safe disposition.  Consultants: Nephrology/psychiatry  Procedures: None  Antimicrobials: None   Subjective: Patient seen and examined at bedside undergoing hemodialysis.  Wakes up only very slightly, hardly answers any questions.  Poor historian.  Patient refused medications yesterday evening.  No fever, agitation, seizures reported.   Objective: Vitals:   10/07/22 0051 10/07/22 0314 10/07/22 0318 10/07/22 0542  BP:    (!) 160/102  Pulse: (!) 102 (!) 105 (!) 101 98  Resp:    20  Temp:    (!) 97.2 F (36.2 C)  TempSrc:    Axillary  SpO2: 95% (!) 80% 91% 92%  Weight:    97.4 kg  Height:        Intake/Output Summary (Last 24 hours) at 10/07/2022 0721 Last data filed at 10/07/2022 0708 Gross per 24 hour  Intake 1320 ml  Output --  Net 1320 ml    Filed Weights   10/05/22 0819 10/05/22 1215 10/07/22 0542  Weight: 96.6 kg 93.5 kg 97.4 kg    Examination:  General: No  acute distress.  Still on room air.  Looks chronically ill and deconditioned. ENT/neck: No  obvious JVD elevation or palpable neck masses  respiratory: Bilateral decreased breath sounds at bases with scattered crackles CVS: Still tachycardic; S1 and S2 heard  abdominal: Soft, obese, nontender, still distended; no organomegaly, normal bowel sounds heard Extremities: No clubbing; bilateral lower extremity edema present CNS:   Wakes up only very slightly, hardly answers any questions.  Poor historian.  No focal neurologic deficit.  Able to move extremities  lymph: No palpable lymphadenopathy noted Skin: No obvious petechiae/rashes  psych: Affect is flat.  No signs of agitation. Musculoskeletal: No obvious joint swelling/deformity/erythema     Data Reviewed: I have personally reviewed following labs and imaging studies  CBC: Recent Labs  Lab 10/01/22 0311 10/03/22 2049  WBC 9.4 11.0*  HGB 8.1* 8.9*  HCT 24.5* 28.1*  MCV 94.6 97.6  PLT 212 604    Basic Metabolic Panel: Recent Labs  Lab 10/01/22 0311 10/01/22 1013 10/03/22 2049 10/04/22 1547 10/05/22 0345 10/06/22 0525 10/06/22 1433  NA 137  --  139 138 135 138  --   K 4.4 4.5 4.5 4.7 4.8 4.5  --   CL 97*  --  99 97* 100 96*  --   CO2 24  --  24 23 20* 24  --   GLUCOSE 156*  --  90 112* 123* 105*  --   BUN 115*  --  93* 105* 108* 76*  --   CREATININE 14.64*  --  11.99* 12.55* 13.22* 9.70*  --   CALCIUM 8.4*  --  9.3 8.9 8.9 9.5  --   MG 2.4  --   --   --   --   --   --   PHOS  --   --   --   --   --   --  7.2*    GFR: Estimated Creatinine Clearance: 10.5 mL/min (A) (by C-G formula based on SCr of 9.7 mg/dL (H)). Liver Function Tests: Recent Labs  Lab 10/01/22 0311  AST 42*  ALT 40  ALKPHOS 74  BILITOT 0.8  PROT 6.1*  ALBUMIN 2.6*    No results for input(s): "LIPASE", "AMYLASE" in the last 168 hours. No results for input(s): "AMMONIA" in the last 168 hours. Coagulation Profile: No results for input(s): "INR", "PROTIME" in the last 168 hours. Cardiac Enzymes: Recent Labs  Lab 10/04/22 1547   CKTOTAL 180    BNP (last 3 results) No results for input(s): "PROBNP" in the last 8760 hours. HbA1C: No results for input(s): "HGBA1C" in the last 72 hours. CBG: Recent Labs  Lab 09/30/22 1142 09/30/22 2038 10/01/22 0635 10/01/22 0820 10/02/22 0814  GLUCAP 103* 131* 141* 168* 131*    Lipid Profile: Recent Labs    10/04/22 1547  CHOL 109  HDL 54  LDLCALC 42  TRIG 66  CHOLHDL 2.0    Thyroid Function Tests: No results for input(s): "TSH", "T4TOTAL", "FREET4", "T3FREE", "THYROIDAB" in the last 72 hours. Anemia Panel: No results for input(s): "VITAMINB12", "FOLATE", "FERRITIN", "TIBC", "IRON", "RETICCTPCT" in the last 72 hours. Sepsis Labs: No results for input(s): "PROCALCITON", "LATICACIDVEN" in the last 168 hours.  No results found for this or any previous visit (from the past 240 hour(s)).       Radiology Studies: No results found.      Scheduled Meds:  ARIPiprazole  7.5 mg Oral Daily   calcium acetate  667 mg Oral TID WC  Chlorhexidine Gluconate Cloth  6 each Topical Q0600   darbepoetin (ARANESP) injection - DIALYSIS  200 mcg Subcutaneous Q Wed-1800   labetalol  10 mg Intravenous Q6H   losartan  50 mg Oral Daily   metoprolol tartrate  12.5 mg Oral BID   sevelamer carbonate  1,600 mg Oral TID WC   sodium chloride flush  3 mL Intravenous Q12H   traZODone  100 mg Oral QHS   Continuous Infusions:  sodium chloride            Aline August, MD [facility] 10/07/2022, 7:21 AM

## 2022-10-07 NOTE — Progress Notes (Signed)
Patient walked out into hall crying and sat down at nurses charting station. I asked what was wrong and she staed, "it is too fucking hot in my room". She was told we would turn down the thermostat and she said that wouldn't, work. I asked to get my stuff from behind her that I would let her sit there. She said, "you want let me sit here because I will fucking sit anywhere I want to and you can't stop me, I took my stuff and removed myself from the area to allow her to calm down.

## 2022-10-07 NOTE — Progress Notes (Signed)
Received patient in bed to unit.  Alert and oriented.  Informed consent signed and in chart.   Treatment initiated:0900 Treatment completed: 1230  Patient tolerated well.  Transported back to the room  Alert, without acute distress.  Hand-off given to patient's nurse.   Access used: AVF Access issues: none  Total UF removed: 4 L  Medication(s) given: none Post HD VS: 160/90 P 100 R 20  Post HD weight: 93 KG   [facility]

## 2022-10-07 NOTE — Progress Notes (Addendum)
Pts. SpO2 dropping into the 70's while she was asleep. This RN applied [facility] to Patient. Pt. Then proceeded to rip the nasal cannula off of her face. Pt. The stated, "whatever you do, do not put that oxygen back on my face! Do not put that back on my face!" Explained rationale of the necessity of wearing supplemental oxygen to patient, but she refuses. Dr. Irene Pap made aware, who suggested a venturi mask. Pt. Amenable to venturi mask briefly but then around 0317, Pt. Took off venturi mask and refused to put it back on. Dr. [place] Crane made aware.

## 2022-10-07 NOTE — Progress Notes (Signed)
2D echo attempted, patient in dialysis. Will try later

## 2022-10-07 NOTE — Consult Note (Signed)
Attempted to assess patient and she is off the floor at this time. Will attempt to reassess later this afternoon or tomorrow.   In summary patient is well known to the [facility] service line, with frequent admissions, ED visits, and facility based crisis. She has a past psychiatric history of bipolar disorder, depression, polysubstance abuse, PTSD, NSSIB, and behaviors consistent with  borderline personality. She has a history of non compliance, homelessness and malingering.   Psychiatric consult service will attempt to assess. In the interim if there appears to be any safety concerns, please consider 1:1.

## 2022-10-07 NOTE — Progress Notes (Signed)
Contacted by Emilie Rutter this morning to be advised that pt's schedule will need to change. Pt will be MWF 11:45 chair time. Pt will need to arrive at 10:45 for first appt to complete paperwork prior to treatment. Update provided to attending, nephrologist, CSW, and RN CM via secure chat. Appt details added to AVS and will discuss with pt tomorrow.   Melven Sartorius Renal Navigator [phone]

## 2022-10-08 ENCOUNTER — Other Ambulatory Visit (HOSPITAL_COMMUNITY): Payer: Medicaid Other

## 2022-10-08 ENCOUNTER — Encounter: Payer: Self-pay | Admitting: Physician Assistant

## 2022-10-08 DIAGNOSIS — R45851 Suicidal ideations: Secondary | ICD-10-CM

## 2022-10-08 LAB — CBC WITH DIFFERENTIAL/PLATELET
Abs Immature Granulocytes: 0.01 10*3/uL (ref 0.00–0.07)
Basophils Absolute: 0 10*3/uL (ref 0.0–0.1)
Basophils Relative: 0 %
Eosinophils Absolute: 0.1 10*3/uL (ref 0.0–0.5)
Eosinophils Relative: 2 %
HCT: 24 % — ABNORMAL LOW (ref 36.0–46.0)
Hemoglobin: 7.4 g/dL — ABNORMAL LOW (ref 12.0–15.0)
Immature Granulocytes: 0 %
Lymphocytes Relative: 16 %
Lymphs Abs: 1 10*3/uL (ref 0.7–4.0)
MCH: 30.2 pg (ref 26.0–34.0)
MCHC: 30.8 g/dL (ref 30.0–36.0)
MCV: 98 fL (ref 80.0–100.0)
Monocytes Absolute: 0.5 10*3/uL (ref 0.1–1.0)
Monocytes Relative: 8 %
Neutro Abs: 4.8 10*3/uL (ref 1.7–7.7)
Neutrophils Relative %: 74 %
Platelets: 212 10*3/uL (ref 150–400)
RBC: 2.45 MIL/uL — ABNORMAL LOW (ref 3.87–5.11)
RDW: 17.8 % — ABNORMAL HIGH (ref 11.5–15.5)
WBC: 6.5 10*3/uL (ref 4.0–10.5)
nRBC: 0 % (ref 0.0–0.2)

## 2022-10-08 LAB — BASIC METABOLIC PANEL
Anion gap: 12 (ref 5–15)
BUN: 66 mg/dL — ABNORMAL HIGH (ref 6–20)
CO2: 28 mmol/L (ref 22–32)
Calcium: 9.7 mg/dL (ref 8.9–10.3)
Chloride: 98 mmol/L (ref 98–111)
Creatinine, Ser: 8.42 mg/dL — ABNORMAL HIGH (ref 0.44–1.00)
GFR, Estimated: 6 mL/min — ABNORMAL LOW (ref >60)
Glucose, Bld: 108 mg/dL — ABNORMAL HIGH (ref 70–99)
Potassium: 4.6 mmol/L (ref 3.5–5.1)
Sodium: 138 mmol/L (ref 135–145)

## 2022-10-08 LAB — IRON AND TIBC
Iron: 30 ug/dL (ref 28–170)
Saturation Ratios: 12 % (ref 10.4–31.8)
TIBC: 252 ug/dL (ref 250–450)
UIBC: 222 ug/dL

## 2022-10-08 LAB — FERRITIN: Ferritin: 232 ng/mL (ref 11–307)

## 2022-10-08 MED ORDER — CALCIUM ACETATE (PHOS BINDER) 667 MG PO CAPS: 1334.0000 mg | ORAL_CAPSULE | Freq: Three times a day (TID) | ORAL | Status: DC

## 2022-10-08 NOTE — Progress Notes (Addendum)
Met with pt in the hall (which is where pt was sitting) to go over change in out-pt HD schedule. Pt provided a schedule letter with changes noted. Pt agreeable to plan.   Melven Sartorius Renal Navigator [phone]  Addendum at 2:37 pm: Noted pt d/c from epic. Chart reviewed and noted pt left AMA. Contacted Emilie Rutter to advise clinic pt left AMA and may/may not come to treatment tomorrow. Contacted renal PA regarding clinic's need for orders in the event pt shows up for treatment tomorrow.

## 2022-10-08 NOTE — Progress Notes (Signed)
Pt refused vitals this morning. Refused all meds. Pt started getting angry and cussed at RN pt stated "fucking get out". Attempted education, pt refused RN left room. Will notify MD.

## 2022-10-08 NOTE — Progress Notes (Signed)
Pt walked out refused to sign any AMA paper. MD, charge nurse and  AD notified. RN ran after her but pt was verbally aggressive and getting agitated, RN feared she might get physically abusive. No IV in place thus charge nurse said no need to call security to stop her.

## 2022-10-08 NOTE — TOC Progression Note (Addendum)
Transition of Care [facility]) - Progression Note    Patient Details  Name: Natasha Long MRN: [id number] Date of Birth: 05/23/1996  Transition of Care [facility]) CM/SW [place], [place] Phone Number: 10/08/2022, 10:40 AM  Clinical Narrative:     CSW called [facility] and asked for [facility], case manager associated with [facility] program. He is not available. CSW left contact information with [facility] secretery for return call.  1110: CSW met with pt and updated that he is awaiting a response from [facility]. Pt states she wants to discharge and go to the [facility] in person. CSW explained that their is no guarantee that they can accept her in the [place] or when and that she may not have a place to go tonight if she discharges. CSW expressed concern on how pt would get to her HD as she does not have a phone to schedule medicaid transport or a known location where she will be for pick up. Pt states she just wants to leave expressing that being in the hospital causes anxiety due to her history in foster care as a minor. CSW explains the attending MD is the one who discharges pt. Pt states "well I'll just leave then." CSW notified MD and RN.   6237: CSW notified by RN that pt walked off unit to leave AMA.   CSW called and notified APS worker [place].   Expected Discharge Plan: Homeless Shelter Barriers to Discharge: Continued Medical Work up, Homeless with medical needs  Expected Discharge Plan and Services Expected Discharge Plan: [place]   Discharge Planning Services: Other - See comment (needs dialysis center acceptance)   Living arrangements for the past 2 months: Homeless                                       Social Determinants of Health (SDOH) Interventions    Readmission Risk Interventions    06/29/2022   11:17 AM  Readmission Risk Prevention Plan  Transportation Screening Complete  Medication Review ([place]) Complete  PCP or Specialist appointment  within 3-5 days of discharge Complete  HRI or [place] Complete  SW Recovery Care/Counseling Consult Complete  [place] Not Applicable

## 2022-10-08 NOTE — Consult Note (Addendum)
[facility] Face-to-Face Psychiatry Consult   Reason for Consult:  Patient requested, suicidal ideations Referring Physician:  Dr. 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 female patient admitted with a psychiatric history significant for reported bipolar 2 disorder, cocaine use disorder,  borderline personality disorder and homelessness, who presented to the ED for for multiple complaints including dizziness. She also states that she is feeling suicidal due to the above complaints.  Patient has been admitted to the medical unit, where she has been disgruntled, agitated, verbally aggressive, and disrespectful towards nursing staff.  On today's evaluation patient did deny any current suicidality, only endorses sadness, backaches, and leg pain.  She does vocalize wanting a medication that will target her impulsivity, sporadic behaviors, and anxiety.  She is currently taking Abilify 7.5 mg, and hydroxyzine 25 mg p.o. 3 times daily as needed.    On assessment the patient is resting comfortably in her room, NAD. The patient appears euthymic and inquires regarding her health. The patient is enjoying food and continues to eat, cotton throughout the psychiatric interview. She reports that she is here today because of sadness, and pain in most parts of her body. This limits her physical  abilities which causes her to be depressed. She has been self-medicating with cocaine for pain which she understands are not good long-term solutions.She reports medication adherence to the above regimen though she does not believe that it is fully optimized.  She does not have a psychiatrist at this time, due to noncompliance.  Patient endorses poor follow-up as evidenced by multiple emergency room visits and hospital admissions.  She endorses similar history with her nephrologist and dialysis clinic.  When asked what she thinks would help her most she reports that she thinks that she needs to better medication adjustment, specifically medication and would target impulsivity, sporadic needs, and anxiety.  She demonstrates future orientation by stating that she is excited that she has recently been accepted to a dialysis clinic. This is reasonable as the patient's symptoms seem to be related to factors not modifiable by admission or medication adjustment (homelessness, polysubstance abuse, chronic pain, borderline personality disorder. The patient states that she has not established herself with outpatient psychiatry following these admissions due to the fact that she does not believe it would help her.  The patient's affect, behavior, and treatment request are inconsistent with her reported symptoms. The patient is future oriented as demonstrated by requesting a bed, and just missing medication, and outpatient follow-up. Her recurrent suicidality is related to factors nonmodifiable by a short inpatient admission (pain, health problems, homelessness, unemployment). She does not currently have a plan to take her life, and has been staying without suicidality for over a year. Additionally the patient has visited the emergency room more than 30 times in the past 6 months and has been admitted to both medical and psychiatric floors multiple times. The patient has failed to  follow up with outpatient psychiatric care  following all of these hospitalizations. She has a consistent pattern of not benefiting from inpatient psychiatric care and the most helpful thing for her scenario would be to establish herself with an outpatient psychiatrist who she can meet with consistently and regularly. The patient has a consistent history of visiting emergency rooms whenever she feels she is unwell or unsafe, or missed  dialysis and is encouraged to always come to the hospital when she has concerns.  Chart review reveals that the patient has a consistent pattern of presenting to emergency rooms for chronic pain, neurological symptoms, and psychiatric symptoms with concerns for secondary gain.  HPI:   Natasha Long is a 26 y.o. female with medical history significant of chronic HFrEF with LVEF of 40-45%, severe MR, ESRD on HD TTS, HTN, PTSD, was found by bystander in a gas station complaining about feeling dizziness.   Patient also recently hospitalized for CHF decompensation after missed dialysis.  Her last dialysis was last Thursday, and on Friday, patient signed out AMA.  Yesterday afternoon patient was found outside gas station complaining about feeling lightheadedness, she also admitted that she smoked crack cocaine yesterday.  Right now patient lethargic and confused, unable to answer any questions.  According to ED record, patient was complaining about feeling lightheadedness, headache, chest pain and shortness of breath.  Uncontrolled but patient is still taking any of her medications at this point.  Past Psychiatric History: Bipolar disorder type II, borderline personality disorder, trichotillomania, complex PTSD, generalized anxiety disorder, major depression disorder.  She is currently taking Abilify 7.5 mg and hydroxyzine 25 mg p.o. 3 times daily as needed.  She currently denies having any outpatient psychiatrist, therapist, and or counseling services.  She denies any recent substance abuse rehabilitation programs.  She  reports her last inpatient hospitalization was almost 2 years ago at [facility].  She does report history of admissions to [facility], [facility], [place], old [place], [place].  She does admit to substance use to include cocaine and nicotine (smoking about 1/2 pack/day).  Risk to Self:  Denies Risk to Others:  Denies Prior Inpatient Therapy:   Denies Prior Outpatient Therapy:   Denies  Past Medical History:  Past Medical History:  Diagnosis Date   Asthma    as a child, no problem as an adult, no inhaler   Complication of anesthesia    woke up before tube removed, 1 time fought nurses   ESRD on hemodialysis [facility])    M-W-F   History of borderline personality disorder    Hypertension    diagnosed as child; stopped meds at 2 yo   Insomnia    Neuromuscular disorder ([place])    peripheral neuropathy   Nonspecific chest pain    PTSD (post-traumatic stress disorder)     Past Surgical History:  Procedure Laterality Date   AV FISTULA PLACEMENT Left 10/18/2020   Procedure: LEFT ARM ARTERIOVENOUS (AV) FISTULA CREATION;  Surgeon: Serafina Mitchell, MD;  Location: [facility] OR;  Service: Vascular;  Laterality: Left;   CHOLECYSTECTOMY     extraction of wisdom teeth     FISTULA SUPERFICIALIZATION Left 02/13/2021   Procedure: LEFT BRACHIOCEPHALIC ARTERIOVENOUS FISTULA SUPERFICIALIZATION;  Surgeon: Serafina Mitchell, MD;  Location: [place];  Service: Vascular;  Laterality: Left;   I & D EXTREMITY Left 01/30/2022   Procedure: IRRIGATION AND DEBRIDEMENT EXTREMITY;  Surgeon: Milly Jakob, MD;  Location: [place];  Service: Orthopedics;  Laterality: Left;  INCISION AND DRAINAGE OF WOUND Left 01/30/2022   Procedure: LEFT WRIST ASPIRATION;  Surgeon: Vanetta Mulders, MD;  Location: [place];  Service: Orthopedics;  Laterality: Left;   KNEE ARTHROSCOPY Right 01/30/2022   Procedure: ARTHROSCOPY KNEE AND IRRIIGATION AND DEBRIDMENT; LEFT WRIST ASPIRATION;  Surgeon: Vanetta Mulders, MD;  Location:  [place];  Service: Orthopedics;  Laterality: Right;   RENAL BIOPSY     x 2   TUNNELED VENOUS CATHETER PLACEMENT  02/11/2021   [facility]   Family History:  Family History  Adopted: Yes  Problem Relation Age of Onset   Diabetes Other    Hypertension Other    Family Psychiatric  History: Mother -schizophrenia, Sister (age 35) dx schizophrenia. Maternal grandmother- Depression  Social History:  Social History   Substance and Sexual Activity  Alcohol Use Not Currently   Comment: "maybe 3 a month."- liquor     Social History   Substance and Sexual Activity  Drug Use Yes   Types: Marijuana, Cocaine   Comment: reports daily use of both    Social History   Socioeconomic History   Marital status: Soil scientist    Spouse name: Not on file   Number of children: 1   Years of education: Not on file   Highest education level: 12th grade  Occupational History   Occupation: unemployed  Tobacco Use   Smoking status: Every Day    Packs/day: 0.50    Years: 10.00    Total pack years: 5.00    Types: Cigarettes   Smokeless tobacco: Never  Vaping Use   Vaping Use: Never used  Substance and Sexual Activity   Alcohol use: Not Currently    Comment: "maybe 3 a month."- liquor   Drug use: Yes    Types: Marijuana, Cocaine    Comment: reports daily use of both   Sexual activity: Not Currently    Birth control/protection: None  Other Topics Concern   Not on file  Social History Narrative   Not on file   Social Determinants of Health   Financial Resource Strain: Unknown (01/13/2019)   Overall Financial Resource Strain (CARDIA)    Difficulty of Paying Living Expenses: Patient refused  Food Insecurity: Food Insecurity Present (08/25/2022)   Hunger Vital Sign    Worried About Running Out of Food in the Last Year: Often true    Ran Out of Food in the Last Year: Often true  Transportation Needs: Unmet Transportation Needs (08/25/2022)   PRAPARE - Transportation    Lack of  Transportation (Medical): Yes    Lack of Transportation (Non-Medical): Yes  Physical Activity: Unknown (01/13/2019)   Exercise Vital Sign    Days of Exercise per Week: Patient refused    Minutes of Exercise per Session: Patient refused  Stress: Unknown (01/13/2019)   [employer] [place]    Feeling of Stress : Patient refused  Social Connections: Unknown (01/13/2019)   Social Connection and Isolation Panel [NHANES]    Frequency of Communication with Friends and Family: Patient refused    Frequency of Social Gatherings with Friends and Family: Patient refused    Attends Religious Services: Patient refused    Active Member of Clubs or Organizations: Patient refused    Attends Archivist Meetings: Patient refused    Marital Status: Patient refused   Additional Social History:    Allergies:   Allergies  Allergen Reactions   Bupropion Anxiety, Other (See Comments) and Nausea And Vomiting  Caused panic attacks  Caused panic attacks    "Adverse effects"   Prozac [Fluoxetine Hcl] Anxiety and Other (See Comments)    Caused panic attacks   Prednisone Other (See Comments)    Pt stated this med caused pancreatitis    Labs:  Results for orders placed or performed during the hospital encounter of 10/03/22 (from the past 48 hour(s))  Hepatitis B surface antigen     Status: None   Collection Time: 10/06/22  2:33 PM  Result Value Ref Range   Hepatitis B Surface Ag NON REACTIVE NON REACTIVE    Comment: Performed at [facility], [address], [place], [place] [address]  Hepatitis B surface antibody,quantitative     Status: None   Collection Time: 10/06/22  2:33 PM  Result Value Ref Range   Hep B S AB Quant (Post) >1,000.0 Immunity>9.9 mIU/mL    Comment: (NOTE)  Status of Immunity                     Anti-HBs Level  ------------------                     -------------- Inconsistent with Immunity                    0.0 - 9.9 Consistent with Immunity                          >9.9 Performed At: [facility] [place], [place] [id number] Rush Farmer MD [id number]   Phosphorus     Status: Abnormal   Collection Time: 10/06/22  2:33 PM  Result Value Ref Range   Phosphorus 7.2 (H) 2.5 - 4.6 mg/dL    Comment: Performed at [facility] [address] [place], [place] [address]  Renal function panel     Status: Abnormal   Collection Time: 10/07/22  9:00 AM  Result Value Ref Range   Sodium 137 135 - 145 mmol/L   Potassium 5.0 3.5 - 5.1 mmol/L   Chloride 98 98 - 111 mmol/L   CO2 24 22 - 32 mmol/L   Glucose, Bld 113 (H) 70 - 99 mg/dL    Comment: Glucose reference range applies only to samples taken after fasting for at least 8 hours.   BUN 94 (H) 6 - 20 mg/dL   Creatinine, Ser 10.75 (H) 0.44 - 1.00 mg/dL   Calcium 9.6 8.9 - 10.3 mg/dL   Phosphorus 7.5 (H) 2.5 - 4.6 mg/dL   Albumin 2.6 (L) 3.5 - 5.0 g/dL   GFR, Estimated 5 (L) >60 mL/min    Comment: (NOTE) Calculated using the CKD-EPI Creatinine Equation (2021)    Anion gap 15 5 - 15    Comment: Performed at [facility] [address], [place], [place] [address]  CBC     Status: Abnormal   Collection Time: 10/07/22  9:00 AM  Result Value Ref Range   WBC 5.8 4.0 - 10.5 K/uL   RBC 2.39 (L) 3.87 - 5.11 MIL/uL   Hemoglobin 7.2 (L) 12.0 - 15.0 g/dL   HCT 23.6 (L) 36.0 - 46.0 %   MCV 98.7 80.0 - 100.0 fL   MCH 30.1 26.0 - 34.0 pg   MCHC 30.5 30.0 - 36.0 g/dL   RDW 18.3 (H) 11.5 - 15.5 %   Platelets 182 150 - 400 K/uL   nRBC 0.0 0.0 -  0.2 %    Comment: Performed at [facility], [place] [address] [place], [place] [address]  Basic metabolic panel     Status: Abnormal   Collection Time: 10/08/22 10:56 AM  Result Value Ref Range   Sodium 138 135 - 145 mmol/L   Potassium 4.6 3.5 - 5.1 mmol/L   Chloride 98 98 - 111 mmol/L   CO2 28 22 - 32 mmol/L   Glucose, Bld 108 (H) 70 - 99 mg/dL    Comment: Glucose  reference range applies only to samples taken after fasting for at least 8 hours.   BUN 66 (H) 6 - 20 mg/dL   Creatinine, Ser 8.42 (H) 0.44 - 1.00 mg/dL   Calcium 9.7 8.9 - 10.3 mg/dL   GFR, Estimated 6 (L) >60 mL/min    Comment: (NOTE) Calculated using the CKD-EPI Creatinine Equation (2021)    Anion gap 12 5 - 15    Comment: Performed at [facility] [address] [place], [place] [address]  CBC with Differential/Platelet     Status: Abnormal   Collection Time: 10/08/22 10:56 AM  Result Value Ref Range   WBC 6.5 4.0 - 10.5 K/uL   RBC 2.45 (L) 3.87 - 5.11 MIL/uL   Hemoglobin 7.4 (L) 12.0 - 15.0 g/dL   HCT 24.0 (L) 36.0 - 46.0 %   MCV 98.0 80.0 - 100.0 fL   MCH 30.2 26.0 - 34.0 pg   MCHC 30.8 30.0 - 36.0 g/dL   RDW 17.8 (H) 11.5 - 15.5 %   Platelets 212 150 - 400 K/uL   nRBC 0.0 0.0 - 0.2 %   Neutrophils Relative % 74 %   Neutro Abs 4.8 1.7 - 7.7 K/uL   Lymphocytes Relative 16 %   Lymphs Abs 1.0 0.7 - 4.0 K/uL   Monocytes Relative 8 %   Monocytes Absolute 0.5 0.1 - 1.0 K/uL   Eosinophils Relative 2 %   Eosinophils Absolute 0.1 0.0 - 0.5 K/uL   Basophils Relative 0 %   Basophils Absolute 0.0 0.0 - 0.1 K/uL   Immature Granulocytes 0 %   Abs Immature Granulocytes 0.01 0.00 - 0.07 K/uL    Comment: Performed at [facility] [address], [place], [place] [address]  Iron and TIBC     Status: None   Collection Time: 10/08/22 10:56 AM  Result Value Ref Range   Iron 30 28 - 170 ug/dL   TIBC 252 250 - 450 ug/dL   Saturation Ratios 12 10.4 - 31.8 %   UIBC 222 ug/dL    Comment: Performed at [facility], [place] [address], [place], [place] [address]    Current Facility-Administered Medications  Medication Dose Route Frequency Provider Last Rate Last Admin   0.9 %  sodium chloride infusion  250 mL Intravenous PRN Wynetta Fines T, MD       acetaminophen (TYLENOL) tablet 650 mg  650 mg Oral Q4H PRN Wynetta Fines T, MD   650 mg at 10/06/22 2216   ARIPiprazole  (ABILIFY) tablet 7.5 mg  7.5 mg Oral Daily Wynetta Fines T, MD   7.5 mg at 10/07/22 1322   calcium acetate (PHOSLO) capsule 1,334 mg  1,334 mg Oral TID WC Reesa Chew, MD       Chlorhexidine Gluconate Cloth 2 % PADS 6 each  6 each Topical Q0600 Roney Jaffe, MD   6 each at 10/07/22 0700   Darbepoetin Alfa (ARANESP) injection 200 mcg  200 mcg Subcutaneous Q Wed-1800 Elmarie Shiley, MD       dicyclomine (BENTYL) tablet 20 mg  20 mg Oral Q6H PRN Wynetta Fines T, MD       heparin injection 5,000 Units  5,000 Units Subcutaneous Q8H Alekh, Kshitiz, MD       hydrOXYzine (ATARAX) tablet 25 mg  25 mg Oral Q8H PRN Wynetta Fines T, MD   25 mg at 10/07/22 1938   labetalol (NORMODYNE) tablet 100 mg  100 mg Oral TID Aline August, MD   100 mg at 10/07/22 1538   losartan (COZAAR) tablet 100 mg  100 mg Oral Daily Starla Link, Kshitiz, MD   100 mg at 10/07/22 1323   methocarbamol (ROBAXIN) tablet 500 mg  500 mg Oral Q8H PRN Lequita Halt, MD       nitroGLYCERIN (NITROSTAT) SL tablet 0.4 mg  0.4 mg Sublingual Q5 min PRN Rex Kras, PA       ondansetron [facility]) injection 4 mg  4 mg Intravenous Q6H PRN Wynetta Fines T, MD       sevelamer carbonate (RENVELA) tablet 1,600 mg  1,600 mg Oral TID WC Wynetta Fines T, MD   1,600 mg at 10/07/22 1537   sodium chloride flush (NS) 0.9 % injection 3 mL  3 mL Intravenous Q12H Wynetta Fines T, MD   3 mL at 10/05/22 2045   sodium chloride flush (NS) 0.9 % injection 3 mL  3 mL Intravenous PRN Wynetta Fines T, MD       traZODone (DESYREL) tablet 100 mg  100 mg Oral QHS Wynetta Fines T, MD   100 mg at 10/06/22 2216   Current Outpatient Medications  Medication Sig Dispense Refill   ARIPiprazole (ABILIFY) 5 MG tablet Take 1.5 tablets (7.5 mg total) by mouth daily. (Patient taking differently: Take 10 mg by mouth daily.) 45 tablet 1   b complex-vitamin c-folic acid (NEPHRO-VITE) 0.8 MG TABS tablet Take 1 tablet by mouth daily. 30 tablet 1   calcitRIOL (ROCALTROL) 0.25 MCG capsule Take 1 capsule (0.25  mcg total) by mouth Every Tuesday,Thursday,and Saturday with dialysis. (Patient taking differently: Take 0.25 mcg by mouth every Monday, Wednesday, and Friday with hemodialysis.) 30 capsule 3   calcium acetate (PHOSLO) 667 MG capsule Take 1 capsule (667 mg total) by mouth 3 (three) times daily with meals. 30 capsule 3   dicyclomine (BENTYL) 20 MG tablet Take 1 tablet (20 mg total) by mouth every 6 (six) hours as needed for spasms (abdominal cramping). 30 tablet 0   gabapentin (NEURONTIN) 300 MG capsule Take 1 capsule (300 mg total) by mouth 2 (two) times daily. 60 capsule 1   hydrOXYzine (VISTARIL) 25 MG capsule Take 1 capsule (25 mg total) by mouth every 8 (eight) hours as needed for anxiety. (Patient taking differently: Take 50 mg by mouth every 8 (eight) hours as needed for anxiety.) 60 capsule 2   lidocaine-prilocaine (EMLA) cream Apply 1 application  topically daily as needed (port site access on dialysis days (Mondays, Wednesdays, Fridays).     losartan (COZAAR) 50 MG tablet Take 1 tablet (50 mg total) by mouth daily. 30 tablet 3   methocarbamol (ROBAXIN) 500 MG tablet Take 1 tablet (500 mg total) by mouth every 8 (eight) hours as needed for muscle spasms. 30 tablet 0   sevelamer carbonate (RENVELA) 800 MG tablet Take 2 tablets (1,600 mg total) by mouth 3 (three) times daily with meals. 180 tablet 0   torsemide (DEMADEX) 100 MG tablet Take 1  tablet (100 mg total) by mouth every morning. 30 tablet 3   traZODone (DESYREL) 100 MG tablet Take 1 tablet (100 mg total) by mouth at bedtime. 30 tablet 1    Musculoskeletal: Strength & Muscle Tone: within normal limits Gait & Station: normal Patient leans: N/A            Psychiatric Specialty Exam:  Presentation  General Appearance:  Appropriate for Environment; Casual  Eye Contact: Good  Speech: Clear and Coherent; Normal Rate  Speech Volume: Normal  Handedness: Right   Mood and Affect   Mood: Euthymic  Affect: Appropriate; Congruent   Thought Process  Thought Processes: Coherent; Linear  Descriptions of Associations:Intact  Orientation:Full (Time, Place and Person)  Thought Content:Logical  History of Schizophrenia/Schizoaffective disorder:No  Duration of Psychotic Symptoms:No data recorded Hallucinations:Hallucinations: None  Ideas of Reference:None  Suicidal Thoughts:Suicidal Thoughts: No  Homicidal Thoughts:Homicidal Thoughts: No   Sensorium  Memory: Immediate Good; Recent Good; Remote Good  Judgment: Fair  Insight: Good   Executive Functions  Concentration: Good  Attention Span: Good  Recall: Good  Fund of Knowledge: Fair  Language: Good   Psychomotor Activity  Psychomotor Activity:Psychomotor Activity: Normal   Assets  Assets: Communication Skills; Desire for Improvement; Resilience   Sleep  Sleep:Sleep: Good   Physical Exam: Physical Exam Vitals and nursing note reviewed.  Constitutional:      Appearance: Normal appearance. She is obese.  Skin:    Capillary Refill: Capillary refill takes less than 2 seconds.  Neurological:     General: No focal deficit present.     Mental Status: She is alert and oriented to person, place, and time. Mental status is at baseline.  Psychiatric:        Mood and Affect: Mood normal.        Behavior: Behavior normal.        Thought Content: Thought content normal.        Judgment: Judgment normal.    ROS Blood pressure (!) 157/103, pulse (!) 102, temperature 98.6 F (37 C), temperature source Oral, resp. rate 18, height 5\' 8"  (1.727 m), weight 93 kg, SpO2 100 %. Body mass index is 31.17 kg/m.  In summary, this patient has had over 30 visits within the last 6 months.  Over the past 2 months she has spent almost the entirety of that time either in the emergency department or in the hospital for various complaints. Throughout those visits, there was concern for secondary  behavior from time to time. On this particular presentation, the patient is not acutely suicidal, and hyper focused on psychotropic medications.  She does not have an active psychiatrist, nor has been able to follow-up as noted above.  As per patient "I used the hospital for more refills and prescriptions ".  Similarly, she was very persistent with this provider about prescribing her psychotropic medications. She remained future oriented throughout the interview, with plans to discharge tomorrow. She had denied suicidal ideation throughout the interview.  Throughout the entire psychiatric evaluation, patient did not, overtly labile, agitated, threatened suicide, become aggressive, and or disrespectful.  She remained calm, cooperative, and pleasant throughout.   The patient is informed that if she ever has concerns for her safety or health she should return to emergency department for evaluation. The most common psychological factors include abnormal coping styles, denial of symptoms, poor adherence to medical treatment, and maladaptive health behavior. These factors are best addressed with outpatient CBT although medications can treat underlying mood disorder(s). If  patient is amenable, referral to outpatient therapy would likely be beneficial.  Treatment Plan Summary:  1. Pt does not meet criteria for inpatient psychiatric care or further psychiatric evaluation at this time - thank you for the consult. Further follow up to consider: Follow-up with outpatient [facility]ry at [facility]  for one of the other practices listed in patient instructions  2. No pertinent labwork available for review at this time.. Medical issues or further labwork to consider: None at this time  Daily contact with patient to assess and evaluate symptoms and progress in treatment, Medication management, and Plan   -Will increase Abilify 10mg  po daily.  Will adjust frequency of Hydroxyzine 25mg  (1-2)  tablets tid prn for anxiety. Encouraged patient to take daily however she declines.  -Discussed legal guardianship and importance of stability. Patient with non compliance with ESRD, Psychiatric conditions,cocaine use, and multiple ED and admissions. Patient seeks comfort in the hospital, and uses dialysis as reason for admission and care. Maybe of patients best interest to contact APS and start discussion of legal guardianship.  -TOC referral for [facility], patient is strongly encouraged to follow up with outpatient provider.  Psychiatric consult service to sign off at this time.   Disposition: No evidence of imminent risk to self or others at present.   Patient does not meet criteria for psychiatric inpatient admission. Supportive therapy provided about ongoing stressors. Refer to IOP. Discussed crisis plan, support from social network, calling 911, coming to the Emergency Department, and calling Suicide Hotline.  Suella Broad, FNP 10/08/2022 12:17 PM

## 2022-10-08 NOTE — Progress Notes (Signed)
Patient ID: Natasha Long, female   DOB: November 05, 1996, 26 y.o.   MRN: [id number] [facility] Progress Note   Assessment/ Plan:   1.  Dizziness/altered mental status: Unclear if this is entirely pharmacological with pattern of waxing and waning mentation noted along with explicit refusal of procedures/care.  In part suspected to have been from uremia secondary to missed hemodialysis treatments along with substance abuse (cocaine).  CT head negative for acute pathology. 2. ESRD: While here in the hospital, continuing a Monday/Wednesday/Friday dialysis schedule (to which she has been accepted at the [facility] on the second shift starting as soon as deemed stable by the primary team.  Case management/social worker assisting with setting up transportation and getting her connected to resources for substance abuse rehabilitation. 3. Anemia: Hgb low, ESA 255mcg qWed. Check iron stores 4. CKD-MBD: Phosphorus 7.5.  We will be increasing binders today 5.  Hypertension: Blood pressure near goal.  Continue current meds 6.  Nutrition: Continue renal diet as permitted by her resources with oral nutritional supplementation if indicated.  Subjective:   Patient with no interaction today.  Does not answer questions   Objective:   BP (!) 157/103 (BP Location: Right Arm)   Pulse (!) 102   Temp 98.6 F (37 C) (Oral)   Resp 18   Ht 5\' 8"  (1.727 m)   Wt 93 kg   LMP  (LMP Unknown) Comment: Patient notes she is not having periods (10/04/22)  SpO2 100%   BMI 31.17 kg/m   Physical Exam: Gen: Tired, lying in bed, not interactive CVS: Normal rate Resp: Bilateral chest rise with no increased work of breathing Abd: Soft, obese, nontender, bowel sounds normal Ext: Trace ankle edema, left upper arm AV fistula with palpable thrill  Labs: BMET Recent Labs  Lab 10/03/22 2049 10/04/22 1547 10/05/22 0345 10/06/22 0525 10/06/22 1433 10/07/22 0900  NA 139 138 135 138  --  137  K  4.5 4.7 4.8 4.5  --  5.0  CL 99 97* 100 96*  --  98  CO2 24 23 20* 24  --  24  GLUCOSE 90 112* 123* 105*  --  113*  BUN 93* 105* 108* 76*  --  94*  CREATININE 11.99* 12.55* 13.22* 9.70*  --  10.75*  CALCIUM 9.3 8.9 8.9 9.5  --  9.6  PHOS  --   --   --   --  7.2* 7.5*   CBC Recent Labs  Lab 10/03/22 2049 10/07/22 0900  WBC 11.0* 5.8  HGB 8.9* 7.2*  HCT 28.1* 23.6*  MCV 97.6 98.7  PLT 180 182    Medications:     ARIPiprazole  7.5 mg Oral Daily   calcium acetate  667 mg Oral TID WC   Chlorhexidine Gluconate Cloth  6 each Topical Q0600   darbepoetin (ARANESP) injection - DIALYSIS  200 mcg Subcutaneous Q Wed-1800   heparin injection (subcutaneous)  5,000 Units Subcutaneous Q8H   labetalol  100 mg Oral TID   losartan  100 mg Oral Daily   sevelamer carbonate  1,600 mg Oral TID WC   sodium chloride flush  3 mL Intravenous Q12H   traZODone  100 mg Oral QHS   Reesa Chew  10/08/2022, 10:25 AM

## 2022-10-08 NOTE — Progress Notes (Addendum)
Patient refused midnight vitals. Patient stated to the nurse aide "leave me alone". Phlebotomy then went in to get patient's labs and patient refused labs as well. Attempted to educate pt regarding the importance of labs and vital signs.

## 2022-10-08 NOTE — Discharge Summary (Signed)
Physician Discharge Summary  Natasha Long FOY:[id number] DOB: 01-24-1996 DOA: 10/03/2022  PCP: [name], MD  Patient left AMA  Admit date: 10/03/2022 Discharge date: 10/08/2022  Time spent: 10 minutes  Recommendations for Outpatient Follow-up:  None patient left AMA  Discharge Diagnoses:  MAIN problem for hospitalization   Systolic heart failure severe mitral regurg ESRD on HD with noncompliance PTSD Polysubstance abuse Frequently leaving AMA  Please see below for itemized issues addressed in [place]- refer to other progress notes for clarity if needed  Discharge Condition: Very guarded  Diet recommendation: None  Filed Weights   10/07/22 0542 10/07/22 0843 10/07/22 1254  Weight: 97.4 kg 97.2 kg 93 kg    History of present illness:  26 year old homeless lady [used to live in [place] and then moved back to [place]]  ESRD 2/2 nephrotic syndrome TTS. 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 Polysubstance abuse-cocaine tobacco marijuana   Chronic pericardial effusion--was supposed to be evaluated last hospitalization with echo after fluid overload resolved   Recently admitted 9/24 through 9/30 with chest pain EF 45-50% volume overload?  Pneumonia Recent admission/discharge 10/31-11/3, left AMA Found by bystanders in the gas station complaining of dizziness smoked crack cocaine the day prior CXR pleural effusion and congestion troponin 700 range nephrology consulted   Return with back and flank pain in the setting of missed dialysis--she was found lethargic at a gas station complaining of dizziness tachycardic tachypneic etc.  Overall was found to be in volume overload after recently having left AMA on 11/23 nephrology started dialysis on patient was counseled about the same She had worsening uremia CT head was negative for any abnormalities she had been discontinued off of Ativan and gabapentin  I received a  call from the nurse informed me patient had walked off the unit as such I did not have time or chance to counsel her or explained to her the risk of leaving AMA-she has done this several times and has left [place] and is at very high risk for death if she continues to use drugs and continue her behaviors   Discharge Exam: Vitals:   10/07/22 1554 10/07/22 1947  BP: (!) 169/95 (!) 157/103  Pulse: (!) 102   Resp: 18   Temp:  98.6 F (37 C)  SpO2: 100%     Subj on day of d/c   Did not examine  General Exam on discharge  No examination done  Discharge Instructions  No instructions given as left AGAINST MEDICAL ADVICE    Allergies  Allergen Reactions   Bupropion Anxiety, Other (See Comments) and Nausea And Vomiting    Caused panic attacks  Caused panic attacks    "Adverse effects"   Prozac [Fluoxetine Hcl] Anxiety and Other (See Comments)    Caused panic attacks   Prednisone Other (See Comments)    Pt stated this med caused pancreatitis    Follow-up Information     [place]. Go on 10/09/2022.   Why: Schedule is Monday/Wednesday/Friday with 11:45 chair time.  For first appointment, arrive at 10:45 to complete paperwork prior to treatment. Contact information: [address] [place] [id number] [phone]                  The results of significant diagnostics from this hospitalization (including imaging, microbiology, ancillary and laboratory) are listed below for reference.    Significant Diagnostic Studies: CT HEAD WO CONTRAST (5MM)  Result Date: 10/04/2022 CLINICAL DATA:  Mental status change, unknown cause EXAM: CT HEAD WITHOUT CONTRAST TECHNIQUE: Contiguous axial images were obtained from the base of the skull through the vertex without intravenous contrast. RADIATION DOSE REDUCTION: This exam was performed according to the departmental dose-optimization program which includes automated exposure control,  adjustment of the mA and/or kV according to patient size and/or use of iterative reconstruction technique. COMPARISON:  09/29/2022 FINDINGS: Brain: No evidence of acute infarction, hemorrhage, hydrocephalus, extra-axial collection or mass lesion/mass effect. Vascular: No hyperdense vessel or unexpected calcification. Skull: Normal. Negative for fracture or focal lesion. Sinuses/Orbits: No acute finding. Other: None. IMPRESSION: No acute intracranial abnormality. Electronically Signed   By: Davina Poke D.O.   On: 10/04/2022 17:53   DG Chest 1 View  Result Date: 10/04/2022 CLINICAL DATA:  Congestive heart failure EXAM: CHEST  1 VIEW COMPARISON:  10/03/2022 FINDINGS: Stable cardiomegaly and pulmonary vascular congestion. Small to moderate bilateral pleural effusions with associated bibasilar opacities. No pneumothorax. IMPRESSION: Persistent findings of CHF with small to moderate bilateral pleural effusions and associated bibasilar opacities. Electronically Signed   By: Davina Poke D.O.   On: 10/04/2022 16:00   DG Chest 2 View  Result Date: 10/03/2022 CLINICAL DATA:  Chest pain and syncope. EXAM: CHEST - 2 VIEW COMPARISON:  September 29, 2022 FINDINGS: The cardiac silhouette is moderately enlarged and unchanged in size. Mild areas of atelectasis and/or infiltrate are seen within the bilateral lung bases. Small to moderate-sized bilateral pleural effusions are seen. No pneumothorax is identified. The visualized skeletal structures are unremarkable. IMPRESSION: 1. Stable cardiomegaly. 2. Mild bibasilar atelectasis and/or infiltrate. 3. Small to moderate-sized bilateral pleural effusions. Electronically Signed   By: Virgina Norfolk M.D.   On: 10/03/2022 21:16   CT HEAD WO CONTRAST (5MM)  Result Date: 09/29/2022 CLINICAL DATA:  Altered mental status EXAM: CT HEAD WITHOUT CONTRAST TECHNIQUE: Contiguous axial images were obtained from the base of the skull through the vertex without intravenous  contrast. RADIATION DOSE REDUCTION: This exam was performed according to the departmental dose-optimization program which includes automated exposure control, adjustment of the mA and/or kV according to patient size and/or use of iterative reconstruction technique. COMPARISON:  05/21/2022 FINDINGS: Brain: No acute intracranial findings are seen. There are no signs of bleeding within the cranium. Ventricles are not dilated. There is no focal edema or mass effect. Vascular: Unremarkable. Skull: Unremarkable. Sinuses/Orbits: There is mild mucosal thickening in the ethmoid sinus. Other: None. IMPRESSION: No acute intracranial findings are seen in noncontrast CT brain. Electronically Signed   By: Elmer Picker M.D.   On: 09/29/2022 16:13   DG Chest 2 View  Result Date: 09/29/2022 CLINICAL DATA:  Shortness of breath. EXAM: CHEST - 2 VIEW COMPARISON:  Portable chest 09/17/2022 FINDINGS: AP Lat 5:42 a.m. The heart is moderately enlarged. Mild perihilar vascular congestion continues to be seen but has improved, with no overt interstitial edema. Small pleural effusions and overlying airspace disease are also improved. The mid and upper lungs are clear. The mediastinal configuration is stable. Slight thoracic dextroscoliosis. IMPRESSION: 1. Improved perihilar vascular congestion and small pleural effusions. No overt edema. 2. Stable cardiomegaly. 3. Improved aeration of the lung bases. Electronically Signed   By: Telford Nab M.D.   On: 09/29/2022 06:05   DG Chest Portable 1 View  Result Date: 09/17/2022 CLINICAL DATA:  Chest pain EXAM: PORTABLE CHEST 1 VIEW COMPARISON:  09/14/2022 FINDINGS: Cardiomegaly, vascular congestion. Bilateral pleural effusions, left larger than right. Bilateral lower lobe airspace opacities, left worse than right.  No pneumothorax. No acute bony abnormality. IMPRESSION: Bilateral effusions with bilateral lower lobe airspace disease, both worse on the left. Cardiomegaly, vascular  congestion. Electronically Signed   By: Rolm Baptise M.D.   On: 09/17/2022 02:07    Microbiology: No results found for this or any previous visit (from the past 240 hour(s)).   Labs: Basic Metabolic Panel: Recent Labs  Lab 10/04/22 1547 10/05/22 0345 10/06/22 0525 10/06/22 1433 10/07/22 0900 10/08/22 1056  NA 138 135 138  --  137 138  K 4.7 4.8 4.5  --  5.0 4.6  CL 97* 100 96*  --  98 98  CO2 23 20* 24  --  24 28  GLUCOSE 112* 123* 105*  --  113* 108*  BUN 105* 108* 76*  --  94* 66*  CREATININE 12.55* 13.22* 9.70*  --  10.75* 8.42*  CALCIUM 8.9 8.9 9.5  --  9.6 9.7  PHOS  --   --   --  7.2* 7.5*  --    Liver Function Tests: Recent Labs  Lab 10/07/22 0900  ALBUMIN 2.6*   No results for input(s): "LIPASE", "AMYLASE" in the last 168 hours. No results for input(s): "AMMONIA" in the last 168 hours. CBC: Recent Labs  Lab 10/03/22 2049 10/07/22 0900 10/08/22 1056  WBC 11.0* 5.8 6.5  NEUTROABS  --   --  4.8  HGB 8.9* 7.2* 7.4*  HCT 28.1* 23.6* 24.0*  MCV 97.6 98.7 98.0  PLT 180 182 212   Cardiac Enzymes: Recent Labs  Lab 10/04/22 1547  CKTOTAL 180   BNP: BNP (last 3 results) Recent Labs    08/24/22 1024 08/30/22 2341 09/17/22 0143  BNP 3,410.3* 1,902.1* 2,032.4*    ProBNP (last 3 results) No results for input(s): "PROBNP" in the last 8760 hours.  CBG: Recent Labs  Lab 10/02/22 0814  GLUCAP 131*       Signed:  Nita Sells MD   [facility] 10/08/2022, 2:08 PM

## 2022-10-09 NOTE — Progress Notes (Signed)
10/08/22, 1629: CSW received email from [place] with [employer] regarding application for [employer] program. CSW is informed "She will be put on a waitlist, at this time I have no timeline of if/when she will be accepted. I will reach out to all contact information we have if she is selected."

## 2022-10-12 ENCOUNTER — Telehealth: Payer: Self-pay

## 2022-10-12 NOTE — Telephone Encounter (Signed)
Transition Care Management Unsuccessful Follow-up Telephone Call  Date of discharge and from where:  10/08/2022, [facility]-left AMA  Attempts:  1st Attempt  Reason for unsuccessful TCM follow-up call:  Unable to reach patient- I called [phone] and the recording stated that the person is not accepting calls at this time. I also called [phone] and the number was not in service.

## 2022-10-13 ENCOUNTER — Telehealth: Payer: Self-pay

## 2022-10-13 NOTE — Telephone Encounter (Signed)
Transition Care Management Unsuccessful Follow-up Telephone Call  Date of discharge and from where:    [facility]   Attempts:  2nd Attempt  Reason for unsuccessful TCM follow-up call:  Unable to reach patient- I called [phone] and  [phone] and the recordings on both numbers stated that the call could not be completed as dialed

## 2022-10-14 ENCOUNTER — Telehealth: Payer: Self-pay

## 2022-10-14 NOTE — Telephone Encounter (Signed)
Transition Care Management Unsuccessful Follow-up Telephone Call  Date of discharge and from where:  10/08/2022, [facility]-left AMA     Attempts:  3rd Attempt  Reason for unsuccessful TCM follow-up call:  Unable to reach patient - I called [phone] and  [phone] and the recordings on both numbers stated that the call could not be completed as dialed      Letter sent to patient requesting she contact [facility] to schedule a follow up appointment as we have not bee able to reach her

## 2022-10-21 ENCOUNTER — Emergency Department (HOSPITAL_COMMUNITY)
Admission: EM | Admit: 2022-10-21 | Discharge: 2022-10-22 | Disposition: A | Discharge status: Home / Self Care | Payer: Medicaid Other | Attending: Emergency Medicine | Admitting: Emergency Medicine

## 2022-10-21 DIAGNOSIS — N186 End stage renal disease: Secondary | ICD-10-CM

## 2022-10-21 DIAGNOSIS — M545 Low back pain, unspecified: Secondary | ICD-10-CM

## 2022-10-21 DIAGNOSIS — G8929 Other chronic pain: Secondary | ICD-10-CM | POA: Diagnosis not present

## 2022-10-21 DIAGNOSIS — Z992 Dependence on renal dialysis: Secondary | ICD-10-CM | POA: Diagnosis not present

## 2022-10-21 DIAGNOSIS — E875 Hyperkalemia: Secondary | ICD-10-CM

## 2022-10-21 LAB — I-STAT BETA HCG BLOOD, ED (MC, WL, AP ONLY): I-stat hCG, quantitative: <5 m[IU]/mL (ref <5)

## 2022-10-21 LAB — COMPREHENSIVE METABOLIC PANEL
ALT: 43 U/L (ref 0–44)
AST: 62 U/L — ABNORMAL HIGH (ref 15–41)
Albumin: 3.4 g/dL — ABNORMAL LOW (ref 3.5–5.0)
Alkaline Phosphatase: 77 U/L (ref 38–126)
Anion gap: 23 — ABNORMAL HIGH (ref 5–15)
BUN: 155 mg/dL — ABNORMAL HIGH (ref 6–20)
CO2: 16 mmol/L — ABNORMAL LOW (ref 22–32)
Calcium: 9.3 mg/dL (ref 8.9–10.3)
Chloride: 102 mmol/L (ref 98–111)
Creatinine, Ser: 19.06 mg/dL — ABNORMAL HIGH (ref 0.44–1.00)
GFR, Estimated: 2 mL/min — ABNORMAL LOW (ref >60)
Glucose, Bld: 112 mg/dL — ABNORMAL HIGH (ref 70–99)
Potassium: 6.7 mmol/L (ref 3.5–5.1)
Sodium: 141 mmol/L (ref 135–145)
Total Bilirubin: 0.6 mg/dL (ref 0.3–1.2)
Total Protein: 7.4 g/dL (ref 6.5–8.1)

## 2022-10-21 LAB — CBC WITH DIFFERENTIAL/PLATELET
Abs Immature Granulocytes: 0.02 10*3/uL (ref 0.00–0.07)
Basophils Absolute: 0 10*3/uL (ref 0.0–0.1)
Basophils Relative: 0 %
Eosinophils Absolute: 0.1 10*3/uL (ref 0.0–0.5)
Eosinophils Relative: 1 %
HCT: 30.8 % — ABNORMAL LOW (ref 36.0–46.0)
Hemoglobin: 9.7 g/dL — ABNORMAL LOW (ref 12.0–15.0)
Immature Granulocytes: 0 %
Lymphocytes Relative: 15 %
Lymphs Abs: 1.3 10*3/uL (ref 0.7–4.0)
MCH: 31.6 pg (ref 26.0–34.0)
MCHC: 31.5 g/dL (ref 30.0–36.0)
MCV: 100.3 fL — ABNORMAL HIGH (ref 80.0–100.0)
Monocytes Absolute: 0.6 10*3/uL (ref 0.1–1.0)
Monocytes Relative: 8 %
Neutro Abs: 6.5 10*3/uL (ref 1.7–7.7)
Neutrophils Relative %: 76 %
Platelets: 238 10*3/uL (ref 150–400)
RBC: 3.07 MIL/uL — ABNORMAL LOW (ref 3.87–5.11)
RDW: 23.2 % — ABNORMAL HIGH (ref 11.5–15.5)
WBC: 8.6 10*3/uL (ref 4.0–10.5)
nRBC: 0 % (ref 0.0–0.2)

## 2022-10-21 LAB — ETHANOL: Alcohol, Ethyl (B): <10 mg/dL (ref <10)

## 2022-10-21 MED ORDER — ALBUTEROL SULFATE (2.5 MG/3ML) 0.083% IN NEBU
10.0000 mg | INHALATION_SOLUTION | Freq: Once | RESPIRATORY_TRACT | Status: DC
  Filled 2022-10-21: qty 12

## 2022-10-21 MED ORDER — LOSARTAN POTASSIUM 50 MG PO TABS
50.0000 mg | ORAL_TABLET | Freq: Once | ORAL | Status: AC
  Administered 2022-10-21: 50 mg via ORAL
  Filled 2022-10-21: qty 1

## 2022-10-21 MED ORDER — SODIUM BICARBONATE 8.4 % IV SOLN
50.0000 meq | Freq: Once | INTRAVENOUS | Status: AC
  Administered 2022-10-21: 50 meq via INTRAVENOUS
  Filled 2022-10-21: qty 50

## 2022-10-21 MED ORDER — FUROSEMIDE 10 MG/ML IJ SOLN
40.0000 mg | Freq: Once | INTRAMUSCULAR | Status: DC
  Filled 2022-10-21: qty 4

## 2022-10-21 MED ORDER — CALCIUM GLUCONATE-NACL 1-0.675 GM/50ML-% IV SOLN
1.0000 g | Freq: Once | INTRAVENOUS | Status: AC
  Administered 2022-10-21: 1000 mg via INTRAVENOUS
  Filled 2022-10-21: qty 50

## 2022-10-21 MED ORDER — SODIUM ZIRCONIUM CYCLOSILICATE 10 G PO PACK
10.0000 g | PACK | Freq: Once | ORAL | Status: AC
  Administered 2022-10-21: 10 g via ORAL
  Filled 2022-10-21: qty 1

## 2022-10-21 MED ORDER — OXYCODONE-ACETAMINOPHEN 5-325 MG PO TABS
1.0000 | ORAL_TABLET | Freq: Once | ORAL | Status: AC
  Administered 2022-10-21: 1 via ORAL
  Filled 2022-10-21: qty 1

## 2022-10-21 NOTE — ED Notes (Signed)
Rationale given for no more gingerale. Ice chips given. Pt crying, acting out, beligeraent. Plan, process, explained with rationale.

## 2022-10-21 NOTE — ED Notes (Signed)
Sobbing crying yelling. NT at [facility]

## 2022-10-21 NOTE — Discharge Instructions (Signed)
You need to go to your dialysis center as planned and take your medications as prescribed.  You had to have dialysis today for high potassium levels.  This can kill you if you do not continue to get dialysis as planned.  Please follow up with your doctor in the next 2-3 days to discuss your elevated blood pressures and your current medication regimen.  Return to the emergency department if you develop any sudden/severe headache, confusion, slurred speech, worsening vision, weakness or numbness of any arm or leg, or for any chest pain, pressure, or trouble breathing.

## 2022-10-21 NOTE — ED Notes (Signed)
ED PA at BS 

## 2022-10-21 NOTE — ED Notes (Addendum)
Patient is in room, asleep. Not interested in answering questions.

## 2022-10-21 NOTE — ED Notes (Signed)
Pt resting/ sleeping, arousable to voice, intermittently restless, pre-occupied with sleep, non participatory, not answering some questions, not following some commands. BP high, VSS.

## 2022-10-21 NOTE — Progress Notes (Signed)
Brief progress note  Pt presented to ED after missing several HD treatments, labs consistent with this and K is 6.7.  Plan for HD today ASAP.    She will go back to ED post HD for reassessment of disposition.

## 2022-10-21 NOTE — ED Provider Notes (Signed)
  Physical Exam  BP (!) 166/105   Pulse (!) 103   Temp 98 F (36.7 C) (Oral)   Resp 17   LMP  (LMP Unknown) Comment: Patient notes she is not having periods (10/04/22)  SpO2 94%   Physical Exam  Procedures  Procedures  ED Course / MDM   Clinical Course as of 10/21/22 2311  Wed Oct 21, 2022  2309 I reassessed patient after return from hemodialysis.  She has completed entire session.  She has underlying psychiatric disease but is not displaying any evidence of acute psychosis she is just noncompliant with exam.  She will not answer questions but then if I say we need to give her Narcan she wakes up and says you need to give me Narcan I am fine. Then she is requesting for food to be given to her and soda. Additionally, the staff has witnessed her ambulating with steady gait to the narrow getting food like coming back here.  She is intermittently moaning on her bed but then will wake up and follow all my commands.  She is not displaying any focal neurologic deficits.  She is hypertension at baseline have ordered for home losartan.  However I am not concerned for hypertensive emergency.  She has been appropriately dialyzed today.  She is safe for discharge back to the community with strict return precautions. [VB]    Clinical Course User Index [VB] Elgie Congo, MD   Medical Decision Making Risk Prescription drug management.         Elgie Congo, MD 10/21/22 [phone]

## 2022-10-21 NOTE — ED Notes (Addendum)
Alert, NAD, agitated, interactive. Resps e/u, speaking in clear complete sentences. To HD with transport via stretcher. Semi cooperative. Refusing care from this RN.

## 2022-10-21 NOTE — ED Notes (Addendum)
Remains uncooperative, demanding, refusing some of the plan of care. Removed self from monitor. Eating sandwich and gingerale. Refused albuterol nebs and lasix. EDPA notified/ aware. Pending HD.

## 2022-10-21 NOTE — ED Provider Notes (Cosign Needed Addendum)
[place] EMERGENCY DEPARTMENT Provider Note   CSN: [id number] Arrival date & time: 10/21/22  0901     History ESRD Chief Complaint  Patient presents with   Back Pain    Natasha Long is a 26 y.o. female.  26 year old female with a past medical history of ESRD on dialysis known dates that she has not been in the past 3 weeks.  Patient reports she also has not been taking her medications at home.  She is endorsing generalized fatigue.  Level 5 caveat due to patient's mental status.   The history is provided by the patient.  Back Pain      Home Medications Prior to Admission medications   Medication Sig Start Date End Date Taking? Authorizing Provider  ARIPiprazole (ABILIFY) 5 MG tablet Take 1.5 tablets (7.5 mg total) by mouth daily. Patient taking differently: Take 10 mg by mouth daily. 09/02/22   Rai, Ripudeep K, MD  b complex-vitamin c-folic acid (NEPHRO-VITE) 0.8 MG TABS tablet Take 1 tablet by mouth daily. 09/02/22   Rai, Vernelle Emerald, MD  calcitRIOL (ROCALTROL) 0.25 MCG capsule Take 1 capsule (0.25 mcg total) by mouth Every Tuesday,Thursday,and Saturday with dialysis. Patient taking differently: Take 0.25 mcg by mouth every Monday, Wednesday, and Friday with hemodialysis. 09/03/22   Rai, Vernelle Emerald, MD  calcium acetate (PHOSLO) 667 MG capsule Take 1 capsule (667 mg total) by mouth 3 (three) times daily with meals. 09/02/22   Rai, Vernelle Emerald, MD  dicyclomine (BENTYL) 20 MG tablet Take 1 tablet (20 mg total) by mouth every 6 (six) hours as needed for spasms (abdominal cramping). 09/02/22   Rai, Vernelle Emerald, MD  gabapentin (NEURONTIN) 300 MG capsule Take 1 capsule (300 mg total) by mouth 2 (two) times daily. 09/02/22   Rai, Vernelle Emerald, MD  hydrOXYzine (VISTARIL) 25 MG capsule Take 1 capsule (25 mg total) by mouth every 8 (eight) hours as needed for anxiety. Patient taking differently: Take 50 mg by mouth every 8 (eight) hours as needed for anxiety. 09/02/22   Rai,  Vernelle Emerald, MD  lidocaine-prilocaine (EMLA) cream Apply 1 application  topically daily as needed (port site access on dialysis days (Mondays, Wednesdays, Fridays). 03/06/21   [provider]  losartan (COZAAR) 50 MG tablet Take 1 tablet (50 mg total) by mouth daily. 09/02/22   Rai, Vernelle Emerald, MD  methocarbamol (ROBAXIN) 500 MG tablet Take 1 tablet (500 mg total) by mouth every 8 (eight) hours as needed for muscle spasms. 09/02/22   Rai, Vernelle Emerald, MD  sevelamer carbonate (RENVELA) 800 MG tablet Take 2 tablets (1,600 mg total) by mouth 3 (three) times daily with meals. 09/02/22   Rai, Ripudeep Raliegh Ip, MD  torsemide (DEMADEX) 100 MG tablet Take 1 tablet (100 mg total) by mouth every morning. 09/02/22   Rai, Vernelle Emerald, MD  traZODone (DESYREL) 100 MG tablet Take 1 tablet (100 mg total) by mouth at bedtime. 09/02/22   Rai, Vernelle Emerald, MD      Allergies    Bupropion, Prozac [fluoxetine hcl], and Prednisone    Review of Systems   Review of Systems  Unable to perform ROS: Mental status change  Musculoskeletal:  Positive for back pain.    Physical Exam Updated Vital Signs BP (!) 173/122   Pulse (!) 102   Temp 97.6 F (36.4 C)   Resp (!) 26   LMP  (LMP Unknown) Comment: Patient notes she is not having periods (10/04/22)  SpO2 93%  Physical Exam Vitals  and nursing note reviewed.  Constitutional:      Appearance: Normal appearance.     Comments: Sleeping in the bed, refusing to answer questions.   HENT:     Head: Normocephalic and atraumatic.     Comments: No signs of trauma to her head.     Mouth/Throat:     Mouth: Mucous membranes are moist.  Cardiovascular:     Rate and Rhythm: Normal rate.  Pulmonary:     Effort: Pulmonary effort is normal.  Abdominal:     General: Abdomen is flat.  Musculoskeletal:     Cervical back: Normal range of motion and neck supple.  Skin:    General: Skin is warm and dry.  Neurological:     Mental Status: She is alert and oriented to person, place, and  time.     ED Results / Procedures / Treatments   Labs (all labs ordered are listed, but only abnormal results are displayed) Labs Reviewed  COMPREHENSIVE METABOLIC PANEL - Abnormal; Notable for the following components:      Result Value   Potassium 6.7 (*)    CO2 16 (*)    Glucose, Bld 112 (*)    BUN 155 (*)    Creatinine, Ser 19.06 (*)    Albumin 3.4 (*)    AST 62 (*)    GFR, Estimated 2 (*)    Anion gap 23 (*)    All other components within normal limits  CBC WITH DIFFERENTIAL/PLATELET - Abnormal; Notable for the following components:   RBC 3.07 (*)    Hemoglobin 9.7 (*)    HCT 30.8 (*)    MCV 100.3 (*)    RDW 23.2 (*)    All other components within normal limits  ETHANOL  RAPID URINE DRUG SCREEN, HOSP PERFORMED  URINALYSIS, ROUTINE W REFLEX MICROSCOPIC  I-STAT BETA HCG BLOOD, ED ([facility], [facility], [facility] ONLY)    EKG None  Radiology No results found.  Procedures Procedures    Medications Ordered in ED Medications  furosemide (LASIX) injection 40 mg (40 mg Intravenous Patient Refused/Not Given 10/21/22 1407)  albuterol (PROVENTIL) (2.5 MG/3ML) 0.083% nebulizer solution 10 mg (10 mg Nebulization Patient Refused/Not Given 10/21/22 1407)  oxyCODONE-acetaminophen (PERCOCET/ROXICET) 5-325 MG per tablet 1 tablet (1 tablet Oral Given 10/21/22 0911)  sodium zirconium cyclosilicate (LOKELMA) packet 10 g (10 g Oral Given 10/21/22 1408)  calcium gluconate 1 g/ 50 mL sodium chloride IVPB (0 mg Intravenous Stopped 10/21/22 1433)  sodium bicarbonate injection 50 mEq (50 mEq Intravenous Given 10/21/22 1408)    ED Course/ Medical Decision Making/ A&P                           Medical Decision Making Risk Prescription drug management.   This patient presents to the ED for concern of back pain, this involves a number of treatment options, and is a complaint that carries with it a high risk of complications and morbidity.  The differential diagnosis includes ESRD.    Co  morbidities: Discussed in HPI   Brief History:  Patient here complaining of back pain, she has had dialysis in approximately 3 weeks, has not been taking any of her medication.  She is uncooperative, unable to provide any history.  EMR reviewed including pt PMHx, past surgical history and past visits to ER.   See HPI for more details   Lab Tests:  I ordered and independently interpreted labs.  The pertinent results include:  CMP with a potassium of 6.7, remarkable for hyperkalemia without any EKG changes.  Her creatinine is 19.06.  CBC with no leukocytosis, hemoglobin is at her baseline.  Beta hCG is negative.  Cardiac Monitoring:  The patient was maintained on a cardiac monitor.  I personally viewed and interpreted the cardiac monitored which showed an underlying rhythm of: NSR, no EKG changes EKG non-ischemic   Medicines ordered:  I ordered medication including calcium gluconate, sodium bicarb, albuterol for hyperkalemia Reevaluation of the patient after these medicines showed that the patient stayed the same I have reviewed the patients home medicines and have made adjustments as needed  Consults:  I requested consultation with nephrology Dr. Loleta Books,  and discussed lab and imaging findings as well as pertinent plan - they recommend: Dialysis immediately today, will try to expedite this.  Reevaluation:  After the interventions noted above I re-evaluated patient and found that they have :stayed the same   Social Determinants of Health:  The patient's social determinants of health were a factor in the care of this patient  Problem List / ED Course:  Patient here with noncompliance to dialysis for the past 3 weeks, complaining of back pain.  Unable to obtain any history as patient is refusing to provide any at this time, she did ambulate to primary in order to obtain food and refused her vitals to be taken until her meal was completed.  Her vitals are remarkable for  hypertension, no tachycardia or hypoxia.  She is able to speak in full sentences.  Labs were at her baseline however worsening creatinine along with BUN, spoke to nephrology who recommended emergent dialysis which be arranged for today.  Patient is mentating well, started treatment for her potassium of 6.7 with bicarb, Lokelma, utero but no dextrose was given as patient glucose was 112.  Patient will need dialysis along with disposition from the emergency department.   Dispostion:  After consideration of the diagnostic results and the patients response to treatment, I feel that the patent would benefit from dialysis emergent.     Portions of this note were generated with L[employer]. Dictation errors may occur despite best attempts at proofreading.  Final Clinical Impression(s) / ED Diagnoses Final diagnoses:  Chronic low back pain, unspecified back pain laterality, unspecified whether sciatica present    Rx / DC Orders ED Discharge Orders     None         Janeece Fitting, PA-C 10/21/22 1536    Janeece Fitting, PA-C 10/21/22 1538    Malvin Johns, MD 10/22/22 [phone]

## 2022-10-21 NOTE — ED Notes (Signed)
Calmer, NAD.

## 2022-10-21 NOTE — ED Triage Notes (Signed)
Patient BIB [facility] of bilateral lower back pain, left arm pain, and bilateral leg pain that started three days ago. Patient has not gone to her dialysis treatments in the last three weeks. BP 181/123, HR 98. CBG 116. Patient is alert and tearful in triage.

## 2022-10-21 NOTE — ED Notes (Signed)
Pt currently not in the waiting room. Will update vitals once she return.

## 2022-10-21 NOTE — ED Notes (Addendum)
Pt refusing EKG from this NT.

## 2022-10-21 NOTE — ED Notes (Signed)
Tried rechecking vitals, patient refused until she is finished eating

## 2022-10-21 NOTE — ED Notes (Signed)
Pt can't void renal pt

## 2022-10-21 NOTE — Progress Notes (Signed)
Received patient in bed to unit.  Alert and oriented.  Informed consent signed and in chart.   Treatment initiated: 1652 Treatment completed: 1925  Patient tolerated well.  Transported back to the room  Alert, without acute distress.  Hand-off given to patient's nurse.   Access used: [place] Access issues: none  Total UF removed: 3100 Medication(s) given: none Post HD VS: 98, 185/112(130), HR-99, RR-20, SP02-99 Post HD weight: unable to get   Machine clotted off 28 minutes left in treatment. Rinsed back.  [facility]

## 2022-10-21 NOTE — ED Provider Triage Note (Signed)
Emergency Medicine Provider Triage Evaluation Note  Natasha Long , a 26 y.o. female  was evaluated in triage.  Pt complains of low back pain bilaterally, left arm pain bilateral lower leg pain.  Has missed dialysis for 3 weeks has not been taking her medication. HTN via EMS  Review of Systems  Positive: Pain for 3 days Negative: Loss of sensation  Physical Exam  LMP  (LMP Unknown) Comment: Patient notes she is not having periods (10/04/22) Gen:   Awake, no distress  uncomfortable appearing Resp:  Normal effort MSK:   Moves extremities without difficulty   Medical Decision Making  Medically screening exam initiated at 9:01 AM.  Appropriate orders placed.  Natasha Long was informed that the remainder of the evaluation will be completed by another provider, this initial triage assessment does not replace that evaluation, and the importance of remaining in the ED until their evaluation is complete.   Natasha Muskrat, MD 10/21/22 [phone]

## 2022-10-21 NOTE — ED Notes (Signed)
This NT asked pt for urine sample, pt stated "cannot urinate at this time". Urine cup given to pt, notified pt that pt can be seen quicker with urine sample. Pt acknowledges.

## 2022-10-22 NOTE — ED Notes (Signed)
Patient is currently in room. [employer] at door and bedside. Patient is currently refusing to leave. Charge and MD are aware.

## 2022-10-22 NOTE — ED Notes (Signed)
Patient verbalizes understanding of discharge instructions. Opportunity for questioning and answers were provided. Armband removed by staff, pt wheeled out to lobby and provided a bus pass
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
## 2022-10-22 NOTE — ED Notes (Signed)
Began speaking with patient to discuss discharge. Patient states she is not leaving and is now suicidal. MD is aware.

## 2022-10-23 ENCOUNTER — Other Ambulatory Visit: Payer: Self-pay

## 2022-10-23 ENCOUNTER — Emergency Department (HOSPITAL_COMMUNITY)
Admission: EM | Admit: 2022-10-23 | Discharge: 2022-10-23 | Disposition: A | Discharge status: Home / Self Care | Payer: Medicaid Other | Attending: Emergency Medicine | Admitting: Emergency Medicine

## 2022-10-23 DIAGNOSIS — M79661 Pain in right lower leg: Secondary | ICD-10-CM | POA: Insufficient documentation

## 2022-10-23 DIAGNOSIS — M79604 Pain in right leg: Secondary | ICD-10-CM

## 2022-10-23 DIAGNOSIS — Z79899 Other long term (current) drug therapy: Secondary | ICD-10-CM | POA: Diagnosis not present

## 2022-10-23 DIAGNOSIS — Z992 Dependence on renal dialysis: Secondary | ICD-10-CM | POA: Insufficient documentation

## 2022-10-23 DIAGNOSIS — R220 Localized swelling, mass and lump, head: Secondary | ICD-10-CM | POA: Diagnosis not present

## 2022-10-23 DIAGNOSIS — M545 Low back pain, unspecified: Secondary | ICD-10-CM | POA: Diagnosis not present

## 2022-10-23 DIAGNOSIS — N186 End stage renal disease: Secondary | ICD-10-CM | POA: Insufficient documentation

## 2022-10-23 DIAGNOSIS — M79662 Pain in left lower leg: Secondary | ICD-10-CM | POA: Diagnosis not present

## 2022-10-23 DIAGNOSIS — M549 Dorsalgia, unspecified: Secondary | ICD-10-CM | POA: Diagnosis present

## 2022-10-23 DIAGNOSIS — I132 Hypertensive heart and chronic kidney disease with heart failure and with stage 5 chronic kidney disease, or end stage renal disease: Secondary | ICD-10-CM | POA: Diagnosis not present

## 2022-10-23 DIAGNOSIS — Z5900 Homelessness unspecified: Secondary | ICD-10-CM | POA: Insufficient documentation

## 2022-10-23 DIAGNOSIS — I509 Heart failure, unspecified: Secondary | ICD-10-CM | POA: Diagnosis not present

## 2022-10-23 MED ORDER — OXYCODONE-ACETAMINOPHEN 5-325 MG PO TABS
1.0000 | ORAL_TABLET | Freq: Once | ORAL | Status: AC
  Administered 2022-10-23: 1 via ORAL
  Filled 2022-10-23: qty 1

## 2022-10-23 MED ORDER — LOSARTAN POTASSIUM 50 MG PO TABS
50.0000 mg | ORAL_TABLET | Freq: Once | ORAL | Status: AC
  Administered 2022-10-23: 50 mg via ORAL
  Filled 2022-10-23: qty 1

## 2022-10-23 MED ORDER — METHOCARBAMOL 500 MG PO TABS
500.0000 mg | ORAL_TABLET | Freq: Once | ORAL | Status: AC
  Administered 2022-10-23: 500 mg via ORAL
  Filled 2022-10-23: qty 1

## 2022-10-23 NOTE — ED Triage Notes (Signed)
Pt arrives with [facility] from jail with c/o lower back pain and bilateral lower leg pain. Pt seen for same a couple of days ago. Pt is a HD pt and goes on MWF. Pts last HD treatment was Wednesday. Pt endorse some facial swelling after HD treatment.

## 2022-10-23 NOTE — Discharge Instructions (Signed)
Please use Tylenol for pain.  You may use 1000 mg of Tylenol every 6 hours.  Not to exceed 4 g of Tylenol within 24 hours.  You can use the muscle relaxant you have been previously prescribed in addition to the above to help with any breakthrough pain.  You can take it up to twice daily.  It is safe to take at night, but I would be cautious taking it during the day as it can cause some drowsiness.  Make sure that you are feeling awake and alert before you get behind the wheel of a car or operate a motor vehicle.  It is not a narcotic pain medication so you are able to take it if it is not making you drowsy and still pilot a vehicle or machinery safely.

## 2022-10-23 NOTE — ED Provider Notes (Signed)
[place] EMERGENCY DEPARTMENT Provider Note   CSN: [id number] Arrival date & time: 10/23/22  0119     History  Chief Complaint  Patient presents with   Leg Pain   Back Pain    Natasha Long is a 26 y.o. female significant past medical history of homelessness, PTSD, hypertension, borderline personality, CVA, ESRD on dialysis, heart failure, who presents with concern for back pain, lower leg pain.  Patient was seen for the same a few days ago.  She is a hemodialysis patient, normally Monday Wednesday Friday, last received hemodialysis yesterday.  She reports some mild facial swelling after her dialysis treatment.  She rates her pain 10/10 at this time.  He has not taken anything for the pain prior to arrival.  On my evaluation patient reports no recent falls, injuries, numbness, urinary or fecal incontinence.  It seems that she is mostly concerned about eating as she is requesting food on arrival and has requested multiple times.   Leg Pain Associated symptoms: back pain   Back Pain Associated symptoms: leg pain        Home Medications Prior to Admission medications   Medication Sig Start Date End Date Taking? Authorizing Provider  ARIPiprazole (ABILIFY) 5 MG tablet Take 1.5 tablets (7.5 mg total) by mouth daily. Patient taking differently: Take 10 mg by mouth daily. 09/02/22   Rai, Ripudeep K, MD  b complex-vitamin c-folic acid (NEPHRO-VITE) 0.8 MG TABS tablet Take 1 tablet by mouth daily. 09/02/22   Rai, Vernelle Emerald, MD  calcitRIOL (ROCALTROL) 0.25 MCG capsule Take 1 capsule (0.25 mcg total) by mouth Every Tuesday,Thursday,and Saturday with dialysis. Patient taking differently: Take 0.25 mcg by mouth every Monday, Wednesday, and Friday with hemodialysis. 09/03/22   Rai, Vernelle Emerald, MD  calcium acetate (PHOSLO) 667 MG capsule Take 1 capsule (667 mg total) by mouth 3 (three) times daily with meals. 09/02/22   Rai, Vernelle Emerald, MD  dicyclomine (BENTYL) 20 MG  tablet Take 1 tablet (20 mg total) by mouth every 6 (six) hours as needed for spasms (abdominal cramping). 09/02/22   Rai, Vernelle Emerald, MD  gabapentin (NEURONTIN) 300 MG capsule Take 1 capsule (300 mg total) by mouth 2 (two) times daily. 09/02/22   Rai, Vernelle Emerald, MD  hydrOXYzine (VISTARIL) 25 MG capsule Take 1 capsule (25 mg total) by mouth every 8 (eight) hours as needed for anxiety. Patient taking differently: Take 50 mg by mouth every 8 (eight) hours as needed for anxiety. 09/02/22   Rai, Vernelle Emerald, MD  lidocaine-prilocaine (EMLA) cream Apply 1 application  topically daily as needed (port site access on dialysis days (Mondays, Wednesdays, Fridays). 03/06/21   [provider]  losartan (COZAAR) 50 MG tablet Take 1 tablet (50 mg total) by mouth daily. 09/02/22   Rai, Vernelle Emerald, MD  methocarbamol (ROBAXIN) 500 MG tablet Take 1 tablet (500 mg total) by mouth every 8 (eight) hours as needed for muscle spasms. 09/02/22   Rai, Vernelle Emerald, MD  sevelamer carbonate (RENVELA) 800 MG tablet Take 2 tablets (1,600 mg total) by mouth 3 (three) times daily with meals. 09/02/22   Rai, Ripudeep Raliegh Ip, MD  torsemide (DEMADEX) 100 MG tablet Take 1 tablet (100 mg total) by mouth every morning. 09/02/22   Rai, Vernelle Emerald, MD  traZODone (DESYREL) 100 MG tablet Take 1 tablet (100 mg total) by mouth at bedtime. 09/02/22   Mendel Corning, MD      Allergies    Bupropion, Prozac [fluoxetine hcl],  and Prednisone    Review of Systems   Review of Systems  Musculoskeletal:  Positive for back pain.  All other systems reviewed and are negative.   Physical Exam Updated Vital Signs BP (!) 170/112 (BP Location: Right Arm)   Pulse 97   Temp 98.3 F (36.8 C) (Oral)   Resp 20   Ht 5\' 8"  (1.727 m)   Wt 90.7 kg   LMP  (LMP Unknown) Comment: Patient notes she is not having periods (10/04/22)  SpO2 100%   BMI 30.41 kg/m  Physical Exam Vitals and nursing note reviewed.  Constitutional:      General: She is not in acute  distress.    Appearance: Normal appearance.  HENT:     Head: Normocephalic and atraumatic.  Eyes:     General:        Right eye: No discharge.        Left eye: No discharge.  Cardiovascular:     Rate and Rhythm: Normal rate and regular rhythm.  Pulmonary:     Effort: Pulmonary effort is normal. No respiratory distress.  Musculoskeletal:        General: No deformity.     Comments: Patient with some tenderness in the midline lumbar spine, and lumbar paraspinous muscles, no midline tenderness in the cervical, thoracic region.  No overlying skin changes.  She is intact and 5/5 bilateral upper and lower extremities.  She has normal sensation throughout.  Skin:    General: Skin is warm and dry.  Neurological:     Mental Status: She is alert and oriented to person, place, and time.  Psychiatric:        Mood and Affect: Mood normal.        Behavior: Behavior normal.     ED Results / Procedures / Treatments   Labs (all labs ordered are listed, but only abnormal results are displayed) Labs Reviewed - No data to display  EKG None  Radiology No results found.  Procedures Procedures    Medications Ordered in ED Medications  oxyCODONE-acetaminophen (PERCOCET/ROXICET) 5-325 MG per tablet 1 tablet (1 tablet Oral Given 10/23/22 0151)  methocarbamol (ROBAXIN) tablet 500 mg (500 mg Oral Given 10/23/22 0151)  losartan (COZAAR) tablet 50 mg (50 mg Oral Given 10/23/22 0151)    ED Course/ Medical Decision Making/ A&P                           Medical Decision Making  This is a chronically ill 26 year old female with ESRD secondary to nephrotic syndrome, history of bipolar, substance abuse, heart failure, who presents with concern for back pain, lower leg pain.  She was seen and evaluated for the symptoms just less than 48 hours ago, as well as was dialyzed yesterday.  On my exam she is neurovascularly intact, she can move both lower limbs spontaneously.  As her exam is somewhat out of  proportion with findings in terms of her yelling and response to palpation of her low back, as well as her multiple repeated demands for a sandwich, I suspect that patient may have had ulterior motives to emergency department assessment today.  Nonetheless she appears well, I have low clinical suspicion for fluid overLoad or abnormal electrolytes as she was just dialyzed.  She is stable at this time for discharge to jail, encouraged Tylenol, muscle relaxant that she has been previously prescribed, and return to dialysis tomorrow.  Patient understands and agrees to plan and  is discharged in stable condition at this time. Final Clinical Impression(s) / ED Diagnoses Final diagnoses:  Acute bilateral low back pain without sciatica  Pain in both lower extremities    Rx / DC Orders ED Discharge Orders     None         Anselmo Pickler, PA-C 10/23/22 0221    Fatima Blank, MD 10/24/22 [phone]

## 2022-10-23 NOTE — ED Notes (Signed)
Pt given a sandwich bag and ginger ale at this time.

## 2022-10-23 NOTE — ED Notes (Addendum)
Discharge instructions reviewed with pt. Discharge paperwork given to [facility] for transport. Pt verbalized understanding. Pt escorted out of ED via wheelchair with the Deputy.

## 2022-10-24 ENCOUNTER — Inpatient Hospital Stay (HOSPITAL_COMMUNITY)
Admission: EM | Admit: 2022-10-24 | Discharge: 2022-10-29 | DRG: 640 | Disposition: A | Discharge status: Home / Self Care | Attending: Internal Medicine | Admitting: Internal Medicine

## 2022-10-24 ENCOUNTER — Emergency Department (HOSPITAL_COMMUNITY)

## 2022-10-24 ENCOUNTER — Encounter (HOSPITAL_COMMUNITY): Payer: Self-pay

## 2022-10-24 DIAGNOSIS — L89152 Pressure ulcer of sacral region, stage 2: Secondary | ICD-10-CM | POA: Diagnosis present

## 2022-10-24 DIAGNOSIS — E8770 Fluid overload, unspecified: Secondary | ICD-10-CM | POA: Diagnosis not present

## 2022-10-24 DIAGNOSIS — Z992 Dependence on renal dialysis: Secondary | ICD-10-CM

## 2022-10-24 DIAGNOSIS — Z9049 Acquired absence of other specified parts of digestive tract: Secondary | ICD-10-CM

## 2022-10-24 DIAGNOSIS — I2489 Other forms of acute ischemic heart disease: Secondary | ICD-10-CM | POA: Diagnosis present

## 2022-10-24 DIAGNOSIS — N186 End stage renal disease: Secondary | ICD-10-CM | POA: Diagnosis not present

## 2022-10-24 DIAGNOSIS — I3139 Other pericardial effusion (noninflammatory): Secondary | ICD-10-CM | POA: Diagnosis present

## 2022-10-24 DIAGNOSIS — I132 Hypertensive heart and chronic kidney disease with heart failure and with stage 5 chronic kidney disease, or end stage renal disease: Secondary | ICD-10-CM | POA: Diagnosis present

## 2022-10-24 DIAGNOSIS — F603 Borderline personality disorder: Secondary | ICD-10-CM | POA: Diagnosis present

## 2022-10-24 DIAGNOSIS — F1910 Other psychoactive substance abuse, uncomplicated: Secondary | ICD-10-CM | POA: Diagnosis present

## 2022-10-24 DIAGNOSIS — Z91158 Patient's noncompliance with renal dialysis for other reason: Secondary | ICD-10-CM

## 2022-10-24 DIAGNOSIS — F17210 Nicotine dependence, cigarettes, uncomplicated: Secondary | ICD-10-CM | POA: Diagnosis present

## 2022-10-24 DIAGNOSIS — N289 Disorder of kidney and ureter, unspecified: Secondary | ICD-10-CM

## 2022-10-24 DIAGNOSIS — M545 Low back pain, unspecified: Secondary | ICD-10-CM

## 2022-10-24 DIAGNOSIS — Z79899 Other long term (current) drug therapy: Secondary | ICD-10-CM

## 2022-10-24 DIAGNOSIS — I160 Hypertensive urgency: Secondary | ICD-10-CM | POA: Diagnosis present

## 2022-10-24 DIAGNOSIS — F319 Bipolar disorder, unspecified: Secondary | ICD-10-CM | POA: Diagnosis present

## 2022-10-24 DIAGNOSIS — Z8249 Family history of ischemic heart disease and other diseases of the circulatory system: Secondary | ICD-10-CM

## 2022-10-24 DIAGNOSIS — R000 Tachycardia, unspecified: Secondary | ICD-10-CM | POA: Diagnosis present

## 2022-10-24 DIAGNOSIS — J810 Acute pulmonary edema: Secondary | ICD-10-CM | POA: Insufficient documentation

## 2022-10-24 DIAGNOSIS — I5043 Acute on chronic combined systolic (congestive) and diastolic (congestive) heart failure: Secondary | ICD-10-CM | POA: Diagnosis present

## 2022-10-24 DIAGNOSIS — Z91148 Patient's other noncompliance with medication regimen for other reason: Secondary | ICD-10-CM

## 2022-10-24 DIAGNOSIS — J9601 Acute respiratory failure with hypoxia: Secondary | ICD-10-CM | POA: Diagnosis present

## 2022-10-24 DIAGNOSIS — Z888 Allergy status to other drugs, medicaments and biological substances status: Secondary | ICD-10-CM

## 2022-10-24 DIAGNOSIS — G8929 Other chronic pain: Secondary | ICD-10-CM

## 2022-10-24 DIAGNOSIS — E875 Hyperkalemia: Secondary | ICD-10-CM | POA: Diagnosis present

## 2022-10-24 DIAGNOSIS — B9789 Other viral agents as the cause of diseases classified elsewhere: Secondary | ICD-10-CM | POA: Diagnosis present

## 2022-10-24 DIAGNOSIS — Z833 Family history of diabetes mellitus: Secondary | ICD-10-CM

## 2022-10-24 DIAGNOSIS — N2581 Secondary hyperparathyroidism of renal origin: Secondary | ICD-10-CM | POA: Diagnosis present

## 2022-10-24 DIAGNOSIS — F4310 Post-traumatic stress disorder, unspecified: Secondary | ICD-10-CM | POA: Diagnosis present

## 2022-10-24 DIAGNOSIS — D631 Anemia in chronic kidney disease: Secondary | ICD-10-CM | POA: Diagnosis present

## 2022-10-24 DIAGNOSIS — F1410 Cocaine abuse, uncomplicated: Secondary | ICD-10-CM | POA: Diagnosis present

## 2022-10-24 LAB — CBC WITH DIFFERENTIAL/PLATELET
Abs Immature Granulocytes: 0.03 10*3/uL (ref 0.00–0.07)
Basophils Absolute: 0 10*3/uL (ref 0.0–0.1)
Basophils Relative: 0 %
Eosinophils Absolute: 0.1 10*3/uL (ref 0.0–0.5)
Eosinophils Relative: 1 %
HCT: 29.8 % — ABNORMAL LOW (ref 36.0–46.0)
Hemoglobin: 9.4 g/dL — ABNORMAL LOW (ref 12.0–15.0)
Immature Granulocytes: 0 %
Lymphocytes Relative: 7 %
Lymphs Abs: 0.8 10*3/uL (ref 0.7–4.0)
MCH: 31.3 pg (ref 26.0–34.0)
MCHC: 31.5 g/dL (ref 30.0–36.0)
MCV: 99.3 fL (ref 80.0–100.0)
Monocytes Absolute: 0.8 10*3/uL (ref 0.1–1.0)
Monocytes Relative: 7 %
Neutro Abs: 9.1 10*3/uL — ABNORMAL HIGH (ref 1.7–7.7)
Neutrophils Relative %: 85 %
Platelets: 173 10*3/uL (ref 150–400)
RBC: 3 MIL/uL — ABNORMAL LOW (ref 3.87–5.11)
RDW: 21.2 % — ABNORMAL HIGH (ref 11.5–15.5)
WBC: 10.7 10*3/uL — ABNORMAL HIGH (ref 4.0–10.5)
nRBC: 0 % (ref 0.0–0.2)

## 2022-10-24 LAB — COMPREHENSIVE METABOLIC PANEL
ALT: 28 U/L (ref 0–44)
AST: 27 U/L (ref 15–41)
Albumin: 2.7 g/dL — ABNORMAL LOW (ref 3.5–5.0)
Alkaline Phosphatase: 54 U/L (ref 38–126)
Anion gap: 21 — ABNORMAL HIGH (ref 5–15)
BUN: 131 mg/dL — ABNORMAL HIGH (ref 6–20)
CO2: 20 mmol/L — ABNORMAL LOW (ref 22–32)
Calcium: 8.5 mg/dL — ABNORMAL LOW (ref 8.9–10.3)
Chloride: 98 mmol/L (ref 98–111)
Creatinine, Ser: 16.54 mg/dL — ABNORMAL HIGH (ref 0.44–1.00)
GFR, Estimated: 3 mL/min — ABNORMAL LOW (ref >60)
Glucose, Bld: 109 mg/dL — ABNORMAL HIGH (ref 70–99)
Potassium: 6.2 mmol/L — ABNORMAL HIGH (ref 3.5–5.1)
Sodium: 139 mmol/L (ref 135–145)
Total Bilirubin: 0.6 mg/dL (ref 0.3–1.2)
Total Protein: 6.9 g/dL (ref 6.5–8.1)

## 2022-10-24 LAB — BRAIN NATRIURETIC PEPTIDE: B Natriuretic Peptide: >4500 pg/mL — ABNORMAL HIGH (ref 0.0–100.0)

## 2022-10-24 LAB — TROPONIN I (HIGH SENSITIVITY)
Troponin I (High Sensitivity): 682 ng/L (ref <18)
Troponin I (High Sensitivity): 574 ng/L (ref <18)

## 2022-10-24 MED ORDER — CHLORHEXIDINE GLUCONATE CLOTH 2 % EX PADS: 6.0000 | MEDICATED_PAD | Freq: Every day | CUTANEOUS | Status: DC

## 2022-10-24 MED ORDER — LIDOCAINE 5 % EX PTCH
1.0000 | MEDICATED_PATCH | CUTANEOUS | Status: DC
  Administered 2022-10-24 – 2022-10-29 (×5): 1 via TRANSDERMAL
  Filled 2022-10-24 (×5): qty 1

## 2022-10-24 MED ORDER — HYDROCODONE-ACETAMINOPHEN 5-325 MG PO TABS
1.0000 | ORAL_TABLET | Freq: Once | ORAL | Status: AC
  Administered 2022-10-24: 1 via ORAL
  Filled 2022-10-24: qty 1

## 2022-10-24 NOTE — Progress Notes (Signed)
Brief nephrology note  HD patient [facility] currently in jail. Last dialysis Wednesday. Comes in with hyperkalemia and volume overload. Discussed case with nurse at jail. They said currently they don't have a plan for how they will dialyze this patient. I suggested they get one quickly. We will provide urgent dialysis tonight. If patient is stable on reevaluation in the ER afterwards she can DC.

## 2022-10-24 NOTE — ED Notes (Signed)
Pt eating a [place] sandwich at this time.

## 2022-10-24 NOTE — ED Triage Notes (Signed)
Pt BIB [facility] from jail d/t lower back & bil legs hurting, with SOB & edema. Missed her last dialysis (MWF) 200/120, 104 bpm, 98% on RA, CBG 126.

## 2022-10-24 NOTE — ED Provider Notes (Signed)
[facility] DEPART[facility] Provider Note  CSN: [id number] Arrival date & time: 10/24/22 1419  Chief Complaint(s) missed dialysis  HPI Natasha Long is a 26 y.o. female with PMH homelessness, PTSD, HTN, borderline personality disorder, ESRD on hemodialysis Monday Wednesday Friday, chronic back pain who presents emergency department for evaluation of back pain and missed dialysis.  Patient was seen yesterday for the exact same complaints.  She arrives screaming loudly at medical staff about her 10 out of 10 back pain.  Denies trauma, numbness, tingling, weakness, urinary or fecal incontinence or saddle anesthesia.  Patient states that this is a brand-new problem and is never happened before despite being seen yesterday for the same and is repeatedly asking for sandwich.  Last received dialysis Wednesday.   Past Medical History Past Medical History:  Diagnosis Date   Asthma    as a child, no problem as an adult, no inhaler   Complication of anesthesia    woke up before tube removed, 1 time fought nurses   ESRD on hemodialysis [facility])    M-W-F   History of borderline personality disorder    Hypertension    diagnosed as child; stopped meds at 58 yo   Insomnia    Neuromuscular disorder ([place])    peripheral neuropathy   Nonspecific chest pain    PTSD (post-traumatic stress disorder)    Patient Active Problem List   Diagnosis Date Noted   CHF (congestive heart failure) ([place]) 10/04/2022   Hyperkalemia, diminished renal excretion 28/78/6767   Toxic metabolic encephalopathy 20/94/7096   Back pain 08/31/2022   Homelessness 08/31/2022   Mitral valve insufficiency    Pleural effusion    Abnormal chest x-ray 08/24/2022   Heart failure with reduced ejection fraction ([place]) 08/24/2022   Acute on chronic systolic heart failure (HCC)    Volume overload 06/30/2022   Community acquired pneumonia 28/36/6294   Metabolic acidosis 76/54/6503   Acute kidney injury  superimposed on CKD ([place]) 06/26/2022   Uremia of renal origin 05/15/2022   High risk sexual behavior 05/15/2022   Polysubstance abuse ([place]) 05/15/2022   Lip lesion 05/06/2022   Vaginal itching 05/06/2022   Left ankle pain 05/06/2022   Contamination of blood culture 05/06/2022   Uremia    Pericardial effusion without cardiac tamponade    Altered mental status 05/03/2022   Syphilis    Pain management    Septic arthritis of multiple joints ([place]) 02/01/2022   Group G streptococcal infection 02/01/2022   Left wrist pain    Hyperkalemia 01/30/2022   Effusion of right knee    Arthritis, septic [facility])    Dialysis patient, noncompliant    Lower leg pain 01/29/2022   Hypokalemia 01/29/2022   Hyperphosphatemia 01/29/2022   Hypervolemia associated with renal insufficiency 12/23/2021   Elevated troponin 12/23/2021   ESRD on dialysis ([place]) 12/04/2021   Tobacco use 08/11/2021   Nipple discharge 08/11/2021   Breast pain 08/11/2021   Exposure to sexually transmitted disease (STD) 54/65/6812   Complication of vascular dialysis catheter 02/12/2021   Moderate protein-calorie malnutrition ([place]) 01/27/2021   Allergy, unspecified, initial encounter 01/23/2021   Anemia in chronic kidney disease 01/23/2021   End stage renal disease ([place]) 01/23/2021   Iron deficiency anemia, unspecified 01/23/2021   Other specified coagulation defects ([place]) 01/23/2021   Secondary hyperparathyroidism of renal origin ([place]) 01/23/2021   Anemia    Suicidal ideation    Influenza vaccine refused 12/19/2020   Morbid obesity ([place]) 01/30/2020   Focal glomerular sclerosis  01/30/2020   Cocaine use, 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[place]) 01/30/2020   Mood disorder ([place]) 06/27/2019   Borderline personality disorder ([place]) 11/16/2018   Moderate cannabis use disorder ([place]) 11/16/2018   Severe recurrent major depression without psychotic features ([place]) 11/15/2018   Chronic hypertension 04/04/2018   PTSD  (post-traumatic stress disorder) 04/01/2018   Home Medication(s) Prior to Admission medications   Medication Sig Start Date End Date Taking? Authorizing Provider  ARIPiprazole (ABILIFY) 5 MG tablet Take 1.5 tablets (7.5 mg total) by mouth daily. Patient taking differently: Take 10 mg by mouth daily. 09/02/22   Rai, Ripudeep K, MD  b complex-vitamin c-folic acid (NEPHRO-VITE) 0.8 MG TABS tablet Take 1 tablet by mouth daily. 09/02/22   Rai, Vernelle Emerald, MD  calcitRIOL (ROCALTROL) 0.25 MCG capsule Take 1 capsule (0.25 mcg total) by mouth Every Tuesday,Thursday,and Saturday with dialysis. Patient taking differently: Take 0.25 mcg by mouth every Monday, Wednesday, and Friday with hemodialysis. 09/03/22   Rai, Vernelle Emerald, MD  calcium acetate (PHOSLO) 667 MG capsule Take 1 capsule (667 mg total) by mouth 3 (three) times daily with meals. 09/02/22   Rai, Vernelle Emerald, MD  dicyclomine (BENTYL) 20 MG tablet Take 1 tablet (20 mg total) by mouth every 6 (six) hours as needed for spasms (abdominal cramping). 09/02/22   Rai, Vernelle Emerald, MD  gabapentin (NEURONTIN) 300 MG capsule Take 1 capsule (300 mg total) by mouth 2 (two) times daily. 09/02/22   Rai, Vernelle Emerald, MD  hydrOXYzine (VISTARIL) 25 MG capsule Take 1 capsule (25 mg total) by mouth every 8 (eight) hours as needed for anxiety. Patient taking differently: Take 50 mg by mouth every 8 (eight) hours as needed for anxiety. 09/02/22   Rai, Vernelle Emerald, MD  lidocaine-prilocaine (EMLA) cream Apply 1 application  topically daily as needed (port site access on dialysis days (Mondays, Wednesdays, Fridays). 03/06/21   [provider]  losartan (COZAAR) 50 MG tablet Take 1 tablet (50 mg total) by mouth daily. 09/02/22   Rai, Vernelle Emerald, MD  methocarbamol (ROBAXIN) 500 MG tablet Take 1 tablet (500 mg total) by mouth every 8 (eight) hours as needed for muscle spasms. 09/02/22   Rai, Vernelle Emerald, MD  sevelamer carbonate (RENVELA) 800 MG tablet Take 2 tablets (1,600 mg total)  by mouth 3 (three) times daily with meals. 09/02/22   Rai, Ripudeep Raliegh Ip, MD  torsemide (DEMADEX) 100 MG tablet Take 1 tablet (100 mg total) by mouth every morning. 09/02/22   Rai, Vernelle Emerald, MD  traZODone (DESYREL) 100 MG tablet Take 1 tablet (100 mg total) by mouth at bedtime. 09/02/22   Mendel Corning, MD                                                                                                                                    Past Surgical History Past Surgical History:  Procedure Laterality Date   AV FISTULA PLACEMENT  Left 10/18/2020   Procedure: LEFT ARM ARTERIOVENOUS (AV) FISTULA CREATION;  Surgeon: Serafina Mitchell, MD;  Location: [facility] OR;  Service: Vascular;  Laterality: Left;   CHOLECYSTECTOMY     extraction of wisdom teeth     FISTULA SUPERFICIALIZATION Left 02/13/2021   Procedure: LEFT BRACHIOCEPHALIC ARTERIOVENOUS FISTULA SUPERFICIALIZATION;  Surgeon: Serafina Mitchell, MD;  Location: [place];  Service: Vascular;  Laterality: Left;   I & D EXTREMITY Left 01/30/2022   Procedure: IRRIGATION AND DEBRIDEMENT EXTREMITY;  Surgeon: Milly Jakob, MD;  Location: [place];  Service: Orthopedics;  Laterality: Left;   INCISION AND DRAINAGE OF WOUND Left 01/30/2022   Procedure: LEFT WRIST ASPIRATION;  Surgeon: Vanetta Mulders, MD;  Location: [place];  Service: Orthopedics;  Laterality: Left;   KNEE ARTHROSCOPY Right 01/30/2022   Procedure: ARTHROSCOPY KNEE AND IRRIIGATION AND DEBRIDMENT; LEFT WRIST ASPIRATION;  Surgeon: Vanetta Mulders, MD;  Location: [place];  Service: Orthopedics;  Laterality: Right;   RENAL BIOPSY     x 2   TUNNELED VENOUS CATHETER PLACEMENT  02/11/2021   [facility]   Family History Family History  Adopted: Yes  Problem Relation Age of Onset   Diabetes Other    Hypertension Other     Social History Social History   Tobacco Use   Smoking status: Every Day    Packs/day: 0.50    Years: 10.00    Total pack years: 5.00    Types: Cigarettes   Smokeless tobacco: Never   Vaping Use   Vaping Use: Never used  Substance Use Topics   Alcohol use: Not Currently    Comment: "maybe 3 a month."- liquor   Drug use: Yes    Types: Marijuana, Cocaine    Comment: reports daily use of both   Allergies Bupropion, Prozac [fluoxetine hcl], and Prednisone  Review of Systems Review of Systems  Musculoskeletal:  Positive for back pain.    Physical Exam Vital Signs  I have reviewed the triage vital signs BP (!) 180/114   Pulse (!) 104   Resp (!) 21   LMP  (LMP Unknown) Comment: Patient notes she is not having periods (10/04/22)  SpO2 97%   Physical Exam Vitals and nursing note reviewed.  Constitutional:      General: She is not in acute distress.    Appearance: She is well-developed.  HENT:     Head: Normocephalic and atraumatic.  Eyes:     Conjunctiva/sclera: Conjunctivae normal.  Cardiovascular:     Rate and Rhythm: Normal rate and regular rhythm.     Heart sounds: No murmur heard. Pulmonary:     Effort: Pulmonary effort is normal. No respiratory distress.     Breath sounds: Normal breath sounds.  Abdominal:     Palpations: Abdomen is soft.     Tenderness: There is no abdominal tenderness.  Musculoskeletal:        General: Tenderness present. No swelling.     Cervical back: Neck supple.  Skin:    General: Skin is warm and dry.     Capillary Refill: Capillary refill takes less than 2 seconds.  Neurological:     Mental Status: She is alert.  Psychiatric:        Mood and Affect: Mood normal.     ED Results and Treatments Labs (all labs ordered are listed, but only abnormal results are displayed) Labs Reviewed  COMPREHENSIVE METABOLIC PANEL  CBC WITH DIFFERENTIAL/PLATELET  BRAIN NATRIURETIC PEPTIDE  TROPONIN I (HIGH SENSITIVITY)  Radiology No results found.  Pertinent labs & imaging results that were available during  my care of the patient were reviewed by me and considered in my medical decision making (see MDM for details).  Medications Ordered in ED Medications  lidocaine (LIDODERM) 5 % 1 patch (has no administration in time range)  HYDROcodone-acetaminophen (NORCO/VICODIN) 5-325 MG per tablet 1 tablet (has no administration in time range)                                                                                                                                     Procedures Procedures  (including critical care time)  Medical Decision Making / ED Course   This patient presents to the ED for concern of ***, this involves an extensive number of treatment options, and is a complaint that carries with it a high risk of complications and morbidity.  The differential diagnosis includes ***  MDM: ***   Additional history obtained: -Additional history obtained from *** -External records from outside source obtained and reviewed including: Chart review including previous notes, labs, imaging, consultation notes   Lab Tests: -I ordered, reviewed, and interpreted labs.   The pertinent results include:   Labs Reviewed  COMPREHENSIVE METABOLIC PANEL  CBC WITH DIFFERENTIAL/PLATELET  BRAIN NATRIURETIC PEPTIDE  TROPONIN I (HIGH SENSITIVITY)      EKG ***  EKG Interpretation  Date/Time:    Ventricular Rate:    PR Interval:    QRS Duration:   QT Interval:    QTC Calculation:   R Axis:     Text Interpretation:           Imaging Studies ordered: I ordered imaging studies including *** I independently visualized and interpreted imaging. I agree with the radiologist interpretation   Medicines ordered and prescription drug management: Meds ordered this encounter  Medications   lidocaine (LIDODERM) 5 % 1 patch   HYDROcodone-acetaminophen (NORCO/VICODIN) 5-325 MG per tablet 1 tablet    -I have reviewed the patients home medicines and have made adjustments as needed  Critical  interventions ***  Consultations Obtained: I requested consultation with the ***,  and discussed lab and imaging findings as well as pertinent plan - they recommend: ***   Cardiac Monitoring: The patient was maintained on a cardiac monitor.  I personally viewed and interpreted the cardiac monitored which showed an underlying rhythm of: ***  Social Determinants of Health:  Factors impacting patients care include: ***   Reevaluation: After the interventions noted above, I reevaluated the patient and found that they have :{resolved/improved/worsened:23923::"improved"}  Co morbidities that complicate the patient evaluation  Past Medical History:  Diagnosis Date   Asthma    as a child, no problem as an adult, no inhaler   Complication of anesthesia    woke up before tube removed, 1 time fought nurses   ESRD on hemodialysis [facility])    M-W-F   History of borderline  personality disorder    Hypertension    diagnosed as child; stopped meds at 7 yo   Insomnia    Neuromuscular disorder ([place])    peripheral neuropathy   Nonspecific chest pain    PTSD (post-traumatic stress disorder)       Dispostion: I considered admission for this patient, ***     Final Clinical Impression(s) / ED Diagnoses Final diagnoses:  None     @PCDICTATION @

## 2022-10-24 NOTE — ED Notes (Signed)
Patient transported to CT 

## 2022-10-25 ENCOUNTER — Emergency Department (HOSPITAL_COMMUNITY)

## 2022-10-25 ENCOUNTER — Inpatient Hospital Stay (HOSPITAL_COMMUNITY)

## 2022-10-25 ENCOUNTER — Other Ambulatory Visit (HOSPITAL_COMMUNITY): Payer: Medicaid Other

## 2022-10-25 DIAGNOSIS — E8770 Fluid overload, unspecified: Secondary | ICD-10-CM | POA: Diagnosis present

## 2022-10-25 DIAGNOSIS — I2489 Other forms of acute ischemic heart disease: Secondary | ICD-10-CM | POA: Diagnosis present

## 2022-10-25 DIAGNOSIS — Z992 Dependence on renal dialysis: Secondary | ICD-10-CM | POA: Diagnosis not present

## 2022-10-25 DIAGNOSIS — I160 Hypertensive urgency: Secondary | ICD-10-CM | POA: Diagnosis present

## 2022-10-25 DIAGNOSIS — N2581 Secondary hyperparathyroidism of renal origin: Secondary | ICD-10-CM | POA: Diagnosis present

## 2022-10-25 DIAGNOSIS — E875 Hyperkalemia: Secondary | ICD-10-CM

## 2022-10-25 DIAGNOSIS — I3139 Other pericardial effusion (noninflammatory): Secondary | ICD-10-CM | POA: Diagnosis present

## 2022-10-25 DIAGNOSIS — F1910 Other psychoactive substance abuse, uncomplicated: Secondary | ICD-10-CM | POA: Diagnosis not present

## 2022-10-25 DIAGNOSIS — I5043 Acute on chronic combined systolic (congestive) and diastolic (congestive) heart failure: Secondary | ICD-10-CM | POA: Diagnosis present

## 2022-10-25 DIAGNOSIS — I5033 Acute on chronic diastolic (congestive) heart failure: Secondary | ICD-10-CM

## 2022-10-25 DIAGNOSIS — N186 End stage renal disease: Secondary | ICD-10-CM | POA: Diagnosis present

## 2022-10-25 DIAGNOSIS — Z79899 Other long term (current) drug therapy: Secondary | ICD-10-CM | POA: Diagnosis not present

## 2022-10-25 DIAGNOSIS — F603 Borderline personality disorder: Secondary | ICD-10-CM | POA: Diagnosis present

## 2022-10-25 DIAGNOSIS — F17210 Nicotine dependence, cigarettes, uncomplicated: Secondary | ICD-10-CM | POA: Diagnosis present

## 2022-10-25 DIAGNOSIS — M79602 Pain in left arm: Secondary | ICD-10-CM | POA: Diagnosis not present

## 2022-10-25 DIAGNOSIS — L89152 Pressure ulcer of sacral region, stage 2: Secondary | ICD-10-CM | POA: Diagnosis present

## 2022-10-25 DIAGNOSIS — F1410 Cocaine abuse, uncomplicated: Secondary | ICD-10-CM | POA: Diagnosis present

## 2022-10-25 DIAGNOSIS — Z888 Allergy status to other drugs, medicaments and biological substances status: Secondary | ICD-10-CM | POA: Diagnosis not present

## 2022-10-25 DIAGNOSIS — D631 Anemia in chronic kidney disease: Secondary | ICD-10-CM | POA: Diagnosis present

## 2022-10-25 DIAGNOSIS — B9789 Other viral agents as the cause of diseases classified elsewhere: Secondary | ICD-10-CM | POA: Diagnosis present

## 2022-10-25 DIAGNOSIS — J9601 Acute respiratory failure with hypoxia: Secondary | ICD-10-CM | POA: Diagnosis present

## 2022-10-25 DIAGNOSIS — Z9049 Acquired absence of other specified parts of digestive tract: Secondary | ICD-10-CM | POA: Diagnosis not present

## 2022-10-25 DIAGNOSIS — F4310 Post-traumatic stress disorder, unspecified: Secondary | ICD-10-CM | POA: Diagnosis present

## 2022-10-25 DIAGNOSIS — F319 Bipolar disorder, unspecified: Secondary | ICD-10-CM | POA: Diagnosis present

## 2022-10-25 DIAGNOSIS — I132 Hypertensive heart and chronic kidney disease with heart failure and with stage 5 chronic kidney disease, or end stage renal disease: Secondary | ICD-10-CM | POA: Diagnosis present

## 2022-10-25 DIAGNOSIS — J810 Acute pulmonary edema: Secondary | ICD-10-CM | POA: Diagnosis not present

## 2022-10-25 LAB — RESPIRATORY PANEL BY PCR

## 2022-10-25 LAB — ECHOCARDIOGRAM COMPLETE
AR max vel: 1.93 cm2
AV Area VTI: 1.96 cm2
AV Area mean vel: 1.78 cm2
AV Mean grad: 26 mmHg
AV Peak grad: 47.1 mmHg
Ao pk vel: 3.43 m/s
Area-P 1/2: 2.51 cm2
MV M vel: 5.54 m/s
MV Peak grad: 122.8 mmHg
S' Lateral: 4.4 cm

## 2022-10-25 LAB — IRON AND TIBC
Iron: 13 ug/dL — ABNORMAL LOW (ref 28–170)
Saturation Ratios: 6 % — ABNORMAL LOW (ref 10.4–31.8)
TIBC: 217 ug/dL — ABNORMAL LOW (ref 250–450)
UIBC: 204 ug/dL

## 2022-10-25 LAB — I-STAT ARTERIAL BLOOD GAS, ED
Acid-Base Excess: 1 mmol/L (ref 0.0–2.0)
Bicarbonate: 25.2 mmol/L (ref 20.0–28.0)
Calcium, Ion: 1.11 mmol/L — ABNORMAL LOW (ref 1.15–1.40)
HCT: 26 % — ABNORMAL LOW (ref 36.0–46.0)
Hemoglobin: 8.8 g/dL — ABNORMAL LOW (ref 12.0–15.0)
O2 Saturation: 92 %
Patient temperature: 99.8
Potassium: 4.9 mmol/L (ref 3.5–5.1)
Sodium: 134 mmol/L — ABNORMAL LOW (ref 135–145)
TCO2: 26 mmol/L (ref 22–32)
pCO2 arterial: 40.7 mmHg (ref 32–48)
pH, Arterial: 7.402 (ref 7.35–7.45)
pO2, Arterial: 66 mmHg — ABNORMAL LOW (ref 83–108)

## 2022-10-25 LAB — PROCALCITONIN: Procalcitonin: 2.02 ng/mL

## 2022-10-25 LAB — TROPONIN I (HIGH SENSITIVITY)
Troponin I (High Sensitivity): 646 ng/L (ref <18)
Troponin I (High Sensitivity): 626 ng/L (ref <18)

## 2022-10-25 LAB — FERRITIN: Ferritin: 199 ng/mL (ref 11–307)

## 2022-10-25 LAB — SEDIMENTATION RATE: Sed Rate: 40 mm/hr — ABNORMAL HIGH (ref 0–22)

## 2022-10-25 LAB — LACTIC ACID, PLASMA
Lactic Acid, Venous: 0.4 mmol/L — ABNORMAL LOW (ref 0.5–1.9)
Lactic Acid, Venous: 0.8 mmol/L (ref 0.5–1.9)

## 2022-10-25 MED ORDER — NITROGLYCERIN 2 % TD OINT: 1.0000 [in_us] | TOPICAL_OINTMENT | Freq: Four times a day (QID) | TRANSDERMAL | Status: DC | PRN

## 2022-10-25 MED ORDER — FENTANYL CITRATE PF 50 MCG/ML IJ SOSY
12.5000 ug | PREFILLED_SYRINGE | INTRAMUSCULAR | Status: DC | PRN
  Administered 2022-10-25 (×5): 12.5 ug via INTRAVENOUS
  Filled 2022-10-25 (×6): qty 1

## 2022-10-25 MED ORDER — HYDRALAZINE HCL 20 MG/ML IJ SOLN
10.0000 mg | Freq: Four times a day (QID) | INTRAMUSCULAR | Status: DC | PRN
  Filled 2022-10-25: qty 1

## 2022-10-25 MED ORDER — ACETAMINOPHEN 325 MG PO TABS
650.0000 mg | ORAL_TABLET | Freq: Four times a day (QID) | ORAL | Status: DC | PRN
  Administered 2022-10-25 – 2022-10-27 (×3): 650 mg via ORAL
  Filled 2022-10-25 (×3): qty 2

## 2022-10-25 MED ORDER — FUROSEMIDE 10 MG/ML IJ SOLN
120.0000 mg | Freq: Two times a day (BID) | INTRAVENOUS | Status: AC
  Administered 2022-10-25 (×2): 120 mg via INTRAVENOUS
  Filled 2022-10-25 (×2): qty 10

## 2022-10-25 MED ORDER — DARBEPOETIN ALFA 200 MCG/0.4ML IJ SOSY
200.0000 ug | PREFILLED_SYRINGE | INTRAMUSCULAR | Status: DC
  Administered 2022-10-26: 200 ug via SUBCUTANEOUS
  Filled 2022-10-25: qty 0.4

## 2022-10-25 MED ORDER — ACETAMINOPHEN 650 MG RE SUPP: 650.0000 mg | Freq: Four times a day (QID) | RECTAL | Status: DC | PRN

## 2022-10-25 MED ORDER — ACETAMINOPHEN 325 MG PO TABS: 650.0000 mg | ORAL_TABLET | Freq: Four times a day (QID) | ORAL | Status: DC | PRN

## 2022-10-25 MED ORDER — IOHEXOL 350 MG/ML SOLN
75.0000 mL | Freq: Once | INTRAVENOUS | Status: AC | PRN
  Administered 2022-10-25: 75 mL via INTRAVENOUS

## 2022-10-25 MED ORDER — FENTANYL CITRATE PF 50 MCG/ML IJ SOSY
50.0000 ug | PREFILLED_SYRINGE | Freq: Once | INTRAMUSCULAR | Status: AC
  Administered 2022-10-25: 50 ug via INTRAVENOUS
  Filled 2022-10-25: qty 1

## 2022-10-25 MED ORDER — DILTIAZEM HCL 30 MG PO TABS
30.0000 mg | ORAL_TABLET | Freq: Three times a day (TID) | ORAL | Status: AC
  Administered 2022-10-25 – 2022-10-26 (×3): 30 mg via ORAL
  Filled 2022-10-25 (×3): qty 1

## 2022-10-25 MED ORDER — SALINE SPRAY 0.65 % NA SOLN
1.0000 | Freq: Once | NASAL | Status: DC
  Filled 2022-10-25 (×2): qty 44

## 2022-10-25 MED ORDER — VANCOMYCIN HCL 2000 MG/400ML IV SOLN
2000.0000 mg | Freq: Once | INTRAVENOUS | Status: AC
  Administered 2022-10-25: 2000 mg via INTRAVENOUS
  Filled 2022-10-25: qty 400

## 2022-10-25 MED ORDER — CALCIUM ACETATE (PHOS BINDER) 667 MG PO CAPS
667.0000 mg | ORAL_CAPSULE | Freq: Three times a day (TID) | ORAL | Status: DC
  Administered 2022-10-25 – 2022-10-29 (×11): 667 mg via ORAL
  Filled 2022-10-25 (×12): qty 1

## 2022-10-25 MED ORDER — ALBUTEROL SULFATE (2.5 MG/3ML) 0.083% IN NEBU: 2.5000 mg | INHALATION_SOLUTION | RESPIRATORY_TRACT | Status: DC | PRN

## 2022-10-25 MED ORDER — FUROSEMIDE 10 MG/ML IJ SOLN
20.0000 mg | Freq: Once | INTRAMUSCULAR | Status: AC
  Administered 2022-10-25: 20 mg via INTRAVENOUS
  Filled 2022-10-25: qty 2

## 2022-10-25 MED ORDER — CALCITRIOL 0.25 MCG PO CAPS
0.2500 ug | ORAL_CAPSULE | Freq: Every day | ORAL | Status: DC
  Administered 2022-10-25 – 2022-10-29 (×5): 0.25 ug via ORAL
  Filled 2022-10-25 (×5): qty 1

## 2022-10-25 MED ORDER — NALOXONE HCL 0.4 MG/ML IJ SOLN: 0.4000 mg | INTRAMUSCULAR | Status: DC | PRN

## 2022-10-25 MED ORDER — HEPARIN SODIUM (PORCINE) 5000 UNIT/ML IJ SOLN
5000.0000 [IU] | Freq: Three times a day (TID) | INTRAMUSCULAR | Status: DC
  Administered 2022-10-25 – 2022-10-27 (×5): 5000 [IU] via SUBCUTANEOUS
  Filled 2022-10-25 (×7): qty 1

## 2022-10-25 MED ORDER — FENTANYL CITRATE PF 50 MCG/ML IJ SOSY: 12.5000 ug | PREFILLED_SYRINGE | INTRAMUSCULAR | Status: DC | PRN

## 2022-10-25 NOTE — ED Notes (Signed)
Meal given. Sheets adjusted. Pain meds given for generalized pain. Placed on venturimask because she refuses [place] while sleeping-O2 89% while sleeping. Venturi set to 4LPM.

## 2022-10-25 NOTE — Progress Notes (Addendum)
Patient is admitted early this am, details please see HPI, she is seen and examined, RN and prison staff at bedside.    Briefly ESRD patient noncompliance with HD , frequent hospitalization and leaving AMA, brought in from jail due to uncontrolled Hypertension, chest pain, CTA no PE, no dissection, +pleural effusion ( know h/o pleural effusion),+ pericardioeffusion ( known h/o pericardioeffusion),  case discussed with cardiology Dr Domenic Polite who recommend getting echo cardiogram,  per DR Mcdowell no need formal cardiology consult if no tamponade on echo. Nephrology consulted for dialysis, oked with iv lasix 120mg  bidx2 doses , order placed .   She is currently on room air, slightly sinus tachycardia, bp elevated, can not use betablocker due to h/o cocaine use, will start cardizem x 3 doses , expect bp /hr will improve with dialysis, f/u on echocardiogram.

## 2022-10-25 NOTE — ED Notes (Signed)
Patient ambulated with pulse ox. Patient doubled over forward in pain. Patient complaining of 10/10 back pain. Patient 02 dropped to 86 while ambulating. MD Wickline made aware.

## 2022-10-25 NOTE — H&P (Signed)
History and Physical    Natasha Long VFI:[id number] DOB: [birth year] DOA: 10/24/2022  PCP: [name], MD  [facility]  I have personally briefly reviewed patient's old medical records in [place]  Chief Complaint: sob/back pain /palpitations   HPI: Natasha Long is a 27 y.o. female with medical history significant of   chronic HFrEF with LVEF of 40-45%, severe MR, ESRD on HD TTS, HTN, PTSD, who presents to ED BIB EMS with complaint of back / b/l leg pain ,pleuritic chest , sob and cough productive of yellow sputum. Per patient she has had symptoms x 1 week which has been progressive. She also note associated chills but no fever n/v/abdominal pain or diarrhea.  She notes compliance with all her medications.  ED Course:  In ed on arrival patient noted to be hypertensive , with fluid overload, as well as hyperkalemic. Patient was sent to HD as fluid overload thought to be cause of her presenting sxs.  Patient however s/p HD did not have improvement in her symptoms and was then slated for admission for further evaluation.   Vitals: Tmx 99, bp 170/112, hr 97, rr 20 sat 100% on ra   EKG:  Nsr, LAD  low voltage   Cxr:  Constellation of findings favored to represent worsening CHF.   Increased moderate right and similar small left pleural effusions.   Labs: Wbc 10.7, hgb 9.4 (8.8) pmn 9.1 plt 173 CE 682 , 574 Na 139, K6.2, cl 98, bicarb 20, cr 16.54,  aG 21  Bnp >4500 Tx cozaar , robaxin, oxycodone,lidodern  Review of Systems: As per HPI otherwise 10 point review of systems negative.   Past Medical History:  Diagnosis Date   Asthma    as a child, no problem as an adult, no inhaler   Complication of anesthesia    woke up before tube removed, 1 time fought nurses   ESRD on hemodialysis [facility])    M-W-F   History of borderline personality disorder    Hypertension    diagnosed as child; stopped meds at 72 yo   Insomnia    Neuromuscular  disorder ([place])    peripheral neuropathy   Nonspecific chest pain    PTSD (post-traumatic stress disorder)     Past Surgical History:  Procedure Laterality Date   AV FISTULA PLACEMENT Left 10/18/2020   Procedure: LEFT ARM ARTERIOVENOUS (AV) FISTULA CREATION;  Surgeon: Serafina Mitchell, MD;  Location: [facility] OR;  Service: Vascular;  Laterality: Left;   CHOLECYSTECTOMY     extraction of wisdom teeth     FISTULA SUPERFICIALIZATION Left 02/13/2021   Procedure: LEFT BRACHIOCEPHALIC ARTERIOVENOUS FISTULA SUPERFICIALIZATION;  Surgeon: Serafina Mitchell, MD;  Location: [facility];  Service: Vascular;  Laterality: Left;   I & D EXTREMITY Left 01/30/2022   Procedure: IRRIGATION AND DEBRIDEMENT EXTREMITY;  Surgeon: Milly Jakob, MD;  Location: [facility];  Service: Orthopedics;  Laterality: Left;   INCISION AND DRAINAGE OF WOUND Left 01/30/2022   Procedure: LEFT WRIST ASPIRATION;  Surgeon: Vanetta Mulders, MD;  Location: [facility];  Service: Orthopedics;  Laterality: Left;   KNEE ARTHROSCOPY Right 01/30/2022   Procedure: ARTHROSCOPY KNEE AND IRRIIGATION AND DEBRIDMENT; LEFT WRIST ASPIRATION;  Surgeon: Vanetta Mulders, MD;  Location: [facility];  Service: Orthopedics;  Laterality: Right;   RENAL BIOPSY     x 2   TUNNELED VENOUS CATHETER PLACEMENT  02/11/2021   [facility]     reports that she has been smoking cigarettes.  She has a 5.00 pack-year smoking history. She has never used smokeless tobacco. She reports that she does not currently use alcohol. She reports current drug use. Drugs: Marijuana and Cocaine.  Allergies  Allergen Reactions   Bupropion Anxiety, Other (See Comments) and Nausea And Vomiting    Caused panic attacks  Caused panic attacks    "Adverse effects"   Prozac [Fluoxetine Hcl] Anxiety and Other (See Comments)    Caused panic attacks   Prednisone Other (See Comments)    Pt stated this med caused pancreatitis    Family History  Adopted: Yes  Problem Relation Age of Onset   Diabetes Other     Hypertension Other     Prior to Admission medications   Medication Sig Start Date End Date Taking? Authorizing Provider  ARIPiprazole (ABILIFY) 5 MG tablet Take 1.5 tablets (7.5 mg total) by mouth daily. Patient taking differently: Take 10 mg by mouth daily. 09/02/22   Rai, Ripudeep K, MD  b complex-vitamin c-folic acid (NEPHRO-VITE) 0.8 MG TABS tablet Take 1 tablet by mouth daily. 09/02/22   Rai, Vernelle Emerald, MD  calcitRIOL (ROCALTROL) 0.25 MCG capsule Take 1 capsule (0.25 mcg total) by mouth Every Tuesday,Thursday,and Saturday with dialysis. Patient taking differently: Take 0.25 mcg by mouth every Monday, Wednesday, and Friday with hemodialysis. 09/03/22   Rai, Vernelle Emerald, MD  calcium acetate (PHOSLO) 667 MG capsule Take 1 capsule (667 mg total) by mouth 3 (three) times daily with meals. 09/02/22   Rai, Vernelle Emerald, MD  dicyclomine (BENTYL) 20 MG tablet Take 1 tablet (20 mg total) by mouth every 6 (six) hours as needed for spasms (abdominal cramping). 09/02/22   Rai, Vernelle Emerald, MD  gabapentin (NEURONTIN) 300 MG capsule Take 1 capsule (300 mg total) by mouth 2 (two) times daily. 09/02/22   Rai, Vernelle Emerald, MD  hydrOXYzine (VISTARIL) 25 MG capsule Take 1 capsule (25 mg total) by mouth every 8 (eight) hours as needed for anxiety. Patient taking differently: Take 50 mg by mouth every 8 (eight) hours as needed for anxiety. 09/02/22   Rai, Vernelle Emerald, MD  lidocaine-prilocaine (EMLA) cream Apply 1 application  topically daily as needed (port site access on dialysis days (Mondays, Wednesdays, Fridays). 03/06/21   [provider]  losartan (COZAAR) 50 MG tablet Take 1 tablet (50 mg total) by mouth daily. 09/02/22   Rai, Vernelle Emerald, MD  methocarbamol (ROBAXIN) 500 MG tablet Take 1 tablet (500 mg total) by mouth every 8 (eight) hours as needed for muscle spasms. 09/02/22   Rai, Vernelle Emerald, MD  sevelamer carbonate (RENVELA) 800 MG tablet Take 2 tablets (1,600 mg total) by mouth 3 (three) times daily with  meals. 09/02/22   Rai, Ripudeep Raliegh Ip, MD  torsemide (DEMADEX) 100 MG tablet Take 1 tablet (100 mg total) by mouth every morning. 09/02/22   Rai, Vernelle Emerald, MD  traZODone (DESYREL) 100 MG tablet Take 1 tablet (100 mg total) by mouth at bedtime. 09/02/22   Mendel Corning, MD    Physical Exam: Vitals:   10/25/22 0245 10/25/22 0300 10/25/22 0330 10/25/22 0345  BP: (!) 172/119 (!) 180/153 (!) 158/114 (!) 172/118  Pulse: (!) 113 (!) 110 (!) 111 (!) 113  Resp: (!) 24 (!) 22 (!) 31 (!) 22  Temp:      TempSrc:      SpO2: 92% 93% 98% 97%    Constitutional: NAD, calm, comfortable Vitals:   10/25/22 0245 10/25/22 0300 10/25/22 0330 10/25/22 0345  BP: (!) 172/119 Marland Kitchen)  180/153 (!) 158/114 (!) 172/118  Pulse: (!) 113 (!) 110 (!) 111 (!) 113  Resp: (!) 24 (!) 22 (!) 31 (!) 22  Temp:      TempSrc:      SpO2: 92% 93% 98% 97%   Eyes: PERRL, lids and conjunctivae normal ENMT: Mucous membranes  moist. Posterior pharynx clear of any exudate or lesions.Normal dentition.  Neck: normal, supple, no masses, no thyromegaly Respiratory: clear to auscultation bilaterally, no wheezing, no crackles. Normal respiratory effort. No accessory muscle use.  Cardiovascular: Regular rate and rhythm, faint murmurs / rubs / gallops. + extremity edema. 2+ pedal pulses.  Abdomen: no tenderness, no masses palpated. No hepatosplenomegaly. Bowel sounds positive.  Musculoskeletal: no clubbing / cyanosis. No joint deformity upper and lower extremities. Good ROM, no contractures. Normal muscle tone.  Skin: no rashes, lesions, ulcers. No induration Neurologic: CN 2-12 grossly intact. Sensation intact, MAE Psychiatric: Normal judgment and insight. Alert and oriented x 3.    Labs on Admission: I have personally reviewed following labs and imaging studies  CBC: Recent Labs  Lab 10/21/22 0914 10/24/22 1513  WBC 8.6 10.7*  NEUTROABS 6.5 9.1*  HGB 9.7* 9.4*  HCT 30.8* 29.8*  MCV 100.3* 99.3  PLT 238 588   Basic Metabolic  Panel: Recent Labs  Lab 10/21/22 0914 10/24/22 1513  NA 141 139  K 6.7* 6.2*  CL 102 98  CO2 16* 20*  GLUCOSE 112* 109*  BUN 155* 131*  CREATININE 19.06* 16.54*  CALCIUM 9.3 8.5*   GFR: Estimated Creatinine Clearance: 6 mL/min (A) (by C-G formula based on SCr of 16.54 mg/dL (H)). Liver Function Tests: Recent Labs  Lab 10/21/22 0914 10/24/22 1513  AST 62* 27  ALT 43 28  ALKPHOS 77 54  BILITOT 0.6 0.6  PROT 7.4 6.9  ALBUMIN 3.4* 2.7*   No results for input(s): "LIPASE", "AMYLASE" in the last 168 hours. No results for input(s): "AMMONIA" in the last 168 hours. Coagulation Profile: No results for input(s): "INR", "PROTIME" in the last 168 hours. Cardiac Enzymes: No results for input(s): "CKTOTAL", "CKMB", "CKMBINDEX", "TROPONINI" in the last 168 hours. BNP (last 3 results) No results for input(s): "PROBNP" in the last 8760 hours. HbA1C: No results for input(s): "HGBA1C" in the last 72 hours. CBG: No results for input(s): "GLUCAP" in the last 168 hours. Lipid Profile: No results for input(s): "CHOL", "HDL", "LDLCALC", "TRIG", "CHOLHDL", "LDLDIRECT" in the last 72 hours. Thyroid Function Tests: No results for input(s): "TSH", "T4TOTAL", "FREET4", "T3FREE", "THYROIDAB" in the last 72 hours. Anemia Panel: No results for input(s): "VITAMINB12", "FOLATE", "FERRITIN", "TIBC", "IRON", "RETICCTPCT" in the last 72 hours. Urine analysis:    Component Value Date/Time   COLORURINE YELLOW 06/02/2021 1221   APPEARANCEUR CLOUDY (A) 06/02/2021 1221   LABSPEC 1.010 06/02/2021 1221   PHURINE 7.0 06/02/2021 1221   GLUCOSEU 50 (A) 06/02/2021 1221   HGBUR NEGATIVE 06/02/2021 [place] 06/02/2021 1221   BILIRUBINUR negative 01/30/2020 1526   BILIRUBINUR neg 09/15/2017 1657   KETONESUR NEGATIVE 06/02/2021 1221   PROTEINUR 100 (A) 06/02/2021 1221   UROBILINOGEN 0.2 01/30/2020 1526   NITRITE NEGATIVE 06/02/2021 1221   LEUKOCYTESUR NEGATIVE 06/02/2021 1221     Radiological Exams on Admission: DG Chest Port 1 View  Result Date: 10/25/2022 CLINICAL DATA:  Shortness of breath. EXAM: PORTABLE CHEST 1 VIEW COMPARISON:  October 24, 2022 FINDINGS: The cardiac silhouette is moderately enlarged and unchanged in size. Stable, moderate severity atelectasis and/or infiltrate is seen within  the bilateral lung bases. There is stable prominence of the perihilar pulmonary vasculature. A small left pleural effusion is seen. A moderate sized right pleural effusion is also noted. This is decreased in size when compared to the prior study. No pneumothorax is identified. The visualized skeletal structures are unremarkable. IMPRESSION: 1. Stable cardiomegaly and pulmonary vascular congestion. 2. Stable, moderate severity bibasilar atelectasis and/or infiltrate. 3. Moderate sized right-sided pleural effusion, decreased in size when compared to the prior exam. Electronically Signed   By: Virgina Norfolk M.D.   On: 10/25/2022 03:03   CT Lumbar Spine Wo Contrast  Result Date: 10/24/2022 CLINICAL DATA:  Trauma. Back pain. EXAM: CT LUMBAR SPINE WITHOUT CONTRAST TECHNIQUE: Multidetector CT imaging of the lumbar spine was performed without intravenous contrast administration. Multiplanar CT image reconstructions were also generated. RADIATION DOSE REDUCTION: This exam was performed according to the departmental dose-optimization program which includes automated exposure control, adjustment of the mA and/or kV according to patient size and/or use of iterative reconstruction technique. COMPARISON:  CT scan 08/31/2022 FINDINGS: Segmentation: There are five lumbar type vertebral bodies. The last full intervertebral disc space is labeled L5-S1. Alignment: Normal Vertebrae: No acute fracture. Paraspinal and other soft tissues: No significant paraspinal findings. Stable SI joint degenerative changes. Age advanced vascular calcifications. Moderate free pelvic fluid. Severe diffuse body wall  edema. Disc levels: Stable advanced degenerative disc disease at L5-S1 with bulging annulus and osteophytic ridging but no significant spinal or foraminal stenosis in the lumbar spine. IMPRESSION: 1. Normal alignment of the lumbar vertebral bodies and no acute fracture. 2. Stable advanced degenerative disc disease at L5-S1 with bulging annulus and osteophytic ridging but no significant spinal or foraminal stenosis. 3. Age advanced vascular calcifications. 4. Moderate free pelvic fluid and severe diffuse body wall edema. Electronically Signed   By: Marijo Sanes M.D.   On: 10/24/2022 18:51   DG Chest Portable 1 View  Result Date: 10/24/2022 CLINICAL DATA:  Dyspnea after missing dialysis EXAM: PORTABLE CHEST 1 VIEW COMPARISON:  Radiograph 10/04/2022 FINDINGS: Interval increase in the now moderate right pleural effusion with similar small left pleural effusion. Similar cardiomegaly. Pulmonary vascular congestion has increased since 10/04/2022 with lower lung infiltrates which may represent edema or infection. No acute osseous abnormality. IMPRESSION: Constellation of findings favored to represent worsening CHF. Increased moderate right and similar small left pleural effusions. Electronically Signed   By: Placido Sou M.D.   On: 10/24/2022 16:25    EKG: Independently reviewed. See above   Assessment/Plan  Hypertensive Urgency  -in patient with CHF/ESRD -s/p HD in ED to assist with management of blood pressure and fluid status  -start nitro paste  -hydralazine prn  -in no improvement may need to consider  nitro drip  -admit to progressive   Acute on chronic  CHFref chronic HFrEF with LVEF of 40-45% Severe MR -fluid balance managed with HD  -continue GDMT as able  -strict I/o /daily weight  -echo in am to evaluate valve status /as well as cardiac function   Persistent Back pain / tachycardia / tachypnea  - CTPA pending for further evaluation  -possible related to pe/ occult pna / htn  urgency   Elevated CE -in setting of back pain / uncontrolled htn CHF/chronic trop leak  -note trop trending down , presumed due to demand  - will f/u with echo to be complete  -cycle ce -ekg without hyperacute st -twave changes  -consider cardiology consult in am if ce trend up or abnormal echo  ESRD on HD TTS -noted fluid overload/ hyperkalemia -post HD labs pending -renal consult in am to assist with bp control and fluid balance    Progressive right pleural effusion  -consider thoracentesis if patient remains sob    PTSD/ Bipolar d/o  -resume psych regimen  DVT prophylaxis: heparin Code Status: full Family Communication: none at bedsdie Disposition Plan: patient  expected to be admitted greater than 2 midnights  Consults called:nephrology , consider cardiology,  Admission status: progressive care    Clance Boll MD [facility]  If 7PM-7AM, please contact night-coverage [email or web address] Password [facility]  10/25/2022, 4:56 AM

## 2022-10-25 NOTE — Consult Note (Addendum)
ESRD Consult Note  Requesting provider: Florencia Reasons Service requesting consult: Hospitalist Reason for consult: ESRD, provision of dialysis Indication for acute dialysis?: End Stage Renal Disease  Outpatient dialysis [facility]: [facility] Out[facility] dialysis prescription: MWF, F180, BFR 400, edw 92kg, 2K, 2Ca, LUE AVF, Mircera 265mcg q 2 weeks  Assessment/Recommendations: Natasha Long is a/an 26 y.o. female with a past medical history notable for ESRD on HD admitted with volume overload   # ESRD: Got HD on Saturday, plan for HD again tomorrow for volume removal. May need to touch base with jail to see if they have an HD plan for her.  # Volume/ hypertension: EDW 92kg but likely lower than that. Poor compliance and volume overloaded at this time. Attempt to achieve EDW as tolerated with HD. Can give lasix 120mg  IV x 2 doses today  # Anemia of Chronic Kidney Disease: Hemoglobin 8.8. Plan for aranesp w/ HD tomorrow. Obtain iron studies  # Secondary Hyperparathyroidism/Hyperphosphatemia: Phos 7.5. Continue home phoslo and calcitriol  # Vascular access: LUE AVF with no issues  # Chest pain/NSTEMI: likely related to demand. Volume removal with HD as tolerated.  # Additional recommendations: - Dose all meds for creatinine clearance < 10 ml/min  - Unless absolutely necessary, no MRIs with gadolinium.  - Implement save arm precautions.  Prefer needle sticks in the dorsum of the hands or wrists.  No blood pressure measurements in arm. - If blood transfusion is requested during hemodialysis sessions, please alert [place] prior to the session.  - Use synthetic opioids (Fentanyl/Dilaudid) if needed  Recommendations were discussed with the primary team.   History of Present Illness: Natasha Long is a/an 26 y.o. female with a past medical history of ESRD who presents with volume overload and chest pain.  Patient presented to the hospital yesterday with leg pain, pleuritic chest pain, shortness of  breath, cough for the past week.  [facility] is currently in jail and last received dialysis on Wednesday.  Patient also has had some chills but denies any fevers, nausea, vomiting, diarrhea.  In the emergency department she was noted to be hypertensive and volume overloaded.  We provided dialysis on 10/24/2022 with 3.5 L removed.  Further workup has been negative for other causes of her symptoms.  She has not been very compliant with dialysis in the long-term setting.  Talked with jail yesterday and they are meeting on Monday to come up with a plan for the patient to maintain dialysis while she has housed at their facility.  Today patient states she continues to have some chest pain but is lying flat and appears to not be in significant distress.   Medications:  Current Facility-Administered Medications  Medication Dose Route Frequency Provider Last Rate Last Admin   Chlorhexidine Gluconate Cloth 2 % PADS 6 each  6 each Topical Q0600 Reesa Chew, MD       fentaNYL (SUBLIMAZE) injection 12.5 mcg  12.5 mcg Intravenous Q2H PRN Florencia Reasons, MD       hydrALAZINE (APRESOLINE) injection 10 mg  10 mg Intravenous Q6H PRN Myles Rosenthal A, MD       lidocaine (LIDODERM) 5 % 1 patch  1 patch Transdermal Q24H Kommor, [place], MD   1 patch at 10/24/22 1600   naloxone (NARCAN) injection 0.4 mg  0.4 mg Intravenous PRN Florencia Reasons, MD       nitroGLYCERIN (NITROGLYN) 2 % ointment 1 inch  1 inch Topical Q6H PRN Clance Boll, MD  Current Outpatient Medications  Medication Sig Dispense Refill   ARIPiprazole (ABILIFY) 5 MG tablet Take 1.5 tablets (7.5 mg total) by mouth daily. (Patient taking differently: Take 10 mg by mouth daily.) 45 tablet 1   b complex-vitamin c-folic acid (NEPHRO-VITE) 0.8 MG TABS tablet Take 1 tablet by mouth daily. 30 tablet 1   calcitRIOL (ROCALTROL) 0.25 MCG capsule Take 1 capsule (0.25 mcg total) by mouth Every Tuesday,Thursday,and Saturday with dialysis. (Patient taking differently:  Take 0.25 mcg by mouth every Monday, Wednesday, and Friday with hemodialysis.) 30 capsule 3   calcium acetate (PHOSLO) 667 MG capsule Take 1 capsule (667 mg total) by mouth 3 (three) times daily with meals. 30 capsule 3   dicyclomine (BENTYL) 20 MG tablet Take 1 tablet (20 mg total) by mouth every 6 (six) hours as needed for spasms (abdominal cramping). 30 tablet 0   gabapentin (NEURONTIN) 300 MG capsule Take 1 capsule (300 mg total) by mouth 2 (two) times daily. 60 capsule 1   hydrOXYzine (VISTARIL) 25 MG capsule Take 1 capsule (25 mg total) by mouth every 8 (eight) hours as needed for anxiety. (Patient taking differently: Take 50 mg by mouth every 8 (eight) hours as needed for anxiety.) 60 capsule 2   lidocaine-prilocaine (EMLA) cream Apply 1 application  topically daily as needed (port site access on dialysis days (Mondays, Wednesdays, Fridays).     losartan (COZAAR) 50 MG tablet Take 1 tablet (50 mg total) by mouth daily. 30 tablet 3   methocarbamol (ROBAXIN) 500 MG tablet Take 1 tablet (500 mg total) by mouth every 8 (eight) hours as needed for muscle spasms. 30 tablet 0   sevelamer carbonate (RENVELA) 800 MG tablet Take 2 tablets (1,600 mg total) by mouth 3 (three) times daily with meals. 180 tablet 0   torsemide (DEMADEX) 100 MG tablet Take 1 tablet (100 mg total) by mouth every morning. 30 tablet 3   traZODone (DESYREL) 100 MG tablet Take 1 tablet (100 mg total) by mouth at bedtime. 30 tablet 1     ALLERGIES Bupropion, Prozac [fluoxetine hcl], and Prednisone  MEDICAL HISTORY Past Medical History:  Diagnosis Date   Asthma    as a child, no problem as an adult, no inhaler   Complication of anesthesia    woke up before tube removed, 1 time fought nurses   ESRD on hemodialysis [facility])    M-W-F   History of borderline personality disorder    Hypertension    diagnosed as child; stopped meds at 67 yo   Insomnia    Neuromuscular disorder ([place])    peripheral neuropathy   Nonspecific chest  pain    PTSD (post-traumatic stress disorder)      SOCIAL HISTORY Social History   Socioeconomic History   Marital status: Soil scientist    Spouse name: Not on file   Number of children: 1   Years of education: Not on file   Highest education level: 12th grade  Occupational History   Occupation: unemployed  Tobacco Use   Smoking status: Every Day    Packs/day: 0.50    Years: 10.00    Total pack years: 5.00    Types: Cigarettes   Smokeless tobacco: Never  Vaping Use   Vaping Use: Never used  Substance and Sexual Activity   Alcohol use: Not Currently    Comment: "maybe 3 a month."- liquor   Drug use: Yes    Types: Marijuana, Cocaine    Comment: reports daily use of both  Sexual activity: Not Currently    Birth control/protection: None  Other Topics Concern   Not on file  Social History Narrative   Not on file   Social Determinants of Health   Financial Resource Strain: Unknown (01/13/2019)   Overall Financial Resource Strain (CARDIA)    Difficulty of Paying Living Expenses: Patient refused  Food Insecurity: Food Insecurity Present (08/25/2022)   Hunger Vital Sign    Worried About Running Out of Food in the Last Year: Often true    Ran Out of Food in the Last Year: Often true  Transportation Needs: Unmet Transportation Needs (08/25/2022)   PRAPARE - Hydrologist (Medical): Yes    Lack of Transportation (Non-Medical): Yes  Physical Activity: Unknown (01/13/2019)   Exercise Vital Sign    Days of Exercise per Week: Patient refused    Minutes of Exercise per Session: Patient refused  Stress: Unknown (01/13/2019)   [place]    Feeling of Stress : Patient refused  Social Connections: Unknown (01/13/2019)   Social Connection and Isolation Panel [NHANES]    Frequency of Communication with Friends and Family: Patient refused    Frequency of Social Gatherings with Friends and  Family: Patient refused    Attends Religious Services: Patient refused    Active Member of Clubs or Organizations: Patient refused    Attends Archivist Meetings: Patient refused    Marital Status: Patient refused  Intimate Partner Violence: Not At Risk (08/25/2022)   Humiliation, Afraid, Rape, and Kick questionnaire    Fear of Current or Ex-Partner: No    Emotionally Abused: No    Physically Abused: No    Sexually Abused: No     FAMILY HISTORY Family History  Adopted: Yes  Problem Relation Age of Onset   Diabetes Other    Hypertension Other      Review of Systems: 12 systems were reviewed and negative except per HPI  Physical Exam: Vitals:   10/25/22 0500 10/25/22 0652  BP: (!) 170/123   Pulse: (!) 110   Resp: (!) 26   Temp:  98.9 F (37.2 C)  SpO2: 97%    No intake/output data recorded.  Intake/Output Summary (Last 24 hours) at 10/25/2022 0911 Last data filed at 10/25/2022 0045 Gross per 24 hour  Intake --  Output 3500 ml  Net -3500 ml   General: well-appearing, no acute distress HEENT: anicteric sclera, MMM CV: normal rate, no rub, 2+ pitting edema in ble Lungs: bilateral chest rise, normal wob Abd: soft, non-tender, non-distended Skin: no visible lesions or rashes Psych: alert, engaged, appropriate mood and affect Neuro: normal speech, no gross focal deficits   Test Results Reviewed Lab Results  Component Value Date   NA 134 (L) 10/25/2022   K 4.9 10/25/2022   CL 98 10/24/2022   CO2 20 (L) 10/24/2022   BUN 131 (H) 10/24/2022   CREATININE 16.54 (H) 10/24/2022   CALCIUM 8.5 (L) 10/24/2022   ALBUMIN 2.7 (L) 10/24/2022   PHOS 7.5 (H) 10/07/2022    I have reviewed relevant outside healthcare records

## 2022-10-25 NOTE — ED Notes (Addendum)
Attempted to use patients IV for blood work. Flush prior to blood draw unsuccessful. Patient complaining of pain around IV. IV team order placed to reassess and place new IV if needed. MD made aware.

## 2022-10-25 NOTE — Progress Notes (Signed)
  Echocardiogram 2D Echocardiogram has been performed.  Natasha Long 10/25/2022, 10:48 AM

## 2022-10-25 NOTE — Progress Notes (Signed)
Received patient in stretcher to unit. Alert and orientedx4  police @ bedisde.  Informed consent signed and in chart. 10/24/22   Treatment initiated: 2115 Treatment completed: 0045   Patient tolerated well. Transported back to the ed Alert, without acute distress. Hand-off given to patient's nurse.   Access used: AVF 15g Access issues: none Dressing: gauze and paper tape Total UF removed: 3500 Medication(s) given: none Post HD VS: see table below Post HD weight: refuse pre and post weight   10/25/22 0112  Vitals  BP (!) 175/115  MAP (mmHg) 113  BP Location Right Arm  BP Method Automatic  Patient Position (if appropriate) Lying  Pulse Rate (!) 111  Pulse Rate Source Monitor  ECG Heart Rate (!) 110  Resp (!) 22  Oxygen Therapy  SpO2 97 %  O2 Device Room Air  Patient Activity (if Appropriate) In bed  Pulse Oximetry Type Continuous  During Treatment Monitoring  Intra-Hemodialysis Comments Tolerated well

## 2022-10-25 NOTE — ED Provider Notes (Signed)
EKG Interpretation  Date/Time:  Sunday October 25 2022 01:32:03 EST Ventricular Rate:  109 PR Interval:  115 QRS Duration: 106 QT Interval:  329 QTC Calculation: 443 R Axis:   -19 Text Interpretation: Sinus tachycardia Borderline left axis deviation Low voltage, extremity and precordial leads Confirmed by Ripley Fraise [phone]) on 10/25/2022 1:45:32 AM        I assumed care of patient after she came back from dialysis. Patient is currently incarcerated at [facility] She came to the ER on November 25 for back pain and missing dialysis. She was noted to be hyperkalemic and was sent for dialysis.  CT imaging of her L-spine was negative.  Patient came back from dialysis but was still tachycardic and mildly tachypneic.  She still reports back pain. Rectal temp was 99.  Repeat chest x-ray showed stable cardiomegaly and congestion and pleural effusion. At this point unclear cause of her tachycardia.  Due to persistent symptoms, feel she is safer for admission for further evaluation.  I have ordered blood cultures and a lactic acid, and will give antibiotics.  If back pain persist, she may need MRI imaging.  Discussed with Dr. Marcello Moores with hospitalist.  She will admit the patient, but request CT chest to rule out PE.  This has been ordered.  Patient reports she has been on dialysis for over a year.   Ripley Fraise, MD 10/25/22 [phone]

## 2022-10-26 ENCOUNTER — Other Ambulatory Visit: Payer: Self-pay

## 2022-10-26 DIAGNOSIS — I160 Hypertensive urgency: Secondary | ICD-10-CM | POA: Diagnosis not present

## 2022-10-26 LAB — COMPREHENSIVE METABOLIC PANEL
ALT: 21 U/L (ref 0–44)
AST: 23 U/L (ref 15–41)
Albumin: 2.4 g/dL — ABNORMAL LOW (ref 3.5–5.0)
Alkaline Phosphatase: 50 U/L (ref 38–126)
Anion gap: 18 — ABNORMAL HIGH (ref 5–15)
BUN: 99 mg/dL — ABNORMAL HIGH (ref 6–20)
CO2: 21 mmol/L — ABNORMAL LOW (ref 22–32)
Calcium: 8.9 mg/dL (ref 8.9–10.3)
Chloride: 96 mmol/L — ABNORMAL LOW (ref 98–111)
Creatinine, Ser: 13 mg/dL — ABNORMAL HIGH (ref 0.44–1.00)
GFR, Estimated: 4 mL/min — ABNORMAL LOW (ref >60)
Glucose, Bld: 106 mg/dL — ABNORMAL HIGH (ref 70–99)
Potassium: 5.6 mmol/L — ABNORMAL HIGH (ref 3.5–5.1)
Sodium: 135 mmol/L (ref 135–145)
Total Bilirubin: 0.6 mg/dL (ref 0.3–1.2)
Total Protein: 6.6 g/dL (ref 6.5–8.1)

## 2022-10-26 LAB — HEPATITIS B SURFACE ANTIGEN: Hepatitis B Surface Ag: NONREACTIVE

## 2022-10-26 LAB — CBC
HCT: 29.6 % — ABNORMAL LOW (ref 36.0–46.0)
Hemoglobin: 9 g/dL — ABNORMAL LOW (ref 12.0–15.0)
MCH: 30.5 pg (ref 26.0–34.0)
MCHC: 30.4 g/dL (ref 30.0–36.0)
MCV: 100.3 fL — ABNORMAL HIGH (ref 80.0–100.0)
Platelets: 170 10*3/uL (ref 150–400)
RBC: 2.95 MIL/uL — ABNORMAL LOW (ref 3.87–5.11)
RDW: 20.1 % — ABNORMAL HIGH (ref 11.5–15.5)
WBC: 12.6 10*3/uL — ABNORMAL HIGH (ref 4.0–10.5)
nRBC: 0 % (ref 0.0–0.2)

## 2022-10-26 MED ORDER — ARIPIPRAZOLE 10 MG PO TABS
10.0000 mg | ORAL_TABLET | Freq: Every day | ORAL | Status: DC
  Administered 2022-10-26 – 2022-10-29 (×4): 10 mg via ORAL
  Filled 2022-10-26 (×4): qty 1

## 2022-10-26 MED ORDER — FENTANYL CITRATE PF 50 MCG/ML IJ SOSY
12.5000 ug | PREFILLED_SYRINGE | INTRAMUSCULAR | Status: DC | PRN
  Administered 2022-10-26 – 2022-10-27 (×12): 12.5 ug via INTRAVENOUS
  Filled 2022-10-26 (×12): qty 1

## 2022-10-26 MED ORDER — HYDRALAZINE HCL 25 MG PO TABS
25.0000 mg | ORAL_TABLET | Freq: Four times a day (QID) | ORAL | Status: DC | PRN
  Administered 2022-10-27 – 2022-10-28 (×2): 25 mg via ORAL
  Filled 2022-10-26 (×2): qty 1

## 2022-10-26 MED ORDER — DILTIAZEM HCL 60 MG PO TABS
60.0000 mg | ORAL_TABLET | Freq: Four times a day (QID) | ORAL | Status: DC
  Administered 2022-10-26 – 2022-10-27 (×3): 60 mg via ORAL
  Filled 2022-10-26 (×6): qty 1

## 2022-10-26 MED ORDER — SODIUM CHLORIDE 0.9 % IV SOLN
250.0000 mg | INTRAVENOUS | Status: DC
  Administered 2022-10-26 – 2022-10-28 (×2): 250 mg via INTRAVENOUS
  Filled 2022-10-26 (×2): qty 20

## 2022-10-26 NOTE — Progress Notes (Addendum)
Contacted by ED CSW this am of pt being in the ED. Pt is currently in the custody of [facility]. Contacted by renal PA inquiring about jail's plan for out-pt HD for pt at d/c from ED. Contacted ED CSW to discuss pt's case. CSW has contacted jail medical staff and provided staff the contact information for jail staff to contact [facility] staff Wells Guiles [place] [phone]) to discuss the financial contract that is needed for pt to receive out-pt HD while pt is in custody. Contacted clinic manager at [facility] to make her aware of the situation as well. Will assist as needed.   Melven Sartorius Renal Navigator [phone]  Addendum at 4:05 pm: Advised by Wells Guiles with [employer] that she has communicated with [facility] staff regarding pt's HD needs at d/c. Awaiting final approval of arrangements between [employer] and [employer].

## 2022-10-26 NOTE — ED Notes (Signed)
Paper consent completed for dialysis

## 2022-10-26 NOTE — Progress Notes (Signed)
Called to get handoff nurse report, staff states nurse josh not available and will have nurse to call back

## 2022-10-26 NOTE — Progress Notes (Addendum)
12pm: CSW spoke with Junie Panning, pharmacist at the [facility] jail to inform her of information. CSW provided Junie Panning with contact information for Reynaldo Minium of [employer] [phone])  to negotiate a contract for HD treatments.   8:25am: CSW notified Olivia Mackie, renal navigator or patient's presence in the ED.  CSW attempted to reach medical staff at the [facility] without success - a voicemail was left requesting a return call.  Madilyn Fireman, MSW, LCSW Transitions of Care  Clinical Social Worker II [phone]

## 2022-10-26 NOTE — Progress Notes (Signed)
Sheriff dept back at bedside

## 2022-10-26 NOTE — Progress Notes (Signed)
[facility] night cross cover note:  I resumed previous order for Fentanyl 12.5 mcg iv q2 hours prn for the patient's back pain after the previous order expired following associated 6 doses.     Babs Bertin, DO Hospitalist

## 2022-10-26 NOTE — Progress Notes (Signed)
Heart Failure Navigator Progress Note  Assessed for Heart & Vascular TOC clinic readiness.  Patient does not meet criteria due to ESRD on hemodialysis.   Navigator will sign off at this time.    Amillia Biffle, BSN, RN Heart Failure Nurse Navigator Secure Chat Only   

## 2022-10-26 NOTE — Progress Notes (Addendum)
PROGRESS NOTE    Natasha Long  AST:[id number] DOB: [birth year] DOA: 10/24/2022 PCP: [name], MD    Brief Narrative:   Natasha Long is a 26 y.o. female with past medical history significant for borderline personality disorder, PTSD, ESRD on HD TTS, HTN, severe MR, chronic systolic congestive heart failure (LVEF 40-45%), chronic back pain, polysubstance abuse who presents to [facility] ED via EMS from [facility] jail with complaints of pleuritic chest pain, shortness of breath, productive cough, lower back and bilateral lower extremity pain.  Patient reports onset of symptoms roughly 1 week ago associated with chills.  Denies fever, no nausea/vomiting, no abdominal pain, no diarrhea.  Patient reported compliance with all of her medications but she has history of medical noncompliance in the past with multiple AMA discharges.  In the ED, BP 180/114, HR 104, RR 21, SpO2 97% 4 L nasal cannula.  WBC 10.7, hemoglobin 9.4, platelets 173.  Chest x-ray with moderate right pleural effusion, pulmonary vascular congestion consistent with pulmonary edema.  CTA chest negative for PE, stable cardiomegaly with moderate to large pericardial effusion, moderate right pleural effusion, marked diffuse edema throughout the chest wall and abdominal wall consistent with anasarca.  CT L-spine with no acute fracture, degenerative disc disease with bulging annulus at L5-S1 without significant spinal or foraminal stenosis.  Sodium 139, potassium 6.2, chloride 98, CO2 20, glucose 109, BUN 131, creatinine 16.54.  AST 27, ALT 28, total bilirubin 0.6.  High sensitive troponin 682.  BNP greater than 4500.  Nephrology was consulted.  TRH consulted for admission for further evaluation and management of decompensated CHF exacerbation due to volume overload and medical noncompliance.  Assessment & Plan:   Acute respiratory failure with hypoxia Acute on chronic systolic/diastolic congestive heart failure exacerbation,  decompensated Patient presenting from jail with progressive shortness of breath, pleuritic chest pain and cough.  Patient with elevated BNP greater than 4500 and chest x-ray findings of pulmonary edema.  CT angiogram chest negative for pulmonary embolism.  Also with moderate to large pleural effusion.  Etiology likely secondary to medical noncompliance given her multiple hospitalizations and AMA discharges, now patient resident of [facility] jail. -- Nephrology following, appreciate assistance -- 1200 mL fluid restriction -- Strict I's and O's and daily weights -- Continue supplemental oxygen, maintain SpO2 greater than 92%  Pericardial effusion Patient with moderate to large pericardial effusion noted on CT chest.  TTE with LVEF 40-45%, LV mildly decreased function with global hypokinesis, biatrial enlargement, severe concentric LVH, grade 2 diastolic dysfunction, moderate pericardial effusion that is circumferential with no evidence of cardiac tamponade, severe MR IVC dilated, mild-moderate aortic valve stenosis.  Patient with a known history of pericardial effusion and was discussed with cardiology, Dr. Domenic Polite who recommended echo and no formal consult if no tamponade noted. -- Volume management with HD  Hypertensive urgency -- Cardizem 60 mg p.o. every 6 hours -- Hydralazine 25 mg p.o. q6h PRN SBP >165 or DBP >110 -- Will avoid beta-blockers in setting of history of cocaine abuse  Rhinovirus Respiratory viral panel positive for rhinovirus. -- Droplet precautions, supportive care, supplemental oxygen to maintain SpO2 greater than 92%  Borderline personality disorder PTSD Seen by psychiatry during previous admission. --Abilify 10 mg p.o. daily -- Hydroxyzine 25 mg TID PRN anxiety  Polysubstance abuse Patient with history of cocaine, tobacco, marijuana abuse.  Counseled on need for complete cessation.  Morbid obesity Discussed with patient needs for aggressive lifestyle  changes/weight loss as this complicates all facets of  care.  Outpatient follow-up with PCP.      DVT prophylaxis: heparin injection 5,000 Units Start: 10/25/22 1400    Code Status: Full Code Family Communication: No family present at bedside this morning, updated shower staff PD present  Disposition Plan:  Level of care: Progressive Status is: Inpatient Remains inpatient appropriate because: Needs further hemodialysis and titration off of supplemental oxygen.    Consultants:  Nephrology  Procedures:  None  Antimicrobials:  Vancomycin 11/25 - 11/25   Subjective: Patient seen examined bedside, lying in bed.  Remains in ED holding area.  [name] present.  Complaining of persistent shortness of breath, low back pain.  Waiting for further HD today.  No other specific complaints or concerns at this time.  Denies headache, no visual changes, no chest pain, no palpitations, no abdominal pain, no focal weakness, no fever/chills/night sweats, no nausea/vomiting/diarrhea.  No acute events overnight per nursing staff.  Objective: Vitals:   10/26/22 0352 10/26/22 0600 10/26/22 0700 10/26/22 0800  BP: (!) 177/115 (!) 178/121 (!) 171/104 (!) 171/108  Pulse: 100  (!) 103 94  Resp: 14  20 20   Temp: 97.7 F (36.5 C)  98 F (36.7 C) 98.1 F (36.7 C)  TempSrc: Oral     SpO2: 98%  100% 100%    Intake/Output Summary (Last 24 hours) at 10/26/2022 0901 Last data filed at 10/25/2022 2321 Gross per 24 hour  Intake 50 ml  Output --  Net 50 ml   There were no vitals filed for this visit.  Examination:  Physical Exam: GEN: NAD, alert and oriented x 3, medically ill in appearance, appears older than stated age 44: NCAT, PERRL, EOMI, sclera clear, MMM PULM: Breath sounds diminished bilateral bases with crackles, no wheezing, normal respiratory effort without accessory muscle use, on 4 L Venturi mask with SpO2 98% at rest CV: RRR w +SEM, no gallop/rub GI: abd soft, NTND, NABS, no  R/G/M MSK: 2+ peripheral edema, moves all extremities independently NEURO: CN II-XII intact, no focal deficits, sensation to light touch intact PSYCH: Anxious mood Integumentary: dry/intact, no rashes or wounds    Data Reviewed: I have personally reviewed following labs and imaging studies  CBC: Recent Labs  Lab 10/21/22 0914 10/24/22 1513 10/25/22 0527 10/26/22 0404  WBC 8.6 10.7*  --  12.6*  NEUTROABS 6.5 9.1*  --   --   HGB 9.7* 9.4* 8.8* 9.0*  HCT 30.8* 29.8* 26.0* 29.6*  MCV 100.3* 99.3  --  100.3*  PLT 238 173  --  641   Basic Metabolic Panel: Recent Labs  Lab 10/21/22 0914 10/24/22 1513 10/25/22 0527 10/26/22 0404  NA 141 139 134* 135  K 6.7* 6.2* 4.9 5.6*  CL 102 98  --  96*  CO2 16* 20*  --  21*  GLUCOSE 112* 109*  --  106*  BUN 155* 131*  --  99*  CREATININE 19.06* 16.54*  --  13.00*  CALCIUM 9.3 8.5*  --  8.9   GFR: Estimated Creatinine Clearance: 7.6 mL/min (A) (by C-G formula based on SCr of 13 mg/dL (H)). Liver Function Tests: Recent Labs  Lab 10/21/22 0914 10/24/22 1513 10/26/22 0404  AST 62* 27 23  ALT 43 28 21  ALKPHOS 77 54 50  BILITOT 0.6 0.6 0.6  PROT 7.4 6.9 6.6  ALBUMIN 3.4* 2.7* 2.4*   No results for input(s): "LIPASE", "AMYLASE" in the last 168 hours. No results for input(s): "AMMONIA" in the last 168 hours. Coagulation Profile: No  results for input(s): "INR", "PROTIME" in the last 168 hours. Cardiac Enzymes: No results for input(s): "CKTOTAL", "CKMB", "CKMBINDEX", "TROPONINI" in the last 168 hours. BNP (last 3 results) No results for input(s): "PROBNP" in the last 8760 hours. HbA1C: No results for input(s): "HGBA1C" in the last 72 hours. CBG: No results for input(s): "GLUCAP" in the last 168 hours. Lipid Profile: No results for input(s): "CHOL", "HDL", "LDLCALC", "TRIG", "CHOLHDL", "LDLDIRECT" in the last 72 hours. Thyroid Function Tests: No results for input(s): "TSH", "T4TOTAL", "FREET4", "T3FREE", "THYROIDAB" in the  last 72 hours. Anemia Panel: Recent Labs    10/25/22 0921  FERRITIN 199  TIBC 217*  IRON 13*   Sepsis Labs: Recent Labs  Lab 10/25/22 0553 10/25/22 0607  PROCALCITON 2.02  --   LATICACIDVEN 0.4* 0.8    Recent Results (from the past 240 hour(s))  Blood culture (routine x 2)     Status: None (Preliminary result)   Collection Time: 10/25/22  5:53 AM   Specimen: BLOOD  Result Value Ref Range Status   Specimen Description BLOOD SITE NOT SPECIFIED  Final   Special Requests   Final    BOTTLES DRAWN AEROBIC AND ANAEROBIC Blood Culture adequate volume   Culture   Final    NO GROWTH 1 DAY Performed at [facility], [place] [address], [place], [place] [address]    Report Status PENDING  Incomplete  Blood culture (routine x 2)     Status: None (Preliminary result)   Collection Time: 10/25/22 11:11 AM   Specimen: BLOOD  Result Value Ref Range Status   Specimen Description BLOOD SITE NOT SPECIFIED  Final   Special Requests   Final    BOTTLES DRAWN AEROBIC AND ANAEROBIC Blood Culture adequate volume   Culture   Final    NO GROWTH < 24 HOURS Performed at [facility], [place] [address] [place], [place] [address]    Report Status PENDING  Incomplete  Respiratory (~20 pathogens) panel by PCR     Status: Abnormal   Collection Time: 10/25/22  4:57 PM   Specimen: Nasopharyngeal Swab; Respiratory  Result Value Ref Range Status   Adenovirus NOT DETECTED NOT DETECTED Final   Coronavirus 229E NOT DETECTED NOT DETECTED Final    Comment: (NOTE) The Coronavirus on the Respiratory Panel, DOES NOT test for the novel  Coronavirus (2019 nCoV)    Coronavirus HKU1 NOT DETECTED NOT DETECTED Final   Coronavirus NL63 NOT DETECTED NOT DETECTED Final   Coronavirus OC43 NOT DETECTED NOT DETECTED Final   Metapneumovirus NOT DETECTED NOT DETECTED Final   Rhinovirus / Enterovirus DETECTED (A) NOT DETECTED Final   Influenza A NOT DETECTED NOT DETECTED Final   Influenza B NOT DETECTED NOT  DETECTED Final   Parainfluenza Virus 1 NOT DETECTED NOT DETECTED Final   Parainfluenza Virus 2 NOT DETECTED NOT DETECTED Final   Parainfluenza Virus 3 NOT DETECTED NOT DETECTED Final   Parainfluenza Virus 4 NOT DETECTED NOT DETECTED Final   Respiratory Syncytial Virus NOT DETECTED NOT DETECTED Final   Bordetella pertussis NOT DETECTED NOT DETECTED Final   Bordetella Parapertussis NOT DETECTED NOT DETECTED Final   Chlamydophila pneumoniae NOT DETECTED NOT DETECTED Final   Mycoplasma pneumoniae NOT DETECTED NOT DETECTED Final    Comment: Performed at [facility], [place]. [address] [place], [place] [address]         Radiology Studies: ECHOCARDIOGRAM COMPLETE  Result Date: 10/25/2022    ECHOCARDIOGRAM REPORT   Patient Name:  [name] Date of Exam: 10/25/2022 Medical Rec #:  [id number]         Height:       68.0 in Accession #:    [id number]        Weight:       200.0 lb Date of Birth:  July 08, 1996         BSA:          2.044 m Patient Age:    26 years          BP:           170/123 mmHg Patient Gender: F                 HR:           108 bpm. Exam Location:  Inpatient Procedure: 2D Echo, Cardiac Doppler, Color Doppler and 3D Echo STAT ECHO Indications:    Pericardial effusion  History:        Patient has prior history of Echocardiogram examinations, most                 recent 08/25/2022. Mitral Valve Disease, Signs/Symptoms:Shortness                 of Breath; Risk Factors:Hypertension. Polysubstance abuse.                 HFrEF. ESRD.  Sonographer:    Clayton Lefort RDCS (AE) Referring Phys: [place]  1. Left ventricular ejection fraction, by estimation, is 40 to 45%. Left ventricular ejection fraction by 3D volume is 43 %. The left ventricle has mildly decreased function. The left ventricle demonstrates global hypokinesis. The left ventricular internal cavity size was mildly dilated. There is severe concentric left ventricular hypertrophy. Left ventricular diastolic parameters  are consistent with Grade II diastolic dysfunction (pseudonormalization). Elevated left atrial pressure.  2. Right ventricular systolic function is normal. The right ventricular size is moderately enlarged. There is severely elevated pulmonary artery systolic pressure. The estimated right ventricular systolic pressure is 24.4 mmHg.  3. Left atrial size was mildly dilated.  4. Right atrial size was mildly dilated.  5. Moderate pericardial effusion. The pericardial effusion is circumferential. There is no evidence of cardiac tamponade.  6. The mitral valve is degenerative. Severe mitral valve regurgitation. The mean mitral valve gradient is 7.1 mmHg with average heart rate of 110 bpm. Moderate to severe mitral annular calcification.  7. The tricuspid valve is abnormal. 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  8. The aortic valve is tricuspid. There is mild calcification of the aortic valve. There is mild thickening of the aortic valve. Aortic valve regurgitation is mild to moderate. Mild to moderate aortic valve stenosis.  9. The inferior vena cava is dilated in size with <50% respiratory variability, suggesting right atrial pressure of 15 mmHg. Comparison(s): No significant change from prior study. Prior images reviewed side by side. FINDINGS  Left Ventricle: Left ventricular ejection fraction, by estimation, is 40 to 45%. Left ventricular ejection fraction by 3D volume is 43 %. The left ventricle has mildly decreased function. The left ventricle demonstrates global hypokinesis. The left ventricular internal cavity size was mildly dilated. There is severe concentric left ventricular hypertrophy. Left ventricular diastolic parameters are consistent with Grade II diastolic dysfunction (pseudonormalization). Elevated left atrial pressure. Right Ventricle: The right ventricular size is moderately enlarged. No increase in right ventricular wall thickness. Right ventricular systolic function is normal. There is severely  elevated pulmonary artery systolic pressure. The tricuspid  regurgitant velocity is 3.51 m/s, and with an assumed right atrial pressure of 15 mmHg, the estimated right ventricular systolic pressure is 85.2 mmHg. Left Atrium: Left atrial size was mildly dilated. Right Atrium: Right atrial size was mildly dilated. Pericardium: A moderately sized pericardial effusion is present. The pericardial effusion is circumferential. There is no evidence of cardiac tamponade. Mitral Valve: The mitral valve is degenerative in appearance. Moderate to severe mitral annular calcification. Severe mitral valve regurgitation, with centrally-directed jet. The mean mitral valve gradient is 7.1 mmHg with average heart rate of 110 bpm. Tricuspid Valve: The tricuspid valve is abnormal. 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 Aortic Valve: The aortic valve is tricuspid. There is mild calcification of the aortic valve. There is mild thickening of the aortic valve. Aortic valve regurgitation is mild to moderate. Mild to moderate aortic stenosis is present. Aortic valve mean gradient measures 26.0 mmHg. Aortic valve peak gradient measures 47.1 mmHg. Aortic valve area, by VTI measures 1.96 cm. Pulmonic Valve: The pulmonic valve was normal in structure. Pulmonic valve regurgitation is trivial. Aorta: The aortic root is normal in size and structure. Venous: The inferior vena cava is dilated in size with less than 50% respiratory variability, suggesting right atrial pressure of 15 mmHg. IAS/Shunts: No atrial level shunt detected by color flow Doppler. Additional Comments: There is pleural effusion in the left lateral region.  LEFT VENTRICLE PLAX 2D LVIDd:         5.80 cm LVIDs:         4.40 cm LV PW:         1.80 cm         3D Volume EF LV IVS:        1.60 cm         LV 3D EF:    Left LVOT diam:     2.10 cm                      ventricul LV SV:         99                           ar LV SV Index:   48                           ejection LVOT Area:      3.46 cm                     fraction                                             by 3D                                             volume is                                             43 %.  3D Volume EF:                                3D EF:        43 %                                LV EDV:       291 ml                                LV ESV:       167 ml                                LV SV:        124 ml RIGHT VENTRICLE             IVC RV Basal diam:  5.30 cm     IVC diam: 2.50 cm RV S prime:     17.70 cm/s TAPSE (M-mode): 1.9 cm LEFT ATRIUM           Index        RIGHT ATRIUM           Index LA diam:      3.40 cm 1.66 cm/m   RA Area:     20.40 cm LA Vol (A4C): 89.7 ml 43.89 ml/m  RA Volume:   55.40 ml  27.11 ml/m  AORTIC VALVE AV Area (Vmax):    1.93 cm AV Area (Vmean):   1.78 cm AV Area (VTI):     1.96 cm AV Vmax:           343.00 cm/s AV Vmean:          239.000 cm/s AV VTI:            0.504 m AV Peak Grad:      47.1 mmHg AV Mean Grad:      26.0 mmHg LVOT Vmax:         191.00 cm/s LVOT Vmean:        123.000 cm/s LVOT VTI:          0.285 m LVOT/AV VTI ratio: 0.57  AORTA Ao Root diam: 3.00 cm MITRAL VALVE              TRICUSPID VALVE MV Area (PHT): 2.51 cm   TR Peak grad:   49.3 mmHg MV Mean grad:  7.1 mmHg   TR Vmax:        351.00 cm/s MV Decel Time: 303 msec MR Peak grad: 122.8 mmHg  SHUNTS MR Mean grad: 83.0 mmHg   Systemic VTI:  0.29 m MR Vmax:      554.00 cm/s Systemic Diam: 2.10 cm MR Vmean:     438.0 cm/s Dani Gobble Croitoru MD Electronically signed by Sanda Klein MD Signature Date/Time: 10/25/2022/12:08:01 PM    Final    CT Angio Chest PE W and/or Wo Contrast  Result Date: 10/25/2022 CLINICAL DATA:  Rule out pulmonary embolus. EXAM: CT ANGIOGRAPHY CHEST WITH CONTRAST TECHNIQUE: Multidetector CT imaging of the chest was performed using the standard protocol during bolus administration of intravenous contrast. Multiplanar CT image reconstructions and MIPs were  obtained to evaluate the vascular anatomy. RADIATION DOSE REDUCTION: This exam was performed according to  the departmental dose-optimization program which includes automated exposure control, adjustment of the mA and/or kV according to patient size and/or use of iterative reconstruction technique. CONTRAST:  75mL OMNIPAQUE IOHEXOL 350 MG/ML SOLN COMPARISON:  09/04/2022 FINDINGS: Cardiovascular: Cardiac enlargement is stable. Moderate to large pericardial effusion is again noted. Satisfactory opacification of the pulmonary arteries to the segmental level. No evidence of acute pulmonary embolism. Mediastinum/Nodes: Thyroid gland, trachea and esophagus are unremarkable. No adenopathy identified. Diffuse edema noted throughout the mediastinal fat. Lungs/Pleura: There is been interval resolution of previous left pleural effusion. Unchanged appearance moderate right pleural effusion. Scattered areas of subsegmental atelectasis are identified bilaterally. No airspace disease identified. Upper Abdomen: No acute abnormality. Perihepatic ascites noted. Signs of previous cholecystectomy. Musculoskeletal: Marked diffuse edema is identified throughout the chest wall and abdominal wall. No acute or suspicious osseous findings. Review of the MIP images confirms the above findings. IMPRESSION: 1. No evidence for acute pulmonary embolism. 2. Stable cardiomegaly with moderate to large pericardial effusion. 3. Stable moderate right pleural effusion.Interval resolution of previous left pleural effusion. 4. Marked diffuse edema throughout the chest wall and abdominal wall compatible with anasarca. 5. Perihepatic ascites. Electronically Signed   By: Kerby Moors M.D.   On: 10/25/2022 08:44   DG Chest Port 1 View  Result Date: 10/25/2022 CLINICAL DATA:  Shortness of breath. EXAM: PORTABLE CHEST 1 VIEW COMPARISON:  October 24, 2022 FINDINGS: The cardiac silhouette is moderately enlarged and unchanged in size. Stable, moderate  severity atelectasis and/or infiltrate is seen within the bilateral lung bases. There is stable prominence of the perihilar pulmonary vasculature. A small left pleural effusion is seen. A moderate sized right pleural effusion is also noted. This is decreased in size when compared to the prior study. No pneumothorax is identified. The visualized skeletal structures are unremarkable. IMPRESSION: 1. Stable cardiomegaly and pulmonary vascular congestion. 2. Stable, moderate severity bibasilar atelectasis and/or infiltrate. 3. Moderate sized right-sided pleural effusion, decreased in size when compared to the prior exam. Electronically Signed   By: Virgina Norfolk M.D.   On: 10/25/2022 03:03   CT Lumbar Spine Wo Contrast  Result Date: 10/24/2022 CLINICAL DATA:  Trauma. Back pain. EXAM: CT LUMBAR SPINE WITHOUT CONTRAST TECHNIQUE: Multidetector CT imaging of the lumbar spine was performed without intravenous contrast administration. Multiplanar CT image reconstructions were also generated. RADIATION DOSE REDUCTION: This exam was performed according to the departmental dose-optimization program which includes automated exposure control, adjustment of the mA and/or kV according to patient size and/or use of iterative reconstruction technique. COMPARISON:  CT scan 08/31/2022 FINDINGS: Segmentation: There are five lumbar type vertebral bodies. The last full intervertebral disc space is labeled L5-S1. Alignment: Normal Vertebrae: No acute fracture. Paraspinal and other soft tissues: No significant paraspinal findings. Stable SI joint degenerative changes. Age advanced vascular calcifications. Moderate free pelvic fluid. Severe diffuse body wall edema. Disc levels: Stable advanced degenerative disc disease at L5-S1 with bulging annulus and osteophytic ridging but no significant spinal or foraminal stenosis in the lumbar spine. IMPRESSION: 1. Normal alignment of the lumbar vertebral bodies and no acute fracture. 2. Stable  advanced degenerative disc disease at L5-S1 with bulging annulus and osteophytic ridging but no significant spinal or foraminal stenosis. 3. Age advanced vascular calcifications. 4. Moderate free pelvic fluid and severe diffuse body wall edema. Electronically Signed   By: Marijo Sanes M.D.   On: 10/24/2022 18:51   DG Chest Portable 1 View  Result Date: 10/24/2022 CLINICAL DATA:  Dyspnea after missing dialysis EXAM:  PORTABLE CHEST 1 VIEW COMPARISON:  Radiograph 10/04/2022 FINDINGS: Interval increase in the now moderate right pleural effusion with similar small left pleural effusion. Similar cardiomegaly. Pulmonary vascular congestion has increased since 10/04/2022 with lower lung infiltrates which may represent edema or infection. No acute osseous abnormality. IMPRESSION: Constellation of findings favored to represent worsening CHF. Increased moderate right and similar small left pleural effusions. Electronically Signed   By: Placido Sou M.D.   On: 10/24/2022 16:25        Scheduled Meds:  calcitRIOL  0.25 mcg Oral Daily   calcium acetate  667 mg Oral TID WC   Chlorhexidine Gluconate Cloth  6 each Topical Q0600   darbepoetin (ARANESP) injection - DIALYSIS  200 mcg Subcutaneous Q Mon-1800   diltiazem  60 mg Oral Q6H   heparin  5,000 Units Subcutaneous Q8H   lidocaine  1 patch Transdermal Q24H   sodium chloride  1 spray Each Nare Once   Continuous Infusions:   LOS: 1 day    Time spent: 52 minutes spent on chart review, discussion with nursing staff, consultants, updating family and interview/physical exam; more than 50% of that time was spent in counseling and/or coordination of care.    Maiana Hennigan J [place] DO [facility] Available via Epic secure chat 7am-7pm After these hours, please refer to coverage provider listed on [email or web address] 10/26/2022, 9:01 AM

## 2022-10-26 NOTE — Progress Notes (Signed)
Sheriff dept away from bedside

## 2022-10-26 NOTE — Progress Notes (Signed)
Received patient in bed to unit. Sheriff deputy at bedside, pt with bilateral ankles cuffed to stretcher Alert and oriented.  Informed consent signed and in chart.   Treatment initiated:  Treatment completed:   Patient tolerated. Ended treatment at pt request d/t "not feeling well" and reports of "pain" at fistula site, elevated venous alarm pressures and elevated TMP pressures noted.  Transported back to the room  Alert, without acute distress. Pt reports "hurting" at fistula site, fistula with positive thrill and bruit without signs of infiltration.  Hand-off given to patient's nurse.   Access used: fistula left arm  Access issues: none.   Total UF removed: 3400 ml Medication(s) given: fentanyl, ferrous gluconate, lidocaine patch Post HD weight:    [facility]  10/26/22 1404  Vitals  BP (!) 165/104  MAP (mmHg) 122  ECG Heart Rate (!) 109  Resp 20  Oxygen Therapy  SpO2 100 %  O2 Device Venturi Mask  O2 Flow Rate (L/min) 4 L/min

## 2022-10-26 NOTE — Progress Notes (Addendum)
[facility] Progress Note   Subjective:   Patient seen and examined at bedside in dialysis.  Currently incarcerated with ankle cuffs in place and Sheriff dept at bedside.  Minimally interactive.  Lifts her head with repeat verbal stimuli.  Admits to SOB.    Objective Vitals:   10/26/22 0930 10/26/22 1000 10/26/22 1030 10/26/22 1100  BP: (!) 171/102 (!) 166/103 (!) 167/94 (!) 164/89  Pulse:  95 98 (!) 102  Resp: 16     Temp:      TempSrc:      SpO2: 94% 99% 100% 100%   Physical Exam General:chronically ill appearing female in NAD Heart:RRR, no mrg Lungs:CTAB, nml WOB on RA Abdomen:soft, non distended Extremities:1+ LE edema Dialysis Access: LU AVF in use   There were no vitals filed for this visit.  Intake/Output Summary (Last 24 hours) at 10/26/2022 1119 Last data filed at 10/25/2022 2321 Gross per 24 hour  Intake 50 ml  Output --  Net 50 ml    Additional Objective Labs: Basic Metabolic Panel: Recent Labs  Lab 10/21/22 0914 10/24/22 1513 10/25/22 0527 10/26/22 0404  NA 141 139 134* 135  K 6.7* 6.2* 4.9 5.6*  CL 102 98  --  96*  CO2 16* 20*  --  21*  GLUCOSE 112* 109*  --  106*  BUN 155* 131*  --  99*  CREATININE 19.06* 16.54*  --  13.00*  CALCIUM 9.3 8.5*  --  8.9   Liver Function Tests: Recent Labs  Lab 10/21/22 0914 10/24/22 1513 10/26/22 0404  AST 62* 27 23  ALT 43 28 21  ALKPHOS 77 54 50  BILITOT 0.6 0.6 0.6  PROT 7.4 6.9 6.6  ALBUMIN 3.4* 2.7* 2.4*    CBC: Recent Labs  Lab 10/21/22 0914 10/24/22 1513 10/25/22 0527 10/26/22 0404  WBC 8.6 10.7*  --  12.6*  NEUTROABS 6.5 9.1*  --   --   HGB 9.7* 9.4* 8.8* 9.0*  HCT 30.8* 29.8* 26.0* 29.6*  MCV 100.3* 99.3  --  100.3*  PLT 238 173  --  170   Blood Culture    Component Value Date/Time   SDES BLOOD SITE NOT SPECIFIED 10/25/2022 1111   SPECREQUEST  10/25/2022 1111    BOTTLES DRAWN AEROBIC AND ANAEROBIC Blood Culture adequate volume   CULT  10/25/2022 1111    NO  GROWTH < 24 HOURS Performed at [facility], [place] [address] [place], [place] [address]    REPTSTATUS PENDING 10/25/2022 1111    Iron Studies:  Recent Labs    10/25/22 0921  IRON 13*  TIBC 217*  FERRITIN 199   Lab Results  Component Value Date   INR 1.4 (H) 01/30/2022   INR 1.0 08/17/2019   Studies/Results: ECHOCARDIOGRAM COMPLETE  Result Date: 10/25/2022    ECHOCARDIOGRAM REPORT   Patient Name:   Natasha Long Date of Exam: 10/25/2022 Medical Rec #:  [id number]         Height:       68.0 in Accession #:    [id number]        Weight:       200.0 lb Date of Birth:  01-20-1996         BSA:          2.044 m Patient Age:    26 years          BP:           170/123 mmHg Patient Gender: F  HR:           108 bpm. Exam Location:  Inpatient Procedure: 2D Echo, Cardiac Doppler, Color Doppler and 3D Echo STAT ECHO Indications:    Pericardial effusion  History:        Patient has prior history of Echocardiogram examinations, most                 recent 08/25/2022. Mitral Valve Disease, Signs/Symptoms:Shortness                 of Breath; Risk Factors:Hypertension. Polysubstance abuse.                 HFrEF. ESRD.  Sonographer:    Clayton Lefort RDCS (AE) Referring Phys: [place]  1. Left ventricular ejection fraction, by estimation, is 40 to 45%. Left ventricular ejection fraction by 3D volume is 43 %. The left ventricle has mildly decreased function. The left ventricle demonstrates global hypokinesis. The left ventricular internal cavity size was mildly dilated. There is severe concentric left ventricular hypertrophy. Left ventricular diastolic parameters are consistent with Grade II diastolic dysfunction (pseudonormalization). Elevated left atrial pressure.  2. Right ventricular systolic function is normal. The right ventricular size is moderately enlarged. There is severely elevated pulmonary artery systolic pressure. The estimated right ventricular systolic pressure is 84.1  mmHg.  3. Left atrial size was mildly dilated.  4. Right atrial size was mildly dilated.  5. Moderate pericardial effusion. The pericardial effusion is circumferential. There is no evidence of cardiac tamponade.  6. The mitral valve is degenerative. Severe mitral valve regurgitation. The mean mitral valve gradient is 7.1 mmHg with average heart rate of 110 bpm. Moderate to severe mitral annular calcification.  7. The tricuspid valve is abnormal. 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  8. The aortic valve is tricuspid. There is mild calcification of the aortic valve. There is mild thickening of the aortic valve. Aortic valve regurgitation is mild to moderate. Mild to moderate aortic valve stenosis.  9. The inferior vena cava is dilated in size with <50% respiratory variability, suggesting right atrial pressure of 15 mmHg. Comparison(s): No significant change from prior study. Prior images reviewed side by side. FINDINGS  Left Ventricle: Left ventricular ejection fraction, by estimation, is 40 to 45%. Left ventricular ejection fraction by 3D volume is 43 %. The left ventricle has mildly decreased function. The left ventricle demonstrates global hypokinesis. The left ventricular internal cavity size was mildly dilated. There is severe concentric left ventricular hypertrophy. Left ventricular diastolic parameters are consistent with Grade II diastolic dysfunction (pseudonormalization). Elevated left atrial pressure. Right Ventricle: The right ventricular size is moderately enlarged. No increase in right ventricular wall thickness. Right ventricular systolic function is normal. There is severely elevated pulmonary artery systolic pressure. The tricuspid regurgitant velocity is 3.51 m/s, and with an assumed right atrial pressure of 15 mmHg, the estimated right ventricular systolic pressure is 66.0 mmHg. Left Atrium: Left atrial size was mildly dilated. Right Atrium: Right atrial size was mildly dilated. Pericardium: A  moderately sized pericardial effusion is present. The pericardial effusion is circumferential. There is no evidence of cardiac tamponade. Mitral Valve: The mitral valve is degenerative in appearance. Moderate to severe mitral annular calcification. Severe mitral valve regurgitation, with centrally-directed jet. The mean mitral valve gradient is 7.1 mmHg with average heart rate of 110 bpm. Tricuspid Valve: The tricuspid valve is abnormal. 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 Aortic Valve: The aortic valve is tricuspid. There is mild calcification of the aortic  valve. There is mild thickening of the aortic valve. Aortic valve regurgitation is mild to moderate. Mild to moderate aortic stenosis is present. Aortic valve mean gradient measures 26.0 mmHg. Aortic valve peak gradient measures 47.1 mmHg. Aortic valve area, by VTI measures 1.96 cm. Pulmonic Valve: The pulmonic valve was normal in structure. Pulmonic valve regurgitation is trivial. Aorta: The aortic root is normal in size and structure. Venous: The inferior vena cava is dilated in size with less than 50% respiratory variability, suggesting right atrial pressure of 15 mmHg. IAS/Shunts: No atrial level shunt detected by color flow Doppler. Additional Comments: There is pleural effusion in the left lateral region.  LEFT VENTRICLE PLAX 2D LVIDd:         5.80 cm LVIDs:         4.40 cm LV PW:         1.80 cm         3D Volume EF LV IVS:        1.60 cm         LV 3D EF:    Left LVOT diam:     2.10 cm                      ventricul LV SV:         99                           ar LV SV Index:   48                           ejection LVOT Area:     3.46 cm                     fraction                                             by 3D                                             volume is                                             43 %.                                 3D Volume EF:                                3D EF:        43 %                                LV  EDV:       291 ml  LV ESV:       167 ml                                LV SV:        124 ml RIGHT VENTRICLE             IVC RV Basal diam:  5.30 cm     IVC diam: 2.50 cm RV S prime:     17.70 cm/s TAPSE (M-mode): 1.9 cm LEFT ATRIUM           Index        RIGHT ATRIUM           Index LA diam:      3.40 cm 1.66 cm/m   RA Area:     20.40 cm LA Vol (A4C): 89.7 ml 43.89 ml/m  RA Volume:   55.40 ml  27.11 ml/m  AORTIC VALVE AV Area (Vmax):    1.93 cm AV Area (Vmean):   1.78 cm AV Area (VTI):     1.96 cm AV Vmax:           343.00 cm/s AV Vmean:          239.000 cm/s AV VTI:            0.504 m AV Peak Grad:      47.1 mmHg AV Mean Grad:      26.0 mmHg LVOT Vmax:         191.00 cm/s LVOT Vmean:        123.000 cm/s LVOT VTI:          0.285 m LVOT/AV VTI ratio: 0.57  AORTA Ao Root diam: 3.00 cm MITRAL VALVE              TRICUSPID VALVE MV Area (PHT): 2.51 cm   TR Peak grad:   49.3 mmHg MV Mean grad:  7.1 mmHg   TR Vmax:        351.00 cm/s MV Decel Time: 303 msec MR Peak grad: 122.8 mmHg  SHUNTS MR Mean grad: 83.0 mmHg   Systemic VTI:  0.29 m MR Vmax:      554.00 cm/s Systemic Diam: 2.10 cm MR Vmean:     438.0 cm/s Dani Gobble Croitoru MD Electronically signed by Sanda Klein MD Signature Date/Time: 10/25/2022/12:08:01 PM    Final    CT Angio Chest PE W and/or Wo Contrast  Result Date: 10/25/2022 CLINICAL DATA:  Rule out pulmonary embolus. EXAM: CT ANGIOGRAPHY CHEST WITH CONTRAST TECHNIQUE: Multidetector CT imaging of the chest was performed using the standard protocol during bolus administration of intravenous contrast. Multiplanar CT image reconstructions and MIPs were obtained to evaluate the vascular anatomy. RADIATION DOSE REDUCTION: This exam was performed according to the departmental dose-optimization program which includes automated exposure control, adjustment of the mA and/or kV according to patient size and/or use of iterative reconstruction technique. CONTRAST:  34mL OMNIPAQUE  IOHEXOL 350 MG/ML SOLN COMPARISON:  09/04/2022 FINDINGS: Cardiovascular: Cardiac enlargement is stable. Moderate to large pericardial effusion is again noted. Satisfactory opacification of the pulmonary arteries to the segmental level. No evidence of acute pulmonary embolism. Mediastinum/Nodes: Thyroid gland, trachea and esophagus are unremarkable. No adenopathy identified. Diffuse edema noted throughout the mediastinal fat. Lungs/Pleura: There is been interval resolution of previous left pleural effusion. Unchanged appearance moderate right pleural effusion. Scattered areas of subsegmental atelectasis are identified bilaterally. No airspace disease identified. Upper Abdomen: No acute  abnormality. Perihepatic ascites noted. Signs of previous cholecystectomy. Musculoskeletal: Marked diffuse edema is identified throughout the chest wall and abdominal wall. No acute or suspicious osseous findings. Review of the MIP images confirms the above findings. IMPRESSION: 1. No evidence for acute pulmonary embolism. 2. Stable cardiomegaly with moderate to large pericardial effusion. 3. Stable moderate right pleural effusion.Interval resolution of previous left pleural effusion. 4. Marked diffuse edema throughout the chest wall and abdominal wall compatible with anasarca. 5. Perihepatic ascites. Electronically Signed   By: Kerby Moors M.D.   On: 10/25/2022 08:44   DG Chest Port 1 View  Result Date: 10/25/2022 CLINICAL DATA:  Shortness of breath. EXAM: PORTABLE CHEST 1 VIEW COMPARISON:  October 24, 2022 FINDINGS: The cardiac silhouette is moderately enlarged and unchanged in size. Stable, moderate severity atelectasis and/or infiltrate is seen within the bilateral lung bases. There is stable prominence of the perihilar pulmonary vasculature. A small left pleural effusion is seen. A moderate sized right pleural effusion is also noted. This is decreased in size when compared to the prior study. No pneumothorax is  identified. The visualized skeletal structures are unremarkable. IMPRESSION: 1. Stable cardiomegaly and pulmonary vascular congestion. 2. Stable, moderate severity bibasilar atelectasis and/or infiltrate. 3. Moderate sized right-sided pleural effusion, decreased in size when compared to the prior exam. Electronically Signed   By: Virgina Norfolk M.D.   On: 10/25/2022 03:03   CT Lumbar Spine Wo Contrast  Result Date: 10/24/2022 CLINICAL DATA:  Trauma. Back pain. EXAM: CT LUMBAR SPINE WITHOUT CONTRAST TECHNIQUE: Multidetector CT imaging of the lumbar spine was performed without intravenous contrast administration. Multiplanar CT image reconstructions were also generated. RADIATION DOSE REDUCTION: This exam was performed according to the departmental dose-optimization program which includes automated exposure control, adjustment of the mA and/or kV according to patient size and/or use of iterative reconstruction technique. COMPARISON:  CT scan 08/31/2022 FINDINGS: Segmentation: There are five lumbar type vertebral bodies. The last full intervertebral disc space is labeled L5-S1. Alignment: Normal Vertebrae: No acute fracture. Paraspinal and other soft tissues: No significant paraspinal findings. Stable SI joint degenerative changes. Age advanced vascular calcifications. Moderate free pelvic fluid. Severe diffuse body wall edema. Disc levels: Stable advanced degenerative disc disease at L5-S1 with bulging annulus and osteophytic ridging but no significant spinal or foraminal stenosis in the lumbar spine. IMPRESSION: 1. Normal alignment of the lumbar vertebral bodies and no acute fracture. 2. Stable advanced degenerative disc disease at L5-S1 with bulging annulus and osteophytic ridging but no significant spinal or foraminal stenosis. 3. Age advanced vascular calcifications. 4. Moderate free pelvic fluid and severe diffuse body wall edema. Electronically Signed   By: Marijo Sanes M.D.   On: 10/24/2022 18:51    DG Chest Portable 1 View  Result Date: 10/24/2022 CLINICAL DATA:  Dyspnea after missing dialysis EXAM: PORTABLE CHEST 1 VIEW COMPARISON:  Radiograph 10/04/2022 FINDINGS: Interval increase in the now moderate right pleural effusion with similar small left pleural effusion. Similar cardiomegaly. Pulmonary vascular congestion has increased since 10/04/2022 with lower lung infiltrates which may represent edema or infection. No acute osseous abnormality. IMPRESSION: Constellation of findings favored to represent worsening CHF. Increased moderate right and similar small left pleural effusions. Electronically Signed   By: Placido Sou M.D.   On: 10/24/2022 16:25    Medications:   ARIPiprazole  10 mg Oral Daily   calcitRIOL  0.25 mcg Oral Daily   calcium acetate  667 mg Oral TID WC   Chlorhexidine Gluconate Cloth  6 each  Topical Q0600   darbepoetin (ARANESP) injection - DIALYSIS  200 mcg Subcutaneous Q Mon-1800   diltiazem  60 mg Oral Q6H   heparin  5,000 Units Subcutaneous Q8H   lidocaine  1 patch Transdermal Q24H   sodium chloride  1 spray Each Nare Once    Dialysis Orders: [facility] MWF, F180, BFR 400, edw 92kg, 2K, 2Ca, LUE AVF, Mircera 260mcg q 2 weeks Hep 5000  Assessment/Plan: 1. Volume overload - 2/2 non compliance with HD. CXR w/pulmonary vascular congestion, moderate pleural effusion decreased in size. UF 4L with HD today.  Continue to titrate down as tolerated.  2. Hypertensive Urgency - Continue home meds.  Worsened by volume overload.  Expect improvement w/HD.  3. ESRD - on HD MWF.  Non compliant as outpatient. HD today per regular schedule.  HD SW checking into outpatient plan now that patient is incarcerated.  4. Anemia of CKD- Hgb 9. Tsat 6%.  Start iron load. Aranesp ordered for today.  5. Secondary hyperparathyroidism - Ca in goal. Check phos. Continue binders and calcitriol.   6. Nutrition - Renal diet w/fluid restrictions.  7. Acute on chronic CHF - EF 40-45%.  Repeat ECHO ordered.  8. Severe MR - repeat ECHO ordered.   Jen Mow, PA-C [facility] 10/26/2022,11:19 AM  LOS: 1 day

## 2022-10-27 DIAGNOSIS — I160 Hypertensive urgency: Secondary | ICD-10-CM | POA: Diagnosis not present

## 2022-10-27 LAB — CBC
HCT: 25.5 % — ABNORMAL LOW (ref 36.0–46.0)
Hemoglobin: 8 g/dL — ABNORMAL LOW (ref 12.0–15.0)
MCH: 31 pg (ref 26.0–34.0)
MCHC: 31.4 g/dL (ref 30.0–36.0)
MCV: 98.8 fL (ref 80.0–100.0)
Platelets: 173 10*3/uL (ref 150–400)
RBC: 2.58 MIL/uL — ABNORMAL LOW (ref 3.87–5.11)
RDW: 19.7 % — ABNORMAL HIGH (ref 11.5–15.5)
WBC: 10.7 10*3/uL — ABNORMAL HIGH (ref 4.0–10.5)
nRBC: 0 % (ref 0.0–0.2)

## 2022-10-27 LAB — RENAL FUNCTION PANEL
Albumin: 2.2 g/dL — ABNORMAL LOW (ref 3.5–5.0)
Anion gap: 17 — ABNORMAL HIGH (ref 5–15)
BUN: 64 mg/dL — ABNORMAL HIGH (ref 6–20)
CO2: 23 mmol/L (ref 22–32)
Calcium: 8.9 mg/dL (ref 8.9–10.3)
Chloride: 94 mmol/L — ABNORMAL LOW (ref 98–111)
Creatinine, Ser: 9.53 mg/dL — ABNORMAL HIGH (ref 0.44–1.00)
GFR, Estimated: 5 mL/min — ABNORMAL LOW (ref >60)
Glucose, Bld: 93 mg/dL (ref 70–99)
Phosphorus: 7.7 mg/dL — ABNORMAL HIGH (ref 2.5–4.6)
Potassium: 4.5 mmol/L (ref 3.5–5.1)
Sodium: 134 mmol/L — ABNORMAL LOW (ref 135–145)

## 2022-10-27 LAB — HEPATITIS B SURFACE ANTIBODY, QUANTITATIVE: Hep B S AB Quant (Post): >1000 m[IU]/mL (ref >9.9)

## 2022-10-27 MED ORDER — DILTIAZEM HCL 60 MG PO TABS
90.0000 mg | ORAL_TABLET | Freq: Four times a day (QID) | ORAL | Status: DC
  Administered 2022-10-27 – 2022-10-29 (×6): 90 mg via ORAL
  Filled 2022-10-27 (×4): qty 2
  Filled 2022-10-27: qty 1
  Filled 2022-10-27: qty 2
  Filled 2022-10-27: qty 1
  Filled 2022-10-27: qty 2

## 2022-10-27 MED ORDER — CHLORHEXIDINE GLUCONATE CLOTH 2 % EX PADS: 6.0000 | MEDICATED_PAD | Freq: Every day | CUTANEOUS | Status: DC

## 2022-10-27 MED ORDER — OXYCODONE HCL 5 MG PO TABS
5.0000 mg | ORAL_TABLET | Freq: Four times a day (QID) | ORAL | Status: DC | PRN
  Administered 2022-10-27 – 2022-10-29 (×6): 5 mg via ORAL
  Filled 2022-10-27 (×6): qty 1

## 2022-10-27 NOTE — Progress Notes (Signed)
PROGRESS NOTE    Natasha Long  RJJ:[id number] DOB: [birth year] DOA: 10/24/2022 PCP: [name], MD    Brief Narrative:   Natasha Long is a 26 y.o. female with past medical history significant for borderline personality disorder, PTSD, ESRD on HD TTS, HTN, severe MR, chronic systolic congestive heart failure (LVEF 40-45%), chronic back pain, polysubstance abuse who presents to [facility] ED via EMS from [facility] jail with complaints of pleuritic chest pain, shortness of breath, productive cough, lower back and bilateral lower extremity pain.  Patient reports onset of symptoms roughly 1 week ago associated with chills.  Denies fever, no nausea/vomiting, no abdominal pain, no diarrhea.  Patient reported compliance with all of her medications but she has history of medical noncompliance in the past with multiple AMA discharges.  In the ED, BP 180/114, HR 104, RR 21, SpO2 97% 4 L nasal cannula.  WBC 10.7, hemoglobin 9.4, platelets 173.  Chest x-ray with moderate right pleural effusion, pulmonary vascular congestion consistent with pulmonary edema.  CTA chest negative for PE, stable cardiomegaly with moderate to large pericardial effusion, moderate right pleural effusion, marked diffuse edema throughout the chest wall and abdominal wall consistent with anasarca.  CT L-spine with no acute fracture, degenerative disc disease with bulging annulus at L5-S1 without significant spinal or foraminal stenosis.  Sodium 139, potassium 6.2, chloride 98, CO2 20, glucose 109, BUN 131, creatinine 16.54.  AST 27, ALT 28, total bilirubin 0.6.  High sensitive troponin 682.  BNP greater than 4500.  Nephrology was consulted.  TRH consulted for admission for further evaluation and management of decompensated CHF exacerbation due to volume overload and medical noncompliance.  Assessment & Plan:   Acute respiratory failure with hypoxia Acute on chronic systolic/diastolic congestive heart failure exacerbation,  decompensated Patient presenting from jail with progressive shortness of breath, pleuritic chest pain and cough.  Patient with elevated BNP greater than 4500 and chest x-ray findings of pulmonary edema.  CT angiogram chest negative for pulmonary embolism.  Also with moderate to large pleural effusion.  Etiology likely secondary to medical noncompliance given her multiple hospitalizations and AMA discharges, now patient resident of [facility] jail. -- Nephrology following, appreciate assistance -- 1200 mL fluid restriction -- Strict I's and O's and daily weights -- Continue supplemental oxygen, maintain SpO2 greater than 92%; currently on 3 L per Venturi mask with SpO2 100%; order to wean to room air  Pericardial effusion Patient with moderate to large pericardial effusion noted on CT chest.  TTE with LVEF 40-45%, LV mildly decreased function with global hypokinesis, biatrial enlargement, severe concentric LVH, grade 2 diastolic dysfunction, moderate pericardial effusion that is circumferential with no evidence of cardiac tamponade, severe MR IVC dilated, mild-moderate aortic valve stenosis.  Patient with a known history of pericardial effusion and was discussed with cardiology, Dr. Domenic Polite who recommended echo and no formal consult if no tamponade noted. -- Volume management with HD  Hypertensive urgency -- Increase Cardizem to 90 mg p.o. every 6 hours -- Hydralazine 25 mg p.o. q6h PRN SBP >165 or DBP >110 -- Will avoid beta-blockers in setting of history of cocaine abuse  Rhinovirus Respiratory viral panel positive for rhinovirus. -- Droplet precautions, supportive care, supplemental oxygen to maintain SpO2 greater than 92%  Anemia of chronic renal disease -- S/p IV iron 11/27  Borderline personality disorder PTSD Seen by psychiatry during previous admission. -- Abilify 10 mg p.o. daily -- Hydroxyzine 25 mg TID PRN anxiety  Polysubstance abuse Patient with history of  cocaine,  tobacco, marijuana abuse.  Counseled on need for complete cessation.  Morbid obesity Discussed with patient needs for aggressive lifestyle changes/weight loss as this complicates all facets of care.  Outpatient follow-up with PCP.      DVT prophylaxis: heparin injection 5,000 Units Start: 10/25/22 1400    Code Status: Full Code Family Communication: No family present at bedside this morning, updated Sheriff's deputy present in room this morning  Disposition Plan:  Level of care: Progressive Status is: Inpatient Remains inpatient appropriate because: Needs further hemodialysis and titration off of supplemental oxygen; needs to have outpatient HD set up before can return to jail    Consultants:  Nephrology  Procedures:  None  Antimicrobials:  Vancomycin 11/25 - 11/25   Subjective: Patient seen examined bedside, lying in bed.  Remains in ED holding area.  Sheriff's deputy present.  HD yesterday.  Sleeping but arousable.  No other specific complaints or concerns at this time.  Denies headache, no visual changes, no chest pain, no palpitations, no abdominal pain, no focal weakness, no fever/chills/night sweats, no nausea/vomiting/diarrhea.  No acute events overnight per nursing staff.  Objective: Vitals:   10/27/22 0700 10/27/22 0730 10/27/22 0800 10/27/22 1103  BP: (!) 157/95 (!) 152/91 (!) 146/76   Pulse: 93 90 88   Resp:   16   Temp:    98.6 F (37 C)  TempSrc:    Oral  SpO2: 96% 97% (!) 89%     Intake/Output Summary (Last 24 hours) at 10/27/2022 1134 Last data filed at 10/26/2022 1353 Gross per 24 hour  Intake --  Output 3400 ml  Net -3400 ml   There were no vitals filed for this visit.  Examination:  Physical Exam: GEN: NAD, alert and oriented x 3, medically ill in appearance, appears older than stated age 39: NCAT, PERRL, EOMI, sclera clear, MMM PULM: Breath sounds diminished bilateral bases with crackles, no wheezing, normal respiratory effort without  accessory muscle use, on 3 L Venturi mask with SpO2 100% at rest CV: RRR w +SEM, no gallop/rub GI: abd soft, NTND, NABS, no R/G/M MSK: 2+ peripheral edema, moves all extremities independently NEURO: CN II-XII intact, no focal deficits, sensation to light touch intact PSYCH: Anxious mood Integumentary: dry/intact, no rashes or wounds    Data Reviewed: I have personally reviewed following labs and imaging studies  CBC: Recent Labs  Lab 10/21/22 0914 10/24/22 1513 10/25/22 0527 10/26/22 0404 10/27/22 0426  WBC 8.6 10.7*  --  12.6* 10.7*  NEUTROABS 6.5 9.1*  --   --   --   HGB 9.7* 9.4* 8.8* 9.0* 8.0*  HCT 30.8* 29.8* 26.0* 29.6* 25.5*  MCV 100.3* 99.3  --  100.3* 98.8  PLT 238 173  --  170 967   Basic Metabolic Panel: Recent Labs  Lab 10/21/22 0914 10/24/22 1513 10/25/22 0527 10/26/22 0404 10/27/22 0426  NA 141 139 134* 135 134*  K 6.7* 6.2* 4.9 5.6* 4.5  CL 102 98  --  96* 94*  CO2 16* 20*  --  21* 23  GLUCOSE 112* 109*  --  106* 93  BUN 155* 131*  --  99* 64*  CREATININE 19.06* 16.54*  --  13.00* 9.53*  CALCIUM 9.3 8.5*  --  8.9 8.9  PHOS  --   --   --   --  7.7*   GFR: Estimated Creatinine Clearance: 10.4 mL/min (A) (by C-G formula based on SCr of 9.53 mg/dL (H)). Liver Function Tests: Recent Labs  Lab 10/21/22 0914 10/24/22 1513 10/26/22 0404 10/27/22 0426  AST 62* 27 23  --   ALT 43 28 21  --   ALKPHOS 77 54 50  --   BILITOT 0.6 0.6 0.6  --   PROT 7.4 6.9 6.6  --   ALBUMIN 3.4* 2.7* 2.4* 2.2*   No results for input(s): "LIPASE", "AMYLASE" in the last 168 hours. No results for input(s): "AMMONIA" in the last 168 hours. Coagulation Profile: No results for input(s): "INR", "PROTIME" in the last 168 hours. Cardiac Enzymes: No results for input(s): "CKTOTAL", "CKMB", "CKMBINDEX", "TROPONINI" in the last 168 hours. BNP (last 3 results) No results for input(s): "PROBNP" in the last 8760 hours. HbA1C: No results for input(s): "HGBA1C" in the last 72  hours. CBG: No results for input(s): "GLUCAP" in the last 168 hours. Lipid Profile: No results for input(s): "CHOL", "HDL", "LDLCALC", "TRIG", "CHOLHDL", "LDLDIRECT" in the last 72 hours. Thyroid Function Tests: No results for input(s): "TSH", "T4TOTAL", "FREET4", "T3FREE", "THYROIDAB" in the last 72 hours. Anemia Panel: Recent Labs    10/25/22 0921  FERRITIN 199  TIBC 217*  IRON 13*   Sepsis Labs: Recent Labs  Lab 10/25/22 0553 10/25/22 0607  PROCALCITON 2.02  --   LATICACIDVEN 0.4* 0.8    Recent Results (from the past 240 hour(s))  Blood culture (routine x 2)     Status: None (Preliminary result)   Collection Time: 10/25/22  5:53 AM   Specimen: BLOOD  Result Value Ref Range Status   Specimen Description BLOOD SITE NOT SPECIFIED  Final   Special Requests   Final    BOTTLES DRAWN AEROBIC AND ANAEROBIC Blood Culture adequate volume   Culture   Final    NO GROWTH 2 DAYS Performed at [facility], [place] [address], [place], [place] [address]    Report Status PENDING  Incomplete  Blood culture (routine x 2)     Status: None (Preliminary result)   Collection Time: 10/25/22 11:11 AM   Specimen: BLOOD  Result Value Ref Range Status   Specimen Description BLOOD SITE NOT SPECIFIED  Final   Special Requests   Final    BOTTLES DRAWN AEROBIC AND ANAEROBIC Blood Culture adequate volume   Culture   Final    NO GROWTH 2 DAYS Performed at [facility], [address] [place], [place] [address]    Report Status PENDING  Incomplete  Respiratory (~20 pathogens) panel by PCR     Status: Abnormal   Collection Time: 10/25/22  4:57 PM   Specimen: Nasopharyngeal Swab; Respiratory  Result Value Ref Range Status   Adenovirus NOT DETECTED NOT DETECTED Final   Coronavirus 229E NOT DETECTED NOT DETECTED Final    Comment: (NOTE) The Coronavirus on the Respiratory Panel, DOES NOT test for the novel  Coronavirus (2019 nCoV)    Coronavirus HKU1 NOT DETECTED NOT DETECTED Final    Coronavirus NL63 NOT DETECTED NOT DETECTED Final   Coronavirus OC43 NOT DETECTED NOT DETECTED Final   Metapneumovirus NOT DETECTED NOT DETECTED Final   Rhinovirus / Enterovirus DETECTED (A) NOT DETECTED Final   Influenza A NOT DETECTED NOT DETECTED Final   Influenza B NOT DETECTED NOT DETECTED Final   Parainfluenza Virus 1 NOT DETECTED NOT DETECTED Final   Parainfluenza Virus 2 NOT DETECTED NOT DETECTED Final   Parainfluenza Virus 3 NOT DETECTED NOT DETECTED Final   Parainfluenza Virus 4 NOT DETECTED NOT DETECTED Final   Respiratory Syncytial Virus NOT DETECTED NOT  DETECTED Final   Bordetella pertussis NOT DETECTED NOT DETECTED Final   Bordetella Parapertussis NOT DETECTED NOT DETECTED Final   Chlamydophila pneumoniae NOT DETECTED NOT DETECTED Final   Mycoplasma pneumoniae NOT DETECTED NOT DETECTED Final    Comment: Performed at [facility], [place] [address], [place], [place] [address]         Radiology Studies: No results found.      Scheduled Meds:  ARIPiprazole  10 mg Oral Daily   calcitRIOL  0.25 mcg Oral Daily   calcium acetate  667 mg Oral TID WC   Chlorhexidine Gluconate Cloth  6 each Topical Q0600   darbepoetin (ARANESP) injection - DIALYSIS  200 mcg Subcutaneous Q Mon-1800   diltiazem  90 mg Oral Q6H   heparin  5,000 Units Subcutaneous Q8H   lidocaine  1 patch Transdermal Q24H   sodium chloride  1 spray Each Nare Once   Continuous Infusions:  ferric gluconate (FERRLECIT) IVPB Stopped (10/26/22 1458)     LOS: 2 days    Time spent: 52 minutes spent on chart review, discussion with nursing staff, consultants, updating family and interview/physical exam; more than 50% of that time was spent in counseling and/or coordination of care.    Dallyn Bergland J [place] DO [facility] Available via Epic secure chat 7am-7pm After these hours, please refer to coverage provider listed on [email or web address] 10/27/2022, 11:34 AM

## 2022-10-27 NOTE — Progress Notes (Addendum)
[facility] clinic manager to discuss pt's case. Awaiting a return call.   Melven Sartorius Renal Navigator [phone]  Addendum at 12:50 pm: Advised by [employer] staff that pt has been financially cleared to receive HD while in custody. [facility] at [facility] MWF 11:45 chair time. Pt will need to arrive at 11:30 and jail staff will need to park on the side of clinic. Case discussed with clinic manager at [facility] who provided the above details. Advised by [employer] staff to f/u with [place] with [facility] with pt's schedule for HD once d/c from hospital. Spoke to [place] [phone]) via phone. Provided above details regarding pt's HD at d/c. Deon requests that she be contacted once d/c date is known/confirmed so she can arrange transportation to/from HD while pt is in custody. Navigator will provide info to [place] once known. Update provided to treatment team via secure chat. Will need to update clinic manager of d/c date once known as well. Update provided to [name] staff that are involved from a financial aspect.   Addendum at 1:42 pm: Pt may be stable for d/c tomorrow per providers. Contacted Deon with [facility] to make her aware of this info and that pt will need transportation to HD clinic on Friday. Update provided to clinic manager as well. Inquired of Deon if pt could be transported to HD clinic tomorrow morning if pt d/c from hospital early tomorrow. Deon advised navigator that transportation would not be able to be started to HD clinic until Friday.
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
## 2022-10-27 NOTE — Progress Notes (Signed)
Patient's current disposition is for her to return to [facility] once cleared by outpatient HD clinic.  Madilyn Fireman, MSW, LCSW Transitions of Care  Clinical Social Worker II [phone]

## 2022-10-27 NOTE — ED Notes (Signed)
Pt does make "some" urine, reports to nurse that collection was missed when she urinated at 6pm and that she does not need to go.

## 2022-10-27 NOTE — Plan of Care (Signed)
   Problem: Clinical Measurements: Goal: Ability to maintain clinical measurements within normal limits will improve Outcome: Progressing   Problem: Clinical Measurements: Goal: Cardiovascular complication will be avoided Outcome: Progressing   Problem: Clinical Measurements: Goal: Respiratory complications will improve Outcome: Progressing   Problem: Activity: Goal: Risk for activity intolerance will decrease Outcome: Progressing   Problem: Coping: Goal: Level of anxiety will decrease Outcome: Progressing   Problem: Pain Managment: Goal: General experience of comfort will improve Outcome: Progressing   Problem: Safety: Goal: Ability to remain free from injury will improve Outcome: Progressing

## 2022-10-27 NOTE — Progress Notes (Signed)
Pt offered heparin for DVT prophylaxis. Pt stated she would only accept heparin if she got to hold needle. Pt informed she cannot have a needle/syringe in her possession and that RN has to administer medication. Pt refused heparin.  Pt is an inmate of [facility]. Engineer, structural in room at all times supervising.

## 2022-10-27 NOTE — ED Notes (Signed)
Pt ambulated independently and though she complained of a lot of pain, she maintained a sat of 96% on RA the whole time.

## 2022-10-27 NOTE — Progress Notes (Addendum)
[facility] Progress Note   Subjective:   Patient seen and examined in room.  Reports breathing better.  Asking for increased dose of pain medications. Denies CP, abdominal pain and n/v/d.   Objective Vitals:   10/27/22 1115 10/27/22 1130 10/27/22 1200 10/27/22 1230  BP:  130/82 (!) 146/86 (!) 181/112  Pulse: 94 91  60  Resp:    16  Temp:    98.9 F (37.2 C)  TempSrc:      SpO2: 97% 100%  100%   Physical Exam General:ill appearing female in NAD Heart:RRR Lungs:breath sounds decreased, no crackles, nml WOB on RA Abdomen:soft, NTND Extremities: 1+ LE edema Dialysis Access: LU AVF +b/t   There were no vitals filed for this visit.  Intake/Output Summary (Last 24 hours) at 10/27/2022 1315 Last data filed at 10/26/2022 1353 Gross per 24 hour  Intake --  Output 3400 ml  Net -3400 ml    Additional Objective Labs: Basic Metabolic Panel: Recent Labs  Lab 10/24/22 1513 10/25/22 0527 10/26/22 0404 10/27/22 0426  NA 139 134* 135 134*  K 6.2* 4.9 5.6* 4.5  CL 98  --  96* 94*  CO2 20*  --  21* 23  GLUCOSE 109*  --  106* 93  BUN 131*  --  99* 64*  CREATININE 16.54*  --  13.00* 9.53*  CALCIUM 8.5*  --  8.9 8.9  PHOS  --   --   --  7.7*   Liver Function Tests: Recent Labs  Lab 10/21/22 0914 10/24/22 1513 10/26/22 0404 10/27/22 0426  AST 62* 27 23  --   ALT 43 28 21  --   ALKPHOS 77 54 50  --   BILITOT 0.6 0.6 0.6  --   PROT 7.4 6.9 6.6  --   ALBUMIN 3.4* 2.7* 2.4* 2.2*   CBC: Recent Labs  Lab 10/21/22 0914 10/24/22 1513 10/25/22 0527 10/26/22 0404 10/27/22 0426  WBC 8.6 10.7*  --  12.6* 10.7*  NEUTROABS 6.5 9.1*  --   --   --   HGB 9.7* 9.4* 8.8* 9.0* 8.0*  HCT 30.8* 29.8* 26.0* 29.6* 25.5*  MCV 100.3* 99.3  --  100.3* 98.8  PLT 238 173  --  170 173   Blood Culture    Component Value Date/Time   SDES BLOOD SITE NOT SPECIFIED 10/25/2022 1111   SPECREQUEST  10/25/2022 1111    BOTTLES DRAWN AEROBIC AND ANAEROBIC Blood Culture adequate  volume   CULT  10/25/2022 1111    NO GROWTH 2 DAYS Performed at [facility], [place] [address] [place], [place] [address]    REPTSTATUS PENDING 10/25/2022 1111   Iron Studies:  Recent Labs    10/25/22 0921  IRON 13*  TIBC 217*  FERRITIN 199   Lab Results  Component Value Date   INR 1.4 (H) 01/30/2022   INR 1.0 08/17/2019   Studies/Results: No results found.  Medications:  ferric gluconate (FERRLECIT) IVPB Stopped (10/26/22 1458)    ARIPiprazole  10 mg Oral Daily   calcitRIOL  0.25 mcg Oral Daily   calcium acetate  667 mg Oral TID WC   Chlorhexidine Gluconate Cloth  6 each Topical Q0600   darbepoetin (ARANESP) injection - DIALYSIS  200 mcg Subcutaneous Q Mon-1800   diltiazem  90 mg Oral Q6H   heparin  5,000 Units Subcutaneous Q8H   lidocaine  1 patch Transdermal Q24H   sodium chloride  1 spray Each Nare Once  Dialysis Orders: [facility] MWF, F180, BFR 400, edw 92kg, 2K, 2Ca, LUE AVF, Mircera 210mcg q 2 weeks Hep 5000   Assessment/Plan: 1. Volume overload - 2/2 non compliance with HD. CXR w/pulmonary vascular congestion, moderate pleural effusion decreased in size. UF 4L with HD yesterday. Plan for extra HD today to help improve volume status.  2. Hypertensive Urgency - Improving. Continue home meds.  Worsened by volume overload.  3. ESRD - on HD MWF.  Non compliant as outpatient. HD off schedule for excess volume.  [facility] HD [facility] now [facility] with jail but they are unable to transport her until Friday.  Will reassess in the AM whether she requires additional treatment tomorrow. 4. Anemia of CKD- Hgb 8.0. Tsat 6%.  Start iron load. Aranesp 24mcg qwk given yesterday.  5. Secondary hyperparathyroidism - Ca in goal. Phos elevated. Continue binders and calcitriol.   6. Nutrition - Renal diet w/fluid restrictions.  7. Acute on chronic CHF - EF 40-45%. Repeat ECHO ordered.  8. Severe MR - repeat ECHO ordered.   Jen Mow, PA-C [facility] 10/27/2022,1:15 PM  LOS: 2 days

## 2022-10-27 NOTE — Care Plan (Signed)
Patient seen and examined at start of dialysis.  Successful cannulation of AVF.  Reports pain feeling like her access was infiltrated but no infiltration present. Requesting to have needles removed and refusing to complete dialysis today.  Orders written for HD tomorrow with expert cannulator.   Jen Mow, PA-C [employer]

## 2022-10-27 NOTE — ED Notes (Signed)
Report given to Dialysis.  PT will go to dialysis before going to the floor.

## 2022-10-28 ENCOUNTER — Inpatient Hospital Stay (HOSPITAL_COMMUNITY)

## 2022-10-28 DIAGNOSIS — N186 End stage renal disease: Secondary | ICD-10-CM | POA: Diagnosis not present

## 2022-10-28 DIAGNOSIS — I3139 Other pericardial effusion (noninflammatory): Secondary | ICD-10-CM | POA: Diagnosis not present

## 2022-10-28 DIAGNOSIS — I160 Hypertensive urgency: Secondary | ICD-10-CM | POA: Diagnosis not present

## 2022-10-28 DIAGNOSIS — J810 Acute pulmonary edema: Secondary | ICD-10-CM

## 2022-10-28 DIAGNOSIS — E8770 Fluid overload, unspecified: Secondary | ICD-10-CM

## 2022-10-28 DIAGNOSIS — N289 Disorder of kidney and ureter, unspecified: Secondary | ICD-10-CM

## 2022-10-28 DIAGNOSIS — M79602 Pain in left arm: Secondary | ICD-10-CM

## 2022-10-28 LAB — RENAL FUNCTION PANEL
Albumin: 2.2 g/dL — ABNORMAL LOW (ref 3.5–5.0)
Anion gap: 16 — ABNORMAL HIGH (ref 5–15)
BUN: 77 mg/dL — ABNORMAL HIGH (ref 6–20)
CO2: 22 mmol/L (ref 22–32)
Calcium: 9.1 mg/dL (ref 8.9–10.3)
Chloride: 95 mmol/L — ABNORMAL LOW (ref 98–111)
Creatinine, Ser: 10.64 mg/dL — ABNORMAL HIGH (ref 0.44–1.00)
GFR, Estimated: 5 mL/min — ABNORMAL LOW (ref >60)
Glucose, Bld: 97 mg/dL (ref 70–99)
Phosphorus: 8.4 mg/dL — ABNORMAL HIGH (ref 2.5–4.6)
Potassium: 4.6 mmol/L (ref 3.5–5.1)
Sodium: 133 mmol/L — ABNORMAL LOW (ref 135–145)

## 2022-10-28 LAB — CBC
HCT: 27.8 % — ABNORMAL LOW (ref 36.0–46.0)
Hemoglobin: 8.4 g/dL — ABNORMAL LOW (ref 12.0–15.0)
MCH: 29.9 pg (ref 26.0–34.0)
MCHC: 30.2 g/dL (ref 30.0–36.0)
MCV: 98.9 fL (ref 80.0–100.0)
Platelets: 223 10*3/uL (ref 150–400)
RBC: 2.81 MIL/uL — ABNORMAL LOW (ref 3.87–5.11)
RDW: 19.5 % — ABNORMAL HIGH (ref 11.5–15.5)
WBC: 9.3 10*3/uL (ref 4.0–10.5)
nRBC: 0 % (ref 0.0–0.2)

## 2022-10-28 MED ORDER — PENTAFLUOROPROP-TETRAFLUOROETH EX AERO: 1.0000 | INHALATION_SPRAY | CUTANEOUS | Status: DC | PRN

## 2022-10-28 MED ORDER — HEPARIN SODIUM (PORCINE) 1000 UNIT/ML DIALYSIS
3000.0000 [IU] | INTRAMUSCULAR | Status: DC | PRN
  Administered 2022-10-28: 3000 [IU] via INTRAVENOUS_CENTRAL
  Filled 2022-10-28: qty 3

## 2022-10-28 MED ORDER — LIDOCAINE HCL (PF) 1 % IJ SOLN: 5.0000 mL | INTRAMUSCULAR | Status: DC | PRN

## 2022-10-28 MED ORDER — LIDOCAINE-PRILOCAINE 2.5-2.5 % EX CREA: 1.0000 | TOPICAL_CREAM | CUTANEOUS | Status: DC | PRN

## 2022-10-28 NOTE — Progress Notes (Signed)
Left duplex dialysis access study completed.   Please see CV Proc for preliminary results.   Darlin Coco, RDMS, RVT

## 2022-10-28 NOTE — Progress Notes (Signed)
[facility] PROGRESS NOTE    Progress Note  Natasha Long  TKW:[id number] DOB: November 15, 1996 DOA: 10/24/2022 PCP: [name], MD     Brief Narrative:   Natasha Long is an 26 y.o. female past medical history significant for borderline personality disorder, PTSD, end-stage renal disease severe mitral regurgitation, chronic systolic heart failure with an EF of 40%, polysubstance abuse comes in via EMS from [facility] jail complaining of pleuritic chest pain and shortness of breath along with productive cough and bilateral lower extremity edema started about 1 week prior to admission.  She relates noncompliance with her medications in the ED was requiring 4 L of oxygen, with a white count of 10 chest x-ray showed right pleural effusion pulmonary edema.  CTA negative for PE moderate to large pericardial effusion, with moderate right pleural effusion chest wall and abdominal edema consistent with anasarca.  Neurology was consulted   Assessment/Plan:   Acute respiratory failure with hypoxia/hypervolemia associated with renal insufficiency/Acute pulmonary edema [facility]): Due to noncompliance with her medication and her dialysis treatment. Nephrology has been consulted she has been fluid restricted. Continue strict I's and O's. She status post HD yesterday she is negative about 4 L from her HD yesterday. She has been weaned to room air.  Still requiring 4 L of oxygen to keep saturations greater than 90%.  Moderate pericardial effusion: 2D echo showed decrease global hypokinesia with an EF of 92% grade 2 diastolic heart failure moderate pericardial effusion without evidence of tamponade.  Severe MR. The previous physician discussed with the cardiologist on-call and no formal consult if no tamponade noted on 2D echo which was done on 10/25/2022 showed no evidence of tamponade, with no significant change from prior study.  Hypertensive urgency She is currently on Cardizem and  hydralazine. Her blood pressure is slowly improving hopefully will continue to improve as she continues to get dialyzed.  Rhinovirus infection: Positive continue droplet precaution satting greater 90% on room air.  Anemia of chronic renal disease: Status post IV iron 10/26/2022 by renal.  PTSD/personality borderline disorder: Continue Abilify and hydroxyzine as needed.  Polysubstance abuse: Counseling, her drug of choice is cocaine tobacco and marijuana.   Stage II sacral decubitus ulcer present on admission RN Pressure Injury Documentation: Pressure Injury 02/16/22 Buttocks Right;Mid Stage 2 -  Partial thickness loss of dermis presenting as a shallow open injury with a red, pink wound bed without slough. pink area white in the center 1cm by 0.5 cm (Active)  02/16/22 1142  Location: Buttocks  Location Orientation: Right;Mid  Staging: Stage 2 -  Partial thickness loss of dermis presenting as a shallow open injury with a red, pink wound bed without slough.  Wound Description (Comments): pink area white in the center 1cm by 0.5 cm  Present on Admission: No    Estimated body mass index is 19.78 kg/m as calculated from the following:   Height as of 10/23/22: 5\' 8"  (1.727 m).   Weight as of this encounter: 59 kg.   DVT prophylaxis: lovenox Family Communication:none Status is: Inpatient Remains inpatient appropriate because: Respiratory failure with hypoxia due to missed dialysis    Code Status:     Code Status Orders  (From admission, onward)           Start     Ordered   10/25/22 1243  Full code  Continuous        10/25/22 1242           Code Status History  Date Active Date Inactive Code Status Order ID Comments User Context   10/04/2022 1452 10/08/2022 1720 Full Code [id number]  Lequita Halt, MD ED   09/29/2022 1407 10/02/2022 1735 Full Code [id number]  Neena Rhymes, MD ED   08/31/2022 0829 09/02/2022 1741 Full Code [id number]  Karmen Bongo, MD ED    08/24/2022 0820 08/29/2022 1421 Full Code [id number]  Norval Morton, MD ED   06/30/2022 2135 07/03/2022 1141 Full Code [id number]  Kayleen Memos, DO ED   06/26/2022 2302 06/29/2022 1907 Full Code [id number]  Orene Desanctis, DO ED   05/23/2022 0435 05/27/2022 2112 Full Code [id number]  Delora Fuel, MD ED   05/15/2022 1228 05/16/2022 2311 Full Code [id number]  Karmen Bongo, MD ED   05/03/2022 0417 05/07/2022 1640 Full Code [id number]  Kayleen Memos, DO ED   01/29/2022 1837 02/16/2022 2048 Full Code [id number]  Virl Axe, MD ED   12/23/2021 1701 12/26/2021 1909 Full Code [id number]  Karmen Bongo, MD Inpatient   08/01/2021 1217 08/04/2021 0017 Full Code [id number]  Tacy Learn, PA-C ED   07/27/2021 0704 07/28/2021 1055 Full Code [id number]  Anselmo Pickler, PA-C ED   01/15/2021 1744 01/17/2021 1458 Full Code [id number]  Donney Dice, DO ED   01/15/2021 1208 01/15/2021 1744 Full Code [id number]  Domenic Moras, PA-C ED   08/14/2019 2148 08/20/2019 1841 Full Code [id number]  Etta Quill, DO ED   07/30/2019 1544 08/01/2019 1223 Full Code [id number]  Daleen Bo, MD ED   02/08/2019 1718 02/14/2019 1104 Full Code [id number]  Ethelene Hal, NP Inpatient   02/08/2019 0229 02/08/2019 1710 Full Code [id number]  Ward, Delice Bison, DO ED   01/13/2019 0140 01/16/2019 1818 Full Code [id number]  Laverle Hobby, PA-C Inpatient   01/12/2019 1933 01/13/2019 0042 Full Code [id number]  Albesa Seen, PA-C ED   11/15/2018 0311 11/18/2018 2250 Full Code [id number]  Rozetta Nunnery, NP Inpatient   08/15/2017 0327 08/16/2017 1245 Full Code [id number]  Street, [place], [place] ED   04/09/2017 0051 04/09/2017 1607 Full Code [id number]  Dalia Heading, PA-C ED         IV Access:   Peripheral IV   Procedures and diagnostic studies:   No results found.   Medical Consultants:   None.   Subjective:    Natasha Long relates her breathing is about the same as yesterday  Objective:    Vitals:   10/27/22 1649 10/27/22 2009  10/28/22 0012 10/28/22 0409  BP: (!) 145/88 (!) 177/116 (!) 153/99 (!) 144/90  Pulse: 87 95 83 87  Resp:  18 18 18   Temp: 98.3 F (36.8 C) 98.6 F (37 C) 99.3 F (37.4 C) 98.2 F (36.8 C)  TempSrc: Oral Oral Oral Oral  SpO2: 95% 96% 96% 99%  Weight:    59 kg   SpO2: 99 % O2 Flow Rate (L/min): 2 L/min   Intake/Output Summary (Last 24 hours) at 10/28/2022 0948 Last data filed at 10/28/2022 0500 Gross per 24 hour  Intake 810.04 ml  Output --  Net 810.04 ml   Filed Weights   10/28/22 0409  Weight: 59 kg    Exam: General exam: In no acute distress. Respiratory system: Good air movement and clear to auscultation. Cardiovascular system: S1 & S2 heard, RRR.  Positive JVD Gastrointestinal system: Abdomen is nondistended, soft and nontender.  Extremities: Lower extremity edema Skin: No rashes, lesions or ulcers Psychiatry: Judgement and insight appear normal. Mood & affect appropriate.  Data Reviewed:    Labs: Basic Metabolic Panel: Recent Labs  Lab 10/24/22 1513 10/25/22 0527 10/26/22 0404 10/27/22 0426 10/28/22 0432  NA 139 134* 135 134* 133*  K 6.2* 4.9 5.6* 4.5 4.6  CL 98  --  96* 94* 95*  CO2 20*  --  21* 23 22  GLUCOSE 109*  --  106* 93 97  BUN 131*  --  99* 64* 77*  CREATININE 16.54*  --  13.00* 9.53* 10.64*  CALCIUM 8.5*  --  8.9 8.9 9.1  PHOS  --   --   --  7.7* 8.4*   GFR Estimated Creatinine Clearance: 7.3 mL/min (A) (by C-G formula based on SCr of 10.64 mg/dL (H)). Liver Function Tests: Recent Labs  Lab 10/24/22 1513 10/26/22 0404 10/27/22 0426 10/28/22 0432  AST 27 23  --   --   ALT 28 21  --   --   ALKPHOS 54 50  --   --   BILITOT 0.6 0.6  --   --   PROT 6.9 6.6  --   --   ALBUMIN 2.7* 2.4* 2.2* 2.2*   No results for input(s): "LIPASE", "AMYLASE" in the last 168 hours. No results for input(s): "AMMONIA" in the last 168 hours. Coagulation profile No results for input(s): "INR", "PROTIME" in the last 168 hours. COVID-19 Labs  No  results for input(s): "DDIMER", "FERRITIN", "LDH", "CRP" in the last 72 hours.  Lab Results  Component Value Date   SARSCOV2NAA NEGATIVE 01/30/2022   SARSCOV2NAA NEGATIVE 12/23/2021   SARSCOV2NAA NEGATIVE 08/01/2021   [place] NEGATIVE 07/27/2021    CBC: Recent Labs  Lab 10/24/22 1513 10/25/22 0527 10/26/22 0404 10/27/22 0426 10/28/22 0432  WBC 10.7*  --  12.6* 10.7* 9.3  NEUTROABS 9.1*  --   --   --   --   HGB 9.4* 8.8* 9.0* 8.0* 8.4*  HCT 29.8* 26.0* 29.6* 25.5* 27.8*  MCV 99.3  --  100.3* 98.8 98.9  PLT 173  --  170 173 223   Cardiac Enzymes: No results for input(s): "CKTOTAL", "CKMB", "CKMBINDEX", "TROPONINI" in the last 168 hours. BNP (last 3 results) No results for input(s): "PROBNP" in the last 8760 hours. CBG: No results for input(s): "GLUCAP" in the last 168 hours. D-Dimer: No results for input(s): "DDIMER" in the last 72 hours. Hgb A1c: No results for input(s): "HGBA1C" in the last 72 hours. Lipid Profile: No results for input(s): "CHOL", "HDL", "LDLCALC", "TRIG", "CHOLHDL", "LDLDIRECT" in the last 72 hours. Thyroid function studies: No results for input(s): "TSH", "T4TOTAL", "T3FREE", "THYROIDAB" in the last 72 hours.  Invalid input(s): "FREET3" Anemia work up: No results for input(s): "VITAMINB12", "FOLATE", "FERRITIN", "TIBC", "IRON", "RETICCTPCT" in the last 72 hours. Sepsis Labs: Recent Labs  Lab 10/24/22 1513 10/25/22 0553 10/25/22 0607 10/26/22 0404 10/27/22 0426 10/28/22 0432  PROCALCITON  --  2.02  --   --   --   --   WBC 10.7*  --   --  12.6* 10.7* 9.3  LATICACIDVEN  --  0.4* 0.8  --   --   --    Microbiology Recent Results (from the past 240 hour(s))  Blood culture (routine x 2)     Status: None (Preliminary result)   Collection Time: 10/25/22  5:53 AM   Specimen: BLOOD  Result Value Ref Range Status   Specimen Description BLOOD SITE NOT SPECIFIED  Final   Special Requests   Final    BOTTLES DRAWN AEROBIC AND ANAEROBIC Blood  Culture adequate volume   Culture   Final    NO GROWTH 3 DAYS Performed at [facility], [place] [address], [place], [place] [address]    Report Status PENDING  Incomplete  Blood culture (routine x 2)     Status: None (Preliminary result)   Collection Time: 10/25/22 11:11 AM   Specimen: BLOOD  Result Value Ref Range Status   Specimen Description BLOOD SITE NOT SPECIFIED  Final   Special Requests   Final    BOTTLES DRAWN AEROBIC AND ANAEROBIC Blood Culture adequate volume   Culture   Final    NO GROWTH 3 DAYS Performed at [facility], [address], [place], [place] [address]    Report Status PENDING  Incomplete  Respiratory (~20 pathogens) panel by PCR     Status: Abnormal   Collection Time: 10/25/22  4:57 PM   Specimen: Nasopharyngeal Swab; Respiratory  Result Value Ref Range Status   Adenovirus NOT DETECTED NOT DETECTED Final   Coronavirus 229E NOT DETECTED NOT DETECTED Final    Comment: (NOTE) The Coronavirus on the Respiratory Panel, DOES NOT test for the novel  Coronavirus (2019 nCoV)    Coronavirus HKU1 NOT DETECTED NOT DETECTED Final   Coronavirus NL63 NOT DETECTED NOT DETECTED Final   Coronavirus OC43 NOT DETECTED NOT DETECTED Final   Metapneumovirus NOT DETECTED NOT DETECTED Final   Rhinovirus / Enterovirus DETECTED (A) NOT DETECTED Final   Influenza A NOT DETECTED NOT DETECTED Final   Influenza B NOT DETECTED NOT DETECTED Final   Parainfluenza Virus 1 NOT DETECTED NOT DETECTED Final   Parainfluenza Virus 2 NOT DETECTED NOT DETECTED Final   Parainfluenza Virus 3 NOT DETECTED NOT DETECTED Final   Parainfluenza Virus 4 NOT DETECTED NOT DETECTED Final   Respiratory Syncytial Virus NOT DETECTED NOT DETECTED Final   Bordetella pertussis NOT DETECTED NOT DETECTED Final   Bordetella Parapertussis NOT DETECTED NOT DETECTED Final   Chlamydophila pneumoniae NOT DETECTED NOT DETECTED Final   Mycoplasma pneumoniae NOT DETECTED NOT DETECTED Final    Comment:  Performed at [facility], [place]. [address] [place], [place] [address]     Medications:    ARIPiprazole  10 mg Oral Daily   calcitRIOL  0.25 mcg Oral Daily   calcium acetate  667 mg Oral TID WC   Chlorhexidine Gluconate Cloth  6 each Topical Q0600   Chlorhexidine Gluconate Cloth  6 each Topical Q0600   darbepoetin (ARANESP) injection - DIALYSIS  200 mcg Subcutaneous Q Mon-1800   diltiazem  90 mg Oral Q6H   heparin  5,000 Units Subcutaneous Q8H   lidocaine  1 patch Transdermal Q24H   sodium chloride  1 spray Each Nare Once   Continuous Infusions:  ferric gluconate (FERRLECIT) IVPB Stopped (10/26/22 1458)      LOS: 3 days   Charlynne Cousins  [facility]  10/28/2022, 9:48 AM

## 2022-10-28 NOTE — Progress Notes (Signed)
Gave Hydralazine 25mg  for Increase SBP of 180/108. Pt is asymptomatic and voice no complaints.

## 2022-10-28 NOTE — Progress Notes (Signed)
[facility] Progress Note   Subjective:   Patient seen and examined at bedside in room.  Not currently wearing O2.  Denies CP, abdominal pain and n/v/d.   Objective Vitals:   10/27/22 1649 10/27/22 2009 10/28/22 0012 10/28/22 0409  BP: (!) 145/88 (!) 177/116 (!) 153/99 (!) 144/90  Pulse: 87 95 83 87  Resp:  18 18 18   Temp: 98.3 F (36.8 C) 98.6 F (37 C) 99.3 F (37.4 C) 98.2 F (36.8 C)  TempSrc: Oral Oral Oral Oral  SpO2: 95% 96% 96% 99%  Weight:    59 kg   Physical Exam General:WDWN female in NAD Heart:RRR, no MRG appreciated Lungs:CTAB, nml WOB on RA Abdomen:soft, NTND Extremities:1+ LE edema Dialysis Access: LU AVF +b/t throughout - no bruising or edema   Filed Weights   10/28/22 0409  Weight: 59 kg    Intake/Output Summary (Last 24 hours) at 10/28/2022 1203 Last data filed at 10/28/2022 0500 Gross per 24 hour  Intake 810.04 ml  Output --  Net 810.04 ml    Additional Objective Labs: Basic Metabolic Panel: Recent Labs  Lab 10/26/22 0404 10/27/22 0426 10/28/22 0432  NA 135 134* 133*  K 5.6* 4.5 4.6  CL 96* 94* 95*  CO2 21* 23 22  GLUCOSE 106* 93 97  BUN 99* 64* 77*  CREATININE 13.00* 9.53* 10.64*  CALCIUM 8.9 8.9 9.1  PHOS  --  7.7* 8.4*   Liver Function Tests: Recent Labs  Lab 10/24/22 1513 10/26/22 0404 10/27/22 0426 10/28/22 0432  AST 27 23  --   --   ALT 28 21  --   --   ALKPHOS 54 50  --   --   BILITOT 0.6 0.6  --   --   PROT 6.9 6.6  --   --   ALBUMIN 2.7* 2.4* 2.2* 2.2*   CBC: Recent Labs  Lab 10/24/22 1513 10/25/22 0527 10/26/22 0404 10/27/22 0426 10/28/22 0432  WBC 10.7*  --  12.6* 10.7* 9.3  NEUTROABS 9.1*  --   --   --   --   HGB 9.4*   < > 9.0* 8.0* 8.4*  HCT 29.8*   < > 29.6* 25.5* 27.8*  MCV 99.3  --  100.3* 98.8 98.9  PLT 173  --  170 173 223   < > = values in this interval not displayed.   Blood Culture    Component Value Date/Time   SDES BLOOD SITE NOT SPECIFIED 10/25/2022 1111    SPECREQUEST  10/25/2022 1111    BOTTLES DRAWN AEROBIC AND ANAEROBIC Blood Culture adequate volume   CULT  10/25/2022 1111    NO GROWTH 3 DAYS Performed at [facility], [place] [address] [place], [place] [address]    REPTSTATUS PENDING 10/25/2022 1111    Medications:  ferric gluconate (FERRLECIT) IVPB Stopped (10/26/22 1458)    ARIPiprazole  10 mg Oral Daily   calcitRIOL  0.25 mcg Oral Daily   calcium acetate  667 mg Oral TID WC   Chlorhexidine Gluconate Cloth  6 each Topical Q0600   Chlorhexidine Gluconate Cloth  6 each Topical Q0600   darbepoetin (ARANESP) injection - DIALYSIS  200 mcg Subcutaneous Q Mon-1800   diltiazem  90 mg Oral Q6H   heparin  5,000 Units Subcutaneous Q8H   lidocaine  1 patch Transdermal Q24H   sodium chloride  1 spray Each Nare Once    Dialysis Orders: [facility] MWF, F180, BFR 400, edw 92kg, 2K,  2Ca, LUE AVF, Mircera 272mcg q 2 weeks Hep 5000   Assessment/Plan: 1. Volume overload - 2/2 non compliance with HD. CXR w/pulmonary vascular congestion, moderate pleural effusion decreased in size. UF 4L with HD 11/28.  Max UF as tolerated today. 2. Hypertensive Urgency - Improving. Continue home meds.  Worsened by volume overload.  3. ESRD - on HD MWF.  Non compliant as outpatient.  Extra HD ordered yesterday but refused after getting stuck d/t pain in access.  No signs of infiltration. [facility] with [facility] but they are unable to transport her until Friday.  HD today per regular schedule w/expert cannulator.  4. Anemia of CKD- Hgb 8.4. Tsat 6%.  Start iron load. Aranesp 218mcg qwk given 11/27  5. Secondary hyperparathyroidism - Ca in goal. Phos elevated. Continue binders and calcitriol.   6. Nutrition - Renal diet w/fluid restrictions.  7. Acute on chronic CHF - EF 40-45%. Repeat ECHO ordered.  8. Severe MR - repeat ECHO ordered.  Jen Mow, PA-C [facility] 10/28/2022,12:03 PM  LOS: 3 days

## 2022-10-29 DIAGNOSIS — F1910 Other psychoactive substance abuse, uncomplicated: Secondary | ICD-10-CM | POA: Diagnosis not present

## 2022-10-29 DIAGNOSIS — N186 End stage renal disease: Secondary | ICD-10-CM | POA: Diagnosis not present

## 2022-10-29 DIAGNOSIS — I160 Hypertensive urgency: Secondary | ICD-10-CM | POA: Diagnosis not present

## 2022-10-29 DIAGNOSIS — J810 Acute pulmonary edema: Secondary | ICD-10-CM | POA: Diagnosis not present

## 2022-10-29 MED ORDER — DILTIAZEM HCL 90 MG PO TABS: 90.0000 mg | ORAL_TABLET | Freq: Four times a day (QID) | ORAL | 0 refills | Status: DC

## 2022-10-29 MED ORDER — DILTIAZEM HCL ER COATED BEADS 180 MG PO CP24
360.0000 mg | ORAL_CAPSULE | Freq: Every day | ORAL | Status: DC
  Administered 2022-10-29: 360 mg via ORAL
  Filled 2022-10-29: qty 2

## 2022-10-29 MED ORDER — DILTIAZEM HCL ER COATED BEADS 360 MG PO CP24: 360.0000 mg | ORAL_CAPSULE | Freq: Every day | ORAL | 0 refills | Status: DC

## 2022-10-29 NOTE — Progress Notes (Signed)
Refusing continuous cardiac monitoring. Patient pulls out lead repeatedly. Easily irritated when nursing connects the leads. Goes right back and pull them out. [facility] on-call notified.

## 2022-10-29 NOTE — Progress Notes (Signed)
RN called [facility] to give report no answer will try again. [phone]

## 2022-10-29 NOTE — Progress Notes (Addendum)
   10/29/22 2122  Provider Notification  Provider Name/Title Aileen Fass  Date Provider Notified 10/29/22  Time Provider Notified [phone]  Method of Notification Page  Notification Reason Other (Comment) (Pt has been refusing cardiac monitoring. Has been refusing Heparin SQ and CHG baths.)  Provider response Other (Comment) (waiting for response)   Provider on call for [facility] notified of patient non-compliance. She denies CP, palpitations or SOB.  SBP remains high but improved in the 160s.   Centralized Telemetry has been notified of patient refusal for cardiac monitoring.

## 2022-10-29 NOTE — Progress Notes (Signed)
Continues to refuse Heparin SQ. Refused CHG bath. Also, patient did not want to use nasal cannula for oxygen and states she uses simple face mask. However, patient does not wear face mask either. Respirations even and unlabored. Denies shortness of breath on this shift. O2sat continues in the upper 90s to 100 on room air.

## 2022-10-29 NOTE — Progress Notes (Signed)
Refusing continuous cardiac monitoring. Patient pulls out lead repeatedly. Easily irritated when nursing connects the leads. Goes right back and pull them out. VSS. Denies CP, palpitations, or SOB. [facility] on-call notified.

## 2022-10-29 NOTE — Progress Notes (Signed)
RN called [facility] and gave report to [place], and she stated understanding. IV has been removed

## 2022-10-29 NOTE — Progress Notes (Signed)
[facility] Progress Note   Subjective:   Patient seen and examined at bedside. Breathing better this AM.  Feels like LE edema improving.  Admits to CP with movement.  Denies abdominal pain and n/v/d.  Tolerated dialysis yesterday without issue.   Objective Vitals:   10/28/22 2240 10/29/22 0110 10/29/22 0623 10/29/22 0817  BP: (!) 161/105 (!) 145/89 (!) 167/95 (!) 142/72  Pulse: (!) 101 92 92 83  Resp: 18 18 18 16   Temp: 98.5 F (36.9 C) 97.9 F (36.6 C) 98.2 F (36.8 C) 98.6 F (37 C)  TempSrc: Oral Axillary Oral Oral  SpO2: 100% 100% 96% 92%  Weight:       Physical Exam General:ill appearing female in NAD Heart:RRR Lungs:mostly CTAB, decreased on RLL, no crackles Abdomen:soft, NTND Extremities:1+ LE edema b/l, cuffs in place Dialysis Access: LU AVF +b/t   Filed Weights   10/28/22 0409 10/28/22 1600 10/28/22 1955  Weight: 59 kg 58.3 kg 54.1 kg    Intake/Output Summary (Last 24 hours) at 10/29/2022 0957 Last data filed at 10/29/2022 0600 Gross per 24 hour  Intake 1351.96 ml  Output 3500 ml  Net -2148.04 ml    Additional Objective Labs: Basic Metabolic Panel: Recent Labs  Lab 10/26/22 0404 10/27/22 0426 10/28/22 0432  NA 135 134* 133*  K 5.6* 4.5 4.6  CL 96* 94* 95*  CO2 21* 23 22  GLUCOSE 106* 93 97  BUN 99* 64* 77*  CREATININE 13.00* 9.53* 10.64*  CALCIUM 8.9 8.9 9.1  PHOS  --  7.7* 8.4*   Liver Function Tests: Recent Labs  Lab 10/24/22 1513 10/26/22 0404 10/27/22 0426 10/28/22 0432  AST 27 23  --   --   ALT 28 21  --   --   ALKPHOS 54 50  --   --   BILITOT 0.6 0.6  --   --   PROT 6.9 6.6  --   --   ALBUMIN 2.7* 2.4* 2.2* 2.2*   CBC: Recent Labs  Lab 10/24/22 1513 10/25/22 0527 10/26/22 0404 10/27/22 0426 10/28/22 0432  WBC 10.7*  --  12.6* 10.7* 9.3  NEUTROABS 9.1*  --   --   --   --   HGB 9.4*   < > 9.0* 8.0* 8.4*  HCT 29.8*   < > 29.6* 25.5* 27.8*  MCV 99.3  --  100.3* 98.8 98.9  PLT 173  --  170 173 223   < >  = values in this interval not displayed.   Blood Culture    Component Value Date/Time   SDES BLOOD SITE NOT SPECIFIED 10/25/2022 1111   SPECREQUEST  10/25/2022 1111    BOTTLES DRAWN AEROBIC AND ANAEROBIC Blood Culture adequate volume   CULT  10/25/2022 1111    NO GROWTH 4 DAYS Performed at [facility], [place] [address], [place], [place] [address]    REPTSTATUS PENDING 10/25/2022 1111    Studies/Results: VAS US DUPLEX DIALYSIS ACCESS (AVF, AVG)  Result Date: 10/28/2022 DIALYSIS ACCESS Patient Name:  Natasha Long  Date of Exam:   10/28/2022 Medical Rec #: [id number]          Accession #:    [id number] Date of Birth: 08-Dec-1995          Patient Gender: F Patient Age:   26 years Exam Location:  [facility] Procedure:      VAS US DUPLEX DIALYSIS ACCESS (AVF, AVG) Referring Phys: Jen Mow --------------------------------------------------------------------------------  Reason for Exam: Patient  concern for pain and possible infiltration during                  dialysis. No clinical evidence of infiltration per notes. Access Site: Left Upper Extremity. Access Type: Brachial-cephalic AVF. Limitations: Patient movement, tortuosity of AVF Comparison Study: 01-20-2021 Prior duplex dialysis access showed patent                   brachial-cephalic AVF with one competing branch Performing Technologist: Darlin Coco RDMS, RVT  Examination Guidelines: A complete evaluation includes B-mode imaging, spectral Doppler, color Doppler, and power Doppler as needed of all accessible portions of each vessel. Unilateral testing is considered an integral part of a complete examination. Limited examinations for reoccurring indications may be performed as noted.  Findings: +--------------------+----------+-----------------+--------+ AVF                 PSV (cm/s)Flow Vol (mL/min)Comments +--------------------+----------+-----------------+--------+ Native artery inflow   234          1890                 +--------------------+----------+-----------------+--------+ AVF Anastomosis        229                              +--------------------+----------+-----------------+--------+  +---------------+----------+-------------+----------+--------------------------+ OUTFLOW VEIN   PSV (cm/s)Diameter (cm)Depth (cm)         Describe          +---------------+----------+-------------+----------+--------------------------+ Subclavian vein   383                                                      +---------------+----------+-------------+----------+--------------------------+ Axillary vein      69                                                      +---------------+----------+-------------+----------+--------------------------+ Confluence        393                                                      +---------------+----------+-------------+----------+--------------------------+ Prox UA            47        1.21        0.21                              +---------------+----------+-------------+----------+--------------------------+ Mid UA            204        2.17        0.20      aneurysmal and small  branch           +---------------+----------+-------------+----------+--------------------------+ Dist UA            41        2.00        0.20   aneurysmal and velocities                                                          <100 cm/s          +---------------+----------+-------------+----------+--------------------------+  Summary: Arteriovenous fistula-Velocities less than 100cm/s noted. Arteriovenous fistula-Aneurysmal dilatation noted.  Appearance of subcutaneous edema of the surrounding tissues, most notably at the proximal to mid upper arm.  *See table(s) above for measurements and observations.  Diagnosing physician: Harold Barban MD Electronically signed by Harold Barban MD on  10/28/2022 at 7:44:50 PM.   --------------------------------------------------------------------------------   Final     Medications:  ferric gluconate (FERRLECIT) IVPB Stopped (10/28/22 2209)    ARIPiprazole  10 mg Oral Daily   calcitRIOL  0.25 mcg Oral Daily   calcium acetate  667 mg Oral TID WC   Chlorhexidine Gluconate Cloth  6 each Topical Q0600   Chlorhexidine Gluconate Cloth  6 each Topical Q0600   darbepoetin (ARANESP) injection - DIALYSIS  200 mcg Subcutaneous Q Mon-1800   diltiazem  360 mg Oral Daily   heparin  5,000 Units Subcutaneous Q8H   lidocaine  1 patch Transdermal Q24H   sodium chloride  1 spray Each Nare Once    Dialysis Orders: [facility] MWF, F180, BFR 400, edw 92kg, 2K, 2Ca, LUE AVF, Mircera 235mcg q 2 weeks Hep 5000   Assessment/Plan: 1. Volume overload - 2/2 non compliance with HD. CXR w/pulmonary vascular congestion, moderate pleural effusion decreased in size. UF 4L with HD 11/28, 2.5L yesterday. 2. Hypertensive Urgency - Improving. Continue home meds.  Worsened by volume overload.  3. ESRD - on HD MWF.  HD yesterday per regular schedule.  Next HD tomorrow at outpatient center, [facility]. 4. Anemia of CKD- Hgb 8.4. Tsat 6%.  Start iron load. Aranesp 258mcg qwk given 11/27  5. Secondary hyperparathyroidism - Ca in goal. Phos elevated. Continue binders and calcitriol.   6. Nutrition - Renal diet w/fluid restrictions.  7. Acute on chronic CHF - EF 40-45%. Repeat ECHO ordered.  8. Severe MR - repeat ECHO ordered.  Jen Mow, PA-C [facility] 10/29/2022,9:57 AM  LOS: 4 days

## 2022-10-29 NOTE — TOC CM/SW Note (Addendum)
Patient to discharge back to [facility] . Spoke to [place] at [facility].   Sharyn Lull confirmed transportation to hemodialysis per T. Georgeanne Nim. Michelle requesting bedside nurse to call report to [phone], send discharge paperwork including discharge summary with her . Nurse aware

## 2022-10-29 NOTE — Discharge Summary (Addendum)
Physician Discharge Summary  Natasha Long IOE:[id number] DOB: 1996/03/17 DOA: 10/24/2022  PCP: Natasha Stain, MD  Admit date: 10/24/2022 Discharge date: 10/29/2022  Admitted From: Custody of the state  Disposition: Jail (custody of the state )  Recommendations for Outpatient Follow-up:  Follow up with PCP in 1-2 weeks Please obtain BMP/CBC in one week   Home Health:No Equipment/Devices:None Discharge Condition:Stable CODE STATUS:Full Diet recommendation: Heart Healthy  Brief/Interim Summary: 26 y.o. female past medical history significant for borderline personality disorder, PTSD, end-stage renal disease severe mitral regurgitation, chronic systolic heart failure with an EF of 40%, polysubstance abuse comes in via EMS from [facility] jail complaining of pleuritic chest pain and shortness of breath along with productive cough and bilateral lower extremity edema started about 1 week prior to admission.  She relates noncompliance with her medications in the ED was requiring 4 L of oxygen, with a white count of 10 chest x-ray showed right pleural effusion pulmonary edema.  CTA negative for PE moderate to large pericardial effusion, with moderate right pleural effusion chest wall and abdominal edema consistent with anasarca.  Neurology was consulted   Discharge Diagnoses:  Principal Problem:   Hypertensive urgency Active Problems:   ESRD on dialysis ([place])   Pericardial effusion without cardiac tamponade   Polysubstance abuse (HCC)   PTSD (post-traumatic stress disorder)   Borderline personality disorder (HCC)   Hypervolemia associated with renal insufficiency   Volume overload   Acute pulmonary edema (HCC)  Acute respiratory failure with hypoxia/hypervolemia associated with renal insufficiency/acute pulmonary edema: Initially on admission requiring 4 to 6 L of oxygen to keep saturations greater 96%. Likely due to noncompliance, nephrology was consulted and she was  dialyzed 3 days she is negative about 8 L. We were able to wean her to room air. She will continue her regular dialysis treatment Monday Wednesday and Fridays. She will splint her torsemide as an outpatient.  Moderate pericardial effusion: 2D echo showed decreased global hypokinesia with an EF of 18%, grade 2 diastolic heart failure and a moderate pericardial effusion which is unchanged from previous this was discussed with cardiology on-call and we compare her to previous 2D echo that showed an unchanged per-cardial effusion no signs of tamponade.  Significant change from prior studies.  Hypertensive urgency: Due to noncompliance she was continue on current regimen no changes made.  Rhinovirus infection: She was positive we were able to wean her room air and her symptoms resolved.  Anemia of chronic renal disease: Status post IV iron on 10/26/2022 by renal continue IV iron as an outpatient.  PTSD/borderline for mild disorder: Continue profile and hydroxyzine.  Polysubstance abuse: Counseling  Stage II significant venous ulcer present on admission: Noted.  Discharge Instructions  Discharge Instructions     Diet - low sodium heart healthy   Complete by: As directed    Increase activity slowly   Complete by: As directed       Allergies as of 10/29/2022       Reactions   Bupropion Anxiety, Other (See Comments), Nausea And Vomiting   Caused panic attacks Caused panic attacks    "Adverse effects"   Prozac [fluoxetine Hcl] Anxiety, Other (See Comments)   Caused panic attacks   Prednisone Other (See Comments)   Pt stated this med caused pancreatitis        Medication List     STOP taking these medications    lidocaine-prilocaine cream Commonly known as: EMLA   losartan 50 MG tablet Commonly known  as: COZAAR       TAKE these medications    ARIPiprazole 5 MG tablet Commonly known as: ABILIFY Take 1.5 tablets (7.5 mg total) by mouth daily. What changed:  how much to take   calcitRIOL 0.25 MCG capsule Commonly known as: ROCALTROL Take 1 capsule (0.25 mcg total) by mouth Every Tuesday,Thursday,and Saturday with dialysis. What changed: when to take this   calcium acetate 667 MG capsule Commonly known as: PHOSLO Take 1 capsule (667 mg total) by mouth 3 (three) times daily with meals.   dicyclomine 20 MG tablet Commonly known as: BENTYL Take 1 tablet (20 mg total) by mouth every 6 (six) hours as needed for spasms (abdominal cramping).   diltiazem 360 MG 24 hr capsule Commonly known as: Cardizem CD Take 1 capsule (360 mg total) by mouth daily.   gabapentin 300 MG capsule Commonly known as: NEURONTIN Take 1 capsule (300 mg total) by mouth 2 (two) times daily.   hydrOXYzine 25 MG capsule Commonly known as: VISTARIL Take 1 capsule (25 mg total) by mouth every 8 (eight) hours as needed for anxiety. What changed: how much to take   methocarbamol 500 MG tablet Commonly known as: ROBAXIN Take 1 tablet (500 mg total) by mouth every 8 (eight) hours as needed for muscle spasms.   multivitamin Tabs tablet Take 1 tablet by mouth daily.   Renvela 800 MG tablet Generic drug: sevelamer carbonate Take 2 tablets (1,600 mg total) by mouth 3 (three) times daily with meals.   torsemide 100 MG tablet Commonly known as: DEMADEX Take 1 tablet (100 mg total) by mouth every morning.   traZODone 100 MG tablet Commonly known as: DESYREL Take 1 tablet (100 mg total) by mouth at bedtime.        Allergies  Allergen Reactions   Bupropion Anxiety, Other (See Comments) and Nausea And Vomiting    Caused panic attacks  Caused panic attacks    "Adverse effects"   Prozac [Fluoxetine Hcl] Anxiety and Other (See Comments)    Caused panic attacks   Prednisone Other (See Comments)    Pt stated this med caused pancreatitis    Consultations: Nephrology   Procedures/Studies: VAS [place] (AVF, AVG)  Result Date:  10/28/2022 DIALYSIS ACCESS Patient Name:  Natasha Long  Date of Exam:   10/28/2022 Medical Rec #: [id number]          Accession #:    [id number] Date of Birth: [birth year]          Patient Gender: F Patient Age:   27 years Exam Location:  [facility] Procedure:      VAS US DUPLEX DIALYSIS ACCESS (AVF, AVG) Referring Phys: Jen Mow --------------------------------------------------------------------------------  Reason for Exam: Patient concern for pain and possible infiltration during                  dialysis. No clinical evidence of infiltration per notes. Access Site: Left Upper Extremity. Access Type: Brachial-cephalic AVF. Limitations: Patient movement, tortuosity of AVF Comparison Study: 01-20-2021 Prior duplex dialysis access showed patent                   brachial-cephalic AVF with one competing branch Performing Technologist: Darlin Coco RDMS, RVT  Examination Guidelines: A complete evaluation includes B-mode imaging, spectral Doppler, color Doppler, and power Doppler as needed of all accessible portions of each vessel. Unilateral testing is considered an integral part of a complete examination. Limited examinations for reoccurring indications may be performed  as noted.  Findings: +--------------------+----------+-----------------+--------+ AVF                 PSV (cm/s)Flow Vol (mL/min)Comments +--------------------+----------+-----------------+--------+ Native artery inflow   234          1890                +--------------------+----------+-----------------+--------+ AVF Anastomosis        229                              +--------------------+----------+-----------------+--------+  +---------------+----------+-------------+----------+--------------------------+ OUTFLOW VEIN   PSV (cm/s)Diameter (cm)Depth (cm)         Describe          +---------------+----------+-------------+----------+--------------------------+ Subclavian vein   383                                                       +---------------+----------+-------------+----------+--------------------------+ Axillary vein      69                                                      +---------------+----------+-------------+----------+--------------------------+ Confluence        393                                                      +---------------+----------+-------------+----------+--------------------------+ Prox UA            47        1.21        0.21                              +---------------+----------+-------------+----------+--------------------------+ Mid UA            204        2.17        0.20      aneurysmal and small                                                              branch           +---------------+----------+-------------+----------+--------------------------+ Dist UA            41        2.00        0.20   aneurysmal and velocities                                                          <100 cm/s          +---------------+----------+-------------+----------+--------------------------+  Summary: Arteriovenous fistula-Velocities less than 100cm/s noted. Arteriovenous fistula-Aneurysmal dilatation noted.  Appearance of subcutaneous edema of the surrounding tissues, most notably at the proximal to mid upper arm.  *See table(s) above for measurements and observations.  Diagnosing physician: Harold Barban MD Electronically signed by Harold Barban MD on 10/28/2022 at 7:44:50 PM.   --------------------------------------------------------------------------------   Final    ECHOCARDIOGRAM COMPLETE  Result Date: 10/25/2022    ECHOCARDIOGRAM REPORT   Patient Name:   Natasha Long Date of Exam: 10/25/2022 Medical Rec #:  [id number]         Height:       68.0 in Accession #:    [id number]        Weight:       200.0 lb Date of Birth:  [birth year]         BSA:          2.044 m Patient Age:    26 years          BP:           170/123 mmHg  Patient Gender: F                 HR:           108 bpm. Exam Location:  Inpatient Procedure: 2D Echo, Cardiac Doppler, Color Doppler and 3D Echo STAT ECHO Indications:    Pericardial effusion  History:        Patient has prior history of Echocardiogram examinations, most                 recent 08/25/2022. Mitral Valve Disease, Signs/Symptoms:Shortness                 of Breath; Risk Factors:Hypertension. Polysubstance abuse.                 HFrEF. ESRD.  Sonographer:    Clayton Lefort RDCS (AE) Referring Phys: [place]  1. Left ventricular ejection fraction, by estimation, is 40 to 45%. Left ventricular ejection fraction by 3D volume is 43 %. The left ventricle has mildly decreased function. The left ventricle demonstrates global hypokinesis. The left ventricular internal cavity size was mildly dilated. There is severe concentric left ventricular hypertrophy. Left ventricular diastolic parameters are consistent with Grade II diastolic dysfunction (pseudonormalization). Elevated left atrial pressure.  2. Right ventricular systolic function is normal. The right ventricular size is moderately enlarged. There is severely elevated pulmonary artery systolic pressure. The estimated right ventricular systolic pressure is 65.9 mmHg.  3. Left atrial size was mildly dilated.  4. Right atrial size was mildly dilated.  5. Moderate pericardial effusion. The pericardial effusion is circumferential. There is no evidence of cardiac tamponade.  6. The mitral valve is degenerative. Severe mitral valve regurgitation. The mean mitral valve gradient is 7.1 mmHg with average heart rate of 110 bpm. Moderate to severe mitral annular calcification.  7. The tricuspid valve is abnormal. 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  8. The aortic valve is tricuspid. There is mild calcification of the aortic valve. There is mild thickening of the aortic valve. Aortic valve regurgitation is mild to moderate. Mild to moderate aortic valve  stenosis.  9. The inferior vena cava is dilated in size with <50% respiratory variability, suggesting right atrial pressure of 15 mmHg. Comparison(s): No significant change from prior study. Prior images reviewed side by side. FINDINGS  Left Ventricle: Left ventricular ejection fraction, by estimation, is 40 to 45%. Left ventricular ejection fraction by 3D volume is 43 %. The left ventricle has mildly decreased function. The left  ventricle demonstrates global hypokinesis. The left ventricular internal cavity size was mildly dilated. There is severe concentric left ventricular hypertrophy. Left ventricular diastolic parameters are consistent with Grade II diastolic dysfunction (pseudonormalization). Elevated left atrial pressure. Right Ventricle: The right ventricular size is moderately enlarged. No increase in right ventricular wall thickness. Right ventricular systolic function is normal. There is severely elevated pulmonary artery systolic pressure. The tricuspid regurgitant velocity is 3.51 m/s, and with an assumed right atrial pressure of 15 mmHg, the estimated right ventricular systolic pressure is 03.7 mmHg. Left Atrium: Left atrial size was mildly dilated. Right Atrium: Right atrial size was mildly dilated. Pericardium: A moderately sized pericardial effusion is present. The pericardial effusion is circumferential. There is no evidence of cardiac tamponade. Mitral Valve: The mitral valve is degenerative in appearance. Moderate to severe mitral annular calcification. Severe mitral valve regurgitation, with centrally-directed jet. The mean mitral valve gradient is 7.1 mmHg with average heart rate of 110 bpm. Tricuspid Valve: The tricuspid valve is abnormal. 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 Aortic Valve: The aortic valve is tricuspid. There is mild calcification of the aortic valve. There is mild thickening of the aortic valve. Aortic valve regurgitation is mild to moderate. Mild to moderate aortic  stenosis is present. Aortic valve mean gradient measures 26.0 mmHg. Aortic valve peak gradient measures 47.1 mmHg. Aortic valve area, by VTI measures 1.96 cm. Pulmonic Valve: The pulmonic valve was normal in structure. Pulmonic valve regurgitation is trivial. Aorta: The aortic root is normal in size and structure. Venous: The inferior vena cava is dilated in size with less than 50% respiratory variability, suggesting right atrial pressure of 15 mmHg. IAS/Shunts: No atrial level shunt detected by color flow Doppler. Additional Comments: There is pleural effusion in the left lateral region.  LEFT VENTRICLE PLAX 2D LVIDd:         5.80 cm LVIDs:         4.40 cm LV PW:         1.80 cm         3D Volume EF LV IVS:        1.60 cm         LV 3D EF:    Left LVOT diam:     2.10 cm                      ventricul LV SV:         99                           ar LV SV Index:   48                           ejection LVOT Area:     3.46 cm                     fraction                                             by 3D  volume is                                             43 %.                                 3D Volume EF:                                3D EF:        43 %                                LV EDV:       291 ml                                LV ESV:       167 ml                                LV SV:        124 ml RIGHT VENTRICLE             IVC RV Basal diam:  5.30 cm     IVC diam: 2.50 cm RV S prime:     17.70 cm/s TAPSE (M-mode): 1.9 cm LEFT ATRIUM           Index        RIGHT ATRIUM           Index LA diam:      3.40 cm 1.66 cm/m   RA Area:     20.40 cm LA Vol (A4C): 89.7 ml 43.89 ml/m  RA Volume:   55.40 ml  27.11 ml/m  AORTIC VALVE AV Area (Vmax):    1.93 cm AV Area (Vmean):   1.78 cm AV Area (VTI):     1.96 cm AV Vmax:           343.00 cm/s AV Vmean:          239.000 cm/s AV VTI:            0.504 m AV Peak Grad:      47.1 mmHg AV Mean Grad:      26.0 mmHg LVOT Vmax:          191.00 cm/s LVOT Vmean:        123.000 cm/s LVOT VTI:          0.285 m LVOT/AV VTI ratio: 0.57  AORTA Ao Root diam: 3.00 cm MITRAL VALVE              TRICUSPID VALVE MV Area (PHT): 2.51 cm   TR Peak grad:   49.3 mmHg MV Mean grad:  7.1 mmHg   TR Vmax:        351.00 cm/s MV Decel Time: 303 msec MR Peak grad: 122.8 mmHg  SHUNTS MR Mean grad: 83.0 mmHg   Systemic VTI:  0.29 m MR Vmax:      554.00 cm/s Systemic Diam: 2.10 cm MR Vmean:     438.0 cm/s Dani Gobble Croitoru MD Electronically signed by  Dani Gobble Croitoru MD Signature Date/Time: 10/25/2022/12:08:01 PM    Final    CT Angio Chest PE W and/or Wo Contrast  Result Date: 10/25/2022 CLINICAL DATA:  Rule out pulmonary embolus. EXAM: CT ANGIOGRAPHY CHEST WITH CONTRAST TECHNIQUE: Multidetector CT imaging of the chest was performed using the standard protocol during bolus administration of intravenous contrast. Multiplanar CT image reconstructions and MIPs were obtained to evaluate the vascular anatomy. RADIATION DOSE REDUCTION: This exam was performed according to the departmental dose-optimization program which includes automated exposure control, adjustment of the mA and/or kV according to patient size and/or use of iterative reconstruction technique. CONTRAST:  58mL OMNIPAQUE IOHEXOL 350 MG/ML SOLN COMPARISON:  09/04/2022 FINDINGS: Cardiovascular: Cardiac enlargement is stable. Moderate to large pericardial effusion is again noted. Satisfactory opacification of the pulmonary arteries to the segmental level. No evidence of acute pulmonary embolism. Mediastinum/Nodes: Thyroid gland, trachea and esophagus are unremarkable. No adenopathy identified. Diffuse edema noted throughout the mediastinal fat. Lungs/Pleura: There is been interval resolution of previous left pleural effusion. Unchanged appearance moderate right pleural effusion. Scattered areas of subsegmental atelectasis are identified bilaterally. No airspace disease identified. Upper Abdomen: No acute  abnormality. Perihepatic ascites noted. Signs of previous cholecystectomy. Musculoskeletal: Marked diffuse edema is identified throughout the chest wall and abdominal wall. No acute or suspicious osseous findings. Review of the MIP images confirms the above findings. IMPRESSION: 1. No evidence for acute pulmonary embolism. 2. Stable cardiomegaly with moderate to large pericardial effusion. 3. Stable moderate right pleural effusion.Interval resolution of previous left pleural effusion. 4. Marked diffuse edema throughout the chest wall and abdominal wall compatible with anasarca. 5. Perihepatic ascites. Electronically Signed   By: Kerby Moors M.D.   On: 10/25/2022 08:44   DG Chest Port 1 View  Result Date: 10/25/2022 CLINICAL DATA:  Shortness of breath. EXAM: PORTABLE CHEST 1 VIEW COMPARISON:  October 24, 2022 FINDINGS: The cardiac silhouette is moderately enlarged and unchanged in size. Stable, moderate severity atelectasis and/or infiltrate is seen within the bilateral lung bases. There is stable prominence of the perihilar pulmonary vasculature. A small left pleural effusion is seen. A moderate sized right pleural effusion is also noted. This is decreased in size when compared to the prior study. No pneumothorax is identified. The visualized skeletal structures are unremarkable. IMPRESSION: 1. Stable cardiomegaly and pulmonary vascular congestion. 2. Stable, moderate severity bibasilar atelectasis and/or infiltrate. 3. Moderate sized right-sided pleural effusion, decreased in size when compared to the prior exam. Electronically Signed   By: Virgina Norfolk M.D.   On: 10/25/2022 03:03   CT Lumbar Spine Wo Contrast  Result Date: 10/24/2022 CLINICAL DATA:  Trauma. Back pain. EXAM: CT LUMBAR SPINE WITHOUT CONTRAST TECHNIQUE: Multidetector CT imaging of the lumbar spine was performed without intravenous contrast administration. Multiplanar CT image reconstructions were also generated. RADIATION DOSE  REDUCTION: This exam was performed according to the departmental dose-optimization program which includes automated exposure control, adjustment of the mA and/or kV according to patient size and/or use of iterative reconstruction technique. COMPARISON:  CT scan 08/31/2022 FINDINGS: Segmentation: There are five lumbar type vertebral bodies. The last full intervertebral disc space is labeled L5-S1. Alignment: Normal Vertebrae: No acute fracture. Paraspinal and other soft tissues: No significant paraspinal findings. Stable SI joint degenerative changes. Age advanced vascular calcifications. Moderate free pelvic fluid. Severe diffuse body wall edema. Disc levels: Stable advanced degenerative disc disease at L5-S1 with bulging annulus and osteophytic ridging but no significant spinal or foraminal stenosis in the lumbar spine. IMPRESSION: 1. Normal  alignment of the lumbar vertebral bodies and no acute fracture. 2. Stable advanced degenerative disc disease at L5-S1 with bulging annulus and osteophytic ridging but no significant spinal or foraminal stenosis. 3. Age advanced vascular calcifications. 4. Moderate free pelvic fluid and severe diffuse body wall edema. Electronically Signed   By: Marijo Sanes M.D.   On: 10/24/2022 18:51   DG Chest Portable 1 View  Result Date: 10/24/2022 CLINICAL DATA:  Dyspnea after missing dialysis EXAM: PORTABLE CHEST 1 VIEW COMPARISON:  Radiograph 10/04/2022 FINDINGS: Interval increase in the now moderate right pleural effusion with similar small left pleural effusion. Similar cardiomegaly. Pulmonary vascular congestion has increased since 10/04/2022 with lower lung infiltrates which may represent edema or infection. No acute osseous abnormality. IMPRESSION: Constellation of findings favored to represent worsening CHF. Increased moderate right and similar small left pleural effusions. Electronically Signed   By: Placido Sou M.D.   On: 10/24/2022 16:25   CT HEAD WO CONTRAST  (5MM)  Result Date: 10/04/2022 CLINICAL DATA:  Mental status change, unknown cause EXAM: CT HEAD WITHOUT CONTRAST TECHNIQUE: Contiguous axial images were obtained from the base of the skull through the vertex without intravenous contrast. RADIATION DOSE REDUCTION: This exam was performed according to the departmental dose-optimization program which includes automated exposure control, adjustment of the mA and/or kV according to patient size and/or use of iterative reconstruction technique. COMPARISON:  09/29/2022 FINDINGS: Brain: No evidence of acute infarction, hemorrhage, hydrocephalus, extra-axial collection or mass lesion/mass effect. Vascular: No hyperdense vessel or unexpected calcification. Skull: Normal. Negative for fracture or focal lesion. Sinuses/Orbits: No acute finding. Other: None. IMPRESSION: No acute intracranial abnormality. Electronically Signed   By: Davina Poke D.O.   On: 10/04/2022 17:53   DG Chest 1 View  Result Date: 10/04/2022 CLINICAL DATA:  Congestive heart failure EXAM: CHEST  1 VIEW COMPARISON:  10/03/2022 FINDINGS: Stable cardiomegaly and pulmonary vascular congestion. Small to moderate bilateral pleural effusions with associated bibasilar opacities. No pneumothorax. IMPRESSION: Persistent findings of CHF with small to moderate bilateral pleural effusions and associated bibasilar opacities. Electronically Signed   By: Davina Poke D.O.   On: 10/04/2022 16:00   DG Chest 2 View  Result Date: 10/03/2022 CLINICAL DATA:  Chest pain and syncope. EXAM: CHEST - 2 VIEW COMPARISON:  September 29, 2022 FINDINGS: The cardiac silhouette is moderately enlarged and unchanged in size. Mild areas of atelectasis and/or infiltrate are seen within the bilateral lung bases. Small to moderate-sized bilateral pleural effusions are seen. No pneumothorax is identified. The visualized skeletal structures are unremarkable. IMPRESSION: 1. Stable cardiomegaly. 2. Mild bibasilar atelectasis and/or  infiltrate. 3. Small to moderate-sized bilateral pleural effusions. Electronically Signed   By: Virgina Norfolk M.D.   On: 10/03/2022 21:16   CT HEAD WO CONTRAST (5MM)  Result Date: 09/29/2022 CLINICAL DATA:  Altered mental status EXAM: CT HEAD WITHOUT CONTRAST TECHNIQUE: Contiguous axial images were obtained from the base of the skull through the vertex without intravenous contrast. RADIATION DOSE REDUCTION: This exam was performed according to the departmental dose-optimization program which includes automated exposure control, adjustment of the mA and/or kV according to patient size and/or use of iterative reconstruction technique. COMPARISON:  05/21/2022 FINDINGS: Brain: No acute intracranial findings are seen. There are no signs of bleeding within the cranium. Ventricles are not dilated. There is no focal edema or mass effect. Vascular: Unremarkable. Skull: Unremarkable. Sinuses/Orbits: There is mild mucosal thickening in the ethmoid sinus. Other: None. IMPRESSION: No acute intracranial findings are seen in noncontrast CT  brain. Electronically Signed   By: Elmer Picker M.D.   On: 09/29/2022 16:13   (Echo, Carotid, EGD, Colonoscopy, ERCP)    Subjective: No complaints today  Discharge Exam: Vitals:   10/29/22 0623 10/29/22 0817  BP: (!) 167/95 (!) 142/72  Pulse: 92 83  Resp: 18 16  Temp: 98.2 F (36.8 C) 98.6 F (37 C)  SpO2: 96% 92%   Vitals:   10/28/22 2240 10/29/22 0110 10/29/22 0623 10/29/22 0817  BP: (!) 161/105 (!) 145/89 (!) 167/95 (!) 142/72  Pulse: (!) 101 92 92 83  Resp: 18 18 18 16   Temp: 98.5 F (36.9 C) 97.9 F (36.6 C) 98.2 F (36.8 C) 98.6 F (37 C)  TempSrc: Oral Axillary Oral Oral  SpO2: 100% 100% 96% 92%  Weight:        General: Pt is alert, awake, not in acute distress Cardiovascular: RRR, S1/S2 +, no rubs, no gallops Respiratory: CTA bilaterally, no wheezing, no rhonchi Abdominal: Soft, NT, ND, bowel sounds + Extremities: no edema, no  cyanosis    The results of significant diagnostics from this hospitalization (including imaging, microbiology, ancillary and laboratory) are listed below for reference.     Microbiology: Recent Results (from the past 240 hour(s))  Blood culture (routine x 2)     Status: None (Preliminary result)   Collection Time: 10/25/22  5:53 AM   Specimen: BLOOD  Result Value Ref Range Status   Specimen Description BLOOD SITE NOT SPECIFIED  Final   Special Requests   Final    BOTTLES DRAWN AEROBIC AND ANAEROBIC Blood Culture adequate volume   Culture   Final    NO GROWTH 4 DAYS Performed at [facility], [address] [place], [place] [address]    Report Status PENDING  Incomplete  Blood culture (routine x 2)     Status: None (Preliminary result)   Collection Time: 10/25/22 11:11 AM   Specimen: BLOOD  Result Value Ref Range Status   Specimen Description BLOOD SITE NOT SPECIFIED  Final   Special Requests   Final    BOTTLES DRAWN AEROBIC AND ANAEROBIC Blood Culture adequate volume   Culture   Final    NO GROWTH 4 DAYS Performed at [facility], [address], [place], [place] [address]    Report Status PENDING  Incomplete  Respiratory (~20 pathogens) panel by PCR     Status: Abnormal   Collection Time: 10/25/22  4:57 PM   Specimen: Nasopharyngeal Swab; Respiratory  Result Value Ref Range Status   Adenovirus NOT DETECTED NOT DETECTED Final   Coronavirus 229E NOT DETECTED NOT DETECTED Final    Comment: (NOTE) The Coronavirus on the Respiratory Panel, DOES NOT test for the novel  Coronavirus (2019 nCoV)    Coronavirus HKU1 NOT DETECTED NOT DETECTED Final   Coronavirus NL63 NOT DETECTED NOT DETECTED Final   Coronavirus OC43 NOT DETECTED NOT DETECTED Final   Metapneumovirus NOT DETECTED NOT DETECTED Final   Rhinovirus / Enterovirus DETECTED (A) NOT DETECTED Final   Influenza A NOT DETECTED NOT DETECTED Final   Influenza B NOT DETECTED NOT DETECTED Final   Parainfluenza  Virus 1 NOT DETECTED NOT DETECTED Final   Parainfluenza Virus 2 NOT DETECTED NOT DETECTED Final   Parainfluenza Virus 3 NOT DETECTED NOT DETECTED Final   Parainfluenza Virus 4 NOT DETECTED NOT DETECTED Final   Respiratory Syncytial Virus NOT DETECTED NOT DETECTED Final   Bordetella pertussis NOT DETECTED NOT DETECTED Final   Bordetella  Parapertussis NOT DETECTED NOT DETECTED Final   Chlamydophila pneumoniae NOT DETECTED NOT DETECTED Final   Mycoplasma pneumoniae NOT DETECTED NOT DETECTED Final    Comment: Performed at [facility], [place] [address], [place],  [address]     Labs: BNP (last 3 results) Recent Labs    08/30/22 2341 09/17/22 0143 10/24/22 1530  BNP 1,902.1* 2,032.4* >6,270.3*   Basic Metabolic Panel: Recent Labs  Lab 10/24/22 1513 10/25/22 0527 10/26/22 0404 10/27/22 0426 10/28/22 0432  NA 139 134* 135 134* 133*  K 6.2* 4.9 5.6* 4.5 4.6  CL 98  --  96* 94* 95*  CO2 20*  --  21* 23 22  GLUCOSE 109*  --  106* 93 97  BUN 131*  --  99* 64* 77*  CREATININE 16.54*  --  13.00* 9.53* 10.64*  CALCIUM 8.5*  --  8.9 8.9 9.1  PHOS  --   --   --  7.7* 8.4*   Liver Function Tests: Recent Labs  Lab 10/24/22 1513 10/26/22 0404 10/27/22 0426 10/28/22 0432  AST 27 23  --   --   ALT 28 21  --   --   ALKPHOS 54 50  --   --   BILITOT 0.6 0.6  --   --   PROT 6.9 6.6  --   --   ALBUMIN 2.7* 2.4* 2.2* 2.2*   No results for input(s): "LIPASE", "AMYLASE" in the last 168 hours. No results for input(s): "AMMONIA" in the last 168 hours. CBC: Recent Labs  Lab 10/24/22 1513 10/25/22 0527 10/26/22 0404 10/27/22 0426 10/28/22 0432  WBC 10.7*  --  12.6* 10.7* 9.3  NEUTROABS 9.1*  --   --   --   --   HGB 9.4* 8.8* 9.0* 8.0* 8.4*  HCT 29.8* 26.0* 29.6* 25.5* 27.8*  MCV 99.3  --  100.3* 98.8 98.9  PLT 173  --  170 173 223   Cardiac Enzymes: No results for input(s): "CKTOTAL", "CKMB", "CKMBINDEX", "TROPONINI" in the last 168 hours. BNP: Invalid input(s):  "POCBNP" CBG: No results for input(s): "GLUCAP" in the last 168 hours. D-Dimer No results for input(s): "DDIMER" in the last 72 hours. Hgb A1c No results for input(s): "HGBA1C" in the last 72 hours. Lipid Profile No results for input(s): "CHOL", "HDL", "LDLCALC", "TRIG", "CHOLHDL", "LDLDIRECT" in the last 72 hours. Thyroid function studies No results for input(s): "TSH", "T4TOTAL", "T3FREE", "THYROIDAB" in the last 72 hours.  Invalid input(s): "FREET3" Anemia work up No results for input(s): "VITAMINB12", "FOLATE", "FERRITIN", "TIBC", "IRON", "RETICCTPCT" in the last 72 hours. Urinalysis    Component Value Date/Time   COLORURINE YELLOW 06/02/2021 1221   APPEARANCEUR CLOUDY (A) 06/02/2021 1221   LABSPEC 1.010 06/02/2021 1221   PHURINE 7.0 06/02/2021 1221   GLUCOSEU 50 (A) 06/02/2021 1221   HGBUR NEGATIVE 06/02/2021 1221   BILIRUBINUR NEGATIVE 06/02/2021 1221   BILIRUBINUR negative 01/30/2020 1526   BILIRUBINUR neg 09/15/2017 1657   KETONESUR NEGATIVE 06/02/2021 1221   PROTEINUR 100 (A) 06/02/2021 1221   UROBILINOGEN 0.2 01/30/2020 1526   NITRITE NEGATIVE 06/02/2021 1221   LEUKOCYTESUR NEGATIVE 06/02/2021 1221   Sepsis Labs Recent Labs  Lab 10/24/22 1513 10/26/22 0404 10/27/22 0426 10/28/22 0432  WBC 10.7* 12.6* 10.7* 9.3   Microbiology Recent Results (from the past 240 hour(s))  Blood culture (routine x 2)     Status: None (Preliminary result)   Collection Time: 10/25/22  5:53 AM   Specimen: BLOOD  Result Value Ref  Range Status   Specimen Description BLOOD SITE NOT SPECIFIED  Final   Special Requests   Final    BOTTLES DRAWN AEROBIC AND ANAEROBIC Blood Culture adequate volume   Culture   Final    NO GROWTH 4 DAYS Performed at [facility], [address], [place], [place] [address]    Report Status PENDING  Incomplete  Blood culture (routine x 2)     Status: None (Preliminary result)   Collection Time: 10/25/22 11:11 AM   Specimen: BLOOD  Result Value  Ref Range Status   Specimen Description BLOOD SITE NOT SPECIFIED  Final   Special Requests   Final    BOTTLES DRAWN AEROBIC AND ANAEROBIC Blood Culture adequate volume   Culture   Final    NO GROWTH 4 DAYS Performed at [facility], [address], [place], [place] [address]    Report Status PENDING  Incomplete  Respiratory (~20 pathogens) panel by PCR     Status: Abnormal   Collection Time: 10/25/22  4:57 PM   Specimen: Nasopharyngeal Swab; Respiratory  Result Value Ref Range Status   Adenovirus NOT DETECTED NOT DETECTED Final   Coronavirus 229E NOT DETECTED NOT DETECTED Final    Comment: (NOTE) The Coronavirus on the Respiratory Panel, DOES NOT test for the novel  Coronavirus (2019 nCoV)    Coronavirus HKU1 NOT DETECTED NOT DETECTED Final   Coronavirus NL63 NOT DETECTED NOT DETECTED Final   Coronavirus OC43 NOT DETECTED NOT DETECTED Final   Metapneumovirus NOT DETECTED NOT DETECTED Final   Rhinovirus / Enterovirus DETECTED (A) NOT DETECTED Final   Influenza A NOT DETECTED NOT DETECTED Final   Influenza B NOT DETECTED NOT DETECTED Final   Parainfluenza Virus 1 NOT DETECTED NOT DETECTED Final   Parainfluenza Virus 2 NOT DETECTED NOT DETECTED Final   Parainfluenza Virus 3 NOT DETECTED NOT DETECTED Final   Parainfluenza Virus 4 NOT DETECTED NOT DETECTED Final   Respiratory Syncytial Virus NOT DETECTED NOT DETECTED Final   Bordetella pertussis NOT DETECTED NOT DETECTED Final   Bordetella Parapertussis NOT DETECTED NOT DETECTED Final   Chlamydophila pneumoniae NOT DETECTED NOT DETECTED Final   Mycoplasma pneumoniae NOT DETECTED NOT DETECTED Final    Comment: Performed at [facility], [place]. [address] [place], [place] [address]    SIGNED:   Charlynne Cousins, MD  [facility] 10/29/2022, 10:01 AM Pager   If 7PM-7AM, please contact night-coverage [email or web address] Password [email or web address]

## 2022-10-29 NOTE — Progress Notes (Signed)
D/C order noted. Contacted Deon with [facility] [phone]) to make her aware that pt will d/c today and need to resume at out-pt HD clinic [facility]) tomorrow. Deon advised of pt's schedule on Tuesday (MWF 11:30 arrival for 11:45 chair time). Spoke to clinic manager at clinic to make her aware of pt's d/c today and that pt will resume care tomorrow. Clinic agreeable to plan. Update provided to RN CM and CSW as well via secure chat. Out-pt HD appts added to AVS.   Melven Sartorius Renal Navigator [phone]

## 2022-10-30 LAB — CULTURE, BLOOD (ROUTINE X 2)
Culture: NO GROWTH
Culture: NO GROWTH
Special Requests: ADEQUATE
Special Requests: ADEQUATE

## 2022-11-01 ENCOUNTER — Inpatient Hospital Stay (HOSPITAL_COMMUNITY)
Admission: EM | Admit: 2022-11-01 | Discharge: 2022-11-11 | DRG: 640 | Payer: Medicaid Other | Attending: Internal Medicine | Admitting: Internal Medicine

## 2022-11-01 ENCOUNTER — Emergency Department (HOSPITAL_COMMUNITY): Payer: Medicaid Other

## 2022-11-01 ENCOUNTER — Encounter (HOSPITAL_COMMUNITY): Payer: Self-pay

## 2022-11-01 DIAGNOSIS — E8770 Fluid overload, unspecified: Secondary | ICD-10-CM | POA: Diagnosis present

## 2022-11-01 DIAGNOSIS — E669 Obesity, unspecified: Secondary | ICD-10-CM | POA: Diagnosis present

## 2022-11-01 DIAGNOSIS — I132 Hypertensive heart and chronic kidney disease with heart failure and with stage 5 chronic kidney disease, or end stage renal disease: Secondary | ICD-10-CM | POA: Diagnosis present

## 2022-11-01 DIAGNOSIS — Z833 Family history of diabetes mellitus: Secondary | ICD-10-CM

## 2022-11-01 DIAGNOSIS — E8779 Other fluid overload: Principal | ICD-10-CM | POA: Diagnosis present

## 2022-11-01 DIAGNOSIS — I25110 Atherosclerotic heart disease of native coronary artery with unstable angina pectoris: Secondary | ICD-10-CM | POA: Diagnosis present

## 2022-11-01 DIAGNOSIS — Z91158 Patient's noncompliance with renal dialysis for other reason: Secondary | ICD-10-CM

## 2022-11-01 DIAGNOSIS — I10 Essential (primary) hypertension: Secondary | ICD-10-CM | POA: Diagnosis present

## 2022-11-01 DIAGNOSIS — G8929 Other chronic pain: Secondary | ICD-10-CM | POA: Diagnosis present

## 2022-11-01 DIAGNOSIS — I5043 Acute on chronic combined systolic (congestive) and diastolic (congestive) heart failure: Secondary | ICD-10-CM | POA: Diagnosis present

## 2022-11-01 DIAGNOSIS — J45909 Unspecified asthma, uncomplicated: Secondary | ICD-10-CM | POA: Diagnosis present

## 2022-11-01 DIAGNOSIS — N2581 Secondary hyperparathyroidism of renal origin: Secondary | ICD-10-CM | POA: Diagnosis present

## 2022-11-01 DIAGNOSIS — I5031 Acute diastolic (congestive) heart failure: Secondary | ICD-10-CM | POA: Diagnosis present

## 2022-11-01 DIAGNOSIS — Z87441 Personal history of nephrotic syndrome: Secondary | ICD-10-CM

## 2022-11-01 DIAGNOSIS — Z91148 Patient's other noncompliance with medication regimen for other reason: Secondary | ICD-10-CM

## 2022-11-01 DIAGNOSIS — R072 Precordial pain: Principal | ICD-10-CM

## 2022-11-01 DIAGNOSIS — Z8709 Personal history of other diseases of the respiratory system: Secondary | ICD-10-CM

## 2022-11-01 DIAGNOSIS — Z8249 Family history of ischemic heart disease and other diseases of the circulatory system: Secondary | ICD-10-CM

## 2022-11-01 DIAGNOSIS — F4310 Post-traumatic stress disorder, unspecified: Secondary | ICD-10-CM | POA: Diagnosis present

## 2022-11-01 DIAGNOSIS — I059 Rheumatic mitral valve disease, unspecified: Secondary | ICD-10-CM | POA: Diagnosis present

## 2022-11-01 DIAGNOSIS — I083 Combined rheumatic disorders of mitral, aortic and tricuspid valves: Secondary | ICD-10-CM | POA: Diagnosis present

## 2022-11-01 DIAGNOSIS — Z9049 Acquired absence of other specified parts of digestive tract: Secondary | ICD-10-CM

## 2022-11-01 DIAGNOSIS — I272 Pulmonary hypertension, unspecified: Secondary | ICD-10-CM | POA: Diagnosis present

## 2022-11-01 DIAGNOSIS — F603 Borderline personality disorder: Secondary | ICD-10-CM | POA: Diagnosis present

## 2022-11-01 DIAGNOSIS — F332 Major depressive disorder, recurrent severe without psychotic features: Secondary | ICD-10-CM | POA: Diagnosis present

## 2022-11-01 DIAGNOSIS — Z6832 Body mass index (BMI) 32.0-32.9, adult: Secondary | ICD-10-CM

## 2022-11-01 DIAGNOSIS — Z1152 Encounter for screening for COVID-19: Secondary | ICD-10-CM

## 2022-11-01 DIAGNOSIS — Z992 Dependence on renal dialysis: Secondary | ICD-10-CM

## 2022-11-01 DIAGNOSIS — G629 Polyneuropathy, unspecified: Secondary | ICD-10-CM | POA: Diagnosis present

## 2022-11-01 DIAGNOSIS — I5033 Acute on chronic diastolic (congestive) heart failure: Secondary | ICD-10-CM | POA: Diagnosis present

## 2022-11-01 DIAGNOSIS — Z79899 Other long term (current) drug therapy: Secondary | ICD-10-CM

## 2022-11-01 DIAGNOSIS — I5041 Acute combined systolic (congestive) and diastolic (congestive) heart failure: Secondary | ICD-10-CM | POA: Diagnosis present

## 2022-11-01 DIAGNOSIS — N189 Chronic kidney disease, unspecified: Secondary | ICD-10-CM | POA: Diagnosis present

## 2022-11-01 DIAGNOSIS — D631 Anemia in chronic kidney disease: Secondary | ICD-10-CM | POA: Diagnosis present

## 2022-11-01 DIAGNOSIS — I160 Hypertensive urgency: Secondary | ICD-10-CM | POA: Diagnosis present

## 2022-11-01 DIAGNOSIS — I429 Cardiomyopathy, unspecified: Secondary | ICD-10-CM | POA: Diagnosis present

## 2022-11-01 DIAGNOSIS — D509 Iron deficiency anemia, unspecified: Secondary | ICD-10-CM | POA: Diagnosis present

## 2022-11-01 DIAGNOSIS — I5032 Chronic diastolic (congestive) heart failure: Secondary | ICD-10-CM | POA: Diagnosis present

## 2022-11-01 DIAGNOSIS — E440 Moderate protein-calorie malnutrition: Secondary | ICD-10-CM | POA: Diagnosis present

## 2022-11-01 DIAGNOSIS — R7989 Other specified abnormal findings of blood chemistry: Secondary | ICD-10-CM | POA: Diagnosis present

## 2022-11-01 DIAGNOSIS — R079 Chest pain, unspecified: Secondary | ICD-10-CM | POA: Diagnosis present

## 2022-11-01 DIAGNOSIS — J9601 Acute respiratory failure with hypoxia: Secondary | ICD-10-CM | POA: Diagnosis present

## 2022-11-01 DIAGNOSIS — Z20822 Contact with and (suspected) exposure to covid-19: Secondary | ICD-10-CM | POA: Diagnosis present

## 2022-11-01 DIAGNOSIS — Z888 Allergy status to other drugs, medicaments and biological substances status: Secondary | ICD-10-CM

## 2022-11-01 DIAGNOSIS — R000 Tachycardia, unspecified: Secondary | ICD-10-CM | POA: Diagnosis present

## 2022-11-01 DIAGNOSIS — E8889 Other specified metabolic disorders: Secondary | ICD-10-CM | POA: Diagnosis present

## 2022-11-01 DIAGNOSIS — N186 End stage renal disease: Secondary | ICD-10-CM | POA: Diagnosis present

## 2022-11-01 DIAGNOSIS — I340 Nonrheumatic mitral (valve) insufficiency: Secondary | ICD-10-CM | POA: Diagnosis present

## 2022-11-01 DIAGNOSIS — Z91199 Patient's noncompliance with other medical treatment and regimen due to unspecified reason: Secondary | ICD-10-CM

## 2022-11-01 DIAGNOSIS — I3139 Other pericardial effusion (noninflammatory): Secondary | ICD-10-CM | POA: Diagnosis present

## 2022-11-01 DIAGNOSIS — I32 Pericarditis in diseases classified elsewhere: Secondary | ICD-10-CM | POA: Diagnosis present

## 2022-11-01 DIAGNOSIS — F17210 Nicotine dependence, cigarettes, uncomplicated: Secondary | ICD-10-CM | POA: Diagnosis present

## 2022-11-01 HISTORY — DX: Heart failure, unspecified: I50.9

## 2022-11-01 NOTE — ED Triage Notes (Signed)
Patient arrived with complaints of chest pain that started three days ago. Complaints of shortness of breath that worsens when she lays down. Receives dialysis MWF

## 2022-11-01 NOTE — ED Provider Triage Note (Signed)
Emergency Medicine Provider Triage Evaluation Note  Natasha Long , a 26 y.o. female  was evaluated in triage.  Pt complains of chest pain and shortness of breath. Presents from jail. Receives dialysis MWF, completed a session last Friday. States she feels overloaded. Also endorses cough and congestion as well. States her pain is located in the center of her chest and does not move. Has been ongoing for 4 days.  Review of Systems  Positive:  Negative:   Physical Exam  BP (!) 151/100 (BP Location: Right Arm)   Pulse (!) 106   Temp 98.8 F (37.1 C) (Oral)   Resp 16   Ht 5\' 8"  (1.727 m)   Wt 91.6 kg   LMP  (LMP Unknown) Comment: Patient notes she is not having periods (10/04/22)  SpO2 96%   BMI 30.71 kg/m  Gen:   Awake, no distress   Resp:  Normal effort  MSK:   Moves extremities without difficulty  Other:    Medical Decision Making  Medically screening exam initiated at 11:39 PM.  Appropriate orders placed.  Shiya Strawder was informed that the remainder of the evaluation will be completed by another provider, this initial triage assessment does not replace that evaluation, and the importance of remaining in the ED until their evaluation is complete.     Bud Face, PA-C 11/01/22 2341

## 2022-11-02 ENCOUNTER — Other Ambulatory Visit: Payer: Self-pay

## 2022-11-02 DIAGNOSIS — N186 End stage renal disease: Secondary | ICD-10-CM | POA: Diagnosis not present

## 2022-11-02 DIAGNOSIS — F332 Major depressive disorder, recurrent severe without psychotic features: Secondary | ICD-10-CM | POA: Diagnosis not present

## 2022-11-02 DIAGNOSIS — I160 Hypertensive urgency: Secondary | ICD-10-CM

## 2022-11-02 DIAGNOSIS — E8770 Fluid overload, unspecified: Secondary | ICD-10-CM | POA: Diagnosis not present

## 2022-11-02 DIAGNOSIS — F603 Borderline personality disorder: Secondary | ICD-10-CM | POA: Diagnosis not present

## 2022-11-02 DIAGNOSIS — I083 Combined rheumatic disorders of mitral, aortic and tricuspid valves: Secondary | ICD-10-CM | POA: Diagnosis not present

## 2022-11-02 DIAGNOSIS — I3139 Other pericardial effusion (noninflammatory): Secondary | ICD-10-CM | POA: Diagnosis not present

## 2022-11-02 DIAGNOSIS — Z992 Dependence on renal dialysis: Secondary | ICD-10-CM | POA: Diagnosis not present

## 2022-11-02 DIAGNOSIS — D631 Anemia in chronic kidney disease: Secondary | ICD-10-CM | POA: Diagnosis not present

## 2022-11-02 DIAGNOSIS — J45909 Unspecified asthma, uncomplicated: Secondary | ICD-10-CM | POA: Diagnosis not present

## 2022-11-02 DIAGNOSIS — E8889 Other specified metabolic disorders: Secondary | ICD-10-CM | POA: Diagnosis not present

## 2022-11-02 DIAGNOSIS — I359 Nonrheumatic aortic valve disorder, unspecified: Secondary | ICD-10-CM

## 2022-11-02 DIAGNOSIS — N2581 Secondary hyperparathyroidism of renal origin: Secondary | ICD-10-CM | POA: Diagnosis not present

## 2022-11-02 DIAGNOSIS — J9601 Acute respiratory failure with hypoxia: Secondary | ICD-10-CM | POA: Diagnosis not present

## 2022-11-02 DIAGNOSIS — E440 Moderate protein-calorie malnutrition: Secondary | ICD-10-CM | POA: Diagnosis not present

## 2022-11-02 DIAGNOSIS — E8779 Other fluid overload: Secondary | ICD-10-CM | POA: Diagnosis present

## 2022-11-02 DIAGNOSIS — I132 Hypertensive heart and chronic kidney disease with heart failure and with stage 5 chronic kidney disease, or end stage renal disease: Secondary | ICD-10-CM | POA: Diagnosis not present

## 2022-11-02 DIAGNOSIS — I272 Pulmonary hypertension, unspecified: Secondary | ICD-10-CM | POA: Diagnosis not present

## 2022-11-02 DIAGNOSIS — I5043 Acute on chronic combined systolic (congestive) and diastolic (congestive) heart failure: Secondary | ICD-10-CM | POA: Diagnosis not present

## 2022-11-02 DIAGNOSIS — I25110 Atherosclerotic heart disease of native coronary artery with unstable angina pectoris: Secondary | ICD-10-CM | POA: Diagnosis not present

## 2022-11-02 DIAGNOSIS — I5022 Chronic systolic (congestive) heart failure: Secondary | ICD-10-CM

## 2022-11-02 DIAGNOSIS — Z20822 Contact with and (suspected) exposure to covid-19: Secondary | ICD-10-CM | POA: Diagnosis not present

## 2022-11-02 DIAGNOSIS — Z6832 Body mass index (BMI) 32.0-32.9, adult: Secondary | ICD-10-CM | POA: Diagnosis not present

## 2022-11-02 DIAGNOSIS — I32 Pericarditis in diseases classified elsewhere: Secondary | ICD-10-CM | POA: Diagnosis not present

## 2022-11-02 DIAGNOSIS — Z1152 Encounter for screening for COVID-19: Secondary | ICD-10-CM | POA: Diagnosis not present

## 2022-11-02 DIAGNOSIS — I429 Cardiomyopathy, unspecified: Secondary | ICD-10-CM | POA: Diagnosis not present

## 2022-11-02 DIAGNOSIS — R079 Chest pain, unspecified: Secondary | ICD-10-CM | POA: Insufficient documentation

## 2022-11-02 LAB — CBC
HCT: 28.1 % — ABNORMAL LOW (ref 36.0–46.0)
Hemoglobin: 8.5 g/dL — ABNORMAL LOW (ref 12.0–15.0)
MCH: 30.4 pg (ref 26.0–34.0)
MCHC: 30.2 g/dL (ref 30.0–36.0)
MCV: 100.4 fL — ABNORMAL HIGH (ref 80.0–100.0)
Platelets: 300 10*3/uL (ref 150–400)
RBC: 2.8 MIL/uL — ABNORMAL LOW (ref 3.87–5.11)
RDW: 19 % — ABNORMAL HIGH (ref 11.5–15.5)
WBC: 9.7 10*3/uL (ref 4.0–10.5)
nRBC: 0 % (ref 0.0–0.2)

## 2022-11-02 LAB — RESP PANEL BY RT-PCR (FLU A&B, COVID) ARPGX2
Influenza A by PCR: NEGATIVE
Influenza B by PCR: NEGATIVE
SARS Coronavirus 2 by RT PCR: NEGATIVE

## 2022-11-02 LAB — BASIC METABOLIC PANEL
Anion gap: 12 (ref 5–15)
BUN: 49 mg/dL — ABNORMAL HIGH (ref 6–20)
CO2: 27 mmol/L (ref 22–32)
Calcium: 9 mg/dL (ref 8.9–10.3)
Chloride: 100 mmol/L (ref 98–111)
Creatinine, Ser: 8.93 mg/dL — ABNORMAL HIGH (ref 0.44–1.00)
GFR, Estimated: 6 mL/min — ABNORMAL LOW (ref >60)
Glucose, Bld: 89 mg/dL (ref 70–99)
Potassium: 4.7 mmol/L (ref 3.5–5.1)
Sodium: 139 mmol/L (ref 135–145)

## 2022-11-02 LAB — HEPATITIS B SURFACE ANTIGEN: Hepatitis B Surface Ag: NONREACTIVE

## 2022-11-02 LAB — I-STAT BETA HCG BLOOD, ED (MC, WL, AP ONLY): I-stat hCG, quantitative: <5 m[IU]/mL (ref <5)

## 2022-11-02 LAB — TROPONIN I (HIGH SENSITIVITY)
Troponin I (High Sensitivity): 365 ng/L (ref <18)
Troponin I (High Sensitivity): 366 ng/L (ref <18)

## 2022-11-02 LAB — BRAIN NATRIURETIC PEPTIDE: B Natriuretic Peptide: 1485.8 pg/mL — ABNORMAL HIGH (ref 0.0–100.0)

## 2022-11-02 MED ORDER — DILTIAZEM HCL ER COATED BEADS 180 MG PO CP24
360.0000 mg | ORAL_CAPSULE | Freq: Every day | ORAL | Status: DC
  Administered 2022-11-02 – 2022-11-05 (×3): 360 mg via ORAL
  Filled 2022-11-02 (×3): qty 2
  Filled 2022-11-02: qty 3

## 2022-11-02 MED ORDER — LIDOCAINE HCL (PF) 1 % IJ SOLN: 5.0000 mL | INTRAMUSCULAR | Status: DC | PRN

## 2022-11-02 MED ORDER — HEPARIN SODIUM (PORCINE) 1000 UNIT/ML DIALYSIS: 1000.0000 [IU] | INTRAMUSCULAR | Status: DC | PRN

## 2022-11-02 MED ORDER — HYDROXYZINE HCL 10 MG PO TABS
25.0000 mg | ORAL_TABLET | Freq: Three times a day (TID) | ORAL | Status: DC | PRN
  Administered 2022-11-02 – 2022-11-11 (×17): 25 mg via ORAL
  Filled 2022-11-02 (×18): qty 3

## 2022-11-02 MED ORDER — CALCITRIOL 0.25 MCG PO CAPS
0.2500 ug | ORAL_CAPSULE | ORAL | Status: DC
  Administered 2022-11-04 – 2022-11-09 (×3): 0.25 ug via ORAL
  Filled 2022-11-02 (×3): qty 1

## 2022-11-02 MED ORDER — SODIUM CHLORIDE 0.9 % IV SOLN
125.0000 mg | INTRAVENOUS | Status: DC
  Administered 2022-11-04 – 2022-11-09 (×3): 125 mg via INTRAVENOUS
  Filled 2022-11-02 (×5): qty 10

## 2022-11-02 MED ORDER — LIDOCAINE-PRILOCAINE 2.5-2.5 % EX CREA: 1.0000 | TOPICAL_CREAM | CUTANEOUS | Status: DC | PRN

## 2022-11-02 MED ORDER — ACETAMINOPHEN 325 MG PO TABS
650.0000 mg | ORAL_TABLET | Freq: Four times a day (QID) | ORAL | Status: DC | PRN
  Administered 2022-11-02 – 2022-11-09 (×5): 650 mg via ORAL
  Filled 2022-11-02 (×6): qty 2

## 2022-11-02 MED ORDER — SEVELAMER CARBONATE 800 MG PO TABS
1600.0000 mg | ORAL_TABLET | Freq: Three times a day (TID) | ORAL | Status: DC
  Administered 2022-11-02 – 2022-11-11 (×19): 1600 mg via ORAL
  Filled 2022-11-02 (×20): qty 2

## 2022-11-02 MED ORDER — TRAZODONE HCL 50 MG PO TABS
100.0000 mg | ORAL_TABLET | Freq: Every day | ORAL | Status: DC
  Administered 2022-11-03 – 2022-11-10 (×9): 100 mg via ORAL
  Filled 2022-11-02: qty 2
  Filled 2022-11-02: qty 1
  Filled 2022-11-02 (×8): qty 2

## 2022-11-02 MED ORDER — TORSEMIDE 20 MG PO TABS
100.0000 mg | ORAL_TABLET | Freq: Every morning | ORAL | Status: DC
  Administered 2022-11-02 – 2022-11-10 (×7): 100 mg via ORAL
  Filled 2022-11-02 (×9): qty 5

## 2022-11-02 MED ORDER — ONDANSETRON HCL 4 MG/2ML IJ SOLN: 4.0000 mg | Freq: Four times a day (QID) | INTRAMUSCULAR | Status: DC | PRN

## 2022-11-02 MED ORDER — DICYCLOMINE HCL 20 MG PO TABS
20.0000 mg | ORAL_TABLET | Freq: Four times a day (QID) | ORAL | Status: DC | PRN
  Administered 2022-11-04: 20 mg via ORAL
  Filled 2022-11-02: qty 1

## 2022-11-02 MED ORDER — PENTAFLUOROPROP-TETRAFLUOROETH EX AERO: 1.0000 | INHALATION_SPRAY | CUTANEOUS | Status: DC | PRN

## 2022-11-02 MED ORDER — ISOSORBIDE MONONITRATE ER 30 MG PO TB24
30.0000 mg | ORAL_TABLET | Freq: Every day | ORAL | Status: DC
  Administered 2022-11-02 – 2022-11-05 (×3): 30 mg via ORAL
  Filled 2022-11-02 (×4): qty 1

## 2022-11-02 MED ORDER — ONDANSETRON HCL 4 MG PO TABS: 4.0000 mg | ORAL_TABLET | Freq: Four times a day (QID) | ORAL | Status: DC | PRN

## 2022-11-02 MED ORDER — HYDRALAZINE HCL 25 MG PO TABS
25.0000 mg | ORAL_TABLET | Freq: Three times a day (TID) | ORAL | Status: DC
  Administered 2022-11-02 – 2022-11-04 (×5): 25 mg via ORAL
  Filled 2022-11-02 (×6): qty 1

## 2022-11-02 MED ORDER — CALCIUM ACETATE (PHOS BINDER) 667 MG PO CAPS
667.0000 mg | ORAL_CAPSULE | Freq: Three times a day (TID) | ORAL | Status: DC
  Administered 2022-11-02 – 2022-11-11 (×18): 667 mg via ORAL
  Filled 2022-11-02 (×19): qty 1

## 2022-11-02 MED ORDER — CHLORHEXIDINE GLUCONATE CLOTH 2 % EX PADS
6.0000 | MEDICATED_PAD | Freq: Every day | CUTANEOUS | Status: DC
  Administered 2022-11-07 – 2022-11-10 (×3): 6 via TOPICAL

## 2022-11-02 MED ORDER — ARIPIPRAZOLE 5 MG PO TABS
7.5000 mg | ORAL_TABLET | Freq: Every day | ORAL | Status: DC
  Administered 2022-11-02 – 2022-11-11 (×9): 7.5 mg via ORAL
  Filled 2022-11-02 (×11): qty 2

## 2022-11-02 MED ORDER — ACETAMINOPHEN 650 MG RE SUPP: 650.0000 mg | Freq: Four times a day (QID) | RECTAL | Status: DC | PRN

## 2022-11-02 MED ORDER — ANTICOAGULANT SODIUM CITRATE 4% (200MG/5ML) IV SOLN: 5.0000 mL | Status: DC | PRN

## 2022-11-02 MED ORDER — GABAPENTIN 300 MG PO CAPS: 300.0000 mg | ORAL_CAPSULE | Freq: Two times a day (BID) | ORAL | Status: DC

## 2022-11-02 MED ORDER — DARBEPOETIN ALFA 100 MCG/0.5ML IJ SOSY
100.0000 ug | PREFILLED_SYRINGE | INTRAMUSCULAR | Status: DC
  Administered 2022-11-02 – 2022-11-09 (×2): 100 ug via SUBCUTANEOUS
  Filled 2022-11-02 (×2): qty 0.5

## 2022-11-02 MED ORDER — ALTEPLASE 2 MG IJ SOLR: 2.0000 mg | Freq: Once | INTRAMUSCULAR | Status: DC | PRN

## 2022-11-02 MED ORDER — METHOCARBAMOL 500 MG PO TABS
500.0000 mg | ORAL_TABLET | Freq: Three times a day (TID) | ORAL | Status: DC | PRN
  Administered 2022-11-03 – 2022-11-09 (×9): 500 mg via ORAL
  Filled 2022-11-02 (×9): qty 1
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
## 2022-11-02 NOTE — ED Notes (Signed)
[facility] called. 

## 2022-11-02 NOTE — ED Provider Notes (Signed)
[facility] DEPT Provider Note   CSN: [id number] Arrival date & time: 11/01/22  2311     History  Chief Complaint  Patient presents with   Chest Pain    Natasha Long is a 26 y.o. female.  The history is provided by the patient.  Chest Pain Natasha Long is a 26 y.o. female who presents to the Emergency Department complaining of chest pain.  She presents to the emergency department complaining of 3 days of chest tightness, cough productive of yellow sputum as well as shortness of breath.  She has a temp to 99.  Her pain is worse with laying down and has difficulty breathing with laying supine.  She also complains of lower extremity edema.  She has ESRD and had her last dialysis session on Friday.  She did have a full session.  She also has a history of hypertension, pericardial effusion and severe mitral regurg.  She is compliant with her home medications.  She is currently incarcerated.  She had previously been using cocaine but she has not used for 1 to 2 weeks.    Home Medications Prior to Admission medications   Medication Sig Start Date End Date Taking? Authorizing Provider  ARIPiprazole (ABILIFY) 5 MG tablet Take 1.5 tablets (7.5 mg total) by mouth daily. Patient taking differently: Take 10 mg by mouth daily. 09/02/22   Rai, Ripudeep K, MD  b complex-vitamin c-folic acid (NEPHRO-VITE) 0.8 MG TABS tablet Take 1 tablet by mouth daily. 09/02/22   Rai, Vernelle Emerald, MD  calcitRIOL (ROCALTROL) 0.25 MCG capsule Take 1 capsule (0.25 mcg total) by mouth Every Tuesday,Thursday,and Saturday with dialysis. Patient taking differently: Take 0.25 mcg by mouth every Monday, Wednesday, and Friday with hemodialysis. 09/03/22   Rai, Vernelle Emerald, MD  calcium acetate (PHOSLO) 667 MG capsule Take 1 capsule (667 mg total) by mouth 3 (three) times daily with meals. 09/02/22   Rai, Vernelle Emerald, MD  dicyclomine (BENTYL) 20 MG tablet Take 1 tablet (20 mg total) by mouth every  6 (six) hours as needed for spasms (abdominal cramping). 09/02/22   Rai, Vernelle Emerald, MD  diltiazem (CARDIZEM CD) 360 MG 24 hr capsule Take 1 capsule (360 mg total) by mouth daily. 10/29/22 10/29/23  Charlynne Cousins, MD  gabapentin (NEURONTIN) 300 MG capsule Take 1 capsule (300 mg total) by mouth 2 (two) times daily. 09/02/22   Rai, Vernelle Emerald, MD  hydrOXYzine (VISTARIL) 25 MG capsule Take 1 capsule (25 mg total) by mouth every 8 (eight) hours as needed for anxiety. Patient taking differently: Take 50 mg by mouth every 8 (eight) hours as needed for anxiety. 09/02/22   Rai, Vernelle Emerald, MD  methocarbamol (ROBAXIN) 500 MG tablet Take 1 tablet (500 mg total) by mouth every 8 (eight) hours as needed for muscle spasms. 09/02/22   Rai, Vernelle Emerald, MD  sevelamer carbonate (RENVELA) 800 MG tablet Take 2 tablets (1,600 mg total) by mouth 3 (three) times daily with meals. 09/02/22   Rai, Ripudeep Raliegh Ip, MD  torsemide (DEMADEX) 100 MG tablet Take 1 tablet (100 mg total) by mouth every morning. 09/02/22   Rai, Vernelle Emerald, MD  traZODone (DESYREL) 100 MG tablet Take 1 tablet (100 mg total) by mouth at bedtime. 09/02/22   Rai, Vernelle Emerald, MD      Allergies    Bupropion, Prozac [fluoxetine hcl], and Prednisone    Review of Systems   Review of Systems  Cardiovascular:  Positive for chest pain.  All other  systems reviewed and are negative.   Physical Exam Updated Vital Signs BP (!) 142/93   Pulse (!) 113   Temp 98.5 F (36.9 C) (Oral)   Resp 20   Ht 5\' 8"  (1.727 m)   Wt 91.6 kg   LMP  (LMP Unknown) Comment: Patient notes she is not having periods (10/04/22)  SpO2 100%   BMI 30.71 kg/m  Physical Exam Vitals and nursing note reviewed.  Constitutional:      Appearance: She is well-developed.  HENT:     Head: Normocephalic and atraumatic.  Cardiovascular:     Rate and Rhythm: Regular rhythm. Tachycardia present.     Heart sounds: Murmur heard.  Pulmonary:     Effort: Pulmonary effort is normal. No  respiratory distress.     Comments: Decreased air movement in the right lung base Abdominal:     Palpations: Abdomen is soft.     Tenderness: There is no abdominal tenderness. There is no guarding or rebound.  Musculoskeletal:        General: No tenderness.     Comments: Trace to 1+ pitting edema to bilateral lower extremities.  Fistula in the left upper extremity with palpable thrill  Skin:    General: Skin is warm and dry.  Neurological:     Mental Status: She is alert and oriented to person, place, and time.  Psychiatric:        Behavior: Behavior normal.     ED Results / Procedures / Treatments   Labs (all labs ordered are listed, but only abnormal results are displayed) Labs Reviewed  BASIC METABOLIC PANEL - Abnormal; Notable for the following components:      Result Value   BUN 49 (*)    Creatinine, Ser 8.93 (*)    GFR, Estimated 6 (*)    All other components within normal limits  CBC - Abnormal; Notable for the following components:   RBC 2.80 (*)    Hemoglobin 8.5 (*)    HCT 28.1 (*)    MCV 100.4 (*)    RDW 19.0 (*)    All other components within normal limits  BRAIN NATRIURETIC PEPTIDE - Abnormal; Notable for the following components:   B Natriuretic Peptide 1,485.8 (*)    All other components within normal limits  TROPONIN I (HIGH SENSITIVITY) - Abnormal; Notable for the following components:   Troponin I (High Sensitivity) 365 (*)    All other components within normal limits  TROPONIN I (HIGH SENSITIVITY) - Abnormal; Notable for the following components:   Troponin I (High Sensitivity) 366 (*)    All other components within normal limits  RESP PANEL BY RT-PCR (FLU A&B, COVID) ARPGX2  I-STAT BETA HCG BLOOD, ED ([facility], [facility], [facility] ONLY)    EKG EKG Interpretation  Date/Time:  Sunday November 01 2022 23:33:16 EST Ventricular Rate:  105 PR Interval:  141 QRS Duration: 90 QT Interval:  346 QTC Calculation: 458 R Axis:   16 Text Interpretation: Sinus tachycardia  Low voltage, extremity and precordial leads Confirmed by Quintella Reichert [phone]) on 11/02/2022 4:09:46 AM  Radiology DG Chest 2 View  Result Date: 11/02/2022 CLINICAL DATA:  Dialysis patient with shortness of breath. Chest pain. Cough and congestion. EXAM: CHEST - 2 VIEW COMPARISON:  Portable chest and chest CT both 10/26/2023 FINDINGS: Moderately enlarged heart silhouette, on CT was noted in part due to a sizable pericardial effusion which is probably still present. Central vascular prominence is again seen without overt edema. A moderately large  right and small left pleural effusions are again shown. Overlying atelectasis or consolidation is again noted in the right-greater-than-left lower lung fields. Some of the fluid tracks into the horizontal fissure on the right. The remaining lungs are clear. Overall aeration seems unchanged. The pleural effusions were similar in appearance previously. IMPRESSION: 1. Moderately large heart silhouette, on CT was noted in part due to a sizable pericardial effusion which is probably still present. 2. Central vascular prominence without overt edema. 3. Moderately large right and small left pleural effusions with overlying atelectasis or consolidation. Similar appearance on 10/25/2022. Electronically Signed   By: Telford Nab M.D.   On: 11/02/2022 00:11    Procedures Procedures    Medications Ordered in ED Medications - No data to display  ED Course/ Medical Decision Making/ A&P                           Medical Decision Making Amount and/or Complexity of Data Reviewed Labs: ordered. Radiology: ordered.  Risk Decision regarding hospitalization.   Patient with history of ESRD on hemodialysis here for evaluation of chest pain/tightness, shortness of breath.  She is tachycardic on evaluation.  Imaging is significant for cardiomegaly consistent with known pericardial effusion as well as bilateral pleural effusions, right greater than left.  Images  personally reviewed and interpreted, agree with radiologist interpretation.  Troponins are elevated to the 300s and flat-this is downtrending when compared to recent hospitalization.  BMP is consistent with end-stage renal disease without concerning electrolyte abnormality.  CBC with stable anemia.  Given patient's tachycardia, chest pain, known pericardial effusion as well as severe mitral regurg with worsening pleural effusion feel she will benefit from inpatient admission for ongoing dialysis and cardiac evaluation.  Medicine consulted for admission for ongoing care.  Patient updated of findings of studies and she is in agreement with treatment for ongoing care.        Final Clinical Impression(s) / ED Diagnoses Final diagnoses:  None    Rx / DC Orders ED Discharge Orders     None         Quintella Reichert, MD 11/02/22 [phone]

## 2022-11-02 NOTE — Progress Notes (Signed)
NEW ADMISSION NOTE New Admission Note:  Patient to floor. Room air, follows directions. Patient accompanied by officer. Patient in no distress at this time and asking for food.  Call light within reach. Arrival Method: [facility] Mental Orientation: A & O x 4 Telemetry: Yes Assessment: Completed Skin: Intact IV: Rt forearm Pain: No pain at this time Tubes: Safety Measures: Safety Fall Prevention Plan has been given, discussed and implemented. Admission: Completed [facility] Orientation: Patient has been orientated to the room, unit and staff.  Family:  Orders have been reviewed and implemented. Will continue to monitor the patient. Call light has been placed within reach and bed alarm has been activated.   Keenan Bachelor, RN

## 2022-11-02 NOTE — ED Notes (Signed)
Pt ambulated down the hall and back to room. Pt O2 sats ranged from 89 to 93%. Pt stated she felt extremely SOB. Pt requested to walk back to the room before reaching end of hall because she felt like she was going to pass out. Pt was tachycardic with pulse reaching 125.
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
## 2022-11-02 NOTE — Consult Note (Addendum)
[facility]  INPATIENT CONSULTATION  Reason for Consultation: ESRD Requesting Provider: Dr. Ralene Bathe  HPI: Natasha Long is an 26 y.o. female with ESRD, HTN, LVH currently presenting with chest pain and nephrology is consulted for co management of volume, BP and ESRD.   Pt with recent admission for volume overload and chest pain 11/26-30.  Presented to ED from jail again today with similar complaints.  CXR with large cardiac silhouette (known effusion), R > L pleural effusions (similar to last admission), congestion without edema - on RA.  Trop elevated at baseline, BNP less than recents (1485 vs >4500 last admit). Cardiology consulted in the ED and recommended aggressive dialysis and outpt nuc stress test, repeat TTE.  On my assessment she's drowsy but asking for pain meds.   She had last outpt HD 12/1 and ran 4hrs.  EDW 92kg and post wt 90.5kg.  She admits to eating less while incarcerated.  She denies breaking fluid restriction.    PMH: Past Medical History:  Diagnosis Date   Asthma    as a child, no problem as an adult, no inhaler   CHF (congestive heart failure) (HCC)    Complication of anesthesia    woke up before tube removed, 1 time fought nurses   ESRD on hemodialysis [facility])    M-W-F   History of borderline personality disorder    Hypertension    diagnosed as child; stopped meds at 56 yo   Insomnia    Neuromuscular disorder ([place])    peripheral neuropathy   Nonspecific chest pain    PTSD (post-traumatic stress disorder)    PSH: Past Surgical History:  Procedure Laterality Date   AV FISTULA PLACEMENT Left 10/18/2020   Procedure: LEFT ARM ARTERIOVENOUS (AV) FISTULA CREATION;  Surgeon: Serafina Mitchell, MD;  Location: [facility];  Service: Vascular;  Laterality: Left;   CHOLECYSTECTOMY     extraction of wisdom teeth     FISTULA SUPERFICIALIZATION Left 02/13/2021   Procedure: LEFT BRACHIOCEPHALIC ARTERIOVENOUS FISTULA SUPERFICIALIZATION;  Surgeon: Serafina Mitchell,  MD;  Location: [place];  Service: Vascular;  Laterality: Left;   I & D EXTREMITY Left 01/30/2022   Procedure: IRRIGATION AND DEBRIDEMENT EXTREMITY;  Surgeon: Milly Jakob, MD;  Location: [place];  Service: Orthopedics;  Laterality: Left;   INCISION AND DRAINAGE OF WOUND Left 01/30/2022   Procedure: LEFT WRIST ASPIRATION;  Surgeon: Vanetta Mulders, MD;  Location: [place];  Service: Orthopedics;  Laterality: Left;   KNEE ARTHROSCOPY Right 01/30/2022   Procedure: ARTHROSCOPY KNEE AND IRRIIGATION AND DEBRIDMENT; LEFT WRIST ASPIRATION;  Surgeon: Vanetta Mulders, MD;  Location: [place];  Service: Orthopedics;  Laterality: Right;   RENAL BIOPSY     x 2   TUNNELED VENOUS CATHETER PLACEMENT  02/11/2021   [facility]     Past Medical History:  Diagnosis Date   Asthma    as a child, no problem as an adult, no inhaler   CHF (congestive heart failure) (HCC)    Complication of anesthesia    woke up before tube removed, 1 time fought nurses   ESRD on hemodialysis [facility])    M-W-F   History of borderline personality disorder    Hypertension    diagnosed as child; stopped meds at 44 yo   Insomnia    Neuromuscular disorder ([place])    peripheral neuropathy   Nonspecific chest pain    PTSD (post-traumatic stress disorder)     Medications:  Reviewed meds.   Medications Prior to Admission  Medication Sig Dispense Refill   acetaminophen (TYLENOL) 500 MG tablet Take 1,000 mg by mouth every 8 (eight) hours as needed for mild pain (x 7 days).     ARIPiprazole (ABILIFY) 5 MG tablet Take 1.5 tablets (7.5 mg total) by mouth daily. 45 tablet 1   b complex-vitamin c-folic acid (NEPHRO-VITE) 0.8 MG TABS tablet Take 1 tablet by mouth daily. 30 tablet 1   calcitRIOL (ROCALTROL) 0.25 MCG capsule Take 1 capsule (0.25 mcg total) by mouth Every Tuesday,Thursday,and Saturday with dialysis. (Patient taking differently: Take 0.25 mcg by mouth 3 (three) times a week.) 30 capsule 3   calcium acetate (PHOSLO) 667 MG capsule  Take 1 capsule (667 mg total) by mouth 3 (three) times daily with meals. 30 capsule 3   dicyclomine (BENTYL) 20 MG tablet Take 1 tablet (20 mg total) by mouth every 6 (six) hours as needed for spasms (abdominal cramping). 30 tablet 0   diltiazem (CARDIZEM CD) 360 MG 24 hr capsule Take 1 capsule (360 mg total) by mouth daily. 30 capsule 0   hydrOXYzine (VISTARIL) 25 MG capsule Take 1 capsule (25 mg total) by mouth every 8 (eight) hours as needed for anxiety. (Patient taking differently: Take 50 mg by mouth at bedtime.) 60 capsule 2   methocarbamol (ROBAXIN) 500 MG tablet Take 1 tablet (500 mg total) by mouth every 8 (eight) hours as needed for muscle spasms. 30 tablet 0   sevelamer carbonate (RENVELA) 800 MG tablet Take 2 tablets (1,600 mg total) by mouth 3 (three) times daily with meals. 180 tablet 0   torsemide (DEMADEX) 100 MG tablet Take 1 tablet (100 mg total) by mouth every morning. 30 tablet 3   gabapentin (NEURONTIN) 300 MG capsule Take 1 capsule (300 mg total) by mouth 2 (two) times daily. (Patient not taking: Reported on 11/02/2022) 60 capsule 1   traZODone (DESYREL) 100 MG tablet Take 1 tablet (100 mg total) by mouth at bedtime. (Patient not taking: Reported on 11/02/2022) 30 tablet 1    ALLERGIES:   Allergies  Allergen Reactions   Bupropion Anxiety, Other (See Comments) and Nausea And Vomiting    Caused panic attacks  Caused panic attacks    "Adverse effects"   Prozac [Fluoxetine Hcl] Anxiety and Other (See Comments)    Caused panic attacks   Prednisone Other (See Comments)    Pt stated this med caused pancreatitis    FAM HX: Family History  Adopted: Yes  Problem Relation Age of Onset   Diabetes Other    Hypertension Other     Social History:   reports that she has been smoking cigarettes. She has a 5.00 pack-year smoking history. She has never used smokeless tobacco. She reports that she does not currently use alcohol. She reports current drug use. Drugs: Marijuana and  Cocaine.  ROS: 12 system ROS neg except per HPI  Blood pressure (!) 141/98, pulse (!) 108, temperature 98.6 F (37 C), temperature source Oral, resp. rate 18, height 5\' 8"  (1.727 m), weight 91.6 kg, SpO2 97 %. PHYSICAL EXAM: Gen: obese woman lying flat in bed, drowsy but arousable, smiling  Eyes: anicteric ENT: MMM Neck: supple, thick CV:  tachy, regular, HS distant Abd: soft Lungs: dec BS in bases R > L - no rales appreciated; on RA Extr:  no edema, LUE AVR+t/b Neuro: nonfocal Skin:warm and dry   Results for orders placed or performed during the hospital encounter of 11/01/22 (from the past 48 hour(s))  Basic metabolic panel  Status: Abnormal   Collection Time: 11/02/22  2:39 AM  Result Value Ref Range   Sodium 139 135 - 145 mmol/L   Potassium 4.7 3.5 - 5.1 mmol/L   Chloride 100 98 - 111 mmol/L   CO2 27 22 - 32 mmol/L   Glucose, Bld 89 70 - 99 mg/dL    Comment: Glucose reference range applies only to samples taken after fasting for at least 8 hours.   BUN 49 (H) 6 - 20 mg/dL   Creatinine, Ser 8.93 (H) 0.44 - 1.00 mg/dL   Calcium 9.0 8.9 - 10.3 mg/dL   GFR, Estimated 6 (L) >60 mL/min    Comment: (NOTE) Calculated using the CKD-EPI Creatinine Equation (2021)    Anion gap 12 5 - 15    Comment: Performed at [facility], [place] [address], [place], [place] [address]  CBC     Status: Abnormal   Collection Time: 11/02/22  2:39 AM  Result Value Ref Range   WBC 9.7 4.0 - 10.5 K/uL   RBC 2.80 (L) 3.87 - 5.11 MIL/uL   Hemoglobin 8.5 (L) 12.0 - 15.0 g/dL   HCT 28.1 (L) 36.0 - 46.0 %   MCV 100.4 (H) 80.0 - 100.0 fL   MCH 30.4 26.0 - 34.0 pg   MCHC 30.2 30.0 - 36.0 g/dL   RDW 19.0 (H) 11.5 - 15.5 %   Platelets 300 150 - 400 K/uL   nRBC 0.0 0.0 - 0.2 %    Comment: Performed at [facility], [place] [address], [place], [place] [address]  Brain natriuretic peptide     Status: Abnormal   Collection Time: 11/02/22  2:39 AM  Result Value Ref Range    B Natriuretic Peptide 1,485.8 (H) 0.0 - 100.0 pg/mL    Comment: Performed at [facility], [place] [address] [place], [place] [address]  Troponin I (High Sensitivity)     Status: Abnormal   Collection Time: 11/02/22  2:39 AM  Result Value Ref Range   Troponin I (High Sensitivity) 365 (HH) <18 ng/L    Comment: CRITICAL RESULT CALLED TO, READ BACK BY AND VERIFIED WITH JAKE TALKINGTON RN ON 11/02/22 AT 0347 BY GOLSONM (NOTE) Elevated high sensitivity troponin I (hsTnI) values and significant  changes across serial measurements may suggest ACS but many other  chronic and acute conditions are known to elevate hsTnI results.  Refer to the "Links" section for chest pain algorithms and additional  guidance. Performed at [facility], [place] [address] [place], [place] [address]   I-Stat beta hCG blood, ED     Status: None   Collection Time: 11/02/22  2:44 AM  Result Value Ref Range   I-stat hCG, quantitative <5.0 <5 mIU/mL   Comment 3            Comment:   GEST. AGE      CONC.  (mIU/mL)   <=1 WEEK        5 - 50     2 WEEKS       50 - 500     3 WEEKS       100 - 10,000     4 WEEKS     1,000 - 30,000        FEMALE AND NON-PREGNANT FEMALE:     LESS THAN 5 mIU/mL   Troponin I (High Sensitivity)     Status: Abnormal   Collection Time: 11/02/22  4:24 AM  Result Value  Ref Range   Troponin I (High Sensitivity) 366 (HH) <18 ng/L    Comment: CRITICAL RESULT CALLED TO, READ BACK BY AND VERIFIED WITH GIBSON,K. 11/02/22 @0525  BY SEEL,M. (NOTE) Elevated high sensitivity troponin I (hsTnI) values and significant  changes across serial measurements may suggest ACS but many other  chronic and acute conditions are known to elevate hsTnI results.  Refer to the "Links" section for chest pain algorithms and additional  guidance. Performed at [facility], [place] [address], [place], [place] [address]   Resp Panel by RT-PCR (Flu A&B, Covid) Anterior  Nasal Swab     Status: None   Collection Time: 11/02/22  4:33 AM   Specimen: Anterior Nasal Swab  Result Value Ref Range   SARS Coronavirus 2 by RT PCR NEGATIVE NEGATIVE    Comment: (NOTE) SARS-CoV-2 target nucleic acids are NOT DETECTED.  The SARS-CoV-2 RNA is generally detectable in upper respiratory specimens during the acute phase of infection. The lowest concentration of SARS-CoV-2 viral copies this assay can detect is 138 copies/mL. A negative result does not preclude SARS-Cov-2 infection and should not be used as the sole basis for treatment or other patient management decisions. A negative result may occur with  improper specimen collection/handling, submission of specimen other than nasopharyngeal swab, presence of viral mutation(s) within the areas targeted by this assay, and inadequate number of viral copies(<138 copies/mL). A negative result must be combined with clinical observations, patient history, and epidemiological information. The expected result is Negative.  Fact Sheet for Patients:  EntrepreneurPulse.com.au  Fact Sheet for Healthcare Providers:  IncredibleEmployment.be  This test is no t yet approved or cleared by the [place] FDA and  has been authorized for detection and/or diagnosis of SARS-CoV-2 by FDA under an Emergency Use Authorization (EUA). This EUA will remain  in effect (meaning this test can be used) for the duration of the COVID-19 declaration under Section 564(b)(1) of the Act, 21 U.S.C.section 360bbb-3(b)(1), unless the authorization is terminated  or revoked sooner.       Influenza A by PCR NEGATIVE NEGATIVE   Influenza B by PCR NEGATIVE NEGATIVE    Comment: (NOTE) The Xpert Xpress SARS-CoV-2/FLU/RSV plus assay is intended as an aid in the diagnosis of influenza from Nasopharyngeal swab specimens and should not be used as a sole basis for treatment. Nasal washings and aspirates are unacceptable  for Xpert Xpress SARS-CoV-2/FLU/RSV testing.  Fact Sheet for Patients: EntrepreneurPulse.com.au  Fact Sheet for Healthcare Providers: IncredibleEmployment.be  This test is not yet approved or cleared by the [place] FDA and has been authorized for detection and/or diagnosis of SARS-CoV-2 by FDA under an Emergency Use Authorization (EUA). This EUA will remain in effect (meaning this test can be used) for the duration of the COVID-19 declaration under Section 564(b)(1) of the Act, 21 U.S.C. section 360bbb-3(b)(1), unless the authorization is terminated or revoked.  Performed at [facility], [place] [address] [place], [place] [address]     DG Chest 2 View  Result Date: 11/02/2022 CLINICAL DATA:  Dialysis patient with shortness of breath. Chest pain. Cough and congestion. EXAM: CHEST - 2 VIEW COMPARISON:  Portable chest and chest CT both 10/26/2023 FINDINGS: Moderately enlarged heart silhouette, on CT was noted in part due to a sizable pericardial effusion which is probably still present. Central vascular prominence is again seen without overt edema. A moderately large right and small left pleural effusions are again shown. Overlying atelectasis or consolidation is again noted in  the right-greater-than-left lower lung fields. Some of the fluid tracks into the horizontal fissure on the right. The remaining lungs are clear. Overall aeration seems unchanged. The pleural effusions were similar in appearance previously. IMPRESSION: 1. Moderately large heart silhouette, on CT was noted in part due to a sizable pericardial effusion which is probably still present. 2. Central vascular prominence without overt edema. 3. Moderately large right and small left pleural effusions with overlying atelectasis or consolidation. Similar appearance on 10/25/2022. Electronically Signed   By: Telford Nab M.D.   On: 11/02/2022 00:11    Outpatient dialysis  [facility]: [facility] prescription: MWF, F180, BFR 400, edw 92kg, 2K, 2Ca, LUE AVF, Mircera 265mcg q 2 weeks   Assessment/Recommendations: Natasha Long is a 26 y.o. female with a past medical history notable for ESRD on HD admitted with volume overload    # ESRD: Got HD on last 12/1 per outpt schedule.  HD here today - due to pt volume in hospital will only be able to run 3 hrs but will maximize UF with treatment.    # Volume/ hypertension: EDW 92kg but got below and says losing wt in jail - will need new EDW at d/c Poor compliance and volume overloaded noted recently. Cont torsemide 100 daily.  Renal diet, fluid restriciton.    # Anemia of Chronic Kidney Disease: Hemoglobin 8.5. Plan for aranesp w/ HD tdoay. Recent 11/26 iron sat 6%, will give IV iron load here.    # Secondary Hyperparathyroidism/Hyperphosphatemia: Phos 8.4. Continue home phoslo and calcitriol   # Vascular access: LUE AVF with no issues   # Chest pain: likely related to demand - cardiology following. Volume removal with HD as tolerated.  Plan for outpt nuc stress test.   #severe TR: plans to repeat TTE when euvolemic - cardiology following  # HTN: complicated by LVH.  Work towards euvolemia.  On cardizem per cards.  Resume hydralazine 25 TID and imdur 30 daily.    # Additional recommendations: - Dose all meds for creatinine clearance < 10 ml/min  - Unless absolutely necessary, no MRIs with gadolinium.  - Implement save arm precautions.  Prefer needle sticks in the dorsum of the hands or wrists.  No blood pressure measurements in arm. - If blood transfusion is requested during hemodialysis sessions, please alert [place] prior to the session.  - Use synthetic opioids (Fentanyl/Dilaudid) if needed   Call with concerns.   Justin Mend 11/02/2022, 2:58 PM

## 2022-11-03 DIAGNOSIS — R7989 Other specified abnormal findings of blood chemistry: Secondary | ICD-10-CM | POA: Diagnosis not present

## 2022-11-03 DIAGNOSIS — I5043 Acute on chronic combined systolic (congestive) and diastolic (congestive) heart failure: Secondary | ICD-10-CM | POA: Diagnosis present

## 2022-11-03 DIAGNOSIS — J45909 Unspecified asthma, uncomplicated: Secondary | ICD-10-CM | POA: Diagnosis present

## 2022-11-03 DIAGNOSIS — Z992 Dependence on renal dialysis: Secondary | ICD-10-CM | POA: Diagnosis not present

## 2022-11-03 DIAGNOSIS — D631 Anemia in chronic kidney disease: Secondary | ICD-10-CM | POA: Diagnosis present

## 2022-11-03 DIAGNOSIS — I429 Cardiomyopathy, unspecified: Secondary | ICD-10-CM | POA: Diagnosis present

## 2022-11-03 DIAGNOSIS — I340 Nonrheumatic mitral (valve) insufficiency: Secondary | ICD-10-CM | POA: Diagnosis not present

## 2022-11-03 DIAGNOSIS — I3139 Other pericardial effusion (noninflammatory): Secondary | ICD-10-CM | POA: Diagnosis present

## 2022-11-03 DIAGNOSIS — I5041 Acute combined systolic (congestive) and diastolic (congestive) heart failure: Secondary | ICD-10-CM | POA: Diagnosis not present

## 2022-11-03 DIAGNOSIS — I059 Rheumatic mitral valve disease, unspecified: Secondary | ICD-10-CM | POA: Diagnosis not present

## 2022-11-03 DIAGNOSIS — Z1152 Encounter for screening for COVID-19: Secondary | ICD-10-CM | POA: Diagnosis not present

## 2022-11-03 DIAGNOSIS — E440 Moderate protein-calorie malnutrition: Secondary | ICD-10-CM | POA: Diagnosis present

## 2022-11-03 DIAGNOSIS — I10 Essential (primary) hypertension: Secondary | ICD-10-CM

## 2022-11-03 DIAGNOSIS — N186 End stage renal disease: Secondary | ICD-10-CM | POA: Diagnosis present

## 2022-11-03 DIAGNOSIS — I083 Combined rheumatic disorders of mitral, aortic and tricuspid valves: Secondary | ICD-10-CM | POA: Diagnosis present

## 2022-11-03 DIAGNOSIS — F603 Borderline personality disorder: Secondary | ICD-10-CM | POA: Diagnosis present

## 2022-11-03 DIAGNOSIS — F332 Major depressive disorder, recurrent severe without psychotic features: Secondary | ICD-10-CM | POA: Diagnosis present

## 2022-11-03 DIAGNOSIS — I5031 Acute diastolic (congestive) heart failure: Secondary | ICD-10-CM | POA: Diagnosis not present

## 2022-11-03 DIAGNOSIS — E8770 Fluid overload, unspecified: Secondary | ICD-10-CM | POA: Diagnosis not present

## 2022-11-03 DIAGNOSIS — I272 Pulmonary hypertension, unspecified: Secondary | ICD-10-CM | POA: Diagnosis present

## 2022-11-03 DIAGNOSIS — J9601 Acute respiratory failure with hypoxia: Secondary | ICD-10-CM | POA: Diagnosis present

## 2022-11-03 DIAGNOSIS — I132 Hypertensive heart and chronic kidney disease with heart failure and with stage 5 chronic kidney disease, or end stage renal disease: Secondary | ICD-10-CM | POA: Diagnosis present

## 2022-11-03 DIAGNOSIS — N2581 Secondary hyperparathyroidism of renal origin: Secondary | ICD-10-CM | POA: Diagnosis present

## 2022-11-03 DIAGNOSIS — Z20822 Contact with and (suspected) exposure to covid-19: Secondary | ICD-10-CM | POA: Diagnosis present

## 2022-11-03 DIAGNOSIS — I25110 Atherosclerotic heart disease of native coronary artery with unstable angina pectoris: Secondary | ICD-10-CM | POA: Diagnosis present

## 2022-11-03 DIAGNOSIS — I160 Hypertensive urgency: Secondary | ICD-10-CM | POA: Diagnosis present

## 2022-11-03 DIAGNOSIS — E8889 Other specified metabolic disorders: Secondary | ICD-10-CM | POA: Diagnosis present

## 2022-11-03 DIAGNOSIS — I32 Pericarditis in diseases classified elsewhere: Secondary | ICD-10-CM | POA: Diagnosis present

## 2022-11-03 DIAGNOSIS — Z6832 Body mass index (BMI) 32.0-32.9, adult: Secondary | ICD-10-CM | POA: Diagnosis not present

## 2022-11-03 DIAGNOSIS — E8779 Other fluid overload: Secondary | ICD-10-CM | POA: Diagnosis not present

## 2022-11-03 LAB — HEPATITIS B SURFACE ANTIBODY, QUANTITATIVE: Hep B S AB Quant (Post): >1000 m[IU]/mL (ref >9.9)

## 2022-11-03 MED ORDER — GUAIFENESIN 100 MG/5ML PO LIQD
10.0000 mL | Freq: Four times a day (QID) | ORAL | Status: DC | PRN
  Administered 2022-11-03 – 2022-11-11 (×16): 10 mL via ORAL
  Filled 2022-11-03 (×19): qty 15

## 2022-11-03 NOTE — Progress Notes (Signed)
Pt known to navigator. Pt receives out-pt HD at [facility] on MWF. Pt is for possible d/c tomorrow per MD note today. Contacted Deon [phone]) with [facility] to make her aware pt may d/c tomorrow and will need transportation to clinic on Friday for HD treatment. Deon to confirm transportation to HD for Friday. Contacted [facility] to provide update to clinic as well.   Melven Sartorius Renal Navigator [phone]

## 2022-11-03 NOTE — Progress Notes (Signed)
   11/03/22 0155  Assess: MEWS Score  Temp 98.1 F (36.7 C)  BP 115/71  MAP (mmHg) 84  Pulse Rate (!) 102  Resp (!) 29  SpO2 100 %  O2 Device Nasal Cannula  O2 Flow Rate (L/min) 4 L/min  Assess: MEWS Score  MEWS Temp 0  MEWS Systolic 0  MEWS Pulse 1  MEWS RR 2  MEWS LOC 0  MEWS Score 3  MEWS Score Color Yellow  Treat  MEWS Interventions Escalated (See documentation below)  Take Vital Signs  Increase Vital Sign Frequency  Yellow: Q 2hr X 2 then Q 4hr X 2, if remains yellow, continue Q 4hrs  Escalate  MEWS: Escalate Yellow: discuss with charge nurse/RN and consider discussing with provider and RRT  Notify: Charge Nurse/RN  Name of Charge Nurse/RN Notified Eliezer Lofts, RN  Date Charge Nurse/RN Notified 11/03/22  Time Charge Nurse/RN Notified 0215  Assess: SIRS CRITERIA  SIRS Temperature  0  SIRS Pulse 1  SIRS Respirations  1  SIRS WBC 1  SIRS Score Sum  3

## 2022-11-03 NOTE — Progress Notes (Signed)
PROGRESS NOTE    Natasha Long  BOF:[id number] DOB: 1996-03-24 DOA: 11/01/2022 PCP: [name], MD    Brief Narrative:  Natasha Long is a 26 y.o. 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  Patient again presents at this time with substernal chest pain for 2 days.  Not a good historian.  In the ED, patient had stable vitals including blood pressure.  Labs showed hemoglobin of 8.5 with elevated troponin at  365 and then 366 ng/L.  BNP was 1485.8 pg/mL.  BMP showed a BUN of 49 and creatinine of 8.93 mg/dL.  Chest x-ray showed moderate large heart silhouette with central vascular prominence without overt edema.  Moderate to large right and small left pleural effusions with overlying atelectasis or consolidations similar in appearance to 10/25/2022.  Patient was then admitted hospital for further evaluation and treatment.  Assessment and Plan:    Volume overload   History of ESRD on hemodialysis   Acute on chronic combined systolic and diastolic heart failure (HCC)   Mitral valve insufficiency (severe) Aortic stenosis.  Mild to moderate Continue telemetry monitoring, oxygen supplementation, continue torsemide 100 mg daily.  Continue intake and  output charting, daily weights.  No ACE inhibitor's or beta-blocker.  Cardiology has been consulted including nephrology.  Patient has undergone hemodialysis at this time.  2D echocardiogram June 2023 with LV ejection fraction of 45 to 50%.  Coumadin on hold to CHF.  Plans to repeat 2D echocardiogram when volume status is euvolemic.  Counseled her regarding fluid restriction and compliance with dialysis.    Chest pain, elevated troponin EKG nonischemic.  Troponin levels elevated but not uptrending.  Cardiology recommends outpatient follow-up with stress test and aggressive hemodialysis.     Chronic hypertension Continue Cardizem and torsemide.  Cardiology recommends Imdur 30 and hydralazine 25 mg 3 times daily as tolerated.  Currently blood pressure at 137/87.    Pericardial effusion without cardiac tamponade None worsening but chest pain could be uremic pericarditis.       ESRD on hemodialysis [facility]) Received hemodialysis.  Nephrology on board.  Counseled her regarding need for full attendance with dialysis.     Anemia in chronic kidney disease Erythropoietin as per nephrology.    Severe recurrent major depression without psychotic features ([place])   Borderline personality disorder ([place]) Continue Abilify, hydroxyzine, trazodone    Moderate protein-calorie malnutrition ([place]) Nutrition has been consulted.     DVT prophylaxis: SCDs Start: 11/02/22 0754   Code Status:     Code Status: Full Code  Disposition: To [facility] jail likely 11/04/22  Status is: Observation  The patient will require care spanning > 2 midnights and should be moved to inpatient because: Volume overload, chest pain, on supplemental oxygen   Family Communication: None at bedside  Consultants:  Cardiology Nephrology  Procedures:  Hemodialysis  Antimicrobials:  None  Anti-infectives (From admission, onward)    None       Subjective: Today, patient was seen and examined at bedside.  Patient  complains of shortness of breath and some chest discomfort.  Denies any dizziness lightheadedness but feels sleepy.  Objective: Vitals:   11/03/22 0155 11/03/22 0337 11/03/22 0600 11/03/22 1014  BP: 115/71 123/73 (!) 104/58 137/87  Pulse: (!) 102 (!) 104 87 86  Resp: (!) 29 (!) 30 (!) 23 16  Temp: 98.1 F (36.7 C) 98.7 F (37.1 C)    TempSrc: Axillary Oral Oral   SpO2: 100% 100% 98% 99%  Weight:      Height:        Intake/Output Summary (Last 24 hours) at 11/03/2022 1429 Last data filed at 11/03/2022 0900 Gross per 24 hour  Intake 457 ml  Output 4000 ml  Net -3543 ml   Filed Weights   11/01/22 2327 11/02/22 2231 11/03/22 0125  Weight: 91.6 kg 91.6 kg 87.6 kg    Physical Examination: Body mass index is 29.36 kg/m.  General:  Average built, not in obvious distress, on nasal cannula oxygen at  4 L/min HENT:   No scleral pallor or icterus noted. Oral mucosa is moist.  Chest:    Diminished breath sounds bilaterally.  Coarse breath sounds noted. CVS: S1 &S2 heard. No murmur.  Regular rate and rhythm. Abdomen: Soft, nontender, nondistended.  Bowel sounds are heard.   Extremities: No cyanosis, clubbing or edema.  Peripheral pulses are palpable. Psych: Alert, awake and mildly Communicative, somnolent, propped up in bed, CNS:  No cranial nerve deficits.  Power equal in all extremities.   Skin: Warm and dry.  No rashes noted.  Data Reviewed:   CBC: Recent Labs  Lab 10/28/22 0432 11/02/22 0239  WBC 9.3 9.7  HGB 8.4* 8.5*  HCT 27.8* 28.1*  MCV 98.9 100.4*  PLT 223 401    Basic Metabolic Panel: Recent Labs  Lab 10/28/22 0432 11/02/22 0239  NA 133* 139  K 4.6 4.7  CL 95* 100  CO2 22 27  GLUCOSE 97 89  BUN 77* 49*  CREATININE 10.64* 8.93*  CALCIUM 9.1 9.0  PHOS 8.4*  --     Liver Function Tests: Recent Labs  Lab 10/28/22 0432  ALBUMIN 2.2*     Radiology Studies: DG Chest 2 View  Result Date: 11/02/2022 CLINICAL DATA:  Dialysis patient with shortness of  breath. Chest pain. Cough and congestion. EXAM: CHEST - 2 VIEW COMPARISON:  Portable chest and chest CT both 10/26/2023 FINDINGS: Moderately enlarged heart silhouette, on CT was noted in part due to a sizable pericardial effusion which is probably still present. Central vascular prominence is again seen without overt edema. A moderately large right and small left pleural effusions are again shown. Overlying atelectasis or consolidation is again noted in the right-greater-than-left lower lung fields. Some of the fluid tracks into the horizontal fissure on the right. The remaining lungs are clear. Overall aeration seems unchanged. The pleural effusions were similar in appearance previously. IMPRESSION: 1. Moderately large heart silhouette, on CT was noted in part due to a sizable pericardial effusion which is probably still present. 2. Central vascular prominence without overt edema. 3. Moderately large right and small left pleural effusions with overlying atelectasis or consolidation. Similar appearance on 10/25/2022. Electronically Signed   By: Telford Nab M.D.   On: 11/02/2022 00:11      LOS: 0 days    [name], MD [facility] Available via Epic secure chat 7am-7pm After these hours, please refer to coverage provider listed on [email or web address] 11/03/2022, 2:29 PM

## 2022-11-03 NOTE — Progress Notes (Signed)
Patient has been sleeping most of the day. Attempted to wake her up and give her medications, but patient stated to leave her alone and she was not taking her medications. Patient also refused lab work today as well.

## 2022-11-03 NOTE — Progress Notes (Signed)
[facility] Progress Note   Subjective:  Seen in room - eyes closed, curled on side but answering questions. C/o chronic pain, no dyspnea. HD last night with 4L removed.   Objective Vitals:   11/03/22 0125 11/03/22 0155 11/03/22 0337 11/03/22 0600  BP: (!) 142/78 115/71 123/73 (!) 104/58  Pulse: (!) 102 (!) 102 (!) 104 87  Resp: 20 (!) 29 (!) 30 (!) 23  Temp: 98.2 F (36.8 C) 98.1 F (36.7 C) 98.7 F (37.1 C)   TempSrc: Oral Axillary Oral Oral  SpO2: 98% 100% 100% 98%  Weight: 87.6 kg     Height:       Physical Exam General: Chronically ill appearing woman, NAD. Awake, but eyes closed and curled up Heart: RRR; no murmur Lungs: CTAB; no rales Abdomen: soft Extremities: no LE edema Dialysis Access: LUE AVF + thrill/bruit  Additional Objective Labs: Basic Metabolic Panel: Recent Labs  Lab 10/28/22 0432 11/02/22 0239  NA 133* 139  K 4.6 4.7  CL 95* 100  CO2 22 27  GLUCOSE 97 89  BUN 77* 49*  CREATININE 10.64* 8.93*  CALCIUM 9.1 9.0  PHOS 8.4*  --    Liver Function Tests: Recent Labs  Lab 10/28/22 0432  ALBUMIN 2.2*   CBC: Recent Labs  Lab 10/28/22 0432 11/02/22 0239  WBC 9.3 9.7  HGB 8.4* 8.5*  HCT 27.8* 28.1*  MCV 98.9 100.4*  PLT 223 300   Studies/Results: DG Chest 2 View  Result Date: 11/02/2022 CLINICAL DATA:  Dialysis patient with shortness of breath. Chest pain. Cough and congestion. EXAM: CHEST - 2 VIEW COMPARISON:  Portable chest and chest CT both 10/26/2023 FINDINGS: Moderately enlarged heart silhouette, on CT was noted in part due to a sizable pericardial effusion which is probably still present. Central vascular prominence is again seen without overt edema. A moderately large right and small left pleural effusions are again shown. Overlying atelectasis or consolidation is again noted in the right-greater-than-left lower lung fields. Some of the fluid tracks into the horizontal fissure on the right. The remaining lungs are clear.  Overall aeration seems unchanged. The pleural effusions were similar in appearance previously. IMPRESSION: 1. Moderately large heart silhouette, on CT was noted in part due to a sizable pericardial effusion which is probably still present. 2. Central vascular prominence without overt edema. 3. Moderately large right and small left pleural effusions with overlying atelectasis or consolidation. Similar appearance on 10/25/2022. Electronically Signed   By: Telford Nab M.D.   On: 11/02/2022 00:11    Medications:  [START ON 11/04/2022] ferric gluconate (FERRLECIT) IVPB      ARIPiprazole  7.5 mg Oral Daily   calcitRIOL  0.25 mcg Oral Q M,W,F-HD   calcium acetate  667 mg Oral TID WC   Chlorhexidine Gluconate Cloth  6 each Topical Q0600   darbepoetin (ARANESP) injection - DIALYSIS  100 mcg Subcutaneous Q Mon-1800   diltiazem  360 mg Oral Daily   hydrALAZINE  25 mg Oral Q8H   isosorbide mononitrate  30 mg Oral Daily   sevelamer carbonate  1,600 mg Oral TID WC   torsemide  100 mg Oral q morning   traZODone  100 mg Oral QHS    Dialysis Orders: MWF at [facility] 4hr, BFR 400, EDW 92kg, 2K/2Ca, LUE AVF - Mircera 273mcg Iv q 2 weeks  Assessment/Plan: ESRD: Usual MWF schedule - HD yesterday per schedule, 4L removed. Next HD tomorrow if remains inpatient. Volume/ hypertension: with severe LVH. Losing weight  in jail, will lower EDW to 87kg and continue to challenge as tolerated. On torsemide 100mg  daily, cardizem 360mg  daily as well as hydralazine and isosorbide. BP low sided today - may need to acutally scale back meds - will see how BP runs throughout day. Renal diet, fluid restriciton.  Anemia of Chronic Kidney Disease: Hemoglobin 8.5. Given Aranesp 186mcg on 12/4. Recent 11/26 iron sat 6%, will give IV iron load here.  Secondary Hyperparathyroidism/Hyperphosphatemia: Phos 8.4. Continue home phoslo and calcitriol Vascular access: LUE AVF with no issues Chest pain: Trops high but stable - likely related to  demand - cardiology following. Volume removal with HD as tolerated.  Plan for outpt nuc stress test.  Severe TR: plans to repeat TTE when euvolemic - cardiology following Dispo: [place] for discharge from renal standpoint with further cardiology evaluation as outpatient.  [name], PA-C 11/03/2022, 8:54 AM  [employer]

## 2022-11-03 NOTE — Hospital Course (Addendum)
Natasha Long was admitted to the hospital with the working diagnosis of volume overload in the setting of ESRD on HD.    26 y.o. 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    Patient again presents at this time with substernal chest pain for 2 days.  Not a good historian. In the ED  vitals temp 98.8, HR 113, RR 16, blood pressure 151/100, 02 saturation 91% on room air. Ill looking appearing, lungs with no wheezing or rales, heart with S1 and S2 present and rhythmic, abdomen with no distention, positive lower extremity edema.   Na 139, K 4,7 Cl 100 bicarbonate 27 glucose 89 bun 49 and cr 8,93  BNP 1,485 High sensitive troponin 365 and 366  Wbc 9,7 hgb 8,5 plt 300  Sars covid 19 negative  Influenza A and B negative   Chest radiograph with cardiomegaly, bilateral hilar vascular congestion and cephalization of the vasculature. Right pleural effusion with fluid in the right fissure, small left pleural effusion.   EKG 105 bpm, normal axis, normal intervals, sinus rhythm with poor R R wave progression with no significant ST segment or T wave changes, low voltage.   Patient underwent hemodialysis with ultrafiltration with improvement in volume status.  Plan  to follow up pericardial effusion as outpatient.

## 2022-11-03 NOTE — Progress Notes (Signed)
   11/03/22 0125  Vitals  Temp 98.2 F (36.8 C)  Temp Source Oral  BP (!) 142/78  BP Location Right Arm  BP Method Automatic  Patient Position (if appropriate) Lying  Pulse Rate (!) 102  Pulse Rate Source Monitor  ECG Heart Rate (!) 104  Resp 20  Post Treatment  Dialyzer Clearance Lightly streaked  Duration of HD Treatment -hour(s) 3 hour(s)  Liters Processed 71.5  Fluid Removed (mL) 4000 mL  Tolerated HD Treatment Yes  AVG/AVF Arterial Site Held (minutes) 5 minutes  AVG/AVF Venous Site Held (minutes) 5 minutes   TX fin w/o difficulty.

## 2022-11-03 NOTE — Progress Notes (Signed)
TRH night cross cover note:   I was notified by patient's RN of patient's request for Robitussin in setting of cough.  Subsequently placed order for prn Robitussin.  Refraining from the dextromethorphan component given the patient's cardiac history as well as potential interactions with some of the patient's additional medications, including trazodone.     Babs Bertin, DO Hospitalist

## 2022-11-04 ENCOUNTER — Inpatient Hospital Stay (HOSPITAL_COMMUNITY): Payer: Medicaid Other

## 2022-11-04 DIAGNOSIS — I5041 Acute combined systolic (congestive) and diastolic (congestive) heart failure: Secondary | ICD-10-CM

## 2022-11-04 DIAGNOSIS — I340 Nonrheumatic mitral (valve) insufficiency: Secondary | ICD-10-CM | POA: Diagnosis not present

## 2022-11-04 DIAGNOSIS — I3139 Other pericardial effusion (noninflammatory): Secondary | ICD-10-CM

## 2022-11-04 DIAGNOSIS — I10 Essential (primary) hypertension: Secondary | ICD-10-CM | POA: Diagnosis not present

## 2022-11-04 DIAGNOSIS — N186 End stage renal disease: Secondary | ICD-10-CM | POA: Diagnosis not present

## 2022-11-04 DIAGNOSIS — E8770 Fluid overload, unspecified: Secondary | ICD-10-CM | POA: Diagnosis not present

## 2022-11-04 LAB — ECHOCARDIOGRAM COMPLETE
AR max vel: 1.29 cm2
AV Area VTI: 1.37 cm2
AV Area mean vel: 1.35 cm2
AV Mean grad: 24 mmHg
AV Peak grad: 46.9 mmHg
Ao pk vel: 3.42 m/s
Area-P 1/2: 2.86 cm2
Height: 68 in
MV M vel: 5.69 m/s
MV Peak grad: 129.5 mmHg
MV VTI: 1.85 cm2
Radius: 0.4 cm
S' Lateral: 3.2 cm
Weight: 3294.55 oz

## 2022-11-04 MED ORDER — HYDROXYZINE HCL 25 MG PO TABS
25.0000 mg | ORAL_TABLET | Freq: Once | ORAL | Status: AC
  Administered 2022-11-06: 25 mg via ORAL
  Filled 2022-11-04: qty 1

## 2022-11-04 NOTE — Progress Notes (Signed)
  [facility] Progress Note   Subjective:  Seen in room - awake/sitting up in bed. Reports dyspnea overnight.   Objective Vitals:   11/03/22 1014 11/03/22 2047 11/04/22 0919 11/04/22 1004  BP: 137/87 (!) 133/96 132/87   Pulse: 86 (!) 107 (!) 112   Resp: 16 18 18  (!) 23  Temp:  99.1 F (37.3 C) 98.6 F (37 C)   TempSrc:  Oral Oral   SpO2: 99% 100% 100%   Weight:      Height:       Physical Exam General: Chronically ill appearing woman, NAD. [place] O2 in place Heart: RRR; no murmur Lungs: CTAB; no rales Abdomen: soft Extremities: Tense 1+ LE edema; dependent L forearm edema Dialysis Access: LUE AVF + thrill/bruit  Additional Objective Labs: Basic Metabolic Panel: Recent Labs  Lab 11/02/22 0239  NA 139  K 4.7  CL 100  CO2 27  GLUCOSE 89  BUN 49*  CREATININE 8.93*  CALCIUM 9.0   CBC: Recent Labs  Lab 11/02/22 0239  WBC 9.7  HGB 8.5*  HCT 28.1*  MCV 100.4*  PLT 300   Medications:  ferric gluconate (FERRLECIT) IVPB      ARIPiprazole  7.5 mg Oral Daily   calcitRIOL  0.25 mcg Oral Q M,W,F-HD   calcium acetate  667 mg Oral TID WC   Chlorhexidine Gluconate Cloth  6 each Topical Q0600   darbepoetin (ARANESP) injection - DIALYSIS  100 mcg Subcutaneous Q Mon-1800   diltiazem  360 mg Oral Daily   hydrALAZINE  25 mg Oral Q8H   isosorbide mononitrate  30 mg Oral Daily   sevelamer carbonate  1,600 mg Oral TID WC   torsemide  100 mg Oral q morning   traZODone  100 mg Oral QHS    Dialysis Orders: MWF at [facility] 4hr, BFR 400, EDW 92kg, 2K/2Ca, LUE AVF - Mircera 256mcg Iv q 2 weeks   Assessment/Plan: ESRD: Usual MWF schedule - HD today - 4L UFG. Volume/ hypertension: with severe LVH. Losing weight in jail, will lower EDW and continue to challenge as tolerated. On torsemide 100mg  daily, cardizem 360mg  daily as well as hydralazine and isosorbide. BP controlled - may need to acutally scale back meds. Renal diet, fluid restriciton.  Anemia of Chronic Kidney  Disease: Hemoglobin 8.5. Given Aranesp 159mcg on 12/4. Recent 11/26 iron sat 6%, will give IV iron load here.  Secondary Hyperparathyroidism/Hyperphosphatemia: Phos 8.4. Continue home phoslo and calcitriol Vascular access: LUE AVF with no issues Chest pain: Trops high but stable - likely related to demand - cardiology following. Volume removal with HD as tolerated.  Plan for outpt nuc stress test.  Severe TR: plans to repeat TTE when euvolemic - cardiology following   [name], PA-C 11/04/2022, 11:08 AM  [facility]

## 2022-11-04 NOTE — Progress Notes (Addendum)
Progress Note  Patient Name: Natasha Long Date of Encounter: 11/04/2022  [facility] Cardiologist: Sinclair Grooms, MD   Subjective   Currently in HD.  Complaining of severe SOB sitting upright as well as sharp CP.    Inpatient Medications    Scheduled Meds:  ARIPiprazole  7.5 mg Oral Daily   calcitRIOL  0.25 mcg Oral Q M,W,F-HD   calcium acetate  667 mg Oral TID WC   Chlorhexidine Gluconate Cloth  6 each Topical Q0600   darbepoetin (ARANESP) injection - DIALYSIS  100 mcg Subcutaneous Q Mon-1800   diltiazem  360 mg Oral Daily   hydrALAZINE  25 mg Oral Q8H   isosorbide mononitrate  30 mg Oral Daily   sevelamer carbonate  1,600 mg Oral TID WC   torsemide  100 mg Oral q morning   traZODone  100 mg Oral QHS   Continuous Infusions:  ferric gluconate (FERRLECIT) IVPB     PRN Meds: acetaminophen **OR** acetaminophen, dicyclomine, guaiFENesin, hydrOXYzine, methocarbamol, ondansetron **OR** ondansetron (ZOFRAN) IV   Vital Signs    Vitals:   11/03/22 2047 11/04/22 0919 11/04/22 1004 11/04/22 1100  BP: (!) 133/96 132/87    Pulse: (!) 107 (!) 112    Resp: 18 18 (!) 23 (!) 28  Temp: 99.1 F (37.3 C) 98.6 F (37 C)    TempSrc: Oral Oral    SpO2: 100% 100%  98%  Weight:      Height:        Intake/Output Summary (Last 24 hours) at 11/04/2022 1528 Last data filed at 11/04/2022 1250 Gross per 24 hour  Intake 500 ml  Output 0 ml  Net 500 ml      11/03/2022    1:25 AM 11/02/2022   10:31 PM 11/01/2022   11:27 PM  Last 3 Weights  Weight (lbs) 193 lb 2 oz 201 lb 15.1 oz 202 lb  Weight (kg) 87.6 kg 91.6 kg 91.627 kg      Telemetry    NSR - Personally Reviewed  ECG    No new EKG to review - Personally Reviewed  Physical Exam  In moderate distress due to SOB Neck: No JVD Cardiac: regular and tachy no murmurs, rubs, or gallops.  Respiratory: Clear to auscultation bilaterally. GI: Soft, nontender, non-distended  MS: 1+ BLE edema; No deformity. Neuro:   Nonfocal  Psych: Normal affect   Labs    High Sensitivity Troponin:   Recent Labs  Lab 10/24/22 1713 10/25/22 0553 10/25/22 0700 11/02/22 0239 11/02/22 0424  TROPONINIHS 574* 646* 626* 365* 366*      Chemistry Recent Labs  Lab 11/02/22 0239  NA 139  K 4.7  CL 100  CO2 27  GLUCOSE 89  BUN 49*  CREATININE 8.93*  CALCIUM 9.0  GFRNONAA 6*  ANIONGAP 12     Hematology Recent Labs  Lab 11/02/22 0239  WBC 9.7  RBC 2.80*  HGB 8.5*  HCT 28.1*  MCV 100.4*  MCH 30.4  MCHC 30.2  RDW 19.0*  PLT 300    BNP Recent Labs  Lab 11/02/22 0239  BNP 1,485.8*     DDimer No results for input(s): "DDIMER" in the last 168 hours.  Radiology    No results found.  Cardiac Studies   Echo 11/01/2022 IMPRESSIONS    1. Left ventricular ejection fraction, by estimation, is 40 to 45%. Left  ventricular ejection fraction by 3D volume is 43 %. The left ventricle has  mildly decreased function. The  left ventricle demonstrates global  hypokinesis. The left ventricular  internal cavity size was mildly dilated. There is severe concentric left  ventricular hypertrophy. Left ventricular diastolic parameters are  consistent with Grade II diastolic dysfunction (pseudonormalization).  Elevated left atrial pressure.   2. Right ventricular systolic function is normal. The right ventricular  size is moderately enlarged. There is severely elevated pulmonary artery  systolic pressure. The estimated right ventricular systolic pressure is  53.9 mmHg.   3. Left atrial size was mildly dilated.   4. Right atrial size was mildly dilated.   5. Moderate pericardial effusion. The pericardial effusion is  circumferential. There is no evidence of cardiac tamponade.   6. The mitral valve is degenerative. Severe mitral valve regurgitation.  The mean mitral valve gradient is 7.1 mmHg with average heart rate of 110  bpm. Moderate to severe mitral annular calcification.   7. The tricuspid valve is  abnormal. Tricuspid valve regurgitation is  severe.   8. The aortic valve is tricuspid. There is mild calcification of the  aortic valve. There is mild thickening of the aortic valve. Aortic valve  regurgitation is mild to moderate. Mild to moderate aortic valve stenosis.   9. The inferior vena cava is dilated in size with <50% respiratory  variability, suggesting right atrial pressure of 15 mmHg.   Comparison(s): No significant change from prior study. Prior images  reviewed side by side.   2D echo 10/25/2022 IMPRESSIONS    1. Left ventricular ejection fraction, by estimation, is 40 to 45%. Left  ventricular ejection fraction by 3D volume is 43 %. The left ventricle has  mildly decreased function. The left ventricle demonstrates global  hypokinesis. The left ventricular  internal cavity size was mildly dilated. There is severe concentric left  ventricular hypertrophy. Left ventricular diastolic parameters are  consistent with Grade II diastolic dysfunction (pseudonormalization).  Elevated left atrial pressure.   2. Right ventricular systolic function is normal. The right ventricular  size is moderately enlarged. There is severely elevated pulmonary artery  systolic pressure. The estimated right ventricular systolic pressure is  76.7 mmHg.   3. Left atrial size was mildly dilated.   4. Right atrial size was mildly dilated.   5. Moderate pericardial effusion. The pericardial effusion is  circumferential. There is no evidence of cardiac tamponade.   6. The mitral valve is degenerative. Severe mitral valve regurgitation.  The mean mitral valve gradient is 7.1 mmHg with average heart rate of 110  bpm. Moderate to severe mitral annular calcification.   7. The tricuspid valve is abnormal. Tricuspid valve regurgitation is  severe.   8. The aortic valve is tricuspid. There is mild calcification of the  aortic valve. There is mild thickening of the aortic valve. Aortic valve   regurgitation is mild to moderate. Mild to moderate aortic valve stenosis.   9. The inferior vena cava is dilated in size with <50% respiratory  variability, suggesting right atrial pressure of 15 mmHg.   Comparison(s): No significant change from prior study. Prior images  reviewed side by side.   Patient Profile     26 y.o. female with a hx of end-stage renal disease on dialysis, hypertension, borderline personality disorder, PTSD and CHF followed by Dr. Tamala Julian with cardiology who is being seen today for the evaluation of chest pain at the request of Tennis Must, MD.   [place]    Chest pain/pericardial effusion -Chest pain is atypical and described as tightness and sharp much worse  with inspiration and consistent with pericardial effusion noted on 2D echo and consistent with uremic pericarditis with pericardial effusion.  There is no evidence of tamponade -EKG is nonischemic and troponin is minimally elevated with flat trend and not consistent with ACS -currently getting HD with UF by renal and complaining of severe SOB and CP>>will get stat echo to rule out worsening pericardial effusion and tamponade -She was supposed to to have an outpatient nuclear stress test but this was never done.  Would recommend reevaluation outpatient once heart failure exacerbation has been treated>> do not recommend coronary CTA in the setting of end-stage renal disease on dialysis as patient likely has significant coronary artery calcifications   2. 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 This was medically managed. Repeat echo 9/26 with stable LVEF 45-50%. Severe MR noted.  -She has severe LVH on 2D echo so suspect her cardiomyopathy is related to uncontrolled hypertension medical noncompliance -Now readmitted with acute on chronic HFmrEF secondary to missed dialysis sessions and medical noncompliance with  heart failure medicines -She was placed on carvedilol, Imdur and hydralazine at last hospitalization in September 2023 -She admits to missing doses of medications and blood pressure was markedly elevated on admission as high as 161/119 mmHg -Volume management per nephrology.   -continue Torsemide 100mg  daily, Hydralazine 25mg  TID -No ACE/ARB/ARNI/MRA due to ESRD on HD -Carvedilol on hold due to acute CHF decompensation -would prefer to ultimately get off cardizem due to LV dysfunction>>goal would be to maximize hydralazine and Imdur and restart Carvedilol and max out as well and get off Cardizem.  IF BP remains elevated then would consider addition of amlodipine   3.  Hypertensive urgency -Patient has evidence of endorgan damage with severe LVH on echo -Suspect patient again has been noncompliant in taking her medication and also has missed dialysis sessions -BP should improve with dialysis sessions -She was hydralazine on last hospitalization but this is fallen off her med list and unclear why -see above recommendations to titrate Hydralazine/Imdur, restart Carvedilol and wean off Cardizem due to LV dysfunction of ok with renal   4.  Mitral regurgitation -This has been severe on several recent echoes. -She has severe mitral annular calcification related to her end-stage renal disease with moderate mitral stenosis with mean mitral valve gradient of 7 mmHg although this is in the setting of heart rate of 110 bpm -Once she has completed dialysis and is back to her dry weight would repeat echo to see if MR improves   5.  Aortic stenosis -2D echo this admission showed mild to moderate aortic stenosis and mild to moderate AI -Follow with yearly echo     For questions or updates, please contact [place] Please consult [email or web address] for contact info under        Signed, Fransico Him, MD  11/04/2022, 3:28 PM

## 2022-11-04 NOTE — Progress Notes (Signed)
Repeat echo done showing increase in size of pericardial effusion from 09/2022 although unclear if it was larger than this on day of admission after several missed sessions of HD.  She had increased SOB with CP but also has issues with anxiety today.  Hemodynamically she is stable.  Echo showed respiratory variability >25% in MV inflow velocities and dilated IVC but no RV diastolic collapse or RA inversion. She is not tachycardic and BP is very stable so no evidence of tamponade at this time.  Continue with HD.  May need to consider pericardial window if she does not improve with HD.  Will repeat limited echo tomorrow.  If she becomes tachycardic or BP starts to drop may need to consider urgent pericardial window.

## 2022-11-04 NOTE — Progress Notes (Signed)
Heart Failure Navigator Progress Note  Assessed for Heart & Vascular TOC clinic readiness.  Patient does not meet criteria due to ESRD on HD. Frequent admissions and ED visits for volume overload and pulmonary edema and chest pressure.   HF Navigation will sign-off.    Pricilla Holm, MSN, RN Heart Failure Nurse Navigator

## 2022-11-04 NOTE — Progress Notes (Incomplete)
*  PRELIMINARY RESULTS* Echocardiogram 2D Echocardiogram has been performed.  Natasha Long 11/04/2022, 7:13 PM

## 2022-11-04 NOTE — Progress Notes (Signed)
   11/04/22 0919  Assess: MEWS Score  Temp 98.6 F (37 C)  BP 132/87  MAP (mmHg) 97  Pulse Rate (!) 112  Resp 18  SpO2 100 %  O2 Device Nasal Cannula  O2 Flow Rate (L/min) 4.5 L/min  Assess: MEWS Score  MEWS Temp 0  MEWS Systolic 0  MEWS Pulse 2  MEWS RR 0  MEWS LOC 0  MEWS Score 2  MEWS Score Color Yellow  Assess: if the MEWS score is Yellow or Red  Were vital signs taken at a resting state? Yes  Focused Assessment No change from prior assessment  Does the patient meet 2 or more of the SIRS criteria? Yes  Does the patient have a confirmed or suspected source of infection? No  MEWS guidelines implemented *See Row Information* Yes  Take Vital Signs  Increase Vital Sign Frequency  Yellow: Q 2hr X 2 then Q 4hr X 2, if remains yellow, continue Q 4hrs  Escalate  MEWS: Escalate Yellow: discuss with charge nurse/RN and consider discussing with provider and RRT  Notify: Charge Nurse/RN  Name of Charge Nurse/RN Notified [place], RN  Date Charge Nurse/RN Notified 11/04/22  Time Charge Nurse/RN Notified 1005  Assess: SIRS CRITERIA  SIRS Temperature  0  SIRS Pulse 1  SIRS Respirations  0  SIRS WBC 1  SIRS Score Sum  2

## 2022-11-04 NOTE — Progress Notes (Signed)
   11/04/22 2015  Vitals  Temp 98.7 F (37.1 C)  Temp Source Oral  BP 130/76  MAP (mmHg) 91  BP Location Right Arm  BP Method Automatic  Patient Position (if appropriate) Lying  Pulse Rate 95  Pulse Rate Source Monitor  ECG Heart Rate 94  Resp (!) 23  Post Treatment  Dialyzer Clearance Heavily streaked  Duration of HD Treatment -hour(s) 3.5 hour(s)  Liters Processed 70.7  Fluid Removed (mL) 4000 mL  Tolerated HD Treatment Yes  AVG/AVF Arterial Site Held (minutes) 5 minutes  AVG/AVF Venous Site Held (minutes) 5 minutes

## 2022-11-04 NOTE — Progress Notes (Signed)
PROGRESS NOTE    Natasha Long  ZOX:[id number] DOB: 17-Mar-1996 DOA: 11/01/2022 PCP: [name], MD   Brief Narrative:  This 26 yrs old female with PMH significant of asthma, hypertension, chronic combined systolic heart failure,, chronic non-specific chest pain, severe mitral regurgitation, pericardial effusion without cardiac tamponade, pleural effusion, focal glomerulosclerosis, ESRD on hemodialysis, secondary hyperparathyroidism, iron deficiency / ESRD anemia, class I obesity, borderline personality disorder, recurrent depression without psychotic features, polysubstance abuse (cannabis, cocaine, nicotine,  occasional alcohol use), PTSD, homelessness, insomnia, peripheral neuropathy, syphilis, septic arthritis of multiple joints who was recently admitted for acute respiratory failure in the setting of volume overload causing pulmonary edema, rhinovirus infection, hypertensive urgency and discharged to the local jail.  Patient again presents at this time with substernal chest pain for 2 days.  Not a good historian.  In the ED, patient had stable vitals including blood pressure.  Labs showed hemoglobin of 8.5 with elevated troponin at 365 and then 366 ng/L.  BNP was 1485.8 pg/mL.  BMP showed  BUN of 49 and creatinine of 8.93 mg/dL.  Chest x-ray showed moderate large heart silhouette with central vascular prominence without overt edema.  Moderate to large right and small left pleural effusions with overlying atelectasis or consolidations similar in appearance to 10/25/2022. Patient was then admitted hospital for further evaluation and treatment.   Assessment & Plan:   Principal Problem:   Volume overload Active Problems:   Chronic hypertension   Elevated troponin   Pericardial effusion without cardiac tamponade   Anemia in chronic kidney disease   Severe recurrent major depression without psychotic features (HCC)   Borderline personality disorder (HCC)   Moderate protein-calorie  malnutrition (HCC)   Acute on chronic combined systolic and diastolic heart failure (HCC)   Mitral valve insufficiency   ESRD on hemodialysis (HCC)   Chest pain   Volume overload: ESRD on hemodialysis: Acute on chronic combined systolic and diastolic heart failure Patient presented with severe shortness of breath. Continue telemetry monitoring, oxygen supplementation, continue torsemide 100 mg daily.   Continue intake and output charting, daily weights.  No ACE inhibitor's or beta-blocker.   Cardiology and Nephrology has been consulted.  Patient has undergone hemodialysis at this time.   2D echo June 2023 with LV ejection fraction of 45 to 50%.  Coumadin on hold to CHF.   Plans to repeat 2D echocardiogram when volume status is euvolemic.   Counseled her regarding fluid restriction and compliance with dialysis.   Chronic chest pain, elevated troponin: EKG nonischemic.  Troponin elevated but not uptrending. Cardiology recommends outpatient follow-up with stress test and aggressive hemodialysis.   Essential hypertension: Continue Cardizem and torsemide.   Cardiology recommends Imdur 30 and hydralazine 25 mg 3 times daily as tolerated.   Blood pressure is reasonably controlled.   Pericardial effusion without tamponade: Chest pain could be uremic pericarditis. Controlled.   ESRD on hemodialysis: Nephrology on board.   Continue dialysis as per nephro . counseled her regarding need for full attendance with dialysis.   Anemia of chronic kidney disease: Erythropoietin as per nephrology.   Major depression recurrent. Borderline personality disorder Continue Abilify, hydroxyzine, trazodone.   Moderate protein-calorie malnutrition ([place]) Nutrition has been consulted.   DVT prophylaxis: SCDs Code Status: Full code Family Communication: No family at bedside Disposition Plan:   Status is: Inpatient Remains inpatient appropriate because: To [facility] likely 11/05/2022    Consultants:  Cardiology Nephrology  Procedures:  Hemodialysis Antimicrobials:  None  Subjective: Patient was  seen and examined at bedside.  Overnight events noted.   Patient reports still having shortness of breath,  going to have dialysis today.  Objective: Vitals:   11/03/22 2047 11/04/22 0919 11/04/22 1004 11/04/22 1100  BP: (!) 133/96 132/87    Pulse: (!) 107 (!) 112    Resp: 18 18 (!) 23 (!) 28  Temp: 99.1 F (37.3 C) 98.6 F (37 C)    TempSrc: Oral Oral    SpO2: 100% 100%  98%  Weight:      Height:        Intake/Output Summary (Last 24 hours) at 11/04/2022 1506 Last data filed at 11/04/2022 1250 Gross per 24 hour  Intake 500 ml  Output 0 ml  Net 500 ml   Filed Weights   11/01/22 2327 11/02/22 2231 11/03/22 0125  Weight: 91.6 kg 91.6 kg 87.6 kg    Examination:  General exam: Appears comfortable, not in any acute distress.  Deconditioned Respiratory system: Decreased breath sounds, no accessory muscle use, respiratory effort normal, RR 12 Cardiovascular system: S1 & S2 heard, regular rate and rhythm, no murmur. Gastrointestinal system: Abdomen is soft, non distended, non tender, BS+ Central nervous system: Alert and oriented x 3. No focal neurological deficits. Extremities: Edema+,  No cyanosis, no clubbing Skin: No rashes, lesions or ulcers Psychiatry: Judgement and insight appear normal. Mood & affect appropriate.     Data Reviewed: I have personally reviewed following labs and imaging studies  CBC: Recent Labs  Lab 11/02/22 0239  WBC 9.7  HGB 8.5*  HCT 28.1*  MCV 100.4*  PLT 474   Basic Metabolic Panel: Recent Labs  Lab 11/02/22 0239  NA 139  K 4.7  CL 100  CO2 27  GLUCOSE 89  BUN 49*  CREATININE 8.93*  CALCIUM 9.0   GFR: Estimated Creatinine Clearance: 10.9 mL/min (A) (by C-G formula based on SCr of 8.93 mg/dL (H)). Liver Function Tests: No results for input(s): "AST", "ALT", "ALKPHOS", "BILITOT", "PROT", "ALBUMIN" in the last  168 hours. No results for input(s): "LIPASE", "AMYLASE" in the last 168 hours. No results for input(s): "AMMONIA" in the last 168 hours. Coagulation Profile: No results for input(s): "INR", "PROTIME" in the last 168 hours. Cardiac Enzymes: No results for input(s): "CKTOTAL", "CKMB", "CKMBINDEX", "TROPONINI" in the last 168 hours. BNP (last 3 results) No results for input(s): "PROBNP" in the last 8760 hours. HbA1C: No results for input(s): "HGBA1C" in the last 72 hours. CBG: No results for input(s): "GLUCAP" in the last 168 hours. Lipid Profile: No results for input(s): "CHOL", "HDL", "LDLCALC", "TRIG", "CHOLHDL", "LDLDIRECT" in the last 72 hours. Thyroid Function Tests: No results for input(s): "TSH", "T4TOTAL", "FREET4", "T3FREE", "THYROIDAB" in the last 72 hours. Anemia Panel: No results for input(s): "VITAMINB12", "FOLATE", "FERRITIN", "TIBC", "IRON", "RETICCTPCT" in the last 72 hours. Sepsis Labs: No results for input(s): "PROCALCITON", "LATICACIDVEN" in the last 168 hours.  Recent Results (from the past 240 hour(s))  Respiratory (~20 pathogens) panel by PCR     Status: Abnormal   Collection Time: 10/25/22  4:57 PM   Specimen: Nasopharyngeal Swab; Respiratory  Result Value Ref Range Status   Adenovirus NOT DETECTED NOT DETECTED Final   Coronavirus 229E NOT DETECTED NOT DETECTED Final    Comment: (NOTE) The Coronavirus on the Respiratory Panel, DOES NOT test for the novel  Coronavirus (2019 nCoV)    Coronavirus HKU1 NOT DETECTED NOT DETECTED Final   Coronavirus NL63 NOT DETECTED NOT DETECTED Final   Coronavirus OC43 NOT DETECTED  NOT DETECTED Final   Metapneumovirus NOT DETECTED NOT DETECTED Final   Rhinovirus / Enterovirus DETECTED (A) NOT DETECTED Final   Influenza A NOT DETECTED NOT DETECTED Final   Influenza B NOT DETECTED NOT DETECTED Final   Parainfluenza Virus 1 NOT DETECTED NOT DETECTED Final   Parainfluenza Virus 2 NOT DETECTED NOT DETECTED Final   Parainfluenza  Virus 3 NOT DETECTED NOT DETECTED Final   Parainfluenza Virus 4 NOT DETECTED NOT DETECTED Final   Respiratory Syncytial Virus NOT DETECTED NOT DETECTED Final   Bordetella pertussis NOT DETECTED NOT DETECTED Final   Bordetella Parapertussis NOT DETECTED NOT DETECTED Final   Chlamydophila pneumoniae NOT DETECTED NOT DETECTED Final   Mycoplasma pneumoniae NOT DETECTED NOT DETECTED Final    Comment: Performed at [facility], [place] [address], [place], [place] [address]  Resp Panel by RT-PCR (Flu A&B, Covid) Anterior Nasal Swab     Status: None   Collection Time: 11/02/22  4:33 AM   Specimen: Anterior Nasal Swab  Result Value Ref Range Status   SARS Coronavirus 2 by RT PCR NEGATIVE NEGATIVE Final    Comment: (NOTE) SARS-CoV-2 target nucleic acids are NOT DETECTED.  The SARS-CoV-2 RNA is generally detectable in upper respiratory specimens during the acute phase of infection. The lowest concentration of SARS-CoV-2 viral copies this assay can detect is 138 copies/mL. A negative result does not preclude SARS-Cov-2 infection and should not be used as the sole basis for treatment or other patient management decisions. A negative result may occur with  improper specimen collection/handling, submission of specimen other than nasopharyngeal swab, presence of viral mutation(s) within the areas targeted by this assay, and inadequate number of viral copies(<138 copies/mL). A negative result must be combined with clinical observations, patient history, and epidemiological information. The expected result is Negative.  Fact Sheet for Patients:  EntrepreneurPulse.com.au  Fact Sheet for Healthcare Providers:  IncredibleEmployment.be  This test is no t yet approved or cleared by the [place] FDA and  has been authorized for detection and/or diagnosis of SARS-CoV-2 by FDA under an Emergency Use Authorization (EUA). This EUA will remain  in effect (meaning  this test can be used) for the duration of the COVID-19 declaration under Section 564(b)(1) of the Act, 21 U.S.C.section 360bbb-3(b)(1), unless the authorization is terminated  or revoked sooner.       Influenza A by PCR NEGATIVE NEGATIVE Final   Influenza B by PCR NEGATIVE NEGATIVE Final    Comment: (NOTE) The Xpert Xpress SARS-CoV-2/FLU/RSV plus assay is intended as an aid in the diagnosis of influenza from Nasopharyngeal swab specimens and should not be used as a sole basis for treatment. Nasal washings and aspirates are unacceptable for Xpert Xpress SARS-CoV-2/FLU/RSV testing.  Fact Sheet for Patients: EntrepreneurPulse.com.au  Fact Sheet for Healthcare Providers: IncredibleEmployment.be  This test is not yet approved or cleared by the [place] FDA and has been authorized for detection and/or diagnosis of SARS-CoV-2 by FDA under an Emergency Use Authorization (EUA). This EUA will remain in effect (meaning this test can be used) for the duration of the COVID-19 declaration under Section 564(b)(1) of the Act, 21 U.S.C. section 360bbb-3(b)(1), unless the authorization is terminated or revoked.  Performed at [facility], [place] [address], [place], [place] [address]     Radiology Studies: No results found.  Scheduled Meds:  ARIPiprazole  7.5 mg Oral Daily   calcitRIOL  0.25 mcg Oral Q M,W,F-HD   calcium acetate  667 mg Oral  TID WC   Chlorhexidine Gluconate Cloth  6 each Topical Q0600   darbepoetin (ARANESP) injection - DIALYSIS  100 mcg Subcutaneous Q Mon-1800   diltiazem  360 mg Oral Daily   hydrALAZINE  25 mg Oral Q8H   isosorbide mononitrate  30 mg Oral Daily   sevelamer carbonate  1,600 mg Oral TID WC   torsemide  100 mg Oral q morning   traZODone  100 mg Oral QHS   Continuous Infusions:  ferric gluconate (FERRLECIT) IVPB       LOS: 1 day    Time spent: 50 mins    Madicyn Mesina, MD [facility]   If 7PM-7AM, please contact night-coverage

## 2022-11-05 ENCOUNTER — Inpatient Hospital Stay (HOSPITAL_COMMUNITY): Payer: Medicaid Other

## 2022-11-05 DIAGNOSIS — I3139 Other pericardial effusion (noninflammatory): Secondary | ICD-10-CM | POA: Diagnosis not present

## 2022-11-05 DIAGNOSIS — E8770 Fluid overload, unspecified: Secondary | ICD-10-CM | POA: Diagnosis not present

## 2022-11-05 LAB — MRSA NEXT GEN BY PCR, NASAL: MRSA by PCR Next Gen: DETECTED — AB

## 2022-11-05 LAB — ECHOCARDIOGRAM LIMITED
Height: 68 in
Weight: 3153.46 oz

## 2022-11-05 MED ORDER — HYDRALAZINE HCL 50 MG PO TABS
50.0000 mg | ORAL_TABLET | Freq: Two times a day (BID) | ORAL | Status: DC
  Administered 2022-11-06: 50 mg via ORAL
  Filled 2022-11-05 (×2): qty 1

## 2022-11-05 MED ORDER — CARVEDILOL 3.125 MG PO TABS
3.1250 mg | ORAL_TABLET | Freq: Two times a day (BID) | ORAL | Status: DC
  Administered 2022-11-07: 3.125 mg via ORAL
  Filled 2022-11-05 (×3): qty 1

## 2022-11-05 MED ORDER — MUPIROCIN 2 % EX OINT
1.0000 | TOPICAL_OINTMENT | Freq: Two times a day (BID) | CUTANEOUS | Status: AC
  Administered 2022-11-05 – 2022-11-10 (×6): 1 via NASAL
  Filled 2022-11-05 (×3): qty 22

## 2022-11-05 MED ORDER — ISOSORBIDE MONONITRATE ER 60 MG PO TB24
60.0000 mg | ORAL_TABLET | Freq: Every day | ORAL | Status: DC
  Administered 2022-11-06 – 2022-11-10 (×5): 60 mg via ORAL
  Filled 2022-11-05 (×6): qty 1

## 2022-11-05 NOTE — Progress Notes (Signed)
PROGRESS NOTE    Natasha Long  EGB:[id number] DOB: [birth year] DOA: 11/01/2022 PCP: [name], MD   Brief Narrative:  This 26 yrs old female with PMH significant of asthma, hypertension, chronic combined systolic heart failure,, chronic non-specific chest pain, severe mitral regurgitation, pericardial effusion without cardiac tamponade, pleural effusion, focal glomerulosclerosis, ESRD on hemodialysis, secondary hyperparathyroidism, iron deficiency / ESRD anemia, class I obesity, borderline personality disorder, recurrent depression without psychotic features, polysubstance abuse (cannabis, cocaine, nicotine,  occasional alcohol use), PTSD, homelessness, insomnia, peripheral neuropathy, syphilis, septic arthritis of multiple joints who was recently admitted for acute respiratory failure in the setting of volume overload causing pulmonary edema, rhinovirus infection, hypertensive urgency and discharged to the local jail.  Patient again presents at this time with substernal chest pain for 2 days.  Not a good historian.  In the ED, patient had stable vitals including blood pressure.  Labs showed hemoglobin of 8.5 with elevated troponin at 365 and then 366 ng/L.  BNP was 1485.8 pg/mL.  BMP showed  BUN of 49 and creatinine of 8.93 mg/dL.  Chest x-ray showed moderate large heart silhouette with central vascular prominence without overt edema.  Moderate to large right and small left pleural effusions with overlying atelectasis or consolidations similar in appearance to 10/25/2022. Patient was then admitted hospital for further evaluation and treatment.   Assessment & Plan:   Principal Problem:   Volume overload Active Problems:   Primary hypertension   Elevated troponin   Pericardial effusion   Anemia in chronic kidney disease   Severe recurrent major depression without psychotic features (HCC)   Borderline personality disorder (HCC)   ESRD (end stage renal disease) (HCC)   Moderate  protein-calorie malnutrition (HCC)   Acute combined systolic (congestive) and diastolic (congestive) heart failure (HCC)   Mitral valve insufficiency   ESRD on hemodialysis (HCC)   Chest pain   Volume overload: ESRD on hemodialysis: Acute on chronic combined systolic and diastolic heart failure Patient presented with severe shortness of breath. Continue telemetry monitoring, oxygen supplementation, continue torsemide 100 mg daily.   Continue intake and output charting, daily weights.  No ACE inhibitor's or beta-blocker.   Cardiology and Nephrology has been consulted.  Patient has undergone hemodialysis at this time.   2D echo June 2023 with LV ejection fraction of 45 to 50%.  Coumadin on hold to CHF.   Plans to repeat 2D echocardiogram when volume status is euvolemic.   Counseled her regarding fluid restriction and compliance with dialysis.   Chronic chest pain, elevated troponin: EKG nonischemic.  Troponin elevated but not uptrending. Cardiology recommends outpatient follow-up with stress test and aggressive hemodialysis.   Essential hypertension: Continue Cardizem and torsemide.   Cardiology recommends Imdur 30 and hydralazine 25 mg 3 times daily as tolerated.   Blood pressure is reasonably controlled.   Pericardial effusion without tamponade: Chest pain could be uremic pericarditis. Controlled. Echo shows pericardial effusion, seems consistent with uremic pericarditis with pericardial effusion. She may eventually require pericardial window if it does not get better with hemodialysis.  ESRD on hemodialysis: Nephrology on board.   Continue dialysis as per nephro . counseled her regarding need for full attendance with dialysis.   Anemia of chronic kidney disease: Erythropoietin as per nephrology.   Major depression recurrent. Borderline personality disorder Continue Abilify, hydroxyzine, trazodone.   Moderate protein-calorie malnutrition ([place]) Nutrition has been  consulted.   DVT prophylaxis: SCDs Code Status: Full code Family Communication: No family at bedside Disposition Plan:  Status is: Inpatient Remains inpatient appropriate because: To [facility] likely in 1-2 days.   Consultants:  Cardiology Nephrology  Procedures:  Hemodialysis Antimicrobials:  None  Subjective: Patient was seen and examined at bedside.  Overnight events noted. Patient continues to complain of shortness of breath. She has completed hemodialysis today.  Objective: Vitals:   11/04/22 2015 11/04/22 2048 11/05/22 0546 11/05/22 0851  BP: 130/76 132/72 129/81 133/71  Pulse: 95 97 95 86  Resp: (!) 23 18 18 18   Temp: 98.7 F (37.1 C) 98.9 F (37.2 C) 98.6 F (37 C)   TempSrc: Oral Oral Oral Oral  SpO2: 98% 100% 100% 100%  Weight: 89.4 kg     Height:        Intake/Output Summary (Last 24 hours) at 11/05/2022 1409 Last data filed at 11/05/2022 0843 Gross per 24 hour  Intake 480 ml  Output 4000 ml  Net -3520 ml   Filed Weights   11/03/22 0125 11/04/22 1530 11/04/22 2015  Weight: 87.6 kg 93.4 kg 89.4 kg    Examination:  General exam: Appears comfortable, not in any acute distress.  Deconditioned Respiratory system: Decreased breath sounds, no accessory muscle use, respiratory effort normal, RR 15 Cardiovascular system: S1-S2 heard, regular rate and rhythm, no murmur. Gastrointestinal system: Abdomen is soft, non distended, non tender, BS+ Central nervous system: Alert and oriented x 3, no focal neurological deficits. Extremities: Edema+,  No cyanosis, no clubbing Skin: No rashes, lesions or ulcers Psychiatry: Judgement and insight appear normal. Mood & affect appropriate.     Data Reviewed: I have personally reviewed following labs and imaging studies  CBC: Recent Labs  Lab 11/02/22 0239  WBC 9.7  HGB 8.5*  HCT 28.1*  MCV 100.4*  PLT 540   Basic Metabolic Panel: Recent Labs  Lab 11/02/22 0239  NA 139  K 4.7  CL 100  CO2  27  GLUCOSE 89  BUN 49*  CREATININE 8.93*  CALCIUM 9.0   GFR: Estimated Creatinine Clearance: 11 mL/min (A) (by C-G formula based on SCr of 8.93 mg/dL (H)). Liver Function Tests: No results for input(s): "AST", "ALT", "ALKPHOS", "BILITOT", "PROT", "ALBUMIN" in the last 168 hours. No results for input(s): "LIPASE", "AMYLASE" in the last 168 hours. No results for input(s): "AMMONIA" in the last 168 hours. Coagulation Profile: No results for input(s): "INR", "PROTIME" in the last 168 hours. Cardiac Enzymes: No results for input(s): "CKTOTAL", "CKMB", "CKMBINDEX", "TROPONINI" in the last 168 hours. BNP (last 3 results) No results for input(s): "PROBNP" in the last 8760 hours. HbA1C: No results for input(s): "HGBA1C" in the last 72 hours. CBG: No results for input(s): "GLUCAP" in the last 168 hours. Lipid Profile: No results for input(s): "CHOL", "HDL", "LDLCALC", "TRIG", "CHOLHDL", "LDLDIRECT" in the last 72 hours. Thyroid Function Tests: No results for input(s): "TSH", "T4TOTAL", "FREET4", "T3FREE", "THYROIDAB" in the last 72 hours. Anemia Panel: No results for input(s): "VITAMINB12", "FOLATE", "FERRITIN", "TIBC", "IRON", "RETICCTPCT" in the last 72 hours. Sepsis Labs: No results for input(s): "PROCALCITON", "LATICACIDVEN" in the last 168 hours.  Recent Results (from the past 240 hour(s))  Resp Panel by RT-PCR (Flu A&B, Covid) Anterior Nasal Swab     Status: None   Collection Time: 11/02/22  4:33 AM   Specimen: Anterior Nasal Swab  Result Value Ref Range Status   SARS Coronavirus 2 by RT PCR NEGATIVE NEGATIVE Final    Comment: (NOTE) SARS-CoV-2 target nucleic acids are NOT DETECTED.  The SARS-CoV-2 RNA is generally detectable in  upper respiratory specimens during the acute phase of infection. The lowest concentration of SARS-CoV-2 viral copies this assay can detect is 138 copies/mL. A negative result does not preclude SARS-Cov-2 infection and should not be used as the sole  basis for treatment or other patient management decisions. A negative result may occur with  improper specimen collection/handling, submission of specimen other than nasopharyngeal swab, presence of viral mutation(s) within the areas targeted by this assay, and inadequate number of viral copies(<138 copies/mL). A negative result must be combined with clinical observations, patient history, and epidemiological information. The expected result is Negative.  Fact Sheet for Patients:  EntrepreneurPulse.com.au  Fact Sheet for Healthcare Providers:  IncredibleEmployment.be  This test is no t yet approved or cleared by the [place] FDA and  has been authorized for detection and/or diagnosis of SARS-CoV-2 by FDA under an Emergency Use Authorization (EUA). This EUA will remain  in effect (meaning this test can be used) for the duration of the COVID-19 declaration under Section 564(b)(1) of the Act, 21 U.S.C.section 360bbb-3(b)(1), unless the authorization is terminated  or revoked sooner.       Influenza A by PCR NEGATIVE NEGATIVE Final   Influenza B by PCR NEGATIVE NEGATIVE Final    Comment: (NOTE) The Xpert Xpress SARS-CoV-2/FLU/RSV plus assay is intended as an aid in the diagnosis of influenza from Nasopharyngeal swab specimens and should not be used as a sole basis for treatment. Nasal washings and aspirates are unacceptable for Xpert Xpress SARS-CoV-2/FLU/RSV testing.  Fact Sheet for Patients: EntrepreneurPulse.com.au  Fact Sheet for Healthcare Providers: IncredibleEmployment.be  This test is not yet approved or cleared by the [place] FDA and has been authorized for detection and/or diagnosis of SARS-CoV-2 by FDA under an Emergency Use Authorization (EUA). This EUA will remain in effect (meaning this test can be used) for the duration of the COVID-19 declaration under Section 564(b)(1) of the Act,  21 U.S.C. section 360bbb-3(b)(1), unless the authorization is terminated or revoked.  Performed at [facility], [place] [address], [place],  [address]     Radiology Studies: ECHOCARDIOGRAM COMPLETE  Result Date: 11/04/2022    ECHOCARDIOGRAM REPORT   Patient Name:   Natasha Long Date of Exam: 11/04/2022 Medical Rec #:  [id number]         Height:       68.0 in Accession #:    [id number]        Weight:       205.9 lb Date of Birth:  10-Apr-1996         BSA:          2.069 m Patient Age:    26 years          BP:           129/81 mmHg Patient Gender: F                 HR:           91 bpm. Exam Location:  Inpatient Procedure: 2D Echo, Cardiac Doppler and Color Doppler Indications:    Pericardial effusion  History:        Patient has prior history of Echocardiogram examinations, most                 recent 10/25/2022. CHF, Signs/Symptoms:Chest Pain and Shortness                 of Breath; Risk Factors:Hypertension and Current Smoker. ESRD on  Dialysis, Polysubstance abuse.  Sonographer:    Wenda Low Referring Phys: [place]  1. Left ventricular ejection fraction, by estimation, is 55 to 60%. The left ventricle has normal function. The left ventricle has no regional wall motion abnormalities. There is severe concentric left ventricular hypertrophy. Left ventricular diastolic  parameters are indeterminate. Elevated left ventricular end-diastolic pressure.  2. Right ventricular systolic function is normal. The right ventricular size is mildly enlarged. There is moderately elevated pulmonary artery systolic pressure. The estimated right ventricular systolic pressure is 38.1 mmHg.  3. The mitral valve is degenerative. Moderate mitral valve regurgitation. Moderate mitral stenosis. The mean mitral valve gradient is 6.0 mmHg. Moderate mitral annular calcification.  4. Tricuspid valve regurgitation is moderate to severe.  5. The aortic valve is  tricuspid. Aortic valve regurgitation is trivial. Moderate aortic valve stenosis. Aortic valve area, by VTI measures 1.37 cm. Aortic valve mean gradient measures 24.0 mmHg. Aortic valve Vmax measures 3.42 m/s.  6. The inferior vena cava is dilated in size with <50% respiratory variability, suggesting right atrial pressure of 15 mmHg.  7. Moderate to large circumferential pericardial effusion with greatest diameter posterior to the LV measuring 3.02cm. There is no RV diastolic collapse or RA inversion. There is excessive respiratory variation in the mitral valve spectral Doppler velocities. The IVC is dilated and plethoric.  8. Compared to prior study dated 10/25/2022, the pericardial effusion has increased in size and now there is excessive respiratory variation in MV doppler velocities but no RV diastolic colllpase or RA inversion. Clinical correlation recommended. FINDINGS  Left Ventricle: Left ventricular ejection fraction, by estimation, is 55 to 60%. The left ventricle has normal function. The left ventricle has no regional wall motion abnormalities. The left ventricular internal cavity size was normal in size. There is  severe concentric left ventricular hypertrophy. Left ventricular diastolic parameters are indeterminate. Elevated left ventricular end-diastolic pressure. Right Ventricle: The right ventricular size is mildly enlarged. No increase in right ventricular wall thickness. Right ventricular systolic function is normal. There is moderately elevated pulmonary artery systolic pressure. The tricuspid regurgitant velocity is 2.99 m/s, and with an assumed right atrial pressure of 15 mmHg, the estimated right ventricular systolic pressure is 82.9 mmHg. Left Atrium: Left atrial size was normal in size. Right Atrium: Right atrial size was normal in size. Pericardium: Moderate to large circumferential pericardial effusion with greatest diameter posterior to the LV measuring 3.02cm. There is no RV diastolic  collapse or RA inversion. A moderately sized pericardial effusion is present. The pericardial effusion is circumferential. There is excessive respiratory variation in the mitral valve spectral Doppler velocities. Mitral Valve: The mitral valve is degenerative in appearance. There is moderate thickening of the mitral valve leaflet(s). Moderate mitral annular calcification. Moderate mitral valve regurgitation. Moderate mitral valve stenosis. MV peak gradient, 11.0 mmHg. The mean mitral valve gradient is 6.0 mmHg. Tricuspid Valve: The tricuspid valve is normal in structure. Tricuspid valve regurgitation is moderate to severe. No evidence of tricuspid stenosis. Aortic Valve: The aortic valve is tricuspid. Aortic valve regurgitation is trivial. Moderate aortic stenosis is present. Aortic valve mean gradient measures 24.0 mmHg. Aortic valve peak gradient measures 46.9 mmHg. Aortic valve area, by VTI measures 1.37  cm. Pulmonic Valve: The pulmonic valve was normal in structure. Pulmonic valve regurgitation is not visualized. No evidence of pulmonic stenosis. Aorta: The aortic root is normal in size and structure. Venous: The inferior vena cava is dilated in size with less than 50% respiratory  variability, suggesting right atrial pressure of 15 mmHg. IAS/Shunts: No atrial level shunt detected by color flow Doppler.  LEFT VENTRICLE PLAX 2D LVIDd:         4.80 cm   Diastology LVIDs:         3.20 cm   LV e' medial:    8.70 cm/s LV PW:         1.60 cm   LV E/e' medial:  18.2 LV IVS:        1.60 cm   LV e' lateral:   6.64 cm/s LVOT diam:     2.00 cm   LV E/e' lateral: 23.8 LV SV:         81 LV SV Index:   39 LVOT Area:     3.14 cm  RIGHT VENTRICLE RV Basal diam:  3.90 cm RV Mid diam:    3.10 cm RV S prime:     10.90 cm/s TAPSE (M-mode): 2.2 cm LEFT ATRIUM             Index        RIGHT ATRIUM           Index LA diam:        4.20 cm 2.03 cm/m   RA Area:     18.60 cm LA Vol (A2C):   88.1 ml 42.58 ml/m  RA Volume:   54.30 ml   26.24 ml/m LA Vol (A4C):   51.3 ml 24.79 ml/m LA Biplane Vol: 67.3 ml 32.52 ml/m  AORTIC VALVE                     PULMONIC VALVE AV Area (Vmax):    1.29 cm      PV Vmax:       1.49 m/s AV Area (Vmean):   1.35 cm      PV Peak grad:  8.9 mmHg AV Area (VTI):     1.37 cm AV Vmax:           342.25 cm/s AV Vmean:          223.750 cm/s AV VTI:            0.593 m AV Peak Grad:      46.9 mmHg AV Mean Grad:      24.0 mmHg LVOT Vmax:         140.00 cm/s LVOT Vmean:        96.200 cm/s LVOT VTI:          0.259 m LVOT/AV VTI ratio: 0.44  AORTA Ao Root diam: 3.20 cm Ao Asc diam:  3.60 cm MITRAL VALVE                  TRICUSPID VALVE MV Area (PHT): 2.86 cm       TR Peak grad:   35.8 mmHg MV Area VTI:   1.85 cm       TR Vmax:        299.00 cm/s MV Peak grad:  11.0 mmHg MV Mean grad:  6.0 mmHg       SHUNTS MV Vmax:       1.66 m/s       Systemic VTI:  0.26 m MV Vmean:      116.0 cm/s     Systemic Diam: 2.00 cm MV Decel Time: 265 msec MR Peak grad:    129.5 mmHg MR Mean grad:    84.0 mmHg MR Vmax:  569.00 cm/s MR Vmean:        436.0 cm/s MR PISA:         1.01 cm MR PISA Eff ROA: 16 mm MR PISA Radius:  0.40 cm MV E velocity: 158.00 cm/s MV A velocity: 112.00 cm/s MV E/A ratio:  1.41 Fransico Him MD Electronically signed by Fransico Him MD Signature Date/Time: 11/04/2022/10:00:13 PM    Final     Scheduled Meds:  ARIPiprazole  7.5 mg Oral Daily   calcitRIOL  0.25 mcg Oral Q M,W,F-HD   calcium acetate  667 mg Oral TID WC   [START ON 11/06/2022] carvedilol  3.125 mg Oral BID WC   Chlorhexidine Gluconate Cloth  6 each Topical Q0600   darbepoetin (ARANESP) injection - DIALYSIS  100 mcg Subcutaneous Q Mon-1800   hydrALAZINE  50 mg Oral BID   hydrOXYzine  25 mg Oral Once   [START ON 11/06/2022] isosorbide mononitrate  60 mg Oral Daily   sevelamer carbonate  1,600 mg Oral TID WC   torsemide  100 mg Oral q morning   traZODone  100 mg Oral QHS   Continuous Infusions:  ferric gluconate (FERRLECIT) IVPB Stopped  (11/04/22 1910)     LOS: 2 days    Time spent: 35 mins    Omesha Bowerman, MD [facility]   If 7PM-7AM, please contact night-coverage

## 2022-11-05 NOTE — Progress Notes (Signed)
Hemodynamically stable today.  Patient refused repeat echo today to reassess pericardial effusion for tamponade.

## 2022-11-05 NOTE — Progress Notes (Incomplete)
Echocardiogram attempted, patient refused and will not cooperate 2:16pm

## 2022-11-05 NOTE — Progress Notes (Signed)
[facility] Progress Note   Subjective:  Seen in room - laying in bed. S/p HD yesterday - RN had paged me that she was beligerent whle on HD, did finish her treatment with 4L removed. Will adjust medications per cardiology recommendations - d/c diltiazem, restart low dose Coreg, ^ iso/hydral - see if tolerates.  Objective Vitals:   11/04/22 2015 11/04/22 2048 11/05/22 0546 11/05/22 0851  BP: 130/76 132/72 129/81 133/71  Pulse: 95 97 95 86  Resp: (!) 23 18 18 18   Temp: 98.7 F (37.1 C) 98.9 F (37.2 C) 98.6 F (37 C)   TempSrc: Oral Oral Oral Oral  SpO2: 98% 100% 100% 100%  Weight: 89.4 kg     Height:       Physical Exam General: Chronically ill appearing woman, NAD. [place] O2 in place Heart: RRR; no murmur Lungs: CTAB; no rales Abdomen: soft Extremities: Tense 1+ LE edema; dependent L forearm edema Dialysis Access: LUE AVF + thrill/bruit  Additional Objective Labs: Basic Metabolic Panel: Recent Labs  Lab 11/02/22 0239  NA 139  K 4.7  CL 100  CO2 27  GLUCOSE 89  BUN 49*  CREATININE 8.93*  CALCIUM 9.0   CBC: Recent Labs  Lab 11/02/22 0239  WBC 9.7  HGB 8.5*  HCT 28.1*  MCV 100.4*  PLT 300   Studies/Results: ECHOCARDIOGRAM COMPLETE  Result Date: 11/04/2022    ECHOCARDIOGRAM REPORT   Patient Name:   ALOHA BARTOK Date of Exam: 11/04/2022 Medical Rec #:  [id number]         Height:       68.0 in Accession #:    [id number]        Weight:       205.9 lb Date of Birth:  [birth year]         BSA:          2.069 m Patient Age:    26 years          BP:           129/81 mmHg Patient Gender: F                 HR:           91 bpm. Exam Location:  Inpatient Procedure: 2D Echo, Cardiac Doppler and Color Doppler Indications:    Pericardial effusion  History:        Patient has prior history of Echocardiogram examinations, most                 recent 10/25/2022. CHF, Signs/Symptoms:Chest Pain and Shortness                 of Breath; Risk Factors:Hypertension and  Current Smoker. ESRD on                 Dialysis, Polysubstance abuse.  Sonographer:    Wenda Low Referring Phys: [place]  1. Left ventricular ejection fraction, by estimation, is 55 to 60%. The left ventricle has normal function. The left ventricle has no regional wall motion abnormalities. There is severe concentric left ventricular hypertrophy. Left ventricular diastolic  parameters are indeterminate. Elevated left ventricular end-diastolic pressure.  2. Right ventricular systolic function is normal. The right ventricular size is mildly enlarged. There is moderately elevated pulmonary artery systolic pressure. The estimated right ventricular systolic pressure is 96.7 mmHg.  3. The mitral valve is degenerative. Moderate mitral valve regurgitation. Moderate mitral stenosis. The mean mitral valve gradient is  6.0 mmHg. Moderate mitral annular calcification.  4. Tricuspid valve regurgitation is moderate to severe.  5. The aortic valve is tricuspid. Aortic valve regurgitation is trivial. Moderate aortic valve stenosis. Aortic valve area, by VTI measures 1.37 cm. Aortic valve mean gradient measures 24.0 mmHg. Aortic valve Vmax measures 3.42 m/s.  6. The inferior vena cava is dilated in size with <50% respiratory variability, suggesting right atrial pressure of 15 mmHg.  7. Moderate to large circumferential pericardial effusion with greatest diameter posterior to the LV measuring 3.02cm. There is no RV diastolic collapse or RA inversion. There is excessive respiratory variation in the mitral valve spectral Doppler velocities. The IVC is dilated and plethoric.  8. Compared to prior study dated 10/25/2022, the pericardial effusion has increased in size and now there is excessive respiratory variation in MV doppler velocities but no RV diastolic colllpase or RA inversion. Clinical correlation recommended. FINDINGS  Left Ventricle: Left ventricular ejection fraction, by estimation, is 55 to  60%. The left ventricle has normal function. The left ventricle has no regional wall motion abnormalities. The left ventricular internal cavity size was normal in size. There is  severe concentric left ventricular hypertrophy. Left ventricular diastolic parameters are indeterminate. Elevated left ventricular end-diastolic pressure. Right Ventricle: The right ventricular size is mildly enlarged. No increase in right ventricular wall thickness. Right ventricular systolic function is normal. There is moderately elevated pulmonary artery systolic pressure. The tricuspid regurgitant velocity is 2.99 m/s, and with an assumed right atrial pressure of 15 mmHg, the estimated right ventricular systolic pressure is 25.9 mmHg. Left Atrium: Left atrial size was normal in size. Right Atrium: Right atrial size was normal in size. Pericardium: Moderate to large circumferential pericardial effusion with greatest diameter posterior to the LV measuring 3.02cm. There is no RV diastolic collapse or RA inversion. A moderately sized pericardial effusion is present. The pericardial effusion is circumferential. There is excessive respiratory variation in the mitral valve spectral Doppler velocities. Mitral Valve: The mitral valve is degenerative in appearance. There is moderate thickening of the mitral valve leaflet(s). Moderate mitral annular calcification. Moderate mitral valve regurgitation. Moderate mitral valve stenosis. MV peak gradient, 11.0 mmHg. The mean mitral valve gradient is 6.0 mmHg. Tricuspid Valve: The tricuspid valve is normal in structure. Tricuspid valve regurgitation is moderate to severe. No evidence of tricuspid stenosis. Aortic Valve: The aortic valve is tricuspid. Aortic valve regurgitation is trivial. Moderate aortic stenosis is present. Aortic valve mean gradient measures 24.0 mmHg. Aortic valve peak gradient measures 46.9 mmHg. Aortic valve area, by VTI measures 1.37  cm. Pulmonic Valve: The pulmonic valve was  normal in structure. Pulmonic valve regurgitation is not visualized. No evidence of pulmonic stenosis. Aorta: The aortic root is normal in size and structure. Venous: The inferior vena cava is dilated in size with less than 50% respiratory variability, suggesting right atrial pressure of 15 mmHg. IAS/Shunts: No atrial level shunt detected by color flow Doppler.  LEFT VENTRICLE PLAX 2D LVIDd:         4.80 cm   Diastology LVIDs:         3.20 cm   LV e' medial:    8.70 cm/s LV PW:         1.60 cm   LV E/e' medial:  18.2 LV IVS:        1.60 cm   LV e' lateral:   6.64 cm/s LVOT diam:     2.00 cm   LV E/e' lateral: 23.8 LV SV:  81 LV SV Index:   39 LVOT Area:     3.14 cm  RIGHT VENTRICLE RV Basal diam:  3.90 cm RV Mid diam:    3.10 cm RV S prime:     10.90 cm/s TAPSE (M-mode): 2.2 cm LEFT ATRIUM             Index        RIGHT ATRIUM           Index LA diam:        4.20 cm 2.03 cm/m   RA Area:     18.60 cm LA Vol (A2C):   88.1 ml 42.58 ml/m  RA Volume:   54.30 ml  26.24 ml/m LA Vol (A4C):   51.3 ml 24.79 ml/m LA Biplane Vol: 67.3 ml 32.52 ml/m  AORTIC VALVE                     PULMONIC VALVE AV Area (Vmax):    1.29 cm      PV Vmax:       1.49 m/s AV Area (Vmean):   1.35 cm      PV Peak grad:  8.9 mmHg AV Area (VTI):     1.37 cm AV Vmax:           342.25 cm/s AV Vmean:          223.750 cm/s AV VTI:            0.593 m AV Peak Grad:      46.9 mmHg AV Mean Grad:      24.0 mmHg LVOT Vmax:         140.00 cm/s LVOT Vmean:        96.200 cm/s LVOT VTI:          0.259 m LVOT/AV VTI ratio: 0.44  AORTA Ao Root diam: 3.20 cm Ao Asc diam:  3.60 cm MITRAL VALVE                  TRICUSPID VALVE MV Area (PHT): 2.86 cm       TR Peak grad:   35.8 mmHg MV Area VTI:   1.85 cm       TR Vmax:        299.00 cm/s MV Peak grad:  11.0 mmHg MV Mean grad:  6.0 mmHg       SHUNTS MV Vmax:       1.66 m/s       Systemic VTI:  0.26 m MV Vmean:      116.0 cm/s     Systemic Diam: 2.00 cm MV Decel Time: 265 msec MR Peak grad:    129.5 mmHg  MR Mean grad:    84.0 mmHg MR Vmax:         569.00 cm/s MR Vmean:        436.0 cm/s MR PISA:         1.01 cm MR PISA Eff ROA: 16 mm MR PISA Radius:  0.40 cm MV E velocity: 158.00 cm/s MV A velocity: 112.00 cm/s MV E/A ratio:  1.41 Fransico Him MD Electronically signed by Fransico Him MD Signature Date/Time: 11/04/2022/10:00:13 PM    Final     Medications:  ferric gluconate (FERRLECIT) IVPB Stopped (11/04/22 1910)    ARIPiprazole  7.5 mg Oral Daily   calcitRIOL  0.25 mcg Oral Q M,W,F-HD   calcium acetate  667 mg Oral TID WC   Chlorhexidine Gluconate Cloth  6 each Topical Q0600  darbepoetin (ARANESP) injection - DIALYSIS  100 mcg Subcutaneous Q Mon-1800   diltiazem  360 mg Oral Daily   hydrALAZINE  25 mg Oral Q8H   hydrOXYzine  25 mg Oral Once   isosorbide mononitrate  30 mg Oral Daily   sevelamer carbonate  1,600 mg Oral TID WC   torsemide  100 mg Oral q morning   traZODone  100 mg Oral QHS    Dialysis Orders: MWF at [place] 4hr, BFR 400, EDW 92kg -> down to 89kg here -> needs to go further, 2K/2Ca, LUE AVF - Mircera 227mcg Iv q 2 weeks   Assessment/Plan: ESRD: Usual MWF schedule - next HD tomorrow (12/8) Volume/ hypertension: with severe LVH. Losing weight in jail, will lower EDW and continue to challenge as tolerated. On torsemide 100mg  daily, cardizem 360mg  daily as well as hydralazine and isosorbide. Adjusting meds per cardiology recommendations: d/c diltiazem, start low dose coreg, ^ iso/hydralazine.   Anemia of Chronic Kidney Disease: Hemoglobin 8.5. Given Aranesp 184mcg on 12/4. Recent 11/26 iron sat 6%, will give IV iron load here.  Secondary Hyperparathyroidism/Hyperphosphatemia: Phos 8.4. Continue home phoslo and calcitriol Vascular access: LUE AVF with no issues Chest pain: Trops high but stable - likely related to demand - cardiology following. Volume removal with HD as tolerated.  Plan for outpt nuc stress test.  Severe TR: plans to repeat TTE when euvolemic - cardiology  following Pericardial effusion: Cardiology following.  [name], PA-C 11/05/2022, 10:25 AM  [employer]

## 2022-11-05 NOTE — Progress Notes (Signed)
  Echocardiogram 2D Echocardiogram has been performed.  Natasha Long 11/05/2022, 5:30 PM

## 2022-11-06 ENCOUNTER — Encounter (HOSPITAL_COMMUNITY): Payer: Self-pay | Admitting: Certified Registered"

## 2022-11-06 DIAGNOSIS — I059 Rheumatic mitral valve disease, unspecified: Secondary | ICD-10-CM | POA: Diagnosis not present

## 2022-11-06 DIAGNOSIS — N186 End stage renal disease: Secondary | ICD-10-CM

## 2022-11-06 DIAGNOSIS — Z992 Dependence on renal dialysis: Secondary | ICD-10-CM

## 2022-11-06 DIAGNOSIS — I5031 Acute diastolic (congestive) heart failure: Secondary | ICD-10-CM

## 2022-11-06 DIAGNOSIS — I3139 Other pericardial effusion (noninflammatory): Secondary | ICD-10-CM

## 2022-11-06 DIAGNOSIS — E8770 Fluid overload, unspecified: Secondary | ICD-10-CM | POA: Diagnosis not present

## 2022-11-06 LAB — HEMOGLOBIN AND HEMATOCRIT, BLOOD
HCT: 23.6 % — ABNORMAL LOW (ref 36.0–46.0)
Hemoglobin: 7.1 g/dL — ABNORMAL LOW (ref 12.0–15.0)

## 2022-11-06 LAB — PREPARE RBC (CROSSMATCH)

## 2022-11-06 MED ORDER — PENTAFLUOROPROP-TETRAFLUOROETH EX AERO
INHALATION_SPRAY | CUTANEOUS | Status: AC
  Filled 2022-11-06: qty 30

## 2022-11-06 MED ORDER — HEPARIN SODIUM (PORCINE) 1000 UNIT/ML DIALYSIS: 20.0000 [IU]/kg | Freq: Once | INTRAMUSCULAR | Status: DC

## 2022-11-06 MED ORDER — CEFAZOLIN SODIUM-DEXTROSE 1-4 GM/50ML-% IV SOLN
1.0000 g | INTRAVENOUS | Status: DC
  Filled 2022-11-06: qty 50

## 2022-11-06 MED ORDER — HYDRALAZINE HCL 20 MG/ML IJ SOLN: 10.0000 mg | Freq: Four times a day (QID) | INTRAMUSCULAR | Status: DC | PRN

## 2022-11-06 MED ORDER — HYDRALAZINE HCL 50 MG PO TABS
50.0000 mg | ORAL_TABLET | Freq: Three times a day (TID) | ORAL | Status: DC
  Administered 2022-11-06 – 2022-11-10 (×10): 50 mg via ORAL
  Filled 2022-11-06 (×13): qty 1

## 2022-11-06 MED ORDER — SODIUM CHLORIDE 0.9% IV SOLUTION: Freq: Once | INTRAVENOUS | Status: DC

## 2022-11-06 NOTE — Progress Notes (Signed)
Progress Note  Patient Name: Natasha Long Date of Encounter: 11/06/2022  [facility] Cardiologist: Sinclair Grooms, MD   Subjective   Still complaining of ongoing SOB and pleuritic CP.  Repeat 2D echo yesterday with no change in pericardial effusion.   Inpatient Medications    Scheduled Meds:  sodium chloride   Intravenous Once   ARIPiprazole  7.5 mg Oral Daily   calcitRIOL  0.25 mcg Oral Q M,W,F-HD   calcium acetate  667 mg Oral TID WC   carvedilol  3.125 mg Oral BID WC   Chlorhexidine Gluconate Cloth  6 each Topical Q0600   darbepoetin (ARANESP) injection - DIALYSIS  100 mcg Subcutaneous Q Mon-1800   hydrALAZINE  50 mg Oral Q8H   isosorbide mononitrate  60 mg Oral Daily   mupirocin ointment  1 Application Nasal BID   pentafluoroprop-tetrafluoroeth       sevelamer carbonate  1,600 mg Oral TID WC   torsemide  100 mg Oral q morning   traZODone  100 mg Oral QHS   Continuous Infusions:  [START ON 11/07/2022]  ceFAZolin (ANCEF) IV     ferric gluconate (FERRLECIT) IVPB 125 mg (11/06/22 1907)   PRN Meds: acetaminophen **OR** acetaminophen, dicyclomine, guaiFENesin, hydrALAZINE, hydrOXYzine, methocarbamol, ondansetron **OR** ondansetron (ZOFRAN) IV, pentafluoroprop-tetrafluoroeth   Vital Signs    Vitals:   11/06/22 2009 11/06/22 2031 11/06/22 2043 11/06/22 2138  BP: (!) 150/96 (!) 150/99  (!) 150/102  Pulse: (!) 119 (!) 112  (!) 123  Resp: (!) 29 (!) 29  18  Temp: 98.2 F (36.8 C)   99.4 F (37.4 C)  TempSrc: Oral   Oral  SpO2: 100% 100%  93%  Weight:   88.7 kg   Height:        Intake/Output Summary (Last 24 hours) at 11/06/2022 2204 Last data filed at 11/06/2022 2009 Gross per 24 hour  Intake 120 ml  Output 4000 ml  Net -3880 ml       11/06/2022    8:43 PM 11/06/2022    3:15 PM 11/04/2022    8:15 PM  Last 3 Weights  Weight (lbs) 195 lb 8.8 oz 212 lb 4.9 oz 197 lb 1.5 oz  Weight (kg) 88.7 kg 96.3 kg 89.4 kg      Telemetry    NSR- Personally  Reviewed  ECG    No new EKG to review - Personally Reviewed  Physical Exam  GEN: Well nourished, well developed inmild distress due to SOB and CP HEENT: Normal NECK: No JVD; No carotid bruits LYMPHATICS: No lymphadenopathy CARDIAC:RRR, no murmurs, gallops 2/6 SM at LLSB RESPIRATORY:  Clear to auscultation without rales, wheezing or rhonchi  ABDOMEN: Soft, non-tender, non-distended MUSCULOSKELETAL:  No edema; No deformity  SKIN: Warm and dry NEUROLOGIC:  Alert and oriented x 3 PSYCHIATRIC:  Normal affect  Labs    High Sensitivity Troponin:   Recent Labs  Lab 10/24/22 1713 10/25/22 0553 10/25/22 0700 11/02/22 0239 11/02/22 0424  TROPONINIHS 574* 646* 626* 365* 366*       Chemistry Recent Labs  Lab 11/02/22 0239  NA 139  K 4.7  CL 100  CO2 27  GLUCOSE 89  BUN 49*  CREATININE 8.93*  CALCIUM 9.0  GFRNONAA 6*  ANIONGAP 12      Hematology Recent Labs  Lab 11/02/22 0239 11/06/22 0542  WBC 9.7  --   RBC 2.80*  --   HGB 8.5* 7.1*  HCT 28.1* 23.6*  MCV 100.4*  --  MCH 30.4  --   MCHC 30.2  --   RDW 19.0*  --   PLT 300  --      BNP Recent Labs  Lab 11/02/22 0239  BNP 1,485.8*      DDimer No results for input(s): "DDIMER" in the last 168 hours.  Radiology    ECHOCARDIOGRAM LIMITED  Result Date: 11/05/2022    ECHOCARDIOGRAM LIMITED REPORT   Patient Name:   Natasha Long Date of Exam: 11/05/2022 Medical Rec #:  [id number]         Height:       68.0 in Accession #:    [id number]        Weight:       197.1 lb Date of Birth:  November 30, 1996         BSA:          2.031 m Patient Age:    26 years          BP:           133/71 mmHg Patient Gender: F                 HR:           88 bpm. Exam Location:  Inpatient Procedure: Limited Echo and Cardiac Doppler Indications:    Pericardial effusion I31.3  History:        Patient has prior history of Echocardiogram examinations, most                 recent 11/04/2022. CHF, Pericardial Disease, Mitral Valve                  Disease, Signs/Symptoms:Chest Pain; Risk Factors:Hypertension                 and Current Smoker.  Sonographer:    Greer Pickerel Referring Phys: [place] Comments: Image acquisition challenging due to patient body habitus and Image acquisition challenging due to respiratory motion. IMPRESSIONS  1. Large pericardial effusion in the posterior portion of the left ventricle with small-moderate pericardial effusion in the anterior/RV side. There is no RV or RA collapse. IVC not well visualized. There is excessive respiratory variation in the mitral  valve spectral Doppler velocities.  2. Left ventricular ejection fraction, by estimation, is 55 to 60%. The left ventricle has normal function.  3. The mitral valve is normal in structure. not assessed mitral valve regurgitation. not assesed mitral stenosis.  4. Tricuspid valve regurgitation not assessed. not assessed tricuspid stenosis.  5. Aortic valve regurgitation not assessed. not assessed. Comparison(s): No significant change from prior study. FINDINGS  Left Ventricle: Left ventricular ejection fraction, by estimation, is 55 to 60%. The left ventricle has normal function. Pericardium: Large pericardial effusion in the posterior portion of the left ventricle with small-moderate pericardial effusion in the anterior/RV side. There is no RV or RA collapse. IVC not well visualized. There is excessive respiratory variation in the mitral valve spectral Doppler velocities. There is excessive respiratory variation in the mitral valve spectral Doppler velocities. Mitral Valve: The mitral valve is normal in structure. Not assessed mitral valve regurgitation. Not assesed mitral valve stenosis. Tricuspid Valve: Tricuspid valve regurgitation not assessed. not assessed tricuspid stenosis. Aortic Valve: Aortic valve regurgitation not assessed. Not assessed. Additional Comments: Spectral Doppler performed.  Berniece Salines DO Electronically signed by Berniece Salines  DO Signature Date/Time: 11/05/2022/5:39:34 PM    Final     Cardiac Studies  2D echo 11/05/22 IMPRESSIONS  1. Large pericardial effusion in the posterior portion of the left  ventricle with small-moderate pericardial effusion in the anterior/RV  side. There is no RV or RA collapse. IVC not well visualized. There is  excessive respiratory variation in the mitral   valve spectral Doppler velocities.   2. Left ventricular ejection fraction, by estimation, is 55 to 60%. The  left ventricle has normal function.   3. The mitral valve is normal in structure. not assessed mitral valve  regurgitation. not assesed mitral stenosis.   4. Tricuspid valve regurgitation not assessed. not assessed tricuspid  stenosis.   5. Aortic valve regurgitation not assessed. not assessed.     Echo 11/01/2022 IMPRESSIONS    1. Left ventricular ejection fraction, by estimation, is 40 to 45%. Left  ventricular ejection fraction by 3D volume is 43 %. The left ventricle has  mildly decreased function. The left ventricle demonstrates global  hypokinesis. The left ventricular  internal cavity size was mildly dilated. There is severe concentric left  ventricular hypertrophy. Left ventricular diastolic parameters are  consistent with Grade II diastolic dysfunction (pseudonormalization).  Elevated left atrial pressure.   2. Right ventricular systolic function is normal. The right ventricular  size is moderately enlarged. There is severely elevated pulmonary artery  systolic pressure. The estimated right ventricular systolic pressure is  59.9 mmHg.   3. Left atrial size was mildly dilated.   4. Right atrial size was mildly dilated.   5. Moderate pericardial effusion. The pericardial effusion is  circumferential. There is no evidence of cardiac tamponade.   6. The mitral valve is degenerative. Severe mitral valve regurgitation.  The mean mitral valve gradient is 7.1 mmHg with average heart rate of 110  bpm. Moderate  to severe mitral annular calcification.   7. The tricuspid valve is abnormal. Tricuspid valve regurgitation is  severe.   8. The aortic valve is tricuspid. There is mild calcification of the  aortic valve. There is mild thickening of the aortic valve. Aortic valve  regurgitation is mild to moderate. Mild to moderate aortic valve stenosis.   9. The inferior vena cava is dilated in size with <50% respiratory  variability, suggesting right atrial pressure of 15 mmHg.   Comparison(s): No significant change from prior study. Prior images  reviewed side by side.   2D echo 10/25/2022 IMPRESSIONS    1. Left ventricular ejection fraction, by estimation, is 40 to 45%. Left  ventricular ejection fraction by 3D volume is 43 %. The left ventricle has  mildly decreased function. The left ventricle demonstrates global  hypokinesis. The left ventricular  internal cavity size was mildly dilated. There is severe concentric left  ventricular hypertrophy. Left ventricular diastolic parameters are  consistent with Grade II diastolic dysfunction (pseudonormalization).  Elevated left atrial pressure.   2. Right ventricular systolic function is normal. The right ventricular  size is moderately enlarged. There is severely elevated pulmonary artery  systolic pressure. The estimated right ventricular systolic pressure is  35.7 mmHg.   3. Left atrial size was mildly dilated.   4. Right atrial size was mildly dilated.   5. Moderate pericardial effusion. The pericardial effusion is  circumferential. There is no evidence of cardiac tamponade.   6. The mitral valve is degenerative. Severe mitral valve regurgitation.  The mean mitral valve gradient is 7.1 mmHg with average heart rate of 110  bpm. Moderate to severe mitral annular calcification.   7. The tricuspid valve is abnormal. Tricuspid valve regurgitation is  severe.  8. The aortic valve is tricuspid. There is mild calcification of the  aortic valve.  There is mild thickening of the aortic valve. Aortic valve  regurgitation is mild to moderate. Mild to moderate aortic valve stenosis.   9. The inferior vena cava is dilated in size with <50% respiratory  variability, suggesting right atrial pressure of 15 mmHg.   Comparison(s): No significant change from prior study. Prior images  reviewed side by side.   Patient Profile     26 y.o. female with a hx of end-stage renal disease on dialysis, hypertension, borderline personality disorder, PTSD and CHF followed by Dr. Tamala Julian with cardiology who is being seen today for the evaluation of chest pain at the request of Tennis Must, MD.   [place]    Chest pain/pericardial effusion -Chest pain is atypical and described as tightness and sharp much worse with inspiration and consistent with pericardial effusion noted on 2D echo and consistent with uremic pericarditis with pericardial effusion.  There is no evidence of tamponade -EKG is nonischemic and troponin is minimally elevated with flat trend and not consistent with ACS -has been getting daily  HD with UF by renalbut continues to complain of severe SOB and CP>>repeat echo yesterday with large pericardial effusion with respiratory variation in MV inflow and dilated IVC but no RV diastolic collapse or RA inversion,  Effusion has not improved with HD.  -will ask CVTS to consult in regards to pericardial window>>She also has significant MV disease and unsure whether we should proceed with pericardial window and then workup her MV disease or get right and left heart cath before undergoing pericardial window since she remains hemodynamically stable   2. 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 This was medically managed. Repeat echo 9/26 with stable LVEF 45-50%. Severe MR noted.  -She has severe LVH on 2D echo so suspect her cardiomyopathy is related to  uncontrolled hypertension medical noncompliance -Now readmitted with acute on chronic HFmrEF secondary to missed dialysis sessions and medical noncompliance with heart failure medicines -She was placed on carvedilol, Imdur and hydralazine at last hospitalization in September 2023 -She admits to missing doses of medications and blood pressure was markedly elevated on admission as high as 161/119 mmHg -Volume management per nephrology.   -continue Torsemide 100mg  daily, Hydralazine 50mg  TID, Carvedilol 3.125mg  BID and IMdur 60mg  daily -No ACE/ARB/ARNI/MRA due to ESRD on HD -Carvedilol on hold due to acute CHF decompensation   3.  Hypertensive urgency -Patient has evidence of endorgan damage with severe LVH on echo -Suspect patient again has been noncompliant in taking her medication and also has missed dialysis sessions -BP should improve with dialysis sessions -continue Carvedilol, Hydralazine and IMdur   4.  Mitral regurgitation/Mitral Stenosis -This has been severe on several recent echoes. -She has severe mitral annular calcification related to her end-stage renal disease with moderate mitral stenosis with mean mitral valve gradient of 7 mmHg although this is in the setting of heart rate of 110 bpm and severe MR -Unclear how much this is contributing to her SOB but suspect her main issue is the large pericardia effusion -I think she is going to need a pericardial window since her pericardial effusion is not getting better.  I also am concerned about the mitral valve which also is likely contributing to SOB.  She likely should have a right and left heart cath to determine if hemodynamics of her MV and  determine if MVR may be needed   5.  Aortic stenosis -2D echo this admission showed mild to moderate aortic stenosis and mild to moderate AI -Follow with yearly echo    I have spent a total of 35 echoes minutes with patient reviewing 2D echo , telemetry, EKGs, labs and examining patient as  well as establishing an assessment and plan that was discussed with the patient.  > 50% of time was spent in direct patient care.     For questions or updates, please contact [place] Please consult [email or web address] for contact info under         Signed,  Fransico Him, MD  11/06/2022, 10:04 PM

## 2022-11-06 NOTE — Consult Note (Cosign Needed Addendum)
[address]       [place] [address]             [phone]       PCP is [name], MD Referring Provider is Dr. Fransico Him, MD Cardiologist: Dr. Daneen Schick, MD  Chief Complaint  Patient presents with   Chest Pain  Reason for consult: Moderate to large pericardial effusion  History of Presenting Illness: This is a 26 year old female with a past medical history of non compliance, primary hypertension, anemia of chronic disease, ESRD on HD, obesity, moderated mitral valve regurgitation, moderate to severe, tricuspid regurgitation, acute on chronic combined systolic and diastolic heart failure, focal glomerulosclerosis, secondary hyperparathyroidism, polysubstance abuse (cannabis, cocaine, nicotine), septic arthritis of multiple joints, severe recurrent major depression without psychotic features, who initially presented to [facility] ED on 11/01/2022 with complaints of chest pain. She was transferred to [facility] for further evaluation and treatment.  Nephrology and cardiology were consulted. CXR done showed moderately large heart silhouette, central vascular prominence without overt edema, and moderately large right and small left pleural effusions. EKG apparently did not show evidence of ischemia. Troponin I (high sensitivity) initially was 365 but with flat trend, was not felt to be ACS. Echo done 11/04/2022 showed LVEF 55-60%, moderate MR/MS, moderate to severe TR, moderate AS and trivial AI, and a moderate to large circumferential pericardial effusion without RV diastolic collapse or RA inversion. Of note, patient had a 2D echo in November that showed LVEF 45-50%, severe MR and TR,  mild to moderate AI, and a moderate pericardial effusion. She was apparently suppose to have a repeat echo after HD, but this does appear to have been done. Dr. Tenny Craw has been consulted for consideration of a pericardial window. Patient receiving blood and HD at the time of my  exam. She has cuffs on both ankles and correction officer at bedside (incarcerate prior to admission). She is tachycardic, hypertensive, and on 4 liters of oxygen.  Past Medical History: Past Medical History:  Diagnosis Date   Asthma    as a child, no problem as an adult, no inhaler   CHF (congestive heart failure) (HCC)    Complication of anesthesia    woke up before tube removed, 1 time fought nurses   ESRD on hemodialysis [facility])    M-W-F   History of borderline personality disorder    Hypertension    diagnosed as child; stopped meds at 24 yo   Insomnia    Neuromuscular disorder ([place])    peripheral neuropathy   Nonspecific chest pain    PTSD (post-traumatic stress disorder)     Past Surgical History: Past Surgical History:  Procedure Laterality Date   AV FISTULA PLACEMENT Left 10/18/2020   Procedure: LEFT ARM ARTERIOVENOUS (AV) FISTULA CREATION;  Surgeon: Serafina Mitchell, MD;  Location: [facility] OR;  Service: Vascular;  Laterality: Left;   CHOLECYSTECTOMY     extraction of wisdom teeth     FISTULA SUPERFICIALIZATION Left 02/13/2021   Procedure: LEFT BRACHIOCEPHALIC ARTERIOVENOUS FISTULA SUPERFICIALIZATION;  Surgeon: Serafina Mitchell, MD;  Location: [place];  Service: Vascular;  Laterality: Left;   I & D EXTREMITY Left 01/30/2022   Procedure: IRRIGATION AND DEBRIDEMENT EXTREMITY;  Surgeon: Milly Jakob, MD;  Location: [facility];  Service: Orthopedics;  Laterality: Left;   INCISION AND DRAINAGE OF WOUND Left 01/30/2022   Procedure: LEFT WRIST ASPIRATION;  Surgeon: Vanetta Mulders, MD;  Location: [facility];  Service: Orthopedics;  Laterality: Left;   KNEE ARTHROSCOPY Right 01/30/2022   Procedure: ARTHROSCOPY KNEE AND IRRIIGATION AND DEBRIDMENT; LEFT WRIST ASPIRATION;  Surgeon: Vanetta Mulders, MD;  Location: [place];  Service: Orthopedics;  Laterality: Right;   RENAL BIOPSY     x 2   TUNNELED VENOUS CATHETER PLACEMENT  02/11/2021   [facility]    Family History: Family History  Adopted:  Yes  Problem Relation Age of Onset   Diabetes Other    Hypertension Other     Social History Social History   Tobacco Use   Smoking status: Every Day    Packs/day: 0.50    Years: 10.00    Total pack years: 5.00    Types: Cigarettes   Smokeless tobacco: Never  Vaping Use   Vaping Use: Never used  Substance Use Topics   Alcohol use: Not Currently    Comment: "maybe 3 a month."- liquor   Drug use: Yes    Types: Marijuana, Cocaine    Comment: reports daily use of both    Current Facility-Administered Medications  Medication Dose Route Frequency Provider Last Rate Last Admin   0.9 %  sodium chloride infusion (Manually program via Guardrails IV Fluids)   Intravenous Once Shawna Clamp, MD       acetaminophen (TYLENOL) tablet 650 mg  650 mg Oral Q6H PRN Reubin Milan, MD   650 mg at 11/04/22 1600   Or   acetaminophen (TYLENOL) suppository 650 mg  650 mg Rectal Q6H PRN Reubin Milan, MD       ARIPiprazole (ABILIFY) tablet 7.5 mg  7.5 mg Oral Daily Reubin Milan, MD   7.5 mg at 11/05/22 1007   calcitRIOL (ROCALTROL) capsule 0.25 mcg  0.25 mcg Oral Q M,W,F-HD Reubin Milan, MD   0.25 mcg at 11/04/22 1224   calcium acetate (PHOSLO) capsule 667 mg  667 mg Oral TID WC Reubin Milan, MD   667 mg at 11/06/22 1237   carvedilol (COREG) tablet 3.125 mg  3.125 mg Oral BID WC Loren Racer, PA-C       Chlorhexidine Gluconate Cloth 2 % PADS 6 each  6 each Topical Q0600 Justin Mend, MD       Darbepoetin Alfa (ARANESP) injection 100 mcg  100 mcg Subcutaneous Q Mon-1800 Justin Mend, MD   100 mcg at 11/02/22 1707   dicyclomine (BENTYL) tablet 20 mg  20 mg Oral Q6H PRN Reubin Milan, MD   20 mg at 11/04/22 1646   ferric gluconate (FERRLECIT) 125 mg in sodium chloride 0.9 % 100 mL IVPB  125 mg Intravenous Q M,W,F-HD Justin Mend, MD   Stopped at 11/04/22 1910   guaiFENesin (ROBITUSSIN) 100 MG/5ML liquid 10 mL  10 mL Oral Q6H PRN Howerter, Justin  B, DO   10 mL at 11/06/22 1555   heparin injection 1,800 Units  20 Units/kg Dialysis Once in dialysis Stovall, Kathryn R, PA-C       hydrALAZINE (APRESOLINE) injection 10 mg  10 mg Intravenous Q6H PRN Shawna Clamp, MD       hydrALAZINE (APRESOLINE) tablet 50 mg  50 mg Oral Q8H Shawna Clamp, MD       hydrOXYzine (ATARAX) tablet 25 mg  25 mg Oral Q8H PRN Reubin Milan, MD   25 mg at 11/05/22 1748   isosorbide mononitrate (IMDUR) 24 hr tablet 60 mg  60 mg Oral Daily Loren Racer, PA-C  methocarbamol (ROBAXIN) tablet 500 mg  500 mg Oral Q8H PRN Reubin Milan, MD   500 mg at 11/05/22 1750   mupirocin ointment (BACTROBAN) 2 % 1 Application  1 Application Nasal BID Shawna Clamp, MD   1 Application at 55/73/22 2111   ondansetron (ZOFRAN) tablet 4 mg  4 mg Oral Q6H PRN Reubin Milan, MD       Or   ondansetron [facility]) injection 4 mg  4 mg Intravenous Q6H PRN Reubin Milan, MD       pentafluoroprop-tetrafluoroeth [facility]) aerosol            sevelamer carbonate (RENVELA) tablet 1,600 mg  1,600 mg Oral TID WC Reubin Milan, MD   1,600 mg at 11/06/22 1237   torsemide (DEMADEX) tablet 100 mg  100 mg Oral q morning Reubin Milan, MD   100 mg at 11/05/22 1007   traZODone (DESYREL) tablet 100 mg  100 mg Oral QHS Reubin Milan, MD   100 mg at 11/05/22 2111    Allergies: Allergies  Allergen Reactions   Bupropion Anxiety, Other (See Comments) and Nausea And Vomiting    Caused panic attacks  Caused panic attacks    "Adverse effects"   Prozac [Fluoxetine Hcl] Anxiety and Other (See Comments)    Caused panic attacks   Prednisone Other (See Comments)    Pt stated this med caused pancreatitis    Review of Systems: Positive for chest pain, shortness of breath, cough, fatigue   BP (!) 163/117 (BP Location: Right Arm)   Pulse (!) 113   Temp 98.2 F (36.8 C) (Oral)   Resp (!) 27   Ht 5\' 8"  (1.727 m)   Wt 96.3 kg   LMP  (LMP Unknown) Comment:  Patient notes she is not having periods (10/04/22)  SpO2 99%   BMI 32.28 kg/m   Physical Exam: General appearance: Sleeping while on HD. Awakened, cooperative. She has cuffs on both ankles and correction officer at bedside. Neurologic: Grossly intact without focal deficit Heart: Tachycardic, grade III/VI murmur Lungs: Coarse breath sounds bilaterally Abdomen: Soft, obese, bowel sounds present Extremities: Mild LE edema  Diagnostic Studies and Lab Results: Results for orders placed or performed during the hospital encounter of 11/01/22 (from the past 48 hour(s))  MRSA Next Gen by PCR, Nasal     Status: Abnormal   Collection Time: 11/05/22  5:37 PM   Specimen: Nasal Mucosa; Nasal Swab  Result Value Ref Range   MRSA by PCR Next Gen DETECTED (A) NOT DETECTED    Comment: RESULT CALLED TO, READ BACK BY AND VERIFIED WITH: RN GABBY BAUTISTA ON 11/05/22 @ 2007 BY DRT (NOTE) The GeneXpert MRSA Assay (FDA approved for NASAL specimens only), is one component of a comprehensive MRSA colonization surveillance program. It is not intended to diagnose MRSA infection nor to guide or monitor treatment for MRSA infections. Test performance is not FDA approved in patients less than 91 years old. Performed at [facility], [place] [address] [place], [place] [address]   Hemoglobin and hematocrit, blood     Status: Abnormal   Collection Time: 11/06/22  5:42 AM  Result Value Ref Range   Hemoglobin 7.1 (L) 12.0 - 15.0 g/dL   HCT 23.6 (L) 36.0 - 46.0 %    Comment: Performed at [facility], [place] [address] [place], [place] [address]  Type and screen [place]     Status: None (Preliminary result)  Collection Time: 11/06/22  2:57 PM  Result Value Ref Range   ABO/RH(D) A NEG    Antibody Screen NEG    Sample Expiration      11/09/2022,2359 Performed at [facility], [place] [address] [place], [place] [address]    Unit Number [id number]    Blood Component Type RED  CELLS,LR    Unit division 00    Status of Unit ALLOCATED    Transfusion Status OK TO TRANSFUSE    Crossmatch Result Compatible   Prepare RBC (crossmatch)     Status: None   Collection Time: 11/06/22  2:57 PM  Result Value Ref Range   Order Confirmation      ORDER PROCESSED BY BLOOD BANK Performed at [facility], [place] [address], [place], [place] [address]    ECHOCARDIOGRAM LIMITED  Result Date: 11/05/2022    ECHOCARDIOGRAM LIMITED REPORT   Patient Name:   Natasha Long Date of Exam: 11/05/2022 Medical Rec #:  [id number]         Height:       68.0 in Accession #:    [id number]        Weight:       197.1 lb Date of Birth:  07-07-1996         BSA:          2.031 m Patient Age:    26 years          BP:           133/71 mmHg Patient Gender: F                 HR:           88 bpm. Exam Location:  Inpatient Procedure: Limited Echo and Cardiac Doppler Indications:    Pericardial effusion I31.3  History:        Patient has prior history of Echocardiogram examinations, most                 recent 11/04/2022. CHF, Pericardial Disease, Mitral Valve                 Disease, Signs/Symptoms:Chest Pain; Risk Factors:Hypertension                 and Current Smoker.  Sonographer:    Greer Pickerel Referring Phys: [place] Comments: Image acquisition challenging due to patient body habitus and Image acquisition challenging due to respiratory motion. IMPRESSIONS  1. Large pericardial effusion in the posterior portion of the left ventricle with small-moderate pericardial effusion in the anterior/RV side. There is no RV or RA collapse. IVC not well visualized. There is excessive respiratory variation in the mitral  valve spectral Doppler velocities.  2. Left ventricular ejection fraction, by estimation, is 55 to 60%. The left ventricle has normal function.  3. The mitral valve is normal in structure. not assessed mitral valve regurgitation. not assesed mitral stenosis.  4. Tricuspid valve  regurgitation not assessed. not assessed tricuspid stenosis.  5. Aortic valve regurgitation not assessed. not assessed. Comparison(s): No significant change from prior study. FINDINGS  Left Ventricle: Left ventricular ejection fraction, by estimation, is 55 to 60%. The left ventricle has normal function. Pericardium: Large pericardial effusion in the posterior portion of the left ventricle with small-moderate pericardial effusion in the anterior/RV side. There is no RV or RA collapse. IVC not well visualized. There is excessive respiratory variation in the mitral valve spectral Doppler velocities. There is  excessive respiratory variation in the mitral valve spectral Doppler velocities. Mitral Valve: The mitral valve is normal in structure. Not assessed mitral valve regurgitation. Not assesed mitral valve stenosis. Tricuspid Valve: Tricuspid valve regurgitation not assessed. not assessed tricuspid stenosis. Aortic Valve: Aortic valve regurgitation not assessed. Not assessed. Additional Comments: Spectral Doppler performed.  Berniece Salines DO Electronically signed by Berniece Salines DO Signature Date/Time: 11/05/2022/5:39:34 PM    Final    ECHOCARDIOGRAM COMPLETE  Result Date: 11/04/2022    ECHOCARDIOGRAM REPORT   Patient Name:   Natasha Long Date of Exam: 11/04/2022 Medical Rec #:  [id number]         Height:       68.0 in Accession #:    [id number]        Weight:       205.9 lb Date of Birth:  Oct 29, 1996         BSA:          2.069 m Patient Age:    26 years          BP:           129/81 mmHg Patient Gender: F                 HR:           91 bpm. Exam Location:  Inpatient Procedure: 2D Echo, Cardiac Doppler and Color Doppler Indications:    Pericardial effusion  History:        Patient has prior history of Echocardiogram examinations, most                 recent 10/25/2022. CHF, Signs/Symptoms:Chest Pain and Shortness                 of Breath; Risk Factors:Hypertension and Current Smoker. ESRD on                  Dialysis, Polysubstance abuse.  Sonographer:    Wenda Low Referring Phys: [place]  1. Left ventricular ejection fraction, by estimation, is 55 to 60%. The left ventricle has normal function. The left ventricle has no regional wall motion abnormalities. There is severe concentric left ventricular hypertrophy. Left ventricular diastolic  parameters are indeterminate. Elevated left ventricular end-diastolic pressure.  2. Right ventricular systolic function is normal. The right ventricular size is mildly enlarged. There is moderately elevated pulmonary artery systolic pressure. The estimated right ventricular systolic pressure is 82.9 mmHg.  3. The mitral valve is degenerative. Moderate mitral valve regurgitation. Moderate mitral stenosis. The mean mitral valve gradient is 6.0 mmHg. Moderate mitral annular calcification.  4. Tricuspid valve regurgitation is moderate to severe.  5. The aortic valve is tricuspid. Aortic valve regurgitation is trivial. Moderate aortic valve stenosis. Aortic valve area, by VTI measures 1.37 cm. Aortic valve mean gradient measures 24.0 mmHg. Aortic valve Vmax measures 3.42 m/s.  6. The inferior vena cava is dilated in size with <50% respiratory variability, suggesting right atrial pressure of 15 mmHg.  7. Moderate to large circumferential pericardial effusion with greatest diameter posterior to the LV measuring 3.02cm. There is no RV diastolic collapse or RA inversion. There is excessive respiratory variation in the mitral valve spectral Doppler velocities. The IVC is dilated and plethoric.  8. Compared to prior study dated 10/25/2022, the pericardial effusion has increased in size and now there is excessive respiratory variation in MV doppler velocities but no RV diastolic colllpase or RA inversion. Clinical correlation recommended. FINDINGS  Left  Ventricle: Left ventricular ejection fraction, by estimation, is 55 to 60%. The left ventricle has normal  function. The left ventricle has no regional wall motion abnormalities. The left ventricular internal cavity size was normal in size. There is  severe concentric left ventricular hypertrophy. Left ventricular diastolic parameters are indeterminate. Elevated left ventricular end-diastolic pressure. Right Ventricle: The right ventricular size is mildly enlarged. No increase in right ventricular wall thickness. Right ventricular systolic function is normal. There is moderately elevated pulmonary artery systolic pressure. The tricuspid regurgitant velocity is 2.99 m/s, and with an assumed right atrial pressure of 15 mmHg, the estimated right ventricular systolic pressure is 39.0 mmHg. Left Atrium: Left atrial size was normal in size. Right Atrium: Right atrial size was normal in size. Pericardium: Moderate to large circumferential pericardial effusion with greatest diameter posterior to the LV measuring 3.02cm. There is no RV diastolic collapse or RA inversion. A moderately sized pericardial effusion is present. The pericardial effusion is circumferential. There is excessive respiratory variation in the mitral valve spectral Doppler velocities. Mitral Valve: The mitral valve is degenerative in appearance. There is moderate thickening of the mitral valve leaflet(s). Moderate mitral annular calcification. Moderate mitral valve regurgitation. Moderate mitral valve stenosis. MV peak gradient, 11.0 mmHg. The mean mitral valve gradient is 6.0 mmHg. Tricuspid Valve: The tricuspid valve is normal in structure. Tricuspid valve regurgitation is moderate to severe. No evidence of tricuspid stenosis. Aortic Valve: The aortic valve is tricuspid. Aortic valve regurgitation is trivial. Moderate aortic stenosis is present. Aortic valve mean gradient measures 24.0 mmHg. Aortic valve peak gradient measures 46.9 mmHg. Aortic valve area, by VTI measures 1.37  cm. Pulmonic Valve: The pulmonic valve was normal in structure. Pulmonic valve  regurgitation is not visualized. No evidence of pulmonic stenosis. Aorta: The aortic root is normal in size and structure. Venous: The inferior vena cava is dilated in size with less than 50% respiratory variability, suggesting right atrial pressure of 15 mmHg. IAS/Shunts: No atrial level shunt detected by color flow Doppler.  LEFT VENTRICLE PLAX 2D LVIDd:         4.80 cm   Diastology LVIDs:         3.20 cm   LV e' medial:    8.70 cm/s LV PW:         1.60 cm   LV E/e' medial:  18.2 LV IVS:        1.60 cm   LV e' lateral:   6.64 cm/s LVOT diam:     2.00 cm   LV E/e' lateral: 23.8 LV SV:         81 LV SV Index:   39 LVOT Area:     3.14 cm  RIGHT VENTRICLE RV Basal diam:  3.90 cm RV Mid diam:    3.10 cm RV S prime:     10.90 cm/s TAPSE (M-mode): 2.2 cm LEFT ATRIUM             Index        RIGHT ATRIUM           Index LA diam:        4.20 cm 2.03 cm/m   RA Area:     18.60 cm LA Vol (A2C):   88.1 ml 42.58 ml/m  RA Volume:   54.30 ml  26.24 ml/m LA Vol (A4C):   51.3 ml 24.79 ml/m LA Biplane Vol: 67.3 ml 32.52 ml/m  AORTIC VALVE  PULMONIC VALVE AV Area (Vmax):    1.29 cm      PV Vmax:       1.49 m/s AV Area (Vmean):   1.35 cm      PV Peak grad:  8.9 mmHg AV Area (VTI):     1.37 cm AV Vmax:           342.25 cm/s AV Vmean:          223.750 cm/s AV VTI:            0.593 m AV Peak Grad:      46.9 mmHg AV Mean Grad:      24.0 mmHg LVOT Vmax:         140.00 cm/s LVOT Vmean:        96.200 cm/s LVOT VTI:          0.259 m LVOT/AV VTI ratio: 0.44  AORTA Ao Root diam: 3.20 cm Ao Asc diam:  3.60 cm MITRAL VALVE                  TRICUSPID VALVE MV Area (PHT): 2.86 cm       TR Peak grad:   35.8 mmHg MV Area VTI:   1.85 cm       TR Vmax:        299.00 cm/s MV Peak grad:  11.0 mmHg MV Mean grad:  6.0 mmHg       SHUNTS MV Vmax:       1.66 m/s       Systemic VTI:  0.26 m MV Vmean:      116.0 cm/s     Systemic Diam: 2.00 cm MV Decel Time: 265 msec MR Peak grad:    129.5 mmHg MR Mean grad:    84.0 mmHg MR Vmax:          569.00 cm/s MR Vmean:        436.0 cm/s MR PISA:         1.01 cm MR PISA Eff ROA: 16 mm MR PISA Radius:  0.40 cm MV E velocity: 158.00 cm/s MV A velocity: 112.00 cm/s MV E/A ratio:  1.41 Fransico Him MD Electronically signed by Fransico Him MD Signature Date/Time: 11/04/2022/10:00:13 PM    Final     Impression and Plan: History of a pericardial effusion (moderate on echo in November 2023). Repeat echo moderate to large pericardial effusion without evidence of tamponade. Dr. Tenny Craw to evaluate for pericardial window. Likely to be done 11/07/2022. History of moderate MR/MS, moderate to severe TR, and moderate AS 3. History of ESRD-on HD 4. History of acute combined systolic and diastolic heart failure-on Demadex 100 mg daily 5. History of hypertension-on Coreg 3.125 mg bid, Hydralazine 50 mg tid, Imdur 60 mg daily 6. History of severe recurrent major depression without psychotic features-on Abilify  7. History of anemia of chronic disease-on Aranesp and Ferric Gluconate. Receiving blood on HD  Natasha Puerta PA-C 11/06/2022,4:41 PM   DR Tenny Craw ADDENDUM  Patient seen and echo reviewed  It's not clear to me that patient needs a pericardial window  She feels better with fluid off in dialysis - this is not typical of compressive symptoms  On exam, she is hypertensive  By echo, no clear signs of right sided compression  Not a candidate for more than a pericardial window given overall condition.  I have reserved a time for pericardial on Monday afternoon in case we decide to proceed.   Will further discuss with  cardiology.   Justice Rocher MD CV Surgery

## 2022-11-06 NOTE — Progress Notes (Signed)
PROGRESS NOTE    Natasha Long  OFB:[id number] DOB: [birth year] DOA: 11/01/2022 PCP: [name], MD   Brief Narrative:  This 26 yrs old female with PMH significant of asthma, hypertension, chronic combined systolic heart failure,, chronic non-specific chest pain, severe mitral regurgitation, pericardial effusion without cardiac tamponade, pleural effusion, focal glomerulosclerosis, ESRD on hemodialysis, secondary hyperparathyroidism, iron deficiency / ESRD anemia, class I obesity, borderline personality disorder, recurrent depression without psychotic features, polysubstance abuse (cannabis, cocaine, nicotine,  occasional alcohol use), PTSD, homelessness, insomnia, peripheral neuropathy, syphilis, septic arthritis of multiple joints who was recently admitted for acute respiratory failure in the setting of volume overload causing pulmonary edema, rhinovirus infection, hypertensive urgency and discharged to the local jail.  Patient again presents at this time with substernal chest pain for 2 days.  Not a good historian.  In the ED, patient had stable vitals including blood pressure.  Labs showed hemoglobin of 8.5 with elevated troponin at 365 and then 366 ng/L.  BNP was 1485.8 pg/mL.  BMP showed  BUN of 49 and creatinine of 8.93 mg/dL.  Chest x-ray showed moderate large heart silhouette with central vascular prominence without overt edema.  Moderate to large right and small left pleural effusions with overlying atelectasis or consolidations similar in appearance to 10/25/2022. Patient was then admitted hospital for further evaluation and treatment.   Assessment & Plan:   Principal Problem:   Volume overload Active Problems:   Primary hypertension   Elevated troponin   Pericardial effusion   Anemia in chronic kidney disease   Severe recurrent major depression without psychotic features (HCC)   Borderline personality disorder (HCC)   ESRD (end stage renal disease) (HCC)   Moderate  protein-calorie malnutrition (HCC)   Acute combined systolic (congestive) and diastolic (congestive) heart failure (HCC)   Mitral valve insufficiency   ESRD on hemodialysis (HCC)   Chest pain   Volume overload: ESRD on hemodialysis: Acute on chronic combined systolic and diastolic heart failure Patient presented with severe shortness of breath. Continue telemetry monitoring, oxygen supplementation, continue torsemide 100 mg daily.   Continue intake and output charting, daily weights.  No ACE inhibitor's or beta-blocker.   Cardiology and Nephrology has been consulted.  Patient has undergone hemodialysis at this time.   2D echo June 2023 with LV ejection fraction of 45 to 50%.  Coumadin on hold to CHF.   Plans to repeat 2D echocardiogram when volume status is euvolemic.   Counseled her regarding fluid restriction and compliance with dialysis. Volume management as per HD and diuresis   Chronic chest pain, elevated troponin: EKG nonischemic.  Troponin elevated but not uptrending. Cardiology recommends outpatient follow-up with stress test and aggressive hemodialysis.   Essential hypertension: Continue Cardizem and torsemide.   Cardiology recommends Imdur 30 and hydralazine 25 mg 3 times daily as tolerated.   Start Hydralazine 10 mg Iv q6hr as needed for SBP above 170 for better BP control.   Pericardial effusion without tamponade: Chest pain could be uremic pericarditis. Controlled. Echo shows pericardial effusion, seems consistent with uremic pericarditis. She may eventually require pericardial window if it does not get better with hemodialysis.  ESRD on hemodialysis: Nephrology on board.   Continue dialysis as per nephro . counseled her regarding need for full attendance with dialysis.   Anemia of chronic kidney disease: Erythropoietin as per nephrology. Hb 7.1, monitor H/H   Major depression recurrent. Borderline personality disorder Continue Abilify, hydroxyzine,  trazodone.   Moderate protein-calorie malnutrition ([place]) Nutrition has been consulted.  DVT prophylaxis: SCDs Code Status: Full code Family Communication: No family at bedside Disposition Plan:   Status is: Inpatient Remains inpatient appropriate because: To [facility] likely in 1-2 days.   Consultants:  Cardiology Nephrology  Procedures:  Hemodialysis Antimicrobials:  None  Subjective: Patient was seen and examined at bedside.  Overnight events noted. Patient continued to complain about shortness of breath. She was sitting comfortably. She is going to have hemodialysis today.  Patient has refused echocardiogram in the morning.  Objective: Vitals:   11/05/22 0851 11/05/22 2339 11/06/22 0631 11/06/22 0923  BP: 133/71 139/87 (!) 142/86 (!) 151/108  Pulse: 86 (!) 109 89 (!) 103  Resp: 18 20 18    Temp:  98 F (36.7 C)  98.6 F (37 C)  TempSrc: Oral Oral  Oral  SpO2: 100% 100% 96% 99%  Weight:      Height:        Intake/Output Summary (Last 24 hours) at 11/06/2022 1343 Last data filed at 11/06/2022 1000 Gross per 24 hour  Intake 357.83 ml  Output 0 ml  Net 357.83 ml   Filed Weights   11/03/22 0125 11/04/22 1530 11/04/22 2015  Weight: 87.6 kg 93.4 kg 89.4 kg    Examination:  General exam: Appears comfortable, not in any acute distress.  Deconditioned Respiratory system: Decreased breath sounds, no accessory muscle use, respiratory effort normal, RR 13 Cardiovascular system: S1-S2 heard, regular rate and rhythm, no murmur. Gastrointestinal system: Abdomen is soft, non tender, non distended, BS+ Central nervous system: Alert and oriented x 3, no focal neurological deficits. Extremities: Edema+, no cyanosis, no clubbing. Skin: No rashes, lesions or ulcers Psychiatry: Judgement and insight appear normal. Mood & affect appropriate.     Data Reviewed: I have personally reviewed following labs and imaging studies  CBC: Recent Labs  Lab 11/02/22 0239  11/06/22 0542  WBC 9.7  --   HGB 8.5* 7.1*  HCT 28.1* 23.6*  MCV 100.4*  --   PLT 300  --    Basic Metabolic Panel: Recent Labs  Lab 11/02/22 0239  NA 139  K 4.7  CL 100  CO2 27  GLUCOSE 89  BUN 49*  CREATININE 8.93*  CALCIUM 9.0   GFR: Estimated Creatinine Clearance: 11 mL/min (A) (by C-G formula based on SCr of 8.93 mg/dL (H)). Liver Function Tests: No results for input(s): "AST", "ALT", "ALKPHOS", "BILITOT", "PROT", "ALBUMIN" in the last 168 hours. No results for input(s): "LIPASE", "AMYLASE" in the last 168 hours. No results for input(s): "AMMONIA" in the last 168 hours. Coagulation Profile: No results for input(s): "INR", "PROTIME" in the last 168 hours. Cardiac Enzymes: No results for input(s): "CKTOTAL", "CKMB", "CKMBINDEX", "TROPONINI" in the last 168 hours. BNP (last 3 results) No results for input(s): "PROBNP" in the last 8760 hours. HbA1C: No results for input(s): "HGBA1C" in the last 72 hours. CBG: No results for input(s): "GLUCAP" in the last 168 hours. Lipid Profile: No results for input(s): "CHOL", "HDL", "LDLCALC", "TRIG", "CHOLHDL", "LDLDIRECT" in the last 72 hours. Thyroid Function Tests: No results for input(s): "TSH", "T4TOTAL", "FREET4", "T3FREE", "THYROIDAB" in the last 72 hours. Anemia Panel: No results for input(s): "VITAMINB12", "FOLATE", "FERRITIN", "TIBC", "IRON", "RETICCTPCT" in the last 72 hours. Sepsis Labs: No results for input(s): "PROCALCITON", "LATICACIDVEN" in the last 168 hours.  Recent Results (from the past 240 hour(s))  Resp Panel by RT-PCR (Flu A&B, Covid) Anterior Nasal Swab     Status: None   Collection Time: 11/02/22  4:33 AM  Specimen: Anterior Nasal Swab  Result Value Ref Range Status   SARS Coronavirus 2 by RT PCR NEGATIVE NEGATIVE Final    Comment: (NOTE) SARS-CoV-2 target nucleic acids are NOT DETECTED.  The SARS-CoV-2 RNA is generally detectable in upper respiratory specimens during the acute phase of infection.  The lowest concentration of SARS-CoV-2 viral copies this assay can detect is 138 copies/mL. A negative result does not preclude SARS-Cov-2 infection and should not be used as the sole basis for treatment or other patient management decisions. A negative result may occur with  improper specimen collection/handling, submission of specimen other than nasopharyngeal swab, presence of viral mutation(s) within the areas targeted by this assay, and inadequate number of viral copies(<138 copies/mL). A negative result must be combined with clinical observations, patient history, and epidemiological information. The expected result is Negative.  Fact Sheet for Patients:  EntrepreneurPulse.com.au  Fact Sheet for Healthcare Providers:  IncredibleEmployment.be  This test is no t yet approved or cleared by the [place] FDA and  has been authorized for detection and/or diagnosis of SARS-CoV-2 by FDA under an Emergency Use Authorization (EUA). This EUA will remain  in effect (meaning this test can be used) for the duration of the COVID-19 declaration under Section 564(b)(1) of the Act, 21 U.S.C.section 360bbb-3(b)(1), unless the authorization is terminated  or revoked sooner.       Influenza A by PCR NEGATIVE NEGATIVE Final   Influenza B by PCR NEGATIVE NEGATIVE Final    Comment: (NOTE) The Xpert Xpress SARS-CoV-2/FLU/RSV plus assay is intended as an aid in the diagnosis of influenza from Nasopharyngeal swab specimens and should not be used as a sole basis for treatment. Nasal washings and aspirates are unacceptable for Xpert Xpress SARS-CoV-2/FLU/RSV testing.  Fact Sheet for Patients: EntrepreneurPulse.com.au  Fact Sheet for Healthcare Providers: IncredibleEmployment.be  This test is not yet approved or cleared by the [place] FDA and has been authorized for detection and/or diagnosis of SARS-CoV-2 by FDA  under an Emergency Use Authorization (EUA). This EUA will remain in effect (meaning this test can be used) for the duration of the COVID-19 declaration under Section 564(b)(1) of the Act, 21 U.S.C. section 360bbb-3(b)(1), unless the authorization is terminated or revoked.  Performed at [facility], [place] [address], [place], [place] [address]   MRSA Next Gen by PCR, Nasal     Status: Abnormal   Collection Time: 11/05/22  5:37 PM   Specimen: Nasal Mucosa; Nasal Swab  Result Value Ref Range Status   MRSA by PCR Next Gen DETECTED (A) NOT DETECTED Final    Comment: RESULT CALLED TO, READ BACK BY AND VERIFIED WITH: RN GABBY BAUTISTA ON 11/05/22 @ 2007 BY DRT (NOTE) The GeneXpert MRSA Assay (FDA approved for NASAL specimens only), is one component of a comprehensive MRSA colonization surveillance program. It is not intended to diagnose MRSA infection nor to guide or monitor treatment for MRSA infections. Test performance is not FDA approved in patients less than 77 years old. Performed at [facility], [place] [address], [place], [place] [address]     Radiology Studies: ECHOCARDIOGRAM LIMITED  Result Date: 11/05/2022    ECHOCARDIOGRAM LIMITED REPORT   Patient Name:   Natasha Long Date of Exam: 11/05/2022 Medical Rec #:  [id number]         Height:       68.0 in Accession #:    [id number]        Weight:  197.1 lb Date of Birth:  12/19/1995         BSA:          2.031 m Patient Age:    26 years          BP:           133/71 mmHg Patient Gender: F                 HR:           88 bpm. Exam Location:  Inpatient Procedure: Limited Echo and Cardiac Doppler Indications:    Pericardial effusion I31.3  History:        Patient has prior history of Echocardiogram examinations, most                 recent 11/04/2022. CHF, Pericardial Disease, Mitral Valve                 Disease, Signs/Symptoms:Chest Pain; Risk Factors:Hypertension                 and Current Smoker.   Sonographer:    Greer Pickerel Referring Phys: [place] Comments: Image acquisition challenging due to patient body habitus and Image acquisition challenging due to respiratory motion. IMPRESSIONS  1. Large pericardial effusion in the posterior portion of the left ventricle with small-moderate pericardial effusion in the anterior/RV side. There is no RV or RA collapse. IVC not well visualized. There is excessive respiratory variation in the mitral  valve spectral Doppler velocities.  2. Left ventricular ejection fraction, by estimation, is 55 to 60%. The left ventricle has normal function.  3. The mitral valve is normal in structure. not assessed mitral valve regurgitation. not assesed mitral stenosis.  4. Tricuspid valve regurgitation not assessed. not assessed tricuspid stenosis.  5. Aortic valve regurgitation not assessed. not assessed. Comparison(s): No significant change from prior study. FINDINGS  Left Ventricle: Left ventricular ejection fraction, by estimation, is 55 to 60%. The left ventricle has normal function. Pericardium: Large pericardial effusion in the posterior portion of the left ventricle with small-moderate pericardial effusion in the anterior/RV side. There is no RV or RA collapse. IVC not well visualized. There is excessive respiratory variation in the mitral valve spectral Doppler velocities. There is excessive respiratory variation in the mitral valve spectral Doppler velocities. Mitral Valve: The mitral valve is normal in structure. Not assessed mitral valve regurgitation. Not assesed mitral valve stenosis. Tricuspid Valve: Tricuspid valve regurgitation not assessed. not assessed tricuspid stenosis. Aortic Valve: Aortic valve regurgitation not assessed. Not assessed. Additional Comments: Spectral Doppler performed.  Berniece Salines DO Electronically signed by Berniece Salines DO Signature Date/Time: 11/05/2022/5:39:34 PM    Final    ECHOCARDIOGRAM COMPLETE  Result Date:  11/04/2022    ECHOCARDIOGRAM REPORT   Patient Name:   Natasha Long Date of Exam: 11/04/2022 Medical Rec #:  [id number]         Height:       68.0 in Accession #:    [id number]        Weight:       205.9 lb Date of Birth:  22-Jun-1996         BSA:          2.069 m Patient Age:    26 years          BP:           129/81 mmHg Patient Gender: F  HR:           91 bpm. Exam Location:  Inpatient Procedure: 2D Echo, Cardiac Doppler and Color Doppler Indications:    Pericardial effusion  History:        Patient has prior history of Echocardiogram examinations, most                 recent 10/25/2022. CHF, Signs/Symptoms:Chest Pain and Shortness                 of Breath; Risk Factors:Hypertension and Current Smoker. ESRD on                 Dialysis, Polysubstance abuse.  Sonographer:    Wenda Low Referring Phys: [place]  1. Left ventricular ejection fraction, by estimation, is 55 to 60%. The left ventricle has normal function. The left ventricle has no regional wall motion abnormalities. There is severe concentric left ventricular hypertrophy. Left ventricular diastolic  parameters are indeterminate. Elevated left ventricular end-diastolic pressure.  2. Right ventricular systolic function is normal. The right ventricular size is mildly enlarged. There is moderately elevated pulmonary artery systolic pressure. The estimated right ventricular systolic pressure is 78.6 mmHg.  3. The mitral valve is degenerative. Moderate mitral valve regurgitation. Moderate mitral stenosis. The mean mitral valve gradient is 6.0 mmHg. Moderate mitral annular calcification.  4. Tricuspid valve regurgitation is moderate to severe.  5. The aortic valve is tricuspid. Aortic valve regurgitation is trivial. Moderate aortic valve stenosis. Aortic valve area, by VTI measures 1.37 cm. Aortic valve mean gradient measures 24.0 mmHg. Aortic valve Vmax measures 3.42 m/s.  6. The inferior vena cava is dilated in  size with <50% respiratory variability, suggesting right atrial pressure of 15 mmHg.  7. Moderate to large circumferential pericardial effusion with greatest diameter posterior to the LV measuring 3.02cm. There is no RV diastolic collapse or RA inversion. There is excessive respiratory variation in the mitral valve spectral Doppler velocities. The IVC is dilated and plethoric.  8. Compared to prior study dated 10/25/2022, the pericardial effusion has increased in size and now there is excessive respiratory variation in MV doppler velocities but no RV diastolic colllpase or RA inversion. Clinical correlation recommended. FINDINGS  Left Ventricle: Left ventricular ejection fraction, by estimation, is 55 to 60%. The left ventricle has normal function. The left ventricle has no regional wall motion abnormalities. The left ventricular internal cavity size was normal in size. There is  severe concentric left ventricular hypertrophy. Left ventricular diastolic parameters are indeterminate. Elevated left ventricular end-diastolic pressure. Right Ventricle: The right ventricular size is mildly enlarged. No increase in right ventricular wall thickness. Right ventricular systolic function is normal. There is moderately elevated pulmonary artery systolic pressure. The tricuspid regurgitant velocity is 2.99 m/s, and with an assumed right atrial pressure of 15 mmHg, the estimated right ventricular systolic pressure is 76.7 mmHg. Left Atrium: Left atrial size was normal in size. Right Atrium: Right atrial size was normal in size. Pericardium: Moderate to large circumferential pericardial effusion with greatest diameter posterior to the LV measuring 3.02cm. There is no RV diastolic collapse or RA inversion. A moderately sized pericardial effusion is present. The pericardial effusion is circumferential. There is excessive respiratory variation in the mitral valve spectral Doppler velocities. Mitral Valve: The mitral valve is  degenerative in appearance. There is moderate thickening of the mitral valve leaflet(s). Moderate mitral annular calcification. Moderate mitral valve regurgitation. Moderate mitral valve stenosis. MV peak gradient, 11.0 mmHg. The  mean mitral valve gradient is 6.0 mmHg. Tricuspid Valve: The tricuspid valve is normal in structure. Tricuspid valve regurgitation is moderate to severe. No evidence of tricuspid stenosis. Aortic Valve: The aortic valve is tricuspid. Aortic valve regurgitation is trivial. Moderate aortic stenosis is present. Aortic valve mean gradient measures 24.0 mmHg. Aortic valve peak gradient measures 46.9 mmHg. Aortic valve area, by VTI measures 1.37  cm. Pulmonic Valve: The pulmonic valve was normal in structure. Pulmonic valve regurgitation is not visualized. No evidence of pulmonic stenosis. Aorta: The aortic root is normal in size and structure. Venous: The inferior vena cava is dilated in size with less than 50% respiratory variability, suggesting right atrial pressure of 15 mmHg. IAS/Shunts: No atrial level shunt detected by color flow Doppler.  LEFT VENTRICLE PLAX 2D LVIDd:         4.80 cm   Diastology LVIDs:         3.20 cm   LV e' medial:    8.70 cm/s LV PW:         1.60 cm   LV E/e' medial:  18.2 LV IVS:        1.60 cm   LV e' lateral:   6.64 cm/s LVOT diam:     2.00 cm   LV E/e' lateral: 23.8 LV SV:         81 LV SV Index:   39 LVOT Area:     3.14 cm  RIGHT VENTRICLE RV Basal diam:  3.90 cm RV Mid diam:    3.10 cm RV S prime:     10.90 cm/s TAPSE (M-mode): 2.2 cm LEFT ATRIUM             Index        RIGHT ATRIUM           Index LA diam:        4.20 cm 2.03 cm/m   RA Area:     18.60 cm LA Vol (A2C):   88.1 ml 42.58 ml/m  RA Volume:   54.30 ml  26.24 ml/m LA Vol (A4C):   51.3 ml 24.79 ml/m LA Biplane Vol: 67.3 ml 32.52 ml/m  AORTIC VALVE                     PULMONIC VALVE AV Area (Vmax):    1.29 cm      PV Vmax:       1.49 m/s AV Area (Vmean):   1.35 cm      PV Peak grad:  8.9 mmHg  AV Area (VTI):     1.37 cm AV Vmax:           342.25 cm/s AV Vmean:          223.750 cm/s AV VTI:            0.593 m AV Peak Grad:      46.9 mmHg AV Mean Grad:      24.0 mmHg LVOT Vmax:         140.00 cm/s LVOT Vmean:        96.200 cm/s LVOT VTI:          0.259 m LVOT/AV VTI ratio: 0.44  AORTA Ao Root diam: 3.20 cm Ao Asc diam:  3.60 cm MITRAL VALVE                  TRICUSPID VALVE MV Area (PHT): 2.86 cm       TR Peak grad:   35.8  mmHg MV Area VTI:   1.85 cm       TR Vmax:        299.00 cm/s MV Peak grad:  11.0 mmHg MV Mean grad:  6.0 mmHg       SHUNTS MV Vmax:       1.66 m/s       Systemic VTI:  0.26 m MV Vmean:      116.0 cm/s     Systemic Diam: 2.00 cm MV Decel Time: 265 msec MR Peak grad:    129.5 mmHg MR Mean grad:    84.0 mmHg MR Vmax:         569.00 cm/s MR Vmean:        436.0 cm/s MR PISA:         1.01 cm MR PISA Eff ROA: 16 mm MR PISA Radius:  0.40 cm MV E velocity: 158.00 cm/s MV A velocity: 112.00 cm/s MV E/A ratio:  1.41 Fransico Him MD Electronically signed by Fransico Him MD Signature Date/Time: 11/04/2022/10:00:13 PM    Final     Scheduled Meds:  ARIPiprazole  7.5 mg Oral Daily   calcitRIOL  0.25 mcg Oral Q M,W,F-HD   calcium acetate  667 mg Oral TID WC   carvedilol  3.125 mg Oral BID WC   Chlorhexidine Gluconate Cloth  6 each Topical Q0600   darbepoetin (ARANESP) injection - DIALYSIS  100 mcg Subcutaneous Q Mon-1800   heparin  20 Units/kg Dialysis Once in dialysis   hydrALAZINE  50 mg Oral Q8H   hydrOXYzine  25 mg Oral Once   isosorbide mononitrate  60 mg Oral Daily   mupirocin ointment  1 Application Nasal BID   sevelamer carbonate  1,600 mg Oral TID WC   torsemide  100 mg Oral q morning   traZODone  100 mg Oral QHS   Continuous Infusions:  ferric gluconate (FERRLECIT) IVPB Stopped (11/04/22 1910)     LOS: 3 days    Time spent: 35 mins    Jlynn Ly, MD [facility]   If 7PM-7AM, please contact night-coverage

## 2022-11-06 NOTE — Progress Notes (Signed)
Received patient in bed to unit.  Alert and oriented.  Informed consent signed and in chart.   Treatment initiated: [place] Treatment completed: 2009  Patient tolerated well.  Transported back to the room  Alert, without acute distress.  Hand-off given to patient's nurse.   Access used: avf Access issues: none  Total UF removed: 4 liters Medication(s) given: ferric gluconate 125mg /135ml Post HD VS: see table below Post HD weight: 88.7 kg using bed scale    11/06/22 2031  Vitals  BP (!) 150/99  MAP (mmHg) 111  BP Location Right Arm  BP Method Automatic  Patient Position (if appropriate) Lying  Pulse Rate (!) 112  Pulse Rate Source Monitor  ECG Heart Rate (!) 112  Resp (!) 29  Oxygen Therapy  SpO2 100 %  O2 Device Simple Mask  O2 Flow Rate (L/min) 5 L/min  During Treatment Monitoring  Intra-Hemodialysis Comments Tolerated well      [facility]

## 2022-11-06 NOTE — Progress Notes (Signed)
[facility] Progress Note   Subjective:   Seen in room - calm, denies CP or dyspnea. Being considered for pericardial drain, although per notes looks like she refused her repeat echo this AM.  For HD today.  Objective Vitals:   11/05/22 0851 11/05/22 2339 11/06/22 0631 11/06/22 0923  BP: 133/71 139/87 (!) 142/86 (!) 151/108  Pulse: 86 (!) 109 89 (!) 103  Resp: 18 20 18    Temp:  98 F (36.7 C)  98.6 F (37 C)  TempSrc: Oral Oral  Oral  SpO2: 100% 100% 96% 99%  Weight:      Height:       Physical Exam General: Chronically ill appearing woman, NAD. Room air. Heart: RRR; no murmur Lungs: CTAB; no rales Abdomen: soft Extremities: Tense 1+ LE edema - some improvement Dialysis Access: LUE AVF + thrill/bruit  Additional Objective Labs: Basic Metabolic Panel: Recent Labs  Lab 11/02/22 0239  NA 139  K 4.7  CL 100  CO2 27  GLUCOSE 89  BUN 49*  CREATININE 8.93*  CALCIUM 9.0   CBC: Recent Labs  Lab 11/02/22 0239 11/06/22 0542  WBC 9.7  --   HGB 8.5* 7.1*  HCT 28.1* 23.6*  MCV 100.4*  --   PLT 300  --    Studies/Results: ECHOCARDIOGRAM LIMITED  Result Date: 11/05/2022    ECHOCARDIOGRAM LIMITED REPORT   Patient Name:   CAM HARNDEN Date of Exam: 11/05/2022 Medical Rec #:  [id number]         Height:       68.0 in Accession #:    [id number]        Weight:       197.1 lb Date of Birth:  Mar 11, 1996         BSA:          2.031 m Patient Age:    26 years          BP:           133/71 mmHg Patient Gender: F                 HR:           88 bpm. Exam Location:  Inpatient Procedure: Limited Echo and Cardiac Doppler Indications:    Pericardial effusion I31.3  History:        Patient has prior history of Echocardiogram examinations, most                 recent 11/04/2022. CHF, Pericardial Disease, Mitral Valve                 Disease, Signs/Symptoms:Chest Pain; Risk Factors:Hypertension                 and Current Smoker.  Sonographer:    Greer Pickerel Referring Phys:  [place] Comments: Image acquisition challenging due to patient body habitus and Image acquisition challenging due to respiratory motion. IMPRESSIONS  1. Large pericardial effusion in the posterior portion of the left ventricle with small-moderate pericardial effusion in the anterior/RV side. There is no RV or RA collapse. IVC not well visualized. There is excessive respiratory variation in the mitral  valve spectral Doppler velocities.  2. Left ventricular ejection fraction, by estimation, is 55 to 60%. The left ventricle has normal function.  3. The mitral valve is normal in structure. not assessed mitral valve regurgitation. not assesed mitral stenosis.  4. Tricuspid valve regurgitation not assessed. not assessed tricuspid  stenosis.  5. Aortic valve regurgitation not assessed. not assessed. Comparison(s): No significant change from prior study. FINDINGS  Left Ventricle: Left ventricular ejection fraction, by estimation, is 55 to 60%. The left ventricle has normal function. Pericardium: Large pericardial effusion in the posterior portion of the left ventricle with small-moderate pericardial effusion in the anterior/RV side. There is no RV or RA collapse. IVC not well visualized. There is excessive respiratory variation in the mitral valve spectral Doppler velocities. There is excessive respiratory variation in the mitral valve spectral Doppler velocities. Mitral Valve: The mitral valve is normal in structure. Not assessed mitral valve regurgitation. Not assesed mitral valve stenosis. Tricuspid Valve: Tricuspid valve regurgitation not assessed. not assessed tricuspid stenosis. Aortic Valve: Aortic valve regurgitation not assessed. Not assessed. Additional Comments: Spectral Doppler performed.  Berniece Salines DO Electronically signed by Berniece Salines DO Signature Date/Time: 11/05/2022/5:39:34 PM    Final    ECHOCARDIOGRAM COMPLETE  Result Date: 11/04/2022    ECHOCARDIOGRAM REPORT   Patient  Name:   SANAM MARMO Date of Exam: 11/04/2022 Medical Rec #:  [id number]         Height:       68.0 in Accession #:    [id number]        Weight:       205.9 lb Date of Birth:  22-Oct-1996         BSA:          2.069 m Patient Age:    26 years          BP:           129/81 mmHg Patient Gender: F                 HR:           91 bpm. Exam Location:  Inpatient Procedure: 2D Echo, Cardiac Doppler and Color Doppler Indications:    Pericardial effusion  History:        Patient has prior history of Echocardiogram examinations, most                 recent 10/25/2022. CHF, Signs/Symptoms:Chest Pain and Shortness                 of Breath; Risk Factors:Hypertension and Current Smoker. ESRD on                 Dialysis, Polysubstance abuse.  Sonographer:    Wenda Low Referring Phys: [place]  1. Left ventricular ejection fraction, by estimation, is 55 to 60%. The left ventricle has normal function. The left ventricle has no regional wall motion abnormalities. There is severe concentric left ventricular hypertrophy. Left ventricular diastolic  parameters are indeterminate. Elevated left ventricular end-diastolic pressure.  2. Right ventricular systolic function is normal. The right ventricular size is mildly enlarged. There is moderately elevated pulmonary artery systolic pressure. The estimated right ventricular systolic pressure is 14.4 mmHg.  3. The mitral valve is degenerative. Moderate mitral valve regurgitation. Moderate mitral stenosis. The mean mitral valve gradient is 6.0 mmHg. Moderate mitral annular calcification.  4. Tricuspid valve regurgitation is moderate to severe.  5. The aortic valve is tricuspid. Aortic valve regurgitation is trivial. Moderate aortic valve stenosis. Aortic valve area, by VTI measures 1.37 cm. Aortic valve mean gradient measures 24.0 mmHg. Aortic valve Vmax measures 3.42 m/s.  6. The inferior vena cava is dilated in size with <50% respiratory variability,  suggesting right atrial pressure of 15 mmHg.  7. Moderate to large circumferential pericardial effusion with greatest diameter posterior to the LV measuring 3.02cm. There is no RV diastolic collapse or RA inversion. There is excessive respiratory variation in the mitral valve spectral Doppler velocities. The IVC is dilated and plethoric.  8. Compared to prior study dated 10/25/2022, the pericardial effusion has increased in size and now there is excessive respiratory variation in MV doppler velocities but no RV diastolic colllpase or RA inversion. Clinical correlation recommended. FINDINGS  Left Ventricle: Left ventricular ejection fraction, by estimation, is 55 to 60%. The left ventricle has normal function. The left ventricle has no regional wall motion abnormalities. The left ventricular internal cavity size was normal in size. There is  severe concentric left ventricular hypertrophy. Left ventricular diastolic parameters are indeterminate. Elevated left ventricular end-diastolic pressure. Right Ventricle: The right ventricular size is mildly enlarged. No increase in right ventricular wall thickness. Right ventricular systolic function is normal. There is moderately elevated pulmonary artery systolic pressure. The tricuspid regurgitant velocity is 2.99 m/s, and with an assumed right atrial pressure of 15 mmHg, the estimated right ventricular systolic pressure is 73.4 mmHg. Left Atrium: Left atrial size was normal in size. Right Atrium: Right atrial size was normal in size. Pericardium: Moderate to large circumferential pericardial effusion with greatest diameter posterior to the LV measuring 3.02cm. There is no RV diastolic collapse or RA inversion. A moderately sized pericardial effusion is present. The pericardial effusion is circumferential. There is excessive respiratory variation in the mitral valve spectral Doppler velocities. Mitral Valve: The mitral valve is degenerative in appearance. There is moderate  thickening of the mitral valve leaflet(s). Moderate mitral annular calcification. Moderate mitral valve regurgitation. Moderate mitral valve stenosis. MV peak gradient, 11.0 mmHg. The mean mitral valve gradient is 6.0 mmHg. Tricuspid Valve: The tricuspid valve is normal in structure. Tricuspid valve regurgitation is moderate to severe. No evidence of tricuspid stenosis. Aortic Valve: The aortic valve is tricuspid. Aortic valve regurgitation is trivial. Moderate aortic stenosis is present. Aortic valve mean gradient measures 24.0 mmHg. Aortic valve peak gradient measures 46.9 mmHg. Aortic valve area, by VTI measures 1.37  cm. Pulmonic Valve: The pulmonic valve was normal in structure. Pulmonic valve regurgitation is not visualized. No evidence of pulmonic stenosis. Aorta: The aortic root is normal in size and structure. Venous: The inferior vena cava is dilated in size with less than 50% respiratory variability, suggesting right atrial pressure of 15 mmHg. IAS/Shunts: No atrial level shunt detected by color flow Doppler.  LEFT VENTRICLE PLAX 2D LVIDd:         4.80 cm   Diastology LVIDs:         3.20 cm   LV e' medial:    8.70 cm/s LV PW:         1.60 cm   LV E/e' medial:  18.2 LV IVS:        1.60 cm   LV e' lateral:   6.64 cm/s LVOT diam:     2.00 cm   LV E/e' lateral: 23.8 LV SV:         81 LV SV Index:   39 LVOT Area:     3.14 cm  RIGHT VENTRICLE RV Basal diam:  3.90 cm RV Mid diam:    3.10 cm RV S prime:     10.90 cm/s TAPSE (M-mode): 2.2 cm LEFT ATRIUM             Index        RIGHT ATRIUM  Index LA diam:        4.20 cm 2.03 cm/m   RA Area:     18.60 cm LA Vol (A2C):   88.1 ml 42.58 ml/m  RA Volume:   54.30 ml  26.24 ml/m LA Vol (A4C):   51.3 ml 24.79 ml/m LA Biplane Vol: 67.3 ml 32.52 ml/m  AORTIC VALVE                     PULMONIC VALVE AV Area (Vmax):    1.29 cm      PV Vmax:       1.49 m/s AV Area (Vmean):   1.35 cm      PV Peak grad:  8.9 mmHg AV Area (VTI):     1.37 cm AV Vmax:            342.25 cm/s AV Vmean:          223.750 cm/s AV VTI:            0.593 m AV Peak Grad:      46.9 mmHg AV Mean Grad:      24.0 mmHg LVOT Vmax:         140.00 cm/s LVOT Vmean:        96.200 cm/s LVOT VTI:          0.259 m LVOT/AV VTI ratio: 0.44  AORTA Ao Root diam: 3.20 cm Ao Asc diam:  3.60 cm MITRAL VALVE                  TRICUSPID VALVE MV Area (PHT): 2.86 cm       TR Peak grad:   35.8 mmHg MV Area VTI:   1.85 cm       TR Vmax:        299.00 cm/s MV Peak grad:  11.0 mmHg MV Mean grad:  6.0 mmHg       SHUNTS MV Vmax:       1.66 m/s       Systemic VTI:  0.26 m MV Vmean:      116.0 cm/s     Systemic Diam: 2.00 cm MV Decel Time: 265 msec MR Peak grad:    129.5 mmHg MR Mean grad:    84.0 mmHg MR Vmax:         569.00 cm/s MR Vmean:        436.0 cm/s MR PISA:         1.01 cm MR PISA Eff ROA: 16 mm MR PISA Radius:  0.40 cm MV E velocity: 158.00 cm/s MV A velocity: 112.00 cm/s MV E/A ratio:  1.41 Fransico Him MD Electronically signed by Fransico Him MD Signature Date/Time: 11/04/2022/10:00:13 PM    Final     Medications:  ferric gluconate (FERRLECIT) IVPB Stopped (11/04/22 1910)    ARIPiprazole  7.5 mg Oral Daily   calcitRIOL  0.25 mcg Oral Q M,W,F-HD   calcium acetate  667 mg Oral TID WC   carvedilol  3.125 mg Oral BID WC   Chlorhexidine Gluconate Cloth  6 each Topical Q0600   darbepoetin (ARANESP) injection - DIALYSIS  100 mcg Subcutaneous Q Mon-1800   heparin  20 Units/kg Dialysis Once in dialysis   hydrALAZINE  50 mg Oral BID   hydrOXYzine  25 mg Oral Once   isosorbide mononitrate  60 mg Oral Daily   mupirocin ointment  1 Application Nasal BID   sevelamer carbonate  1,600 mg Oral TID WC   torsemide  100 mg Oral q morning   traZODone  100 mg Oral QHS    Dialysis Orders: MWF at [facility], BFR 400, EDW 92kg -> down to 89kg here -> needs to go further, 2K/2Ca, LUE AVF - Mircera 227mcg Iv q 2 weeks   Assessment/Plan: ESRD: Usual MWF schedule - for HD today. Historically very non-compliant - better  with hospitalization and incarceration. Volume/ hypertension: with severe LVH. Losing weight in jail, will lower EDW and continue to challenge as tolerated. Adjusting meds per cardiology recommendations: d/c diltiazem, start low dose coreg, ^ iso/hydralazine. Remains on torsemide as well. Anemia of Chronic Kidney Disease: Hemoglobin down to 7.1. Given Aranesp 188mcg on 12/4. Recent 11/26 iron sat 6%, giving IV iron load here. Transfuse 1U PRBCs today if agreeable. Secondary Hyperparathyroidism/Hyperphosphatemia: Phos 8.4. Continue home phoslo and calcitriol Vascular access: LUE AVF with no issues Chest pain: Trops high but stable - likely related to demand - cardiology following. Volume removal with HD as tolerated.  Plan for outpt nuc stress test.  Severe TR: plans to repeat TTE when euvolemic - cardiology following Pericardial effusion: Cardiology following - consideration for pericardial drain, but unclear if she will agree to this. Her effusion is likely related to chronic uremia and overload - both only recently improved.  [name], PA-C 11/06/2022, 11:29 AM  [employer]

## 2022-11-07 DIAGNOSIS — E8770 Fluid overload, unspecified: Secondary | ICD-10-CM | POA: Diagnosis not present

## 2022-11-07 DIAGNOSIS — I3139 Other pericardial effusion (noninflammatory): Secondary | ICD-10-CM | POA: Diagnosis not present

## 2022-11-07 LAB — TYPE AND SCREEN
ABO/RH(D): A NEG
Antibody Screen: NEGATIVE
Unit division: 0

## 2022-11-07 LAB — BPAM RBC
Blood Product Expiration Date: 202312302359
ISSUE DATE / TIME: 202312081703
Unit Type and Rh: 600

## 2022-11-07 LAB — HEMOGLOBIN AND HEMATOCRIT, BLOOD
HCT: 26.6 % — ABNORMAL LOW (ref 36.0–46.0)
Hemoglobin: 8.1 g/dL — ABNORMAL LOW (ref 12.0–15.0)

## 2022-11-07 MED ORDER — CARVEDILOL 6.25 MG PO TABS
6.2500 mg | ORAL_TABLET | Freq: Two times a day (BID) | ORAL | Status: DC
  Administered 2022-11-07 – 2022-11-10 (×7): 6.25 mg via ORAL
  Filled 2022-11-07 (×7): qty 1

## 2022-11-07 MED ORDER — CEFAZOLIN SODIUM-DEXTROSE 1-4 GM/50ML-% IV SOLN: 1.0000 g | INTRAVENOUS | Status: AC

## 2022-11-07 NOTE — Progress Notes (Signed)
[facility] Progress Note   Subjective:  Seen in room - says breathing a little better. Tells me plan is for pericardial drain on Monday? Dialyzed yesterday - 4L off, did fine.   Objective Vitals:   11/06/22 2138 11/07/22 0132 11/07/22 0453 11/07/22 0919  BP: (!) 150/102 (!) 142/98 134/88 (!) 137/98  Pulse: (!) 123 (!) 125 (!) 123 (!) 120  Resp: 18  18 18   Temp: 99.4 F (37.4 C) 97.9 F (36.6 C) 98.6 F (37 C)   TempSrc: Oral Oral Oral   SpO2: 93% 100% (!) 89% 100%  Weight:      Height:       Physical Exam General: Chronically ill appearing woman, NAD. Nasal O2 in place Heart: Tachycardic, 3/6 murmur Lungs: CTAB; no rales Abdomen: soft Extremities: Tense 1+ LE edema - some improvement Dialysis Access: LUE AVF + thrill/bruit  Additional Objective Labs: Basic Metabolic Panel: Recent Labs  Lab 11/02/22 0239  NA 139  K 4.7  CL 100  CO2 27  GLUCOSE 89  BUN 49*  CREATININE 8.93*  CALCIUM 9.0   CBC: Recent Labs  Lab 11/02/22 0239 11/06/22 0542 11/07/22 0055  WBC 9.7  --   --   HGB 8.5* 7.1* 8.1*  HCT 28.1* 23.6* 26.6*  MCV 100.4*  --   --   PLT 300  --   --    Studies/Results: ECHOCARDIOGRAM LIMITED  Result Date: 11/05/2022    ECHOCARDIOGRAM LIMITED REPORT   Patient Name:   Natasha Long Date of Exam: 11/05/2022 Medical Rec #:  [id number]         Height:       68.0 in Accession #:    [id number]        Weight:       197.1 lb Date of Birth:  12-12-1995         BSA:          2.031 m Patient Age:    26 years          BP:           133/71 mmHg Patient Gender: F                 HR:           88 bpm. Exam Location:  Inpatient Procedure: Limited Echo and Cardiac Doppler Indications:    Pericardial effusion I31.3  History:        Patient has prior history of Echocardiogram examinations, most                 recent 11/04/2022. CHF, Pericardial Disease, Mitral Valve                 Disease, Signs/Symptoms:Chest Pain; Risk Factors:Hypertension                  and Current Smoker.  Sonographer:    Greer Pickerel Referring Phys: [place] Comments: Image acquisition challenging due to patient body habitus and Image acquisition challenging due to respiratory motion. IMPRESSIONS  1. Large pericardial effusion in the posterior portion of the left ventricle with small-moderate pericardial effusion in the anterior/RV side. There is no RV or RA collapse. IVC not well visualized. There is excessive respiratory variation in the mitral  valve spectral Doppler velocities.  2. Left ventricular ejection fraction, by estimation, is 55 to 60%. The left ventricle has normal function.  3. The mitral valve is normal in structure. not assessed  mitral valve regurgitation. not assesed mitral stenosis.  4. Tricuspid valve regurgitation not assessed. not assessed tricuspid stenosis.  5. Aortic valve regurgitation not assessed. not assessed. Comparison(s): No significant change from prior study. FINDINGS  Left Ventricle: Left ventricular ejection fraction, by estimation, is 55 to 60%. The left ventricle has normal function. Pericardium: Large pericardial effusion in the posterior portion of the left ventricle with small-moderate pericardial effusion in the anterior/RV side. There is no RV or RA collapse. IVC not well visualized. There is excessive respiratory variation in the mitral valve spectral Doppler velocities. There is excessive respiratory variation in the mitral valve spectral Doppler velocities. Mitral Valve: The mitral valve is normal in structure. Not assessed mitral valve regurgitation. Not assesed mitral valve stenosis. Tricuspid Valve: Tricuspid valve regurgitation not assessed. not assessed tricuspid stenosis. Aortic Valve: Aortic valve regurgitation not assessed. Not assessed. Additional Comments: Spectral Doppler performed.  Godfrey Pick Tobb DO Electronically signed by Berniece Salines DO Signature Date/Time: 11/05/2022/5:39:34 PM    Final     Medications:  [START  ON 11/09/2022]  ceFAZolin (ANCEF) IV     ferric gluconate (FERRLECIT) IVPB 125 mg (11/06/22 1907)    sodium chloride   Intravenous Once   ARIPiprazole  7.5 mg Oral Daily   calcitRIOL  0.25 mcg Oral Q M,W,F-HD   calcium acetate  667 mg Oral TID WC   carvedilol  3.125 mg Oral BID WC   Chlorhexidine Gluconate Cloth  6 each Topical Q0600   darbepoetin (ARANESP) injection - DIALYSIS  100 mcg Subcutaneous Q Mon-1800   hydrALAZINE  50 mg Oral Q8H   isosorbide mononitrate  60 mg Oral Daily   mupirocin ointment  1 Application Nasal BID   sevelamer carbonate  1,600 mg Oral TID WC   torsemide  100 mg Oral q morning   traZODone  100 mg Oral QHS    Dialysis Orders: MWF at [facility], BFR 400, EDW 92kg -> down to 89kg here -> needs to go further, 2K/2Ca, LUE AVF - Mircera 258mcg Iv q 2 weeks   Assessment/Plan: ESRD: Usual MWF schedule. Historically very non-compliant - better with hospitalization and incarceration. Next HD planned for Monday 12/11. Volume/ hypertension: with severe LVH. Losing weight in jail, will lower EDW and continue to challenge as tolerated. Adjusting meds per cardiology recommendations: d/c diltiazem, start low dose coreg, ^ iso/hydralazine. Remains on torsemide as well. Tachycardic - will try to ^ Coreg to 6.25mg  BID. Anemia of Chronic Kidney Disease: Hemoglobin down to 7.1. Given Aranesp 179mcg on 12/4. Recent 11/26 iron sat 6%, giving IV iron load here. Given 1U PRBCs 12/7. Hgb up to 8.1 today. Secondary Hyperparathyroidism/Hyperphosphatemia: Phos 8.4. Continue home phoslo and calcitriol Vascular access: LUE AVF with no issues Chest pain: Trops high but stable - likely related to demand - cardiology following. Volume removal with HD as tolerated.  Plan for outpt nuc stress test.  Severe TR: plans to repeat TTE when euvolemic - cardiology following Pericardial effusion: Cardiology following - consideration for pericardial drain, but unclear if she will agree to this. Her  effusion is likely related to chronic uremia and overload - both only recently improved.  [name], PA-C 11/07/2022, 9:53 AM  [employer]

## 2022-11-07 NOTE — Progress Notes (Signed)
PROGRESS NOTE    Natasha Long  IRJ:[id number] DOB: 07/20/1996 DOA: 11/01/2022 PCP: [name], MD   Brief Narrative:  This 26 yrs old female with PMH significant of asthma, hypertension, chronic combined systolic heart failure,, chronic non-specific chest pain, severe mitral regurgitation, pericardial effusion without cardiac tamponade, pleural effusion, focal glomerulosclerosis, ESRD on hemodialysis, secondary hyperparathyroidism, iron deficiency / ESRD anemia, class I obesity, borderline personality disorder, recurrent depression without psychotic features, polysubstance abuse (cannabis, cocaine, nicotine,  occasional alcohol use), PTSD, homelessness, insomnia, peripheral neuropathy, syphilis, septic arthritis of multiple joints who was recently admitted for acute respiratory failure in the setting of volume overload causing pulmonary edema, rhinovirus infection, hypertensive urgency and discharged to the local jail.  Patient again presents at this time with substernal chest pain for 2 days.  Not a good historian.  In the ED, patient had stable vitals including blood pressure.  Labs showed hemoglobin of 8.5 with elevated troponin at 365 and then 366 ng/L.  BNP was 1485.8 pg/mL.  BMP showed  BUN of 49 and creatinine of 8.93 mg/dL.  Chest x-ray showed moderate large heart silhouette with central vascular prominence without overt edema.  Moderate to large right and small left pleural effusions with overlying atelectasis or consolidations similar in appearance to 10/25/2022. Patient was then admitted hospital for further evaluation and treatment.   Assessment & Plan:   Principal Problem:   Volume overload Active Problems:   Primary hypertension   Elevated troponin   ESRD (end stage renal disease) on dialysis (HCC)   Pericardial effusion   Anemia in chronic kidney disease   Severe recurrent major depression without psychotic features (HCC)   Borderline personality disorder (HCC)   ESRD  (end stage renal disease) (HCC)   Moderate protein-calorie malnutrition (HCC)   Acute diastolic CHF (congestive heart failure) (HCC)   Mitral valve disease   ESRD on hemodialysis (HCC)   Chest pain   Volume overload: ESRD on hemodialysis: Acute on chronic combined systolic and diastolic heart failure: Patient presented with severe shortness of breath. Continue telemetry monitoring, oxygen supplementation, continue torsemide 100 mg daily.   Continue intake and output charting, daily weights.  No ACE inhibitor's or beta-blocker.   Cardiology and Nephrology has been consulted.  Patient has undergone hemodialysis at this time.   2D echo June 2023 with LV ejection fraction of 45 to 50%.  Coumadin on hold to CHF.   Plans to repeat 2D echocardiogram when volume status is euvolemic.   Counseled her regarding fluid restriction and compliance with dialysis. Volume management as per HD and diuresis.   Chronic chest pain, elevated troponin: EKG nonischemic.  Troponin elevated but not uptrending. Cardiology recommends outpatient follow-up with stress test and aggressive hemodialysis.   Essential hypertension: Continue Cardizem and torsemide.   Cardiology recommends Imdur 30 and hydralazine 25 mg 3 times daily as tolerated.   Continue Hydralazine 10 mg Iv q6hr as needed for SBP above 170 for better BP control.   Pericardial effusion without tamponade: Chest pain could be uremic pericarditis. Controlled. Echo shows pericardial effusion, seems consistent with uremic pericarditis. She may eventually require pericardial window if it does not get better with hemodialysis. Cardiothoracic surgery consulted.  Patient is scheduled to have pericardial window.  No evidence of tamponade on echo.  ESRD on hemodialysis: Nephrology on board.   Continue dialysis as per nephro .  Next dialysis 11/09/2022 Counseled her regarding need for full attendance with dialysis.   Anemia of chronic kidney  disease: Erythropoietin  as per nephrology. Hb 7.1, s/p 1 unit PRBC hemoglobin 8.1  Monitor H/H.   Major depression recurrent. Borderline personality disorder Continue Abilify, hydroxyzine, trazodone.   Moderate protein-calorie malnutrition ([place]) Nutrition has been consulted.   DVT prophylaxis: SCDs Code Status: Full code Family Communication: No family at bedside Disposition Plan:   Status is: Inpatient Remains inpatient appropriate because: To [facility] likely in 1-2 days.   Consultants:  Cardiology Nephrology  Procedures:  Hemodialysis Antimicrobials:  None  Subjective: Patient was seen and examined at bedside.  Overnight events noted. Patient continued to complain about shortness of breath.  Sitting comfortably. Patient is scheduled to have pericardial window, tentatively on Monday.  Objective: Vitals:   11/06/22 2138 11/07/22 0132 11/07/22 0453 11/07/22 0919  BP: (!) 150/102 (!) 142/98 134/88 (!) 137/98  Pulse: (!) 123 (!) 125 (!) 123 (!) 120  Resp: 18  18 18   Temp: 99.4 F (37.4 C) 97.9 F (36.6 C) 98.6 F (37 C)   TempSrc: Oral Oral Oral   SpO2: 93% 100% (!) 89% 100%  Weight:      Height:        Intake/Output Summary (Last 24 hours) at 11/07/2022 1310 Last data filed at 11/07/2022 1308 Gross per 24 hour  Intake 1288.17 ml  Output 4000 ml  Net -2711.83 ml   Filed Weights   11/04/22 2015 11/06/22 1515 11/06/22 2043  Weight: 89.4 kg 96.3 kg 88.7 kg    Examination:  General exam: Appears comfortable, not in any acute distress, deconditioned. Respiratory system: Decreased breath sounds, no accessory muscle use, respiratory effort normal, RR 13. Cardiovascular system: S1-S2 heard, regular rate and rhythm, no murmur. Gastrointestinal system: Abdomen is soft, non tender, non distended, BS+ Central nervous system: Alert and oriented x 3, no focal neurological deficits. Extremities: Edema+, no cyanosis, no clubbing. Skin: No rashes, lesions or  ulcers Psychiatry: Judgement and insight appear normal. Mood & affect appropriate.     Data Reviewed: I have personally reviewed following labs and imaging studies  CBC: Recent Labs  Lab 11/02/22 0239 11/06/22 0542 11/07/22 0055  WBC 9.7  --   --   HGB 8.5* 7.1* 8.1*  HCT 28.1* 23.6* 26.6*  MCV 100.4*  --   --   PLT 300  --   --    Basic Metabolic Panel: Recent Labs  Lab 11/02/22 0239  NA 139  K 4.7  CL 100  CO2 27  GLUCOSE 89  BUN 49*  CREATININE 8.93*  CALCIUM 9.0   GFR: Estimated Creatinine Clearance: 10.9 mL/min (A) (by C-G formula based on SCr of 8.93 mg/dL (H)). Liver Function Tests: No results for input(s): "AST", "ALT", "ALKPHOS", "BILITOT", "PROT", "ALBUMIN" in the last 168 hours. No results for input(s): "LIPASE", "AMYLASE" in the last 168 hours. No results for input(s): "AMMONIA" in the last 168 hours. Coagulation Profile: No results for input(s): "INR", "PROTIME" in the last 168 hours. Cardiac Enzymes: No results for input(s): "CKTOTAL", "CKMB", "CKMBINDEX", "TROPONINI" in the last 168 hours. BNP (last 3 results) No results for input(s): "PROBNP" in the last 8760 hours. HbA1C: No results for input(s): "HGBA1C" in the last 72 hours. CBG: No results for input(s): "GLUCAP" in the last 168 hours. Lipid Profile: No results for input(s): "CHOL", "HDL", "LDLCALC", "TRIG", "CHOLHDL", "LDLDIRECT" in the last 72 hours. Thyroid Function Tests: No results for input(s): "TSH", "T4TOTAL", "FREET4", "T3FREE", "THYROIDAB" in the last 72 hours. Anemia Panel: No results for input(s): "VITAMINB12", "FOLATE", "FERRITIN", "TIBC", "IRON", "RETICCTPCT" in  the last 72 hours. Sepsis Labs: No results for input(s): "PROCALCITON", "LATICACIDVEN" in the last 168 hours.  Recent Results (from the past 240 hour(s))  Resp Panel by RT-PCR (Flu A&B, Covid) Anterior Nasal Swab     Status: None   Collection Time: 11/02/22  4:33 AM   Specimen: Anterior Nasal Swab  Result Value Ref  Range Status   SARS Coronavirus 2 by RT PCR NEGATIVE NEGATIVE Final    Comment: (NOTE) SARS-CoV-2 target nucleic acids are NOT DETECTED.  The SARS-CoV-2 RNA is generally detectable in upper respiratory specimens during the acute phase of infection. The lowest concentration of SARS-CoV-2 viral copies this assay can detect is 138 copies/mL. A negative result does not preclude SARS-Cov-2 infection and should not be used as the sole basis for treatment or other patient management decisions. A negative result may occur with  improper specimen collection/handling, submission of specimen other than nasopharyngeal swab, presence of viral mutation(s) within the areas targeted by this assay, and inadequate number of viral copies(<138 copies/mL). A negative result must be combined with clinical observations, patient history, and epidemiological information. The expected result is Negative.  Fact Sheet for Patients:  EntrepreneurPulse.com.au  Fact Sheet for Healthcare Providers:  IncredibleEmployment.be  This test is no t yet approved or cleared by the [place] FDA and  has been authorized for detection and/or diagnosis of SARS-CoV-2 by FDA under an Emergency Use Authorization (EUA). This EUA will remain  in effect (meaning this test can be used) for the duration of the COVID-19 declaration under Section 564(b)(1) of the Act, 21 U.S.C.section 360bbb-3(b)(1), unless the authorization is terminated  or revoked sooner.       Influenza A by PCR NEGATIVE NEGATIVE Final   Influenza B by PCR NEGATIVE NEGATIVE Final    Comment: (NOTE) The Xpert Xpress SARS-CoV-2/FLU/RSV plus assay is intended as an aid in the diagnosis of influenza from Nasopharyngeal swab specimens and should not be used as a sole basis for treatment. Nasal washings and aspirates are unacceptable for Xpert Xpress SARS-CoV-2/FLU/RSV testing.  Fact Sheet for  Patients: EntrepreneurPulse.com.au  Fact Sheet for Healthcare Providers: IncredibleEmployment.be  This test is not yet approved or cleared by the [place] FDA and has been authorized for detection and/or diagnosis of SARS-CoV-2 by FDA under an Emergency Use Authorization (EUA). This EUA will remain in effect (meaning this test can be used) for the duration of the COVID-19 declaration under Section 564(b)(1) of the Act, 21 U.S.C. section 360bbb-3(b)(1), unless the authorization is terminated or revoked.  Performed at [facility], [place] [address] [place], [place] [address]   MRSA Next Gen by PCR, Nasal     Status: Abnormal   Collection Time: 11/05/22  5:37 PM   Specimen: Nasal Mucosa; Nasal Swab  Result Value Ref Range Status   MRSA by PCR Next Gen DETECTED (A) NOT DETECTED Final    Comment: RESULT CALLED TO, READ BACK BY AND VERIFIED WITH: RN GABBY BAUTISTA ON 11/05/22 @ 2007 BY DRT (NOTE) The GeneXpert MRSA Assay (FDA approved for NASAL specimens only), is one component of a comprehensive MRSA colonization surveillance program. It is not intended to diagnose MRSA infection nor to guide or monitor treatment for MRSA infections. Test performance is not FDA approved in patients less than 30 years old. Performed at [facility], [place] [address] [place], [place] [address]     Radiology Studies: ECHOCARDIOGRAM LIMITED  Result Date: 11/05/2022    ECHOCARDIOGRAM LIMITED REPORT  Patient Name:   AMY GOTHARD Date of Exam: 11/05/2022 Medical Rec #:  [id number]         Height:       68.0 in Accession #:    [id number]        Weight:       197.1 lb Date of Birth:  1996/09/23         BSA:          2.031 m Patient Age:    26 years          BP:           133/71 mmHg Patient Gender: F                 HR:           88 bpm. Exam Location:  Inpatient Procedure: Limited Echo and Cardiac Doppler Indications:    Pericardial effusion  I31.3  History:        Patient has prior history of Echocardiogram examinations, most                 recent 11/04/2022. CHF, Pericardial Disease, Mitral Valve                 Disease, Signs/Symptoms:Chest Pain; Risk Factors:Hypertension                 and Current Smoker.  Sonographer:    Greer Pickerel Referring Phys: [place] Comments: Image acquisition challenging due to patient body habitus and Image acquisition challenging due to respiratory motion. IMPRESSIONS  1. Large pericardial effusion in the posterior portion of the left ventricle with small-moderate pericardial effusion in the anterior/RV side. There is no RV or RA collapse. IVC not well visualized. There is excessive respiratory variation in the mitral  valve spectral Doppler velocities.  2. Left ventricular ejection fraction, by estimation, is 55 to 60%. The left ventricle has normal function.  3. The mitral valve is normal in structure. not assessed mitral valve regurgitation. not assesed mitral stenosis.  4. Tricuspid valve regurgitation not assessed. not assessed tricuspid stenosis.  5. Aortic valve regurgitation not assessed. not assessed. Comparison(s): No significant change from prior study. FINDINGS  Left Ventricle: Left ventricular ejection fraction, by estimation, is 55 to 60%. The left ventricle has normal function. Pericardium: Large pericardial effusion in the posterior portion of the left ventricle with small-moderate pericardial effusion in the anterior/RV side. There is no RV or RA collapse. IVC not well visualized. There is excessive respiratory variation in the mitral valve spectral Doppler velocities. There is excessive respiratory variation in the mitral valve spectral Doppler velocities. Mitral Valve: The mitral valve is normal in structure. Not assessed mitral valve regurgitation. Not assesed mitral valve stenosis. Tricuspid Valve: Tricuspid valve regurgitation not assessed. not assessed tricuspid stenosis.  Aortic Valve: Aortic valve regurgitation not assessed. Not assessed. Additional Comments: Spectral Doppler performed.  Godfrey Pick Tobb DO Electronically signed by Berniece Salines DO Signature Date/Time: 11/05/2022/5:39:34 PM    Final     Scheduled Meds:  sodium chloride   Intravenous Once   ARIPiprazole  7.5 mg Oral Daily   calcitRIOL  0.25 mcg Oral Q M,W,F-HD   calcium acetate  667 mg Oral TID WC   carvedilol  6.25 mg Oral BID WC   Chlorhexidine Gluconate Cloth  6 each Topical Q0600   darbepoetin (ARANESP) injection - DIALYSIS  100 mcg Subcutaneous Q Mon-1800   hydrALAZINE  50 mg Oral Q8H   isosorbide  mononitrate  60 mg Oral Daily   mupirocin ointment  1 Application Nasal BID   sevelamer carbonate  1,600 mg Oral TID WC   torsemide  100 mg Oral q morning   traZODone  100 mg Oral QHS   Continuous Infusions:  [START ON 11/09/2022]  ceFAZolin (ANCEF) IV     ferric gluconate (FERRLECIT) IVPB 125 mg (11/06/22 1907)     LOS: 4 days    Time spent: 35 mins    Emeree Mahler, MD [facility]   If 7PM-7AM, please contact night-coverage

## 2022-11-07 NOTE — Progress Notes (Signed)
Progress Note  Patient Name: [name] Date of Encounter: 11/07/2022  Primary Cardiologist:   Sinclair Grooms, MD   Subjective   She is not waking up or answering questions.  Nurse reports that this is volitional.  No sedation given.  Non focal.  She does respond "yes" to the question of pain but is not localizing or quantifying.   Inpatient Medications    Scheduled Meds:  sodium chloride   Intravenous Once   ARIPiprazole  7.5 mg Oral Daily   calcitRIOL  0.25 mcg Oral Q M,W,F-HD   calcium acetate  667 mg Oral TID WC   carvedilol  6.25 mg Oral BID WC   Chlorhexidine Gluconate Cloth  6 each Topical Q0600   darbepoetin (ARANESP) injection - DIALYSIS  100 mcg Subcutaneous Q Mon-1800   hydrALAZINE  50 mg Oral Q8H   isosorbide mononitrate  60 mg Oral Daily   mupirocin ointment  1 Application Nasal BID   sevelamer carbonate  1,600 mg Oral TID WC   torsemide  100 mg Oral q morning   traZODone  100 mg Oral QHS   Continuous Infusions:  [START ON 11/09/2022]  ceFAZolin (ANCEF) IV     ferric gluconate (FERRLECIT) IVPB 125 mg (11/06/22 1907)   PRN Meds: acetaminophen **OR** acetaminophen, dicyclomine, guaiFENesin, hydrALAZINE, hydrOXYzine, methocarbamol, ondansetron **OR** ondansetron (ZOFRAN) IV   Vital Signs    Vitals:   11/06/22 2138 11/07/22 0132 11/07/22 0453 11/07/22 0919  BP: (!) 150/102 (!) 142/98 134/88 (!) 137/98  Pulse: (!) 123 (!) 125 (!) 123 (!) 120  Resp: 18  18 18   Temp: 99.4 F (37.4 C) 97.9 F (36.6 C) 98.6 F (37 C)   TempSrc: Oral Oral Oral   SpO2: 93% 100% (!) 89% 100%  Weight:      Height:        Intake/Output Summary (Last 24 hours) at 11/07/2022 1021 Last data filed at 11/07/2022 0920 Gross per 24 hour  Intake 933.17 ml  Output 4000 ml  Net -3066.83 ml   Filed Weights   11/04/22 2015 11/06/22 1515 11/06/22 2043  Weight: 89.4 kg 96.3 kg 88.7 kg    Telemetry    ST - Personally Reviewed  ECG    NA - Personally  Reviewed  Physical Exam   GEN: No acute distress.   Neck:   Unable to assess JVD Cardiac: RRR, no murmurs, rubs, or gallops.  Respiratory: Clear  to auscultation bilaterally. GI: Soft, nontender, non-distended  MS: No  edema; No deformity. Neuro:  Unable to assess  Psych:    Unable to assess.   Labs    Chemistry Recent Labs  Lab 11/02/22 0239  NA 139  K 4.7  CL 100  CO2 27  GLUCOSE 89  BUN 49*  CREATININE 8.93*  CALCIUM 9.0  GFRNONAA 6*  ANIONGAP 12     Hematology Recent Labs  Lab 11/02/22 0239 11/06/22 0542 11/07/22 0055  WBC 9.7  --   --   RBC 2.80*  --   --   HGB 8.5* 7.1* 8.1*  HCT 28.1* 23.6* 26.6*  MCV 100.4*  --   --   MCH 30.4  --   --   MCHC 30.2  --   --   RDW 19.0*  --   --   PLT 300  --   --     Cardiac EnzymesNo results for input(s): "TROPONINI" in the last 168 hours. No results for input(s): "TROPIPOC" in the  last 168 hours.   BNP Recent Labs  Lab 11/02/22 0239  BNP 1,485.8*     DDimer No results for input(s): "DDIMER" in the last 168 hours.   Radiology    ECHOCARDIOGRAM LIMITED  Result Date: 11/05/2022    ECHOCARDIOGRAM LIMITED REPORT   Patient Name:   ADALYNA GODBEE Date of Exam: 11/05/2022 Medical Rec #:  [id number]         Height:       68.0 in Accession #:    [id number]        Weight:       197.1 lb Date of Birth:  Apr 14, 1996         BSA:          2.031 m Patient Age:    26 years          BP:           133/71 mmHg Patient Gender: F                 HR:           88 bpm. Exam Location:  Inpatient Procedure: Limited Echo and Cardiac Doppler Indications:    Pericardial effusion I31.3  History:        Patient has prior history of Echocardiogram examinations, most                 recent 11/04/2022. CHF, Pericardial Disease, Mitral Valve                 Disease, Signs/Symptoms:Chest Pain; Risk Factors:Hypertension                 and Current Smoker.  Sonographer:    Greer Pickerel Referring Phys: [place] Comments: Image  acquisition challenging due to patient body habitus and Image acquisition challenging due to respiratory motion. IMPRESSIONS  1. Large pericardial effusion in the posterior portion of the left ventricle with small-moderate pericardial effusion in the anterior/RV side. There is no RV or RA collapse. IVC not well visualized. There is excessive respiratory variation in the mitral  valve spectral Doppler velocities.  2. Left ventricular ejection fraction, by estimation, is 55 to 60%. The left ventricle has normal function.  3. The mitral valve is normal in structure. not assessed mitral valve regurgitation. not assesed mitral stenosis.  4. Tricuspid valve regurgitation not assessed. not assessed tricuspid stenosis.  5. Aortic valve regurgitation not assessed. not assessed. Comparison(s): No significant change from prior study. FINDINGS  Left Ventricle: Left ventricular ejection fraction, by estimation, is 55 to 60%. The left ventricle has normal function. Pericardium: Large pericardial effusion in the posterior portion of the left ventricle with small-moderate pericardial effusion in the anterior/RV side. There is no RV or RA collapse. IVC not well visualized. There is excessive respiratory variation in the mitral valve spectral Doppler velocities. There is excessive respiratory variation in the mitral valve spectral Doppler velocities. Mitral Valve: The mitral valve is normal in structure. Not assessed mitral valve regurgitation. Not assesed mitral valve stenosis. Tricuspid Valve: Tricuspid valve regurgitation not assessed. not assessed tricuspid stenosis. Aortic Valve: Aortic valve regurgitation not assessed. Not assessed. Additional Comments: Spectral Doppler performed.  Godfrey Pick Tobb DO Electronically signed by Berniece Salines DO Signature Date/Time: 11/05/2022/5:39:34 PM    Final     Cardiac Studies   ECHO:    1. Large pericardial effusion in the posterior portion of the left  ventricle with small-moderate  pericardial effusion in the anterior/RV  side. There is no RV or RA collapse. IVC not well visualized. There is  excessive respiratory variation in the mitral   valve spectral Doppler velocities.   2. Left ventricular ejection fraction, by estimation, is 55 to 60%. The  left ventricle has normal function.   3. The mitral valve is normal in structure. not assessed mitral valve  regurgitation. not assesed mitral stenosis.   4. Tricuspid valve regurgitation not assessed. not assessed tricuspid  stenosis.   5. Aortic valve regurgitation not assessed. not assessed.    Patient Profile     26 y.o. female  with a hx of end-stage renal disease on dialysis, hypertension, borderline personality disorder, PTSD and CHF followed by Dr. Tamala Julian with cardiology who is being seen for the evaluation of chest pain at the request of Tennis Must, MD.   Assessment & Plan    Pericardial effusion:    She has been seen by T[facility].  Possible pericardial window.   However, she is diuresis.  No evidence of tamponade on echo or suggestion with clinical picture.  I suggest that we follow this clinically but will discuss with TCTS.     Acute on chronic HFmrEF:    Volume per dialysis.   Meds adjusted this admission.   Agree with increased beta blocker.  We will continue to titrate.    Hypertensive urgency:   BP improved.   Patient refused the AM dose of hydralazine.    Elevated troponin:  I don't suspect ACS.  Likely related to demand ischemia/hypertensive urgency.    MR/MI:   At this point she would be a very poor candidate for valve replacement surgery.    Needs continued medical management and optimization of her chronic medical conditions and continued help and stabilization of her social situation.      For questions or updates, please contact [place] Please consult [email or web address] for contact info under Cardiology/STEMI.   Signed, Minus Breeding, MD  11/07/2022, 10:21 AM

## 2022-11-08 DIAGNOSIS — I3139 Other pericardial effusion (noninflammatory): Secondary | ICD-10-CM | POA: Diagnosis not present

## 2022-11-08 DIAGNOSIS — E8770 Fluid overload, unspecified: Secondary | ICD-10-CM | POA: Diagnosis not present

## 2022-11-08 NOTE — Progress Notes (Signed)
Multidiscliplinary evaluation by cardiac surgery and cardiology today by phone with myself and  Dr. Percival Spanish.  Conclusion: Pericardial drainage not indicated

## 2022-11-08 NOTE — Progress Notes (Signed)
Patient said has pressure wound.  Checked skin  has on right buttock hardened area not onm boney area. Red small opening.  Smaller than a pen head towards center that is epithelize.  Coccyx area toward right feel bonny but patient reports sore. both hips on boney area sore.  Applying foam to all areas  no other additional open other than buttock area

## 2022-11-08 NOTE — Progress Notes (Signed)
   [facility] Progress Note   Subjective:  Seen in room - resting, but answering questions today. Denies CP/dyspnea. For HD tomorrow.  Objective Vitals:   11/07/22 0919 11/07/22 1810 11/07/22 2026 11/08/22 0453  BP: (!) 137/98 (!) 132/92 121/81 131/89  Pulse: (!) 120 100 (!) 110 (!) 106  Resp: 18 18 18 16   Temp:  98.6 F (37 C) 98.4 F (36.9 C) 97.7 F (36.5 C)  TempSrc:  Oral Oral Oral  SpO2: 100% 98% (!) 88% 90%  Weight:      Height:       Physical Exam General: Chronically ill appearing woman, NAD. Nasal O2 in place Heart: Tachycardic, 3/6 murmur Lungs: CTAB; no rales Abdomen: soft Extremities: BLE edema resolved Dialysis Access: LUE AVF + thrill/bruit  Additional Objective Labs: Basic Metabolic Panel: Recent Labs  Lab 11/02/22 0239  NA 139  K 4.7  CL 100  CO2 27  GLUCOSE 89  BUN 49*  CREATININE 8.93*  CALCIUM 9.0   CBC: Recent Labs  Lab 11/02/22 0239 11/06/22 0542 11/07/22 0055  WBC 9.7  --   --   HGB 8.5* 7.1* 8.1*  HCT 28.1* 23.6* 26.6*  MCV 100.4*  --   --   PLT 300  --   --    Medications:  [START ON 11/09/2022]  ceFAZolin (ANCEF) IV     ferric gluconate (FERRLECIT) IVPB 125 mg (11/06/22 1907)    sodium chloride   Intravenous Once   ARIPiprazole  7.5 mg Oral Daily   calcitRIOL  0.25 mcg Oral Q M,W,F-HD   calcium acetate  667 mg Oral TID WC   carvedilol  6.25 mg Oral BID WC   Chlorhexidine Gluconate Cloth  6 each Topical Q0600   darbepoetin (ARANESP) injection - DIALYSIS  100 mcg Subcutaneous Q Mon-1800   hydrALAZINE  50 mg Oral Q8H   isosorbide mononitrate  60 mg Oral Daily   mupirocin ointment  1 Application Nasal BID   sevelamer carbonate  1,600 mg Oral TID WC   torsemide  100 mg Oral q morning   traZODone  100 mg Oral QHS    Dialysis Orders: MWF at [place] 4hr, BFR 400, EDW 92kg -> down to 88.7kg here -> needs to go further, 2K/2Ca, LUE AVF - Mircera 251mcg Iv q 2 weeks   Assessment/Plan: ESRD: Usual MWF schedule.  Historically very non-compliant - better with hospitalization and incarceration. Next HD planned for Monday 12/11. Volume/ hypertension: with severe LVH. Losing weight in jail, will lower EDW and continue to challenge as tolerated. Adjusting meds per cardiology recommendations: d/c diltiazem, started low dose coreg - then increased, ^ iso/hydralazine. Remains on torsemide as well. Anemia of Chronic Kidney Disease: Hgb low. Given Aranesp 168mcg on 12/4. Recent 11/26 iron sat 6%, giving IV iron load here. Given 1U PRBCs 12/7. Hgb up to 8.1 now. Secondary Hyperparathyroidism/Hyperphosphatemia: Outpatient Phos 8.4. Continue home phoslo and calcitriol Vascular access: LUE AVF with no issues Chest pain: Trops high but stable - likely related to demand - cardiology following. Volume removal with HD as tolerated.  Plan for outpt nuc stress test.  Severe TR: plans to repeat TTE when euvolemic - cardiology following Pericardial effusion: Cardiology following - consideration for pericardial drain, but unclear if she will agree to this. Her effusion is likely related to chronic uremia and overload - both only recently improved.  Natasha Penton, PA-C 11/08/2022, 9:30 AM  [employer]

## 2022-11-08 NOTE — Progress Notes (Signed)
Progress Note  Patient Name: Natasha Long Date of Encounter: 11/08/2022  Primary Cardiologist:   Sinclair Grooms, MD   Subjective   She is complaining of pain at her left posterior iliac crest.  Some chest discomfort.    Inpatient Medications    Scheduled Meds:  sodium chloride   Intravenous Once   ARIPiprazole  7.5 mg Oral Daily   calcitRIOL  0.25 mcg Oral Q M,W,F-HD   calcium acetate  667 mg Oral TID WC   carvedilol  6.25 mg Oral BID WC   Chlorhexidine Gluconate Cloth  6 each Topical Q0600   darbepoetin (ARANESP) injection - DIALYSIS  100 mcg Subcutaneous Q Mon-1800   hydrALAZINE  50 mg Oral Q8H   isosorbide mononitrate  60 mg Oral Daily   mupirocin ointment  1 Application Nasal BID   sevelamer carbonate  1,600 mg Oral TID WC   torsemide  100 mg Oral q morning   traZODone  100 mg Oral QHS   Continuous Infusions:  [START ON 11/09/2022]  ceFAZolin (ANCEF) IV     ferric gluconate (FERRLECIT) IVPB 125 mg (11/06/22 1907)   PRN Meds: acetaminophen **OR** acetaminophen, dicyclomine, guaiFENesin, hydrALAZINE, hydrOXYzine, methocarbamol, ondansetron **OR** ondansetron (ZOFRAN) IV   Vital Signs    Vitals:   11/07/22 2026 11/08/22 0453 11/08/22 0948 11/08/22 1041  BP: 121/81 131/89 127/89   Pulse: (!) 110 (!) 106 (!) 117 (!) 106  Resp: 18 16 16    Temp: 98.4 F (36.9 C) 97.7 F (36.5 C) 98.2 F (36.8 C)   TempSrc: Oral Oral Oral   SpO2: (!) 88% 90% 100%   Weight:      Height:        Intake/Output Summary (Last 24 hours) at 11/08/2022 1107 Last data filed at 11/08/2022 1047 Gross per 24 hour  Intake 595 ml  Output 0 ml  Net 595 ml   Filed Weights   11/04/22 2015 11/06/22 1515 11/06/22 2043  Weight: 89.4 kg 96.3 kg 88.7 kg    Telemetry    NSR - Personally Reviewed  ECG    NA - Personally Reviewed  Physical Exam   GEN: No  acute distress.   Neck: No  JVD Cardiac: RRR, no murmurs, rubs, or gallops.  Respiratory: Clear   to auscultation  bilaterally. GI: Soft, nontender, non-distended, normal bowel sounds  MS:  No edema; No deformity. Neuro:   Nonfocal  Psych: Oriented and appropriate   Labs    Chemistry Recent Labs  Lab 11/02/22 0239  NA 139  K 4.7  CL 100  CO2 27  GLUCOSE 89  BUN 49*  CREATININE 8.93*  CALCIUM 9.0  GFRNONAA 6*  ANIONGAP 12     Hematology Recent Labs  Lab 11/02/22 0239 11/06/22 0542 11/07/22 0055  WBC 9.7  --   --   RBC 2.80*  --   --   HGB 8.5* 7.1* 8.1*  HCT 28.1* 23.6* 26.6*  MCV 100.4*  --   --   MCH 30.4  --   --   MCHC 30.2  --   --   RDW 19.0*  --   --   PLT 300  --   --     Cardiac EnzymesNo results for input(s): "TROPONINI" in the last 168 hours. No results for input(s): "TROPIPOC" in the last 168 hours.   BNP Recent Labs  Lab 11/02/22 0239  BNP 1,485.8*     DDimer No results for input(s): "DDIMER" in the  last 168 hours.   Radiology    No results found.  Cardiac Studies   ECHO:    1. Large pericardial effusion in the posterior portion of the left  ventricle with small-moderate pericardial effusion in the anterior/RV  side. There is no RV or RA collapse. IVC not well visualized. There is  excessive respiratory variation in the mitral   valve spectral Doppler velocities.   2. Left ventricular ejection fraction, by estimation, is 55 to 60%. The  left ventricle has normal function.   3. The mitral valve is normal in structure. not assessed mitral valve  regurgitation. not assesed mitral stenosis.   4. Tricuspid valve regurgitation not assessed. not assessed tricuspid  stenosis.   5. Aortic valve regurgitation not assessed. not assessed.    Patient Profile     26 y.o. female  with a hx of end-stage renal disease on dialysis, hypertension, borderline personality disorder, PTSD and CHF followed by Dr. Tamala Julian with cardiology who is being seen for the evaluation of chest pain at the request of Tennis Must, MD.   Assessment & Plan    Pericardial effusion:     She has been seen by [facility].  No evidence of tamponade.  Fluid will likely be responsive to appropriate dialysis.  I had a conversation that the risk of an open surgical chest procedure precludes this as an alternative to compliance with dialysis.  We can continue to follow the effusion and if it increased with appropriate dialysis we could consider pericardial window.   Acute on chronic HFmrEF:    Volume per dialysis.   Continue meds as listed  Hypertensive urgency:   BP improved.    Elevated troponin:  I don't suspect ACS.  Likely related to demand ischemia/hypertensive urgency.    MR/MI:   Seen by Dr. Tenny Craw.  I agree that she is not currently a candidate for valve surgery.  Risk with uncontrolled medical issues would be prohibitive both intra op and post op.      For questions or updates, please contact [place] Please consult [email or web address] for contact info under Cardiology/STEMI.   Signed, Minus Breeding, MD  11/08/2022, 11:07 AM

## 2022-11-08 NOTE — Progress Notes (Signed)
PROGRESS NOTE    Sheniah Supak  PXT:[id number] DOB: 12/24/1995 DOA: 11/01/2022 PCP: [name], MD   Brief Narrative:  This 26 yrs old female with PMH significant of asthma, hypertension, chronic combined systolic heart failure,, chronic non-specific chest pain, severe mitral regurgitation, pericardial effusion without cardiac tamponade, pleural effusion, focal glomerulosclerosis, ESRD on hemodialysis, secondary hyperparathyroidism, iron deficiency / ESRD anemia, class I obesity, borderline personality disorder, recurrent depression without psychotic features, polysubstance abuse (cannabis, cocaine, nicotine,  occasional alcohol use), PTSD, homelessness, insomnia, peripheral neuropathy, syphilis, septic arthritis of multiple joints who was recently admitted for acute respiratory failure in the setting of volume overload causing pulmonary edema, rhinovirus infection, hypertensive urgency and discharged to the local jail.  Patient again presents at this time with substernal chest pain for 2 days.  Not a good historian.  In the ED, patient had stable vitals including blood pressure.  Labs showed hemoglobin of 8.5 with elevated troponin at 365 and then 366 ng/L.  BNP was 1485.8 pg/mL.  BMP showed  BUN of 49 and creatinine of 8.93 mg/dL.  Chest x-ray showed moderate large heart silhouette with central vascular prominence without overt edema.  Moderate to large right and small left pleural effusions with overlying atelectasis or consolidations similar in appearance to 10/25/2022. Patient was then admitted hospital for further evaluation and treatment.   Assessment & Plan:   Principal Problem:   Volume overload Active Problems:   Primary hypertension   Elevated troponin   ESRD (end stage renal disease) on dialysis (HCC)   Pericardial effusion   Anemia in chronic kidney disease   Severe recurrent major depression without psychotic features (HCC)   Borderline personality disorder (HCC)   ESRD  (end stage renal disease) (HCC)   Moderate protein-calorie malnutrition (HCC)   Acute diastolic CHF (congestive heart failure) (HCC)   Mitral valve disease   ESRD on hemodialysis (HCC)   Chest pain  Volume overload: ESRD on hemodialysis: Acute on chronic combined systolic and diastolic heart failure: Patient presented with severe shortness of breath. Continue telemetry monitoring, oxygen supplementation, continue torsemide 100 mg daily.   Continue intake and output charting, daily weights.  No ACE inhibitor's or beta-blocker.   Cardiology and Nephrology has been consulted.  Patient has undergone hemodialysis at this time.   2D echo June 2023 with LV ejection fraction of 45 to 50%.  Coumadin on hold to CHF.   Plans to repeat 2D echocardiogram when volume status is euvolemic.   Counseled her regarding fluid restriction and compliance with dialysis. Volume management as per HD and diuresis.   Chronic chest pain, elevated troponin: EKG nonischemic.  Troponin elevated but not uptrending. Cardiology recommends outpatient follow-up with stress test and aggressive hemodialysis.   Essential hypertension: Continue Cardizem and torsemide.   Cardiology recommends Imdur 30 and hydralazine 25 mg 3 times daily as tolerated.   Continue Hydralazine 10 mg Iv q6hr as needed for SBP above 170 for better BP control.   Pericardial effusion without tamponade: Chest pain could be uremic pericarditis. Controlled. Echo shows pericardial effusion, seems consistent with uremic pericarditis. She may eventually require pericardial window if it does not get better with hemodialysis. Cardiothoracic surgery consulted. No evidence of tamponade on echo. Pericardial window not indicated as per Cardiothoracic surgery.  ESRD on hemodialysis: Nephrology on board.   Continue dialysis as per nephro .  Next dialysis 11/09/2022 Counseled her regarding need for full attendance with dialysis.   Anemia of chronic kidney  disease: Erythropoietin as per  nephrology. Hb 7.1, s/p 1 unit PRBC hemoglobin 8.1  Monitor H/H.   Major depression recurrent. Borderline personality disorder Continue Abilify, hydroxyzine, trazodone.   Moderate protein-calorie malnutrition ([place]) Nutrition has been consulted.   DVT prophylaxis: SCDs Code Status: Full code Family Communication: No family at bedside Disposition Plan:   Status is: Inpatient Remains inpatient appropriate because: To [name] likely in 1-2 days.   Consultants:  Cardiology Nephrology  Procedures:  Hemodialysis Antimicrobials:  None  Subjective: Patient was seen and examined at bedside.  Overnight events noted. Patient lying comfortably.  Continue to complain about shortness of breath. As per cardiothoracic surgery pericardial window not indicated.  Objective: Vitals:   11/07/22 2026 11/08/22 0453 11/08/22 0948 11/08/22 1041  BP: 121/81 131/89 127/89   Pulse: (!) 110 (!) 106 (!) 117 (!) 106  Resp: 18 16 16    Temp: 98.4 F (36.9 C) 97.7 F (36.5 C) 98.2 F (36.8 C)   TempSrc: Oral Oral Oral   SpO2: (!) 88% 90% 100%   Weight:      Height:        Intake/Output Summary (Last 24 hours) at 11/08/2022 1519 Last data filed at 11/08/2022 1230 Gross per 24 hour  Intake 480 ml  Output 0 ml  Net 480 ml   Filed Weights   11/04/22 2015 11/06/22 1515 11/06/22 2043  Weight: 89.4 kg 96.3 kg 88.7 kg    Examination:  General exam: Appears comfortable, not in any acute distress, deconditioned. Respiratory system: Decreased breath sounds, respiratory effort normal, RR 13. Cardiovascular system: S1-S2 heard, regular rate and rhythm, no murmur. Gastrointestinal system: Abdomen is soft, non tender, non distended, BS+ Central nervous system: Alert and oriented x 3, no focal neurological deficits. Extremities: Edema +, no cyanosis, no clubbing Skin: No rashes, lesions or ulcers Psychiatry: Judgement and insight appear normal. Mood &  affect appropriate.     Data Reviewed: I have personally reviewed following labs and imaging studies  CBC: Recent Labs  Lab 11/02/22 0239 11/06/22 0542 11/07/22 0055  WBC 9.7  --   --   HGB 8.5* 7.1* 8.1*  HCT 28.1* 23.6* 26.6*  MCV 100.4*  --   --   PLT 300  --   --    Basic Metabolic Panel: Recent Labs  Lab 11/02/22 0239  NA 139  K 4.7  CL 100  CO2 27  GLUCOSE 89  BUN 49*  CREATININE 8.93*  CALCIUM 9.0   GFR: Estimated Creatinine Clearance: 10.9 mL/min (A) (by C-G formula based on SCr of 8.93 mg/dL (H)). Liver Function Tests: No results for input(s): "AST", "ALT", "ALKPHOS", "BILITOT", "PROT", "ALBUMIN" in the last 168 hours. No results for input(s): "LIPASE", "AMYLASE" in the last 168 hours. No results for input(s): "AMMONIA" in the last 168 hours. Coagulation Profile: No results for input(s): "INR", "PROTIME" in the last 168 hours. Cardiac Enzymes: No results for input(s): "CKTOTAL", "CKMB", "CKMBINDEX", "TROPONINI" in the last 168 hours. BNP (last 3 results) No results for input(s): "PROBNP" in the last 8760 hours. HbA1C: No results for input(s): "HGBA1C" in the last 72 hours. CBG: No results for input(s): "GLUCAP" in the last 168 hours. Lipid Profile: No results for input(s): "CHOL", "HDL", "LDLCALC", "TRIG", "CHOLHDL", "LDLDIRECT" in the last 72 hours. Thyroid Function Tests: No results for input(s): "TSH", "T4TOTAL", "FREET4", "T3FREE", "THYROIDAB" in the last 72 hours. Anemia Panel: No results for input(s): "VITAMINB12", "FOLATE", "FERRITIN", "TIBC", "IRON", "RETICCTPCT" in the last 72 hours. Sepsis Labs: No results for input(s): "  PROCALCITON", "LATICACIDVEN" in the last 168 hours.  Recent Results (from the past 240 hour(s))  Resp Panel by RT-PCR (Flu A&B, Covid) Anterior Nasal Swab     Status: None   Collection Time: 11/02/22  4:33 AM   Specimen: Anterior Nasal Swab  Result Value Ref Range Status   SARS Coronavirus 2 by RT PCR NEGATIVE NEGATIVE  Final    Comment: (NOTE) SARS-CoV-2 target nucleic acids are NOT DETECTED.  The SARS-CoV-2 RNA is generally detectable in upper respiratory specimens during the acute phase of infection. The lowest concentration of SARS-CoV-2 viral copies this assay can detect is 138 copies/mL. A negative result does not preclude SARS-Cov-2 infection and should not be used as the sole basis for treatment or other patient management decisions. A negative result may occur with  improper specimen collection/handling, submission of specimen other than nasopharyngeal swab, presence of viral mutation(s) within the areas targeted by this assay, and inadequate number of viral copies(<138 copies/mL). A negative result must be combined with clinical observations, patient history, and epidemiological information. The expected result is Negative.  Fact Sheet for Patients:  EntrepreneurPulse.com.au  Fact Sheet for Healthcare Providers:  IncredibleEmployment.be  This test is no t yet approved or cleared by the [place] FDA and  has been authorized for detection and/or diagnosis of SARS-CoV-2 by FDA under an Emergency Use Authorization (EUA). This EUA will remain  in effect (meaning this test can be used) for the duration of the COVID-19 declaration under Section 564(b)(1) of the Act, 21 U.S.C.section 360bbb-3(b)(1), unless the authorization is terminated  or revoked sooner.       Influenza A by PCR NEGATIVE NEGATIVE Final   Influenza B by PCR NEGATIVE NEGATIVE Final    Comment: (NOTE) The Xpert Xpress SARS-CoV-2/FLU/RSV plus assay is intended as an aid in the diagnosis of influenza from Nasopharyngeal swab specimens and should not be used as a sole basis for treatment. Nasal washings and aspirates are unacceptable for Xpert Xpress SARS-CoV-2/FLU/RSV testing.  Fact Sheet for Patients: EntrepreneurPulse.com.au  Fact Sheet for Healthcare  Providers: IncredibleEmployment.be  This test is not yet approved or cleared by the [place] FDA and has been authorized for detection and/or diagnosis of SARS-CoV-2 by FDA under an Emergency Use Authorization (EUA). This EUA will remain in effect (meaning this test can be used) for the duration of the COVID-19 declaration under Section 564(b)(1) of the Act, 21 U.S.C. section 360bbb-3(b)(1), unless the authorization is terminated or revoked.  Performed at [facility], [place] [address] [place], [place] [address]   MRSA Next Gen by PCR, Nasal     Status: Abnormal   Collection Time: 11/05/22  5:37 PM   Specimen: Nasal Mucosa; Nasal Swab  Result Value Ref Range Status   MRSA by PCR Next Gen DETECTED (A) NOT DETECTED Final    Comment: RESULT CALLED TO, READ BACK BY AND VERIFIED WITH: RN GABBY BAUTISTA ON 11/05/22 @ 2007 BY DRT (NOTE) The GeneXpert MRSA Assay (FDA approved for NASAL specimens only), is one component of a comprehensive MRSA colonization surveillance program. It is not intended to diagnose MRSA infection nor to guide or monitor treatment for MRSA infections. Test performance is not FDA approved in patients less than 52 years old. Performed at [facility], [place] [address] [place], [place] [address]     Radiology Studies: No results found.  Scheduled Meds:  sodium chloride   Intravenous Once   ARIPiprazole  7.5 mg Oral Daily   calcitRIOL  0.25 mcg Oral Q M,W,F-HD   calcium acetate  667 mg Oral TID WC   carvedilol  6.25 mg Oral BID WC   Chlorhexidine Gluconate Cloth  6 each Topical Q0600   darbepoetin (ARANESP) injection - DIALYSIS  100 mcg Subcutaneous Q Mon-1800   hydrALAZINE  50 mg Oral Q8H   isosorbide mononitrate  60 mg Oral Daily   mupirocin ointment  1 Application Nasal BID   sevelamer carbonate  1,600 mg Oral TID WC   torsemide  100 mg Oral q morning   traZODone  100 mg Oral QHS   Continuous Infusions:   [START ON 11/09/2022]  ceFAZolin (ANCEF) IV     ferric gluconate (FERRLECIT) IVPB 125 mg (11/06/22 1907)     LOS: 5 days    Time spent: 35 mins    Dametria Tuzzolino, MD [facility]   If 7PM-7AM, please contact night-coverage

## 2022-11-09 ENCOUNTER — Other Ambulatory Visit: Payer: Self-pay | Admitting: Student

## 2022-11-09 ENCOUNTER — Encounter (HOSPITAL_COMMUNITY): Admission: EM | Payer: Self-pay | Attending: Family Medicine

## 2022-11-09 DIAGNOSIS — E8770 Fluid overload, unspecified: Secondary | ICD-10-CM | POA: Diagnosis not present

## 2022-11-09 DIAGNOSIS — I3139 Other pericardial effusion (noninflammatory): Secondary | ICD-10-CM | POA: Diagnosis not present

## 2022-11-09 LAB — BASIC METABOLIC PANEL
Anion gap: 14 (ref 5–15)
BUN: 59 mg/dL — ABNORMAL HIGH (ref 6–20)
CO2: 26 mmol/L (ref 22–32)
Calcium: 9.5 mg/dL (ref 8.9–10.3)
Chloride: 97 mmol/L — ABNORMAL LOW (ref 98–111)
Creatinine, Ser: 9.46 mg/dL — ABNORMAL HIGH (ref 0.44–1.00)
GFR, Estimated: 5 mL/min — ABNORMAL LOW (ref >60)
Glucose, Bld: 95 mg/dL (ref 70–99)
Potassium: 4.6 mmol/L (ref 3.5–5.1)
Sodium: 137 mmol/L (ref 135–145)

## 2022-11-09 LAB — CBC
HCT: 25.2 % — ABNORMAL LOW (ref 36.0–46.0)
Hemoglobin: 7.9 g/dL — ABNORMAL LOW (ref 12.0–15.0)
MCH: 29.6 pg (ref 26.0–34.0)
MCHC: 31.3 g/dL (ref 30.0–36.0)
MCV: 94.4 fL (ref 80.0–100.0)
Platelets: 362 10*3/uL (ref 150–400)
RBC: 2.67 MIL/uL — ABNORMAL LOW (ref 3.87–5.11)
RDW: 17.7 % — ABNORMAL HIGH (ref 11.5–15.5)
WBC: 8.7 10*3/uL (ref 4.0–10.5)
nRBC: 0 % (ref 0.0–0.2)

## 2022-11-09 LAB — MAGNESIUM: Magnesium: 2.2 mg/dL (ref 1.7–2.4)

## 2022-11-09 LAB — PHOSPHORUS: Phosphorus: 6 mg/dL — ABNORMAL HIGH (ref 2.5–4.6)

## 2022-11-09 SURGERY — CREATION, PERICARDIAL WINDOW, ANTERIOR THORACOTOMY APPROACH
Anesthesia: Choice

## 2022-11-09 NOTE — Progress Notes (Addendum)
Rounding Note    Patient Name: Natasha Long Date of Encounter: 11/09/2022  [place] Cardiologist: Belva Crome III, MD   Subjective   No acute overnight events. Patient is feeling better this morning. She continues to have a cough but pleuritic chest pain and shortness of breath have improved. Her only request this morning is that I ask our social worker to come. She would like to discuss ways to get "bailed out of jail."  Inpatient Medications    Scheduled Meds:  sodium chloride   Intravenous Once   ARIPiprazole  7.5 mg Oral Daily   calcitRIOL  0.25 mcg Oral Q M,W,F-HD   calcium acetate  667 mg Oral TID WC   carvedilol  6.25 mg Oral BID WC   Chlorhexidine Gluconate Cloth  6 each Topical Q0600   darbepoetin (ARANESP) injection - DIALYSIS  100 mcg Subcutaneous Q Mon-1800   hydrALAZINE  50 mg Oral Q8H   isosorbide mononitrate  60 mg Oral Daily   mupirocin ointment  1 Application Nasal BID   sevelamer carbonate  1,600 mg Oral TID WC   torsemide  100 mg Oral q morning   traZODone  100 mg Oral QHS   Continuous Infusions:   ceFAZolin (ANCEF) IV     ferric gluconate (FERRLECIT) IVPB 110 mL/hr at 11/08/22 1853   PRN Meds: acetaminophen **OR** acetaminophen, dicyclomine, guaiFENesin, hydrALAZINE, hydrOXYzine, methocarbamol, ondansetron **OR** ondansetron (ZOFRAN) IV   Vital Signs    Vitals:   11/08/22 1041 11/08/22 1545 11/08/22 2024 11/09/22 0609  BP:  128/85 120/78 125/83  Pulse: (!) 106 (!) 105 (!) 102 (!) 107  Resp:  18 18 18   Temp:  98.1 F (36.7 C) 98.6 F (37 C) 97.9 F (36.6 C)  TempSrc:  Oral Oral Oral  SpO2:  96% 95% 97%  Weight:      Height:        Intake/Output Summary (Last 24 hours) at 11/09/2022 0859 Last data filed at 11/09/2022 0600 Gross per 24 hour  Intake 600 ml  Output 0 ml  Net 600 ml      11/06/2022    8:43 PM 11/06/2022    3:15 PM 11/04/2022    8:15 PM  Last 3 Weights  Weight (lbs) 195 lb 8.8 oz 212 lb 4.9 oz 197 lb  1.5 oz  Weight (kg) 88.7 kg 96.3 kg 89.4 kg      Telemetry    Sinus tachycardia with rates in the low 100s. - Personally Reviewed  ECG    No new ECG since 11/01/2022. - Personally Reviewed  Physical Exam   GEN: 26 year old African-American female resting comfortably in no acute distress.   Neck: No JVD. Cardiac: Tachycardic with normal rhythm. II-III/VI murmur present. Radial pulses 2+ and equal bilaterally. Respiratory: No increased work of breathing. Decreased breath sounds in right base but otherwise lungs clear to auscultation. No wheezes, rhonchi, or rales. GI: Soft, non-distended, and non-tender. MS: No lower extremity edema. No deformity. Skin: Warm and dry. Neuro:  No focal deficits. Psych: Normal affects. Responds appropriately.  Labs    High Sensitivity Troponin:   Recent Labs  Lab 10/24/22 1713 10/25/22 0553 10/25/22 0700 11/02/22 0239 11/02/22 0424  TROPONINIHS 574* 646* 626* 365* 366*     Chemistry Recent Labs  Lab 11/09/22 0339  NA 137  K 4.6  CL 97*  CO2 26  GLUCOSE 95  BUN 59*  CREATININE 9.46*  CALCIUM 9.5  MG 2.2  GFRNONAA  5*  ANIONGAP 14    Lipids No results for input(s): "CHOL", "TRIG", "HDL", "LABVLDL", "LDLCALC", "CHOLHDL" in the last 168 hours.  Hematology Recent Labs  Lab 11/06/22 0542 11/07/22 0055 11/09/22 0339  WBC  --   --  8.7  RBC  --   --  2.67*  HGB 7.1* 8.1* 7.9*  HCT 23.6* 26.6* 25.2*  MCV  --   --  94.4  MCH  --   --  29.6  MCHC  --   --  31.3  RDW  --   --  17.7*  PLT  --   --  362   Thyroid No results for input(s): "TSH", "FREET4" in the last 168 hours.  BNPNo results for input(s): "BNP", "PROBNP" in the last 168 hours.  DDimer No results for input(s): "DDIMER" in the last 168 hours.   Radiology    No results found.  Cardiac Studies   Complete Echocardiogram 11/04/2022: Impressions: 1. Left ventricular ejection fraction, by estimation, is 55 to 60%. The  left ventricle has normal function. The left  ventricle has no regional  wall motion abnormalities. There is severe concentric left ventricular  hypertrophy. Left ventricular diastolic   parameters are indeterminate. Elevated left ventricular end-diastolic  pressure.   2. Right ventricular systolic function is normal. The right ventricular  size is mildly enlarged. There is moderately elevated pulmonary artery  systolic pressure. The estimated right ventricular systolic pressure is  32.6 mmHg.   3. The mitral valve is degenerative. Moderate mitral valve regurgitation.  Moderate mitral stenosis. The mean mitral valve gradient is 6.0 mmHg.  Moderate mitral annular calcification.   4. Tricuspid valve regurgitation is moderate to severe.   5. The aortic valve is tricuspid. Aortic valve regurgitation is trivial.  Moderate aortic valve stenosis. Aortic valve area, by VTI measures 1.37  cm. Aortic valve mean gradient measures 24.0 mmHg. Aortic valve Vmax  measures 3.42 m/s.   6. The inferior vena cava is dilated in size with <50% respiratory  variability, suggesting right atrial pressure of 15 mmHg.   7. Moderate to large circumferential pericardial effusion with greatest  diameter posterior to the LV measuring 3.02cm. There is no RV diastolic  collapse or RA inversion. There is excessive respiratory variation in the  mitral valve spectral Doppler  velocities. The IVC is dilated and plethoric.   8. Compared to prior study dated 10/25/2022, the pericardial effusion has  increased in size and now there is excessive respiratory variation in MV  doppler velocities but no RV diastolic colllpase or RA inversion. Clinical  correlation recommended.  _______________  Limited Echocardiogram 11/05/2022: Impressions:  1. Large pericardial effusion in the posterior portion of the left  ventricle with small-moderate pericardial effusion in the anterior/RV  side. There is no RV or RA collapse. IVC not well visualized. There is  excessive  respiratory variation in the mitral   valve spectral Doppler velocities.   2. Left ventricular ejection fraction, by estimation, is 55 to 60%. The  left ventricle has normal function.   3. The mitral valve is normal in structure. not assessed mitral valve  regurgitation. not assesed mitral stenosis.   4. Tricuspid valve regurgitation not assessed. not assessed tricuspid  stenosis.   5. Aortic valve regurgitation not assessed. not assessed.   Comparison(s): No significant change from prior study.    Patient Profile     26 y.o. 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 Patient has been admitted at least one a month since 04/2022 for a variety of complaints and frequently leaves AMA. She [place] [facility] [place] to 10/29/2022 for acute hypoxic respiratory failure secondary to acute pulmonary edema and hypertensive urgency in setting of non-compliance. Echo during that admission showed LVEF of 40-45% with global hypokinesis and grade 2 diastolic dysfunction as well as a moderate pericardial effusion that was stable from prior Echo in 07/2022. She underwent several days of dialysis and then was discharged on Torsemide. She presented back to the ED on 11/01/2022 after several days of pleuritic chest pain and shortness of breath. Repeat Echo showed increased pericardial effusion. Cardiology consulted for further evaluation.  Assessment & Plan    Chest Pain Pericardial Effusion Patient has had a known moderate pericardial effusion dating back to at least 07/2022 and presented with pleuritic chest pain and shortness of breath. High-sensitivity troponin mildly elevated and flat at 365 >> 366 not consistent with ACS.  Repeat Echo on admission showed LVEF of 55-60% with moderate to severe pericardial effusion that was increased in size from prior study in 10/25/2022. Repeat limited Echo on 12/7 showed a large pericardial effusion in the posterior portion of the LV with small-moderate pericardial effusion in the anterior/ RV side. She has a long history of non-compliance to medications and dialysis. CT surgery was consulted and she was seen by Dr. Tenny Craw and is not felt to need a pericardial window at this time. We are suspecting pericardial effusion to improve with dialysis.  - Pleuritic chest pain and shortness of breath have improved with dialysis. Continue dialysis per Nephrology. - Will discuss timing of repeat Echo with MD.  Acute on Chronic HFrEF LVEF as low as 40-45% on Echo in 09/2022 but improved to 55-60% on repeat Echo this admission. BNP elevated at 1,485 this admission (improved from >4,500 during admission last month). Chest x-ray showed central vascular prominence without overt edema and moderate right and small left pleural effusion. - Does not appear significantly volume overloaded on exam. - Volume status is being managed with hemodialysis. Also on Torsemide 100mg  daily. Will defer this to Nephrology. - Continue Coreg 6.25mg  twice daily. - Continue Hydralazine 50mg  three times daily and Imdur 60mg  daily. - No ACEi/ARB/ARNI, MRA, or SGLT2 inhibitor due to renal function.  Elevated Troponin High-sensitivity troponin elevated and flat at 365 >> 366. Echo showed normal LV function and no regional wall motion abnormalities. - Not consistent with ACS. Likely demand ischemia in setting of volume overload and ESRD. No plans for ischemic evaluation.  Moderate Aortic Stenosis Moderate Mitral Regurgitation/ Moderate Mitral Stenosis Moderate to Severe Tricuspid Regurgitation Noted on Echo this admission. Not felt to be a candidate for valve surgery at this time due to uncontrolled medical issues and  non-compliance. - Continue to manage conservatively with routine monitoring as an outpatient.  Hypertension BP well controlled. - Continue medications for CHF as above.  Social Concerns Patient asked that I consult our Social Worker to help her get "bailed out of jail." - Will place [facility] consult as request.  Otherwise, per primary team: - ESRD on hemodialysis - Acute on chronic anemia - Borderline personality disorder/ depression - Polysubstance abuse  For questions or updates, please contact [place] Please consult [email or web address] for contact info under  Signed, Darreld Mclean, PA-C  11/09/2022, 8:59 AM     History and all data above reviewed.  Patient examined.  I agree with the findings as above.  Denies pain or SOB. The patient exam reveals COR:RRR, no rub  ,  Lungs: Clear  ,  Abd: Positive bowel sounds, no rebound no guarding, Ext No edema  .  All available labs, radiology testing, previous records reviewed. Agree with documented assessment and plan.   Pericardial effusion:  Will continue with volume management via dialysis and reassess echo probably within a couple of weeks of discharge.    No clinical evidence of tamponade or pericarditis.  Jeneen Rinks Sol Englert  11:42 AM  11/09/2022

## 2022-11-09 NOTE — Progress Notes (Signed)
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  Patient again presents at this time with substernal chest pain for 2 days.  Not a good historian.  In the ED, patient had stable vitals including blood pressure.  Labs showed hemoglobin of 8.5 with elevated troponin at 365 and then 366 ng/L.  BNP was 1485.8 pg/mL.  BMP showed  BUN of 49 and creatinine of 8.93 mg/dL.  Chest x-ray showed moderate large heart silhouette with central vascular prominence without overt edema.  Moderate to large right and small left pleural effusions with overlying atelectasis or consolidations similar in appearance to 10/25/2022. Patient was then admitted hospital for further evaluation and treatment.   Assessment & Plan:   Principal Problem:   Volume overload Active Problems:   Primary hypertension   Elevated troponin   ESRD (end stage renal disease) on dialysis (HCC)   Pericardial effusion   Anemia in chronic kidney disease   Severe recurrent major depression without psychotic features (HCC)   Borderline personality disorder (HCC)   ESRD  (end stage renal disease) (HCC)   Moderate protein-calorie malnutrition (HCC)   Acute diastolic CHF (congestive heart failure) (HCC)   Mitral valve disease   ESRD on hemodialysis (HCC)   Chest pain  Volume overload: ESRD on hemodialysis: Acute on chronic combined systolic and diastolic heart failure: Patient presented with severe shortness of breath. Continue telemetry monitoring, oxygen supplementation, continue torsemide 100 mg daily.   Continue intake and output charting, daily weights.  No ACE inhibitor's or beta-blocker.   Cardiology and Nephrology has been consulted. Patient has undergone hemodialysis at this time.   2D echo June 2023 with LV ejection fraction of 45 to 50%.  Coumadin on hold to CHF.   Plans to repeat 2D echocardiogram when volume status is euvolemic.   Counseled her regarding fluid restriction and compliance with dialysis. Volume management as per HD and diuresis. Nephro signed off.    Chronic chest pain, elevated troponin: EKG nonischemic.  Troponin elevated but not uptrending. Cardiology recommends outpatient follow-up with stress test and  hemodialysis.   Essential hypertension: Continue Cardizem and torsemide.   Cardiology recommends Imdur 30 and hydralazine 25 mg 3 times daily as tolerated.   Continue Hydralazine 10 mg Iv q6hr as needed for SBP above 170 for better BP control.   Pericardial effusion without tamponade: Chest pain could be uremic pericarditis. Pain reasonably improved. Echo shows pericardial effusion, seems consistent with uremic pericarditis. She may eventually require pericardial window if it does not get better with hemodialysis. Cardiothoracic surgery consulted. No evidence of tamponade on echo. Pericardial window not indicated as per Cardiothoracic surgery. Repeat Echo in 2 weeks.  ESRD on hemodialysis: Nephrology on board.   Continue dialysis as per nephro .  Next dialysis 11/09/2022 Counseled her regarding need for full  attendance with dialysis.  Anemia of chronic kidney disease: Erythropoietin as per nephrology. Hb 7.1, s/p 1 unit PRBC hemoglobin 8.1  Monitor H/H.   Major depression recurrent. Borderline personality disorder Continue Abilify, hydroxyzine, trazodone.   Moderate protein-calorie malnutrition ([place]) Nutrition has been consulted.  Continue protein supplementations   DVT prophylaxis: SCDs Code Status: Full code Family Communication: No family at bedside Disposition Plan:   Status is: Inpatient Remains inpatient appropriate because: To [facility] likely in 1-2 days.   Consultants:  Cardiology Nephrology  Procedures:  Hemodialysis Antimicrobials:  None  Subjective: Patient was seen and examined at bedside.  Overnight events noted. Patient was sitting comfortably.  She is scheduled to have dialysis today.. As per cardiothoracic surgery pericardial window not indicated. She wants to talk to the Education officer, museum.  Objective: Vitals:   11/08/22 1545 11/08/22 2024 11/09/22 0609 11/09/22 0944  BP: 128/85 120/78 125/83 124/83  Pulse: (!) 105 (!) 102 (!) 107 (!) 103  Resp: 18 18 18    Temp: 98.1 F (36.7 C) 98.6 F (37 C) 97.9 F (36.6 C)   TempSrc: Oral Oral Oral   SpO2: 96% 95% 97%   Weight:      Height:        Intake/Output Summary (Last 24 hours) at 11/09/2022 1417 Last data filed at 11/09/2022 1311 Gross per 24 hour  Intake 720 ml  Output 0 ml  Net 720 ml   Filed Weights   11/04/22 2015 11/06/22 1515 11/06/22 2043  Weight: 89.4 kg 96.3 kg 88.7 kg    Examination:  General exam: Appears comfortable, not in any acute distress.  Deconditioned. Respiratory system: Decreased breath sounds, respiratory effort normal, RR 12. Cardiovascular system: S1-S2 heard, regular rate and rhythm, murmur+ Gastrointestinal system: Abdomen is soft, non tender, non distended, BS+ Central nervous system: Alert and oriented x 3, no focal neurological deficits. Extremities:  Edema +, no cyanosis, no clubbing Skin: No rashes, lesions or ulcers Psychiatry: Judgement and insight appear normal. Mood & affect appropriate.     Data Reviewed: I have personally reviewed following labs and imaging studies  CBC: Recent Labs  Lab 11/06/22 0542 11/07/22 0055 11/09/22 0339  WBC  --   --  8.7  HGB 7.1* 8.1* 7.9*  HCT 23.6* 26.6* 25.2*  MCV  --   --  94.4  PLT  --   --  825   Basic Metabolic Panel: Recent Labs  Lab 11/09/22 0339  NA 137  K 4.6  CL 97*  CO2 26  GLUCOSE 95  BUN 59*  CREATININE 9.46*  CALCIUM 9.5  MG 2.2  PHOS 6.0*   GFR: Estimated Creatinine Clearance: 10.3 mL/min (A) (by C-G formula based on SCr of 9.46 mg/dL (H)). Liver Function Tests: No results for input(s): "AST", "ALT", "ALKPHOS", "BILITOT", "PROT", "ALBUMIN" in the last 168 hours. No results for input(s): "LIPASE", "AMYLASE" in the last 168 hours. No results for input(s): "AMMONIA" in the last 168 hours. Coagulation Profile: No results for input(s): "INR", "PROTIME" in the last 168 hours. Cardiac Enzymes: No results for input(s): "CKTOTAL", "CKMB", "CKMBINDEX", "TROPONINI" in the last 168 hours. BNP (last 3 results) No results for input(s): "PROBNP" in the last 8760 hours. HbA1C: No results for input(s): "HGBA1C" in the last 72 hours. CBG: No results for input(s): "GLUCAP" in the last 168 hours. Lipid Profile: No results for input(s): "CHOL", "HDL", "LDLCALC", "TRIG", "CHOLHDL", "LDLDIRECT" in the last 72 hours. Thyroid Function Tests: No results for input(s): "TSH", "T4TOTAL", "FREET4", "T3FREE", "THYROIDAB" in the  last 72 hours. Anemia Panel: No results for input(s): "VITAMINB12", "FOLATE", "FERRITIN", "TIBC", "IRON", "RETICCTPCT" in the last 72 hours. Sepsis Labs: No results for input(s): "PROCALCITON", "LATICACIDVEN" in the last 168 hours.  Recent Results (from the past 240 hour(s))  Resp Panel by RT-PCR (Flu A&B, Covid) Anterior Nasal Swab     Status: None    Collection Time: 11/02/22  4:33 AM   Specimen: Anterior Nasal Swab  Result Value Ref Range Status   SARS Coronavirus 2 by RT PCR NEGATIVE NEGATIVE Final    Comment: (NOTE) SARS-CoV-2 target nucleic acids are NOT DETECTED.  The SARS-CoV-2 RNA is generally detectable in upper respiratory specimens during the acute phase of infection. The lowest concentration of SARS-CoV-2 viral copies this assay can detect is 138 copies/mL. A negative result does not preclude SARS-Cov-2 infection and should not be used as the sole basis for treatment or other patient management decisions. A negative result may occur with  improper specimen collection/handling, submission of specimen other than nasopharyngeal swab, presence of viral mutation(s) within the areas targeted by this assay, and inadequate number of viral copies(<138 copies/mL). A negative result must be combined with clinical observations, patient history, and epidemiological information. The expected result is Negative.  Fact Sheet for Patients:  EntrepreneurPulse.com.au  Fact Sheet for Healthcare Providers:  IncredibleEmployment.be  This test is no t yet approved or cleared by the [place] FDA and  has been authorized for detection and/or diagnosis of SARS-CoV-2 by FDA under an Emergency Use Authorization (EUA). This EUA will remain  in effect (meaning this test can be used) for the duration of the COVID-19 declaration under Section 564(b)(1) of the Act, 21 U.S.C.section 360bbb-3(b)(1), unless the authorization is terminated  or revoked sooner.       Influenza A by PCR NEGATIVE NEGATIVE Final   Influenza B by PCR NEGATIVE NEGATIVE Final    Comment: (NOTE) The Xpert Xpress SARS-CoV-2/FLU/RSV plus assay is intended as an aid in the diagnosis of influenza from Nasopharyngeal swab specimens and should not be used as a sole basis for treatment. Nasal washings and aspirates are unacceptable for  Xpert Xpress SARS-CoV-2/FLU/RSV testing.  Fact Sheet for Patients: EntrepreneurPulse.com.au  Fact Sheet for Healthcare Providers: IncredibleEmployment.be  This test is not yet approved or cleared by the [place] FDA and has been authorized for detection and/or diagnosis of SARS-CoV-2 by FDA under an Emergency Use Authorization (EUA). This EUA will remain in effect (meaning this test can be used) for the duration of the COVID-19 declaration under Section 564(b)(1) of the Act, 21 U.S.C. section 360bbb-3(b)(1), unless the authorization is terminated or revoked.  Performed at [facility], [place] [address], [place], [place] [address]   MRSA Next Gen by PCR, Nasal     Status: Abnormal   Collection Time: 11/05/22  5:37 PM   Specimen: Nasal Mucosa; Nasal Swab  Result Value Ref Range Status   MRSA by PCR Next Gen DETECTED (A) NOT DETECTED Final    Comment: RESULT CALLED TO, READ BACK BY AND VERIFIED WITH: RN GABBY BAUTISTA ON 11/05/22 @ 2007 BY DRT (NOTE) The GeneXpert MRSA Assay (FDA approved for NASAL specimens only), is one component of a comprehensive MRSA colonization surveillance program. It is not intended to diagnose MRSA infection nor to guide or monitor treatment for MRSA infections. Test performance is not FDA approved in patients less than 29 years old. Performed at [facility], [place] [address] [place], [place] [address]  Radiology Studies: No results found.  Scheduled Meds:  sodium chloride   Intravenous Once   ARIPiprazole  7.5 mg Oral Daily   calcitRIOL  0.25 mcg Oral Q M,W,F-HD   calcium acetate  667 mg Oral TID WC   carvedilol  6.25 mg Oral BID WC   Chlorhexidine Gluconate Cloth  6 each Topical Q0600   darbepoetin (ARANESP) injection - DIALYSIS  100 mcg Subcutaneous Q Mon-1800   hydrALAZINE  50 mg Oral Q8H   isosorbide mononitrate  60 mg Oral Daily   mupirocin ointment  1 Application Nasal BID    sevelamer carbonate  1,600 mg Oral TID WC   torsemide  100 mg Oral q morning   traZODone  100 mg Oral QHS   Continuous Infusions:   ceFAZolin (ANCEF) IV     ferric gluconate (FERRLECIT) IVPB 110 mL/hr at 11/08/22 1853     LOS: 6 days    Time spent: 23 mins    Peterson Mathey, MD [facility]   If 7PM-7AM, please contact night-coverage

## 2022-11-09 NOTE — Progress Notes (Signed)
Pt refused labs this am. Dr. Ree Kida.

## 2022-11-09 NOTE — Progress Notes (Signed)
[facility] Progress Note   Subjective:   Refused AM labs. HD later today. Sleeping but awakens to voice, says she is feeling well today. No SOB, CP, dizziness, abdominal pain or nausea.   Objective Vitals:   11/08/22 1041 11/08/22 1545 11/08/22 2024 11/09/22 0609  BP:  128/85 120/78 125/83  Pulse: (!) 106 (!) 105 (!) 102 (!) 107  Resp:  18 18 18   Temp:  98.1 F (36.7 C) 98.6 F (37 C) 97.9 F (36.6 C)  TempSrc:  Oral Oral Oral  SpO2:  96% 95% 97%  Weight:      Height:       Physical Exam General: Alert female in NAD, on room air Heart: RRR, 3/6 systolic murmur Lungs: CTA bilaterally, no wheezing,rhonchi or rales  Abdomen:Soft, non-distended, +BS Extremities: No edema b/l lower extremities Dialysis Access:  LUE AVF + bruit  Additional Objective Labs: Basic Metabolic Panel: Recent Labs  Lab 11/09/22 0339  NA 137  K 4.6  CL 97*  CO2 26  GLUCOSE 95  BUN 59*  CREATININE 9.46*  CALCIUM 9.5  PHOS 6.0*   Liver Function Tests: No results for input(s): "AST", "ALT", "ALKPHOS", "BILITOT", "PROT", "ALBUMIN" in the last 168 hours. No results for input(s): "LIPASE", "AMYLASE" in the last 168 hours. CBC: Recent Labs  Lab 11/06/22 0542 11/07/22 0055 11/09/22 0339  WBC  --   --  8.7  HGB 7.1* 8.1* 7.9*  HCT 23.6* 26.6* 25.2*  MCV  --   --  94.4  PLT  --   --  362   Blood Culture    Component Value Date/Time   SDES BLOOD SITE NOT SPECIFIED 10/25/2022 1111   SPECREQUEST  10/25/2022 1111    BOTTLES DRAWN AEROBIC AND ANAEROBIC Blood Culture adequate volume   CULT  10/25/2022 1111    NO GROWTH 5 DAYS Performed at [facility], [place] [address], [place], [place] [address]    REPTSTATUS 10/30/2022 FINAL 10/25/2022 1111    Cardiac Enzymes: No results for input(s): "CKTOTAL", "CKMB", "CKMBINDEX", "TROPONINI" in the last 168 hours. CBG: No results for input(s): "GLUCAP" in the last 168 hours. Iron Studies: No results for input(s): "IRON", "TIBC",  "TRANSFERRIN", "FERRITIN" in the last 72 hours. @lablastinr3 @ Studies/Results: No results found. Medications:   ceFAZolin (ANCEF) IV     ferric gluconate (FERRLECIT) IVPB 110 mL/hr at 11/08/22 1853    sodium chloride   Intravenous Once   ARIPiprazole  7.5 mg Oral Daily   calcitRIOL  0.25 mcg Oral Q M,W,F-HD   calcium acetate  667 mg Oral TID WC   carvedilol  6.25 mg Oral BID WC   Chlorhexidine Gluconate Cloth  6 each Topical Q0600   darbepoetin (ARANESP) injection - DIALYSIS  100 mcg Subcutaneous Q Mon-1800   hydrALAZINE  50 mg Oral Q8H   isosorbide mononitrate  60 mg Oral Daily   mupirocin ointment  1 Application Nasal BID   sevelamer carbonate  1,600 mg Oral TID WC   torsemide  100 mg Oral q morning   traZODone  100 mg Oral QHS    Dialysis Orders: MWF at [facility] 4hr, BFR 400, EDW 92kg -> down to 88.7kg here -> needs to go further, 2K/2Ca, LUE AVF - Mircera 259mcg Iv q 2 weeks    Assessment/Plan: ESRD: Usual MWF schedule. Historically very non-compliant - better with hospitalization and incarceration. Next HD planned for Monday 12/11. Volume/ hypertension: with severe LVH. Losing weight in jail, will lower EDW and  continue to challenge as tolerated. Adjusting meds per cardiology recommendations: d/c diltiazem, started low dose coreg - then increased, ^ iso/hydralazine. Remains on torsemide as well. Anemia of Chronic Kidney Disease: Hgb low. Given Aranesp 111mcg on 12/4. Recent 11/26 iron sat 6%, giving IV iron load here. Given 1U PRBCs 12/7. Hgb up to 7.9 now. Secondary Hyperparathyroidism/ Hyperphosphatemia: Outpatient Phos 8.4, improved to 6.0 here Continue home phoslo and calcitriol Vascular access: LUE AVF with no issues Chest pain: Trops high but stable - likely related to demand - cardiology following. Volume removal with HD as tolerated.  Plan for outpatient stress test.  Severe TR: cardiology reports she is not a candidate for valve surgery Pericardial effusion: Cardiology  following - consideration for pericardial drain, but unclear if she will agree to this and per cardiology not indicated at this time. Her effusion is likely related to chronic uremia and overload - both only recently improved.  Anice Paganini, PA-C 11/09/2022, 9:36 AM  [facility] Pager: [phone]

## 2022-11-09 NOTE — Progress Notes (Signed)
   11/09/22 1844  Vitals  Temp 98.5 F (36.9 C)  Temp Source Oral  BP 109/60  MAP (mmHg) 75  BP Location Right Arm  BP Method Automatic  Patient Position (if appropriate) Lying  Pulse Rate (!) 101  Pulse Rate Source Monitor  ECG Heart Rate (!) 105  Resp (!) 27  Oxygen Therapy  SpO2 95 %  O2 Device Simple Mask  O2 Flow Rate (L/min) 4 L/min  Pulse Oximetry Type Continuous   Received patient in bed to unit.  Alert and oriented.  Informed consent signed and in chart.   Treatment initiated: 1506 Treatment completed: 1828  Patient tolerated well.  Transported back to the room  Alert, without acute distress.  Hand-off given to patient's nurse.   Access used: AVF Access issues: NA  Total UF removed: 2200 ml Medication(s) given: Ferric Gluconate 125 mg IVPB, Attarax 25 mg po, Guaifensesin 100mg /28ml  Post HD VS: see above  Post HD weight: 83.8kg   [facility]

## 2022-11-10 DIAGNOSIS — I3139 Other pericardial effusion (noninflammatory): Secondary | ICD-10-CM

## 2022-11-10 DIAGNOSIS — E8770 Fluid overload, unspecified: Secondary | ICD-10-CM | POA: Diagnosis not present

## 2022-11-10 MED ORDER — CHLORHEXIDINE GLUCONATE CLOTH 2 % EX PADS
6.0000 | MEDICATED_PAD | Freq: Every day | CUTANEOUS | Status: DC
  Administered 2022-11-10: 6 via TOPICAL

## 2022-11-10 NOTE — Progress Notes (Signed)
PROGRESS NOTE    Natasha Long  HBZ:[id number] DOB: January 12, 1996 DOA: 11/01/2022 PCP: [name], MD   Brief Narrative:  This 26 yrs old female with PMH significant of asthma, hypertension, chronic combined systolic heart failure, chronic non-specific chest pain, severe mitral regurgitation, pericardial effusion without cardiac tamponade, pleural effusion, focal glomerulosclerosis, ESRD on hemodialysis, secondary hyperparathyroidism, iron deficiency / ESRD anemia, class I obesity, borderline personality disorder, recurrent depression without psychotic features, polysubstance abuse (cannabis, cocaine, nicotine,  occasional alcohol use), PTSD, homelessness, insomnia, peripheral neuropathy, syphilis, septic arthritis of multiple joints who was recently admitted for acute respiratory failure in the setting of volume overload causing pulmonary edema, rhinovirus infection, hypertensive urgency and discharged to the local jail. Patient again presents at this time with substernal chest pain for 2 days.  Not a good historian.  In the ED, patient had stable vitals including blood pressure.  Labs showed hemoglobin of 8.5 with elevated troponin at 365 and then 366 ng/L.  BNP was 1485.8 pg/mL.  BMP showed  BUN of 49 and creatinine of 8.93 mg/dL.  Chest x-ray showed moderate large heart silhouette with central vascular prominence without overt edema.  Moderate to large right and small left pleural effusions with overlying atelectasis or consolidations similar in appearance to 10/25/2022. Patient was then admitted hospital for further evaluation and treatment.   Assessment & Plan:   Principal Problem:   Volume overload Active Problems:   Primary hypertension   Elevated troponin   ESRD (end stage renal disease) on dialysis (HCC)   Pericardial effusion   Anemia in chronic kidney disease   Severe recurrent major depression without psychotic features (HCC)   Borderline personality disorder (HCC)   ESRD  (end stage renal disease) (HCC)   Moderate protein-calorie malnutrition (HCC)   Acute diastolic CHF (congestive heart failure) (HCC)   Mitral valve disease   ESRD on hemodialysis (HCC)   Chest pain  Volume overload: ESRD on hemodialysis: Acute on chronic combined systolic and diastolic heart failure: Patient presented with severe shortness of breath. Continue telemetry monitoring, oxygen supplementation, Continue torsemide 100 mg daily.   Continue intake and output charting, daily weights.  No ACE inhibitor's or beta-blocker.   Cardiology and Nephrology has been consulted. Patient has undergone hemodialysis at this time.   2D echo June 2023 with LV ejection fraction of 45 to 50%.  Plans to repeat 2D echocardiogram when volume status is euvolemic.   Counseled her regarding fluid restriction and compliance with dialysis. Volume management as per HD and diuresis. Nephro signed off.    Chronic chest pain, elevated troponin: EKG nonischemic.  Troponin elevated but not uptrending. Cardiology recommends outpatient follow-up with stress test and  hemodialysis.   Essential hypertension: Continue Cardizem and torsemide.   Cardiology recommends Imdur 30 and hydralazine 25 mg 3 times daily as tolerated.   Continue Hydralazine 10 mg Iv q6hr as needed for SBP above 170 for better BP control.   Pericardial effusion without tamponade: Chest pain could be uremic pericarditis. Pain reasonably improved. Echo shows pericardial effusion, seems consistent with uremic pericarditis. She may eventually require pericardial window if it does not get better with hemodialysis. Cardiothoracic surgery consulted. No evidence of tamponade on echo. Pericardial window not indicated as per Cardiothoracic surgery. Repeat Echo in 2 weeks.  ESRD on hemodialysis: Nephrology on board.   Continue dialysis as per nephro .  Next dialysis 11/11/2022 Counseled her regarding need for full attendance with dialysis.    Anemia of chronic kidney disease: Erythropoietin  as per nephrology. Hb 7.1, s/p 1 unit PRBC hemoglobin 8.1  Monitor H/H.   Major depression recurrent. Borderline personality disorder Continue Abilify, hydroxyzine, trazodone.   Moderate protein-calorie malnutrition ([place]) Nutrition has been consulted.  Continue protein supplementations   DVT prophylaxis: SCDs Code Status: Full code Family Communication: No family at bedside Disposition Plan:   Status is: Inpatient Remains inpatient appropriate because: To [facility] likely in 1-2 days.   Consultants:  Cardiology Nephrology  Procedures:  Hemodialysis Antimicrobials:  None  Subjective: Patient was seen and examined at bedside.  Overnight events noted. Patient was lying comfortably.  She signed off earlier in dialysis yesterday. As per cardiothoracic surgery pericardial window not indicated. She wants to talk to the Education officer, museum.  She spiked fever yesterday 100.9.  Objective: Vitals:   11/09/22 1844 11/09/22 2100 11/10/22 0639 11/10/22 1007  BP: 109/60  127/85 137/87  Pulse: (!) 101 (!) 110 (!) 109 (!) 112  Resp: (!) 27 18 18    Temp: 98.5 F (36.9 C) (!) 100.8 F (38.2 C) 98.7 F (37.1 C) (!) 97.5 F (36.4 C)  TempSrc: Oral Oral Oral Oral  SpO2: 95%  94% 95%  Weight: 83.8 kg     Height:        Intake/Output Summary (Last 24 hours) at 11/10/2022 1444 Last data filed at 11/09/2022 2200 Gross per 24 hour  Intake 340 ml  Output 2200 ml  Net -1860 ml   Filed Weights   11/06/22 2043 11/09/22 1437 11/09/22 1844  Weight: 88.7 kg 87.4 kg 83.8 kg    Examination:  General exam: Appears comfortable, not in any acute distress.  Deconditioned Respiratory system: Decreased breath sounds, respiratory effort normal, RR 13. Cardiovascular system: S1-S2 heard, regular rate and rhythm, murmur+ Gastrointestinal system: Abdomen is soft, non tender, non distended, BS+ Central nervous system: Alert and oriented x  3, no focal neurological deficits. Extremities: Edema+, no cyanosis, no clubbing Skin: No rashes, lesions or ulcers Psychiatry: Judgement and insight appear normal. Mood & affect appropriate.     Data Reviewed: I have personally reviewed following labs and imaging studies  CBC: Recent Labs  Lab 11/06/22 0542 11/07/22 0055 11/09/22 0339  WBC  --   --  8.7  HGB 7.1* 8.1* 7.9*  HCT 23.6* 26.6* 25.2*  MCV  --   --  94.4  PLT  --   --  875   Basic Metabolic Panel: Recent Labs  Lab 11/09/22 0339  NA 137  K 4.6  CL 97*  CO2 26  GLUCOSE 95  BUN 59*  CREATININE 9.46*  CALCIUM 9.5  MG 2.2  PHOS 6.0*   GFR: Estimated Creatinine Clearance: 10 mL/min (A) (by C-G formula based on SCr of 9.46 mg/dL (H)). Liver Function Tests: No results for input(s): "AST", "ALT", "ALKPHOS", "BILITOT", "PROT", "ALBUMIN" in the last 168 hours. No results for input(s): "LIPASE", "AMYLASE" in the last 168 hours. No results for input(s): "AMMONIA" in the last 168 hours. Coagulation Profile: No results for input(s): "INR", "PROTIME" in the last 168 hours. Cardiac Enzymes: No results for input(s): "CKTOTAL", "CKMB", "CKMBINDEX", "TROPONINI" in the last 168 hours. BNP (last 3 results) No results for input(s): "PROBNP" in the last 8760 hours. HbA1C: No results for input(s): "HGBA1C" in the last 72 hours. CBG: No results for input(s): "GLUCAP" in the last 168 hours. Lipid Profile: No results for input(s): "CHOL", "HDL", "LDLCALC", "TRIG", "CHOLHDL", "LDLDIRECT" in the last 72 hours. Thyroid Function Tests: No results for input(s): "TSH", "  T4TOTAL", "FREET4", "T3FREE", "THYROIDAB" in the last 72 hours. Anemia Panel: No results for input(s): "VITAMINB12", "FOLATE", "FERRITIN", "TIBC", "IRON", "RETICCTPCT" in the last 72 hours. Sepsis Labs: No results for input(s): "PROCALCITON", "LATICACIDVEN" in the last 168 hours.  Recent Results (from the past 240 hour(s))  Resp Panel by RT-PCR (Flu A&B, Covid)  Anterior Nasal Swab     Status: None   Collection Time: 11/02/22  4:33 AM   Specimen: Anterior Nasal Swab  Result Value Ref Range Status   SARS Coronavirus 2 by RT PCR NEGATIVE NEGATIVE Final    Comment: (NOTE) SARS-CoV-2 target nucleic acids are NOT DETECTED.  The SARS-CoV-2 RNA is generally detectable in upper respiratory specimens during the acute phase of infection. The lowest concentration of SARS-CoV-2 viral copies this assay can detect is 138 copies/mL. A negative result does not preclude SARS-Cov-2 infection and should not be used as the sole basis for treatment or other patient management decisions. A negative result may occur with  improper specimen collection/handling, submission of specimen other than nasopharyngeal swab, presence of viral mutation(s) within the areas targeted by this assay, and inadequate number of viral copies(<138 copies/mL). A negative result must be combined with clinical observations, patient history, and epidemiological information. The expected result is Negative.  Fact Sheet for Patients:  EntrepreneurPulse.com.au  Fact Sheet for Healthcare Providers:  IncredibleEmployment.be  This test is no t yet approved or cleared by the [place] FDA and  has been authorized for detection and/or diagnosis of SARS-CoV-2 by FDA under an Emergency Use Authorization (EUA). This EUA will remain  in effect (meaning this test can be used) for the duration of the COVID-19 declaration under Section 564(b)(1) of the Act, 21 U.S.C.section 360bbb-3(b)(1), unless the authorization is terminated  or revoked sooner.       Influenza A by PCR NEGATIVE NEGATIVE Final   Influenza B by PCR NEGATIVE NEGATIVE Final    Comment: (NOTE) The Xpert Xpress SARS-CoV-2/FLU/RSV plus assay is intended as an aid in the diagnosis of influenza from Nasopharyngeal swab specimens and should not be used as a sole basis for treatment. Nasal washings  and aspirates are unacceptable for Xpert Xpress SARS-CoV-2/FLU/RSV testing.  Fact Sheet for Patients: EntrepreneurPulse.com.au  Fact Sheet for Healthcare Providers: IncredibleEmployment.be  This test is not yet approved or cleared by the [place] FDA and has been authorized for detection and/or diagnosis of SARS-CoV-2 by FDA under an Emergency Use Authorization (EUA). This EUA will remain in effect (meaning this test can be used) for the duration of the COVID-19 declaration under Section 564(b)(1) of the Act, 21 U.S.C. section 360bbb-3(b)(1), unless the authorization is terminated or revoked.  Performed at [facility], [place] [address] [place], [place] [address]   MRSA Next Gen by PCR, Nasal     Status: Abnormal   Collection Time: 11/05/22  5:37 PM   Specimen: Nasal Mucosa; Nasal Swab  Result Value Ref Range Status   MRSA by PCR Next Gen DETECTED (A) NOT DETECTED Final    Comment: RESULT CALLED TO, READ BACK BY AND VERIFIED WITH: RN GABBY BAUTISTA ON 11/05/22 @ 2007 BY DRT (NOTE) The GeneXpert MRSA Assay (FDA approved for NASAL specimens only), is one component of a comprehensive MRSA colonization surveillance program. It is not intended to diagnose MRSA infection nor to guide or monitor treatment for MRSA infections. Test performance is not FDA approved in patients less than 33 years old. Performed at [facility], [place],  [place] [address]     Radiology Studies: No results found.  Scheduled Meds:  ARIPiprazole  7.5 mg Oral Daily   calcitRIOL  0.25 mcg Oral Q M,W,F-HD   calcium acetate  667 mg Oral TID WC   carvedilol  6.25 mg Oral BID WC   Chlorhexidine Gluconate Cloth  6 each Topical Q0600   Chlorhexidine Gluconate Cloth  6 each Topical Q0600   darbepoetin (ARANESP) injection - DIALYSIS  100 mcg Subcutaneous Q Mon-1800   hydrALAZINE  50 mg Oral Q8H   isosorbide mononitrate  60 mg Oral  Daily   mupirocin ointment  1 Application Nasal BID   sevelamer carbonate  1,600 mg Oral TID WC   torsemide  100 mg Oral q morning   traZODone  100 mg Oral QHS   Continuous Infusions:  ferric gluconate (FERRLECIT) IVPB 125 mg (11/09/22 1715)     LOS: 7 days    Time spent: 35 mins    Keera Altidor, MD [facility]   If 7PM-7AM, please contact night-coverage

## 2022-11-10 NOTE — Progress Notes (Signed)
   No new recommendations from yesterday. Patient was seen by CT surgery and cardiac window not felt to be necessary. Will repeat Echo in a couple of weeks to monitor pericardial effusion and then see patient for follow-up after this. Volume status is managed via hemodialysis. She is also on Torsemide which we will defer to Nephrology. Continue current GDMT: Coreg 6.25mg  twice daily, Hydralazine 50mg  three times daily, and Imdur 60mg  daily. If additional heart rate control is needed and BP will allow, could increase Coreg to 12.5mg  twice daily.  Stuckey will sign off.   Medication Recommendations:  As above. Other recommendations (labs, testing, etc):  Repeat limited Echo scheduled for 11/24/2022. Follow up as an outpatient:  Follow-up visit arranged on 11/27/2022 wiith Almyra Deforest, PA-C.  Darreld Mclean, PA-C 11/10/2022 10:06 AM

## 2022-11-10 NOTE — Progress Notes (Signed)
CSW met with patient as requested, Jail guard at bedside as well. Patient asked me to call her emergency contact, Marthann Schiller, so she can ask him to pay her bond money ($[phone]) or to call a bail bondsman as she is not allowed to use the phone. CSW alerted her that permission would have to be granted from the jail to do so. Guard stated CSW would have to contact Nelda Marseille [phone]) to ask permission. CSW contacted Nelda Marseille and he stated that the patient is to be kept confidential and it would be considered a security breach to contact anyone for the patient. He stated if an emergency situation comes up, their Neil Crouch would have to assess the situation but as it is not currently an emergency, he stated the patient will be able to call anyone she wants once she arrives back to jail.   Gilmore Laroche, MSW, [facility]

## 2022-11-10 NOTE — Progress Notes (Signed)
[facility] Progress Note   Subjective:   Signed off HD approximately 1 hour early yesterday. Nursing reports she refused AM meds. Refused exam today and would not answer ROS questions, using profanity and says I am annoying her.  Objective Vitals:   11/09/22 1844 11/09/22 2100 11/10/22 0639 11/10/22 1007  BP: 109/60  127/85 137/87  Pulse: (!) 101 (!) 110 (!) 109 (!) 112  Resp: (!) 27 18 18    Temp: 98.5 F (36.9 C) (!) 100.8 F (38.2 C) 98.7 F (37.1 C) (!) 97.5 F (36.4 C)  TempSrc: Oral Oral Oral Oral  SpO2: 95%  94% 95%  Weight: 83.8 kg     Height:       Physical Exam General: Alert female, on simple O2 mask, NAD Refused exam  Additional Objective Labs: Basic Metabolic Panel: Recent Labs  Lab 11/09/22 0339  NA 137  K 4.6  CL 97*  CO2 26  GLUCOSE 95  BUN 59*  CREATININE 9.46*  CALCIUM 9.5  PHOS 6.0*   Liver Function Tests: No results for input(s): "AST", "ALT", "ALKPHOS", "BILITOT", "PROT", "ALBUMIN" in the last 168 hours. No results for input(s): "LIPASE", "AMYLASE" in the last 168 hours. CBC: Recent Labs  Lab 11/06/22 0542 11/07/22 0055 11/09/22 0339  WBC  --   --  8.7  HGB 7.1* 8.1* 7.9*  HCT 23.6* 26.6* 25.2*  MCV  --   --  94.4  PLT  --   --  362   Blood Culture    Component Value Date/Time   SDES BLOOD SITE NOT SPECIFIED 10/25/2022 1111   SPECREQUEST  10/25/2022 1111    BOTTLES DRAWN AEROBIC AND ANAEROBIC Blood Culture adequate volume   CULT  10/25/2022 1111    NO GROWTH 5 DAYS Performed at [facility], [place] [address], [place], [place] [address]    REPTSTATUS 10/30/2022 FINAL 10/25/2022 1111    Cardiac Enzymes: No results for input(s): "CKTOTAL", "CKMB", "CKMBINDEX", "TROPONINI" in the last 168 hours. CBG: No results for input(s): "GLUCAP" in the last 168 hours. Iron Studies: No results for input(s): "IRON", "TIBC", "TRANSFERRIN", "FERRITIN" in the last 72 hours. @lablastinr3 @ Studies/Results: No results  found. Medications:   ceFAZolin (ANCEF) IV     ferric gluconate (FERRLECIT) IVPB 125 mg (11/09/22 1715)    ARIPiprazole  7.5 mg Oral Daily   calcitRIOL  0.25 mcg Oral Q M,W,F-HD   calcium acetate  667 mg Oral TID WC   carvedilol  6.25 mg Oral BID WC   Chlorhexidine Gluconate Cloth  6 each Topical Q0600   darbepoetin (ARANESP) injection - DIALYSIS  100 mcg Subcutaneous Q Mon-1800   hydrALAZINE  50 mg Oral Q8H   isosorbide mononitrate  60 mg Oral Daily   mupirocin ointment  1 Application Nasal BID   sevelamer carbonate  1,600 mg Oral TID WC   torsemide  100 mg Oral q morning   traZODone  100 mg Oral QHS    Outpatient Dialysis Orders: MWF at [facility] 4hr, BFR 400, EDW 92kg -> down to 88.7kg here -> needs to go further, 2K/2Ca, LUE AVF - Mircera 282mcg Iv q 2 weeks    Assessment/Plan: ESRD: Usual MWF schedule. Historically very non-compliant - better with hospitalization and incarceration. Next HD planned for Wednesday Volume/ hypertension: with severe LVH. Losing weight in jail, will lower EDW and continue to challenge as tolerated. Adjusting meds per cardiology recommendations: d/c diltiazem, started low dose coreg - then increased, ^ iso/hydralazine. Remains on torsemide  as well. Anemia of Chronic Kidney Disease: Hgb low. Given Aranesp 153mcg on 12/4. Recent 11/26 iron sat 6%, giving IV iron load here. Given 1U PRBCs 12/7. Hgb up to 7.9 now. Secondary Hyperparathyroidism/ Hyperphosphatemia: Outpatient Phos 8.4, improved to 6.0 here Continue home phoslo and calcitriol Vascular access: LUE AVF with no issues Chest pain: Trops high but stable - likely related to demand - cardiology following. Volume removal with HD as tolerated.  Plan for outpatient stress test.  Severe TR: cardiology reports she is not a candidate for valve surgery Pericardial effusion: Cardiology following - consideration for pericardial drain, but unclear if she will agree to this and per cardiology not indicated at this  time. Her effusion is likely related to chronic uremia and overload - both only recently improved.   Anice Paganini, PA-C 11/10/2022, 11:10 AM  [facility] Pager: [phone]

## 2022-11-11 ENCOUNTER — Other Ambulatory Visit (HOSPITAL_COMMUNITY): Payer: Self-pay

## 2022-11-11 DIAGNOSIS — N186 End stage renal disease: Secondary | ICD-10-CM | POA: Diagnosis not present

## 2022-11-11 DIAGNOSIS — I3139 Other pericardial effusion (noninflammatory): Secondary | ICD-10-CM | POA: Diagnosis not present

## 2022-11-11 DIAGNOSIS — I5031 Acute diastolic (congestive) heart failure: Secondary | ICD-10-CM | POA: Diagnosis not present

## 2022-11-11 DIAGNOSIS — I10 Essential (primary) hypertension: Secondary | ICD-10-CM | POA: Diagnosis not present

## 2022-11-11 LAB — CBC
HCT: 25.3 % — ABNORMAL LOW (ref 36.0–46.0)
Hemoglobin: 7.6 g/dL — ABNORMAL LOW (ref 12.0–15.0)
MCH: 28.4 pg (ref 26.0–34.0)
MCHC: 30 g/dL (ref 30.0–36.0)
MCV: 94.4 fL (ref 80.0–100.0)
Platelets: 378 10*3/uL (ref 150–400)
RBC: 2.68 MIL/uL — ABNORMAL LOW (ref 3.87–5.11)
RDW: 17.4 % — ABNORMAL HIGH (ref 11.5–15.5)
WBC: 7.3 10*3/uL (ref 4.0–10.5)
nRBC: 0 % (ref 0.0–0.2)

## 2022-11-11 LAB — RENAL FUNCTION PANEL
Albumin: 2.2 g/dL — ABNORMAL LOW (ref 3.5–5.0)
Anion gap: 15 (ref 5–15)
BUN: 53 mg/dL — ABNORMAL HIGH (ref 6–20)
CO2: 26 mmol/L (ref 22–32)
Calcium: 9.6 mg/dL (ref 8.9–10.3)
Chloride: 98 mmol/L (ref 98–111)
Creatinine, Ser: 8.84 mg/dL — ABNORMAL HIGH (ref 0.44–1.00)
GFR, Estimated: 6 mL/min — ABNORMAL LOW (ref >60)
Glucose, Bld: 91 mg/dL (ref 70–99)
Phosphorus: 5.6 mg/dL — ABNORMAL HIGH (ref 2.5–4.6)
Potassium: 4.4 mmol/L (ref 3.5–5.1)
Sodium: 139 mmol/L (ref 135–145)

## 2022-11-11 MED ORDER — ISOSORBIDE MONONITRATE ER 60 MG PO TB24
60.0000 mg | ORAL_TABLET | Freq: Every day | ORAL | 0 refills | Status: DC
  Filled 2022-11-11: qty 30, 30d supply, fill #0

## 2022-11-11 MED ORDER — HYDRALAZINE HCL 50 MG PO TABS
50.0000 mg | ORAL_TABLET | Freq: Three times a day (TID) | ORAL | 0 refills | Status: DC
  Filled 2022-11-11: qty 90, 30d supply, fill #0

## 2022-11-11 MED ORDER — CARVEDILOL 6.25 MG PO TABS
6.2500 mg | ORAL_TABLET | Freq: Two times a day (BID) | ORAL | 0 refills | Status: DC
  Filled 2022-11-11: qty 60, 30d supply, fill #0

## 2022-11-11 MED ORDER — ACETAMINOPHEN 325 MG PO TABS: 650.0000 mg | ORAL_TABLET | Freq: Four times a day (QID) | ORAL | Status: DC | PRN

## 2022-11-11 NOTE — Assessment & Plan Note (Signed)
Patient with preserved LV systolic function and large pericardial effusion with no tamponade physiology.   Volume status has improved with ultrafiltration. At the time of  her discharge her volume balance is negative -8,737 ml with improvement with her symptoms.   Plan to continue torsemide daily along with hemodialysis Monday, Wednesday and Friday.  Afterload reduction with hydralazine and isosorbide.  B blockade with carvedilol.

## 2022-11-11 NOTE — Assessment & Plan Note (Signed)
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  Echocardiogram with large pericardial effusion in the posterior portion of the left ventricle with small moderate pericardial effusion in the anterior/ RV side, no RV or RA collapse. LV systolic function 55 to 38%. (No significant change from prior echocardiogram).   Cardiology and cardiothoracic consulted, multidisciplinary evaluation concluded that no pericardial drainage is indicated at this point in time.  Continue ultrafiltration with HD and close follow up as outpatient with repeat echocardiogram in 2 weeks.

## 2022-11-11 NOTE — Assessment & Plan Note (Addendum)
Patient presented with volume overload, apparently she has lost weight making outpatient ultrafiltration not sufficient.  She underwent hemodialysis with ultrafiltration, with a lower dry weight with improvement of her symptoms.   Patient will continue hemodialysis on Monday, Wednesday and Friday schedule.  Follow up with nephrology as outpatient.  Anemia of chronic renal disease with iron deficiency. Hgb has been stable, continue with epo and iron supplementation.   Metabolic bone disease contin with calcitriol and calcium acetate.  On Sevelamer.   Acute hypoxemic respiratory failure due to volume overload acute pulmonary edema (present on admission) Documented 02 saturation on admission 88% on room air).  Chronic right pleural effusion, possible loculated related with chronic renal disease.  Patient's symptoms improved with fluid removal.  Her oxygenation at the time of her discharge 100% on room air.

## 2022-11-11 NOTE — Assessment & Plan Note (Addendum)
Continue with nutritional supplementation. As needed hydroxyzine.

## 2022-11-11 NOTE — Plan of Care (Signed)
  Problem: Clinical Measurements: Goal: Ability to maintain clinical measurements within normal limits will improve Outcome: Progressing Goal: Diagnostic test results will improve Outcome: Progressing Goal: Respiratory complications will improve Outcome: Progressing Goal: Cardiovascular complication will be avoided Outcome: Progressing   Problem: Nutrition: Goal: Adequate nutrition will be maintained Outcome: Progressing   

## 2022-11-11 NOTE — Discharge Summary (Signed)
Physician Discharge Summary   Patient: Natasha Long MRN: [id number] DOB: 1996/07/22  Admit date:     11/01/2022  Discharge date: 11/11/22  Discharge Physician: [name] [name]   PCP: [name], MD   Recommendations at discharge:    Continue blood pressure control with hydralazine and isosorbide Changed diltiazem to carvedilol. Continue torsemide 100 mg daily. Follow up with cardiology on 11/24/22 for outpatient echocardiogram. Continue renal replacement therapy Monday, Wednesday and Friday.   Discharge Diagnoses: Principal Problem:   ESRD (end stage renal disease) ([place]) Active Problems:   Primary hypertension   Pericardial effusion   Acute diastolic CHF (congestive heart failure) (HCC)   Borderline personality disorder (HCC)   Moderate protein-calorie malnutrition ([place])  Resolved Problems:   * No resolved hospital problems. *  Hospital Course: Natasha Long was admitted to the hospital with the working diagnosis of volume overload in the setting of ESRD on HD.    26 y.o. 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    Patient again presents at this time with substernal chest pain for 2 days.  Not a good historian. In  the ED  vitals temp 98.8, HR 113, RR 16, blood pressure 151/100, 02 saturation 91% on room air. Ill looking appearing, lungs with no wheezing or rales, heart with S1 and S2 present and rhythmic, abdomen with no distention, positive lower extremity edema.   Na 139, K 4,7 Cl 100 bicarbonate 27 glucose 89 bun 49 and cr 8,93  BNP 1,485 High sensitive troponin 365 and 366  Wbc 9,7 hgb 8,5 plt 300  Sars covid 19 negative  Influenza A and B negative   Chest radiograph with cardiomegaly, bilateral hilar vascular congestion and cephalization of the vasculature. Right pleural effusion with fluid in the right fissure, small left pleural effusion.   EKG 105 bpm, normal axis, normal intervals, sinus rhythm with poor R R wave progression with no significant ST segment or T wave changes, low voltage.   Patient underwent hemodialysis with ultrafiltration with improvement in volume status.  Plan to follow up pericardial effusion as outpatient.       Assessment and Plan: * ESRD (end stage renal disease) ([place]) Patient presented with volume overload, apparently she has lost weight making outpatient ultrafiltration not sufficient.  She underwent hemodialysis with ultrafiltration, with a lower dry weight with improvement of her symptoms.   Patient will continue hemodialysis on Monday, Wednesday and Friday schedule.  Follow up with nephrology as outpatient.  Anemia of chronic renal disease with iron deficiency. Hgb has been stable, continue with epo and iron supplementation.   Metabolic bone disease contin with calcitriol and calcium acetate.  On Sevelamer.   Acute hypoxemic respiratory failure due to volume overload acute pulmonary edema (present on admission) Documented 02 saturation on admission 88% on room air).  Chronic right pleural effusion, possible loculated related with chronic renal disease.  Patient's symptoms improved with fluid removal.  Her oxygenation at the time of her discharge 100%  on room air.   Primary hypertension Hypertensive urgency on admission, volume overloaded.  At the time of her discharge her blood pressure has improved.  Systolic 035 to 465 mmHg.   Plan to continue with carvedilol, hydralazine and isosorbide. Diuresis with torsemide, and ultrafiltration with HD.   Pericardial effusion Chronic uremic pericardial effusion with no clinical signs of acute pericarditis.  Echocardiogram with large pericardial effusion in the posterior portion of the left ventricle with small moderate pericardial effusion in the anterior/ RV side, no RV or RA collapse. LV systolic function 55 to 68%. (No significant change from prior echocardiogram).   Cardiology and cardiothoracic consulted, multidisciplinary evaluation concluded that no pericardial drainage is indicated at this point in time.  Continue ultrafiltration with HD and close follow up as outpatient with repeat echocardiogram in 2 weeks.    Acute diastolic CHF (congestive heart failure) ([place]) Patient with preserved LV systolic function and large pericardial effusion with no tamponade physiology.   Volume status has improved with ultrafiltration. At the time of  her discharge her volume balance is negative -8,737 ml with improvement with her symptoms.   Plan to continue torsemide daily along with hemodialysis Monday, Wednesday and Friday.  Afterload reduction with hydralazine and isosorbide.  B blockade with carvedilol.   Borderline personality disorder ([place]) Depression.  Continue with trazodone, and aripiprazole   Moderate protein-calorie malnutrition ([place]) Continue with nutritional supplementation. As needed hydroxyzine.         Consultants: cardiology, nephrology and cardiothoracic surgery  Procedures performed: none   Disposition: Home (correctional facility)  Diet recommendation:  Cardiac diet DISCHARGE MEDICATION: Allergies as of 11/11/2022       Reactions   Bupropion Anxiety, Other (See  Comments), Nausea And Vomiting   Caused panic attacks Caused panic attacks    "Adverse effects"   Prozac [fluoxetine Hcl] Anxiety, Other (See Comments)   Caused panic attacks   Prednisone Other (See Comments)   Pt stated this med caused pancreatitis        Medication List     STOP taking these medications    diltiazem 360 MG 24 hr capsule Commonly known as: Cardizem CD   gabapentin 300 MG capsule Commonly known as: NEURONTIN   traZODone 100 MG tablet Commonly known as: DESYREL       TAKE these medications    acetaminophen 325 MG tablet Commonly known as: TYLENOL Take 2 tablets (650 mg total) by mouth every 6 (six) hours as needed for mild pain (or Fever >/= 101). What changed:  medication strength how much to take when to take this reasons to take this   ARIPiprazole 5 MG tablet Commonly known as: ABILIFY Take 1.5 tablets (7.5 mg total) by mouth daily.   calcitRIOL 0.25 MCG capsule Commonly known as: ROCALTROL Take 1 capsule (0.25 mcg total) by mouth Every Tuesday,Thursday,and Saturday with dialysis. What changed: when to take this   calcium acetate 667 MG capsule Commonly known as: PHOSLO Take 1 capsule (667 mg total) by mouth 3 (three) times daily with meals.   carvedilol 6.25 MG tablet Commonly known as: COREG Take 1 tablet (6.25 mg total) by mouth 2 (two) times daily with a meal.   dicyclomine 20 MG tablet Commonly known as: BENTYL Take 1 tablet (20 mg total) by mouth every 6 (six) hours as needed for spasms (abdominal cramping).   hydrALAZINE 50 MG tablet Commonly known as: APRESOLINE Take 1 tablet (50 mg total) by mouth every 8 (eight) hours.   hydrOXYzine 25  MG capsule Commonly known as: VISTARIL Take 1 capsule (25 mg total) by mouth every 8 (eight) hours as needed for anxiety. What changed:  how much to take when to take this   isosorbide mononitrate 60 MG 24 hr tablet Commonly known as: IMDUR Take 1 tablet (60 mg total) by mouth  daily.   methocarbamol 500 MG tablet Commonly known as: ROBAXIN Take 1 tablet (500 mg total) by mouth every 8 (eight) hours as needed for muscle spasms.   multivitamin Tabs tablet Take 1 tablet by mouth daily.   Renvela 800 MG tablet Generic drug: sevelamer carbonate Take 2 tablets (1,600 mg total) by mouth 3 (three) times daily with meals.   torsemide 100 MG tablet Commonly known as: DEMADEX Take 1 tablet (100 mg total) by mouth every morning.        Follow-up [facility]. [facility] Follow up.   Specialty: Cardiology Why: Appointment for follow-up Echocardiogram scheduled for 11/24/2022 at 2:50pm at our [facility] office. Please arrive 15 minutes early for check-in. Contact information: [address], [place] [id number] [place] [phone]        [facility], [place] Follow up.   Specialties: Cardiology, Radiology Why: Hospital follow-up with Cardiology scheduled for 11/27/2022 at 8:50am at our [facility] office. Please arrive 15 minutes early for check-in. Contact information: [address] [place] [address] [phone]                Discharge Exam: Danley Danker Weights   11/09/22 1437 11/09/22 1844 11/11/22 0901  Weight: 87.4 kg 83.8 kg 85.8 kg   BP (!) 138/94 (BP Location: Right Arm)   Pulse (!) 107   Temp 98.9 F (37.2 C)   Resp (!) 25   Ht 5\' 8"  (1.727 m)   Wt 85.8 kg   SpO2 100%   BMI 28.76 kg/m   Patient is feeling better, no dyspnea or chest pain, no nausea or vomiting. Patient has remained afebrile.   Neurology awake and alert ENT with mild pallor Cardiovascular with S1 and S2 present and rhythmic, with no gallops, or rubs, positive murmur at the apex. Respiratory with no rales or wheezing, mild decreased breath sounds at the right base Abdomen with no distention  No lower extremity edema   Condition at discharge: stable  The  results of significant diagnostics from this hospitalization (including imaging, microbiology, ancillary and laboratory) are listed below for reference.   Imaging Studies: ECHOCARDIOGRAM LIMITED  Result Date: 11/05/2022    ECHOCARDIOGRAM LIMITED REPORT   Patient Name:   KEWANA SANON Date of Exam: 11/05/2022 Medical Rec #:  [id number]         Height:       68.0 in Accession #:    [id number]        Weight:       197.1 lb Date of Birth:  May 23, 1996         BSA:          2.031 m Patient Age:    26 years          BP:           133/71 mmHg Patient Gender: F                 HR:           88 bpm. Exam Location:  Inpatient Procedure: Limited Echo and  Cardiac Doppler Indications:    Pericardial effusion I31.3  History:        Patient has prior history of Echocardiogram examinations, most                 recent 11/04/2022. CHF, Pericardial Disease, Mitral Valve                 Disease, Signs/Symptoms:Chest Pain; Risk Factors:Hypertension                 and Current Smoker.  Sonographer:    Greer Pickerel Referring Phys: [place] Comments: Image acquisition challenging due to patient body habitus and Image acquisition challenging due to respiratory motion. IMPRESSIONS  1. Large pericardial effusion in the posterior portion of the left ventricle with small-moderate pericardial effusion in the anterior/RV side. There is no RV or RA collapse. IVC not well visualized. There is excessive respiratory variation in the mitral  valve spectral Doppler velocities.  2. Left ventricular ejection fraction, by estimation, is 55 to 60%. The left ventricle has normal function.  3. The mitral valve is normal in structure. not assessed mitral valve regurgitation. not assesed mitral stenosis.  4. Tricuspid valve regurgitation not assessed. not assessed tricuspid stenosis.  5. Aortic valve regurgitation not assessed. not assessed. Comparison(s): No significant change from prior study. FINDINGS  Left Ventricle: Left  ventricular ejection fraction, by estimation, is 55 to 60%. The left ventricle has normal function. Pericardium: Large pericardial effusion in the posterior portion of the left ventricle with small-moderate pericardial effusion in the anterior/RV side. There is no RV or RA collapse. IVC not well visualized. There is excessive respiratory variation in the mitral valve spectral Doppler velocities. There is excessive respiratory variation in the mitral valve spectral Doppler velocities. Mitral Valve: The mitral valve is normal in structure. Not assessed mitral valve regurgitation. Not assesed mitral valve stenosis. Tricuspid Valve: Tricuspid valve regurgitation not assessed. not assessed tricuspid stenosis. Aortic Valve: Aortic valve regurgitation not assessed. Not assessed. Additional Comments: Spectral Doppler performed.  Berniece Salines DO Electronically signed by Berniece Salines DO Signature Date/Time: 11/05/2022/5:39:34 PM    Final    ECHOCARDIOGRAM COMPLETE  Result Date: 11/04/2022    ECHOCARDIOGRAM REPORT   Patient Name:   LAELYNN BLIZZARD Date of Exam: 11/04/2022 Medical Rec #:  [id number]         Height:       68.0 in Accession #:    [id number]        Weight:       205.9 lb Date of Birth:  January 12, 1996         BSA:          2.069 m Patient Age:    26 years          BP:           129/81 mmHg Patient Gender: F                 HR:           91 bpm. Exam Location:  Inpatient Procedure: 2D Echo, Cardiac Doppler and Color Doppler Indications:    Pericardial effusion  History:        Patient has prior history of Echocardiogram examinations, most                 recent 10/25/2022. CHF, Signs/Symptoms:Chest Pain and Shortness                 of Breath;  Risk Factors:Hypertension and Current Smoker. ESRD on                 Dialysis, Polysubstance abuse.  Sonographer:    Wenda Low Referring Phys: [place]  1. Left ventricular ejection fraction, by estimation, is 55 to 60%. The left ventricle has  normal function. The left ventricle has no regional wall motion abnormalities. There is severe concentric left ventricular hypertrophy. Left ventricular diastolic  parameters are indeterminate. Elevated left ventricular end-diastolic pressure.  2. Right ventricular systolic function is normal. The right ventricular size is mildly enlarged. There is moderately elevated pulmonary artery systolic pressure. The estimated right ventricular systolic pressure is 16.1 mmHg.  3. The mitral valve is degenerative. Moderate mitral valve regurgitation. Moderate mitral stenosis. The mean mitral valve gradient is 6.0 mmHg. Moderate mitral annular calcification.  4. Tricuspid valve regurgitation is moderate to severe.  5. The aortic valve is tricuspid. Aortic valve regurgitation is trivial. Moderate aortic valve stenosis. Aortic valve area, by VTI measures 1.37 cm. Aortic valve mean gradient measures 24.0 mmHg. Aortic valve Vmax measures 3.42 m/s.  6. The inferior vena cava is dilated in size with <50% respiratory variability, suggesting right atrial pressure of 15 mmHg.  7. Moderate to large circumferential pericardial effusion with greatest diameter posterior to the LV measuring 3.02cm. There is no RV diastolic collapse or RA inversion. There is excessive respiratory variation in the mitral valve spectral Doppler velocities. The IVC is dilated and plethoric.  8. Compared to prior study dated 10/25/2022, the pericardial effusion has increased in size and now there is excessive respiratory variation in MV doppler velocities but no RV diastolic colllpase or RA inversion. Clinical correlation recommended. FINDINGS  Left Ventricle: Left ventricular ejection fraction, by estimation, is 55 to 60%. The left ventricle has normal function. The left ventricle has no regional wall motion abnormalities. The left ventricular internal cavity size was normal in size. There is  severe concentric left ventricular hypertrophy. Left ventricular  diastolic parameters are indeterminate. Elevated left ventricular end-diastolic pressure. Right Ventricle: The right ventricular size is mildly enlarged. No increase in right ventricular wall thickness. Right ventricular systolic function is normal. There is moderately elevated pulmonary artery systolic pressure. The tricuspid regurgitant velocity is 2.99 m/s, and with an assumed right atrial pressure of 15 mmHg, the estimated right ventricular systolic pressure is 09.6 mmHg. Left Atrium: Left atrial size was normal in size. Right Atrium: Right atrial size was normal in size. Pericardium: Moderate to large circumferential pericardial effusion with greatest diameter posterior to the LV measuring 3.02cm. There is no RV diastolic collapse or RA inversion. A moderately sized pericardial effusion is present. The pericardial effusion is circumferential. There is excessive respiratory variation in the mitral valve spectral Doppler velocities. Mitral Valve: The mitral valve is degenerative in appearance. There is moderate thickening of the mitral valve leaflet(s). Moderate mitral annular calcification. Moderate mitral valve regurgitation. Moderate mitral valve stenosis. MV peak gradient, 11.0 mmHg. The mean mitral valve gradient is 6.0 mmHg. Tricuspid Valve: The tricuspid valve is normal in structure. Tricuspid valve regurgitation is moderate to severe. No evidence of tricuspid stenosis. Aortic Valve: The aortic valve is tricuspid. Aortic valve regurgitation is trivial. Moderate aortic stenosis is present. Aortic valve mean gradient measures 24.0 mmHg. Aortic valve peak gradient measures 46.9 mmHg. Aortic valve area, by VTI measures 1.37  cm. Pulmonic Valve: The pulmonic valve was normal in structure. Pulmonic valve regurgitation is not visualized. No evidence of pulmonic stenosis. Aorta:  The aortic root is normal in size and structure. Venous: The inferior vena cava is dilated in size with less than 50% respiratory  variability, suggesting right atrial pressure of 15 mmHg. IAS/Shunts: No atrial level shunt detected by color flow Doppler.  LEFT VENTRICLE PLAX 2D LVIDd:         4.80 cm   Diastology LVIDs:         3.20 cm   LV e' medial:    8.70 cm/s LV PW:         1.60 cm   LV E/e' medial:  18.2 LV IVS:        1.60 cm   LV e' lateral:   6.64 cm/s LVOT diam:     2.00 cm   LV E/e' lateral: 23.8 LV SV:         81 LV SV Index:   39 LVOT Area:     3.14 cm  RIGHT VENTRICLE RV Basal diam:  3.90 cm RV Mid diam:    3.10 cm RV S prime:     10.90 cm/s TAPSE (M-mode): 2.2 cm LEFT ATRIUM             Index        RIGHT ATRIUM           Index LA diam:        4.20 cm 2.03 cm/m   RA Area:     18.60 cm LA Vol (A2C):   88.1 ml 42.58 ml/m  RA Volume:   54.30 ml  26.24 ml/m LA Vol (A4C):   51.3 ml 24.79 ml/m LA Biplane Vol: 67.3 ml 32.52 ml/m  AORTIC VALVE                     PULMONIC VALVE AV Area (Vmax):    1.29 cm      PV Vmax:       1.49 m/s AV Area (Vmean):   1.35 cm      PV Peak grad:  8.9 mmHg AV Area (VTI):     1.37 cm AV Vmax:           342.25 cm/s AV Vmean:          223.750 cm/s AV VTI:            0.593 m AV Peak Grad:      46.9 mmHg AV Mean Grad:      24.0 mmHg LVOT Vmax:         140.00 cm/s LVOT Vmean:        96.200 cm/s LVOT VTI:          0.259 m LVOT/AV VTI ratio: 0.44  AORTA Ao Root diam: 3.20 cm Ao Asc diam:  3.60 cm MITRAL VALVE                  TRICUSPID VALVE MV Area (PHT): 2.86 cm       TR Peak grad:   35.8 mmHg MV Area VTI:   1.85 cm       TR Vmax:        299.00 cm/s MV Peak grad:  11.0 mmHg MV Mean grad:  6.0 mmHg       SHUNTS MV Vmax:       1.66 m/s       Systemic VTI:  0.26 m MV Vmean:      116.0 cm/s     Systemic Diam: 2.00 cm MV Decel Time: 265 msec MR Peak  grad:    129.5 mmHg MR Mean grad:    84.0 mmHg MR Vmax:         569.00 cm/s MR Vmean:        436.0 cm/s MR PISA:         1.01 cm MR PISA Eff ROA: 16 mm MR PISA Radius:  0.40 cm MV E velocity: 158.00 cm/s MV A velocity: 112.00 cm/s MV E/A ratio:  1.41 Fransico Him MD Electronically signed by Fransico Him MD Signature Date/Time: 11/04/2022/10:00:13 PM    Final    DG Chest 2 View  Result Date: 11/02/2022 CLINICAL DATA:  Dialysis patient with shortness of breath. Chest pain. Cough and congestion. EXAM: CHEST - 2 VIEW COMPARISON:  Portable chest and chest CT both 10/26/2023 FINDINGS: Moderately enlarged heart silhouette, on CT was noted in part due to a sizable pericardial effusion which is probably still present. Central vascular prominence is again seen without overt edema. A moderately large right and small left pleural effusions are again shown. Overlying atelectasis or consolidation is again noted in the right-greater-than-left lower lung fields. Some of the fluid tracks into the horizontal fissure on the right. The remaining lungs are clear. Overall aeration seems unchanged. The pleural effusions were similar in appearance previously. IMPRESSION: 1. Moderately large heart silhouette, on CT was noted in part due to a sizable pericardial effusion which is probably still present. 2. Central vascular prominence without overt edema. 3. Moderately large right and small left pleural effusions with overlying atelectasis or consolidation. Similar appearance on 10/25/2022. Electronically Signed   By: Telford Nab M.D.   On: 11/02/2022 00:11   VAS US DUPLEX DIALYSIS ACCESS (AVF, AVG)  Result Date: 10/28/2022 DIALYSIS ACCESS Patient Name:  JAMIA HOBAN  Date of Exam:   10/28/2022 Medical Rec #: [id number]          Accession #:    [id number] Date of Birth: 1996/07/14          Patient Gender: F Patient Age:   48 years Exam Location:  [facility] Procedure:      VAS US DUPLEX DIALYSIS ACCESS (AVF, AVG) Referring Phys: Jen Mow --------------------------------------------------------------------------------  Reason for Exam: Patient concern for pain and possible infiltration during                  dialysis. No clinical evidence of infiltration per  notes. Access Site: Left Upper Extremity. Access Type: Brachial-cephalic AVF. Limitations: Patient movement, tortuosity of AVF Comparison Study: 01-20-2021 Prior duplex dialysis access showed patent                   brachial-cephalic AVF with one competing branch Performing Technologist: Darlin Coco RDMS, RVT  Examination Guidelines: A complete evaluation includes B-mode imaging, spectral Doppler, color Doppler, and power Doppler as needed of all accessible portions of each vessel. Unilateral testing is considered an integral part of a complete examination. Limited examinations for reoccurring indications may be performed as noted.  Findings: +--------------------+----------+-----------------+--------+ AVF                 PSV (cm/s)Flow Vol (mL/min)Comments +--------------------+----------+-----------------+--------+ Native artery inflow   234          1890                +--------------------+----------+-----------------+--------+ AVF Anastomosis        229                              +--------------------+----------+-----------------+--------+  +---------------+----------+-------------+----------+--------------------------+  OUTFLOW VEIN   PSV (cm/s)Diameter (cm)Depth (cm)         Describe          +---------------+----------+-------------+----------+--------------------------+ Subclavian vein   383                                                      +---------------+----------+-------------+----------+--------------------------+ Axillary vein      69                                                      +---------------+----------+-------------+----------+--------------------------+ Confluence        393                                                      +---------------+----------+-------------+----------+--------------------------+ Prox UA            47        1.21        0.21                               +---------------+----------+-------------+----------+--------------------------+ Mid UA            204        2.17        0.20      aneurysmal and small                                                              branch           +---------------+----------+-------------+----------+--------------------------+ Dist UA            41        2.00        0.20   aneurysmal and velocities                                                          <100 cm/s          +---------------+----------+-------------+----------+--------------------------+  Summary: Arteriovenous fistula-Velocities less than 100cm/s noted. Arteriovenous fistula-Aneurysmal dilatation noted.  Appearance of subcutaneous edema of the surrounding tissues, most notably at the proximal to mid upper arm.  *See table(s) above for measurements and observations.  Diagnosing physician: Harold Barban MD Electronically signed by Harold Barban MD on 10/28/2022 at 7:44:50 PM.   --------------------------------------------------------------------------------   Final    ECHOCARDIOGRAM COMPLETE  Result Date: 10/25/2022    ECHOCARDIOGRAM REPORT   Patient Name:   [name] Date of Exam: 10/25/2022 Medical Rec #:  [id number]         Height:       68.0 in Accession #:  [id number]        Weight:       200.0 lb Date of Birth:  1996/02/22         BSA:          2.044 m Patient Age:    26 years          BP:           170/123 mmHg Patient Gender: F                 HR:           108 bpm. Exam Location:  Inpatient Procedure: 2D Echo, Cardiac Doppler, Color Doppler and 3D Echo STAT ECHO Indications:    Pericardial effusion  History:        Patient has prior history of Echocardiogram examinations, most                 recent 08/25/2022. Mitral Valve Disease, Signs/Symptoms:Shortness                 of Breath; Risk Factors:Hypertension. Polysubstance abuse.                 HFrEF. ESRD.  Sonographer:    Clayton Lefort RDCS (AE) Referring Phys: [place]  1. Left ventricular ejection fraction, by estimation, is 40 to 45%. Left ventricular ejection fraction by 3D volume is 43 %. The left ventricle has mildly decreased function. The left ventricle demonstrates global hypokinesis. The left ventricular internal cavity size was mildly dilated. There is severe concentric left ventricular hypertrophy. Left ventricular diastolic parameters are consistent with Grade II diastolic dysfunction (pseudonormalization). Elevated left atrial pressure.  2. Right ventricular systolic function is normal. The right ventricular size is moderately enlarged. There is severely elevated pulmonary artery systolic pressure. The estimated right ventricular systolic pressure is 96.7 mmHg.  3. Left atrial size was mildly dilated.  4. Right atrial size was mildly dilated.  5. Moderate pericardial effusion. The pericardial effusion is circumferential. There is no evidence of cardiac tamponade.  6. The mitral valve is degenerative. Severe mitral valve regurgitation. The mean mitral valve gradient is 7.1 mmHg with average heart rate of 110 bpm. Moderate to severe mitral annular calcification.  7. The tricuspid valve is abnormal. 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  8. The aortic valve is tricuspid. There is mild calcification of the aortic valve. There is mild thickening of the aortic valve. Aortic valve regurgitation is mild to moderate. Mild to moderate aortic valve stenosis.  9. The inferior vena cava is dilated in size with <50% respiratory variability, suggesting right atrial pressure of 15 mmHg. Comparison(s): No significant change from prior study. Prior images reviewed side by side. FINDINGS  Left Ventricle: Left ventricular ejection fraction, by estimation, is 40 to 45%. Left ventricular ejection fraction by 3D volume is 43 %. The left ventricle has mildly decreased function. The left ventricle demonstrates global hypokinesis. The left ventricular internal cavity size was  mildly dilated. There is severe concentric left ventricular hypertrophy. Left ventricular diastolic parameters are consistent with Grade II diastolic dysfunction (pseudonormalization). Elevated left atrial pressure. Right Ventricle: The right ventricular size is moderately enlarged. No increase in right ventricular wall thickness. Right ventricular systolic function is normal. There is severely elevated pulmonary artery systolic pressure. The tricuspid regurgitant velocity is 3.51 m/s, and with an assumed right atrial pressure of 15 mmHg, the estimated right ventricular systolic pressure is 59.1 mmHg. Left Atrium: Left atrial size was mildly dilated. Right  Atrium: Right atrial size was mildly dilated. Pericardium: A moderately sized pericardial effusion is present. The pericardial effusion is circumferential. There is no evidence of cardiac tamponade. Mitral Valve: The mitral valve is degenerative in appearance. Moderate to severe mitral annular calcification. Severe mitral valve regurgitation, with centrally-directed jet. The mean mitral valve gradient is 7.1 mmHg with average heart rate of 110 bpm. Tricuspid Valve: The tricuspid valve is abnormal. 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 Aortic Valve: The aortic valve is tricuspid. There is mild calcification of the aortic valve. There is mild thickening of the aortic valve. Aortic valve regurgitation is mild to moderate. Mild to moderate aortic stenosis is present. Aortic valve mean gradient measures 26.0 mmHg. Aortic valve peak gradient measures 47.1 mmHg. Aortic valve area, by VTI measures 1.96 cm. Pulmonic Valve: The pulmonic valve was normal in structure. Pulmonic valve regurgitation is trivial. Aorta: The aortic root is normal in size and structure. Venous: The inferior vena cava is dilated in size with less than 50% respiratory variability, suggesting right atrial pressure of 15 mmHg. IAS/Shunts: No atrial level shunt detected by color flow Doppler.  Additional Comments: There is pleural effusion in the left lateral region.  LEFT VENTRICLE PLAX 2D LVIDd:         5.80 cm LVIDs:         4.40 cm LV PW:         1.80 cm         3D Volume EF LV IVS:        1.60 cm         LV 3D EF:    Left LVOT diam:     2.10 cm                      ventricul LV SV:         99                           ar LV SV Index:   48                           ejection LVOT Area:     3.46 cm                     fraction                                             by 3D                                             volume is                                             43 %.                                 3D Volume EF:  3D EF:        43 %                                LV EDV:       291 ml                                LV ESV:       167 ml                                LV SV:        124 ml RIGHT VENTRICLE             IVC RV Basal diam:  5.30 cm     IVC diam: 2.50 cm RV S prime:     17.70 cm/s TAPSE (M-mode): 1.9 cm LEFT ATRIUM           Index        RIGHT ATRIUM           Index LA diam:      3.40 cm 1.66 cm/m   RA Area:     20.40 cm LA Vol (A4C): 89.7 ml 43.89 ml/m  RA Volume:   55.40 ml  27.11 ml/m  AORTIC VALVE AV Area (Vmax):    1.93 cm AV Area (Vmean):   1.78 cm AV Area (VTI):     1.96 cm AV Vmax:           343.00 cm/s AV Vmean:          239.000 cm/s AV VTI:            0.504 m AV Peak Grad:      47.1 mmHg AV Mean Grad:      26.0 mmHg LVOT Vmax:         191.00 cm/s LVOT Vmean:        123.000 cm/s LVOT VTI:          0.285 m LVOT/AV VTI ratio: 0.57  AORTA Ao Root diam: 3.00 cm MITRAL VALVE              TRICUSPID VALVE MV Area (PHT): 2.51 cm   TR Peak grad:   49.3 mmHg MV Mean grad:  7.1 mmHg   TR Vmax:        351.00 cm/s MV Decel Time: 303 msec MR Peak grad: 122.8 mmHg  SHUNTS MR Mean grad: 83.0 mmHg   Systemic VTI:  0.29 m MR Vmax:      554.00 cm/s Systemic Diam: 2.10 cm MR Vmean:     438.0 cm/s Dani Gobble Croitoru MD Electronically signed by Sanda Klein MD  Signature Date/Time: 10/25/2022/12:08:01 PM    Final    CT Angio Chest PE W and/or Wo Contrast  Result Date: 10/25/2022 CLINICAL DATA:  Rule out pulmonary embolus. EXAM: CT ANGIOGRAPHY CHEST WITH CONTRAST TECHNIQUE: Multidetector CT imaging of the chest was performed using the standard protocol during bolus administration of intravenous contrast. Multiplanar CT image reconstructions and MIPs were obtained to evaluate the vascular anatomy. RADIATION DOSE REDUCTION: This exam was performed according to the departmental dose-optimization program which includes automated exposure control, adjustment of the mA and/or kV according to patient size and/or use of iterative reconstruction technique. CONTRAST:  81mL OMNIPAQUE IOHEXOL 350 MG/ML SOLN COMPARISON:  09/04/2022 FINDINGS: Cardiovascular: Cardiac enlargement is stable. Moderate to large pericardial effusion is again noted. Satisfactory opacification of the pulmonary arteries to the segmental level. No evidence of acute pulmonary embolism. Mediastinum/Nodes: Thyroid gland, trachea and esophagus are unremarkable. No adenopathy identified. Diffuse edema noted throughout the mediastinal fat. Lungs/Pleura: There is been interval resolution of previous left pleural effusion. Unchanged appearance moderate right pleural effusion. Scattered areas of subsegmental atelectasis are identified bilaterally. No airspace disease identified. Upper Abdomen: No acute abnormality. Perihepatic ascites noted. Signs of previous cholecystectomy. Musculoskeletal: Marked diffuse edema is identified throughout the chest wall and abdominal wall. No acute or suspicious osseous findings. Review of the MIP images confirms the above findings. IMPRESSION: 1. No evidence for acute pulmonary embolism. 2. Stable cardiomegaly with moderate to large pericardial effusion. 3. Stable moderate right pleural effusion.Interval resolution of previous left pleural effusion. 4. Marked diffuse edema throughout  the chest wall and abdominal wall compatible with anasarca. 5. Perihepatic ascites. Electronically Signed   By: Kerby Moors M.D.   On: 10/25/2022 08:44   DG Chest Port 1 View  Result Date: 10/25/2022 CLINICAL DATA:  Shortness of breath. EXAM: PORTABLE CHEST 1 VIEW COMPARISON:  October 24, 2022 FINDINGS: The cardiac silhouette is moderately enlarged and unchanged in size. Stable, moderate severity atelectasis and/or infiltrate is seen within the bilateral lung bases. There is stable prominence of the perihilar pulmonary vasculature. A small left pleural effusion is seen. A moderate sized right pleural effusion is also noted. This is decreased in size when compared to the prior study. No pneumothorax is identified. The visualized skeletal structures are unremarkable. IMPRESSION: 1. Stable cardiomegaly and pulmonary vascular congestion. 2. Stable, moderate severity bibasilar atelectasis and/or infiltrate. 3. Moderate sized right-sided pleural effusion, decreased in size when compared to the prior exam. Electronically Signed   By: Virgina Norfolk M.D.   On: 10/25/2022 03:03   CT Lumbar Spine Wo Contrast  Result Date: 10/24/2022 CLINICAL DATA:  Trauma. Back pain. EXAM: CT LUMBAR SPINE WITHOUT CONTRAST TECHNIQUE: Multidetector CT imaging of the lumbar spine was performed without intravenous contrast administration. Multiplanar CT image reconstructions were also generated. RADIATION DOSE REDUCTION: This exam was performed according to the departmental dose-optimization program which includes automated exposure control, adjustment of the mA and/or kV according to patient size and/or use of iterative reconstruction technique. COMPARISON:  CT scan 08/31/2022 FINDINGS: Segmentation: There are five lumbar type vertebral bodies. The last full intervertebral disc space is labeled L5-S1. Alignment: Normal Vertebrae: No acute fracture. Paraspinal and other soft tissues: No significant paraspinal findings. Stable SI  joint degenerative changes. Age advanced vascular calcifications. Moderate free pelvic fluid. Severe diffuse body wall edema. Disc levels: Stable advanced degenerative disc disease at L5-S1 with bulging annulus and osteophytic ridging but no significant spinal or foraminal stenosis in the lumbar spine. IMPRESSION: 1. Normal alignment of the lumbar vertebral bodies and no acute fracture. 2. Stable advanced degenerative disc disease at L5-S1 with bulging annulus and osteophytic ridging but no significant spinal or foraminal stenosis. 3. Age advanced vascular calcifications. 4. Moderate free pelvic fluid and severe diffuse body wall edema. Electronically Signed   By: Marijo Sanes M.D.   On: 10/24/2022 18:51   DG Chest Portable 1 View  Result Date: 10/24/2022 CLINICAL DATA:  Dyspnea after missing dialysis EXAM: PORTABLE CHEST 1 VIEW COMPARISON:  Radiograph 10/04/2022 FINDINGS: Interval increase in the now moderate right pleural effusion with similar small left pleural effusion. Similar cardiomegaly. Pulmonary vascular congestion has increased since 10/04/2022 with lower  lung infiltrates which may represent edema or infection. No acute osseous abnormality. IMPRESSION: Constellation of findings favored to represent worsening CHF. Increased moderate right and similar small left pleural effusions. Electronically Signed   By: Placido Sou M.D.   On: 10/24/2022 16:25    Microbiology: Results for orders placed or performed during the hospital encounter of 11/01/22  Resp Panel by RT-PCR (Flu A&B, Covid) Anterior Nasal Swab     Status: None   Collection Time: 11/02/22  4:33 AM   Specimen: Anterior Nasal Swab  Result Value Ref Range Status   SARS Coronavirus 2 by RT PCR NEGATIVE NEGATIVE Final    Comment: (NOTE) SARS-CoV-2 target nucleic acids are NOT DETECTED.  The SARS-CoV-2 RNA is generally detectable in upper respiratory specimens during the acute phase of infection. The lowest concentration of  SARS-CoV-2 viral copies this assay can detect is 138 copies/mL. A negative result does not preclude SARS-Cov-2 infection and should not be used as the sole basis for treatment or other patient management decisions. A negative result may occur with  improper specimen collection/handling, submission of specimen other than nasopharyngeal swab, presence of viral mutation(s) within the areas targeted by this assay, and inadequate number of viral copies(<138 copies/mL). A negative result must be combined with clinical observations, patient history, and epidemiological information. The expected result is Negative.  Fact Sheet for Patients:  EntrepreneurPulse.com.au  Fact Sheet for Healthcare Providers:  IncredibleEmployment.be  This test is no t yet approved or cleared by the [place] FDA and  has been authorized for detection and/or diagnosis of SARS-CoV-2 by FDA under an Emergency Use Authorization (EUA). This EUA will remain  in effect (meaning this test can be used) for the duration of the COVID-19 declaration under Section 564(b)(1) of the Act, 21 U.S.C.section 360bbb-3(b)(1), unless the authorization is terminated  or revoked sooner.       Influenza A by PCR NEGATIVE NEGATIVE Final   Influenza B by PCR NEGATIVE NEGATIVE Final    Comment: (NOTE) The Xpert Xpress SARS-CoV-2/FLU/RSV plus assay is intended as an aid in the diagnosis of influenza from Nasopharyngeal swab specimens and should not be used as a sole basis for treatment. Nasal washings and aspirates are unacceptable for Xpert Xpress SARS-CoV-2/FLU/RSV testing.  Fact Sheet for Patients: EntrepreneurPulse.com.au  Fact Sheet for Healthcare Providers: IncredibleEmployment.be  This test is not yet approved or cleared by the [place] FDA and has been authorized for detection and/or diagnosis of SARS-CoV-2 by FDA under an Emergency Use  Authorization (EUA). This EUA will remain in effect (meaning this test can be used) for the duration of the COVID-19 declaration under Section 564(b)(1) of the Act, 21 U.S.C. section 360bbb-3(b)(1), unless the authorization is terminated or revoked.  Performed at [facility], [place] [address], [place],  [address]   MRSA Next Gen by PCR, Nasal     Status: Abnormal   Collection Time: 11/05/22  5:37 PM   Specimen: Nasal Mucosa; Nasal Swab  Result Value Ref Range Status   MRSA by PCR Next Gen DETECTED (A) NOT DETECTED Final    Comment: RESULT CALLED TO, READ BACK BY AND VERIFIED WITH: RN GABBY BAUTISTA ON 11/05/22 @ 2007 BY DRT (NOTE) The GeneXpert MRSA Assay (FDA approved for NASAL specimens only), is one component of a comprehensive MRSA colonization surveillance program. It is not intended to diagnose MRSA infection nor to guide or monitor treatment for MRSA infections. Test performance is not FDA approved in patients less than 2  years old. Performed at [facility], [place] [address] [place], [place] [address]     Labs: CBC: Recent Labs  Lab 11/06/22 0542 11/07/22 0055 11/09/22 0339 11/11/22 0730  WBC  --   --  8.7 7.3  HGB 7.1* 8.1* 7.9* 7.6*  HCT 23.6* 26.6* 25.2* 25.3*  MCV  --   --  94.4 94.4  PLT  --   --  362 884   Basic Metabolic Panel: Recent Labs  Lab 11/09/22 0339  NA 137  K 4.6  CL 97*  CO2 26  GLUCOSE 95  BUN 59*  CREATININE 9.46*  CALCIUM 9.5  MG 2.2  PHOS 6.0*   Liver Function Tests: No results for input(s): "AST", "ALT", "ALKPHOS", "BILITOT", "PROT", "ALBUMIN" in the last 168 hours. CBG: No results for input(s): "GLUCAP" in the last 168 hours.  Discharge time spent: greater than 30 minutes.  Signed: Tawni Millers, MD [facility] 11/11/2022

## 2022-11-11 NOTE — Progress Notes (Signed)
DISCHARGE NOTE HOME Natasha Long to be discharged return to [facility]. Diagnosis, treatment,  Discussed prescriptions and follow up appointments with the patient. Prescriptions given to patient; medication list explained in detail. Patient verbalized understanding. Unable to reach Nurse at [facility] for report. Left message and called three times. Discharge information (AVS) given to [employer].   Skin clean, dry and intact without evidence of skin break down, no evidence of skin tears noted. IV catheter discontinued intact. Site without signs and symptoms of complications.No complaints noted.  Patient free of lines, drains, and wounds. VSS. No complaints of pain.   Virgina Jock, RN

## 2022-11-11 NOTE — Progress Notes (Signed)
D/C order noted. Contacted Deon at [facility] [place] [phone]) to make her aware that pt will d/c today and will need transportation to out-pt HD on Friday. Deon to contact transportation regarding this need. Contacted [facility] to make clinic aware of pt's d/c today and that pt will resume care on Friday. Clinic aware pt is still incarcerated at this time and jail should provide transportation to HD appt. Will add appt to AVS as well.   Melven Sartorius Renal Navigator [phone]

## 2022-11-11 NOTE — Progress Notes (Signed)
[facility] Progress Note   Subjective:   Seen prior to HD, eyes closed but shakes head yes/no to questions. Denies SOB, CP, dizziness and nausea.   Objective Vitals:   11/10/22 1007 11/10/22 1714 11/10/22 2221 11/11/22 0509  BP: 137/87 (!) 130/92    Pulse: (!) 112 (!) 112    Resp:   (!) 33 (!) 24  Temp: (!) 97.5 F (36.4 C) 99.7 F (37.6 C)    TempSrc: Oral Oral    SpO2: 95% 94%    Weight:      Height:       Physical Exam General: Alert but eyes closed, NAD Heart: RRR, no murmurs, rubs or gallops Lungs: CTA bilaterally Abdomen: Non-distended Extremities: trace edema b/l lower extremities Dialysis Access: LUE AVF + bruit  Additional Objective Labs: Basic Metabolic Panel: Recent Labs  Lab 11/09/22 0339  NA 137  K 4.6  CL 97*  CO2 26  GLUCOSE 95  BUN 59*  CREATININE 9.46*  CALCIUM 9.5  PHOS 6.0*   Liver Function Tests: No results for input(s): "AST", "ALT", "ALKPHOS", "BILITOT", "PROT", "ALBUMIN" in the last 168 hours. No results for input(s): "LIPASE", "AMYLASE" in the last 168 hours. CBC: Recent Labs  Lab 11/06/22 0542 11/07/22 0055 11/09/22 0339  WBC  --   --  8.7  HGB 7.1* 8.1* 7.9*  HCT 23.6* 26.6* 25.2*  MCV  --   --  94.4  PLT  --   --  362   Blood Culture    Component Value Date/Time   SDES BLOOD SITE NOT SPECIFIED 10/25/2022 1111   SPECREQUEST  10/25/2022 1111    BOTTLES DRAWN AEROBIC AND ANAEROBIC Blood Culture adequate volume   CULT  10/25/2022 1111    NO GROWTH 5 DAYS Performed at [facility], [place] [address] [place], [place] [address]    REPTSTATUS 10/30/2022 FINAL 10/25/2022 1111    Cardiac Enzymes: No results for input(s): "CKTOTAL", "CKMB", "CKMBINDEX", "TROPONINI" in the last 168 hours. CBG: No results for input(s): "GLUCAP" in the last 168 hours. Iron Studies: No results for input(s): "IRON", "TIBC", "TRANSFERRIN", "FERRITIN" in the last 72 hours. @lablastinr3 @ Studies/Results: No results  found. Medications:  ferric gluconate (FERRLECIT) IVPB 125 mg (11/09/22 1715)    ARIPiprazole  7.5 mg Oral Daily   calcitRIOL  0.25 mcg Oral Q M,W,F-HD   calcium acetate  667 mg Oral TID WC   carvedilol  6.25 mg Oral BID WC   Chlorhexidine Gluconate Cloth  6 each Topical Q0600   Chlorhexidine Gluconate Cloth  6 each Topical Q0600   darbepoetin (ARANESP) injection - DIALYSIS  100 mcg Subcutaneous Q Mon-1800   hydrALAZINE  50 mg Oral Q8H   isosorbide mononitrate  60 mg Oral Daily   sevelamer carbonate  1,600 mg Oral TID WC   torsemide  100 mg Oral q morning   traZODone  100 mg Oral QHS    Dialysis Orders: MWF at [place] 4hr, BFR 400, EDW 92kg -> needs to go further, 2K/2Ca, LUE AVF - Mircera 260mcg Iv q 2 weeks  Assessment/Plan: ESRD: Usual MWF schedule. Historically very non-compliant - better with hospitalization and incarceration. HD today, continue MWF schedule Volume/ hypertension: with severe LVH. Losing weight, will lower EDW and continue to challenge as tolerated. Adjusting meds per cardiology recommendations: d/c diltiazem, started low dose coreg - then increased, ^ iso/hydralazine. Remains on torsemide as well. Anemia of Chronic Kidney Disease: Hgb low. Given Aranesp 129mcg on 12/4. Recent 11/26 iron  sat 6%, giving IV iron load here. Given 1U PRBCs 12/7. Hgb up to 7.9 now. Secondary Hyperparathyroidism/ Hyperphosphatemia: Outpatient Phos 8.4, improved to 6.0 here Continue home phoslo and calcitriol Vascular access: LUE AVF with no issues Chest pain: Trops high but stable - likely related to demand - cardiology following. Volume removal with HD as tolerated.  Plan for outpatient stress test.  Severe TR: cardiology reports she is not a candidate for valve surgery Pericardial effusion: Cardiology following - consideration for pericardial drain, but unclear if she will agree to this and per cardiology not indicated at this time. Her effusion is likely related to chronic uremia and  overload - both only recently improved.   Anice Paganini, PA-C 11/11/2022, 8:51 AM  [facility] Pager: [phone]

## 2022-11-11 NOTE — Assessment & Plan Note (Signed)
Hypertensive urgency on admission, volume overloaded.  At the time of her discharge her blood pressure has improved.  Systolic 491 to 791 mmHg.   Plan to continue with carvedilol, hydralazine and isosorbide. Diuresis with torsemide, and ultrafiltration with HD.

## 2022-11-11 NOTE — Progress Notes (Signed)
Received patient in bed to unit.  Alert and oriented.  Informed consent signed and in chart.   Treatment initiated: 0921 Treatment completed: 1241  Patient tolerated well.  Transported back to the room  Alert, without acute distress.  Hand-off given to patient's nurse.   Access used: AVF Access issues: None  Total UF removed: 2.3L Medication(s) given: none Post HD VS126/84,110,26,97.6,98.% Post HD weight:82.1kg   [facility]

## 2022-11-11 NOTE — Assessment & Plan Note (Signed)
Depression.  Continue with trazodone, and aripiprazole

## 2022-11-16 ENCOUNTER — Emergency Department (HOSPITAL_COMMUNITY)
Admission: EM | Admit: 2022-11-16 | Discharge: 2022-11-16 | Disposition: A | Discharge status: Home / Self Care | Payer: Medicaid Other | Attending: Emergency Medicine | Admitting: Emergency Medicine

## 2022-11-16 ENCOUNTER — Emergency Department (HOSPITAL_COMMUNITY): Payer: Medicaid Other

## 2022-11-16 ENCOUNTER — Other Ambulatory Visit: Payer: Self-pay

## 2022-11-16 DIAGNOSIS — M79604 Pain in right leg: Secondary | ICD-10-CM | POA: Diagnosis not present

## 2022-11-16 DIAGNOSIS — I509 Heart failure, unspecified: Secondary | ICD-10-CM | POA: Diagnosis not present

## 2022-11-16 DIAGNOSIS — Z992 Dependence on renal dialysis: Secondary | ICD-10-CM | POA: Insufficient documentation

## 2022-11-16 DIAGNOSIS — J45909 Unspecified asthma, uncomplicated: Secondary | ICD-10-CM | POA: Insufficient documentation

## 2022-11-16 DIAGNOSIS — M25571 Pain in right ankle and joints of right foot: Secondary | ICD-10-CM | POA: Diagnosis not present

## 2022-11-16 DIAGNOSIS — I132 Hypertensive heart and chronic kidney disease with heart failure and with stage 5 chronic kidney disease, or end stage renal disease: Secondary | ICD-10-CM | POA: Diagnosis not present

## 2022-11-16 DIAGNOSIS — R5383 Other fatigue: Secondary | ICD-10-CM | POA: Diagnosis not present

## 2022-11-16 DIAGNOSIS — M79605 Pain in left leg: Secondary | ICD-10-CM | POA: Diagnosis not present

## 2022-11-16 DIAGNOSIS — N186 End stage renal disease: Secondary | ICD-10-CM | POA: Insufficient documentation

## 2022-11-16 DIAGNOSIS — Z79899 Other long term (current) drug therapy: Secondary | ICD-10-CM | POA: Diagnosis not present

## 2022-11-16 DIAGNOSIS — R0602 Shortness of breath: Secondary | ICD-10-CM | POA: Diagnosis not present

## 2022-11-16 DIAGNOSIS — M25572 Pain in left ankle and joints of left foot: Secondary | ICD-10-CM | POA: Insufficient documentation

## 2022-11-16 DIAGNOSIS — G8929 Other chronic pain: Secondary | ICD-10-CM | POA: Diagnosis not present

## 2022-11-16 LAB — CBC WITH DIFFERENTIAL/PLATELET
Abs Immature Granulocytes: 0.04 10*3/uL (ref 0.00–0.07)
Basophils Absolute: 0 10*3/uL (ref 0.0–0.1)
Basophils Relative: 0 %
Eosinophils Absolute: 0.1 10*3/uL (ref 0.0–0.5)
Eosinophils Relative: 1 %
HCT: 28 % — ABNORMAL LOW (ref 36.0–46.0)
Hemoglobin: 8.7 g/dL — ABNORMAL LOW (ref 12.0–15.0)
Immature Granulocytes: 1 %
Lymphocytes Relative: 17 %
Lymphs Abs: 1.4 10*3/uL (ref 0.7–4.0)
MCH: 29.1 pg (ref 26.0–34.0)
MCHC: 31.1 g/dL (ref 30.0–36.0)
MCV: 93.6 fL (ref 80.0–100.0)
Monocytes Absolute: 0.5 10*3/uL (ref 0.1–1.0)
Monocytes Relative: 6 %
Neutro Abs: 6.1 10*3/uL (ref 1.7–7.7)
Neutrophils Relative %: 75 %
Platelets: 295 10*3/uL (ref 150–400)
RBC: 2.99 MIL/uL — ABNORMAL LOW (ref 3.87–5.11)
RDW: 17.5 % — ABNORMAL HIGH (ref 11.5–15.5)
WBC: 8.1 10*3/uL (ref 4.0–10.5)
nRBC: 0 % (ref 0.0–0.2)

## 2022-11-16 LAB — COMPREHENSIVE METABOLIC PANEL
ALT: 15 U/L (ref 0–44)
AST: 21 U/L (ref 15–41)
Albumin: 2.7 g/dL — ABNORMAL LOW (ref 3.5–5.0)
Alkaline Phosphatase: 124 U/L (ref 38–126)
Anion gap: 17 — ABNORMAL HIGH (ref 5–15)
BUN: 78 mg/dL — ABNORMAL HIGH (ref 6–20)
CO2: 26 mmol/L (ref 22–32)
Calcium: 9.2 mg/dL (ref 8.9–10.3)
Chloride: 96 mmol/L — ABNORMAL LOW (ref 98–111)
Creatinine, Ser: 10.96 mg/dL — ABNORMAL HIGH (ref 0.44–1.00)
GFR, Estimated: 4 mL/min — ABNORMAL LOW (ref >60)
Glucose, Bld: 124 mg/dL — ABNORMAL HIGH (ref 70–99)
Potassium: 4.6 mmol/L (ref 3.5–5.1)
Sodium: 139 mmol/L (ref 135–145)
Total Bilirubin: 0.8 mg/dL (ref 0.3–1.2)
Total Protein: 7.4 g/dL (ref 6.5–8.1)

## 2022-11-16 LAB — I-STAT BETA HCG BLOOD, ED (MC, WL, AP ONLY): I-stat hCG, quantitative: <5 m[IU]/mL (ref <5)

## 2022-11-16 LAB — CBG MONITORING, ED: Glucose-Capillary: 124 mg/dL — ABNORMAL HIGH (ref 70–99)

## 2022-11-16 LAB — ETHANOL: Alcohol, Ethyl (B): <10 mg/dL (ref <10)

## 2022-11-16 NOTE — ED Triage Notes (Signed)
[place]. Bystander called EMS for possible OD. Pt denies drug use. Lethargic, arousable, A&Ox4, c/o body pain, VSS.

## 2022-11-16 NOTE — ED Notes (Signed)
Pt removed [place], o2 is 97%

## 2022-11-16 NOTE — Discharge Instructions (Signed)
Your history, exam and workup today did not reveal evidence of acute fracture causing her ankle pains.  I suspect some of the fluid shifts you get with your dialysis led to more waxing waning swelling and leg pains.  The chest x-ray did not show new pneumonia and appeared similar to prior.  Your brief transient hypoxia was more when you are laying flat and now that you are awake and eating and drinking well you are not hypoxic.  We initially discussed admission for possible dialysis however as you have dialysis tomorrow and have resolution of symptoms he feels reasonable to discharge home.  Case management was involved and we recommend following up with both sports medicine and PCP for ongoing chronic pain troubles.  If any symptoms change or worsen acutely, please turn to the nearest emergency department.

## 2022-11-16 NOTE — ED Triage Notes (Signed)
Patient arrived with EMS from street ( homeless) reports generalized body aches this morning .

## 2022-11-16 NOTE — ED Provider Notes (Signed)
[facility] EMERGENCY DEPARTMENT Provider Note   CSN: [id number] Arrival date & time: 11/16/22  0741     History  Chief Complaint  Patient presents with   Fatigue    Natasha Long is a 26 y.o. female.  The history is provided by the patient and medical records. No language interpreter was used.  Illness Location:  Generalized fatigue and bilateral ankle pain Severity:  Moderate Onset quality:  Unable to specify Timing:  Unable to specify Progression:  Unchanged Chronicity:  Chronic Associated symptoms: fatigue and shortness of breath (per mt mild)   Associated symptoms: no abdominal pain, no chest pain, no congestion, no cough, no diarrhea, no fever, no headaches, no loss of consciousness, no nausea, no rash, no rhinorrhea, no vomiting and no wheezing        Home Medications Prior to Admission medications   Medication Sig Start Date End Date Taking? Authorizing Provider  acetaminophen (TYLENOL) 325 MG tablet Take 2 tablets (650 mg total) by mouth every 6 (six) hours as needed for mild pain (or Fever >/= 101). 11/11/22   Arrien, [name], MD  ARIPiprazole (ABILIFY) 5 MG tablet Take 1.5 tablets (7.5 mg total) by mouth daily. 09/02/22   Rai, Ripudeep K, MD  b complex-vitamin c-folic acid (NEPHRO-VITE) 0.8 MG TABS tablet Take 1 tablet by mouth daily. 09/02/22   Rai, Vernelle Emerald, MD  calcitRIOL (ROCALTROL) 0.25 MCG capsule Take 1 capsule (0.25 mcg total) by mouth Every Tuesday,Thursday,and Saturday with dialysis. Patient taking differently: Take 0.25 mcg by mouth 3 (three) times a week. 09/03/22   Rai, Vernelle Emerald, MD  calcium acetate (PHOSLO) 667 MG capsule Take 1 capsule (667 mg total) by mouth 3 (three) times daily with meals. 09/02/22   Rai, Vernelle Emerald, MD  carvedilol (COREG) 6.25 MG tablet Take 1 tablet (6.25 mg total) by mouth 2 (two) times daily with a meal. 11/11/22 12/11/22  Arrien, [name], MD  dicyclomine (BENTYL) 20 MG tablet Take 1  tablet (20 mg total) by mouth every 6 (six) hours as needed for spasms (abdominal cramping). 09/02/22   Rai, Vernelle Emerald, MD  hydrALAZINE (APRESOLINE) 50 MG tablet Take 1 tablet (50 mg total) by mouth every 8 (eight) hours. 11/11/22 12/11/22  Arrien, [name], MD  hydrOXYzine (VISTARIL) 25 MG capsule Take 1 capsule (25 mg total) by mouth every 8 (eight) hours as needed for anxiety. Patient taking differently: Take 50 mg by mouth at bedtime. 09/02/22   Rai, Vernelle Emerald, MD  isosorbide mononitrate (IMDUR) 60 MG 24 hr tablet Take 1 tablet (60 mg total) by mouth daily. 11/11/22 12/11/22  Arrien, [name], MD  methocarbamol (ROBAXIN) 500 MG tablet Take 1 tablet (500 mg total) by mouth every 8 (eight) hours as needed for muscle spasms. 09/02/22   Rai, Vernelle Emerald, MD  sevelamer carbonate (RENVELA) 800 MG tablet Take 2 tablets (1,600 mg total) by mouth 3 (three) times daily with meals. 09/02/22   Rai, Ripudeep Raliegh Ip, MD  torsemide (DEMADEX) 100 MG tablet Take 1 tablet (100 mg total) by mouth every morning. 09/02/22   Rai, Vernelle Emerald, MD      Allergies    Bupropion, Prozac [fluoxetine hcl], and Prednisone    Review of Systems   Review of Systems  Constitutional:  Positive for fatigue. Negative for chills, diaphoresis and fever.  HENT:  Negative for congestion and rhinorrhea.   Respiratory:  Positive for shortness of breath (per mt mild). Negative for cough, chest tightness and wheezing.  Cardiovascular:  Positive for leg swelling. Negative for chest pain and palpitations.  Gastrointestinal:  Negative for abdominal pain, constipation, diarrhea, nausea and vomiting.  Genitourinary:  Negative for dysuria, flank pain and frequency.  Musculoskeletal:  Negative for back pain, neck pain and neck stiffness.  Skin:  Negative for rash and wound.  Neurological:  Negative for loss of consciousness, weakness, light-headedness and headaches.  Psychiatric/Behavioral:  Negative for agitation and confusion.   All  other systems reviewed and are negative.   Physical Exam Updated Vital Signs BP (!) 138/90   Pulse (!) 103   Temp 98.1 F (36.7 C)   Resp 17   SpO2 96%  Physical Exam Vitals and nursing note reviewed.  Constitutional:      General: She is not in acute distress.    Appearance: She is well-developed. She is not ill-appearing, toxic-appearing or diaphoretic.  HENT:     Head: Normocephalic and atraumatic.     Nose: No congestion or rhinorrhea.     Mouth/Throat:     Mouth: Mucous membranes are moist.     Pharynx: No oropharyngeal exudate or posterior oropharyngeal erythema.  Eyes:     Extraocular Movements: Extraocular movements intact.     Conjunctiva/sclera: Conjunctivae normal.     Pupils: Pupils are equal, round, and reactive to light.  Cardiovascular:     Rate and Rhythm: Normal rate and regular rhythm.     Heart sounds: No murmur heard. Pulmonary:     Effort: Pulmonary effort is normal. No respiratory distress.     Breath sounds: Rales present. No wheezing or rhonchi.  Chest:     Chest wall: No tenderness.  Abdominal:     General: Abdomen is flat.     Palpations: Abdomen is soft.     Tenderness: There is no abdominal tenderness. There is no right CVA tenderness, left CVA tenderness, guarding or rebound.  Musculoskeletal:        General: No swelling, tenderness or signs of injury.     Cervical back: Neck supple. No tenderness.     Right lower leg: Edema present.     Left lower leg: Edema present.  Skin:    General: Skin is warm and dry.     Capillary Refill: Capillary refill takes less than 2 seconds.     Findings: No erythema or rash.  Neurological:     General: No focal deficit present.     Mental Status: She is alert.     Sensory: No sensory deficit.     Motor: No weakness.  Psychiatric:        Mood and Affect: Mood normal.     ED Results / Procedures / Treatments   Labs (all labs ordered are listed, but only abnormal results are displayed) Labs Reviewed   CBC WITH DIFFERENTIAL/PLATELET - Abnormal; Notable for the following components:      Result Value   RBC 2.99 (*)    Hemoglobin 8.7 (*)    HCT 28.0 (*)    RDW 17.5 (*)    All other components within normal limits  COMPREHENSIVE METABOLIC PANEL - Abnormal; Notable for the following components:   Chloride 96 (*)    Glucose, Bld 124 (*)    BUN 78 (*)    Creatinine, Ser 10.96 (*)    Albumin 2.7 (*)    GFR, Estimated 4 (*)    Anion gap 17 (*)    All other components within normal limits  CBG MONITORING, ED - Abnormal; Notable  for the following components:   Glucose-Capillary 124 (*)    All other components within normal limits  ETHANOL  RAPID URINE DRUG SCREEN, HOSP PERFORMED  I-STAT BETA HCG BLOOD, ED ([facility], [facility], [facility] ONLY)    EKG None  Radiology DG Chest 2 View  Result Date: 11/16/2022 CLINICAL DATA:  Hypoxia, exertional dyspnea, fatigue, LEFT side chest pain, symptoms for 1 day, history hypertension, CHF, end-stage renal disease, smoker EXAM: CHEST - 2 VIEW COMPARISON:  11/01/2022 FINDINGS: Enlargement of cardiac silhouette with pulmonary vascular congestion. Bibasilar effusions and atelectasis. No definite pulmonary edema or segmental consolidation identified. No pneumothorax. Osseous structures unremarkable. IMPRESSION: Enlargement of cardiac silhouette with pulmonary vascular congestion. Bibasilar pleural effusions and atelectasis again seen. Electronically Signed   By: Lavonia Dana M.D.   On: 11/16/2022 11:28   DG Ankle Complete Left  Result Date: 11/16/2022 CLINICAL DATA:  Pain EXAM: LEFT ANKLE COMPLETE - 3 VIEW; RIGHT ANKLE - COMPLETE 3 VIEW COMPARISON:  None Available. FINDINGS: There is no evidence of acute fracture, dislocation, or joint effusion. Well corticated ossification at the medial malleolus on the right may be related to prior trauma. There is no evidence of arthropathy or other focal bone abnormality. There is significant soft tissue swelling identified laterally on  the left. IMPRESSION: Chronic posttraumatic changes right distal tibia. Soft tissue swelling lateral malleolus on the left. No acute osseous abnormalities. Electronically Signed   By: Sammie Bench M.D.   On: 11/16/2022 08:37   DG Ankle Complete Right  Result Date: 11/16/2022 CLINICAL DATA:  Pain EXAM: LEFT ANKLE COMPLETE - 3 VIEW; RIGHT ANKLE - COMPLETE 3 VIEW COMPARISON:  None Available. FINDINGS: There is no evidence of acute fracture, dislocation, or joint effusion. Well corticated ossification at the medial malleolus on the right may be related to prior trauma. There is no evidence of arthropathy or other focal bone abnormality. There is significant soft tissue swelling identified laterally on the left. IMPRESSION: Chronic posttraumatic changes right distal tibia. Soft tissue swelling lateral malleolus on the left. No acute osseous abnormalities. Electronically Signed   By: Sammie Bench M.D.   On: 11/16/2022 08:37    Procedures Procedures    Medications Ordered in ED Medications - No data to display   ED Course/ Medical Decision Making/ A&P                           Medical Decision Making Amount and/or Complexity of Data Reviewed Labs: ordered. Radiology: ordered.    Lashonna Figuero is a 26 y.o. female with a past medical history significant for ESRD on dialysis, CHF, asthma, PTSD, documentation of borderline personality disorder, and documentation of major depression who presents for bilateral ankle pain and somnolence.  According to patient, she is very sleepy but is only complaining of pain in her legs.  She to me denies fevers, chills, chest pain, shortness of breath, nausea, vomiting, confusion, diarrhea, or urinary problems.  She is just complaining of pain in her ankles.  She denies trauma to her knowledge.  Documentation shows that patient checked in earlier this evening and ended up leaving agitated without getting more extensive workup based on notes.  Patient  denies taking any drugs, alcohol or medications and is somnolent.  On my exam, lungs were clear and chest was nontender.  Abdomen is tender.  Back nontender.  Patient is somnolent but will wake up and follow commands.  Intact strength and sensation in extremities.  Patient  had symmetric grip strength in upper extremities.  Legs could move however she had tenderness in her ankles bilaterally.  Left ankle was slightly swollen compared to right.  Pupils are symmetric and reactive although patient did not want to open her eyes for me.  Patient does not wanting to answer questions but with loud voice and painful stimuli patient will wake up and answer questions.  She will mumble answers at times but is unsure if she missed any dialysis.  She reports she gets it Monday Wednesday Friday.  Will get x-ray of the ankles where she is hurting.  Otherwise patient is denying symptoms.  Due to the somnolence, will get some screening labs to make sure she does not have significant electrolyte or glucose abnormalities.  Will get an EKG with her somnolence and possible missing dialysis and we will also make sure she is not profoundly anemic as I see a history of anemia in her chart.  Anticipate reassessment after workup to determine disposition.  Due to the altered mental status will not give pain medicine at this time.  10:49 AM Reassessment she now tells me she takes dialysis Tuesday, Thursday, Saturday and has not missed any treatments.  She says that she does have some more shortness of breath with exertion and she does think she is holding onto extra fluid.  Her x-rays of the ankle did not show any acute fracture.  However, on reassessment her oxygen saturations have dropped to 83% on room air.  She reports he does not take oxygen at home but does sometimes and she is in the hospital.  On reassessment she does have some rales in the bases of her lungs initially did not auscultate.  Patient does think that she has  fluid in her lungs will get a chest x-ray, she is now on 2 L nasal cannula to maintain oxygen saturation in the 90s, and will monitor.  When workup is completed, patient may need admission and dialysis for hypoxia.  After x-ray and labs we will likely talk to nephrology team to discuss a plan for either dialysis and discharge versus dialysis on admission.  11:01 AM Patient now just wanting a sandwich.  She will be given food.  She pulled out her nasal cannula and says she is not short of breath now.  Oxygen saturations are in the 90s.  After p.o. challenge and labs we will reassess to determine if she needs nephrology consultation versus discharge home to get dialysis tomorrow.  11:08 AM Patient now says she does not want to get dialysis today and really does go on Wednesday.  She is now sitting up and is not short of breath.  She would like to talk to case management about what assistance from housing she can get.  Will have transition of care team come see her but anticipate discharge now that she is proving stability without hypoxia.  1:54 PM Patient has proven further stability and has no complaints now.  She is agreeable to taking over-the-counter Tylenol at home and going to her dialysis tomorrow.  She initially was asking about placement.  Facility however we reported that her resolution of shortness of breath and hypoxia resolution meant she likely does not need admission to the hospital at this time and she agrees.  We also felt that the x-rays of her ankle did not show acute fracture and patient agrees to no new trauma.  She will be given his first follow-up with sports medicine and she will  follow with her PCP to discuss if she needs outpatient rehab facility for chronic pains.  She agreed with plan of care and asked for shoes and bus pass and she will be discharged.  Patient with questions or concerns and was discharged in stable condition.        Final Clinical Impression(s) / ED  Diagnoses Final diagnoses:  Fatigue, unspecified type  Chronic pain of both lower extremities    Rx / DC Orders ED Discharge Orders     None       Clinical Impression: 1. Fatigue, unspecified type   2. Chronic pain of both lower extremities     Disposition: Discharge  Condition: Good  I have discussed the results, Dx and Tx plan with the pt(& family if present). He/she/they expressed understanding and agree(s) with the plan. Discharge instructions discussed at great length. Strict return precautions discussed and pt &/or family have verbalized understanding of the instructions. No further questions at time of discharge.    New Prescriptions   No medications on file    Follow Up: Rosemarie Ax, MD [place] [address] [place] [address] [id number] mc [place]    [name], [place] [address] [place] [address] [phone]        Zsazsa Bahena, Gwenyth Allegra, MD 11/16/22 1359

## 2022-11-16 NOTE — ED Provider Notes (Signed)
  [facility] Emergency Department Provider Note MRN:  [id number]  Arrival date & time: 11/16/22     Chief Complaint   Generalized Pain    History of Present Illness   Natasha Long is a 26 y.o. year-old female presents to the ED with chief complaint of leg pain.  Hx of dialysis.  Denies injury to her legs.  Denies SOB.  States that he legs hurt after taking off her shoes..  History provided by patient.   Review of Systems  Pertinent positive and negative review of systems noted in HPI.    Physical Exam   Vitals:   11/16/22 0519  BP: (!) 120/90  Pulse: 87  Resp: 17  Temp: 98.6 F (37 C)  SpO2: 98%    CONSTITUTIONAL:  non toxic-appearing, NAD NEURO:  Alert and oriented x 3, CN 3-12 grossly intact EYES:  eyes equal and reactive ENT/NECK:  Supple, no stridor  CARDIO:  appears well-perfused  PULM:  No respiratory distress,  GI/GU:  non-distended,  MSK/SPINE:  No gross deformities, moderate LE edema, moves all extremities  SKIN:  no rash, atraumatic   *Additional and/or pertinent findings included in MDM below  Diagnostic and Interventional Summary     Labs Reviewed  CBC WITH DIFFERENTIAL/PLATELET  BASIC METABOLIC PANEL  BRAIN NATRIURETIC PEPTIDE  TROPONIN I (HIGH SENSITIVITY)    DG Chest 2 View    (Results Pending)    Medications - No data to display   Procedures  /  Critical Care Procedures  ED Course and Medical Decision Making  I have reviewed the triage vital signs, the nursing notes, and pertinent available records from the EMR.  Social Determinants Affecting Complexity of Care: Patient is homelessness.   ED Course:    Medical Decision Making Patient presented for leg pain.  Hx of homelessness.  Patient immediately became very aggressive and hostile toward staff.  She was screaming in the room, yelling at staff to get out of room.  I told patient that she needed to calm down and let staff do their job, or it would be  seen as a refusal of care.  She continued her outbursts.  Security was called and patient was escorted out.    Amount and/or Complexity of Data Reviewed Labs: ordered. Radiology: ordered. ECG/medicine tests: ordered.    Vitals:   11/16/22 0519  BP: (!) 120/90  Pulse: 87  Resp: 17  Temp: 98.6 F (37 C)  SpO2: 98%     Consultants: No consultations were needed in caring for this patient.   Treatment and Plan: Emergency department workup does not suggest an emergent condition requiring admission or immediate intervention beyond  what has been performed at this time. The patient is safe for discharge and has  been instructed to return immediately for worsening symptoms, change in  symptoms or any other concerns    Final Clinical Impressions(s) / ED Diagnoses     ICD-10-CM   1. Pain in both lower extremities  M79.604    M79.605       ED Discharge Orders     None         Discharge Instructions Discussed with and Provided to Patient:    Discharge Instructions      Please follow-up with your doctor for your pain symptoms.      Montine Circle, PA-C 11/16/22 6767    Merryl Hacker, MD 11/17/22 [phone]

## 2022-11-16 NOTE — Discharge Instructions (Signed)
Please follow-up with your doctor for your pain symptoms.

## 2022-11-16 NOTE — ED Notes (Signed)
While patient was resting, her 02 dropped to 84%, patient placed on [facility], Dr Tegeler at bedside, reevaluating patient.

## 2022-11-16 NOTE — ED Notes (Signed)
Pt now awake, sitting up, o2 improved, patient provided meal per provider.

## 2022-11-16 NOTE — ED Notes (Signed)
PT able to ambulate without assistance.

## 2022-11-19 ENCOUNTER — Other Ambulatory Visit (HOSPITAL_COMMUNITY): Payer: Self-pay

## 2022-11-19 ENCOUNTER — Other Ambulatory Visit: Payer: Self-pay

## 2022-11-24 ENCOUNTER — Other Ambulatory Visit (HOSPITAL_COMMUNITY)

## 2022-11-26 ENCOUNTER — Other Ambulatory Visit: Payer: Self-pay

## 2022-11-26 ENCOUNTER — Emergency Department (HOSPITAL_COMMUNITY)
Admission: EM | Admit: 2022-11-26 | Discharge: 2022-11-27 | Disposition: A | Discharge status: Home / Self Care | Payer: Medicaid Other | Attending: Emergency Medicine | Admitting: Emergency Medicine

## 2022-11-26 ENCOUNTER — Emergency Department (HOSPITAL_COMMUNITY): Payer: Medicaid Other

## 2022-11-26 DIAGNOSIS — I120 Hypertensive chronic kidney disease with stage 5 chronic kidney disease or end stage renal disease: Secondary | ICD-10-CM | POA: Insufficient documentation

## 2022-11-26 DIAGNOSIS — J45909 Unspecified asthma, uncomplicated: Secondary | ICD-10-CM | POA: Diagnosis not present

## 2022-11-26 DIAGNOSIS — M79604 Pain in right leg: Secondary | ICD-10-CM | POA: Insufficient documentation

## 2022-11-26 DIAGNOSIS — Z992 Dependence on renal dialysis: Secondary | ICD-10-CM | POA: Diagnosis not present

## 2022-11-26 DIAGNOSIS — M79605 Pain in left leg: Secondary | ICD-10-CM | POA: Insufficient documentation

## 2022-11-26 DIAGNOSIS — N186 End stage renal disease: Secondary | ICD-10-CM | POA: Diagnosis not present

## 2022-11-26 DIAGNOSIS — E875 Hyperkalemia: Secondary | ICD-10-CM | POA: Insufficient documentation

## 2022-11-26 DIAGNOSIS — R2243 Localized swelling, mass and lump, lower limb, bilateral: Secondary | ICD-10-CM | POA: Diagnosis not present

## 2022-11-26 DIAGNOSIS — I509 Heart failure, unspecified: Secondary | ICD-10-CM | POA: Insufficient documentation

## 2022-11-26 LAB — BASIC METABOLIC PANEL
Anion gap: 31 — ABNORMAL HIGH (ref 5–15)
BUN: 168 mg/dL — ABNORMAL HIGH (ref 6–20)
CO2: 10 mmol/L — ABNORMAL LOW (ref 22–32)
Calcium: 9.4 mg/dL (ref 8.9–10.3)
Chloride: 95 mmol/L — ABNORMAL LOW (ref 98–111)
Creatinine, Ser: 19.77 mg/dL — ABNORMAL HIGH (ref 0.44–1.00)
GFR, Estimated: 2 mL/min — ABNORMAL LOW (ref >60)
Glucose, Bld: 120 mg/dL — ABNORMAL HIGH (ref 70–99)
Potassium: >7.5 mmol/L (ref 3.5–5.1)
Sodium: 136 mmol/L (ref 135–145)

## 2022-11-26 LAB — CBC WITH DIFFERENTIAL/PLATELET
Abs Immature Granulocytes: 0.02 10*3/uL (ref 0.00–0.07)
Basophils Absolute: 0 10*3/uL (ref 0.0–0.1)
Basophils Relative: 0 %
Eosinophils Absolute: 0 10*3/uL (ref 0.0–0.5)
Eosinophils Relative: 0 %
HCT: 32.3 % — ABNORMAL LOW (ref 36.0–46.0)
Hemoglobin: 9.6 g/dL — ABNORMAL LOW (ref 12.0–15.0)
Immature Granulocytes: 0 %
Lymphocytes Relative: 11 %
Lymphs Abs: 1.2 10*3/uL (ref 0.7–4.0)
MCH: 29 pg (ref 26.0–34.0)
MCHC: 29.7 g/dL — ABNORMAL LOW (ref 30.0–36.0)
MCV: 97.6 fL (ref 80.0–100.0)
Monocytes Absolute: 0.6 10*3/uL (ref 0.1–1.0)
Monocytes Relative: 5 %
Neutro Abs: 9.1 10*3/uL — ABNORMAL HIGH (ref 1.7–7.7)
Neutrophils Relative %: 84 %
Platelets: 178 10*3/uL (ref 150–400)
RBC: 3.31 MIL/uL — ABNORMAL LOW (ref 3.87–5.11)
RDW: 21.8 % — ABNORMAL HIGH (ref 11.5–15.5)
WBC: 10.9 10*3/uL — ABNORMAL HIGH (ref 4.0–10.5)
nRBC: 0.2 % (ref 0.0–0.2)

## 2022-11-26 LAB — CK: Total CK: 185 U/L (ref 38–234)

## 2022-11-26 LAB — HCG, SERUM, QUALITATIVE: Preg, Serum: NEGATIVE

## 2022-11-26 MED ORDER — ALBUTEROL SULFATE (2.5 MG/3ML) 0.083% IN NEBU
10.0000 mg | INHALATION_SOLUTION | Freq: Once | RESPIRATORY_TRACT | Status: DC
  Filled 2022-11-26: qty 12

## 2022-11-26 MED ORDER — LIDOCAINE-PRILOCAINE 2.5-2.5 % EX CREA: 1.0000 | TOPICAL_CREAM | CUTANEOUS | Status: DC | PRN

## 2022-11-26 MED ORDER — HEPARIN SODIUM (PORCINE) 1000 UNIT/ML DIALYSIS
2000.0000 [IU] | Freq: Once | INTRAMUSCULAR | Status: AC
  Administered 2022-11-26: 2000 [IU] via INTRAVENOUS_CENTRAL

## 2022-11-26 MED ORDER — SODIUM ZIRCONIUM CYCLOSILICATE 10 G PO PACK
10.0000 g | PACK | Freq: Once | ORAL | Status: AC
  Administered 2022-11-26: 10 g via ORAL
  Filled 2022-11-26: qty 1

## 2022-11-26 MED ORDER — HEPARIN SODIUM (PORCINE) 1000 UNIT/ML IJ SOLN
INTRAMUSCULAR | Status: AC
  Filled 2022-11-26: qty 3

## 2022-11-26 MED ORDER — CHLORHEXIDINE GLUCONATE CLOTH 2 % EX PADS: 6.0000 | MEDICATED_PAD | Freq: Every day | CUTANEOUS | Status: DC

## 2022-11-26 MED ORDER — PENTAFLUOROPROP-TETRAFLUOROETH EX AERO: 1.0000 | INHALATION_SPRAY | CUTANEOUS | Status: DC | PRN

## 2022-11-26 MED ORDER — FUROSEMIDE 10 MG/ML IJ SOLN
120.0000 mg | Freq: Once | INTRAVENOUS | Status: DC
  Filled 2022-11-26: qty 12

## 2022-11-26 MED ORDER — CALCIUM GLUCONATE-NACL 2-0.675 GM/100ML-% IV SOLN
2.0000 g | Freq: Once | INTRAVENOUS | Status: AC
  Administered 2022-11-26: 2000 mg via INTRAVENOUS
  Filled 2022-11-26: qty 100

## 2022-11-26 MED ORDER — AMMONIA AROMATIC IN INHA
RESPIRATORY_TRACT | Status: AC
  Filled 2022-11-26: qty 10

## 2022-11-26 MED ORDER — LIDOCAINE HCL (PF) 1 % IJ SOLN: 5.0000 mL | INTRAMUSCULAR | Status: DC | PRN

## 2022-11-26 NOTE — Procedures (Signed)
Patient seen and examined on Hemodialysis. BP (!) 165/117 (BP Location: Left Arm)   Pulse 84   Temp 97.7 F (36.5 C) (Oral)   Resp 18   SpO2 100%   QB 400 mL/ min via AVF UF goal 3L  Tolerating treatment without complaints at this time.  Given warm blankets.   Madelon Lips MD [facility] [phone] 8:10 PM

## 2022-11-26 NOTE — ED Notes (Signed)
Pt screaming in pain saying they want to go and lay down. This NT elevated their feet on a chair and let them know as soon a recliner is available we will try and get them one.

## 2022-11-26 NOTE — ED Triage Notes (Signed)
Patient arrived with EMS reports chronic bilateral leg pain worse this week , no hemodialysis treatment for several weeks / non compliant with her medications .

## 2022-11-26 NOTE — ED Notes (Signed)
Patient leaving for dialysis in stable condition, is yelling saying she is in pain but then falls back asleep.

## 2022-11-26 NOTE — ED Notes (Signed)
Patient is screaming extremely loud making moaning noises. Patient told we would do our best to get her back. Patient continued.

## 2022-11-26 NOTE — Consult Note (Signed)
Reason for Consult: To manage dialysis and dialysis related needs Referring Physician: Dr Achilles Dunk is an 26 y.o. female.  HPI: Pt is a 68F with a PMH sig for ESRD on HD MWF, BPD, PTSD, poor SDOH who is now seen in consultation at the request of Dr. Maryan Rued for management of ESRD and provision of HD.    Pt presented to [facility] reporting LE edema and SOB.  Hasn't had dialysis since 12/15.  Presented to ED 12/18 but declined HD treatment at that time per ED staff notes.  Progressive swelling and SOB. In ED labs reflective of not being dialyzed for a long time.  K > 7.5, BUN 168, Cr 19.77.  Got IV Lasix/ albuterol/ Lokelma. In this setting we are asked to see.     Dialyzes at [facility] MWF 4 hrs F180 BFR 400 DFR 800 EDW 82.9 kg 2K 2 Ca bath AVF 5000 u bolus heparin Mircera 200 mcg q 2 weeks Calcitriol 0.5 mcg TIW   Past Medical History:  Diagnosis Date   Asthma    as a child, no problem as an adult, no inhaler   CHF (congestive heart failure) (HCC)    Complication of anesthesia    woke up before tube removed, 1 time fought nurses   ESRD on hemodialysis [facility])    M-W-F   History of borderline personality disorder    Hypertension    diagnosed as child; stopped meds at 21 yo   Insomnia    Neuromuscular disorder ([place])    peripheral neuropathy   Nonspecific chest pain    PTSD (post-traumatic stress disorder)     Past Surgical History:  Procedure Laterality Date   AV FISTULA PLACEMENT Left 10/18/2020   Procedure: LEFT ARM ARTERIOVENOUS (AV) FISTULA CREATION;  Surgeon: Serafina Mitchell, MD;  Location: [facility] OR;  Service: Vascular;  Laterality: Left;   CHOLECYSTECTOMY     extraction of wisdom teeth     FISTULA SUPERFICIALIZATION Left 02/13/2021   Procedure: LEFT BRACHIOCEPHALIC ARTERIOVENOUS FISTULA SUPERFICIALIZATION;  Surgeon: Serafina Mitchell, MD;  Location: [facility];  Service: Vascular;  Laterality: Left;   I & D EXTREMITY Left 01/30/2022   Procedure: IRRIGATION AND DEBRIDEMENT  EXTREMITY;  Surgeon: Milly Jakob, MD;  Location: [facility];  Service: Orthopedics;  Laterality: Left;   INCISION AND DRAINAGE OF WOUND Left 01/30/2022   Procedure: LEFT WRIST ASPIRATION;  Surgeon: Vanetta Mulders, MD;  Location: [place];  Service: Orthopedics;  Laterality: Left;   KNEE ARTHROSCOPY Right 01/30/2022   Procedure: ARTHROSCOPY KNEE AND IRRIIGATION AND DEBRIDMENT; LEFT WRIST ASPIRATION;  Surgeon: Vanetta Mulders, MD;  Location: [facility];  Service: Orthopedics;  Laterality: Right;   RENAL BIOPSY     x 2   TUNNELED VENOUS CATHETER PLACEMENT  02/11/2021   [facility]    Family History  Adopted: Yes  Problem Relation Age of Onset   Diabetes Other    Hypertension Other     Social History:  reports that she has been smoking cigarettes. She has a 5.00 pack-year smoking history. She has never used smokeless tobacco. She reports that she does not currently use alcohol. She reports current drug use. Drugs: Marijuana and Cocaine.  Allergies:  Allergies  Allergen Reactions   Bupropion Anxiety, Other (See Comments) and Nausea And Vomiting    Caused panic attacks  Caused panic attacks    "Adverse effects"   Prozac [Fluoxetine Hcl] Anxiety and Other (See Comments)    Caused panic attacks  Prednisone Other (See Comments)    Pt stated this med caused pancreatitis    Medications: Scheduled:  albuterol  10 mg Nebulization Once   ammonia       [START ON 11/27/2022] Chlorhexidine Gluconate Cloth  6 each Topical Q0600   heparin  2,000 Units Dialysis Once in dialysis    Results for orders placed or performed during the hospital encounter of 11/26/22 (from the past 48 hour(s))  Basic metabolic panel     Status: Abnormal   Collection Time: 11/26/22  4:42 PM  Result Value Ref Range   Sodium 136 135 - 145 mmol/L   Potassium >7.5 (HH) 3.5 - 5.1 mmol/L    Comment: HEMOLYSIS AT THIS LEVEL MAY AFFECT RESULT CRITICAL RESULT CALLED TO, READ BACK BY AND VERIFIED WITH Roland Earl RN 11/26/2022  1850 BNUNNERY SLIGHTLY HEMOLYZED    Chloride 95 (L) 98 - 111 mmol/L   CO2 10 (L) 22 - 32 mmol/L   Glucose, Bld 120 (H) 70 - 99 mg/dL    Comment: Glucose reference range applies only to samples taken after fasting for at least 8 hours.   BUN 168 (H) 6 - 20 mg/dL   Creatinine, Ser 19.77 (H) 0.44 - 1.00 mg/dL   Calcium 9.4 8.9 - 10.3 mg/dL   GFR, Estimated 2 (L) >60 mL/min    Comment: (NOTE) Calculated using the CKD-EPI Creatinine Equation (2021)    Anion gap 31 (H) 5 - 15    Comment: ELECTROLYTES REPEATED TO VERIFY Performed at [facility] [address], [place], [place] [address]   CK     Status: None   Collection Time: 11/26/22  4:42 PM  Result Value Ref Range   Total CK 185 38 - 234 U/L    Comment: HEMOLYSIS AT THIS LEVEL MAY AFFECT RESULT Performed at [facility], [place] [address], [place], [place] [address]   hCG, serum, qualitative     Status: None   Collection Time: 11/26/22  4:42 PM  Result Value Ref Range   Preg, Serum NEGATIVE NEGATIVE    Comment:        THE SENSITIVITY OF THIS METHODOLOGY IS >10 mIU/mL. Performed at [facility], [place] [address] [place], [place] [address]   CBC with Differential/Platelet     Status: Abnormal   Collection Time: 11/26/22  4:42 PM  Result Value Ref Range   WBC 10.9 (H) 4.0 - 10.5 K/uL   RBC 3.31 (L) 3.87 - 5.11 MIL/uL   Hemoglobin 9.6 (L) 12.0 - 15.0 g/dL   HCT 32.3 (L) 36.0 - 46.0 %   MCV 97.6 80.0 - 100.0 fL   MCH 29.0 26.0 - 34.0 pg   MCHC 29.7 (L) 30.0 - 36.0 g/dL   RDW 21.8 (H) 11.5 - 15.5 %   Platelets 178 150 - 400 K/uL   nRBC 0.2 0.0 - 0.2 %   Neutrophils Relative % 84 %   Neutro Abs 9.1 (H) 1.7 - 7.7 K/uL   Lymphocytes Relative 11 %   Lymphs Abs 1.2 0.7 - 4.0 K/uL   Monocytes Relative 5 %   Monocytes Absolute 0.6 0.1 - 1.0 K/uL   Eosinophils Relative 0 %   Eosinophils Absolute 0.0 0.0 - 0.5 K/uL   Basophils Relative 0 %   Basophils Absolute 0.0 0.0 - 0.1 K/uL   Immature Granulocytes 0 %   Abs  Immature Granulocytes 0.02 0.00 - 0.07 K/uL    Comment: Performed at [facility], [place] [address], [place], [place] [address]    DG Chest 2 View  Result Date: 11/26/2022 CLINICAL DATA:  Missed dialysis. Shortness of breath. EXAM: CHEST - 2 VIEW COMPARISON:  11/16/2022 FINDINGS: Stable cardiac enlargement. Persistent bilateral pleural effusions and overlying bibasilar atelectasis. Mild central vascular congestion but no overt pulmonary edema. IMPRESSION: Persistent cardiac enlargement, bilateral pleural effusions and overlying bibasilar atelectasis. Electronically Signed   By: Marijo Sanes M.D.   On: 11/26/2022 06:51    ROS: all other systems reviewed and are negative except as per HPI Blood pressure (!) 162/114, pulse 84, temperature 97.6 F (36.4 C), temperature source Oral, resp. rate 20, SpO2 99 %.  GEN sitting up, NAD HEENT EOMI PERRL NECK no JVD PULM clear CV RRR ABD soft EXT 2+ LE edema, tight, somewhat tender NEURO AAO x 3  Assessment/Plan: 1 SOB/ LE edema/ hyperkalemia: d/t underdialysis- will provide emergent dialysis tonight.  Will likely need another treatment the following day/ Saturday 2 ESRD: Normally MWF.  Last OP HD rx 12/15.  Lower BFR to 300 and rx time to 3 hrs to prevent dialysis disequilibrium.  1K bath for 1 hr and 2K bath for 2 hrs. 3 Hypertension: Hypertensive.  Expecting to improve with UF 4. Anemia of ESRD: Unclear when last ESA was.  Will give mircera 200 mg here 5. Metabolic Bone Disease: Will give calcitriol 0.5 mcg here 6.  Nutrition: renal diet 7.  Dispo: anticipating admission  Madelon Lips 11/26/2022, 7:09 PM

## 2022-11-26 NOTE — ED Provider Triage Note (Addendum)
Emergency Medicine Provider Triage Evaluation Note  Natasha Long , a 26 y.o. female  was evaluated in triage.  Pt complains of lower extremity pain.  No swelling.  No chest pain or shortness of breath.  States she stopped taking her medications 2 weeks ago and has not been to dialysis in over 2 weeks.  No recent falls or injuries.  No numbness or weakness.  Difficult historian  Seen here in ED 12/18 for similar complaints  Review of Systems  Positive: BIL leg pain, missed dialysis Negative: CP, SOB, abd pain  Physical Exam  BP (!) 128/104 (BP Location: Right Arm)   Pulse 92   Temp 98.2 F (36.8 C)   Resp (!) 22   SpO2 100%  Gen:   Awake, no distress   Resp:  Normal effort  MSK:   Moves extremities without difficulty, Dialysis graft to LUE  Other:  Does not cooperate with exam  Medical Decision Making  Medically screening exam initiated at 5:29 AM.  Appropriate orders placed.  Natasha Long was informed that the remainder of the evaluation will be completed by another provider, this initial triage assessment does not replace that evaluation, and the importance of remaining in the ED until their evaluation is complete.  Patient extremely noncompliant with exam, does not participate, yells at staff  BLE pain> has been seen multiple times for similar, most recently 10 days ago  Missed dialysis>> Will check labs to see if needs emergent dialysis   Natasha Long A, PA-C 11/26/22 0532   Patient refused labs with Phlebotomy     Natasha Long A, PA-C 11/26/22 0539

## 2022-11-26 NOTE — ED Notes (Signed)
Patient screams and yells, is inappropriate to staff, is drowsy, hard to understand, then falls back asleep. Will scream then fall back asleep and roll over the bed, scream and yell, and start the whole routine over again.

## 2022-11-26 NOTE — ED Provider Notes (Cosign Needed)
[name] Provider Note   CSN: [id number] Arrival date & time: 11/26/22  0515     History Chief Complaint  Patient presents with   Leg Pain     Natasha Long is a 26 y.o. female with h/o asthma, CHF, end-stage renal disease on dialysis, hypertension, homelessness, BPD presents the emergency department for evaluation of bilateral leg pain. The patient is a difficult historian.  She is not cooperative with exam and is being shouting and screaming at provider and staff.  She reports that she has not had dialysis since she was discharged. She reports she hasn't gone because she "doesn't like it". She also reports she has not had any of her medications since discharge as well. She is requesting pain medication and a sandwich.  She reports that she has had bilateral lower leg pain for the past 2 weeks.  Denies any trauma. She denies pain elsewhere.     HPI     Home Medications Prior to Admission medications   Medication Sig Start Date End Date Taking? Authorizing Provider  acetaminophen (TYLENOL) 325 MG tablet Take 2 tablets (650 mg total) by mouth every 6 (six) hours as needed for mild pain (or Fever >/= 101). 11/11/22   Arrien, [name], MD  ARIPiprazole (ABILIFY) 5 MG tablet Take 1.5 tablets (7.5 mg total) by mouth daily. 09/02/22   Rai, Ripudeep K, MD  b complex-vitamin c-folic acid (NEPHRO-VITE) 0.8 MG TABS tablet Take 1 tablet by mouth daily. 09/02/22   Rai, Vernelle Emerald, MD  calcitRIOL (ROCALTROL) 0.25 MCG capsule Take 1 capsule (0.25 mcg total) by mouth Every Tuesday,Thursday,and Saturday with dialysis. Patient taking differently: Take 0.25 mcg by mouth 3 (three) times a week. 09/03/22   Rai, Vernelle Emerald, MD  calcium acetate (PHOSLO) 667 MG capsule Take 1 capsule (667 mg total) by mouth 3 (three) times daily with meals. 09/02/22   Rai, Vernelle Emerald, MD  carvedilol (COREG) 6.25 MG tablet Take 1 tablet (6.25 mg total) by mouth 2 (two) times  daily with a meal. 11/11/22 12/11/22  Arrien, [name], MD  dicyclomine (BENTYL) 20 MG tablet Take 1 tablet (20 mg total) by mouth every 6 (six) hours as needed for spasms (abdominal cramping). 09/02/22   Rai, Vernelle Emerald, MD  hydrALAZINE (APRESOLINE) 50 MG tablet Take 1 tablet (50 mg total) by mouth every 8 (eight) hours. 11/11/22 12/11/22  Arrien, [name], MD  hydrOXYzine (VISTARIL) 25 MG capsule Take 1 capsule (25 mg total) by mouth every 8 (eight) hours as needed for anxiety. Patient taking differently: Take 50 mg by mouth at bedtime. 09/02/22   Rai, Vernelle Emerald, MD  isosorbide mononitrate (IMDUR) 60 MG 24 hr tablet Take 1 tablet (60 mg total) by mouth daily. 11/11/22 12/11/22  Arrien, [name], MD  methocarbamol (ROBAXIN) 500 MG tablet Take 1 tablet (500 mg total) by mouth every 8 (eight) hours as needed for muscle spasms. 09/02/22   Rai, Vernelle Emerald, MD  sevelamer carbonate (RENVELA) 800 MG tablet Take 2 tablets (1,600 mg total) by mouth 3 (three) times daily with meals. 09/02/22   Rai, Ripudeep Raliegh Ip, MD  torsemide (DEMADEX) 100 MG tablet Take 1 tablet (100 mg total) by mouth every morning. 09/02/22   Rai, Vernelle Emerald, MD      Allergies    Bupropion, Prozac [fluoxetine hcl], and Prednisone    Review of Systems   Review of Systems  Unable to perform ROS: Other (Patient is a difficult historian and  not cooperative)  Musculoskeletal:  Positive for myalgias.    Physical Exam Updated Vital Signs BP (!) 162/114 (BP Location: Right Arm)   Pulse 84   Temp 97.6 F (36.4 C) (Oral)   Resp 20   SpO2 99%  Physical Exam Vitals and nursing note reviewed.  Constitutional:      Appearance: She is not toxic-appearing.     Comments: Somnolent, but awakens to voice. She is screaming at provider and staff.   Eyes:     General: No scleral icterus. Pulmonary:     Effort: Pulmonary effort is normal. No respiratory distress.  Musculoskeletal:        General: Tenderness present.     Right  lower leg: Edema present.     Left lower leg: Edema present.     Comments: Darkening to the bilateral lower legs.  She does have some trace pedal edema bilaterally.  Palpable pulses.  Her compartments are soft although she does have thickened skin to her bilateral lower legs.  Sensation intact.  Extreme tenderness to palpation.  Skin:    General: Skin is dry.     Findings: No rash.  Neurological:     General: No focal deficit present.     ED Results / Procedures / Treatments   Labs (all labs ordered are listed, but only abnormal results are displayed) Labs Reviewed  BASIC METABOLIC PANEL - Abnormal; Notable for the following components:      Result Value   Potassium >7.5 (*)    Chloride 95 (*)    CO2 10 (*)    Glucose, Bld 120 (*)    BUN 168 (*)    Creatinine, Ser 19.77 (*)    GFR, Estimated 2 (*)    Anion gap 31 (*)    All other components within normal limits  CBC WITH DIFFERENTIAL/PLATELET - Abnormal; Notable for the following components:   WBC 10.9 (*)    RBC 3.31 (*)    Hemoglobin 9.6 (*)    HCT 32.3 (*)    MCHC 29.7 (*)    RDW 21.8 (*)    Neutro Abs 9.1 (*)    All other components within normal limits  CK  HCG, SERUM, QUALITATIVE  CBC WITH DIFFERENTIAL/PLATELET  URINALYSIS, ROUTINE W REFLEX MICROSCOPIC  HEPATIC FUNCTION PANEL  HEPATITIS B SURFACE ANTIGEN  HEPATITIS B SURFACE ANTIBODY, QUANTITATIVE  I-STAT CHEM 8, ED    EKG None  Radiology DG Chest 2 View  Result Date: 11/26/2022 CLINICAL DATA:  Missed dialysis. Shortness of breath. EXAM: CHEST - 2 VIEW COMPARISON:  11/16/2022 FINDINGS: Stable cardiac enlargement. Persistent bilateral pleural effusions and overlying bibasilar atelectasis. Mild central vascular congestion but no overt pulmonary edema. IMPRESSION: Persistent cardiac enlargement, bilateral pleural effusions and overlying bibasilar atelectasis. Electronically Signed   By: Marijo Sanes M.D.   On: 11/26/2022 06:51    Procedures Procedures    Medications Ordered in ED Medications  ammonia inhalant (0 each  Hold 11/26/22 1725)  albuterol (PROVENTIL) (2.5 MG/3ML) 0.083% nebulizer solution 10 mg (has no administration in time range)  furosemide (LASIX) 120 mg in dextrose 5 % 50 mL IVPB (has no administration in time range)  calcium gluconate 2 g/ 100 mL sodium chloride IVPB (2,000 mg Intravenous New Bag/Given 11/26/22 1827)  Chlorhexidine Gluconate Cloth 2 % PADS 6 each (has no administration in time range)  pentafluoroprop-tetrafluoroeth (GEBAUERS) aerosol 1 Application (has no administration in time range)  lidocaine (PF) (XYLOCAINE) 1 % injection 5 mL (has no administration  in time range)  lidocaine-prilocaine (EMLA) cream 1 Application (has no administration in time range)  heparin injection 2,000 Units (has no administration in time range)  sodium zirconium cyclosilicate (LOKELMA) packet 10 g (10 g Oral Given 11/26/22 1823)    ED Course/ Medical Decision Making/ A&P Clinical Course as of 11/26/22 1904  Thu Nov 26, 2022  [place] 8 received. Consult placed to Nephrology. [RR]  [place] page returned and wanted orders on calcium gluconate, Lasix, albuterol, and Lokelma.  Patient reports that she still does make urine. [RR]    Clinical Course User Index [RR] Sherrell Puller, PA-C                           Medical Decision Making Amount and/or Complexity of Data Reviewed Labs: ordered.  Risk Prescription drug management.   26 y/o F presents to the ER for evaluation of bilateral leg pain and noncompliance to her dialysis or home medications. Differential diagnosis includes but is not limited to hyperkalemia, dialysis, rhabdomyolysis, chronic pain, compartment syndrome, CHF exacerbation.  Labs delayed as the patient originally refused them. Istat chem 8 ordered. Potassium at 6.9.  BUN greater than 130.  Creatinine undetectable.  Glucose at 125 with a sodium of 131.  Bicarb at 16.  Given this, consult to  nephrology placed.  I spoke with Dr. Hollie Salk who recommends calcium gluconate, Lasix, albuterol, and Lokelma and an emergent dialysis which she will arrange.  Patient was able to receive the calcium gluconate and Lokelma, however was not able to receive the Lasix or albuterol.  Patient is going to dialysis.  While patient is a dialysis, labs are resulting.  I independently reviewed and interpreted the patient's labs.  BMP shows potassium greater than 7.5.  BUN at 160 with creatinine 19.77.  Anion gap of 31.  Her sodium is at 136 with a mild decrease in her chloride at 95.  Bicarb is at 10.  CBC shows white blood cell count of 10.9 with hemoglobin of 9.6.  Peers her baseline is around 7.6-8.7, slightly increased.  CK is within normal limits.  Urinalysis still needs to be collected, patient reports that she still does make urine.  CXR shows persistent cardiac enlargement, bilateral pleural effusions and overlying bibasilar atelectasis.  EKG does not appear to have any peaked T waves.  She has not seen her before for her lower extremity pain.  I doubt any compartment syndrome as her compartments are soft although she does have the thickened skin in her lower extremities.  She has some trace pedal edema however does have some palpable pulses.  She has full range of motion.  No overlying increased warmth or erythema.  No blistering or wounds seen.  I have a low suspicion for any septic arthritis, compartment syndrome, or bilateral DVTs.  Patient is going to dialysis and will need to be re-evaluated when returned.   Final Clinical Impression(s) / ED Diagnoses Final diagnoses:  None    Rx / DC Orders ED Discharge Orders     None         Sherrell Puller, [place] 11/27/22 7342

## 2022-11-26 NOTE — Progress Notes (Signed)
Received patient in bed to unit.  Alert and oriented.  Informed consent signed and in chart.   Treatment initiated: 1950 Treatment completed: 2303  Patient tolerated well.  Transported back to the room  Alert, without acute distress.  Hand-off given to patient's nurse.   Access used: fistula Access issues: none  Total UF removed: 2500 Medication(s) given: none Post HD VS: 97.7, 156/102(118), HR-91, RR-20, SP02-100 Post HD weight: unable to get. Pt too weak to stand and on stretcher   [facility]

## 2022-11-27 ENCOUNTER — Other Ambulatory Visit (HOSPITAL_COMMUNITY): Payer: Self-pay

## 2022-11-27 ENCOUNTER — Other Ambulatory Visit: Payer: Self-pay

## 2022-11-27 ENCOUNTER — Ambulatory Visit: Attending: Physician Assistant | Admitting: Physician Assistant

## 2022-11-27 NOTE — ED Provider Notes (Signed)
Blood pressure (!) 156/105, pulse 90, temperature 97.7 F (36.5 C), temperature source Oral, resp. rate 20, SpO2 100 %.   In short, Natasha Long is a 26 y.o. female with a chief complaint of Leg Pain  .  Refer to the original H&P for additional details.  12:41 AM Patient returns to the ED from dialysis.  She completed her session without complication.  Complaining of stinging pain in her left foot but moving all extremities and without acute distress.  She is sleeping intermittently.  Vital signs reassuring.  Patient appears stable for discharge.  She gets dialysis Monday, Wednesday, Friday.  Advised that she call her dialysis center tomorrow.     Margette Fast, MD 11/27/22 [phone]

## 2022-11-27 NOTE — Discharge Instructions (Signed)
Please report to your dialysis session tomorrow.

## 2022-11-29 NOTE — Progress Notes (Signed)
This encounter was created in error - please disregard.

## 2022-11-30 ENCOUNTER — Other Ambulatory Visit: Payer: Self-pay

## 2022-11-30 ENCOUNTER — Emergency Department (HOSPITAL_COMMUNITY)
Admission: EM | Admit: 2022-11-30 | Discharge: 2022-11-30 | Disposition: A | Discharge status: Home / Self Care | Payer: Medicaid Other | Attending: Emergency Medicine | Admitting: Emergency Medicine

## 2022-11-30 DIAGNOSIS — I509 Heart failure, unspecified: Secondary | ICD-10-CM | POA: Diagnosis not present

## 2022-11-30 DIAGNOSIS — Z992 Dependence on renal dialysis: Secondary | ICD-10-CM | POA: Insufficient documentation

## 2022-11-30 DIAGNOSIS — N186 End stage renal disease: Secondary | ICD-10-CM | POA: Diagnosis not present

## 2022-11-30 DIAGNOSIS — I132 Hypertensive heart and chronic kidney disease with heart failure and with stage 5 chronic kidney disease, or end stage renal disease: Secondary | ICD-10-CM | POA: Insufficient documentation

## 2022-11-30 DIAGNOSIS — M79671 Pain in right foot: Secondary | ICD-10-CM | POA: Diagnosis not present

## 2022-11-30 DIAGNOSIS — M79605 Pain in left leg: Secondary | ICD-10-CM | POA: Diagnosis present

## 2022-11-30 DIAGNOSIS — Z79899 Other long term (current) drug therapy: Secondary | ICD-10-CM | POA: Insufficient documentation

## 2022-11-30 DIAGNOSIS — M79675 Pain in left toe(s): Secondary | ICD-10-CM | POA: Insufficient documentation

## 2022-11-30 DIAGNOSIS — R000 Tachycardia, unspecified: Secondary | ICD-10-CM | POA: Insufficient documentation

## 2022-11-30 LAB — BASIC METABOLIC PANEL
Anion gap: 20 — ABNORMAL HIGH (ref 5–15)
Anion gap: 17 — ABNORMAL HIGH (ref 5–15)
BUN: 143 mg/dL — ABNORMAL HIGH (ref 6–20)
BUN: 79 mg/dL — ABNORMAL HIGH (ref 6–20)
CO2: 19 mmol/L — ABNORMAL LOW (ref 22–32)
CO2: 23 mmol/L (ref 22–32)
Calcium: 9.2 mg/dL (ref 8.9–10.3)
Calcium: 8.9 mg/dL (ref 8.9–10.3)
Chloride: 96 mmol/L — ABNORMAL LOW (ref 98–111)
Chloride: 96 mmol/L — ABNORMAL LOW (ref 98–111)
Creatinine, Ser: 16.98 mg/dL — ABNORMAL HIGH (ref 0.44–1.00)
Creatinine, Ser: 10.23 mg/dL — ABNORMAL HIGH (ref 0.44–1.00)
GFR, Estimated: 3 mL/min — ABNORMAL LOW (ref >60)
GFR, Estimated: 5 mL/min — ABNORMAL LOW (ref >60)
Glucose, Bld: 106 mg/dL — ABNORMAL HIGH (ref 70–99)
Glucose, Bld: 107 mg/dL — ABNORMAL HIGH (ref 70–99)
Potassium: 6 mmol/L — ABNORMAL HIGH (ref 3.5–5.1)
Potassium: 3.8 mmol/L (ref 3.5–5.1)
Sodium: 135 mmol/L (ref 135–145)
Sodium: 136 mmol/L (ref 135–145)

## 2022-11-30 LAB — CBC
HCT: 31 % — ABNORMAL LOW (ref 36.0–46.0)
Hemoglobin: 9.7 g/dL — ABNORMAL LOW (ref 12.0–15.0)
MCH: 29.9 pg (ref 26.0–34.0)
MCHC: 31.3 g/dL (ref 30.0–36.0)
MCV: 95.7 fL (ref 80.0–100.0)
Platelets: 112 10*3/uL — ABNORMAL LOW (ref 150–400)
RBC: 3.24 MIL/uL — ABNORMAL LOW (ref 3.87–5.11)
RDW: 21.1 % — ABNORMAL HIGH (ref 11.5–15.5)
WBC: 12 10*3/uL — ABNORMAL HIGH (ref 4.0–10.5)
nRBC: 0 % (ref 0.0–0.2)

## 2022-11-30 LAB — HEPATITIS B SURFACE ANTIGEN: Hepatitis B Surface Ag: NONREACTIVE

## 2022-11-30 MED ORDER — CHLORHEXIDINE GLUCONATE CLOTH 2 % EX PADS: 6.0000 | MEDICATED_PAD | Freq: Every day | CUTANEOUS | Status: DC

## 2022-11-30 MED ORDER — LIDOCAINE-PRILOCAINE 2.5-2.5 % EX CREA: 1.0000 | TOPICAL_CREAM | CUTANEOUS | Status: DC | PRN

## 2022-11-30 MED ORDER — PENTAFLUOROPROP-TETRAFLUOROETH EX AERO: 1.0000 | INHALATION_SPRAY | CUTANEOUS | Status: DC | PRN

## 2022-11-30 MED ORDER — LIDOCAINE HCL (PF) 1 % IJ SOLN: 5.0000 mL | INTRAMUSCULAR | Status: DC | PRN

## 2022-11-30 MED ORDER — HEPARIN SODIUM (PORCINE) 1000 UNIT/ML IJ SOLN: 3000.0000 [IU] | Freq: Once | INTRAMUSCULAR | Status: AC

## 2022-11-30 MED ORDER — HEPARIN SODIUM (PORCINE) 1000 UNIT/ML IJ SOLN
INTRAMUSCULAR | Status: AC
  Administered 2022-11-30: 3000 [IU] via INTRAVENOUS
  Filled 2022-11-30: qty 3

## 2022-11-30 MED ORDER — HEPARIN SODIUM (PORCINE) 1000 UNIT/ML DIALYSIS: 20.0000 [IU]/kg | INTRAMUSCULAR | Status: DC | PRN

## 2022-11-30 NOTE — ED Notes (Signed)
Pt returned from dialysis in NAD. Received full treatment per hemodialysis RN.

## 2022-11-30 NOTE — ED Triage Notes (Signed)
Patient reports bilateral feet pain for several months , denies injury.

## 2022-11-30 NOTE — Procedures (Signed)
HD Note:  Some information was entered later than the data was gathered due to patient care needs. The stated time with the data is accurate.  Informed consent signed and on her stretcher.  Received patient from the ER on a stretcher.   She did not respond verbally.  This Probation officer stated that dialysis treatment was going to occur and what action was going to happen before any intervention was started.  Patient thrashed when touched and often when the BP cuff was taking a reading.  Patient had a 1/4 of a cigarette that fell out of her coat and was thrown away.  Patient started to speak and show coherence about 2.5 hours into treatment.Patient tolerated treatment without any physical issues.  Transported back to the ER triage department by this staff member    Hand-off given to patient's nurse.   Access used: Left upper arm AVF Access issues: None  Total UF removed: 3000 ml     [facility]

## 2022-11-30 NOTE — ED Notes (Signed)
Patient to Dialysis NOW

## 2022-11-30 NOTE — Progress Notes (Signed)
Noticed pt is here and K is 6 , BUN 140 and BP high.  I assume that her last HD was here on 12/28.  Will arrange for HD here but hope that she will be able to be discharged after from ER ?   If she is not we will do a full renal consult   Dialyzes at [facility] MWF 4 hrs F180 BFR 400 DFR 800 EDW 82.9 kg 2K 2 Ca bath AVF 5000 u bolus heparin Mircera 200 mcg q 2 weeks Calcitriol 0.5 mcg TIW   Louis Meckel

## 2022-11-30 NOTE — Procedures (Signed)
Patient was seen on dialysis and the procedure was supervised.  BFR 400  Via AVF BP is  145/107.   Patient appears to be tolerating treatment well-  is somnolent with a BUN of close to 150.  No change to prescription   Natasha Long 11/30/2022

## 2022-11-30 NOTE — Discharge Instructions (Signed)
You have been evaluated for your symptoms.  You have received dialysis and your potassium level has normalized.  Please resume your dialysis schedule for further management of your condition.

## 2022-11-30 NOTE — ED Provider Notes (Signed)
[facility] EMERGENCY DEPARTMENT Provider Note   CSN: [id number] Arrival date & time: 11/30/22  0114     History  Chief Complaint  Patient presents with   Feet Pain     Zaelyn Audia is a 27 y.o. female.  The history is provided by the patient and medical records. No language interpreter was used.     27 year old female significant history of end-stage renal disease currently hemodialysis, hypertension, homelessness, CHF, who presented complaining of leg pain.  Patient has been in the department for the past 13 hours.  She was last seen 3 days ago for similar presentation and was In the hospital for dialysis and subsequently discharged.  She returns complaining of leg pain that appears to be chronic in nature.  She did receive dialysis a few hours ago and just returned back to the ER to be reassessed because her potassium level was elevated recently as high as 7.5.  Patient without any specific complaint at this time.  She is sleeping but easily arousable.  Home Medications Prior to Admission medications   Medication Sig Start Date End Date Taking? Authorizing Provider  acetaminophen (TYLENOL) 325 MG tablet Take 2 tablets (650 mg total) by mouth every 6 (six) hours as needed for mild pain (or Fever >/= 101). 11/11/22   Arrien, [name], MD  ARIPiprazole (ABILIFY) 5 MG tablet Take 1.5 tablets (7.5 mg total) by mouth daily. 09/02/22   Rai, Ripudeep K, MD  b complex-vitamin c-folic acid (NEPHRO-VITE) 0.8 MG TABS tablet Take 1 tablet by mouth daily. 09/02/22   Rai, Vernelle Emerald, MD  calcitRIOL (ROCALTROL) 0.25 MCG capsule Take 1 capsule (0.25 mcg total) by mouth Every Tuesday,Thursday,and Saturday with dialysis. Patient taking differently: Take 0.25 mcg by mouth 3 (three) times a week. 09/03/22   Rai, Vernelle Emerald, MD  calcium acetate (PHOSLO) 667 MG capsule Take 1 capsule (667 mg total) by mouth 3 (three) times daily with meals. 09/02/22   Rai, Vernelle Emerald, MD   carvedilol (COREG) 6.25 MG tablet Take 1 tablet (6.25 mg total) by mouth 2 (two) times daily with a meal. 11/11/22 12/11/22  Arrien, [name], MD  dicyclomine (BENTYL) 20 MG tablet Take 1 tablet (20 mg total) by mouth every 6 (six) hours as needed for spasms (abdominal cramping). 09/02/22   Rai, Vernelle Emerald, MD  hydrALAZINE (APRESOLINE) 50 MG tablet Take 1 tablet (50 mg total) by mouth every 8 (eight) hours. 11/11/22 12/11/22  Arrien, [name], MD  hydrOXYzine (VISTARIL) 25 MG capsule Take 1 capsule (25 mg total) by mouth every 8 (eight) hours as needed for anxiety. Patient taking differently: Take 50 mg by mouth at bedtime. 09/02/22   Rai, Vernelle Emerald, MD  isosorbide mononitrate (IMDUR) 60 MG 24 hr tablet Take 1 tablet (60 mg total) by mouth daily. 11/11/22 12/11/22  Arrien, [name], MD  methocarbamol (ROBAXIN) 500 MG tablet Take 1 tablet (500 mg total) by mouth every 8 (eight) hours as needed for muscle spasms. 09/02/22   Rai, Vernelle Emerald, MD  sevelamer carbonate (RENVELA) 800 MG tablet Take 2 tablets (1,600 mg total) by mouth 3 (three) times daily with meals. 09/02/22   Rai, Ripudeep Raliegh Ip, MD  torsemide (DEMADEX) 100 MG tablet Take 1 tablet (100 mg total) by mouth every morning. 09/02/22   Mendel Corning, MD      Allergies    Bupropion, Prozac [fluoxetine hcl], and Prednisone    Review of Systems   Review of Systems  All  other systems reviewed and are negative.   Physical Exam Updated Vital Signs BP (!) 149/94   Pulse (!) 106   Temp 98.6 F (37 C) (Oral)   Resp (!) 26   SpO2 99%  Physical Exam Vitals and nursing note reviewed.  Constitutional:      General: She is not in acute distress.    Appearance: She is well-developed.  HENT:     Head: Atraumatic.  Eyes:     Conjunctiva/sclera: Conjunctivae normal.  Cardiovascular:     Rate and Rhythm: Tachycardia present.  Pulmonary:     Effort: Pulmonary effort is normal.  Abdominal:     Palpations: Abdomen is soft.   Musculoskeletal:     Cervical back: Neck supple.  Skin:    Findings: No rash.  Neurological:     Mental Status: She is alert.  Psychiatric:        Mood and Affect: Mood normal.     ED Results / Procedures / Treatments   Labs (all labs ordered are listed, but only abnormal results are displayed) Labs Reviewed  BASIC METABOLIC PANEL - Abnormal; Notable for the following components:      Result Value   Potassium 6.0 (*)    Chloride 96 (*)    CO2 19 (*)    Glucose, Bld 106 (*)    BUN 143 (*)    Creatinine, Ser 16.98 (*)    GFR, Estimated 3 (*)    Anion gap 20 (*)    All other components within normal limits  CBC - Abnormal; Notable for the following components:   WBC 12.0 (*)    RBC 3.24 (*)    Hemoglobin 9.7 (*)    HCT 31.0 (*)    RDW 21.1 (*)    Platelets 112 (*)    All other components within normal limits  BASIC METABOLIC PANEL - Abnormal; Notable for the following components:   Chloride 96 (*)    Glucose, Bld 107 (*)    BUN 79 (*)    Creatinine, Ser 10.23 (*)    GFR, Estimated 5 (*)    Anion gap 17 (*)    All other components within normal limits  HEPATITIS B SURFACE ANTIGEN  HEPATITIS B SURFACE ANTIBODY, QUANTITATIVE    EKG None ED ECG REPORT   Date: 11/30/2022  Rate: 106  Rhythm: sinus tachycardia  QRS Axis: normal  Intervals: QT prolonged  ST/T Wave abnormalities: normal  Conduction Disutrbances:none  Narrative Interpretation:   Old EKG Reviewed: unchanged  I have personally reviewed the EKG tracing and agree with the computerized printout as noted.   Radiology No results found.  Procedures Procedures    Medications Ordered in ED Medications  Chlorhexidine Gluconate Cloth 2 % PADS 6 each (6 each Topical Not Given 11/30/22 1450)  heparin sodium (porcine) injection 3,000 Units (3,000 Units Intravenous Given 11/30/22 5053)    ED Course/ Medical Decision Making/ A&P                           Medical Decision Making Amount and/or Complexity  of Data Reviewed Labs: ordered. ECG/medicine tests: ordered.   BP (!) 149/94   Pulse (!) 106   Temp 98.6 F (37 C) (Oral)   Resp (!) 26   SpO2 99%   96:27 PM 27 year old female significant history of end-stage renal disease currently hemodialysis, hypertension, homelessness, CHF, who presented complaining of leg pain.  Patient has been in the department  for the past 13 hours.  She was last seen 3 days ago for similar presentation and was In the hospital for dialysis and subsequently discharged.  She returns complaining of leg pain that appears to be chronic in nature.  She did receive dialysis a few hours ago and just returned back to the ER to be reassessed because her potassium level was elevated recently as high as 7.5.  Patient without any specific complaint at this time.  She is sleeping but easily arousable.  Repeat BMP at 3:15 AM shows a potassium of 6.0.  This is prior to patient receiving her dialysis.  Will recheck BMP as well as EKG.  Patient overall without any complaint at this time.  3:26 PM Repeat BMP showing normal K+ of 3.8.  pt stable for discharge.  She will benefit from dialysis tomorrow as scheduled.  EMR reviewed and considered in my plan of care.         Final Clinical Impression(s) / ED Diagnoses Final diagnoses:  Foot pain, bilateral    Rx / DC Orders ED Discharge Orders     None         Domenic Moras, PA-C 11/30/22 [place], [place], DO 11/30/22 1555

## 2022-11-30 NOTE — ED Provider Triage Note (Signed)
Emergency Medicine Provider Triage Evaluation Note  Natasha Long , a 27 y.o. female  was evaluated in triage.  Pt complains of pain in her legs described as burning.  She has a history of end-stage renal disease and a history of peripheral neuropathy.  Last dialysis was when she was in the emergency department on 12/28.  Review of Systems  Positive: Leg pain Negative: Fever  Physical Exam  BP (!) 180/130   Pulse (!) 114   Temp 98.6 F (37 C) (Oral)   Resp (!) 22   SpO2 91%  Gen:   Awake, no distress   Resp:  Normal effort  MSK:   Moves extremities without difficulty  Other:  Tachycardia, patient tender to palpation anywhere on the left and right calf and feet  Medical Decision Making  Medically screening exam initiated at 1:56 AM.  Appropriate orders placed.  Natasha Long was informed that the remainder of the evaluation will be completed by another provider, this initial triage assessment does not replace that evaluation, and the importance of remaining in the ED until their evaluation is complete.     Carlisle Cater, PA-C 11/30/22 0157

## 2022-12-01 ENCOUNTER — Encounter (HOSPITAL_COMMUNITY): Payer: Self-pay | Admitting: Student

## 2022-12-01 ENCOUNTER — Other Ambulatory Visit (HOSPITAL_COMMUNITY): Payer: Self-pay

## 2022-12-01 LAB — HEPATITIS B SURFACE ANTIBODY, QUANTITATIVE: Hep B S AB Quant (Post): >1000 m[IU]/mL (ref >9.9)

## 2022-12-02 ENCOUNTER — Encounter: Payer: Self-pay | Admitting: Physician Assistant

## 2022-12-04 ENCOUNTER — Inpatient Hospital Stay (HOSPITAL_COMMUNITY)
Admission: EM | Admit: 2022-12-04 | Discharge: 2022-12-07 | DRG: 291 | Disposition: A | Discharge status: Home / Self Care | Payer: Medicaid Other | Attending: Family Medicine | Admitting: Family Medicine

## 2022-12-04 ENCOUNTER — Emergency Department (HOSPITAL_COMMUNITY): Payer: Medicaid Other

## 2022-12-04 ENCOUNTER — Other Ambulatory Visit: Payer: Self-pay

## 2022-12-04 DIAGNOSIS — I5033 Acute on chronic diastolic (congestive) heart failure: Secondary | ICD-10-CM | POA: Diagnosis present

## 2022-12-04 DIAGNOSIS — Z79899 Other long term (current) drug therapy: Secondary | ICD-10-CM

## 2022-12-04 DIAGNOSIS — N186 End stage renal disease: Secondary | ICD-10-CM | POA: Diagnosis present

## 2022-12-04 DIAGNOSIS — Z1152 Encounter for screening for COVID-19: Secondary | ICD-10-CM | POA: Diagnosis not present

## 2022-12-04 DIAGNOSIS — Z91158 Patient's noncompliance with renal dialysis for other reason: Secondary | ICD-10-CM | POA: Diagnosis not present

## 2022-12-04 DIAGNOSIS — Z833 Family history of diabetes mellitus: Secondary | ICD-10-CM

## 2022-12-04 DIAGNOSIS — F39 Unspecified mood [affective] disorder: Secondary | ICD-10-CM | POA: Diagnosis present

## 2022-12-04 DIAGNOSIS — I132 Hypertensive heart and chronic kidney disease with heart failure and with stage 5 chronic kidney disease, or end stage renal disease: Secondary | ICD-10-CM | POA: Diagnosis not present

## 2022-12-04 DIAGNOSIS — Z888 Allergy status to other drugs, medicaments and biological substances status: Secondary | ICD-10-CM

## 2022-12-04 DIAGNOSIS — Z8249 Family history of ischemic heart disease and other diseases of the circulatory system: Secondary | ICD-10-CM | POA: Diagnosis not present

## 2022-12-04 DIAGNOSIS — N250 Renal osteodystrophy: Secondary | ICD-10-CM | POA: Diagnosis present

## 2022-12-04 DIAGNOSIS — I5032 Chronic diastolic (congestive) heart failure: Secondary | ICD-10-CM | POA: Diagnosis present

## 2022-12-04 DIAGNOSIS — J45909 Unspecified asthma, uncomplicated: Secondary | ICD-10-CM | POA: Diagnosis present

## 2022-12-04 DIAGNOSIS — E8770 Fluid overload, unspecified: Secondary | ICD-10-CM | POA: Diagnosis present

## 2022-12-04 DIAGNOSIS — I3139 Other pericardial effusion (noninflammatory): Secondary | ICD-10-CM | POA: Diagnosis present

## 2022-12-04 DIAGNOSIS — F17210 Nicotine dependence, cigarettes, uncomplicated: Secondary | ICD-10-CM | POA: Diagnosis present

## 2022-12-04 DIAGNOSIS — F603 Borderline personality disorder: Secondary | ICD-10-CM | POA: Diagnosis present

## 2022-12-04 DIAGNOSIS — F332 Major depressive disorder, recurrent severe without psychotic features: Secondary | ICD-10-CM | POA: Diagnosis not present

## 2022-12-04 DIAGNOSIS — F1410 Cocaine abuse, uncomplicated: Secondary | ICD-10-CM | POA: Diagnosis present

## 2022-12-04 DIAGNOSIS — Z91199 Patient's noncompliance with other medical treatment and regimen due to unspecified reason: Secondary | ICD-10-CM

## 2022-12-04 DIAGNOSIS — E875 Hyperkalemia: Secondary | ICD-10-CM

## 2022-12-04 DIAGNOSIS — F4310 Post-traumatic stress disorder, unspecified: Secondary | ICD-10-CM | POA: Diagnosis present

## 2022-12-04 DIAGNOSIS — G9341 Metabolic encephalopathy: Secondary | ICD-10-CM | POA: Diagnosis present

## 2022-12-04 DIAGNOSIS — Z992 Dependence on renal dialysis: Secondary | ICD-10-CM | POA: Diagnosis not present

## 2022-12-04 DIAGNOSIS — D631 Anemia in chronic kidney disease: Secondary | ICD-10-CM | POA: Diagnosis present

## 2022-12-04 DIAGNOSIS — N2581 Secondary hyperparathyroidism of renal origin: Secondary | ICD-10-CM | POA: Diagnosis present

## 2022-12-04 DIAGNOSIS — I10 Essential (primary) hypertension: Secondary | ICD-10-CM | POA: Diagnosis present

## 2022-12-04 DIAGNOSIS — N19 Unspecified kidney failure: Secondary | ICD-10-CM | POA: Diagnosis present

## 2022-12-04 LAB — I-STAT CHEM 8, ED
BUN: >130 mg/dL — ABNORMAL HIGH (ref 6–20)
BUN: 94 mg/dL — ABNORMAL HIGH (ref 6–20)
Calcium, Ion: 0.84 mmol/L — CL (ref 1.15–1.40)
Calcium, Ion: 0.63 mmol/L — CL (ref 1.15–1.40)
Chloride: 105 mmol/L (ref 98–111)
Chloride: 118 mmol/L — ABNORMAL HIGH (ref 98–111)
Creatinine, Ser: 16.1 mg/dL — ABNORMAL HIGH (ref 0.44–1.00)
Creatinine, Ser: 9.8 mg/dL — ABNORMAL HIGH (ref 0.44–1.00)
Glucose, Bld: 110 mg/dL — ABNORMAL HIGH (ref 70–99)
Glucose, Bld: 72 mg/dL (ref 70–99)
HCT: 32 % — ABNORMAL LOW (ref 36.0–46.0)
HCT: 19 % — ABNORMAL LOW (ref 36.0–46.0)
Hemoglobin: 10.9 g/dL — ABNORMAL LOW (ref 12.0–15.0)
Hemoglobin: 6.5 g/dL — CL (ref 12.0–15.0)
Potassium: 7 mmol/L (ref 3.5–5.1)
Potassium: 4.3 mmol/L (ref 3.5–5.1)
Sodium: 132 mmol/L — ABNORMAL LOW (ref 135–145)
Sodium: 141 mmol/L (ref 135–145)
TCO2: 15 mmol/L — ABNORMAL LOW (ref 22–32)
TCO2: 10 mmol/L — ABNORMAL LOW (ref 22–32)

## 2022-12-04 LAB — BASIC METABOLIC PANEL
Anion gap: 25 — ABNORMAL HIGH (ref 5–15)
BUN: 155 mg/dL — ABNORMAL HIGH (ref 6–20)
CO2: 17 mmol/L — ABNORMAL LOW (ref 22–32)
Calcium: 8.4 mg/dL — ABNORMAL LOW (ref 8.9–10.3)
Chloride: 96 mmol/L — ABNORMAL LOW (ref 98–111)
Creatinine, Ser: 14.34 mg/dL — ABNORMAL HIGH (ref 0.44–1.00)
GFR, Estimated: 3 mL/min — ABNORMAL LOW (ref >60)
Glucose, Bld: 146 mg/dL — ABNORMAL HIGH (ref 70–99)
Potassium: 6.5 mmol/L (ref 3.5–5.1)
Sodium: 138 mmol/L (ref 135–145)

## 2022-12-04 LAB — RESP PANEL BY RT-PCR (RSV, FLU A&B, COVID)  RVPGX2
Influenza A by PCR: NEGATIVE
Influenza B by PCR: NEGATIVE
Resp Syncytial Virus by PCR: NEGATIVE
SARS Coronavirus 2 by RT PCR: NEGATIVE

## 2022-12-04 LAB — CBC
HCT: 28.8 % — ABNORMAL LOW (ref 36.0–46.0)
Hemoglobin: 9.1 g/dL — ABNORMAL LOW (ref 12.0–15.0)
MCH: 29.4 pg (ref 26.0–34.0)
MCHC: 31.6 g/dL (ref 30.0–36.0)
MCV: 92.9 fL (ref 80.0–100.0)
Platelets: 158 10*3/uL (ref 150–400)
RBC: 3.1 MIL/uL — ABNORMAL LOW (ref 3.87–5.11)
RDW: 20.2 % — ABNORMAL HIGH (ref 11.5–15.5)
WBC: 10.5 10*3/uL (ref 4.0–10.5)
nRBC: 0 % (ref 0.0–0.2)

## 2022-12-04 LAB — CBC WITH DIFFERENTIAL/PLATELET
Abs Immature Granulocytes: 0.05 10*3/uL (ref 0.00–0.07)
Basophils Absolute: 0 10*3/uL (ref 0.0–0.1)
Basophils Relative: 0 %
Eosinophils Absolute: 0 10*3/uL (ref 0.0–0.5)
Eosinophils Relative: 0 %
HCT: 28.9 % — ABNORMAL LOW (ref 36.0–46.0)
Hemoglobin: 8.7 g/dL — ABNORMAL LOW (ref 12.0–15.0)
Immature Granulocytes: 0 %
Lymphocytes Relative: 8 %
Lymphs Abs: 1 10*3/uL (ref 0.7–4.0)
MCH: 29.2 pg (ref 26.0–34.0)
MCHC: 30.1 g/dL (ref 30.0–36.0)
MCV: 97 fL (ref 80.0–100.0)
Monocytes Absolute: 0.6 10*3/uL (ref 0.1–1.0)
Monocytes Relative: 4 %
Neutro Abs: 11.8 10*3/uL — ABNORMAL HIGH (ref 1.7–7.7)
Neutrophils Relative %: 88 %
Platelets: 187 10*3/uL (ref 150–400)
RBC: 2.98 MIL/uL — ABNORMAL LOW (ref 3.87–5.11)
RDW: 20.2 % — ABNORMAL HIGH (ref 11.5–15.5)
WBC: 13.5 10*3/uL — ABNORMAL HIGH (ref 4.0–10.5)
nRBC: 0 % (ref 0.0–0.2)

## 2022-12-04 LAB — COMPREHENSIVE METABOLIC PANEL
ALT: 46 U/L — ABNORMAL HIGH (ref 0–44)
AST: 74 U/L — ABNORMAL HIGH (ref 15–41)
Albumin: 3.1 g/dL — ABNORMAL LOW (ref 3.5–5.0)
Alkaline Phosphatase: 115 U/L (ref 38–126)
Anion gap: 29 — ABNORMAL HIGH (ref 5–15)
BUN: 148 mg/dL — ABNORMAL HIGH (ref 6–20)
CO2: 13 mmol/L — ABNORMAL LOW (ref 22–32)
Calcium: 9.2 mg/dL (ref 8.9–10.3)
Chloride: 95 mmol/L — ABNORMAL LOW (ref 98–111)
Creatinine, Ser: 14.2 mg/dL — ABNORMAL HIGH (ref 0.44–1.00)
GFR, Estimated: 3 mL/min — ABNORMAL LOW (ref >60)
Glucose, Bld: 100 mg/dL — ABNORMAL HIGH (ref 70–99)
Potassium: 6.7 mmol/L (ref 3.5–5.1)
Sodium: 137 mmol/L (ref 135–145)
Total Bilirubin: 1.2 mg/dL (ref 0.3–1.2)
Total Protein: 7.6 g/dL (ref 6.5–8.1)

## 2022-12-04 LAB — I-STAT BETA HCG BLOOD, ED (MC, WL, AP ONLY): I-stat hCG, quantitative: <5 m[IU]/mL (ref <5)

## 2022-12-04 LAB — CBG MONITORING, ED: Glucose-Capillary: 98 mg/dL (ref 70–99)

## 2022-12-04 MED ORDER — HEPARIN SODIUM (PORCINE) 5000 UNIT/ML IJ SOLN
5000.0000 [IU] | Freq: Three times a day (TID) | INTRAMUSCULAR | Status: DC
  Administered 2022-12-05 – 2022-12-06 (×2): 5000 [IU] via SUBCUTANEOUS
  Filled 2022-12-04 (×4): qty 1

## 2022-12-04 MED ORDER — ONDANSETRON HCL 4 MG PO TABS: 4.0000 mg | ORAL_TABLET | Freq: Four times a day (QID) | ORAL | Status: DC | PRN

## 2022-12-04 MED ORDER — SODIUM BICARBONATE 8.4 % IV SOLN
50.0000 meq | Freq: Once | INTRAVENOUS | Status: DC
  Filled 2022-12-04: qty 50

## 2022-12-04 MED ORDER — SODIUM CHLORIDE 0.9% FLUSH
3.0000 mL | Freq: Two times a day (BID) | INTRAVENOUS | Status: DC
  Administered 2022-12-05 – 2022-12-06 (×3): 3 mL via INTRAVENOUS

## 2022-12-04 MED ORDER — CALCIUM ACETATE (PHOS BINDER) 667 MG PO CAPS
667.0000 mg | ORAL_CAPSULE | Freq: Three times a day (TID) | ORAL | Status: DC
  Administered 2022-12-05 – 2022-12-06 (×5): 667 mg via ORAL
  Filled 2022-12-04 (×4): qty 1

## 2022-12-04 MED ORDER — SEVELAMER CARBONATE 800 MG PO TABS
1600.0000 mg | ORAL_TABLET | Freq: Three times a day (TID) | ORAL | Status: DC
  Administered 2022-12-05 – 2022-12-06 (×5): 1600 mg via ORAL
  Filled 2022-12-04 (×5): qty 2

## 2022-12-04 MED ORDER — CHLORHEXIDINE GLUCONATE CLOTH 2 % EX PADS
6.0000 | MEDICATED_PAD | Freq: Every day | CUTANEOUS | Status: DC
  Administered 2022-12-05 – 2022-12-07 (×2): 6 via TOPICAL

## 2022-12-04 MED ORDER — CARVEDILOL 6.25 MG PO TABS
6.2500 mg | ORAL_TABLET | Freq: Two times a day (BID) | ORAL | Status: DC
  Administered 2022-12-05 – 2022-12-06 (×3): 6.25 mg via ORAL
  Filled 2022-12-04 (×3): qty 1

## 2022-12-04 MED ORDER — SODIUM BICARBONATE 8.4 % IV SOLN
50.0000 meq | Freq: Once | INTRAVENOUS | Status: AC
  Administered 2022-12-04: 50 meq via INTRAVENOUS
  Filled 2022-12-04: qty 50

## 2022-12-04 MED ORDER — ONDANSETRON HCL 4 MG/2ML IJ SOLN: 4.0000 mg | Freq: Four times a day (QID) | INTRAMUSCULAR | Status: DC | PRN

## 2022-12-04 MED ORDER — ARIPIPRAZOLE 5 MG PO TABS
7.5000 mg | ORAL_TABLET | Freq: Every day | ORAL | Status: DC
  Administered 2022-12-05 – 2022-12-06 (×2): 7.5 mg via ORAL
  Filled 2022-12-04 (×4): qty 2

## 2022-12-04 MED ORDER — TORSEMIDE 20 MG PO TABS
100.0000 mg | ORAL_TABLET | Freq: Every morning | ORAL | Status: DC
  Administered 2022-12-05 – 2022-12-06 (×2): 100 mg via ORAL
  Filled 2022-12-04 (×2): qty 5

## 2022-12-04 MED ORDER — INSULIN ASPART 100 UNIT/ML IV SOLN
5.0000 [IU] | Freq: Once | INTRAVENOUS | Status: AC
  Administered 2022-12-04: 5 [IU] via INTRAVENOUS

## 2022-12-04 MED ORDER — DEXTROSE 50 % IV SOLN
1.0000 | Freq: Once | INTRAVENOUS | Status: AC
  Administered 2022-12-04: 50 mL via INTRAVENOUS
  Filled 2022-12-04: qty 50

## 2022-12-04 MED ORDER — SODIUM CHLORIDE 0.9% FLUSH
3.0000 mL | Freq: Two times a day (BID) | INTRAVENOUS | Status: DC
  Administered 2022-12-05 (×2): 3 mL via INTRAVENOUS

## 2022-12-04 MED ORDER — ISOSORBIDE MONONITRATE ER 60 MG PO TB24
60.0000 mg | ORAL_TABLET | Freq: Every day | ORAL | Status: DC
  Administered 2022-12-05 – 2022-12-06 (×2): 60 mg via ORAL
  Filled 2022-12-04 (×2): qty 1

## 2022-12-04 MED ORDER — SODIUM CHLORIDE 0.9 % IV SOLN: 250.0000 mL | INTRAVENOUS | Status: DC | PRN

## 2022-12-04 MED ORDER — SODIUM ZIRCONIUM CYCLOSILICATE 10 G PO PACK
10.0000 g | PACK | Freq: Once | ORAL | Status: AC
  Administered 2022-12-04: 10 g via ORAL
  Filled 2022-12-04: qty 1

## 2022-12-04 MED ORDER — SODIUM CHLORIDE 0.9% FLUSH: 3.0000 mL | INTRAVENOUS | Status: DC | PRN

## 2022-12-04 MED ORDER — HYDRALAZINE HCL 50 MG PO TABS
50.0000 mg | ORAL_TABLET | Freq: Three times a day (TID) | ORAL | Status: DC
  Administered 2022-12-05 – 2022-12-07 (×5): 50 mg via ORAL
  Filled 2022-12-04 (×5): qty 1

## 2022-12-04 NOTE — ED Notes (Signed)
Pt soiled self, this RN assisted pt with getting clean. New sheets and blankets applied to bed.
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
## 2022-12-04 NOTE — Plan of Care (Signed)
Off the floor for hemodialysis at 2130H

## 2022-12-04 NOTE — ED Notes (Addendum)
K 6.7 Critical value reported to MD.
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
## 2022-12-04 NOTE — ED Provider Triage Note (Signed)
Emergency Medicine Provider Triage Evaluation Note  Natasha Long , a 27 y.o. female  was evaluated in triage.  Pt complains of syncopal episode last night.  States she has been feeling dizzy over the last 2 weeks.  Pt states she then began feeling more dizzy last night, fainted, woke up and discovered that she "shat" herself.  Endorses chills, no fevers.  Seen 4 days ago in the ED for bilateral leg pain, stated her left elbow hurt as well at that time, and still hurts today.  No complaints of leg pain today.  Denies hitting head or other injuries  ESRD on dialysis.  Still makes urine.  Requesting food.  Review of Systems  Positive:  Negative: See above  Physical Exam  There were no vitals taken for this visit. Gen:   Awake, no distress   Resp:  Normal effort  MSK:   Moves extremities without difficulty  Other:  Dialysis access left upper extremity.  Abdomen soft nontender.  Chest nonTTP.  Mild swelling of left elbow.  Medical Decision Making  Medically screening exam initiated at 11:09 AM.  Appropriate orders placed.  Jon Berhow was informed that the remainder of the evaluation will be completed by another provider, this initial triage assessment does not replace that evaluation, and the importance of remaining in the ED until their evaluation is complete.     Prince Rome, PA-C 47/42/59 1118

## 2022-12-04 NOTE — ED Triage Notes (Signed)
Pt. Stated, I have a headache with dizziness since last night.

## 2022-12-04 NOTE — ED Provider Notes (Signed)
[facility] EMERGENCY DEPARTMENT Provider Note   CSN: [id number] Arrival date & time: 12/04/22  1028     History  Chief Complaint  Patient presents with   Weakness   Hypertension   Headache   Back Pain   Leg Pain    Natasha Long is a 27 y.o. female.  Pt is a 27 yo female with a pmhx significant for ESRD on HD MWF, htn, ptsd, borderline personality d/o, and chf.  Pt has been noncompliant in the past with meds and with dialysis.  Pt said she did not go to dialysis on Wednesday or Today.  She has been dizzy.  Syncopal event last night.  She is a very poor historian and won't tell me much.        Home Medications Prior to Admission medications   Medication Sig Start Date End Date Taking? Authorizing Provider  acetaminophen (TYLENOL) 325 MG tablet Take 2 tablets (650 mg total) by mouth every 6 (six) hours as needed for mild pain (or Fever >/= 101). 11/11/22   Arrien, [name], MD  ARIPiprazole (ABILIFY) 5 MG tablet Take 1.5 tablets (7.5 mg total) by mouth daily. 09/02/22   Rai, Ripudeep K, MD  b complex-vitamin c-folic acid (NEPHRO-VITE) 0.8 MG TABS tablet Take 1 tablet by mouth daily. 09/02/22   Rai, Vernelle Emerald, MD  calcitRIOL (ROCALTROL) 0.25 MCG capsule Take 1 capsule (0.25 mcg total) by mouth Every Tuesday,Thursday,and Saturday with dialysis. Patient taking differently: Take 0.25 mcg by mouth 3 (three) times a week. 09/03/22   Rai, Vernelle Emerald, MD  calcium acetate (PHOSLO) 667 MG capsule Take 1 capsule (667 mg total) by mouth 3 (three) times daily with meals. 09/02/22   Rai, Vernelle Emerald, MD  carvedilol (COREG) 6.25 MG tablet Take 1 tablet (6.25 mg total) by mouth 2 (two) times daily with a meal. 11/11/22 12/11/22  Arrien, [name], MD  dicyclomine (BENTYL) 20 MG tablet Take 1 tablet (20 mg total) by mouth every 6 (six) hours as needed for spasms (abdominal cramping). 09/02/22   Rai, Vernelle Emerald, MD  hydrALAZINE (APRESOLINE) 50 MG tablet Take 1 tablet  (50 mg total) by mouth every 8 (eight) hours. 11/11/22 12/11/22  Arrien, [name], MD  hydrOXYzine (VISTARIL) 25 MG capsule Take 1 capsule (25 mg total) by mouth every 8 (eight) hours as needed for anxiety. Patient taking differently: Take 50 mg by mouth at bedtime. 09/02/22   Rai, Vernelle Emerald, MD  isosorbide mononitrate (IMDUR) 60 MG 24 hr tablet Take 1 tablet (60 mg total) by mouth daily. 11/11/22 12/11/22  Arrien, [name], MD  methocarbamol (ROBAXIN) 500 MG tablet Take 1 tablet (500 mg total) by mouth every 8 (eight) hours as needed for muscle spasms. 09/02/22   Rai, Vernelle Emerald, MD  sevelamer carbonate (RENVELA) 800 MG tablet Take 2 tablets (1,600 mg total) by mouth 3 (three) times daily with meals. 09/02/22   Rai, Ripudeep Raliegh Ip, MD  torsemide (DEMADEX) 100 MG tablet Take 1 tablet (100 mg total) by mouth every morning. 09/02/22   Rai, Vernelle Emerald, MD      Allergies    Bupropion, Prozac [fluoxetine hcl], and Prednisone    Review of Systems   Review of Systems  Unable to perform ROS: Mental status change  All other systems reviewed and are negative.   Physical Exam Updated Vital Signs BP (!) 153/105 (BP Location: Right Arm)   Pulse 83   Temp 98.2 F (36.8 C) (Oral)   Resp  12   Ht 5\' 8"  (1.727 m)   LMP  (LMP Unknown)   SpO2 100%   BMI 28.76 kg/m  Physical Exam Vitals and nursing note reviewed.  Constitutional:      Appearance: She is well-developed.  HENT:     Head: Normocephalic and atraumatic.     Mouth/Throat:     Mouth: Mucous membranes are dry.  Eyes:     Extraocular Movements: Extraocular movements intact.     Pupils: Pupils are equal, round, and reactive to light.  Cardiovascular:     Rate and Rhythm: Normal rate and regular rhythm.     Heart sounds: Normal heart sounds.  Pulmonary:     Effort: Pulmonary effort is normal.     Breath sounds: Normal breath sounds.  Abdominal:     General: Bowel sounds are normal.     Palpations: Abdomen is soft.   Musculoskeletal:        General: Normal range of motion.     Cervical back: Normal range of motion and neck supple.     Right lower leg: Edema present.     Left lower leg: Edema present.  Skin:    General: Skin is warm.     Capillary Refill: Capillary refill takes less than 2 seconds.  Neurological:     Mental Status: She is alert.     Comments: Pt will tell me her name.  She is not answering any other orientation questions.  She is moving all 4 extremities.  Psychiatric:        Speech: She is noncommunicative.        Behavior: Behavior is uncooperative.     ED Results / Procedures / Treatments   Labs (all labs ordered are listed, but only abnormal results are displayed) Labs Reviewed  CBC WITH DIFFERENTIAL/PLATELET - Abnormal; Notable for the following components:      Result Value   WBC 13.5 (*)    RBC 2.98 (*)    Hemoglobin 8.7 (*)    HCT 28.9 (*)    RDW 20.2 (*)    Neutro Abs 11.8 (*)    All other components within normal limits  I-STAT CHEM 8, ED - Abnormal; Notable for the following components:   Sodium 132 (*)    Potassium 7.0 (*)    BUN >130 (*)    Creatinine, Ser 16.10 (*)    Glucose, Bld 110 (*)    Calcium, Ion 0.84 (*)    TCO2 15 (*)    Hemoglobin 10.9 (*)    HCT 32.0 (*)    All other components within normal limits  RESP PANEL BY RT-PCR (RSV, FLU A&B, COVID)  RVPGX2  COMPREHENSIVE METABOLIC PANEL  URINALYSIS, ROUTINE W REFLEX MICROSCOPIC  RAPID URINE DRUG SCREEN, HOSP PERFORMED  CBG MONITORING, ED  I-STAT BETA HCG BLOOD, ED ([facility], [facility], [facility] ONLY)  CBG MONITORING, ED  I-STAT CHEM 8, ED    EKG EKG Interpretation  Date/Time:  Friday December 04 2022 11:25:59 EST Ventricular Rate:  86 PR Interval:  146 QRS Duration: 106 QT Interval:  406 QTC Calculation: 485 R Axis:   -18 Text Interpretation: Normal sinus rhythm Cannot rule out Anterior infarct , age undetermined Abnormal ECG When compared with ECG of 30-Nov-2022 14:04, PREVIOUS ECG IS PRESENT No  significant change since last tracing Confirmed by Isla Pence [phone]) on 12/04/2022 2:43:06 PM  Radiology CT Head Wo Contrast  Result Date: 12/04/2022 CLINICAL DATA:  Altered level of consciousness EXAM: CT HEAD WITHOUT  CONTRAST TECHNIQUE: Contiguous axial images were obtained from the base of the skull through the vertex without intravenous contrast. RADIATION DOSE REDUCTION: This exam was performed according to the departmental dose-optimization program which includes automated exposure control, adjustment of the mA and/or kV according to patient size and/or use of iterative reconstruction technique. COMPARISON:  10/04/2022 FINDINGS: Brain: No acute infarct or hemorrhage. Lateral ventricles and midline structures are unremarkable. No acute extra-axial fluid collections. No mass effect. Vascular: No hyperdense vessel or unexpected calcification. Skull: Normal. Negative for fracture or focal lesion. Sinuses/Orbits: No acute finding. Other: None. IMPRESSION: 1. Stable head CT, no acute intracranial process. Electronically Signed   By: Randa Ngo M.D.   On: 12/04/2022 16:13   DG Chest Portable 1 View  Result Date: 12/04/2022 CLINICAL DATA:  Altered mental status EXAM: PORTABLE CHEST 1 VIEW COMPARISON:  Chest radiograph dated 11/25/2022 FINDINGS: Low lung volumes. Bibasilar patchy opacities. Small bilateral pleural effusions. No pneumothorax. Similar enlarged cardiomediastinal silhouette. The visualized skeletal structures are unremarkable. IMPRESSION: 1. Low lung volumes with bibasilar patchy opacities, likely atelectasis. 2. Small bilateral pleural effusions.  Unchanged cardiomegaly. Electronically Signed   By: Darrin Nipper M.D.   On: 12/04/2022 14:49    Procedures Procedures    Medications Ordered in ED Medications  sodium zirconium cyclosilicate (LOKELMA) packet 10 g (has no administration in time range)  sodium bicarbonate injection 50 mEq (has no administration in time range)  insulin aspart  (novoLOG) injection 5 Units (has no administration in time range)    And  dextrose 50 % solution 50 mL (has no administration in time range)  Chlorhexidine Gluconate Cloth 2 % PADS 6 each (has no administration in time range)    ED Course/ Medical Decision Making/ A&P                           Medical Decision Making Amount and/or Complexity of Data Reviewed Labs: ordered. Radiology: ordered.  Risk OTC drugs. Prescription drug management. Decision regarding hospitalization.   This patient presents to the ED for concern of ams, this involves an extensive number of treatment options, and is a complaint that carries with it a high risk of complications and morbidity.  The differential diagnosis includes infection, electrolyte abn, cva   Co morbidities that complicate the patient evaluation  ESRD on HD MWF, htn, ptsd, borderline personality d/o, and chf   Additional history obtained:  Additional history obtained from epic chart review    Lab Tests:  I Ordered, and personally interpreted labs.  The pertinent results include:  cbc with wbc 13.5, hgb 8.7 (stable); preg neg; covid/flu neg; istat K elevated at 7; bun >130 and Cr 16.10   Imaging Studies ordered:  I ordered imaging studies including cxr and ct head  I independently visualized and interpreted imaging which showed  CXR: 1. Low lung volumes with bibasilar patchy opacities, likely  atelectasis.  2. Small bilateral pleural effusions.  Unchanged cardiomegaly.  CT head: . Stable head CT, no acute intracranial process.  I agree with the radiologist interpretation   Cardiac Monitoring:  The patient was maintained on a cardiac monitor.  I personally viewed and interpreted the cardiac monitored which showed an underlying rhythm of: nsr   Medicines ordered and prescription drug management:  I ordered medication including lokelma, bicarb, glucose/insulin  for hyperkalemia  Reevaluation of the patient after these  medicines showed that the patient stayed the same I have reviewed the patients home  medicines and have made adjustments as needed   Test Considered:  ct   Critical Interventions:  Meds for elevated K   Consultations Obtained:  I requested consultation with the nephrologist (Dr. Royce Macadamia),  and discussed lab and imaging findings as well as pertinent plan - she will take pt for dialysis Pt d/w Dr. Maylene Roes ([facility]).  She will admit.   Problem List / ED Course:  Hyperkalemia:  appropriate meds given.  Nephrology will take pt to dialysis. Noncompliance with dialysis:  pt will need to decide if she wants to be compliant and live.   Reevaluation:  After the interventions noted above, I reevaluated the patient and found that they have :stayed the same   Social Determinants of Health:  Lives at home   Dispostion:  After consideration of the diagnostic results and the patients response to treatment, I feel that the patent would benefit from admission.    CRITICAL CARE Performed by: Isla Pence   Total critical care time: 30 minutes  Critical care time was exclusive of separately billable procedures and treating other patients.  Critical care was necessary to treat or prevent imminent or life-threatening deterioration.  Critical care was time spent personally by me on the following activities: development of treatment plan with patient and/or surrogate as well as nursing, discussions with consultants, evaluation of patient's response to treatment, examination of patient, obtaining history from patient or surrogate, ordering and performing treatments and interventions, ordering and review of laboratory studies, ordering and review of radiographic studies, pulse oximetry and re-evaluation of patient's condition.         Final Clinical Impression(s) / ED Diagnoses Final diagnoses:  ESRD on hemodialysis ([place])  Hyperkalemia  Acute metabolic encephalopathy    Rx / DC  Orders ED Discharge Orders     None         Isla Pence, MD 12/04/22 1625

## 2022-12-04 NOTE — ED Triage Notes (Signed)
EMS stated, pt is having weakness missed Wednesday's dialysis and today . Pt stated feels dizzy sometimes.

## 2022-12-05 DIAGNOSIS — E875 Hyperkalemia: Secondary | ICD-10-CM | POA: Diagnosis not present

## 2022-12-05 LAB — BASIC METABOLIC PANEL
Anion gap: 22 — ABNORMAL HIGH (ref 5–15)
BUN: 78 mg/dL — ABNORMAL HIGH (ref 6–20)
CO2: 22 mmol/L (ref 22–32)
Calcium: 8 mg/dL — ABNORMAL LOW (ref 8.9–10.3)
Chloride: 92 mmol/L — ABNORMAL LOW (ref 98–111)
Creatinine, Ser: 8.1 mg/dL — ABNORMAL HIGH (ref 0.44–1.00)
GFR, Estimated: 6 mL/min — ABNORMAL LOW (ref >60)
Glucose, Bld: 96 mg/dL (ref 70–99)
Potassium: 3.9 mmol/L (ref 3.5–5.1)
Sodium: 136 mmol/L (ref 135–145)

## 2022-12-05 LAB — RENAL FUNCTION PANEL
Albumin: 2.6 g/dL — ABNORMAL LOW (ref 3.5–5.0)
Anion gap: 16 — ABNORMAL HIGH (ref 5–15)
BUN: 77 mg/dL — ABNORMAL HIGH (ref 6–20)
CO2: 24 mmol/L (ref 22–32)
Calcium: 8.1 mg/dL — ABNORMAL LOW (ref 8.9–10.3)
Chloride: 97 mmol/L — ABNORMAL LOW (ref 98–111)
Creatinine, Ser: 8.06 mg/dL — ABNORMAL HIGH (ref 0.44–1.00)
GFR, Estimated: 6 mL/min — ABNORMAL LOW (ref >60)
Glucose, Bld: 101 mg/dL — ABNORMAL HIGH (ref 70–99)
Phosphorus: 6.9 mg/dL — ABNORMAL HIGH (ref 2.5–4.6)
Potassium: 3.9 mmol/L (ref 3.5–5.1)
Sodium: 137 mmol/L (ref 135–145)

## 2022-12-05 LAB — CBC
HCT: 25.2 % — ABNORMAL LOW (ref 36.0–46.0)
Hemoglobin: 8 g/dL — ABNORMAL LOW (ref 12.0–15.0)
MCH: 28.6 pg (ref 26.0–34.0)
MCHC: 31.7 g/dL (ref 30.0–36.0)
MCV: 90 fL (ref 80.0–100.0)
Platelets: 163 10*3/uL (ref 150–400)
RBC: 2.8 MIL/uL — ABNORMAL LOW (ref 3.87–5.11)
RDW: 19.9 % — ABNORMAL HIGH (ref 11.5–15.5)
WBC: 8.8 10*3/uL (ref 4.0–10.5)
nRBC: 0 % (ref 0.0–0.2)

## 2022-12-05 LAB — HEPATITIS B SURFACE ANTIGEN: Hepatitis B Surface Ag: NONREACTIVE

## 2022-12-05 MED ORDER — KETOROLAC TROMETHAMINE 15 MG/ML IJ SOLN
15.0000 mg | Freq: Three times a day (TID) | INTRAMUSCULAR | Status: DC | PRN
  Administered 2022-12-05 – 2022-12-06 (×4): 15 mg via INTRAVENOUS
  Filled 2022-12-05 (×4): qty 1

## 2022-12-05 MED ORDER — HEPARIN SODIUM (PORCINE) 1000 UNIT/ML DIALYSIS
20.0000 [IU]/kg | INTRAMUSCULAR | Status: DC | PRN
  Filled 2022-12-05: qty 2

## 2022-12-05 MED ORDER — ANTICOAGULANT SODIUM CITRATE 4% (200MG/5ML) IV SOLN
5.0000 mL | Status: DC | PRN
  Filled 2022-12-05: qty 5

## 2022-12-05 MED ORDER — HEPARIN SODIUM (PORCINE) 1000 UNIT/ML DIALYSIS
1000.0000 [IU] | INTRAMUSCULAR | Status: DC | PRN
  Filled 2022-12-05: qty 1

## 2022-12-05 MED ORDER — PENTAFLUOROPROP-TETRAFLUOROETH EX AERO: 1.0000 | INHALATION_SPRAY | CUTANEOUS | Status: DC | PRN

## 2022-12-05 MED ORDER — LIDOCAINE HCL (PF) 1 % IJ SOLN: 5.0000 mL | INTRAMUSCULAR | Status: DC | PRN

## 2022-12-05 MED ORDER — LIDOCAINE-PRILOCAINE 2.5-2.5 % EX CREA
1.0000 | TOPICAL_CREAM | CUTANEOUS | Status: DC | PRN
  Filled 2022-12-05: qty 5

## 2022-12-05 MED ORDER — ALTEPLASE 2 MG IJ SOLR: 2.0000 mg | Freq: Once | INTRAMUSCULAR | Status: DC | PRN

## 2022-12-05 NOTE — Progress Notes (Addendum)
[facility] Progress Note   27 y.o. female with ESRD on HD. She has a history of noncompliance with HD related to her mental health challenges p/w dizziness and HA.  I-stat labs notable for Na 132, K 7.0, BUN >130, Cr 16.10. (Chemistry lab has been down today). Was difficult to arouse, just making unintelligible noises in her sleep.    Her last outpatient dialysis treatment was on 11/13/22. She been to the ED several times in the interim needing dialysis. Her last dialysis was at [facility] on 11/30/22. Plan for urgent dialysis today. She will be admitted to hospitalist service.   Dialysis Orders:  [facility] MWF 4:00 400/800 EDW 82.9kg 2K/2Ca AVF Heparin 5000 Last OP dialysis 11/13/22  -Mircera 200 q 2 wks  -Calcitriol 0.5 TIW Calcium acetate   Assessment/ Plan:   ESRD/Hyperkalemia/Uremia - secondary to noncompliance with dialysis. Tolerated  dialysis overnight to very early 1/6 with 2.1L net UF tolerated. She was refusing further HD today but now willing to; will give her a 3hr tx. AMS - likely uremia. Correct with HD as above; would like to dialyze on Monday given h/o of [place] and risk of dialysis dysequilibrium. Regardless she's refusing dialysis today.  Hypertension/volume  - BP/volume elevated.  Anemia  - Hb 10.9. Follow trends  Metabolic bone disease -  Continue calcium acetate binder/calcitriol Nutrition - Renal diet with fluid restriction when eating   Subjective:   C/o pain and asking for pain meds. Denies f/c/n/v/sob. Asking to eat.   Objective:   BP 128/75 (BP Location: Right Arm)   Pulse (!) 108   Temp 97.8 F (36.6 C)   Resp 18   Ht 5\' 8"  (1.727 m)   Wt 86.6 kg   LMP  (LMP Unknown)   SpO2 99%   BMI 29.03 kg/m   Intake/Output Summary (Last 24 hours) at 12/05/2022 0934 Last data filed at 12/05/2022 8938 Gross per 24 hour  Intake --  Output 2100 ml  Net -2100 ml   Weight change:   Physical Exam: General: Young woman, nad  Head: NCAT sclera not icteric MMM Neck:  Supple. No JVD appreciated  Lungs: Clear bilaterally  Heart: RRR, no murmur, rub, or gallop  Abdomen: soft non-tender, bowel sounds normal, Lower extremities: without edema or ischemic changes, no open wounds  Neuro: Somnolent, not following commands Psych: Not answering questions  Dialysis Access: LUE AVF +bruit   Imaging: CT Head Wo Contrast  Result Date: 12/04/2022 CLINICAL DATA:  Altered level of consciousness EXAM: CT HEAD WITHOUT CONTRAST TECHNIQUE: Contiguous axial images were obtained from the base of the skull through the vertex without intravenous contrast. RADIATION DOSE REDUCTION: This exam was performed according to the departmental dose-optimization program which includes automated exposure control, adjustment of the mA and/or kV according to patient size and/or use of iterative reconstruction technique. COMPARISON:  10/04/2022 FINDINGS: Brain: No acute infarct or hemorrhage. Lateral ventricles and midline structures are unremarkable. No acute extra-axial fluid collections. No mass effect. Vascular: No hyperdense vessel or unexpected calcification. Skull: Normal. Negative for fracture or focal lesion. Sinuses/Orbits: No acute finding. Other: None. IMPRESSION: 1. Stable head CT, no acute intracranial process. Electronically Signed   By: Randa Ngo M.D.   On: 12/04/2022 16:13   DG Chest Portable 1 View  Result Date: 12/04/2022 CLINICAL DATA:  Altered mental status EXAM: PORTABLE CHEST 1 VIEW COMPARISON:  Chest radiograph dated 11/25/2022 FINDINGS: Low lung volumes. Bibasilar patchy opacities. Small bilateral pleural effusions. No pneumothorax. Similar enlarged cardiomediastinal  silhouette. The visualized skeletal structures are unremarkable. IMPRESSION: 1. Low lung volumes with bibasilar patchy opacities, likely atelectasis. 2. Small bilateral pleural effusions.  Unchanged cardiomegaly. Electronically Signed   By: Darrin Nipper M.D.   On: 12/04/2022 14:49    Labs: BMET Recent Labs  Lab  11/30/22 0315 11/30/22 1407 12/04/22 1126 12/04/22 1524 12/04/22 1623 12/04/22 2122 12/05/22 0305  NA 135 136 137 132* 141 138 137  K 6.0* 3.8 6.7* 7.0* 4.3 6.5* 3.9  CL 96* 96* 95* 105 118* 96* 97*  CO2 19* 23 13*  --   --  17* 24  GLUCOSE 106* 107* 100* 110* 72 146* 101*  BUN 143* 79* 148* >130* 94* 155* 77*  CREATININE 16.98* 10.23* 14.20* 16.10* 9.80* 14.34* 8.06*  CALCIUM 9.2 8.9 9.2  --   --  8.4* 8.1*  PHOS  --   --   --   --   --   --  6.9*   CBC Recent Labs  Lab 11/30/22 0315 12/04/22 1126 12/04/22 1524 12/04/22 1623 12/04/22 2122 12/05/22 0305  WBC 12.0* 13.5*  --   --  10.5 8.8  NEUTROABS  --  11.8*  --   --   --   --   HGB 9.7* 8.7* 10.9* 6.5* 9.1* 8.0*  HCT 31.0* 28.9* 32.0* 19.0* 28.8* 25.2*  MCV 95.7 97.0  --   --  92.9 90.0  PLT 112* 187  --   --  158 163    Medications:     ARIPiprazole  7.5 mg Oral Daily   calcium acetate  667 mg Oral TID WC   carvedilol  6.25 mg Oral BID WC   Chlorhexidine Gluconate Cloth  6 each Topical Q0600   heparin  5,000 Units Subcutaneous Q8H   hydrALAZINE  50 mg Oral Q8H   isosorbide mononitrate  60 mg Oral Daily   sevelamer carbonate  1,600 mg Oral TID WC   sodium bicarbonate  50 mEq Intravenous Once   sodium chloride flush  3 mL Intravenous Q12H   sodium chloride flush  3 mL Intravenous Q12H   torsemide  100 mg Oral q morning      Otelia Santee, MD 12/05/2022, 9:34 AM

## 2022-12-05 NOTE — Progress Notes (Signed)
Patient experiencing 2 episodes of nosebleeds today. MD Pahwani made aware. OK to hold heparin for now. Ice placed and patient pinching nose. Continuing to monitor.

## 2022-12-05 NOTE — Progress Notes (Addendum)
PROGRESS NOTE    Natasha Long  SJG:[id number] DOB: 02-04-1996 DOA: 12/04/2022 PCP: [name], MD   Brief Narrative:  HPI: Natasha Long is a 27 y.o. female with medical history significant of dialysis, CHF, hypertension, PTSD and borderline personality disorder who presents to the hospital with chief complaint of weakness and dizziness.  She missed dialysis Wednesday as well as today.  On my examination in the emergency department, patient is arousable, but does not interact or answer questions.  There is no family or emergency contact listed.    ED Course: BMP showed hyperkalemia with potassium 7.0, BUN >130, creatinine 16.1.  CT head unremarkable.  Nephrology consulted for dialysis today.  Assessment & Plan:   Principal Problem:   Hyperkalemia Active Problems:   Acute metabolic encephalopathy   HTN (hypertension)   Acute on chronic diastolic CHF (congestive heart failure) (HCC)   Borderline personality disorder (HCC)   Mood disorder (HCC)   PTSD (post-traumatic stress disorder)   Dialysis patient, noncompliant   Uremia   Volume overload   ESRD on hemodialysis (HCC)  Hyperkalemia with dialysis noncompliance  ESRD on HD MWF  -Patient received Lokelma, sodium bicarbonate, NovoLog/dextrose in the emergency department and eventually underwent hemodialysis, hyperkalemia resolved.  Dialysis per nephrology.   Acute metabolic encephalopathy -In setting of uremia -CT head result unremarkable, patient is still is groggy and appears to be encephalopathy, although slightly better than yesterday.  Patient had refused hemodialysis today that was offered by nephrology.  I discussed the discharge plan with her but she did not feel comfortable going home because she is not feeling well and feels groggy.  Had a lengthy discussion with the patient the importance of hemodialysis in her situation and after counseling, she agreed.  I have notified nephrology.   HTN -Blood pressure  very well-controlled, continue Coreg, hydralazine and Imdur.   chronic diastolic CHF -Echo 65/4650 showed EF 55-60% -Torsemide, imdur  -Volume management with dialysis She does not appear to have acute on chronic diastolic CHF, acute on chronic diastolic CHF ruled out.   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  Echo was done in December 2023.  LV function is normal.  During recent hospitalization when she was discharged on 11/11/2022, she was seen by cardiology and cardiothoracic surgery and they all opinionated that patient does not need pericardial drainage and recommended continuing fluid management by hemodialysis but unfortunately patient continues to remain noncompliant.  Anemia of chronic disease: Stable.  DVT prophylaxis: heparin injection 5,000 Units Start: 12/04/22 2200   Code Status: Full Code  Family Communication:  None present at bedside.  Plan of care discussed with patient in length and he/she verbalized understanding and agreed with it.  Status is: Inpatient Remains inpatient appropriate because: Needs another session of dialysis.  Still encephalopathic.   Estimated body mass index is 29.03 kg/m as calculated from the following:   Height as of this encounter: 5\' 8"  (1.727 m).   Weight as of this encounter: 86.6 kg.    Nutritional Assessment: Body mass index is 29.03 kg/m.Marland Kitchen Seen by dietician.  I agree with the assessment and plan as outlined below: Nutrition Status:        . Skin Assessment: I have examined the patient's skin and I agree with the wound assessment as performed by the wound care RN as outlined below:    Consultants:  Nephrology Procedures:  None  Antimicrobials:  Anti-infectives (From admission, onward)    None         Subjective: Patient seen and examined.  She was groggy but  arousable.  She just does not feel well and complains of generalized body ache.  Has no specific complaint.  Does not feel comfortable going home.  She is now agreeable for dialysis as mentioned above.  Objective: Vitals:   12/05/22 0145 12/05/22 0245 12/05/22 0612 12/05/22 0827  BP: (!) 143/93 (!) 133/100  128/75  Pulse: 100 100  (!) 108  Resp: (!) 25 18  18   Temp: 98.7 F (37.1 C) 98 F (36.7 C)  97.8 F (36.6 C)  TempSrc: Oral     SpO2: 100%   99%  Weight:      Height:   5\' 8"  (1.727 m)     Intake/Output Summary (Last 24 hours) at 12/05/2022 1029 Last data filed at 12/05/2022 8250 Gross per 24 hour  Intake --  Output 2100 ml  Net -2100 ml   Filed Weights   12/04/22 2049  Weight: 86.6 kg    Examination:  General exam: Appears groggy Respiratory system: Diminished breath sounds due to poor inspiratory effort. Cardiovascular system: S1 & S2 heard, RRR. No JVD, murmurs, rubs, gallops or clicks. No pedal edema. Gastrointestinal system: Abdomen is nondistended, soft and nontender. No organomegaly or masses felt. Normal bowel sounds heard. Central nervous system: Alert and oriented. No focal neurological deficits. Extremities: Symmetric 5 x 5 power. Skin: No rashes, lesions or ulcers Psychiatry: Judgement and insight appear poor  Data Reviewed: I have personally reviewed following labs and imaging studies  CBC: Recent Labs  Lab 11/30/22 0315 12/04/22 1126 12/04/22 1524 12/04/22 1623 12/04/22 2122 12/05/22 0305  WBC 12.0* 13.5*  --   --  10.5 8.8  NEUTROABS  --  11.8*  --   --   --   --   HGB 9.7* 8.7* 10.9* 6.5* 9.1* 8.0*  HCT 31.0* 28.9* 32.0* 19.0* 28.8* 25.2*  MCV 95.7 97.0  --   --  92.9 90.0  PLT 112* 187  --   --  158 539   Basic Metabolic Panel: Recent Labs  Lab 11/30/22 0315 11/30/22 1407 12/04/22 1126 12/04/22 1524 12/04/22 1623 12/04/22 2122 12/05/22 0305  NA 135 136 137 132* 141 138 136  137  K 6.0* 3.8 6.7* 7.0* 4.3 6.5* 3.9  3.9  CL 96*  96* 95* 105 118* 96* 92*  97*  CO2 19* 23 13*  --   --  17* 22  24  GLUCOSE 106* 107* 100* 110* 72 146* 96  101*  BUN 143* 79* 148* >130* 94* 155* 78*  77*  CREATININE 16.98* 10.23* 14.20* 16.10* 9.80* 14.34* 8.10*  8.06*  CALCIUM 9.2 8.9 9.2  --   --  8.4* 8.0*  8.1*  PHOS  --   --   --   --   --   --  6.9*   GFR: Estimated Creatinine Clearance: 12 mL/min (A) (by C-G formula based on SCr of 8.06 mg/dL (H)). Liver Function Tests: Recent Labs  Lab 12/04/22 1126 12/05/22 0305  AST 74*  --   ALT 46*  --   ALKPHOS 115  --   BILITOT 1.2  --   PROT 7.6  --   ALBUMIN 3.1* 2.6*   No results for input(s): "LIPASE", "AMYLASE" in the last 168 hours. No results for input(s): "AMMONIA" in the last 168 hours. Coagulation Profile: No  results for input(s): "INR", "PROTIME" in the last 168 hours. Cardiac Enzymes: No results for input(s): "CKTOTAL", "CKMB", "CKMBINDEX", "TROPONINI" in the last 168 hours. BNP (last 3 results) No results for input(s): "PROBNP" in the last 8760 hours. HbA1C: No results for input(s): "HGBA1C" in the last 72 hours. CBG: Recent Labs  Lab 12/04/22 1153  GLUCAP 98   Lipid Profile: No results for input(s): "CHOL", "HDL", "LDLCALC", "TRIG", "CHOLHDL", "LDLDIRECT" in the last 72 hours. Thyroid Function Tests: No results for input(s): "TSH", "T4TOTAL", "FREET4", "T3FREE", "THYROIDAB" in the last 72 hours. Anemia Panel: No results for input(s): "VITAMINB12", "FOLATE", "FERRITIN", "TIBC", "IRON", "RETICCTPCT" in the last 72 hours. Sepsis Labs: No results for input(s): "PROCALCITON", "LATICACIDVEN" in the last 168 hours.  Recent Results (from the past 240 hour(s))  Resp panel by RT-PCR (RSV, Flu A&B, Covid) Anterior Nasal Swab     Status: None   Collection Time: 12/04/22  2:26 PM   Specimen: Anterior Nasal Swab  Result Value Ref Range Status   SARS Coronavirus 2 by RT PCR NEGATIVE NEGATIVE Final    Comment: (NOTE) SARS-CoV-2 target nucleic acids are NOT  DETECTED.  The SARS-CoV-2 RNA is generally detectable in upper respiratory specimens during the acute phase of infection. The lowest concentration of SARS-CoV-2 viral copies this assay can detect is 138 copies/mL. A negative result does not preclude SARS-Cov-2 infection and should not be used as the sole basis for treatment or other patient management decisions. A negative result may occur with  improper specimen collection/handling, submission of specimen other than nasopharyngeal swab, presence of viral mutation(s) within the areas targeted by this assay, and inadequate number of viral copies(<138 copies/mL). A negative result must be combined with clinical observations, patient history, and epidemiological information. The expected result is Negative.  Fact Sheet for Patients:  EntrepreneurPulse.com.au  Fact Sheet for Healthcare Providers:  IncredibleEmployment.be  This test is no t yet approved or cleared by the [place] FDA and  has been authorized for detection and/or diagnosis of SARS-CoV-2 by FDA under an Emergency Use Authorization (EUA). This EUA will remain  in effect (meaning this test can be used) for the duration of the COVID-19 declaration under Section 564(b)(1) of the Act, 21 U.S.C.section 360bbb-3(b)(1), unless the authorization is terminated  or revoked sooner.       Influenza A by PCR NEGATIVE NEGATIVE Final   Influenza B by PCR NEGATIVE NEGATIVE Final    Comment: (NOTE) The Xpert Xpress SARS-CoV-2/FLU/RSV plus assay is intended as an aid in the diagnosis of influenza from Nasopharyngeal swab specimens and should not be used as a sole basis for treatment. Nasal washings and aspirates are unacceptable for Xpert Xpress SARS-CoV-2/FLU/RSV testing.  Fact Sheet for Patients: EntrepreneurPulse.com.au  Fact Sheet for Healthcare Providers: IncredibleEmployment.be  This test is not yet  approved or cleared by the [place] FDA and has been authorized for detection and/or diagnosis of SARS-CoV-2 by FDA under an Emergency Use Authorization (EUA). This EUA will remain in effect (meaning this test can be used) for the duration of the COVID-19 declaration under Section 564(b)(1) of the Act, 21 U.S.C. section 360bbb-3(b)(1), unless the authorization is terminated or revoked.     Resp Syncytial Virus by PCR NEGATIVE NEGATIVE Final    Comment: (NOTE) Fact Sheet for Patients: EntrepreneurPulse.com.au  Fact Sheet for Healthcare Providers: IncredibleEmployment.be  This test is not yet approved or cleared by the [place] FDA and has been authorized for detection and/or diagnosis of SARS-CoV-2 by FDA under an  Emergency Use Authorization (EUA). This EUA will remain in effect (meaning this test can be used) for the duration of the COVID-19 declaration under Section 564(b)(1) of the Act, 21 U.S.C. section 360bbb-3(b)(1), unless the authorization is terminated or revoked.  Performed at [facility], [place] [address], [place], [place] [address]      Radiology Studies: CT Head Wo Contrast  Result Date: 12/04/2022 CLINICAL DATA:  Altered level of consciousness EXAM: CT HEAD WITHOUT CONTRAST TECHNIQUE: Contiguous axial images were obtained from the base of the skull through the vertex without intravenous contrast. RADIATION DOSE REDUCTION: This exam was performed according to the departmental dose-optimization program which includes automated exposure control, adjustment of the mA and/or kV according to patient size and/or use of iterative reconstruction technique. COMPARISON:  10/04/2022 FINDINGS: Brain: No acute infarct or hemorrhage. Lateral ventricles and midline structures are unremarkable. No acute extra-axial fluid collections. No mass effect. Vascular: No hyperdense vessel or unexpected calcification. Skull: Normal. Negative for  fracture or focal lesion. Sinuses/Orbits: No acute finding. Other: None. IMPRESSION: 1. Stable head CT, no acute intracranial process. Electronically Signed   By: Randa Ngo M.D.   On: 12/04/2022 16:13   DG Chest Portable 1 View  Result Date: 12/04/2022 CLINICAL DATA:  Altered mental status EXAM: PORTABLE CHEST 1 VIEW COMPARISON:  Chest radiograph dated 11/25/2022 FINDINGS: Low lung volumes. Bibasilar patchy opacities. Small bilateral pleural effusions. No pneumothorax. Similar enlarged cardiomediastinal silhouette. The visualized skeletal structures are unremarkable. IMPRESSION: 1. Low lung volumes with bibasilar patchy opacities, likely atelectasis. 2. Small bilateral pleural effusions.  Unchanged cardiomegaly. Electronically Signed   By: Darrin Nipper M.D.   On: 12/04/2022 14:49    Scheduled Meds:  ARIPiprazole  7.5 mg Oral Daily   calcium acetate  667 mg Oral TID WC   carvedilol  6.25 mg Oral BID WC   Chlorhexidine Gluconate Cloth  6 each Topical Q0600   heparin  5,000 Units Subcutaneous Q8H   hydrALAZINE  50 mg Oral Q8H   isosorbide mononitrate  60 mg Oral Daily   sevelamer carbonate  1,600 mg Oral TID WC   sodium bicarbonate  50 mEq Intravenous Once   sodium chloride flush  3 mL Intravenous Q12H   sodium chloride flush  3 mL Intravenous Q12H   torsemide  100 mg Oral q morning   Continuous Infusions:  sodium chloride     anticoagulant sodium citrate       LOS: 1 day   Darliss Cheney, MD [facility]  12/05/2022, 10:29 AM   *Please note that this is a verbal dictation therefore any spelling or grammatical errors are due to the "[employer] One" system interpretation.  Please page via [place] and do not message via secure chat for urgent patient care matters. Secure chat can be used for non urgent patient care matters.  How to contact the [facility] Attending or Consulting provider [place] or covering provider during after hours [place], for this patient?  Check the care team in [facility]  and look for a) attending/consulting TRH provider listed and b) the [facility] team listed. Page or secure chat [phone]. Log into [email or web address] and use [place]'s universal password to access. If you do not have the password, please contact the hospital operator. Locate the [facility] provider you are looking for under [facility] and page to a number that you can be directly reached. If you still have difficulty reaching the provider, please page the [facility] (Director on Call) for  the Hospitalists listed on amion for assistance.

## 2022-12-06 DIAGNOSIS — E875 Hyperkalemia: Secondary | ICD-10-CM | POA: Diagnosis not present

## 2022-12-06 LAB — BASIC METABOLIC PANEL
Anion gap: 13 (ref 5–15)
BUN: 65 mg/dL — ABNORMAL HIGH (ref 6–20)
CO2: 25 mmol/L (ref 22–32)
Calcium: 8 mg/dL — ABNORMAL LOW (ref 8.9–10.3)
Chloride: 96 mmol/L — ABNORMAL LOW (ref 98–111)
Creatinine, Ser: 6.71 mg/dL — ABNORMAL HIGH (ref 0.44–1.00)
GFR, Estimated: 8 mL/min — ABNORMAL LOW (ref >60)
Glucose, Bld: 102 mg/dL — ABNORMAL HIGH (ref 70–99)
Potassium: 3.6 mmol/L (ref 3.5–5.1)
Sodium: 134 mmol/L — ABNORMAL LOW (ref 135–145)

## 2022-12-06 MED ORDER — DARBEPOETIN ALFA 200 MCG/0.4ML IJ SOSY
200.0000 ug | PREFILLED_SYRINGE | INTRAMUSCULAR | Status: DC
  Filled 2022-12-06: qty 0.4

## 2022-12-06 NOTE — Progress Notes (Signed)
[facility] Progress Note   27 y.o. female with ESRD on HD. She has a history of noncompliance with HD related to her mental health challenges p/w dizziness and HA.  I-stat labs notable for Na 132, K 7.0, BUN >130, Cr 16.10. (Chemistry lab has been down today). Was difficult to arouse, just making unintelligible noises in her sleep.    Her last outpatient dialysis treatment was on 11/13/22. She been to the ED several times in the interim needing dialysis. Her last dialysis was at [facility] on 11/30/22. Plan for urgent dialysis today. She will be admitted to hospitalist service.   Dialysis Orders:  GOC MWF 4:00 400/800 EDW 82.9kg 2K/2Ca AVF Heparin 5000 Last OP dialysis 11/13/22  -Mircera 200 q 2 wks  -Calcitriol 0.5 TIW Calcium acetate   Assessment/ Plan:   ESRD/Hyperkalemia/Uremia - secondary to noncompliance with dialysis. Tolerated  dialysis overnight to very early 1/6 with 2.1L net UF tolerated. Had extra  dialysis early 1/7. Net UF 1L.  -Back on schedule. Next HD 1/8. 3K bath. UF 2L  AMS - likely uremia. Correct with HD as above; Continue dialysis Monday  Anemia  - Hb 8.0 Follow trends. Will order ESA here.  Metabolic bone disease -  Continue calcium acetate binder/calcitriol Nutrition - Renal diet with fluid restriction when eating   Subjective:   Completed dialysis early am. Net UF 3L. Seen in room. Drowsy. Asked her about getting to dialysis. She says she knows how to call for transportation. Not sure where she is living now.    Objective:   BP (!) 140/87 (BP Location: Right Arm)   Pulse (!) 103   Temp 98 F (36.7 C) (Oral)   Resp 17   Ht 5\' 8"  (1.727 m)   Wt 84 kg   LMP  (LMP Unknown)   SpO2 97%   BMI 28.16 kg/m   Intake/Output Summary (Last 24 hours) at 12/06/2022 0912 Last data filed at 12/06/2022 5956 Gross per 24 hour  Intake 840 ml  Output 1000 ml  Net -160 ml    Weight change: -2.6 kg  Physical Exam: General: Young woman, nad  Head: NCAT sclera not  icteric MMM Neck: Supple. No JVD appreciated  Lungs: Clear bilaterally  Heart: RRR, no murmur, rub, or gallop  Abdomen: soft non-tender, bowel sounds normal, Lower extremities: without edema or ischemic changes, no open wounds  Neuro: Somnolent, not following commands Psych: Not answering questions  Dialysis Access: LUE AVF +bruit   Imaging: CT Head Wo Contrast  Result Date: 12/04/2022 CLINICAL DATA:  Altered level of consciousness EXAM: CT HEAD WITHOUT CONTRAST TECHNIQUE: Contiguous axial images were obtained from the base of the skull through the vertex without intravenous contrast. RADIATION DOSE REDUCTION: This exam was performed according to the departmental dose-optimization program which includes automated exposure control, adjustment of the mA and/or kV according to patient size and/or use of iterative reconstruction technique. COMPARISON:  10/04/2022 FINDINGS: Brain: No acute infarct or hemorrhage. Lateral ventricles and midline structures are unremarkable. No acute extra-axial fluid collections. No mass effect. Vascular: No hyperdense vessel or unexpected calcification. Skull: Normal. Negative for fracture or focal lesion. Sinuses/Orbits: No acute finding. Other: None. IMPRESSION: 1. Stable head CT, no acute intracranial process. Electronically Signed   By: Randa Ngo M.D.   On: 12/04/2022 16:13   DG Chest Portable 1 View  Result Date: 12/04/2022 CLINICAL DATA:  Altered mental status EXAM: PORTABLE CHEST 1 VIEW COMPARISON:  Chest radiograph dated 11/25/2022 FINDINGS: Low lung  volumes. Bibasilar patchy opacities. Small bilateral pleural effusions. No pneumothorax. Similar enlarged cardiomediastinal silhouette. The visualized skeletal structures are unremarkable. IMPRESSION: 1. Low lung volumes with bibasilar patchy opacities, likely atelectasis. 2. Small bilateral pleural effusions.  Unchanged cardiomegaly. Electronically Signed   By: Darrin Nipper M.D.   On: 12/04/2022 14:49     Labs: BMET Recent Labs  Lab 11/30/22 0315 11/30/22 1407 12/04/22 1126 12/04/22 1524 12/04/22 1623 12/04/22 2122 12/05/22 0305 12/06/22 0736  NA 135 136 137 132* 141 138 136  137 134*  K 6.0* 3.8 6.7* 7.0* 4.3 6.5* 3.9  3.9 3.6  CL 96* 96* 95* 105 118* 96* 92*  97* 96*  CO2 19* 23 13*  --   --  17* 22  24 25   GLUCOSE 106* 107* 100* 110* 72 146* 96  101* 102*  BUN 143* 79* 148* >130* 94* 155* 78*  77* 65*  CREATININE 16.98* 10.23* 14.20* 16.10* 9.80* 14.34* 8.10*  8.06* 6.71*  CALCIUM 9.2 8.9 9.2  --   --  8.4* 8.0*  8.1* 8.0*  PHOS  --   --   --   --   --   --  6.9*  --     CBC Recent Labs  Lab 11/30/22 0315 12/04/22 1126 12/04/22 1524 12/04/22 1623 12/04/22 2122 12/05/22 0305  WBC 12.0* 13.5*  --   --  10.5 8.8  NEUTROABS  --  11.8*  --   --   --   --   HGB 9.7* 8.7* 10.9* 6.5* 9.1* 8.0*  HCT 31.0* 28.9* 32.0* 19.0* 28.8* 25.2*  MCV 95.7 97.0  --   --  92.9 90.0  PLT 112* 187  --   --  158 163     Medications:     ARIPiprazole  7.5 mg Oral Daily   calcium acetate  667 mg Oral TID WC   carvedilol  6.25 mg Oral BID WC   Chlorhexidine Gluconate Cloth  6 each Topical Q0600   heparin  5,000 Units Subcutaneous Q8H   hydrALAZINE  50 mg Oral Q8H   isosorbide mononitrate  60 mg Oral Daily   sevelamer carbonate  1,600 mg Oral TID WC   sodium bicarbonate  50 mEq Intravenous Once   sodium chloride flush  3 mL Intravenous Q12H   sodium chloride flush  3 mL Intravenous Q12H   torsemide  100 mg Oral q morning    Lynnda Child PA-C [facility] 12/06/2022,9:12 AM

## 2022-12-06 NOTE — Progress Notes (Signed)
PRE HEMODIALYSIS TREATMENT  12/06/22 0209  Vitals  Temp 98 F (36.7 C)  Temp Source Oral  BP (!) 159/97  MAP (mmHg) 115  BP Location Right Arm  BP Method Automatic  Patient Position (if appropriate) Lying  Pulse Rate 98  Pulse Rate Source Monitor  ECG Heart Rate 99  Resp (!) 23  Oxygen Therapy  SpO2 99 %  O2 Device Nasal Cannula  O2 Flow Rate (L/min) 2 L/min  Patient Activity (if Appropriate) In bed  Pulse Oximetry Type Continuous  Time-Out for Dialysis  What Procedure? HD  Pt Identifiers(min of two) [name] DOB(use if MRN/Acct# not available;MRN/Account#  Correct Site? Yes  Correct Side? Yes  Correct Procedure? Yes  Rad Studies Available? N/A  Safety Precautions Reviewed? Yes  Engineer, civil (consulting) Number 4  Bay/Room Number 3  Alarm Test Passed  Chlorine/Chloramine Test Passed  Conductivity: Machine   (13.9)  Dialyzer Lot Number [id number]  Disposable Set Lot Number [id number]  Dialysate HCO3 Bath Lot Number [id number]  Pre Treatment  Is pt a NEW START this admission?  No  What is patient's outpatient schedule? TTS  Vascular access used during treatment Fistula  Patient is receiving dialysis in a chair No  Hemodialysis Consent Verified Yes  Hemodialysis Standing Orders Initiated Yes  ECG (Telemetry) Monitor On Yes  Prime Ordered Normal Saline  Length of  DialysisTreatment -hour(s) 3 Hour(s)  Dialysis mode HD  Dialyzer Revaclear 300  Dialysate 4K;2.5 Ca  Dialysis Anticoagulation None  Dialysate Flow Ordered 300  Blood Flow Rate Ordered 300 mL/min  Ultrafiltration Goal 1 Liters  Dialysis Blood Pressure Support Ordered Normal Saline  Hepatitis B Pre Treatment Patient Checks  Hepatitis B Surface Antigen Results Nonreactive  Date Hepatitis B Surface Antigen Drawn 11/30/22  Hep B Antibody Quant/Post >1000  Date Hep B Antibody Quant/Post Drawn 11/30/22  Patient's Immunity Status Immune  Isolation Initiated No  Fistula / Graft Left Upper arm  No placement date  or time found.   Orientation: Left  Access Location: Upper arm  Site Condition No complications  Fistula / Graft Assessment Present;Thrill;Bruit;Aneurysm present  Status Deaccessed  Drainage Description None   PRE HEMODIALYSIS

## 2022-12-06 NOTE — Progress Notes (Signed)
Post Hemodialysis treatment.   12/06/22 0539  Vitals  Temp 98.8 F (37.1 C)  Temp Source Oral  BP (!) 145/93  MAP (mmHg) 107  BP Location Right Arm  BP Method Automatic  Patient Position (if appropriate) Lying  Pulse Rate (!) 101  Pulse Rate Source Monitor  ECG Heart Rate (!) 101  Resp (!) 22  Oxygen Therapy  SpO2 96 %  O2 Device Room Air  Patient Activity (if Appropriate) In bed  Post Treatment  Dialyzer Clearance Lightly streaked  Duration of HD Treatment -hour(s) 3 hour(s)  Hemodialysis Intake (mL) 0 mL  Liters Processed 54  Fluid Removed (mL) 1000 mL  Tolerated HD Treatment Yes  Post-Hemodialysis Comments HD tx completed as schedueled, UF goal reached,  pt tolerated well and  is stable.  AVG/AVF Arterial Site Held (minutes) 10 minutes  AVG/AVF Venous Site Held (minutes) 10 minutes  Note  Observations alert, oriented, no complaints , and stable.  Fistula / Graft Left Upper arm  No placement date or time found.   Orientation: Left  Access Location: Upper arm  Site Condition No complications  Fistula / Graft Assessment Present;Thrill;Bruit;Aneurysm present  Status Deaccessed  Drainage Description None   Post

## 2022-12-06 NOTE — Progress Notes (Signed)
PROGRESS NOTE    Natasha Long  GYF:[id number] DOB: [birth year] DOA: 12/04/2022 PCP: [name], MD   Brief Narrative:  HPI: Natasha Long is a 27 y.o. female with medical history significant of dialysis, CHF, hypertension, PTSD and borderline personality disorder who presents to the hospital with chief complaint of weakness and dizziness.  She missed dialysis Wednesday as well as today.  On my examination in the emergency department, patient is arousable, but does not interact or answer questions.  There is no family or emergency contact listed.    ED Course: BMP showed hyperkalemia with potassium 7.0, BUN >130, creatinine 16.1.  CT head unremarkable.  Nephrology consulted for dialysis today.  Assessment & Plan:   Principal Problem:   Hyperkalemia Active Problems:   Acute metabolic encephalopathy   HTN (hypertension)   Acute on chronic diastolic CHF (congestive heart failure) (HCC)   Borderline personality disorder (HCC)   Mood disorder (HCC)   PTSD (post-traumatic stress disorder)   Dialysis patient, noncompliant   Uremia   Volume overload   ESRD on hemodialysis (HCC)  Hyperkalemia with dialysis noncompliance  ESRD on HD MWF  -Patient received Lokelma, sodium bicarbonate, NovoLog/dextrose in the emergency department and eventually underwent hemodialysis, hyperkalemia resolved.  Dialysis per nephrology.   Acute metabolic encephalopathy -In setting of uremia -CT head result unremarkable, I saw this patient twice this morning in 30 minutes difference time and both times, she was deeply in sleep and would not even want to open her eyes even with tactile stimuli but she wanted to try to talk.  Per nursing report, patient usually wakes up and asks for her meals and she is usually fully alert and oriented during the mealtime and finishes 100% of it.  During her lunchtime, she did eventually wake up so I saw her again.  She was feeling well without having any complaints.  She  had no trouble going home however she said that she did not have any medications to take home.  She appears to be fully alert and oriented.   HTN -Blood pressure very well-controlled, continue Coreg, hydralazine and Imdur.   chronic diastolic CHF -Echo 91/6384 showed EF 55-60% -Torsemide, imdur  -Volume management with dialysis She does have elevated JVD but does not appear to be uncomfortable or dyspneic.   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  Echo was done in December 2023.  LV function is normal.  During recent hospitalization when she was discharged on 11/11/2022, she was seen by cardiology and cardiothoracic surgery and they all opinionated that patient does not need pericardial drainage and recommended continuing fluid management by hemodialysis but unfortunately patient continues to remain noncompliant.  Anemia of chronic disease: Stable.  Noncompliance/social issues: Patient is well-known to be noncompliant, this is her 14th admission during this year only.  She says that she has no medications at home.  Unfortunately, she is unreliable and prescribing her medications to her pharmacy would not reassure [place] that she would pick them up and start taking them in a timely manner and thus we are going to keep her tonight so our [facility] will open up tomorrow and we will handle the medications before discharge.  She is due for dialysis tomorrow as well.  DVT prophylaxis: heparin injection 5,000 Units Start: 12/04/22 2200   Code Status: Full Code  Family Communication:  None present at bedside.  Plan  of care discussed with patient in length and he/she verbalized understanding and agreed with it.  Status is: Inpatient Remains inpatient appropriate because: Needs another session of dialysis.  Still encephalopathic.   Estimated body mass  index is 28.16 kg/m as calculated from the following:   Height as of this encounter: 5\' 8"  (1.727 m).   Weight as of this encounter: 84 kg.    Nutritional Assessment: Body mass index is 28.16 kg/m.Marland Kitchen Seen by dietician.  I agree with the assessment and plan as outlined below: Nutrition Status:        . Skin Assessment: I have examined the patient's skin and I agree with the wound assessment as performed by the wound care RN as outlined below:    Consultants:  Nephrology Procedures:  None  Antimicrobials:  Anti-infectives (From admission, onward)    None         Subjective:  Seen and examined.  She has no complaints.  Objective: Vitals:   12/06/22 0539 12/06/22 0629 12/06/22 0644 12/06/22 0822  BP: (!) 145/93  (!) 143/96 (!) 140/87  Pulse: (!) 101  (!) 102 (!) 103  Resp: (!) 22  20 17   Temp: 98.8 F (37.1 C)  97.6 F (36.4 C) 98 F (36.7 C)  TempSrc: Oral  Oral Oral  SpO2: 96%  97% 97%  Weight:  84 kg    Height:        Intake/Output Summary (Last 24 hours) at 12/06/2022 1346 Last data filed at 12/06/2022 0900 Gross per 24 hour  Intake 720 ml  Output 1000 ml  Net -280 ml    Filed Weights   12/04/22 2049 12/06/22 0629  Weight: 86.6 kg 84 kg    Examination:  General exam: Appears calm and comfortable  Respiratory system: Clear to auscultation. Respiratory effort normal. Cardiovascular system: S1 & S2 heard, RRR. No murmurs, rubs, gallops or clicks. No pedal edema.  Elevated JVD. Gastrointestinal system: Abdomen is nondistended, soft and nontender. No organomegaly or masses felt. Normal bowel sounds heard. Central nervous system: Alert and oriented. No focal neurological deficits. Extremities: Symmetric 5 x 5 power. Skin: No rashes, lesions or ulcers.    Data Reviewed: I have personally reviewed following labs and imaging studies  CBC: Recent Labs  Lab 11/30/22 0315 12/04/22 1126 12/04/22 1524 12/04/22 1623 12/04/22 2122 12/05/22 0305  WBC  12.0* 13.5*  --   --  10.5 8.8  NEUTROABS  --  11.8*  --   --   --   --   HGB 9.7* 8.7* 10.9* 6.5* 9.1* 8.0*  HCT 31.0* 28.9* 32.0* 19.0* 28.8* 25.2*  MCV 95.7 97.0  --   --  92.9 90.0  PLT 112* 187  --   --  158 865    Basic Metabolic Panel: Recent Labs  Lab 11/30/22 1407 12/04/22 1126 12/04/22 1524 12/04/22 1623 12/04/22 2122 12/05/22 0305 12/06/22 0736  NA 136 137 132* 141 138 136  137 134*  K 3.8 6.7* 7.0* 4.3 6.5* 3.9  3.9 3.6  CL 96* 95* 105 118* 96* 92*  97* 96*  CO2 23 13*  --   --  17* 22  24 25   GLUCOSE 107* 100* 110* 72 146* 96  101* 102*  BUN 79* 148* >130* 94* 155* 78*  77* 65*  CREATININE 10.23* 14.20* 16.10* 9.80* 14.34* 8.10*  8.06* 6.71*  CALCIUM 8.9 9.2  --   --  8.4* 8.0*  8.1* 8.0*  PHOS  --   --   --   --   --  6.9*  --     GFR: Estimated Creatinine Clearance: 14.2 mL/min (A) (by C-G formula based on SCr of 6.71 mg/dL (H)). Liver Function Tests: Recent Labs  Lab 12/04/22 1126 12/05/22 0305  AST 74*  --   ALT 46*  --   ALKPHOS 115  --   BILITOT 1.2  --   PROT 7.6  --   ALBUMIN 3.1* 2.6*    No results for input(s): "LIPASE", "AMYLASE" in the last 168 hours. No results for input(s): "AMMONIA" in the last 168 hours. Coagulation Profile: No results for input(s): "INR", "PROTIME" in the last 168 hours. Cardiac Enzymes: No results for input(s): "CKTOTAL", "CKMB", "CKMBINDEX", "TROPONINI" in the last 168 hours. BNP (last 3 results) No results for input(s): "PROBNP" in the last 8760 hours. HbA1C: No results for input(s): "HGBA1C" in the last 72 hours. CBG: Recent Labs  Lab 12/04/22 1153  GLUCAP 98    Lipid Profile: No results for input(s): "CHOL", "HDL", "LDLCALC", "TRIG", "CHOLHDL", "LDLDIRECT" in the last 72 hours. Thyroid Function Tests: No results for input(s): "TSH", "T4TOTAL", "FREET4", "T3FREE", "THYROIDAB" in the last 72 hours. Anemia Panel: No results for input(s): "VITAMINB12", "FOLATE", "FERRITIN", "TIBC", "IRON",  "RETICCTPCT" in the last 72 hours. Sepsis Labs: No results for input(s): "PROCALCITON", "LATICACIDVEN" in the last 168 hours.  Recent Results (from the past 240 hour(s))  Resp panel by RT-PCR (RSV, Flu A&B, Covid) Anterior Nasal Swab     Status: None   Collection Time: 12/04/22  2:26 PM   Specimen: Anterior Nasal Swab  Result Value Ref Range Status   SARS Coronavirus 2 by RT PCR NEGATIVE NEGATIVE Final    Comment: (NOTE) SARS-CoV-2 target nucleic acids are NOT DETECTED.  The SARS-CoV-2 RNA is generally detectable in upper respiratory specimens during the acute phase of infection. The lowest concentration of SARS-CoV-2 viral copies this assay can detect is 138 copies/mL. A negative result does not preclude SARS-Cov-2 infection and should not be used as the sole basis for treatment or other patient management decisions. A negative result may occur with  improper specimen collection/handling, submission of specimen other than nasopharyngeal swab, presence of viral mutation(s) within the areas targeted by this assay, and inadequate number of viral copies(<138 copies/mL). A negative result must be combined with clinical observations, patient history, and epidemiological information. The expected result is Negative.  Fact Sheet for Patients:  EntrepreneurPulse.com.au  Fact Sheet for Healthcare Providers:  IncredibleEmployment.be  This test is no t yet approved or cleared by the [place] FDA and  has been authorized for detection and/or diagnosis of SARS-CoV-2 by FDA under an Emergency Use Authorization (EUA). This EUA will remain  in effect (meaning this test can be used) for the duration of the COVID-19 declaration under Section 564(b)(1) of the Act, 21 U.S.C.section 360bbb-3(b)(1), unless the authorization is terminated  or revoked sooner.       Influenza A by PCR NEGATIVE NEGATIVE Final   Influenza B by PCR NEGATIVE NEGATIVE Final     Comment: (NOTE) The Xpert Xpress SARS-CoV-2/FLU/RSV plus assay is intended as an aid in the diagnosis of influenza from Nasopharyngeal swab specimens and should not be used as a sole basis for treatment. Nasal washings and aspirates are unacceptable for Xpert Xpress SARS-CoV-2/FLU/RSV testing.  Fact Sheet for Patients: EntrepreneurPulse.com.au  Fact Sheet for Healthcare Providers: IncredibleEmployment.be  This test is not yet approved or cleared by the [place] FDA and has been authorized for detection and/or diagnosis of SARS-CoV-2 by FDA under an  Emergency Use Authorization (EUA). This EUA will remain in effect (meaning this test can be used) for the duration of the COVID-19 declaration under Section 564(b)(1) of the Act, 21 U.S.C. section 360bbb-3(b)(1), unless the authorization is terminated or revoked.     Resp Syncytial Virus by PCR NEGATIVE NEGATIVE Final    Comment: (NOTE) Fact Sheet for Patients: EntrepreneurPulse.com.au  Fact Sheet for Healthcare Providers: IncredibleEmployment.be  This test is not yet approved or cleared by the [place] FDA and has been authorized for detection and/or diagnosis of SARS-CoV-2 by FDA under an Emergency Use Authorization (EUA). This EUA will remain in effect (meaning this test can be used) for the duration of the COVID-19 declaration under Section 564(b)(1) of the Act, 21 U.S.C. section 360bbb-3(b)(1), unless the authorization is terminated or revoked.  Performed at [facility], [place] [address] [place], [place] [address]      Radiology Studies: CT Head Wo Contrast  Result Date: 12/04/2022 CLINICAL DATA:  Altered level of consciousness EXAM: CT HEAD WITHOUT CONTRAST TECHNIQUE: Contiguous axial images were obtained from the base of the skull through the vertex without intravenous contrast. RADIATION DOSE REDUCTION: This exam was performed  according to the departmental dose-optimization program which includes automated exposure control, adjustment of the mA and/or kV according to patient size and/or use of iterative reconstruction technique. COMPARISON:  10/04/2022 FINDINGS: Brain: No acute infarct or hemorrhage. Lateral ventricles and midline structures are unremarkable. No acute extra-axial fluid collections. No mass effect. Vascular: No hyperdense vessel or unexpected calcification. Skull: Normal. Negative for fracture or focal lesion. Sinuses/Orbits: No acute finding. Other: None. IMPRESSION: 1. Stable head CT, no acute intracranial process. Electronically Signed   By: Randa Ngo M.D.   On: 12/04/2022 16:13   DG Chest Portable 1 View  Result Date: 12/04/2022 CLINICAL DATA:  Altered mental status EXAM: PORTABLE CHEST 1 VIEW COMPARISON:  Chest radiograph dated 11/25/2022 FINDINGS: Low lung volumes. Bibasilar patchy opacities. Small bilateral pleural effusions. No pneumothorax. Similar enlarged cardiomediastinal silhouette. The visualized skeletal structures are unremarkable. IMPRESSION: 1. Low lung volumes with bibasilar patchy opacities, likely atelectasis. 2. Small bilateral pleural effusions.  Unchanged cardiomegaly. Electronically Signed   By: Darrin Nipper M.D.   On: 12/04/2022 14:49    Scheduled Meds:  ARIPiprazole  7.5 mg Oral Daily   calcium acetate  667 mg Oral TID WC   carvedilol  6.25 mg Oral BID WC   Chlorhexidine Gluconate Cloth  6 each Topical Q0600   [START ON 12/07/2022] darbepoetin (ARANESP) injection - DIALYSIS  200 mcg Subcutaneous Q Mon-1800   heparin  5,000 Units Subcutaneous Q8H   hydrALAZINE  50 mg Oral Q8H   isosorbide mononitrate  60 mg Oral Daily   sevelamer carbonate  1,600 mg Oral TID WC   sodium bicarbonate  50 mEq Intravenous Once   sodium chloride flush  3 mL Intravenous Q12H   sodium chloride flush  3 mL Intravenous Q12H   torsemide  100 mg Oral q morning   Continuous Infusions:  sodium chloride        LOS: 2 days   Darliss Cheney, MD [facility]  12/06/2022, 1:46 PM   *Please note that this is a verbal dictation therefore any spelling or grammatical errors are due to the "[employer]" system interpretation.  Please page via [place] and do not message via secure chat for urgent patient care matters. Secure chat can be used for non urgent patient care matters.  How to contact the  [facility] Attending or Consulting provider [place] or covering provider during after hours [place], for this patient?  Check the care team in [facility] and look for a) attending/consulting [facility] provider listed and b) the [facility] team listed. Page or secure chat [phone]. Log into [email or web address] and use [place]'s universal password to access. If you do not have the password, please contact the hospital operator. Locate the [facility], The provider you are looking for under [facility] and page to a number that you can be directly reached. If you still have difficulty reaching the provider, please page the [facility] (Director on Call) for the Hospitalists listed on amion for assistance.

## 2022-12-07 ENCOUNTER — Other Ambulatory Visit (HOSPITAL_COMMUNITY): Payer: Self-pay

## 2022-12-07 DIAGNOSIS — E875 Hyperkalemia: Secondary | ICD-10-CM | POA: Diagnosis not present

## 2022-12-07 LAB — RENAL FUNCTION PANEL
Albumin: 2.2 g/dL — ABNORMAL LOW (ref 3.5–5.0)
Anion gap: 15 (ref 5–15)
BUN: 89 mg/dL — ABNORMAL HIGH (ref 6–20)
CO2: 24 mmol/L (ref 22–32)
Calcium: 8.8 mg/dL — ABNORMAL LOW (ref 8.9–10.3)
Chloride: 95 mmol/L — ABNORMAL LOW (ref 98–111)
Creatinine, Ser: 7.89 mg/dL — ABNORMAL HIGH (ref 0.44–1.00)
GFR, Estimated: 7 mL/min — ABNORMAL LOW (ref >60)
Glucose, Bld: 99 mg/dL (ref 70–99)
Phosphorus: 5 mg/dL — ABNORMAL HIGH (ref 2.5–4.6)
Potassium: 3.8 mmol/L (ref 3.5–5.1)
Sodium: 134 mmol/L — ABNORMAL LOW (ref 135–145)

## 2022-12-07 LAB — CBC
HCT: 26.1 % — ABNORMAL LOW (ref 36.0–46.0)
Hemoglobin: 8.1 g/dL — ABNORMAL LOW (ref 12.0–15.0)
MCH: 29.6 pg (ref 26.0–34.0)
MCHC: 31 g/dL (ref 30.0–36.0)
MCV: 95.3 fL (ref 80.0–100.0)
Platelets: 133 10*3/uL — ABNORMAL LOW (ref 150–400)
RBC: 2.74 MIL/uL — ABNORMAL LOW (ref 3.87–5.11)
RDW: 19.9 % — ABNORMAL HIGH (ref 11.5–15.5)
WBC: 10.2 10*3/uL (ref 4.0–10.5)
nRBC: 0 % (ref 0.0–0.2)

## 2022-12-07 LAB — HEPATITIS B SURFACE ANTIBODY, QUANTITATIVE: Hep B S AB Quant (Post): >1000 m[IU]/mL (ref >9.9)

## 2022-12-07 MED ORDER — CALCITRIOL 0.25 MCG PO CAPS
0.2500 ug | ORAL_CAPSULE | ORAL | 0 refills | Status: DC
  Filled 2022-12-07: qty 30, 70d supply, fill #0

## 2022-12-07 MED ORDER — ARIPIPRAZOLE 5 MG PO TABS
7.5000 mg | ORAL_TABLET | Freq: Every day | ORAL | 1 refills | Status: DC
  Filled 2022-12-07: qty 45, 30d supply, fill #0

## 2022-12-07 MED ORDER — SEVELAMER CARBONATE 800 MG PO TABS
1600.0000 mg | ORAL_TABLET | Freq: Three times a day (TID) | ORAL | 0 refills | Status: DC
  Filled 2022-12-07: qty 180, 30d supply, fill #0

## 2022-12-07 MED ORDER — CARVEDILOL 6.25 MG PO TABS
6.2500 mg | ORAL_TABLET | Freq: Two times a day (BID) | ORAL | 0 refills | Status: DC
  Filled 2022-12-07: qty 60, 30d supply, fill #0

## 2022-12-07 MED ORDER — TORSEMIDE 100 MG PO TABS
100.0000 mg | ORAL_TABLET | Freq: Every morning | ORAL | 3 refills | Status: DC
  Filled 2022-12-07: qty 30, 30d supply, fill #0

## 2022-12-07 MED ORDER — CALCIUM ACETATE (PHOS BINDER) 667 MG PO CAPS
667.0000 mg | ORAL_CAPSULE | Freq: Three times a day (TID) | ORAL | 3 refills | Status: DC
  Filled 2022-12-07: qty 90, 30d supply, fill #0

## 2022-12-07 MED ORDER — HYDRALAZINE HCL 50 MG PO TABS
50.0000 mg | ORAL_TABLET | Freq: Three times a day (TID) | ORAL | 0 refills | Status: DC
  Filled 2022-12-07: qty 90, 30d supply, fill #0

## 2022-12-07 MED ORDER — HEPARIN SODIUM (PORCINE) 1000 UNIT/ML IJ SOLN
INTRAMUSCULAR | Status: AC
  Filled 2022-12-07: qty 5

## 2022-12-07 MED ORDER — HEPARIN SODIUM (PORCINE) 1000 UNIT/ML DIALYSIS
5000.0000 [IU] | INTRAMUSCULAR | Status: DC | PRN
  Administered 2022-12-07: 5000 [IU] via INTRAVENOUS_CENTRAL
  Filled 2022-12-07: qty 5

## 2022-12-07 MED ORDER — ISOSORBIDE MONONITRATE ER 60 MG PO TB24
60.0000 mg | ORAL_TABLET | Freq: Every day | ORAL | 0 refills | Status: DC
  Filled 2022-12-07: qty 30, 30d supply, fill #0

## 2022-12-07 NOTE — Discharge Summary (Signed)
Physician Discharge Summary  Natasha Long YKD:[id number] DOB: 05-Feb-1996 DOA: 12/04/2022  PCP: [name], MD  Admit date: 12/04/2022 Discharge date: 12/07/2022 30 Day Unplanned Readmission Risk Score    Flowsheet Row ED to Hosp-Admission (Current) from 12/04/2022 in [place]  30 Day Unplanned Readmission Risk Score (%) 84.04 Filed at 12/07/2022 0801       This score is the patient's risk of an unplanned readmission within 30 days of being discharged (0 -100%). The score is based on dignosis, age, lab data, medications, orders, and past utilization.   Low:  0-14.9   Medium: 15-21.9   High: 22-29.9   Extreme: 30 and above          Admitted From: Home Disposition: Home  Recommendations for Outpatient Follow-up:  Follow up with PCP in 1-2 weeks Please obtain BMP/CBC in one week Please follow up with your PCP on the following pending results: Unresulted Labs (From admission, onward)     Start     Ordered   12/05/22 1012  Hepatitis B surface antibody,quantitative  (New Admission Hemo Labs (Hepatitis B))  Once,   R        12/05/22 1013   12/04/22 1427  Urine rapid drug screen (hosp performed)  Once,   STAT        12/04/22 1426   12/04/22 1118  Urinalysis, Routine w reflex microscopic  Once,   URGENT        12/04/22 [place]: None Equipment/Devices: None  Discharge Condition: Stable CODE STATUS: Full code Diet recommendation: Renal  Subjective: Seen and examined, other than generalized body ache which is chronic finding for her, she has no other complaint.  She remains sleepy, that is her baseline.  Brief/Interim Summary: Natasha Long is a 27 y.o. female with medical history significant of ESR D dialysis dependent, CHF, chronic pericardial effusion, hypertension, PTSD and borderline personality disorder who presented to the hospital with chief complaint of weakness and dizziness.  She has a long  history of noncompliance and this is her 14th hospitalization in last 12 months.  She missed previous 2 sessions of dialysis. BMP showed hyperkalemia with potassium 7.0, BUN >130, creatinine 16.1.  CT head unremarkable.  Nephrology consulted for dialysis, patient admitted under hospitalist service.  She received Lokelma, started on dialysis, hyperkalemia improved.  She is on Monday Wednesday Friday schedule.  She is getting her dialysis today and she will be due on Wednesday again.  Patient has longstanding history of noncompliance, I have spent great length of time with her explaining and counseling about the importance of continuing the dialysis and avoiding hospitalizations, she does not seem to be motivated to follow the recommendations.  For that reason, she remains at high risk of rehospitalization.   Acute metabolic encephalopathy -In setting of uremia -CT head result unremarkable, patient stands to be sleepy almost every time and avoids having any conversation with the physicians and pretends to be lethargic but she is fully alert and awake whenever it is mealtime and she consumes all of her meals.   HTN -Blood pressure very well-controlled, continue Coreg, hydralazine and Imdur.  Unsure whether she has home medications or not, she states she does not.  I have prescribed all these medications again to her [place].   chronic diastolic CHF -Echo 39/7673 showed EF 55-60% -Torsemide, imdur  -Volume management with dialysis   PTSD  Borderline personality disorder -Abilify   Pericardial effusion: Patient appears to have chronic uremic pericardial fusion with no clinical signs of acute pericarditis or tamponade.  Echo was done in December 2023.  LV function is normal.  During recent hospitalization when she was discharged on 11/11/2022, she was seen by cardiology and cardiothoracic surgery and they all opinionated that patient does not need pericardial drainage and recommended continuing fluid  management by hemodialysis but unfortunately patient continues to remain noncompliant.   Anemia of chronic disease: Stable.  Discharge plan was discussed with patient and/or family member and they verbalized understanding and agreed with it.  Discharge Diagnoses:  Principal Problem:   Hyperkalemia Active Problems:   Acute metabolic encephalopathy   HTN (hypertension)   Acute on chronic diastolic CHF (congestive heart failure) (HCC)   Borderline personality disorder (HCC)   Mood disorder (HCC)   PTSD (post-traumatic stress disorder)   Dialysis patient, noncompliant   Uremia   Volume overload   ESRD on hemodialysis [facility])    Discharge Instructions   Allergies as of 12/07/2022       Reactions   Bupropion Anxiety, Other (See Comments), Nausea And Vomiting   Caused panic attacks Caused panic attacks    "Adverse effects"   Prozac [fluoxetine Hcl] Anxiety, Other (See Comments)   Caused panic attacks. Dizziness    Prednisone Other (See Comments)   Pt stated this med caused pancreatitis. Dizziness        Medication List     STOP taking these medications    acetaminophen 325 MG tablet Commonly known as: TYLENOL   dicyclomine 20 MG tablet Commonly known as: BENTYL   hydrOXYzine 25 MG capsule Commonly known as: VISTARIL   methocarbamol 500 MG tablet Commonly known as: ROBAXIN   multivitamin Tabs tablet       TAKE these medications    ARIPiprazole 5 MG tablet Commonly known as: ABILIFY Take 1.5 tablets (7.5 mg total) by mouth daily.   calcitRIOL 0.25 MCG capsule Commonly known as: ROCALTROL Take 1 capsule (0.25 mcg total) by mouth Every Tuesday,Thursday,and Saturday with dialysis. Start taking on: December 08, 2022   calcium acetate 667 MG capsule Commonly known as: PHOSLO Take 1 capsule (667 mg total) by mouth 3 (three) times daily with meals.   carvedilol 6.25 MG tablet Commonly known as: COREG Take 1 tablet (6.25 mg total) by mouth 2 (two) times daily  with a meal.   hydrALAZINE 50 MG tablet Commonly known as: APRESOLINE Take 1 tablet (50 mg total) by mouth every 8 (eight) hours.   isosorbide mononitrate 60 MG 24 hr tablet Commonly known as: IMDUR Take 1 tablet (60 mg total) by mouth daily.   sevelamer carbonate 800 MG tablet Commonly known as: RENVELA Take 2 tablets (1,600 mg total) by mouth 3 (three) times daily with meals.   torsemide 100 MG tablet Commonly known as: DEMADEX Take 1 tablet (100 mg total) by mouth every morning.        Follow-up Information     [name], MD Follow up in 1 week(s).   Specialty: Pulmonary Disease Contact information: [address] [place]  [address] [phone]                Allergies  Allergen Reactions   Bupropion Anxiety, Other (See Comments) and Nausea And Vomiting    Caused panic attacks  Caused panic attacks    "Adverse effects"   Prozac [Fluoxetine Hcl] Anxiety and Other (See Comments)  Caused panic attacks. Dizziness    Prednisone Other (See Comments)    Pt stated this med caused pancreatitis. Dizziness    Consultations: Nephrology   Procedures/Studies: CT Head Wo Contrast  Result Date: 12/04/2022 CLINICAL DATA:  Altered level of consciousness EXAM: CT HEAD WITHOUT CONTRAST TECHNIQUE: Contiguous axial images were obtained from the base of the skull through the vertex without intravenous contrast. RADIATION DOSE REDUCTION: This exam was performed according to the departmental dose-optimization program which includes automated exposure control, adjustment of the mA and/or kV according to patient size and/or use of iterative reconstruction technique. COMPARISON:  10/04/2022 FINDINGS: Brain: No acute infarct or hemorrhage. Lateral ventricles and midline structures are unremarkable. No acute extra-axial fluid collections. No mass effect. Vascular: No hyperdense vessel or unexpected calcification. Skull: Normal. Negative for fracture or focal  lesion. Sinuses/Orbits: No acute finding. Other: None. IMPRESSION: 1. Stable head CT, no acute intracranial process. Electronically Signed   By: Randa Ngo M.D.   On: 12/04/2022 16:13   DG Chest Portable 1 View  Result Date: 12/04/2022 CLINICAL DATA:  Altered mental status EXAM: PORTABLE CHEST 1 VIEW COMPARISON:  Chest radiograph dated 11/25/2022 FINDINGS: Low lung volumes. Bibasilar patchy opacities. Small bilateral pleural effusions. No pneumothorax. Similar enlarged cardiomediastinal silhouette. The visualized skeletal structures are unremarkable. IMPRESSION: 1. Low lung volumes with bibasilar patchy opacities, likely atelectasis. 2. Small bilateral pleural effusions.  Unchanged cardiomegaly. Electronically Signed   By: Darrin Nipper M.D.   On: 12/04/2022 14:49   DG Chest 2 View  Result Date: 11/26/2022 CLINICAL DATA:  Missed dialysis. Shortness of breath. EXAM: CHEST - 2 VIEW COMPARISON:  11/16/2022 FINDINGS: Stable cardiac enlargement. Persistent bilateral pleural effusions and overlying bibasilar atelectasis. Mild central vascular congestion but no overt pulmonary edema. IMPRESSION: Persistent cardiac enlargement, bilateral pleural effusions and overlying bibasilar atelectasis. Electronically Signed   By: Marijo Sanes M.D.   On: 11/26/2022 06:51   DG Chest 2 View  Result Date: 11/16/2022 CLINICAL DATA:  Hypoxia, exertional dyspnea, fatigue, LEFT side chest pain, symptoms for 1 day, history hypertension, CHF, end-stage renal disease, smoker EXAM: CHEST - 2 VIEW COMPARISON:  11/01/2022 FINDINGS: Enlargement of cardiac silhouette with pulmonary vascular congestion. Bibasilar effusions and atelectasis. No definite pulmonary edema or segmental consolidation identified. No pneumothorax. Osseous structures unremarkable. IMPRESSION: Enlargement of cardiac silhouette with pulmonary vascular congestion. Bibasilar pleural effusions and atelectasis again seen. Electronically Signed   By: Lavonia Dana M.D.    On: 11/16/2022 11:28   DG Ankle Complete Left  Result Date: 11/16/2022 CLINICAL DATA:  Pain EXAM: LEFT ANKLE COMPLETE - 3 VIEW; RIGHT ANKLE - COMPLETE 3 VIEW COMPARISON:  None Available. FINDINGS: There is no evidence of acute fracture, dislocation, or joint effusion. Well corticated ossification at the medial malleolus on the right may be related to prior trauma. There is no evidence of arthropathy or other focal bone abnormality. There is significant soft tissue swelling identified laterally on the left. IMPRESSION: Chronic posttraumatic changes right distal tibia. Soft tissue swelling lateral malleolus on the left. No acute osseous abnormalities. Electronically Signed   By: Sammie Bench M.D.   On: 11/16/2022 08:37   DG Ankle Complete Right  Result Date: 11/16/2022 CLINICAL DATA:  Pain EXAM: LEFT ANKLE COMPLETE - 3 VIEW; RIGHT ANKLE - COMPLETE 3 VIEW COMPARISON:  None Available. FINDINGS: There is no evidence of acute fracture, dislocation, or joint effusion. Well corticated ossification at the medial malleolus on the right may be related to prior trauma. There is no  evidence of arthropathy or other focal bone abnormality. There is significant soft tissue swelling identified laterally on the left. IMPRESSION: Chronic posttraumatic changes right distal tibia. Soft tissue swelling lateral malleolus on the left. No acute osseous abnormalities. Electronically Signed   By: Sammie Bench M.D.   On: 11/16/2022 08:37     Discharge Exam: Vitals:   12/07/22 1000 12/07/22 1030  BP: 130/75 (!) 147/86  Pulse: (!) 101 (!) 102  Resp: (!) 21   Temp:    SpO2: 95% 97%   Vitals:   12/07/22 0900 12/07/22 0930 12/07/22 1000 12/07/22 1030  BP: 132/71 (!) 147/78 130/75 (!) 147/86  Pulse: 99 (!) 102 (!) 101 (!) 102  Resp: 20 20 (!) 21   Temp:      TempSrc:      SpO2: 99% 96% 95% 97%  Weight:      Height:        General: Pt is alert, awake, not in acute distress Cardiovascular: RRR, S1/S2 +, no  rubs, no gallops Respiratory: CTA bilaterally, no wheezing, no rhonchi Abdominal: Soft, NT, ND, bowel sounds + Extremities: no edema, no cyanosis    The results of significant diagnostics from this hospitalization (including imaging, microbiology, ancillary and laboratory) are listed below for reference.     Microbiology: Recent Results (from the past 240 hour(s))  Resp panel by RT-PCR (RSV, Flu A&B, Covid) Anterior Nasal Swab     Status: None   Collection Time: 12/04/22  2:26 PM   Specimen: Anterior Nasal Swab  Result Value Ref Range Status   SARS Coronavirus 2 by RT PCR NEGATIVE NEGATIVE Final    Comment: (NOTE) SARS-CoV-2 target nucleic acids are NOT DETECTED.  The SARS-CoV-2 RNA is generally detectable in upper respiratory specimens during the acute phase of infection. The lowest concentration of SARS-CoV-2 viral copies this assay can detect is 138 copies/mL. A negative result does not preclude SARS-Cov-2 infection and should not be used as the sole basis for treatment or other patient management decisions. A negative result may occur with  improper specimen collection/handling, submission of specimen other than nasopharyngeal swab, presence of viral mutation(s) within the areas targeted by this assay, and inadequate number of viral copies(<138 copies/mL). A negative result must be combined with clinical observations, patient history, and epidemiological information. The expected result is Negative.  Fact Sheet for Patients:  EntrepreneurPulse.com.au  Fact Sheet for Healthcare Providers:  IncredibleEmployment.be  This test is no t yet approved or cleared by the [place] FDA and  has been authorized for detection and/or diagnosis of SARS-CoV-2 by FDA under an Emergency Use Authorization (EUA). This EUA will remain  in effect (meaning this test can be used) for the duration of the COVID-19 declaration under Section 564(b)(1) of the  Act, 21 U.S.C.section 360bbb-3(b)(1), unless the authorization is terminated  or revoked sooner.       Influenza A by PCR NEGATIVE NEGATIVE Final   Influenza B by PCR NEGATIVE NEGATIVE Final    Comment: (NOTE) The Xpert Xpress SARS-CoV-2/FLU/RSV plus assay is intended as an aid in the diagnosis of influenza from Nasopharyngeal swab specimens and should not be used as a sole basis for treatment. Nasal washings and aspirates are unacceptable for Xpert Xpress SARS-CoV-2/FLU/RSV testing.  Fact Sheet for Patients: EntrepreneurPulse.com.au  Fact Sheet for Healthcare Providers: IncredibleEmployment.be  This test is not yet approved or cleared by the [place] FDA and has been authorized for detection and/or diagnosis of SARS-CoV-2 by FDA under an Emergency Use  Authorization (EUA). This EUA will remain in effect (meaning this test can be used) for the duration of the COVID-19 declaration under Section 564(b)(1) of the Act, 21 U.S.C. section 360bbb-3(b)(1), unless the authorization is terminated or revoked.     Resp Syncytial Virus by PCR NEGATIVE NEGATIVE Final    Comment: (NOTE) Fact Sheet for Patients: EntrepreneurPulse.com.au  Fact Sheet for Healthcare Providers: IncredibleEmployment.be  This test is not yet approved or cleared by the [place] FDA and has been authorized for detection and/or diagnosis of SARS-CoV-2 by FDA under an Emergency Use Authorization (EUA). This EUA will remain in effect (meaning this test can be used) for the duration of the COVID-19 declaration under Section 564(b)(1) of the Act, 21 U.S.C. section 360bbb-3(b)(1), unless the authorization is terminated or revoked.  Performed at [facility], [place] [address], [place], [place] [address]      Labs: BNP (last 3 results) Recent Labs    09/17/22 0143 10/24/22 1530 11/02/22 0239  BNP 2,032.4* >4,500.0* 1,485.8*    Basic Metabolic Panel: Recent Labs  Lab 12/04/22 1126 12/04/22 1524 12/04/22 1623 12/04/22 2122 12/05/22 0305 12/06/22 0736 12/07/22 0811  NA 137   < > 141 138 136  137 134* 134*  K 6.7*   < > 4.3 6.5* 3.9  3.9 3.6 3.8  CL 95*   < > 118* 96* 92*  97* 96* 95*  CO2 13*  --   --  17* 22  24 25 24   GLUCOSE 100*   < > 72 146* 96  101* 102* 99  BUN 148*   < > 94* 155* 78*  77* 65* 89*  CREATININE 14.20*   < > 9.80* 14.34* 8.10*  8.06* 6.71* 7.89*  CALCIUM 9.2  --   --  8.4* 8.0*  8.1* 8.0* 8.8*  PHOS  --   --   --   --  6.9*  --  5.0*   < > = values in this interval not displayed.   Liver Function Tests: Recent Labs  Lab 12/04/22 1126 12/05/22 0305 12/07/22 0811  AST 74*  --   --   ALT 46*  --   --   ALKPHOS 115  --   --   BILITOT 1.2  --   --   PROT 7.6  --   --   ALBUMIN 3.1* 2.6* 2.2*   No results for input(s): "LIPASE", "AMYLASE" in the last 168 hours. No results for input(s): "AMMONIA" in the last 168 hours. CBC: Recent Labs  Lab 12/04/22 1126 12/04/22 1524 12/04/22 1623 12/04/22 2122 12/05/22 0305 12/07/22 0811  WBC 13.5*  --   --  10.5 8.8 10.2  NEUTROABS 11.8*  --   --   --   --   --   HGB 8.7* 10.9* 6.5* 9.1* 8.0* 8.1*  HCT 28.9* 32.0* 19.0* 28.8* 25.2* 26.1*  MCV 97.0  --   --  92.9 90.0 95.3  PLT 187  --   --  158 163 133*   Cardiac Enzymes: No results for input(s): "CKTOTAL", "CKMB", "CKMBINDEX", "TROPONINI" in the last 168 hours. BNP: Invalid input(s): "POCBNP" CBG: Recent Labs  Lab 12/04/22 1153  GLUCAP 98   D-Dimer No results for input(s): "DDIMER" in the last 72 hours. Hgb A1c No results for input(s): "HGBA1C" in the last 72 hours. Lipid Profile No results for input(s): "CHOL", "HDL", "LDLCALC", "TRIG", "CHOLHDL", "LDLDIRECT" in the last 72 hours. Thyroid function studies No results for input(s): "TSH", "T4TOTAL", "  T3FREE", "THYROIDAB" in the last 72 hours.  Invalid input(s): "FREET3" Anemia work up No results for  input(s): "VITAMINB12", "FOLATE", "FERRITIN", "TIBC", "IRON", "RETICCTPCT" in the last 72 hours. Urinalysis    Component Value Date/Time   COLORURINE YELLOW 06/02/2021 1221   APPEARANCEUR CLOUDY (A) 06/02/2021 1221   LABSPEC 1.010 06/02/2021 1221   PHURINE 7.0 06/02/2021 1221   GLUCOSEU 50 (A) 06/02/2021 1221   HGBUR NEGATIVE 06/02/2021 1221   BILIRUBINUR NEGATIVE 06/02/2021 1221   BILIRUBINUR negative 01/30/2020 1526   BILIRUBINUR neg 09/15/2017 1657   KETONESUR NEGATIVE 06/02/2021 1221   PROTEINUR 100 (A) 06/02/2021 1221   UROBILINOGEN 0.2 01/30/2020 1526   NITRITE NEGATIVE 06/02/2021 1221   LEUKOCYTESUR NEGATIVE 06/02/2021 1221   Sepsis Labs Recent Labs  Lab 12/04/22 1126 12/04/22 2122 12/05/22 0305 12/07/22 0811  WBC 13.5* 10.5 8.8 10.2   Microbiology Recent Results (from the past 240 hour(s))  Resp panel by RT-PCR (RSV, Flu A&B, Covid) Anterior Nasal Swab     Status: None   Collection Time: 12/04/22  2:26 PM   Specimen: Anterior Nasal Swab  Result Value Ref Range Status   SARS Coronavirus 2 by RT PCR NEGATIVE NEGATIVE Final    Comment: (NOTE) SARS-CoV-2 target nucleic acids are NOT DETECTED.  The SARS-CoV-2 RNA is generally detectable in upper respiratory specimens during the acute phase of infection. The lowest concentration of SARS-CoV-2 viral copies this assay can detect is 138 copies/mL. A negative result does not preclude SARS-Cov-2 infection and should not be used as the sole basis for treatment or other patient management decisions. A negative result may occur with  improper specimen collection/handling, submission of specimen other than nasopharyngeal swab, presence of viral mutation(s) within the areas targeted by this assay, and inadequate number of viral copies(<138 copies/mL). A negative result must be combined with clinical observations, patient history, and epidemiological information. The expected result is Negative.  Fact Sheet for Patients:   EntrepreneurPulse.com.au  Fact Sheet for Healthcare Providers:  IncredibleEmployment.be  This test is no t yet approved or cleared by the [place] FDA and  has been authorized for detection and/or diagnosis of SARS-CoV-2 by FDA under an Emergency Use Authorization (EUA). This EUA will remain  in effect (meaning this test can be used) for the duration of the COVID-19 declaration under Section 564(b)(1) of the Act, 21 U.S.C.section 360bbb-3(b)(1), unless the authorization is terminated  or revoked sooner.       Influenza A by PCR NEGATIVE NEGATIVE Final   Influenza B by PCR NEGATIVE NEGATIVE Final    Comment: (NOTE) The Xpert Xpress SARS-CoV-2/FLU/RSV plus assay is intended as an aid in the diagnosis of influenza from Nasopharyngeal swab specimens and should not be used as a sole basis for treatment. Nasal washings and aspirates are unacceptable for Xpert Xpress SARS-CoV-2/FLU/RSV testing.  Fact Sheet for Patients: EntrepreneurPulse.com.au  Fact Sheet for Healthcare Providers: IncredibleEmployment.be  This test is not yet approved or cleared by the [place] FDA and has been authorized for detection and/or diagnosis of SARS-CoV-2 by FDA under an Emergency Use Authorization (EUA). This EUA will remain in effect (meaning this test can be used) for the duration of the COVID-19 declaration under Section 564(b)(1) of the Act, 21 U.S.C. section 360bbb-3(b)(1), unless the authorization is terminated or revoked.     Resp Syncytial Virus by PCR NEGATIVE NEGATIVE Final    Comment: (NOTE) Fact Sheet for Patients: EntrepreneurPulse.com.au  Fact Sheet for Healthcare Providers: IncredibleEmployment.be  This test is not yet approved  or cleared by the [place] and has been authorized for detection and/or diagnosis of SARS-CoV-2 by FDA under an Emergency Use  Authorization (EUA). This EUA will remain in effect (meaning this test can be used) for the duration of the COVID-19 declaration under Section 564(b)(1) of the Act, 21 U.S.C. section 360bbb-3(b)(1), unless the authorization is terminated or revoked.  Performed at [facility], [place] [address] [place], [place] [address]      Time coordinating discharge: Over 30 minutes  SIGNED:   Darliss Cheney, MD  [facility] 12/07/2022, 10:40 AM *Please note that this is a verbal dictation therefore any spelling or grammatical errors are due to the "[place]" system interpretation. If 7PM-7AM, please contact night-coverage [email or web address]

## 2022-12-07 NOTE — Progress Notes (Signed)
   12/07/22 1159  Vitals  Temp (!) 97.5 F (36.4 C)  Pulse Rate 100  Resp 16  BP (!) 149/91  O2 Device Room Air  Weight 84.2 kg  Type of Weight Post-Dialysis  Oxygen Therapy  Patient Activity (if Appropriate) In bed  Post Treatment  Dialyzer Clearance Lightly streaked  Duration of HD Treatment -hour(s) 3.5 hour(s)  Liters Processed 73.5  Fluid Removed (mL) 2.5 mL  Tolerated HD Treatment Yes  AVG/AVF Arterial Site Held (minutes) 5 minutes  AVG/AVF Venous Site Held (minutes) 5 minutes   Received patient in bed to unit.  Alert and oriented.  Informed consent signed and in chart.     Patient tolerated well.  Transported back to the room  Alert, without acute distress.  Hand-off given to patient's nurse.   Access used: LUA AVF Access issues: none  Total UF removed: 2.5L Medication(s) given: none    [facility]

## 2022-12-07 NOTE — Progress Notes (Incomplete)
Subjective:  ***  Objective Vital signs in last 24 hours: Vitals:   12/07/22 0809 12/07/22 0826 12/07/22 0830 12/07/22 0900  BP: (!) 144/84 (!) 147/87 (!) 147/87 132/71  Pulse: 99 (!) 106 (!) 105 99  Resp: (!) 21 (!) 25 20 20   Temp: 97.7 F (36.5 C)     TempSrc: Axillary     SpO2: 96% 99% 99% 99%  Weight: 86.5 kg     Height:       Weight change:   Physical Exam: General: *** Heart: *** Lungs: *** Abdomen: *** Extremities: Dialysis Access: ***   Problem/Plan:  *** ESRD - *** Anemia - *** Secondary hyperparathyroidism - *** HTN/volume - ***  Ernest Haber, PA-C [facility] [phone] 12/07/2022,9:08 AM  LOS: 3 days   Labs: Basic Metabolic Panel: Recent Labs  Lab 12/05/22 0305 12/06/22 0736 12/07/22 0811  NA 136  137 134* 134*  K 3.9  3.9 3.6 3.8  CL 92*  97* 96* 95*  CO2 22  24 25 24   GLUCOSE 96  101* 102* 99  BUN 78*  77* 65* 89*  CREATININE 8.10*  8.06* 6.71* 7.89*  CALCIUM 8.0*  8.1* 8.0* 8.8*  PHOS 6.9*  --  5.0*   Liver Function Tests: Recent Labs  Lab 12/04/22 1126 12/05/22 0305 12/07/22 0811  AST 74*  --   --   ALT 46*  --   --   ALKPHOS 115  --   --   BILITOT 1.2  --   --   PROT 7.6  --   --   ALBUMIN 3.1* 2.6* 2.2*   No results for input(s): "LIPASE", "AMYLASE" in the last 168 hours. No results for input(s): "AMMONIA" in the last 168 hours. CBC: Recent Labs  Lab 12/04/22 1126 12/04/22 1524 12/04/22 2122 12/05/22 0305 12/07/22 0811  WBC 13.5*  --  10.5 8.8 10.2  NEUTROABS 11.8*  --   --   --   --   HGB 8.7*   < > 9.1* 8.0* 8.1*  HCT 28.9*   < > 28.8* 25.2* 26.1*  MCV 97.0  --  92.9 90.0 95.3  PLT 187  --  158 163 133*   < > = values in this interval not displayed.   Cardiac Enzymes: No results for input(s): "CKTOTAL", "CKMB", "CKMBINDEX", "TROPONINI" in the last 168 hours. CBG: Recent Labs  Lab 12/04/22 1153  GLUCAP 98    Studies/Results: No results found. Medications:  sodium chloride       heparin sodium (porcine)       ARIPiprazole  7.5 mg Oral Daily   calcium acetate  667 mg Oral TID WC   carvedilol  6.25 mg Oral BID WC   Chlorhexidine Gluconate Cloth  6 each Topical Q0600   darbepoetin (ARANESP) injection - DIALYSIS  200 mcg Subcutaneous Q Mon-1800   heparin  5,000 Units Subcutaneous Q8H   hydrALAZINE  50 mg Oral Q8H   isosorbide mononitrate  60 mg Oral Daily   sevelamer carbonate  1,600 mg Oral TID WC   sodium bicarbonate  50 mEq Intravenous Once   sodium chloride flush  3 mL Intravenous Q12H   sodium chloride flush  3 mL Intravenous Q12H   torsemide  100 mg Oral q morning

## 2022-12-07 NOTE — Progress Notes (Signed)
Pt received in bed resting no c/os no distress noted, stable for HD via LUA AVF UFG 2.5L

## 2022-12-07 NOTE — Progress Notes (Signed)
D/C order noted. Contacted [facility] to advise clinic of pt's d/c today and that pt should resume care on Wednesday.   Aarianna Hoadley Renal Navigator [phone] 

## 2022-12-07 NOTE — Progress Notes (Signed)
Subjective: Seen in hemodialysis, minimal conversation but does await denies any chest pain shortness of breath, follows commands  Objective Vital signs in last 24 hours: Vitals:   12/07/22 0826 12/07/22 0830 12/07/22 0900 12/07/22 0930  BP: (!) 147/87 (!) 147/87 132/71 (!) 147/78  Pulse: (!) 106 (!) 105 99 (!) 102  Resp: (!) 25 20 20 20   Temp:      TempSrc:      SpO2: 99% 99% 99% 96%  Weight:      Height:       Weight change:   Physical Exam: General: Young adult female NAD , little verbal interaction  Heart: RRR, No mrg Lungs: CTA  bilat. Nonlabored  Abdomen: NABS, soft, NT, ND  Extremities: trace bilat  pedal edema Dialysis Access: Patent on HD LUA AVF   Dialysis Orders:  [facility] MWF 4:00 400/800 EDW 82.9kg 2K/2Ca AVF Heparin 5000 Last OP dialysis 11/13/22  -Mircera 200 q 2 wks  -Calcitriol 0.5 TIW Calcium acetate   Problem/Plan: AMS secondary to uremia secondary to noncompliance with HD has improved to baseline-to be discharged today ESRD -has hyperkalemia uremia secondary to noncompliance, had HD 106, 107 and today back on schedule Anemia -Hgb 8.1,, orders to be given Aranesp 200 today Secondary hyperparathyroidism -5.0, calcium corrected slightly high, follow-up trend as outpatient and continue calcitriol and binder HTN/volume -BP improved with UF and continued home meds Coreg, hydralazine and Imdur PTSD/borderline personality disorder-on Abilify per admit team 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 secondary to noncompliance with HD as noted by admit team last discharge 11/11/22 seen by cardiology and cardiac surgery all opinionated patient did not need pericardial drainage continue management with HD  Ernest Haber, PA-C [place] [phone] 12/07/2022,9:45 AM  LOS: 3 days   Labs: Basic Metabolic Panel: Recent Labs  Lab 12/05/22 0305 12/06/22 0736 12/07/22 0811  NA 136  137 134* 134*  K 3.9  3.9 3.6 3.8   CL 92*  97* 96* 95*  CO2 22  24 25 24   GLUCOSE 96  101* 102* 99  BUN 78*  77* 65* 89*  CREATININE 8.10*  8.06* 6.71* 7.89*  CALCIUM 8.0*  8.1* 8.0* 8.8*  PHOS 6.9*  --  5.0*   Liver Function Tests: Recent Labs  Lab 12/04/22 1126 12/05/22 0305 12/07/22 0811  AST 74*  --   --   ALT 46*  --   --   ALKPHOS 115  --   --   BILITOT 1.2  --   --   PROT 7.6  --   --   ALBUMIN 3.1* 2.6* 2.2*   No results for input(s): "LIPASE", "AMYLASE" in the last 168 hours. No results for input(s): "AMMONIA" in the last 168 hours. CBC: Recent Labs  Lab 12/04/22 1126 12/04/22 1524 12/04/22 2122 12/05/22 0305 12/07/22 0811  WBC 13.5*  --  10.5 8.8 10.2  NEUTROABS 11.8*  --   --   --   --   HGB 8.7*   < > 9.1* 8.0* 8.1*  HCT 28.9*   < > 28.8* 25.2* 26.1*  MCV 97.0  --  92.9 90.0 95.3  PLT 187  --  158 163 133*   < > = values in this interval not displayed.   Cardiac Enzymes: No results for input(s): "CKTOTAL", "CKMB", "CKMBINDEX", "TROPONINI" in the last 168 hours. CBG: Recent Labs  Lab 12/04/22 1153  GLUCAP 98    Studies/Results: No results found. Medications:  sodium  chloride      heparin sodium (porcine)       ARIPiprazole  7.5 mg Oral Daily   calcium acetate  667 mg Oral TID WC   carvedilol  6.25 mg Oral BID WC   Chlorhexidine Gluconate Cloth  6 each Topical Q0600   darbepoetin (ARANESP) injection - DIALYSIS  200 mcg Subcutaneous Q Mon-1800   heparin  5,000 Units Subcutaneous Q8H   hydrALAZINE  50 mg Oral Q8H   isosorbide mononitrate  60 mg Oral Daily   sevelamer carbonate  1,600 mg Oral TID WC   sodium bicarbonate  50 mEq Intravenous Once   sodium chloride flush  3 mL Intravenous Q12H   sodium chloride flush  3 mL Intravenous Q12H   torsemide  100 mg Oral q morning

## 2022-12-07 NOTE — Plan of Care (Signed)
  Problem: Clinical Measurements: Goal: Ability to maintain clinical measurements within normal limits will improve Outcome: Progressing Goal: Will remain free from infection Outcome: Progressing Goal: Respiratory complications will improve Outcome: Progressing Goal: Cardiovascular complication will be avoided Outcome: Progressing   Problem: Activity: Goal: Risk for activity intolerance will decrease Outcome: Progressing   

## 2022-12-08 ENCOUNTER — Telehealth: Payer: Self-pay

## 2022-12-08 ENCOUNTER — Emergency Department (HOSPITAL_COMMUNITY)
Admission: EM | Admit: 2022-12-08 | Discharge: 2022-12-09 | Disposition: A | Discharge status: Other Acute Inpatient Hospital | Payer: Medicaid Other | Attending: Emergency Medicine | Admitting: Emergency Medicine

## 2022-12-08 ENCOUNTER — Other Ambulatory Visit (HOSPITAL_COMMUNITY): Payer: Self-pay

## 2022-12-08 ENCOUNTER — Other Ambulatory Visit: Payer: Self-pay

## 2022-12-08 ENCOUNTER — Encounter (HOSPITAL_COMMUNITY): Payer: Self-pay

## 2022-12-08 DIAGNOSIS — Z1152 Encounter for screening for COVID-19: Secondary | ICD-10-CM | POA: Insufficient documentation

## 2022-12-08 DIAGNOSIS — J45909 Unspecified asthma, uncomplicated: Secondary | ICD-10-CM | POA: Diagnosis not present

## 2022-12-08 DIAGNOSIS — N186 End stage renal disease: Secondary | ICD-10-CM | POA: Diagnosis not present

## 2022-12-08 DIAGNOSIS — R45851 Suicidal ideations: Secondary | ICD-10-CM | POA: Diagnosis not present

## 2022-12-08 DIAGNOSIS — R000 Tachycardia, unspecified: Secondary | ICD-10-CM | POA: Insufficient documentation

## 2022-12-08 DIAGNOSIS — I132 Hypertensive heart and chronic kidney disease with heart failure and with stage 5 chronic kidney disease, or end stage renal disease: Secondary | ICD-10-CM | POA: Diagnosis not present

## 2022-12-08 DIAGNOSIS — I509 Heart failure, unspecified: Secondary | ICD-10-CM | POA: Insufficient documentation

## 2022-12-08 DIAGNOSIS — F332 Major depressive disorder, recurrent severe without psychotic features: Secondary | ICD-10-CM | POA: Diagnosis present

## 2022-12-08 DIAGNOSIS — F17210 Nicotine dependence, cigarettes, uncomplicated: Secondary | ICD-10-CM | POA: Diagnosis not present

## 2022-12-08 DIAGNOSIS — Z992 Dependence on renal dialysis: Secondary | ICD-10-CM | POA: Diagnosis not present

## 2022-12-08 LAB — COMPREHENSIVE METABOLIC PANEL
ALT: 32 U/L (ref 0–44)
AST: 43 U/L — ABNORMAL HIGH (ref 15–41)
Albumin: 2.5 g/dL — ABNORMAL LOW (ref 3.5–5.0)
Alkaline Phosphatase: 107 U/L (ref 38–126)
Anion gap: 14 (ref 5–15)
BUN: 73 mg/dL — ABNORMAL HIGH (ref 6–20)
CO2: 26 mmol/L (ref 22–32)
Calcium: 9 mg/dL (ref 8.9–10.3)
Chloride: 96 mmol/L — ABNORMAL LOW (ref 98–111)
Creatinine, Ser: 6.08 mg/dL — ABNORMAL HIGH (ref 0.44–1.00)
GFR, Estimated: 9 mL/min — ABNORMAL LOW (ref >60)
Glucose, Bld: 111 mg/dL — ABNORMAL HIGH (ref 70–99)
Potassium: 3.9 mmol/L (ref 3.5–5.1)
Sodium: 136 mmol/L (ref 135–145)
Total Bilirubin: 0.6 mg/dL (ref 0.3–1.2)
Total Protein: 6.6 g/dL (ref 6.5–8.1)

## 2022-12-08 LAB — CBC WITH DIFFERENTIAL/PLATELET
Abs Immature Granulocytes: 0.04 10*3/uL (ref 0.00–0.07)
Basophils Absolute: 0 10*3/uL (ref 0.0–0.1)
Basophils Relative: 0 %
Eosinophils Absolute: 0.1 10*3/uL (ref 0.0–0.5)
Eosinophils Relative: 1 %
HCT: 25.2 % — ABNORMAL LOW (ref 36.0–46.0)
Hemoglobin: 7.5 g/dL — ABNORMAL LOW (ref 12.0–15.0)
Immature Granulocytes: 0 %
Lymphocytes Relative: 11 %
Lymphs Abs: 1.1 10*3/uL (ref 0.7–4.0)
MCH: 29.2 pg (ref 26.0–34.0)
MCHC: 29.8 g/dL — ABNORMAL LOW (ref 30.0–36.0)
MCV: 98.1 fL (ref 80.0–100.0)
Monocytes Absolute: 0.7 10*3/uL (ref 0.1–1.0)
Monocytes Relative: 6 %
Neutro Abs: 8.8 10*3/uL — ABNORMAL HIGH (ref 1.7–7.7)
Neutrophils Relative %: 82 %
Platelets: 142 10*3/uL — ABNORMAL LOW (ref 150–400)
RBC: 2.57 MIL/uL — ABNORMAL LOW (ref 3.87–5.11)
RDW: 20.5 % — ABNORMAL HIGH (ref 11.5–15.5)
WBC: 10.8 10*3/uL — ABNORMAL HIGH (ref 4.0–10.5)
nRBC: 0 % (ref 0.0–0.2)

## 2022-12-08 LAB — I-STAT BETA HCG BLOOD, ED (MC, WL, AP ONLY): I-stat hCG, quantitative: <5 m[IU]/mL (ref <5)

## 2022-12-08 LAB — ETHANOL: Alcohol, Ethyl (B): <10 mg/dL (ref <10)

## 2022-12-08 MED ORDER — ACETAMINOPHEN 500 MG PO TABS
1000.0000 mg | ORAL_TABLET | Freq: Once | ORAL | Status: AC
  Administered 2022-12-08: 1000 mg via ORAL
  Filled 2022-12-08: qty 2

## 2022-12-08 NOTE — Telephone Encounter (Signed)
Transition Care Management Follow-up Telephone Call Date of discharge and from where: 12/07/2022, [facility]. She is currently in the ED at [facility]

## 2022-12-08 NOTE — ED Notes (Signed)
Belonging placed in locker number 5

## 2022-12-08 NOTE — BH Assessment (Addendum)
@  2149, Clinician reached out to patient's nurse Hildred Alamin, RN) to request for the tele-assessment machine to be set up for patient's initial TTS assessment.
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
## 2022-12-08 NOTE — ED Provider Triage Note (Signed)
Emergency Medicine Provider Triage Evaluation Note  Natasha Long , a 27 y.o. female  was evaluated in triage.  Pt complains of .feeling suicidal.  Pt reports planning to cut herself.  Pt had dialysis yesterday   Review of Systems  Positive: depression Negative: Cough   Physical Exam  BP (!) 163/88 (BP Location: Left Arm)   Pulse (!) 111   Temp 98.9 F (37.2 C)   Resp 16   Ht 5\' 8"  (1.727 m)   Wt 84.2 kg   LMP  (LMP Unknown)   SpO2 97%   BMI 28.22 kg/m  Gen:   Awake, no distress   Resp:  Normal effort  MSK:   Moves extremities without difficulty  Other:    Medical Decision Making  Medically screening exam initiated at 12:21 PM.  Appropriate orders placed.  Beata Mckelvie was informed that the remainder of the evaluation will be completed by another provider, this initial triage assessment does not replace that evaluation, and the importance of remaining in the ED until their evaluation is complete.     Fransico Meadow, [place] 12/08/22 1223

## 2022-12-08 NOTE — ED Notes (Signed)
Provided patient with turkey sandwhich

## 2022-12-08 NOTE — ED Provider Notes (Signed)
[facility] EMERGENCY DEPARTMENT Provider Note   CSN: [id number] Arrival date & time: 12/08/22  1127     History  Chief Complaint  Patient presents with   Suicidal    Natasha Long is a 27 y.o. female.  HPI   Patient with medical history of end-stage renal disease on dialysis Monday, Wednesday, Friday, hypertension, borderline personality disorder, substance use disorder, MDD presents to the emergency department due to suicidal ideations.  It started this morning, she has plans to slit her own wrist.  States she called her boyfriend who told her to the ED for additional evaluation.  Patient states she has been medically compliant with her dialysis, she was hospitalized 1/4-1/9, last dialysis was yesterday at discharge.   Home Medications Prior to Admission medications   Medication Sig Start Date End Date Taking? Authorizing Provider  ARIPiprazole (ABILIFY) 5 MG tablet Take 1.5 tablets (7.5 mg total) by mouth daily. 12/07/22   Darliss Cheney, MD  calcitRIOL (ROCALTROL) 0.25 MCG capsule Take 1 capsule (0.25 mcg total) by mouth Every Tuesday,Thursday,and Saturday with dialysis. 12/08/22   Darliss Cheney, MD  calcium acetate (PHOSLO) 667 MG capsule Take 1 capsule (667 mg total) by mouth 3 (three) times daily with meals. 12/07/22   Darliss Cheney, MD  carvedilol (COREG) 6.25 MG tablet Take 1 tablet (6.25 mg total) by mouth 2 (two) times daily with a meal. 12/07/22 01/06/23  Darliss Cheney, MD  hydrALAZINE (APRESOLINE) 50 MG tablet Take 1 tablet (50 mg total) by mouth every 8 (eight) hours. 12/07/22 01/06/23  Darliss Cheney, MD  isosorbide mononitrate (IMDUR) 60 MG 24 hr tablet Take 1 tablet (60 mg total) by mouth daily. 12/07/22 01/06/23  Darliss Cheney, MD  sevelamer carbonate (RENVELA) 800 MG tablet Take 2 tablets (1,600 mg total) by mouth 3 (three) times daily with meals. 12/07/22   Darliss Cheney, MD  torsemide (DEMADEX) 100 MG tablet Take 1 tablet (100 mg total) by mouth every morning.  12/07/22   Darliss Cheney, MD      Allergies    Bupropion, Prozac [fluoxetine hcl], and Prednisone    Review of Systems   Review of Systems  Physical Exam Updated Vital Signs BP (!) 145/96 (BP Location: Right Arm)   Pulse (!) 112   Temp 98.1 F (36.7 C) (Oral)   Resp 19   Ht 5\' 8"  (1.727 m)   Wt 84.2 kg   LMP  (LMP Unknown)   SpO2 100%   BMI 28.22 kg/m  Physical Exam Vitals and nursing note reviewed. Exam conducted with a chaperone present.  Constitutional:      Appearance: Normal appearance.     Comments: No acute distress, eating sandwiches.  HENT:     Head: Normocephalic and atraumatic.  Eyes:     General: No scleral icterus.       Right eye: No discharge.        Left eye: No discharge.     Extraocular Movements: Extraocular movements intact.     Pupils: Pupils are equal, round, and reactive to light.  Cardiovascular:     Rate and Rhythm: Regular rhythm. Tachycardia present.     Pulses: Normal pulses.     Heart sounds: Normal heart sounds. No murmur heard.    No friction rub. No gallop.  Pulmonary:     Effort: Pulmonary effort is normal. No respiratory distress.     Breath sounds: Normal breath sounds.  Abdominal:     General: Abdomen is flat. Bowel sounds are  normal. There is no distension.     Palpations: Abdomen is soft.     Tenderness: There is no abdominal tenderness.  Skin:    General: Skin is warm and dry.     Coloration: Skin is not jaundiced.     Comments: LUE AV fistula with palpable thrill   Neurological:     Mental Status: She is alert. Mental status is at baseline.     Coordination: Coordination normal.     ED Results / Procedures / Treatments   Labs (all labs ordered are listed, but only abnormal results are displayed) Labs Reviewed  COMPREHENSIVE METABOLIC PANEL - Abnormal; Notable for the following components:      Result Value   Chloride 96 (*)    Glucose, Bld 111 (*)    BUN 73 (*)    Creatinine, Ser 6.08 (*)    Albumin 2.5 (*)     AST 43 (*)    GFR, Estimated 9 (*)    All other components within normal limits  CBC WITH DIFFERENTIAL/PLATELET - Abnormal; Notable for the following components:   WBC 10.8 (*)    RBC 2.57 (*)    Hemoglobin 7.5 (*)    HCT 25.2 (*)    MCHC 29.8 (*)    RDW 20.5 (*)    Platelets 142 (*)    Neutro Abs 8.8 (*)    All other components within normal limits  ETHANOL  RAPID URINE DRUG SCREEN, HOSP PERFORMED  I-STAT BETA HCG BLOOD, ED ([facility], [facility], [facility] ONLY)    EKG None  Radiology No results found.  Procedures Procedures    Medications Ordered in ED Medications - No data to display  ED Course/ Medical Decision Making/ A&P Clinical Course as of 12/08/22 1721  Tue Dec 08, 2022  1714 I-Stat beta hCG blood, ED Not pregnant  [HS]  1714 Ethanol Low  [HS]  1714 CBC with Diff(!) Very mild leukocytosis at 10.8.  Hemoglobin is at baseline, 7.88.  Secondary most likely to end-stage renal disease, no indication for transfusion. [HS]  0867 Comprehensive metabolic panel(!) No gross electrolyte abnormality, creatinine 6.08.  No anion gap, not acidotic [HS]    Clinical Course User Index [HS] Sherrill Raring, PA-C                           Medical Decision Making Amount and/or Complexity of Data Reviewed Labs:  Decision-making details documented in ED Course.   Patient presents to the emergency department due to suicidal ideations.  I reviewed external medical records including hospitalization and as documented in the HPI.  I ordered, viewed and interpreted laboratory workup as documented ED course.  Patient does not need admission from a medical standpoint.  At this time I do feel she is medically cleared and appropriate for TTS evaluation.  No indications for emergent dialysis tonight.    I consulted nephrology and spoke to Dr. Marval Regal who will add the patient to the list for dialysis in the morning.        Final Clinical Impression(s) / ED Diagnoses Final diagnoses:  None     Rx / DC Orders ED Discharge Orders     None         Sherrill Raring, PA-C 12/08/22 1721    Drenda Freeze, MD 12/08/22 [phone]

## 2022-12-08 NOTE — ED Notes (Signed)
Pt is not ivc for now voluntary form attached to the clip board in orange zone and report given to aryah

## 2022-12-08 NOTE — ED Notes (Signed)
Pt changing now.

## 2022-12-08 NOTE — ED Triage Notes (Signed)
Pt states she would like to slit her wrist. Pt states she been feeling this way all day

## 2022-12-09 ENCOUNTER — Inpatient Hospital Stay
Admission: AD | Admit: 2022-12-09 | Discharge: 2022-12-11 | DRG: 885 | Disposition: A | Discharge status: Other Acute Inpatient Hospital | Payer: Medicaid Other | Source: Intra-hospital | Attending: Psychiatry | Admitting: Psychiatry

## 2022-12-09 ENCOUNTER — Other Ambulatory Visit: Payer: Self-pay

## 2022-12-09 ENCOUNTER — Ambulatory Visit: Admitting: Critical Care Medicine

## 2022-12-09 ENCOUNTER — Encounter: Payer: Self-pay | Admitting: Psychiatry

## 2022-12-09 DIAGNOSIS — I5020 Unspecified systolic (congestive) heart failure: Secondary | ICD-10-CM | POA: Diagnosis not present

## 2022-12-09 DIAGNOSIS — F1190 Opioid use, unspecified, uncomplicated: Secondary | ICD-10-CM | POA: Diagnosis not present

## 2022-12-09 DIAGNOSIS — F17210 Nicotine dependence, cigarettes, uncomplicated: Secondary | ICD-10-CM | POA: Diagnosis present

## 2022-12-09 DIAGNOSIS — G4700 Insomnia, unspecified: Secondary | ICD-10-CM | POA: Diagnosis present

## 2022-12-09 DIAGNOSIS — R45851 Suicidal ideations: Secondary | ICD-10-CM | POA: Diagnosis present

## 2022-12-09 DIAGNOSIS — M5440 Lumbago with sciatica, unspecified side: Secondary | ICD-10-CM | POA: Diagnosis not present

## 2022-12-09 DIAGNOSIS — M25522 Pain in left elbow: Secondary | ICD-10-CM | POA: Diagnosis not present

## 2022-12-09 DIAGNOSIS — F1423 Cocaine dependence with withdrawal: Secondary | ICD-10-CM | POA: Diagnosis present

## 2022-12-09 DIAGNOSIS — I2089 Other forms of angina pectoris: Secondary | ICD-10-CM | POA: Diagnosis present

## 2022-12-09 DIAGNOSIS — F32A Depression, unspecified: Secondary | ICD-10-CM | POA: Diagnosis not present

## 2022-12-09 DIAGNOSIS — D631 Anemia in chronic kidney disease: Secondary | ICD-10-CM | POA: Diagnosis present

## 2022-12-09 DIAGNOSIS — Z9151 Personal history of suicidal behavior: Secondary | ICD-10-CM

## 2022-12-09 DIAGNOSIS — M25521 Pain in right elbow: Secondary | ICD-10-CM | POA: Diagnosis not present

## 2022-12-09 DIAGNOSIS — Z5900 Homelessness unspecified: Secondary | ICD-10-CM

## 2022-12-09 DIAGNOSIS — F1220 Cannabis dependence, uncomplicated: Secondary | ICD-10-CM | POA: Diagnosis present

## 2022-12-09 DIAGNOSIS — R42 Dizziness and giddiness: Secondary | ICD-10-CM | POA: Diagnosis not present

## 2022-12-09 DIAGNOSIS — F332 Major depressive disorder, recurrent severe without psychotic features: Secondary | ICD-10-CM | POA: Diagnosis not present

## 2022-12-09 DIAGNOSIS — G629 Polyneuropathy, unspecified: Secondary | ICD-10-CM | POA: Diagnosis present

## 2022-12-09 DIAGNOSIS — F4310 Post-traumatic stress disorder, unspecified: Secondary | ICD-10-CM | POA: Diagnosis present

## 2022-12-09 DIAGNOSIS — Z20822 Contact with and (suspected) exposure to covid-19: Secondary | ICD-10-CM | POA: Diagnosis present

## 2022-12-09 DIAGNOSIS — E669 Obesity, unspecified: Secondary | ICD-10-CM | POA: Diagnosis not present

## 2022-12-09 DIAGNOSIS — N186 End stage renal disease: Secondary | ICD-10-CM | POA: Diagnosis present

## 2022-12-09 DIAGNOSIS — Z91158 Patient's noncompliance with renal dialysis for other reason: Secondary | ICD-10-CM | POA: Diagnosis not present

## 2022-12-09 DIAGNOSIS — F1494 Cocaine use, unspecified with cocaine-induced mood disorder: Secondary | ICD-10-CM | POA: Diagnosis present

## 2022-12-09 DIAGNOSIS — D649 Anemia, unspecified: Secondary | ICD-10-CM | POA: Diagnosis not present

## 2022-12-09 DIAGNOSIS — D509 Iron deficiency anemia, unspecified: Secondary | ICD-10-CM | POA: Diagnosis present

## 2022-12-09 DIAGNOSIS — R197 Diarrhea, unspecified: Secondary | ICD-10-CM | POA: Diagnosis present

## 2022-12-09 DIAGNOSIS — Z992 Dependence on renal dialysis: Secondary | ICD-10-CM | POA: Diagnosis not present

## 2022-12-09 DIAGNOSIS — F603 Borderline personality disorder: Secondary | ICD-10-CM | POA: Diagnosis present

## 2022-12-09 DIAGNOSIS — I132 Hypertensive heart and chronic kidney disease with heart failure and with stage 5 chronic kidney disease, or end stage renal disease: Secondary | ICD-10-CM | POA: Diagnosis present

## 2022-12-09 DIAGNOSIS — I10 Essential (primary) hypertension: Secondary | ICD-10-CM | POA: Diagnosis present

## 2022-12-09 DIAGNOSIS — M792 Neuralgia and neuritis, unspecified: Secondary | ICD-10-CM | POA: Diagnosis not present

## 2022-12-09 DIAGNOSIS — E1122 Type 2 diabetes mellitus with diabetic chronic kidney disease: Secondary | ICD-10-CM | POA: Diagnosis present

## 2022-12-09 LAB — RESP PANEL BY RT-PCR (RSV, FLU A&B, COVID)  RVPGX2
Influenza A by PCR: NEGATIVE
Influenza B by PCR: NEGATIVE
Resp Syncytial Virus by PCR: NEGATIVE
SARS Coronavirus 2 by RT PCR: NEGATIVE

## 2022-12-09 MED ORDER — HYDRALAZINE HCL 50 MG PO TABS
50.0000 mg | ORAL_TABLET | Freq: Three times a day (TID) | ORAL | Status: DC
  Administered 2022-12-09 – 2022-12-11 (×4): 50 mg via ORAL
  Filled 2022-12-09 (×7): qty 1

## 2022-12-09 MED ORDER — MAGNESIUM HYDROXIDE 400 MG/5ML PO SUSP: 30.0000 mL | Freq: Every day | ORAL | Status: DC | PRN

## 2022-12-09 MED ORDER — ALUM & MAG HYDROXIDE-SIMETH 200-200-20 MG/5ML PO SUSP: 30.0000 mL | ORAL | Status: DC | PRN

## 2022-12-09 MED ORDER — CALCITRIOL 0.25 MCG PO CAPS
0.2500 ug | ORAL_CAPSULE | ORAL | Status: DC
  Administered 2022-12-10: 0.25 ug via ORAL
  Filled 2022-12-09: qty 1

## 2022-12-09 MED ORDER — ISOSORBIDE MONONITRATE ER 60 MG PO TB24
60.0000 mg | ORAL_TABLET | Freq: Every day | ORAL | Status: DC
  Administered 2022-12-11: 60 mg via ORAL
  Filled 2022-12-09 (×2): qty 1

## 2022-12-09 MED ORDER — DARBEPOETIN ALFA 200 MCG/0.4ML IJ SOSY: 200.0000 ug | PREFILLED_SYRINGE | INTRAMUSCULAR | Status: DC

## 2022-12-09 MED ORDER — CALCIUM ACETATE (PHOS BINDER) 667 MG PO CAPS
667.0000 mg | ORAL_CAPSULE | Freq: Three times a day (TID) | ORAL | Status: DC
  Administered 2022-12-10 – 2022-12-11 (×5): 667 mg via ORAL
  Filled 2022-12-09 (×6): qty 1

## 2022-12-09 MED ORDER — ACETAMINOPHEN 325 MG PO TABS
650.0000 mg | ORAL_TABLET | Freq: Four times a day (QID) | ORAL | Status: DC | PRN
  Administered 2022-12-10 – 2022-12-11 (×2): 650 mg via ORAL
  Filled 2022-12-09 (×4): qty 2

## 2022-12-09 MED ORDER — TORSEMIDE 100 MG PO TABS
100.0000 mg | ORAL_TABLET | Freq: Every morning | ORAL | Status: DC
  Filled 2022-12-09 (×2): qty 1

## 2022-12-09 MED ORDER — SEVELAMER CARBONATE 800 MG PO TABS
1600.0000 mg | ORAL_TABLET | Freq: Three times a day (TID) | ORAL | Status: DC
  Administered 2022-12-10 – 2022-12-11 (×4): 1600 mg via ORAL
  Filled 2022-12-09 (×6): qty 2

## 2022-12-09 MED ORDER — HEPARIN SODIUM (PORCINE) 1000 UNIT/ML IJ SOLN
INTRAMUSCULAR | Status: AC
  Filled 2022-12-09: qty 5

## 2022-12-09 MED ORDER — PENTAFLUOROPROP-TETRAFLUOROETH EX AERO
INHALATION_SPRAY | CUTANEOUS | Status: AC
  Filled 2022-12-09: qty 30

## 2022-12-09 MED ORDER — ACETAMINOPHEN 500 MG PO TABS
1000.0000 mg | ORAL_TABLET | Freq: Once | ORAL | Status: AC
  Administered 2022-12-09: 1000 mg via ORAL
  Filled 2022-12-09: qty 2

## 2022-12-09 MED ORDER — ACETAMINOPHEN 325 MG PO TABS
650.0000 mg | ORAL_TABLET | Freq: Once | ORAL | Status: DC
  Filled 2022-12-09: qty 2

## 2022-12-09 MED ORDER — ARIPIPRAZOLE 5 MG PO TABS
7.5000 mg | ORAL_TABLET | Freq: Every day | ORAL | Status: DC
  Administered 2022-12-10 – 2022-12-11 (×2): 7.5 mg via ORAL
  Filled 2022-12-09 (×2): qty 2

## 2022-12-09 MED ORDER — CARVEDILOL 6.25 MG PO TABS
6.2500 mg | ORAL_TABLET | Freq: Two times a day (BID) | ORAL | Status: DC
  Administered 2022-12-10: 6.25 mg via ORAL
  Filled 2022-12-09 (×2): qty 1

## 2022-12-09 MED ORDER — HYDROXYZINE HCL 25 MG PO TABS
25.0000 mg | ORAL_TABLET | Freq: Once | ORAL | Status: AC
  Administered 2022-12-09: 25 mg via ORAL
  Filled 2022-12-09: qty 1

## 2022-12-09 NOTE — ED Notes (Signed)
PT returned from Dialysis and is anxious to go to [facility]

## 2022-12-09 NOTE — Progress Notes (Signed)
Pt ended tx 1hr 11 mins early due to having to use rest room refused bedpan tx d/cd pt yelling and refused to wait for needles to be pulled got out of bed and walked to staff bathroom despite being educated on risks, sitter at side

## 2022-12-09 NOTE — ED Notes (Signed)
Pt resting with eyes closed. Sitters at bedside.

## 2022-12-09 NOTE — ED Notes (Signed)
Pt moved from bed H-1 to room 48.

## 2022-12-09 NOTE — Tx Team (Signed)
Initial Treatment Plan 12/09/2022 10:18 PM Natasha Long [id number]    PATIENT STRESSORS: Health problems   Marital or family conflict     PATIENT STRENGTHS: Ability for insight  Motivation for treatment/growth    PATIENT IDENTIFIED PROBLEMS: Depression  Suicidal Ideation  Anxiety                 DISCHARGE CRITERIA:  Adequate post-discharge living arrangements Verbal commitment to aftercare and medication compliance  PRELIMINARY DISCHARGE PLAN: Outpatient therapy Placement in alternative living arrangements  PATIENT/FAMILY INVOLVEMENT: This treatment plan has been presented to and reviewed with the patient, Natasha Long.  The patient has been given the opportunity to ask questions and make suggestions.  Mallie Darting, RN 12/09/2022, 10:18 PM

## 2022-12-09 NOTE — ED Notes (Signed)
Pt declined Tylenol and states it doesn't work.

## 2022-12-09 NOTE — Progress Notes (Deleted)
Established Patient Office Visit  Subjective   Patient ID: Natasha Long, female    DOB: 12-15-1995  Age: 27 y.o. MRN: [id number]  No chief complaint on file.   12/19/22  TOC visit Not seen since 07/2021 ESRD multiple admits     Admit date: 12/04/2022 Discharge date: 12/07/2022 30 Day Unplanned Readmission Risk Score    Flowsheet Row ED to Hosp-Admission (Current) from 12/04/2022 in [facility] 30 Day Unplanned Readmission Risk Score (%) 84.04 Filed at 12/07/2022 0801         This score is the patient's risk of an unplanned readmission within 30 days of being discharged (0 -100%). The score is based on dignosis, age, lab data, medications, orders, and past utilization.   Low:  0-14.9   Medium: 15-21.9   High: 22-29.9   Extreme: 30 and above               Admitted From: Home Disposition: Home   Recommendations for Outpatient Follow-up:  1. Follow up with PCP in 1-2 weeks 2. Please obtain BMP/CBC in one week 3. Please follow up with your PCP on the following pending results: Unresulted Labs (From admission, onward)        Start     Ordered   12/05/22 1012    Hepatitis B surface antibody,quantitative  (New Admission Hemo Labs (Hepatitis B))  Once,   R       12/05/22 1013   12/04/22 1427    Urine rapid drug screen (hosp performed)  Once,   STAT       12/04/22 1426   12/04/22 1118    Urinalysis, Routine w reflex microscopic  Once,   URGENT       12/04/22 1117   Home Health: None Equipment/Devices: None   Discharge Condition: Stable CODE STATUS: Full code Diet recommendation: Renal   Subjective: Seen and examined, other than generalized body ache which is chronic finding for her, she has no other complaint.  She remains sleepy, that is her baseline.   Brief/Interim Summary: Allyn Balogun is a 27 y.o. female with medical history significant of ESR D dialysis dependent, CHF, chronic pericardial effusion, hypertension, PTSD  and borderline personality disorder who presented to the hospital with chief complaint of weakness and dizziness.  She has a long history of noncompliance and this is her 14th hospitalization in last 12 months.  She missed previous 2 sessions of dialysis. BMP showed hyperkalemia with potassium 7.0, BUN >130, creatinine 16.1.  CT head unremarkable.  Nephrology consulted for dialysis, patient admitted under hospitalist service.  She received Lokelma, started on dialysis, hyperkalemia improved.  She is on Monday Wednesday Friday schedule.  She is getting her dialysis today and she will be due on Wednesday again.  Patient has longstanding history of noncompliance, I have spent great length of time with her explaining and counseling about the importance of continuing the dialysis and avoiding hospitalizations, she does not seem to be motivated to follow the recommendations.  For that reason, she remains at high risk of rehospitalization.   Acute metabolic encephalopathy -In setting of uremia -CT head result unremarkable, patient stands to be sleepy almost every time and avoids having any conversation with the physicians and pretends to be lethargic but she is fully alert and awake whenever it is mealtime and she consumes all of her meals.   HTN -Blood pressure very well-controlled, continue Coreg, hydralazine and Imdur.  Unsure whether she has home medications or  not, she states she does not.  I have prescribed all these medications again to her [place].   chronic diastolic CHF -Echo [id number] showed EF 55-60% -Torsemide, imdur  -Volume management with dialysis   PTSD Borderline personality disorder -Abilify   Pericardial effusion: Patient appears to have chronic uremic pericardial fusion with no clinical signs of acute pericarditis or tamponade.  Echo was done in December 2023.  LV function is normal.  During recent hospitalization when she was discharged on 11/11/2022, she was seen by cardiology  and cardiothoracic surgery and they all opinionated that patient does not need pericardial drainage and recommended continuing fluid management by hemodialysis but unfortunately patient continues to remain noncompliant.   Anemia of chronic disease: Stable.   Discharge plan was discussed with patient and/or family member and they verbalized understanding and agreed with it.  Discharge Diagnoses:  Principal Problem:   Hyperkalemia Active Problems:   Acute metabolic encephalopathy   HTN (hypertension)   Acute on chronic diastolic CHF (congestive heart failure) (HCC)   Borderline personality disorder (HCC)   Mood disorder (HCC)   PTSD (post-traumatic stress disorder)   Dialysis patient, noncompliant   Uremia   Volume overload   ESRD on hemodialysis (HCC)       {History (Optional):23778}  ROS    Objective:     LMP  (LMP Unknown)  {Vitals History (Optional):23777}  Physical Exam   Results for orders placed or performed during the hospital encounter of 12/08/22  Comprehensive metabolic panel  Result Value Ref Range   Sodium 136 135 - 145 mmol/L   Potassium 3.9 3.5 - 5.1 mmol/L   Chloride 96 (L) 98 - 111 mmol/L   CO2 26 22 - 32 mmol/L   Glucose, Bld 111 (H) 70 - 99 mg/dL   BUN 73 (H) 6 - 20 mg/dL   Creatinine, Ser 6.08 (H) 0.44 - 1.00 mg/dL   Calcium 9.0 8.9 - 10.3 mg/dL   Total Protein 6.6 6.5 - 8.1 g/dL   Albumin 2.5 (L) 3.5 - 5.0 g/dL   AST 43 (H) 15 - 41 U/L   ALT 32 0 - 44 U/L   Alkaline Phosphatase 107 38 - 126 U/L   Total Bilirubin 0.6 0.3 - 1.2 mg/dL   GFR, Estimated 9 (L) >60 mL/min   Anion gap 14 5 - 15  Ethanol  Result Value Ref Range   Alcohol, Ethyl (B) <10 <10 mg/dL  CBC with Diff  Result Value Ref Range   WBC 10.8 (H) 4.0 - 10.5 K/uL   RBC 2.57 (L) 3.87 - 5.11 MIL/uL   Hemoglobin 7.5 (L) 12.0 - 15.0 g/dL   HCT 25.2 (L) 36.0 - 46.0 %   MCV 98.1 80.0 - 100.0 fL   MCH 29.2 26.0 - 34.0 pg   MCHC 29.8 (L) 30.0 - 36.0 g/dL   RDW 20.5 (H) 11.5 -  15.5 %   Platelets 142 (L) 150 - 400 K/uL   nRBC 0.0 0.0 - 0.2 %   Neutrophils Relative % 82 %   Neutro Abs 8.8 (H) 1.7 - 7.7 K/uL   Lymphocytes Relative 11 %   Lymphs Abs 1.1 0.7 - 4.0 K/uL   Monocytes Relative 6 %   Monocytes Absolute 0.7 0.1 - 1.0 K/uL   Eosinophils Relative 1 %   Eosinophils Absolute 0.1 0.0 - 0.5 K/uL   Basophils Relative 0 %   Basophils Absolute 0.0 0.0 - 0.1 K/uL   Immature Granulocytes 0 %   Abs  Immature Granulocytes 0.04 0.00 - 0.07 K/uL  I-Stat beta hCG blood, ED  Result Value Ref Range   I-stat hCG, quantitative <5.0 <5 mIU/mL   Comment 3            {Labs (Optional):23779}  The ASCVD Risk score (Arnett DK, et al., 2019) failed to calculate for the following reasons:   The 2019 ASCVD risk score is only valid for ages 73 to 33    Assessment & Plan:   Problem List Items Addressed This Visit   None   No follow-ups on file.    Asencion Noble, MD
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
## 2022-12-09 NOTE — ED Notes (Signed)
Pt resting at this time, denies having pain. [place] provided to the pt.

## 2022-12-09 NOTE — ED Notes (Signed)
PT to dialysis at this time.

## 2022-12-09 NOTE — ED Provider Notes (Signed)
Emergency Medicine Observation Re-evaluation Note  Amarrah Berkery is a 27 y.o. female, seen on rounds today.  Pt initially presented to the ED for complaints of Suicidal Currently, the patient is resting without acute distress.  Physical Exam  BP (!) 128/30 (BP Location: Right Arm)   Pulse 100   Temp 98.4 F (36.9 C) (Oral)   Resp 20   Ht 5\' 8"  (1.727 m)   Wt 84.2 kg   LMP  (LMP Unknown)   SpO2 98%   BMI 28.22 kg/m  Physical Exam General: Resting without acute agitation Cardiac: No murmur on my exam Lungs: Clear bilaterally Psych: No acute agitation  ED Course / MDM  EKG:   I have reviewed the labs performed to date as well as medications administered while in observation.  Recent changes in the last 24 hours include none reported to me.  Plan  Current plan is for awaiting psychiatric evaluation and recommendations.    Dia Donate, Gwenyth Allegra, MD 12/09/22 [phone]

## 2022-12-09 NOTE — Progress Notes (Signed)
   12/09/22 1730  Vitals  Temp 98.9 F (37.2 C)  Pulse Rate (!) 123  Resp (!) 35  BP (!) 140/89  SpO2 100 %  O2 Device Room Air  Weight 88.8 kg  Type of Weight Post-Dialysis  Oxygen Therapy  Patient Activity (if Appropriate) In bed  Post Treatment  Dialyzer Clearance Lightly streaked  Duration of HD Treatment -hour(s) 1.5 hour(s)  Hemodialysis Intake (mL) 0 mL  Liters Processed 43.8  Fluid Removed (mL) 1700 mL  Tolerated HD Treatment Yes  Post-Hemodialysis Comments pt ama tx due to having to use the restroom  AVG/AVF Arterial Site Held (minutes) 10 minutes  AVG/AVF Venous Site Held (minutes) 10 minutes   Received patient in bed to unit.  Alert and oriented.  Informed consent signed and in chart.   Treatment initiated: 1542 Treatment completed: 1730  Patient tolerated well.  Transported back to the room  Alert, without acute distress.  Hand-off given to patient's nurse.   Access used: LUA AVF Access issues: none  Total UF removed: 1.7L Medication(s) given: none Ended tx 1hr 40mins early for restroom PA notified   [facility]

## 2022-12-09 NOTE — Progress Notes (Signed)
Subjective: Recently discharged 12/07/22 now readmitted with suicidal ideations.  Last HD 01/08 on schedule.  Seen in the hall in the ER.  Currently asking if she can go to dialysis today currently no suicidal ideations for transfer to [facility] behavioral health unit after HD.  Objective Vital signs in last 24 hours: Vitals:   12/08/22 1349 12/08/22 1653 12/09/22 0303 12/09/22 1130  BP: (!) 137/99 (!) 145/96 (!) 128/30 (!) 143/99  Pulse: (!) 113 (!) 112 100 (!) 108  Resp: 16 19 20 18   Temp:  98.1 F (36.7 C) 98.4 F (36.9 C) (!) 97.4 F (36.3 C)  TempSrc:  Oral Oral Oral  SpO2: 99% 100% 98% 97%  Weight:      Height:       Weight change:   Physical Exam: General: Alert, Young adult female NAD, appears comfortable and appropriate currently Heart: RRR, No mrg Lungs: CTA  bilat. Nonlabored  Abdomen: NABS, soft, NT, ND  Extremities: trace bilat  pedal edema Dialysis Access: + Bruit LUA AVF    Dialysis Orders:  GOC MWF 4:00 400/800 EDW 82.9kg 2K/2Ca AVF Heparin 5000 Last OP dialysis 11/13/22  -Mircera 200 q 2 wks  -Calcitriol 0.5 TIW Calcium acetate    Problem/Plan: ESRD -HD schedule MWF Labs 0109/24 K3.9 BUN 73 CR 6.08 CO2 26.  Recent admit with uremia secondary to missing dialysis with history of noncompliance.  Plan for HD today patient agrees Suicidal ideations-currently.  Stable for admission to [facility] behavioral health Anemia -Hgb 7.5, needing ESA, never given as ordered last admit subcu Aranesp 200 today Secondary hyperparathyroidism - has phosphorus on January 8 =5.0, calcium corrected slightly high; will hold calcitriol continue binder with meals  HTN/volume -BP labile in ER.  UF with HD today and continue home meds Coreg, hydralazine and Imdur PTSD/borderline personality disorder-as above plans for her to [facility] for behavioral health/psych treatment   Ho Pericardial fusion= no rub heard this a.m. appear sto be chronic from uremic pericardial effusion secondary to noncompliance  with HD as noted by admit team last discharge 11/11/22 seen by cardiology and cardiac surgery all opinionated patient did not need pericardial drainage continue management with HD Ernest Haber, PA-C [facility] Beeper [phone] 12/09/2022,2:46 PM  LOS: 0 days   Labs: Basic Metabolic Panel: Recent Labs  Lab 12/05/22 0305 12/06/22 0736 12/07/22 0811 12/08/22 1200  NA 136  137 134* 134* 136  K 3.9  3.9 3.6 3.8 3.9  CL 92*  97* 96* 95* 96*  CO2 22  24 25 24 26   GLUCOSE 96  101* 102* 99 111*  BUN 78*  77* 65* 89* 73*  CREATININE 8.10*  8.06* 6.71* 7.89* 6.08*  CALCIUM 8.0*  8.1* 8.0* 8.8* 9.0  PHOS 6.9*  --  5.0*  --    Liver Function Tests: Recent Labs  Lab 12/04/22 1126 12/05/22 0305 12/07/22 0811 12/08/22 1200  AST 74*  --   --  43*  ALT 46*  --   --  32  ALKPHOS 115  --   --  107  BILITOT 1.2  --   --  0.6  PROT 7.6  --   --  6.6  ALBUMIN 3.1* 2.6* 2.2* 2.5*   No results for input(s): "LIPASE", "AMYLASE" in the last 168 hours. No results for input(s): "AMMONIA" in the last 168 hours. CBC: Recent Labs  Lab 12/04/22 1126 12/04/22 1524 12/04/22 2122 12/05/22 0305 12/07/22 0811 12/08/22 1200  WBC 13.5*  --  10.5 8.8  10.2 10.8*  NEUTROABS 11.8*  --   --   --   --  8.8*  HGB 8.7*   < > 9.1* 8.0* 8.1* 7.5*  HCT 28.9*   < > 28.8* 25.2* 26.1* 25.2*  MCV 97.0  --  92.9 90.0 95.3 98.1  PLT 187  --  158 163 133* 142*   < > = values in this interval not displayed.   Cardiac Enzymes: No results for input(s): "CKTOTAL", "CKMB", "CKMBINDEX", "TROPONINI" in the last 168 hours. CBG: Recent Labs  Lab 12/04/22 1153  GLUCAP 98    Studies/Results: No results found. Medications:   acetaminophen  650 mg Oral Once   heparin sodium (porcine)

## 2022-12-09 NOTE — Progress Notes (Signed)
Patient admitted from [facility], ED. Report received from [place], Therapist, sports. Pt calm and cooperative during assessment. Pt endorses passive SI with no plan, verbally contracts for safety. Pt denies HI/AVH. Pt endorses anxiety and depression. Pt states that she is homeless, on dialysis, and has no support here. She stated she just doesn't want to wake up any more. Pt given education, support, and encouragement to be active in her treatment plan. Pt being monitored Q 15 minutes for safety per unit protocol. Pt remains safe on the unit

## 2022-12-09 NOTE — ED Notes (Signed)
TC to [facility] and was requested to have report called after 1900.

## 2022-12-09 NOTE — ED Notes (Signed)
Pt transported to Dialysis. Sitter with PT

## 2022-12-09 NOTE — BH Assessment (Signed)
Patient is to be admitted to [facility] BMU today 12/09/22 by Dr. Weber Cooks.  Attending Physician will be Dr.  Weber Cooks .   Patient has been assigned to room 325, by [place].    Call for report: [phone]  ER staff is aware of the admission:  Elberta Fortis, Patient Access.

## 2022-12-09 NOTE — ED Notes (Signed)
PT waiting for dialysis

## 2022-12-09 NOTE — ED Notes (Signed)
Pt states she is depressed and if she is discharged she will cut herself.

## 2022-12-09 NOTE — ED Notes (Signed)
Voluntary status, paper and clipboard now located in purple zone.

## 2022-12-10 DIAGNOSIS — F332 Major depressive disorder, recurrent severe without psychotic features: Secondary | ICD-10-CM

## 2022-12-10 MED ORDER — PENTAFLUOROPROP-TETRAFLUOROETH EX AERO
1.0000 | INHALATION_SPRAY | CUTANEOUS | Status: DC | PRN
  Filled 2022-12-10: qty 30

## 2022-12-10 MED ORDER — LOPERAMIDE HCL 2 MG PO CAPS
2.0000 mg | ORAL_CAPSULE | ORAL | Status: DC | PRN
  Administered 2022-12-10 – 2022-12-11 (×3): 2 mg via ORAL
  Filled 2022-12-10 (×3): qty 1

## 2022-12-10 MED ORDER — LIDOCAINE HCL (PF) 1 % IJ SOLN: 5.0000 mL | INTRAMUSCULAR | Status: DC | PRN

## 2022-12-10 MED ORDER — CHLORHEXIDINE GLUCONATE CLOTH 2 % EX PADS: 6.0000 | MEDICATED_PAD | Freq: Every day | CUTANEOUS | Status: DC

## 2022-12-10 MED ORDER — HEPARIN SODIUM (PORCINE) 1000 UNIT/ML DIALYSIS: 1000.0000 [IU] | INTRAMUSCULAR | Status: DC | PRN

## 2022-12-10 MED ORDER — ANTICOAGULANT SODIUM CITRATE 4% (200MG/5ML) IV SOLN: 5.0000 mL | Status: DC | PRN

## 2022-12-10 MED ORDER — CARVEDILOL 12.5 MG PO TABS
12.5000 mg | ORAL_TABLET | Freq: Two times a day (BID) | ORAL | Status: DC
  Administered 2022-12-11: 12.5 mg via ORAL
  Filled 2022-12-10 (×2): qty 1

## 2022-12-10 MED ORDER — LIDOCAINE-PRILOCAINE 2.5-2.5 % EX CREA: 1.0000 | TOPICAL_CREAM | CUTANEOUS | Status: DC | PRN

## 2022-12-10 MED ORDER — ALTEPLASE 2 MG IJ SOLR: 2.0000 mg | Freq: Once | INTRAMUSCULAR | Status: DC | PRN

## 2022-12-10 NOTE — BHH Suicide Risk Assessment (Signed)
[facility] Admission Suicide Risk Assessment   Nursing information obtained from:  Patient Demographic factors:  Low socioeconomic status, Unemployed Current Mental Status:  Self-harm thoughts Loss Factors:  Decline in physical health, Financial problems / change in socioeconomic status Historical Factors:  Impulsivity Risk Reduction Factors:  Positive therapeutic relationship  Total Time spent with patient: 45 minutes Principal Problem: Severe recurrent major depression without psychotic features ([place]) Diagnosis:  Principal Problem:   Severe recurrent major depression without psychotic features ([place]) Active Problems:   PTSD (post-traumatic stress disorder)   HTN (hypertension)   Borderline personality disorder (HCC)   Moderate cannabis use disorder (HCC)   Cocaine use, 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[place])   Suicidal ideation   ESRD (end stage renal disease) on dialysis ([place])   Homelessness  Subjective Data: Patient seen and chart reviewed.  27 year old woman with a history of longstanding depression and personality disorder PTSD substance abuse.  Presented to [facility] with suicidal ideation and transferred to [place].  Continues to endorse suicidal thoughts.  Denies active psychosis.  Complaining of severe pain in lower extremities and back.  Patient has a history of suicide attempts.  No acute intention but still talks about wishing she were dead.  Continued Clinical Symptoms:  Alcohol Use Disorder Identification Test Final Score (AUDIT): 0 The "Alcohol Use Disorders Identification Test", Guidelines for Use in Primary Care, Second Edition.  World Pharmacologist [facility]). Score between 0-7:  no or low risk or alcohol related problems. Score between 8-15:  moderate risk of alcohol related problems. Score between 16-19:  high risk of alcohol related problems. Score 20 or above:  warrants further diagnostic evaluation for alcohol dependence and treatment.   CLINICAL FACTORS:    Depression:   Comorbid alcohol abuse/dependence Alcohol/Substance Abuse/Dependencies   Musculoskeletal: Strength & Muscle Tone: within normal limits Gait & Station: normal Patient leans: N/A  Psychiatric Specialty Exam:  Presentation  General Appearance:  Casual  Eye Contact: Fair  Speech: Clear and Coherent  Speech Volume: Normal  Handedness: Right   Mood and Affect  Mood: Depressed; Anxious  Affect: Congruent; Depressed   Thought Process  Thought Processes: Coherent  Descriptions of Associations:Intact  Orientation:Full (Time, Place and Person)  Thought Content:Illogical  History of Schizophrenia/Schizoaffective disorder:No  Duration of Psychotic Symptoms:No data recorded Hallucinations:Hallucinations: None  Ideas of Reference:None  Suicidal Thoughts:Suicidal Thoughts: Yes, Active SI Active Intent and/or Plan: With Intent; With Plan; With Means to [place]; With Access to Means  Homicidal Thoughts:Homicidal Thoughts: No   Sensorium  Memory: Immediate Good; Recent Good; Remote Good  Judgment: -- (impulsive)  Insight: Lacking   Executive Functions  Concentration: Fair  Attention Span: Fair  Recall: [place] of Knowledge: Good  Language: Good   Psychomotor Activity  Psychomotor Activity: Psychomotor Activity: Normal   Assets  Assets: Communication Skills; Desire for Improvement; Financial Resources/Insurance   Sleep  Sleep: Sleep: Fair Number of Hours of Sleep: 5    Physical Exam: Physical Exam Vitals and nursing note reviewed.  Constitutional:      Appearance: Normal appearance.  HENT:     Head: Normocephalic and atraumatic.     Mouth/Throat:     Pharynx: Oropharynx is clear.  Eyes:     Pupils: Pupils are equal, round, and reactive to light.  Cardiovascular:     Rate and Rhythm: Normal rate and regular rhythm.  Pulmonary:     Effort: Pulmonary effort is normal.     Breath sounds: Normal breath  sounds.  Abdominal:  General: Abdomen is flat.     Palpations: Abdomen is soft.  Musculoskeletal:        General: Normal range of motion.  Skin:    General: Skin is warm and dry.  Neurological:     General: No focal deficit present.     Mental Status: She is alert. Mental status is at baseline.  Psychiatric:        Attention and Perception: She is inattentive.        Mood and Affect: Mood normal. Affect is blunt.        Speech: Speech is delayed.        Behavior: Behavior is slowed.        Thought Content: Thought content includes suicidal ideation. Thought content does not include suicidal plan.        Cognition and Memory: Cognition is impaired. Memory is impaired.        Judgment: Judgment is impulsive.    Review of Systems  Constitutional: Negative.   HENT: Negative.    Eyes: Negative.   Respiratory: Negative.    Cardiovascular: Negative.   Gastrointestinal: Negative.   Musculoskeletal: Negative.   Skin: Negative.   Neurological:  Positive for sensory change.  Psychiatric/Behavioral:  Positive for depression, substance abuse and suicidal ideas. Negative for hallucinations. The patient is nervous/anxious.    Blood pressure (!) 148/103, pulse (!) 123, temperature 98.8 F (37.1 C), temperature source Oral, resp. rate 16, height 5\' 8"  (1.727 m), weight 88.8 kg, SpO2 99 %. Body mass index is 29.77 kg/m.   COGNITIVE FEATURES THAT CONTRIBUTE TO RISK:  Polarized thinking and Thought constriction (tunnel vision)    SUICIDE RISK:   Mild:  Suicidal ideation of limited frequency, intensity, duration, and specificity.  There are no identifiable plans, no associated intent, mild dysphoria and related symptoms, good self-control (both objective and subjective assessment), few other risk factors, and identifiable protective factors, including available and accessible social support.  PLAN OF CARE: Continue 15-minute checks.  Manage medication as previously.  Engage in individual  and group therapy when possible.  Try and work on stabilizing medical conditions and possibly finding a discharge plan  I certify that inpatient services furnished can reasonably be expected to improve the patient's condition.   Alethia Berthold, MD 12/10/2022, 5:14 PM
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
## 2022-12-10 NOTE — [facility] Counselor (Signed)
CSW attempted to complete assessment with the patient. Patient was asleep and CSW did not wake.  CSW will attempt again later.  Assunta Curtis, MSW, LCSW 12/10/2022 10:51 AM

## 2022-12-10 NOTE — [facility] Group Notes (Signed)
[facility] Group Notes:  (Nursing/MHT/Case Management/Adjunct)  Date:  12/10/2022  Time:  3:15 PM  Type of Therapy:  Psychoeducational Skills  Participation Level:  Did Not Attend   Adela Lank [facility] 12/10/2022, 3:15 PM

## 2022-12-10 NOTE — Plan of Care (Signed)
  Problem: Activity: Goal: Risk for activity intolerance will decrease Outcome: Not Progressing   Problem: Coping: Goal: Level of anxiety will decrease Outcome: Not Progressing   Problem: Pain Managment: Goal: General experience of comfort will improve Outcome: Not Progressing   

## 2022-12-10 NOTE — Progress Notes (Signed)
Recreation Therapy Notes    Date: 12/10/2022  Time: 10:50 am  Location: Craft room     Behavioral response: N/A   Intervention Topic: Self-care   Discussion/Intervention: Patient refused to attend group.   Clinical Observations/Feedback:  Patient refused to attend group.    Sneijder Bernards LRT/CTRS         Daelyn Mozer 12/10/2022 12:38 PM

## 2022-12-10 NOTE — Group Note (Signed)
LCSW Group Therapy Note  Group Date: 12/10/2022 Start Time: 1300 End Time: 1400   Type of Therapy and Topic:  Group Therapy - Healthy vs Unhealthy Coping Skills  Participation Level:  Did Not Attend   Description of Group The focus of this group was to determine what unhealthy coping techniques typically are used by group members and what healthy coping techniques would be helpful in coping with various problems. Patients were guided in becoming aware of the differences between healthy and unhealthy coping techniques. Patients were asked to identify 2-3 healthy coping skills they would like to learn to use more effectively.  Therapeutic Goals Patients learned that coping is what human beings do all day long to deal with various situations in their lives Patients defined and discussed healthy vs unhealthy coping techniques Patients identified their preferred coping techniques and identified whether these were healthy or unhealthy Patients determined 2-3 healthy coping skills they would like to become more familiar with and use more often. Patients provided support and ideas to each other   Summary of Patient Progress:   Patient did not attend group despite encouraged participation.     Therapeutic Modalities Cognitive Behavioral Therapy Motivational Interviewing  Larose Kells 12/10/2022  2:24 PM

## 2022-12-10 NOTE — H&P (Signed)
Psychiatric Admission Assessment Adult  Patient Identification: Zenola Dezarn MRN:  [id number] Date of Evaluation:  12/10/2022 Chief Complaint:  Severe recurrent major depression without psychotic features ([place]) [F33.2] Principal Diagnosis: Severe recurrent major depression without psychotic features ([place]) Diagnosis:  Principal Problem:   Severe recurrent major depression without psychotic features ([place]) Active Problems:   PTSD (post-traumatic stress disorder)   HTN (hypertension)   Borderline personality disorder (HCC)   Moderate cannabis use disorder (HCC)   Cocaine use, 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[place])   Suicidal ideation   ESRD (end stage renal disease) on dialysis ([place])   Homelessness  History of Present Illness: Patient seen and chart reviewed.  27 year old woman with a history of chronic mood symptoms as well as multiple medical issues.  Transferred to [place] from [place] where she had been talking about having suicidal ideation.  Patient's chief complaint to me is pain.  States that she has pain in her feet and lower extremities also pain in her back and hips.  Pain in the lower extremities she says has gotten worse over the last month.  Pain in the hips chronic.  Patient endorses depressed mood and hopelessness.  Suicidal ideation without any actual intent or plan.  Denies any current hallucinations.  Patient states that she has been sleeping on the streets recently.  It looks like that is relatively recent since she was in jail for quite a bit of last year.  Now apparently she is no longer in jail and has no place to stay.  Patient is currently being given Abilify.  She is unclear if she was on any medication while she was in jail although whenever she would come to the hospital they would restart the Abilify.  She says she has been using cocaine again recently.  No drug screen was obtained.  Denies other drugs or alcohol use.  Patient is complaining of pain and  anxiety difficulty sleeping. Associated Signs/Symptoms: Depression Symptoms:  depressed mood, anhedonia, insomnia, fatigue, difficulty concentrating, hopelessness, suicidal thoughts without plan, anxiety, disturbed sleep, (Hypo) Manic Symptoms:  Impulsivity, Irritable Mood, Anxiety Symptoms:  Excessive Worry, Psychotic Symptoms:   Denies any PTSD Symptoms: Chronic history of abuse and trauma.  Irritability and avoidance. Total Time spent with patient: 45 minutes  Past Psychiatric History: Multiple psychiatric hospitalizations.  Patient reports that she has tried to kill herself at least 3 times in the past.  She has been on multiple medications but thinks Abilify has been the only 1 that had been effective.  Previous diagnoses of depression and PTSD and substance abuse.  Ongoing cocaine abuse.  Is the patient at risk to self? Yes.    Has the patient been a risk to self in the past 6 months? Yes.    Has the patient been a risk to self within the distant past? Yes.    Is the patient a risk to others? No.  Has the patient been a risk to others in the past 6 months? No.  Has the patient been a risk to others within the distant past? No.   [place] Scale:  Flowsheet Row Admission (Current) from 12/09/2022 in [facility] ED from 12/08/2022 in [facility] ED to Hosp-Admission (Discharged) from 12/04/2022 in [facility] CATEGORY Low Risk High Risk No Risk        Prior Inpatient Therapy: Yes.   If yes, describe multiple previous psychiatric hospitalizations at many  facilities Prior Outpatient Therapy: Yes.   If yes, describe recently only in jail.  Seems to have difficulty with outpatient follow-up as she is chronically homeless  Alcohol Screening: 1. How often do you have a drink containing alcohol?: Never 2. How many drinks containing alcohol do you have on a typical day when you  are drinking?: 1 or 2 3. How often do you have six or more drinks on one occasion?: Never AUDIT-C Score: 0 4. How often during the last year have you found that you were not able to stop drinking once you had started?: Never 5. How often during the last year have you failed to do what was normally expected from you because of drinking?: Never 6. How often during the last year have you needed a first drink in the morning to get yourself going after a heavy drinking session?: Never 7. How often during the last year have you had a feeling of guilt of remorse after drinking?: Never 8. How often during the last year have you been unable to remember what happened the night before because you had been drinking?: Never 9. Have you or someone else been injured as a result of your drinking?: No 10. Has a relative or friend or a doctor or another health worker been concerned about your drinking or suggested you cut down?: No Alcohol Use Disorder Identification Test Final Score (AUDIT): 0 Substance Abuse History in the last 12 months:  Yes.   Consequences of Substance Abuse: Cocaine abuse driving economic problems legal problems mood instability. Previous Psychotropic Medications: Yes  Psychological Evaluations: Yes  Past Medical History:  Past Medical History:  Diagnosis Date   Asthma    as a child, no problem as an adult, no inhaler   CHF (congestive heart failure) (HCC)    Complication of anesthesia    woke up before tube removed, 1 time fought nurses   ESRD on hemodialysis [facility])    M-W-F   History of borderline personality disorder    Hypertension    diagnosed as child; stopped meds at 26 yo   Insomnia    Neuromuscular disorder ([place])    peripheral neuropathy   Nonspecific chest pain    PTSD (post-traumatic stress disorder)     Past Surgical History:  Procedure Laterality Date   AV FISTULA PLACEMENT Left 10/18/2020   Procedure: LEFT ARM ARTERIOVENOUS (AV) FISTULA CREATION;  Surgeon:  Serafina Mitchell, MD;  Location: [facility] OR;  Service: Vascular;  Laterality: Left;   CHOLECYSTECTOMY     extraction of wisdom teeth     FISTULA SUPERFICIALIZATION Left 02/13/2021   Procedure: LEFT BRACHIOCEPHALIC ARTERIOVENOUS FISTULA SUPERFICIALIZATION;  Surgeon: Serafina Mitchell, MD;  Location: [place];  Service: Vascular;  Laterality: Left;   I & D EXTREMITY Left 01/30/2022   Procedure: IRRIGATION AND DEBRIDEMENT EXTREMITY;  Surgeon: Milly Jakob, MD;  Location: [place];  Service: Orthopedics;  Laterality: Left;   INCISION AND DRAINAGE OF WOUND Left 01/30/2022   Procedure: LEFT WRIST ASPIRATION;  Surgeon: Vanetta Mulders, MD;  Location: [place];  Service: Orthopedics;  Laterality: Left;   KNEE ARTHROSCOPY Right 01/30/2022   Procedure: ARTHROSCOPY KNEE AND IRRIIGATION AND DEBRIDMENT; LEFT WRIST ASPIRATION;  Surgeon: Vanetta Mulders, MD;  Location: [name];  Service: Orthopedics;  Laterality: Right;   RENAL BIOPSY     x 2   TUNNELED VENOUS CATHETER PLACEMENT  02/11/2021   [facility]   Family History:  Family History  Adopted: Yes  Problem Relation  Age of Onset   Diabetes Other    Hypertension Other    Family Psychiatric  History: None reported Tobacco Screening:  Social History   Tobacco Use  Smoking Status Every Day   Packs/day: 0.50   Years: 10.00   Total pack years: 5.00   Types: Cigarettes  Smokeless Tobacco Never    BH Tobacco Counseling     Are you interested in Tobacco Cessation Medications?  No, patient refused Counseled patient on smoking cessation:  Refused/Declined practical counseling Reason Tobacco Screening Not Completed: No value filed.       Social History:  Social History   Substance and Sexual Activity  Alcohol Use Not Currently   Comment: "maybe 3 a month."- liquor     Social History   Substance and Sexual Activity  Drug Use Yes   Types: Marijuana    Additional Social History:                           Allergies:   Allergies   Allergen Reactions   Bupropion Anxiety, Other (See Comments) and Nausea And Vomiting    Caused panic attacks  Caused panic attacks    "Adverse effects"   Prozac [Fluoxetine Hcl] Anxiety and Other (See Comments)    Caused panic attacks. Dizziness    Prednisone Other (See Comments)    Pt stated this med caused pancreatitis. Dizziness   Lab Results:  Results for orders placed or performed during the hospital encounter of 12/08/22 (from the past 48 hour(s))  Resp panel by RT-PCR (RSV, Flu A&B, Covid) Anterior Nasal Swab     Status: None   Collection Time: 12/09/22 12:45 PM   Specimen: Anterior Nasal Swab  Result Value Ref Range   SARS Coronavirus 2 by RT PCR NEGATIVE NEGATIVE    Comment: (NOTE) SARS-CoV-2 target nucleic acids are NOT DETECTED.  The SARS-CoV-2 RNA is generally detectable in upper respiratory specimens during the acute phase of infection. The lowest concentration of SARS-CoV-2 viral copies this assay can detect is 138 copies/mL. A negative result does not preclude SARS-Cov-2 infection and should not be used as the sole basis for treatment or other patient management decisions. A negative result may occur with  improper specimen collection/handling, submission of specimen other than nasopharyngeal swab, presence of viral mutation(s) within the areas targeted by this assay, and inadequate number of viral copies(<138 copies/mL). A negative result must be combined with clinical observations, patient history, and epidemiological information. The expected result is Negative.  Fact Sheet for Patients:  EntrepreneurPulse.com.au  Fact Sheet for Healthcare Providers:  IncredibleEmployment.be  This test is no t yet approved or cleared by the [place] FDA and  has been authorized for detection and/or diagnosis of SARS-CoV-2 by FDA under an Emergency Use Authorization (EUA). This EUA will remain  in effect (meaning this test can be  used) for the duration of the COVID-19 declaration under Section 564(b)(1) of the Act, 21 U.S.C.section 360bbb-3(b)(1), unless the authorization is terminated  or revoked sooner.       Influenza A by PCR NEGATIVE NEGATIVE   Influenza B by PCR NEGATIVE NEGATIVE    Comment: (NOTE) The Xpert Xpress SARS-CoV-2/FLU/RSV plus assay is intended as an aid in the diagnosis of influenza from Nasopharyngeal swab specimens and should not be used as a sole basis for treatment. Nasal washings and aspirates are unacceptable for Xpert Xpress SARS-CoV-2/FLU/RSV testing.  Fact Sheet for Patients: EntrepreneurPulse.com.au  Fact Sheet for Healthcare  Providers: IncredibleEmployment.be  This test is not yet approved or cleared by the [place] and has been authorized for detection and/or diagnosis of SARS-CoV-2 by FDA under an Emergency Use Authorization (EUA). This EUA will remain in effect (meaning this test can be used) for the duration of the COVID-19 declaration under Section 564(b)(1) of the Act, 21 U.S.C. section 360bbb-3(b)(1), unless the authorization is terminated or revoked.     Resp Syncytial Virus by PCR NEGATIVE NEGATIVE    Comment: (NOTE) Fact Sheet for Patients: EntrepreneurPulse.com.au  Fact Sheet for Healthcare Providers: IncredibleEmployment.be  This test is not yet approved or cleared by the [place] FDA and has been authorized for detection and/or diagnosis of SARS-CoV-2 by FDA under an Emergency Use Authorization (EUA). This EUA will remain in effect (meaning this test can be used) for the duration of the COVID-19 declaration under Section 564(b)(1) of the Act, 21 U.S.C. section 360bbb-3(b)(1), unless the authorization is terminated or revoked.  Performed at [facility], [place] [address] [place], [place] [address]     Blood Alcohol level:  Lab Results  Component Value Date    ETH <10 12/08/2022   ETH <10 12/02/7251    Metabolic Disorder Labs:  Lab Results  Component Value Date   HGBA1C 5.4 12/24/2021   MPG 108 12/24/2021   MPG 105.41 01/16/2021   No results found for: "PROLACTIN" Lab Results  Component Value Date   CHOL 109 10/04/2022   TRIG 66 10/04/2022   HDL 54 10/04/2022   CHOLHDL 2.0 10/04/2022   VLDL 13 10/04/2022   LDLCALC 42 10/04/2022   LDLCALC 54 12/24/2021    Current Medications: Current Facility-Administered Medications  Medication Dose Route Frequency Provider Last Rate Last Admin   acetaminophen (TYLENOL) tablet 650 mg  650 mg Oral Q6H PRN Mia Winthrop T, MD   650 mg at 12/10/22 1118   alteplase (CATHFLO ACTIVASE) injection 2 mg  2 mg Intracatheter Once PRN Colon Flattery, NP       alum & mag hydroxide-simeth (MAALOX/MYLANTA) 200-200-20 MG/5ML suspension 30 mL  30 mL Oral Q4H PRN Nijae Doyel T, MD       anticoagulant sodium citrate solution 5 mL  5 mL Intracatheter PRN Colon Flattery, NP       ARIPiprazole (ABILIFY) tablet 7.5 mg  7.5 mg Oral Daily Amario Longmore T, MD   7.5 mg at 12/10/22 1117   calcitRIOL (ROCALTROL) capsule 0.25 mcg  0.25 mcg Oral Q T,Th,Sa-HD Kya Mayfield T, MD   0.25 mcg at 12/10/22 1118   calcium acetate (PHOSLO) capsule 667 mg  667 mg Oral TID WC Iolani Twilley T, MD   667 mg at 12/10/22 1118   [START ON 12/11/2022] carvedilol (COREG) tablet 12.5 mg  12.5 mg Oral BID WC Odelia Graciano, Madie Reno, MD       [START ON 12/11/2022] Chlorhexidine Gluconate Cloth 2 % PADS 6 each  6 each Topical Q0600 Colon Flattery, NP       [START ON 12/16/2022] Darbepoetin Alfa (ARANESP) injection 200 mcg  200 mcg Subcutaneous Q Wed-1800 Jazzlynn Rawe T, MD       heparin injection 1,000 Units  1,000 Units Intracatheter PRN Colon Flattery, NP       hydrALAZINE (APRESOLINE) tablet 50 mg  50 mg Oral Q8H Kamee Bobst T, MD   50 mg at 12/10/22 1450   isosorbide mononitrate (IMDUR) 24 hr tablet 60 mg  60 mg Oral Daily Darilyn Storbeck, Madie Reno, MD  lidocaine (PF) (XYLOCAINE) 1 % injection 5 mL  5 mL Intradermal PRN Colon Flattery, NP       lidocaine-prilocaine (EMLA) cream 1 Application  1 Application Topical PRN Colon Flattery, NP       loperamide (IMODIUM) capsule 2 mg  2 mg Oral PRN Tamu Golz T, MD   2 mg at 12/10/22 1324   magnesium hydroxide (MILK OF MAGNESIA) suspension 30 mL  30 mL Oral Daily PRN Aleck Locklin, Madie Reno, MD       pentafluoroprop-tetrafluoroeth (GEBAUERS) aerosol 1 Application  1 Application Topical PRN Colon Flattery, NP       sevelamer carbonate (RENVELA) tablet 1,600 mg  1,600 mg Oral TID WC Vaun Hyndman T, MD   1,600 mg at 12/10/22 1118   torsemide (DEMADEX) tablet 100 mg  100 mg Oral q morning Sherea Liptak, Madie Reno, MD       PTA Medications: Medications Prior to Admission  Medication Sig Dispense Refill Last Dose   ARIPiprazole (ABILIFY) 5 MG tablet Take 1.5 tablets (7.5 mg total) by mouth daily. 45 tablet 1    calcitRIOL (ROCALTROL) 0.25 MCG capsule Take 1 capsule (0.25 mcg total) by mouth Every Tuesday,Thursday,and Saturday with dialysis. 30 capsule 0    calcium acetate (PHOSLO) 667 MG capsule Take 1 capsule (667 mg total) by mouth 3 (three) times daily with meals. 90 capsule 3    carvedilol (COREG) 6.25 MG tablet Take 1 tablet (6.25 mg total) by mouth 2 (two) times daily with a meal. 60 tablet 0    hydrALAZINE (APRESOLINE) 50 MG tablet Take 1 tablet (50 mg total) by mouth every 8 (eight) hours. 90 tablet 0    isosorbide mononitrate (IMDUR) 60 MG 24 hr tablet Take 1 tablet (60 mg total) by mouth daily. 30 tablet 0    sevelamer carbonate (RENVELA) 800 MG tablet Take 2 tablets (1,600 mg total) by mouth 3 (three) times daily with meals. 180 tablet 0    torsemide (DEMADEX) 100 MG tablet Take 1 tablet (100 mg total) by mouth every morning. 30 tablet 3     Musculoskeletal: Strength & Muscle Tone: within normal limits Gait & Station: normal Patient leans: N/A            Psychiatric Specialty  Exam:  Presentation  General Appearance:  Casual  Eye Contact: Fair  Speech: Clear and Coherent  Speech Volume: Normal  Handedness: Right   Mood and Affect  Mood: Depressed; Anxious  Affect: Congruent; Depressed   Thought Process  Thought Processes: Coherent  Duration of Psychotic Symptoms: Longstanding chronic years Past Diagnosis of Schizophrenia or Psychoactive disorder: No  Descriptions of Associations:Intact  Orientation:Full (Time, Place and Person)  Thought Content:Illogical  Hallucinations:Hallucinations: None  Ideas of Reference:None  Suicidal Thoughts:Suicidal Thoughts: Yes, Active SI Active Intent and/or Plan: With Intent; With Plan; With Means to [place]; With Access to Means  Homicidal Thoughts:Homicidal Thoughts: No   Sensorium  Memory: Immediate Good; Recent Good; Remote Good  Judgment: -- (impulsive)  Insight: Lacking   Executive Functions  Concentration: Fair  Attention Span: Fair  Recall: [place] of Knowledge: Good  Language: Good   Psychomotor Activity  Psychomotor Activity: Psychomotor Activity: Normal   Assets  Assets: Communication Skills; Desire for Improvement; Financial Resources/Insurance   Sleep  Sleep: Sleep: Fair Number of Hours of Sleep: 5    Physical Exam: Physical Exam Vitals and nursing note reviewed.  Constitutional:      Appearance: Normal appearance.  HENT:  Head: Normocephalic and atraumatic.     Mouth/Throat:     Pharynx: Oropharynx is clear.  Eyes:     Pupils: Pupils are equal, round, and reactive to light.  Cardiovascular:     Rate and Rhythm: Normal rate and regular rhythm.  Pulmonary:     Effort: Pulmonary effort is normal.     Breath sounds: Normal breath sounds.  Abdominal:     General: Abdomen is flat.     Palpations: Abdomen is soft.  Musculoskeletal:        General: Normal range of motion.  Skin:    General: Skin is warm and dry.  Neurological:      General: No focal deficit present.     Mental Status: She is alert. Mental status is at baseline.  Psychiatric:        Attention and Perception: She is inattentive.        Mood and Affect: Mood is depressed. Affect is blunt.        Speech: Speech is delayed.        Behavior: Behavior is slowed and withdrawn.        Thought Content: Thought content includes suicidal ideation. Thought content does not include suicidal plan.        Cognition and Memory: Memory is impaired.        Judgment: Judgment is inappropriate.    Review of Systems  Constitutional: Negative.   HENT: Negative.    Eyes: Negative.   Respiratory: Negative.    Cardiovascular: Negative.   Gastrointestinal: Negative.   Musculoskeletal: Negative.   Skin: Negative.   Neurological:  Positive for sensory change.  Psychiatric/Behavioral:  Positive for depression, substance abuse and suicidal ideas. The patient is nervous/anxious and has insomnia.    Blood pressure (!) 148/103, pulse (!) 123, temperature 98.8 F (37.1 C), temperature source Oral, resp. rate 16, height 5\' 8"  (1.727 m), weight 88.8 kg, SpO2 99 %. Body mass index is 29.77 kg/m.  Treatment Plan Summary: Medication management and Plan medicines are reviewed with the patient.  Continuing the current dose of Abilify 7.5 mg.  Try and meet medical needs.  She is on the dialysis schedule and will be receiving dialysis tomorrow.  Very unclear where we will be able to ultimately discharge the patient.  Observation Level/Precautions:  15 minute checks  Laboratory:  Chemistry Profile  Psychotherapy:    Medications:    Consultations:    Discharge Concerns:    Estimated LOS:  Other:     Physician Treatment Plan for Primary Diagnosis: Severe recurrent major depression without psychotic features ([place]) Long Term Goal(s): Improvement in symptoms so as ready for discharge  Short Term Goals: Ability to verbalize feelings will improve, Ability to disclose and discuss  suicidal ideas, and Ability to demonstrate self-control will improve  Physician Treatment Plan for Secondary Diagnosis: Principal Problem:   Severe recurrent major depression without psychotic features ([place]) Active Problems:   PTSD (post-traumatic stress disorder)   HTN (hypertension)   Borderline personality disorder ([place])   Moderate cannabis use disorder ([place])   Cocaine use, 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[place])   Suicidal ideation   ESRD (end stage renal disease) on dialysis ([place])   Homelessness  Long Term Goal(s): Improvement in symptoms so as ready for discharge  Short Term Goals: Ability to identify and develop effective coping behaviors will improve and Ability to maintain clinical measurements within normal limits will improve  I certify that inpatient services furnished can reasonably be expected  to improve the patient's condition.    Alethia Berthold, MD 1/11/20245:17 PM

## 2022-12-10 NOTE — Progress Notes (Signed)
Pt denies SI/HI/AVH and verbally agrees to approach staff if these become apparent or before harming themselves/others. Rates depression 10/10. Rates anxiety 10/10. Rates pain 9/10. Pt has been in her room sleeping for most of the day. Pt is hard to wake up when sleeping. Pt stated that she feels horrible and is coming off of crack. Pt reports diarrhea, generalized body aches, anxiety, and nausea. Pt has been given anti-diarrheal.  Scheduled medications administered to pt, per MD orders. RN provided support and encouragement to pt. Q15 min safety checks implemented and continued. Pt safe on the unit. RN will continue to monitor and intervene as needed.  12/10/22 0940  Psych Admission Type (Psych Patients Only)  Admission Status Voluntary  Psychosocial Assessment  Patient Complaints Anxiety;Depression  Eye Contact Poor;Brief  Facial Expression Anxious;Worried;Sad  Affect Anxious;Depressed;Constricted  Speech Logical/coherent  Interaction Isolative;Childlike;Poor  Motor Activity Slow  Appearance/Hygiene Poor hygiene;In scrubs;Disheveled  Behavior Characteristics Cooperative;Anxious  Mood Depressed;Anxious  Aggressive Behavior  Effect No apparent injury  Thought Process  Coherency WDL  Content WDL  Delusions None reported or observed  Perception WDL  Hallucination None reported or observed  Judgment Limited  Confusion None  Danger to Self  Current suicidal ideation? Denies  Danger to Others  Danger to Others None reported or observed

## 2022-12-10 NOTE — Progress Notes (Signed)
Patient calm and pleasant during assessment denying SI/HI/AVH. Pt endorses anxiety and depression. Pt isolative to her room tonight, stating she just wanted to get some rest. Pt compliant with medication administration per MD orders. Pt given education, support, and encouragement to be active in her treatment plan. Pt being monitored Q 15 minutes for safety per unit protocol, remains safe on the unit

## 2022-12-11 ENCOUNTER — Encounter: Payer: Self-pay | Admitting: Family Medicine

## 2022-12-11 ENCOUNTER — Inpatient Hospital Stay
Admission: AD | Admit: 2022-12-11 | Discharge: 2022-12-14 | DRG: 291 | Disposition: A | Discharge status: Other Acute Inpatient Hospital | Payer: Medicaid Other | Source: Other Acute Inpatient Hospital | Attending: Student | Admitting: Student

## 2022-12-11 DIAGNOSIS — F603 Borderline personality disorder: Secondary | ICD-10-CM | POA: Diagnosis present

## 2022-12-11 DIAGNOSIS — Z888 Allergy status to other drugs, medicaments and biological substances status: Secondary | ICD-10-CM

## 2022-12-11 DIAGNOSIS — I5022 Chronic systolic (congestive) heart failure: Secondary | ICD-10-CM | POA: Diagnosis present

## 2022-12-11 DIAGNOSIS — F32A Depression, unspecified: Secondary | ICD-10-CM | POA: Diagnosis present

## 2022-12-11 DIAGNOSIS — I5020 Unspecified systolic (congestive) heart failure: Secondary | ICD-10-CM | POA: Diagnosis present

## 2022-12-11 DIAGNOSIS — Z79899 Other long term (current) drug therapy: Secondary | ICD-10-CM

## 2022-12-11 DIAGNOSIS — R55 Syncope and collapse: Secondary | ICD-10-CM | POA: Diagnosis present

## 2022-12-11 DIAGNOSIS — N186 End stage renal disease: Secondary | ICD-10-CM | POA: Diagnosis present

## 2022-12-11 DIAGNOSIS — G629 Polyneuropathy, unspecified: Secondary | ICD-10-CM | POA: Diagnosis present

## 2022-12-11 DIAGNOSIS — M25521 Pain in right elbow: Secondary | ICD-10-CM | POA: Diagnosis present

## 2022-12-11 DIAGNOSIS — E669 Obesity, unspecified: Secondary | ICD-10-CM | POA: Diagnosis present

## 2022-12-11 DIAGNOSIS — I10 Essential (primary) hypertension: Secondary | ICD-10-CM

## 2022-12-11 DIAGNOSIS — D649 Anemia, unspecified: Principal | ICD-10-CM | POA: Diagnosis present

## 2022-12-11 DIAGNOSIS — G8929 Other chronic pain: Secondary | ICD-10-CM | POA: Diagnosis present

## 2022-12-11 DIAGNOSIS — F17210 Nicotine dependence, cigarettes, uncomplicated: Secondary | ICD-10-CM | POA: Diagnosis present

## 2022-12-11 DIAGNOSIS — F1190 Opioid use, unspecified, uncomplicated: Secondary | ICD-10-CM | POA: Diagnosis present

## 2022-12-11 DIAGNOSIS — R197 Diarrhea, unspecified: Secondary | ICD-10-CM | POA: Diagnosis present

## 2022-12-11 DIAGNOSIS — E875 Hyperkalemia: Secondary | ICD-10-CM | POA: Diagnosis not present

## 2022-12-11 DIAGNOSIS — R42 Dizziness and giddiness: Secondary | ICD-10-CM | POA: Diagnosis present

## 2022-12-11 DIAGNOSIS — Z8249 Family history of ischemic heart disease and other diseases of the circulatory system: Secondary | ICD-10-CM

## 2022-12-11 DIAGNOSIS — N2581 Secondary hyperparathyroidism of renal origin: Secondary | ICD-10-CM | POA: Diagnosis present

## 2022-12-11 DIAGNOSIS — D631 Anemia in chronic kidney disease: Secondary | ICD-10-CM | POA: Diagnosis present

## 2022-12-11 DIAGNOSIS — M792 Neuralgia and neuritis, unspecified: Secondary | ICD-10-CM | POA: Insufficient documentation

## 2022-12-11 DIAGNOSIS — Z833 Family history of diabetes mellitus: Secondary | ICD-10-CM

## 2022-12-11 DIAGNOSIS — Z6832 Body mass index (BMI) 32.0-32.9, adult: Secondary | ICD-10-CM

## 2022-12-11 DIAGNOSIS — J45909 Unspecified asthma, uncomplicated: Secondary | ICD-10-CM | POA: Diagnosis present

## 2022-12-11 DIAGNOSIS — R45851 Suicidal ideations: Secondary | ICD-10-CM | POA: Diagnosis present

## 2022-12-11 DIAGNOSIS — M25522 Pain in left elbow: Secondary | ICD-10-CM | POA: Diagnosis present

## 2022-12-11 DIAGNOSIS — G4700 Insomnia, unspecified: Secondary | ICD-10-CM | POA: Diagnosis present

## 2022-12-11 DIAGNOSIS — M545 Low back pain, unspecified: Secondary | ICD-10-CM | POA: Diagnosis present

## 2022-12-11 DIAGNOSIS — F4310 Post-traumatic stress disorder, unspecified: Secondary | ICD-10-CM | POA: Diagnosis present

## 2022-12-11 DIAGNOSIS — I132 Hypertensive heart and chronic kidney disease with heart failure and with stage 5 chronic kidney disease, or end stage renal disease: Principal | ICD-10-CM | POA: Diagnosis present

## 2022-12-11 DIAGNOSIS — Z91158 Patient's noncompliance with renal dialysis for other reason: Secondary | ICD-10-CM

## 2022-12-11 DIAGNOSIS — Z992 Dependence on renal dialysis: Secondary | ICD-10-CM

## 2022-12-11 LAB — GLUCOSE, CAPILLARY: Glucose-Capillary: 125 mg/dL — ABNORMAL HIGH (ref 70–99)

## 2022-12-11 LAB — RENAL FUNCTION PANEL
Albumin: 2.6 g/dL — ABNORMAL LOW (ref 3.5–5.0)
Anion gap: 14 (ref 5–15)
BUN: 93 mg/dL — ABNORMAL HIGH (ref 6–20)
CO2: 21 mmol/L — ABNORMAL LOW (ref 22–32)
Calcium: 9.2 mg/dL (ref 8.9–10.3)
Chloride: 101 mmol/L (ref 98–111)
Creatinine, Ser: 7.61 mg/dL — ABNORMAL HIGH (ref 0.44–1.00)
GFR, Estimated: 7 mL/min — ABNORMAL LOW (ref >60)
Glucose, Bld: 129 mg/dL — ABNORMAL HIGH (ref 70–99)
Phosphorus: 6.1 mg/dL — ABNORMAL HIGH (ref 2.5–4.6)
Potassium: 4.8 mmol/L (ref 3.5–5.1)
Sodium: 136 mmol/L (ref 135–145)

## 2022-12-11 LAB — CBC
HCT: 22.2 % — ABNORMAL LOW (ref 36.0–46.0)
Hemoglobin: 6.6 g/dL — ABNORMAL LOW (ref 12.0–15.0)
MCH: 28.4 pg (ref 26.0–34.0)
MCHC: 29.7 g/dL — ABNORMAL LOW (ref 30.0–36.0)
MCV: 95.7 fL (ref 80.0–100.0)
Platelets: 124 10*3/uL — ABNORMAL LOW (ref 150–400)
RBC: 2.32 MIL/uL — ABNORMAL LOW (ref 3.87–5.11)
RDW: 19.8 % — ABNORMAL HIGH (ref 11.5–15.5)
WBC: 6.8 10*3/uL (ref 4.0–10.5)
nRBC: 0 % (ref 0.0–0.2)

## 2022-12-11 LAB — PREPARE RBC (CROSSMATCH)

## 2022-12-11 LAB — MRSA NEXT GEN BY PCR, NASAL: MRSA by PCR Next Gen: NOT DETECTED

## 2022-12-11 MED ORDER — SODIUM CHLORIDE 0.9% IV SOLUTION
Freq: Once | INTRAVENOUS | Status: AC
  Administered 2022-12-11: 25 mL via INTRAVENOUS

## 2022-12-11 MED ORDER — ACETAMINOPHEN 325 MG PO TABS
650.0000 mg | ORAL_TABLET | Freq: Once | ORAL | Status: AC
  Administered 2022-12-11: 650 mg via ORAL
  Filled 2022-12-11: qty 2

## 2022-12-11 MED ORDER — GABAPENTIN 100 MG PO CAPS
100.0000 mg | ORAL_CAPSULE | Freq: Two times a day (BID) | ORAL | Status: DC
  Administered 2022-12-12 – 2022-12-14 (×5): 100 mg via ORAL
  Filled 2022-12-11 (×5): qty 1

## 2022-12-11 MED ORDER — CHLORHEXIDINE GLUCONATE CLOTH 2 % EX PADS: 6.0000 | MEDICATED_PAD | Freq: Every day | CUTANEOUS | Status: DC

## 2022-12-11 MED ORDER — GABAPENTIN 100 MG PO CAPS: 100.0000 mg | ORAL_CAPSULE | Freq: Two times a day (BID) | ORAL | Status: AC

## 2022-12-11 MED ORDER — LIDOCAINE-PRILOCAINE 2.5-2.5 % EX CREA: 1.0000 | TOPICAL_CREAM | CUTANEOUS | 0 refills | Status: AC | PRN

## 2022-12-11 MED ORDER — ARIPIPRAZOLE 15 MG PO TABS
7.5000 mg | ORAL_TABLET | Freq: Every day | ORAL | Status: DC
  Administered 2022-12-13 – 2022-12-14 (×2): 7.5 mg via ORAL
  Filled 2022-12-11 (×3): qty 1

## 2022-12-11 MED ORDER — ACETAMINOPHEN 650 MG RE SUPP: 650.0000 mg | Freq: Four times a day (QID) | RECTAL | Status: DC | PRN

## 2022-12-11 MED ORDER — GABAPENTIN 100 MG PO CAPS
100.0000 mg | ORAL_CAPSULE | Freq: Two times a day (BID) | ORAL | Status: DC
  Administered 2022-12-11: 100 mg via ORAL
  Filled 2022-12-11: qty 1

## 2022-12-11 MED ORDER — PENTAFLUOROPROP-TETRAFLUOROETH EX AERO: 1.0000 | INHALATION_SPRAY | CUTANEOUS | 0 refills | Status: AC | PRN

## 2022-12-11 MED ORDER — ONDANSETRON HCL 4 MG/2ML IJ SOLN: 4.0000 mg | Freq: Four times a day (QID) | INTRAMUSCULAR | Status: DC | PRN

## 2022-12-11 MED ORDER — HEPARIN SODIUM (PORCINE) 1000 UNIT/ML DIALYSIS: 1000.0000 [IU] | INTRAMUSCULAR | Status: AC | PRN

## 2022-12-11 MED ORDER — HYDRALAZINE HCL 50 MG PO TABS
50.0000 mg | ORAL_TABLET | Freq: Three times a day (TID) | ORAL | Status: DC
  Administered 2022-12-11 – 2022-12-13 (×5): 50 mg via ORAL
  Filled 2022-12-11 (×6): qty 1

## 2022-12-11 MED ORDER — CARVEDILOL 6.25 MG PO TABS
6.2500 mg | ORAL_TABLET | Freq: Two times a day (BID) | ORAL | Status: DC
  Administered 2022-12-12 – 2022-12-13 (×4): 6.25 mg via ORAL
  Filled 2022-12-11 (×4): qty 1

## 2022-12-11 MED ORDER — TRAZODONE HCL 50 MG PO TABS
25.0000 mg | ORAL_TABLET | Freq: Every evening | ORAL | Status: DC | PRN
  Administered 2022-12-12: 25 mg via ORAL
  Filled 2022-12-11: qty 1

## 2022-12-11 MED ORDER — DULOXETINE HCL 20 MG PO CPEP: 20.0000 mg | ORAL_CAPSULE | Freq: Every day | ORAL | 3 refills | Status: DC

## 2022-12-11 MED ORDER — ONDANSETRON HCL 4 MG PO TABS: 4.0000 mg | ORAL_TABLET | Freq: Four times a day (QID) | ORAL | Status: DC | PRN

## 2022-12-11 MED ORDER — CALCIUM ACETATE (PHOS BINDER) 667 MG PO CAPS
667.0000 mg | ORAL_CAPSULE | Freq: Three times a day (TID) | ORAL | Status: DC
  Administered 2022-12-12 – 2022-12-14 (×7): 667 mg via ORAL
  Filled 2022-12-11 (×8): qty 1

## 2022-12-11 MED ORDER — CALCITRIOL 0.25 MCG PO CAPS
0.2500 ug | ORAL_CAPSULE | ORAL | Status: DC
  Filled 2022-12-11: qty 1

## 2022-12-11 MED ORDER — LIDOCAINE-PRILOCAINE 2.5-2.5 % EX CREA: 1.0000 | TOPICAL_CREAM | CUTANEOUS | Status: DC | PRN

## 2022-12-11 MED ORDER — CHLORHEXIDINE GLUCONATE CLOTH 2 % EX PADS
6.0000 | MEDICATED_PAD | Freq: Every day | CUTANEOUS | Status: DC
  Administered 2022-12-12 – 2022-12-14 (×2): 6 via TOPICAL

## 2022-12-11 MED ORDER — DIPHENHYDRAMINE HCL 25 MG PO CAPS
25.0000 mg | ORAL_CAPSULE | Freq: Once | ORAL | Status: AC
  Administered 2022-12-11: 25 mg via ORAL
  Filled 2022-12-11: qty 1

## 2022-12-11 MED ORDER — PENTAFLUOROPROP-TETRAFLUOROETH EX AERO: 1.0000 | INHALATION_SPRAY | CUTANEOUS | Status: DC | PRN

## 2022-12-11 MED ORDER — ALTEPLASE 2 MG IJ SOLR: 2.0000 mg | Freq: Once | INTRAMUSCULAR | Status: AC | PRN

## 2022-12-11 MED ORDER — FUROSEMIDE 10 MG/ML IJ SOLN
20.0000 mg | Freq: Once | INTRAMUSCULAR | Status: AC
  Administered 2022-12-11: 20 mg via INTRAVENOUS
  Filled 2022-12-11: qty 2

## 2022-12-11 MED ORDER — ALTEPLASE 2 MG IJ SOLR: 2.0000 mg | Freq: Once | INTRAMUSCULAR | Status: DC | PRN

## 2022-12-11 MED ORDER — ENOXAPARIN SODIUM 40 MG/0.4ML IJ SOSY: 40.0000 mg | PREFILLED_SYRINGE | INTRAMUSCULAR | Status: DC

## 2022-12-11 MED ORDER — ANTICOAGULANT SODIUM CITRATE 4% (200MG/5ML) IV SOLN: 5.0000 mL | Status: AC | PRN

## 2022-12-11 MED ORDER — SODIUM CHLORIDE 0.9 % IV SOLN
300.0000 mg | Freq: Once | INTRAVENOUS | Status: AC
  Administered 2022-12-12: 300 mg via INTRAVENOUS
  Filled 2022-12-11: qty 300

## 2022-12-11 MED ORDER — SODIUM CHLORIDE 0.9% IV SOLUTION: Freq: Once | INTRAVENOUS | Status: DC

## 2022-12-11 MED ORDER — DULOXETINE HCL 20 MG PO CPEP
20.0000 mg | ORAL_CAPSULE | Freq: Every day | ORAL | Status: DC
  Administered 2022-12-12 – 2022-12-14 (×3): 20 mg via ORAL
  Filled 2022-12-11 (×3): qty 1

## 2022-12-11 MED ORDER — PENTAFLUOROPROP-TETRAFLUOROETH EX AERO
INHALATION_SPRAY | CUTANEOUS | Status: AC
  Administered 2022-12-11: 1 via TOPICAL
  Filled 2022-12-11: qty 30

## 2022-12-11 MED ORDER — TORSEMIDE 100 MG PO TABS
100.0000 mg | ORAL_TABLET | Freq: Every morning | ORAL | Status: DC
  Administered 2022-12-12 – 2022-12-14 (×3): 100 mg via ORAL
  Filled 2022-12-11 (×2): qty 1
  Filled 2022-12-11: qty 5

## 2022-12-11 MED ORDER — SODIUM CHLORIDE 0.9% IV SOLUTION: 100.0000 mL | Freq: Once | INTRAVENOUS | 0 refills | Status: DC

## 2022-12-11 MED ORDER — ORAL CARE MOUTH RINSE: 15.0000 mL | OROMUCOSAL | Status: DC | PRN

## 2022-12-11 MED ORDER — HEPARIN SODIUM (PORCINE) 5000 UNIT/ML IJ SOLN
5000.0000 [IU] | Freq: Three times a day (TID) | INTRAMUSCULAR | Status: DC
  Administered 2022-12-12 – 2022-12-14 (×3): 5000 [IU] via SUBCUTANEOUS
  Filled 2022-12-11 (×5): qty 1

## 2022-12-11 MED ORDER — ANTICOAGULANT SODIUM CITRATE 4% (200MG/5ML) IV SOLN: 5.0000 mL | Status: DC | PRN

## 2022-12-11 MED ORDER — SEVELAMER CARBONATE 800 MG PO TABS
1600.0000 mg | ORAL_TABLET | Freq: Three times a day (TID) | ORAL | Status: DC
  Administered 2022-12-12 – 2022-12-14 (×7): 1600 mg via ORAL
  Filled 2022-12-11 (×7): qty 2

## 2022-12-11 MED ORDER — DULOXETINE HCL 20 MG PO CPEP
20.0000 mg | ORAL_CAPSULE | Freq: Every day | ORAL | Status: DC
  Administered 2022-12-11: 20 mg via ORAL
  Filled 2022-12-11: qty 1

## 2022-12-11 MED ORDER — SODIUM CHLORIDE 0.9 % IV SOLN
300.0000 mg | Freq: Once | INTRAVENOUS | Status: DC
  Filled 2022-12-11: qty 15

## 2022-12-11 MED ORDER — ISOSORBIDE MONONITRATE ER 30 MG PO TB24
60.0000 mg | ORAL_TABLET | Freq: Every day | ORAL | Status: DC
  Administered 2022-12-12 – 2022-12-14 (×3): 60 mg via ORAL
  Filled 2022-12-11 (×3): qty 2

## 2022-12-11 MED ORDER — MAGNESIUM HYDROXIDE 400 MG/5ML PO SUSP: 30.0000 mL | Freq: Every day | ORAL | Status: DC | PRN

## 2022-12-11 MED ORDER — DARBEPOETIN ALFA 200 MCG/0.4ML IJ SOSY: 200.0000 ug | PREFILLED_SYRINGE | INTRAMUSCULAR | Status: AC

## 2022-12-11 MED ORDER — SODIUM CHLORIDE 0.9 % IV SOLN: 300.0000 mg | Freq: Once | INTRAVENOUS | Status: DC

## 2022-12-11 MED ORDER — ACETAMINOPHEN 325 MG PO TABS: 650.0000 mg | ORAL_TABLET | Freq: Four times a day (QID) | ORAL | Status: DC | PRN

## 2022-12-11 NOTE — Progress Notes (Signed)
Recreation Therapy Notes    Date: 12/11/2022  Time: 9:50 am  Location: Craft room     Behavioral response: N/A   Intervention Topic: Problem Solving  Discussion/Intervention: Patient unable to attend group.  Clinical Observations/Feedback:  Patient unable to attend group.    Keshanna Riso LRT/CTRS        Lyn Joens 12/11/2022 12:42 PM

## 2022-12-11 NOTE — [facility] Counselor (Signed)
CSW attempted to meet with pt for completion of the PSA. CSW introduced himself and explained that he was trying to meet with the pt to complete the assessment. As CSW was talking, pt fell asleep. CSW spoke to inform her that it she did not have to do it at this moment. Pt stated that she was tired and would be agreeable to doing it at a later time. CSW agreed, stating that he would check in with her later. No other concerns expressed. Contact ended without incident.   Chalmers Guest. Guerry Bruin, MSW, [place], [place] 12/11/2022 2:31 PM

## 2022-12-11 NOTE — Consult Note (Signed)
History and Physical    Patient: Natasha Long OHY:[id number] DOB: [birth year] DOA: 12/09/2022 DOS: the patient was seen and examined on 12/11/2022 PCP: [name], MD  Patient coming from: Homeless  Chief Complaint: No chief complaint on file.  HPI: Natasha Long is a 27 y.o. female with medical history significant of  ESRD on HD, congestive heart failure, hypertension, PTSD with borderline personality disorder, noncompliance with hemodialysis had recent admission on 12/04/2022 for  missed hemodialysis, underwent dialysis treatments, improved was discharged.  Patient  is brought to the emergency department with the depression with suicidal ideation, admitted to [facility] on 12/10/2022.  Concerning about multiple medical conditions hospitalist service is consulted for medical management.  Patient is complaining of burning sensation in the lower extremities.  Denies having any cough, shortness of breath, nausea, vomiting.  States having diarrhea for the last 3 days watery stools.  Denies any dysuria, only makes very small amount of urine  Review of Systems: As mentioned in the history of present illness. All other systems reviewed and are negative. Past Medical History:  Diagnosis Date   Asthma    as a child, no problem as an adult, no inhaler   CHF (congestive heart failure) (HCC)    Complication of anesthesia    woke up before tube removed, 1 time fought nurses   ESRD on hemodialysis [facility])    M-W-F   History of borderline personality disorder    Hypertension    diagnosed as child; stopped meds at 6 yo   Insomnia    Neuromuscular disorder ([place])    peripheral neuropathy   Nonspecific chest pain    PTSD (post-traumatic stress disorder)    Past Surgical History:  Procedure Laterality Date   AV FISTULA PLACEMENT Left 10/18/2020   Procedure: LEFT ARM ARTERIOVENOUS (AV) FISTULA CREATION;  Surgeon: Serafina Mitchell, MD;  [facility];  Service: Vascular;   Laterality: Left;   CHOLECYSTECTOMY     extraction of wisdom teeth     FISTULA SUPERFICIALIZATION Left 02/13/2021   Procedure: LEFT BRACHIOCEPHALIC ARTERIOVENOUS FISTULA SUPERFICIALIZATION;  Surgeon: Serafina Mitchell, MD;  Location: [place];  Service: Vascular;  Laterality: Left;   I & D EXTREMITY Left 01/30/2022   Procedure: IRRIGATION AND DEBRIDEMENT EXTREMITY;  Surgeon: Milly Jakob, MD;  Location: [place];  Service: Orthopedics;  Laterality: Left;   INCISION AND DRAINAGE OF WOUND Left 01/30/2022   Procedure: LEFT WRIST ASPIRATION;  Surgeon: Vanetta Mulders, MD;  Location: [place];  Service: Orthopedics;  Laterality: Left;   KNEE ARTHROSCOPY Right 01/30/2022   Procedure: ARTHROSCOPY KNEE AND IRRIIGATION AND DEBRIDMENT; LEFT WRIST ASPIRATION;  Surgeon: Vanetta Mulders, MD;  Location: [place];  Service: Orthopedics;  Laterality: Right;   RENAL BIOPSY     x 2   TUNNELED VENOUS CATHETER PLACEMENT  02/11/2021   [facility]   Social History:  reports that she has been smoking cigarettes. She has a 5.00 pack-year smoking history. She has never used smokeless tobacco. She reports that she does not currently use alcohol. She reports current drug use. Drug: Marijuana.  Allergies  Allergen Reactions   Bupropion Anxiety, Other (See Comments) and Nausea And Vomiting    Caused panic attacks  Caused panic attacks    "Adverse effects"   Prozac [Fluoxetine Hcl] Anxiety and Other (See Comments)    Caused panic attacks. Dizziness    Prednisone Other (See Comments)    Pt stated this med caused pancreatitis. Dizziness    Family History  Adopted: Yes  Problem Relation Age of Onset   Diabetes Other    Hypertension Other     Prior to Admission medications   Medication Sig Start Date End Date Taking? Authorizing Provider  ARIPiprazole (ABILIFY) 5 MG tablet Take 1.5 tablets (7.5 mg total) by mouth daily. 12/07/22   Darliss Cheney, MD  calcitRIOL (ROCALTROL) 0.25 MCG capsule Take 1 capsule (0.25 mcg total)  by mouth Every Tuesday,Thursday,and Saturday with dialysis. 12/08/22   Darliss Cheney, MD  calcium acetate (PHOSLO) 667 MG capsule Take 1 capsule (667 mg total) by mouth 3 (three) times daily with meals. 12/07/22   Darliss Cheney, MD  carvedilol (COREG) 6.25 MG tablet Take 1 tablet (6.25 mg total) by mouth 2 (two) times daily with a meal. 12/07/22 01/06/23  Darliss Cheney, MD  hydrALAZINE (APRESOLINE) 50 MG tablet Take 1 tablet (50 mg total) by mouth every 8 (eight) hours. 12/07/22 01/06/23  Darliss Cheney, MD  isosorbide mononitrate (IMDUR) 60 MG 24 hr tablet Take 1 tablet (60 mg total) by mouth daily. 12/07/22 01/06/23  Darliss Cheney, MD  sevelamer carbonate (RENVELA) 800 MG tablet Take 2 tablets (1,600 mg total) by mouth 3 (three) times daily with meals. 12/07/22   Darliss Cheney, MD  torsemide (DEMADEX) 100 MG tablet Take 1 tablet (100 mg total) by mouth every morning. 12/07/22   Darliss Cheney, MD    Physical Exam: Vitals:   12/11/22 1200 12/11/22 1230 12/11/22 1234 12/11/22 1254  BP: 131/88 (!) 133/91 (!) 136/90   Pulse: (!) 110 (!) 111 (!) 110   Resp: (!) 24 (!) 24 (!) 27   Temp:   97.7 F (36.5 C)   TempSrc:   Oral   SpO2: 98% 97% 91%   Weight:    90.7 kg  Height:        Well-built well-nourished female laying down in the bed not in distress HEENT head normocephalic atraumatic is no scleral icterus Neck supple no lymphadenopathy no JVD no carotid PE Chest as no focal tenderness Lungs bilateral clear to auscultation Heart S1-S2 regular no murmurs are heard Abdomen bowel sounds plus soft nontender nondistended Extremities no pedal edema Neuro patient is alert oriented to place place person and time cranial nerves 2-12 intact, no motor or sensory deficits. Data Reviewed:  Reviewed independently Assessment and Plan:    Depression with suicidal ideation - psychiatry is following with the patient  -check TSH.    Anemia of chronic disease, iron deficiency anemia -patient's hemoglobin trended down to  6.6 - iron profile done from 10/25/2022-showed iron 13, TIBC 217,% saturation 6 -keep the patient on IV iron -check B12, retic count level considering patient's high normal MCV   Diarrhea  - check C diff toxin  End-stage renal disease on hemodialysis - nephrology is following with the patient  -continue with hemodialysis.    Hypertension -continue home regimen with Coreg, hydralazine, Imdur, torsemide.    Peripheral neuropathy  - added gabapentin    Advance Care Planning:   Code Status: Full Code   Consults:  nephrology  Family Communication:  explain to the patient.  States does not have any family  Severity of Illness: depression with suicidal ideation The appropriate patient status for this patient is INPATIENT. Inpatient status is judged to be reasonable and necessary in order to provide the required intensity of service to ensure the patient's safety. The patient's presenting symptoms, physical exam findings, and initial radiographic and laboratory data in the context of their chronic comorbidities is felt  to place them at high risk for further clinical deterioration. Furthermore, it is not anticipated that the patient will be medically stable for discharge from the hospital within 2 midnights of admission.   * I certify that at the point of admission it is my clinical judgment that the patient will require inpatient hospital care spanning beyond 2 midnights from the point of admission due to high intensity of service, high risk for further deterioration and high frequency of surveillance required.*  AuthorMonica Becton, MD 12/11/2022 5:33 PM  For on call review [email or web address].

## 2022-12-11 NOTE — Progress Notes (Signed)
Recreation Therapy Notes  INPATIENT RECREATION THERAPY ASSESSMENT  Patient Details Name: Natasha Long MRN: [id number] DOB: 06-17-1996 Today's Date: 12/11/2022       Information Obtained From: Patient (Unable to at this time)  Able to Participate in Assessment/Interview:    Patient Presentation:    Reason for Admission (Per Patient):    Patient Stressors:    Coping Skills:      Leisure Interests (2+):     Frequency of Recreation/Participation:    Awareness of Community Resources:     [employer]:     Current Use:    If no, Barriers?:    Expressed Interest in [place] of Residence:     Patient Main Form of Transportation:    Patient Strengths:     Patient Identified Areas of Improvement:     Patient Goal for Hospitalization:     Current SI (including self-harm):     Current HI:     Current AVH:    Staff Intervention Plan:    Consent to Intern Participation:    Natasha Long 12/11/2022, 3:11 PM

## 2022-12-11 NOTE — Discharge Summary (Signed)
Physician Discharge Summary Note  Patient:  Natasha Long is an 27 y.o., female MRN:  [id number] DOB:  03-15-1996 Patient phone:  [phone] (home)  Patient address:   [place] [address],  Total Time spent with patient: 20 minutes  Date of Admission:  12/09/2022 Date of Discharge: 12/11/2022  Reason for Admission:  admitted for depression and suicidal ideation  Principal Problem: Severe recurrent major depression without psychotic features [facility]) Discharge Diagnoses: Principal Problem:   Severe recurrent major depression without psychotic features ([place]) Active Problems:   PTSD (post-traumatic stress disorder)   HTN (hypertension)   Borderline personality disorder (HCC)   Moderate cannabis use disorder (HCC)   Cocaine use, 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[place])   Suicidal ideation   ESRD (end stage renal disease) on dialysis ([place])   Homelessness   Past Psychiatric History: chronic severe depression, substance abuse, suicide attempts  Past Medical History:  Past Medical History:  Diagnosis Date   Asthma    as a child, no problem as an adult, no inhaler   CHF (congestive heart failure) ([place])    Complication of anesthesia    woke up before tube removed, 1 time fought nurses   ESRD on hemodialysis ([place])    M-W-F   History of borderline personality disorder    Hypertension    diagnosed as child; stopped meds at 67 yo   Insomnia    Neuromuscular disorder ([place])    peripheral neuropathy   Nonspecific chest pain    PTSD (post-traumatic stress disorder)     Past Surgical History:  Procedure Laterality Date   AV FISTULA PLACEMENT Left 10/18/2020   Procedure: LEFT ARM ARTERIOVENOUS (AV) FISTULA CREATION;  Surgeon: Serafina Mitchell, MD;  [facility];  Service: Vascular;  Laterality: Left;   CHOLECYSTECTOMY     extraction of wisdom teeth     FISTULA SUPERFICIALIZATION Left 02/13/2021   Procedure: LEFT BRACHIOCEPHALIC ARTERIOVENOUS  FISTULA SUPERFICIALIZATION;  Surgeon: Serafina Mitchell, MD;  Location: [place];  Service: Vascular;  Laterality: Left;   I & D EXTREMITY Left 01/30/2022   Procedure: IRRIGATION AND DEBRIDEMENT EXTREMITY;  Surgeon: Milly Jakob, MD;  Location: [place];  Service: Orthopedics;  Laterality: Left;   INCISION AND DRAINAGE OF WOUND Left 01/30/2022   Procedure: LEFT WRIST ASPIRATION;  Surgeon: Vanetta Mulders, MD;  Location: [place];  Service: Orthopedics;  Laterality: Left;   KNEE ARTHROSCOPY Right 01/30/2022   Procedure: ARTHROSCOPY KNEE AND IRRIIGATION AND DEBRIDMENT; LEFT WRIST ASPIRATION;  Surgeon: Vanetta Mulders, MD;  Location: [place];  Service: Orthopedics;  Laterality: Right;   RENAL BIOPSY     x 2   TUNNELED VENOUS CATHETER PLACEMENT  02/11/2021   [facility]   Family History:  Family History  Adopted: Yes  Problem Relation Age of Onset   Diabetes Other    Hypertension Other    Family Psychiatric  History: none Social History:  Social History   Substance and Sexual Activity  Alcohol Use Not Currently   Comment: "maybe 3 a month."- liquor     Social History   Substance and Sexual Activity  Drug Use Yes   Types: Marijuana    Social History   Socioeconomic History   Marital status: Soil scientist    Spouse name: Not on file   Number of children: 1   Years of education: Not on file   Highest education level: 12th grade  Occupational History   Occupation: unemployed  Tobacco Use  Smoking status: Every Day    Packs/day: 0.50    Years: 10.00    Total pack years: 5.00    Types: Cigarettes   Smokeless tobacco: Never  Vaping Use   Vaping Use: Never used  Substance and Sexual Activity   Alcohol use: Not Currently    Comment: "maybe 3 a month."- liquor   Drug use: Yes    Types: Marijuana   Sexual activity: Not Currently    Birth control/protection: None  Other Topics Concern   Not on file  Social History Narrative   Not on file   Social Determinants of Health    Financial Resource Strain: Unknown (01/13/2019)   Overall Financial Resource Strain (CARDIA)    Difficulty of Paying Living Expenses: Patient refused  Food Insecurity: Food Insecurity Present (12/09/2022)   Hunger Vital Sign    Worried About Running Out of Food in the Last Year: Often true    Ran Out of Food in the Last Year: Often true  Transportation Needs: Unmet Transportation Needs (12/09/2022)   PRAPARE - Hydrologist (Medical): Yes    Lack of Transportation (Non-Medical): Yes  Physical Activity: Unknown (01/13/2019)   Exercise Vital Sign    Days of Exercise per Week: Patient refused    Minutes of Exercise per Session: Patient refused  Stress: Unknown (01/13/2019)   [employer] [place] of Stress : Patient refused  Social Connections: Unknown (01/13/2019)   Social Connection and Isolation Panel [NHANES]    Frequency of Communication with Friends and Family: Patient refused    Frequency of Social Gatherings with Friends and Family: Patient refused    Attends Religious Services: Patient refused    Marine scientist or Organizations: Patient refused    Attends Archivist Meetings: Patient refused    Marital Status: Patient refused    Hospital Course:  developed anemia and fatigue. Seen by medicine. Transferred to medicine for transfusion  Physical Findings: AIMS: Facial and Oral Movements Muscles of Facial Expression: None, normal Lips and Perioral Area: None, normal Jaw: None, normal Tongue: None, normal,Extremity Movements Upper (arms, wrists, hands, fingers): None, normal Lower (legs, knees, ankles, toes): None, normal, Trunk Movements Neck, shoulders, hips: None, normal, Overall Severity Severity of abnormal movements (highest score from questions above): None, normal Incapacitation due to abnormal movements: None, normal Patient's awareness of abnormal movements  (rate only patient's report): No Awareness, Dental Status Current problems with teeth and/or dentures?: No Does patient usually wear dentures?: No  CIWA:    COWS:     Musculoskeletal: Strength & Muscle Tone: within normal limits Gait & Station: unable to stand Patient leans: N/A   Psychiatric Specialty Exam:  Presentation  General Appearance:  Casual  Eye Contact: Fair  Speech: Clear and Coherent  Speech Volume: Normal  Handedness: Right   Mood and Affect  Mood: Depressed; Anxious  Affect: Congruent; Depressed   Thought Process  Thought Processes: Coherent  Descriptions of Associations:Intact  Orientation:Full (Time, Place and Person)  Thought Content:Illogical  History of Schizophrenia/Schizoaffective disorder:No  Duration of Psychotic Symptoms:No data recorded Hallucinations:No data recorded Ideas of Reference:None  Suicidal Thoughts:No data recorded Homicidal Thoughts:No data recorded  Sensorium  Memory: Immediate Good; Recent Good; Remote Good  Judgment: -- (impulsive)  Insight: Lacking   Executive Functions  Concentration: Fair  Attention Span: Fair  Recall: [place] of Knowledge: Good  Language: Good   Psychomotor Activity  Psychomotor Activity:No data recorded  Assets  Assets: Communication Skills; Desire for Improvement; Financial Resources/Insurance   Sleep  Sleep:No data recorded   Physical Exam: Physical Exam Vitals and nursing note reviewed.  Constitutional:      Appearance: Normal appearance.  HENT:     Head: Normocephalic and atraumatic.     Mouth/Throat:     Pharynx: Oropharynx is clear.  Eyes:     Pupils: Pupils are equal, round, and reactive to light.  Cardiovascular:     Rate and Rhythm: Normal rate and regular rhythm.  Pulmonary:     Effort: Pulmonary effort is normal.     Breath sounds: Normal breath sounds.  Abdominal:     General: Abdomen is flat.     Palpations: Abdomen is  soft.  Musculoskeletal:        General: Normal range of motion.  Skin:    General: Skin is warm and dry.  Neurological:     General: No focal deficit present.     Mental Status: She is alert. Mental status is at baseline.     Motor: Weakness present.  Psychiatric:        Attention and Perception: She is inattentive.        Mood and Affect: Mood normal. Affect is blunt.        Speech: Speech is delayed.        Behavior: Behavior is slowed.        Thought Content: Thought content includes suicidal ideation. Thought content does not include suicidal plan.    Review of Systems  Constitutional: Negative.   HENT: Negative.    Eyes: Negative.   Respiratory: Negative.    Cardiovascular: Negative.   Gastrointestinal: Negative.   Musculoskeletal: Negative.   Skin: Negative.   Neurological: Negative.   Psychiatric/Behavioral:  Positive for depression and suicidal ideas. The patient is nervous/anxious and has insomnia.    Blood pressure (!) 136/90, pulse (!) 110, temperature 97.7 F (36.5 C), temperature source Oral, resp. rate (!) 27, height 5\' 8"  (1.727 m), weight 90.7 kg, SpO2 91 %. Body mass index is 30.4 kg/m.   Social History   Tobacco Use  Smoking Status Every Day   Packs/day: 0.50   Years: 10.00   Total pack years: 5.00   Types: Cigarettes  Smokeless Tobacco Never   Tobacco Cessation:  A prescription for an FDA-approved tobacco cessation medication was offered at discharge and the patient refused   Blood Alcohol level:  Lab Results  Component Value Date   [facility] <10 12/08/2022   ETH <10 17/79/3903    Metabolic Disorder Labs:  Lab Results  Component Value Date   HGBA1C 5.4 12/24/2021   MPG 108 12/24/2021   MPG 105.41 01/16/2021   No results found for: "PROLACTIN" Lab Results  Component Value Date   CHOL 109 10/04/2022   TRIG 66 10/04/2022   HDL 54 10/04/2022   CHOLHDL 2.0 10/04/2022   VLDL 13 10/04/2022   LDLCALC 42 10/04/2022   LDLCALC 54 12/24/2021     See Psychiatric Specialty Exam and Suicide Risk Assessment completed by Attending Physician prior to discharge.  Discharge destination:  Other:  traansfer to medical floor  Is patient on multiple antipsychotic therapies at discharge:  No   Has Patient had three or more failed trials of antipsychotic monotherapy by history:  No  Recommended Plan for Multiple Antipsychotic Therapies: NA  Discharge Instructions     Diet - low sodium heart healthy   Complete by: As  directed    Increase activity slowly   Complete by: As directed    No wound care   Complete by: As directed       Allergies as of 12/11/2022       Reactions   Bupropion Anxiety, Other (See Comments), Nausea And Vomiting   Caused panic attacks Caused panic attacks    "Adverse effects"   Prozac [fluoxetine Hcl] Anxiety, Other (See Comments)   Caused panic attacks. Dizziness    Prednisone Other (See Comments)   Pt stated this med caused pancreatitis. Dizziness        Medication List     TAKE these medications      Indication  alteplase 2 MG injection Commonly known as: CATHFLO ACTIVASE 2 mg by Intracatheter route once as needed for open catheter.  Indication: Clot of Peritoneal Dialysis Catheter   anticoagulant sodium citrate 4 GM/100ML Soln 5 mLs by Intracatheter route as needed (in dialysis, If TTP or HIT.).  Indication: dialysis   ARIPiprazole 5 MG tablet Commonly known as: ABILIFY Take 1.5 tablets (7.5 mg total) by mouth daily.  Indication: Major Depressive Disorder   calcitRIOL 0.25 MCG capsule Commonly known as: ROCALTROL Take 1 capsule (0.25 mcg total) by mouth Every Tuesday,Thursday,and Saturday with dialysis.  Indication: Low Amount of Calcium in the Blood   calcium acetate 667 MG capsule Commonly known as: PHOSLO Take 1 capsule (667 mg total) by mouth 3 (three) times daily with meals.  Indication: High Amount of Phosphate in the Blood   carvedilol 6.25 MG tablet Commonly known as:  COREG Take 1 tablet (6.25 mg total) by mouth 2 (two) times daily with a meal.  Indication: Cardiac Failure, High Blood Pressure Disorder   Chlorhexidine Gluconate Cloth 2 % Pads Apply 6 each topically daily at 6 (six) AM.  Indication: cleaning   Darbepoetin Alfa 200 MCG/0.4ML Sosy injection Commonly known as: ARANESP Inject 0.4 mLs (200 mcg total) into the skin every Wednesday at 6 PM. Start taking on: December 16, 2022  Indication: Anemia associated with Chronic Kidney Failure   DULoxetine 20 MG capsule Commonly known as: CYMBALTA Take 1 capsule (20 mg total) by mouth daily. Start taking on: December 12, 2022  Indication: Diabetes with Nerve Disease   gabapentin 100 MG capsule Commonly known as: NEURONTIN Take 1 capsule (100 mg total) by mouth 2 (two) times daily. Start taking on: December 12, 2022  Indication: Neuropathic Pain   heparin 1000 unit/mL Soln injection 1 mL (1,000 Units total) by Intracatheter route as needed (in dialysis).  Indication: Blood or Blood Product Transfusion   hydrALAZINE 50 MG tablet Commonly known as: APRESOLINE Take 1 tablet (50 mg total) by mouth every 8 (eight) hours.  Indication: High Blood Pressure Disorder   iron sucrose 300 mg in sodium chloride 0.9 % 250 mL Inject 300 mg into the vein once for 1 dose.  Indication: anemia   isosorbide mononitrate 60 MG 24 hr tablet Commonly known as: IMDUR Take 1 tablet (60 mg total) by mouth daily.  Indication: Stable Angina Pectoris   lidocaine-prilocaine cream Commonly known as: EMLA Apply 1 Application topically as needed (topical anesthesia for hemodialysis if [place] and Lidocaine injection are ineffective.).  Indication: Anesthesia to a Specific Part of the Body   pentafluoroprop-tetrafluoroeth Aero Commonly known as: GEBAUERS Apply 1 Application topically as needed (topical anesthesia for hemodialysis).  Indication: pain   sevelamer carbonate 800 MG tablet Commonly known as:  RENVELA Take 2 tablets (1,600 mg total) by mouth  3 (three) times daily with meals.  Indication: High Amount of Phosphate in the Blood   sodium chloride 0.9 % infusion Inject 100 mLs into the vein once for 1 dose.  Indication: Fluid and Electrolyte Disturbance   torsemide 100 MG tablet Commonly known as: DEMADEX Take 1 tablet (100 mg total) by mouth every morning.  Indication: Cardiac Failure, Kidney Disease         Follow-up recommendations:  Other:  ADVISE KEEPING A SITTER ON PATIENT WHILE ON MEDICINE  Comments:  SEE ABOVE. ALSO ORDER PSYCH CONSULT  Signed: Alethia Berthold, MD 12/11/2022, 6:31 PM

## 2022-12-11 NOTE — Progress Notes (Addendum)
[facility]  ROUNDING NOTE   Subjective:   Patient seen and evaluated during dialysis   HEMODIALYSIS FLOWSHEET:  Blood Flow Rate (mL/min): 350 mL/min Arterial Pressure (mmHg): -90 mmHg Venous Pressure (mmHg): 190 mmHg TMP (mmHg): 18 mmHg Ultrafiltration Rate (mL/min): 826 mL/min Dialysate Flow Rate (mL/min): 300 ml/min  Tolerating treatment well seated in chair No complaints Mild lower extremity edema remains  Objective:  Vital signs in last 24 hours:  Temp:  [97.7 F (36.5 C)] 97.7 F (36.5 C) (01/12 1234) Pulse Rate:  [110-118] 110 (01/12 1234) Resp:  [20-27] 27 (01/12 1234) BP: (131-154)/(82-110) 136/90 (01/12 1234) SpO2:  [91 %-100 %] 91 % (01/12 1234) Weight:  [90.7 kg-93.2 kg] 90.7 kg (01/12 1254)  Weight change:  Filed Weights   12/09/22 2100 12/11/22 0839 12/11/22 1254  Weight: 88.8 kg 93.2 kg 90.7 kg    Intake/Output: No intake/output data recorded.   Intake/Output this shift:  Total I/O In: -  Out: 2000 [Other:2000]  Physical Exam: General: NAD  Head: Normocephalic, atraumatic. Moist oral mucosal membranes  Eyes: Anicteric  Lungs:  Clear to auscultation, normal effort  Heart: Regular rate and rhythm  Abdomen:  Soft, nontender  Extremities: 1+ peripheral edema.  Neurologic: Somnolent, moving all four extremities  Skin: No lesions  Access: Left AVF    Basic Metabolic Panel: Recent Labs  Lab 12/05/22 0305 12/06/22 0736 12/07/22 0811 12/08/22 1200 12/11/22 0859  NA 136  137 134* 134* 136 136  K 3.9  3.9 3.6 3.8 3.9 4.8  CL 92*  97* 96* 95* 96* 101  CO2 22  24 25 24 26  21*  GLUCOSE 96  101* 102* 99 111* 129*  BUN 78*  77* 65* 89* 73* 93*  CREATININE 8.10*  8.06* 6.71* 7.89* 6.08* 7.61*  CALCIUM 8.0*  8.1* 8.0* 8.8* 9.0 9.2  PHOS 6.9*  --  5.0*  --  6.1*    Liver Function Tests: Recent Labs  Lab 12/05/22 0305 12/07/22 0811 12/08/22 1200 12/11/22 0859  AST  --   --  43*  --   ALT  --   --  32  --   ALKPHOS   --   --  107  --   BILITOT  --   --  0.6  --   PROT  --   --  6.6  --   ALBUMIN 2.6* 2.2* 2.5* 2.6*   No results for input(s): "LIPASE", "AMYLASE" in the last 168 hours. No results for input(s): "AMMONIA" in the last 168 hours.  CBC: Recent Labs  Lab 12/04/22 2122 12/05/22 0305 12/07/22 0811 12/08/22 1200 12/11/22 0859  WBC 10.5 8.8 10.2 10.8* 6.8  NEUTROABS  --   --   --  8.8*  --   HGB 9.1* 8.0* 8.1* 7.5* 6.6*  HCT 28.8* 25.2* 26.1* 25.2* 22.2*  MCV 92.9 90.0 95.3 98.1 95.7  PLT 158 163 133* 142* 124*    Cardiac Enzymes: No results for input(s): "CKTOTAL", "CKMB", "CKMBINDEX", "TROPONINI" in the last 168 hours.  BNP: Invalid input(s): "POCBNP"  CBG: No results for input(s): "GLUCAP" in the last 168 hours.  Microbiology: Results for orders placed or performed during the hospital encounter of 12/08/22  Resp panel by RT-PCR (RSV, Flu A&B, Covid) Anterior Nasal Swab     Status: None   Collection Time: 12/09/22 12:45 PM   Specimen: Anterior Nasal Swab  Result Value Ref Range Status   SARS Coronavirus 2 by RT PCR NEGATIVE NEGATIVE Final  Comment: (NOTE) SARS-CoV-2 target nucleic acids are NOT DETECTED.  The SARS-CoV-2 RNA is generally detectable in upper respiratory specimens during the acute phase of infection. The lowest concentration of SARS-CoV-2 viral copies this assay can detect is 138 copies/mL. A negative result does not preclude SARS-Cov-2 infection and should not be used as the sole basis for treatment or other patient management decisions. A negative result may occur with  improper specimen collection/handling, submission of specimen other than nasopharyngeal swab, presence of viral mutation(s) within the areas targeted by this assay, and inadequate number of viral copies(<138 copies/mL). A negative result must be combined with clinical observations, patient history, and epidemiological information. The expected result is Negative.  Fact Sheet for  Patients:  EntrepreneurPulse.com.au  Fact Sheet for Healthcare Providers:  IncredibleEmployment.be  This test is no t yet approved or cleared by the [place] FDA and  has been authorized for detection and/or diagnosis of SARS-CoV-2 by FDA under an Emergency Use Authorization (EUA). This EUA will remain  in effect (meaning this test can be used) for the duration of the COVID-19 declaration under Section 564(b)(1) of the Act, 21 U.S.C.section 360bbb-3(b)(1), unless the authorization is terminated  or revoked sooner.       Influenza A by PCR NEGATIVE NEGATIVE Final   Influenza B by PCR NEGATIVE NEGATIVE Final    Comment: (NOTE) The Xpert Xpress SARS-CoV-2/FLU/RSV plus assay is intended as an aid in the diagnosis of influenza from Nasopharyngeal swab specimens and should not be used as a sole basis for treatment. Nasal washings and aspirates are unacceptable for Xpert Xpress SARS-CoV-2/FLU/RSV testing.  Fact Sheet for Patients: EntrepreneurPulse.com.au  Fact Sheet for Healthcare Providers: IncredibleEmployment.be  This test is not yet approved or cleared by the [place] FDA and has been authorized for detection and/or diagnosis of SARS-CoV-2 by FDA under an Emergency Use Authorization (EUA). This EUA will remain in effect (meaning this test can be used) for the duration of the COVID-19 declaration under Section 564(b)(1) of the Act, 21 U.S.C. section 360bbb-3(b)(1), unless the authorization is terminated or revoked.     Resp Syncytial Virus by PCR NEGATIVE NEGATIVE Final    Comment: (NOTE) Fact Sheet for Patients: EntrepreneurPulse.com.au  Fact Sheet for Healthcare Providers: IncredibleEmployment.be  This test is not yet approved or cleared by the [place] FDA and has been authorized for detection and/or diagnosis of SARS-CoV-2 by FDA under an Emergency  Use Authorization (EUA). This EUA will remain in effect (meaning this test can be used) for the duration of the COVID-19 declaration under Section 564(b)(1) of the Act, 21 U.S.C. section 360bbb-3(b)(1), unless the authorization is terminated or revoked.  Performed at [facility], [place] [address] [place], [place] [address]     Coagulation Studies: No results for input(s): "LABPROT", "INR" in the last 72 hours.  Urinalysis: No results for input(s): "COLORURINE", "LABSPEC", "PHURINE", "GLUCOSEU", "HGBUR", "BILIRUBINUR", "KETONESUR", "PROTEINUR", "UROBILINOGEN", "NITRITE", "LEUKOCYTESUR" in the last 72 hours.  Invalid input(s): "APPERANCEUR"    Imaging: No results found.   Medications:    anticoagulant sodium citrate      ARIPiprazole  7.5 mg Oral Daily   calcitRIOL  0.25 mcg Oral Q T,Th,Sa-HD   calcium acetate  667 mg Oral TID WC   carvedilol  12.5 mg Oral BID WC   Chlorhexidine Gluconate Cloth  6 each Topical Q0600   [START ON 12/16/2022] darbepoetin (ARANESP) injection - NON-DIALYSIS  200 mcg Subcutaneous Q Wed-1800   hydrALAZINE  50 mg Oral Q8H  isosorbide mononitrate  60 mg Oral Daily   sevelamer carbonate  1,600 mg Oral TID WC   torsemide  100 mg Oral q morning   acetaminophen, alteplase, alum & mag hydroxide-simeth, anticoagulant sodium citrate, heparin, lidocaine (PF), lidocaine-prilocaine, loperamide, magnesium hydroxide, pentafluoroprop-tetrafluoroeth  Assessment/ Plan:  [name] is a 27 y.o.  female with past medical conditions including CHF, hypertension, PTSD, borderline personality disorder, and end-stage renal disease on hemodialysis, who was admitted to [facility] on 12/09/2022 for Severe recurrent major depression without psychotic features ([place]) [F33.2]   End-stage renal disease on hemodialysis. Receiving dialysis today, UF goal 2L as tolerated. Next treatment scheduled for Monday.    2. Anemia of chronic kidney disease Lab Results   Component Value Date   HGB 6.6 (L) 12/11/2022    Hgb below desired target. Will order EPO with next treatment. Will defer need of blood transfusion to primary. Will order occult stool to evaluate bleeding source.   3. Secondary Hyperparathyroidism: with outpatient labs: PTH 886, phosphorus 8.0, calcium 9.6 on 10/30/2022.    Calcium at goal. Phosphorus elevated.   4.  Hypertension with chronic kidney disease.  Patient currently prescribed carvedilol, hydralazine, isosorbide, and torsemide.  Currently receiving these medications.    LOS: 2 [place] 1/12/202412:58 PM

## 2022-12-11 NOTE — Plan of Care (Signed)
  Problem: Education: Goal: Knowledge of General Education information will improve Description: Including pain rating scale, medication(s)/side effects and non-pharmacologic comfort measures Outcome: Progressing   Problem: Nutrition: Goal: Adequate nutrition will be maintained Outcome: Progressing   Problem: Clinical Measurements: Goal: Diagnostic test results will improve Outcome: Not Progressing   Problem: Elimination: Goal: Will not experience complications related to bowel motility Outcome: Not Progressing

## 2022-12-11 NOTE — Progress Notes (Signed)
Pt denies SI/HI/AVH and verbally agrees to approach staff if these become apparent or before harming themselves/others. Rates depression 10/10. Rates anxiety 10/10. Rates pain 7/10. Pt was at dialysis for 3.5 hours. Pt did well but HGB was at 6.6. MD made aware and medical consult ordered. Pt has been complaining of diarrhea throughout the day. Pt has been in her room for most of the day after dialysis.   Scheduled medications administered to pt, per MD orders. RN provided support and encouragement to pt. Q15 min safety checks implemented and continued. Pt safe on the unit. RN will continue to monitor and intervene as needed.  12/11/22 0750  Psych Admission Type (Psych Patients Only)  Admission Status Voluntary  Psychosocial Assessment  Patient Complaints Anxiety;Depression  Eye Contact Poor  Facial Expression Anxious;Worried  Affect Anxious;Depressed  Building surveyor Activity Slow;Unsteady  Appearance/Hygiene Poor hygiene;Disheveled  Behavior Characteristics Cooperative;Anxious  Mood Depressed;Anxious  Aggressive Behavior  Effect No apparent injury  Thought Process  Coherency WDL  Content WDL  Delusions None reported or observed  Perception WDL  Hallucination None reported or observed  Judgment Limited  Confusion None  Danger to Self  Current suicidal ideation? Denies  Danger to Others  Danger to Others None reported or observed

## 2022-12-11 NOTE — Group Note (Signed)
[place] LCSW Group Therapy Note   Group Date: 12/11/2022 Start Time: 1610 End Time: 1415   Type of Therapy/Topic:  Group Therapy:  Emotion Regulation  Participation Level:  Did Not Attend   Mood:  Description of Group:    The purpose of this group is to assist patients in learning to regulate negative emotions and experience positive emotions. Patients will be guided to discuss ways in which they have been vulnerable to their negative emotions. These vulnerabilities will be juxtaposed with experiences of positive emotions or situations, and patients challenged to use positive emotions to combat negative ones. Special emphasis will be placed on coping with negative emotions in conflict situations, and patients will process healthy conflict resolution skills.  Therapeutic Goals: Patient will identify two positive emotions or experiences to reflect on in order to balance out negative emotions:  Patient will label two or more emotions that they find the most difficult to experience:  Patient will be able to demonstrate positive conflict resolution skills through discussion or role plays:   Summary of Patient Progress:   Patient declined to attend group, despite encouragement from this CSW.    Therapeutic Modalities:   Cognitive Behavioral Therapy Feelings Identification Dialectical Behavioral Therapy   Rozann Lesches, LCSW

## 2022-12-11 NOTE — Progress Notes (Signed)
[facility]  12/11/2022 2:03 PM Sapna Padron  MRN:  [id number] Subjective: Follow-up for patient with severe depression major social problems and multiple medical issues.  Patient had dialysis this morning.  Seen this afternoon she is fatigued but awake and alert and cooperative.  Continues to complain of pain in her feet, depressed mood, passive suicidal ideation with hopelessness. Principal Problem: Severe recurrent major depression without psychotic features ([place]) Diagnosis: Principal Problem:   Severe recurrent major depression without psychotic features ([place]) Active Problems:   PTSD (post-traumatic stress disorder)   HTN (hypertension)   Borderline personality disorder (HCC)   Moderate cannabis use disorder (HCC)   Cocaine use, 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HCC)   Suicidal ideation   ESRD (end stage renal disease) on dialysis ([place])   Homelessness  Total Time spent with patient: 30 minutes  Past Psychiatric History: Patient has a longstanding history of chronic depression and anxiety with suicidal ideation and several prior suicide attempts substance use problems and extreme social problems as well as multiple medical issues  Past Medical History:  Past Medical History:  Diagnosis Date   Asthma    as a child, no problem as an adult, no inhaler   CHF (congestive heart failure) (HCC)    Complication of anesthesia    woke up before tube removed, 1 time fought nurses   ESRD on hemodialysis ([place])    M-W-F   History of borderline personality disorder    Hypertension    diagnosed as child; stopped meds at 82 yo   Insomnia    Neuromuscular disorder ([place])    peripheral neuropathy   Nonspecific chest pain    PTSD (post-traumatic stress disorder)     Past Surgical History:  Procedure Laterality Date   AV FISTULA PLACEMENT Left 10/18/2020   Procedure: LEFT ARM ARTERIOVENOUS (AV) FISTULA CREATION;  Surgeon: Serafina Mitchell, MD;  [facility];   Service: Vascular;  Laterality: Left;   CHOLECYSTECTOMY     extraction of wisdom teeth     FISTULA SUPERFICIALIZATION Left 02/13/2021   Procedure: LEFT BRACHIOCEPHALIC ARTERIOVENOUS FISTULA SUPERFICIALIZATION;  Surgeon: Serafina Mitchell, MD;  Location: [place];  Service: Vascular;  Laterality: Left;   I & D EXTREMITY Left 01/30/2022   Procedure: IRRIGATION AND DEBRIDEMENT EXTREMITY;  Surgeon: Milly Jakob, MD;  Location: [place];  Service: Orthopedics;  Laterality: Left;   INCISION AND DRAINAGE OF WOUND Left 01/30/2022   Procedure: LEFT WRIST ASPIRATION;  Surgeon: Vanetta Mulders, MD;  Location: [place];  Service: Orthopedics;  Laterality: Left;   KNEE ARTHROSCOPY Right 01/30/2022   Procedure: ARTHROSCOPY KNEE AND IRRIIGATION AND DEBRIDMENT; LEFT WRIST ASPIRATION;  Surgeon: Vanetta Mulders, MD;  Location: [place];  Service: Orthopedics;  Laterality: Right;   RENAL BIOPSY     x 2   TUNNELED VENOUS CATHETER PLACEMENT  02/11/2021   [facility]   Family History:  Family History  Adopted: Yes  Problem Relation Age of Onset   Diabetes Other    Hypertension Other    Family Psychiatric  History: See previous Social History:  Social History   Substance and Sexual Activity  Alcohol Use Not Currently   Comment: "maybe 3 a month."- liquor     Social History   Substance and Sexual Activity  Drug Use Yes   Types: Marijuana    Social History   Socioeconomic History   Marital status: Soil scientist    Spouse name: Not on file   Number of children:  1   Years of education: Not on file   Highest education level: 12th grade  Occupational History   Occupation: unemployed  Tobacco Use   Smoking status: Every Day    Packs/day: 0.50    Years: 10.00    Total pack years: 5.00    Types: Cigarettes   Smokeless tobacco: Never  Vaping Use   Vaping Use: Never used  Substance and Sexual Activity   Alcohol use: Not Currently    Comment: "maybe 3 a month."- liquor   Drug use: Yes    Types:  Marijuana   Sexual activity: Not Currently    Birth control/protection: None  Other Topics Concern   Not on file  Social History Narrative   Not on file   Social Determinants of Health   Financial Resource Strain: Unknown (01/13/2019)   Overall Financial Resource Strain (CARDIA)    Difficulty of Paying Living Expenses: Patient refused  Food Insecurity: Food Insecurity Present (12/09/2022)   Hunger Vital Sign    Worried About Running Out of Food in the Last Year: Often true    Ran Out of Food in the Last Year: Often true  Transportation Needs: Unmet Transportation Needs (12/09/2022)   PRAPARE - Transportation    Lack of Transportation (Medical): Yes    Lack of Transportation (Non-Medical): Yes  Physical Activity: Unknown (01/13/2019)   Exercise Vital Sign    Days of Exercise per Week: Patient refused    Minutes of Exercise per Session: Patient refused  Stress: Unknown (01/13/2019)   [employer] of Stress : Patient refused  Social Connections: Unknown (01/13/2019)   Social Connection and Isolation Panel [NHANES]    Frequency of Communication with Friends and Family: Patient refused    Frequency of Social Gatherings with Friends and Family: Patient refused    Attends Religious Services: Patient refused    Marine scientist or Organizations: Patient refused    Attends Archivist Meetings: Patient refused    Marital Status: Patient refused   Additional Social History:                         Sleep: Fair  Appetite:  Fair  Current Medications: Current Facility-Administered Medications  Medication Dose Route Frequency Provider Last Rate Last Admin   acetaminophen (TYLENOL) tablet 650 mg  650 mg Oral Q6H PRN Reshanda Lewey T, MD   650 mg at 12/11/22 0750   alteplase (CATHFLO ACTIVASE) injection 2 mg  2 mg Intracatheter Once PRN Colon Flattery, NP       alum & mag hydroxide-simeth  (MAALOX/MYLANTA) 200-200-20 MG/5ML suspension 30 mL  30 mL Oral Q4H PRN Chaundra Abreu T, MD       anticoagulant sodium citrate solution 5 mL  5 mL Intracatheter PRN Colon Flattery, NP       ARIPiprazole (ABILIFY) tablet 7.5 mg  7.5 mg Oral Daily Elgar Scoggins T, MD   7.5 mg at 12/11/22 0751   calcitRIOL (ROCALTROL) capsule 0.25 mcg  0.25 mcg Oral Q T,Th,Sa-HD Kenny Rea T, MD   0.25 mcg at 12/10/22 1118   calcium acetate (PHOSLO) capsule 667 mg  667 mg Oral TID WC Khyson Sebesta T, MD   667 mg at 12/11/22 1335   carvedilol (COREG) tablet 12.5 mg  12.5 mg Oral BID WC Jennel Mara, Madie Reno, MD       Chlorhexidine Gluconate Cloth 2 % PADS 6  each  6 each Topical Q0600 Colon Flattery, NP       [START ON 12/16/2022] Darbepoetin Alfa (ARANESP) injection 200 mcg  200 mcg Subcutaneous Q Wed-1800 Bence Trapp T, MD       DULoxetine (CYMBALTA) DR capsule 20 mg  20 mg Oral Daily Giselle Brutus T, MD       gabapentin (NEURONTIN) capsule 100 mg  100 mg Oral BID Ossiel Marchio, Madie Reno, MD       heparin injection 1,000 Units  1,000 Units Intracatheter PRN Colon Flattery, NP       hydrALAZINE (APRESOLINE) tablet 50 mg  50 mg Oral Q8H Laquandra Carrillo T, MD   50 mg at 12/11/22 1334   isosorbide mononitrate (IMDUR) 24 hr tablet 60 mg  60 mg Oral Daily Chikita Dogan T, MD   60 mg at 12/11/22 0800   lidocaine (PF) (XYLOCAINE) 1 % injection 5 mL  5 mL Intradermal PRN Colon Flattery, NP       lidocaine-prilocaine (EMLA) cream 1 Application  1 Application Topical PRN Colon Flattery, NP       loperamide (IMODIUM) capsule 2 mg  2 mg Oral PRN Efrem Pitstick, Madie Reno, MD   2 mg at 12/11/22 1336   magnesium hydroxide (MILK OF MAGNESIA) suspension 30 mL  30 mL Oral Daily PRN Norvella Loscalzo, Madie Reno, MD       pentafluoroprop-tetrafluoroeth (GEBAUERS) aerosol 1 Application  1 Application Topical PRN Colon Flattery, NP   1 Application at 11/94/17 0840   sevelamer carbonate (RENVELA) tablet 1,600 mg  1,600 mg Oral TID WC Tihanna Goodson T, MD    1,600 mg at 12/11/22 1335   torsemide (DEMADEX) tablet 100 mg  100 mg Oral q morning Annet Manukyan, Madie Reno, MD        Lab Results:  Results for orders placed or performed during the hospital encounter of 12/09/22 (from the past 48 hour(s))  Renal function panel     Status: Abnormal   Collection Time: 12/11/22  8:59 AM  Result Value Ref Range   Sodium 136 135 - 145 mmol/L   Potassium 4.8 3.5 - 5.1 mmol/L   Chloride 101 98 - 111 mmol/L   CO2 21 (L) 22 - 32 mmol/L   Glucose, Bld 129 (H) 70 - 99 mg/dL    Comment: Glucose reference range applies only to samples taken after fasting for at least 8 hours.   BUN 93 (H) 6 - 20 mg/dL    Comment: RESULT CONFIRMED BY MANUAL DILUTION.PMF   Creatinine, Ser 7.61 (H) 0.44 - 1.00 mg/dL   Calcium 9.2 8.9 - 10.3 mg/dL   Phosphorus 6.1 (H) 2.5 - 4.6 mg/dL   Albumin 2.6 (L) 3.5 - 5.0 g/dL   GFR, Estimated 7 (L) >60 mL/min    Comment: (NOTE) Calculated using the CKD-EPI Creatinine Equation (2021)    Anion gap 14 5 - 15    Comment: Performed at [facility], [address] [place], [place] [address]  CBC     Status: Abnormal   Collection Time: 12/11/22  8:59 AM  Result Value Ref Range   WBC 6.8 4.0 - 10.5 K/uL   RBC 2.32 (L) 3.87 - 5.11 MIL/uL   Hemoglobin 6.6 (L) 12.0 - 15.0 g/dL   HCT 22.2 (L) 36.0 - 46.0 %   MCV 95.7 80.0 - 100.0 fL   MCH 28.4 26.0 - 34.0 pg   MCHC 29.7 (L) 30.0 - 36.0 g/dL   RDW 19.8 (H) 11.5 - 15.5 %  Platelets 124 (L) 150 - 400 K/uL   nRBC 0.0 0.0 - 0.2 %    Comment: Performed at [facility], [address] [place], [place] [address]    Blood Alcohol level:  Lab Results  Component Value Date   [facility] <10 12/08/2022   ETH <10 24/58/0998    Metabolic Disorder Labs: Lab Results  Component Value Date   HGBA1C 5.4 12/24/2021   MPG 108 12/24/2021   MPG 105.41 01/16/2021   No results found for: "PROLACTIN" Lab Results  Component Value Date   CHOL 109 10/04/2022   TRIG 66 10/04/2022   HDL 54  10/04/2022   CHOLHDL 2.0 10/04/2022   VLDL 13 10/04/2022   LDLCALC 42 10/04/2022   LDLCALC 54 12/24/2021    Physical Findings: AIMS: Facial and Oral Movements Muscles of Facial Expression: None, normal Lips and Perioral Area: None, normal Jaw: None, normal Tongue: None, normal,Extremity Movements Upper (arms, wrists, hands, fingers): None, normal Lower (legs, knees, ankles, toes): None, normal, Trunk Movements Neck, shoulders, hips: None, normal, Overall Severity Severity of abnormal movements (highest score from questions above): None, normal Incapacitation due to abnormal movements: None, normal Patient's awareness of abnormal movements (rate only patient's report): No Awareness, Dental Status Current problems with teeth and/or dentures?: No Does patient usually wear dentures?: No  CIWA:    COWS:     Musculoskeletal: Strength & Muscle Tone: within normal limits Gait & Station: normal Patient leans: N/A  Psychiatric Specialty Exam:  Presentation  General Appearance:  Casual  Eye Contact: Fair  Speech: Clear and Coherent  Speech Volume: Normal  Handedness: Right   Mood and Affect  Mood: Depressed; Anxious  Affect: Congruent; Depressed   Thought Process  Thought Processes: Coherent  Descriptions of Associations:Intact  Orientation:Full (Time, Place and Person)  Thought Content:Illogical  History of Schizophrenia/Schizoaffective disorder:No  Duration of Psychotic Symptoms:No data recorded Hallucinations:No data recorded Ideas of Reference:None  Suicidal Thoughts:No data recorded Homicidal Thoughts:No data recorded  Sensorium  Memory: Immediate Good; Recent Good; Remote Good  Judgment: -- (impulsive)  Insight: Lacking   Executive Functions  Concentration: Fair  Attention Span: Fair  Recall: Central of Knowledge: Good  Language: Good   Psychomotor Activity  Psychomotor Activity:No data recorded  Assets   Assets: Communication Skills; Desire for Improvement; Financial Resources/Insurance   Sleep  Sleep:No data recorded   Physical Exam: Physical Exam Vitals and nursing note reviewed.  Constitutional:      Appearance: Normal appearance.  HENT:     Head: Normocephalic and atraumatic.     Mouth/Throat:     Pharynx: Oropharynx is clear.  Eyes:     Pupils: Pupils are equal, round, and reactive to light.  Cardiovascular:     Rate and Rhythm: Normal rate and regular rhythm.  Pulmonary:     Effort: Pulmonary effort is normal.     Breath sounds: Normal breath sounds.  Abdominal:     General: Abdomen is flat.     Palpations: Abdomen is soft.  Musculoskeletal:        General: Normal range of motion.  Skin:    General: Skin is warm and dry.  Neurological:     General: No focal deficit present.     Mental Status: She is alert. Mental status is at baseline.     Sensory: Sensory deficit present.     Motor: Weakness present.  Psychiatric:        Attention and Perception: Attention normal.  Mood and Affect: Mood is depressed. Affect is blunt.        Speech: Speech is delayed.        Behavior: Behavior is slowed.        Thought Content: Thought content includes suicidal ideation. Thought content does not include suicidal plan.    Review of Systems  Constitutional: Negative.   HENT: Negative.    Eyes: Negative.   Respiratory: Negative.    Cardiovascular: Negative.   Gastrointestinal: Negative.   Musculoskeletal: Negative.   Skin: Negative.   Neurological:  Positive for sensory change and weakness.  Psychiatric/Behavioral:  Positive for depression and suicidal ideas. Negative for hallucinations and substance abuse. The patient is nervous/anxious.    Blood pressure (!) 136/90, pulse (!) 110, temperature 97.7 F (36.5 C), temperature source Oral, resp. rate (!) 27, height 5\' 8"  (1.727 m), weight 90.7 kg, SpO2 91 %. Body mass index is 30.4 kg/m.   Treatment Plan  Summary: Medication management and Plan patient also has a hemoglobin dropping down with a measurement of 6.6 today.  Gradually decreased over the last several dialysis sessions.  This of course could be 1 of several contributors to her fatigue.  Hospitalist consult requested for her multiple medical issues and is currently in progress.  For pain I suggested adding gabapentin with extremely low doses due to dialysis starting with 100 mg twice a day.  If this is over sedating we may need to cut back further.  Also restarting Cymbalta which she has been on in the past which may be of benefit both for pain and depression.  Will follow-up with medical issues.  Treatment team aware of the difficulty in terms of placement and discharge planning  Alethia Berthold, MD 12/11/2022, 2:03 PM

## 2022-12-11 NOTE — [facility] Suicide Risk Assessment (Signed)
[facility] Discharge Suicide Risk Assessment   Principal Problem: Severe recurrent major depression without psychotic features [facility]) Discharge Diagnoses: Principal Problem:   Severe recurrent major depression without psychotic features ([place]) Active Problems:   PTSD (post-traumatic stress disorder)   HTN (hypertension)   Borderline personality disorder (HCC)   Moderate cannabis use disorder (HCC)   Cocaine use, 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[place])   Suicidal ideation   ESRD (end stage renal disease) on dialysis ([place])   Homelessness   Total Time spent with patient: 30 minutes  Musculoskeletal: Strength & Muscle Tone: within normal limits Gait & Station: unable to stand Patient leans: N/A  Psychiatric Specialty Exam  Presentation  General Appearance:  Casual  Eye Contact: Fair  Speech: Clear and Coherent  Speech Volume: Normal  Handedness: Right   Mood and Affect  Mood: Depressed; Anxious  Duration of Depression Symptoms: Greater than two weeks  Affect: Congruent; Depressed   Thought Process  Thought Processes: Coherent  Descriptions of Associations:Intact  Orientation:Full (Time, Place and Person)  Thought Content:Illogical  History of Schizophrenia/Schizoaffective disorder:No  Duration of Psychotic Symptoms:No data recorded Hallucinations:No data recorded Ideas of Reference:None  Suicidal Thoughts:No data recorded Homicidal Thoughts:No data recorded  Sensorium  Memory: Immediate Good; Recent Good; Remote Good  Judgment: -- (impulsive)  Insight: Lacking   Executive Functions  Concentration: Fair  Attention Span: Fair  Recall: [place] of Knowledge: Good  Language: Good   Psychomotor Activity  Psychomotor Activity:No data recorded  Assets  Assets: Communication Skills; Desire for Improvement; Financial Resources/Insurance   Sleep  Sleep:No data recorded  Physical Exam: Physical Exam Vitals and  nursing note reviewed.  Constitutional:      Appearance: Normal appearance.  HENT:     Head: Normocephalic and atraumatic.     Mouth/Throat:     Pharynx: Oropharynx is clear.  Eyes:     Pupils: Pupils are equal, round, and reactive to light.  Cardiovascular:     Rate and Rhythm: Normal rate and regular rhythm.  Pulmonary:     Effort: Pulmonary effort is normal.     Breath sounds: Normal breath sounds.  Abdominal:     General: Abdomen is flat.     Palpations: Abdomen is soft.  Musculoskeletal:        General: Normal range of motion.  Skin:    General: Skin is warm and dry.  Neurological:     General: No focal deficit present.     Mental Status: She is alert. Mental status is at baseline.  Psychiatric:        Attention and Perception: Attention normal.        Mood and Affect: Mood normal. Affect is blunt.        Speech: Speech is delayed.        Behavior: Behavior is slowed.        Thought Content: Thought content normal.        Cognition and Memory: Cognition normal.    Review of Systems  Constitutional:  Positive for malaise/fatigue.  HENT: Negative.    Eyes: Negative.   Respiratory: Negative.    Cardiovascular: Negative.   Gastrointestinal: Negative.   Musculoskeletal: Negative.   Skin: Negative.   Neurological:  Positive for sensory change and focal weakness.  Psychiatric/Behavioral:  Positive for depression. Negative for suicidal ideas. The patient is nervous/anxious.    Blood pressure (!) 136/90, pulse (!) 110, temperature 97.7 F (36.5 C), temperature source Oral, resp. rate (!) 27, height 5\' 8"  (  1.727 m), weight 90.7 kg, SpO2 91 %. Body mass index is 30.4 kg/m.  Mental Status Per Nursing Assessment::   On Admission:  Self-harm thoughts  Demographic Factors:  Low socioeconomic status  Loss Factors: NA  Historical Factors: Prior suicide attempts  Risk Reduction Factors:   NA  Continued Clinical Symptoms:  Depression:   Anhedonia  Cognitive  Features That Contribute To Risk:  Closed-mindedness    Suicide Risk:  Mild:  Suicidal ideation of limited frequency, intensity, duration, and specificity.  There are no identifiable plans, no associated intent, mild dysphoria and related symptoms, good self-control (both objective and subjective assessment), few other risk factors, and identifiable protective factors, including available and accessible social support.    Plan Of Care/Follow-up recommendations:  Other:  discharge to be admitted to medical floor. Recommend continue sitter  Alethia Berthold, MD 12/11/2022, 6:22 PM

## 2022-12-11 NOTE — BH IP Treatment Plan (Signed)
Interdisciplinary Treatment and Diagnostic Plan Update  12/11/2022 Time of Session: 9:30AM Natasha Long MRN: [id number]  Principal Diagnosis: Severe recurrent major depression without psychotic features ([place])  Secondary Diagnoses: Principal Problem:   Severe recurrent major depression without psychotic features ([place]) Active Problems:   PTSD (post-traumatic stress disorder)   HTN (hypertension)   Borderline personality disorder ([place])   Moderate cannabis use disorder ([place])   Cocaine use, 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[place])   Suicidal ideation   ESRD (end stage renal disease) on dialysis ([place])   Homelessness   Current Medications:  Current Facility-Administered Medications  Medication Dose Route Frequency Provider Last Rate Last Admin   acetaminophen (TYLENOL) tablet 650 mg  650 mg Oral Q6H PRN Clapacs, John T, MD   650 mg at 12/11/22 0750   alteplase (CATHFLO ACTIVASE) injection 2 mg  2 mg Intracatheter Once PRN Colon Flattery, NP       alum & mag hydroxide-simeth (MAALOX/MYLANTA) 200-200-20 MG/5ML suspension 30 mL  30 mL Oral Q4H PRN Clapacs, John T, MD       anticoagulant sodium citrate solution 5 mL  5 mL Intracatheter PRN Colon Flattery, NP       ARIPiprazole (ABILIFY) tablet 7.5 mg  7.5 mg Oral Daily Clapacs, John T, MD   7.5 mg at 12/11/22 0751   calcitRIOL (ROCALTROL) capsule 0.25 mcg  0.25 mcg Oral Q T,Th,Sa-HD Clapacs, John T, MD   0.25 mcg at 12/10/22 1118   calcium acetate (PHOSLO) capsule 667 mg  667 mg Oral TID WC Clapacs, John T, MD   667 mg at 12/11/22 0801   carvedilol (COREG) tablet 12.5 mg  12.5 mg Oral BID WC Clapacs, Madie Reno, MD       Chlorhexidine Gluconate Cloth 2 % PADS 6 each  6 each Topical Q0600 Colon Flattery, NP       [START ON 12/16/2022] Darbepoetin Alfa (ARANESP) injection 200 mcg  200 mcg Subcutaneous Q Wed-1800 Clapacs, John T, MD       heparin injection 1,000 Units  1,000 Units Intracatheter PRN Colon Flattery, NP        hydrALAZINE (APRESOLINE) tablet 50 mg  50 mg Oral Q8H Clapacs, John T, MD   50 mg at 12/10/22 2118   isosorbide mononitrate (IMDUR) 24 hr tablet 60 mg  60 mg Oral Daily Clapacs, John T, MD   60 mg at 12/11/22 0800   lidocaine (PF) (XYLOCAINE) 1 % injection 5 mL  5 mL Intradermal PRN Colon Flattery, NP       lidocaine-prilocaine (EMLA) cream 1 Application  1 Application Topical PRN Colon Flattery, NP       loperamide (IMODIUM) capsule 2 mg  2 mg Oral PRN Clapacs, Madie Reno, MD   2 mg at 12/11/22 0751   magnesium hydroxide (MILK OF MAGNESIA) suspension 30 mL  30 mL Oral Daily PRN Clapacs, Madie Reno, MD       pentafluoroprop-tetrafluoroeth (GEBAUERS) aerosol 1 Application  1 Application Topical PRN Colon Flattery, NP   1 Application at 95/62/13 0840   sevelamer carbonate (RENVELA) tablet 1,600 mg  1,600 mg Oral TID WC Clapacs, John T, MD   1,600 mg at 12/10/22 1738   torsemide (DEMADEX) tablet 100 mg  100 mg Oral q morning Clapacs, Madie Reno, MD       PTA Medications: Medications Prior to Admission  Medication Sig Dispense Refill Last Dose   ARIPiprazole (ABILIFY) 5 MG tablet Take 1.5 tablets (7.5 mg total) by mouth daily. 45 tablet  1    calcitRIOL (ROCALTROL) 0.25 MCG capsule Take 1 capsule (0.25 mcg total) by mouth Every Tuesday,Thursday,and Saturday with dialysis. 30 capsule 0    calcium acetate (PHOSLO) 667 MG capsule Take 1 capsule (667 mg total) by mouth 3 (three) times daily with meals. 90 capsule 3    carvedilol (COREG) 6.25 MG tablet Take 1 tablet (6.25 mg total) by mouth 2 (two) times daily with a meal. 60 tablet 0    hydrALAZINE (APRESOLINE) 50 MG tablet Take 1 tablet (50 mg total) by mouth every 8 (eight) hours. 90 tablet 0    isosorbide mononitrate (IMDUR) 60 MG 24 hr tablet Take 1 tablet (60 mg total) by mouth daily. 30 tablet 0    sevelamer carbonate (RENVELA) 800 MG tablet Take 2 tablets (1,600 mg total) by mouth 3 (three) times daily with meals. 180 tablet 0    torsemide (DEMADEX)  100 MG tablet Take 1 tablet (100 mg total) by mouth every morning. 30 tablet 3     Patient Stressors: Health problems   Marital or family conflict    Patient Strengths: Ability for insight  Motivation for treatment/growth   Treatment Modalities: Medication Management, Group therapy, Case management,  1 to 1 session with clinician, Psychoeducation, Recreational therapy.   Physician Treatment Plan for Primary Diagnosis: Severe recurrent major depression without psychotic features ([place]) Long Term Goal(s): Improvement in symptoms so as ready for discharge   Short Term Goals: Ability to identify and develop effective coping behaviors will improve Ability to maintain clinical measurements within normal limits will improve Ability to verbalize feelings will improve Ability to disclose and discuss suicidal ideas Ability to demonstrate self-control will improve  Medication Management: Evaluate patient's response, side effects, and tolerance of medication regimen.  Therapeutic Interventions: 1 to 1 sessions, Unit Group sessions and Medication administration.  Evaluation of Outcomes: Not Met  Physician Treatment Plan for Secondary Diagnosis: Principal Problem:   Severe recurrent major depression without psychotic features ([place]) Active Problems:   PTSD (post-traumatic stress disorder)   HTN (hypertension)   Borderline personality disorder ([facility])   Moderate cannabis use disorder ([facility])   Cocaine use, 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[facility])   Suicidal ideation   ESRD (end stage renal disease) on dialysis ([place])   Homelessness  Long Term Goal(s): Improvement in symptoms so as ready for discharge   Short Term Goals: Ability to identify and develop effective coping behaviors will improve Ability to maintain clinical measurements within normal limits will improve Ability to verbalize feelings will improve Ability to disclose and discuss suicidal ideas Ability to demonstrate  self-control will improve     Medication Management: Evaluate patient's response, side effects, and tolerance of medication regimen.  Therapeutic Interventions: 1 to 1 sessions, Unit Group sessions and Medication administration.  Evaluation of Outcomes: Not Met   RN Treatment Plan for Primary Diagnosis: Severe recurrent major depression without psychotic features ([place]) Long Term Goal(s): Knowledge of disease and therapeutic regimen to maintain health will improve  Short Term Goals: Ability to demonstrate self-control, Ability to participate in decision making will improve, Ability to verbalize feelings will improve, Ability to disclose and discuss suicidal ideas, Ability to identify and develop effective coping behaviors will improve, and Compliance with prescribed medications will improve  Medication Management: RN will administer medications as ordered by provider, will assess and evaluate patient's response and provide education to patient for prescribed medication. RN will report any adverse and/or side effects to prescribing provider.  Therapeutic Interventions: 1 on  1 counseling sessions, Psychoeducation, Medication administration, Evaluate responses to treatment, Monitor vital signs and CBGs as ordered, Perform/monitor CIWA, COWS, AIMS and Fall Risk screenings as ordered, Perform wound care treatments as ordered.  Evaluation of Outcomes: Not Met   LCSW Treatment Plan for Primary Diagnosis: Severe recurrent major depression without psychotic features ([place]) Long Term Goal(s): Safe transition to appropriate next level of care at discharge, Engage patient in therapeutic group addressing interpersonal concerns.  Short Term Goals: Engage patient in aftercare planning with referrals and resources, Increase social support, Increase ability to appropriately verbalize feelings, Increase emotional regulation, Facilitate acceptance of mental health diagnosis and concerns, and Increase skills for  wellness and recovery  Therapeutic Interventions: Assess for all discharge needs, 1 to 1 time with Social worker, Explore available resources and support systems, Assess for adequacy in community support network, Educate family and significant other(s) on suicide prevention, Complete Psychosocial Assessment, Interpersonal group therapy.  Evaluation of Outcomes: Not Met   Progress in Treatment: Attending groups: No. Participating in groups: No. Taking medication as prescribed: Yes. Toleration medication: Yes. Family/Significant other contact made: No, will contact:  once permission is given Patient understands diagnosis: Yes. Discussing patient identified problems/goals with staff: No. Medical problems stabilized or resolved: Yes. Denies suicidal/homicidal ideation: Yes. Issues/concerns per patient self-inventory: No. Other: none  New problem(s) identified: No, Describe:  none  New Short Term/Long Term Goal(s): detox, elimination of symptoms of psychosis, medication management for mood stabilization; elimination of SI thoughts; development of comprehensive mental wellness/sobriety plan.   Patient Goals:  Patient unable to attend treatment team due to being in dialysis.  Discharge Plan or Barriers: CSW to assist patient in development of appropriate discharge plans.   Reason for Continuation of Hospitalization: Anxiety Depression Medication stabilization Suicidal ideation  Estimated Length of Stay: 1-7 days  Last 3 [place] Suicide Severity Risk Score: Flowsheet Row Admission (Current) from 12/09/2022 in [facility] from 12/08/2022 in [facility] to Hosp-Admission (Discharged) from 12/04/2022 in [facility] Low Risk High Risk No Risk       Last PHQ 2/9 Scores:    08/11/2021    3:14 PM 12/30/2020   12:27 PM 12/27/2020    1:33 PM  Depression screen PHQ 2/9   Decreased Interest 3 3 3   Down, Depressed, Hopeless 3 3 3   PHQ - 2 Score 6 6 6   Altered sleeping 2 3 3   Tired, decreased energy 3 3 3   Change in appetite 1 2 3   Feeling bad or failure about yourself  3 3 3   Trouble concentrating 3 3 3   Moving slowly or fidgety/restless 0 0 1  Suicidal thoughts 3 2 2   PHQ-9 Score 21 22 24     Scribe for Treatment Team: Rozann Lesches, LCSW 12/11/2022 9:54 AM

## 2022-12-11 NOTE — Progress Notes (Signed)
Recreation Therapy Notes  INPATIENT RECREATION TR PLAN  Patient Details Name: Natasha Long MRN: [id number] DOB: 23-Apr-1996 Today's Date: 12/11/2022  Rec Therapy Plan Is patient appropriate for Therapeutic Recreation?: Yes Treatment times per week: at least 3 Estimated Length of Stay: 5-7 days TR Treatment/Interventions: Group participation (Comment)  Discharge Criteria Pt will be discharged from therapy if:: Discharged Treatment plan/goals/alternatives discussed and agreed upon by:: Patient/family  Discharge Summary     Nilay Mangrum 12/11/2022, 3:12 PM

## 2022-12-11 NOTE — Progress Notes (Signed)
MHT informed this Probation officer that patient refused vitals.

## 2022-12-11 NOTE — Progress Notes (Signed)
Received patient in bed to unit.  Alert. Informed consent signed and in chart.   Treatment initiated: 3005 Treatment completed: 1234  Patient tolerated well.  Transported back to the room  Alert, without acute distress.  Hand-off given to patient's nurse.   Access used: Left AVF Access issues: none  Total UF removed: 2L Medication(s) given: none  Post HD weight: 90.7 kg   Natasha Long Natasha Long [facility]   12/11/22 1234  Vitals  Temp 97.7 F (36.5 C)  Temp Source Oral  BP (!) 136/90  MAP (mmHg) 103  BP Location Right Wrist  BP Method Automatic  Patient Position (if appropriate) Lying  Pulse Rate (!) 110  Pulse Rate Source Monitor  ECG Heart Rate (!) 108  Resp (!) 27  Oxygen Therapy  SpO2 91 %  O2 Device Room Air  Patient Activity (if Appropriate) In chair  Pulse Oximetry Type Continuous  During Treatment Monitoring  HD Safety Checks Performed Yes  Intra-Hemodialysis Comments Tolerated well;Tx completed  Post Treatment  Dialyzer Clearance Lightly streaked  Duration of HD Treatment -hour(s) 3.5 hour(s)  Hemodialysis Intake (mL) 0 mL  Liters Processed 73.6  Fluid Removed (mL) 2000 mL  Tolerated HD Treatment Yes  AVG/AVF Arterial Site Held (minutes) 7 minutes  AVG/AVF Venous Site Held (minutes) 7 minutes  Fistula / Graft Left Upper arm  No placement date or time found.   Orientation: Left  Access Location: Upper arm  Site Condition No complications  Fistula / Graft Assessment Present;[place];Bruit  Status Deaccessed  Drainage Description None

## 2022-12-12 ENCOUNTER — Other Ambulatory Visit: Payer: Self-pay

## 2022-12-12 DIAGNOSIS — E669 Obesity, unspecified: Secondary | ICD-10-CM | POA: Diagnosis present

## 2022-12-12 DIAGNOSIS — F32A Depression, unspecified: Secondary | ICD-10-CM | POA: Diagnosis present

## 2022-12-12 DIAGNOSIS — I5022 Chronic systolic (congestive) heart failure: Secondary | ICD-10-CM | POA: Diagnosis present

## 2022-12-12 DIAGNOSIS — M25521 Pain in right elbow: Secondary | ICD-10-CM | POA: Diagnosis present

## 2022-12-12 DIAGNOSIS — F17210 Nicotine dependence, cigarettes, uncomplicated: Secondary | ICD-10-CM | POA: Diagnosis present

## 2022-12-12 DIAGNOSIS — R45851 Suicidal ideations: Secondary | ICD-10-CM | POA: Diagnosis present

## 2022-12-12 DIAGNOSIS — G4700 Insomnia, unspecified: Secondary | ICD-10-CM | POA: Diagnosis present

## 2022-12-12 DIAGNOSIS — G629 Polyneuropathy, unspecified: Secondary | ICD-10-CM | POA: Diagnosis present

## 2022-12-12 DIAGNOSIS — F603 Borderline personality disorder: Secondary | ICD-10-CM | POA: Diagnosis present

## 2022-12-12 DIAGNOSIS — N186 End stage renal disease: Secondary | ICD-10-CM

## 2022-12-12 DIAGNOSIS — I5020 Unspecified systolic (congestive) heart failure: Secondary | ICD-10-CM

## 2022-12-12 DIAGNOSIS — R55 Syncope and collapse: Secondary | ICD-10-CM

## 2022-12-12 DIAGNOSIS — D649 Anemia, unspecified: Secondary | ICD-10-CM

## 2022-12-12 DIAGNOSIS — F4310 Post-traumatic stress disorder, unspecified: Secondary | ICD-10-CM | POA: Diagnosis present

## 2022-12-12 DIAGNOSIS — R42 Dizziness and giddiness: Secondary | ICD-10-CM

## 2022-12-12 DIAGNOSIS — M545 Low back pain, unspecified: Secondary | ICD-10-CM | POA: Diagnosis present

## 2022-12-12 DIAGNOSIS — Z91158 Patient's noncompliance with renal dialysis for other reason: Secondary | ICD-10-CM | POA: Diagnosis not present

## 2022-12-12 DIAGNOSIS — I10 Essential (primary) hypertension: Secondary | ICD-10-CM

## 2022-12-12 DIAGNOSIS — G8929 Other chronic pain: Secondary | ICD-10-CM | POA: Diagnosis present

## 2022-12-12 DIAGNOSIS — Z79899 Other long term (current) drug therapy: Secondary | ICD-10-CM | POA: Diagnosis not present

## 2022-12-12 DIAGNOSIS — Z992 Dependence on renal dialysis: Secondary | ICD-10-CM

## 2022-12-12 DIAGNOSIS — R197 Diarrhea, unspecified: Secondary | ICD-10-CM | POA: Diagnosis present

## 2022-12-12 DIAGNOSIS — I132 Hypertensive heart and chronic kidney disease with heart failure and with stage 5 chronic kidney disease, or end stage renal disease: Secondary | ICD-10-CM | POA: Diagnosis present

## 2022-12-12 DIAGNOSIS — M25522 Pain in left elbow: Secondary | ICD-10-CM | POA: Diagnosis present

## 2022-12-12 DIAGNOSIS — J45909 Unspecified asthma, uncomplicated: Secondary | ICD-10-CM | POA: Diagnosis present

## 2022-12-12 DIAGNOSIS — D631 Anemia in chronic kidney disease: Secondary | ICD-10-CM | POA: Diagnosis present

## 2022-12-12 DIAGNOSIS — N2581 Secondary hyperparathyroidism of renal origin: Secondary | ICD-10-CM | POA: Diagnosis present

## 2022-12-12 LAB — CBC
HCT: 29 % — ABNORMAL LOW (ref 36.0–46.0)
HCT: 28.6 % — ABNORMAL LOW (ref 36.0–46.0)
Hemoglobin: 9 g/dL — ABNORMAL LOW (ref 12.0–15.0)
Hemoglobin: 8.9 g/dL — ABNORMAL LOW (ref 12.0–15.0)
MCH: 28.8 pg (ref 26.0–34.0)
MCH: 29.1 pg (ref 26.0–34.0)
MCHC: 31 g/dL (ref 30.0–36.0)
MCHC: 31.1 g/dL (ref 30.0–36.0)
MCV: 92.7 fL (ref 80.0–100.0)
MCV: 93.5 fL (ref 80.0–100.0)
Platelets: 133 10*3/uL — ABNORMAL LOW (ref 150–400)
Platelets: 134 10*3/uL — ABNORMAL LOW (ref 150–400)
RBC: 3.13 MIL/uL — ABNORMAL LOW (ref 3.87–5.11)
RBC: 3.06 MIL/uL — ABNORMAL LOW (ref 3.87–5.11)
RDW: 18.9 % — ABNORMAL HIGH (ref 11.5–15.5)
RDW: 19 % — ABNORMAL HIGH (ref 11.5–15.5)
WBC: 6.5 10*3/uL (ref 4.0–10.5)
WBC: 7.3 10*3/uL (ref 4.0–10.5)
nRBC: 0 % (ref 0.0–0.2)
nRBC: 0 % (ref 0.0–0.2)

## 2022-12-12 LAB — IRON AND TIBC
Iron: 145 ug/dL (ref 28–170)
Saturation Ratios: 62 % — ABNORMAL HIGH (ref 10.4–31.8)
TIBC: 232 ug/dL — ABNORMAL LOW (ref 250–450)
UIBC: 87 ug/dL

## 2022-12-12 LAB — FERRITIN: Ferritin: 256 ng/mL (ref 11–307)

## 2022-12-12 LAB — BASIC METABOLIC PANEL
Anion gap: 12 (ref 5–15)
BUN: 74 mg/dL — ABNORMAL HIGH (ref 6–20)
CO2: 23 mmol/L (ref 22–32)
Calcium: 9.4 mg/dL (ref 8.9–10.3)
Chloride: 102 mmol/L (ref 98–111)
Creatinine, Ser: 5.93 mg/dL — ABNORMAL HIGH (ref 0.44–1.00)
GFR, Estimated: 9 mL/min — ABNORMAL LOW (ref >60)
Glucose, Bld: 88 mg/dL (ref 70–99)
Potassium: 4.7 mmol/L (ref 3.5–5.1)
Sodium: 137 mmol/L (ref 135–145)

## 2022-12-12 LAB — FOLATE: Folate: 8.8 ng/mL (ref >5.9)

## 2022-12-12 LAB — RETICULOCYTES
Immature Retic Fract: 11.3 % (ref 2.3–15.9)
RBC.: 3.12 MIL/uL — ABNORMAL LOW (ref 3.87–5.11)
Retic Count, Absolute: 59.3 10*3/uL (ref 19.0–186.0)
Retic Ct Pct: 1.9 % (ref 0.4–3.1)

## 2022-12-12 LAB — OCCULT BLOOD X 1 CARD TO LAB, STOOL: Fecal Occult Bld: NEGATIVE

## 2022-12-12 LAB — TSH: TSH: 5.633 u[IU]/mL — ABNORMAL HIGH (ref 0.350–4.500)

## 2022-12-12 MED ORDER — OXYCODONE HCL 5 MG PO TABS
5.0000 mg | ORAL_TABLET | Freq: Two times a day (BID) | ORAL | Status: DC | PRN
  Administered 2022-12-12 – 2022-12-13 (×2): 5 mg via ORAL
  Filled 2022-12-12 (×2): qty 1

## 2022-12-12 NOTE — Progress Notes (Signed)
PROGRESS NOTE  Natasha Long RSW:[id number] DOB: 26-Jun-1996   PCP: [name], MD  Patient is from: Behavioral health  DOA: 12/11/2022 LOS: 0  Chief complaints No chief complaint on file.    Brief Narrative / Interim history: 27 year old F with PMH of ESRD on HD MWF (noncompliant), systolic CHF, PTSD, BPD, depression and suicidal ideation for which she was admitted to behavioral health on 1/11, admitted to the hospital due to worsening anemia with hemoglobin down to 6.6 and associated lightheadedness.  No report of melena or hematochezia.  She was transfused 1 unit and Hgb came to 9.0 this morning.  Nephrology and psychiatry consulted.  Subjective: Patient is sleeping but wakes to voice.  She complains of back pain and feet pain that she attributes to neuropathy.  Does not feel like talking.  Wants to sleep.  Denies SI or HI.  Objective: Vitals:   12/12/22 0258 12/12/22 0505 12/12/22 0830 12/12/22 1146  BP: 138/89 (!) 139/94 (!) 142/107 (!) 138/95  Pulse: (!) 110 (!) 108  (!) 107  Resp: 17 16  15   Temp: 97.7 F (36.5 C) 98.2 F (36.8 C)  97.7 F (36.5 C)  TempSrc: Oral Oral  Axillary  SpO2: 97% 97%  94%    Examination:  GENERAL: No apparent distress.  Nontoxic. HEENT: MMM.  Vision and hearing grossly intact.  NECK: Supple.  No apparent JVD.  RESP:  No IWOB.  Fair aeration bilaterally. CVS:  RRR. Heart sounds normal.  ABD/GI/GU: BS+. Abd soft, NTND.  MSK/EXT:  Moves extremities. No apparent deformity. No edema.  SKIN: no apparent skin lesion or wound NEURO: Sleepy but wakes to voice.  Fairly oriented.  No apparent focal neuro deficit. PSYCH: Calm.  Denies SI or HI.  Procedures:  None  Microbiology summarized: None  Assessment and plan: Principal Problem:   Symptomatic anemia Active Problems:   ESRD on hemodialysis (HCC)   Heart failure with reduced ejection fraction (HCC)   Essential hypertension   Depression with suicidal ideation   Postural  dizziness with presyncope  Symptomatic anemia: Hgb 6.6 and improved to 9.0 after 1 unit, likely exaggerated response.  No report of melena or hematochezia.  Anemia panel in 09/2022 with iron deficiency.  Not on blood thinner Recent Labs    11/30/22 0315 12/04/22 1126 12/04/22 1524 12/04/22 1623 12/04/22 2122 12/05/22 0305 12/07/22 0811 12/08/22 1200 12/11/22 0859 12/12/22 0817  HGB 9.7* 8.7* 10.9* 6.5* 9.1* 8.0* 8.1* 7.5* 6.6* 9.0*  -Check anemia panel -Monitor H&H   ESRD on HD MWF Bone mineral disorder -Per nephrology.  No emergent need.  Chronic systolic CHF: TTE in 07/3817 with LVEF of 55 to 60% (45 to 50% previously).  Appears euvolemic on exam but difficult exam due to body habitus. -Continue home torsemide. -Fluid management by HD  Depression with suicidal ideation: Patient was admitted to behavioral arrest on 1/11 for depression and suicidal ideation.  Reluctantly denies SI/HI but not engaging much. -Psychiatry consulted on admission. -Continue one-to-one safety sitter  Presyncope/postural dizziness/hypertension: Could be due to anemia. -Check orthostatic vitals. -PT/OT  Neuropathy -Continue home Cymbalta and gabapentin  Obesity BMI 30.40 There is no height or weight on file to calculate BMI.          DVT prophylaxis:  heparin injection 5,000 Units Start: 12/12/22 0600  Code Status: Full code Family Communication: None at bedside Level of care: Telemetry Medical Status is: Observation The patient will require care spanning > 2 midnights and should be moved to  inpatient because: Symptomatic anemia   Final disposition: Behavioral health? Consultants:  Nephrology Psychiatry  35 minutes with more than 50% spent in reviewing records, counseling patient/family and coordinating care.   Sch Meds:  Scheduled Meds:  ARIPiprazole  7.5 mg Oral Daily   calcitRIOL  0.25 mcg Oral Q T,Th,Sa-HD   calcium acetate  667 mg Oral TID WC   carvedilol  6.25 mg  Oral BID WC   Chlorhexidine Gluconate Cloth  6 each Topical Q0600   DULoxetine  20 mg Oral Daily   gabapentin  100 mg Oral BID   heparin injection (subcutaneous)  5,000 Units Subcutaneous Q8H   hydrALAZINE  50 mg Oral Q8H   isosorbide mononitrate  60 mg Oral Daily   sevelamer carbonate  1,600 mg Oral TID WC   torsemide  100 mg Oral q morning   Continuous Infusions:  anticoagulant sodium citrate     PRN Meds:.acetaminophen **OR** acetaminophen, alteplase, anticoagulant sodium citrate, lidocaine-prilocaine, magnesium hydroxide, ondansetron **OR** ondansetron (ZOFRAN) IV, mouth rinse, pentafluoroprop-tetrafluoroeth, traZODone  Antimicrobials: Anti-infectives (From admission, onward)    None        I have personally reviewed the following labs and images: CBC: Recent Labs  Lab 12/07/22 0811 12/08/22 1200 12/11/22 0859 12/12/22 0817  WBC 10.2 10.8* 6.8 6.5  NEUTROABS  --  8.8*  --   --   HGB 8.1* 7.5* 6.6* 9.0*  HCT 26.1* 25.2* 22.2* 29.0*  MCV 95.3 98.1 95.7 92.7  PLT 133* 142* 124* 133*   BMP &GFR Recent Labs  Lab 12/06/22 0736 12/07/22 0811 12/08/22 1200 12/11/22 0859 12/12/22 0817  NA 134* 134* 136 136 137  K 3.6 3.8 3.9 4.8 4.7  CL 96* 95* 96* 101 102  CO2 25 24 26  21* 23  GLUCOSE 102* 99 111* 129* 88  BUN 65* 89* 73* 93* 74*  CREATININE 6.71* 7.89* 6.08* 7.61* 5.93*  CALCIUM 8.0* 8.8* 9.0 9.2 9.4  PHOS  --  5.0*  --  6.1*  --    Estimated Creatinine Clearance: 16.6 mL/min (A) (by C-G formula based on SCr of 5.93 mg/dL (H)). Liver & Pancreas: Recent Labs  Lab 12/07/22 0811 12/08/22 1200 12/11/22 0859  AST  --  43*  --   ALT  --  32  --   ALKPHOS  --  107  --   BILITOT  --  0.6  --   PROT  --  6.6  --   ALBUMIN 2.2* 2.5* 2.6*   No results for input(s): "LIPASE", "AMYLASE" in the last 168 hours. No results for input(s): "AMMONIA" in the last 168 hours. Diabetic: No results for input(s): "HGBA1C" in the last 72 hours. Recent Labs  Lab  12/11/22 2053  GLUCAP 125*   Cardiac Enzymes: No results for input(s): "CKTOTAL", "CKMB", "CKMBINDEX", "TROPONINI" in the last 168 hours. No results for input(s): "PROBNP" in the last 8760 hours. Coagulation Profile: No results for input(s): "INR", "PROTIME" in the last 168 hours. Thyroid Function Tests: Recent Labs    12/12/22 0817  TSH 5.633*   Lipid Profile: No results for input(s): "CHOL", "HDL", "LDLCALC", "TRIG", "CHOLHDL", "LDLDIRECT" in the last 72 hours. Anemia Panel: No results for input(s): "VITAMINB12", "FOLATE", "FERRITIN", "TIBC", "IRON", "RETICCTPCT" in the last 72 hours. Urine analysis:    Component Value Date/Time   COLORURINE YELLOW 06/02/2021 1221   APPEARANCEUR CLOUDY (A) 06/02/2021 1221   LABSPEC 1.010 06/02/2021 1221   PHURINE 7.0 06/02/2021 1221   GLUCOSEU 50 (A) 06/02/2021 1221  HGBUR NEGATIVE 06/02/2021 [place] 06/02/2021 1221   BILIRUBINUR negative 01/30/2020 1526   BILIRUBINUR neg 09/15/2017 1657   KETONESUR NEGATIVE 06/02/2021 1221   PROTEINUR 100 (A) 06/02/2021 1221   UROBILINOGEN 0.2 01/30/2020 1526   NITRITE NEGATIVE 06/02/2021 1221   LEUKOCYTESUR NEGATIVE 06/02/2021 1221   Sepsis Labs: Invalid input(s): "PROCALCITONIN", "LACTICIDVEN"  Microbiology: Recent Results (from the past 240 hour(s))  Resp panel by RT-PCR (RSV, Flu A&B, Covid) Anterior Nasal Swab     Status: None   Collection Time: 12/04/22  2:26 PM   Specimen: Anterior Nasal Swab  Result Value Ref Range Status   SARS Coronavirus 2 by RT PCR NEGATIVE NEGATIVE Final    Comment: (NOTE) SARS-CoV-2 target nucleic acids are NOT DETECTED.  The SARS-CoV-2 RNA is generally detectable in upper respiratory specimens during the acute phase of infection. The lowest concentration of SARS-CoV-2 viral copies this assay can detect is 138 copies/mL. A negative result does not preclude SARS-Cov-2 infection and should not be used as the sole basis for treatment or other  patient management decisions. A negative result may occur with  improper specimen collection/handling, submission of specimen other than nasopharyngeal swab, presence of viral mutation(s) within the areas targeted by this assay, and inadequate number of viral copies(<138 copies/mL). A negative result must be combined with clinical observations, patient history, and epidemiological information. The expected result is Negative.  Fact Sheet for Patients:  EntrepreneurPulse.com.au  Fact Sheet for Healthcare Providers:  IncredibleEmployment.be  This test is no t yet approved or cleared by the [place] FDA and  has been authorized for detection and/or diagnosis of SARS-CoV-2 by FDA under an Emergency Use Authorization (EUA). This EUA will remain  in effect (meaning this test can be used) for the duration of the COVID-19 declaration under Section 564(b)(1) of the Act, 21 U.S.C.section 360bbb-3(b)(1), unless the authorization is terminated  or revoked sooner.       Influenza A by PCR NEGATIVE NEGATIVE Final   Influenza B by PCR NEGATIVE NEGATIVE Final    Comment: (NOTE) The Xpert Xpress SARS-CoV-2/FLU/RSV plus assay is intended as an aid in the diagnosis of influenza from Nasopharyngeal swab specimens and should not be used as a sole basis for treatment. Nasal washings and aspirates are unacceptable for Xpert Xpress SARS-CoV-2/FLU/RSV testing.  Fact Sheet for Patients: EntrepreneurPulse.com.au  Fact Sheet for Healthcare Providers: IncredibleEmployment.be  This test is not yet approved or cleared by the [place] FDA and has been authorized for detection and/or diagnosis of SARS-CoV-2 by FDA under an Emergency Use Authorization (EUA). This EUA will remain in effect (meaning this test can be used) for the duration of the COVID-19 declaration under Section 564(b)(1) of the Act, 21 U.S.C. section  360bbb-3(b)(1), unless the authorization is terminated or revoked.     Resp Syncytial Virus by PCR NEGATIVE NEGATIVE Final    Comment: (NOTE) Fact Sheet for Patients: EntrepreneurPulse.com.au  Fact Sheet for Healthcare Providers: IncredibleEmployment.be  This test is not yet approved or cleared by the [place] FDA and has been authorized for detection and/or diagnosis of SARS-CoV-2 by FDA under an Emergency Use Authorization (EUA). This EUA will remain in effect (meaning this test can be used) for the duration of the COVID-19 declaration under Section 564(b)(1) of the Act, 21 U.S.C. section 360bbb-3(b)(1), unless the authorization is terminated or revoked.  Performed at [facility], [place] [address] [place], [place] [address]   Resp panel by RT-PCR (RSV, Flu A&B, Covid)  Anterior Nasal Swab     Status: None   Collection Time: 12/09/22 12:45 PM   Specimen: Anterior Nasal Swab  Result Value Ref Range Status   SARS Coronavirus 2 by RT PCR NEGATIVE NEGATIVE Final    Comment: (NOTE) SARS-CoV-2 target nucleic acids are NOT DETECTED.  The SARS-CoV-2 RNA is generally detectable in upper respiratory specimens during the acute phase of infection. The lowest concentration of SARS-CoV-2 viral copies this assay can detect is 138 copies/mL. A negative result does not preclude SARS-Cov-2 infection and should not be used as the sole basis for treatment or other patient management decisions. A negative result may occur with  improper specimen collection/handling, submission of specimen other than nasopharyngeal swab, presence of viral mutation(s) within the areas targeted by this assay, and inadequate number of viral copies(<138 copies/mL). A negative result must be combined with clinical observations, patient history, and epidemiological information. The expected result is Negative.  Fact Sheet for Patients:   EntrepreneurPulse.com.au  Fact Sheet for Healthcare Providers:  IncredibleEmployment.be  This test is no t yet approved or cleared by the [place] FDA and  has been authorized for detection and/or diagnosis of SARS-CoV-2 by FDA under an Emergency Use Authorization (EUA). This EUA will remain  in effect (meaning this test can be used) for the duration of the COVID-19 declaration under Section 564(b)(1) of the Act, 21 U.S.C.section 360bbb-3(b)(1), unless the authorization is terminated  or revoked sooner.       Influenza A by PCR NEGATIVE NEGATIVE Final   Influenza B by PCR NEGATIVE NEGATIVE Final    Comment: (NOTE) The Xpert Xpress SARS-CoV-2/FLU/RSV plus assay is intended as an aid in the diagnosis of influenza from Nasopharyngeal swab specimens and should not be used as a sole basis for treatment. Nasal washings and aspirates are unacceptable for Xpert Xpress SARS-CoV-2/FLU/RSV testing.  Fact Sheet for Patients: EntrepreneurPulse.com.au  Fact Sheet for Healthcare Providers: IncredibleEmployment.be  This test is not yet approved or cleared by the [place] FDA and has been authorized for detection and/or diagnosis of SARS-CoV-2 by FDA under an Emergency Use Authorization (EUA). This EUA will remain in effect (meaning this test can be used) for the duration of the COVID-19 declaration under Section 564(b)(1) of the Act, 21 U.S.C. section 360bbb-3(b)(1), unless the authorization is terminated or revoked.     Resp Syncytial Virus by PCR NEGATIVE NEGATIVE Final    Comment: (NOTE) Fact Sheet for Patients: EntrepreneurPulse.com.au  Fact Sheet for Healthcare Providers: IncredibleEmployment.be  This test is not yet approved or cleared by the [place] FDA and has been authorized for detection and/or diagnosis of SARS-CoV-2 by FDA under an Emergency Use  Authorization (EUA). This EUA will remain in effect (meaning this test can be used) for the duration of the COVID-19 declaration under Section 564(b)(1) of the Act, 21 U.S.C. section 360bbb-3(b)(1), unless the authorization is terminated or revoked.  Performed at [facility], [place] [address] [place], [place] [address]   MRSA Next Gen by PCR, Nasal     Status: None   Collection Time: 12/11/22 10:34 AM   Specimen: Nasal Mucosa; Nasal Swab  Result Value Ref Range Status   MRSA by PCR Next Gen NOT DETECTED NOT DETECTED Final    Comment: (NOTE) The GeneXpert MRSA Assay (FDA approved for NASAL specimens only), is one component of a comprehensive MRSA colonization surveillance program. It is not intended to diagnose MRSA infection nor to guide or monitor treatment for MRSA infections. Test performance  is not FDA approved in patients less than 60 years old. Performed at [facility], [address] [place], [place] [address]     Radiology Studies: No results found.    Jonia Oakey T. Copeland  If 7PM-7AM, please contact night-coverage [email or web address] 12/12/2022, 2:09 PM

## 2022-12-12 NOTE — Progress Notes (Signed)
Pt was discharged to medical telemetry due to symptomatic anemia.  At the time of discharge, pt was alert and oriented but had been complaining of increasing lightheadedness and feeling "horrible" and stated that this was typical for her when she experienced anemia.  Pt denied SI.  Admitting MD evaluated pt in the BMU prior to discharge.  Pt was monitored for safety, provided with discharge teaching and belongings, and accompanied to medical telemetry via wheelchair by [facility] staff after report given by this writer to receiving unit RN.

## 2022-12-12 NOTE — Evaluation (Signed)
Occupational Therapy Evaluation Patient Details Name: Natasha Long MRN: [id number] DOB: 10/10/1996 Today's Date: 12/12/2022   History of Present Illness 27 y.o.  female with past medical conditions including CHF, hypertension, PTSD, borderline personality disorder, and end-stage renal disease on hemodialysis, who was admitted to [facility] on 12/09/2022 for  Symptomatic anemia   Clinical Impression   Upon entering the room, pt supine in bed with eyes closed and sitter present. Pt in dark room and is upset that therapist turns on light and asks her to exit bed for assessment. OT educating pt on OT purpose and pt verbalized, " There is nothing wrong with me" and "go away". OT continues to encourage pt to participate by exiting the bed but she refuses. Pt noted to be performing AROM lifting B UEs and LEs into air dramatically as she is frustrated with therapist. Pt rolling around and moving herself in bed without assistance. Staff reporting pt taking self to bathroom without assistance but has safety sitter at this time secondary to reported SI plans. Pt has been homeless for the last year. Pt does not appear to have skilled acute OT needs at this time. OT to complete orders at this time.      Recommendations for follow up therapy are one component of a multi-disciplinary discharge planning process, led by the attending physician.  Recommendations may be updated based on patient status, additional functional criteria and insurance authorization.   Follow Up Recommendations  No OT follow up     Assistance Recommended at Discharge None        Equipment Recommendations  None recommended by OT       Precautions / Restrictions Precautions Precautions: Fall      Mobility Bed Mobility Overal bed mobility: Independent                  Transfers                   General transfer comment: per staff report - no physical assistance needed. Pt refusing OOB during this session.           ADL either performed or assessed with clinical judgement   ADL Overall ADL's : At baseline                                             Vision Patient Visual Report: No change from baseline              Pertinent Vitals/Pain Pain Assessment Pain Assessment: No/denies pain     Hand Dominance Right   Extremity/Trunk Assessment Upper Extremity Assessment Upper Extremity Assessment: Overall WFL for tasks assessed   Lower Extremity Assessment Lower Extremity Assessment: Overall WFL for tasks assessed       Communication Communication Communication: No difficulties   Cognition Arousal/Alertness: Awake/alert Behavior During Therapy: Agitated Overall Cognitive Status: Within Functional Limits for tasks assessed                                 General Comments: Pt answers questions but is very annoyed by therapist insisting she participate                Home Living Family/patient expects to be discharged to:: Shelter/Homeless  Additional Comments: reports not family in area      Prior Functioning/Environment Prior Level of Function : Independent/Modified Independent                                 OT Goals(Current goals can be found in the care plan section) Acute Rehab OT Goals Patient Stated Goal: "I want to rest" OT Goal Formulation: With patient Time For Goal Achievement: 12/12/22 Potential to Achieve Goals: Fair  OT Frequency:         AM-PAC OT "6 Clicks" Daily Activity     Outcome Measure Help from another person eating meals?: None Help from another person taking care of personal grooming?: None Help from another person toileting, which includes using toliet, bedpan, or urinal?: None Help from another person bathing (including washing, rinsing, drying)?: None Help from another person to put on and taking off regular upper body clothing?: None Help  from another person to put on and taking off regular lower body clothing?: None 6 Click Score: 24   End of Session Nurse Communication: Mobility status  Activity Tolerance: Other (comment) (Pt is self limiting) Patient left: in bed                   Time: 8828-0034 OT Time Calculation (min): 12 min Charges:  OT General Charges $OT Visit: 1 Visit OT Evaluation $OT Eval Low Complexity: 1 Low Darleen Crocker, MS, OTR/L , CBIS ascom [phone]  12/12/22, 2:07 PM

## 2022-12-12 NOTE — Progress Notes (Signed)
  [facility] Adult Case Management Discharge Plan :  Will you be returning to the same living situation after discharge:  No. Patient admitted to medical floor. Med Floor social worker to continue following patient.  At discharge, do you have transportation home?: Yes,  NA, patient not medically clear Do you have the ability to pay for your medications: Yes,  [facility]  Release of information consent forms completed and in the chart;  Patient's signature needed at discharge.  Patient to Follow up at:  Follow-up Information     [place] Follow up.   Why: PATIENT ADMITTED TO MEDICAL FLOOR Contact information: [place] [phone]                Next level of care provider has access to [place] and Suicide Prevention discussed: No. Patient discharged to medical floor prior to CSW being able to complete safety planning. Med floor social worker to complete safety planning.    Has patient been referred to the Quitline?: Patient refused referral  Patient has been referred for addiction treatment: Pt. refused referral  Durenda Hurt, [place] 12/12/2022, 8:46 AM

## 2022-12-12 NOTE — Assessment & Plan Note (Addendum)
-  The patient will be transferred to a medical telemetry bed. - The patient had subsequent presyncope. - She will be typed and crossmatched and transfused 2 units of packed red blood cells. - She was ordered IV iron transfusion earlier. - We will follow posttransfusion H&H.

## 2022-12-12 NOTE — Assessment & Plan Note (Signed)
-  Nephrology consult to be obtained for follow-up on HD.

## 2022-12-12 NOTE — Assessment & Plan Note (Signed)
-  We will continue her antihypertensives. 

## 2022-12-12 NOTE — Assessment & Plan Note (Addendum)
-  Psychiatry follow-up consult to be obtained. - I notified Dr. Weber Cooks. - We will continue Cymbalta and Abilify.

## 2022-12-12 NOTE — Progress Notes (Signed)
[facility]  ROUNDING NOTE   Subjective:   Patient seen resting in bed, minimal response to name Does respond to pain with moaning Safety sitter at bedside Room air Mild lower extremity edema remains  Objective:  Vital signs in last 24 hours:  Temp:  [97.7 F (36.5 C)-99.3 F (37.4 C)] 98.2 F (36.8 C) (01/13 0505) Pulse Rate:  [108-112] 108 (01/13 0505) Resp:  [16-27] 16 (01/13 0505) BP: (115-142)/(78-107) 142/107 (01/13 0830) SpO2:  [91 %-100 %] 97 % (01/13 0505) Weight:  [90.7 kg] 90.7 kg (01/12 1254)  Weight change:  There were no vitals filed for this visit.  Intake/Output: I/O last 3 completed shifts: In: 449.6 [I.V.:50; Blood:298; IV Piggyback:101.6] Out: -    Intake/Output this shift:  Total I/O In: 360 [P.O.:360] Out: -   Physical Exam: General: NAD, withdrawal  Head: Normocephalic, atraumatic. Moist oral mucosal membranes  Eyes: Anicteric  Lungs:  Clear to auscultation, normal effort, room air  Heart: Regular rate and rhythm  Abdomen:  Soft, nontender  Extremities: 1+ peripheral edema.  Neurologic:  moving all four extremities  Skin: No lesions  Access: Left aVF    Basic Metabolic Panel: Recent Labs  Lab 12/06/22 0736 12/07/22 0811 12/08/22 1200 12/11/22 0859 12/12/22 0817  NA 134* 134* 136 136 137  K 3.6 3.8 3.9 4.8 4.7  CL 96* 95* 96* 101 102  CO2 25 24 26  21* 23  GLUCOSE 102* 99 111* 129* 88  BUN 65* 89* 73* 93* 74*  CREATININE 6.71* 7.89* 6.08* 7.61* 5.93*  CALCIUM 8.0* 8.8* 9.0 9.2 9.4  PHOS  --  5.0*  --  6.1*  --     Liver Function Tests: Recent Labs  Lab 12/07/22 0811 12/08/22 1200 12/11/22 0859  AST  --  43*  --   ALT  --  32  --   ALKPHOS  --  107  --   BILITOT  --  0.6  --   PROT  --  6.6  --   ALBUMIN 2.2* 2.5* 2.6*   No results for input(s): "LIPASE", "AMYLASE" in the last 168 hours. No results for input(s): "AMMONIA" in the last 168 hours.  CBC: Recent Labs  Lab 12/07/22 0811 12/08/22 1200  12/11/22 0859 12/12/22 0817  WBC 10.2 10.8* 6.8 6.5  NEUTROABS  --  8.8*  --   --   HGB 8.1* 7.5* 6.6* 9.0*  HCT 26.1* 25.2* 22.2* 29.0*  MCV 95.3 98.1 95.7 92.7  PLT 133* 142* 124* 133*    Cardiac Enzymes: No results for input(s): "CKTOTAL", "CKMB", "CKMBINDEX", "TROPONINI" in the last 168 hours.  BNP: Invalid input(s): "POCBNP"  CBG: Recent Labs  Lab 12/11/22 2053  GLUCAP 125*    Microbiology: Results for orders placed or performed during the hospital encounter of 12/09/22  MRSA Next Gen by PCR, Nasal     Status: None   Collection Time: 12/11/22 10:34 AM   Specimen: Nasal Mucosa; Nasal Swab  Result Value Ref Range Status   MRSA by PCR Next Gen NOT DETECTED NOT DETECTED Final    Comment: (NOTE) The GeneXpert MRSA Assay (FDA approved for NASAL specimens only), is one component of a comprehensive MRSA colonization surveillance program. It is not intended to diagnose MRSA infection nor to guide or monitor treatment for MRSA infections. Test performance is not FDA approved in patients less than 84 years old. Performed at [facility], [address] [place], [place] [address]     Coagulation Studies:  No results for input(s): "LABPROT", "INR" in the last 72 hours.  Urinalysis: No results for input(s): "COLORURINE", "LABSPEC", "PHURINE", "GLUCOSEU", "HGBUR", "BILIRUBINUR", "KETONESUR", "PROTEINUR", "UROBILINOGEN", "NITRITE", "LEUKOCYTESUR" in the last 72 hours.  Invalid input(s): "APPERANCEUR"    Imaging: No results found.   Medications:    anticoagulant sodium citrate      ARIPiprazole  7.5 mg Oral Daily   calcitRIOL  0.25 mcg Oral Q T,Th,Sa-HD   calcium acetate  667 mg Oral TID WC   carvedilol  6.25 mg Oral BID WC   Chlorhexidine Gluconate Cloth  6 each Topical Q0600   DULoxetine  20 mg Oral Daily   gabapentin  100 mg Oral BID   heparin injection (subcutaneous)  5,000 Units Subcutaneous Q8H   hydrALAZINE  50 mg Oral Q8H   isosorbide  mononitrate  60 mg Oral Daily   sevelamer carbonate  1,600 mg Oral TID WC   torsemide  100 mg Oral q morning   acetaminophen **OR** acetaminophen, alteplase, anticoagulant sodium citrate, lidocaine-prilocaine, magnesium hydroxide, ondansetron **OR** ondansetron (ZOFRAN) IV, mouth rinse, pentafluoroprop-tetrafluoroeth, traZODone  Assessment/ Plan:  [name] is a 27 y.o.  female with past medical conditions including CHF, hypertension, PTSD, borderline personality disorder, and end-stage renal disease on hemodialysis, who was admitted to [facility] on 12/09/2022 for  Symptomatic anemia [D64.9]  [facility] [place]/MWF/left aVF  End-stage renal disease on hemodialysis.  Patient received dialysis yesterday, UF 2 L achieved.  Next treatment scheduled for Monday.  Renal navigator aware of patient and was notified that patient is in jeopardy of losing outpatient dialysis clinic clinic due to extended absence.  2. Anemia of chronic kidney disease Lab Results  Component Value Date   HGB 9.0 (L) 12/12/2022    Hemoglobin increased due to iron transfusion and 1 unit blood transfusion yesterday.  Patient admitted to hospitalist service.  May consider GI consult if needed.  Occult stool ordered yesterday.  3. Secondary Hyperparathyroidism: with outpatient labs: PTH 886, phosphorus 8.0, calcium 9.6 on 10/30/2022.               Continuing to monitor bone minerals during this admission.  4.  Hypertension with chronic kidney disease.  Patient currently prescribed carvedilol, hydralazine, isosorbide, and torsemide.  Currently receiving these medications.     LOS: 0   1/13/202411:13 AM
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
## 2022-12-12 NOTE — Progress Notes (Signed)
Received patient on unit in wheelchair. Patient is alert and oriented. No acute distress noted; no complaints voiced. Patient admission assessment completed. Patient settled into room. Will continue to monitor closely.

## 2022-12-12 NOTE — Assessment & Plan Note (Signed)
-  We will continue Coreg, hydralazine and Imdur.
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
## 2022-12-13 ENCOUNTER — Inpatient Hospital Stay: Payer: Medicaid Other

## 2022-12-13 DIAGNOSIS — M5440 Lumbago with sciatica, unspecified side: Secondary | ICD-10-CM

## 2022-12-13 DIAGNOSIS — M792 Neuralgia and neuritis, unspecified: Secondary | ICD-10-CM

## 2022-12-13 DIAGNOSIS — G8929 Other chronic pain: Secondary | ICD-10-CM

## 2022-12-13 DIAGNOSIS — M25521 Pain in right elbow: Secondary | ICD-10-CM

## 2022-12-13 DIAGNOSIS — M25522 Pain in left elbow: Secondary | ICD-10-CM

## 2022-12-13 LAB — TYPE AND SCREEN
ABO/RH(D): A NEG
Antibody Screen: NEGATIVE
Unit division: 0
Unit division: 0

## 2022-12-13 LAB — RENAL FUNCTION PANEL
Albumin: 2.6 g/dL — ABNORMAL LOW (ref 3.5–5.0)
Anion gap: 12 (ref 5–15)
BUN: 91 mg/dL — ABNORMAL HIGH (ref 6–20)
CO2: 23 mmol/L (ref 22–32)
Calcium: 9.4 mg/dL (ref 8.9–10.3)
Chloride: 101 mmol/L (ref 98–111)
Creatinine, Ser: 6.99 mg/dL — ABNORMAL HIGH (ref 0.44–1.00)
GFR, Estimated: 8 mL/min — ABNORMAL LOW (ref >60)
Glucose, Bld: 83 mg/dL (ref 70–99)
Phosphorus: 6.4 mg/dL — ABNORMAL HIGH (ref 2.5–4.6)
Potassium: 5.5 mmol/L — ABNORMAL HIGH (ref 3.5–5.1)
Sodium: 136 mmol/L (ref 135–145)

## 2022-12-13 LAB — CBC
HCT: 29.1 % — ABNORMAL LOW (ref 36.0–46.0)
Hemoglobin: 9.1 g/dL — ABNORMAL LOW (ref 12.0–15.0)
MCH: 28.9 pg (ref 26.0–34.0)
MCHC: 31.3 g/dL (ref 30.0–36.0)
MCV: 92.4 fL (ref 80.0–100.0)
Platelets: 159 10*3/uL (ref 150–400)
RBC: 3.15 MIL/uL — ABNORMAL LOW (ref 3.87–5.11)
RDW: 19 % — ABNORMAL HIGH (ref 11.5–15.5)
WBC: 7.4 10*3/uL (ref 4.0–10.5)
nRBC: 0 % (ref 0.0–0.2)

## 2022-12-13 LAB — BPAM RBC
Blood Product Expiration Date: 202402042359
Blood Product Expiration Date: 202402062359
ISSUE DATE / TIME: 202401122323
ISSUE DATE / TIME: 202401130230
Unit Type and Rh: 600
Unit Type and Rh: 600

## 2022-12-13 LAB — URIC ACID: Uric Acid, Serum: 5.9 mg/dL (ref 2.5–7.1)

## 2022-12-13 LAB — VITAMIN B12: Vitamin B-12: 620 pg/mL (ref 180–914)

## 2022-12-13 LAB — MAGNESIUM: Magnesium: 2.2 mg/dL (ref 1.7–2.4)

## 2022-12-13 MED ORDER — DOXYCYCLINE HYCLATE 100 MG PO TABS
100.0000 mg | ORAL_TABLET | Freq: Two times a day (BID) | ORAL | Status: DC
  Administered 2022-12-13 – 2022-12-14 (×3): 100 mg via ORAL
  Filled 2022-12-13 (×3): qty 1

## 2022-12-13 MED ORDER — ACETAMINOPHEN 500 MG PO TABS
1000.0000 mg | ORAL_TABLET | Freq: Three times a day (TID) | ORAL | Status: DC
  Administered 2022-12-13 – 2022-12-14 (×4): 1000 mg via ORAL
  Filled 2022-12-13 (×4): qty 2

## 2022-12-13 MED ORDER — OXYCODONE HCL 5 MG PO TABS
5.0000 mg | ORAL_TABLET | Freq: Three times a day (TID) | ORAL | Status: DC | PRN
  Administered 2022-12-13 – 2022-12-14 (×3): 5 mg via ORAL
  Filled 2022-12-13 (×3): qty 1

## 2022-12-13 MED ORDER — SODIUM ZIRCONIUM CYCLOSILICATE 10 G PO PACK
10.0000 g | PACK | Freq: Once | ORAL | Status: AC
  Administered 2022-12-13: 10 g via ORAL
  Filled 2022-12-13: qty 1

## 2022-12-13 MED ORDER — DICLOFENAC SODIUM 1 % EX GEL
2.0000 g | Freq: Four times a day (QID) | CUTANEOUS | Status: DC
  Administered 2022-12-13 – 2022-12-14 (×3): 2 g via TOPICAL
  Filled 2022-12-13: qty 100

## 2022-12-13 NOTE — Progress Notes (Signed)
The client signed into BMU voluntarily after admission and her IVC was discontinued.  She continues to contract for safety with no threat to herself or others.  Client stated she felt safe if the sitter was not in her room on assessment this afternoon.  Waylan Boga, PMHNP

## 2022-12-13 NOTE — Progress Notes (Addendum)
[facility]  ROUNDING NOTE   Subjective:   Patient seen laying in bed, safety sitter at bedside Appears withdrawn, minimal response to simple questioning Generalized edema remains Room air  Objective:  Vital signs in last 24 hours:  Temp:  [97.4 F (36.3 C)-98 F (36.7 C)] 97.5 F (36.4 C) (01/14 0441) Pulse Rate:  [103-107] 106 (01/14 0834) Resp:  [15-19] 15 (01/14 0441) BP: (118-138)/(63-100) 118/63 (01/14 0834) SpO2:  [94 %-96 %] 95 % (01/14 0441)  Weight change:  There were no vitals filed for this visit.  Intake/Output: I/O last 3 completed shifts: In: 1059.6 [P.O.:610; I.V.:50; Blood:298; IV Piggyback:101.6] Out: -    Intake/Output this shift:  Total I/O In: 250 [P.O.:250] Out: -   Physical Exam: General: NAD, withdrawal  Head: Normocephalic, atraumatic. Moist oral mucosal membranes  Eyes: Anicteric  Lungs:  Clear to auscultation, normal effort, room air  Heart: Regular rate and rhythm  Abdomen:  Soft, nontender  Extremities: 1+ peripheral edema.  Neurologic:  moving all four extremities  Skin: No lesions  Access: Left aVF    Basic Metabolic Panel: Recent Labs  Lab 12/07/22 0811 12/08/22 1200 12/11/22 0859 12/12/22 0817 12/13/22 0724  NA 134* 136 136 137 136  K 3.8 3.9 4.8 4.7 5.5*  CL 95* 96* 101 102 101  CO2 24 26 21* 23 23  GLUCOSE 99 111* 129* 88 83  BUN 89* 73* 93* 74* 91*  CREATININE 7.89* 6.08* 7.61* 5.93* 6.99*  CALCIUM 8.8* 9.0 9.2 9.4 9.4  MG  --   --   --   --  2.2  PHOS 5.0*  --  6.1*  --  6.4*     Liver Function Tests: Recent Labs  Lab 12/07/22 0811 12/08/22 1200 12/11/22 0859 12/13/22 0724  AST  --  43*  --   --   ALT  --  32  --   --   ALKPHOS  --  107  --   --   BILITOT  --  0.6  --   --   PROT  --  6.6  --   --   ALBUMIN 2.2* 2.5* 2.6* 2.6*    No results for input(s): "LIPASE", "AMYLASE" in the last 168 hours. No results for input(s): "AMMONIA" in the last 168 hours.  CBC: Recent Labs  Lab  12/08/22 1200 12/11/22 0859 12/12/22 0817 12/12/22 1619 12/13/22 0724  WBC 10.8* 6.8 6.5 7.3 7.4  NEUTROABS 8.8*  --   --   --   --   HGB 7.5* 6.6* 9.0* 8.9* 9.1*  HCT 25.2* 22.2* 29.0* 28.6* 29.1*  MCV 98.1 95.7 92.7 93.5 92.4  PLT 142* 124* 133* 134* 159     Cardiac Enzymes: No results for input(s): "CKTOTAL", "CKMB", "CKMBINDEX", "TROPONINI" in the last 168 hours.  BNP: Invalid input(s): "POCBNP"  CBG: Recent Labs  Lab 12/11/22 2053  GLUCAP 125*     Microbiology: Results for orders placed or performed during the hospital encounter of 12/09/22  MRSA Next Gen by PCR, Nasal     Status: None   Collection Time: 12/11/22 10:34 AM   Specimen: Nasal Mucosa; Nasal Swab  Result Value Ref Range Status   MRSA by PCR Next Gen NOT DETECTED NOT DETECTED Final    Comment: (NOTE) The GeneXpert MRSA Assay (FDA approved for NASAL specimens only), is one component of a comprehensive MRSA colonization surveillance program. It is not intended to diagnose MRSA infection nor to guide or monitor treatment for  MRSA infections. Test performance is not FDA approved in patients less than 81 years old. Performed at [facility], [address] [place], [place] [address]     Coagulation Studies: No results for input(s): "LABPROT", "INR" in the last 72 hours.  Urinalysis: No results for input(s): "COLORURINE", "LABSPEC", "PHURINE", "GLUCOSEU", "HGBUR", "BILIRUBINUR", "KETONESUR", "PROTEINUR", "UROBILINOGEN", "NITRITE", "LEUKOCYTESUR" in the last 72 hours.  Invalid input(s): "APPERANCEUR"    Imaging: No results found.   Medications:    anticoagulant sodium citrate      ARIPiprazole  7.5 mg Oral Daily   calcitRIOL  0.25 mcg Oral Q T,Th,Sa-HD   calcium acetate  667 mg Oral TID WC   carvedilol  6.25 mg Oral BID WC   Chlorhexidine Gluconate Cloth  6 each Topical Q0600   DULoxetine  20 mg Oral Daily   gabapentin  100 mg Oral BID   heparin injection (subcutaneous)  5,000  Units Subcutaneous Q8H   hydrALAZINE  50 mg Oral Q8H   isosorbide mononitrate  60 mg Oral Daily   sevelamer carbonate  1,600 mg Oral TID WC   torsemide  100 mg Oral q morning   acetaminophen **OR** acetaminophen, alteplase, anticoagulant sodium citrate, lidocaine-prilocaine, magnesium hydroxide, ondansetron **OR** ondansetron (ZOFRAN) IV, mouth rinse, oxyCODONE, pentafluoroprop-tetrafluoroeth, traZODone  Assessment/ Plan:  [name] is a 27 y.o.  female with past medical conditions including CHF, hypertension, PTSD, borderline personality disorder, and end-stage renal disease on hemodialysis, who was admitted to [facility] on 12/09/2022 for  Symptomatic anemia [D64.9]  [facility]/MWF/left aVF  End-stage renal disease on hemodialysis.  Next treatment scheduled for Monday.   Renal navigator aware of patient and was notified that patient is in jeopardy of losing outpatient dialysis clinic clinic due to extended absence.  2. Anemia of chronic kidney disease Lab Results  Component Value Date   HGB 9.1 (L) 12/13/2022    Patient has received IV iron and 1 unit blood transfusion.  Patient admitted to hospitalist service. Occult stool negative.  Hemoglobin stable  3. Secondary Hyperparathyroidism: with outpatient labs: PTH 886, phosphorus 8.0, calcium 9.6 on 10/30/2022.              Phosphorus remains elevated however calcium stable.  Continue sevelamer with meals.  4.  Hypertension with chronic kidney disease.  Patient currently prescribed carvedilol, hydralazine, isosorbide, and torsemide.  Currently receiving these medications.  Blood pressure currently 118/63.  5.  Hyperkalemia likely due to poorly monitored diet.  Partially consumed Gatorade and pomegranate juice bottles noted at bedside.  Will order Lokelma 10 g once.  Will recheck labs with dialysis tomorrow and provide any correction at that time.  Encourage patient to maintain a renal appropriate diet.  Will ensure renal diet  with fluid restriction in place as well.   LOS: 1   1/14/202411:01 AM

## 2022-12-13 NOTE — Progress Notes (Signed)
PROGRESS NOTE  Natasha Long TFT:[id number] DOB: March 26, 1996   PCP: [name], MD  Patient is from: Behavioral health  DOA: 12/11/2022 LOS: 1  Chief complaints No chief complaint on file.    Brief Narrative / Interim history: 27 year old F with PMH of ESRD on HD MWF (noncompliant), systolic CHF, PTSD, BPD, depression and suicidal ideation for which she was admitted to behavioral health on 1/11, admitted to the hospital due to worsening anemia with hemoglobin down to 6.6 and associated lightheadedness.  No report of melena or hematochezia.  She was transfused 1 unit and Hgb came to 9.0 this morning.  Nephrology and following.  No emergent need for dialysis.  Patient's hemoglobin remained stable at 1:09 unit of blood transfusion.  Hemoccult negative.  Chief complaint bilateral elbow pain.  Right elbow x-ray with nonspecific circumferential soft tissue swelling.  Uric acid 5.9.  Patient has no fever or leukocytosis.  Started on p.o. doxycycline for 5 days.  Medically stable for transfer back to [facility].  Psychiatry notified  Subjective: Seen and examined earlier this morning.  No major events overnight of this morning.  Complains of lower back pain, bilateral elbow pain and bilateral neuropathic feet pain.  Asking for increased dose of oxycodone.  She reports taking 10 mg oxycodone at home although I did not find this 1 under narcotic database.   Objective: Vitals:   12/12/22 2117 12/13/22 0058 12/13/22 0441 12/13/22 0834  BP: (!) 128/92 121/89 (!) 125/91 118/63  Pulse: (!) 103 (!) 106 (!) 104 (!) 106  Resp: 19 17 15    Temp: 98 F (36.7 C) (!) 97.4 F (36.3 C) (!) 97.5 F (36.4 C)   TempSrc: Oral Axillary Axillary   SpO2: 95% 96% 95%     Examination:  GENERAL: No apparent distress.  Nontoxic. HEENT: MMM.  Vision and hearing grossly intact.  NECK: Supple.  No apparent JVD.  RESP:  No IWOB.  Fair aeration bilaterally. CVS:  RRR. Heart sounds normal.  ABD/GI/GU: BS+. Abd  soft, NTND.  MSK/EXT:  Moves extremities.  No apparent deformity. FROM in both elbow.  Some hyperpigmentation over both elbow.  No erythema or fluctuance. SKIN: As above. NEURO: Awake and alert. Oriented appropriately.  No apparent focal neuro deficit.  Procedures:  None  Microbiology summarized: None  Assessment and plan: Principal Problem:   Symptomatic anemia Active Problems:   ESRD on hemodialysis (HCC)   Heart failure with reduced ejection fraction (HCC)   Essential hypertension   Chronic back pain   Depression with suicidal ideation   Postural dizziness with presyncope   Bilateral elbow joint pain   Neuropathic pain  Symptomatic anemia: Hgb 6.6 and improved to 9.0 after 1 unit.  Hemoccult negative.  H&H remained stable after 1 unit.  Anemia panel suggests anemia of chronic disease. Recent Labs    12/04/22 1524 12/04/22 1623 12/04/22 2122 12/05/22 0305 12/07/22 0811 12/08/22 1200 12/11/22 0859 12/12/22 0817 12/12/22 1619 12/13/22 0724  HGB 10.9* 6.5* 9.1* 8.0* 8.1* 7.5* 6.6* 9.0* 8.9* 9.1*  -Recheck CBC in 1 week  ESRD on HD MWF Bone mineral disorder Mild hyperkalemia -Per nephrology.  No emergent need. -Lokelma 10 g ordered by nephrology.  Chronic systolic CHF: TTE in 70/6237 with LVEF of 55 to 60% (45 to 50% previously).  Appears euvolemic on exam but difficult exam due to body habitus. -Continue home torsemide. -Fluid management by HD  Depression with suicidal ideation: Patient was admitted to behavioral arrest on 1/11 for depression and suicidal  ideation.  Reluctantly denied SI/HI but not engaging much. -Psychiatry consulted and notified by secure chat as well. -Continue one-to-one safety sitter  Presyncope/postural dizziness/hypertension: Could be due to anemia. -Check orthostatic vitals. -PT/OT-patient refusing.  Bilateral elbow pain, right> left, back pain and neuropathic bilateral feet pain: Exam reassuring.  X-ray of right elbow with prominent  circumferential soft tissue swelling.  She has a FROM.  No leukocytosis, fever or significant erythema.  Uric acid 5.9 -Start doxycycline 100 mg twice daily for 7 days to cover for possible cellulitis -Tylenol with as needed oxycodone for back pain -Added Voltaren gel for elbow pain. -Continue home Cymbalta and gabapentin for neuropathic pain.  Obesity BMI 30.40 There is no height or weight on file to calculate BMI.          DVT prophylaxis:  heparin injection 5,000 Units Start: 12/12/22 0600  Code Status: Full code Family Communication: None at bedside Level of care: Telemetry Medical Status is: Inpatient The patient will remain inpatient because: Depression with suicidal ideation pending psychiatry evaluation   Final disposition: Behavioral health? Consultants:  Nephrology Psychiatry  35 minutes with more than 50% spent in reviewing records, counseling patient/family and coordinating care.   Sch Meds:  Scheduled Meds:  ARIPiprazole  7.5 mg Oral Daily   calcitRIOL  0.25 mcg Oral Q T,Th,Sa-HD   calcium acetate  667 mg Oral TID WC   carvedilol  6.25 mg Oral BID WC   Chlorhexidine Gluconate Cloth  6 each Topical Q0600   diclofenac Sodium  2 g Topical QID   doxycycline  100 mg Oral Q12H   DULoxetine  20 mg Oral Daily   gabapentin  100 mg Oral BID   heparin injection (subcutaneous)  5,000 Units Subcutaneous Q8H   hydrALAZINE  50 mg Oral Q8H   isosorbide mononitrate  60 mg Oral Daily   sevelamer carbonate  1,600 mg Oral TID WC   sodium zirconium cyclosilicate  10 g Oral Once   torsemide  100 mg Oral q morning   Continuous Infusions:  anticoagulant sodium citrate     PRN Meds:.acetaminophen **OR** acetaminophen, alteplase, anticoagulant sodium citrate, lidocaine-prilocaine, magnesium hydroxide, ondansetron **OR** ondansetron (ZOFRAN) IV, mouth rinse, oxyCODONE, pentafluoroprop-tetrafluoroeth, traZODone  Antimicrobials: Anti-infectives (From admission, onward)     Start     Dose/Rate Route Frequency Ordered Stop   12/13/22 1300  doxycycline (VIBRA-TABS) tablet 100 mg        100 mg Oral Every 12 hours 12/13/22 1212 12/16/22 2159        I have personally reviewed the following labs and images: CBC: Recent Labs  Lab 12/08/22 1200 12/11/22 0859 12/12/22 0817 12/12/22 1619 12/13/22 0724  WBC 10.8* 6.8 6.5 7.3 7.4  NEUTROABS 8.8*  --   --   --   --   HGB 7.5* 6.6* 9.0* 8.9* 9.1*  HCT 25.2* 22.2* 29.0* 28.6* 29.1*  MCV 98.1 95.7 92.7 93.5 92.4  PLT 142* 124* 133* 134* 159   BMP &GFR Recent Labs  Lab 12/07/22 0811 12/08/22 1200 12/11/22 0859 12/12/22 0817 12/13/22 0724  NA 134* 136 136 137 136  K 3.8 3.9 4.8 4.7 5.5*  CL 95* 96* 101 102 101  CO2 24 26 21* 23 23  GLUCOSE 99 111* 129* 88 83  BUN 89* 73* 93* 74* 91*  CREATININE 7.89* 6.08* 7.61* 5.93* 6.99*  CALCIUM 8.8* 9.0 9.2 9.4 9.4  MG  --   --   --   --  2.2  PHOS 5.0*  --  6.1*  --  6.4*   Estimated Creatinine Clearance: 14.1 mL/min (A) (by C-G formula based on SCr of 6.99 mg/dL (H)). Liver & Pancreas: Recent Labs  Lab 12/07/22 0811 12/08/22 1200 12/11/22 0859 12/13/22 0724  AST  --  43*  --   --   ALT  --  32  --   --   ALKPHOS  --  107  --   --   BILITOT  --  0.6  --   --   PROT  --  6.6  --   --   ALBUMIN 2.2* 2.5* 2.6* 2.6*   No results for input(s): "LIPASE", "AMYLASE" in the last 168 hours. No results for input(s): "AMMONIA" in the last 168 hours. Diabetic: No results for input(s): "HGBA1C" in the last 72 hours. Recent Labs  Lab 12/11/22 2053  GLUCAP 125*   Cardiac Enzymes: No results for input(s): "CKTOTAL", "CKMB", "CKMBINDEX", "TROPONINI" in the last 168 hours. No results for input(s): "PROBNP" in the last 8760 hours. Coagulation Profile: No results for input(s): "INR", "PROTIME" in the last 168 hours. Thyroid Function Tests: Recent Labs    12/12/22 0817  TSH 5.633*   Lipid Profile: No results for input(s): "CHOL", "HDL", "LDLCALC", "TRIG",  "CHOLHDL", "LDLDIRECT" in the last 72 hours. Anemia Panel: Recent Labs    12/12/22 1619  VITAMINB12 620  FOLATE 8.8  FERRITIN 256  TIBC 232*  IRON 145  RETICCTPCT 1.9   Urine analysis:    Component Value Date/Time   COLORURINE YELLOW 06/02/2021 1221   APPEARANCEUR CLOUDY (A) 06/02/2021 1221   LABSPEC 1.010 06/02/2021 1221   PHURINE 7.0 06/02/2021 1221   GLUCOSEU 50 (A) 06/02/2021 1221   HGBUR NEGATIVE 06/02/2021 1221   [place] 06/02/2021 1221   BILIRUBINUR negative 01/30/2020 1526   BILIRUBINUR neg 09/15/2017 1657   KETONESUR NEGATIVE 06/02/2021 1221   PROTEINUR 100 (A) 06/02/2021 1221   UROBILINOGEN 0.2 01/30/2020 1526   NITRITE NEGATIVE 06/02/2021 1221   LEUKOCYTESUR NEGATIVE 06/02/2021 1221   Sepsis Labs: Invalid input(s): "PROCALCITONIN", "LACTICIDVEN"  Microbiology: Recent Results (from the past 240 hour(s))  Resp panel by RT-PCR (RSV, Flu A&B, Covid) Anterior Nasal Swab     Status: None   Collection Time: 12/04/22  2:26 PM   Specimen: Anterior Nasal Swab  Result Value Ref Range Status   SARS Coronavirus 2 by RT PCR NEGATIVE NEGATIVE Final    Comment: (NOTE) SARS-CoV-2 target nucleic acids are NOT DETECTED.  The SARS-CoV-2 RNA is generally detectable in upper respiratory specimens during the acute phase of infection. The lowest concentration of SARS-CoV-2 viral copies this assay can detect is 138 copies/mL. A negative result does not preclude SARS-Cov-2 infection and should not be used as the sole basis for treatment or other patient management decisions. A negative result may occur with  improper specimen collection/handling, submission of specimen other than nasopharyngeal swab, presence of viral mutation(s) within the areas targeted by this assay, and inadequate number of viral copies(<138 copies/mL). A negative result must be combined with clinical observations, patient history, and epidemiological information. The expected result is  Negative.  Fact Sheet for Patients:  EntrepreneurPulse.com.au  Fact Sheet for Healthcare Providers:  IncredibleEmployment.be  This test is no t yet approved or cleared by the [place] FDA and  has been authorized for detection and/or diagnosis of SARS-CoV-2 by FDA under an Emergency Use Authorization (EUA). This EUA will remain  in effect (meaning this test can be used) for the duration of the COVID-19  declaration under Section 564(b)(1) of the Act, 21 U.S.C.section 360bbb-3(b)(1), unless the authorization is terminated  or revoked sooner.       Influenza A by PCR NEGATIVE NEGATIVE Final   Influenza B by PCR NEGATIVE NEGATIVE Final    Comment: (NOTE) The Xpert Xpress SARS-CoV-2/FLU/RSV plus assay is intended as an aid in the diagnosis of influenza from Nasopharyngeal swab specimens and should not be used as a sole basis for treatment. Nasal washings and aspirates are unacceptable for Xpert Xpress SARS-CoV-2/FLU/RSV testing.  Fact Sheet for Patients: EntrepreneurPulse.com.au  Fact Sheet for Healthcare Providers: IncredibleEmployment.be  This test is not yet approved or cleared by the [place] FDA and has been authorized for detection and/or diagnosis of SARS-CoV-2 by FDA under an Emergency Use Authorization (EUA). This EUA will remain in effect (meaning this test can be used) for the duration of the COVID-19 declaration under Section 564(b)(1) of the Act, 21 U.S.C. section 360bbb-3(b)(1), unless the authorization is terminated or revoked.     Resp Syncytial Virus by PCR NEGATIVE NEGATIVE Final    Comment: (NOTE) Fact Sheet for Patients: EntrepreneurPulse.com.au  Fact Sheet for Healthcare Providers: IncredibleEmployment.be  This test is not yet approved or cleared by the [place] FDA and has been authorized for detection and/or diagnosis of  SARS-CoV-2 by FDA under an Emergency Use Authorization (EUA). This EUA will remain in effect (meaning this test can be used) for the duration of the COVID-19 declaration under Section 564(b)(1) of the Act, 21 U.S.C. section 360bbb-3(b)(1), unless the authorization is terminated or revoked.  Performed at [facility], [place] [address], [place],  [address]   Resp panel by RT-PCR (RSV, Flu A&B, Covid) Anterior Nasal Swab     Status: None   Collection Time: 12/09/22 12:45 PM   Specimen: Anterior Nasal Swab  Result Value Ref Range Status   SARS Coronavirus 2 by RT PCR NEGATIVE NEGATIVE Final    Comment: (NOTE) SARS-CoV-2 target nucleic acids are NOT DETECTED.  The SARS-CoV-2 RNA is generally detectable in upper respiratory specimens during the acute phase of infection. The lowest concentration of SARS-CoV-2 viral copies this assay can detect is 138 copies/mL. A negative result does not preclude SARS-Cov-2 infection and should not be used as the sole basis for treatment or other patient management decisions. A negative result may occur with  improper specimen collection/handling, submission of specimen other than nasopharyngeal swab, presence of viral mutation(s) within the areas targeted by this assay, and inadequate number of viral copies(<138 copies/mL). A negative result must be combined with clinical observations, patient history, and epidemiological information. The expected result is Negative.  Fact Sheet for Patients:  EntrepreneurPulse.com.au  Fact Sheet for Healthcare Providers:  IncredibleEmployment.be  This test is no t yet approved or cleared by the [place] FDA and  has been authorized for detection and/or diagnosis of SARS-CoV-2 by FDA under an Emergency Use Authorization (EUA). This EUA will remain  in effect (meaning this test can be used) for the duration of the COVID-19 declaration under Section 564(b)(1) of the  Act, 21 U.S.C.section 360bbb-3(b)(1), unless the authorization is terminated  or revoked sooner.       Influenza A by PCR NEGATIVE NEGATIVE Final   Influenza B by PCR NEGATIVE NEGATIVE Final    Comment: (NOTE) The Xpert Xpress SARS-CoV-2/FLU/RSV plus assay is intended as an aid in the diagnosis of influenza from Nasopharyngeal swab specimens and should not be used as a sole basis for treatment. Nasal washings and  aspirates are unacceptable for Xpert Xpress SARS-CoV-2/FLU/RSV testing.  Fact Sheet for Patients: EntrepreneurPulse.com.au  Fact Sheet for Healthcare Providers: IncredibleEmployment.be  This test is not yet approved or cleared by the [place] FDA and has been authorized for detection and/or diagnosis of SARS-CoV-2 by FDA under an Emergency Use Authorization (EUA). This EUA will remain in effect (meaning this test can be used) for the duration of the COVID-19 declaration under Section 564(b)(1) of the Act, 21 U.S.C. section 360bbb-3(b)(1), unless the authorization is terminated or revoked.     Resp Syncytial Virus by PCR NEGATIVE NEGATIVE Final    Comment: (NOTE) Fact Sheet for Patients: EntrepreneurPulse.com.au  Fact Sheet for Healthcare Providers: IncredibleEmployment.be  This test is not yet approved or cleared by the [place] FDA and has been authorized for detection and/or diagnosis of SARS-CoV-2 by FDA under an Emergency Use Authorization (EUA). This EUA will remain in effect (meaning this test can be used) for the duration of the COVID-19 declaration under Section 564(b)(1) of the Act, 21 U.S.C. section 360bbb-3(b)(1), unless the authorization is terminated or revoked.  Performed at [facility], [place] [address] [place], [place] [address]   MRSA Next Gen by PCR, Nasal     Status: None   Collection Time: 12/11/22 10:34 AM   Specimen: Nasal Mucosa; Nasal Swab  Result  Value Ref Range Status   MRSA by PCR Next Gen NOT DETECTED NOT DETECTED Final    Comment: (NOTE) The GeneXpert MRSA Assay (FDA approved for NASAL specimens only), is one component of a comprehensive MRSA colonization surveillance program. It is not intended to diagnose MRSA infection nor to guide or monitor treatment for MRSA infections. Test performance is not FDA approved in patients less than 18 years old. Performed at [facility], [address], [place], [place] [address]     Radiology Studies: DG Elbow 2 Views Right  Result Date: 12/13/2022 CLINICAL DATA:  Right elbow pain EXAM: RIGHT ELBOW - 2 VIEW COMPARISON:  None Available. FINDINGS: There is no evidence of fracture, dislocation, or joint effusion. There is no evidence of arthropathy or other focal bone abnormality. Prominent circumferential soft tissue swelling. Atherosclerotic vascular calcification, advanced for age. Nonspecific soft tissue calcification along the dorsal aspect of the proximal forearm. IMPRESSION: 1. No acute osseous abnormality of the right elbow. 2. Prominent circumferential soft tissue swelling. 3. Atherosclerotic vascular calcification, advanced for age. Electronically Signed   By: Davina Poke D.O.   On: 12/13/2022 11:30      Jaylaa Gallion T. [place]  If 7PM-7AM, please contact night-coverage [email or web address] 12/13/2022, 12:28 PM

## 2022-12-13 NOTE — Evaluation (Signed)
Physical Therapy Evaluation and Discharge  Patient Details Name: Natasha Long MRN: [id number] DOB: 07-Aug-1996 Today's Date: 12/13/2022  History of Present Illness  Patient is a 27 year old female with history of non-compliance, ESRD on HD, CHF, PTSD, borderline personality disorder, suicidal ideation and depression. Admitted from behavorial health with anemia and associated lightheadedness.   Clinical Impression  Patient was agitated with any encouragement to get out of bed to assess ambulation. She is independently moving around in bed and complaining of right arm pain. She has active movement in the arm but does not allow therapist to fully assess strength, etc. Encouraged patient to elevated right arm on a pillow for comfort which she allowed therapist to perform. Staff reports patient has ambulated to the bathroom independently. No apparent PT needs at this time.      Recommendations for follow up therapy are one component of a multi-disciplinary discharge planning process, led by the attending physician.  Recommendations may be updated based on patient status, additional functional criteria and insurance authorization.  Follow Up Recommendations No PT follow up      Assistance Recommended at Discharge None  Patient can return home with the following  Assist for transportation    Equipment Recommendations None recommended by PT  Recommendations for Other Services       Functional Status Assessment Patient has not had a recent decline in their functional status     Precautions / Restrictions Precautions Precautions: Fall Restrictions Weight Bearing Restrictions: No      Mobility  Bed Mobility Overal bed mobility: Independent             General bed mobility comments: independent with repositioning and sitting up in the bed. patient declined further mobility and asked therapist to leave her alone    Transfers                   General transfer comment:  patient refused. per staff report, patient ambulates to the bathroom independently. patient does report feeling dizziness with getting up in the past and requested a wheelchair. encouraged patient to allow therapist to assess mobility, but she adamantly refused    Ambulation/Gait                  Stairs            Wheelchair Mobility    Modified Rankin (Stroke Patients Only)       Balance                                             Pertinent Vitals/Pain Pain Assessment Pain Assessment: Faces Faces Pain Scale: Hurts a little bit Pain Location: R upper arm and elbow Pain Descriptors / Indicators: Sore Pain Intervention(s): Limited activity within patient's tolerance, Monitored during session, Repositioned    Home Living Family/patient expects to be discharged to:: Shelter/Homeless                        Prior Function Prior Level of Function : Independent/Modified Independent             Mobility Comments: patient reports had a wheelchair but the wheel is broken. she can ambulate independently but has chronic pain in her hips, feet, etc.       Hand Dominance        Extremity/Trunk Assessment  Upper Extremity Assessment Upper Extremity Assessment: RUE deficits/detail RUE Deficits / Details: patient complains of right upper arm and elbow pain. patient able to activate shoulder, elbow, and hand/wrist movement through limited ROM. she is unwilling to allow therapist to touch her arm. encouraged repositioning on a pillow for comfort which she allowed therapist to complete    Lower Extremity Assessment Lower Extremity Assessment: RLE deficits/detail;LLE deficits/detail RLE Deficits / Details: patient able to activate hip/knee/ankle movement. unable to formally assess as patient unwilling to participate. she reports chronic hip pain RLE Sensation: history of peripheral neuropathy LLE Deficits / Details: patient able to activate  hip/knee/ankle movement. unable to formally assess as patient unwilling to participate. she reports chronic hip pain LLE Sensation: history of peripheral neuropathy       Communication   Communication: No difficulties  Cognition Arousal/Alertness: Lethargic Behavior During Therapy: Agitated Overall Cognitive Status: Within Functional Limits for tasks assessed                                 General Comments: patient is very agitated when asked any questions, wants to be left alone        General Comments      Exercises     Assessment/Plan    PT Assessment Patient does not need any further PT services  PT Problem List         PT Treatment Interventions      PT Goals (Current goals can be found in the Care Plan section)  Acute Rehab PT Goals PT Goal Formulation: All assessment and education complete, DC therapy    Frequency       Co-evaluation               AM-PAC PT "6 Clicks" Mobility  Outcome Measure Help needed turning from your back to your side while in a flat bed without using bedrails?: None Help needed moving from lying on your back to sitting on the side of a flat bed without using bedrails?: None Help needed moving to and from a bed to a chair (including a wheelchair)?: None Help needed standing up from a chair using your arms (e.g., wheelchair or bedside chair)?: None Help needed to walk in hospital room?: None Help needed climbing 3-5 steps with a railing? : None 6 Click Score: 24    End of Session   Activity Tolerance: Treatment limited secondary to agitation Patient left: in bed;with call bell/phone within reach (1:1 sitter present) Nurse Communication: Mobility status PT Visit Diagnosis: Muscle weakness (generalized) (M62.81)    Time: 6144-3154 PT Time Calculation (min) (ACUTE ONLY): 16 min   Charges:   PT Evaluation $PT Eval Low Complexity: 1 Low         Minna Merritts, PT, MPT   Percell Locus 12/13/2022,  1:27 PM

## 2022-12-13 NOTE — Consult Note (Signed)
Client is medically cleared to return to the BMU.  Based on staffing, this cannot happen today.  Hopefully, this can happen tomorrow.  Waylan Boga, PMHNP

## 2022-12-13 NOTE — Progress Notes (Signed)
1:1 Sitter has been discontinued after Psych cleared the pt from SI and IVC has been DC.

## 2022-12-14 ENCOUNTER — Encounter: Payer: Self-pay | Admitting: Psychiatry

## 2022-12-14 ENCOUNTER — Other Ambulatory Visit: Payer: Self-pay

## 2022-12-14 ENCOUNTER — Inpatient Hospital Stay
Admission: RE | Admit: 2022-12-14 | Discharge: 2022-12-21 | DRG: 885 | Disposition: A | Discharge status: Other Acute Inpatient Hospital | Payer: Medicaid Other | Source: Intra-hospital | Attending: Psychiatry | Admitting: Psychiatry

## 2022-12-14 DIAGNOSIS — D649 Anemia, unspecified: Secondary | ICD-10-CM | POA: Diagnosis not present

## 2022-12-14 DIAGNOSIS — J45909 Unspecified asthma, uncomplicated: Secondary | ICD-10-CM | POA: Diagnosis not present

## 2022-12-14 DIAGNOSIS — Z9151 Personal history of suicidal behavior: Secondary | ICD-10-CM

## 2022-12-14 DIAGNOSIS — I5033 Acute on chronic diastolic (congestive) heart failure: Secondary | ICD-10-CM | POA: Diagnosis present

## 2022-12-14 DIAGNOSIS — F332 Major depressive disorder, recurrent severe without psychotic features: Secondary | ICD-10-CM | POA: Diagnosis present

## 2022-12-14 DIAGNOSIS — Z992 Dependence on renal dialysis: Secondary | ICD-10-CM | POA: Diagnosis not present

## 2022-12-14 DIAGNOSIS — F17210 Nicotine dependence, cigarettes, uncomplicated: Secondary | ICD-10-CM | POA: Diagnosis present

## 2022-12-14 DIAGNOSIS — N186 End stage renal disease: Secondary | ICD-10-CM | POA: Diagnosis present

## 2022-12-14 DIAGNOSIS — G4700 Insomnia, unspecified: Secondary | ICD-10-CM | POA: Diagnosis present

## 2022-12-14 DIAGNOSIS — F4310 Post-traumatic stress disorder, unspecified: Secondary | ICD-10-CM | POA: Diagnosis present

## 2022-12-14 DIAGNOSIS — R040 Epistaxis: Secondary | ICD-10-CM | POA: Diagnosis present

## 2022-12-14 DIAGNOSIS — Z5900 Homelessness unspecified: Secondary | ICD-10-CM

## 2022-12-14 DIAGNOSIS — D631 Anemia in chronic kidney disease: Secondary | ICD-10-CM | POA: Diagnosis present

## 2022-12-14 DIAGNOSIS — I132 Hypertensive heart and chronic kidney disease with heart failure and with stage 5 chronic kidney disease, or end stage renal disease: Secondary | ICD-10-CM | POA: Diagnosis present

## 2022-12-14 DIAGNOSIS — I5032 Chronic diastolic (congestive) heart failure: Secondary | ICD-10-CM | POA: Diagnosis not present

## 2022-12-14 DIAGNOSIS — I10 Essential (primary) hypertension: Secondary | ICD-10-CM | POA: Diagnosis not present

## 2022-12-14 DIAGNOSIS — E1122 Type 2 diabetes mellitus with diabetic chronic kidney disease: Secondary | ICD-10-CM | POA: Diagnosis present

## 2022-12-14 DIAGNOSIS — F1220 Cannabis dependence, uncomplicated: Secondary | ICD-10-CM | POA: Diagnosis present

## 2022-12-14 DIAGNOSIS — F603 Borderline personality disorder: Secondary | ICD-10-CM | POA: Diagnosis present

## 2022-12-14 DIAGNOSIS — I2089 Other forms of angina pectoris: Secondary | ICD-10-CM | POA: Diagnosis present

## 2022-12-14 DIAGNOSIS — F1190 Opioid use, unspecified, uncomplicated: Secondary | ICD-10-CM

## 2022-12-14 DIAGNOSIS — Z833 Family history of diabetes mellitus: Secondary | ICD-10-CM

## 2022-12-14 DIAGNOSIS — Z79899 Other long term (current) drug therapy: Secondary | ICD-10-CM | POA: Diagnosis not present

## 2022-12-14 DIAGNOSIS — M549 Dorsalgia, unspecified: Secondary | ICD-10-CM | POA: Diagnosis present

## 2022-12-14 DIAGNOSIS — R45851 Suicidal ideations: Secondary | ICD-10-CM | POA: Diagnosis present

## 2022-12-14 DIAGNOSIS — G8929 Other chronic pain: Secondary | ICD-10-CM | POA: Diagnosis present

## 2022-12-14 DIAGNOSIS — I5020 Unspecified systolic (congestive) heart failure: Secondary | ICD-10-CM | POA: Diagnosis not present

## 2022-12-14 LAB — RENAL FUNCTION PANEL
Albumin: 2.6 g/dL — ABNORMAL LOW (ref 3.5–5.0)
Anion gap: 10 (ref 5–15)
BUN: 92 mg/dL — ABNORMAL HIGH (ref 6–20)
CO2: 25 mmol/L (ref 22–32)
Calcium: 9.2 mg/dL (ref 8.9–10.3)
Chloride: 101 mmol/L (ref 98–111)
Creatinine, Ser: 7.81 mg/dL — ABNORMAL HIGH (ref 0.44–1.00)
GFR, Estimated: 7 mL/min — ABNORMAL LOW (ref >60)
Glucose, Bld: 93 mg/dL (ref 70–99)
Phosphorus: 6.3 mg/dL — ABNORMAL HIGH (ref 2.5–4.6)
Potassium: 5.4 mmol/L — ABNORMAL HIGH (ref 3.5–5.1)
Sodium: 136 mmol/L (ref 135–145)

## 2022-12-14 LAB — MAGNESIUM: Magnesium: 2.2 mg/dL (ref 1.7–2.4)

## 2022-12-14 MED ORDER — ACETAMINOPHEN 500 MG PO TABS: ORAL_TABLET | ORAL | 0 refills | Status: DC

## 2022-12-14 MED ORDER — EPOETIN ALFA 4000 UNIT/ML IJ SOLN
INTRAMUSCULAR | Status: AC
  Administered 2022-12-14: 4000 [IU] via INTRAVENOUS
  Filled 2022-12-14: qty 1

## 2022-12-14 MED ORDER — PENTAFLUOROPROP-TETRAFLUOROETH EX AERO: 1.0000 | INHALATION_SPRAY | CUTANEOUS | Status: DC | PRN

## 2022-12-14 MED ORDER — EPOETIN ALFA 4000 UNIT/ML IJ SOLN
4000.0000 [IU] | Freq: Once | INTRAMUSCULAR | Status: DC
  Filled 2022-12-14: qty 1

## 2022-12-14 MED ORDER — OXYCODONE HCL 5 MG PO TABS
5.0000 mg | ORAL_TABLET | Freq: Three times a day (TID) | ORAL | Status: DC | PRN
  Administered 2022-12-14 – 2022-12-18 (×7): 5 mg via ORAL
  Filled 2022-12-14 (×8): qty 1

## 2022-12-14 MED ORDER — HYDRALAZINE HCL 50 MG PO TABS
50.0000 mg | ORAL_TABLET | Freq: Three times a day (TID) | ORAL | Status: DC
  Administered 2022-12-14 – 2022-12-21 (×11): 50 mg via ORAL
  Filled 2022-12-14 (×23): qty 1

## 2022-12-14 MED ORDER — ISOSORBIDE MONONITRATE ER 60 MG PO TB24
60.0000 mg | ORAL_TABLET | Freq: Every day | ORAL | Status: DC
  Administered 2022-12-15 – 2022-12-21 (×7): 60 mg via ORAL
  Filled 2022-12-14 (×7): qty 1

## 2022-12-14 MED ORDER — DOXYCYCLINE HYCLATE 100 MG PO TABS
100.0000 mg | ORAL_TABLET | Freq: Two times a day (BID) | ORAL | Status: AC
  Administered 2022-12-14 – 2022-12-16 (×4): 100 mg via ORAL
  Filled 2022-12-14 (×4): qty 1

## 2022-12-14 MED ORDER — CARVEDILOL 6.25 MG PO TABS
6.2500 mg | ORAL_TABLET | Freq: Two times a day (BID) | ORAL | Status: DC
  Administered 2022-12-14 – 2022-12-21 (×11): 6.25 mg via ORAL
  Filled 2022-12-14 (×15): qty 1

## 2022-12-14 MED ORDER — CALCIUM ACETATE (PHOS BINDER) 667 MG PO CAPS
667.0000 mg | ORAL_CAPSULE | Freq: Three times a day (TID) | ORAL | Status: DC
  Administered 2022-12-14 – 2022-12-21 (×18): 667 mg via ORAL
  Filled 2022-12-14 (×22): qty 1

## 2022-12-14 MED ORDER — MAGNESIUM HYDROXIDE 400 MG/5ML PO SUSP
30.0000 mL | Freq: Every day | ORAL | Status: DC | PRN
  Administered 2022-12-20 – 2022-12-21 (×2): 30 mL via ORAL
  Filled 2022-12-14 (×2): qty 30

## 2022-12-14 MED ORDER — DOXYCYCLINE HYCLATE 100 MG PO TABS: 100.0000 mg | ORAL_TABLET | Freq: Two times a day (BID) | ORAL | 0 refills | Status: DC

## 2022-12-14 MED ORDER — CALCITRIOL 0.25 MCG PO CAPS
0.2500 ug | ORAL_CAPSULE | ORAL | Status: DC
  Administered 2022-12-17 – 2022-12-19 (×2): 0.25 ug via ORAL
  Filled 2022-12-14 (×3): qty 1

## 2022-12-14 MED ORDER — ACETAMINOPHEN 325 MG PO TABS
650.0000 mg | ORAL_TABLET | Freq: Four times a day (QID) | ORAL | Status: DC | PRN
  Administered 2022-12-14 – 2022-12-21 (×13): 650 mg via ORAL
  Filled 2022-12-14 (×13): qty 2

## 2022-12-14 MED ORDER — TRAZODONE HCL 50 MG PO TABS
25.0000 mg | ORAL_TABLET | Freq: Every evening | ORAL | Status: DC | PRN
  Administered 2022-12-14 – 2022-12-20 (×7): 25 mg via ORAL
  Filled 2022-12-14 (×9): qty 1

## 2022-12-14 MED ORDER — GABAPENTIN 100 MG PO CAPS
100.0000 mg | ORAL_CAPSULE | Freq: Two times a day (BID) | ORAL | Status: DC
  Administered 2022-12-14 – 2022-12-15 (×2): 100 mg via ORAL
  Filled 2022-12-14 (×2): qty 1

## 2022-12-14 MED ORDER — DULOXETINE HCL 20 MG PO CPEP
20.0000 mg | ORAL_CAPSULE | Freq: Every day | ORAL | Status: DC
  Administered 2022-12-15 – 2022-12-21 (×7): 20 mg via ORAL
  Filled 2022-12-14 (×7): qty 1

## 2022-12-14 MED ORDER — ARIPIPRAZOLE 5 MG PO TABS
7.5000 mg | ORAL_TABLET | Freq: Every day | ORAL | Status: DC
  Administered 2022-12-15 – 2022-12-21 (×7): 7.5 mg via ORAL
  Filled 2022-12-14 (×7): qty 2

## 2022-12-14 MED ORDER — SEVELAMER CARBONATE 800 MG PO TABS
1600.0000 mg | ORAL_TABLET | Freq: Three times a day (TID) | ORAL | Status: DC
  Administered 2022-12-14 – 2022-12-21 (×18): 1600 mg via ORAL
  Filled 2022-12-14 (×22): qty 2

## 2022-12-14 MED ORDER — DICLOFENAC SODIUM 1 % EX GEL
2.0000 g | Freq: Four times a day (QID) | CUTANEOUS | Status: DC
  Filled 2022-12-14 (×2): qty 100

## 2022-12-14 MED ORDER — TORSEMIDE 100 MG PO TABS
100.0000 mg | ORAL_TABLET | Freq: Every morning | ORAL | Status: DC
  Administered 2022-12-15 – 2022-12-21 (×7): 100 mg via ORAL
  Filled 2022-12-14 (×7): qty 1

## 2022-12-14 MED ORDER — LIDOCAINE-PRILOCAINE 2.5-2.5 % EX CREA: 1.0000 | TOPICAL_CREAM | CUTANEOUS | Status: DC | PRN

## 2022-12-14 MED ORDER — DARBEPOETIN ALFA 40 MCG/0.4ML IJ SOSY
40.0000 ug | PREFILLED_SYRINGE | Freq: Once | INTRAMUSCULAR | Status: DC
  Filled 2022-12-14: qty 0.4

## 2022-12-14 MED ORDER — ANTICOAGULANT SODIUM CITRATE 4% (200MG/5ML) IV SOLN: 5.0000 mL | Status: DC | PRN

## 2022-12-14 MED ORDER — ALTEPLASE 2 MG IJ SOLR: 2.0000 mg | Freq: Once | INTRAMUSCULAR | Status: DC | PRN

## 2022-12-14 MED ORDER — ALUM & MAG HYDROXIDE-SIMETH 200-200-20 MG/5ML PO SUSP
30.0000 mL | ORAL | Status: DC | PRN
  Filled 2022-12-14: qty 30

## 2022-12-14 NOTE — [facility] Group Notes (Signed)
[facility] Group Notes:  (Nursing/MHT/Case Management/Adjunct)  Date:  12/14/2022  Time:  10:29 PM  Type of Therapy:  Group Therapy  Participation Level:  Did Not Attend  Pt requested snack and meds be in her room. Pt experiencing nose bleeding and physical ailments.   Maglione,Saydie Gerdts E 12/14/2022, 10:29 PM

## 2022-12-14 NOTE — Progress Notes (Signed)
Admission note:  Consent signed, handbook detailing the patient's rights, responsibilities, and visitor guidelines provided. Skin/belongings search completed with noted left upper arm Dialysis Fistula. No bruising or other marks. No contraband. Patient oriented to her room and unit. Patient c/o generalized weakness and right arm pain 8/10 at this time. Requests to use wheelchair. Patient given the opportunity to express concerns and ask questions. No questions or concerns at present.

## 2022-12-14 NOTE — Progress Notes (Deleted)
Unable to obtain report on patient for HD. Staff in [facility] not answering phone.

## 2022-12-14 NOTE — TOC CM/SW Note (Signed)
  Transition of Care [facility]) Screening Note   Patient Details  Name: Natasha Long Date of Birth: 08-14-1996   [facility] CM/SW Contact:    Gerilyn Pilgrim, LCSW Phone Number: 12/14/2022, 10:35 AM    Transition of Care Department [facility]) has reviewed patient and no TOC needs have been identified at this time. We will continue to monitor patient advancement through interdisciplinary progression rounds. If new patient transition needs arise, please place a TOC consult.

## 2022-12-14 NOTE — Progress Notes (Signed)
Unable to obtain report on patient for HD. Primary RN unable to answer.

## 2022-12-14 NOTE — Progress Notes (Signed)
Pt refusing to have bp cuff on and for HD staff to do q15 minute bp checks. Stated, "you already got my blood pressure, you don't need it again". Requesting Oxycodone for pain, but not allowing staff to record vitals first. Sherlyn Hay, NP and H. Candiss Norse, MD notified.

## 2022-12-14 NOTE — Discharge Summary (Signed)
Physician Discharge Summary  Natasha Long WCH:[id number] DOB: January 06, 1996 DOA: 12/11/2022  PCP: [name], MD  Admit date: 12/11/2022 Discharge date: 12/14/2022 Admitted From: [facility] Disposition: [place] Recommendations for Outpatient Follow-up:   Check CBC in 1 week Please follow up on the following pending results: None   Discharge Condition: Stable CODE STATUS: Full code   Hospital course 27 year old F with PMH of ESRD on HD MWF (noncompliant), systolic CHF, PTSD, BPD, depression and suicidal ideation for which she was admitted to behavioral health on 1/11, admitted to the hospital due to worsening anemia with hemoglobin down to 6.6 and associated lightheadedness.  No report of melena or hematochezia.  She was transfused 1 unit and Hgb came to 9.0 this morning.  Nephrology and following.  No emergent need for dialysis.   Patient's hemoglobin remained stable at 1:09 unit of blood transfusion.  Hemoccult negative.  Chief complaint bilateral elbow pain.  Right elbow x-ray with nonspecific circumferential soft tissue swelling.  Uric acid 5.9.  Patient has no fever or leukocytosis.  Started on p.o. doxycycline for 7 days for possible cellulitis although this could be skin change from ESRD.   Patient medically stable and transferred to behavioral health unit per psychiatry recommendation.  See individual problem list below for more.   Problems addressed during this hospitalization Principal Problem:   Symptomatic anemia Active Problems:   ESRD on hemodialysis (HCC)   Heart failure with reduced ejection fraction (HCC)   Essential hypertension   Chronic back pain   Depression with suicidal ideation   Postural dizziness with presyncope   Bilateral elbow joint pain   Neuropathic pain   Symptomatic anemia: Likely anemia of renal disease.  Hgb 6.6 and improved to 9.0 after 1 unit.  Hemoccult negative.  H&H remained stable after 1 unit.  Anemia panel suggests anemia of chronic  disease. Recent Labs    12/04/22 1524 12/04/22 1623 12/04/22 2122 12/05/22 0305 12/07/22 0811 12/08/22 1200 12/11/22 0859 12/12/22 0817 12/12/22 1619 12/13/22 0724  HGB 10.9* 6.5* 9.1* 8.0* 8.1* 7.5* 6.6* 9.0* 8.9* 9.1*  -Recheck CBC in 1 week   ESRD on HD MWF Bone mineral disorder Mild hyperkalemia -Per nephrology.  No emergent need.   Chronic systolic CHF: TTE in 35/3614 with LVEF of 55 to 60% (45 to 50% previously).  Appears euvolemic on exam but difficult exam due to body habitus. -Continue home torsemide. -Fluid management by HD   Depression with suicidal ideation: Patient was from behavior health unit.  Has voluntary commitment.  -Transfer back to behavioral health unit today.   Presyncope/postural dizziness/hypotension: Could be due to anemia.  Resolved.   Bilateral elbow pain, right> left, back pain and neuropathic bilateral feet pain: Exam reassuring.  X-ray of right elbow with prominent circumferential soft tissue swelling.  She has a FROM.  No leukocytosis, fever or significant erythema.  Uric acid 5.9.  Started on doxycycline for 7 days for possible cellulitis although not convincing.  It could be skin changes related to ESRD. -Schedule Tylenol followed by as needed -Continue home Cymbalta and gabapentin for neuropathic pain.  Opiate misuse: Patient reported using 10 mg oxycodone at home which is not in narcotic database.  When confronted, she reported getting it on street.  -Consult to refrain from using nonprescribed meds   Obesity Body mass index is 32.01 kg/m.            Vital signs Vitals:   12/14/22 0548 12/14/22 0844 12/14/22 0845 12/14/22 0900  BP: Marland Kitchen)  145/96 (!) 145/89  135/83  Pulse: (!) 101 98  100  Temp: 98 F (36.7 C) (!) 97.5 F (36.4 C)    Resp: 18 (!) 21  (!) 27  Weight:   95.5 kg   SpO2: 93% 96%  98%  TempSrc: Oral Oral    BMI (Calculated):   32.02      Discharge exam  GENERAL: No apparent distress.  Nontoxic. HEENT: MMM.   Vision and hearing grossly intact.  NECK: Supple.  No apparent JVD.  RESP:  No IWOB.  Fair aeration bilaterally. CVS:  RRR. Heart sounds normal.  ABD/GI/GU: BS+. Abd soft, NTND.  MSK/EXT:  Moves extremities. Full range of motion in both elbows. SKIN: Thick dark skin just distal to both elbows, left > right.  No fluctuance.  No increased warmth to touch. NEURO: Awake and alert. Oriented appropriately.  No apparent focal neuro deficit. PSYCH: Calm. Normal affect.   Discharge Instructions Discharge Instructions     Diet - low sodium heart healthy   Complete by: As directed    Increase activity slowly   Complete by: As directed       Allergies as of 12/14/2022       Reactions   Bupropion Anxiety, Other (See Comments), Nausea And Vomiting   Caused panic attacks Caused panic attacks    "Adverse effects"   Prozac [fluoxetine Hcl] Anxiety, Other (See Comments)   Caused panic attacks. Dizziness    Prednisone Other (See Comments)   Pt stated this med caused pancreatitis. Dizziness        Medication List     STOP taking these medications    iron sucrose 300 mg in sodium chloride 0.9 % 250 mL   sodium chloride 0.9 % infusion       TAKE these medications    acetaminophen 500 MG tablet Commonly known as: TYLENOL Take 2 tablets (1,000 mg total) by mouth every 8 (eight) hours for 3 days, THEN 2 tablets (1,000 mg total) every 8 (eight) hours as needed for up to 5 days. Start taking on: December 14, 2022   alteplase 2 MG injection Commonly known as: CATHFLO ACTIVASE 2 mg by Intracatheter route once as needed for open catheter.   anticoagulant sodium citrate 4 GM/100ML Soln 5 mLs by Intracatheter route as needed (in dialysis, If TTP or HIT.).   ARIPiprazole 5 MG tablet Commonly known as: ABILIFY Take 1.5 tablets (7.5 mg total) by mouth daily.   calcitRIOL 0.25 MCG capsule Commonly known as: ROCALTROL Take 1 capsule (0.25 mcg total) by mouth Every Tuesday,Thursday,and  Saturday with dialysis.   calcium acetate 667 MG capsule Commonly known as: PHOSLO Take 1 capsule (667 mg total) by mouth 3 (three) times daily with meals.   carvedilol 6.25 MG tablet Commonly known as: COREG Take 1 tablet (6.25 mg total) by mouth 2 (two) times daily with a meal.   Chlorhexidine Gluconate Cloth 2 % Pads Apply 6 each topically daily at 6 (six) AM.   Darbepoetin Alfa 200 MCG/0.4ML Sosy injection Commonly known as: ARANESP Inject 0.4 mLs (200 mcg total) into the skin every Wednesday at 6 PM. Start taking on: December 16, 2022   doxycycline 100 MG tablet Commonly known as: VIBRA-TABS Take 1 tablet (100 mg total) by mouth every 12 (twelve) hours for 7 days.   DULoxetine 20 MG capsule Commonly known as: CYMBALTA Take 1 capsule (20 mg total) by mouth daily.   gabapentin 100 MG capsule Commonly known as: NEURONTIN Take 1  capsule (100 mg total) by mouth 2 (two) times daily.   heparin 1000 unit/mL Soln injection 1 mL (1,000 Units total) by Intracatheter route as needed (in dialysis).   hydrALAZINE 50 MG tablet Commonly known as: APRESOLINE Take 1 tablet (50 mg total) by mouth every 8 (eight) hours.   isosorbide mononitrate 60 MG 24 hr tablet Commonly known as: IMDUR Take 1 tablet (60 mg total) by mouth daily.   lidocaine-prilocaine cream Commonly known as: EMLA Apply 1 Application topically as needed (topical anesthesia for hemodialysis if Gebauers and Lidocaine injection are ineffective.).   pentafluoroprop-tetrafluoroeth Aero Commonly known as: GEBAUERS Apply 1 Application topically as needed (topical anesthesia for hemodialysis).   sevelamer carbonate 800 MG tablet Commonly known as: RENVELA Take 2 tablets (1,600 mg total) by mouth 3 (three) times daily with meals.   torsemide 100 MG tablet Commonly known as: DEMADEX Take 1 tablet (100 mg total) by mouth every morning.        Consultations: Nephrology Psychiatry  Procedures/Studies:   DG  Elbow 2 Views Right  Result Date: 12/13/2022 CLINICAL DATA:  Right elbow pain EXAM: RIGHT ELBOW - 2 VIEW COMPARISON:  None Available. FINDINGS: There is no evidence of fracture, dislocation, or joint effusion. There is no evidence of arthropathy or other focal bone abnormality. Prominent circumferential soft tissue swelling. Atherosclerotic vascular calcification, advanced for age. Nonspecific soft tissue calcification along the dorsal aspect of the proximal forearm. IMPRESSION: 1. No acute osseous abnormality of the right elbow. 2. Prominent circumferential soft tissue swelling. 3. Atherosclerotic vascular calcification, advanced for age. Electronically Signed   By: Davina Poke D.O.   On: 12/13/2022 11:30   CT Head Wo Contrast  Result Date: 12/04/2022 CLINICAL DATA:  Altered level of consciousness EXAM: CT HEAD WITHOUT CONTRAST TECHNIQUE: Contiguous axial images were obtained from the base of the skull through the vertex without intravenous contrast. RADIATION DOSE REDUCTION: This exam was performed according to the departmental dose-optimization program which includes automated exposure control, adjustment of the mA and/or kV according to patient size and/or use of iterative reconstruction technique. COMPARISON:  10/04/2022 FINDINGS: Brain: No acute infarct or hemorrhage. Lateral ventricles and midline structures are unremarkable. No acute extra-axial fluid collections. No mass effect. Vascular: No hyperdense vessel or unexpected calcification. Skull: Normal. Negative for fracture or focal lesion. Sinuses/Orbits: No acute finding. Other: None. IMPRESSION: 1. Stable head CT, no acute intracranial process. Electronically Signed   By: Randa Ngo M.D.   On: 12/04/2022 16:13   DG Chest Portable 1 View  Result Date: 12/04/2022 CLINICAL DATA:  Altered mental status EXAM: PORTABLE CHEST 1 VIEW COMPARISON:  Chest radiograph dated 11/25/2022 FINDINGS: Low lung volumes. Bibasilar patchy opacities. Small  bilateral pleural effusions. No pneumothorax. Similar enlarged cardiomediastinal silhouette. The visualized skeletal structures are unremarkable. IMPRESSION: 1. Low lung volumes with bibasilar patchy opacities, likely atelectasis. 2. Small bilateral pleural effusions.  Unchanged cardiomegaly. Electronically Signed   By: Darrin Nipper M.D.   On: 12/04/2022 14:49   DG Chest 2 View  Result Date: 11/26/2022 CLINICAL DATA:  Missed dialysis. Shortness of breath. EXAM: CHEST - 2 VIEW COMPARISON:  11/16/2022 FINDINGS: Stable cardiac enlargement. Persistent bilateral pleural effusions and overlying bibasilar atelectasis. Mild central vascular congestion but no overt pulmonary edema. IMPRESSION: Persistent cardiac enlargement, bilateral pleural effusions and overlying bibasilar atelectasis. Electronically Signed   By: Marijo Sanes M.D.   On: 11/26/2022 06:51   DG Chest 2 View  Result Date: 11/16/2022 CLINICAL DATA:  Hypoxia, exertional dyspnea, fatigue,  LEFT side chest pain, symptoms for 1 day, history hypertension, CHF, end-stage renal disease, smoker EXAM: CHEST - 2 VIEW COMPARISON:  11/01/2022 FINDINGS: Enlargement of cardiac silhouette with pulmonary vascular congestion. Bibasilar effusions and atelectasis. No definite pulmonary edema or segmental consolidation identified. No pneumothorax. Osseous structures unremarkable. IMPRESSION: Enlargement of cardiac silhouette with pulmonary vascular congestion. Bibasilar pleural effusions and atelectasis again seen. Electronically Signed   By: Lavonia Dana M.D.   On: 11/16/2022 11:28   DG Ankle Complete Left  Result Date: 11/16/2022 CLINICAL DATA:  Pain EXAM: LEFT ANKLE COMPLETE - 3 VIEW; RIGHT ANKLE - COMPLETE 3 VIEW COMPARISON:  None Available. FINDINGS: There is no evidence of acute fracture, dislocation, or joint effusion. Well corticated ossification at the medial malleolus on the right may be related to prior trauma. There is no evidence of arthropathy or other  focal bone abnormality. There is significant soft tissue swelling identified laterally on the left. IMPRESSION: Chronic posttraumatic changes right distal tibia. Soft tissue swelling lateral malleolus on the left. No acute osseous abnormalities. Electronically Signed   By: Sammie Bench M.D.   On: 11/16/2022 08:37   DG Ankle Complete Right  Result Date: 11/16/2022 CLINICAL DATA:  Pain EXAM: LEFT ANKLE COMPLETE - 3 VIEW; RIGHT ANKLE - COMPLETE 3 VIEW COMPARISON:  None Available. FINDINGS: There is no evidence of acute fracture, dislocation, or joint effusion. Well corticated ossification at the medial malleolus on the right may be related to prior trauma. There is no evidence of arthropathy or other focal bone abnormality. There is significant soft tissue swelling identified laterally on the left. IMPRESSION: Chronic posttraumatic changes right distal tibia. Soft tissue swelling lateral malleolus on the left. No acute osseous abnormalities. Electronically Signed   By: Sammie Bench M.D.   On: 11/16/2022 08:37       The results of significant diagnostics from this hospitalization (including imaging, microbiology, ancillary and laboratory) are listed below for reference.     Microbiology: Recent Results (from the past 240 hour(s))  Resp panel by RT-PCR (RSV, Flu A&B, Covid) Anterior Nasal Swab     Status: None   Collection Time: 12/04/22  2:26 PM   Specimen: Anterior Nasal Swab  Result Value Ref Range Status   SARS Coronavirus 2 by RT PCR NEGATIVE NEGATIVE Final    Comment: (NOTE) SARS-CoV-2 target nucleic acids are NOT DETECTED.  The SARS-CoV-2 RNA is generally detectable in upper respiratory specimens during the acute phase of infection. The lowest concentration of SARS-CoV-2 viral copies this assay can detect is 138 copies/mL. A negative result does not preclude SARS-Cov-2 infection and should not be used as the sole basis for treatment or other patient management decisions. A  negative result may occur with  improper specimen collection/handling, submission of specimen other than nasopharyngeal swab, presence of viral mutation(s) within the areas targeted by this assay, and inadequate number of viral copies(<138 copies/mL). A negative result must be combined with clinical observations, patient history, and epidemiological information. The expected result is Negative.  Fact Sheet for Patients:  EntrepreneurPulse.com.au  Fact Sheet for Healthcare Providers:  IncredibleEmployment.be  This test is no t yet approved or cleared by the [place] FDA and  has been authorized for detection and/or diagnosis of SARS-CoV-2 by FDA under an Emergency Use Authorization (EUA). This EUA will remain  in effect (meaning this test can be used) for the duration of the COVID-19 declaration under Section 564(b)(1) of the Act, 21 U.S.C.section 360bbb-3(b)(1), unless the authorization is terminated  or revoked sooner.       Influenza A by PCR NEGATIVE NEGATIVE Final   Influenza B by PCR NEGATIVE NEGATIVE Final    Comment: (NOTE) The Xpert Xpress SARS-CoV-2/FLU/RSV plus assay is intended as an aid in the diagnosis of influenza from Nasopharyngeal swab specimens and should not be used as a sole basis for treatment. Nasal washings and aspirates are unacceptable for Xpert Xpress SARS-CoV-2/FLU/RSV testing.  Fact Sheet for Patients: EntrepreneurPulse.com.au  Fact Sheet for Healthcare Providers: IncredibleEmployment.be  This test is not yet approved or cleared by the [place] FDA and has been authorized for detection and/or diagnosis of SARS-CoV-2 by FDA under an Emergency Use Authorization (EUA). This EUA will remain in effect (meaning this test can be used) for the duration of the COVID-19 declaration under Section 564(b)(1) of the Act, 21 U.S.C. section 360bbb-3(b)(1), unless the authorization  is terminated or revoked.     Resp Syncytial Virus by PCR NEGATIVE NEGATIVE Final    Comment: (NOTE) Fact Sheet for Patients: EntrepreneurPulse.com.au  Fact Sheet for Healthcare Providers: IncredibleEmployment.be  This test is not yet approved or cleared by the [place] FDA and has been authorized for detection and/or diagnosis of SARS-CoV-2 by FDA under an Emergency Use Authorization (EUA). This EUA will remain in effect (meaning this test can be used) for the duration of the COVID-19 declaration under Section 564(b)(1) of the Act, 21 U.S.C. section 360bbb-3(b)(1), unless the authorization is terminated or revoked.  Performed at [facility], [place] [address], [place], [place] [address]   Resp panel by RT-PCR (RSV, Flu A&B, Covid) Anterior Nasal Swab     Status: None   Collection Time: 12/09/22 12:45 PM   Specimen: Anterior Nasal Swab  Result Value Ref Range Status   SARS Coronavirus 2 by RT PCR NEGATIVE NEGATIVE Final    Comment: (NOTE) SARS-CoV-2 target nucleic acids are NOT DETECTED.  The SARS-CoV-2 RNA is generally detectable in upper respiratory specimens during the acute phase of infection. The lowest concentration of SARS-CoV-2 viral copies this assay can detect is 138 copies/mL. A negative result does not preclude SARS-Cov-2 infection and should not be used as the sole basis for treatment or other patient management decisions. A negative result may occur with  improper specimen collection/handling, submission of specimen other than nasopharyngeal swab, presence of viral mutation(s) within the areas targeted by this assay, and inadequate number of viral copies(<138 copies/mL). A negative result must be combined with clinical observations, patient history, and epidemiological information. The expected result is Negative.  Fact Sheet for Patients:  EntrepreneurPulse.com.au  Fact Sheet for Healthcare  Providers:  IncredibleEmployment.be  This test is no t yet approved or cleared by the [place] FDA and  has been authorized for detection and/or diagnosis of SARS-CoV-2 by FDA under an Emergency Use Authorization (EUA). This EUA will remain  in effect (meaning this test can be used) for the duration of the COVID-19 declaration under Section 564(b)(1) of the Act, 21 U.S.C.section 360bbb-3(b)(1), unless the authorization is terminated  or revoked sooner.       Influenza A by PCR NEGATIVE NEGATIVE Final   Influenza B by PCR NEGATIVE NEGATIVE Final    Comment: (NOTE) The Xpert Xpress SARS-CoV-2/FLU/RSV plus assay is intended as an aid in the diagnosis of influenza from Nasopharyngeal swab specimens and should not be used as a sole basis for treatment. Nasal washings and aspirates are unacceptable for Xpert Xpress SARS-CoV-2/FLU/RSV testing.  Fact Sheet for Patients: EntrepreneurPulse.com.au  Fact  Sheet for Healthcare Providers: IncredibleEmployment.be  This test is not yet approved or cleared by the [place] and has been authorized for detection and/or diagnosis of SARS-CoV-2 by FDA under an Emergency Use Authorization (EUA). This EUA will remain in effect (meaning this test can be used) for the duration of the COVID-19 declaration under Section 564(b)(1) of the Act, 21 U.S.C. section 360bbb-3(b)(1), unless the authorization is terminated or revoked.     Resp Syncytial Virus by PCR NEGATIVE NEGATIVE Final    Comment: (NOTE) Fact Sheet for Patients: EntrepreneurPulse.com.au  Fact Sheet for Healthcare Providers: IncredibleEmployment.be  This test is not yet approved or cleared by the [place] FDA and has been authorized for detection and/or diagnosis of SARS-CoV-2 by FDA under an Emergency Use Authorization (EUA). This EUA will remain in effect (meaning this test can be  used) for the duration of the COVID-19 declaration under Section 564(b)(1) of the Act, 21 U.S.C. section 360bbb-3(b)(1), unless the authorization is terminated or revoked.  Performed at [facility], [place] [address], [place], [place] [address]   MRSA Next Gen by PCR, Nasal     Status: None   Collection Time: 12/11/22 10:34 AM   Specimen: Nasal Mucosa; Nasal Swab  Result Value Ref Range Status   MRSA by PCR Next Gen NOT DETECTED NOT DETECTED Final    Comment: (NOTE) The GeneXpert MRSA Assay (FDA approved for NASAL specimens only), is one component of a comprehensive MRSA colonization surveillance program. It is not intended to diagnose MRSA infection nor to guide or monitor treatment for MRSA infections. Test performance is not FDA approved in patients less than 59 years old. Performed at [facility], [address] [place], [place] [address]      Labs:  CBC: Recent Labs  Lab 12/08/22 1200 12/11/22 0859 12/12/22 0817 12/12/22 1619 12/13/22 0724  WBC 10.8* 6.8 6.5 7.3 7.4  NEUTROABS 8.8*  --   --   --   --   HGB 7.5* 6.6* 9.0* 8.9* 9.1*  HCT 25.2* 22.2* 29.0* 28.6* 29.1*  MCV 98.1 95.7 92.7 93.5 92.4  PLT 142* 124* 133* 134* 159   BMP &GFR Recent Labs  Lab 12/08/22 1200 12/11/22 0859 12/12/22 0817 12/13/22 0724 12/14/22 0226  NA 136 136 137 136 136  K 3.9 4.8 4.7 5.5* 5.4*  CL 96* 101 102 101 101  CO2 26 21* 23 23 25   GLUCOSE 111* 129* 88 83 93  BUN 73* 93* 74* 91* 92*  CREATININE 6.08* 7.61* 5.93* 6.99* 7.81*  CALCIUM 9.0 9.2 9.4 9.4 9.2  MG  --   --   --  2.2 2.2  PHOS  --  6.1*  --  6.4* 6.3*   Estimated Creatinine Clearance: 13 mL/min (A) (by C-G formula based on SCr of 7.81 mg/dL (H)). Liver & Pancreas: Recent Labs  Lab 12/08/22 1200 12/11/22 0859 12/13/22 0724 12/14/22 0226  AST 43*  --   --   --   ALT 32  --   --   --   ALKPHOS 107  --   --   --   BILITOT 0.6  --   --   --   PROT 6.6  --   --   --   ALBUMIN 2.5* 2.6* 2.6*  2.6*   No results for input(s): "LIPASE", "AMYLASE" in the last 168 hours. No results for input(s): "AMMONIA" in the last 168 hours. Diabetic: No results for input(s): "HGBA1C" in the last  72 hours. Recent Labs  Lab 12/11/22 2053  GLUCAP 125*   Cardiac Enzymes: No results for input(s): "CKTOTAL", "CKMB", "CKMBINDEX", "TROPONINI" in the last 168 hours. No results for input(s): "PROBNP" in the last 8760 hours. Coagulation Profile: No results for input(s): "INR", "PROTIME" in the last 168 hours. Thyroid Function Tests: Recent Labs    12/12/22 0817  TSH 5.633*   Lipid Profile: No results for input(s): "CHOL", "HDL", "LDLCALC", "TRIG", "CHOLHDL", "LDLDIRECT" in the last 72 hours. Anemia Panel: Recent Labs    12/12/22 1619  VITAMINB12 620  FOLATE 8.8  FERRITIN 256  TIBC 232*  IRON 145  RETICCTPCT 1.9   Urine analysis:    Component Value Date/Time   COLORURINE YELLOW 06/02/2021 1221   APPEARANCEUR CLOUDY (A) 06/02/2021 1221   LABSPEC 1.010 06/02/2021 1221   PHURINE 7.0 06/02/2021 1221   GLUCOSEU 50 (A) 06/02/2021 1221   HGBUR NEGATIVE 06/02/2021 1221   [place] 06/02/2021 1221   BILIRUBINUR negative 01/30/2020 1526   BILIRUBINUR neg 09/15/2017 1657   KETONESUR NEGATIVE 06/02/2021 1221   PROTEINUR 100 (A) 06/02/2021 1221   UROBILINOGEN 0.2 01/30/2020 1526   NITRITE NEGATIVE 06/02/2021 1221   LEUKOCYTESUR NEGATIVE 06/02/2021 1221   Sepsis Labs: Invalid input(s): "PROCALCITONIN", "LACTICIDVEN"   SIGNED:  Mercy Riding, MD  [facility] 12/14/2022, 9:24 AM

## 2022-12-14 NOTE — Tx Team (Signed)
Initial Treatment Plan 12/14/2022 5:06 PM [name] PVV:[id number]    PATIENT STRESSORS: Educational concerns   Financial difficulties   Health problems   Legal issue   Loss of family support   Marital or family conflict   Medication change or noncompliance   Occupational concerns   Substance abuse   Traumatic event     PATIENT STRENGTHS: Ability for insight  Average or above average intelligence  Communication skills    PATIENT IDENTIFIED PROBLEMS: Support systems   General Health and Renal Health Concerns  Living arrangements   Financial  Substance Abuse              DISCHARGE CRITERIA:  Ability to meet basic life and health needs Adequate post-discharge living arrangements Improved stabilization in mood, thinking, and/or behavior Medical problems require only outpatient monitoring Motivation to continue treatment in a less acute level of care Need for constant or close observation no longer present Reduction of life-threatening or endangering symptoms to within safe limits Safe-care adequate arrangements made Verbal commitment to aftercare and medication compliance  PRELIMINARY DISCHARGE PLAN: Dialysis   PATIENT/FAMILY INVOLVEMENT: This treatment plan has been presented to and reviewed with the patient, Natasha Long.  The patient and family have been given the opportunity to ask questions and make suggestions.  Earvin Hansen, RN 12/14/2022, 5:06 PM

## 2022-12-14 NOTE — Progress Notes (Signed)
Patient being discharged. Patient will be transported via wheelchair to [facility]. Report called to [facility], RN in [facility]. PIV removed. Patient was escorted by security.

## 2022-12-14 NOTE — Progress Notes (Signed)
Patient did 2.51 hours of HD. Had to terminate the treatment early because patient refused blood pressure monitoring. She is complaining or Right upper arm pain and blood pressure was repositioned. She looks comfortable on bed. I explained to her that we need to monitor her blood pressure during dialysis and before and after medication administrations but patient was irritable and refusing. Hemodialysis MD and NP were notified.   Access used: Left AVF Access issues: none  Total UF removed: 2.4 L Medication(s) given: Epogen 4000 units  Post HD weight: 93 kg taken on bed. Patient is refusing to stand up.       12/14/22 1207  Vitals  Temp 99.5 F (37.5 C)  Temp Source Oral  BP  (patient refused blood pressure taking)  Pulse Rate 100  Pulse Rate Source Monitor  ECG Heart Rate (!) 104  Resp 20  Oxygen Therapy  SpO2 98 %  O2 Device Room Air  Patient Activity (if Appropriate) In bed  Pulse Oximetry Type Continuous  Post Treatment  Dialyzer Clearance Lightly streaked  Duration of HD Treatment -hour(s) 2.51 hour(s)  Hemodialysis Intake (mL) 0 mL  Liters Processed 68.2  Fluid Removed (mL) 2400 mL  Tolerated HD Treatment Yes (see notes)  Post-Hemodialysis Comments Had to terminate HD early. Patient is refusing blood pressure taking during HD.  AVG/AVF Arterial Site Held (minutes) 5 minutes  AVG/AVF Venous Site Held (minutes) 5 minutes  Fistula / Graft Left Upper arm  No placement date or time found.   Orientation: Left  Access Location: Upper arm  Site Condition No complications  Fistula / Graft Assessment Present;Thrill;Bruit  Drainage Description None   [facility]

## 2022-12-14 NOTE — BHH Suicide Risk Assessment (Signed)
[facility] Admission Suicide Risk Assessment   Nursing information obtained from:    Demographic factors:    Current Mental Status:    Loss Factors:    Historical Factors:    Risk Reduction Factors:     Total Time spent with patient: 45 minutes Principal Problem: Severe recurrent major depression without psychotic features ([place]) Diagnosis:  Principal Problem:   Severe recurrent major depression without psychotic features ([place]) Active Problems:   PTSD (post-traumatic stress disorder)   HTN (hypertension)   Borderline personality disorder (HCC)   Moderate cannabis use disorder (HCC)   ESRD (end stage renal disease) ([place])   Acute on chronic diastolic CHF (congestive heart failure) (HCC)   Chronic back pain   Homelessness  Subjective Data: Patient seen and chart reviewed.  Patient familiar from having just been on the unit recently.  27 year old woman with chronic medical and psychiatric pathology returns from the medical service.  Physically somewhat better but still having chronic pain.  Emotionally not acutely suicidal but still chronically anxious and depressed.  Has no place to live.  Patient currently is alert and oriented and stating she has no plan to harm herself on the unit.  Cooperative with nursing.  Continued Clinical Symptoms:    The "Alcohol Use Disorders Identification Test", Guidelines for Use in Primary Care, Second Edition.  World Pharmacologist [facility]). Score between 0-7:  no or low risk or alcohol related problems. Score between 8-15:  moderate risk of alcohol related problems. Score between 16-19:  high risk of alcohol related problems. Score 20 or above:  warrants further diagnostic evaluation for alcohol dependence and treatment.   CLINICAL FACTORS:   Severe Anxiety and/or Agitation Depression:   Anhedonia Medical Diagnoses and Treatments/Surgeries   Musculoskeletal: Strength & Muscle Tone: decreased Gait & Station: unable to stand Patient leans:  N/A  Psychiatric Specialty Exam:  Presentation  General Appearance:  Casual  Eye Contact: Fair  Speech: Clear and Coherent  Speech Volume: Normal  Handedness: Right   Mood and Affect  Mood: Depressed; Anxious  Affect: Congruent; Depressed   Thought Process  Thought Processes: Coherent  Descriptions of Associations:Intact  Orientation:Full (Time, Place and Person)  Thought Content:Illogical  History of Schizophrenia/Schizoaffective disorder:No  Duration of Psychotic Symptoms:No data recorded Hallucinations:No data recorded Ideas of Reference:None  Suicidal Thoughts:No data recorded Homicidal Thoughts:No data recorded  Sensorium  Memory: Immediate Good; Recent Good; Remote Good  Judgment: -- (impulsive)  Insight: Lacking   Executive Functions  Concentration: Fair  Attention Span: Fair  Recall: [place] of Knowledge: Good  Language: Good   Psychomotor Activity  Psychomotor Activity:No data recorded  Assets  Assets: Communication Skills; Desire for Improvement; Financial Resources/Insurance   Sleep  Sleep:No data recorded   Physical Exam: Physical Exam Vitals and nursing note reviewed.  Constitutional:      Appearance: Normal appearance. She is ill-appearing.  HENT:     Head: Normocephalic and atraumatic.     Mouth/Throat:     Pharynx: Oropharynx is clear.  Eyes:     Pupils: Pupils are equal, round, and reactive to light.  Cardiovascular:     Rate and Rhythm: Normal rate and regular rhythm.  Pulmonary:     Effort: Pulmonary effort is normal.     Breath sounds: Normal breath sounds.  Abdominal:     General: Abdomen is flat.     Palpations: Abdomen is soft.  Musculoskeletal:        General: Normal range of motion.  Skin:  General: Skin is warm and dry.       Neurological:     General: No focal deficit present.     Mental Status: She is alert. Mental status is at baseline.  Psychiatric:        Attention  and Perception: Attention normal.        Mood and Affect: Mood is depressed. Affect is blunt and tearful.        Speech: Speech is delayed.        Behavior: Behavior is slowed.        Thought Content: Thought content normal. Thought content does not include suicidal plan.    Review of Systems  Constitutional:  Positive for malaise/fatigue.  HENT: Negative.    Eyes: Negative.   Respiratory: Negative.    Cardiovascular: Negative.   Gastrointestinal: Negative.   Musculoskeletal:  Positive for back pain and joint pain.  Skin: Negative.   Neurological: Negative.   Psychiatric/Behavioral:  Positive for depression and suicidal ideas. Negative for hallucinations and substance abuse. The patient is nervous/anxious and has insomnia.    Blood pressure (!) 141/76, pulse (!) 103, resp. rate 20, SpO2 96 %. There is no height or weight on file to calculate BMI.   COGNITIVE FEATURES THAT CONTRIBUTE TO RISK:  None    SUICIDE RISK:   Mild:  Suicidal ideation of limited frequency, intensity, duration, and specificity.  There are no identifiable plans, no associated intent, mild dysphoria and related symptoms, good self-control (both objective and subjective assessment), few other risk factors, and identifiable protective factors, including available and accessible social support.  PLAN OF CARE: Continue 15-minute checks.  Daily assessment of dangerousness along with overall symptoms.  Appropriate treatment of multiple problems as appropriate.  Work on safe discharge planning prior to discharge  I certify that inpatient services furnished can reasonably be expected to improve the patient's condition.   Alethia Berthold, MD 12/14/2022, 2:34 PM

## 2022-12-14 NOTE — Progress Notes (Addendum)
[facility]  ROUNDING NOTE   Subjective:   Patient is seen and evaluated during dialysis   HEMODIALYSIS FLOWSHEET:  Blood Flow Rate (mL/min): 400 mL/min Arterial Pressure (mmHg): -130 mmHg Venous Pressure (mmHg): 240 mmHg TMP (mmHg): 9 mmHg Ultrafiltration Rate (mL/min): 1114 mL/min Dialysate Flow Rate (mL/min): 300 ml/min Dialysis Fluid Bolus: Normal Saline  No complaints at this time Sitting up in bed, eating breakfast Room air Mild extremity edema  Objective:  Vital signs in last 24 hours:  Temp:  [97.5 F (36.4 C)-98 F (36.7 C)] 97.5 F (36.4 C) (01/15 0844) Pulse Rate:  [95-102] 100 (01/15 0900) Resp:  [17-27] 27 (01/15 0900) BP: (130-145)/(69-96) 135/83 (01/15 0900) SpO2:  [93 %-98 %] 98 % (01/15 0900) Weight:  [95.5 kg] 95.5 kg (01/15 0845)  Weight change:  Filed Weights   12/14/22 0845  Weight: 95.5 kg    Intake/Output: I/O last 3 completed shifts: In: 700 [P.O.:700] Out: -    Intake/Output this shift:  No intake/output data recorded.  Physical Exam: General: NAD  Head: Normocephalic, atraumatic. Moist oral mucosal membranes  Eyes: Anicteric  Lungs:  Clear to auscultation, normal effort, room air  Heart: Regular rate and rhythm  Abdomen:  Soft, nontender  Extremities: 1+ peripheral edema.  Neurologic: Alert and oriented, moving all four extremities  Skin: No lesions  Access: Left aVF    Basic Metabolic Panel: Recent Labs  Lab 12/08/22 1200 12/11/22 0859 12/12/22 0817 12/13/22 0724 12/14/22 0226  NA 136 136 137 136 136  K 3.9 4.8 4.7 5.5* 5.4*  CL 96* 101 102 101 101  CO2 26 21* 23 23 25   GLUCOSE 111* 129* 88 83 93  BUN 73* 93* 74* 91* 92*  CREATININE 6.08* 7.61* 5.93* 6.99* 7.81*  CALCIUM 9.0 9.2 9.4 9.4 9.2  MG  --   --   --  2.2 2.2  PHOS  --  6.1*  --  6.4* 6.3*     Liver Function Tests: Recent Labs  Lab 12/08/22 1200 12/11/22 0859 12/13/22 0724 12/14/22 0226  AST 43*  --   --   --   ALT 32  --   --    --   ALKPHOS 107  --   --   --   BILITOT 0.6  --   --   --   PROT 6.6  --   --   --   ALBUMIN 2.5* 2.6* 2.6* 2.6*    No results for input(s): "LIPASE", "AMYLASE" in the last 168 hours. No results for input(s): "AMMONIA" in the last 168 hours.  CBC: Recent Labs  Lab 12/08/22 1200 12/11/22 0859 12/12/22 0817 12/12/22 1619 12/13/22 0724  WBC 10.8* 6.8 6.5 7.3 7.4  NEUTROABS 8.8*  --   --   --   --   HGB 7.5* 6.6* 9.0* 8.9* 9.1*  HCT 25.2* 22.2* 29.0* 28.6* 29.1*  MCV 98.1 95.7 92.7 93.5 92.4  PLT 142* 124* 133* 134* 159     Cardiac Enzymes: No results for input(s): "CKTOTAL", "CKMB", "CKMBINDEX", "TROPONINI" in the last 168 hours.  BNP: Invalid input(s): "POCBNP"  CBG: Recent Labs  Lab 12/11/22 2053  GLUCAP 125*     Microbiology: Results for orders placed or performed during the hospital encounter of 12/08/22  Resp panel by RT-PCR (RSV, Flu A&B, Covid) Anterior Nasal Swab     Status: None   Collection Time: 12/09/22 12:45 PM   Specimen: Anterior Nasal Swab  Result Value Ref Range Status  SARS Coronavirus 2 by RT PCR NEGATIVE NEGATIVE Final    Comment: (NOTE) SARS-CoV-2 target nucleic acids are NOT DETECTED.  The SARS-CoV-2 RNA is generally detectable in upper respiratory specimens during the acute phase of infection. The lowest concentration of SARS-CoV-2 viral copies this assay can detect is 138 copies/mL. A negative result does not preclude SARS-Cov-2 infection and should not be used as the sole basis for treatment or other patient management decisions. A negative result may occur with  improper specimen collection/handling, submission of specimen other than nasopharyngeal swab, presence of viral mutation(s) within the areas targeted by this assay, and inadequate number of viral copies(<138 copies/mL). A negative result must be combined with clinical observations, patient history, and epidemiological information. The expected result is Negative.  Fact  Sheet for Patients:  EntrepreneurPulse.com.au  Fact Sheet for Healthcare Providers:  IncredibleEmployment.be  This test is no t yet approved or cleared by the [place] FDA and  has been authorized for detection and/or diagnosis of SARS-CoV-2 by FDA under an Emergency Use Authorization (EUA). This EUA will remain  in effect (meaning this test can be used) for the duration of the COVID-19 declaration under Section 564(b)(1) of the Act, 21 U.S.C.section 360bbb-3(b)(1), unless the authorization is terminated  or revoked sooner.       Influenza A by PCR NEGATIVE NEGATIVE Final   Influenza B by PCR NEGATIVE NEGATIVE Final    Comment: (NOTE) The Xpert Xpress SARS-CoV-2/FLU/RSV plus assay is intended as an aid in the diagnosis of influenza from Nasopharyngeal swab specimens and should not be used as a sole basis for treatment. Nasal washings and aspirates are unacceptable for Xpert Xpress SARS-CoV-2/FLU/RSV testing.  Fact Sheet for Patients: EntrepreneurPulse.com.au  Fact Sheet for Healthcare Providers: IncredibleEmployment.be  This test is not yet approved or cleared by the [place] FDA and has been authorized for detection and/or diagnosis of SARS-CoV-2 by FDA under an Emergency Use Authorization (EUA). This EUA will remain in effect (meaning this test can be used) for the duration of the COVID-19 declaration under Section 564(b)(1) of the Act, 21 U.S.C. section 360bbb-3(b)(1), unless the authorization is terminated or revoked.     Resp Syncytial Virus by PCR NEGATIVE NEGATIVE Final    Comment: (NOTE) Fact Sheet for Patients: EntrepreneurPulse.com.au  Fact Sheet for Healthcare Providers: IncredibleEmployment.be  This test is not yet approved or cleared by the [place] FDA and has been authorized for detection and/or diagnosis of SARS-CoV-2 by FDA under an  Emergency Use Authorization (EUA). This EUA will remain in effect (meaning this test can be used) for the duration of the COVID-19 declaration under Section 564(b)(1) of the Act, 21 U.S.C. section 360bbb-3(b)(1), unless the authorization is terminated or revoked.  Performed at [facility], [place] [address] [place], [place] [address]     Coagulation Studies: No results for input(s): "LABPROT", "INR" in the last 72 hours.  Urinalysis: No results for input(s): "COLORURINE", "LABSPEC", "PHURINE", "GLUCOSEU", "HGBUR", "BILIRUBINUR", "KETONESUR", "PROTEINUR", "UROBILINOGEN", "NITRITE", "LEUKOCYTESUR" in the last 72 hours.  Invalid input(s): "APPERANCEUR"    Imaging: DG Elbow 2 Views Right  Result Date: 12/13/2022 CLINICAL DATA:  Right elbow pain EXAM: RIGHT ELBOW - 2 VIEW COMPARISON:  None Available. FINDINGS: There is no evidence of fracture, dislocation, or joint effusion. There is no evidence of arthropathy or other focal bone abnormality. Prominent circumferential soft tissue swelling. Atherosclerotic vascular calcification, advanced for age. Nonspecific soft tissue calcification along the dorsal aspect of the proximal forearm. IMPRESSION: 1. No  acute osseous abnormality of the right elbow. 2. Prominent circumferential soft tissue swelling. 3. Atherosclerotic vascular calcification, advanced for age. Electronically Signed   By: Davina Poke D.O.   On: 12/13/2022 11:30     Medications:    anticoagulant sodium citrate      acetaminophen  1,000 mg Oral Q8H   ARIPiprazole  7.5 mg Oral Daily   calcitRIOL  0.25 mcg Oral Q T,Th,Sa-HD   calcium acetate  667 mg Oral TID WC   carvedilol  6.25 mg Oral BID WC   Chlorhexidine Gluconate Cloth  6 each Topical Q0600   diclofenac Sodium  2 g Topical QID   doxycycline  100 mg Oral Q12H   DULoxetine  20 mg Oral Daily   gabapentin  100 mg Oral BID   heparin injection (subcutaneous)  5,000 Units Subcutaneous Q8H   hydrALAZINE  50 mg Oral  Q8H   isosorbide mononitrate  60 mg Oral Daily   sevelamer carbonate  1,600 mg Oral TID WC   torsemide  100 mg Oral q morning   alteplase, anticoagulant sodium citrate, lidocaine-prilocaine, magnesium hydroxide, ondansetron **OR** ondansetron (ZOFRAN) IV, mouth rinse, oxyCODONE, pentafluoroprop-tetrafluoroeth, traZODone  Assessment/ Plan:  [name] is a 27 y.o.  female with past medical conditions including CHF, hypertension, PTSD, borderline personality disorder, and end-stage renal disease on hemodialysis, who was admitted to [facility] on 12/09/2022 for  Symptomatic anemia [D64.9]  [facility] [place]/MWF/left aVF  End-stage renal disease on hemodialysis. Receiving dialysis treatment today, UF goal 2.5-3L as tolerated. Next treatment scheduled for Wednesday.  Renal navigator aware of patient and was notified that patient is in jeopardy of losing outpatient dialysis clinic clinic due to extended absence. **Patient terminated treatment early due to not being able to receive pain medications. HD RN states patient would not maintain BP cuff for scheduled monitoring.**  2. Anemia of chronic kidney disease Lab Results  Component Value Date   HGB 9.1 (L) 12/13/2022    Patient has received IV iron and 1 unit blood transfusion.  Patient admitted to hospitalist service. Occult stool negative.  Will order low dose EPO with dialysis   3. Secondary Hyperparathyroidism: with outpatient labs: PTH 886, phosphorus 8.0, calcium 9.6 on 10/30/2022.              Continue calcium acetate and sevelamer with meals to manage elevated phosphorus levels..   4.  Hypertension with chronic kidney disease.  Patient currently prescribed carvedilol, hydralazine, isosorbide, and torsemide.  Currently receiving these medications.  Blood pressure stable during dialysis, 142/83  5.  Hyperkalemia likely due to poorly monitored diet.  Potassium 5.4 after Lokelma yesterday. Encouraged patient to monitor potassium intake.  Receiving dialysis today, which will correct potassium.    LOS: 2   1/15/20249:23 AM
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
## 2022-12-14 NOTE — Plan of Care (Signed)
  Problem: Education: Goal: Knowledge of [facility] Education information/materials will improve Outcome: Not Progressing Goal: Emotional status will improve Outcome: Not Progressing Goal: Mental status will improve Outcome: Not Progressing Goal: Verbalization of understanding the information provided will improve Outcome: Not Progressing   Problem: Activity: Goal: Interest or engagement in activities will improve Outcome: Not Progressing Goal: Sleeping patterns will improve Outcome: Not Progressing   Problem: Coping: Goal: Ability to verbalize frustrations and anger appropriately will improve Outcome: Not Progressing Goal: Ability to demonstrate self-control will improve Outcome: Not Progressing   Problem: Health Behavior/Discharge Planning: Goal: Identification of resources available to assist in meeting health care needs will improve Outcome: Not Progressing Goal: Compliance with treatment plan for underlying cause of condition will improve Outcome: Not Progressing   Problem: Physical Regulation: Goal: Ability to maintain clinical measurements within normal limits will improve Outcome: Not Progressing   Problem: Safety: Goal: Periods of time without injury will increase Outcome: Not Progressing

## 2022-12-15 MED ORDER — HEPARIN SODIUM (PORCINE) 1000 UNIT/ML DIALYSIS: 1000.0000 [IU] | INTRAMUSCULAR | Status: DC | PRN

## 2022-12-15 MED ORDER — LIDOCAINE HCL (PF) 1 % IJ SOLN: 5.0000 mL | INTRAMUSCULAR | Status: DC | PRN

## 2022-12-15 MED ORDER — PHENYLEPHRINE HCL 0.25 % NA SOLN
2.0000 | NASAL | Status: AC | PRN
  Filled 2022-12-15: qty 15

## 2022-12-15 MED ORDER — GENTAMICIN SULFATE 0.1 % EX OINT
TOPICAL_OINTMENT | Freq: Three times a day (TID) | CUTANEOUS | Status: DC
  Filled 2022-12-15: qty 15

## 2022-12-15 MED ORDER — CHLORHEXIDINE GLUCONATE CLOTH 2 % EX PADS
6.0000 | MEDICATED_PAD | Freq: Every day | CUTANEOUS | Status: DC
  Administered 2022-12-20: 6 via TOPICAL

## 2022-12-15 NOTE — Group Note (Signed)
LCSW Group Therapy Note  Group Date: 12/15/2022 Start Time: 1300 End Time: 1400   Type of Therapy and Topic:  Group Therapy - Healthy vs Unhealthy Coping Skills  Participation Level:  Did Not Attend   Description of Group The focus of this group was to determine what unhealthy coping techniques typically are used by group members and what healthy coping techniques would be helpful in coping with various problems. Patients were guided in becoming aware of the differences between healthy and unhealthy coping techniques. Patients were asked to identify 2-3 healthy coping skills they would like to learn to use more effectively.  Therapeutic Goals Patients learned that coping is what human beings do all day long to deal with various situations in their lives Patients defined and discussed healthy vs unhealthy coping techniques Patients identified their preferred coping techniques and identified whether these were healthy or unhealthy Patients determined 2-3 healthy coping skills they would like to become more familiar with and use more often. Patients provided support and ideas to each other   Summary of Patient Progress:  Patient did not attend group despite encouraged participation.    Therapeutic Modalities Cognitive Behavioral Therapy Motivational Interviewing  Larose Kells 12/15/2022  2:36 PM

## 2022-12-15 NOTE — Progress Notes (Signed)
Recreation Therapy Notes   Date: 12/15/2022  Time: 10:15 am     Location: Craft room   Behavioral response: Appropriate  Intervention Topic:  Leisure    Discussion/Intervention:  Group content today was focused on leisure. The group defined what leisure is and some positive leisure activities they participate in. Individuals identified the difference between good and bad leisure. Participants expressed how they feel after participating in the leisure of their choice. The group discussed how they go about picking a leisure activity and if others are involved in their leisure activities. The patient stated how many leisure activities they have to choose from and reasons why it is important to have leisure time. Individuals participated in the intervention "Exploration of Leisure" where they had a chance to identify new leisure activities as well as benefits of leisure. Clinical Observations/Feedback: Patient came to group and was focused on what peers and staff had to say about leisure. She defined leisure as being comfortable while doing something that relaxes her. Participant identified music, drawing and coloring as leisure activities she enjoys. Individual was social with staff and peers while participating in the intervention.         Aariz Maish 12/15/2022 1:54 PM

## 2022-12-15 NOTE — Discharge Planning (Signed)
Natasha Long, [place] [address] [phone]  Scheduled Days: Monday Wednesday and Friday  Treatment time: 12:00pm  Tom RN, last treatment at clinic was 12/15 Per [facility] was planning to discharge patient from clinic on Wednesday 1/17, due to non-compliancy and not being seen in clinic for over 30 days. This would mean that at discharge from the hospital, patient will not have a dialysis center to go to. However, SW with clinic reached out and stated that they would be willing to accept patient back, if she shows up for treatment after discharge. If the patient does not show up, she will be discharged from the clinic. Per Joseph Art, they have been trying to reach patient regarding this situation, however due to be homeless, they have been unsuccessful in delivering a letter to inform of her risks of being discharged.

## 2022-12-15 NOTE — Progress Notes (Signed)
Patient calm and pleasant during assessment denying SI/HI/AVH. Pt endorses anxiety and depression. Pt more active on the unit tonight than when this writer had this patient last week. Pt compliant with medication administration per MD orders. Pt given education, support, and encouragement to be active in her treatment plan. Pt being monitored Q 15 minutes for safety per unit protocol, remains safe on the unit

## 2022-12-15 NOTE — Progress Notes (Signed)
[facility]  ROUNDING NOTE   Subjective:   Patient seen ambulating with walker States she's having lower back and left leg pain Nose Bleeds reported overnight.  Lower extremity edema remains  Objective:  Vital signs in last 24 hours:  Temp:  [98 F (36.7 C)-99.5 F (37.5 C)] 98.2 F (36.8 C) (01/15 1809) Pulse Rate:  [100-108] 108 (01/16 0509) Resp:  [16-24] 22 (01/16 0509) BP: (119-145)/(78-91) 142/78 (01/16 0509) SpO2:  [92 %-99 %] 95 % (01/16 0509) Weight:  [93 kg] 93 kg (01/15 1514)  Weight change:  Filed Weights   12/14/22 1514  Weight: 93 kg    Intake/Output: No intake/output data recorded.   Intake/Output this shift:  No intake/output data recorded.  Physical Exam: General: NAD  Head: Normocephalic, atraumatic. Moist oral mucosal membranes  Eyes: Anicteric  Lungs:  Clear to auscultation, normal effort, room air  Heart: Regular rate and rhythm  Abdomen:  Soft, nontender  Extremities: 1+ peripheral edema.  Neurologic: Alert and oriented, moving all four extremities  Skin: No lesions  Access: Left aVF    Basic Metabolic Panel: Recent Labs  Lab 12/08/22 1200 12/11/22 0859 12/12/22 0817 12/13/22 0724 12/14/22 0226  NA 136 136 137 136 136  K 3.9 4.8 4.7 5.5* 5.4*  CL 96* 101 102 101 101  CO2 26 21* 23 23 25   GLUCOSE 111* 129* 88 83 93  BUN 73* 93* 74* 91* 92*  CREATININE 6.08* 7.61* 5.93* 6.99* 7.81*  CALCIUM 9.0 9.2 9.4 9.4 9.2  MG  --   --   --  2.2 2.2  PHOS  --  6.1*  --  6.4* 6.3*     Liver Function Tests: Recent Labs  Lab 12/08/22 1200 12/11/22 0859 12/13/22 0724 12/14/22 0226  AST 43*  --   --   --   ALT 32  --   --   --   ALKPHOS 107  --   --   --   BILITOT 0.6  --   --   --   PROT 6.6  --   --   --   ALBUMIN 2.5* 2.6* 2.6* 2.6*    No results for input(s): "LIPASE", "AMYLASE" in the last 168 hours. No results for input(s): "AMMONIA" in the last 168 hours.  CBC: Recent Labs  Lab 12/08/22 1200 12/11/22 0859  12/12/22 0817 12/12/22 1619 12/13/22 0724  WBC 10.8* 6.8 6.5 7.3 7.4  NEUTROABS 8.8*  --   --   --   --   HGB 7.5* 6.6* 9.0* 8.9* 9.1*  HCT 25.2* 22.2* 29.0* 28.6* 29.1*  MCV 98.1 95.7 92.7 93.5 92.4  PLT 142* 124* 133* 134* 159     Cardiac Enzymes: No results for input(s): "CKTOTAL", "CKMB", "CKMBINDEX", "TROPONINI" in the last 168 hours.  BNP: Invalid input(s): "POCBNP"  CBG: Recent Labs  Lab 12/11/22 2053  GLUCAP 125*     Microbiology: Results for orders placed or performed during the hospital encounter of 12/09/22  MRSA Next Gen by PCR, Nasal     Status: None   Collection Time: 12/11/22 10:34 AM   Specimen: Nasal Mucosa; Nasal Swab  Result Value Ref Range Status   MRSA by PCR Next Gen NOT DETECTED NOT DETECTED Final    Comment: (NOTE) The GeneXpert MRSA Assay (FDA approved for NASAL specimens only), is one component of a comprehensive MRSA colonization surveillance program. It is not intended to diagnose MRSA infection nor to guide or monitor treatment for MRSA infections.  Test performance is not FDA approved in patients less than 55 years old. Performed at [facility], [address] [place], [place] [address]     Coagulation Studies: No results for input(s): "LABPROT", "INR" in the last 72 hours.  Urinalysis: No results for input(s): "COLORURINE", "LABSPEC", "PHURINE", "GLUCOSEU", "HGBUR", "BILIRUBINUR", "KETONESUR", "PROTEINUR", "UROBILINOGEN", "NITRITE", "LEUKOCYTESUR" in the last 72 hours.  Invalid input(s): "APPERANCEUR"    Imaging: DG Elbow 2 Views Right  Result Date: 12/13/2022 CLINICAL DATA:  Right elbow pain EXAM: RIGHT ELBOW - 2 VIEW COMPARISON:  None Available. FINDINGS: There is no evidence of fracture, dislocation, or joint effusion. There is no evidence of arthropathy or other focal bone abnormality. Prominent circumferential soft tissue swelling. Atherosclerotic vascular calcification, advanced for age. Nonspecific soft tissue  calcification along the dorsal aspect of the proximal forearm. IMPRESSION: 1. No acute osseous abnormality of the right elbow. 2. Prominent circumferential soft tissue swelling. 3. Atherosclerotic vascular calcification, advanced for age. Electronically Signed   By: Davina Poke D.O.   On: 12/13/2022 11:30     Medications:    anticoagulant sodium citrate      ARIPiprazole  7.5 mg Oral Daily   calcitRIOL  0.25 mcg Oral Q T,Th,Sa-HD   calcium acetate  667 mg Oral TID WC   carvedilol  6.25 mg Oral BID WC   Darbepoetin Alfa  40 mcg Intravenous Once   diclofenac Sodium  2 g Topical QID   doxycycline  100 mg Oral Q12H   DULoxetine  20 mg Oral Daily   gabapentin  100 mg Oral BID   gentamicin ointment   Topical TID   hydrALAZINE  50 mg Oral Q8H   isosorbide mononitrate  60 mg Oral Daily   sevelamer carbonate  1,600 mg Oral TID WC   torsemide  100 mg Oral q morning   acetaminophen, alteplase, alum & mag hydroxide-simeth, anticoagulant sodium citrate, lidocaine-prilocaine, magnesium hydroxide, oxyCODONE, pentafluoroprop-tetrafluoroeth, phenylephrine, traZODone  Assessment/ Plan:  [name] is a 27 y.o.  female with past medical conditions including CHF, hypertension, PTSD, borderline personality disorder, and end-stage renal disease on hemodialysis, who was admitted to [facility] on 12/09/2022 for  Severe recurrent major depression without psychotic features ([place]) [F33.2]  [facility] [place]/MWF/left aVF  End-stage renal disease on hemodialysis.   Dialysis received yesterday, UF 2.4L achieved. Next treatment scheduled for Wednesday.   2. Anemia of chronic kidney disease Lab Results  Component Value Date   HGB 9.1 (L) 12/13/2022    Patient has received IV iron and 1 unit blood transfusion.  Patient admitted to hospitalist service. Occult stool negative.  Nose bleed charted overnight. Continue EPO with dialysis.  3. Secondary Hyperparathyroidism: with outpatient labs: PTH 886,  phosphorus 8.0, calcium 9.6 on 10/30/2022.              Continue calcium acetate and sevelamer with meals.Will continue to monitor bone minerals during this admission.  4.  Hypertension with chronic kidney disease.  Patient currently prescribed carvedilol, hydralazine, isosorbide, and torsemide.  Currently receiving these medications.  Blood pressure acceptable for this patient.  5.  Hyperkalemia likely due to poorly monitored diet.  Potassium 5.4 after [place] yesterday. Encouraged patient to monitor potassium intake. Will obtain updated labs with dialysis tomorrow.    LOS: 1   1/16/202410:44 AM

## 2022-12-15 NOTE — Plan of Care (Signed)
D: Patient alert and oriented. Patient rates pain 8/10. Patient endorses anxiety and depression. Patient denies SI/HI/AVH.  After being told in report that patient had a nosebleed during the night, patient had a nosebleed this morning after breakfast. Patient compliant with 0800 scheduled medication. Patient isolative to room after lunch, sleeping in bed with rapid shallow breaths. Patient does open her eyes and talks a few words when staff attempts to talk to her but does not follow commands or continue a conversation, patient goes back to sleep after a few words. Patient refused scheduled 1200 medication stating she is going to take it later. MD notified.  Patient was given a wheelchair before dinner. Patient is looking better than she did after lunch. Patient is smiling and interacting and holding conversations with peers and staff. Patient was compliant with scheduled 1700 medication.   A: Scheduled medications administered to patient, per MD orders.  Support and encouragement provided to patient.  Q15 minute safety checks maintained.   R: Patient compliant with medication administration and treatment plan. No adverse drug reactions noted. Patient remains safe on the unit at this time. Problem: Education: Goal: Knowledge of [place] [facility] Education information/materials will improve Outcome: Progressing Goal: Verbalization of understanding the information provided will improve Outcome: Progressing   Problem: Health Behavior/Discharge Planning: Goal: Compliance with treatment plan for underlying cause of condition will improve Outcome: Progressing
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
## 2022-12-15 NOTE — Plan of Care (Signed)
Patient's ability to program and progress on the unit is complicated by medical concerns.  Patient returned from dialysis today, which she refused to complete, and began having nosebleeds around 8:30 PM.  Pt was educated to avoid blowing her nose, to pinch the bridge of her nose and use gauze/tissue to absorb until bleeding stopped.  [name] nosebleed resolved after about 45 minutes with the help of a cold pack.  She did begin having another nosebleed around 5 AM.  A Cold compress was again provided and bleed resolved quickly.  When sleeping/dozing thereafter, [name] respirations were 22-24 and shallow.  Staff offered to raise the head of patient's bed 40-45 degrees to lessen work of breathing.  Pt tolerated this well.  SpO2 was 95%, RR 22, BP 142/78, P 108.  Attending MD notified.  Continue to monitor via q 15 minute observations.

## 2022-12-15 NOTE — [facility] Group Notes (Signed)
[place] Group Notes:  (Nursing/MHT/Case Management/Adjunct)  Date:  12/15/2022  Time:  10:05 AM  Type of Therapy:   Community Meeting  Participation Level:  Did Not Attend    Adela Lank [facility] 12/15/2022, 10:05 AM

## 2022-12-15 NOTE — Progress Notes (Signed)
[facility] LCSW Progress Note   12/15/2022    3:06 PM  Natasha Long   [id number]   Type of Contact and Topic:  PSA Attempt #1  CSW attempted to completed PSA with patient. Patient unable to participate in interview. Appeared to be experiencing shallow rapid breaths. Nursing and Physician notified and evaluated patient. CSW will make additional attempts to complete assessment with patient at another time.    Signed:  Durenda Hurt, MSW, LCSWA, LCAS 12/15/2022 3:06 PM

## 2022-12-16 ENCOUNTER — Encounter: Payer: Self-pay | Admitting: Psychiatry

## 2022-12-16 LAB — CBC
HCT: 24.5 % — ABNORMAL LOW (ref 36.0–46.0)
Hemoglobin: 7.4 g/dL — ABNORMAL LOW (ref 12.0–15.0)
MCH: 28 pg (ref 26.0–34.0)
MCHC: 30.2 g/dL (ref 30.0–36.0)
MCV: 92.8 fL (ref 80.0–100.0)
Platelets: 171 10*3/uL (ref 150–400)
RBC: 2.64 MIL/uL — ABNORMAL LOW (ref 3.87–5.11)
RDW: 18.2 % — ABNORMAL HIGH (ref 11.5–15.5)
WBC: 8.3 10*3/uL (ref 4.0–10.5)
nRBC: 0 % (ref 0.0–0.2)

## 2022-12-16 LAB — RENAL FUNCTION PANEL
Albumin: 2.4 g/dL — ABNORMAL LOW (ref 3.5–5.0)
Anion gap: 12 (ref 5–15)
BUN: 81 mg/dL — ABNORMAL HIGH (ref 6–20)
CO2: 26 mmol/L (ref 22–32)
Calcium: 8.9 mg/dL (ref 8.9–10.3)
Chloride: 97 mmol/L — ABNORMAL LOW (ref 98–111)
Creatinine, Ser: 5.82 mg/dL — ABNORMAL HIGH (ref 0.44–1.00)
GFR, Estimated: 10 mL/min — ABNORMAL LOW (ref >60)
Glucose, Bld: 121 mg/dL — ABNORMAL HIGH (ref 70–99)
Phosphorus: 5 mg/dL — ABNORMAL HIGH (ref 2.5–4.6)
Potassium: 4.6 mmol/L (ref 3.5–5.1)
Sodium: 135 mmol/L (ref 135–145)

## 2022-12-16 MED ORDER — OXYCODONE HCL 5 MG PO TABS
ORAL_TABLET | ORAL | Status: AC
  Administered 2022-12-16: 5 mg via ORAL
  Filled 2022-12-16: qty 1

## 2022-12-16 MED ORDER — ANTICOAGULANT SODIUM CITRATE 4% (200MG/5ML) IV SOLN: 5.0000 mL | Status: DC | PRN

## 2022-12-16 MED ORDER — PENTAFLUOROPROP-TETRAFLUOROETH EX AERO: 1.0000 | INHALATION_SPRAY | CUTANEOUS | Status: DC | PRN

## 2022-12-16 MED ORDER — ACETAMINOPHEN 325 MG PO TABS
ORAL_TABLET | ORAL | Status: AC
  Filled 2022-12-16: qty 2

## 2022-12-16 MED ORDER — GABAPENTIN 100 MG PO CAPS
100.0000 mg | ORAL_CAPSULE | Freq: Once | ORAL | Status: AC
  Administered 2022-12-16: 100 mg via ORAL
  Filled 2022-12-16: qty 1

## 2022-12-16 MED ORDER — ALTEPLASE 2 MG IJ SOLR: 2.0000 mg | Freq: Once | INTRAMUSCULAR | Status: DC | PRN

## 2022-12-16 MED ORDER — LIDOCAINE-PRILOCAINE 2.5-2.5 % EX CREA: 1.0000 | TOPICAL_CREAM | CUTANEOUS | Status: DC | PRN

## 2022-12-16 MED ORDER — HEPARIN SODIUM (PORCINE) 1000 UNIT/ML DIALYSIS: 1000.0000 [IU] | INTRAMUSCULAR | Status: DC | PRN

## 2022-12-16 MED ORDER — CEFTRIAXONE SODIUM 250 MG IJ SOLR
250.0000 mg | Freq: Once | INTRAMUSCULAR | Status: DC
  Filled 2022-12-16: qty 250

## 2022-12-16 MED ORDER — PENTAFLUOROPROP-TETRAFLUOROETH EX AERO
INHALATION_SPRAY | CUTANEOUS | Status: AC
  Filled 2022-12-16: qty 30

## 2022-12-16 MED ORDER — HEPARIN SODIUM (PORCINE) 1000 UNIT/ML DIALYSIS: 5000.0000 [IU] | Freq: Once | INTRAMUSCULAR | Status: DC

## 2022-12-16 MED ORDER — EPOETIN ALFA 10000 UNIT/ML IJ SOLN
INTRAMUSCULAR | Status: AC
  Administered 2022-12-16: 10000 [IU] via INTRAVENOUS
  Filled 2022-12-16: qty 1

## 2022-12-16 MED ORDER — LIDOCAINE HCL (PF) 1 % IJ SOLN: 5.0000 mL | INTRAMUSCULAR | Status: DC | PRN

## 2022-12-16 MED ORDER — AZITHROMYCIN 500 MG PO TABS
1000.0000 mg | ORAL_TABLET | Freq: Once | ORAL | Status: AC
  Administered 2022-12-16: 1000 mg via ORAL
  Filled 2022-12-16: qty 2

## 2022-12-16 MED ORDER — EPOETIN ALFA 10000 UNIT/ML IJ SOLN
10000.0000 [IU] | INTRAMUSCULAR | Status: DC
  Administered 2022-12-18 – 2022-12-21 (×2): 10000 [IU] via INTRAVENOUS
  Filled 2022-12-16 (×2): qty 1

## 2022-12-16 NOTE — BH IP Treatment Plan (Signed)
Interdisciplinary Treatment and Diagnostic Plan Update  12/16/2022 Time of Session: [place] MRN: [id number]  Principal Diagnosis: Severe recurrent major depression without psychotic features ([place])  Secondary Diagnoses: Principal Problem:   Severe recurrent major depression without psychotic features ([place]) Active Problems:   PTSD (post-traumatic stress disorder)   HTN (hypertension)   Borderline personality disorder ([place])   Moderate cannabis use disorder ([place])   ESRD (end stage renal disease) ([place])   Acute on chronic diastolic CHF (congestive heart failure) (HCC)   Chronic back pain   Homelessness   Current Medications:  Current Facility-Administered Medications  Medication Dose Route Frequency Provider Last Rate Last Admin   acetaminophen (TYLENOL) 325 MG tablet            acetaminophen (TYLENOL) tablet 650 mg  650 mg Oral Q6H PRN Clapacs, John T, MD   650 mg at 12/16/22 1254   alteplase (CATHFLO ACTIVASE) injection 2 mg  2 mg Intracatheter Once PRN Clapacs, Madie Reno, MD       alteplase (CATHFLO ACTIVASE) injection 2 mg  2 mg Intracatheter Once PRN Dwana Melena, MD       alum & mag hydroxide-simeth (MAALOX/MYLANTA) 200-200-20 MG/5ML suspension 30 mL  30 mL Oral Q4H PRN Clapacs, John T, MD       anticoagulant sodium citrate solution 5 mL  5 mL Intracatheter PRN Clapacs, John T, MD       anticoagulant sodium citrate solution 5 mL  5 mL Intracatheter PRN Dwana Melena, MD       ARIPiprazole (ABILIFY) tablet 7.5 mg  7.5 mg Oral Daily Clapacs, John T, MD   7.5 mg at 12/16/22 1421   calcitRIOL (ROCALTROL) capsule 0.25 mcg  0.25 mcg Oral Q T,Th,Sa-HD Clapacs, John T, MD       calcium acetate (PHOSLO) capsule 667 mg  667 mg Oral TID WC Clapacs, John T, MD   667 mg at 12/16/22 1421   carvedilol (COREG) tablet 6.25 mg  6.25 mg Oral BID WC Clapacs, John T, MD   6.25 mg at 12/16/22 1421   Chlorhexidine Gluconate Cloth 2 % PADS 6 each  6 each Topical Q0600 Breeze, Shantelle, NP        diclofenac Sodium (VOLTAREN) 1 % topical gel 2 g  2 g Topical QID Clapacs, John T, MD       DULoxetine (CYMBALTA) DR capsule 20 mg  20 mg Oral Daily Clapacs, John T, MD   20 mg at 12/16/22 1421   epoetin alfa (EPOGEN) injection 10,000 Units  10,000 Units Intravenous Q M,W,F-HD Colon Flattery, NP   10,000 Units at 12/16/22 1036   gentamicin ointment (GARAMYCIN) 0.1 %   Topical TID Clapacs, Madie Reno, MD   Given at 12/16/22 1420   heparin injection 1,000 Units  1,000 Units Intracatheter PRN Colon Flattery, NP       heparin injection 1,000 Units  1,000 Units Intracatheter PRN Dwana Melena, MD       heparin injection 5,000 Units  5,000 Units Dialysis Once in dialysis Dwana Melena, MD       hydrALAZINE (APRESOLINE) tablet 50 mg  50 mg Oral Q8H Clapacs, John T, MD   50 mg at 12/16/22 1421   isosorbide mononitrate (IMDUR) 24 hr tablet 60 mg  60 mg Oral Daily Clapacs, John T, MD   60 mg at 12/16/22 1421   lidocaine (PF) (XYLOCAINE) 1 % injection 5 mL  5 mL Intradermal PRN Colon Flattery, NP  lidocaine (PF) (XYLOCAINE) 1 % injection 5 mL  5 mL Intradermal PRN Dwana Melena, MD       lidocaine-prilocaine (EMLA) cream 1 Application  1 Application Topical PRN Clapacs, Madie Reno, MD       lidocaine-prilocaine (EMLA) cream 1 Application  1 Application Topical PRN Dwana Melena, MD       magnesium hydroxide (MILK OF MAGNESIA) suspension 30 mL  30 mL Oral Daily PRN Clapacs, Madie Reno, MD       oxyCODONE (Oxy IR/ROXICODONE) immediate release tablet 5 mg  5 mg Oral Q8H PRN Clapacs, Madie Reno, MD   5 mg at 12/16/22 1254   pentafluoroprop-tetrafluoroeth (GEBAUERS) aerosol 1 Application  1 Application Topical PRN Clapacs, Madie Reno, MD       pentafluoroprop-tetrafluoroeth (GEBAUERS) aerosol 1 Application  1 Application Topical PRN Dwana Melena, MD       pentafluoroprop-tetrafluoroeth (GEBAUERS) aerosol            phenylephrine (NEO-SYNEPHRINE) 0.25 % nasal spray 2 spray  2 spray Each Nare Q4H PRN Clapacs, Madie Reno, MD        sevelamer carbonate (RENVELA) tablet 1,600 mg  1,600 mg Oral TID WC Clapacs, John T, MD   1,600 mg at 12/16/22 1420   torsemide (DEMADEX) tablet 100 mg  100 mg Oral q morning Clapacs, John T, MD   100 mg at 12/16/22 1421   traZODone (DESYREL) tablet 25 mg  25 mg Oral QHS PRN Clapacs, Madie Reno, MD   25 mg at 12/15/22 2110   PTA Medications: Medications Prior to Admission  Medication Sig Dispense Refill Last Dose   acetaminophen (TYLENOL) 500 MG tablet Take 2 tablets (1,000 mg total) by mouth every 8 (eight) hours for 3 days, THEN 2 tablets (1,000 mg total) every 8 (eight) hours as needed for up to 5 days. 30 tablet 0    alteplase (CATHFLO ACTIVASE) 2 MG injection 2 mg by Intracatheter route once as needed for open catheter. 1 each     anticoagulant sodium citrate 4 GM/100ML SOLN 5 mLs by Intracatheter route as needed (in dialysis, If TTP or HIT.).      ARIPiprazole (ABILIFY) 5 MG tablet Take 1.5 tablets (7.5 mg total) by mouth daily. 45 tablet 1    calcitRIOL (ROCALTROL) 0.25 MCG capsule Take 1 capsule (0.25 mcg total) by mouth Every Tuesday,Thursday,and Saturday with dialysis. 30 capsule 0    calcium acetate (PHOSLO) 667 MG capsule Take 1 capsule (667 mg total) by mouth 3 (three) times daily with meals. 90 capsule 3    carvedilol (COREG) 6.25 MG tablet Take 1 tablet (6.25 mg total) by mouth 2 (two) times daily with a meal. 60 tablet 0    Chlorhexidine Gluconate Cloth 2 % PADS Apply 6 each topically daily at 6 (six) AM.      Darbepoetin Alfa (ARANESP) 200 MCG/0.4ML SOSY injection Inject 0.4 mLs (200 mcg total) into the skin every Wednesday at 6 PM. 1.68 mL     doxycycline (VIBRA-TABS) 100 MG tablet Take 1 tablet (100 mg total) by mouth every 12 (twelve) hours for 7 days. 14 tablet 0    DULoxetine (CYMBALTA) 20 MG capsule Take 1 capsule (20 mg total) by mouth daily.  3    gabapentin (NEURONTIN) 100 MG capsule Take 1 capsule (100 mg total) by mouth 2 (two) times daily.      heparin 1000 unit/mL  SOLN injection 1 mL (1,000 Units total) by Intracatheter route as needed (in dialysis).  hydrALAZINE (APRESOLINE) 50 MG tablet Take 1 tablet (50 mg total) by mouth every 8 (eight) hours. 90 tablet 0    isosorbide mononitrate (IMDUR) 60 MG 24 hr tablet Take 1 tablet (60 mg total) by mouth daily. 30 tablet 0    lidocaine-prilocaine (EMLA) cream Apply 1 Application topically as needed (topical anesthesia for hemodialysis if Gebauers and Lidocaine injection are ineffective.). 30 g 0    pentafluoroprop-tetrafluoroeth (GEBAUERS) AERO Apply 1 Application topically as needed (topical anesthesia for hemodialysis).  0    sevelamer carbonate (RENVELA) 800 MG tablet Take 2 tablets (1,600 mg total) by mouth 3 (three) times daily with meals. 180 tablet 0    torsemide (DEMADEX) 100 MG tablet Take 1 tablet (100 mg total) by mouth every morning. 30 tablet 3     Patient Stressors: Educational concerns   Financial difficulties   Health problems   Legal issue   Loss of family support   Marital or family conflict   Medication change or noncompliance   Occupational concerns   Substance abuse   Traumatic event    Patient Strengths: Ability for insight  Average or above average intelligence  Communication skills   Treatment Modalities: Medication Management, Group therapy, Case management,  1 to 1 session with clinician, Psychoeducation, Recreational therapy.   Physician Treatment Plan for Primary Diagnosis: Severe recurrent major depression without psychotic features ([place]) Long Term Goal(s): Improvement in symptoms so as ready for discharge   Short Term Goals: Ability to maintain clinical measurements within normal limits will improve Compliance with prescribed medications will improve Ability to verbalize feelings will improve Ability to disclose and discuss suicidal ideas Ability to demonstrate self-control will improve  Medication Management: Evaluate patient's response, side effects, and  tolerance of medication regimen.  Therapeutic Interventions: 1 to 1 sessions, Unit Group sessions and Medication administration.  Evaluation of Outcomes: Progressing  Physician Treatment Plan for Secondary Diagnosis: Principal Problem:   Severe recurrent major depression without psychotic features ([place]) Active Problems:   PTSD (post-traumatic stress disorder)   HTN (hypertension)   Borderline personality disorder ([facility])   Moderate cannabis use disorder ([facility])   ESRD (end stage renal disease) ([facility])   Acute on chronic diastolic CHF (congestive heart failure) ([facility])   Chronic back pain   Homelessness  Long Term Goal(s): Improvement in symptoms so as ready for discharge   Short Term Goals: Ability to maintain clinical measurements within normal limits will improve Compliance with prescribed medications will improve Ability to verbalize feelings will improve Ability to disclose and discuss suicidal ideas Ability to demonstrate self-control will improve     Medication Management: Evaluate patient's response, side effects, and tolerance of medication regimen.  Therapeutic Interventions: 1 to 1 sessions, Unit Group sessions and Medication administration.  Evaluation of Outcomes: Progressing   RN Treatment Plan for Primary Diagnosis: Severe recurrent major depression without psychotic features ([place]) Long Term Goal(s): Knowledge of disease and therapeutic regimen to maintain health will improve  Short Term Goals: Ability to remain free from injury will improve, Ability to verbalize frustration and anger appropriately will improve, Ability to demonstrate self-control, Ability to participate in decision making will improve, Ability to verbalize feelings will improve, Ability to disclose and discuss suicidal ideas, Ability to identify and develop effective coping behaviors will improve, and Compliance with prescribed medications will improve  Medication Management: RN will administer  medications as ordered by provider, will assess and evaluate patient's response and provide education to patient for prescribed medication. RN will report any  adverse and/or side effects to prescribing provider.  Therapeutic Interventions: 1 on 1 counseling sessions, Psychoeducation, Medication administration, Evaluate responses to treatment, Monitor vital signs and CBGs as ordered, Perform/monitor CIWA, COWS, AIMS and Fall Risk screenings as ordered, Perform wound care treatments as ordered.  Evaluation of Outcomes: Progressing   LCSW Treatment Plan for Primary Diagnosis: Severe recurrent major depression without psychotic features ([place]) Long Term Goal(s): Safe transition to appropriate next level of care at discharge, Engage patient in therapeutic group addressing interpersonal concerns.  Short Term Goals: Engage patient in aftercare planning with referrals and resources, Increase social support, Increase ability to appropriately verbalize feelings, Increase emotional regulation, Facilitate acceptance of mental health diagnosis and concerns, Facilitate patient progression through stages of change regarding substance use diagnoses and concerns, Identify triggers associated with mental health/substance abuse issues, and Increase skills for wellness and recovery  Therapeutic Interventions: Assess for all discharge needs, 1 to 1 time with Social worker, Explore available resources and support systems, Assess for adequacy in community support network, Educate family and significant other(s) on suicide prevention, Complete Psychosocial Assessment, Interpersonal group therapy.  Evaluation of Outcomes: Progressing   Progress in Treatment: Attending groups: Yes. Participating in groups: Yes. Taking medication as prescribed: Yes. Toleration medication: Yes. Family/Significant other contact made: No, will contact:  patient declined  Patient understands diagnosis: Yes. Discussing patient identified  problems/goals with staff: Yes. Medical problems stabilized or resolved: Yes. Denies suicidal/homicidal ideation: No. Issues/concerns per patient self-inventory: Yes. Other: none  New problem(s) identified: No, Describe:  none  New Short Term/Long Term Goal(s):Patient to work towards detox, medication management for mood stabilization; elimination of SI thoughts; development of comprehensive mental wellness/sobriety plan.  Patient Goals:  Patient states their goal for treatment is to "housing, pain, and drugs."  Discharge Plan or Barriers: No psychosocial barriers identified at this time, patient to return to place of residence when appropriate for discharge.   Reason for Continuation of Hospitalization: Depression Medication stabilization Suicidal ideation  Estimated Length of Stay:  Last 3 [place] Suicide Severity Risk Score: Flowsheet Row Admission (Current) from 12/14/2022 in [facility] Admission (Discharged) from 12/11/2022 in [facility] Admission (Discharged) from 12/09/2022 in [facility] Moderate Risk Moderate Risk Low Risk       Last PHQ 2/9 Scores:    08/11/2021    3:14 PM 12/30/2020   12:27 PM 12/27/2020    1:33 PM  Depression screen PHQ 2/9  Decreased Interest 3 3 3   Down, Depressed, Hopeless 3 3 3   PHQ - 2 Score 6 6 6   Altered sleeping 2 3 3   Tired, decreased energy 3 3 3   Change in appetite 1 2 3   Feeling bad or failure about yourself  3 3 3   Trouble concentrating 3 3 3   Moving slowly or fidgety/restless 0 0 1  Suicidal thoughts 3 2 2   PHQ-9 Score 21 22 24     Scribe for Treatment Team: Larose Kells 12/16/2022 3:41 PM

## 2022-12-16 NOTE — Plan of Care (Signed)
D: Patient alert and oriented. Patient endorses pain. Patient endorses anxiety and depression. Patient denies SI/HI/AVH. Patient had dialysis today. Patient complained of fatigue after returning from dialysis. Patient returned back to the unit and ate lunch. After lunch patient went to group and scheduled medications that were due during dialysis were given.  Scheduled 1700 medication is on hold until 2000 per MD.  Since dinner patient has been isolative to room.  A: Scheduled medications administered to patient, per MD orders.  Support and encouragement provided to patient.  Q15 minute safety checks maintained.   R: Patient compliant with medication administration and treatment plan. No adverse drug reactions noted. Patient remains safe on the unit at this time. Problem: Education: Goal: Knowledge of [facility] Education information/materials will improve Outcome: Progressing Goal: Verbalization of understanding the information provided will improve Outcome: Progressing   Problem: Health Behavior/Discharge Planning: Goal: Compliance with treatment plan for underlying cause of condition will improve Outcome: Progressing   Problem: Physical Regulation: Goal: Ability to maintain clinical measurements within normal limits will improve Outcome: Progressing

## 2022-12-16 NOTE — Progress Notes (Signed)
Recreation Therapy Notes  Date: 12/16/2022   Time: 10:25 am   Location: Craft room      Behavioral response: N/A   Intervention Topic: Stress Management    Discussion/Intervention: Patient unable to attend group.    Clinical Observations/Feedback:  Patient unable to attend group.    Ilamae Geng LRT/CTRS        Delfina Schreurs 12/16/2022 12:05 PM
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
## 2022-12-16 NOTE — BHH Suicide Risk Assessment (Signed)
[facility] INPATIENT:  Family/Significant Other Suicide Prevention Education  Suicide Prevention Education:  Patient Refusal for Family/Significant Other Suicide Prevention Education: The patient Natasha Long has refused to provide written consent for family/significant other to be provided Family/Significant Other Suicide Prevention Education during admission and/or prior to discharge.  Physician notified.  Durenda Hurt 12/16/2022, 3:40 PM

## 2022-12-16 NOTE — Progress Notes (Addendum)
[facility]  ROUNDING NOTE   Subjective:   Patient seen and evaluated during dialysis   HEMODIALYSIS FLOWSHEET:  Blood Flow Rate (mL/min): 400 mL/min Arterial Pressure (mmHg): -130 mmHg Venous Pressure (mmHg): 230 mmHg TMP (mmHg): 15 mmHg Ultrafiltration Rate (mL/min): 1000 mL/min Dialysate Flow Rate (mL/min): 300 ml/min Dialysis Fluid Bolus: Normal Saline  No complaints at this time.   Objective:  Vital signs in last 24 hours:  Temp:  [98.3 F (36.8 C)-98.6 F (37 C)] 98.3 F (36.8 C) (01/17 0841) Pulse Rate:  [102-105] 104 (01/17 1000) Resp:  [18-25] 22 (01/17 1000) BP: (124-139)/(57-85) 130/71 (01/17 1000) SpO2:  [89 %-100 %] 98 % (01/17 1000)  Weight change:  Filed Weights   12/14/22 1514  Weight: 93 kg    Intake/Output: No intake/output data recorded.   Intake/Output this shift:  No intake/output data recorded.  Physical Exam: General: NAD  Head: Normocephalic, atraumatic. Moist oral mucosal membranes  Eyes: Anicteric  Lungs:  Clear to auscultation, normal effort, room air  Heart: Regular rate and rhythm  Abdomen:  Soft, nontender  Extremities: 1+ peripheral edema.  Neurologic: Alert and oriented, moving all four extremities  Skin: No lesions  Access: Left aVF    Basic Metabolic Panel: Recent Labs  Lab 12/11/22 0859 12/12/22 0817 12/13/22 0724 12/14/22 0226 12/16/22 0931  NA 136 137 136 136 135  K 4.8 4.7 5.5* 5.4* 4.6  CL 101 102 101 101 97*  CO2 21* 23 23 25 26   GLUCOSE 129* 88 83 93 121*  BUN 93* 74* 91* 92* 81*  CREATININE 7.61* 5.93* 6.99* 7.81* 5.82*  CALCIUM 9.2 9.4 9.4 9.2 8.9  MG  --   --  2.2 2.2  --   PHOS 6.1*  --  6.4* 6.3* 5.0*     Liver Function Tests: Recent Labs  Lab 12/11/22 0859 12/13/22 0724 12/14/22 0226 12/16/22 0931  ALBUMIN 2.6* 2.6* 2.6* 2.4*    No results for input(s): "LIPASE", "AMYLASE" in the last 168 hours. No results for input(s): "AMMONIA" in the last 168 hours.  CBC: Recent  Labs  Lab 12/11/22 0859 12/12/22 0817 12/12/22 1619 12/13/22 0724 12/16/22 0931  WBC 6.8 6.5 7.3 7.4 8.3  HGB 6.6* 9.0* 8.9* 9.1* 7.4*  HCT 22.2* 29.0* 28.6* 29.1* 24.5*  MCV 95.7 92.7 93.5 92.4 92.8  PLT 124* 133* 134* 159 171     Cardiac Enzymes: No results for input(s): "CKTOTAL", "CKMB", "CKMBINDEX", "TROPONINI" in the last 168 hours.  BNP: Invalid input(s): "POCBNP"  CBG: Recent Labs  Lab 12/11/22 2053  GLUCAP 125*     Microbiology: Results for orders placed or performed during the hospital encounter of 12/09/22  MRSA Next Gen by PCR, Nasal     Status: None   Collection Time: 12/11/22 10:34 AM   Specimen: Nasal Mucosa; Nasal Swab  Result Value Ref Range Status   MRSA by PCR Next Gen NOT DETECTED NOT DETECTED Final    Comment: (NOTE) The GeneXpert MRSA Assay (FDA approved for NASAL specimens only), is one component of a comprehensive MRSA colonization surveillance program. It is not intended to diagnose MRSA infection nor to guide or monitor treatment for MRSA infections. Test performance is not FDA approved in patients less than 27 years old. Performed at [facility], [address] [place], [place] [address]     Coagulation Studies: No results for input(s): "LABPROT", "INR" in the last 72 hours.  Urinalysis: No results for input(s): "COLORURINE", "LABSPEC", "PHURINE", "GLUCOSEU", "HGBUR", "BILIRUBINUR", "  KETONESUR", "PROTEINUR", "UROBILINOGEN", "NITRITE", "LEUKOCYTESUR" in the last 72 hours.  Invalid input(s): "APPERANCEUR"    Imaging: No results found.   Medications:    anticoagulant sodium citrate     anticoagulant sodium citrate      pentafluoroprop-tetrafluoroeth       ARIPiprazole  7.5 mg Oral Daily   calcitRIOL  0.25 mcg Oral Q T,Th,Sa-HD   calcium acetate  667 mg Oral TID WC   carvedilol  6.25 mg Oral BID WC   Chlorhexidine Gluconate Cloth  6 each Topical Q0600   Darbepoetin Alfa  40 mcg Intravenous Once   diclofenac  Sodium  2 g Topical QID   doxycycline  100 mg Oral Q12H   DULoxetine  20 mg Oral Daily   gentamicin ointment   Topical TID   heparin  5,000 Units Dialysis Once in dialysis   hydrALAZINE  50 mg Oral Q8H   isosorbide mononitrate  60 mg Oral Daily   sevelamer carbonate  1,600 mg Oral TID WC   torsemide  100 mg Oral q morning   pentafluoroprop-tetrafluoroeth, acetaminophen, alteplase, alteplase, alum & mag hydroxide-simeth, anticoagulant sodium citrate, anticoagulant sodium citrate, heparin, heparin, lidocaine (PF), lidocaine (PF), lidocaine-prilocaine, lidocaine-prilocaine, magnesium hydroxide, oxyCODONE, pentafluoroprop-tetrafluoroeth, pentafluoroprop-tetrafluoroeth, phenylephrine, traZODone  Assessment/ Plan:  [name] is a 27 y.o.  female with past medical conditions including CHF, hypertension, PTSD, borderline personality disorder, and end-stage renal disease on hemodialysis, who was admitted to [facility] on 12/09/2022 for  Severe recurrent major depression without psychotic features ([place]) [F33.2]  [facility]/MWF/left aVF  End-stage renal disease on hemodialysis.   Receiving dialysis today, UF goal 2.5-3L as tolerated. Will provide patient with UF only treatment tomorrow for additional fluid removal.    2. Anemia of chronic kidney disease Lab Results  Component Value Date   HGB 7.4 (L) 12/16/2022    Hgb remains decreased.  Continue EPO with dialysis.  3. Secondary Hyperparathyroidism: with outpatient labs: PTH 886, phosphorus 8.0, calcium 9.6 on 10/30/2022.              Continue calcium acetate and sevelamer with meals.  4.  Hypertension with chronic kidney disease.  Patient currently prescribed carvedilol, hydralazine, isosorbide, and torsemide.  Currently receiving these medications.  Blood pressure 125/71 during dialysis.  5.  Hyperkalemia likely due to poorly monitored diet.  Potassium acceptable.    LOS: 2   1/17/202410:20 AM

## 2022-12-16 NOTE — Progress Notes (Signed)
Patient calm and pleasant during assessment denying SI/HI/AVH. Pt endorses anxiety and depression. Pt more active on the unit tonight than when this writer had this patient last week. Pt compliant with medication administration per MD orders. Pt given education, support, and encouragement to be active in her treatment plan. Pt being monitored Q 15 minutes for safety per unit protocol, remains safe on the unit

## 2022-12-16 NOTE — Progress Notes (Signed)
Pt did 3.5 hrs of HD, tolerated well.  UF = 3000 ml    12/16/22 1233  Vitals  Temp 98 F (36.7 C)  Temp Source Oral  BP (!) 136/103  MAP (mmHg) 113  BP Location Right Wrist  BP Method Automatic  Patient Position (if appropriate) Sitting  Pulse Rate (!) 104  Pulse Rate Source Monitor  ECG Heart Rate (!) 106  Resp (!) 24  Oxygen Therapy  SpO2 100 %  O2 Device Room Air  Patient Activity (if Appropriate) In chair  Pulse Oximetry Type Continuous  During Treatment Monitoring  Intra-Hemodialysis Comments Tx completed;Tolerated well  Post Treatment  Dialyzer Clearance Lightly streaked  Duration of HD Treatment -hour(s) 3.5 hour(s)  Hemodialysis Intake (mL) 0 mL  Liters Processed 84  Fluid Removed (mL) 3000 mL  Tolerated HD Treatment Yes  Post-Hemodialysis Comments no issues, tolerated well  AVG/AVF Arterial Site Held (minutes) 5 minutes  AVG/AVF Venous Site Held (minutes) 5 minutes  Note  Observations pt asleep  Fistula / Graft Left Upper arm  No placement date or time found.   Orientation: Left  Access Location: Upper arm  Site Condition No complications  Fistula / Graft Assessment Present;Thrill;Bruit  Status Deaccessed  Drainage Description None

## 2022-12-16 NOTE — Progress Notes (Signed)
[facility] MD Progress Note  12/16/2022 2:02 PM Natasha Long  MRN:  [id number] Subjective: Patient seen for follow-up.  Had dialysis this morning.  Appears to have had a more successful dialysis session than last time with improved lab work.  Patient feeling fatigued but no new behavior problems. Principal Problem: Severe recurrent major depression without psychotic features ([place]) Diagnosis: Principal Problem:   Severe recurrent major depression without psychotic features ([place]) Active Problems:   PTSD (post-traumatic stress disorder)   HTN (hypertension)   Borderline personality disorder (HCC)   Moderate cannabis use disorder (HCC)   ESRD (end stage renal disease) (HCC)   Acute on chronic diastolic CHF (congestive heart failure) (HCC)   Chronic back pain   Homelessness  Total Time spent with patient: 30 minutes  Past Psychiatric History: Past history of depression and PTSD previous suicide attempt substance abuse  Past Medical History:  Past Medical History:  Diagnosis Date   Asthma    as a child, no problem as an adult, no inhaler   CHF (congestive heart failure) (HCC)    Complication of anesthesia    woke up before tube removed, 1 time fought nurses   ESRD on hemodialysis ([place])    M-W-F   History of borderline personality disorder    Hypertension    diagnosed as child; stopped meds at 58 yo   Insomnia    Neuromuscular disorder ([place])    peripheral neuropathy   Nonspecific chest pain    PTSD (post-traumatic stress disorder)     Past Surgical History:  Procedure Laterality Date   AV FISTULA PLACEMENT Left 10/18/2020   Procedure: LEFT ARM ARTERIOVENOUS (AV) FISTULA CREATION;  Surgeon: Serafina Mitchell, MD;  Location: [facility];  Service: Vascular;  Laterality: Left;   CHOLECYSTECTOMY     extraction of wisdom teeth     FISTULA SUPERFICIALIZATION Left 02/13/2021   Procedure: LEFT BRACHIOCEPHALIC ARTERIOVENOUS FISTULA SUPERFICIALIZATION;  Surgeon: Serafina Mitchell, MD;  Location:  [place];  Service: Vascular;  Laterality: Left;   I & D EXTREMITY Left 01/30/2022   Procedure: IRRIGATION AND DEBRIDEMENT EXTREMITY;  Surgeon: Milly Jakob, MD;  Location: [place];  Service: Orthopedics;  Laterality: Left;   INCISION AND DRAINAGE OF WOUND Left 01/30/2022   Procedure: LEFT WRIST ASPIRATION;  Surgeon: Vanetta Mulders, MD;  Location: [place];  Service: Orthopedics;  Laterality: Left;   KNEE ARTHROSCOPY Right 01/30/2022   Procedure: ARTHROSCOPY KNEE AND IRRIIGATION AND DEBRIDMENT; LEFT WRIST ASPIRATION;  Surgeon: Vanetta Mulders, MD;  Location: [place];  Service: Orthopedics;  Laterality: Right;   RENAL BIOPSY     x 2   TUNNELED VENOUS CATHETER PLACEMENT  02/11/2021   [facility]   Family History:  Family History  Adopted: Yes  Problem Relation Age of Onset   Diabetes Other    Hypertension Other    Family Psychiatric  History: See previous Social History:  Social History   Substance and Sexual Activity  Alcohol Use Not Currently   Comment: "maybe 3 a month."- liquor     Social History   Substance and Sexual Activity  Drug Use Yes   Types: Marijuana    Social History   Socioeconomic History   Marital status: Soil scientist    Spouse name: Not on file   Number of children: 1   Years of education: Not on file   Highest education level: 12th grade  Occupational History   Occupation: unemployed  Tobacco Use   Smoking status: Every Day  Packs/day: 0.50    Years: 10.00    Total pack years: 5.00    Types: Cigarettes   Smokeless tobacco: Never  Vaping Use   Vaping Use: Some days  Substance and Sexual Activity   Alcohol use: Not Currently    Comment: "maybe 3 a month."- liquor   Drug use: Yes    Types: Marijuana   Sexual activity: Not Currently    Birth control/protection: None  Other Topics Concern   Not on file  Social History Narrative   Not on file   Social Determinants of Health   Financial Resource Strain: Unknown (01/13/2019)   Overall  Financial Resource Strain (CARDIA)    Difficulty of Paying Living Expenses: Patient refused  Food Insecurity: Food Insecurity Present (12/14/2022)   Hunger Vital Sign    Worried About Running Out of Food in the Last Year: Often true    Ran Out of Food in the Last Year: Often true  Transportation Needs: Unmet Transportation Needs (12/14/2022)   PRAPARE - Transportation    Lack of Transportation (Medical): Yes    Lack of Transportation (Non-Medical): Yes  Physical Activity: Unknown (01/13/2019)   Exercise Vital Sign    Days of Exercise per Week: Patient refused    Minutes of Exercise per Session: Patient refused  Stress: Unknown (01/13/2019)   [employer] [place] of Stress : Patient refused  Social Connections: Unknown (01/13/2019)   Social Connection and Isolation Panel [NHANES]    Frequency of Communication with Friends and Family: Patient refused    Frequency of Social Gatherings with Friends and Family: Patient refused    Attends Religious Services: Patient refused    Marine scientist or Organizations: Patient refused    Attends Music therapist: Patient refused    Marital Status: Patient refused   Additional Social History:                         Sleep: Fair  Appetite:  Fair  Current Medications: Current Facility-Administered Medications  Medication Dose Route Frequency Provider Last Rate Last Admin   acetaminophen (TYLENOL) 325 MG tablet            acetaminophen (TYLENOL) tablet 650 mg  650 mg Oral Q6H PRN Ara Mano T, MD   650 mg at 12/16/22 1254   alteplase (CATHFLO ACTIVASE) injection 2 mg  2 mg Intracatheter Once PRN Kelee Cunningham, Madie Reno, MD       alteplase (CATHFLO ACTIVASE) injection 2 mg  2 mg Intracatheter Once PRN Dwana Melena, MD       alum & mag hydroxide-simeth (MAALOX/MYLANTA) 200-200-20 MG/5ML suspension 30 mL  30 mL Oral Q4H PRN Cleland Simkins T, MD        anticoagulant sodium citrate solution 5 mL  5 mL Intracatheter PRN Vikrant Pryce T, MD       anticoagulant sodium citrate solution 5 mL  5 mL Intracatheter PRN Dwana Melena, MD       ARIPiprazole (ABILIFY) tablet 7.5 mg  7.5 mg Oral Daily Braidon Chermak T, MD   7.5 mg at 12/15/22 5035   calcitRIOL (ROCALTROL) capsule 0.25 mcg  0.25 mcg Oral Q T,Th,Sa-HD Syler Norcia T, MD       calcium acetate (PHOSLO) capsule 667 mg  667 mg Oral TID WC Kaelie Henigan, Madie Reno, MD   667 mg at 12/15/22 1708   carvedilol (COREG) tablet 6.25 mg  6.25 mg Oral BID WC Khailee Mick, Madie Reno, MD   6.25 mg at 12/15/22 1708   Chlorhexidine Gluconate Cloth 2 % PADS 6 each  6 each Topical Q0600 Colon Flattery, NP       diclofenac Sodium (VOLTAREN) 1 % topical gel 2 g  2 g Topical QID Anays Detore, Madie Reno, MD       doxycycline (VIBRA-TABS) tablet 100 mg  100 mg Oral Q12H Lynnley Doddridge T, MD   100 mg at 12/15/22 2110   DULoxetine (CYMBALTA) DR capsule 20 mg  20 mg Oral Daily Garnett Rekowski, Madie Reno, MD   20 mg at 12/15/22 0959   epoetin alfa (EPOGEN) injection 10,000 Units  10,000 Units Intravenous Q M,W,F-HD Colon Flattery, NP   10,000 Units at 12/16/22 1036   gentamicin ointment (GARAMYCIN) 0.1 %   Topical TID Darnita Woodrum, Madie Reno, MD   Given at 12/15/22 1725   heparin injection 1,000 Units  1,000 Units Intracatheter PRN Colon Flattery, NP       heparin injection 1,000 Units  1,000 Units Intracatheter PRN Dwana Melena, MD       heparin injection 5,000 Units  5,000 Units Dialysis Once in dialysis Dwana Melena, MD       hydrALAZINE (APRESOLINE) tablet 50 mg  50 mg Oral Q8H Jocsan Mcginley, Madie Reno, MD   50 mg at 12/15/22 1708   isosorbide mononitrate (IMDUR) 24 hr tablet 60 mg  60 mg Oral Daily Eleora Sutherland, Madie Reno, MD   60 mg at 12/15/22 0957   lidocaine (PF) (XYLOCAINE) 1 % injection 5 mL  5 mL Intradermal PRN Colon Flattery, NP       lidocaine (PF) (XYLOCAINE) 1 % injection 5 mL  5 mL Intradermal PRN Dwana Melena, MD       lidocaine-prilocaine (EMLA) cream 1  Application  1 Application Topical PRN Shekina Cordell, Madie Reno, MD       lidocaine-prilocaine (EMLA) cream 1 Application  1 Application Topical PRN Dwana Melena, MD       magnesium hydroxide (MILK OF MAGNESIA) suspension 30 mL  30 mL Oral Daily PRN Dawid Dupriest T, MD       oxyCODONE (Oxy IR/ROXICODONE) immediate release tablet 5 mg  5 mg Oral Q8H PRN Jaaziel Peatross, Madie Reno, MD   5 mg at 12/16/22 1254   pentafluoroprop-tetrafluoroeth (GEBAUERS) aerosol 1 Application  1 Application Topical PRN Dorette Hartel, Madie Reno, MD       pentafluoroprop-tetrafluoroeth (GEBAUERS) aerosol 1 Application  1 Application Topical PRN Dwana Melena, MD       pentafluoroprop-tetrafluoroeth (GEBAUERS) aerosol            phenylephrine (NEO-SYNEPHRINE) 0.25 % nasal spray 2 spray  2 spray Each Nare Q4H PRN Shayan Bramhall T, MD       sevelamer carbonate (RENVELA) tablet 1,600 mg  1,600 mg Oral TID WC Willetta York T, MD   1,600 mg at 12/15/22 1708   torsemide (DEMADEX) tablet 100 mg  100 mg Oral q morning Ronal Maybury T, MD   100 mg at 12/15/22 0959   traZODone (DESYREL) tablet 25 mg  25 mg Oral QHS PRN Jazir Newey, Madie Reno, MD   25 mg at 12/15/22 2110    Lab Results:  Results for orders placed or performed during the hospital encounter of 12/14/22 (from the past 48 hour(s))  Renal function panel     Status: Abnormal   Collection Time: 12/16/22  9:31 AM  Result Value Ref Range   Sodium 135  135 - 145 mmol/L   Potassium 4.6 3.5 - 5.1 mmol/L   Chloride 97 (L) 98 - 111 mmol/L   CO2 26 22 - 32 mmol/L   Glucose, Bld 121 (H) 70 - 99 mg/dL    Comment: Glucose reference range applies only to samples taken after fasting for at least 8 hours.   BUN 81 (H) 6 - 20 mg/dL   Creatinine, Ser 5.82 (H) 0.44 - 1.00 mg/dL   Calcium 8.9 8.9 - 10.3 mg/dL   Phosphorus 5.0 (H) 2.5 - 4.6 mg/dL   Albumin 2.4 (L) 3.5 - 5.0 g/dL   GFR, Estimated 10 (L) >60 mL/min    Comment: (NOTE) Calculated using the CKD-EPI Creatinine Equation (2021)    Anion gap 12 5 - 15     Comment: Performed at [facility], [address] [place], [place] [address]  CBC     Status: Abnormal   Collection Time: 12/16/22  9:31 AM  Result Value Ref Range   WBC 8.3 4.0 - 10.5 K/uL   RBC 2.64 (L) 3.87 - 5.11 MIL/uL   Hemoglobin 7.4 (L) 12.0 - 15.0 g/dL   HCT 24.5 (L) 36.0 - 46.0 %   MCV 92.8 80.0 - 100.0 fL   MCH 28.0 26.0 - 34.0 pg   MCHC 30.2 30.0 - 36.0 g/dL   RDW 18.2 (H) 11.5 - 15.5 %   Platelets 171 150 - 400 K/uL   nRBC 0.0 0.0 - 0.2 %    Comment: Performed at [facility], [address] [place], [place] [address]    Blood Alcohol level:  Lab Results  Component Value Date   [facility] <10 12/08/2022   ETH <10 97/67/3419    Metabolic Disorder Labs: Lab Results  Component Value Date   HGBA1C 5.4 12/24/2021   MPG 108 12/24/2021   MPG 105.41 01/16/2021   No results found for: "PROLACTIN" Lab Results  Component Value Date   CHOL 109 10/04/2022   TRIG 66 10/04/2022   HDL 54 10/04/2022   CHOLHDL 2.0 10/04/2022   VLDL 13 10/04/2022   LDLCALC 42 10/04/2022   LDLCALC 54 12/24/2021    Physical Findings: AIMS:  , ,  ,  ,    CIWA:    COWS:     Musculoskeletal: Strength & Muscle Tone: within normal limits Gait & Station: normal, unable to stand Patient leans: N/A  Psychiatric Specialty Exam:  Presentation  General Appearance:  Casual  Eye Contact: Fair  Speech: Clear and Coherent  Speech Volume: Normal  Handedness: Right   Mood and Affect  Mood: Depressed; Anxious  Affect: Congruent; Depressed   Thought Process  Thought Processes: Coherent  Descriptions of Associations:Intact  Orientation:Full (Time, Place and Person)  Thought Content:Illogical  History of Schizophrenia/Schizoaffective disorder:No  Duration of Psychotic Symptoms:No data recorded Hallucinations:No data recorded Ideas of Reference:None  Suicidal Thoughts:No data recorded Homicidal Thoughts:No data recorded  Sensorium   Memory: Immediate Good; Recent Good; Remote Good  Judgment: -- (impulsive)  Insight: Lacking   Executive Functions  Concentration: Fair  Attention Span: Fair  Recall: [place] of Knowledge: Good  Language: Good   Psychomotor Activity  Psychomotor Activity:No data recorded  Assets  Assets: Communication Skills; Desire for Improvement; Financial Resources/Insurance   Sleep  Sleep:No data recorded   Physical Exam: Physical Exam Vitals and nursing note reviewed.  Constitutional:      Appearance: Normal appearance.  HENT:     Head: Normocephalic and atraumatic.  Mouth/Throat:     Pharynx: Oropharynx is clear.  Eyes:     Pupils: Pupils are equal, round, and reactive to light.  Cardiovascular:     Rate and Rhythm: Normal rate and regular rhythm.  Pulmonary:     Effort: Pulmonary effort is normal.     Breath sounds: Normal breath sounds.  Abdominal:     General: Abdomen is flat.     Palpations: Abdomen is soft.  Musculoskeletal:        General: Normal range of motion.  Skin:    General: Skin is warm and dry.  Neurological:     General: No focal deficit present.     Mental Status: She is alert. Mental status is at baseline.  Psychiatric:        Attention and Perception: Attention normal.        Mood and Affect: Mood normal.        Speech: Speech normal.        Behavior: Behavior normal.        Thought Content: Thought content normal.        Cognition and Memory: Cognition normal.    Review of Systems  Constitutional:  Positive for malaise/fatigue.  HENT: Negative.    Eyes: Negative.   Respiratory: Negative.    Cardiovascular: Negative.   Gastrointestinal: Negative.   Musculoskeletal: Negative.   Skin: Negative.   Neurological: Negative.   Psychiatric/Behavioral: Negative.     Blood pressure (!) 136/103, pulse (!) 104, temperature 98 F (36.7 C), temperature source Oral, resp. rate (!) 24, weight 93.6 kg, SpO2 100 %. Body mass index  is 31.38 kg/m.   Treatment Plan Summary: Medication management and Plan continue current medication.  I had stopped the gabapentin last time yesterday because of fatigue.  Now that she has had dialysis today we may try giving a single 100 mg dose after dialysis and hold off on any more until the next dialysis session which is another recommended plan for using that medicine in patients with renal failure.  Patient mentioned considering going to an [facility].  This seems unlikely given her needs but we will follow-up with the treatment team.  Alethia Berthold, MD 12/16/2022, 2:02 PM

## 2022-12-16 NOTE — Group Note (Signed)
[facility] LCSW Group Therapy Note   Group Date: 12/16/2022 Start Time: 1300 End Time: 1400   Type of Therapy/Topic:  Group Therapy:  Emotion Regulation  Participation Level:  Minimal   Mood:  Description of Group:    The purpose of this group is to assist patients in learning to regulate negative emotions and experience positive emotions. Patients will be guided to discuss ways in which they have been vulnerable to their negative emotions. These vulnerabilities will be juxtaposed with experiences of positive emotions or situations, and patients challenged to use positive emotions to combat negative ones. Special emphasis will be placed on coping with negative emotions in conflict situations, and patients will process healthy conflict resolution skills.  Therapeutic Goals: Patient will identify two positive emotions or experiences to reflect on in order to balance out negative emotions:  Patient will label two or more emotions that they find the most difficult to experience:  Patient will be able to demonstrate positive conflict resolution skills through discussion or role plays:   Summary of Patient Progress: Patient was present in group.  Patient sat with her eyes closed as if asleep, however, was awake and participate in group in intervals.  Patient was able to label emotions as well as discuss how feelings are not facts.  Patient was supportive of other group members.  Therapeutic Modalities:   Cognitive Behavioral Therapy Feelings Identification Dialectical Behavioral Therapy   Rozann Lesches, LCSW

## 2022-12-16 NOTE — Progress Notes (Signed)
Despite providing education, patient refused ceftriaxone due to stating "I do not like the pain from shots." Patient was compliant with azithromycin po medication.

## 2022-12-17 LAB — RPR: RPR Ser Ql: NONREACTIVE

## 2022-12-17 MED ORDER — DICLOFENAC SODIUM 1 % EX GEL
2.0000 g | Freq: Four times a day (QID) | CUTANEOUS | Status: DC
  Administered 2022-12-18 – 2022-12-21 (×6): 2 g via TOPICAL

## 2022-12-17 NOTE — Group Note (Signed)
[facility] LCSW Group Therapy Note   Group Date: 12/17/2022 Start Time: 1300 End Time: 1400   Type of Therapy/Topic:  Group Therapy:  Balance in Life  Participation Level:  Active   Description of Group:    This group will address the concept of balance and how it feels and looks when one is unbalanced. Patients will be encouraged to process areas in their lives that are out of balance, and identify reasons for remaining unbalanced. Facilitators will guide patients utilizing problem- solving interventions to address and correct the stressor making their life unbalanced. Understanding and applying boundaries will be explored and addressed for obtaining  and maintaining a balanced life. Patients will be encouraged to explore ways to assertively make their unbalanced needs known to significant others in their lives, using other group members and facilitator for support and feedback.  Therapeutic Goals: Patient will identify two or more emotions or situations they have that consume much of in their lives. Patient will identify signs/triggers that life has become out of balance:  Patient will identify two ways to set boundaries in order to achieve balance in their lives:  Patient will demonstrate ability to communicate their needs through discussion and/or role plays  Summary of Patient Progress: Patient was present for the entirety of the group process. She defined balance as stability. Confrontation, death in the family, and trauma were identified as things that can throw her off balance. Pt shared that when she misses her dialysis then the toxins build up in her body which makes her become off balanced physically and emotionally. She stated that the only thing that helps is making sure that she makes it to her appointments. Pt shared that she maintains balance by making an agenda/schedule for herself to keep her focused on her goals. She stated that it will be important for her to stop isolating in order  to maintain balance in her life. Pt was open and receptive to feedback/comments from both peers and facilitator. She appeared to have slight insight into the topic.    Therapeutic Modalities:   Cognitive Behavioral Therapy Solution-Focused Therapy Assertiveness Training   Shirl Harris, LCSW

## 2022-12-17 NOTE — Progress Notes (Signed)
Pt ran for 3hrs UF only  removed 3.0L fluid tolerated well    12/17/22 1158  Vitals  Temp 97.7 F (36.5 C)  Temp Source Oral  BP 122/81  MAP (mmHg) 93  BP Location Right Wrist  BP Method Automatic  Patient Position (if appropriate) Lying  Pulse Rate (!) 105  Pulse Rate Source Monitor  ECG Heart Rate (!) 104  Resp (!) 22  Oxygen Therapy  SpO2 97 %  O2 Device Room Air  Patient Activity (if Appropriate) In chair  Pulse Oximetry Type Continuous  During Treatment Monitoring  HD Safety Checks Performed Yes  Intra-Hemodialysis Comments Tx completed;Tolerated well  Post Treatment  Dialyzer Clearance Lightly streaked  Duration of HD Treatment -hour(s) 3 hour(s)  Hemodialysis Intake (mL) 0 mL  Liters Processed 72  Fluid Removed (mL) 3000 mL  Tolerated HD Treatment Yes  Post-Hemodialysis Comments HD complete, Goal me, Tolerated well.  AVG/AVF Arterial Site Held (minutes) 10 minutes  AVG/AVF Venous Site Held (minutes) 10 minutes  Note  Observations Tx ended  Fistula / Graft Left Upper arm  No placement date or time found.   Orientation: Left  Access Location: Upper arm  Site Condition No complications  Fistula / Graft Assessment Present;Thrill;Bruit  Status Deaccessed  Drainage Description None

## 2022-12-17 NOTE — [facility] Group Notes (Signed)
[place] Group Notes:  (Nursing/MHT/Case Management/Adjunct)  Date:  12/17/2022  Time:  10:03 PM  Type of Therapy:   Wrap up  Participation Level:  Active  Participation Quality:  Inattentive  Affect:  Labile  Cognitive:  Alert  Insight:  Good  Engagement in Group:  Engaged  Modes of Intervention:  Discussion  Summary of Progress/Problems:  Natasha Long 12/17/2022, 10:03 PM

## 2022-12-17 NOTE — Progress Notes (Signed)
[facility]  ROUNDING NOTE   Subjective:   Patient seen and evaluated during dialysis   HEMODIALYSIS FLOWSHEET:  Blood Flow Rate (mL/min): 400 mL/min Arterial Pressure (mmHg): -140 mmHg Venous Pressure (mmHg): 240 mmHg TMP (mmHg): -1 mmHg Ultrafiltration Rate (mL/min): 1266 mL/min Dialysate Flow Rate (mL/min):  (Uf Only Tx) Dialysis Fluid Bolus: Normal Saline  Tolerating treatment well, seated in chair Alert, oriented. Appears in good spirits due to discharge plan  Objective:  Vital signs in last 24 hours:  Temp:  [98 F (36.7 C)-98.3 F (36.8 C)] 98.3 F (36.8 C) (01/18 0828) Pulse Rate:  [104-112] 109 (01/18 1030) Resp:  [20-26] 20 (01/18 1030) BP: (119-137)/(71-103) 126/71 (01/18 1030) SpO2:  [93 %-100 %] 96 % (01/18 1030) Weight:  [93.6 kg-94.6 kg] 94.6 kg (01/18 0842)  Weight change:  Filed Weights   12/14/22 1514 12/16/22 1257 12/17/22 0842  Weight: 93 kg 93.6 kg 94.6 kg    Intake/Output: I/O last 3 completed shifts: In: -  Out: 3000 [Other:3000]   Intake/Output this shift:  No intake/output data recorded.  Physical Exam: General: NAD  Head: Normocephalic, atraumatic. Moist oral mucosal membranes  Eyes: Anicteric  Lungs:  Clear to auscultation, normal effort, room air  Heart: Regular rate and rhythm  Abdomen:  Soft, nontender  Extremities: 1+ peripheral edema.  Neurologic: Alert and oriented, moving all four extremities  Skin: No lesions  Access: Left aVF    Basic Metabolic Panel: Recent Labs  Lab 12/11/22 0859 12/12/22 0817 12/13/22 0724 12/14/22 0226 12/16/22 0931  NA 136 137 136 136 135  K 4.8 4.7 5.5* 5.4* 4.6  CL 101 102 101 101 97*  CO2 21* 23 23 25 26   GLUCOSE 129* 88 83 93 121*  BUN 93* 74* 91* 92* 81*  CREATININE 7.61* 5.93* 6.99* 7.81* 5.82*  CALCIUM 9.2 9.4 9.4 9.2 8.9  MG  --   --  2.2 2.2  --   PHOS 6.1*  --  6.4* 6.3* 5.0*     Liver Function Tests: Recent Labs  Lab 12/11/22 0859 12/13/22 0724  12/14/22 0226 12/16/22 0931  ALBUMIN 2.6* 2.6* 2.6* 2.4*    No results for input(s): "LIPASE", "AMYLASE" in the last 168 hours. No results for input(s): "AMMONIA" in the last 168 hours.  CBC: Recent Labs  Lab 12/11/22 0859 12/12/22 0817 12/12/22 1619 12/13/22 0724 12/16/22 0931  WBC 6.8 6.5 7.3 7.4 8.3  HGB 6.6* 9.0* 8.9* 9.1* 7.4*  HCT 22.2* 29.0* 28.6* 29.1* 24.5*  MCV 95.7 92.7 93.5 92.4 92.8  PLT 124* 133* 134* 159 171     Cardiac Enzymes: No results for input(s): "CKTOTAL", "CKMB", "CKMBINDEX", "TROPONINI" in the last 168 hours.  BNP: Invalid input(s): "POCBNP"  CBG: Recent Labs  Lab 12/11/22 2053  GLUCAP 125*     Microbiology: Results for orders placed or performed during the hospital encounter of 12/09/22  MRSA Next Gen by PCR, Nasal     Status: None   Collection Time: 12/11/22 10:34 AM   Specimen: Nasal Mucosa; Nasal Swab  Result Value Ref Range Status   MRSA by PCR Next Gen NOT DETECTED NOT DETECTED Final    Comment: (NOTE) The GeneXpert MRSA Assay (FDA approved for NASAL specimens only), is one component of a comprehensive MRSA colonization surveillance program. It is not intended to diagnose MRSA infection nor to guide or monitor treatment for MRSA infections. Test performance is not FDA approved in patients less than 54 years old. Performed at [facility],  [place], [place] [address]     Coagulation Studies: No results for input(s): "LABPROT", "INR" in the last 72 hours.  Urinalysis: No results for input(s): "COLORURINE", "LABSPEC", "PHURINE", "GLUCOSEU", "HGBUR", "BILIRUBINUR", "KETONESUR", "PROTEINUR", "UROBILINOGEN", "NITRITE", "LEUKOCYTESUR" in the last 72 hours.  Invalid input(s): "APPERANCEUR"    Imaging: No results found.   Medications:    anticoagulant sodium citrate     anticoagulant sodium citrate      ARIPiprazole  7.5 mg Oral Daily   calcitRIOL  0.25 mcg Oral Q T,Th,Sa-HD   calcium acetate   667 mg Oral TID WC   carvedilol  6.25 mg Oral BID WC   cefTRIAXone (ROCEPHIN) IM  250 mg Intramuscular Once   Chlorhexidine Gluconate Cloth  6 each Topical Q0600   [START ON 12/18/2022] diclofenac Sodium  2 g Topical QID   DULoxetine  20 mg Oral Daily   epoetin (EPOGEN/PROCRIT) injection  10,000 Units Intravenous Q M,W,F-HD   gentamicin ointment   Topical TID   heparin  5,000 Units Dialysis Once in dialysis   hydrALAZINE  50 mg Oral Q8H   isosorbide mononitrate  60 mg Oral Daily   sevelamer carbonate  1,600 mg Oral TID WC   torsemide  100 mg Oral q morning   acetaminophen, alteplase, alteplase, alum & mag hydroxide-simeth, anticoagulant sodium citrate, anticoagulant sodium citrate, heparin, heparin, lidocaine (PF), lidocaine (PF), lidocaine-prilocaine, lidocaine-prilocaine, magnesium hydroxide, oxyCODONE, pentafluoroprop-tetrafluoroeth, pentafluoroprop-tetrafluoroeth, phenylephrine, traZODone  Assessment/ Plan:  Natasha Long is a 27 y.o.  female with past medical conditions including CHF, hypertension, PTSD, borderline personality disorder, and end-stage renal disease on hemodialysis, who was admitted to [facility] on 12/09/2022 for  Severe recurrent major depression without psychotic features ([place]) [F33.2]  [facility]/MWF/left aVF  End-stage renal disease on hemodialysis.   Dialysis received yesterday, UF 3L achieved. Receiving UF only treatment today for additional fluid removal. Next treatment scheduled for Friday.   Renal navigator aware of patient and Is following up on discharge plans. May require outpatient clinic transfer.    2. Anemia of chronic kidney disease Lab Results  Component Value Date   HGB 7.4 (L) 12/16/2022    Hgb below goal.  Continue EPO with routine dialysis.  3. Secondary Hyperparathyroidism: with outpatient labs: PTH 886, phosphorus 8.0, calcium 9.6 on 10/30/2022.              Phosphorus has improved. Continue calcium acetate and sevelamer with  meals.  4.  Hypertension with chronic kidney disease.  Patient currently prescribed carvedilol, hydralazine, isosorbide, and torsemide.  Currently receiving these medications.  Blood pressure 119/76 during dialysis  5.  Hyperkalemia likely due to poorly monitored diet.  Corrected with dialysis   LOS: 3   1/18/202410:49 AM

## 2022-12-17 NOTE — Progress Notes (Signed)
Pt denies SI/HI/AVH and verbally agrees to approach staff if these become apparent or before harming themselves/others. Rates depression 0/10. Rates anxiety 0/10. Rates pain 8/10. Pt has been in and out of her room. Pt has made statements that she does not want to be in her room anymore. Pt has had a few nose bleeds throughout the day. Pt stated that it is normal for her and that she feels fine and does not need anything. V/S are WDL. Scheduled medications administered to pt, per MD orders. RN provided support and encouragement to pt. Q15 min safety checks implemented and continued. Pt safe on the unit. RN will continue to monitor and intervene as needed.  12/17/22 0747  Psych Admission Type (Psych Patients Only)  Admission Status Voluntary  Psychosocial Assessment  Patient Complaints None  Eye Contact Fair  Facial Expression Flat  Affect Anxious  Speech Logical/coherent  Interaction Minimal  Motor Activity Slow  Appearance/Hygiene Unremarkable  Behavior Characteristics Cooperative;Appropriate to situation;Calm  Mood Anxious;Pleasant  Aggressive Behavior  Effect No apparent injury  Thought Process  Coherency WDL  Content WDL  Delusions None reported or observed  Perception WDL  Hallucination None reported or observed  Judgment Limited  Confusion None  Danger to Self  Current suicidal ideation? Denies  Danger to Others  Danger to Others None reported or observed

## 2022-12-17 NOTE — Progress Notes (Signed)
[facility] MD Progress Note  12/17/2022 2:24 PM Natasha Long  MRN:  [id number] Subjective: Follow-up 27 year old woman with depression and PTSD multiple severe medical problems.  Patient tells me that she has been accepted into an [facility] in [name] with a tentative acceptance date of next Wednesday.  She is quite pleased with this send I think she has every right to be she has worked hard on finding this place.  She has been cooperating with dialysis and admits that she is physically feeling better.  No acute behavior problems affect stable. Principal Problem: Severe recurrent major depression without psychotic features ([place]) Diagnosis: Principal Problem:   Severe recurrent major depression without psychotic features ([place]) Active Problems:   PTSD (post-traumatic stress disorder)   HTN (hypertension)   Borderline personality disorder (HCC)   Moderate cannabis use disorder (HCC)   ESRD (end stage renal disease) (HCC)   Acute on chronic diastolic CHF (congestive heart failure) (HCC)   Chronic back pain   Homelessness  Total Time spent with patient: 30 minutes  Past Psychiatric History: Past history of longstanding issues with mood PTSD substance abuse problems  Past Medical History:  Past Medical History:  Diagnosis Date   Asthma    as a child, no problem as an adult, no inhaler   CHF (congestive heart failure) (HCC)    Complication of anesthesia    woke up before tube removed, 1 time fought nurses   ESRD on hemodialysis ([place])    M-W-F   History of borderline personality disorder    Hypertension    diagnosed as child; stopped meds at 76 yo   Insomnia    Neuromuscular disorder ([place])    peripheral neuropathy   Nonspecific chest pain    PTSD (post-traumatic stress disorder)     Past Surgical History:  Procedure Laterality Date   AV FISTULA PLACEMENT Left 10/18/2020   Procedure: LEFT ARM ARTERIOVENOUS (AV) FISTULA CREATION;  Surgeon: Serafina Mitchell, MD;  Location: [facility];  Service: Vascular;  Laterality: Left;   CHOLECYSTECTOMY     extraction of wisdom teeth     FISTULA SUPERFICIALIZATION Left 02/13/2021   Procedure: LEFT BRACHIOCEPHALIC ARTERIOVENOUS FISTULA SUPERFICIALIZATION;  Surgeon: Serafina Mitchell, MD;  Location: [place];  Service: Vascular;  Laterality: Left;   I & D EXTREMITY Left 01/30/2022   Procedure: IRRIGATION AND DEBRIDEMENT EXTREMITY;  Surgeon: Milly Jakob, MD;  Location: [place];  Service: Orthopedics;  Laterality: Left;   INCISION AND DRAINAGE OF WOUND Left 01/30/2022   Procedure: LEFT WRIST ASPIRATION;  Surgeon: Vanetta Mulders, MD;  Location: [place];  Service: Orthopedics;  Laterality: Left;   KNEE ARTHROSCOPY Right 01/30/2022   Procedure: ARTHROSCOPY KNEE AND IRRIIGATION AND DEBRIDMENT; LEFT WRIST ASPIRATION;  Surgeon: Vanetta Mulders, MD;  Location: [place];  Service: Orthopedics;  Laterality: Right;   RENAL BIOPSY     x 2   TUNNELED VENOUS CATHETER PLACEMENT  02/11/2021   [facility]   Family History:  Family History  Adopted: Yes  Problem Relation Age of Onset   Diabetes Other    Hypertension Other    Family Psychiatric  History: See previous Social History:  Social History   Substance and Sexual Activity  Alcohol Use Not Currently   Alcohol/week: 1.0 standard drink of alcohol   Types: 1 Shots of liquor per week     Social History   Substance and Sexual Activity  Drug Use Yes   Types: Marijuana, Cocaine, Oxycodone   Comment: daily  cocaine use; cannabis use 1x weekly; Rx opiates 1x monthly    Social History   Socioeconomic History   Marital status: Soil scientist    Spouse name: Not on file   Number of children: 1   Years of education: Not on file   Highest education level: 12th grade  Occupational History   Occupation: unemployed  Tobacco Use   Smoking status: Every Day    Packs/day: 0.50    Years: 10.00    Total pack years: 5.00    Types: Cigarettes   Smokeless tobacco: Never  Vaping Use   Vaping  Use: Some days  Substance and Sexual Activity   Alcohol use: Not Currently    Alcohol/week: 1.0 standard drink of alcohol    Types: 1 Shots of liquor per week   Drug use: Yes    Types: Marijuana, Cocaine, Oxycodone    Comment: daily cocaine use; cannabis use 1x weekly; Rx opiates 1x monthly   Sexual activity: Yes    Partners: Male    Birth control/protection: None    Comment: reports exchanging sex for drugs  Other Topics Concern   Not on file  Social History Narrative   Not on file   Social Determinants of Health   Financial Resource Strain: Unknown (01/13/2019)   Overall Financial Resource Strain (CARDIA)    Difficulty of Paying Living Expenses: Patient refused  Food Insecurity: Food Insecurity Present (12/14/2022)   Hunger Vital Sign    Worried About Running Out of Food in the Last Year: Often true    Ran Out of Food in the Last Year: Often true  Transportation Needs: Unmet Transportation Needs (12/14/2022)   PRAPARE - Transportation    Lack of Transportation (Medical): Yes    Lack of Transportation (Non-Medical): Yes  Physical Activity: Unknown (01/13/2019)   Exercise Vital Sign    Days of Exercise per Week: Patient refused    Minutes of Exercise per Session: Patient refused  Stress: Unknown (01/13/2019)   [employer] of Stress : Patient refused  Social Connections: Unknown (01/13/2019)   Social Connection and Isolation Panel [NHANES]    Frequency of Communication with Friends and Family: Patient refused    Frequency of Social Gatherings with Friends and Family: Patient refused    Attends Religious Services: Patient refused    Marine scientist or Organizations: Patient refused    Attends Archivist Meetings: Patient refused    Marital Status: Patient refused   Additional Social History:                         Sleep: Fair  Appetite:  Fair  Current  Medications: Current Facility-Administered Medications  Medication Dose Route Frequency Provider Last Rate Last Admin   acetaminophen (TYLENOL) tablet 650 mg  650 mg Oral Q6H PRN Sharolyn Weber T, MD   650 mg at 12/16/22 2144   alteplase (CATHFLO ACTIVASE) injection 2 mg  2 mg Intracatheter Once PRN Nathanie Ottley T, MD       alteplase (CATHFLO ACTIVASE) injection 2 mg  2 mg Intracatheter Once PRN Dwana Melena, MD       alum & mag hydroxide-simeth (MAALOX/MYLANTA) 200-200-20 MG/5ML suspension 30 mL  30 mL Oral Q4H PRN Breyana Follansbee T, MD       anticoagulant sodium citrate solution 5 mL  5 mL Intracatheter PRN Waylyn Tenbrink, Madie Reno, MD  anticoagulant sodium citrate solution 5 mL  5 mL Intracatheter PRN Dwana Melena, MD       ARIPiprazole (ABILIFY) tablet 7.5 mg  7.5 mg Oral Daily Carin Shipp, Madie Reno, MD   7.5 mg at 12/17/22 2119   calcitRIOL (ROCALTROL) capsule 0.25 mcg  0.25 mcg Oral Q T,Th,Sa-HD Blessyn Sommerville T, MD   0.25 mcg at 12/17/22 1357   calcium acetate (PHOSLO) capsule 667 mg  667 mg Oral TID WC Zema Lizardo, Madie Reno, MD   667 mg at 12/17/22 1357   carvedilol (COREG) tablet 6.25 mg  6.25 mg Oral BID WC Nikholas Geffre T, MD   6.25 mg at 12/16/22 1421   cefTRIAXone (ROCEPHIN) injection 250 mg  250 mg Intramuscular Once Leeann Bady, Madie Reno, MD       Chlorhexidine Gluconate Cloth 2 % PADS 6 each  6 each Topical Q0600 Colon Flattery, NP       [START ON 12/18/2022] diclofenac Sodium (VOLTAREN) 1 % topical gel 2 g  2 g Topical QID Deniya Craigo T, MD       DULoxetine (CYMBALTA) DR capsule 20 mg  20 mg Oral Daily Hamza Empson, Madie Reno, MD   20 mg at 12/17/22 0744   epoetin alfa (EPOGEN) injection 10,000 Units  10,000 Units Intravenous Q M,W,F-HD Colon Flattery, NP   10,000 Units at 12/16/22 1036   gentamicin ointment (GARAMYCIN) 0.1 %   Topical TID Hailey Stormer, Madie Reno, MD   Given at 12/17/22 1357   heparin injection 1,000 Units  1,000 Units Intracatheter PRN Colon Flattery, NP       heparin injection 1,000 Units   1,000 Units Intracatheter PRN Dwana Melena, MD       heparin injection 5,000 Units  5,000 Units Dialysis Once in dialysis Dwana Melena, MD       hydrALAZINE (APRESOLINE) tablet 50 mg  50 mg Oral Q8H Lindora Alviar, Madie Reno, MD   50 mg at 12/16/22 1421   isosorbide mononitrate (IMDUR) 24 hr tablet 60 mg  60 mg Oral Daily Romaldo Saville, Madie Reno, MD   60 mg at 12/17/22 0745   lidocaine (PF) (XYLOCAINE) 1 % injection 5 mL  5 mL Intradermal PRN Colon Flattery, NP       lidocaine (PF) (XYLOCAINE) 1 % injection 5 mL  5 mL Intradermal PRN Dwana Melena, MD       lidocaine-prilocaine (EMLA) cream 1 Application  1 Application Topical PRN Jojo Pehl, Madie Reno, MD       lidocaine-prilocaine (EMLA) cream 1 Application  1 Application Topical PRN Dwana Melena, MD       magnesium hydroxide (MILK OF MAGNESIA) suspension 30 mL  30 mL Oral Daily PRN Marchello Rothgeb T, MD       oxyCODONE (Oxy IR/ROXICODONE) immediate release tablet 5 mg  5 mg Oral Q8H PRN Alanny Rivers, Madie Reno, MD   5 mg at 12/17/22 0747   pentafluoroprop-tetrafluoroeth (GEBAUERS) aerosol 1 Application  1 Application Topical PRN Ryatt Corsino, Madie Reno, MD       pentafluoroprop-tetrafluoroeth (GEBAUERS) aerosol 1 Application  1 Application Topical PRN Dwana Melena, MD       phenylephrine (NEO-SYNEPHRINE) 0.25 % nasal spray 2 spray  2 spray Each Nare Q4H PRN Carle Dargan, Madie Reno, MD       sevelamer carbonate (RENVELA) tablet 1,600 mg  1,600 mg Oral TID WC Daquane Aguilar T, MD   1,600 mg at 12/17/22 1357   torsemide (DEMADEX) tablet 100 mg  100 mg Oral q morning  Demarco Bacci, Madie Reno, MD   100 mg at 12/17/22 1357   traZODone (DESYREL) tablet 25 mg  25 mg Oral QHS PRN Daneli Butkiewicz, Madie Reno, MD   25 mg at 12/16/22 2144    Lab Results:  Results for orders placed or performed during the hospital encounter of 12/14/22 (from the past 48 hour(s))  Renal function panel     Status: Abnormal   Collection Time: 12/16/22  9:31 AM  Result Value Ref Range   Sodium 135 135 - 145 mmol/L   Potassium 4.6 3.5 - 5.1  mmol/L   Chloride 97 (L) 98 - 111 mmol/L   CO2 26 22 - 32 mmol/L   Glucose, Bld 121 (H) 70 - 99 mg/dL    Comment: Glucose reference range applies only to samples taken after fasting for at least 8 hours.   BUN 81 (H) 6 - 20 mg/dL   Creatinine, Ser 5.82 (H) 0.44 - 1.00 mg/dL   Calcium 8.9 8.9 - 10.3 mg/dL   Phosphorus 5.0 (H) 2.5 - 4.6 mg/dL   Albumin 2.4 (L) 3.5 - 5.0 g/dL   GFR, Estimated 10 (L) >60 mL/min    Comment: (NOTE) Calculated using the CKD-EPI Creatinine Equation (2021)    Anion gap 12 5 - 15    Comment: Performed at [facility], [address] [place], [place] [address]  CBC     Status: Abnormal   Collection Time: 12/16/22  9:31 AM  Result Value Ref Range   WBC 8.3 4.0 - 10.5 K/uL   RBC 2.64 (L) 3.87 - 5.11 MIL/uL   Hemoglobin 7.4 (L) 12.0 - 15.0 g/dL   HCT 24.5 (L) 36.0 - 46.0 %   MCV 92.8 80.0 - 100.0 fL   MCH 28.0 26.0 - 34.0 pg   MCHC 30.2 30.0 - 36.0 g/dL   RDW 18.2 (H) 11.5 - 15.5 %   Platelets 171 150 - 400 K/uL   nRBC 0.0 0.0 - 0.2 %    Comment: Performed at [facility], [address] [place], [place] [address]    Blood Alcohol level:  Lab Results  Component Value Date   [facility] <10 12/08/2022   ETH <10 38/93/7342    Metabolic Disorder Labs: Lab Results  Component Value Date   HGBA1C 5.4 12/24/2021   MPG 108 12/24/2021   MPG 105.41 01/16/2021   No results found for: "PROLACTIN" Lab Results  Component Value Date   CHOL 109 10/04/2022   TRIG 66 10/04/2022   HDL 54 10/04/2022   CHOLHDL 2.0 10/04/2022   VLDL 13 10/04/2022   LDLCALC 42 10/04/2022   LDLCALC 54 12/24/2021    Physical Findings: AIMS: Facial and Oral Movements Muscles of Facial Expression: None, normal Lips and Perioral Area: None, normal Jaw: None, normal Tongue: None, normal,Extremity Movements Upper (arms, wrists, hands, fingers): None, normal Lower (legs, knees, ankles, toes): None, normal, Trunk Movements Neck, shoulders, hips: None, normal,  Overall Severity Severity of abnormal movements (highest score from questions above): None, normal Incapacitation due to abnormal movements: None, normal Patient's awareness of abnormal movements (rate only patient's report): No Awareness, Dental Status Current problems with teeth and/or dentures?: No Does patient usually wear dentures?: No  CIWA:    COWS:     Musculoskeletal: Strength & Muscle Tone: within normal limits Gait & Station: unable to stand Patient leans: N/A  Psychiatric Specialty Exam:  Presentation  General Appearance:  Casual  Eye Contact: Fair  Speech: Clear and Coherent  Speech Volume:  Normal  Handedness: Right   Mood and Affect  Mood: Depressed; Anxious  Affect: Congruent; Depressed   Thought Process  Thought Processes: Coherent  Descriptions of Associations:Intact  Orientation:Full (Time, Place and Person)  Thought Content:Illogical  History of Schizophrenia/Schizoaffective disorder:No  Duration of Psychotic Symptoms:No data recorded Hallucinations:No data recorded Ideas of Reference:None  Suicidal Thoughts:No data recorded Homicidal Thoughts:No data recorded  Sensorium  Memory: Immediate Good; Recent Good; Remote Good  Judgment: -- (impulsive)  Insight: Lacking   Executive Functions  Concentration: Fair  Attention Span: Fair  Recall: [place] of Knowledge: Good  Language: Good   Psychomotor Activity  Psychomotor Activity:No data recorded  Assets  Assets: Communication Skills; Desire for Improvement; Financial Resources/Insurance   Sleep  Sleep:No data recorded   Physical Exam: Physical Exam Vitals and nursing note reviewed.  Constitutional:      Appearance: Normal appearance.  HENT:     Head: Normocephalic and atraumatic.     Mouth/Throat:     Pharynx: Oropharynx is clear.  Eyes:     Pupils: Pupils are equal, round, and reactive to light.  Cardiovascular:     Rate and Rhythm: Normal  rate and regular rhythm.  Pulmonary:     Effort: Pulmonary effort is normal.     Breath sounds: Normal breath sounds.  Abdominal:     General: Abdomen is flat.     Palpations: Abdomen is soft.  Musculoskeletal:        General: Normal range of motion.  Skin:    General: Skin is warm and dry.  Neurological:     General: No focal deficit present.     Mental Status: She is alert. Mental status is at baseline.  Psychiatric:        Mood and Affect: Mood normal.        Thought Content: Thought content normal.    Review of Systems  Constitutional: Negative.   HENT: Negative.    Eyes: Negative.   Respiratory: Negative.    Cardiovascular: Negative.   Gastrointestinal: Negative.   Musculoskeletal: Negative.   Skin: Negative.   Neurological:  Positive for weakness.  Psychiatric/Behavioral: Negative.     Blood pressure 122/81, pulse (!) 105, temperature 97.7 F (36.5 C), temperature source Oral, resp. rate (!) 22, weight 91.6 kg, SpO2 97 %. Body mass index is 30.71 kg/m.   Treatment Plan Summary: Medication management and Plan patient does not appear to be oversedated by the lower dose of gabapentin so I think for the moment we will just leave that as it is.  No other medicine changes.  Next dialysis session will be tomorrow.  Lots of encouragement to the patient and agree that we will do everything we can to facilitate an appropriate discharge.  Next Wednesday gives [place] enough time to make sure she is medically doing well and have outpatient treatment in place.  Alethia Berthold, MD 12/17/2022, 2:24 PM

## 2022-12-17 NOTE — Plan of Care (Signed)
  Problem: Education: Goal: Emotional status will improve Outcome: Progressing Goal: Mental status will improve Outcome: Progressing   Problem: Activity: Goal: Sleeping patterns will improve Outcome: Progressing   Problem: Physical Regulation: Goal: Ability to maintain clinical measurements within normal limits will improve Outcome: Progressing

## 2022-12-17 NOTE — Progress Notes (Signed)
Patient calm and pleasant during assessment denying SI/HI/AVH. Pt endorses anxiety and depression. Pt more active on the unit tonight than when this writer had this patient last week. Pt compliant with medication administration per MD orders. Pt given education, support, and encouragement to be active in her treatment plan. Pt being monitored Q 15 minutes for safety per unit protocol, remains safe on the unit

## 2022-12-18 ENCOUNTER — Other Ambulatory Visit: Payer: Self-pay

## 2022-12-18 LAB — CBC
HCT: 23.6 % — ABNORMAL LOW (ref 36.0–46.0)
Hemoglobin: 7.1 g/dL — ABNORMAL LOW (ref 12.0–15.0)
MCH: 28.4 pg (ref 26.0–34.0)
MCHC: 30.1 g/dL (ref 30.0–36.0)
MCV: 94.4 fL (ref 80.0–100.0)
Platelets: 176 10*3/uL (ref 150–400)
RBC: 2.5 MIL/uL — ABNORMAL LOW (ref 3.87–5.11)
RDW: 17.8 % — ABNORMAL HIGH (ref 11.5–15.5)
WBC: 8.6 10*3/uL (ref 4.0–10.5)
nRBC: 0 % (ref 0.0–0.2)

## 2022-12-18 LAB — RENAL FUNCTION PANEL
Albumin: 2.4 g/dL — ABNORMAL LOW (ref 3.5–5.0)
Anion gap: 12 (ref 5–15)
BUN: 88 mg/dL — ABNORMAL HIGH (ref 6–20)
CO2: 24 mmol/L (ref 22–32)
Calcium: 9.3 mg/dL (ref 8.9–10.3)
Chloride: 101 mmol/L (ref 98–111)
Creatinine, Ser: 6.86 mg/dL — ABNORMAL HIGH (ref 0.44–1.00)
GFR, Estimated: 8 mL/min — ABNORMAL LOW (ref >60)
Glucose, Bld: 82 mg/dL (ref 70–99)
Phosphorus: 6.3 mg/dL — ABNORMAL HIGH (ref 2.5–4.6)
Potassium: 5.1 mmol/L (ref 3.5–5.1)
Sodium: 137 mmol/L (ref 135–145)

## 2022-12-18 LAB — HIV-1 RNA QUANT-NO REFLEX-BLD
HIV 1 RNA Quant: <20 copies/mL
LOG10 HIV-1 RNA: UNDETERMINED log10copy/mL

## 2022-12-18 MED ORDER — EPOETIN ALFA 10000 UNIT/ML IJ SOLN
INTRAMUSCULAR | Status: AC
  Filled 2022-12-18: qty 1

## 2022-12-18 MED ORDER — DULOXETINE HCL 20 MG PO CPEP: 20.0000 mg | ORAL_CAPSULE | Freq: Every day | ORAL | 1 refills | Status: DC

## 2022-12-18 MED ORDER — ARIPIPRAZOLE 5 MG PO TABS: 7.5000 mg | ORAL_TABLET | Freq: Every day | ORAL | 1 refills | Status: DC

## 2022-12-18 MED ORDER — ISOSORBIDE MONONITRATE ER 60 MG PO TB24
60.0000 mg | ORAL_TABLET | Freq: Every day | ORAL | 0 refills | Status: DC
  Filled 2022-12-18: qty 30, 30d supply, fill #0

## 2022-12-18 MED ORDER — DICLOFENAC SODIUM 1 % EX GEL: 2.0000 g | Freq: Four times a day (QID) | CUTANEOUS | 1 refills | Status: DC

## 2022-12-18 MED ORDER — HYDRALAZINE HCL 50 MG PO TABS: 50.0000 mg | ORAL_TABLET | Freq: Three times a day (TID) | ORAL | 1 refills | Status: DC

## 2022-12-18 MED ORDER — TORSEMIDE 100 MG PO TABS: 100.0000 mg | ORAL_TABLET | Freq: Every morning | ORAL | 1 refills | Status: DC

## 2022-12-18 MED ORDER — ISOSORBIDE MONONITRATE ER 60 MG PO TB24: 60.0000 mg | ORAL_TABLET | Freq: Every day | ORAL | 1 refills | Status: DC

## 2022-12-18 MED ORDER — TRAZODONE HCL 50 MG PO TABS: 25.0000 mg | ORAL_TABLET | Freq: Every evening | ORAL | 1 refills | Status: DC | PRN

## 2022-12-18 MED ORDER — CALCITRIOL 0.25 MCG PO CAPS: 0.2500 ug | ORAL_CAPSULE | ORAL | 1 refills | Status: DC

## 2022-12-18 MED ORDER — CALCIUM ACETATE (PHOS BINDER) 667 MG PO CAPS: 667.0000 mg | ORAL_CAPSULE | Freq: Three times a day (TID) | ORAL | 1 refills | Status: DC

## 2022-12-18 MED ORDER — GENTAMICIN SULFATE 0.1 % EX OINT: TOPICAL_OINTMENT | Freq: Three times a day (TID) | CUTANEOUS | 1 refills | Status: DC

## 2022-12-18 MED ORDER — DICLOFENAC SODIUM 1 % EX GEL
2.0000 g | Freq: Four times a day (QID) | CUTANEOUS | 0 refills | Status: AC
  Filled 2022-12-18: qty 300, 30d supply, fill #0

## 2022-12-18 MED ORDER — SEVELAMER CARBONATE 800 MG PO TABS: 1600.0000 mg | ORAL_TABLET | Freq: Three times a day (TID) | ORAL | 1 refills | Status: DC

## 2022-12-18 MED ORDER — CALCIUM ACETATE (PHOS BINDER) 667 MG PO CAPS
667.0000 mg | ORAL_CAPSULE | Freq: Three times a day (TID) | ORAL | 0 refills | Status: DC
  Filled 2022-12-18: qty 90, 30d supply, fill #0

## 2022-12-18 MED ORDER — DULOXETINE HCL 20 MG PO CPEP
20.0000 mg | ORAL_CAPSULE | Freq: Every day | ORAL | 0 refills | Status: DC
  Filled 2022-12-18: qty 30, 30d supply, fill #0

## 2022-12-18 MED ORDER — PHENYLEPHRINE HCL 0.25 % NA SOLN: 2.0000 | NASAL | 1 refills | Status: DC | PRN

## 2022-12-18 MED ORDER — TRAZODONE HCL 50 MG PO TABS
25.0000 mg | ORAL_TABLET | Freq: Every evening | ORAL | 0 refills | Status: DC | PRN
  Filled 2022-12-18: qty 15, 30d supply, fill #0

## 2022-12-18 MED ORDER — CARVEDILOL 6.25 MG PO TABS: 6.2500 mg | ORAL_TABLET | Freq: Two times a day (BID) | ORAL | 1 refills | Status: DC

## 2022-12-18 MED ORDER — SEVELAMER CARBONATE 800 MG PO TABS
1600.0000 mg | ORAL_TABLET | Freq: Three times a day (TID) | ORAL | 0 refills | Status: DC
  Filled 2022-12-18 – 2022-12-21 (×2): qty 180, 30d supply, fill #0

## 2022-12-18 MED ORDER — CALCITRIOL 0.25 MCG PO CAPS
0.2500 ug | ORAL_CAPSULE | ORAL | 0 refills | Status: DC
  Filled 2022-12-18: qty 12, 28d supply, fill #0

## 2022-12-18 MED ORDER — ARIPIPRAZOLE 5 MG PO TABS
7.5000 mg | ORAL_TABLET | Freq: Every day | ORAL | 0 refills | Status: DC
  Filled 2022-12-18: qty 45, 30d supply, fill #0

## 2022-12-18 MED ORDER — CARVEDILOL 6.25 MG PO TABS
6.2500 mg | ORAL_TABLET | Freq: Two times a day (BID) | ORAL | 0 refills | Status: DC
  Filled 2022-12-18: qty 60, 30d supply, fill #0

## 2022-12-18 MED ORDER — TORSEMIDE 100 MG PO TABS
100.0000 mg | ORAL_TABLET | Freq: Every morning | ORAL | 0 refills | Status: DC
  Filled 2022-12-18: qty 30, 30d supply, fill #0

## 2022-12-18 MED ORDER — GENTAMICIN SULFATE 0.1 % EX OINT
TOPICAL_OINTMENT | Freq: Three times a day (TID) | CUTANEOUS | 0 refills | Status: AC
  Filled 2022-12-18: qty 30, 10d supply, fill #0

## 2022-12-18 MED ORDER — HYDRALAZINE HCL 50 MG PO TABS
50.0000 mg | ORAL_TABLET | Freq: Three times a day (TID) | ORAL | 0 refills | Status: DC
  Filled 2022-12-18: qty 90, 30d supply, fill #0

## 2022-12-18 MED ORDER — PHENYLEPHRINE HCL 0.25 % NA SOLN
2.0000 | NASAL | 0 refills | Status: AC | PRN
  Filled 2022-12-18: qty 15, 13d supply, fill #0

## 2022-12-18 NOTE — Progress Notes (Signed)
[facility]  ROUNDING NOTE   Subjective:   Patient seen and evaluated during dialysis   HEMODIALYSIS FLOWSHEET:  Blood Flow Rate (mL/min): 350 mL/min Arterial Pressure (mmHg): -100 mmHg Venous Pressure (mmHg): 190 mmHg TMP (mmHg): 22 mmHg Ultrafiltration Rate (mL/min): 1114 mL/min Dialysate Flow Rate (mL/min): 300 ml/min Dialysis Fluid Bolus: Normal Saline  Sitting in chair, receiving treatment Eating breakfast Appears in good spirits about discharge plan  Objective:  Vital signs in last 24 hours:  Temp:  [97.7 F (36.5 C)-98.2 F (36.8 C)] 98.2 F (36.8 C) (01/19 0817) Pulse Rate:  [105-110] 105 (01/19 1030) Resp:  [16-22] 21 (01/19 1030) BP: (108-131)/(66-87) 114/73 (01/19 1030) SpO2:  [94 %-100 %] 97 % (01/19 1030) Weight:  [91.3 kg-91.6 kg] 91.3 kg (01/19 0817)  Weight change: 1 kg Filed Weights   12/17/22 0842 12/17/22 1158 12/18/22 0817  Weight: 94.6 kg 91.6 kg 91.3 kg    Intake/Output: I/O last 3 completed shifts: In: -  Out: 3000 [Other:3000]   Intake/Output this shift:  No intake/output data recorded.  Physical Exam: General: NAD  Head: Normocephalic, atraumatic. Moist oral mucosal membranes  Eyes: Anicteric  Lungs:  Clear to auscultation, normal effort, room air  Heart: Regular rate and rhythm  Abdomen:  Soft, nontender  Extremities: 1+ peripheral edema.  Neurologic: Alert and oriented, moving all four extremities  Skin: No lesions  Access: Left aVF    Basic Metabolic Panel: Recent Labs  Lab 12/12/22 0817 12/13/22 0724 12/14/22 0226 12/16/22 0931 12/18/22 0844  NA 137 136 136 135 137  K 4.7 5.5* 5.4* 4.6 5.1  CL 102 101 101 97* 101  CO2 23 23 25 26 24   GLUCOSE 88 83 93 121* 82  BUN 74* 91* 92* 81* 88*  CREATININE 5.93* 6.99* 7.81* 5.82* 6.86*  CALCIUM 9.4 9.4 9.2 8.9 9.3  MG  --  2.2 2.2  --   --   PHOS  --  6.4* 6.3* 5.0* 6.3*     Liver Function Tests: Recent Labs  Lab 12/13/22 0724 12/14/22 0226  12/16/22 0931 12/18/22 0844  ALBUMIN 2.6* 2.6* 2.4* 2.4*    No results for input(s): "LIPASE", "AMYLASE" in the last 168 hours. No results for input(s): "AMMONIA" in the last 168 hours.  CBC: Recent Labs  Lab 12/12/22 0817 12/12/22 1619 12/13/22 0724 12/16/22 0931 12/18/22 0844  WBC 6.5 7.3 7.4 8.3 8.6  HGB 9.0* 8.9* 9.1* 7.4* 7.1*  HCT 29.0* 28.6* 29.1* 24.5* 23.6*  MCV 92.7 93.5 92.4 92.8 94.4  PLT 133* 134* 159 171 176     Cardiac Enzymes: No results for input(s): "CKTOTAL", "CKMB", "CKMBINDEX", "TROPONINI" in the last 168 hours.  BNP: Invalid input(s): "POCBNP"  CBG: Recent Labs  Lab 12/11/22 2053  GLUCAP 125*     Microbiology: Results for orders placed or performed during the hospital encounter of 12/09/22  MRSA Next Gen by PCR, Nasal     Status: None   Collection Time: 12/11/22 10:34 AM   Specimen: Nasal Mucosa; Nasal Swab  Result Value Ref Range Status   MRSA by PCR Next Gen NOT DETECTED NOT DETECTED Final    Comment: (NOTE) The GeneXpert MRSA Assay (FDA approved for NASAL specimens only), is one component of a comprehensive MRSA colonization surveillance program. It is not intended to diagnose MRSA infection nor to guide or monitor treatment for MRSA infections. Test performance is not FDA approved in patients less than 67 years old. Performed at [facility], [address] [place], [place] [address]     Coagulation Studies: No results for input(s): "LABPROT", "INR" in the last 72 hours.  Urinalysis: No results for input(s): "COLORURINE", "LABSPEC", "PHURINE", "GLUCOSEU", "HGBUR", "BILIRUBINUR", "KETONESUR", "PROTEINUR", "UROBILINOGEN", "NITRITE", "LEUKOCYTESUR" in the last 72 hours.  Invalid input(s): "APPERANCEUR"    Imaging: No results found.   Medications:    anticoagulant sodium citrate     anticoagulant sodium citrate      ARIPiprazole  7.5 mg Oral Daily   calcitRIOL  0.25 mcg Oral Q T,Th,Sa-HD   calcium acetate   667 mg Oral TID WC   carvedilol  6.25 mg Oral BID WC   cefTRIAXone (ROCEPHIN) IM  250 mg Intramuscular Once   Chlorhexidine Gluconate Cloth  6 each Topical Q0600   diclofenac Sodium  2 g Topical QID   DULoxetine  20 mg Oral Daily   epoetin (EPOGEN/PROCRIT) injection  10,000 Units Intravenous Q M,W,F-HD   gentamicin ointment   Topical TID   heparin  5,000 Units Dialysis Once in dialysis   hydrALAZINE  50 mg Oral Q8H   isosorbide mononitrate  60 mg Oral Daily   sevelamer carbonate  1,600 mg Oral TID WC   torsemide  100 mg Oral q morning   acetaminophen, alteplase, alteplase, alum & mag hydroxide-simeth, anticoagulant sodium citrate, anticoagulant sodium citrate, heparin, heparin, lidocaine (PF), lidocaine (PF), lidocaine-prilocaine, lidocaine-prilocaine, magnesium hydroxide, oxyCODONE, pentafluoroprop-tetrafluoroeth, pentafluoroprop-tetrafluoroeth, traZODone  Assessment/ Plan:  [name] is a 27 y.o.  female with past medical conditions including CHF, hypertension, PTSD, borderline personality disorder, and end-stage renal disease on hemodialysis, who was admitted to [facility] on 12/09/2022 for  Severe recurrent major depression without psychotic features ([place]) [F33.2]  [facility] [place]/MWF/left aVF  End-stage renal disease on hemodialysis.   Patient received UF only treatment yesterday with 3L achieved. Scheduled treatment today with UF 2.5-3L as tolerated. Patient requesting additional treatment tomorrow for fluid removal.    2. Anemia of chronic kidney disease Lab Results  Component Value Date   HGB 7.1 (L) 12/18/2022    Hgb below desired target. Continue EPO with routine dialysis.  3. Secondary Hyperparathyroidism: with outpatient labs: PTH 886, phosphorus 8.0, calcium 9.6 on 10/30/2022.              Phosphorus continues to fluctuate. Continue calcium acetate with meals.   4.  Hypertension with chronic kidney disease.  Patient currently prescribed carvedilol, hydralazine,  isosorbide, and torsemide.  Currently receiving these medications.  Blood pressure stable for this patient.  5.  Hyperkalemia likely due to poorly monitored diet.  Will correct with dialysis   LOS: [place] 1/19/202410:55 AM

## 2022-12-18 NOTE — Progress Notes (Signed)
12/16/22 0856 12/16/22 0930 12/16/22 1000  Vitals  Temp  --   --   --   Temp Source  --   --   --   BP 127/67 124/85 130/71  MAP (mmHg) 84 95 90  BP Location Right Wrist Right Wrist Right Wrist  BP Method Automatic Automatic Automatic  Patient Position (if appropriate) Sitting Sitting Sitting  Pulse Rate (!) 104 (!) 105 (!) 104  Pulse Rate Source Monitor Monitor Monitor  ECG Heart Rate (!) 104 (!) 105 (!) 104  Resp (!) 23 (!) 25 (!) 22  During Treatment Monitoring  Blood Flow Rate (mL/min) 400 mL/min 400 mL/min 400 mL/min  Arterial Pressure (mmHg) -130 mmHg -130 mmHg -130 mmHg  Venous Pressure (mmHg) 230 mmHg 230 mmHg 230 mmHg  TMP (mmHg) 12 mmHg 13 mmHg 15 mmHg  Ultrafiltration Rate (mL/min) 1000 mL/min 1000 mL/min 1000 mL/min  Dialysate Flow Rate (mL/min) 300 ml/min 300 ml/min 300 ml/min  HD Safety Checks Performed Yes Yes Yes  Intra-Hemodialysis Comments Tx initiated Progressing as prescribed Progressing as prescribed  Dialysis Fluid Bolus Normal Saline  --   --   Dialysate Change 2K;2.5 Ca  --   --     12/16/22 1030 12/16/22 1100 12/16/22 1130  Vitals  Temp  --   --   --   Temp Source  --   --   --   BP 124/82 133/81 132/87  MAP (mmHg) 94 96 101  BP Location Right Wrist Right Wrist Right Wrist  BP Method Automatic Automatic Automatic  Patient Position (if appropriate) Sitting Sitting Sitting  Pulse Rate (!) 104 (!) 106 (!) 105  Pulse Rate Source Monitor Monitor Monitor  ECG Heart Rate (!) 104 (!) 106 (!) 105  Resp (!) 24 (!) 23 (!) 22  During Treatment Monitoring  Blood Flow Rate (mL/min) 400 mL/min 400 mL/min 400 mL/min  Arterial Pressure (mmHg) -130 mmHg -120 mmHg -130 mmHg  Venous Pressure (mmHg) 220 mmHg 220 mmHg 220 mmHg  TMP (mmHg) 15 mmHg 15 mmHg 14 mmHg  Ultrafiltration Rate (mL/min) 1000 mL/min 962 mL/min 963 mL/min  Dialysate Flow Rate (mL/min) 300 ml/min 300 ml/min 300 ml/min  HD Safety Checks Performed Yes Yes Yes  Intra-Hemodialysis Comments  Progressing as prescribed Progressing as prescribed Progressing as prescribed  Dialysis Fluid Bolus  --   --   --   Dialysate Change  --   --   --     12/16/22 1200 12/16/22 1233 12/17/22 0857  Vitals  Temp  --  98 F (36.7 C)  --   Temp Source  --  Oral  --   BP (!) 137/102 (!) 136/103 123/71  MAP (mmHg) 113 113 84  BP Location Right Wrist Right Wrist Right Wrist  BP Method Automatic Automatic Automatic  Patient Position (if appropriate) Sitting Sitting Sitting  Pulse Rate (!) 104 (!) 104 (!) 112  Pulse Rate Source Monitor Monitor Monitor  ECG Heart Rate (!) 104 (!) 106 (!) 109  Resp 20 (!) 24 (!) 22  During Treatment Monitoring  Blood Flow Rate (mL/min) 400 mL/min  --  400 mL/min  Arterial Pressure (mmHg) -130 mmHg  --  -140 mmHg  Venous Pressure (mmHg) 230 mmHg  --  230 mmHg  TMP (mmHg) 14 mmHg  --  -4 mmHg  Ultrafiltration Rate (mL/min) 963 mL/min  --  1267 mL/min  Dialysate Flow Rate (mL/min) 300 ml/min  --   (Uf Only Tx)  HD Safety Checks Performed  Yes  --  Yes  Intra-Hemodialysis Comments Progressing as prescribed Tx completed;Tolerated well Tx initiated  Dialysis Fluid Bolus  --   --   --   Dialysate Change  --   --   --     12/17/22 0900 12/17/22 0930 12/17/22 1000  Vitals  Temp  --   --   --   Temp Source  --   --   --   BP 120/73 128/82 119/76  MAP (mmHg) 84 97 89  BP Location Right Wrist Right Wrist Right Wrist  BP Method Automatic Automatic Automatic  Patient Position (if appropriate) Sitting Sitting Sitting  Pulse Rate (!) 111 (!) 112 (!) 107  Pulse Rate Source Monitor Monitor Monitor  ECG Heart Rate (!) 110  --  (!) 109  Resp (!) 26 (!) 23 20  During Treatment Monitoring  Blood Flow Rate (mL/min) 400 mL/min 400 mL/min 400 mL/min  Arterial Pressure (mmHg) -140 mmHg -140 mmHg -140 mmHg  Venous Pressure (mmHg) 230 mmHg 230 mmHg 230 mmHg  TMP (mmHg) -3 mmHg -1 mmHg -1 mmHg  Ultrafiltration Rate (mL/min) 1267 mL/min 1267 mL/min 1266 mL/min  Dialysate Flow  Rate (mL/min)  --   --   --   HD Safety Checks Performed Yes Yes Yes  Intra-Hemodialysis Comments Progressing as prescribed Progressing as prescribed Progressing as prescribed  Dialysis Fluid Bolus  --   --   --   Dialysate Change  --   --   --     12/17/22 1030 12/17/22 1100 12/17/22 1130  Vitals  Temp  --   --   --   Temp Source  --   --   --   BP 126/71 120/82 128/87  MAP (mmHg) 87 94 99  BP Location Right Wrist Right Wrist Right Wrist  BP Method Automatic Automatic Automatic  Patient Position (if appropriate) Lying Lying Lying  Pulse Rate (!) 109 (!) 107 (!) 105  Pulse Rate Source Monitor Monitor Monitor  ECG Heart Rate (!) 109 (!) 107 (!) 105  Resp 20 16 (!) 21  During Treatment Monitoring  Blood Flow Rate (mL/min) 400 mL/min 400 mL/min 400 mL/min  Arterial Pressure (mmHg) -140 mmHg -140 mmHg -150 mmHg  Venous Pressure (mmHg) 240 mmHg 230 mmHg 230 mmHg  TMP (mmHg) -1 mmHg -1 mmHg -3 mmHg  Ultrafiltration Rate (mL/min) 1266 mL/min 1266 mL/min 1096 mL/min  Dialysate Flow Rate (mL/min)  --   --   --   HD Safety Checks Performed Yes Yes Yes  Intra-Hemodialysis Comments Progressing as prescribed Progressing as prescribed Progressing as prescribed  Dialysis Fluid Bolus  --   --   --   Dialysate Change  --   --   --     12/17/22 1158 12/18/22 0839 12/18/22 0900  Vitals  Temp 97.7 F (36.5 C)  --   --   Temp Source Oral  --   --   BP 122/81 121/81 121/82  MAP (mmHg) 93 94 95  BP Location Right Wrist Right Arm Right Arm  BP Method Automatic Automatic Automatic  Patient Position (if appropriate) Lying Lying Lying  Pulse Rate (!) 105 (!) 110 (!) 110  Pulse Rate Source Monitor Monitor Monitor  ECG Heart Rate (!) 104 (!) 110 (!) 109  Resp (!) 22 17 (!) 21  During Treatment Monitoring  Blood Flow Rate (mL/min)  --  350 mL/min (machine alarming  dec. bfr to 350) 350 mL/min  Arterial Pressure (mmHg)  --  -  90 mmHg -100 mmHg  Venous Pressure (mmHg)  --  200 mmHg 200 mmHg  TMP  (mmHg)  --  0 mmHg 21 mmHg  Ultrafiltration Rate (mL/min)  --  1111 mL/min 1114 mL/min  Dialysate Flow Rate (mL/min)  --  300 ml/min 300 ml/min  HD Safety Checks Performed Yes Yes Yes  Intra-Hemodialysis Comments Tx completed;Tolerated well Tx initiated Progressing as prescribed  Dialysis Fluid Bolus  --   --   --   Dialysate Change  --   --   --     12/18/22 0930 12/18/22 1000 12/18/22 1030  Vitals  Temp  --   --   --   Temp Source  --   --   --   BP 111/75 108/66 114/73  MAP (mmHg) 84 79 83  BP Location Right Arm Right Arm Right Arm  BP Method Automatic Automatic Automatic  Patient Position (if appropriate) Lying Lying Lying  Pulse Rate (!) 108 (!) 108 (!) 105  Pulse Rate Source Monitor Monitor Monitor  ECG Heart Rate (!) 109 (!) 107 (!) 105  Resp 19 (!) 22 (!) 21  During Treatment Monitoring  Blood Flow Rate (mL/min) 350 mL/min 350 mL/min 350 mL/min  Arterial Pressure (mmHg) -110 mmHg -100 mmHg -100 mmHg  Venous Pressure (mmHg) 190 mmHg 190 mmHg 190 mmHg  TMP (mmHg) 21 mmHg 22 mmHg 22 mmHg  Ultrafiltration Rate (mL/min) 1114 mL/min 1114 mL/min 1114 mL/min  Dialysate Flow Rate (mL/min) 300 ml/min 300 ml/min 300 ml/min  HD Safety Checks Performed Yes Yes Yes  Intra-Hemodialysis Comments Progressing as prescribed Progressing as prescribed Progressing as prescribed;Tolerated well  Dialysis Fluid Bolus  --   --   --   Dialysate Change  --   --   --     12/18/22 1100 12/18/22 1130 12/18/22 1200  Vitals  Temp  --   --   --   Temp Source  --   --   --   BP 113/75 116/79 120/74  MAP (mmHg) 86 89 87  BP Location Right Arm Right Arm Right Arm  BP Method Automatic Automatic Automatic  Patient Position (if appropriate) Lying Lying Lying  Pulse Rate (!) 105 (!) 107 (!) 104  Pulse Rate Source Monitor Monitor Monitor  ECG Heart Rate (!) 104 (!) 107 (!) 106  Resp 19 17 (!) 21  During Treatment Monitoring  Blood Flow Rate (mL/min) 350 mL/min 350 mL/min 350 mL/min  Arterial Pressure  (mmHg) -60 mmHg -90 mmHg -100 mmHg  Venous Pressure (mmHg) 180 mmHg 240 mmHg 250 mmHg  TMP (mmHg) 13 mmHg 23 mmHg 23 mmHg  Ultrafiltration Rate (mL/min) 1114 mL/min 1115 mL/min 1115 mL/min  Dialysate Flow Rate (mL/min) 300 ml/min 300 ml/min 300 ml/min  HD Safety Checks Performed Yes Yes Yes  Intra-Hemodialysis Comments Progressing as prescribed Progressing as prescribed Progressing as prescribed  Dialysis Fluid Bolus  --   --   --   Dialysate Change  --   --   --     12/18/22 1219  Vitals  Temp 98.3 F (36.8 C)  Temp Source Oral  BP 114/77  MAP (mmHg) 88  BP Location Right Arm  BP Method Automatic  Patient Position (if appropriate) Lying  Pulse Rate (!) 106  Pulse Rate Source Monitor  ECG Heart Rate (!) 106  Resp (!) 22  During Treatment Monitoring  Blood Flow Rate (mL/min)  --   Arterial Pressure (mmHg)  --   Venous Pressure (mmHg)  --  TMP (mmHg)  --   Ultrafiltration Rate (mL/min)  --   Dialysate Flow Rate (mL/min)  --   HD Safety Checks Performed Yes  Intra-Hemodialysis Comments Tx completed;Tolerated well  Dialysis Fluid Bolus  --   Dialysate Change  --
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
## 2022-12-18 NOTE — Progress Notes (Signed)
Pt ran for 3.5hrs with 3.0L fluid removal.  Tolerated tx well     12/18/22 1219  Vitals  Temp 98.3 F (36.8 C)  Temp Source Oral  BP 114/77  MAP (mmHg) 88  BP Location Right Arm  BP Method Automatic  Patient Position (if appropriate) Lying  Pulse Rate (!) 106  Pulse Rate Source Monitor  ECG Heart Rate (!) 106  Resp (!) 22  Oxygen Therapy  SpO2 97 %  O2 Device Room Air  Patient Activity (if Appropriate) In chair  Pulse Oximetry Type Continuous  During Treatment Monitoring  HD Safety Checks Performed Yes  Intra-Hemodialysis Comments Tx completed;Tolerated well  Post Treatment  Dialyzer Clearance Lightly streaked  Duration of HD Treatment -hour(s) 3.5 hour(s)  Liters Processed 73.6  Fluid Removed (mL) 3000 mL  Tolerated HD Treatment Yes  AVG/AVF Arterial Site Held (minutes) 10 minutes  AVG/AVF Venous Site Held (minutes) 10 minutes  Fistula / Graft Left Upper arm  No placement date or time found.   Orientation: Left  Access Location: Upper arm  Site Condition No complications  Fistula / Graft Assessment Present;Thrill;Bruit  Status Deaccessed  Drainage Description None

## 2022-12-18 NOTE — Discharge Planning (Signed)
Working on transferring patient to [facility].

## 2022-12-18 NOTE — Group Note (Signed)
LCSW Group Therapy Note  Group Date: 12/18/2022 Start Time: 1300 End Time: 1400   Type of Therapy and Topic:  Group Therapy - Healthy vs Unhealthy Coping Skills  Participation Level:  Active   Description of Group The focus of this group was to determine what unhealthy coping techniques typically are used by group members and what healthy coping techniques would be helpful in coping with various problems. Patients were guided in becoming aware of the differences between healthy and unhealthy coping techniques. Patients were asked to identify 2-3 healthy coping skills they would like to learn to use more effectively.  Therapeutic Goals Patients learned that coping is what human beings do all day long to deal with various situations in their lives Patients defined and discussed healthy vs unhealthy coping techniques Patients identified their preferred coping techniques and identified whether these were healthy or unhealthy Patients determined 2-3 healthy coping skills they would like to become more familiar with and use more often. Patients provided support and ideas to each other   Summary of Patient Progress:    Patient was present for the entirety of the group session. Patient was an active listener and participated in the topic of discussion, provided helpful advice to others, and added nuance to topic of conversation. Patient was helpful in discussing defense mechanisms. States she often results to projection as a defense mechanism.   Therapeutic Modalities Cognitive Behavioral Therapy Motivational Interviewing  Natasha Long 12/18/2022  2:46 PM

## 2022-12-18 NOTE — Plan of Care (Signed)
  Problem: Activity: Goal: Interest or engagement in activities will improve Outcome: Progressing   Problem: Education: Goal: Emotional status will improve Outcome: Not Progressing   Problem: Physical Regulation: Goal: Ability to maintain clinical measurements within normal limits will improve Outcome: Not Progressing

## 2022-12-18 NOTE — Progress Notes (Signed)
Pt denies SI/HI/AVH and verbally agrees to approach staff if these become apparent or before harming themselves/others. Rates depression 0/10. Rates anxiety 5/10. Rates pain 8/10. Pt had dialysis today and has done okay  Scheduled medications administered to pt, per MD orders. RN provided support and encouragement to pt. Q15 min safety checks implemented and continued. Pt safe on the unit. RN will continue to monitor and intervene as needed.  12/18/22 0750  Psych Admission Type (Psych Patients Only)  Admission Status Voluntary  Psychosocial Assessment  Patient Complaints Anxiety  Eye Contact Fair  Facial Expression Sad;Anxious  Affect Anxious;Sad  Speech Logical/coherent;Soft  Interaction Minimal  Motor Activity Slow  Appearance/Hygiene In scrubs;Disheveled  Behavior Characteristics Cooperative;Appropriate to situation;Anxious  Mood Anxious;Sad;Pleasant  Aggressive Behavior  Effect No apparent injury  Thought Process  Coherency WDL  Content WDL  Delusions None reported or observed  Perception WDL  Hallucination None reported or observed  Judgment Limited  Confusion None  Danger to Self  Current suicidal ideation? Denies  Danger to Others  Danger to Others None reported or observed

## 2022-12-19 MED ORDER — ARIPIPRAZOLE ER 400 MG IM SRER
400.0000 mg | INTRAMUSCULAR | Status: DC
  Administered 2022-12-19: 400 mg via INTRAMUSCULAR
  Filled 2022-12-19: qty 2

## 2022-12-19 MED ORDER — PENTAFLUOROPROP-TETRAFLUOROETH EX AERO
INHALATION_SPRAY | CUTANEOUS | Status: AC
  Administered 2022-12-19: 1
  Filled 2022-12-19: qty 30

## 2022-12-19 NOTE — Plan of Care (Signed)
  Problem: Education: Goal: Knowledge of [facility] Education information/materials will improve Outcome: Progressing Goal: Verbalization of understanding the information provided will improve Outcome: Progressing   Problem: Activity: Goal: Sleeping patterns will improve Outcome: Not Progressing

## 2022-12-19 NOTE — Progress Notes (Signed)
Patient alert and oriented x 4, no distress noted interacting appropriately with peers and staff, denies SI/HI/AVH, her affect is bright , visible in the milieu interacting with peers.15 minutes safety checks maintained will continue to monitor

## 2022-12-19 NOTE — Progress Notes (Signed)
Received patient in bed to unit.  Alert and oriented.  Informed consent signed and in chart.   Treatment initiated: [place] Treatment completed: 1116  Patient tolerated well.  Transported back to the room  Alert, without acute distress.  Hand-off given to patient's nurse.   Access used: Left AVF Access issues: none  Total UF removed: 3L Medication(s) given: none  Post HD weight: 87.4 kg   [facility]

## 2022-12-19 NOTE — Progress Notes (Signed)
[facility]  ROUNDING NOTE   Subjective:   Patient seen and evaluated during dialysis, additional UF only patient 3L. Fourth consecutive treatment    HEMODIALYSIS FLOWSHEET:  Blood Flow Rate (mL/min): 400 mL/min Arterial Pressure (mmHg): -130 mmHg Venous Pressure (mmHg): 230 mmHg TMP (mmHg): -2 mmHg Ultrafiltration Rate (mL/min): 1147 mL/min Dialysate Flow Rate (mL/min):  (UF only) Dialysis Fluid Bolus: Normal Saline  Sitting in chair, receiving treatment Appears in good spirits about discharge plan States her EDW outpatient is approximately 105 kg.   Objective:  Vital signs in last 24 hours:  Temp:  [97.9 F (36.6 C)-98.4 F (36.9 C)] 97.9 F (36.6 C) (01/20 0730) Pulse Rate:  [101-110] 103 (01/20 1030) Resp:  [17-26] 19 (01/20 1030) BP: (113-132)/(71-90) 122/90 (01/20 1030) SpO2:  [95 %-100 %] 100 % (01/20 1030) Weight:  [88.8 kg-90.1 kg] 90.1 kg (01/20 0726)  Weight change: -3.3 kg Filed Weights   12/18/22 0817 12/18/22 1237 12/19/22 0726  Weight: 91.3 kg 88.8 kg 90.1 kg    Intake/Output: I/O last 3 completed shifts: In: -  Out: 3000 [Other:3000]   Intake/Output this shift:  No intake/output data recorded.  Physical Exam: General: NAD  Head: Normocephalic, atraumatic. Moist oral mucosal membranes  Eyes: Anicteric  Lungs:  Clear to auscultation, normal effort, room air  Heart: Regular rate and rhythm  Abdomen:  Soft, nontender  Extremities: 1+ peripheral edema.  Neurologic: Alert and oriented, moving all four extremities  Skin: No lesions  Access: Left aVF    Basic Metabolic Panel: Recent Labs  Lab 12/13/22 0724 12/14/22 0226 12/16/22 0931 12/18/22 0844  NA 136 136 135 137  K 5.5* 5.4* 4.6 5.1  CL 101 101 97* 101  CO2 23 25 26 24   GLUCOSE 83 93 121* 82  BUN 91* 92* 81* 88*  CREATININE 6.99* 7.81* 5.82* 6.86*  CALCIUM 9.4 9.2 8.9 9.3  MG 2.2 2.2  --   --   PHOS 6.4* 6.3* 5.0* 6.3*     Liver Function Tests: Recent Labs   Lab 12/13/22 0724 12/14/22 0226 12/16/22 0931 12/18/22 0844  ALBUMIN 2.6* 2.6* 2.4* 2.4*    No results for input(s): "LIPASE", "AMYLASE" in the last 168 hours. No results for input(s): "AMMONIA" in the last 168 hours.  CBC: Recent Labs  Lab 12/12/22 1619 12/13/22 0724 12/16/22 0931 12/18/22 0844  WBC 7.3 7.4 8.3 8.6  HGB 8.9* 9.1* 7.4* 7.1*  HCT 28.6* 29.1* 24.5* 23.6*  MCV 93.5 92.4 92.8 94.4  PLT 134* 159 171 176     Cardiac Enzymes: No results for input(s): "CKTOTAL", "CKMB", "CKMBINDEX", "TROPONINI" in the last 168 hours.  BNP: Invalid input(s): "POCBNP"  CBG: No results for input(s): "GLUCAP" in the last 168 hours.   Microbiology: Results for orders placed or performed during the hospital encounter of 12/09/22  MRSA Next Gen by PCR, Nasal     Status: None   Collection Time: 12/11/22 10:34 AM   Specimen: Nasal Mucosa; Nasal Swab  Result Value Ref Range Status   MRSA by PCR Next Gen NOT DETECTED NOT DETECTED Final    Comment: (NOTE) The GeneXpert MRSA Assay (FDA approved for NASAL specimens only), is one component of a comprehensive MRSA colonization surveillance program. It is not intended to diagnose MRSA infection nor to guide or monitor treatment for MRSA infections. Test performance is not FDA approved in patients less than 55 years old. Performed at [facility], [address] [place], [place] [address]  Coagulation Studies: No results for input(s): "LABPROT", "INR" in the last 72 hours.  Urinalysis: No results for input(s): "COLORURINE", "LABSPEC", "PHURINE", "GLUCOSEU", "HGBUR", "BILIRUBINUR", "KETONESUR", "PROTEINUR", "UROBILINOGEN", "NITRITE", "LEUKOCYTESUR" in the last 72 hours.  Invalid input(s): "APPERANCEUR"    Imaging: No results found.   Medications:    anticoagulant sodium citrate     anticoagulant sodium citrate      ARIPiprazole  7.5 mg Oral Daily   calcitRIOL  0.25 mcg Oral Q T,Th,Sa-HD   calcium acetate   667 mg Oral TID WC   carvedilol  6.25 mg Oral BID WC   cefTRIAXone (ROCEPHIN) IM  250 mg Intramuscular Once   Chlorhexidine Gluconate Cloth  6 each Topical Q0600   diclofenac Sodium  2 g Topical QID   DULoxetine  20 mg Oral Daily   epoetin (EPOGEN/PROCRIT) injection  10,000 Units Intravenous Q M,W,F-HD   gentamicin ointment   Topical TID   heparin  5,000 Units Dialysis Once in dialysis   hydrALAZINE  50 mg Oral Q8H   isosorbide mononitrate  60 mg Oral Daily   sevelamer carbonate  1,600 mg Oral TID WC   torsemide  100 mg Oral q morning   acetaminophen, alteplase, alteplase, alum & mag hydroxide-simeth, anticoagulant sodium citrate, anticoagulant sodium citrate, heparin, heparin, lidocaine (PF), lidocaine (PF), lidocaine-prilocaine, lidocaine-prilocaine, magnesium hydroxide, pentafluoroprop-tetrafluoroeth, pentafluoroprop-tetrafluoroeth, traZODone  Assessment/ Plan:  Natasha Long is a 27 y.o.  female with past medical conditions including CHF, hypertension, PTSD, borderline personality disorder, and end-stage renal disease on hemodialysis, who was admitted to [facility] on 12/09/2022 for  Severe recurrent major depression without psychotic features ([place]) [F33.2]  [facility] [place]/MWF/left aVF  End-stage renal disease on hemodialysis.   Patient received treatment yesterday with 3L achieved. Scheduled treatment today with UF 3L as tolerated. No plan for HD tomorrow. Dry weight will need to be adjusted outpatient due to significant fluid removal.  May need albumin with HD on Monday if she is still admitted. Albumin 2.4 on Friday.    2. Anemia of chronic kidney disease Lab Results  Component Value Date   HGB 7.1 (L) 12/18/2022    Hgb below desired target. Continue EPO with routine dialysis.  3. Secondary Hyperparathyroidism: with outpatient labs: PTH 886, phosphorus 8.0, calcium 9.6 on 10/30/2022.              Phosphorus continues to fluctuate. Continue calcium acetate with meals.    4.  Hypertension with chronic kidney disease.  Patient currently prescribed carvedilol, hydralazine, isosorbide, and torsemide.  Currently receiving these medications.  Blood pressure stable for this patient. BP 129/73  5.  Hyperkalemia likely due to poorly monitored diet.  Will correct with dialysis   LOS: [place] 1/20/202410:50 AM

## 2022-12-19 NOTE — Progress Notes (Signed)
Patient denies SI/HI/AVH. She presents with a sad affect but pleasant mood. She denies anxiety and depression. No complaints of pain. Patient was dialyzed today. Per report it was a successful treatment. Lunch consumed and meds adm. After med adm, patient went to room to rest. No nosebleeds observed or reported at this time.  Q15 minute unit checks in place.

## 2022-12-19 NOTE — Progress Notes (Signed)
Abilify Maintena 400 mg adm IM in R deltoid without difficulty at approx 1715. Patient tolerated well. Education provided prior to adm. Patient verbalized understanding.

## 2022-12-19 NOTE — Progress Notes (Signed)
[facility] MD Progress Note  12/19/2022 1:31 PM Natasha Long  MRN:  [id number] Subjective: Follow-up 27 year old woman.  Patient had dialysis this morning.  Seen afterwards she has no new complaints.  Feeling comfortable.  Mood stable.  No suicidal thoughts.  She understands that the tentative plan is for discharge Monday.  She is requesting that we switch her over to Abilify maintainer.  She says she has been on that before. Principal Problem: Severe recurrent major depression without psychotic features ([place]) Diagnosis: Principal Problem:   Severe recurrent major depression without psychotic features ([place]) Active Problems:   PTSD (post-traumatic stress disorder)   HTN (hypertension)   Borderline personality disorder (HCC)   Moderate cannabis use disorder (HCC)   ESRD (end stage renal disease) (HCC)   Acute on chronic diastolic CHF (congestive heart failure) (HCC)   Chronic back pain   Homelessness  Total Time spent with patient: 30 minutes  Past Psychiatric History: Past history of PTSD mood disorder borderline personality disorder chronic pain and multiple medical problems  Past Medical History:  Past Medical History:  Diagnosis Date   Asthma    as a child, no problem as an adult, no inhaler   CHF (congestive heart failure) (HCC)    Complication of anesthesia    woke up before tube removed, 1 time fought nurses   ESRD on hemodialysis ([place])    M-W-F   History of borderline personality disorder    Hypertension    diagnosed as child; stopped meds at 75 yo   Insomnia    Neuromuscular disorder ([place])    peripheral neuropathy   Nonspecific chest pain    PTSD (post-traumatic stress disorder)     Past Surgical History:  Procedure Laterality Date   AV FISTULA PLACEMENT Left 10/18/2020   Procedure: LEFT ARM ARTERIOVENOUS (AV) FISTULA CREATION;  Surgeon: Serafina Mitchell, MD;  Location: [facility];  Service: Vascular;  Laterality: Left;   CHOLECYSTECTOMY     extraction of wisdom teeth      FISTULA SUPERFICIALIZATION Left 02/13/2021   Procedure: LEFT BRACHIOCEPHALIC ARTERIOVENOUS FISTULA SUPERFICIALIZATION;  Surgeon: Serafina Mitchell, MD;  Location: [place];  Service: Vascular;  Laterality: Left;   I & D EXTREMITY Left 01/30/2022   Procedure: IRRIGATION AND DEBRIDEMENT EXTREMITY;  Surgeon: Milly Jakob, MD;  Location: [place];  Service: Orthopedics;  Laterality: Left;   INCISION AND DRAINAGE OF WOUND Left 01/30/2022   Procedure: LEFT WRIST ASPIRATION;  Surgeon: Vanetta Mulders, MD;  Location: [place];  Service: Orthopedics;  Laterality: Left;   KNEE ARTHROSCOPY Right 01/30/2022   Procedure: ARTHROSCOPY KNEE AND IRRIIGATION AND DEBRIDMENT; LEFT WRIST ASPIRATION;  Surgeon: Vanetta Mulders, MD;  Location: [place];  Service: Orthopedics;  Laterality: Right;   RENAL BIOPSY     x 2   TUNNELED VENOUS CATHETER PLACEMENT  02/11/2021   [facility]   Family History:  Family History  Adopted: Yes  Problem Relation Age of Onset   Diabetes Other    Hypertension Other    Family Psychiatric  History: See previous Social History:  Social History   Substance and Sexual Activity  Alcohol Use Not Currently   Alcohol/week: 1.0 standard drink of alcohol   Types: 1 Shots of liquor per week     Social History   Substance and Sexual Activity  Drug Use Yes   Types: Marijuana, Cocaine, Oxycodone   Comment: daily cocaine use; cannabis use 1x weekly; Rx opiates 1x monthly    Social History   Socioeconomic  History   Marital status: Soil scientist    Spouse name: Not on file   Number of children: 1   Years of education: Not on file   Highest education level: 12th grade  Occupational History   Occupation: unemployed  Tobacco Use   Smoking status: Every Day    Packs/day: 0.50    Years: 10.00    Total pack years: 5.00    Types: Cigarettes   Smokeless tobacco: Never  Vaping Use   Vaping Use: Some days  Substance and Sexual Activity   Alcohol use: Not Currently    Alcohol/week: 1.0  standard drink of alcohol    Types: 1 Shots of liquor per week   Drug use: Yes    Types: Marijuana, Cocaine, Oxycodone    Comment: daily cocaine use; cannabis use 1x weekly; Rx opiates 1x monthly   Sexual activity: Yes    Partners: Male    Birth control/protection: None    Comment: reports exchanging sex for drugs  Other Topics Concern   Not on file  Social History Narrative   Not on file   Social Determinants of Health   Financial Resource Strain: Unknown (01/13/2019)   Overall Financial Resource Strain (CARDIA)    Difficulty of Paying Living Expenses: Patient refused  Food Insecurity: Food Insecurity Present (12/14/2022)   Hunger Vital Sign    Worried About Running Out of Food in the Last Year: Often true    Ran Out of Food in the Last Year: Often true  Transportation Needs: Unmet Transportation Needs (12/14/2022)   PRAPARE - Transportation    Lack of Transportation (Medical): Yes    Lack of Transportation (Non-Medical): Yes  Physical Activity: Unknown (01/13/2019)   Exercise Vital Sign    Days of Exercise per Week: Patient refused    Minutes of Exercise per Session: Patient refused  Stress: Unknown (01/13/2019)   [employer] [place] of Stress : Patient refused  Social Connections: Unknown (01/13/2019)   Social Connection and Isolation Panel [NHANES]    Frequency of Communication with Friends and Family: Patient refused    Frequency of Social Gatherings with Friends and Family: Patient refused    Attends Religious Services: Patient refused    Marine scientist or Organizations: Patient refused    Attends Music therapist: Patient refused    Marital Status: Patient refused   Additional Social History:                         Sleep: Fair  Appetite:  Fair  Current Medications: Current Facility-Administered Medications  Medication Dose Route Frequency Provider Last Rate Last  Admin   acetaminophen (TYLENOL) tablet 650 mg  650 mg Oral Q6H PRN Zina Pitzer T, MD   650 mg at 12/18/22 2134   alteplase (CATHFLO ACTIVASE) injection 2 mg  2 mg Intracatheter Once PRN Roy Snuffer T, MD       alteplase (CATHFLO ACTIVASE) injection 2 mg  2 mg Intracatheter Once PRN Dwana Melena, MD       alum & mag hydroxide-simeth (MAALOX/MYLANTA) 200-200-20 MG/5ML suspension 30 mL  30 mL Oral Q4H PRN Burwell Bethel T, MD       anticoagulant sodium citrate solution 5 mL  5 mL Intracatheter PRN Ferlin Fairhurst T, MD       anticoagulant sodium citrate solution 5 mL  5 mL Intracatheter PRN Dwana Melena, MD  ARIPiprazole (ABILIFY) tablet 7.5 mg  7.5 mg Oral Daily Wandy Bossler,  T, MD   7.5 mg at 12/19/22 1317   ARIPiprazole ER (ABILIFY MAINTENA) injection 400 mg  400 mg Intramuscular Q28 days Dacotah Cabello,  T, MD       calcitRIOL (ROCALTROL) capsule 0.25 mcg  0.25 mcg Oral Q T,Th,Sa-HD Mikea Quadros T, MD   0.25 mcg at 12/19/22 1318   calcium acetate (PHOSLO) capsule 667 mg  667 mg Oral TID WC Tameyah Koch T, MD   667 mg at 12/19/22 1318   carvedilol (COREG) tablet 6.25 mg  6.25 mg Oral BID WC Nakeisha Greenhouse,  T, MD   6.25 mg at 12/18/22 2000   cefTRIAXone (ROCEPHIN) injection 250 mg  250 mg Intramuscular Once Emmit Oriley, Madie Reno, MD       Chlorhexidine Gluconate Cloth 2 % PADS 6 each  6 each Topical Q0600 Breeze, Benancio Deeds, NP       diclofenac Sodium (VOLTAREN) 1 % topical gel 2 g  2 g Topical QID Glendene Wyer,  T, MD   2 g at 12/19/22 1323   DULoxetine (CYMBALTA) DR capsule 20 mg  20 mg Oral Daily Tyrisha Benninger T, MD   20 mg at 12/19/22 1318   epoetin alfa (EPOGEN) injection 10,000 Units  10,000 Units Intravenous Q M,W,F-HD Colon Flattery, NP   10,000 Units at 12/18/22 1142   gentamicin ointment (GARAMYCIN) 0.1 %   Topical TID Dalinda Heidt, Madie Reno, MD   Given at 12/19/22 1323   heparin injection 1,000 Units  1,000 Units Intracatheter PRN Colon Flattery, NP       heparin injection 1,000 Units  1,000  Units Intracatheter PRN Dwana Melena, MD       heparin injection 5,000 Units  5,000 Units Dialysis Once in dialysis Dwana Melena, MD       hydrALAZINE (APRESOLINE) tablet 50 mg  50 mg Oral Q8H Leesha Veno T, MD   50 mg at 12/18/22 1305   isosorbide mononitrate (IMDUR) 24 hr tablet 60 mg  60 mg Oral Daily Cesareo Vickrey T, MD   60 mg at 12/19/22 1321   lidocaine (PF) (XYLOCAINE) 1 % injection 5 mL  5 mL Intradermal PRN Breeze, Benancio Deeds, NP       lidocaine (PF) (XYLOCAINE) 1 % injection 5 mL  5 mL Intradermal PRN Dwana Melena, MD       lidocaine-prilocaine (EMLA) cream 1 Application  1 Application Topical PRN , Madie Reno, MD       lidocaine-prilocaine (EMLA) cream 1 Application  1 Application Topical PRN Dwana Melena, MD       magnesium hydroxide (MILK OF MAGNESIA) suspension 30 mL  30 mL Oral Daily PRN , Madie Reno, MD       pentafluoroprop-tetrafluoroeth (GEBAUERS) aerosol 1 Application  1 Application Topical PRN , Madie Reno, MD       pentafluoroprop-tetrafluoroeth (GEBAUERS) aerosol 1 Application  1 Application Topical PRN Dwana Melena, MD       sevelamer carbonate (RENVELA) tablet 1,600 mg  1,600 mg Oral TID WC Allen Basista T, MD   1,600 mg at 12/19/22 1324   torsemide (DEMADEX) tablet 100 mg  100 mg Oral q morning Baley Lorimer T, MD   100 mg at 12/19/22 1324   traZODone (DESYREL) tablet 25 mg  25 mg Oral QHS PRN , Madie Reno, MD   25 mg at 12/18/22 2123    Lab Results:  Results for orders placed or performed during the hospital  encounter of 12/14/22 (from the past 48 hour(s))  CBC     Status: Abnormal   Collection Time: 12/18/22  8:44 AM  Result Value Ref Range   WBC 8.6 4.0 - 10.5 K/uL   RBC 2.50 (L) 3.87 - 5.11 MIL/uL   Hemoglobin 7.1 (L) 12.0 - 15.0 g/dL   HCT 23.6 (L) 36.0 - 46.0 %   MCV 94.4 80.0 - 100.0 fL   MCH 28.4 26.0 - 34.0 pg   MCHC 30.1 30.0 - 36.0 g/dL   RDW 17.8 (H) 11.5 - 15.5 %   Platelets 176 150 - 400 K/uL   nRBC 0.0 0.0 - 0.2 %    Comment:  Performed at [facility], [address] [place], [place] [address]  Renal function panel     Status: Abnormal   Collection Time: 12/18/22  8:44 AM  Result Value Ref Range   Sodium 137 135 - 145 mmol/L   Potassium 5.1 3.5 - 5.1 mmol/L   Chloride 101 98 - 111 mmol/L   CO2 24 22 - 32 mmol/L   Glucose, Bld 82 70 - 99 mg/dL    Comment: Glucose reference range applies only to samples taken after fasting for at least 8 hours.   BUN 88 (H) 6 - 20 mg/dL   Creatinine, Ser 6.86 (H) 0.44 - 1.00 mg/dL   Calcium 9.3 8.9 - 10.3 mg/dL   Phosphorus 6.3 (H) 2.5 - 4.6 mg/dL   Albumin 2.4 (L) 3.5 - 5.0 g/dL   GFR, Estimated 8 (L) >60 mL/min    Comment: (NOTE) Calculated using the CKD-EPI Creatinine Equation (2021)    Anion gap 12 5 - 15    Comment: Performed at [facility], [address] [place], [place] [address]    Blood Alcohol level:  Lab Results  Component Value Date   [facility] <10 12/08/2022   ETH <10 34/19/6222    Metabolic Disorder Labs: Lab Results  Component Value Date   HGBA1C 5.4 12/24/2021   MPG 108 12/24/2021   MPG 105.41 01/16/2021   No results found for: "PROLACTIN" Lab Results  Component Value Date   CHOL 109 10/04/2022   TRIG 66 10/04/2022   HDL 54 10/04/2022   CHOLHDL 2.0 10/04/2022   VLDL 13 10/04/2022   LDLCALC 42 10/04/2022   LDLCALC 54 12/24/2021    Physical Findings: AIMS: Facial and Oral Movements Muscles of Facial Expression: None, normal Lips and Perioral Area: None, normal Jaw: None, normal Tongue: None, normal,Extremity Movements Upper (arms, wrists, hands, fingers): None, normal Lower (legs, knees, ankles, toes): None, normal, Trunk Movements Neck, shoulders, hips: None, normal, Overall Severity Severity of abnormal movements (highest score from questions above): None, normal Incapacitation due to abnormal movements: None, normal Patient's awareness of abnormal movements (rate only patient's report): No Awareness, Dental  Status Current problems with teeth and/or dentures?: No Does patient usually wear dentures?: No  CIWA:    COWS:     Musculoskeletal: Strength & Muscle Tone: within normal limits Gait & Station: unsteady Patient leans: N/A  Psychiatric Specialty Exam:  Presentation  General Appearance:  Casual  Eye Contact: Fair  Speech: Clear and Coherent  Speech Volume: Normal  Handedness: Right   Mood and Affect  Mood: Depressed; Anxious  Affect: Congruent; Depressed   Thought Process  Thought Processes: Coherent  Descriptions of Associations:Intact  Orientation:Full (Time, Place and Person)  Thought Content:Illogical  History of Schizophrenia/Schizoaffective disorder:No  Duration of Psychotic Symptoms:No data recorded Hallucinations:No data recorded Ideas  of Reference:None  Suicidal Thoughts:No data recorded Homicidal Thoughts:No data recorded  Sensorium  Memory: Immediate Good; Recent Good; Remote Good  Judgment: -- (impulsive)  Insight: Lacking   Executive Functions  Concentration: Fair  Attention Span: Fair  Recall: [place] of Knowledge: Good  Language: Good   Psychomotor Activity  Psychomotor Activity:No data recorded  Assets  Assets: Communication Skills; Desire for Improvement; Financial Resources/Insurance   Sleep  Sleep:No data recorded   Physical Exam: Physical Exam Vitals and nursing note reviewed.  Constitutional:      Appearance: Normal appearance.  HENT:     Head: Normocephalic and atraumatic.     Mouth/Throat:     Pharynx: Oropharynx is clear.  Eyes:     Pupils: Pupils are equal, round, and reactive to light.  Cardiovascular:     Rate and Rhythm: Normal rate and regular rhythm.  Pulmonary:     Effort: Pulmonary effort is normal.     Breath sounds: Normal breath sounds.  Abdominal:     General: Abdomen is flat.     Palpations: Abdomen is soft.  Musculoskeletal:        General: Normal range of  motion.  Skin:    General: Skin is warm and dry.  Neurological:     General: No focal deficit present.     Mental Status: She is alert. Mental status is at baseline.  Psychiatric:        Attention and Perception: Attention normal.        Mood and Affect: Mood normal.        Speech: Speech normal.        Behavior: Behavior normal.        Thought Content: Thought content normal.        Cognition and Memory: Cognition normal.    Review of Systems  Constitutional: Negative.   HENT: Negative.    Eyes: Negative.   Respiratory: Negative.    Cardiovascular: Negative.   Gastrointestinal: Negative.   Musculoskeletal: Negative.   Skin: Negative.   Neurological: Negative.   Psychiatric/Behavioral: Negative.     Blood pressure 128/79, pulse (!) 107, temperature 98.4 F (36.9 C), temperature source Oral, resp. rate 20, weight 87.4 kg, SpO2 100 %. Body mass index is 29.3 kg/m.   Treatment Plan Summary: Medication management and Plan although the dose equivalent is a little higher than would be expected patient says she has been on Abilify maintained in the past.  I will go ahead and get her the 400 mg shot.  We can probably discontinue the oral then within a couple of days because of the higher dosing.  Patient agreeable to plan.  No other change to medicine.  We are preparing for discharge on Monday  Alethia Berthold, MD 12/19/2022, 1:31 PM

## 2022-12-19 NOTE — [facility] Group Notes (Signed)
LCSW Group Therapy Note   12/19/2022 1:15pm   Type of Therapy and Topic:  Group Therapy:  Overcoming Obstacles   Participation Level:  Did Not Attend   Description of Group:    In this group patients will be encouraged to explore what they see as obstacles to their own wellness and recovery. They will be guided to discuss their thoughts, feelings, and behaviors related to these obstacles. The group will process together ways to cope with barriers, with attention given to specific choices patients can make. Each patient will be challenged to identify changes they are motivated to make in order to overcome their obstacles. This group will be process-oriented, with patients participating in exploration of their own experiences as well as giving and receiving support and challenge from other group members.   Therapeutic Goals: Patient will identify personal and current obstacles as they relate to admission. Patient will identify barriers that currently interfere with their wellness or overcoming obstacles.  Patient will identify feelings, thought process and behaviors related to these barriers. Patient will identify two changes they are willing to make to overcome these obstacles:      Summary of Patient Progress      Therapeutic Modalities:   Cognitive Behavioral Therapy Solution Focused Therapy Motivational Interviewing Relapse Prevention Therapy  Joanne Chars, LCSW 12/19/2022 2:51 PM

## 2022-12-20 NOTE — Progress Notes (Signed)
No distress noted, patient is S/P dialysis noted sleeping with chest rising and falling noted, will continue to monitor.

## 2022-12-20 NOTE — Progress Notes (Signed)
Condition unchanged, patient denies SI/HI/AVH , compliant with medication regimen. Patient appears less anxious, and receptive to patient. 15 minutes safety checks maintained will continue to monitor

## 2022-12-20 NOTE — [facility] Group Notes (Signed)
[place] Group Notes:  (Nursing/MHT/Case Management/Adjunct)  Date:  12/20/2022  Time:  11:03 AM  Type of Therapy:   community meeting  Participation Level:  Did Not Attend    Antonieta Pert 12/20/2022, 11:03 AM

## 2022-12-20 NOTE — Progress Notes (Signed)
[facility] MD Progress Note  12/20/2022 12:34 PM Natasha Long  MRN:  [id number] Subjective: Follow-up patient with major depression and multiple medical problems.  No new complaints today.  Sleeping in this morning feeling tired but no active suicidal ideation not acting out no psychosis. Principal Problem: Severe recurrent major depression without psychotic features ([place]) Diagnosis: Principal Problem:   Severe recurrent major depression without psychotic features ([place]) Active Problems:   PTSD (post-traumatic stress disorder)   HTN (hypertension)   Borderline personality disorder (HCC)   Moderate cannabis use disorder (HCC)   ESRD (end stage renal disease) (HCC)   Acute on chronic diastolic CHF (congestive heart failure) (HCC)   Chronic back pain   Homelessness  Total Time spent with patient: 30 minutes  Past Psychiatric History: Past history of recurrent depression and PTSD substance abuse issues  Past Medical History:  Past Medical History:  Diagnosis Date   Asthma    as a child, no problem as an adult, no inhaler   CHF (congestive heart failure) (HCC)    Complication of anesthesia    woke up before tube removed, 1 time fought nurses   ESRD on hemodialysis ([place])    M-W-F   History of borderline personality disorder    Hypertension    diagnosed as child; stopped meds at 63 yo   Insomnia    Neuromuscular disorder ([place])    peripheral neuropathy   Nonspecific chest pain    PTSD (post-traumatic stress disorder)     Past Surgical History:  Procedure Laterality Date   AV FISTULA PLACEMENT Left 10/18/2020   Procedure: LEFT ARM ARTERIOVENOUS (AV) FISTULA CREATION;  Surgeon: Serafina Mitchell, MD;  Location: [facility];  Service: Vascular;  Laterality: Left;   CHOLECYSTECTOMY     extraction of wisdom teeth     FISTULA SUPERFICIALIZATION Left 02/13/2021   Procedure: LEFT BRACHIOCEPHALIC ARTERIOVENOUS FISTULA SUPERFICIALIZATION;  Surgeon: Serafina Mitchell, MD;  Location: [place];  Service:  Vascular;  Laterality: Left;   I & D EXTREMITY Left 01/30/2022   Procedure: IRRIGATION AND DEBRIDEMENT EXTREMITY;  Surgeon: Milly Jakob, MD;  Location: [place];  Service: Orthopedics;  Laterality: Left;   INCISION AND DRAINAGE OF WOUND Left 01/30/2022   Procedure: LEFT WRIST ASPIRATION;  Surgeon: Vanetta Mulders, MD;  Location: [place];  Service: Orthopedics;  Laterality: Left;   KNEE ARTHROSCOPY Right 01/30/2022   Procedure: ARTHROSCOPY KNEE AND IRRIIGATION AND DEBRIDMENT; LEFT WRIST ASPIRATION;  Surgeon: Vanetta Mulders, MD;  Location: [place];  Service: Orthopedics;  Laterality: Right;   RENAL BIOPSY     x 2   TUNNELED VENOUS CATHETER PLACEMENT  02/11/2021   [facility]   Family History:  Family History  Adopted: Yes  Problem Relation Age of Onset   Diabetes Other    Hypertension Other    Family Psychiatric  History: See previous Social History:  Social History   Substance and Sexual Activity  Alcohol Use Not Currently   Alcohol/week: 1.0 standard drink of alcohol   Types: 1 Shots of liquor per week     Social History   Substance and Sexual Activity  Drug Use Yes   Types: Marijuana, Cocaine, Oxycodone   Comment: daily cocaine use; cannabis use 1x weekly; Rx opiates 1x monthly    Social History   Socioeconomic History   Marital status: Soil scientist    Spouse name: Not on file   Number of children: 1   Years of education: Not on file   Highest education  level: 12th grade  Occupational History   Occupation: unemployed  Tobacco Use   Smoking status: Every Day    Packs/day: 0.50    Years: 10.00    Total pack years: 5.00    Types: Cigarettes   Smokeless tobacco: Never  Vaping Use   Vaping Use: Some days  Substance and Sexual Activity   Alcohol use: Not Currently    Alcohol/week: 1.0 standard drink of alcohol    Types: 1 Shots of liquor per week   Drug use: Yes    Types: Marijuana, Cocaine, Oxycodone    Comment: daily cocaine use; cannabis use 1x weekly; Rx  opiates 1x monthly   Sexual activity: Yes    Partners: Male    Birth control/protection: None    Comment: reports exchanging sex for drugs  Other Topics Concern   Not on file  Social History Narrative   Not on file   Social Determinants of Health   Financial Resource Strain: Unknown (01/13/2019)   Overall Financial Resource Strain (CARDIA)    Difficulty of Paying Living Expenses: Patient refused  Food Insecurity: Food Insecurity Present (12/14/2022)   Hunger Vital Sign    Worried About Running Out of Food in the Last Year: Often true    Ran Out of Food in the Last Year: Often true  Transportation Needs: Unmet Transportation Needs (12/14/2022)   PRAPARE - Hydrologist (Medical): Yes    Lack of Transportation (Non-Medical): Yes  Physical Activity: Unknown (01/13/2019)   Exercise Vital Sign    Days of Exercise per Week: Patient refused    Minutes of Exercise per Session: Patient refused  Stress: Unknown (01/13/2019)   [employer] [place] of Stress : Patient refused  Social Connections: Unknown (01/13/2019)   Social Connection and Isolation Panel [NHANES]    Frequency of Communication with Friends and Family: Patient refused    Frequency of Social Gatherings with Friends and Family: Patient refused    Attends Religious Services: Patient refused    Marine scientist or Organizations: Patient refused    Attends Music therapist: Patient refused    Marital Status: Patient refused   Additional Social History:                         Sleep: Fair  Appetite:  Fair  Current Medications: Current Facility-Administered Medications  Medication Dose Route Frequency Provider Last Rate Last Admin   acetaminophen (TYLENOL) tablet 650 mg  650 mg Oral Q6H PRN Roshanna Cimino T, MD   650 mg at 12/20/22 0823   alteplase (CATHFLO ACTIVASE) injection 2 mg  2 mg Intracatheter  Once PRN Dequon Schnebly, Madie Reno, MD       alteplase (CATHFLO ACTIVASE) injection 2 mg  2 mg Intracatheter Once PRN Dwana Melena, MD       alum & mag hydroxide-simeth (MAALOX/MYLANTA) 200-200-20 MG/5ML suspension 30 mL  30 mL Oral Q4H PRN Quandra Fedorchak T, MD       anticoagulant sodium citrate solution 5 mL  5 mL Intracatheter PRN Karmine Kauer T, MD       anticoagulant sodium citrate solution 5 mL  5 mL Intracatheter PRN Dwana Melena, MD       ARIPiprazole (ABILIFY) tablet 7.5 mg  7.5 mg Oral Daily Naleigha Raimondi T, MD   7.5 mg at 12/20/22 0823   ARIPiprazole ER (ABILIFY MAINTENA) injection  400 mg  400 mg Intramuscular Q28 days Laurieanne Galloway, Madie Reno, MD   400 mg at 12/19/22 1712   calcitRIOL (ROCALTROL) capsule 0.25 mcg  0.25 mcg Oral Q T,Th,Sa-HD Cuma Polyakov T, MD   0.25 mcg at 12/19/22 1318   calcium acetate (PHOSLO) capsule 667 mg  667 mg Oral TID WC Cherisa Brucker T, MD   667 mg at 12/20/22 1231   carvedilol (COREG) tablet 6.25 mg  6.25 mg Oral BID WC Daquane Aguilar T, MD   6.25 mg at 12/20/22 5852   cefTRIAXone (ROCEPHIN) injection 250 mg  250 mg Intramuscular Once Renarda Mullinix, Madie Reno, MD       Chlorhexidine Gluconate Cloth 2 % PADS 6 each  6 each Topical Q0600 Colon Flattery, NP   6 each at 12/20/22 0630   diclofenac Sodium (VOLTAREN) 1 % topical gel 2 g  2 g Topical QID Amenah Tucci T, MD   2 g at 12/20/22 0829   DULoxetine (CYMBALTA) DR capsule 20 mg  20 mg Oral Daily Tyeesha Riker, Madie Reno, MD   20 mg at 12/20/22 7782   epoetin alfa (EPOGEN) injection 10,000 Units  10,000 Units Intravenous Q M,W,F-HD Colon Flattery, NP   10,000 Units at 12/18/22 1142   gentamicin ointment (GARAMYCIN) 0.1 %   Topical TID Lanice Folden, Madie Reno, MD   Given at 12/20/22 4235   heparin injection 1,000 Units  1,000 Units Intracatheter PRN Colon Flattery, NP       heparin injection 1,000 Units  1,000 Units Intracatheter PRN Dwana Melena, MD       heparin injection 5,000 Units  5,000 Units Dialysis Once in dialysis Dwana Melena, MD        hydrALAZINE (APRESOLINE) tablet 50 mg  50 mg Oral Q8H Jacquelynn Friend T, MD   50 mg at 12/20/22 3614   isosorbide mononitrate (IMDUR) 24 hr tablet 60 mg  60 mg Oral Daily Burdette Gergely T, MD   60 mg at 12/20/22 4315   lidocaine (PF) (XYLOCAINE) 1 % injection 5 mL  5 mL Intradermal PRN Colon Flattery, NP       lidocaine (PF) (XYLOCAINE) 1 % injection 5 mL  5 mL Intradermal PRN Dwana Melena, MD       lidocaine-prilocaine (EMLA) cream 1 Application  1 Application Topical PRN Dyllan Kats, Madie Reno, MD       lidocaine-prilocaine (EMLA) cream 1 Application  1 Application Topical PRN Dwana Melena, MD       magnesium hydroxide (MILK OF MAGNESIA) suspension 30 mL  30 mL Oral Daily PRN Yerania Chamorro, Madie Reno, MD       pentafluoroprop-tetrafluoroeth (GEBAUERS) aerosol 1 Application  1 Application Topical PRN Catheline Hixon, Madie Reno, MD       pentafluoroprop-tetrafluoroeth (GEBAUERS) aerosol 1 Application  1 Application Topical PRN Dwana Melena, MD       sevelamer carbonate (RENVELA) tablet 1,600 mg  1,600 mg Oral TID WC Adela Esteban T, MD   1,600 mg at 12/20/22 1231   torsemide (DEMADEX) tablet 100 mg  100 mg Oral q morning Pasqualino Witherspoon T, MD   100 mg at 12/20/22 4008   traZODone (DESYREL) tablet 25 mg  25 mg Oral QHS PRN Hobart Marte, Madie Reno, MD   25 mg at 12/19/22 2206    Lab Results: No results found for this or any previous visit (from the past 48 hour(s)).  Blood Alcohol level:  Lab Results  Component Value Date   ETH <10 12/08/2022  ETH <10 62/69/4854    Metabolic Disorder Labs: Lab Results  Component Value Date   HGBA1C 5.4 12/24/2021   MPG 108 12/24/2021   MPG 105.41 01/16/2021   No results found for: "PROLACTIN" Lab Results  Component Value Date   CHOL 109 10/04/2022   TRIG 66 10/04/2022   HDL 54 10/04/2022   CHOLHDL 2.0 10/04/2022   VLDL 13 10/04/2022   LDLCALC 42 10/04/2022   LDLCALC 54 12/24/2021    Physical Findings: AIMS: Facial and Oral Movements Muscles of Facial Expression: None,  normal Lips and Perioral Area: None, normal Jaw: None, normal Tongue: None, normal,Extremity Movements Upper (arms, wrists, hands, fingers): None, normal Lower (legs, knees, ankles, toes): None, normal, Trunk Movements Neck, shoulders, hips: None, normal, Overall Severity Severity of abnormal movements (highest score from questions above): None, normal Incapacitation due to abnormal movements: None, normal Patient's awareness of abnormal movements (rate only patient's report): No Awareness, Dental Status Current problems with teeth and/or dentures?: No Does patient usually wear dentures?: No  CIWA:    COWS:     Musculoskeletal: Strength & Muscle Tone: within normal limits Gait & Station: normal Patient leans: N/A  Psychiatric Specialty Exam:  Presentation  General Appearance:  Casual  Eye Contact: Fair  Speech: Clear and Coherent  Speech Volume: Normal  Handedness: Right   Mood and Affect  Mood: Depressed; Anxious  Affect: Congruent; Depressed   Thought Process  Thought Processes: Coherent  Descriptions of Associations:Intact  Orientation:Full (Time, Place and Person)  Thought Content:Illogical  History of Schizophrenia/Schizoaffective disorder:No  Duration of Psychotic Symptoms:No data recorded Hallucinations:No data recorded Ideas of Reference:None  Suicidal Thoughts:No data recorded Homicidal Thoughts:No data recorded  Sensorium  Memory: Immediate Good; Recent Good; Remote Good  Judgment: -- (impulsive)  Insight: Lacking   Executive Functions  Concentration: Fair  Attention Span: Fair  Recall: [place] of Knowledge: Good  Language: Good   Psychomotor Activity  Psychomotor Activity:No data recorded  Assets  Assets: Communication Skills; Desire for Improvement; Financial Resources/Insurance   Sleep  Sleep:No data recorded   Physical Exam: Physical Exam Vitals and nursing note reviewed.  Constitutional:       Appearance: Normal appearance.  HENT:     Head: Normocephalic and atraumatic.     Mouth/Throat:     Pharynx: Oropharynx is clear.  Eyes:     Pupils: Pupils are equal, round, and reactive to light.  Cardiovascular:     Rate and Rhythm: Normal rate and regular rhythm.  Pulmonary:     Effort: Pulmonary effort is normal.     Breath sounds: Normal breath sounds.  Abdominal:     General: Abdomen is flat.     Palpations: Abdomen is soft.  Musculoskeletal:        General: Normal range of motion.  Skin:    General: Skin is warm and dry.  Neurological:     General: No focal deficit present.     Mental Status: She is alert. Mental status is at baseline.  Psychiatric:        Attention and Perception: Attention normal.        Mood and Affect: Mood normal.        Speech: Speech is delayed.        Behavior: Behavior is slowed.        Thought Content: Thought content normal.    Review of Systems  Constitutional: Negative.   HENT: Negative.    Eyes: Negative.   Respiratory: Negative.  Cardiovascular: Negative.   Gastrointestinal: Negative.   Musculoskeletal: Negative.   Skin: Negative.   Neurological: Negative.   Psychiatric/Behavioral: Negative.     Blood pressure 125/87, pulse (!) 101, temperature (!) 97.5 F (36.4 C), temperature source Oral, resp. rate 18, weight 87.4 kg, SpO2 94 %. Body mass index is 29.3 kg/m.   Treatment Plan Summary: Plan plan still in place for discharge likely tomorrow to an [place].  No change in current treatment plan.  Alethia Berthold, MD 12/20/2022, 12:34 PM

## 2022-12-20 NOTE — Progress Notes (Signed)
Pt denies SI/HI/AVH and verbally agrees to approach staff if these become apparent or before harming themselves/others. Rates depression 0/10. Rates anxiety 0/10. Rates pain 4/10. Pt has been out in the milieu more throughout the day. Pt is walking stably without a walker more often. Pt is much more pleasant and mood is improving. Pt pain is decreasing. Nose is bleeding on and off but seems to be doing better as well. Scheduled medications administered to pt, per MD orders. RN provided support and encouragement to pt. Q15 min safety checks implemented and continued. Pt safe on the unit. RN will continue to monitor and intervene as needed.  12/20/22 0823  Psych Admission Type (Psych Patients Only)  Admission Status Voluntary  Psychosocial Assessment  Patient Complaints None  Eye Contact Fair  Facial Expression Sad  Affect Anxious;Sad  Speech Logical/coherent;Soft  Interaction Minimal;Forwards little  Motor Activity Slow  Appearance/Hygiene In scrubs  Behavior Characteristics Cooperative  Mood Pleasant  Aggressive Behavior  Effect No apparent injury  Thought Process  Coherency WDL  Content WDL  Delusions None reported or observed  Perception WDL  Hallucination None reported or observed  Judgment Limited  Confusion None  Danger to Self  Current suicidal ideation? Denies  Danger to Others  Danger to Others None reported or observed

## 2022-12-20 NOTE — Plan of Care (Signed)
  Problem: Education: Goal: Emotional status will improve Outcome: Progressing   Problem: Activity: Goal: Interest or engagement in activities will improve Outcome: Progressing Goal: Sleeping patterns will improve Outcome: Progressing   Problem: Coping: Goal: Ability to verbalize frustrations and anger appropriately will improve Outcome: Progressing

## 2022-12-21 ENCOUNTER — Observation Stay
Admission: RE | Admit: 2022-12-21 | Discharge: 2022-12-22 | Disposition: A | Discharge status: Home / Self Care | Payer: Medicaid Other | Source: Intra-hospital | Attending: Hospitalist | Admitting: Hospitalist

## 2022-12-21 ENCOUNTER — Encounter: Payer: Self-pay | Admitting: Internal Medicine

## 2022-12-21 ENCOUNTER — Other Ambulatory Visit: Payer: Self-pay

## 2022-12-21 ENCOUNTER — Encounter (HOSPITAL_COMMUNITY): Payer: Self-pay

## 2022-12-21 DIAGNOSIS — F332 Major depressive disorder, recurrent severe without psychotic features: Principal | ICD-10-CM | POA: Insufficient documentation

## 2022-12-21 DIAGNOSIS — J45909 Unspecified asthma, uncomplicated: Secondary | ICD-10-CM | POA: Insufficient documentation

## 2022-12-21 DIAGNOSIS — Z992 Dependence on renal dialysis: Secondary | ICD-10-CM | POA: Insufficient documentation

## 2022-12-21 DIAGNOSIS — D631 Anemia in chronic kidney disease: Secondary | ICD-10-CM | POA: Insufficient documentation

## 2022-12-21 DIAGNOSIS — I132 Hypertensive heart and chronic kidney disease with heart failure and with stage 5 chronic kidney disease, or end stage renal disease: Secondary | ICD-10-CM | POA: Insufficient documentation

## 2022-12-21 DIAGNOSIS — R040 Epistaxis: Secondary | ICD-10-CM

## 2022-12-21 DIAGNOSIS — I5032 Chronic diastolic (congestive) heart failure: Secondary | ICD-10-CM | POA: Insufficient documentation

## 2022-12-21 DIAGNOSIS — N186 End stage renal disease: Secondary | ICD-10-CM | POA: Diagnosis present

## 2022-12-21 DIAGNOSIS — I10 Essential (primary) hypertension: Secondary | ICD-10-CM | POA: Diagnosis present

## 2022-12-21 DIAGNOSIS — D62 Acute posthemorrhagic anemia: Secondary | ICD-10-CM | POA: Diagnosis present

## 2022-12-21 DIAGNOSIS — D649 Anemia, unspecified: Secondary | ICD-10-CM | POA: Diagnosis present

## 2022-12-21 DIAGNOSIS — F17210 Nicotine dependence, cigarettes, uncomplicated: Secondary | ICD-10-CM | POA: Insufficient documentation

## 2022-12-21 DIAGNOSIS — Z79899 Other long term (current) drug therapy: Secondary | ICD-10-CM | POA: Insufficient documentation

## 2022-12-21 LAB — RENAL FUNCTION PANEL
Albumin: 2.4 g/dL — ABNORMAL LOW (ref 3.5–5.0)
Anion gap: 15 (ref 5–15)
BUN: 89 mg/dL — ABNORMAL HIGH (ref 6–20)
CO2: 23 mmol/L (ref 22–32)
Calcium: 9.1 mg/dL (ref 8.9–10.3)
Chloride: 100 mmol/L (ref 98–111)
Creatinine, Ser: 6.97 mg/dL — ABNORMAL HIGH (ref 0.44–1.00)
GFR, Estimated: 8 mL/min — ABNORMAL LOW (ref >60)
Glucose, Bld: 105 mg/dL — ABNORMAL HIGH (ref 70–99)
Phosphorus: 5.7 mg/dL — ABNORMAL HIGH (ref 2.5–4.6)
Potassium: 5 mmol/L (ref 3.5–5.1)
Sodium: 138 mmol/L (ref 135–145)

## 2022-12-21 LAB — PREPARE RBC (CROSSMATCH)

## 2022-12-21 LAB — CBC
HCT: 21.7 % — ABNORMAL LOW (ref 36.0–46.0)
Hemoglobin: 6.6 g/dL — ABNORMAL LOW (ref 12.0–15.0)
MCH: 28.6 pg (ref 26.0–34.0)
MCHC: 30.4 g/dL (ref 30.0–36.0)
MCV: 93.9 fL (ref 80.0–100.0)
Platelets: 208 10*3/uL (ref 150–400)
RBC: 2.31 MIL/uL — ABNORMAL LOW (ref 3.87–5.11)
RDW: 17.4 % — ABNORMAL HIGH (ref 11.5–15.5)
WBC: 8.2 10*3/uL (ref 4.0–10.5)
nRBC: 0 % (ref 0.0–0.2)

## 2022-12-21 MED ORDER — TRAZODONE HCL 50 MG PO TABS: 25.0000 mg | ORAL_TABLET | Freq: Every evening | ORAL | Status: DC | PRN

## 2022-12-21 MED ORDER — GABAPENTIN 100 MG PO CAPS
100.0000 mg | ORAL_CAPSULE | Freq: Two times a day (BID) | ORAL | Status: DC
  Administered 2022-12-21 – 2022-12-22 (×2): 100 mg via ORAL
  Filled 2022-12-21 (×2): qty 1

## 2022-12-21 MED ORDER — ARIPIPRAZOLE ER 400 MG IM SRER: 400.0000 mg | INTRAMUSCULAR | 1 refills | Status: DC

## 2022-12-21 MED ORDER — HYDRALAZINE HCL 50 MG PO TABS
50.0000 mg | ORAL_TABLET | Freq: Three times a day (TID) | ORAL | Status: DC
  Administered 2022-12-21 – 2022-12-22 (×3): 50 mg via ORAL
  Filled 2022-12-21 (×3): qty 1

## 2022-12-21 MED ORDER — SEVELAMER CARBONATE 800 MG PO TABS
1600.0000 mg | ORAL_TABLET | Freq: Three times a day (TID) | ORAL | Status: DC
  Administered 2022-12-22 (×2): 1600 mg via ORAL
  Filled 2022-12-21 (×2): qty 2

## 2022-12-21 MED ORDER — TORSEMIDE 20 MG PO TABS
100.0000 mg | ORAL_TABLET | Freq: Every morning | ORAL | Status: DC
  Administered 2022-12-22: 100 mg via ORAL
  Filled 2022-12-21: qty 5

## 2022-12-21 MED ORDER — CALCITRIOL 0.25 MCG PO CAPS
0.2500 ug | ORAL_CAPSULE | ORAL | Status: DC
  Administered 2022-12-22: 0.25 ug via ORAL
  Filled 2022-12-21: qty 1

## 2022-12-21 MED ORDER — CALCIUM ACETATE (PHOS BINDER) 667 MG PO CAPS
667.0000 mg | ORAL_CAPSULE | Freq: Three times a day (TID) | ORAL | Status: DC
  Administered 2022-12-22 (×3): 667 mg via ORAL
  Filled 2022-12-21 (×3): qty 1

## 2022-12-21 MED ORDER — SODIUM CHLORIDE 0.9% IV SOLUTION: Freq: Once | INTRAVENOUS | Status: AC

## 2022-12-21 MED ORDER — ISOSORBIDE MONONITRATE ER 30 MG PO TB24
60.0000 mg | ORAL_TABLET | Freq: Every day | ORAL | Status: DC
  Administered 2022-12-22: 60 mg via ORAL
  Filled 2022-12-21: qty 2

## 2022-12-21 MED ORDER — CARVEDILOL 6.25 MG PO TABS
6.2500 mg | ORAL_TABLET | Freq: Two times a day (BID) | ORAL | Status: DC
  Administered 2022-12-22 (×2): 6.25 mg via ORAL
  Filled 2022-12-21 (×2): qty 1

## 2022-12-21 MED ORDER — LIDOCAINE-PRILOCAINE 2.5-2.5 % EX CREA: 1.0000 | TOPICAL_CREAM | CUTANEOUS | Status: DC | PRN

## 2022-12-21 MED ORDER — ONDANSETRON HCL 4 MG/2ML IJ SOLN: 4.0000 mg | Freq: Four times a day (QID) | INTRAMUSCULAR | Status: DC | PRN

## 2022-12-21 MED ORDER — ARIPIPRAZOLE 15 MG PO TABS
7.5000 mg | ORAL_TABLET | Freq: Every day | ORAL | Status: DC
  Administered 2022-12-22: 7.5 mg via ORAL
  Filled 2022-12-21: qty 1

## 2022-12-21 MED ORDER — DULOXETINE HCL 20 MG PO CPEP
20.0000 mg | ORAL_CAPSULE | Freq: Every day | ORAL | Status: DC
  Administered 2022-12-22: 20 mg via ORAL
  Filled 2022-12-21: qty 1

## 2022-12-21 MED ORDER — ONDANSETRON HCL 4 MG PO TABS: 4.0000 mg | ORAL_TABLET | Freq: Four times a day (QID) | ORAL | Status: DC | PRN

## 2022-12-21 MED ORDER — EPOETIN ALFA 10000 UNIT/ML IJ SOLN
INTRAMUSCULAR | Status: AC
  Filled 2022-12-21: qty 1

## 2022-12-21 NOTE — Progress Notes (Signed)
1430: hemoglobin is 6.6. NP Colon Flattery and floor RN were notified. HD NP also notified the patient's primary team. According to NP [facility], patient will be discharged tomorrow instead. Patient was notified by this RN. Will continue to monitor.

## 2022-12-21 NOTE — Assessment & Plan Note (Signed)
Stable and not acutely exacerbated Last known LVEF of 55 to 60% from a 2D echocardiogram which was done 12/23 Continue torsemide, carvedilol, nitrates and hydralazine

## 2022-12-21 NOTE — Progress Notes (Signed)
1650: Patient with remaining 30 mins on HD treatment and she wants to leave early. Patient signed AMA, NP Colon Flattery NP notified.

## 2022-12-21 NOTE — Discharge Summary (Signed)
Physician Discharge Summary Note  Patient:  Natasha Long is an 27 y.o., female MRN:  [id number] DOB:  1995/12/13 Patient phone:  [phone] (home)  Patient address:   [place] [address],  Total Time spent with patient: 30 minutes  Date of Admission:  12/14/2022 Date of Discharge: 12/21/2022  Reason for Admission: Patient was admitted in transfer from [facility] because of ongoing depression with suicidal thoughts in the context of homelessness and multiple severe medical problems  Principal Problem: Severe recurrent major depression without psychotic features [facility]) Discharge Diagnoses: Principal Problem:   Severe recurrent major depression without psychotic features ([place]) Active Problems:   PTSD (post-traumatic stress disorder)   HTN (hypertension)   Borderline personality disorder ([place])   Moderate cannabis use disorder ([place])   ESRD (end stage renal disease) ([place])   Acute on chronic diastolic CHF (congestive heart failure) ([place])   Chronic back pain   Homelessness   Past Psychiatric History: Long history of chronic mental illness with mood problems behavior problems and polysubstance abuse.  Multiple admissions.  Multiple prior suicide attempts.  Past Medical History:  Past Medical History:  Diagnosis Date   Asthma    as a child, no problem as an adult, no inhaler   CHF (congestive heart failure) (HCC)    Complication of anesthesia    woke up before tube removed, 1 time fought nurses   ESRD on hemodialysis [facility])    M-W-F   History of borderline personality disorder    Hypertension    diagnosed as child; stopped meds at 39 yo   Insomnia    Neuromuscular disorder ([place])    peripheral neuropathy   Nonspecific chest pain    PTSD (post-traumatic stress disorder)     Past Surgical History:  Procedure Laterality Date   AV FISTULA PLACEMENT Left 10/18/2020   Procedure: LEFT ARM ARTERIOVENOUS (AV) FISTULA CREATION;  Surgeon: Serafina Mitchell, MD;   [facility]: [facility];  Service: Vascular;  Laterality: Left;   CHOLECYSTECTOMY     extraction of wisdom teeth     FISTULA SUPERFICIALIZATION Left 02/13/2021   Procedure: LEFT BRACHIOCEPHALIC ARTERIOVENOUS FISTULA SUPERFICIALIZATION;  Surgeon: Serafina Mitchell, MD;  Location: [place];  Service: Vascular;  Laterality: Left;   I & D EXTREMITY Left 01/30/2022   Procedure: IRRIGATION AND DEBRIDEMENT EXTREMITY;  Surgeon: Milly Jakob, MD;  Location: [place];  Service: Orthopedics;  Laterality: Left;   INCISION AND DRAINAGE OF WOUND Left 01/30/2022   Procedure: LEFT WRIST ASPIRATION;  Surgeon: Vanetta Mulders, MD;  Location: [place];  Service: Orthopedics;  Laterality: Left;   KNEE ARTHROSCOPY Right 01/30/2022   Procedure: ARTHROSCOPY KNEE AND IRRIIGATION AND DEBRIDMENT; LEFT WRIST ASPIRATION;  Surgeon: Vanetta Mulders, MD;  [facility];  Service: Orthopedics;  Laterality: Right;   RENAL BIOPSY     x 2   TUNNELED VENOUS CATHETER PLACEMENT  02/11/2021   [facility]   Family History:  Family History  Adopted: Yes  Problem Relation Age of Onset   Diabetes Other    Hypertension Other    Family Psychiatric  History: See previous Social History:  Social History   Substance and Sexual Activity  Alcohol Use Not Currently   Alcohol/week: 1.0 standard drink of alcohol   Types: 1 Shots of liquor per week     Social History   Substance and Sexual Activity  Drug Use Yes   Types: Marijuana, Cocaine, Oxycodone   Comment: daily cocaine use; cannabis use 1x weekly; Rx  opiates 1x monthly    Social History   Socioeconomic History   Marital status: Soil scientist    Spouse name: Not on file   Number of children: 1   Years of education: Not on file   Highest education level: 12th grade  Occupational History   Occupation: unemployed  Tobacco Use   Smoking status: Every Day    Packs/day: 0.50    Years: 10.00    Total pack years: 5.00    Types: Cigarettes   Smokeless tobacco: Never  Vaping Use    Vaping Use: Some days  Substance and Sexual Activity   Alcohol use: Not Currently    Alcohol/week: 1.0 standard drink of alcohol    Types: 1 Shots of liquor per week   Drug use: Yes    Types: Marijuana, Cocaine, Oxycodone    Comment: daily cocaine use; cannabis use 1x weekly; Rx opiates 1x monthly   Sexual activity: Yes    Partners: Male    Birth control/protection: None    Comment: reports exchanging sex for drugs  Other Topics Concern   Not on file  Social History Narrative   Not on file   Social Determinants of Health   Financial Resource Strain: Unknown (01/13/2019)   Overall Financial Resource Strain (CARDIA)    Difficulty of Paying Living Expenses: Patient refused  Food Insecurity: Food Insecurity Present (12/14/2022)   Hunger Vital Sign    Worried About Running Out of Food in the Last Year: Often true    Ran Out of Food in the Last Year: Often true  Transportation Needs: Unmet Transportation Needs (12/14/2022)   PRAPARE - Transportation    Lack of Transportation (Medical): Yes    Lack of Transportation (Non-Medical): Yes  Physical Activity: Unknown (01/13/2019)   Exercise Vital Sign    Days of Exercise per Week: Patient refused    Minutes of Exercise per Session: Patient refused  Stress: Unknown (01/13/2019)   [employer] [place] of Stress : Patient refused  Social Connections: Unknown (01/13/2019)   Social Connection and Isolation Panel [NHANES]    Frequency of Communication with Friends and Family: Patient refused    Frequency of Social Gatherings with Friends and Family: Patient refused    Attends Religious Services: Patient refused    Marine scientist or Organizations: Patient refused    Attends Archivist Meetings: Patient refused    Marital Status: Patient refused    Hospital Course: Admitted to psychiatric unit.  15-minute checks continued.  Patient initially was having  suicidal thoughts but did not act on them and did not display any dangerous behavior in the hospital.  We paid close attention to trying to get her medical problems stabilizing.  Patient was seen by nephrology and continued her dialysis.  At one point she developed recurrent nosebleeds and I got some advice from ENT though seem to have subsided.  She had to go to the medical service briefly because of her hemoglobin dropping so low she was getting dizzy and required a transfusion.  We have found a place for [facility] to stay at [facility] [place].  She is very enthusiastic agreeable.  We are making arrangements to make sure she has dialysis follow-up locally.  Prescriptions for medications and a supply are being given.  Lots of counseling to the patient.  For the last several days she has denied any suicidal thoughts and her affect has  been upbeat with appropriate thinking.  Physical Findings: AIMS: Facial and Oral Movements Muscles of Facial Expression: None, normal Lips and Perioral Area: None, normal Jaw: None, normal Tongue: None, normal,Extremity Movements Upper (arms, wrists, hands, fingers): None, normal Lower (legs, knees, ankles, toes): None, normal, Trunk Movements Neck, shoulders, hips: None, normal, Overall Severity Severity of abnormal movements (highest score from questions above): None, normal Incapacitation due to abnormal movements: None, normal Patient's awareness of abnormal movements (rate only patient's report): No Awareness, Dental Status Current problems with teeth and/or dentures?: No Does patient usually wear dentures?: No  CIWA:    COWS:     Musculoskeletal: Strength & Muscle Tone: within normal limits Gait & Station: normal Patient leans: N/A   Psychiatric Specialty Exam:  Presentation  General Appearance:  Casual  Eye Contact: Fair  Speech: Clear and Coherent  Speech Volume: Normal  Handedness: Right   Mood and Affect  Mood: Depressed;  Anxious  Affect: Congruent; Depressed   Thought Process  Thought Processes: Coherent  Descriptions of Associations:Intact  Orientation:Full (Time, Place and Person)  Thought Content:Illogical  History of Schizophrenia/Schizoaffective disorder:No  Duration of Psychotic Symptoms:No data recorded Hallucinations:No data recorded Ideas of Reference:None  Suicidal Thoughts:No data recorded Homicidal Thoughts:No data recorded  Sensorium  Memory: Immediate Good; Recent Good; Remote Good  Judgment: -- (impulsive)  Insight: Lacking   Executive Functions  Concentration: Fair  Attention Span: Fair  Recall: [place] of Knowledge: Good  Language: Good   Psychomotor Activity  Psychomotor Activity:No data recorded  Assets  Assets: Communication Skills; Desire for Improvement; Financial Resources/Insurance   Sleep  Sleep:No data recorded   Physical Exam: Physical Exam Vitals and nursing note reviewed.  Constitutional:      Appearance: Normal appearance.  HENT:     Head: Normocephalic and atraumatic.     Mouth/Throat:     Pharynx: Oropharynx is clear.  Eyes:     Pupils: Pupils are equal, round, and reactive to light.  Cardiovascular:     Rate and Rhythm: Normal rate and regular rhythm.  Pulmonary:     Effort: Pulmonary effort is normal.     Breath sounds: Normal breath sounds.  Abdominal:     General: Abdomen is flat.     Palpations: Abdomen is soft.  Musculoskeletal:        General: Normal range of motion.  Skin:    General: Skin is warm and dry.  Neurological:     General: No focal deficit present.     Mental Status: She is alert. Mental status is at baseline.  Psychiatric:        Attention and Perception: Attention normal.        Mood and Affect: Mood normal.        Speech: Speech normal.        Behavior: Behavior normal.        Thought Content: Thought content normal.        Cognition and Memory: Cognition normal.    Review of  Systems  Constitutional: Negative.   HENT: Negative.    Eyes: Negative.   Respiratory: Negative.    Cardiovascular: Negative.   Gastrointestinal: Negative.   Musculoskeletal: Negative.   Skin: Negative.   Neurological: Negative.   Psychiatric/Behavioral: Negative.     Blood pressure 129/83, pulse 97, temperature 98.1 F (36.7 C), temperature source Oral, resp. rate (!) 23, weight 87.4 kg, SpO2 97 %. Body mass index is 29.3 kg/m.   Social History   Tobacco Use  Smoking Status Every Day   Packs/day: 0.50   Years: 10.00   Total pack years: 5.00   Types: Cigarettes  Smokeless Tobacco Never   Tobacco Cessation:  A prescription for an FDA-approved tobacco cessation medication was offered at discharge and the patient refused   Blood Alcohol level:  Lab Results  Component Value Date   [facility] <10 12/08/2022   ETH <10 96/29/5284    Metabolic Disorder Labs:  Lab Results  Component Value Date   HGBA1C 5.4 12/24/2021   MPG 108 12/24/2021   MPG 105.41 01/16/2021   No results found for: "PROLACTIN" Lab Results  Component Value Date   CHOL 109 10/04/2022   TRIG 66 10/04/2022   HDL 54 10/04/2022   CHOLHDL 2.0 10/04/2022   VLDL 13 10/04/2022   LDLCALC 42 10/04/2022   LDLCALC 54 12/24/2021    See Psychiatric Specialty Exam and Suicide Risk Assessment completed by Attending Physician prior to discharge.  Discharge destination:  Home  Is patient on multiple antipsychotic therapies at discharge:  No   Has Patient had three or more failed trials of antipsychotic monotherapy by history:  No  Recommended Plan for Multiple Antipsychotic Therapies: NA  Discharge Instructions     Diet - low sodium heart healthy   Complete by: As directed    Increase activity slowly   Complete by: As directed    No wound care   Complete by: As directed       Allergies as of 12/21/2022       Reactions   Bupropion Anxiety, Other (See Comments), Nausea And Vomiting   Caused panic  attacks Caused panic attacks    "Adverse effects"   Prozac [fluoxetine Hcl] Anxiety, Other (See Comments)   Caused panic attacks. Dizziness    Prednisone Other (See Comments)   Pt stated this med caused pancreatitis. Dizziness        Medication List     STOP taking these medications    acetaminophen 500 MG tablet Commonly known as: TYLENOL   doxycycline 100 MG tablet Commonly known as: VIBRA-TABS       TAKE these medications      Indication  alteplase 2 MG injection Commonly known as: CATHFLO ACTIVASE 2 mg by Intracatheter route once as needed for open catheter.  Indication: Clot of Peritoneal Dialysis Catheter   anticoagulant sodium citrate 4 GM/100ML Soln 5 mLs by Intracatheter route as needed (in dialysis, If TTP or HIT.).  Indication: dialysis   ARIPiprazole 5 MG tablet Commonly known as: ABILIFY Take 1.5 tablets (7.5 mg total) by mouth daily. What changed: Another medication with the same name was added. Make sure you understand how and when to take each.  Indication: Major Depressive Disorder   ARIPiprazole ER 400 MG Srer injection Commonly known as: ABILIFY MAINTENA Inject 2 mLs (400 mg total) into the muscle every 28 (twenty-eight) days. Start taking on: January 16, 2023 What changed: You were already taking a medication with the same name, and this prescription was added. Make sure you understand how and when to take each.  Indication: Manic-Depression   calcitRIOL 0.25 MCG capsule Commonly known as: ROCALTROL Take 1 capsule (0.25 mcg total) by mouth Every Tuesday,Thursday,and Saturday with dialysis.  Indication: Low Amount of Calcium in the Blood   calcium acetate 667 MG capsule Commonly known as: PHOSLO Take 1 capsule (667 mg total) by mouth 3 (three) times daily with meals.  Indication: High Amount of Phosphate in the Blood   carvedilol 6.25  MG tablet Commonly known as: COREG Take 1 tablet (6.25 mg total) by mouth 2 (two) times daily with a  meal.  Indication: Cardiac Failure, High Blood Pressure Disorder   Chlorhexidine Gluconate Cloth 2 % Pads Apply 6 each topically daily at 6 (six) AM.  Indication: cleaning   Darbepoetin Alfa 200 MCG/0.4ML Sosy injection Commonly known as: ARANESP Inject 0.4 mLs (200 mcg total) into the skin every Wednesday at 6 PM.  Indication: Anemia associated with Chronic Kidney Failure   diclofenac Sodium 1 % Gel Commonly known as: VOLTAREN Apply 2 g topically 4 (four) times daily.  Indication: Joint Damage causing Pain and Loss of Function   DULoxetine 20 MG capsule Commonly known as: CYMBALTA Take 1 capsule (20 mg total) by mouth daily.  Indication: Diabetes with Nerve Disease   gabapentin 100 MG capsule Commonly known as: NEURONTIN Take 1 capsule (100 mg total) by mouth 2 (two) times daily.  Indication: Neuropathic Pain   gentamicin ointment 0.1 % Commonly known as: GARAMYCIN Apply topically 3 (three) times daily.  Indication: Epistaxis   heparin 1000 unit/mL Soln injection 1 mL (1,000 Units total) by Intracatheter route as needed (in dialysis).  Indication: Blood or Blood Product Transfusion   hydrALAZINE 50 MG tablet Commonly known as: APRESOLINE Take 1 tablet (50 mg total) by mouth every 8 (eight) hours.  Indication: High Blood Pressure Disorder   isosorbide mononitrate 60 MG 24 hr tablet Commonly known as: IMDUR Take 1 tablet (60 mg total) by mouth daily.  Indication: Stable Angina Pectoris   lidocaine-prilocaine cream Commonly known as: EMLA Apply 1 Application topically as needed (topical anesthesia for hemodialysis if Gebauers and Lidocaine injection are ineffective.).  Indication: Anesthesia to a Specific Part of the Body   pentafluoroprop-tetrafluoroeth Aero Commonly known as: GEBAUERS Apply 1 Application topically as needed (topical anesthesia for hemodialysis).  Indication: pain   phenylephrine 0.25 % nasal spray Commonly known as: NEO-SYNEPHRINE Place 2  sprays into both nostrils every 4 (four) hours as needed (nosebleeding)  Indication: Epistaxis   sevelamer carbonate 800 MG tablet Commonly known as: RENVELA Take 2 tablets (1,600 mg total) by mouth 3 (three) times daily with meals.  Indication: High Amount of Phosphate in the Blood   torsemide 100 MG tablet Commonly known as: DEMADEX Take 1 tablet (100 mg total) by mouth every morning.  Indication: Cardiac Failure, Kidney Disease   traZODone 50 MG tablet Commonly known as: DESYREL Take 0.5 tablets (25 mg total) by mouth at bedtime as needed for sleep.  Indication: Goodnight Follow up.   Specialty: Behavioral Health Why: Walk in hours are at 8:30AM Monday through Friday.  It is first come first serve. Contact information: [place] [phone]                Follow-up recommendations:  Other:  Follow-up with local mental health providers through [facility].  Prescriptions of medicine and a 30-day supply provided.  Encourage patient to be open about her substance abuse problems and to engage in substance abuse treatment as well.  Comments: See above  Signed: Alethia Berthold, MD 12/21/2022, 2:24 PM

## 2022-12-21 NOTE — Group Note (Signed)
[facility] LCSW Group Therapy Note    Group Date: 12/21/2022 Start Time: 1300 End Time: 1400  Type of Therapy and Topic:  Group Therapy:  Overcoming Obstacles  Participation Level:  [facility] PARTICIPATION LEVEL: Did Not Attend  Mood:  Description of Group:   In this group patients will be encouraged to explore what they see as obstacles to their own wellness and recovery. They will be guided to discuss their thoughts, feelings, and behaviors related to these obstacles. The group will process together ways to cope with barriers, with attention given to specific choices patients can make. Each patient will be challenged to identify changes they are motivated to make in order to overcome their obstacles. This group will be process-oriented, with patients participating in exploration of their own experiences as well as giving and receiving support and challenge from other group members.  Therapeutic Goals: 1. Patient will identify personal and current obstacles as they relate to admission. 2. Patient will identify barriers that currently interfere with their wellness or overcoming obstacles.  3. Patient will identify feelings, thought process and behaviors related to these barriers. 4. Patient will identify two changes they are willing to make to overcome these obstacles:    Summary of Patient Progress Patient at dialysis.    Therapeutic Modalities:   Cognitive Behavioral Therapy Solution Focused Therapy Motivational Interviewing Relapse Prevention Therapy   Rozann Lesches, LCSW

## 2022-12-21 NOTE — Assessment & Plan Note (Signed)
Dialysis days are M/W/F Patient to continue renal replacement therapy as an outpatient upon discharge

## 2022-12-21 NOTE — Progress Notes (Signed)
Patient alert and oriented x 4, no distress noted, she was noted asleep for the earlier hours of the shift. Patient later woke up and interacted appropriately with peers and staff. Patient was complaint with medication regimen, her affect is flat, thoughts are organized and coherent and she denies SI/HI/AVH. 15 minutes safety checks maintained.

## 2022-12-21 NOTE — Assessment & Plan Note (Signed)
Treatment as outlined in 1

## 2022-12-21 NOTE — Progress Notes (Signed)
[facility]  ROUNDING NOTE   Subjective:   Patient seen resting in bed Continues to have left leg pain Ambulation has improved.   Objective:  Vital signs in last 24 hours:  Temp:  [98.1 F (36.7 C)-98.2 F (36.8 C)] 98.1 F (36.7 C) (01/22 1320) Pulse Rate:  [95-105] 97 (01/22 1400) Resp:  [18-24] 23 (01/22 1400) BP: (125-137)/(79-89) 129/83 (01/22 1400) SpO2:  [97 %-100 %] 97 % (01/22 1400)  Weight change:  Filed Weights   12/18/22 1237 12/19/22 0726 12/19/22 1124  Weight: 88.8 kg 90.1 kg 87.4 kg    Intake/Output: No intake/output data recorded.   Intake/Output this shift:  No intake/output data recorded.  Physical Exam: General: NAD  Head: Normocephalic, atraumatic. Moist oral mucosal membranes  Eyes: Anicteric  Lungs:  Clear to auscultation, normal effort, room air  Heart: Regular rate and rhythm  Abdomen:  Soft, nontender  Extremities: 1+ peripheral edema.  Neurologic: Alert and oriented, moving all four extremities  Skin: No lesions  Access: Left aVF    Basic Metabolic Panel: Recent Labs  Lab 12/16/22 0931 12/18/22 0844 12/21/22 1332  NA 135 137 138  K 4.6 5.1 5.0  CL 97* 101 100  CO2 26 24 23   GLUCOSE 121* 82 105*  BUN 81* 88* 89*  CREATININE 5.82* 6.86* 6.97*  CALCIUM 8.9 9.3 9.1  PHOS 5.0* 6.3* 5.7*     Liver Function Tests: Recent Labs  Lab 12/16/22 0931 12/18/22 0844 12/21/22 1332  ALBUMIN 2.4* 2.4* 2.4*    No results for input(s): "LIPASE", "AMYLASE" in the last 168 hours. No results for input(s): "AMMONIA" in the last 168 hours.  CBC: Recent Labs  Lab 12/16/22 0931 12/18/22 0844 12/21/22 1332  WBC 8.3 8.6 8.2  HGB 7.4* 7.1* 6.6*  HCT 24.5* 23.6* 21.7*  MCV 92.8 94.4 93.9  PLT 171 176 208     Cardiac Enzymes: No results for input(s): "CKTOTAL", "CKMB", "CKMBINDEX", "TROPONINI" in the last 168 hours.  BNP: Invalid input(s): "POCBNP"  CBG: No results for input(s): "GLUCAP" in the last 168  hours.   Microbiology: Results for orders placed or performed during the hospital encounter of 12/09/22  MRSA Next Gen by PCR, Nasal     Status: None   Collection Time: 12/11/22 10:34 AM   Specimen: Nasal Mucosa; Nasal Swab  Result Value Ref Range Status   MRSA by PCR Next Gen NOT DETECTED NOT DETECTED Final    Comment: (NOTE) The GeneXpert MRSA Assay (FDA approved for NASAL specimens only), is one component of a comprehensive MRSA colonization surveillance program. It is not intended to diagnose MRSA infection nor to guide or monitor treatment for MRSA infections. Test performance is not FDA approved in patients less than 55 years old. Performed at [facility], [address] [place], [place] [address]     Coagulation Studies: No results for input(s): "LABPROT", "INR" in the last 72 hours.  Urinalysis: No results for input(s): "COLORURINE", "LABSPEC", "PHURINE", "GLUCOSEU", "HGBUR", "BILIRUBINUR", "KETONESUR", "PROTEINUR", "UROBILINOGEN", "NITRITE", "LEUKOCYTESUR" in the last 72 hours.  Invalid input(s): "APPERANCEUR"    Imaging: No results found.   Medications:    anticoagulant sodium citrate     anticoagulant sodium citrate      ARIPiprazole  7.5 mg Oral Daily   ARIPiprazole ER  400 mg Intramuscular Q28 days   calcitRIOL  0.25 mcg Oral Q T,Th,Sa-HD   calcium acetate  667 mg Oral TID WC   carvedilol  6.25 mg Oral BID WC  cefTRIAXone (ROCEPHIN) IM  250 mg Intramuscular Once   Chlorhexidine Gluconate Cloth  6 each Topical Q0600   diclofenac Sodium  2 g Topical QID   DULoxetine  20 mg Oral Daily   epoetin (EPOGEN/PROCRIT) injection  10,000 Units Intravenous Q M,W,F-HD   gentamicin ointment   Topical TID   heparin  5,000 Units Dialysis Once in dialysis   hydrALAZINE  50 mg Oral Q8H   isosorbide mononitrate  60 mg Oral Daily   sevelamer carbonate  1,600 mg Oral TID WC   torsemide  100 mg Oral q morning   acetaminophen, alteplase, alteplase, alum & mag  hydroxide-simeth, anticoagulant sodium citrate, anticoagulant sodium citrate, heparin, heparin, lidocaine (PF), lidocaine (PF), lidocaine-prilocaine, lidocaine-prilocaine, magnesium hydroxide, pentafluoroprop-tetrafluoroeth, pentafluoroprop-tetrafluoroeth, traZODone  Assessment/ Plan:  [name] is a 27 y.o.  female with past medical conditions including CHF, hypertension, PTSD, borderline personality disorder, and end-stage renal disease on hemodialysis, who was admitted to [facility] on 12/09/2022 for  Severe recurrent major depression without psychotic features ([place]) [F33.2]  [facility]/MWF/left aVF  End-stage renal disease on hemodialysis.   Scheduled to receive dialysis today, UF 3L as tolerated. Next treatment scheduled for Wednesday. Renal navigator seeking outpatient clinic.    2. Anemia of chronic kidney disease Lab Results  Component Value Date   HGB 6.6 (L) 12/21/2022    Hgb below desired target. Primary team notified. Continue EPO with routine dialysis.  3. Secondary Hyperparathyroidism: with outpatient labs: PTH 886, phosphorus 8.0, calcium 9.6 on 10/30/2022.              Continue calcium acetate with meals.   4.  Hypertension with chronic kidney disease.  Patient currently prescribed carvedilol, hydralazine, isosorbide, and torsemide.  Currently receiving these medications.  Blood pressure stable for this patient. Blood pressure stable  5.  Hyperkalemia likely due to poorly monitored diet.  Potassium 5.0. Will correct dialysis today.    LOS: 7   1/22/20242:24 PM

## 2022-12-21 NOTE — Progress Notes (Addendum)
ADDENDUM Patient currently discharging from dialysis to medical floor due to hemoglobin concerns.    Patient to be discharged from medical floor to [facility]  Assunta Curtis, MSW, LCSW 12/21/2022 3:43 PM   [facility] Adult Case Management Discharge Plan :  Will you be returning to the same living situation after discharge:  No.  Patient reports that she is going to an [employer]. At discharge, do you have transportation home?: Yes,  CSW to assist with transportation needs.  Do you have the ability to pay for your medications: Yes,  [place] [employer] / [place] of information consent forms completed and in the chart;  Patient's signature needed at discharge.  Patient to Follow up at:  Follow-up Information     [facility] Follow up.   Specialty: Behavioral Health Why: Walk in hours are at 8:30AM Monday through Friday.  It is first come first serve. Contact information: [place] [phone]                Next level of care provider has access to [place] and Suicide Prevention discussed: Yes,  SPE completed with the patient.      Has patient been referred to the Quitline?: Patient refused referral  Patient has been referred for addiction treatment: Pt. refused referral  Rozann Lesches, LCSW 12/21/2022, 9:56 AM

## 2022-12-21 NOTE — Progress Notes (Signed)
Recreation Therapy Notes   Date: 12/21/2022  Time: 10:30 am     Location: Craft room   Behavioral response: Appropriate  Intervention Topic: Time-Management   Discussion/Intervention:  Group content today was focused on time management. The group defined time management and identified healthy ways to manage time. Individuals expressed how much of the 24 hours they use in a day. Patients expressed how much time they use just for themselves personally. The group expressed how they have managed their time in the past. Individuals participated in the intervention "Managing Life" where they had a chance to see how much of the 24 hours they use and where it goes. Clinical Observations/Feedback: Patient came to group late due to unknown reasons.Individual was social with staff and peers while participating in the intervention.  Kiron Osmun LRT/CTRS         Mackenzy Grumbine 12/21/2022 1:41 PM

## 2022-12-21 NOTE — Assessment & Plan Note (Addendum)
Secondary to epistaxis Noted to have a drop in her H&H over several days (9.1 >> 6.6) We will transfuse 1 unit of packed RBC Patient will need close monitoring to prevent development of fluid overload

## 2022-12-21 NOTE — BH IP Treatment Plan (Signed)
Interdisciplinary Treatment and Diagnostic Plan Update  12/21/2022 Time of Session: 8:30AM Natasha Long MRN: [id number]  Principal Diagnosis: Severe recurrent major depression without psychotic features [facility])  Secondary Diagnoses: Principal Problem:   Severe recurrent major depression without psychotic features ([place]) Active Problems:   PTSD (post-traumatic stress disorder)   HTN (hypertension)   Borderline personality disorder ([place])   Moderate cannabis use disorder ([place])   ESRD (end stage renal disease) ([place])   Acute on chronic diastolic CHF (congestive heart failure) (HCC)   Chronic back pain   Homelessness   Current Medications:  Current Facility-Administered Medications  Medication Dose Route Frequency Provider Last Rate Last Admin   acetaminophen (TYLENOL) tablet 650 mg  650 mg Oral Q6H PRN Clapacs, John T, MD   650 mg at 12/21/22 0829   alteplase (CATHFLO ACTIVASE) injection 2 mg  2 mg Intracatheter Once PRN Clapacs, Madie Reno, MD       alteplase (CATHFLO ACTIVASE) injection 2 mg  2 mg Intracatheter Once PRN Dwana Melena, MD       alum & mag hydroxide-simeth (MAALOX/MYLANTA) 200-200-20 MG/5ML suspension 30 mL  30 mL Oral Q4H PRN Clapacs, John T, MD       anticoagulant sodium citrate solution 5 mL  5 mL Intracatheter PRN Clapacs, John T, MD       anticoagulant sodium citrate solution 5 mL  5 mL Intracatheter PRN Dwana Melena, MD       ARIPiprazole (ABILIFY) tablet 7.5 mg  7.5 mg Oral Daily Clapacs, John T, MD   7.5 mg at 12/21/22 0829   ARIPiprazole ER (ABILIFY MAINTENA) injection 400 mg  400 mg Intramuscular Q28 days Clapacs, John T, MD   400 mg at 12/19/22 1712   calcitRIOL (ROCALTROL) capsule 0.25 mcg  0.25 mcg Oral Q T,Th,Sa-HD Clapacs, John T, MD   0.25 mcg at 12/19/22 1318   calcium acetate (PHOSLO) capsule 667 mg  667 mg Oral TID WC Clapacs, John T, MD   667 mg at 12/21/22 1113   carvedilol (COREG) tablet 6.25 mg  6.25 mg Oral BID WC Clapacs, John T, MD   6.25 mg at  12/20/22 1704   cefTRIAXone (ROCEPHIN) injection 250 mg  250 mg Intramuscular Once Clapacs, Madie Reno, MD       Chlorhexidine Gluconate Cloth 2 % PADS 6 each  6 each Topical Q0600 Colon Flattery, NP   6 each at 12/20/22 0630   diclofenac Sodium (VOLTAREN) 1 % topical gel 2 g  2 g Topical QID Clapacs, John T, MD   2 g at 12/20/22 0829   DULoxetine (CYMBALTA) DR capsule 20 mg  20 mg Oral Daily Clapacs, Madie Reno, MD   20 mg at 12/21/22 8756   epoetin alfa (EPOGEN) injection 10,000 Units  10,000 Units Intravenous Q M,W,F-HD Colon Flattery, NP   10,000 Units at 12/18/22 1142   gentamicin ointment (GARAMYCIN) 0.1 %   Topical TID Clapacs, Madie Reno, MD   Given at 12/21/22 1116   heparin injection 1,000 Units  1,000 Units Intracatheter PRN Colon Flattery, NP       heparin injection 1,000 Units  1,000 Units Intracatheter PRN Dwana Melena, MD       heparin injection 5,000 Units  5,000 Units Dialysis Once in dialysis Dwana Melena, MD       hydrALAZINE (APRESOLINE) tablet 50 mg  50 mg Oral Q8H Clapacs, Madie Reno, MD   50 mg at 12/21/22 0045   isosorbide mononitrate (IMDUR) 24 hr  tablet 60 mg  60 mg Oral Daily Clapacs, Madie Reno, MD   60 mg at 12/21/22 0829   lidocaine (PF) (XYLOCAINE) 1 % injection 5 mL  5 mL Intradermal PRN Colon Flattery, NP       lidocaine (PF) (XYLOCAINE) 1 % injection 5 mL  5 mL Intradermal PRN Dwana Melena, MD       lidocaine-prilocaine (EMLA) cream 1 Application  1 Application Topical PRN Clapacs, Madie Reno, MD       lidocaine-prilocaine (EMLA) cream 1 Application  1 Application Topical PRN Dwana Melena, MD       magnesium hydroxide (MILK OF MAGNESIA) suspension 30 mL  30 mL Oral Daily PRN Clapacs, Madie Reno, MD   30 mL at 12/21/22 0829   pentafluoroprop-tetrafluoroeth (GEBAUERS) aerosol 1 Application  1 Application Topical PRN Clapacs, Madie Reno, MD       pentafluoroprop-tetrafluoroeth (GEBAUERS) aerosol 1 Application  1 Application Topical PRN Dwana Melena, MD       sevelamer carbonate  (RENVELA) tablet 1,600 mg  1,600 mg Oral TID WC Clapacs, John T, MD   1,600 mg at 12/21/22 1113   torsemide (DEMADEX) tablet 100 mg  100 mg Oral q morning Clapacs, Madie Reno, MD   100 mg at 12/21/22 1113   traZODone (DESYREL) tablet 25 mg  25 mg Oral QHS PRN Clapacs, Madie Reno, MD   25 mg at 12/20/22 2153   PTA Medications: Medications Prior to Admission  Medication Sig Dispense Refill Last Dose   acetaminophen (TYLENOL) 500 MG tablet Take 2 tablets (1,000 mg total) by mouth every 8 (eight) hours for 3 days, THEN 2 tablets (1,000 mg total) every 8 (eight) hours as needed for up to 5 days. 30 tablet 0    alteplase (CATHFLO ACTIVASE) 2 MG injection 2 mg by Intracatheter route once as needed for open catheter. 1 each     anticoagulant sodium citrate 4 GM/100ML SOLN 5 mLs by Intracatheter route as needed (in dialysis, If TTP or HIT.).      calcium acetate (PHOSLO) 667 MG capsule Take 1 capsule (667 mg total) by mouth 3 (three) times daily with meals. 90 capsule 3    Chlorhexidine Gluconate Cloth 2 % PADS Apply 6 each topically daily at 6 (six) AM.      Darbepoetin Alfa (ARANESP) 200 MCG/0.4ML SOSY injection Inject 0.4 mLs (200 mcg total) into the skin every Wednesday at 6 PM. 1.68 mL     doxycycline (VIBRA-TABS) 100 MG tablet Take 1 tablet (100 mg total) by mouth every 12 (twelve) hours for 7 days. 14 tablet 0    DULoxetine (CYMBALTA) 20 MG capsule Take 1 capsule (20 mg total) by mouth daily.  3    gabapentin (NEURONTIN) 100 MG capsule Take 1 capsule (100 mg total) by mouth 2 (two) times daily.      heparin 1000 unit/mL SOLN injection 1 mL (1,000 Units total) by Intracatheter route as needed (in dialysis).      isosorbide mononitrate (IMDUR) 60 MG 24 hr tablet Take 1 tablet (60 mg total) by mouth daily. 30 tablet 0    lidocaine-prilocaine (EMLA) cream Apply 1 Application topically as needed (topical anesthesia for hemodialysis if Gebauers and Lidocaine injection are ineffective.). 30 g 0     pentafluoroprop-tetrafluoroeth (GEBAUERS) AERO Apply 1 Application topically as needed (topical anesthesia for hemodialysis).  0    [DISCONTINUED] ARIPiprazole (ABILIFY) 5 MG tablet Take 1.5 tablets (7.5 mg total) by mouth daily. 45 tablet 1    [  DISCONTINUED] calcitRIOL (ROCALTROL) 0.25 MCG capsule Take 1 capsule (0.25 mcg total) by mouth Every Tuesday,Thursday,and Saturday with dialysis. 30 capsule 0    [DISCONTINUED] carvedilol (COREG) 6.25 MG tablet Take 1 tablet (6.25 mg total) by mouth 2 (two) times daily with a meal. 60 tablet 0    [DISCONTINUED] hydrALAZINE (APRESOLINE) 50 MG tablet Take 1 tablet (50 mg total) by mouth every 8 (eight) hours. 90 tablet 0    [DISCONTINUED] sevelamer carbonate (RENVELA) 800 MG tablet Take 2 tablets (1,600 mg total) by mouth 3 (three) times daily with meals. 180 tablet 0    [DISCONTINUED] torsemide (DEMADEX) 100 MG tablet Take 1 tablet (100 mg total) by mouth every morning. 30 tablet 3     Patient Stressors: Educational concerns   Financial difficulties   Health problems   Legal issue   Loss of family support   Marital or family conflict   Medication change or noncompliance   Occupational concerns   Substance abuse   Traumatic event    Patient Strengths: Ability for insight  Average or above average intelligence  Communication skills   Treatment Modalities: Medication Management, Group therapy, Case management,  1 to 1 session with clinician, Psychoeducation, Recreational therapy.   Physician Treatment Plan for Primary Diagnosis: Severe recurrent major depression without psychotic features ([place]) Long Term Goal(s): Improvement in symptoms so as ready for discharge   Short Term Goals: Ability to maintain clinical measurements within normal limits will improve Compliance with prescribed medications will improve Ability to verbalize feelings will improve Ability to disclose and discuss suicidal ideas Ability to demonstrate self-control will  improve  Medication Management: Evaluate patient's response, side effects, and tolerance of medication regimen.  Therapeutic Interventions: 1 to 1 sessions, Unit Group sessions and Medication administration.  Evaluation of Outcomes: Progressing  Physician Treatment Plan for Secondary Diagnosis: Principal Problem:   Severe recurrent major depression without psychotic features ([place]) Active Problems:   PTSD (post-traumatic stress disorder)   HTN (hypertension)   Borderline personality disorder (HCC)   Moderate cannabis use disorder (HCC)   ESRD (end stage renal disease) (HCC)   Acute on chronic diastolic CHF (congestive heart failure) (HCC)   Chronic back pain   Homelessness  Long Term Goal(s): Improvement in symptoms so as ready for discharge   Short Term Goals: Ability to maintain clinical measurements within normal limits will improve Compliance with prescribed medications will improve Ability to verbalize feelings will improve Ability to disclose and discuss suicidal ideas Ability to demonstrate self-control will improve     Medication Management: Evaluate patient's response, side effects, and tolerance of medication regimen.  Therapeutic Interventions: 1 to 1 sessions, Unit Group sessions and Medication administration.  Evaluation of Outcomes: Progressing   RN Treatment Plan for Primary Diagnosis: Severe recurrent major depression without psychotic features ([place]) Long Term Goal(s): Knowledge of disease and therapeutic regimen to maintain health will improve  Short Term Goals: Ability to demonstrate self-control, Ability to participate in decision making will improve, Ability to verbalize feelings will improve, Ability to disclose and discuss suicidal ideas, Ability to identify and develop effective coping behaviors will improve, and Compliance with prescribed medications will improve  Medication Management: RN will administer medications as ordered by provider, will assess  and evaluate patient's response and provide education to patient for prescribed medication. RN will report any adverse and/or side effects to prescribing provider.  Therapeutic Interventions: 1 on 1 counseling sessions, Psychoeducation, Medication administration, Evaluate responses to treatment, Monitor vital signs and CBGs as ordered,  Perform/monitor CIWA, COWS, AIMS and Fall Risk screenings as ordered, Perform wound care treatments as ordered.  Evaluation of Outcomes: Progressing   LCSW Treatment Plan for Primary Diagnosis: Severe recurrent major depression without psychotic features ([place]) Long Term Goal(s): Safe transition to appropriate next level of care at discharge, Engage patient in therapeutic group addressing interpersonal concerns.  Short Term Goals: Engage patient in aftercare planning with referrals and resources, Increase social support, Increase ability to appropriately verbalize feelings, Increase emotional regulation, Facilitate acceptance of mental health diagnosis and concerns, Facilitate patient progression through stages of change regarding substance use diagnoses and concerns, Identify triggers associated with mental health/substance abuse issues, and Increase skills for wellness and recovery  Therapeutic Interventions: Assess for all discharge needs, 1 to 1 time with Social worker, Explore available resources and support systems, Assess for adequacy in community support network, Educate family and significant other(s) on suicide prevention, Complete Psychosocial Assessment, Interpersonal group therapy.  Evaluation of Outcomes: Progressing   Progress in Treatment: Attending groups: Yes. Participating in groups: Yes. Taking medication as prescribed: Yes. Toleration medication: Yes. Family/Significant other contact made: Yes, individual(s) contacted:  SPE completed with the patient.  Patient understands diagnosis: Yes. Discussing patient identified problems/goals with  staff: Yes. Medical problems stabilized or resolved: Yes. Denies suicidal/homicidal ideation: Yes. Issues/concerns per patient self-inventory: No. Other: none  New problem(s) identified: No, Describe:  none  Update 12/21/2022: No changes at this time.    New Short Term/Long Term Goal(s):Patient to work towards detox, medication management for mood stabilization; elimination of SI thoughts; development of comprehensive mental wellness/sobriety plan.  Update 12/21/2022: No changes at this time.    Patient Goals:  Patient states their goal for treatment is to "housing, pain, and drugs."Update 12/21/2022: No changes at this time.    Discharge Plan or Barriers: No psychosocial barriers identified at this time, patient to return to place of residence when appropriate for discharge. Update 12/21/2022: Patient reports that she has obtained a bed at an [facility].  Dialysis Coordinator is arranging for her to begin her services at a local facility.      Reason for Continuation of Hospitalization: Depression Medication stabilization Suicidal ideation   Estimated Length of Stay:  Update 12/21/2022: TBD  Last 3 [place] Suicide Severity Risk Score: Flowsheet Row Admission (Current) from 12/14/2022 in [place] Admission (Discharged) from 12/11/2022 in [place] Admission (Discharged) from 12/09/2022 in [name] Moderate Risk Moderate Risk Low Risk       Last PHQ 2/9 Scores:    08/11/2021    3:14 PM 12/30/2020   12:27 PM 12/27/2020    1:33 PM  Depression screen PHQ 2/9  Decreased Interest 3 3 3   Down, Depressed, Hopeless 3 3 3   PHQ - 2 Score 6 6 6   Altered sleeping 2 3 3   Tired, decreased energy 3 3 3   Change in appetite 1 2 3   Feeling bad or failure about yourself  3 3 3   Trouble concentrating 3 3 3   Moving slowly or fidgety/restless 0 0 1  Suicidal thoughts 3 2 2   PHQ-9 Score  21 22 24     Scribe for Treatment Team: Gates Rigg 12/21/2022 12:39 PM

## 2022-12-21 NOTE — Assessment & Plan Note (Addendum)
Recent discharge from behavioral health unit for stabilization of major depression with suicidal ideation in the setting of homelessness Patient has improved and has been cleared from psychiatry May be discharged in a.m. to [place] if stable Continue trazodone, Cymbalta and aripiprazole

## 2022-12-21 NOTE — Progress Notes (Signed)
Patient requested to stop dialysis. She said she's having an headache but refused pain medication.

## 2022-12-21 NOTE — Plan of Care (Signed)
  Problem: Education: Goal: Emotional status will improve Outcome: Progressing Goal: Mental status will improve Outcome: Progressing   Problem: Activity: Goal: Interest or engagement in activities will improve Outcome: Progressing Goal: Sleeping patterns will improve Outcome: Progressing
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
## 2022-12-21 NOTE — H&P (Signed)
History and Physical    Patient: Natasha Long CBJ:[id number] DOB: 10-Oct-1996 DOA: 12/21/2022 DOS: the patient was seen and examined on 12/21/2022 PCP: [name], MD  Patient coming from: Home  Chief Complaint: No chief complaint on file.  HPI: Natasha Long is a 27 y.o. female with medical history significant for hypertension, end-stage renal disease on hemodialysis (M/W/F), history of borderline personality disorder, history of posttraumatic stress disorder, chronic diastolic dysfunction CHF, moderate cannabis use disorder, chronic low back pain, recent hospitalization to the behavioral health unit for depression with suicidal ideation who is being admitted for symptomatic anemia. Patient was transfused 2 units of packed RBC on 12/12/22 for symptomatic anemia with improvement in her H&H and was discharged to the behavioral health unit for management of her ongoing severe depression with suicidal ideations. She complains of epistaxis and states that she has had several episodes while in the behavioral health unit. She denies having any hematemesis, no hematochezia, no passage of melena stools and denies NSAID use.  She denies excessive bleeding with her periods and tells me she has been transfused in the past.  During her previous admission Hemoccult was negative She denies having any chest pain, no shortness of breath, no nausea, no vomiting, no headache, no fever, no chills, no cough, no blurred vision no focal deficit. She was seen and examined in the dialysis unit and left her session 30 minutes early. Abnormal labs today was hemoglobin of 6.6 She will be referred to observation status for blood transfusion  Review of Systems: As mentioned in the history of present illness. All other systems reviewed and are negative. Past Medical History:  Diagnosis Date   Asthma    as a child, no problem as an adult, no inhaler   CHF (congestive heart failure) (HCC)    Complication of  anesthesia    woke up before tube removed, 1 time fought nurses   ESRD on hemodialysis [facility])    M-W-F   History of borderline personality disorder    Hypertension    diagnosed as child; stopped meds at 31 yo   Insomnia    Neuromuscular disorder ([place])    peripheral neuropathy   Nonspecific chest pain    PTSD (post-traumatic stress disorder)    Past Surgical History:  Procedure Laterality Date   AV FISTULA PLACEMENT Left 10/18/2020   Procedure: LEFT ARM ARTERIOVENOUS (AV) FISTULA CREATION;  Surgeon: Serafina Mitchell, MD;  Location: [facility];  Service: Vascular;  Laterality: Left;   CHOLECYSTECTOMY     extraction of wisdom teeth     FISTULA SUPERFICIALIZATION Left 02/13/2021   Procedure: LEFT BRACHIOCEPHALIC ARTERIOVENOUS FISTULA SUPERFICIALIZATION;  Surgeon: Serafina Mitchell, MD;  Location: [place];  Service: Vascular;  Laterality: Left;   I & D EXTREMITY Left 01/30/2022   Procedure: IRRIGATION AND DEBRIDEMENT EXTREMITY;  Surgeon: Milly Jakob, MD;  Location: [place];  Service: Orthopedics;  Laterality: Left;   INCISION AND DRAINAGE OF WOUND Left 01/30/2022   Procedure: LEFT WRIST ASPIRATION;  Surgeon: Vanetta Mulders, MD;  Location: [place];  Service: Orthopedics;  Laterality: Left;   KNEE ARTHROSCOPY Right 01/30/2022   Procedure: ARTHROSCOPY KNEE AND IRRIIGATION AND DEBRIDMENT; LEFT WRIST ASPIRATION;  Surgeon: Vanetta Mulders, MD;  Location: [place];  Service: Orthopedics;  Laterality: Right;   RENAL BIOPSY     x 2   TUNNELED VENOUS CATHETER PLACEMENT  02/11/2021   [facility]   Social History:  reports that she has been smoking cigarettes. She has  a 5.00 pack-year smoking history. She has never used smokeless tobacco. She reports that she does not currently use alcohol after a past usage of about 1.0 standard drink of alcohol per week. She reports current drug use. Drugs: Marijuana, Cocaine, and Oxycodone.  Allergies  Allergen Reactions   Bupropion Anxiety, Other (See Comments) and  Nausea And Vomiting    Caused panic attacks  Caused panic attacks    "Adverse effects"   Prozac [Fluoxetine Hcl] Anxiety and Other (See Comments)    Caused panic attacks. Dizziness    Prednisone Other (See Comments)    Pt stated this med caused pancreatitis. Dizziness    Family History  Adopted: Yes  Problem Relation Age of Onset   Diabetes Other    Hypertension Other     Prior to Admission medications   Medication Sig Start Date End Date Taking? Authorizing Provider  ARIPiprazole (ABILIFY) 5 MG tablet Take 1.5 tablets (7.5 mg total) by mouth daily. 12/18/22  Yes Clapacs, Madie Reno, MD  calcitRIOL (ROCALTROL) 0.25 MCG capsule Take 1 capsule (0.25 mcg total) by mouth Every Tuesday,Thursday,and Saturday with dialysis. 12/19/22  Yes Clapacs, Madie Reno, MD  calcium acetate (PHOSLO) 667 MG capsule Take 1 capsule (667 mg total) by mouth 3 (three) times daily with meals. 12/18/22  Yes Clapacs, Madie Reno, MD  carvedilol (COREG) 6.25 MG tablet Take 1 tablet (6.25 mg total) by mouth 2 (two) times daily with a meal. 12/18/22 02/16/23 Yes Clapacs, Madie Reno, MD  diclofenac Sodium (VOLTAREN) 1 % GEL Apply 2 g topically 4 (four) times daily. 12/18/22  Yes Clapacs, Madie Reno, MD  DULoxetine (CYMBALTA) 20 MG capsule Take 1 capsule (20 mg total) by mouth daily. 12/19/22  Yes Clapacs, Madie Reno, MD  gabapentin (NEURONTIN) 100 MG capsule Take 1 capsule (100 mg total) by mouth 2 (two) times daily. 12/12/22  Yes Clapacs, Madie Reno, MD  hydrALAZINE (APRESOLINE) 50 MG tablet Take 1 tablet (50 mg total) by mouth every 8 (eight) hours. 12/18/22 02/16/23 Yes Clapacs, Madie Reno, MD  isosorbide mononitrate (IMDUR) 60 MG 24 hr tablet Take 1 tablet (60 mg total) by mouth daily. 12/19/22  Yes Clapacs, Madie Reno, MD  lidocaine-prilocaine (EMLA) cream Apply 1 Application topically as needed (topical anesthesia for hemodialysis if Gebauers and Lidocaine injection are ineffective.). 12/11/22  Yes Clapacs, Madie Reno, MD  sevelamer carbonate (RENVELA) 800 MG  tablet Take 2 tablets (1,600 mg total) by mouth 3 (three) times daily with meals. 12/18/22  Yes Clapacs, Madie Reno, MD  torsemide (DEMADEX) 100 MG tablet Take 1 tablet (100 mg total) by mouth every morning. 12/18/22  Yes Clapacs, Madie Reno, MD  traZODone (DESYREL) 50 MG tablet Take 0.5 tablets (25 mg total) by mouth at bedtime as needed for sleep. 12/18/22  Yes Clapacs, Madie Reno, MD  alteplase (CATHFLO ACTIVASE) 2 MG injection 2 mg by Intracatheter route once as needed for open catheter. 12/11/22   Clapacs, Madie Reno, MD  anticoagulant sodium citrate 4 GM/100ML SOLN 5 mLs by Intracatheter route as needed (in dialysis, If TTP or HIT.). 12/11/22   Clapacs, Madie Reno, MD  ARIPiprazole ER (ABILIFY MAINTENA) 400 MG SRER injection Inject 2 mLs (400 mg total) into the muscle every 28 (twenty-eight) days. 01/16/23   Clapacs, Madie Reno, MD  Chlorhexidine Gluconate Cloth 2 % PADS Apply 6 each topically daily at 6 (six) AM. 12/11/22   Clapacs, Madie Reno, MD  Darbepoetin Alfa (ARANESP) 200 MCG/0.4ML SOSY injection Inject 0.4 mLs (200 mcg total) into  the skin every Wednesday at 6 PM. 12/16/22   Clapacs, Madie Reno, MD  gentamicin ointment (GARAMYCIN) 0.1 % Apply topically 3 (three) times daily. 12/18/22   Clapacs, Madie Reno, MD  heparin 1000 unit/mL SOLN injection 1 mL (1,000 Units total) by Intracatheter route as needed (in dialysis). 12/11/22   Clapacs, Madie Reno, MD  pentafluoroprop-tetrafluoroeth Landry Dyke) AERO Apply 1 Application topically as needed (topical anesthesia for hemodialysis). 12/11/22   Clapacs, Madie Reno, MD  phenylephrine (NEO-SYNEPHRINE) 0.25 % nasal spray Place 2 sprays into both nostrils every 4 (four) hours as needed (nosebleeding) 12/18/22   Clapacs, Madie Reno, MD    Physical Exam: Vitals:   12/21/22 1824  BP: 122/78  Pulse: (!) 102  Resp: 16  Temp: 99.1 F (37.3 C)  TempSrc: Oral  SpO2: 100%  Weight: 88.2 kg  Height: 5\' 8"  (1.727 m)   Physical Exam Vitals and nursing note reviewed.  Constitutional:      Comments:  Chronically ill-appearing.  Looks older than stated age.  HENT:     Head: Normocephalic and atraumatic.     Nose: Nose normal.     Mouth/Throat:     Mouth: Mucous membranes are moist.  Eyes:     Comments: Pale conjunctiva  Cardiovascular:     Rate and Rhythm: Normal rate and regular rhythm.  Pulmonary:     Effort: Pulmonary effort is normal.     Breath sounds: Normal breath sounds.  Abdominal:     General: Abdomen is flat. Bowel sounds are normal.     Palpations: Abdomen is soft.     Comments: Tender right upper quadrant  Musculoskeletal:     Cervical back: Normal range of motion and neck supple.     Right lower leg: Edema present.     Left lower leg: Edema present.  Skin:    General: Skin is warm and dry.  Neurological:     General: No focal deficit present.     Mental Status: She is alert and oriented to person, place, and time.  Psychiatric:     Comments: Flat affect     Data Reviewed: Relevant notes from primary care and specialist visits, past discharge summaries as available in EHR, including Care Everywhere. Prior diagnostic testing as pertinent to current admission diagnoses Updated medications and problem lists for reconciliation ED course, including vitals, labs, imaging, treatment and response to treatment Triage notes, nursing and pharmacy notes and ED provider's notes Notable results as noted in HPI Labs reviewed.  Hemoglobin 6.6, hematocrit 21.7, platelet count 208, sodium 138, potassium 5.0, chloride 100, bicarb 23, glucose 105, BUN 89, creatinine 6.97, calcium 9.1, phosphorus 5.7, albumin 2.4 There are no new results to review at this time.  Assessment and Plan: * ABLA (acute blood loss anemia) Secondary to epistaxis Noted to have a drop in her H&H over several days (9.1 >> 6.6) We will transfuse 1 unit of packed RBC Patient will need close monitoring to prevent development of fluid overload  Symptomatic anemia Treatment as outlined in 1  HTN  (hypertension) Continue carvedilol, hydralazine and nitrates  ESRD (end stage renal disease) ([place]) Dialysis days are M/W/F Patient to continue renal replacement therapy as an outpatient upon discharge  Chronic diastolic CHF (congestive heart failure) ([place]) Stable and not acutely exacerbated Last known LVEF of 55 to 60% from a 2D echocardiogram which was done 12/23 Continue torsemide, carvedilol, nitrates and hydralazine  Severe recurrent major depression without psychotic features ([place]) Recent discharge from behavioral health unit  for stabilization of major depression with suicidal ideation in the setting of homelessness Patient has improved and has been cleared from psychiatry May be discharged in a.m. to [place] if stable Continue trazodone, Cymbalta and aripiprazole      Advance Care Planning:   Code Status: Full Code   Consults: Nephrology  Family Communication: Greater than 50% of time was spent discussing patient's condition and plan of care with her at the bedside.  All questions and concerns have been addressed.  She verbalizes understanding and agrees with the plan.  Severity of Illness: The appropriate patient status for this patient is OBSERVATION. Observation status is judged to be reasonable and necessary in order to provide the required intensity of service to ensure the patient's safety. The patient's presenting symptoms, physical exam findings, and initial radiographic and laboratory data in the context of their medical condition is felt to place them at decreased risk for further clinical deterioration. Furthermore, it is anticipated that the patient will be medically stable for discharge from the hospital within 2 midnights of admission.   Author: Collier Bullock, MD 12/21/2022 6:50 PM  For on call review [email or web address].

## 2022-12-21 NOTE — Assessment & Plan Note (Signed)
Continue carvedilol, hydralazine and nitrates

## 2022-12-21 NOTE — Progress Notes (Signed)
Discharge note: Pt will be going to medical floor for blood transfusion. SSP and survey complete. RN met with pt and reviewed pt's discharge instructions. Pt verbalized understanding of discharge instructions and pt did not have any questions. RN reviewed and provided pt with a copy of SRA, AVS and Transition Record. RN returned pt's belongings to pt. Prescriptions and samples were given to pt. Pt denied SI/HI/AVH and voiced no concerns. Pt was appreciative of the care pt received at [facility]. Patient discharged to the medical floor without incident.   12/21/22 0829  Psych Admission Type (Psych Patients Only)  Admission Status Voluntary  Psychosocial Assessment  Patient Complaints Anxiety  Eye Contact Fair  Facial Expression Worried;Anxious  Affect Anxious;Sad  Speech Logical/coherent;Soft  Interaction Assertive  Motor Activity Slow  Appearance/Hygiene In scrubs  Behavior Characteristics Cooperative;Appropriate to situation;Anxious  Mood Anxious;Pleasant  Aggressive Behavior  Effect No apparent injury  Thought Process  Coherency WDL  Content WDL  Delusions None reported or observed  Perception WDL  Hallucination None reported or observed  Judgment Impaired  Confusion None  Danger to Self  Current suicidal ideation? Denies  Danger to Others  Danger to Others None reported or observed

## 2022-12-22 DIAGNOSIS — F332 Major depressive disorder, recurrent severe without psychotic features: Secondary | ICD-10-CM | POA: Diagnosis not present

## 2022-12-22 LAB — TYPE AND SCREEN
ABO/RH(D): A NEG
Antibody Screen: NEGATIVE
Unit division: 0

## 2022-12-22 LAB — BPAM RBC
Blood Product Expiration Date: 202402142359
ISSUE DATE / TIME: 202401222233
Unit Type and Rh: 600

## 2022-12-22 LAB — CBC
HCT: 25.8 % — ABNORMAL LOW (ref 36.0–46.0)
Hemoglobin: 7.9 g/dL — ABNORMAL LOW (ref 12.0–15.0)
MCH: 28.2 pg (ref 26.0–34.0)
MCHC: 30.6 g/dL (ref 30.0–36.0)
MCV: 92.1 fL (ref 80.0–100.0)
Platelets: 230 10*3/uL (ref 150–400)
RBC: 2.8 MIL/uL — ABNORMAL LOW (ref 3.87–5.11)
RDW: 17 % — ABNORMAL HIGH (ref 11.5–15.5)
WBC: 8.1 10*3/uL (ref 4.0–10.5)
nRBC: 0 % (ref 0.0–0.2)

## 2022-12-22 LAB — BASIC METABOLIC PANEL
Anion gap: 11 (ref 5–15)
BUN: 62 mg/dL — ABNORMAL HIGH (ref 6–20)
CO2: 26 mmol/L (ref 22–32)
Calcium: 9.4 mg/dL (ref 8.9–10.3)
Chloride: 99 mmol/L (ref 98–111)
Creatinine, Ser: 5.38 mg/dL — ABNORMAL HIGH (ref 0.44–1.00)
GFR, Estimated: 10 mL/min — ABNORMAL LOW (ref >60)
Glucose, Bld: 87 mg/dL (ref 70–99)
Potassium: 4.9 mmol/L (ref 3.5–5.1)
Sodium: 136 mmol/L (ref 135–145)

## 2022-12-22 MED ORDER — ALTEPLASE 2 MG IJ SOLR: 2.0000 mg | Freq: Once | INTRAMUSCULAR | Status: DC | PRN

## 2022-12-22 MED ORDER — DULOXETINE HCL 20 MG PO CPEP: 20.0000 mg | ORAL_CAPSULE | Freq: Every day | ORAL | 2 refills | Status: AC

## 2022-12-22 MED ORDER — SEVELAMER CARBONATE 800 MG PO TABS: 1600.0000 mg | ORAL_TABLET | Freq: Three times a day (TID) | ORAL | 2 refills | Status: AC

## 2022-12-22 MED ORDER — CHLORHEXIDINE GLUCONATE CLOTH 2 % EX PADS: 6.0000 | MEDICATED_PAD | Freq: Every day | CUTANEOUS | Status: DC

## 2022-12-22 MED ORDER — TORSEMIDE 100 MG PO TABS: 100.0000 mg | ORAL_TABLET | Freq: Every morning | ORAL | 2 refills | Status: AC

## 2022-12-22 MED ORDER — HEPARIN SODIUM (PORCINE) 1000 UNIT/ML DIALYSIS: 1000.0000 [IU] | INTRAMUSCULAR | Status: DC | PRN

## 2022-12-22 MED ORDER — HYDRALAZINE HCL 50 MG PO TABS: 50.0000 mg | ORAL_TABLET | Freq: Three times a day (TID) | ORAL | 2 refills | Status: AC

## 2022-12-22 MED ORDER — LIDOCAINE HCL (PF) 1 % IJ SOLN: 5.0000 mL | INTRAMUSCULAR | Status: DC | PRN

## 2022-12-22 MED ORDER — CARVEDILOL 6.25 MG PO TABS: 6.2500 mg | ORAL_TABLET | Freq: Two times a day (BID) | ORAL | 2 refills | Status: AC

## 2022-12-22 MED ORDER — CALCIUM ACETATE (PHOS BINDER) 667 MG PO CAPS: 667.0000 mg | ORAL_CAPSULE | Freq: Three times a day (TID) | ORAL | 2 refills | Status: AC

## 2022-12-22 MED ORDER — ARIPIPRAZOLE ER 400 MG IM SRER: 400.0000 mg | INTRAMUSCULAR | 2 refills | Status: AC

## 2022-12-22 MED ORDER — ISOSORBIDE MONONITRATE ER 60 MG PO TB24: 60.0000 mg | ORAL_TABLET | Freq: Every day | ORAL | 2 refills | Status: AC

## 2022-12-22 MED ORDER — PENTAFLUOROPROP-TETRAFLUOROETH EX AERO
1.0000 | INHALATION_SPRAY | CUTANEOUS | Status: DC | PRN
  Filled 2022-12-22: qty 30

## 2022-12-22 MED ORDER — CALCITRIOL 0.25 MCG PO CAPS: 0.2500 ug | ORAL_CAPSULE | ORAL | 2 refills | Status: AC

## 2022-12-22 MED ORDER — TRAZODONE HCL 50 MG PO TABS: 25.0000 mg | ORAL_TABLET | Freq: Every evening | ORAL | 0 refills | Status: AC | PRN

## 2022-12-22 MED ORDER — ARIPIPRAZOLE 5 MG PO TABS: 7.5000 mg | ORAL_TABLET | Freq: Every day | ORAL | 2 refills | Status: AC

## 2022-12-22 MED ORDER — ANTICOAGULANT SODIUM CITRATE 4% (200MG/5ML) IV SOLN: 5.0000 mL | Status: DC | PRN

## 2022-12-22 NOTE — Progress Notes (Signed)
[facility]  ROUNDING NOTE   Subjective:   Patient seen at side of bed Fully dressed, smiling States she feels well today Adamant she would like to be discharged today, despite awaiting for outpatient dialysis clinic acceptance.   Objective:  Vital signs in last 24 hours:  Temp:  [98.1 F (36.7 C)-99.2 F (37.3 C)] 98.3 F (36.8 C) (01/23 0834) Pulse Rate:  [95-106] 99 (01/23 0834) Resp:  [15-26] 16 (01/23 0834) BP: (116-137)/(76-92) 120/82 (01/23 0834) SpO2:  [97 %-100 %] 100 % (01/23 0834) Weight:  [87.8 kg-90.5 kg] 88.2 kg (01/22 1824)  Weight change:  Filed Weights   12/21/22 1824  Weight: 88.2 kg    Intake/Output: I/O last 3 completed shifts: In: 666 [P.O.:200; Blood:466] Out: -    Intake/Output this shift:  No intake/output data recorded.  Physical Exam: General: NAD  Head: Normocephalic, atraumatic. Moist oral mucosal membranes  Eyes: Anicteric  Lungs:  Clear to auscultation, normal effort, room air  Heart: Regular rate and rhythm  Abdomen:  Soft, nontender  Extremities: 1+ peripheral edema.  Neurologic: Alert and oriented, moving all four extremities  Skin: No lesions  Access: Left aVF    Basic Metabolic Panel: Recent Labs  Lab 12/16/22 0931 12/18/22 0844 12/21/22 1332 12/22/22 0537  NA 135 137 138 136  K 4.6 5.1 5.0 4.9  CL 97* 101 100 99  CO2 26 24 23 26   GLUCOSE 121* 82 105* 87  BUN 81* 88* 89* 62*  CREATININE 5.82* 6.86* 6.97* 5.38*  CALCIUM 8.9 9.3 9.1 9.4  PHOS 5.0* 6.3* 5.7*  --      Liver Function Tests: Recent Labs  Lab 12/16/22 0931 12/18/22 0844 12/21/22 1332  ALBUMIN 2.4* 2.4* 2.4*    No results for input(s): "LIPASE", "AMYLASE" in the last 168 hours. No results for input(s): "AMMONIA" in the last 168 hours.  CBC: Recent Labs  Lab 12/16/22 0931 12/18/22 0844 12/21/22 1332 12/22/22 0537  WBC 8.3 8.6 8.2 8.1  HGB 7.4* 7.1* 6.6* 7.9*  HCT 24.5* 23.6* 21.7* 25.8*  MCV 92.8 94.4 93.9 92.1  PLT 171  176 208 230     Cardiac Enzymes: No results for input(s): "CKTOTAL", "CKMB", "CKMBINDEX", "TROPONINI" in the last 168 hours.  BNP: Invalid input(s): "POCBNP"  CBG: No results for input(s): "GLUCAP" in the last 168 hours.   Microbiology: Results for orders placed or performed during the hospital encounter of 12/09/22  MRSA Next Gen by PCR, Nasal     Status: None   Collection Time: 12/11/22 10:34 AM   Specimen: Nasal Mucosa; Nasal Swab  Result Value Ref Range Status   MRSA by PCR Next Gen NOT DETECTED NOT DETECTED Final    Comment: (NOTE) The GeneXpert MRSA Assay (FDA approved for NASAL specimens only), is one component of a comprehensive MRSA colonization surveillance program. It is not intended to diagnose MRSA infection nor to guide or monitor treatment for MRSA infections. Test performance is not FDA approved in patients less than 29 years old. Performed at [facility], [address] [place], [place] [address]     Coagulation Studies: No results for input(s): "LABPROT", "INR" in the last 72 hours.  Urinalysis: No results for input(s): "COLORURINE", "LABSPEC", "PHURINE", "GLUCOSEU", "HGBUR", "BILIRUBINUR", "KETONESUR", "PROTEINUR", "UROBILINOGEN", "NITRITE", "LEUKOCYTESUR" in the last 72 hours.  Invalid input(s): "APPERANCEUR"    Imaging: No results found.   Medications:      ARIPiprazole  7.5 mg Oral Daily   calcitRIOL  0.25 mcg Oral  Q T,Th,Sa-HD   calcium acetate  667 mg Oral TID WC   carvedilol  6.25 mg Oral BID WC   DULoxetine  20 mg Oral Daily   gabapentin  100 mg Oral BID   hydrALAZINE  50 mg Oral Q8H   isosorbide mononitrate  60 mg Oral Daily   sevelamer carbonate  1,600 mg Oral TID WC   torsemide  100 mg Oral q morning   lidocaine-prilocaine, ondansetron **OR** ondansetron (ZOFRAN) IV, traZODone  Assessment/ Plan:  [name] is a 27 y.o.  female with past medical conditions including CHF, hypertension, PTSD, borderline  personality disorder, and end-stage renal disease on hemodialysis, who was admitted to [facility] on 12/09/2022 for  Symptomatic anemia [D64.9]  [facility] [place]/MWF/left aVF  End-stage renal disease on hemodialysis.   Received dialysis yesterday, treatment terminated early due to headache. Per HD RN note, patient refused pain medications. Next treatment scheduled for Thursday.   Renal navigator currently seeking outpatient dialysis clinic.    2. Anemia of chronic kidney disease Lab Results  Component Value Date   HGB 7.9 (L) 12/22/2022    Hgb improved from yesterday with 1 unit blood transfusion. Continue EPO with routine dialysis.  3. Secondary Hyperparathyroidism: with outpatient labs: PTH 886, phosphorus 8.0, calcium 9.6 on 10/30/2022.              Continue calcium acetate with meals.   4.  Hypertension with chronic kidney disease.  Patient currently prescribed carvedilol, hydralazine, isosorbide, and torsemide.  Currently receiving these medications.  Blood pressure 120/82  5.  Hyperkalemia likely due to poorly monitored diet.  Potassium 4.9   LOS: 0   1/23/202410:01 AM

## 2022-12-22 NOTE — Progress Notes (Signed)
       Against Medical Advice  NAME: Natasha Long MRN: [id number] DOB : 10/17/1996 ATTENDING PHYSICIAN: Darlin Priestly, MD                                                                       Patient at this time expresses desire to leave the Hospital immediately, patient has been warned that this is not Medically advisable at this time, and can result in Medical complications like Death and Disability. Specifically discussed importance of arranging outpatient dialysis or presenting to the nearest ED on her normal dialysis day. Patient has full decision making capacity and understands and accepts the risks involved and assumes full responsibilty of this decision.  This patient has also been advised that if they feel the need for further medical assistance to return to the closest ER or dial [phone].   To reach the provider On-Call:   7AM- 7PM see care teams to locate the attending and reach out to them via [email or web address] Password: [email or web address] 7PM-7AM contact night-coverage If you still have difficulty reaching the appropriate provider, please page the [facility] (Director on Call) for [facility] on amion for assistance  This document was prepared using Conservation officer, historic buildings and may include unintentional dictation errors.  Bishop Limbo DNP, MBA, FNP-BC Nurse Practitioner [facility] Pager [phone]

## 2022-12-22 NOTE — Progress Notes (Signed)
PROGRESS NOTE    Natasha Long  PJK:[id number] DOB: November 20, 1996 DOA: 12/21/2022 PCP: [name], MD  [id number]  LOS: 0 days   Brief hospital course:   Assessment & Plan: Natasha Long is a 27 y.o. female with medical history significant for hypertension, end-stage renal disease on hemodialysis (M/W/F), history of borderline personality disorder, history of posttraumatic stress disorder, chronic diastolic dysfunction CHF, moderate cannabis use disorder, chronic low back pain, recent hospitalization to the behavioral health unit for depression with suicidal ideation who was admitted for anemia needing blood transfusion.  Patient was transfused 2 units of packed RBC on 12/12/22 for symptomatic anemia with improvement in her H&H and was discharged to the behavioral health unit for management of her ongoing severe depression with suicidal ideations.  She reported several episodes of epistaxis while in the behavioral health unit.   Anemia of chronic kidney disease Exacerbated by blood loss  --anemia workup showed no def in iron, Vit B12 or folate.  Pt has baseline anemia, and blood loss from epistaxis likely contributed to the Hgb drop --s/p 1u pRBC for Hgb 6.6, with appropriate increase to 7.9 Plan: --monitor Hgb and transfuse to keep Hgb >7 --Epo with dialysis  Recurrent epistaxis --pt reported it's always from the right nostril. --recommend pt see outpatient ENT at her new home location, for consideration of cauterization of bleeding vessel.  * Severe recurrent major depression without psychotic features ([place]) --discharged on 12/21/22 from behavioral health unit for stabilization of major depression with suicidal ideation in the setting of homelessness Patient has improved and has been cleared from psychiatry Continue trazodone, Cymbalta and aripiprazole (both oral daily and IM injection q28d)  HTN (hypertension) Continue carvedilol, hydralazine, Imdur and  torsemide  ESRD on HD MWF --iHD per nephrology --need to set up outpatient dialysis before discharge.  Chronic diastolic CHF (congestive heart failure) (HCC) Stable and not acutely exacerbated Last known LVEF of 55 to 60% from a 2D echocardiogram which was done 12/23 Continue torsemide   DVT prophylaxis: SCD/Compression stockings Code Status: Full code  Family Communication:  Level of care: Telemetry Medical Dispo:   The patient is from: inpatient psych Anticipated d/c is to: group home Anticipated d/c date is: whenever outpatient dialysis is set up   Subjective and Interval History:  Pt reported feeling well, and eager to leave the hospital to go to her new home in [facility], however, due to her move, pt needs to be set up with a new dialysis center, therefore pt can not be discharged currently.   Objective: Vitals:   12/22/22 0232 12/22/22 0540 12/22/22 0834 12/22/22 1541  BP: 124/86 119/82 120/82 123/85  Pulse: (!) 106 100 99 98  Resp: 15 16 16 17   Temp: 98.1 F (36.7 C) 98.3 F (36.8 C) 98.3 F (36.8 C) 98.2 F (36.8 C)  TempSrc: Oral Oral Oral   SpO2: 99% 100% 100% 96%  Weight:      Height:        Intake/Output Summary (Last 24 hours) at 12/22/2022 1734 Last data filed at 12/22/2022 0232 Gross per 24 hour  Intake 666 ml  Output --  Net 666 ml   Filed Weights   12/21/22 1824  Weight: 88.2 kg    Examination:   Constitutional: NAD, AAOx3 HEENT: conjunctivae and lids normal, EOMI CV: No cyanosis.   RESP: normal respiratory effort, on RA Neuro: II - XII grossly intact.   Psych: Normal mood and affect.  Appropriate judgement and reason  Data Reviewed: I have personally reviewed labs and imaging studies  Time spent: 50 minutes  Enzo Bi, MD [facility] If 7PM-7AM, please contact night-coverage 12/22/2022, 5:34 PM

## 2022-12-22 NOTE — Plan of Care (Signed)
Patient AOX4, VSS throughout shift.  Diminished lungs, IS encouraged.  All meds given on time as ordered.  Pt left fistula dressing intact.  Pt oliguric.  Pt got one unit RBCs this shift and tolerated it well.  POC maintained, will continue to monitor.  Problem: Education: Goal: Knowledge of General Education information will improve Description: Including pain rating scale, medication(s)/side effects and non-pharmacologic comfort measures Outcome: Progressing   Problem: Health Behavior/Discharge Planning: Goal: Ability to manage health-related needs will improve Outcome: Progressing   Problem: Clinical Measurements: Goal: Ability to maintain clinical measurements within normal limits will improve Outcome: Progressing Goal: Will remain free from infection Outcome: Progressing Goal: Diagnostic test results will improve Outcome: Progressing Goal: Respiratory complications will improve Outcome: Progressing Goal: Cardiovascular complication will be avoided Outcome: Progressing   Problem: Activity: Goal: Risk for activity intolerance will decrease Outcome: Progressing   Problem: Nutrition: Goal: Adequate nutrition will be maintained Outcome: Progressing   Problem: Coping: Goal: Level of anxiety will decrease Outcome: Progressing   Problem: Elimination: Goal: Will not experience complications related to bowel motility Outcome: Progressing Goal: Will not experience complications related to urinary retention Outcome: Progressing   Problem: Pain Managment: Goal: General experience of comfort will improve Outcome: Progressing   Problem: Safety: Goal: Ability to remain free from injury will improve Outcome: Progressing   Problem: Skin Integrity: Goal: Risk for impaired skin integrity will decrease Outcome: Progressing

## 2022-12-22 NOTE — TOC Initial Note (Signed)
Transition of Care [facility]) - Initial/Assessment Note    Patient Details  Name: Natasha Long MRN: [id number] Date of Birth: February 18, 1996  Transition of [facility]) CM/SW Contact:    Candie Chroman, LCSW Phone Number: 12/22/2022, 10:40 AM  Clinical Narrative:   Per BMU social worker, patient was supposed to discharge to [facility] yesterday but had to be admitted to the hospital due to medical issues. Plan is for her to go to [facility] in [place] but HD coordinator is still working on transferring her to an HD center closer to [employer]. Patient has Medicaid. Left voicemail for [facility] Transportation representative to find out process for switching transport to this county and if they could go over [employer] to get her to HD.               Expected Discharge Plan:  [facility]) Barriers to Discharge: Waiting for outpatient dialysis, Transportation   Patient Goals and CMS Choice            Expected Discharge Plan and Services     Post Acute Care Choice: NA Living arrangements for the past 2 months: Single Family Home Expected Discharge Date: 12/22/22                                    Prior Living Arrangements/Services Living arrangements for the past 2 months: Single Family Home   Patient language and need for interpreter reviewed:: Yes        Need for Family Participation in Patient Care: Yes (Comment)     Criminal Activity/Legal Involvement Pertinent to Current Situation/Hospitalization: No - Comment as needed  Activities of Daily Living Home Assistive Devices/Equipment: None ADL Screening (condition at time of admission) Patient's cognitive ability adequate to safely complete daily activities?: Yes Is the patient deaf or have difficulty hearing?: No Does the patient have difficulty seeing, even when wearing glasses/contacts?: No Does the patient have difficulty concentrating, remembering, or making decisions?: No Patient able  to express need for assistance with ADLs?: Yes Does the patient have difficulty dressing or bathing?: No Independently performs ADLs?: Yes (appropriate for developmental age) Does the patient have difficulty walking or climbing stairs?: No Weakness of Legs: Both Weakness of Arms/Hands: None  Permission Sought/Granted                  Emotional Assessment Appearance:: Appears stated age     Orientation: : Oriented to Self, Oriented to Place, Oriented to  Time, Oriented to Situation Alcohol / Substance Use: Illicit Drugs Psych Involvement: Yes (comment) (Discharged from BMU)  Admission diagnosis:  Symptomatic anemia [D64.9] Patient Active Problem List   Diagnosis Date Noted   ABLA (acute blood loss anemia) 12/21/2022   Opiate misuse 12/14/2022   Bilateral elbow joint pain 12/13/2022   Neuropathic pain 12/13/2022   Depression with suicidal ideation 12/12/2022   Essential hypertension 12/12/2022   Postural dizziness with presyncope 12/12/2022   Symptomatic anemia 12/11/2022   Chest pain 11/02/2022   Acute pulmonary edema ([place]) 10/28/2022   Hypertensive urgency 10/25/2022   Hyperkalemia, diminished renal excretion 29/92/4268   Acute metabolic encephalopathy 34/19/6222   ESRD on hemodialysis ([place]) 09/03/2022   Chronic back pain 08/31/2022   Homelessness 08/31/2022   Mitral valve disease    Hypertensive heart and chronic kidney disease with heart failure and with stage 5 chronic kidney disease, or end stage renal disease (  [place]) 08/25/2022   Nicotine dependence, cigarettes, uncomplicated 05/23/7627   Pleural effusion    Abnormal chest x-ray 08/24/2022   Heart failure with reduced ejection fraction ([place]) 08/24/2022   Chronic diastolic CHF (congestive heart failure) (HCC)    Volume overload 06/30/2022   Community acquired pneumonia 31/51/7616   Metabolic acidosis 07/37/1062   Acute kidney injury superimposed on CKD ([place]) 06/26/2022   Uremia of renal origin 05/15/2022   High  risk sexual behavior 05/15/2022   Polysubstance abuse ([place]) 05/15/2022   Lip lesion 05/06/2022   Vaginal itching 05/06/2022   Left ankle pain 05/06/2022   Uremia    Pericardial effusion    Altered mental status 05/03/2022   Syphilis    Pain management    Septic arthritis of multiple joints ([place]) 02/01/2022   Group G streptococcal infection 02/01/2022   Left wrist pain    Hyperkalemia 01/30/2022   Effusion of right knee    Arthritis, septic [facility])    Dialysis patient, noncompliant    Lower leg pain 01/29/2022   Hyperphosphatemia 01/29/2022   Hypervolemia associated with renal insufficiency 12/23/2021   Elevated troponin 12/23/2021   ESRD (end stage renal disease) on dialysis ([place]) 12/04/2021   Tobacco use 08/11/2021   Nipple discharge 08/11/2021   Breast pain 08/11/2021   Exposure to sexually transmitted disease (STD) 69/48/5462   Complication of vascular dialysis catheter 02/12/2021   Moderate protein-calorie malnutrition ([place]) 01/27/2021   Allergy, unspecified, initial encounter 01/23/2021   Anemia in chronic kidney disease 01/23/2021   ESRD (end stage renal disease) ([place]) 01/23/2021   Iron deficiency anemia, unspecified 01/23/2021   Other specified coagulation defects ([place]) 01/23/2021   Secondary hyperparathyroidism of renal origin ([place]) 01/23/2021   Anemia    Suicidal ideation    Influenza vaccine refused 12/19/2020   Morbid obesity ([place]) 01/30/2020   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   Cocaine use, 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 [place] [address] Phone: [phone] Fax: [place] [address] [place] [address] Phone: [phone] Fax: [place] [address] Phone: [phone] Fax: [phone]  [facility] [place] [facility] [place] [address] [place] [address] Phone: [phone] Fax: [phone]     Social Determinants of Health (SDOH) Social History: SDOH Screenings   Food Insecurity: Food Insecurity Present (12/21/2022)  Housing: Medium Risk (12/21/2022)  Transportation Needs: Unmet Transportation Needs (12/21/2022)  Utilities: Not At Risk (12/21/2022)  Recent Concern: Utilities - At Risk (12/09/2022)  Alcohol Screen: Low Risk  (12/14/2022)  Depression (PHQ2-9): High Risk (08/11/2021)  Financial Resource Strain: Unknown (01/13/2019)  Physical Activity: Unknown (01/13/2019)  Social Connections: Unknown (01/13/2019)  Stress: Unknown (01/13/2019)  Tobacco Use: High Risk (12/21/2022)   SDOH Interventions:     Readmission Risk Interventions    06/29/2022   11:17 AM  Readmission Risk Prevention Plan  Transportation Screening Complete  Medication Review (RN Care Manager) Complete  PCP or Specialist appointment within  3-5 days of discharge Complete  HRI or Home Care Consult Complete  SW Recovery Care/Counseling Consult Complete  Palliative Care Screening Not [place] Not Applicable

## 2022-12-22 NOTE — Progress Notes (Signed)
12/19/22 0751 12/19/22 0800 12/19/22 0830  Vitals  Temp  --   --   --   Temp Source  --   --   --   BP 120/72 123/89 120/76  MAP (mmHg) 85 100 90  BP Location Right Arm Right Arm Right Arm  BP Method Automatic Automatic Automatic  Patient Position (if appropriate) Sitting Sitting Sitting  Pulse Rate (!) 101 (!) 110 (!) 109  Pulse Rate Source Monitor Monitor Monitor  ECG Heart Rate (!) 101 (!) 108 (!) 109  Resp (!) 23 18 17   During Treatment Monitoring  Blood Flow Rate (mL/min) 400 mL/min 400 mL/min 400 mL/min  Arterial Pressure (mmHg) -100 mmHg -90 mmHg -130 mmHg  Venous Pressure (mmHg) 190 mmHg 210 mmHg 220 mmHg  TMP (mmHg) 18 mmHg 17 mmHg -3 mmHg  Ultrafiltration Rate (mL/min) 1169 mL/min 915 mL/min 1147 mL/min  Dialysate Flow Rate (mL/min) 300 ml/min 300 ml/min  (UF only)  HD Safety Checks Performed Yes Yes Yes  Intra-Hemodialysis Comments Tx initiated Progressing as prescribed Progressing as prescribed;Tolerated well  Dialysis Fluid Bolus Normal Saline Normal Saline  --   Dialysate Change 2K;2.5 Ca  --   --     12/19/22 0900 12/19/22 0930 12/19/22 1000  Vitals  Temp  --   --   --   Temp Source  --   --   --   BP 127/72 124/71 125/72  MAP (mmHg) 87 87 87  BP Location Right Arm Right Arm Right Arm  BP Method Automatic Automatic Automatic  Patient Position (if appropriate) Lying Lying Lying  Pulse Rate (!) 105 (!) 105 (!) 105  Pulse Rate Source Monitor Monitor Monitor  ECG Heart Rate (!) 105 (!) 105 (!) 105  Resp (!) 26 (!) 24 (!) 24  During Treatment Monitoring  Blood Flow Rate (mL/min) 400 mL/min 400 mL/min 400 mL/min  Arterial Pressure (mmHg) -140 mmHg -130 mmHg -130 mmHg  Venous Pressure (mmHg) 220 mmHg 220 mmHg 220 mmHg  TMP (mmHg) -3 mmHg -3 mmHg  --   Ultrafiltration Rate (mL/min) 1147 mL/min 1147 mL/min 1147 mL/min  Dialysate Flow Rate (mL/min)  (UF only)  (UF only)  (UF only)  HD Safety Checks Performed Yes Yes Yes  Intra-Hemodialysis Comments  Progressing as prescribed Progressing as prescribed;Tolerated well Progressing as prescribed;Tolerated well  Dialysis Fluid Bolus Normal Saline  --   --   Dialysate Change  --   --   --     12/19/22 1030 12/19/22 1100 12/19/22 1116  Vitals  Temp  --   --   --   Temp Source  --   --   --   BP (!) 122/90 (!) 141/94 119/73  MAP (mmHg) 101 107 88  BP Location Right Arm Right Arm Right Arm  BP Method Automatic Automatic Automatic  Patient Position (if appropriate) Lying Lying Lying  Pulse Rate (!) 103 (!) 108 (!) 106  Pulse Rate Source Monitor Monitor Monitor  ECG Heart Rate (!) 103 (!) 108 (!) 106  Resp 19 19 (!) 22  During Treatment Monitoring  Blood Flow Rate (mL/min) 400 mL/min 400 mL/min  --   Arterial Pressure (mmHg) -130 mmHg -130 mmHg  --   Venous Pressure (mmHg) 230 mmHg 280 mmHg  --   TMP (mmHg) -2 mmHg -2 mmHg  --   Ultrafiltration Rate (mL/min) 1147 mL/min 1148 mL/min  --   Dialysate Flow Rate (mL/min)  (UF only)  (UF only)  --  HD Safety Checks Performed Yes Yes  --   Intra-Hemodialysis Comments Progressing as prescribed;Tolerated well Progressing as prescribed;Tolerated well Tx completed  Dialysis Fluid Bolus  --   --   --   Dialysate Change  --   --   --     12/21/22 1335 12/21/22 1400 12/21/22 1430  Vitals  Temp  --   --   --   Temp Source  --   --   --   BP 125/79 129/83 (!) 135/92  MAP (mmHg) 93 98 105  BP Location Right Arm Right Arm Right Arm  BP Method Automatic Automatic Automatic  Patient Position (if appropriate) Sitting Sitting Sitting  Pulse Rate 95 97 98  Pulse Rate Source Monitor Monitor Monitor  ECG Heart Rate 95 96 98  Resp (!) 24 (!) 23 (!) 23  During Treatment Monitoring  Blood Flow Rate (mL/min) 400 mL/min 400 mL/min 400 mL/min  Arterial Pressure (mmHg) -130 mmHg -130 mmHg -130 mmHg  Venous Pressure (mmHg) 230 mmHg 240 mmHg 240 mmHg  TMP (mmHg) 14 mmHg 13 mmHg 16 mmHg  Ultrafiltration Rate (mL/min) 1114 mL/min 1114 mL/min 1114 mL/min   Dialysate Flow Rate (mL/min) 300 ml/min 300 ml/min 300 ml/min  HD Safety Checks Performed Yes Yes Yes  Intra-Hemodialysis Comments Tx initiated Progressing as prescribed Progressing as prescribed;Tolerated well  Dialysis Fluid Bolus Normal Saline  --   --   Dialysate Change 2K;2.5 Ca  --   --     12/21/22 1500 12/21/22 1600 12/21/22 1630  Vitals  Temp  --   --   --   Temp Source  --   --   --   BP 134/86 136/89 133/81  MAP (mmHg) 100 103 97  BP Location Right Arm Right Arm Right Arm  BP Method Automatic Automatic Automatic  Patient Position (if appropriate) Sitting Sitting Sitting  Pulse Rate 100 (!) 104 (!) 104  Pulse Rate Source Monitor Monitor Monitor  ECG Heart Rate 100 (!) 104 (!) 104  Resp (!) 23 (!) 26 (!) 22  During Treatment Monitoring  Blood Flow Rate (mL/min) 400 mL/min 400 mL/min 400 mL/min  Arterial Pressure (mmHg) -130 mmHg -130 mmHg -100 mmHg  Venous Pressure (mmHg) 240 mmHg 240 mmHg 300 mmHg  TMP (mmHg) 17 mmHg 16 mmHg 11 mmHg  Ultrafiltration Rate (mL/min) 1114 mL/min 1072 mL/min 1072 mL/min  Dialysate Flow Rate (mL/min) 300 ml/min 300 ml/min 300 ml/min  HD Safety Checks Performed Yes Yes Yes  Intra-Hemodialysis Comments Progressing as prescribed Progressing as prescribed Progressing as prescribed  Dialysis Fluid Bolus  --   --   --   Dialysate Change  --   --   --     12/21/22 1648  Vitals  Temp 98.4 F (36.9 C)  Temp Source Oral  BP 132/81  MAP (mmHg) 97  BP Location Right Arm  BP Method Automatic  Patient Position (if appropriate) Lying  Pulse Rate (!) 102  Pulse Rate Source Monitor  ECG Heart Rate (!) 102  Resp (!) 21  During Treatment Monitoring  Blood Flow Rate (mL/min) 400 mL/min  Arterial Pressure (mmHg) -100 mmHg  Venous Pressure (mmHg) 300 mmHg  TMP (mmHg) 11 mmHg  Ultrafiltration Rate (mL/min) 1072 mL/min  Dialysate Flow Rate (mL/min) 300 ml/min  HD Safety Checks Performed Yes  Intra-Hemodialysis Comments Tx completed  Dialysis Fluid  Bolus  --   Dialysate Change  --

## 2022-12-22 NOTE — Discharge Planning (Signed)
[facility] denied patient, referral rerouted to [place] and sent to [facility], both under review.

## 2022-12-22 NOTE — Consult Note (Signed)
[facility] Consult   Reason for Consult: Consult to follow-up on this 27 year old woman with chronic mood and anxiety problems who was transferred to the medical unit for transfusion Referring Physician:  Billie Ruddy Patient Identification: Natasha Long MRN:  [id number] Principal Diagnosis: Severe recurrent major depression without psychotic features ([place]) Diagnosis:  Principal Problem:   Severe recurrent major depression without psychotic features ([place]) Active Problems:   HTN (hypertension)   ESRD (end stage renal disease) ([place])   Chronic diastolic CHF (congestive heart failure) (HCC)   Symptomatic anemia   ABLA (acute blood loss anemia)   Total Time spent with patient: 20 minutes  Subjective:   Natasha Long is a 27 y.o. female patient admitted with "I am frustrated".  HPI: Patient seen and chart reviewed.  Patient known from a recent psychiatric hospitalization.  This is a 27 year old woman with chronic mental health problems with episodes of depression, mood lability, behavior problems.  Diagnoses varied from bipolar 2 depression to PTSD.  Almost certainly has a component of PTSD.  Additionally patient has longstanding substance use problems and faces profound social obstacles and always has.  She also has severe medical problems and is on hemodialysis at a very young age.  Patient had been stabilized psychiatrically and was ready for discharge on Monday but during her dialysis session it was noted that her hemoglobin had dropped to 6.6.  Patient has been having recurrent episodes of anemia with her hemoglobin drifting down constantly.  I spoke with Dr. Holley Raring, who was on call for nephrology and we agreed that it would be much better and safer for the patient to get a transfusion prior to her discharge.  For this reason we ask that she be transferred to medicine to get a blood transfusion.  Today the patient states that she is frustrated at still being in the hospital.  She  was awake alert and appropriate in her conversation with me.  She understood that the issue is that they were not able to get a definitive plan to set her up with the dialysis center however she says now as a result she has lost the bed at the [facility].  She is unhappy but not rageful.  Denies any suicidal or homicidal ideation.  There is no evidence of acute psychosis.  I appreciate her psychiatric medicines from downstairs having being continued.  Past Psychiatric History: Longstanding history as noted above complex mental health problems substance abuse problems severe social problems.  Risk to Self:   Risk to Others:   Prior Inpatient Therapy:   Prior Outpatient Therapy:    Past Medical History:  Past Medical History:  Diagnosis Date   Asthma    as a child, no problem as an adult, no inhaler   CHF (congestive heart failure) (HCC)    Complication of anesthesia    woke up before tube removed, 1 time fought nurses   ESRD on hemodialysis [facility])    M-W-F   History of borderline personality disorder    Hypertension    diagnosed as child; stopped meds at 65 yo   Insomnia    Neuromuscular disorder ([place])    peripheral neuropathy   Nonspecific chest pain    PTSD (post-traumatic stress disorder)     Past Surgical History:  Procedure Laterality Date   AV FISTULA PLACEMENT Left 10/18/2020   Procedure: LEFT ARM ARTERIOVENOUS (AV) FISTULA CREATION;  Surgeon: Serafina Mitchell, MD;  Location: [facility];  Service: Vascular;  Laterality: Left;  CHOLECYSTECTOMY     extraction of wisdom teeth     FISTULA SUPERFICIALIZATION Left 02/13/2021   Procedure: LEFT BRACHIOCEPHALIC ARTERIOVENOUS FISTULA SUPERFICIALIZATION;  Surgeon: Serafina Mitchell, MD;  Location: [place];  Service: Vascular;  Laterality: Left;   I & D EXTREMITY Left 01/30/2022   Procedure: IRRIGATION AND DEBRIDEMENT EXTREMITY;  Surgeon: Milly Jakob, MD;  Location: [facility];  Service: Orthopedics;  Laterality: Left;   INCISION AND DRAINAGE  OF WOUND Left 01/30/2022   Procedure: LEFT WRIST ASPIRATION;  Surgeon: Vanetta Mulders, MD;  Location: [facility];  Service: Orthopedics;  Laterality: Left;   KNEE ARTHROSCOPY Right 01/30/2022   Procedure: ARTHROSCOPY KNEE AND IRRIIGATION AND DEBRIDMENT; LEFT WRIST ASPIRATION;  Surgeon: Vanetta Mulders, MD;  Location: [facility];  Service: Orthopedics;  Laterality: Right;   RENAL BIOPSY     x 2   TUNNELED VENOUS CATHETER PLACEMENT  02/11/2021   [facility]   Family History:  Family History  Adopted: Yes  Problem Relation Age of Onset   Diabetes Other    Hypertension Other    Family Psychiatric  History: Nothing definitive although patient has mentioned some substance use problems in the family Social History:  Social History   Substance and Sexual Activity  Alcohol Use Not Currently   Alcohol/week: 1.0 standard drink of alcohol   Types: 1 Shots of liquor per week     Social History   Substance and Sexual Activity  Drug Use Yes   Types: Marijuana, Cocaine, Oxycodone   Comment: daily cocaine use; cannabis use 1x weekly; Rx opiates 1x monthly    Social History   Socioeconomic History   Marital status: Soil scientist    Spouse name: Not on file   Number of children: 1   Years of education: Not on file   Highest education level: 12th grade  Occupational History   Occupation: unemployed  Tobacco Use   Smoking status: Every Day    Packs/day: 0.50    Years: 10.00    Total pack years: 5.00    Types: Cigarettes   Smokeless tobacco: Never  Vaping Use   Vaping Use: Some days  Substance and Sexual Activity   Alcohol use: Not Currently    Alcohol/week: 1.0 standard drink of alcohol    Types: 1 Shots of liquor per week   Drug use: Yes    Types: Marijuana, Cocaine, Oxycodone    Comment: daily cocaine use; cannabis use 1x weekly; Rx opiates 1x monthly   Sexual activity: Yes    Partners: Male    Birth control/protection: None    Comment: reports exchanging sex for drugs  Other  Topics Concern   Not on file  Social History Narrative   Not on file   Social Determinants of Health   Financial Resource Strain: Unknown (01/13/2019)   Overall Financial Resource Strain (CARDIA)    Difficulty of Paying Living Expenses: Patient refused  Food Insecurity: Food Insecurity Present (12/21/2022)   Hunger Vital Sign    Worried About Running Out of Food in the Last Year: Sometimes true    Ran Out of Food in the Last Year: Sometimes true  Transportation Needs: Unmet Transportation Needs (12/21/2022)   PRAPARE - Transportation    Lack of Transportation (Medical): Yes    Lack of Transportation (Non-Medical): Yes  Physical Activity: Unknown (01/13/2019)   Exercise Vital Sign    Days of Exercise per Week: Patient refused    Minutes of Exercise per Session: Patient refused  Stress: Unknown (  01/13/2019)   [place]    Feeling of Stress : Patient refused  Social Connections: Unknown (01/13/2019)   Social Connection and Isolation Panel [NHANES]    Frequency of Communication with Friends and Family: Patient refused    Frequency of Social Gatherings with Friends and Family: Patient refused    Attends Religious Services: Patient refused    Marine scientist or Organizations: Patient refused    Attends Archivist Meetings: Patient refused    Marital Status: Patient refused   Additional Social History:    Allergies:   Allergies  Allergen Reactions   Bupropion Anxiety, Other (See Comments) and Nausea And Vomiting    Caused panic attacks  Caused panic attacks    "Adverse effects"   Prozac [Fluoxetine Hcl] Anxiety and Other (See Comments)    Caused panic attacks. Dizziness    Prednisone Other (See Comments)    Pt stated this med caused pancreatitis. Dizziness    Labs:  Results for orders placed or performed during the hospital encounter of 12/21/22 (from the past 48 hour(s))  Prepare RBC  (crossmatch)     Status: None   Collection Time: 12/21/22  6:33 PM  Result Value Ref Range   Order Confirmation      ORDER PROCESSED BY BLOOD BANK Performed at [facility], [address] [place], [place] [address]   Type and screen [place]     Status: None   Collection Time: 12/21/22  8:03 PM  Result Value Ref Range   ABO/RH(D) A NEG    Antibody Screen NEG    Sample Expiration 12/24/2022,2359    Unit Number [id number]    Blood Component Type RED CELLS,LR    Unit division 00    Status of Unit ISSUED,FINAL    Transfusion Status OK TO TRANSFUSE    Crossmatch Result      Compatible Performed at [facility], [address] [place], [place] [address]   CBC     Status: Abnormal   Collection Time: 12/22/22  5:37 AM  Result Value Ref Range   WBC 8.1 4.0 - 10.5 K/uL   RBC 2.80 (L) 3.87 - 5.11 MIL/uL   Hemoglobin 7.9 (L) 12.0 - 15.0 g/dL   HCT 25.8 (L) 36.0 - 46.0 %   MCV 92.1 80.0 - 100.0 fL   MCH 28.2 26.0 - 34.0 pg   MCHC 30.6 30.0 - 36.0 g/dL   RDW 17.0 (H) 11.5 - 15.5 %   Platelets 230 150 - 400 K/uL   nRBC 0.0 0.0 - 0.2 %    Comment: Performed at [facility], [address] [place], [place] [address]  Basic metabolic panel     Status: Abnormal   Collection Time: 12/22/22  5:37 AM  Result Value Ref Range   Sodium 136 135 - 145 mmol/L   Potassium 4.9 3.5 - 5.1 mmol/L   Chloride 99 98 - 111 mmol/L   CO2 26 22 - 32 mmol/L   Glucose, Bld 87 70 - 99 mg/dL    Comment: Glucose reference range applies only to samples taken after fasting for at least 8 hours.   BUN 62 (H) 6 - 20 mg/dL   Creatinine, Ser 5.38 (H) 0.44 - 1.00 mg/dL   Calcium 9.4 8.9 - 10.3 mg/dL   GFR, Estimated 10 (L) >60 mL/min    Comment: (NOTE) Calculated using the CKD-EPI Creatinine Equation (2021)  Anion gap 11 5 - 15    Comment: Performed at [facility], [address] [place], [place] [address]    Current Facility-Administered  Medications  Medication Dose Route Frequency Provider Last Rate Last Admin   alteplase (CATHFLO ACTIVASE) injection 2 mg  2 mg Intracatheter Once PRN Colon Flattery, NP       anticoagulant sodium citrate solution 5 mL  5 mL Intracatheter PRN Colon Flattery, NP       ARIPiprazole (ABILIFY) tablet 7.5 mg  7.5 mg Oral Daily Agbata, Tochukwu, MD   7.5 mg at 12/22/22 8242   calcitRIOL (ROCALTROL) capsule 0.25 mcg  0.25 mcg Oral Q T,Th,Sa-HD Agbata, Tochukwu, MD   0.25 mcg at 12/22/22 1257   calcium acetate (PHOSLO) capsule 667 mg  667 mg Oral TID WC Agbata, Tochukwu, MD   667 mg at 12/22/22 1257   carvedilol (COREG) tablet 6.25 mg  6.25 mg Oral BID WC Agbata, Tochukwu, MD   6.25 mg at 12/22/22 0938   [START ON 12/23/2022] Chlorhexidine Gluconate Cloth 2 % PADS 6 each  6 each Topical Q0600 Breeze, Shantelle, NP       DULoxetine (CYMBALTA) DR capsule 20 mg  20 mg Oral Daily Agbata, Tochukwu, MD   20 mg at 12/22/22 3536   gabapentin (NEURONTIN) capsule 100 mg  100 mg Oral BID Agbata, Tochukwu, MD   100 mg at 12/22/22 1443   heparin injection 1,000 Units  1,000 Units Intracatheter PRN Colon Flattery, NP       hydrALAZINE (APRESOLINE) tablet 50 mg  50 mg Oral Q8H Agbata, Tochukwu, MD   50 mg at 12/22/22 1457   isosorbide mononitrate (IMDUR) 24 hr tablet 60 mg  60 mg Oral Daily Agbata, Tochukwu, MD   60 mg at 12/22/22 0938   lidocaine (PF) (XYLOCAINE) 1 % injection 5 mL  5 mL Intradermal PRN Breeze, Benancio Deeds, NP       lidocaine-prilocaine (EMLA) cream 1 Application  1 Application Topical PRN Agbata, Tochukwu, MD       ondansetron (ZOFRAN) tablet 4 mg  4 mg Oral Q6H PRN Agbata, Tochukwu, MD       Or   ondansetron (ZOFRAN) injection 4 mg  4 mg Intravenous Q6H PRN Agbata, Tochukwu, MD       pentafluoroprop-tetrafluoroeth (GEBAUERS) aerosol 1 Application  1 Application Topical PRN Colon Flattery, NP       torsemide (DEMADEX) tablet 100 mg  100 mg Oral q morning Agbata, Tochukwu, MD   100 mg at  12/22/22 1540   traZODone (DESYREL) tablet 25 mg  25 mg Oral QHS PRN Agbata, Tochukwu, MD        Musculoskeletal: Strength & Muscle Tone: within normal limits Gait & Station: unsteady Patient leans: N/A            Psychiatric Specialty Exam:  Presentation  General Appearance:  Casual  Eye Contact: Fair  Speech: Clear and Coherent  Speech Volume: Normal  Handedness: Right   Mood and Affect  Mood: Depressed; Anxious  Affect: Congruent; Depressed   Thought Process  Thought Processes: Coherent  Descriptions of Associations:Intact  Orientation:Full (Time, Place and Person)  Thought Content:Illogical  History of Schizophrenia/Schizoaffective disorder:No  Duration of Psychotic Symptoms:No data recorded Hallucinations:No data recorded Ideas of Reference:None  Suicidal Thoughts:No data recorded Homicidal Thoughts:No data recorded  Sensorium  Memory: Immediate Good; Recent Good; Remote Good  Judgment: -- (impulsive)  Insight: Lacking   Executive Functions  Concentration: Fair  Attention Span: Fair  Recall:  [place] of Knowledge: Good  Language: Good   Psychomotor Activity  Psychomotor Activity:No data recorded  Assets  Assets: Communication Skills; Desire for Improvement; Financial Resources/Insurance   Sleep  Sleep:No data recorded  Physical Exam: Physical Exam Vitals and nursing note reviewed.  Constitutional:      Appearance: Normal appearance.  HENT:     Head: Normocephalic and atraumatic.     Mouth/Throat:     Pharynx: Oropharynx is clear.  Eyes:     Pupils: Pupils are equal, round, and reactive to light.  Cardiovascular:     Rate and Rhythm: Normal rate and regular rhythm.  Pulmonary:     Effort: Pulmonary effort is normal.     Breath sounds: Normal breath sounds.  Abdominal:     General: Abdomen is flat.     Palpations: Abdomen is soft.  Musculoskeletal:        General: Normal range of motion.   Skin:    General: Skin is warm and dry.  Neurological:     General: No focal deficit present.     Mental Status: She is alert. Mental status is at baseline.  Psychiatric:        Attention and Perception: Attention normal.        Mood and Affect: Mood normal.        Speech: Speech normal.        Behavior: Behavior normal.        Thought Content: Thought content normal.        Cognition and Memory: Cognition normal.    Review of Systems  Constitutional: Negative.   HENT: Negative.    Eyes: Negative.   Respiratory: Negative.    Cardiovascular: Negative.   Gastrointestinal: Negative.   Musculoskeletal: Negative.   Skin: Negative.   Neurological: Negative.   Psychiatric/Behavioral:  Negative for depression, hallucinations, substance abuse and suicidal ideas. The patient is nervous/anxious.    Blood pressure 123/85, pulse 98, temperature 98.2 F (36.8 C), resp. rate 17, height 5\' 8"  (1.727 m), weight 88.2 kg, SpO2 96 %. Body mass index is 29.57 kg/m.  Treatment Plan Summary: Plan offered empathy and support to the patient.  If it is true that she lost her [facility] bed I am very sorry and offered my empathy while reaffirming that it really could have been life threatening to leave here with her blood so low.  She says that she has a phone conversation with a different [facility].  We can reevaluate this in the morning.  If it looks like discharge is going to be delayed past tomorrow morning or so it might be better to transfer her back to psychiatry although she is still psychiatrically stable.  No change to any of her medicine.  Support and encouragement offered.  Disposition: Supportive therapy provided about ongoing stressors.  Alethia Berthold, MD 12/22/2022 4:51 PM

## 2022-12-22 NOTE — Discharge Planning (Signed)
Met with patient today. Discussed importance of remaining admitted until dialysis placement is confirmed. Patient stated "if I can get a ride I will leave today". I informed her that was her choice to make but that it will make it very difficult to continue placement if she leaves AMA. I informed patient that she was denied by one clinic already due to history of past behaviors and leaving would not help her case in getting accepted by a clinic. Patient appeared to understand consequences but remained adamant that if she could find a ride she would leave. Patient also stated that she would set herself up at a center or go to an emergency room. I encourage  patient to stay at least another 24 hours to get confirmation on clinical acceptance. Patient patient went silent. I informed her that I would call her as soon as heard something form the clinic.

## 2022-12-22 NOTE — Discharge Planning (Incomplete)
Currently still working on dialysis transfer. [facility]rier is transportation and verifying that patient will have transportation to and from a clinic that is 17 miles away and crossing [employer] [address].

## 2022-12-22 NOTE — Progress Notes (Signed)
Pt alert and orientated x 4, leaving AMA, pt educated and informed of the risk and dangers of leaving at this time. Pt verbalized an understanding but continues to endorse leaving AMA. MD notified and at bedside, PIV removed, AMA paperwork signed, pt escorted to ED.

## 2022-12-23 ENCOUNTER — Telehealth: Payer: Self-pay

## 2022-12-23 NOTE — Telephone Encounter (Signed)
Transition Care Management Unsuccessful Follow-up Telephone Call  Date of discharge and from where:  12/22/2022, [facility]- left AMA  Attempts:  1st Attempt  Reason for unsuccessful TCM follow-up call:  Unable to reach patient- [phone], the recording stated that the call cannot be completed as dialed.  I also called [phone] and the phone just rings, no option to leave a message.   Per the congregational nurse's note from today, the patient is going to [facility].

## 2022-12-23 NOTE — Congregational Nurse Program (Signed)
  Dept: [phone]   Congregational Nurse Program Note  Date of Encounter: 12/23/2022 Client to [facility] day center with request for a bus ticket to [place]. She was recently discharged from [facility], was receiving hemodialysis three times a week, reports her last dialysis was on Monday. Client repeatedly said she was not "worried about dialysis" she just wanted to concentrate on her housing. She wanted to go to [place]. RN strongly encouraged client to return to her dialysis center in [place], she declined and again repeated she was not worried about her dialysis. Per chart note review, client left the hospital against medical advice. [facility] was not able to accommodate her request for a bus ticket to [place]. She was given clothing and  suitcase and food. Past Medical History: Past Medical History:  Diagnosis Date   Asthma    as a child, no problem as an adult, no inhaler   CHF (congestive heart failure) (HCC)    Complication of anesthesia    woke up before tube removed, 1 time fought nurses   ESRD on hemodialysis [facility])    M-W-F   History of borderline personality disorder    Hypertension    diagnosed as child; stopped meds at 5 yo   Insomnia    Neuromuscular disorder ([place])    peripheral neuropathy   Nonspecific chest pain    PTSD (post-traumatic stress disorder)     Encounter Details:  CNP Questionnaire - 12/23/22 1030       Questionnaire   Ask client: Do you give verbal consent for me to treat you today? Yes    Student Assistance N/A    Location Patient [place]    Visit Setting with Client Organization    Patient Status Unhoused    Insurance Medicaid    Insurance/Financial Assistance Referral N/A    Medication N/A   client was unclear about medication   Medical Provider No   [facility] local in [place] at this time. She was familiar with the [facility] and [facility] in [place], but did not wish to retrun   Screening Referrals Made N/A    Medical  Referrals Made N/A    Medical Appointment Made N/A    Recently w/o PCP, now 1st time PCP visit completed due to CNs referral or appointment made N/A    Food Have Food Insecurities    Transportation Need transportation assistance   client was asking for trnasportation to be provided to [place], unable to provide   Housing/Utilities No permanent housing   client wishes to go to [facility] for a Diplomatic Services operational officer N/A    Interventions Advocate/Support    Abnormal to Normal Screening Since Last CN Visit N/A    Screenings CN Performed N/A    Sent Client to Lab for: N/A    Did client attend any of the following based off CNs referral or appointments made? N/A    ED Visit Averted N/A    Life-Saving Intervention Made N/A

## 2022-12-24 ENCOUNTER — Telehealth: Payer: Self-pay

## 2022-12-24 NOTE — Discharge Summary (Signed)
Physician [facility] Discharge Summary   Natasha Long  female DOB: [birth year]  DQQ:[id number]  PCP: [name], MD  Admit date: 12/21/2022 Discharge date: 12/22/2022  Admitted From: inpatient psych AMA Disposition:  unknown CODE STATUS: Full code   Hospital Course:  For full details, please see H&P, progress notes, consult notes and ancillary notes.  Briefly,  Natasha Long is a 27 y.o. female with medical history significant for hypertension, end-stage renal disease on hemodialysis (M/W/F), borderline personality disorder, posttraumatic stress disorder, chronic diastolic dysfunction CHF, moderate cannabis use disorder, chronic low back pain, recent hospitalization to the behavioral health unit for depression with suicidal ideation who was admitted for anemia needing blood transfusion.   Pt was inpatient in behavioral health unit for management of her ongoing severe depression with suicidal ideations when lab showed Hgb 6.6.  She reported several episodes of epistaxis while in the behavioral health unit.  Pt was transfused 1u pRBC and medically ready for discharge (and psych confirmed pt had no need to return to inpatient psych unit). However, due to pt [place] to a different city, outpatient dialysis hasn't been set up, so pt was kept inpatient pending acceptance to an outpatient dialysis center.    Night of 12/22/22, pt decided to leave AMA, pls see note from night covering provider NP [place].  Anemia of chronic kidney disease Exacerbated by blood loss  --anemia workup showed no def in iron, Vit B12 or folate.  Pt has baseline anemia, and blood loss from epistaxis likely contributed to the Hgb drop --s/p 1u pRBC for Hgb 6.6, with appropriate increase to 7.9 --Epo with dialysis   Recurrent epistaxis --pt reported it's always from the right nostril. --recommend pt see outpatient ENT at her new home location, for consideration of cauterization of bleeding vessel.   * Severe  recurrent major depression without psychotic features ([place]) --Admitted to behavioral health unit for stabilization of major depression with suicidal ideation in the setting of homelessness, and discharged on 12/21/22.  Patient has improved and has been cleared from psychiatry. Continue trazodone, Cymbalta and aripiprazole (both oral daily and IM injection q28d)   HTN (hypertension) Continue carvedilol, hydralazine, Imdur and torsemide   ESRD on HD MWF --iHD per nephrology --need to set up outpatient dialysis before discharge, however, pt left AMA.   Chronic diastolic CHF (congestive heart failure) (HCC) Stable and not acutely exacerbated Last known LVEF of 55 to 60% from a 2D echocardiogram which was done 12/23 Continue torsemide   Discharge Diagnoses:  Principal Problem:   Severe recurrent major depression without psychotic features ([place]) Active Problems:   Symptomatic anemia   ABLA (acute blood loss anemia)   HTN (hypertension)   ESRD (end stage renal disease) (HCC)   Chronic diastolic CHF (congestive heart failure) ([place])   30 Day Unplanned Readmission Risk Score    Flowsheet Row Admission (Discharged) from 12/11/2022 in [facility]  30 Day Unplanned Readmission Risk Score (%) 90.07 Filed at 12/14/2022 1200       This score is the patient's risk of an unplanned readmission within 30 days of being discharged (0 -100%). The score is based on dignosis, age, lab data, medications, orders, and past utilization.   Low:  0-14.9   Medium: 15-21.9   High: 22-29.9   Extreme: 30 and above         Discharge Instructions:  Allergies as of 12/22/2022       Reactions   Bupropion Anxiety, Other (See  Comments), Nausea And Vomiting   Caused panic attacks Caused panic attacks    "Adverse effects"   Prozac [fluoxetine Hcl] Anxiety, Other (See Comments)   Caused panic attacks. Dizziness    Prednisone Other (See Comments)   Pt stated this med  caused pancreatitis. Dizziness        Medication List     STOP taking these medications    Chlorhexidine Gluconate Cloth 2 % Pads       TAKE these medications    alteplase 2 MG injection Commonly known as: CATHFLO ACTIVASE 2 mg by Intracatheter route once as needed for open catheter.   anticoagulant sodium citrate 4 GM/100ML Soln 5 mLs by Intracatheter route as needed (in dialysis, If TTP or HIT.).   ARIPiprazole 5 MG tablet Commonly known as: ABILIFY Take 1.5 tablets (7.5 mg total) by mouth daily. What changed: Another medication with the same name was changed. Make sure you understand how and when to take each.   ARIPiprazole ER 400 MG Srer injection Commonly known as: ABILIFY MAINTENA Inject 2 mLs (400 mg total) into the muscle every 28 (twenty-eight) days. Starting January 16, 2023. Start taking on: January 16, 2023 What changed: additional instructions   calcitRIOL 0.25 MCG capsule Commonly known as: ROCALTROL Take 1 capsule (0.25 mcg total) by mouth Every Tuesday,Thursday,and Saturday with dialysis.   calcium acetate 667 MG capsule Commonly known as: PHOSLO Take 1 capsule (667 mg total) by mouth 3 (three) times daily with meals.   carvedilol 6.25 MG tablet Commonly known as: COREG Take 1 tablet (6.25 mg total) by mouth 2 (two) times daily with a meal.   Darbepoetin Alfa 200 MCG/0.4ML Sosy injection Commonly known as: ARANESP Inject 0.4 mLs (200 mcg total) into the skin every Wednesday at 6 PM.   diclofenac Sodium 1 % Gel Commonly known as: VOLTAREN Apply 2 g topically 4 (four) times daily.   DULoxetine 20 MG capsule Commonly known as: CYMBALTA Take 1 capsule (20 mg total) by mouth daily.   gabapentin 100 MG capsule Commonly known as: NEURONTIN Take 1 capsule (100 mg total) by mouth 2 (two) times daily.   gentamicin ointment 0.1 % Commonly known as: GARAMYCIN Apply topically 3 (three) times daily.   heparin 1000 unit/mL Soln injection 1 mL  (1,000 Units total) by Intracatheter route as needed (in dialysis).   hydrALAZINE 50 MG tablet Commonly known as: APRESOLINE Take 1 tablet (50 mg total) by mouth every 8 (eight) hours.   isosorbide mononitrate 60 MG 24 hr tablet Commonly known as: IMDUR Take 1 tablet (60 mg total) by mouth daily.   lidocaine-prilocaine cream Commonly known as: EMLA Apply 1 Application topically as needed (topical anesthesia for hemodialysis if Gebauers and Lidocaine injection are ineffective.).   pentafluoroprop-tetrafluoroeth Aero Commonly known as: GEBAUERS Apply 1 Application topically as needed (topical anesthesia for hemodialysis).   phenylephrine 0.25 % nasal spray Commonly known as: NEO-SYNEPHRINE Place 2 sprays into both nostrils every 4 (four) hours as needed (nosebleeding)   sevelamer carbonate 800 MG tablet Commonly known as: RENVELA Take 2 tablets (1,600 mg total) by mouth 3 (three) times daily with meals.   torsemide 100 MG tablet Commonly known as: DEMADEX Take 1 tablet (100 mg total) by mouth every morning.   traZODone 50 MG tablet Commonly known as: DESYREL Take 0.5 tablets (25 mg total) by mouth at bedtime as needed for sleep.         Follow-up Information     Beverly Gust, MD Follow  up.   Specialty: Otolaryngology Why: Office requested that patient call to schedule. Contact information: [place] [phone]                 Allergies  Allergen Reactions   Bupropion Anxiety, Other (See Comments) and Nausea And Vomiting    Caused panic attacks  Caused panic attacks    "Adverse effects"   Prozac [Fluoxetine Hcl] Anxiety and Other (See Comments)    Caused panic attacks. Dizziness    Prednisone Other (See Comments)    Pt stated this med caused pancreatitis. Dizziness     The results of significant diagnostics from this hospitalization (including imaging, microbiology, ancillary and laboratory)  are listed below for reference.   Consultations:   Procedures/Studies: DG Elbow 2 Views Right  Result Date: 12/13/2022 CLINICAL DATA:  Right elbow pain EXAM: RIGHT ELBOW - 2 VIEW COMPARISON:  None Available. FINDINGS: There is no evidence of fracture, dislocation, or joint effusion. There is no evidence of arthropathy or other focal bone abnormality. Prominent circumferential soft tissue swelling. Atherosclerotic vascular calcification, advanced for age. Nonspecific soft tissue calcification along the dorsal aspect of the proximal forearm. IMPRESSION: 1. No acute osseous abnormality of the right elbow. 2. Prominent circumferential soft tissue swelling. 3. Atherosclerotic vascular calcification, advanced for age. Electronically Signed   By: Davina Poke D.O.   On: 12/13/2022 11:30   CT Head Wo Contrast  Result Date: 12/04/2022 CLINICAL DATA:  Altered level of consciousness EXAM: CT HEAD WITHOUT CONTRAST TECHNIQUE: Contiguous axial images were obtained from the base of the skull through the vertex without intravenous contrast. RADIATION DOSE REDUCTION: This exam was performed according to the departmental dose-optimization program which includes automated exposure control, adjustment of the mA and/or kV according to patient size and/or use of iterative reconstruction technique. COMPARISON:  10/04/2022 FINDINGS: Brain: No acute infarct or hemorrhage. Lateral ventricles and midline structures are unremarkable. No acute extra-axial fluid collections. No mass effect. Vascular: No hyperdense vessel or unexpected calcification. Skull: Normal. Negative for fracture or focal lesion. Sinuses/Orbits: No acute finding. Other: None. IMPRESSION: 1. Stable head CT, no acute intracranial process. Electronically Signed   By: Randa Ngo M.D.   On: 12/04/2022 16:13   DG Chest Portable 1 View  Result Date: 12/04/2022 CLINICAL DATA:  Altered mental status EXAM: PORTABLE CHEST 1 VIEW COMPARISON:  Chest radiograph  dated 11/25/2022 FINDINGS: Low lung volumes. Bibasilar patchy opacities. Small bilateral pleural effusions. No pneumothorax. Similar enlarged cardiomediastinal silhouette. The visualized skeletal structures are unremarkable. IMPRESSION: 1. Low lung volumes with bibasilar patchy opacities, likely atelectasis. 2. Small bilateral pleural effusions.  Unchanged cardiomegaly. Electronically Signed   By: Darrin Nipper M.D.   On: 12/04/2022 14:49   DG Chest 2 View  Result Date: 11/26/2022 CLINICAL DATA:  Missed dialysis. Shortness of breath. EXAM: CHEST - 2 VIEW COMPARISON:  11/16/2022 FINDINGS: Stable cardiac enlargement. Persistent bilateral pleural effusions and overlying bibasilar atelectasis. Mild central vascular congestion but no overt pulmonary edema. IMPRESSION: Persistent cardiac enlargement, bilateral pleural effusions and overlying bibasilar atelectasis. Electronically Signed   By: Marijo Sanes M.D.   On: 11/26/2022 06:51      Labs: BNP (last 3 results) Recent Labs    09/17/22 0143 10/24/22 1530 11/02/22 0239  BNP 2,032.4* >4,500.0* 7,619.5*   Basic Metabolic Panel: Recent Labs  Lab 12/18/22 0844 12/21/22 1332 12/22/22 0537  NA 137 138 136  K 5.1 5.0 4.9  CL 101 100 99  CO2  24 23 26   GLUCOSE 82 105* 87  BUN 88* 89* 62*  CREATININE 6.86* 6.97* 5.38*  CALCIUM 9.3 9.1 9.4  PHOS 6.3* 5.7*  --    Liver Function Tests: Recent Labs  Lab 12/18/22 0844 12/21/22 1332  ALBUMIN 2.4* 2.4*   No results for input(s): "LIPASE", "AMYLASE" in the last 168 hours. No results for input(s): "AMMONIA" in the last 168 hours. CBC: Recent Labs  Lab 12/18/22 0844 12/21/22 1332 12/22/22 0537  WBC 8.6 8.2 8.1  HGB 7.1* 6.6* 7.9*  HCT 23.6* 21.7* 25.8*  MCV 94.4 93.9 92.1  PLT 176 208 230   Cardiac Enzymes: No results for input(s): "CKTOTAL", "CKMB", "CKMBINDEX", "TROPONINI" in the last 168 hours. BNP: Invalid input(s): "POCBNP" CBG: No results for input(s): "GLUCAP" in the last 168  hours. D-Dimer No results for input(s): "DDIMER" in the last 72 hours. Hgb A1c No results for input(s): "HGBA1C" in the last 72 hours. Lipid Profile No results for input(s): "CHOL", "HDL", "LDLCALC", "TRIG", "CHOLHDL", "LDLDIRECT" in the last 72 hours. Thyroid function studies No results for input(s): "TSH", "T4TOTAL", "T3FREE", "THYROIDAB" in the last 72 hours.  Invalid input(s): "FREET3" Anemia work up No results for input(s): "VITAMINB12", "FOLATE", "FERRITIN", "TIBC", "IRON", "RETICCTPCT" in the last 72 hours. Urinalysis    Component Value Date/Time   COLORURINE YELLOW 06/02/2021 1221   APPEARANCEUR CLOUDY (A) 06/02/2021 1221   LABSPEC 1.010 06/02/2021 1221   PHURINE 7.0 06/02/2021 1221   GLUCOSEU 50 (A) 06/02/2021 1221   HGBUR NEGATIVE 06/02/2021 1221   BILIRUBINUR NEGATIVE 06/02/2021 1221   BILIRUBINUR negative 01/30/2020 1526   BILIRUBINUR neg 09/15/2017 1657   KETONESUR NEGATIVE 06/02/2021 1221   PROTEINUR 100 (A) 06/02/2021 1221   UROBILINOGEN 0.2 01/30/2020 1526   NITRITE NEGATIVE 06/02/2021 1221   LEUKOCYTESUR NEGATIVE 06/02/2021 1221   Sepsis Labs Recent Labs  Lab 12/18/22 0844 12/21/22 1332 12/22/22 0537  WBC 8.6 8.2 8.1   Microbiology No results found for this or any previous visit (from the past 240 hour(s)).   No charge note.    Enzo Bi, MD  [facility] 12/24/2022, 3:39 PM

## 2022-12-24 NOTE — Telephone Encounter (Signed)
Transition Care Management Unsuccessful Follow-up Telephone Call  Date of discharge and from where:  12/22/2022, [facility]  Attempts:  2nd Attempt  Reason for unsuccessful TCM follow-up call:  Unable to reach patient  [phone], the recording stated that the call cannot be completed as dialed.  I also called [phone] and the phone just rings, no option to leave a message.    Per the congregational nurse's note from yesterday, 12/23/2022, , the patient is going to [facility].

## 2022-12-24 NOTE — Discharge Planning (Signed)
Patient left AMA before I could get confirmation on clinical acceptance. I did receive notice from [facility] admission on 12/23/22 which stated that they did not have a nephrologist willing to round on patient, therefore patient was denied clinical acceptance. Spoke with [facility] admissions today 12/24/22, which informed that clinical acceptance was still pending for the [facility] however where requesting more documentation on patient. It was explained that patient was no longer admitted, therefore additional records can not be obtained. [facility] admissions stated they would follow up tomorrow morning with clinic to see if patient has been accepted and follow up with patient.

## 2023-04-06 ENCOUNTER — Other Ambulatory Visit (HOSPITAL_COMMUNITY): Payer: Self-pay
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
